# Patient Record
Sex: Female | Born: 1955
Health system: Southern US, Community
[De-identification: ages and names within clinical notes are randomized; demographics above are authoritative.]

## PROBLEM LIST (undated history)

## (undated) DIAGNOSIS — I10 Essential (primary) hypertension: Secondary | ICD-10-CM

## (undated) DIAGNOSIS — K449 Diaphragmatic hernia without obstruction or gangrene: Secondary | ICD-10-CM

## (undated) DIAGNOSIS — K219 Gastro-esophageal reflux disease without esophagitis: Secondary | ICD-10-CM

## (undated) DIAGNOSIS — K760 Fatty (change of) liver, not elsewhere classified: Secondary | ICD-10-CM

## (undated) DIAGNOSIS — E559 Vitamin D deficiency, unspecified: Secondary | ICD-10-CM

## (undated) DIAGNOSIS — M069 Rheumatoid arthritis, unspecified: Secondary | ICD-10-CM

## (undated) DIAGNOSIS — N200 Calculus of kidney: Secondary | ICD-10-CM

## (undated) DIAGNOSIS — N189 Chronic kidney disease, unspecified: Secondary | ICD-10-CM

## (undated) DIAGNOSIS — E785 Hyperlipidemia, unspecified: Secondary | ICD-10-CM

## (undated) DIAGNOSIS — K2970 Gastritis, unspecified, without bleeding: Secondary | ICD-10-CM

## (undated) DIAGNOSIS — R12 Heartburn: Secondary | ICD-10-CM

## (undated) DIAGNOSIS — M2550 Pain in unspecified joint: Secondary | ICD-10-CM

## (undated) DIAGNOSIS — E119 Type 2 diabetes mellitus without complications: Secondary | ICD-10-CM

## (undated) DIAGNOSIS — K3184 Gastroparesis: Secondary | ICD-10-CM

## (undated) HISTORY — DX: Pain in unspecified joint: M25.50

## (undated) HISTORY — PX: TUBAL LIGATION: SHX77

## (undated) HISTORY — DX: Gastritis, unspecified, without bleeding: K29.70

## (undated) HISTORY — DX: Gastro-esophageal reflux disease without esophagitis: K21.9

## (undated) HISTORY — PX: UPPER GASTROINTESTINAL ENDOSCOPY: SHX188

## (undated) HISTORY — DX: Heartburn: R12

## (undated) HISTORY — DX: Diaphragmatic hernia without obstruction or gangrene: K44.9

## (undated) HISTORY — PX: CHOLECYSTECTOMY: SHX55

## (undated) HISTORY — DX: Rheumatoid arthritis, unspecified: M06.9

## (undated) HISTORY — DX: Type 2 diabetes mellitus without complications: E11.9

## (undated) HISTORY — DX: Gastroparesis: K31.84

## (undated) HISTORY — DX: Fatty (change of) liver, not elsewhere classified: K76.0

## (undated) HISTORY — DX: Hyperlipidemia, unspecified: E78.5

## (undated) HISTORY — DX: Vitamin D deficiency, unspecified: E55.9

## (undated) HISTORY — DX: Essential (primary) hypertension: I10

## (undated) HISTORY — DX: Calculus of kidney: N20.0

## (undated) HISTORY — PX: COLONOSCOPY: SHX174

---

## 1998-04-26 ENCOUNTER — Emergency Department (HOSPITAL_COMMUNITY): Admission: EM | Admit: 1998-04-26 | Discharge: 1998-04-26 | Payer: Self-pay | Admitting: Emergency Medicine

## 1998-06-20 ENCOUNTER — Emergency Department (HOSPITAL_COMMUNITY): Admission: EM | Admit: 1998-06-20 | Discharge: 1998-06-20 | Payer: Self-pay | Admitting: Emergency Medicine

## 1998-06-20 ENCOUNTER — Encounter: Payer: Self-pay | Admitting: Emergency Medicine

## 1998-11-07 ENCOUNTER — Emergency Department (HOSPITAL_COMMUNITY): Admission: EM | Admit: 1998-11-07 | Discharge: 1998-11-07 | Payer: Self-pay | Admitting: Internal Medicine

## 1998-12-04 ENCOUNTER — Other Ambulatory Visit: Admission: RE | Admit: 1998-12-04 | Discharge: 1998-12-04 | Payer: Self-pay | Admitting: *Deleted

## 1999-01-27 ENCOUNTER — Ambulatory Visit (HOSPITAL_COMMUNITY): Admission: RE | Admit: 1999-01-27 | Discharge: 1999-01-27 | Payer: Self-pay | Admitting: Gastroenterology

## 1999-03-14 ENCOUNTER — Emergency Department (HOSPITAL_COMMUNITY): Admission: EM | Admit: 1999-03-14 | Discharge: 1999-03-14 | Payer: Self-pay | Admitting: Emergency Medicine

## 1999-03-24 ENCOUNTER — Other Ambulatory Visit: Admission: RE | Admit: 1999-03-24 | Discharge: 1999-03-24 | Payer: Self-pay | Admitting: *Deleted

## 1999-04-23 ENCOUNTER — Encounter: Admission: RE | Admit: 1999-04-23 | Discharge: 1999-04-23 | Payer: Self-pay | Admitting: *Deleted

## 2000-04-25 ENCOUNTER — Encounter: Admission: RE | Admit: 2000-04-25 | Discharge: 2000-04-25 | Payer: Self-pay | Admitting: *Deleted

## 2000-08-03 ENCOUNTER — Encounter: Admission: RE | Admit: 2000-08-03 | Discharge: 2000-11-01 | Payer: Self-pay | Admitting: Internal Medicine

## 2000-08-18 ENCOUNTER — Other Ambulatory Visit: Admission: RE | Admit: 2000-08-18 | Discharge: 2000-08-18 | Payer: Self-pay | Admitting: *Deleted

## 2000-10-28 ENCOUNTER — Emergency Department (HOSPITAL_COMMUNITY): Admission: EM | Admit: 2000-10-28 | Discharge: 2000-10-28 | Payer: Self-pay | Admitting: Emergency Medicine

## 2000-10-28 ENCOUNTER — Encounter: Payer: Self-pay | Admitting: Emergency Medicine

## 2001-04-27 ENCOUNTER — Encounter: Admission: RE | Admit: 2001-04-27 | Discharge: 2001-04-27 | Payer: Self-pay | Admitting: *Deleted

## 2001-09-26 ENCOUNTER — Other Ambulatory Visit: Admission: RE | Admit: 2001-09-26 | Discharge: 2001-09-26 | Payer: Self-pay | Admitting: *Deleted

## 2001-10-30 ENCOUNTER — Ambulatory Visit (HOSPITAL_BASED_OUTPATIENT_CLINIC_OR_DEPARTMENT_OTHER): Admission: RE | Admit: 2001-10-30 | Discharge: 2001-10-30 | Payer: Self-pay | Admitting: *Deleted

## 2001-11-06 ENCOUNTER — Encounter (INDEPENDENT_AMBULATORY_CARE_PROVIDER_SITE_OTHER): Payer: Self-pay | Admitting: *Deleted

## 2001-11-06 ENCOUNTER — Ambulatory Visit (HOSPITAL_COMMUNITY): Admission: RE | Admit: 2001-11-06 | Discharge: 2001-11-06 | Payer: Self-pay | Admitting: *Deleted

## 2001-11-29 ENCOUNTER — Encounter: Payer: Self-pay | Admitting: Emergency Medicine

## 2001-11-29 ENCOUNTER — Emergency Department (HOSPITAL_COMMUNITY): Admission: EM | Admit: 2001-11-29 | Discharge: 2001-11-29 | Payer: Self-pay | Admitting: Emergency Medicine

## 2002-03-30 ENCOUNTER — Encounter: Admission: RE | Admit: 2002-03-30 | Discharge: 2002-03-30 | Payer: Self-pay | Admitting: Family Medicine

## 2002-06-07 ENCOUNTER — Ambulatory Visit (HOSPITAL_COMMUNITY): Admission: RE | Admit: 2002-06-07 | Discharge: 2002-06-07 | Payer: Self-pay | Admitting: *Deleted

## 2002-11-05 ENCOUNTER — Encounter (INDEPENDENT_AMBULATORY_CARE_PROVIDER_SITE_OTHER): Payer: Self-pay | Admitting: Specialist

## 2002-11-05 ENCOUNTER — Encounter: Payer: Self-pay | Admitting: Surgery

## 2002-11-05 ENCOUNTER — Observation Stay (HOSPITAL_COMMUNITY): Admission: RE | Admit: 2002-11-05 | Discharge: 2002-11-06 | Payer: Self-pay | Admitting: Surgery

## 2003-01-31 ENCOUNTER — Encounter: Admission: RE | Admit: 2003-01-31 | Discharge: 2003-01-31 | Payer: Self-pay | Admitting: Sports Medicine

## 2003-05-15 ENCOUNTER — Encounter: Admission: RE | Admit: 2003-05-15 | Discharge: 2003-05-15 | Payer: Self-pay | Admitting: Family Medicine

## 2003-05-17 ENCOUNTER — Emergency Department (HOSPITAL_COMMUNITY): Admission: EM | Admit: 2003-05-17 | Discharge: 2003-05-18 | Payer: Self-pay | Admitting: Emergency Medicine

## 2003-05-31 ENCOUNTER — Emergency Department (HOSPITAL_COMMUNITY): Admission: EM | Admit: 2003-05-31 | Discharge: 2003-06-01 | Payer: Self-pay | Admitting: Emergency Medicine

## 2003-08-08 ENCOUNTER — Ambulatory Visit (HOSPITAL_COMMUNITY): Admission: RE | Admit: 2003-08-08 | Discharge: 2003-08-08 | Payer: Self-pay | Admitting: *Deleted

## 2003-11-25 ENCOUNTER — Emergency Department (HOSPITAL_COMMUNITY): Admission: EM | Admit: 2003-11-25 | Discharge: 2003-11-25 | Payer: Self-pay | Admitting: Emergency Medicine

## 2003-12-18 ENCOUNTER — Encounter: Payer: Self-pay | Admitting: Gastroenterology

## 2004-10-07 ENCOUNTER — Ambulatory Visit (HOSPITAL_COMMUNITY): Admission: RE | Admit: 2004-10-07 | Discharge: 2004-10-07 | Payer: Self-pay | Admitting: *Deleted

## 2004-10-15 ENCOUNTER — Encounter: Admission: RE | Admit: 2004-10-15 | Discharge: 2004-10-15 | Payer: Self-pay | Admitting: *Deleted

## 2004-11-12 ENCOUNTER — Ambulatory Visit: Payer: Self-pay | Admitting: Sports Medicine

## 2005-06-20 ENCOUNTER — Encounter (INDEPENDENT_AMBULATORY_CARE_PROVIDER_SITE_OTHER): Payer: Self-pay | Admitting: *Deleted

## 2005-06-20 LAB — CONVERTED CEMR LAB

## 2005-09-07 ENCOUNTER — Ambulatory Visit: Payer: Self-pay | Admitting: Sports Medicine

## 2005-09-23 ENCOUNTER — Ambulatory Visit: Payer: Self-pay | Admitting: Gastroenterology

## 2005-10-04 ENCOUNTER — Ambulatory Visit: Payer: Self-pay | Admitting: Gastroenterology

## 2005-10-04 ENCOUNTER — Encounter (INDEPENDENT_AMBULATORY_CARE_PROVIDER_SITE_OTHER): Payer: Self-pay | Admitting: Specialist

## 2005-11-01 ENCOUNTER — Ambulatory Visit (HOSPITAL_COMMUNITY): Admission: RE | Admit: 2005-11-01 | Discharge: 2005-11-01 | Payer: Self-pay | Admitting: *Deleted

## 2005-12-10 ENCOUNTER — Ambulatory Visit: Payer: Self-pay | Admitting: Gastroenterology

## 2005-12-28 ENCOUNTER — Encounter: Payer: Self-pay | Admitting: Gastroenterology

## 2005-12-28 ENCOUNTER — Ambulatory Visit (HOSPITAL_COMMUNITY): Admission: RE | Admit: 2005-12-28 | Discharge: 2005-12-28 | Payer: Self-pay | Admitting: Gastroenterology

## 2006-03-25 ENCOUNTER — Encounter (HOSPITAL_COMMUNITY): Admission: RE | Admit: 2006-03-25 | Discharge: 2006-05-31 | Payer: Self-pay | Admitting: Cardiology

## 2006-04-11 ENCOUNTER — Ambulatory Visit (HOSPITAL_COMMUNITY): Admission: RE | Admit: 2006-04-11 | Discharge: 2006-04-11 | Payer: Self-pay | Admitting: Cardiology

## 2006-04-11 ENCOUNTER — Encounter: Admission: RE | Admit: 2006-04-11 | Discharge: 2006-04-11 | Payer: Self-pay | Admitting: Cardiology

## 2006-05-19 DIAGNOSIS — K219 Gastro-esophageal reflux disease without esophagitis: Secondary | ICD-10-CM | POA: Insufficient documentation

## 2006-05-19 DIAGNOSIS — N912 Amenorrhea, unspecified: Secondary | ICD-10-CM | POA: Insufficient documentation

## 2006-05-19 DIAGNOSIS — I10 Essential (primary) hypertension: Secondary | ICD-10-CM | POA: Insufficient documentation

## 2006-05-19 DIAGNOSIS — N182 Chronic kidney disease, stage 2 (mild): Secondary | ICD-10-CM | POA: Insufficient documentation

## 2006-05-19 DIAGNOSIS — E1169 Type 2 diabetes mellitus with other specified complication: Secondary | ICD-10-CM | POA: Insufficient documentation

## 2006-05-19 DIAGNOSIS — E785 Hyperlipidemia, unspecified: Secondary | ICD-10-CM

## 2006-05-19 DIAGNOSIS — E1122 Type 2 diabetes mellitus with diabetic chronic kidney disease: Secondary | ICD-10-CM | POA: Insufficient documentation

## 2006-05-19 DIAGNOSIS — E119 Type 2 diabetes mellitus without complications: Secondary | ICD-10-CM | POA: Insufficient documentation

## 2006-05-20 ENCOUNTER — Encounter (INDEPENDENT_AMBULATORY_CARE_PROVIDER_SITE_OTHER): Payer: Self-pay | Admitting: *Deleted

## 2006-11-07 ENCOUNTER — Ambulatory Visit (HOSPITAL_COMMUNITY): Admission: RE | Admit: 2006-11-07 | Discharge: 2006-11-07 | Payer: Self-pay | Admitting: *Deleted

## 2007-08-30 ENCOUNTER — Emergency Department (HOSPITAL_COMMUNITY): Admission: EM | Admit: 2007-08-30 | Discharge: 2007-08-31 | Payer: Self-pay | Admitting: Emergency Medicine

## 2007-09-04 ENCOUNTER — Emergency Department (HOSPITAL_COMMUNITY): Admission: EM | Admit: 2007-09-04 | Discharge: 2007-09-04 | Payer: Self-pay | Admitting: Emergency Medicine

## 2007-09-12 ENCOUNTER — Inpatient Hospital Stay (HOSPITAL_COMMUNITY): Admission: EM | Admit: 2007-09-12 | Discharge: 2007-09-15 | Payer: Self-pay | Admitting: Emergency Medicine

## 2007-09-12 ENCOUNTER — Encounter (INDEPENDENT_AMBULATORY_CARE_PROVIDER_SITE_OTHER): Payer: Self-pay | Admitting: Internal Medicine

## 2007-11-21 ENCOUNTER — Ambulatory Visit (HOSPITAL_COMMUNITY): Admission: RE | Admit: 2007-11-21 | Discharge: 2007-11-21 | Payer: Self-pay | Admitting: Obstetrics & Gynecology

## 2008-03-11 ENCOUNTER — Emergency Department (HOSPITAL_COMMUNITY): Admission: EM | Admit: 2008-03-11 | Discharge: 2008-03-11 | Payer: Self-pay | Admitting: Emergency Medicine

## 2008-03-17 ENCOUNTER — Emergency Department (HOSPITAL_COMMUNITY): Admission: EM | Admit: 2008-03-17 | Discharge: 2008-03-17 | Payer: Self-pay | Admitting: Emergency Medicine

## 2008-04-22 DIAGNOSIS — N200 Calculus of kidney: Secondary | ICD-10-CM | POA: Insufficient documentation

## 2008-04-23 ENCOUNTER — Ambulatory Visit: Payer: Self-pay | Admitting: Gastroenterology

## 2008-04-23 DIAGNOSIS — R112 Nausea with vomiting, unspecified: Secondary | ICD-10-CM | POA: Insufficient documentation

## 2008-04-23 DIAGNOSIS — R1013 Epigastric pain: Secondary | ICD-10-CM | POA: Insufficient documentation

## 2008-04-23 LAB — CONVERTED CEMR LAB
ALT: 65 units/L — ABNORMAL HIGH (ref 0–35)
AST: 54 units/L — ABNORMAL HIGH (ref 0–37)
Albumin: 3.8 g/dL (ref 3.5–5.2)
Alkaline Phosphatase: 94 units/L (ref 39–117)
Amylase: 64 units/L (ref 27–131)
BUN: 10 mg/dL (ref 6–23)
Basophils Absolute: 0 10*3/uL (ref 0.0–0.1)
Basophils Relative: 0.6 % (ref 0.0–3.0)
Bilirubin, Direct: 0.1 mg/dL (ref 0.0–0.3)
CO2: 30 meq/L (ref 19–32)
Calcium: 9.7 mg/dL (ref 8.4–10.5)
Chloride: 103 meq/L (ref 96–112)
Creatinine, Ser: 0.6 mg/dL (ref 0.4–1.2)
Eosinophils Absolute: 0.1 10*3/uL (ref 0.0–0.7)
Eosinophils Relative: 2.5 % (ref 0.0–5.0)
Ferritin: 65.1 ng/mL (ref 10.0–291.0)
Folate: 11.3 ng/mL
GFR calc Af Amer: 134 mL/min
GFR calc non Af Amer: 111 mL/min
Glucose, Bld: 226 mg/dL — ABNORMAL HIGH (ref 70–99)
HCT: 35.9 % — ABNORMAL LOW (ref 36.0–46.0)
Hemoglobin: 12.1 g/dL (ref 12.0–15.0)
Hgb A1c MFr Bld: 9.1 % — ABNORMAL HIGH (ref 4.6–6.0)
Iron: 84 ug/dL (ref 42–145)
Lipase: 38 units/L (ref 11.0–59.0)
Lymphocytes Relative: 44.7 % (ref 12.0–46.0)
MCHC: 33.6 g/dL (ref 30.0–36.0)
MCV: 88.1 fL (ref 78.0–100.0)
Monocytes Absolute: 0.4 10*3/uL (ref 0.1–1.0)
Monocytes Relative: 10.2 % (ref 3.0–12.0)
Neutro Abs: 1.8 10*3/uL (ref 1.4–7.7)
Neutrophils Relative %: 42 % — ABNORMAL LOW (ref 43.0–77.0)
Platelets: 277 10*3/uL (ref 150–400)
Potassium: 4.2 meq/L (ref 3.5–5.1)
RBC: 4.07 M/uL (ref 3.87–5.11)
RDW: 13.8 % (ref 11.5–14.6)
Saturation Ratios: 18.7 % — ABNORMAL LOW (ref 20.0–50.0)
Sodium: 139 meq/L (ref 135–145)
TSH: 1.2 microintl units/mL (ref 0.35–5.50)
Total Bilirubin: 0.4 mg/dL (ref 0.3–1.2)
Total Protein: 7.4 g/dL (ref 6.0–8.3)
Transferrin: 321.5 mg/dL (ref >=212.0)
Vitamin B-12: 493 pg/mL (ref 211–911)
WBC: 4.2 10*3/uL — ABNORMAL LOW (ref 4.5–10.5)

## 2008-04-29 ENCOUNTER — Ambulatory Visit (HOSPITAL_COMMUNITY): Admission: RE | Admit: 2008-04-29 | Discharge: 2008-04-29 | Payer: Self-pay | Admitting: Gastroenterology

## 2008-05-07 ENCOUNTER — Ambulatory Visit: Payer: Self-pay | Admitting: Gastroenterology

## 2008-05-07 DIAGNOSIS — K7689 Other specified diseases of liver: Secondary | ICD-10-CM | POA: Insufficient documentation

## 2008-05-11 ENCOUNTER — Emergency Department (HOSPITAL_COMMUNITY): Admission: EM | Admit: 2008-05-11 | Discharge: 2008-05-11 | Payer: Self-pay | Admitting: Emergency Medicine

## 2008-08-20 ENCOUNTER — Ambulatory Visit: Payer: Self-pay | Admitting: Gastroenterology

## 2008-08-21 ENCOUNTER — Telehealth: Payer: Self-pay | Admitting: Gastroenterology

## 2008-08-27 ENCOUNTER — Ambulatory Visit: Payer: Self-pay | Admitting: Gastroenterology

## 2008-08-27 DIAGNOSIS — K5900 Constipation, unspecified: Secondary | ICD-10-CM | POA: Insufficient documentation

## 2008-12-10 ENCOUNTER — Ambulatory Visit (HOSPITAL_COMMUNITY): Admission: RE | Admit: 2008-12-10 | Discharge: 2008-12-10 | Payer: Self-pay | Admitting: Internal Medicine

## 2008-12-17 ENCOUNTER — Encounter: Admission: RE | Admit: 2008-12-17 | Discharge: 2008-12-17 | Payer: Self-pay | Admitting: Internal Medicine

## 2009-04-27 ENCOUNTER — Emergency Department (HOSPITAL_COMMUNITY): Admission: EM | Admit: 2009-04-27 | Discharge: 2009-04-28 | Payer: Self-pay | Admitting: Emergency Medicine

## 2010-03-26 ENCOUNTER — Ambulatory Visit (HOSPITAL_COMMUNITY)
Admission: RE | Admit: 2010-03-26 | Discharge: 2010-03-26 | Payer: Self-pay | Source: Home / Self Care | Attending: Internal Medicine | Admitting: Internal Medicine

## 2010-04-12 ENCOUNTER — Encounter: Payer: Self-pay | Admitting: Cardiology

## 2010-04-21 NOTE — Procedures (Signed)
Summary: Colon   Colonoscopy  Procedure date:  12/18/2003  Findings:      Location:  [facility].    Colonoscopy  Procedure date:  12/18/2003  Findings:      Location:  [facility].    Patient Name: Leslie Davis, Leslie Davis. MRN:  Procedure Procedures: Colonoscopy CPT: [phone].  Personnel: Endoscopist: Vania Rea. Jarold Motto, MD.  Exam Location: Exam performed in Outpatient Clinic. Outpatient  Patient Consent: Procedure, Alternatives, Risks and Benefits discussed, consent obtained, from patient. Consent was obtained by the RN.  Indications  Average Risk Screening Routine.  History  Current Medications: Patient is not currently taking Coumadin.  Pre-Exam Physical: Performed Dec 18, 2003. Cardio-pulmonary exam, Rectal exam, Abdominal exam, Extremity exam, Mental status exam WNL.  Exam Exam: Extent of exam reached: Cecum, extent intended: Cecum.  The cecum was identified by appendiceal orifice and IC valve. Patient position: on left side. Duration of exam: 20 minutes. Colon retroflexion performed. Images taken. ASA Classification: I. Tolerance: excellent.  Monitoring: Pulse and BP monitoring, Oximetry used. Supplemental O2 given. at 2 Liters.  Colon Prep Used Golytely for colon prep. Prep results: excellent.  Sedation Meds: Patient assessed and found to be appropriate for moderate (conscious) sedation. Fentanyl 50 mcg. given IV. Versed 5 mg. given IV.  Instrument(s): CF 140L. Serial [id number].  Findings - NORMAL EXAM: Cecum to Rectum. Not Seen: Polyps. AVM's. Colitis. Tumors. Melanosis. Crohn's. Diverticulosis. Hemorrhoids.   Assessment Normal examination.  Events  Unplanned Interventions: No intervention was required.  Plans Medication Plan: Continue current medications.  Patient Education: Patient given standard instructions for: a normal exam.  Disposition: After procedure patient sent to recovery. After recovery patient sent  home.  Scheduling/Referral: Follow-Up prn.    CC: Guerry Bruin, MD  This report was created from the original endoscopy report, which was reviewed and signed by the above listed endoscopist.

## 2010-04-21 NOTE — Assessment & Plan Note (Signed)
Summary: constipation,probs w stomach...em   History of Present Illness Visit Type: follow up Primary GI MD: Sheryn Bison MD FACP FAGA Primary Provider: Velna Hatchet, MD Chief Complaint: Constipation with lower abd cramping x 3 weeks. History of Present Illness:   This patient is a 55 year old African American female with non-insulin-dependent diabetes who complains of abdominal gas, bloating, and new onset constipation. She also has chronic epigastric postprandial discomfort and has had negative endoscopic exams and recently a normal gastric emptying scan. She is status post cholecystectomy. She denies any specific hepatobiliary complaints.  The last 3 weeks she's had gas, bloating, and hard nonbloody stools. She denies any new medications, worsening diabetes, use of narcotics, or change in her diet. She does have chronic GERD and is on chronic AcipHex. 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   GI Review of Systems    Reports belching and  nausea.      Denies abdominal pain, acid reflux, bloating, chest pain, dysphagia with liquids, dysphagia with solids, heartburn, loss of appetite, vomiting, vomiting blood, weight loss, and  weight gain.      Reports constipation.     Denies anal fissure, black tarry stools, change in bowel habit, diarrhea, diverticulosis, fecal incontinence, heme positive stool, hemorrhoids, irritable bowel syndrome, jaundice, light color stool, liver problems, rectal bleeding, and  rectal pain.    Current Medications (verified): 1)  Actos + Metformin 15/850 Mg .... Take 1 Tablet By Mouth Two Times A Day 2)  Aspirin 81 Mg Tbec (Aspirin) .Marland Kitchen.. 1 Tablet By Mouth Once Daily 3)  Januvia 100 Mg Tabs (Sitagliptin Phosphate) .... Take 1 Tablet By Mouth Once A Day 4)  Benicar Hct 40-25 Mg Tabs (Olmesartan  Medoxomil-Hctz) .... Take 1 Tablet By Mouth Once A Day 5)  Aciphex 20 Mg  Tbec (Rabeprazole Sodium) .... Take 1 Each Day 30 Minutes Before Meals  Allergies (verified): No Known Drug Allergies  Past History:  Past medical, surgical, family and social histories (including risk factors) reviewed for relevance to current acute and chronic problems.  Past Medical History: Current Problems:  CONSTIPATION (ICD-564.00) FATTY LIVER DISEASE (ICD-571.8) GASTROPARESIS (ICD-536.3) EPIGASTRIC PAIN (ICD-789.06) NAUSEA (ICD-787.02) RENAL CALCULUS (ICD-592.0) OTHER DYSPHAGIA (ICD-787.29) GASTRITIS (ICD-535.50) HYPERTENSION, BENIGN SYSTEMIC (ICD-401.1) HYPERLIPIDEMIA (ICD-272.4) GASTROESOPHAGEAL REFLUX, NO ESOPHAGITIS (ICD-530.81) DIABETES MELLITUS II, UNCOMPLICATED (ICD-250.00) AMENORRHEA (ICD-626.0)  Past Surgical History: Reviewed history from 05/07/2008 and no changes required. She is a previous cholecystectomy for cholelithiasis.  Family History: Reviewed history from 04/23/2008 and no changes required. No FH of Colon Cancer: Family History of Diabetes: Mother, Grandmother Family History of Stomach Cancer: Maternal Aunts x 2  Social History: Reviewed history from 05/07/2008 and no changes required. married with two sons Alcohol Use - no Illicit Drug Use - no Patient is a former smoker. -smoked for less than 1 year at age 64 Patient does not get regular exercise.  Occupation: Housekeeping  Review of Systems       The patient complains of fatigue.  The patient denies allergy/sinus, anemia, anxiety-new, arthritis/joint pain, back pain, blood in urine, breast changes/lumps, change in vision, confusion, cough, coughing up blood, depression-new, fainting, fever, headaches-new, hearing problems, heart murmur, heart rhythm changes, itching, menstrual pain, muscle pains/cramps, night sweats, nosebleeds, pregnancy symptoms, shortness of breath, skin rash, sleeping problems, sore throat,  swelling of feet/legs, swollen lymph glands, thirst - excessive ,  urination - excessive , urination changes/pain, urine leakage, vision changes, and voice change.    Vital Signs:  Patient profile:   55 year old female Height:      64 inches Weight:      197 pounds BMI:     33.94 BSA:     1.95 Pulse rate:   70 / minute Pulse rhythm:   regular BP sitting:   128 / 64  (right arm) Cuff size:   regular  Vitals Entered By: Ok Anis CMA (August 27, 2008 3:10 PM)  Physical Exam  General:  Well developed, well nourished, no acute distress.obese.   Head:  Normocephalic and atraumatic. Eyes:  PERRLA, no icterus.exam deferred to patient's ophthalmologist.   Neck:  Supple; no masses or thyromegaly. Lungs:  Clear throughout to auscultation. Heart:  Regular rate and rhythm; no murmurs, rubs,  or bruits. Abdomen:  Soft, nontender and nondistended. No masses, hepatosplenomegaly or hernias noted. Normal bowel sounds.obese.   Rectal:  Normal exam.hemocult negative.  There is no evidence of an impaction, stool is normal color and guaiac-negative. Extremities:  No clubbing, cyanosis, edema or deformities noted. Neurologic:  Alert and  oriented x4;  grossly normal neurologically. Cervical Nodes:  No significant cervical adenopathy. Inguinal Nodes:  No significant inguinal adenopathy. Psych:  Alert and cooperative. Normal mood and affect.   Impression & Recommendations:  Problem # 1:  CONSTIPATION (ICD-564.00) Assessment New This problem allegedly has occurred in the last 3-4 weeks, etiology unclear. She does complain of some numbness and tingling in her hands and feet but otherwise has no known diabetic complications. I suspect she has standard functional constipation related to lack of fiber and fluids in her diet. I have asked her to try Amitiza 24 micrograms twice a day with MiraLax 8 ounces at bedtime as needed. She is up-to-date on colonoscopy exams. I see no need to repeat her labs at this  point. She is to call for progress report in one week's time.  Problem # 2:  FATTY LIVER DISEASE (ICD-571.8) Assessment: Unchanged Attempts at weight loss reduction had been unsuccessful. We will check her liver function tests at 6 month intervals. She has no evidence of cirrhosis at this time. 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 She is status post cholecystectomy for cholelithiasis. Recent gastric emptying scan was normal.  Problem # 4:  NAUSEA (ICD-787.02) Assessment: Unchanged Probably related to her metabolic syndrome, obesity, and Nash syndrome.  Problem # 5:  DIABETES MELLITUS II, UNCOMPLICATED (ICD-250.00) Assessment: Unchanged continue meds per primary care physician Dr. Skip Mayer.  Patient Instructions: 1)  Copy sent to : Dr. Skip Mayer 2)  Trial of Amitiza 24 micrograms twice a day 3)  Diet should be high in fiber ( fruits, vegetables, whole grains) but low in residue. Drink at least eight (8) glasses of water a day.  4)  MiraLax 8 ounces at bedtime as needed 5)  Please continue current medications.  6)  Please call our GI Office back in 2 weeks with a follow-up of symptoms.  Appended Document: constipation,probs w stomach...em    Clinical Lists Changes  Medications: Added new medication of AMITIZA 24 MCG  CAPS (LUBIPROSTONE) 1 two times a day/take with food and water Added new medication of MIRALAX   POWD (POLYETHYLENE GLYCOL 3350) take one capful in 8 ounces of water at bedtime

## 2010-04-21 NOTE — Progress Notes (Signed)
Summary: constipation  Phone Note Call from Patient Call back at [phone]   Caller: Patient Call For: Dr. Jarold Motto Reason for Call: Talk to Nurse Details for Reason: constipation Summary of Call: pt reporting constipation for about 2 weeks and would like to know what else she can do other than otc meds Initial call taken by: Vallarie Mare,  August 21, 2008 2:27 PM  Follow-up for Phone Call        left message with female who thought she had an appt today, i asked for him to have her call me back. Follow-up by: Harlow Mares CMA,  August 21, 2008 2:31 PM  Additional Follow-up for Phone Call Additional follow up Details #1::        Left message on patients machine to call back.  advised patient husband if she is stil having problems to have her call our office back. Additional Follow-up by: Harlow Mares CMA,  August 23, 2008 8:32 AM

## 2010-04-21 NOTE — Assessment & Plan Note (Signed)
Summary: NAUSEA...AS.   History of Present Illness Visit Type: follow up Primary GI MD: Sheryn Bison MD Faith Rogue Primary Provider: Allyne Gee Chief Complaint: Patient here for further evaluation of several months nausea.  Patient states that she is nauseated on a daily basis.  Nausea occurs frequently throughout the day but on an intermittent basis.  Patient denies any vomiting or fever.  She also denies any dysphagia or odyophagia, no heartburn.  Patient does c/o some epigastric pain at times.  She also notes that she has had some occasional dark black stool History of Present Illness:   This patient is a 55 year old African American female with adult onset non-insulin-dependent diabetes referred by Dr. Allyne Gee for evaluation of chronic nausea.  I have seen this patient for many years and she has chronic nausea, which has responded in the past to PPI therapy for acid control. She is status post cholecystectomy for cholelithiasis. She's had gastric emptying scans which have been normal and not confirmed gastroparesis. However, she currently continues with early satiety, nausea, and right upper quadrant pain. She denies emesis, reflux symptoms, or dysphasia or any specific hepatobiliary or lower bowel complaints. She denies abuse of NSAIDs, alcohol, or cigarettes. Theer has been no anorexia, weight loss, fever or chills. She is not on PPI therapy at this time. Last endoscopic exam was in July 2007 and colonoscopy in 2005.     GI Review of Systems    Reports abdominal pain and  nausea.     Location of  Abdominal pain: RUQ.    Denies acid reflux, belching, bloating, chest pain, dysphagia with liquids, dysphagia with solids, heartburn, loss of appetite, vomiting, vomiting blood, weight loss, and  weight gain.        Denies anal fissure, black tarry stools, change in bowel habit, constipation, diarrhea, diverticulosis, fecal incontinence, heme positive stool, hemorrhoids, irritable bowel syndrome,  jaundice, light color stool, liver problems, rectal bleeding, and  rectal pain.     Prior Medications Reviewed Using: List Brought by Patient  Updated Prior Medication List: * ACTOS + METFORMIN 15/850 MG Take 1 tablet by mouth two times a day ALTACE 10 MG CAPS (RAMIPRIL) 1 capsule by mouth once a day BUFPIRIN 325 MG TABS (ASPIRIN BUFFERED) take one by mouth once daily JANUVIA 100 MG TABS (SITAGLIPTIN PHOSPHATE) Take 1 tablet by mouth once a day BENICAR HCT 40-25 MG TABS (OLMESARTAN MEDOXOMIL-HCTZ) Take 1 tablet by mouth once a day SIMVASTATIN 40 MG TABS (SIMVASTATIN) Take 1 tablet by mouth once a day  Current Allergies (reviewed today): No known allergies   Past Medical History:    Reviewed history and no changes required:       Current Problems:        RENAL CALCULUS (ICD-592.0)       OTHER DYSPHAGIA (ICD-787.29)       GASTRITIS (ICD-535.50)       HYPERTENSION, BENIGN SYSTEMIC (ICD-401.1)       HYPERLIPIDEMIA (ICD-272.4)       GASTROESOPHAGEAL REFLUX, NO ESOPHAGITIS (ICD-530.81)       DIABETES MELLITUS II, UNCOMPLICATED (ICD-250.00)       AMENORRHEA (ICD-626.0)         Past Surgical History:    Reviewed history and no changes required:       None   Family History:    Reviewed history from 04/22/2008 and no changes required:       No FH of Colon Cancer:       Family History of Diabetes:  Mother, Grandmother       Family History of Stomach Cancer: Maternal Aunts x 2  Social History:    Reviewed history from 04/22/2008 and no changes required:       married with two sons       Alcohol Use - no       Illicit Drug Use - no       Patient is a former smoker. -smoked for less than 1 year at age 40       Patient does not get regular exercise.    Risk Factors:  Tobacco use:  quit Exercise:  no    Vital Signs:  Patient Profile:   55 Years Old Female Height:     64 inches Weight:      194.38 pounds BMI:     33.49 BSA:     1.93 Pulse rate:   72 / minute Pulse  rhythm:   regular BP sitting:   160 / 70  (left arm)  Vitals Entered By: Hortense Ramal CMA (April 23, 2008 9:43 AM)                  Physical Exam  General:     Well developed, well nourished, no acute distress.healthy appearing.   Head:     Normocephalic and atraumatic. Eyes:     PERRLA, no icterus.exam deferred to patient's ophthalmologist.   Lungs:     Clear throughout to auscultation. Heart:     Regular rate and rhythm; no murmurs, rubs,  or bruits. Abdomen:     Soft, nontender and nondistended. No masses, hepatosplenomegaly or hernias noted. Normal bowel sounds.There is a secussion splash noted in the epigastric area with rather vigorous shaking of her upper body. Exam shows an enlarged liver with a tender edge in the subxiphoid area reproduce her abdominal pain. There is no splenomegaly, other abdominal masses or tenderness. Bowel sounds otherwise are normal. Extremities:     No clubbing, cyanosis, edema or deformities noted. Neurologic:     Alert and  oriented x4;  grossly normal neurologically. Psych:     Alert and cooperative. Normal mood and affect.    Impression & Recommendations:  Problem # 1:  NAUSEA (ICD-787.02) Assessment: Unchanged She has had chronic nausea for many years probably related to low grade gastroparesis associated with her diabetes. However, I am concerned about her tender hepatomegaly which is probably related to NASH syndrome associated with her diabetes and metabolic syndrome. We will repeat her labs and schedule upper abdominal ultrasound followup and place her on a step 3 gastroparesis diet. I will see her back in 2 weeks time for a followup. I have started her on  AcipHex 20 mg a day before breakfast since this is helped in the past. We may need to repeat her emptying scan or give her a trial of low-dose prokinetics depending on her work up and clinical course. Orders: Ultrasound Abdomen (UAS) TLB-CBC Platelet - w/Differential  (85025-CBCD) TLB-BMP (Basic Metabolic Panel-BMET) (80048-METABOL) TLB-Hepatic/Liver Function Pnl (80076-HEPATIC) TLB-TSH (Thyroid Stimulating Hormone) (84443-TSH) TLB-B12, Serum-Total ONLY (98119-J47) TLB-Ferritin (82728-FER) TLB-Folic Acid (Folate) (82746-FOL) TLB-IBC Pnl (Iron/FE;Transferrin) (83550-IBC) TLB-Amylase (82150-AMYL) TLB-Lipase (83690-LIPASE) TLB-A1C / Hgb A1C (Glycohemoglobin) (83036-A1C)   Problem # 2:  EPIGASTRIC PAIN (ICD-789.06) Assessment: Deteriorated As per above.  Orders: Ultrasound Abdomen (UAS) TLB-CBC Platelet - w/Differential (85025-CBCD) TLB-BMP (Basic Metabolic Panel-BMET) (80048-METABOL) TLB-Hepatic/Liver Function Pnl (80076-HEPATIC) TLB-TSH (Thyroid Stimulating Hormone) (84443-TSH) TLB-B12, Serum-Total ONLY (82956-O13) TLB-Ferritin (82728-FER) TLB-Folic Acid (Folate) (82746-FOL) TLB-IBC Pnl (Iron/FE;Transferrin) (83550-IBC)  TLB-Amylase (82150-AMYL) TLB-Lipase (83690-LIPASE) TLB-A1C / Hgb A1C (Glycohemoglobin) (83036-A1C)   Problem # 3:  DIABETES MELLITUS II, UNCOMPLICATED (ICD-250.00) Assessment: Unchanged Continue meds per Dr. Allyne Gee Orders: TLB-CBC Platelet - w/Differential (85025-CBCD) TLB-BMP (Basic Metabolic Panel-BMET) (80048-METABOL) TLB-Hepatic/Liver Function Pnl (80076-HEPATIC) TLB-TSH (Thyroid Stimulating Hormone) (84443-TSH) TLB-B12, Serum-Total ONLY (45409-W11) TLB-Ferritin (82728-FER) TLB-Folic Acid (Folate) (82746-FOL) TLB-IBC Pnl (Iron/FE;Transferrin) (83550-IBC) TLB-Amylase (82150-AMYL) TLB-Lipase (83690-LIPASE) TLB-A1C / Hgb A1C (Glycohemoglobin) (83036-A1C)    Patient Instructions: 1)  Copy Sent To:Dr. Allyne Gee 2)  Please Continue current medications. 3)  AcipHex 20 mg a day 4)  Gastroparesis diet step 3.  5)  Please schedule a follow-up appointment in 2 weeks. 6)  Glo Herring sent to lab today.     Prescriptions: ACIPHEX 20 MG  TBEC (RABEPRAZOLE SODIUM) Take 1 each day 30 minutes before meals  #30 x 3    Entered by:   Harlow Mares CMA   Authorized by:   Mardella Layman MD FACG,FAGA   Signed by:   Harlow Mares CMA on 04/23/2008   Method used:   Electronically to        [facility]  [phone]* (retail)       [address]       [place], [place]  [address]       Ph: [phone] or [phone]       Fax: [phone]   RxID:   [id number]

## 2010-04-21 NOTE — Procedures (Signed)
Summary: EGD and Pathology   EGD  Procedure date:  10/04/2005  Findings:      Location: [facility]    EGD  Procedure date:  10/04/2005  Findings:      Location: [facility]    Patient Name: Leslie, Davis. MRN:  Procedure Procedures: Panendoscopy (EGD) CPT: 43235.    with esophageal dilation. CPT: [id number].  Personnel: Endoscopist: Vania Rea. Jarold Motto, MD.  Exam Location: Exam performed in Outpatient Clinic. Outpatient  Patient Consent: Procedure, Alternatives, Risks and Benefits discussed, consent obtained, from patient. Consent was obtained by the RN.  Indications Symptoms: Dysphagia. Abdominal pain, location: epigastric. Reflux symptoms  History  Current Medications: Patient is not currently taking Coumadin.  Medical/Surgical History: Adult Onset Diabetes, On insulin Rx. Hypertension,  Comments: S/P cholecystectomy for gallstones.. Pre-Exam Physical: Performed Oct 04, 2005  Cardio-pulmonary exam, Abdominal exam, Extremity exam, Mental status exam WNL.  Comments: Pt. history reviewed/updated, physical exam performed prior to initiation of sedation? Exam Exam Info: Maximum depth of insertion Duodenum, intended Duodenum. Patient position: on left side. Duration of exam: 10 minutes. Vocal cords visualized. Gastric retroflexion performed. Images taken. ASA Classification: II. Tolerance: excellent.  Sedation Meds: Patient assessed and found to be appropriate for moderate (conscious) sedation. Fentanyl 25 mcg. given IV. Versed 4 mg. given IV. Cetacaine Spray 2 sprays given aerosolized.  Monitoring: BP and pulse monitoring done. Oximetry used. Supplemental O2 given at 2 Liters.  Instrument(s): GIF 160. Serial [id number].   Findings - Normal: Proximal Esophagus to Distal Esophagus. Not Seen: Tumor. Barrett's esophagus. Esophageal inflammation. Mucosal abnormality. Stricture. Varices.  - Dilation: Proximal Esophagus. for  dysphagia without stricture. Maloney dilator used, Diameter: 58 F, No Resistance, No Heme present on extraction. 1  total dilators used. Patient tolerance excellent. Outcome: successful.  - MUCOSAL ABNORMALITY: Cardia to Antrum. Nodularity present. Erythematous mucosa. Red spots present. Granular mucosa. Biopsy/Mucosal Abn taken. ICD9: Gastritis, Unspecified: 535.50.  - Normal: Antrum to Duodenal 2nd Portion. Not Seen: Ulcer. Mucosal abnormality. AVM's. Foreign body.   Assessment  Diagnoses: 535.50: Gastritis, Unspecified. R/O H.pylori.   Comments: ??? occult stricture dilated. Events  Unplanned Intervention: No unplanned interventions were required.  Plans Medication(s): Await pathology. Continue current medications.  Disposition: After procedure patient sent to recovery. After recovery patient sent home.  Scheduling: Await pathology to schedule patient. Follow-up prn.    CC: Guerry Bruin, MD  This report was created from the original endoscopy report, which was reviewed and signed by the above listed endoscopist.    [facility] Surgical Pathology - STATUS: Final             By: SMIR MD , Jessica Priest           Perform Date: 16Jul07 00:01  Ordered By: Jarold Motto MD , Areil Ottey R        Ordered Date: 17Jul07 14:11  Facility: [facility]                               Department: CPATH  Service Report Text  [facility]   [address] [address]   [place], [place] [address]   Telephone [phone] or [phone] Fax [phone]    REPORT OF SURGICAL PATHOLOGY    Case #: [id number]   Patient Name: Leslie Davis, Leslie Davis.   Office Chart Number: [id number]    MRN: [id number]   Pathologist: Havery Moros, MD   DOB[birth year]Age [birth year] (Age: 55) Gender: F  Date Taken: 10/04/2005   Date Received: 10/05/2005    FINAL DIAGNOSIS    ***MICROSCOPIC EXAMINATION AND DIAGNOSIS***    STOMACH, BIOPSY: BENIGN GASTRIC MUCOSA WITH CHRONIC GASTRITIS.   NO INTESTINAL METAPLASIA OR  MALIGNANCY IDENTIFIED.    COMMENT   A Warthin-Starry stain is performed to determine the possibility   of the presence of Helicobacter pylori. The Warthin-Starry stain   is negative for organisms of Helicobacter pylori. The control(s)   stained appropriately. (BNS:caf 10/06/05)    cf   Date Reported: 10/06/2005 Havery Moros, MD   *** Electronically Signed Out By BNS ***    Clinical information   Chronic gastritis R/O H. pylori (gt)    specimen(s) obtained   Stomach, biopsy    Gross Description   Received in formalin are tan, soft tissue fragments that are   submitted in toto. Number: 3   Size: 0.3 to 0.4 cm one block (TB:jes,10/05/05)    jes/

## 2010-04-21 NOTE — Assessment & Plan Note (Signed)
Summary: 2 WEEK RECHECK/LD   History of Present Illness Visit Type: follow up Primary GI MD: Sheryn Bison MD FACP FAGA Primary Provider: Velna Hatchet, MD Chief Complaint: 2 wk follow-up visit nausea/Abdominal US History of Present Illness:   This patient has much improved on daily AcipHex and a gastroparesis diet. 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 Her ultrasound showed an enlarged echodense liver, and she is status post cholecystectomy. She has rather poor control of her diabetes with a hemoglobin A1c of 9.1. She apparently has been on statin medication for 2 years. I do not have these records for review. Her weight is varies between 160 --190 pounds.   GI Review of Systems      Denies abdominal pain, acid reflux, belching, bloating, chest pain, dysphagia with liquids, dysphagia with solids, heartburn, loss of appetite, nausea, vomiting, vomiting blood, weight loss, and  weight gain.        Denies anal fissure, black tarry stools, change in bowel habit, constipation, diarrhea, diverticulosis, fecal incontinence, heme positive stool, hemorrhoids, irritable bowel syndrome, jaundice, light color stool, liver problems, rectal bleeding, and  rectal pain.   Updated Prior Medication List: * ACTOS + METFORMIN 15/850 MG Take 1 tablet by mouth two times a day ASPIRIN 81 MG TBEC (ASPIRIN) 1 tablet by mouth once daily JANUVIA 100 MG TABS (SITAGLIPTIN PHOSPHATE) Take 1 tablet by mouth once a day BENICAR HCT 40-25 MG TABS (OLMESARTAN MEDOXOMIL-HCTZ) Take 1 tablet by mouth once a day SIMVASTATIN 40 MG TABS (SIMVASTATIN) Take 1 tablet by mouth once a day ACIPHEX 20 MG  TBEC (RABEPRAZOLE SODIUM) Take 1 each day 30 minutes before meals  Current Allergies (reviewed today): No known allergies  Past Medical History:    Reviewed history from 04/23/2008 and no changes required:       Current Problems:        RENAL CALCULUS  (ICD-592.0)       OTHER DYSPHAGIA (ICD-787.29)       GASTRITIS (ICD-535.50)       HYPERTENSION, BENIGN SYSTEMIC (ICD-401.1)       HYPERLIPIDEMIA (ICD-272.4)       GASTROESOPHAGEAL REFLUX, NO ESOPHAGITIS (ICD-530.81)       DIABETES MELLITUS II, UNCOMPLICATED (ICD-250.00)       AMENORRHEA (ICD-626.0)         Past Surgical History:    Reviewed history from 04/23/2008 and no changes required:       She is a previous cholecystectomy for cholelithiasis.   Family History:    Reviewed history from 04/23/2008 and no changes required:       No FH of Colon Cancer:       Family History of Diabetes: Mother, Grandmother       Family History of Stomach Cancer: Maternal Aunts x 2  Social History:    Reviewed history from 04/23/2008 and no changes required:       married with two sons       Alcohol Use - no       Illicit Drug Use - no       Patient is a former smoker. -smoked for less than 1 year at age 88       Patient does not get regular exercise.        Occupation: Housekeeping  Vital Signs:  Patient Profile:   55 Years Old Female Height:     64 inches Weight:  194.50 pounds Pulse rate:   68 / minute Pulse rhythm:   regular BP sitting:   130 / 72  (left arm)  Vitals Entered By: Milford Cage CMA (May 07, 2008 8:55 AM)                  Physical Exam  General:     Well developed, well nourished, no acute distress.healthy appearing.  healthy appearing.   Psych:     Alert and cooperative. Normal mood and affect.   Impression & Recommendations:  Problem # 1:  GASTROPARESIS (ICD-536.3) Assessment: Improved She is greatly improved on a step 3 gastroparesis diet and daily AcipHex. We'll continue this regime, and at this time I have not added Reglan; She needs better control of her diabetes.  I will send these reports to Dr. Velna Hatchet.  Problem # 2:  FATTY LIVER DISEASE (ICD-571.8) Assessment: Deteriorated she appears to have possible NASH syndrome as part of her  metabolic syndrome. I have stopped her statin medications. I would recommend repeating her liver profile when she sees her primary care physician in a month. She may need liver biopsy depending on her clinical course and her laboratory parameters. Also ideally a 15-20 pound weight loss would help all of her problems tremendously.  Problem # 3:  EPIGASTRIC PAIN (ICD-789.06) Assessment: Improved   Patient Instructions: 1)  Copy Sent To:Dr. Evonnie Pat 2)  Please Continue current medications. 3)  Hold statin meds. 4)  Liver enzymes in one month her primary care with copy to GI.

## 2010-05-25 ENCOUNTER — Other Ambulatory Visit (HOSPITAL_COMMUNITY): Payer: Self-pay | Admitting: Obstetrics

## 2010-05-25 DIAGNOSIS — E2839 Other primary ovarian failure: Secondary | ICD-10-CM

## 2010-05-28 ENCOUNTER — Ambulatory Visit (HOSPITAL_COMMUNITY)
Admission: RE | Admit: 2010-05-28 | Discharge: 2010-05-28 | Disposition: A | Discharge status: Home / Self Care | Payer: 59 | Source: Ambulatory Visit | Attending: Obstetrics | Admitting: Obstetrics

## 2010-05-28 DIAGNOSIS — Z13820 Encounter for screening for osteoporosis: Secondary | ICD-10-CM | POA: Insufficient documentation

## 2010-05-28 DIAGNOSIS — E2839 Other primary ovarian failure: Secondary | ICD-10-CM

## 2010-06-12 LAB — CBC
HCT: 34 % — ABNORMAL LOW (ref 36.0–46.0)
Hemoglobin: 11.3 g/dL — ABNORMAL LOW (ref 12.0–15.0)
MCHC: 33.4 g/dL (ref 30.0–36.0)
MCV: 89.2 fL (ref 78.0–100.0)
Platelets: 296 10*3/uL (ref 150–400)
RBC: 3.81 MIL/uL — ABNORMAL LOW (ref 3.87–5.11)
RDW: 14.7 % (ref 11.5–15.5)
WBC: 8.7 10*3/uL (ref 4.0–10.5)

## 2010-06-12 LAB — TROPONIN I: Troponin I: 0.01 ng/mL (ref 0.00–0.06)

## 2010-06-12 LAB — BASIC METABOLIC PANEL
BUN: 11 mg/dL (ref 6–23)
CO2: 23 mEq/L (ref 19–32)
Calcium: 9.3 mg/dL (ref 8.4–10.5)
Chloride: 106 mEq/L (ref 96–112)
Creatinine, Ser: 0.67 mg/dL (ref 0.4–1.2)
GFR calc Af Amer: >60 mL/min (ref >60)
GFR calc non Af Amer: >60 mL/min (ref >60)
Glucose, Bld: 109 mg/dL — ABNORMAL HIGH (ref 70–99)
Potassium: 4 mEq/L (ref 3.5–5.1)
Sodium: 139 mEq/L (ref 135–145)

## 2010-06-12 LAB — GLUCOSE, CAPILLARY: Glucose-Capillary: 118 mg/dL — ABNORMAL HIGH (ref 70–99)

## 2010-07-07 LAB — GLUCOSE, CAPILLARY: Glucose-Capillary: 174 mg/dL — ABNORMAL HIGH (ref 70–99)

## 2010-08-04 NOTE — Cardiovascular Report (Signed)
NAME:  Leslie Davis, Leslie Davis           ACCOUNT NO.:  [id number]   MEDICAL RECORD NO.:  [id number]          PATIENT TYPE:  OUT   LOCATION:  CATH                         FACILITY:  [facility]   PHYSICIAN:  Thereasa Solo. Little, M.D. DATE OF BIRTH:  [birth year]   DATE OF PROCEDURE:  09/14/2007  DATE OF DISCHARGE:                            CARDIAC CATHETERIZATION   INDICATIONS:  Miedema is a 55 year old female has hypertension and  diabetes mellitus.  She was admitted with chest pain and has ruled out  for myocardial infarction and was brought for cardiac catheterization.  A distal aortogram was done at the level of the renal arteries because  of her blood pressure of 200/100 prior to starting the case.   PROCEDURE:  After obtaining informed consent, the patient was prepped  and draped in the usual sterile fashion exposing the right groin  following local anesthetic with 1% Xylocaine.  The Seldinger technique  employed a 5-French introducer sheath was placed in the right femoral  artery.  Left and right coronary arteriography, ventriculography in the  RAO projection, and a distal aortogram was performed.   A SmartNeedle was required to obtain vascular access.   COMPLICATIONS:  None.   EQUIPMENT:  5-French Judkins configuration catheters.   MEDICATIONS:  Because of her marked elevation of pressure, she was given  labetalol 20 mg IV at the beginning of the case and at the end of the  case, plus she was given 0.1 mg of oral clonidine.   RESULTS:  1. Hemodynamic monitoring of central aortic pressure was 205/82 and      that was after she had received the first dose of labetalol.  Her      left ventricular pressure was 206/11.  There was no aortic valve      gradient at the time of pullback.  2. Ventriculography.  Ventriculography in the RAO projection using 25      mL of contrasted 12 mL per second showed good opacification of left      ventricle, normal systolic function.  No mitral  regurgitation.      Ejection fraction greater than 60% and the end-diastolic pressure      was 19.  3. Distal aortogram.  Distal aortogram done at the level of renal      arteries showed that the renal arteries come off almost at the last      rib.  There was no evidence of renal artery stenosis and there was      no abdominal aortic aneurysm.  I tried with a right coronary      catheter and subselectively engaged this, but did not took my      catheter high enough to find the renal arteries.  4. Coronary arteriography.  5. Left main normal bifurcated.  6. LAD.  The LAD crossed the apex of the heart gave rise to small      diagonal branch.  This system was free of disease.  7. Circumflex.  The circumflex was free of disease.  It gave rise to      OM #1 and OM #2 that bifurcated.  This  entire circumflex system was      free of disease.  8. Right coronary artery.  The right coronary was a small vessel about      2.5 mm in diameter, it gave rise to a PDA and the system was free      of disease.   CONCLUSION:  1. No evidence of coronary disease.  2. Normal left ventricular systolic function.  3. No evidence of renal artery stenosis or abdominal aortic aneurysm.  4. Severe hypertension.   At this point, I can only assume that her elevation of her pressure may  be playing some role in her chest pain.  She clearly does not have  cardiac anatomy to explain the chest pain syndrome.           ______________________________  Thereasa Solo Little, M.D.     ABL/MEDQ  D:  09/14/2007  T:  09/15/2007  Job:  [id number]   cc:   [facility]  [name] N. Allyne Gee, M.D.  Ritta Slot, MD  Cath Lab

## 2010-08-04 NOTE — Discharge Summary (Signed)
NAME:  Leslie, Davis           ACCOUNT NO.:  [id number]   MEDICAL RECORD NO.:  [id number]          PATIENT TYPE:  INP   LOCATION:  1423                         [facility]AC[facility]   PHYSICIAN:  Mobolaji B. Bakare, M.D.DATE OF BIRTH:  1955/03/28   DATE OF ADMISSION:  09/12/2007  DATE OF DISCHARGE:  09/15/2007                               DISCHARGE SUMMARY   FINAL DIAGNOSES:  1. Noncardiac chest pain.  2. Diabetes mellitus, uncontrolled.  3. Hypertension.  4. History of lipidemia  5. Normocytic anemia.  6. Obesity.   PROCEDURE:  1. Chest x-ray done on September 12, 2007 showed no acute cardiopulmonary      disease.  2. Cardiac catheterization done by Dr. Julieanne Manson on September 14, 2007      showed no evidence of coronary disease, normal left ventricular      systolic function, no evidence of renal artery stenosis or      abdominal aortic aneurysm.  There was severe hypertension during      the procedure.  3. A 2D echocardiogram done on September 12, 2007 showed normal left      ventricular systolic function with an ejection fraction estimated      at 55-65%.  There was mild focal basilar septal hypertrophy.  Left      ventricular diastolic function parameters were normal.   CONSULTANT:  Cardiology consult provided by Dr. Donald Prose.   BRIEF HISTORY:  Please refer to the admission H&P for full details.  In  brief, [name] is a 55 year old African American female with a  history of diabetes mellitus, hypertension and hyperlipidemia.  She also  has a history of GERD.  She presented to the emergency room with  retrosternal chest pain which she reported as retrosternal chest pain  which radiated to left side but no radiation to the jaw.  It was  exacerbated by exertion and relieved with nitroglycerin.  She described  the pain quality as heartburn.  She has a history of reflux.  EKG was  normal.  Initial set of cardiac enzymes at the point of care showed a  troponin of 0.11.   The patient was admitted to the telemetry floor to  rule out myocardial infarction.  She was started on beta-blocker and  aspirin, and placed on sublingual nitroglycerin p.r.n.   HOSPITAL COURSE:  1. Noncardiac chest pain.  The patient had 4 more sets of cardiac      panels which were essentially normal and she did not have any more      chest pain during the course of hospitalization.  A follow-up EKG      showed a normal sinus rhythm without acute ST changes.  She was on      telemetry.  There was no malignant arrhythmia on telemetry.  A 2D      echocardiogram showed normal LV function and given the history of      exertional chest pain associated with shortness of breath and      mildly elevated troponin at that point of care with normal CK-MB      and myoglobin on  admission, it was felt prudent to further evaluate      coronaries.  She was seen in consultation by [facility]      Cardiovascular and the decision was made to pursue cardiac      catheterization.  Given body habitus, a stress test may not be      reliable.  The patient underwent cardiac catheterization on September 14, 2007.  Results as noted above.  Essentially normal coronaries.      During the course of this cardiac catheterization the patient had      severe hypertension with blood pressure of 206/low 100s.  She      received a total of 40 mg of labetalol IV and 1 tablet of 0.1      clonidine.  Blood pressure has improved subsequently.  2. Cardiac chest pain is probably related to gastroesophageal reflux      disease.  Patient has been on Nexium.  The pain has now gone away.      I have instructed her to follow up with Dr. Allyne Gee for chest pain      with cough associated with heartburn.  She may need to have an      upper endoscopy done.  She will be discharged on baby aspirin and      Nexium.  3. Diabetes mellitus.  The patient had an hemoglobin A1c of 9.4.      Apparently blood glucose has been uncontrolled  prior to      hospitalization.  On admission the patient was changed switched      from Actos/metformin 15/850 p.o. daily to glyburide 5 mg daily and      metformin 1000 mg b.i.d.  Unfortunately, she had hypoglycemic      episodes on this regimen.  Metformin was held in view of the      cardiac catheterization.  She would resume metformin 48 hours after      cardiac catheterization and glyburide has been discontinued.  She      was treated with sliding scale insulin.  At the time of discharge      CBG was 157.  This was pre breakfast.  She will be given Lantus 8      units prior to discharge.  The patient can resume previous dose of      metformin/Actos as she did before.  Further adjustments to her      regimen to be made by Dr. Allyne Gee on followup.  The patient has      been instructed to check her blood glucose pre breakfast and at      bedtime and show results to Dr. Allyne Gee at her next visit.  The      patient was also given dietary counseling by a nutritionist and she      will be referred to outpatient diabetic classes for further      education.  4. Hypertension.  Blood pressure was uncontrolled during the course of      hospitalization.  Medications have been adjusted.      Hydrochlorothiazide/Benicar has been increased to 25/40 daily.      Lopressor 25 mg b.i.d. is also added.  Blood pressure at the time      of discharge was 157/83.  I would anticipate further control with      this regimen.  The patient will follow up with Dr. Allyne Gee for any      further adjustment.  She has been counseled on a low-salt diet.  5. Hyperlipidemia.  The patient has a history of hyperlipidemia but      was not on a statin at the time of admission.  Upon evaluation by      cardiology, a statin was restarted.  6. Normocytic anemia.  Hemoglobin ranged between 10 and 11 during the      course of hospitalization.  The patient had a colonoscopy about 3      years ago which was normal per her report.   Anemia panel showed a      total iron binding capacity which was on the high side of normal at      352 with percentage saturation of 14.  However, folate, vitamin      B12, ferritin and iron were normal.  The patient has been started      on an iron supplement.  7. Obesity.  She was offered dietary counseling and weight loss      counseling.   DISCHARGE CONDITION:  Stable.  Vitals at the time of discharge:  Temperature 98.2, pulse of 63, blood pressure 157/83, 02 sat  98% on  room air, respiratory rate 18.   DISCHARGE LABORATORY DATA:  Sodium 137, potassium 3.7, chloride 104,  bicarb 26, BUN 5, creatinine 0.73, blood glucose 137, calcium 8.6.  Hemoglobin 9.9, hematocrit 29.5, platelets 286, white cells 36.3.   DISCHARGE MEDICATIONS:  Baby aspirin 81 mg daily,  Benicar/hydrochlorothiazide 40/25 one p.o. daily, Nu-Iron 150 mg daily,  Lopressor 75 mg p.o. b.i.d., Nexium 40 mg daily, Actos/metformin 15/850  one p.o. daily, metronidazole 500 mg b.i.d., ciprofloxacin 500 mg b.i.d.  The patient was on these antibiotics prior to hospitalization.   INSTRUCTIONS:  Follow up at outpatient diabetic classes to follow up  with blood glucose checks and blood pressure checks at home and show  results to Dr. Allyne Gee at the next visit.      Mobolaji B. Corky Downs, M.D.  Electronically Signed     MBB/MEDQ  D:  09/15/2007  T:  09/15/2007  Job:  [id number]   cc:   Candyce Churn. Allyne Gee, M.D.  Fax: [phone]

## 2010-08-04 NOTE — H&P (Signed)
NAME:  Leslie Davis, Leslie Davis NO.:  [id number]   MEDICAL RECORD NO.:  [id number]          PATIENT TYPE:  EMS   LOCATION:  ED                           FACILITY:  [facility]   PHYSICIAN:  Sabino Donovan, MD        DATE OF BIRTH:  [birth year]   DATE OF ADMISSION:  09/12/2007  DATE OF DISCHARGE:                              HISTORY & PHYSICAL   CHIEF COMPLAINT:  Chest pain.   PRIMARY CARE PHYSICIAN:  [name] N. Allyne Gee, M.D.   HISTORY OF PRESENT ILLNESS:  The patient is a 55 year old African  American female with a history of diabetes, hypertension, hyperlipidemia  and GERD, who presented with a complaint of chest pain.  She reports  that her pain started about 2 weeks ago.  Reports pain similar to  heartburn but it radiates to the left side.  No radiation to the jaw.  She also has been getting short of breath.  She tells me that it started  about 2 weeks ago.  At that time she went to the ER and she was ruled  out for acute MI and she continues to have this pain on and off the last  2 weeks and had pain yesterday when she went to her PCP and apparently  had an EKG done at that time.  When she went home she continued to feel  this chest pain/burning pain in her chest and presented to the ER for  further evaluation.  She reports that the pain occurs both with exertion  and with rest.  She tells me that she is only able to walk about 30-40  feet without any exertion.  Reports occasional pedal edema but denies  any PND.  She does have history of diabetes and heart disease in family  but denies any smoking.  Reports that her mom had a heart attack when  she was about 55 years old.  Reports some relief with nitroglycerin that  was placed in the ER and denies any diaphoresis, nausea, vomiting or  chest pain occurs.  No relief with Nexium.  No association with food.   PAST MEDICAL HISTORY:  1. Diabetes.  2. Hyperlipidemia.  3. Hypertension.  4. GERD.   FAMILY HISTORY:  Positive for  coronary disease, diabetes, hypertension  and stroke.   SOCIAL HISTORY:  Negative x3.   DRUG ALLERGIES:  None.   MEDICATIONS:  Actos, metformin, Nexium, Benicar.  Dosing of these  medications is not available as the patient does not know.   PHYSICAL EXAMINATION:  Temperature 96.9, pulse 63, respiratory rate 22,  blood pressure 186/77.  She is saturating 99% on room air.  GENERAL:  She is in no acute distress.  HEENT:  PERRLA, EOMI.  NECK:  No lymphadenopathy or thyromegaly, no JVD.  CHEST:  Clear to auscultation bilaterally.  CARDIOVASCULAR:  Regular rate and rhythm.  No murmurs, rubs or gallops.  ABDOMEN:  Soft, nontender, nondistended.  Normoactive bowel sounds.  EXTREMITIES:  No clubbing or cyanosis.  She had trace pedal edema.  NEUROLOGIC:  Grossly intact.   LABS:  Sodium 139, potassium 3.5, BUN 5,  creatinine 0.85.  White count  6.3, H&H 11.1 and 33.1, platelets 307.  Troponin I initially was 0.11  and repeat 30 minutes later was less than 0.01.  BNP 36.  Chest x-ray  showed no infiltrate or effusion.  EKG showed normal sinus rhythm,  normal axis, left axis enlargement, no ST or T-wave changes.   ASSESSMENT AND PLAN:  A 55 year old African American female with chest  pain in the setting of risk factors diabetes, hypertension.   1. Chest pain.  The patient has risk factors for coronary disease      including diabetes, hypertension, hyperlipidemia, obesity.  She      does not have any family history of premature coronary artery      disease but given her risk factors, she will benefit from stress      testing.  We will do the following:  We will rule out acute MI by      serial enzymes.  We will obtain stress echocardiogram.  Check labs      including TSH, fasting lipids, and modify risk factors.  We will      control the rate-pressure product with Benicar and metoprolol.  2. Diabetes.  Check hemoglobin A1c and continue metformin.  We will      switch her from Actos to  glyburide given the recent finding of      Actos and increased risk of MI.  3. Hyperlipidemia.  Check fasting lipids.  4. Hypertension.  The blood pressure is fairly elevated.  We will      continue Benicar and add metoprolol.  5. Prophylaxis.  PPI and Lovenox.      Sabino Donovan, MD  Electronically Signed     MJ/MEDQ  D:  09/12/2007  T:  09/12/2007  Job:  [id number]   cc:   Candyce Churn. Allyne Gee, M.D.  Fax: [phone]

## 2010-08-07 NOTE — Op Note (Signed)
NAME:  Leslie Davis, Leslie Davis                      ACCOUNT NO.:  [id number]   MEDICAL RECORD NO.:  [id number]                   PATIENT TYPE:  OBV   LOCATION:  0361                                 FACILITY:  [facility]   PHYSICIAN:  Currie Paris, M.D.           DATE OF BIRTH:  [birth year]   DATE OF PROCEDURE:  11/05/2002  DATE OF DISCHARGE:                                 OPERATIVE REPORT   CCS [phone].   PREOPERATIVE DIAGNOSIS:  Chronic calculous cholecystitis.   POSTOPERATIVE DIAGNOSIS:  Chronic calculous cholecystitis.   OPERATION:  Laparoscopic cholecystectomy with operative cholangiogram.   SURGEON:  Currie Paris, M.D.   ASSISTANT:  Sandria Bales. Ezzard Standing, M.D.   ANESTHESIA:  General endotracheal.   CLINICAL HISTORY:  This patient is a 55 year old diabetic with gallstones  and biliary symptoms.  After discussion with the patient, she elected to  proceed to cholecystectomy.   DESCRIPTION OF PROCEDURE:  The patient was seen in the holding area and had  no further questions.  She was taken to the operating room and after  satisfactory general endotracheal anesthesia had been obtained, the abdomen  was prepped and draped.  Marcaine plain 0.25% was injected in the umbilical  incision, the skin incision made, the fascia opened and the peritoneal  cavity entered under direct vision.  A pursestring was placed, the Hasson  introduced, and the abdomen insufflated to 15.   With the patient in reverse Trendelenburg and tilted to the left, three  additional trocars were placed under direct vision.  The gallbladder was a  little distended but not inflamed.  The peritoneum over the cystic duct was  opened and the cystic duct and cystic artery dissected out and a window made  by opening the peritoneum on both sides of the triangle of Calot.  A single  clip was placed on the cystic duct and a single one on the cystic artery.  The duct was opened and a Cook catheter introduced and  operative  cholangiography done, which showed normal filling of the common duct, normal  filling of the hepatic radicles, good filling of the duodenum, no filling  defects.   The cystic duct catheter was removed and three clips were placed on the stay  side of the cystic duct.  It was divided.  Two additional clips were placed  on the cystic artery and it was divided, leaving two behind.  The  gallbladder was removed from below to above with coagulation current of the  cautery.  It was placed in a bag and brought out the umbilical port.  We  reinsufflated and did a final irrigation check for hemostasis, and there was  no evidence of bleeding or bile leaks.   The lateral ports were removed under direct vision.  The umbilical port  pursestring was tied down.  The abdomen was desufflated through the  epigastric port.  The skin was closed with 4-0 Monocryl subcuticular plus  Dermabond.   The patient tolerated the procedure well.  There were no operative  complications.                                               Currie Paris, M.D.    CJS/MEDQ  D:  11/05/2002  T:  11/05/2002  Job:  [id number]   cc:   Gaspar Garbe, M.D.  [address]  [place]  [place] [address]  Fax: [phone]   Vania Rea. Jarold Motto, M.D. [facility]

## 2010-08-07 NOTE — Consult Note (Signed)
[facility]  Patient:    Leslie Davis, Leslie Davis                  MRN: [id number] Proc. Date: 08/03/00 Attending:  Zigmund Daniel, M.D. CC:         Fritzi Mandes, M.D.   Consultation Report  HISTORY OF PRESENT ILLNESS:  The patient is a 55 year old black female who has a painful left great toe.  She had had some drainage from that and put peroxide on it with improvement in the way it feels, but it still is bothering her somewhat.  The patient has no history of any neuropathy or particular previous foot problems.  She is active and works.  No history of vascular disease.  She does not smoke.  She has type 2 diabetes which is treated by Glucophage.  She is not requiring insulin at this point.  She also has hypertension and hyperlipidemia.  PHYSICAL EXAMINATION:  GENERAL:  The patient is not obese.  She is 55" tall, weighing 169 pounds. EXTREMITIES:  There is no foot or leg edema.  Pulses are good.  There are no ulcers.  There is no redness even of the left great toe.  There is tenderness at the medial tip of the toe about the end of the nail.  There is protective sensation present in all areas of both feet.  There is mild callous formation. There is no significant deformity.  There are slightly hypertrophic nails in a couple of locations.  Temperatures are symmetric.  IMPRESSION: 1. Ingrown toenail left great toe, partially resolved. 2. Diabetes mellitus, type 2.  PLAN:  The patient was given a digital block in the left great toe and then the painful area was probed.  An ingrown nail was found and excised.  The entire nail was not excised.  I gave her good instructions regarding proper method of trimming the nails and patient is offered the opportunity of coming to the foot center on a three monthly basis for trimming of the nails.  Local measures alone should suffice to heal the area at this time.  The patient also saw an instructional video  regarding good foot care for diabetics. DD:  08/03/00 TD:  08/03/00 Job: [id number]

## 2010-08-07 NOTE — Assessment & Plan Note (Signed)
[facility]                           GASTROENTEROLOGY OFFICE NOTE   NAME:WHITSETTEkaterina, Denise                   MRN:          [id number]  DATE:12/10/2005                            DOB:          1955-09-08    Mattisyn was much better on Aciphex 20 mg a day after endoscopy on October 04, 2005.  Gastric biopsy was negative for Helicobacter pylori but she did have  a peptic stricture of her esophagus and a hiatal hernia.  She continues to  complain of fullness postprandially.  She is status post cholecystectomy.   PHYSICAL EXAMINATION:  Her weight today is 197.4 which is up three pounds  from July.  Blood pressure is 144/62 and pulse was 76 and regular.  ABDOMEN:  Unremarkable.  I could not appreciate a succussion splash.   ASSESSMENT:  I think Ms. Muscatello most likely has mild gastroparesis  associated with associated with her diabetes secondary to acid reflux.   RECOMMENDATIONS:  1. Multiple samples of Aciphex given for use 20 mg 30 minutes before the      first meal of the day.  2. Outpatient gastric emptying scan.  3. GI follow-up in one month's time.  4. Other medications as per Dr. Wylene Simmer.                                   Vania Rea. Jarold Motto, MD, Clementeen Graham, [place]   DRP/MedQ  DD:  12/10/2005  DT:  12/13/2005  Job #:  [id number]   cc:   Gaspar Garbe, M.D.

## 2010-08-07 NOTE — Op Note (Signed)
Leslie Davis, Leslie Davis                     ACCOUNT NO.:  [id number]   MEDICAL RECORD NO.:  [id number]                   PATIENT TYPE:  AMB   LOCATION:  DAY                                  FACILITY:  [facility]   PHYSICIAN:  Pershing Cox, M.D.            DATE OF BIRTH:  [birth year]   DATE OF PROCEDURE:  11/06/2001  DATE OF DISCHARGE:                                 OPERATIVE REPORT   PREOPERATIVE DIAGNOSES:  Cystic vulvar lesion enlarging and dyspareunia.   POSTOPERATIVE DIAGNOSES:  Cystic vulvar lesion enlarging and dyspareunia.   PROCEDURE:  Examination under anesthesia, wide vulvar excision of the left  labia majora.   ANESTHESIA:  General endotracheal plus Marcaine local.   SURGEON:  Pershing Cox, M.D.   INDICATIONS FOR PROCEDURE:  The patient is 55 years old. She has been  followed in my office for routine gynecologic care. This was noted in March  of 2002. She at that time was having no problems with it, but when seen for  her annual examination in July of this year, she complained of enlargement  of the cyst, a second cyst beneath it and pain with intercourse. She was  counselled regarding her options and has elected to proceed with wide  excision of these vulvar lesions.   OPERATIVE FINDINGS:  Examination under anesthesia shows the uterus to be  anteflexed, normal in size, no adnexal masses palpated. There were two  vulvar lesions, the largest was more superior and was approximately 2 cm in  size. The more inferior lesion which was posterior was approximately 1 cm in  size.   DESCRIPTION OF PROCEDURE:  Leslie Davis was brought to the operating  room with an IV in place, she received a gram of Ancef in the holding area.  Supine on the operating room table, general endotracheal anesthesia was  administered without difficulty. She was then placed into Allen stirrups and  the vulva was prepped with a solution of Hibiclens. A red rubber catheter  was used  to empty the bladder. Exam under anesthesia was performed. The  patient was draped for a sterile vaginal procedure. The vulvar lesion was  outlined with a marking pen. A stay suture was placed in the upper vulva for  traction. Using the skin markings as an outline just peripheral to the  cystic lesions, the 10 blade was used to incise the skin. Once the depth  permitted it, Bovie cautery and coag was used to excise the base of the  lesion. A stitch was placed at the superior portion of the excision for  marking. Bovie cautery was then used to obtain hemostasis at the base of the  vulvar excision. 3-0 Vicryl was used in interrupted sutures to close the  deep portion of the wound. A 4-0 subcuticular stitch was used to close the  skin edges. Then 4-0 Vicryl was used in several interrupted stitches where  the defect was still gaping.  The patient tolerated the procedure well and  was taken to the recovery room in excellent condition.                                               Pershing Cox, M.D.    MAJ/MEDQ  D:  11/06/2001  T:  11/06/2001  Job:  [id number]

## 2010-11-14 ENCOUNTER — Inpatient Hospital Stay (INDEPENDENT_AMBULATORY_CARE_PROVIDER_SITE_OTHER)
Admission: RE | Admit: 2010-11-14 | Discharge: 2010-11-14 | Disposition: A | Discharge status: Home / Self Care | Payer: 59 | Source: Ambulatory Visit | Attending: Family Medicine | Admitting: Family Medicine

## 2010-11-14 DIAGNOSIS — J40 Bronchitis, not specified as acute or chronic: Secondary | ICD-10-CM

## 2010-12-17 LAB — POCT I-STAT, CHEM 8
BUN: 14
BUN: 11
Calcium, Ion: 1.17
Calcium, Ion: 1.23
Chloride: 105
Chloride: 102
Creatinine, Ser: 0.8
Creatinine, Ser: 0.9
Glucose, Bld: 103 — ABNORMAL HIGH
Glucose, Bld: 114 — ABNORMAL HIGH
HCT: 35 — ABNORMAL LOW
HCT: 37
Hemoglobin: 11.9 — ABNORMAL LOW
Hemoglobin: 12.6
Potassium: 5.2 — ABNORMAL HIGH
Potassium: 3.5
Sodium: 134 — ABNORMAL LOW
Sodium: 136
TCO2: 26
TCO2: 25

## 2010-12-17 LAB — CARDIAC PANEL(CRET KIN+CKTOT+MB+TROPI)
CK, MB: 0.9
CK, MB: 1.2
CK, MB: 1.4
Relative Index: 1.1
Relative Index: 1.2
Relative Index: INVALID
Total CK: 106
Total CK: 114
Total CK: 85
Troponin I: <0.01
Troponin I: <0.01
Troponin I: <0.01

## 2010-12-17 LAB — URINALYSIS, ROUTINE W REFLEX MICROSCOPIC
Bilirubin Urine: NEGATIVE
Bilirubin Urine: NEGATIVE
Glucose, UA: NEGATIVE
Glucose, UA: NEGATIVE
Hgb urine dipstick: NEGATIVE
Hgb urine dipstick: NEGATIVE
Ketones, ur: NEGATIVE
Ketones, ur: NEGATIVE
Nitrite: NEGATIVE
Nitrite: NEGATIVE
Protein, ur: NEGATIVE
Protein, ur: NEGATIVE
Specific Gravity, Urine: 1.008
Specific Gravity, Urine: 1.008
Urobilinogen, UA: 0.2
Urobilinogen, UA: 0.2
pH: 6
pH: 5.5

## 2010-12-17 LAB — BASIC METABOLIC PANEL
BUN: 12
BUN: 5 — ABNORMAL LOW
BUN: 5 — ABNORMAL LOW
BUN: 5 — ABNORMAL LOW
BUN: 5 — ABNORMAL LOW
BUN: 7
CO2: 26
CO2: 22
CO2: 23
CO2: 23
CO2: 24
CO2: 26
Calcium: 9.4
Calcium: 8.2 — ABNORMAL LOW
Calcium: 8.6
Calcium: 8.7
Calcium: 8.8
Calcium: 9.4
Chloride: 105
Chloride: 104
Chloride: 106
Chloride: 107
Chloride: 110
Chloride: 112
Creatinine, Ser: 0.74
Creatinine, Ser: 0.59
Creatinine, Ser: 0.69
Creatinine, Ser: 0.73
Creatinine, Ser: 0.75
Creatinine, Ser: 0.85
GFR calc Af Amer: >60
GFR calc Af Amer: >60
GFR calc Af Amer: >60
GFR calc Af Amer: >60
GFR calc Af Amer: >60
GFR calc Af Amer: >60
GFR calc non Af Amer: >60
GFR calc non Af Amer: >60
GFR calc non Af Amer: >60
GFR calc non Af Amer: >60
GFR calc non Af Amer: >60
GFR calc non Af Amer: >60
Glucose, Bld: 110 — ABNORMAL HIGH
Glucose, Bld: 106 — ABNORMAL HIGH
Glucose, Bld: 121 — ABNORMAL HIGH
Glucose, Bld: 137 — ABNORMAL HIGH
Glucose, Bld: 62 — ABNORMAL LOW
Glucose, Bld: 99
Potassium: 3.9
Potassium: 3.4 — ABNORMAL LOW
Potassium: 3.5
Potassium: 3.7
Potassium: 3.9
Potassium: 4
Sodium: 139
Sodium: 137
Sodium: 137
Sodium: 139
Sodium: 139
Sodium: 140

## 2010-12-17 LAB — CBC
HCT: 32.7 — ABNORMAL LOW
HCT: 29.5 — ABNORMAL LOW
HCT: 30.2 — ABNORMAL LOW
HCT: 30.2 — ABNORMAL LOW
HCT: 31 — ABNORMAL LOW
HCT: 33.1 — ABNORMAL LOW
HCT: 33.8 — ABNORMAL LOW
Hemoglobin: 10.9 — ABNORMAL LOW
Hemoglobin: 10 — ABNORMAL LOW
Hemoglobin: 10.3 — ABNORMAL LOW
Hemoglobin: 10.3 — ABNORMAL LOW
Hemoglobin: 11.1 — ABNORMAL LOW
Hemoglobin: 9.9 — ABNORMAL LOW
Hemoglobin: 11.2 — ABNORMAL LOW
MCHC: 33.3
MCHC: 33.1
MCHC: 33.1
MCHC: 33.5
MCHC: 33.6
MCHC: 34.1
MCHC: 33
MCV: 86.2
MCV: 86.7
MCV: 87.1
MCV: 87.1
MCV: 87.9
MCV: 88.1
MCV: 86.6
Platelets: 298
Platelets: 271
Platelets: 276
Platelets: 283
Platelets: 286
Platelets: 307
Platelets: 287
RBC: 3.79 — ABNORMAL LOW
RBC: 3.41 — ABNORMAL LOW
RBC: 3.42 — ABNORMAL LOW
RBC: 3.43 — ABNORMAL LOW
RBC: 3.56 — ABNORMAL LOW
RBC: 3.8 — ABNORMAL LOW
RBC: 3.91
RDW: 15.6 — ABNORMAL HIGH
RDW: 15.4
RDW: 16.6 — ABNORMAL HIGH
RDW: 16.9 — ABNORMAL HIGH
RDW: 17 — ABNORMAL HIGH
RDW: 17.5 — ABNORMAL HIGH
RDW: 16.1 — ABNORMAL HIGH
WBC: 7
WBC: 5.5
WBC: 5.7
WBC: 6.2
WBC: 6.3
WBC: 6.3
WBC: 6.7

## 2010-12-17 LAB — DIFFERENTIAL
Basophils Absolute: 0
Basophils Absolute: 0.1
Basophils Absolute: 0
Basophils Relative: 0
Basophils Relative: 2 — ABNORMAL HIGH
Basophils Relative: 0
Eosinophils Absolute: 0.1
Eosinophils Absolute: 0.2
Eosinophils Absolute: 0.1
Eosinophils Relative: 2
Eosinophils Relative: 3
Eosinophils Relative: 2
Lymphocytes Relative: 36
Lymphocytes Relative: 30
Lymphocytes Relative: 23
Lymphs Abs: 2.5
Lymphs Abs: 1.9
Lymphs Abs: 1.6
Monocytes Absolute: 0.6
Monocytes Absolute: 0.6
Monocytes Absolute: 0.4
Monocytes Relative: 8
Monocytes Relative: 10
Monocytes Relative: 6
Neutro Abs: 3.8
Neutro Abs: 3.5
Neutro Abs: 4.6
Neutrophils Relative %: 54
Neutrophils Relative %: 55
Neutrophils Relative %: 69

## 2010-12-17 LAB — POCT CARDIAC MARKERS
CKMB, poc: 1.6
CKMB, poc: 1.8
CKMB, poc: 1.5
Myoglobin, poc: 40.8
Myoglobin, poc: 52.9
Myoglobin, poc: 47.9
Operator id: 244461
Operator id: 290111
Operator id: 244461
Troponin i, poc: <0.05
Troponin i, poc: <0.05
Troponin i, poc: 0.11 — ABNORMAL HIGH

## 2010-12-17 LAB — CK TOTAL AND CKMB (NOT AT ARMC)
CK, MB: 2
Relative Index: 1.3
Total CK: 150

## 2010-12-17 LAB — IRON AND TIBC
Iron: 50
Saturation Ratios: 14 — ABNORMAL LOW
TIBC: 352
UIBC: 302

## 2010-12-17 LAB — HEMOGLOBIN A1C
Hgb A1c MFr Bld: 9.4 — ABNORMAL HIGH
Mean Plasma Glucose: 257

## 2010-12-17 LAB — LIPID PANEL
Cholesterol: 160
HDL: 57
LDL Cholesterol: 86
Total CHOL/HDL Ratio: 2.8
Triglycerides: 87
VLDL: 17

## 2010-12-17 LAB — PHOSPHORUS: Phosphorus: 3.5

## 2010-12-17 LAB — B-NATRIURETIC PEPTIDE (CONVERTED LAB): Pro B Natriuretic peptide (BNP): 35.9

## 2010-12-17 LAB — RETICULOCYTES
RBC.: 3.78 — ABNORMAL LOW
Retic Count, Absolute: 75.6
Retic Ct Pct: 2

## 2010-12-17 LAB — VITAMIN B12: Vitamin B-12: 301 (ref 211–911)

## 2010-12-17 LAB — FOLATE: Folate: >20

## 2010-12-17 LAB — TROPONIN I: Troponin I: <0.01

## 2010-12-17 LAB — PROTIME-INR
INR: 0.9
INR: 0.9
Prothrombin Time: 12.3
Prothrombin Time: 12.8

## 2010-12-17 LAB — TSH: TSH: 2.381

## 2010-12-17 LAB — MAGNESIUM: Magnesium: 1.8

## 2010-12-17 LAB — FERRITIN: Ferritin: 78 (ref 10–291)

## 2010-12-17 LAB — D-DIMER, QUANTITATIVE (NOT AT ARMC): D-Dimer, Quant: 0.33

## 2010-12-17 LAB — APTT: aPTT: 26

## 2010-12-25 LAB — GLUCOSE, CAPILLARY: Glucose-Capillary: 326 mg/dL — ABNORMAL HIGH (ref 70–99)

## 2011-02-05 ENCOUNTER — Ambulatory Visit (INDEPENDENT_AMBULATORY_CARE_PROVIDER_SITE_OTHER): Payer: 59 | Admitting: Gastroenterology

## 2011-02-05 ENCOUNTER — Encounter: Payer: Self-pay | Admitting: Gastroenterology

## 2011-02-05 ENCOUNTER — Other Ambulatory Visit (INDEPENDENT_AMBULATORY_CARE_PROVIDER_SITE_OTHER): Payer: 59

## 2011-02-05 DIAGNOSIS — R6889 Other general symptoms and signs: Secondary | ICD-10-CM

## 2011-02-05 DIAGNOSIS — E119 Type 2 diabetes mellitus without complications: Secondary | ICD-10-CM

## 2011-02-05 DIAGNOSIS — R110 Nausea: Secondary | ICD-10-CM

## 2011-02-05 DIAGNOSIS — R1013 Epigastric pain: Secondary | ICD-10-CM

## 2011-02-05 DIAGNOSIS — Z9089 Acquired absence of other organs: Secondary | ICD-10-CM

## 2011-02-05 DIAGNOSIS — Z9049 Acquired absence of other specified parts of digestive tract: Secondary | ICD-10-CM | POA: Insufficient documentation

## 2011-02-05 LAB — CBC WITH DIFFERENTIAL/PLATELET
Basophils Absolute: 0 10*3/uL (ref 0.0–0.1)
Basophils Relative: 0.8 % (ref 0.0–3.0)
Eosinophils Absolute: 0.1 10*3/uL (ref 0.0–0.7)
Eosinophils Relative: 1.4 % (ref 0.0–5.0)
HCT: 39.4 % (ref 36.0–46.0)
Hemoglobin: 13.2 g/dL (ref 12.0–15.0)
Lymphocytes Relative: 37.9 % (ref 12.0–46.0)
Lymphs Abs: 2.2 10*3/uL (ref 0.7–4.0)
MCHC: 33.6 g/dL (ref 30.0–36.0)
MCV: 87.5 fl (ref 78.0–100.0)
Monocytes Absolute: 0.5 10*3/uL (ref 0.1–1.0)
Monocytes Relative: 9.1 % (ref 3.0–12.0)
Neutro Abs: 3 10*3/uL (ref 1.4–7.7)
Neutrophils Relative %: 50.8 % (ref 43.0–77.0)
Platelets: 252 10*3/uL (ref 150.0–400.0)
RBC: 4.5 Mil/uL (ref 3.87–5.11)
RDW: 14.2 % (ref 11.5–14.6)
WBC: 5.8 10*3/uL (ref 4.5–10.5)

## 2011-02-05 LAB — BASIC METABOLIC PANEL
BUN: 16 mg/dL (ref 6–23)
CO2: 26 mEq/L (ref 19–32)
Calcium: 9.7 mg/dL (ref 8.4–10.5)
Chloride: 95 mEq/L — ABNORMAL LOW (ref 96–112)
Creatinine, Ser: 0.8 mg/dL (ref 0.4–1.2)
GFR: 91.04 mL/min (ref >60.00)
Glucose, Bld: 376 mg/dL — ABNORMAL HIGH (ref 70–99)
Potassium: 4.2 mEq/L (ref 3.5–5.1)
Sodium: 134 mEq/L — ABNORMAL LOW (ref 135–145)

## 2011-02-05 LAB — SEDIMENTATION RATE: Sed Rate: 32 mm/hr — ABNORMAL HIGH (ref 0–22)

## 2011-02-05 LAB — HEPATIC FUNCTION PANEL
ALT: 34 U/L (ref 0–35)
AST: 33 U/L (ref 0–37)
Albumin: 4.4 g/dL (ref 3.5–5.2)
Alkaline Phosphatase: 112 U/L (ref 39–117)
Bilirubin, Direct: 0.1 mg/dL (ref 0.0–0.3)
Total Bilirubin: 0.6 mg/dL (ref 0.3–1.2)
Total Protein: 8.3 g/dL (ref 6.0–8.3)

## 2011-02-05 LAB — IBC PANEL
Iron: 102 ug/dL (ref 42–145)
Saturation Ratios: 21 % (ref 20.0–50.0)
Transferrin: 346.9 mg/dL (ref 212.0–360.0)

## 2011-02-05 LAB — TSH: TSH: 1.08 u[IU]/mL (ref 0.35–5.50)

## 2011-02-05 LAB — VITAMIN B12: Vitamin B-12: 550 pg/mL (ref 211–911)

## 2011-02-05 LAB — AMYLASE: Amylase: 42 U/L (ref 27–131)

## 2011-02-05 LAB — FOLATE: Folate: 23.2 ng/mL (ref >5.9)

## 2011-02-05 LAB — LIPASE: Lipase: 32 U/L (ref 11.0–59.0)

## 2011-02-05 MED ORDER — TRAMADOL HCL 50 MG PO TABS: 50.0000 mg | ORAL_TABLET | Freq: Four times a day (QID) | ORAL | Status: DC | PRN

## 2011-02-05 MED ORDER — RABEPRAZOLE SODIUM 20 MG PO TBEC: 20.0000 mg | DELAYED_RELEASE_TABLET | Freq: Every day | ORAL | Status: DC

## 2011-02-05 MED ORDER — ONDANSETRON HCL 4 MG PO TABS: 4.0000 mg | ORAL_TABLET | Freq: Three times a day (TID) | ORAL | Status: DC | PRN

## 2011-02-05 NOTE — Progress Notes (Signed)
History of Present Illness:  This is a very nice 54 year old African American female who had previously seen and treated for acid reflux problems. She also is status post cholecystectomy for cholelithiasis 10 years ago. She currently describes a one-week history of rather constant epigastric abdominal pain with nausea but no emesis. She's had some reflux symptoms but denies dysphagia, or any specific hepatobiliary complaints such as icterus, clay colored stools, dark urine, fever chills. She does take a 1 mg of aspirin a day but otherwise denies NSAID him a cigarette, or alcohol use. Previously was on PPI therapy but is not at this time. Previously the patient had constipation but this also has resolved. History of adult onset diabetes managed with oral medications. No history of hepatitis, pancreatitis, or peptic ulcer disease.  I have reviewed this patient's present history, medical and surgical past history, allergies and medications.     ROS: The remainder of the 10 point ROS is negative.. denies cardiopulmonary problems she does have arthralgias, myalgias, and a generalized flulike syndrome.     Physical Exam: General well developed well nourished patient in no acute distress, appearing her stated age Eyes PERRLA, no icterus, fundoscopic exam per opthamologist Skin no lesions noted Neck supple, no adenopathy, no thyroid enlargement, no tenderness Chest clear to percussion and auscultation Heart no significant murmurs, gallops or rubs noted Abdomen no hepatosplenomegaly masses or tenderness, BS normal. There is mild epigastric tenderness without rebound.e. Extremities no acute joint lesions, edema, phlebitis or evidence of cellulitis. Neurologic patient oriented x 3, cranial nerves intact, no focal neurologic deficits noted. Psychological mental status normal and normal affect.  Assessment and plan: Acute epigastric pain with flulike syndrome, rule out hepatitis, pancreatitis, or peptic  ulcer disease. Labs have been ordered, also endoscopy ASAP. I placed her on AcipHex 20 mg twice a day with when necessary Zofran 4 mg every 8 hours as needed, and when necessary tramadol 50 mg every 6-8 hours.  Encounter Diagnoses  Name Primary?  . Epigastric pain   . Nausea

## 2011-02-05 NOTE — Patient Instructions (Signed)
Please go to the basement upon leaving today to have your labs done. Your prescription(s) has(have) been sent to your pharmacy for you to pick up. You have been scheduled for an Endoscopy with separate instructions given.

## 2011-02-08 ENCOUNTER — Telehealth: Payer: Self-pay | Admitting: Gastroenterology

## 2011-02-08 ENCOUNTER — Encounter: Payer: Self-pay | Admitting: Gastroenterology

## 2011-02-08 ENCOUNTER — Ambulatory Visit (AMBULATORY_SURGERY_CENTER): Payer: 59 | Admitting: Gastroenterology

## 2011-02-08 DIAGNOSIS — R1013 Epigastric pain: Secondary | ICD-10-CM

## 2011-02-08 DIAGNOSIS — E119 Type 2 diabetes mellitus without complications: Secondary | ICD-10-CM

## 2011-02-08 DIAGNOSIS — R110 Nausea: Secondary | ICD-10-CM

## 2011-02-08 LAB — FERRITIN: Ferritin: 177.9 ng/mL (ref 10.0–291.0)

## 2011-02-08 LAB — GLUCOSE, CAPILLARY
Glucose-Capillary: 374 mg/dL — ABNORMAL HIGH (ref 70–99)
Glucose-Capillary: 387 mg/dL — ABNORMAL HIGH (ref 70–99)
Glucose-Capillary: 392 mg/dL — ABNORMAL HIGH (ref 70–99)

## 2011-02-08 MED ORDER — SODIUM CHLORIDE 0.9 % IV SOLN: 500.0000 mL | INTRAVENOUS | Status: DC

## 2011-02-08 NOTE — Telephone Encounter (Signed)
I CARE F/U

## 2011-02-08 NOTE — Progress Notes (Signed)
Patient did not experience any of the following events: a burn prior to discharge; a fall within the facility; wrong site/side/patient/procedure/implant event; or a hospital transfer or hospital admission upon discharge from the facility. (G8907) Patient did not have preoperative order for IV antibiotic SSI prophylaxis. (G8918)  

## 2011-02-08 NOTE — Patient Instructions (Signed)
Discharge instructions given with verbal understanding. Handouts on hiatal hernia and a soft diet given. Resume previous medications. 

## 2011-02-09 ENCOUNTER — Telehealth: Payer: Self-pay

## 2011-02-09 ENCOUNTER — Telehealth: Payer: Self-pay | Admitting: *Deleted

## 2011-02-09 NOTE — Telephone Encounter (Signed)
No answer at given number

## 2011-02-09 NOTE — Telephone Encounter (Signed)
I have scheduled a follow up appointment with Leslie Davis at [facility] Dr Allyne Gee office  for 02/15/11 9:30.  I have left a message for the patient to call back to discuss.

## 2011-02-10 NOTE — Telephone Encounter (Signed)
lmom for pt to call back

## 2011-02-10 NOTE — Telephone Encounter (Signed)
Notified pt of her appt on 02/15/11 at 09:30am with Macario Carls; pt states understanding.

## 2011-04-15 ENCOUNTER — Other Ambulatory Visit (HOSPITAL_COMMUNITY): Payer: Self-pay | Admitting: Internal Medicine

## 2011-04-15 DIAGNOSIS — Z1231 Encounter for screening mammogram for malignant neoplasm of breast: Secondary | ICD-10-CM

## 2011-04-20 ENCOUNTER — Ambulatory Visit (HOSPITAL_COMMUNITY)
Admission: RE | Admit: 2011-04-20 | Discharge: 2011-04-20 | Disposition: A | Discharge status: Home / Self Care | Payer: 59 | Source: Ambulatory Visit | Attending: Internal Medicine | Admitting: Internal Medicine

## 2011-04-20 DIAGNOSIS — Z1231 Encounter for screening mammogram for malignant neoplasm of breast: Secondary | ICD-10-CM | POA: Insufficient documentation

## 2011-07-10 ENCOUNTER — Ambulatory Visit (INDEPENDENT_AMBULATORY_CARE_PROVIDER_SITE_OTHER): Payer: 59 | Admitting: Family Medicine

## 2011-07-10 VITALS — BP 145/74 | HR 60 | Temp 98.0°F | Resp 16 | Ht 64.5 in | Wt 201.2 lb

## 2011-07-10 DIAGNOSIS — R5381 Other malaise: Secondary | ICD-10-CM

## 2011-07-10 DIAGNOSIS — J029 Acute pharyngitis, unspecified: Secondary | ICD-10-CM

## 2011-07-10 DIAGNOSIS — R059 Cough, unspecified: Secondary | ICD-10-CM

## 2011-07-10 DIAGNOSIS — R05 Cough: Secondary | ICD-10-CM

## 2011-07-10 LAB — POCT CBC
Granulocyte percent: 46.1 %G (ref 37–80)
HCT, POC: 39.5 % (ref 37.7–47.9)
Hemoglobin: 12.4 g/dL (ref 12.2–16.2)
Lymph, poc: 2.8 (ref 0.6–3.4)
MCH, POC: 27.5 pg (ref 27–31.2)
MCHC: 31.4 g/dL — AB (ref 31.8–35.4)
MCV: 87.5 fL (ref 80–97)
MID (cbc): 0.6 (ref 0–0.9)
MPV: 8.4 fL (ref 0–99.8)
POC Granulocyte: 2.9 (ref 2–6.9)
POC LYMPH PERCENT: 44.4 %L (ref 10–50)
POC MID %: 9.5 %M (ref 0–12)
Platelet Count, POC: 311 10*3/uL (ref 142–424)
RBC: 4.51 M/uL (ref 4.04–5.48)
RDW, POC: 15.7 %
WBC: 6.3 10*3/uL (ref 4.6–10.2)

## 2011-07-10 LAB — POCT RAPID STREP A (OFFICE): Rapid Strep A Screen: NEGATIVE

## 2011-07-10 MED ORDER — ACETAMINOPHEN-CODEINE #3 300-30 MG PO TABS: 1.0000 | ORAL_TABLET | Freq: Four times a day (QID) | ORAL | Status: AC | PRN

## 2011-07-10 MED ORDER — BENZONATATE 100 MG PO CAPS: 100.0000 mg | ORAL_CAPSULE | Freq: Three times a day (TID) | ORAL | Status: AC | PRN

## 2011-07-10 NOTE — Progress Notes (Signed)
Patient Name: Leslie Davis Date of Birth: 11-28-1955 Medical Record Number: [id number] Gender: female Date of Encounter: 07/10/2011  History of Present Illness:  Leslie Davis is a 56 y.o. very pleasant female patient who presents with the following:  Here with a "cold" and "cough cough cough."  Ill for about 3- 4 days.She felt hot one day and thought that she had a fever but she did not check her temperature.  Also has a ST.  No GI symptoms, does have a runny nose.  No sneezing.  Coughing up "just a little yellow stuff." She has not tried any medications OTC due to her diabetes- she was afraid to try any medications.    PCP is Velna Hatchet- she handles her DM.  Her most recent A1c was above 9.   Patient Active Problem List  Diagnoses  . DIABETES MELLITUS II, UNCOMPLICATED  . HYPERLIPIDEMIA  . HYPERTENSION, BENIGN SYSTEMIC  . GASTROESOPHAGEAL REFLUX, NO ESOPHAGITIS  . CONSTIPATION  . FATTY LIVER DISEASE  . RENAL CALCULUS  . AMENORRHEA  . NAUSEA  . EPIGASTRIC PAIN  . Epigastric pain  . Nausea  . DM (diabetes mellitus)  . Hx of cholecystectomy  . Flu-like symptoms   Past Medical History  Diagnosis Date  . Fatty liver disease, nonalcoholic   . Gastroparesis   . Renal calculus   . Gastritis   . HTN (hypertension)   . HLD (hyperlipidemia)   . GERD (gastroesophageal reflux disease)   . DM (diabetes mellitus)    Past Surgical History  Procedure Date  . Cholecystectomy    History  Substance Use Topics  . Smoking status: Former Games developer  . Smokeless tobacco: Never Used  . Alcohol Use: No   Family History  Problem Relation Age of Onset  . Diabetes Mother   . Diabetes      grandmother  . Stomach cancer Maternal Aunt     X 2 aunts  . Colon cancer Neg Hx    No Known Allergies  Medication list has been reviewed and updated.  Review of Systems: As per HPI- otherwise negative.   Physical Examination: Filed Vitals:   07/10/11 0950  BP: 145/74  Pulse:  60  Temp: 98 F (36.7 C)  TempSrc: Oral  Resp: 16  Height: 5' 4.5" (1.638 m)  Weight: 201 lb 3.2 oz (91.264 kg)    Body mass index is 34.00 kg/(m^2).  GEN: WDWN, NAD, Non-toxic, A & O x 3, obese, hoarse voice HEENT: Atraumatic, Normocephalic. Neck supple. No masses, No LAD.  TM, oropharynx wnl Ears and Nose: No external deformity. CV: RRR, No M/G/R. No JVD. No thrill. No extra heart sounds. PULM: CTA B, no wheezes, crackles, rhonchi. No retractions. No resp. distress. No accessory muscle use. ABD: S, NT, ND, +BS. No rebound. No HSM. EXTR: No c/c/e NEURO Normal gait.  PSYCH: Normally interactive. Conversant. Not depressed or anxious appearing.  Calm demeanor.   Results for orders placed in visit on 07/10/11  POCT CBC      Component Value Range   WBC 6.3  4.6 - 10.2 (K/uL)   Lymph, poc 2.8  0.6 - 3.4    POC LYMPH PERCENT 44.4  10 - 50 (%L)   MID (cbc) 0.6  0 - 0.9    POC MID % 9.5  0 - 12 (%M)   POC Granulocyte 2.9  2 - 6.9    Granulocyte percent 46.1  37 - 80 (%G)   RBC 4.51  4.04 -  5.48 (M/uL)   Hemoglobin 12.4  12.2 - 16.2 (g/dL)   HCT, POC 21.3  08.6 - 47.9 (%)   MCV 87.5  80 - 97 (fL)   MCH, POC 27.5  27 - 31.2 (pg)   MCHC 31.4 (*) 31.8 - 35.4 (g/dL)   RDW, POC 57.8     Platelet Count, POC 311  142 - 424 (K/uL)   MPV 8.4  0 - 99.8 (fL)  POCT RAPID STREP A (OFFICE)      Component Value Range   Rapid Strep A Screen Negative  Negative     Assessment and Plan: 1. Cough  POCT CBC, benzonatate (TESSALON) 100 MG capsule, acetaminophen-codeine (TYLENOL #3) 300-30 MG per tablet  2. Malaise  POCT CBC, acetaminophen-codeine (TYLENOL #3) 300-30 MG per tablet  3. Sore throat  POCT rapid strep A  suspect a viral URI.  Prescribed medications as above which she may safely use with DM. Tylenol #3 for cough- avoid syrups due to sugar content. Patient (or parent if minor) instructed to return to clinic or call if not better in 3 day(s).  Sooner if worse.  Also may use mucinex plain  as needed

## 2011-07-20 ENCOUNTER — Ambulatory Visit: Payer: 59 | Admitting: Gastroenterology

## 2011-08-18 ENCOUNTER — Encounter: Payer: Self-pay | Admitting: *Deleted

## 2011-08-19 ENCOUNTER — Ambulatory Visit (INDEPENDENT_AMBULATORY_CARE_PROVIDER_SITE_OTHER): Payer: 59 | Admitting: Gastroenterology

## 2011-08-19 ENCOUNTER — Encounter: Payer: Self-pay | Admitting: Gastroenterology

## 2011-08-19 DIAGNOSIS — E66812 Obesity, class 2: Secondary | ICD-10-CM

## 2011-08-19 DIAGNOSIS — Z8719 Personal history of other diseases of the digestive system: Secondary | ICD-10-CM

## 2011-08-19 DIAGNOSIS — E119 Type 2 diabetes mellitus without complications: Secondary | ICD-10-CM

## 2011-08-19 DIAGNOSIS — K5900 Constipation, unspecified: Secondary | ICD-10-CM

## 2011-08-19 DIAGNOSIS — E669 Obesity, unspecified: Secondary | ICD-10-CM

## 2011-08-19 MED ORDER — MOVIPREP 100 G PO SOLR: 1.0000 | Freq: Once | ORAL | Status: DC

## 2011-08-19 MED ORDER — LINACLOTIDE 145 MCG PO CAPS: 1.0000 | ORAL_CAPSULE | Freq: Every day | ORAL | Status: DC

## 2011-08-19 NOTE — Progress Notes (Signed)
History of Present Illness:  This is a very nice 56 year old African American female with insulin-dependent diabetes. She complains of 3-4 months of worsening constipation despite previous treatment with MiraLax. She currently been to the bathroom once a week, and has to strain excessively, but has noticed no rectal bleeding or melena. She denies significant abdominal pain, but does have gas, bloating, and nausea. She has acid reflux which is managed with when necessary Zantac. There no current hepatobiliary complaints, dysphagia, anorexia or weight loss. The patient relates her diabetes is under good control, and she denies difficulties with renal failure, peripheral neuropathy, visual difficulties. Her gallbladder is removed several years ago. The patient denies any specific food intolerances, and allegedly uses large amount of fiber and by mouth fluids. Last colonoscopy was 7 years ago. Family history is noncontributory.  I have reviewed this patient's present history, medical and surgical past history, allergies and medications.     ROS: The remainder of the 10 point ROS is negative... no history of known hepatitis or pancreatitis. Previous ultrasound was reviewed and shows fatty infiltration. Liver function test and other labs have been unremarkable. The patient denies current cardiovascular, pulmonary, genitourinary or neurological problems. He does take aspirin 81 mg a day.     Physical Exam: Blood pressure 156/70, pulse 60 and regular, weight 205 pounds with a BMI of 35.19. General well developed well nourished patient in no acute distress, appearing her stated age Skin no lesions noted Neck supple, no adenopathy, no thyroid enlargement, no tenderness Chest clear to percussion and auscultation Heart no significant murmurs, gallops or rubs noted Abdomen no hepatosplenomegaly masses or tenderness, BS normal.  Rectal inspection normal no fissures, or fistulae noted.  No masses or tenderness on  digital exam. Stool guaiac negative. Extremities no acute joint lesions, edema, phlebitis or evidence of cellulitis. Neurologic patient oriented x 3, cranial nerves intact, no focal neurologic deficits noted. Psychological mental status normal and normal affect.  Assessment and plan: Slow transit constipation probably related to her diabetes. I've scheduled followup colonoscopy exam, and will try [name] 145 mg a day for her constipation, I've also advised nightly 8 ounces of MiraLax with adjustment as per her bowel pattern. Her acid reflux seems to be well controlled with when necessary H2 blocker use. We will make appropriate adjustments in her diabetic medications for her colonoscopy exam. The patient has no history of chronic thyroid dysfunction.  Encounter Diagnosis  Name Primary?  . Constipation Yes

## 2011-08-19 NOTE — Patient Instructions (Addendum)
Your procedure has been scheduled for 09/10/2011, please follow the seperate instructions.  Patient instructed to hold insulin the night before the procedure.  Take [name] once a day before breakfast, samples given if it works well call back for a rx.  Take Miralax once a day, if [name] works well you can stop Miralax.  Your prescription(s) have been sent to you pharmacy, [facility].

## 2011-08-30 ENCOUNTER — Ambulatory Visit (INDEPENDENT_AMBULATORY_CARE_PROVIDER_SITE_OTHER): Payer: 59 | Admitting: Emergency Medicine

## 2011-08-30 VITALS — BP 173/81 | HR 55 | Temp 97.4°F | Resp 18 | Ht 65.0 in | Wt 204.0 lb

## 2011-08-30 DIAGNOSIS — R1011 Right upper quadrant pain: Secondary | ICD-10-CM

## 2011-08-30 LAB — POCT CBC
Granulocyte percent: 53.6 %G (ref 37–80)
HCT, POC: 42.4 % (ref 37.7–47.9)
Hemoglobin: 13.2 g/dL (ref 12.2–16.2)
Lymph, poc: 2.7 (ref 0.6–3.4)
MCH, POC: 27.6 pg (ref 27–31.2)
MCHC: 31.1 g/dL — AB (ref 31.8–35.4)
MCV: 88.6 fL (ref 80–97)
MID (cbc): 0.5 (ref 0–0.9)
MPV: 9.3 fL (ref 0–99.8)
POC Granulocyte: 3.7 (ref 2–6.9)
POC LYMPH PERCENT: 39.7 %L (ref 10–50)
POC MID %: 6.7 %M (ref 0–12)
Platelet Count, POC: 331 10*3/uL (ref 142–424)
RBC: 4.78 M/uL (ref 4.04–5.48)
RDW, POC: 15.3 %
WBC: 6.9 10*3/uL (ref 4.6–10.2)

## 2011-08-30 NOTE — Progress Notes (Signed)
  Subjective:    Patient ID: Leslie Davis, female    DOB: February 02, 1956, 56 y.o.   MRN: [id number]  Abdominal Pain This is a recurrent problem. The current episode started more than 1 month ago. The onset quality is gradual. The problem occurs 2 to 4 times per day. The problem has been gradually worsening. The pain is located in the RUQ and epigastric region. The pain is at a severity of 3/10. The pain is moderate. The quality of the pain is colicky. Associated symptoms include belching, constipation and nausea. Pertinent negatives include no diarrhea, dysuria, fever, flatus, frequency, headaches, hematochezia, hematuria, melena, myalgias, vomiting or weight loss. The pain is aggravated by eating. The pain is relieved by nothing. Prior diagnostic workup includes lower endoscopy. There is no history of abdominal surgery, colon cancer, Crohn's disease, gallstones, GERD, irritable bowel syndrome, pancreatitis, PUD or ulcerative colitis.      Review of Systems  Constitutional: Negative.  Negative for fever and weight loss.  HENT: Negative.   Eyes: Negative.   Respiratory: Negative.   Cardiovascular: Negative.   Gastrointestinal: Positive for nausea, abdominal pain and constipation. Negative for vomiting, diarrhea, melena, hematochezia and flatus.  Genitourinary: Negative.  Negative for dysuria, frequency and hematuria.  Musculoskeletal: Negative.  Negative for myalgias.  Neurological: Negative.  Negative for headaches.       Objective:   Physical Exam  Constitutional: She is oriented to person, place, and time. She appears well-developed and well-nourished.  HENT:  Head: Normocephalic and atraumatic.  Right Ear: External ear normal.  Left Ear: External ear normal.  Eyes: Conjunctivae are normal. Pupils are equal, round, and reactive to light. No scleral icterus.  Neck: Normal range of motion. Neck supple.  Cardiovascular: Normal rate and regular rhythm.   Pulmonary/Chest: Effort normal.    Abdominal: Soft. There is tenderness (RUQ).  Musculoskeletal: Normal range of motion.  Neurological: She is alert and oriented to person, place, and time.  Skin: Skin is warm and dry.          Assessment & Plan:  Check on labs, sono abdomen and if negative a NM study with CCK  Results for orders placed in visit on 08/30/11  POCT CBC      Component Value Range   WBC 6.9  4.6 - 10.2 (K/uL)   Lymph, poc 2.7  0.6 - 3.4    POC LYMPH PERCENT 39.7  10 - 50 (%L)   MID (cbc) 0.5  0 - 0.9    POC MID % 6.7  0 - 12 (%M)   POC Granulocyte 3.7  2 - 6.9    Granulocyte percent 53.6  37 - 80 (%G)   RBC 4.78  4.04 - 5.48 (M/uL)   Hemoglobin 13.2  12.2 - 16.2 (g/dL)   HCT, POC 82.9  56.2 - 47.9 (%)   MCV 88.6  80 - 97 (fL)   MCH, POC 27.6  27 - 31.2 (pg)   MCHC 31.1 (*) 31.8 - 35.4 (g/dL)   RDW, POC 13.0     Platelet Count, POC 331  142 - 424 (K/uL)   MPV 9.3  0 - 99.8 (fL)    Continue taking Miralax

## 2011-08-30 NOTE — Progress Notes (Signed)
Addended by: Carmelina Dane on: 08/30/2011 01:47 PM   Modules accepted: Orders

## 2011-08-30 NOTE — Progress Notes (Signed)
  Subjective:    Patient ID: Leslie Davis, female    DOB: [birth year], 56 y.o.   MRN: [id number]  HPI    Review of Systems     Objective:   Physical Exam        Assessment & Plan:  Patient reminded me that she had her gallbladder removed so will await labs

## 2011-08-31 LAB — BASIC METABOLIC PANEL
BUN: 13 mg/dL (ref 6–23)
CO2: 26 mEq/L (ref 19–32)
Calcium: 10.2 mg/dL (ref 8.4–10.5)
Chloride: 99 mEq/L (ref 96–112)
Creat: 0.74 mg/dL (ref 0.50–1.10)
Glucose, Bld: 188 mg/dL — ABNORMAL HIGH (ref 70–99)
Potassium: 3.9 mEq/L (ref 3.5–5.3)
Sodium: 138 mEq/L (ref 135–145)

## 2011-08-31 LAB — H. PYLORI ANTIBODY, IGG: H Pylori IgG: 0.44 {ISR}

## 2011-09-10 ENCOUNTER — Ambulatory Visit (AMBULATORY_SURGERY_CENTER): Payer: 59 | Admitting: Gastroenterology

## 2011-09-10 ENCOUNTER — Other Ambulatory Visit: Payer: Self-pay | Admitting: Gastroenterology

## 2011-09-10 ENCOUNTER — Encounter: Payer: Self-pay | Admitting: Gastroenterology

## 2011-09-10 VITALS — BP 150/83 | HR 67 | Temp 97.9°F | Resp 18 | Ht 64.0 in | Wt 205.0 lb

## 2011-09-10 DIAGNOSIS — K449 Diaphragmatic hernia without obstruction or gangrene: Secondary | ICD-10-CM | POA: Insufficient documentation

## 2011-09-10 DIAGNOSIS — R1013 Epigastric pain: Secondary | ICD-10-CM

## 2011-09-10 DIAGNOSIS — K5900 Constipation, unspecified: Secondary | ICD-10-CM

## 2011-09-10 DIAGNOSIS — Z1211 Encounter for screening for malignant neoplasm of colon: Secondary | ICD-10-CM

## 2011-09-10 HISTORY — PX: COLONOSCOPY: SHX174

## 2011-09-10 LAB — GLUCOSE, CAPILLARY
Glucose-Capillary: 187 mg/dL — ABNORMAL HIGH (ref 70–99)
Glucose-Capillary: 167 mg/dL — ABNORMAL HIGH (ref 70–99)

## 2011-09-10 MED ORDER — LINACLOTIDE 145 MCG PO CAPS: 1.0000 | ORAL_CAPSULE | Freq: Every day | ORAL | Status: DC

## 2011-09-10 MED ORDER — SODIUM CHLORIDE 0.9 % IV SOLN: 500.0000 mL | INTRAVENOUS | Status: DC

## 2011-09-10 NOTE — Progress Notes (Signed)
NO COMPLAINTS NOTED IN THE RECOVERY ROOM. MAW  Patient did not experience any of the following events: a burn prior to discharge; a fall within the facility; wrong site/side/patient/procedure/implant event; or a hospital transfer or hospital admission upon discharge from the facility. (G8907) Patient did not have preoperative order for IV antibiotic SSI prophylaxis. (G8918)    

## 2011-09-10 NOTE — Op Note (Signed)
[facility] [address] [place], [place]  [address]  COLONOSCOPY PROCEDURE REPORT  PATIENT:  Leslie Davis, Leslie Davis  MR#:  #[id number] BIRTHDATE:  [birth year], 55 yrs. old  GENDER:  female ENDOSCOPIST:  Vania Rea. Jarold Motto, MD, [facility] REF. BY: PROCEDURE DATE:  09/10/2011 PROCEDURE:  Average-risk screening colonoscopy G0121 ASA CLASS:  Class II INDICATIONS:  Colorectal cancer screening, average risk MEDICATIONS:   propofol (Diprivan) 200 mg IV  DESCRIPTION OF PROCEDURE:   After the risks and benefits and of the procedure were explained, informed consent was obtained. Digital rectal exam was performed and revealed no abnormalities. The LB [id number] endoscope was introduced through the anus and advanced to the cecum, which was identified by both the appendix and ileocecal valve.  The quality of the prep was excellent, using [facility].  The instrument was then slowly withdrawn as the colon was fully examined. <<PROCEDUREIMAGES>>  FINDINGS:  No polyps or cancers were seen.  This was otherwise a normal examination of the colon.   Retroflexed views in the rectum revealed no abnormalities.    The scope was then withdrawn from the patient and the procedure completed.  COMPLICATIONS:  None ENDOSCOPIC IMPRESSION: 1) No polyps or cancers 2) Otherwise normal examination CHRONIC FUNCTIONAL CONSTIPATION. RECOMMENDATIONS: 1) Continue current colorectal screening recommendations for "routine risk" patients with a repeat colonoscopy in 10 years. CONTINUE [name] RX.  REPEAT EXAM:  No  ______________________________ Vania Rea. Jarold Motto, MD, Clementeen Graham  CC:  Dorothyann Peng, MD  n. Rosalie Doctor:   Vania Rea. Leondro Coryell at 09/10/2011 02:37 PM  Micki Riley, #[id number]

## 2011-09-10 NOTE — Patient Instructions (Addendum)
Handout was given to your care partner on constipation.  You may resume your prior medications today.  Please call if any questions or concerns.    YOU HAD AN ENDOSCOPIC PROCEDURE TODAY AT THE [facility]: Refer to the procedure report that was given to you for any specific questions about what was found during the examination.  If the procedure report does not answer your questions, please call your gastroenterologist to clarify.  If you requested that your care partner not be given the details of your procedure findings, then the procedure report has been included in a sealed envelope for you to review at your convenience later.  YOU SHOULD EXPECT: Some feelings of bloating in the abdomen. Passage of more gas than usual.  Walking can help get rid of the air that was put into your GI tract during the procedure and reduce the bloating. If you had a lower endoscopy (such as a colonoscopy or flexible sigmoidoscopy) you may notice spotting of blood in your stool or on the toilet paper. If you underwent a bowel prep for your procedure, then you may not have a normal bowel movement for a few days.  DIET: Your first meal following the procedure should be a light meal and then it is ok to progress to your normal diet.  A half-sandwich or bowl of soup is an example of a good first meal.  Heavy or fried foods are harder to digest and may make you feel nauseous or bloated.  Likewise meals heavy in dairy and vegetables can cause extra gas to form and this can also increase the bloating.  Drink plenty of fluids but you should avoid alcoholic beverages for 24 hours.  ACTIVITY: Your care partner should take you home directly after the procedure.  You should plan to take it easy, moving slowly for the rest of the day.  You can resume normal activity the day after the procedure however you should NOT DRIVE or use heavy machinery for 24 hours (because of the sedation medicines used during the test).     SYMPTOMS TO REPORT IMMEDIATELY: A gastroenterologist can be reached at any hour.  During normal business hours, 8:30 AM to 5:00 PM Monday through Friday, call [phone].  After hours and on weekends, please call the GI answering service at [phone] who will take a message and have the physician on call contact you.   Following lower endoscopy (colonoscopy or flexible sigmoidoscopy):  Excessive amounts of blood in the stool  Significant tenderness or worsening of abdominal pains  Swelling of the abdomen that is new, acute  Fever of 100F or higher    FOLLOW UP: If any biopsies were taken you will be contacted by phone or by letter within the next 1-3 weeks.  Call your gastroenterologist if you have not heard about the biopsies in 3 weeks.  Our staff will call the home number listed on your records the next business day following your procedure to check on you and address any questions or concerns that you may have at that time regarding the information given to you following your procedure. This is a courtesy call and so if there is no answer at the home number and we have not heard from you through the emergency physician on call, we will assume that you have returned to your regular daily activities without incident.  SIGNATURES/CONFIDENTIALITY: You and/or your care partner have signed paperwork which will be entered into your electronic medical record.  These signatures attest to the fact that that the information above on your After Visit Summary has been reviewed and is understood.  Full responsibility of the confidentiality of this discharge information lies with you and/or your care-partner.  

## 2011-09-13 ENCOUNTER — Telehealth: Payer: Self-pay | Admitting: *Deleted

## 2011-09-13 ENCOUNTER — Encounter: Payer: Self-pay | Admitting: Gastroenterology

## 2011-09-13 NOTE — Telephone Encounter (Signed)
Message left for patient at number provided by patient in admitting.

## 2011-09-13 NOTE — Progress Notes (Signed)
Case ID: [id number] Member Number: [id number]  Case Type: Initial Review Case Start Date: 09/13/2011  Case Status: Coverage has been APPROVED. You will receive a confirmation letter confirming approval of this medication. The patient will also be notified of this approval via an automated outbound phone call or a letter. Please allow approximately 2 hours to update our system with the approval. Once updated, the prescription can be re-submitted.   Coverage  Start Date: 08/14/2011 Coverage  End Date: 03/11/2012   Patient  First Name: Leslie Patient  Last Name: Davis DOB: 04-Jun-1955  Patient  Street Address: [address]  Patient City: [place] Patient State: [place] Patient Zip: [phone]   Drug Name  & Strength: [name] 145 Mcg

## 2011-12-09 ENCOUNTER — Emergency Department (HOSPITAL_COMMUNITY): Payer: 59

## 2011-12-09 ENCOUNTER — Emergency Department (HOSPITAL_COMMUNITY)
Admission: EM | Admit: 2011-12-09 | Discharge: 2011-12-09 | Disposition: A | Discharge status: Home / Self Care | Payer: 59 | Attending: Emergency Medicine | Admitting: Emergency Medicine

## 2011-12-09 ENCOUNTER — Encounter (HOSPITAL_COMMUNITY): Payer: Self-pay | Admitting: Emergency Medicine

## 2011-12-09 DIAGNOSIS — Z79899 Other long term (current) drug therapy: Secondary | ICD-10-CM | POA: Insufficient documentation

## 2011-12-09 DIAGNOSIS — R002 Palpitations: Secondary | ICD-10-CM | POA: Insufficient documentation

## 2011-12-09 DIAGNOSIS — I10 Essential (primary) hypertension: Secondary | ICD-10-CM | POA: Insufficient documentation

## 2011-12-09 DIAGNOSIS — R5381 Other malaise: Secondary | ICD-10-CM | POA: Insufficient documentation

## 2011-12-09 DIAGNOSIS — Z7982 Long term (current) use of aspirin: Secondary | ICD-10-CM | POA: Insufficient documentation

## 2011-12-09 DIAGNOSIS — R531 Weakness: Secondary | ICD-10-CM

## 2011-12-09 DIAGNOSIS — E119 Type 2 diabetes mellitus without complications: Secondary | ICD-10-CM | POA: Insufficient documentation

## 2011-12-09 DIAGNOSIS — R079 Chest pain, unspecified: Secondary | ICD-10-CM | POA: Insufficient documentation

## 2011-12-09 DIAGNOSIS — R51 Headache: Secondary | ICD-10-CM | POA: Insufficient documentation

## 2011-12-09 LAB — BASIC METABOLIC PANEL
BUN: 9 mg/dL (ref 6–23)
CO2: 24 mEq/L (ref 19–32)
Calcium: 9.7 mg/dL (ref 8.4–10.5)
Chloride: 94 mEq/L — ABNORMAL LOW (ref 96–112)
Creatinine, Ser: 0.68 mg/dL (ref 0.50–1.10)
GFR calc Af Amer: >90 mL/min (ref >90)
GFR calc non Af Amer: >90 mL/min (ref >90)
Glucose, Bld: 244 mg/dL — ABNORMAL HIGH (ref 70–99)
Potassium: 3.5 mEq/L (ref 3.5–5.1)
Sodium: 131 mEq/L — ABNORMAL LOW (ref 135–145)

## 2011-12-09 LAB — LIPASE, BLOOD: Lipase: 28 U/L (ref 11–59)

## 2011-12-09 LAB — CBC
HCT: 35.8 % — ABNORMAL LOW (ref 36.0–46.0)
Hemoglobin: 12.1 g/dL (ref 12.0–15.0)
MCH: 28.7 pg (ref 26.0–34.0)
MCHC: 33.8 g/dL (ref 30.0–36.0)
MCV: 85 fL (ref 78.0–100.0)
Platelets: 306 10*3/uL (ref 150–400)
RBC: 4.21 MIL/uL (ref 3.87–5.11)
RDW: 14.4 % (ref 11.5–15.5)
WBC: 6.2 10*3/uL (ref 4.0–10.5)

## 2011-12-09 LAB — HEPATIC FUNCTION PANEL
ALT: 35 U/L (ref 0–35)
AST: 25 U/L (ref 0–37)
Albumin: 3.8 g/dL (ref 3.5–5.2)
Alkaline Phosphatase: 103 U/L (ref 39–117)
Bilirubin, Direct: 0.1 mg/dL (ref 0.0–0.3)
Indirect Bilirubin: 0.2 mg/dL — ABNORMAL LOW (ref 0.3–0.9)
Total Bilirubin: 0.3 mg/dL (ref 0.3–1.2)
Total Protein: 7.3 g/dL (ref 6.0–8.3)

## 2011-12-09 LAB — D-DIMER, QUANTITATIVE: D-Dimer, Quant: 0.36 ug/mL-FEU (ref 0.00–0.48)

## 2011-12-09 LAB — PRO B NATRIURETIC PEPTIDE: Pro B Natriuretic peptide (BNP): 95.8 pg/mL (ref 0–125)

## 2011-12-09 LAB — POCT I-STAT TROPONIN I: Troponin i, poc: 0.01 ng/mL (ref 0.00–0.08)

## 2011-12-09 MED ORDER — DIPHENHYDRAMINE HCL 50 MG/ML IJ SOLN
25.0000 mg | Freq: Once | INTRAMUSCULAR | Status: AC
  Administered 2011-12-09: 25 mg via INTRAVENOUS
  Filled 2011-12-09 (×2): qty 1

## 2011-12-09 MED ORDER — METOCLOPRAMIDE HCL 5 MG/ML IJ SOLN
10.0000 mg | Freq: Once | INTRAMUSCULAR | Status: AC
  Administered 2011-12-09: 10 mg via INTRAVENOUS
  Filled 2011-12-09: qty 2

## 2011-12-09 MED ORDER — ASPIRIN 325 MG PO TABS
325.0000 mg | ORAL_TABLET | ORAL | Status: AC
  Administered 2011-12-09: 325 mg via ORAL
  Filled 2011-12-09: qty 1

## 2011-12-09 NOTE — ED Provider Notes (Signed)
History     CSN: [id number]  Arrival date & time 12/09/11  1636   First MD Initiated Contact with Patient 12/09/11 1750      Chief Complaint  Patient presents with  . Headache  . Chest Pain    (Consider location/radiation/quality/duration/timing/severity/associated sxs/prior treatment) Patient is a 56 y.o. female presenting with headaches and chest pain. The history is provided by the patient.  Headache   Chest Pain    patient here with heart palpitations x1 day described as a fluttering type sensation. Some associated nausea without vomiting. Denies any leg pain or swelling. Nothing makes her symptoms better worse and her symptoms last for minutes. No prior history of same. No recent fever however some cough. She also notes epigastric fullness without radiation.  Past Medical History  Diagnosis Date  . Fatty liver disease, nonalcoholic   . Gastroparesis   . Renal calculus   . Gastritis   . HTN (hypertension)   . HLD (hyperlipidemia)   . GERD (gastroesophageal reflux disease)   . DM (diabetes mellitus)   . Hiatal hernia   . Hiatal hernia     Past Surgical History  Procedure Date  . Cholecystectomy   . Tubal ligation     Family History  Problem Relation Age of Onset  . Diabetes Mother   . Diabetes Maternal Grandmother   . Stomach cancer Maternal Aunt     X 2 aunts  . Colon cancer Neg Hx     History  Substance Use Topics  . Smoking status: Former Smoker    Quit date: 09/10/1974  . Smokeless tobacco: Never Used  . Alcohol Use: No    OB History    Grav Para Term Preterm Abortions TAB SAB Ect Mult Living                  Review of Systems  Cardiovascular: Positive for chest pain.  Neurological: Positive for headaches.  All other systems reviewed and are negative.    Allergies  Review of patient's allergies indicates no known allergies.  Home Medications   Current Outpatient Rx  Name Route Sig Dispense Refill  . ASPIRIN 81 MG PO TABS Oral  Take 81 mg by mouth daily.      . INSULIN GLARGINE 100 UNIT/ML [place] SOLN Subcutaneous Inject 15 Units into the skin at bedtime.    Marland Kitchen OLMESARTAN MEDOXOMIL-HCTZ 40-25 MG PO TABS Oral Take 1 tablet by mouth daily.      Marland Kitchen PIOGLITAZONE HCL-METFORMIN HCL 15-850 MG PO TABS Oral Take 1 tablet by mouth 2 (two) times daily with a meal.      . ROSUVASTATIN CALCIUM 20 MG PO TABS Oral Take 20 mg by mouth daily.        BP 202/60  Pulse 78  Temp 98.3 F (36.8 C)  Resp 20  SpO2 100%  Physical Exam  Nursing note and vitals reviewed. Constitutional: She is oriented to person, place, and time. She appears well-developed and well-nourished.  Non-toxic appearance. No distress.  HENT:  Head: Normocephalic and atraumatic.  Eyes: Conjunctivae normal, EOM and lids are normal. Pupils are equal, round, and reactive to light.  Neck: Normal range of motion. Neck supple. No tracheal deviation present. No mass present.  Cardiovascular: Normal rate, regular rhythm and normal heart sounds.  Exam reveals no gallop.   No murmur heard. Pulmonary/Chest: Effort normal and breath sounds normal. No stridor. No respiratory distress. She has no decreased breath sounds. She has no wheezes. She has  no rhonchi. She has no rales.  Abdominal: Soft. Normal appearance and bowel sounds are normal. She exhibits no distension. There is tenderness in the epigastric area. There is no rigidity, no rebound, no guarding and no CVA tenderness.  Musculoskeletal: Normal range of motion. She exhibits no edema and no tenderness.  Neurological: She is alert and oriented to person, place, and time. She has normal strength. No cranial nerve deficit or sensory deficit. GCS eye subscore is 4. GCS verbal subscore is 5. GCS motor subscore is 6.  Skin: Skin is warm and dry. No abrasion and no rash noted.  Psychiatric: She has a normal mood and affect. Her speech is normal and behavior is normal.    ED Course  Procedures (including critical care  time)  Labs Reviewed  CBC - Abnormal; Notable for the following:    HCT 35.8 (*)     All other components within normal limits  POCT I-STAT TROPONIN I  BASIC METABOLIC PANEL  PRO B NATRIURETIC PEPTIDE  LIPASE, BLOOD  HEPATIC FUNCTION PANEL  D-DIMER, QUANTITATIVE   Dg Chest Port 1 View  12/09/2011  *RADIOLOGY REPORT*  Clinical Data: Headache, chest pain  PORTABLE CHEST - 1 VIEW  Comparison: 04/28/2009  Findings: Lungs are essentially clear.  Possible mild bronchitic changes.  No pleural effusion or pneumothorax.  Cardiomediastinal silhouette is within normal limits.  IMPRESSION: No evidence of acute cardiopulmonary disease.   Original Report Authenticated By: Charline Bills, M.D.      No diagnosis found.    MDM   Date: 12/09/2011  Rate: 75  Rhythm: normal sinus rhythm  QRS Axis: normal  Intervals: normal  ST/T Wave abnormalities: nonspecific T wave changes  Conduction Disutrbances:none  Narrative Interpretation:   Old EKG Reviewed: none available    Pt given reglan and feels better. No concern for acs or sah, repeat exam at d/c stable        Toy Baker, MD 12/09/11 1940

## 2011-12-09 NOTE — ED Notes (Signed)
[id number]<AV> Expected date:<BR> Expected time:<BR> Means of arrival:<BR> Comments:<BR> Htn; &quot;heart fluttering&quot;; nsr on monitor

## 2011-12-09 NOTE — ED Notes (Addendum)
Patient c/o of a headache and chest pain.  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  Patient is hypertensive right now 177/58.  She has a hx of hypertension and diabetes.  She does not have any history of any heart conditions or diseases.

## 2012-10-17 ENCOUNTER — Other Ambulatory Visit: Payer: Self-pay | Admitting: Gastroenterology

## 2012-10-17 DIAGNOSIS — R112 Nausea with vomiting, unspecified: Secondary | ICD-10-CM

## 2012-10-17 DIAGNOSIS — R1013 Epigastric pain: Secondary | ICD-10-CM

## 2012-12-08 ENCOUNTER — Telehealth: Payer: Self-pay | Admitting: Gastroenterology

## 2012-12-08 NOTE — Telephone Encounter (Signed)
Pt last seen 08/19/11; hx constipation, DM, GERD. At this OV pt was given [name] for her constipation with Miralax to use PRN. COLON 09/10/11 that was normal with functional constipation. lmom for pt to call back.

## 2012-12-08 NOTE — Telephone Encounter (Signed)
Pt reports she has a knot in her stomach under her breastbone; she states the knot/pain is there constantly. She reports her appetite is decreased and she has periods of nausea. She is not constipated and has regular BMs. She is not on a PPI and was previously on Aciphex. Pt will see Doug Sou, PA on 12/11/12 at 10am.

## 2012-12-11 ENCOUNTER — Encounter: Payer: Self-pay | Admitting: Gastroenterology

## 2012-12-11 ENCOUNTER — Ambulatory Visit (INDEPENDENT_AMBULATORY_CARE_PROVIDER_SITE_OTHER): Payer: 59 | Admitting: Gastroenterology

## 2012-12-11 VITALS — BP 132/70 | HR 64 | Ht 65.0 in | Wt 216.0 lb

## 2012-12-11 DIAGNOSIS — R110 Nausea: Secondary | ICD-10-CM

## 2012-12-11 DIAGNOSIS — R109 Unspecified abdominal pain: Secondary | ICD-10-CM | POA: Insufficient documentation

## 2012-12-11 DIAGNOSIS — R6881 Early satiety: Secondary | ICD-10-CM

## 2012-12-11 DIAGNOSIS — G8929 Other chronic pain: Secondary | ICD-10-CM

## 2012-12-11 DIAGNOSIS — R1013 Epigastric pain: Secondary | ICD-10-CM

## 2012-12-11 MED ORDER — RABEPRAZOLE SODIUM 20 MG PO TBEC: 20.0000 mg | DELAYED_RELEASE_TABLET | Freq: Every day | ORAL | Status: DC

## 2012-12-11 MED ORDER — ONDANSETRON 4 MG PO TBDP: 4.0000 mg | ORAL_TABLET | Freq: Three times a day (TID) | ORAL | Status: DC | PRN

## 2012-12-11 NOTE — Progress Notes (Signed)
12/11/2012 Leslie Davis [id number] [birth year]   History of Present Illness:  Patient is a pleasant 57 year old female who is a patient of Dr. Norval Gable.  She presents to our office today with complaints of epigastric pain, nausea, and feeling full.  She says that the epigastric discomfort has been an issue for at least the past two years.  Feels like there is a knot in her epigastrium at times.  Is nauseated frequently.  Feels very full after eating just small amounts.  Has been diabetic for 14 or 15 years and is on insulin for the past 5 years or so.  Had poor blood sugar control for some time in the past.  She had a GES performed in 2007, but was normal at that time.  EGD in 01/2011 showed a hiatal hernia but was otherwise normal.  She was on aciphex at that time and was supposed to continue that, but she is not taking it at this time and has not been taking it for a while.  Is s/p cholecystectomy.  No weight loss, in fact, she says that she has gained weight.  Says that BM' are normal aside from some mild constipation at times.     Current Medications, Allergies, Past Medical History, Past Surgical History, Family History and Social History were reviewed in [employer] record.   Physical Exam: BP 132/70  Pulse 64  Ht 5\' 5"  (1.651 m)  Wt 216 lb (97.977 kg)  BMI 35.94 kg/m2  SpO2 99% General: Well developed, black female in no acute distress Head: Normocephalic and atraumatic Eyes:  sclerae anicteric, conjunctiva pink  Ears: Normal auditory acuity Lungs: Clear throughout to auscultation Heart: Regular rate and rhythm Abdomen: Soft, non-distended.  BS present.  Mild epigastric TTP without R/R/G. Musculoskeletal: Symmetrical with no gross deformities  Extremities: No edema  Neurological: Alert oriented x 4, grossly nonfocal Psychological:  Alert and cooperative. Normal mood and affect  Assessment and Recommendations: -Nausea, epigastric discomfort, and early  satiety:  Likely has diabetic gastroparesis with possible component of GERD.  Will schedule GES.  Will place her back on Aciphex, which she had been on previously.  Zofran ODT prn nausea.  Will give gastroparesis dietary measures to follow in the interim, and we discussed those.  Follow up in 4-6 weeks.

## 2012-12-11 NOTE — Patient Instructions (Addendum)
You have been scheduled for a gastric emptying scan at [facility] on 12/26/2012  at 1pm . Please arrive at least 15 minutes prior to your appointment for registration. Please make certain not to have anything to eat or drink after midnight the night before your test. Hold all stomach medications (ex: Zofran, phenergan, Reglan) 48 hours prior to your test. If you need to reschedule your appointment, please contact radiology scheduling at [phone]. _____________________________________________________________________ A gastric-emptying study measures how long it takes for food to move through your stomach. There are several ways to measure stomach emptying. In the most common test, you eat food that contains a small amount of radioactive material. A scanner that detects the movement of the radioactive material is placed over your abdomen to monitor the rate at which food leaves your stomach. This test normally takes about 2 hours to complete. _____________________________________________________________________  Gastroparesis  Gastroparesis is also called slowed stomach emptying (delayed gastric emptying). It is a condition in which the stomach takes too long to empty its contents. It often happens in people with diabetes.  CAUSES  Gastroparesis happens when nerves to the stomach are damaged or stop working. When the nerves are damaged, the muscles of the stomach and intestines do not work normally. The movement of food is slowed or stopped. High blood glucose (sugar) causes changes in nerves and can damage the blood vessels that carry oxygen and nutrients to the nerves. RISK FACTORS  Diabetes.  Post-viral syndromes.  Eating disorders (anorexia, bulimia).  Surgery on the stomach or vagus nerve.  Gastroesophageal reflux disease (rarely).  Smooth muscle disorders (amyloidosis, scleroderma).  Metabolic disorders, including hypothyroidism.  Parkinson's disease. SYMPTOMS   Heartburn.  Feeling sick  to your stomach (nausea).  Vomiting of undigested food.  An early feeling of fullness when eating.  Weight loss.  Abdominal bloating.  Erratic blood glucose levels.  Lack of appetite.  Gastroesophageal reflux.  Spasms of the stomach wall. Complications can include:  Bacterial overgrowth in stomach. Food stays in the stomach and can ferment and cause bacteria to grow.  Weight loss due to difficulty digesting and absorbing nutrients.  Vomiting.  Obstruction in the stomach. Undigested food can harden and cause nausea and vomiting.  Blood glucose fluctuations caused by inconsistent food absorption. DIAGNOSIS  The diagnosis of gastroparesis is confirmed through one or more of the following tests:  Barium X-rays and scans. These tests look at how long it takes for food to move through the stomach.  Gastric manometry. This test measures electrical and muscular activity in the stomach. A thin tube is passed down the throat into the stomach. The tube contains a wire that takes measurements of the stomach's electrical and muscular activity as it digests liquids and solid food.  Endoscopy. This procedure is done with a long, thin tube called an endoscope. It is passed through the mouth and gently guides down the esophagus into the stomach. This tube helps the caregiver look at the lining of the stomach to check for any abnormalities.  Ultrasound. This can rule out gallbladder disease or pancreatitis. This test will outline and define the shape of the gallbladder and pancreas. TREATMENT   The primary treatment is to identify the problem and help control blood glucose levels. Treatments include:  Exercise.  Medicines to control nausea and vomiting.  Medicines to stimulate stomach muscles.  Changes in what and when you eat.  Having smaller meals more often.  Eating low-fiber forms of high-fiber foods, such aseating cooked vegetables instead  of raw vegetables.  Eating low-fat  foods.  Consuming liquids, which are easier to digest.  In severe cases, feeding tubes and intravenous (IV) feeding may be needed. It is important to note that in most cases, treatment does not cure gastroparesis. It is usually a lasting (chronic) condition. Treatment helps you manage the condition so that you can be as healthy and comfortable as possible. NEW TREATMENTS  A gastric neurostimulator has been developed to assist people with gastroparesis. The battery-operated device is surgically implanted. It emits mild electrical pulses to help improve stomach emptying and to control nausea and vomiting.  The use of botulinum toxin has been shown to improve stomach emptying by decreasing the prolonged contractions of the muscle between the stomach and the small intestine (pyloric sphincter). The benefits are temporary. SEEK MEDICAL CARE IF:   You are having problems keeping your blood glucose in goal range.  You are having nausea, vomiting, bloating, or early feelings of fullness with eating.  Your symptoms do not change with a change in diet. Document Released: 03/08/2005 Document Revised: 05/31/2011 Document Reviewed: 08/15/2008 [facility] Patient Information 2014 [place], [place].  Your prescriptions have been sent to your pharmacy Follow up with Dr Jarold Motto in 4-6 weeks

## 2012-12-26 ENCOUNTER — Ambulatory Visit (HOSPITAL_COMMUNITY)
Admission: RE | Admit: 2012-12-26 | Discharge: 2012-12-26 | Disposition: A | Discharge status: Home / Self Care | Payer: 59 | Source: Ambulatory Visit | Attending: Gastroenterology | Admitting: Gastroenterology

## 2012-12-26 DIAGNOSIS — R142 Eructation: Secondary | ICD-10-CM | POA: Insufficient documentation

## 2012-12-26 DIAGNOSIS — Z9089 Acquired absence of other organs: Secondary | ICD-10-CM | POA: Insufficient documentation

## 2012-12-26 DIAGNOSIS — R109 Unspecified abdominal pain: Secondary | ICD-10-CM | POA: Insufficient documentation

## 2012-12-26 DIAGNOSIS — R110 Nausea: Secondary | ICD-10-CM

## 2012-12-26 DIAGNOSIS — R141 Gas pain: Secondary | ICD-10-CM | POA: Insufficient documentation

## 2012-12-26 DIAGNOSIS — E119 Type 2 diabetes mellitus without complications: Secondary | ICD-10-CM | POA: Insufficient documentation

## 2012-12-26 MED ORDER — TECHNETIUM TC 99M SULFUR COLLOID
2.2000 | Freq: Once | INTRAVENOUS | Status: AC | PRN
  Administered 2012-12-26: 2.2 via INTRAVENOUS

## 2013-01-18 ENCOUNTER — Encounter: Payer: Self-pay | Admitting: Gastroenterology

## 2013-01-18 ENCOUNTER — Ambulatory Visit (INDEPENDENT_AMBULATORY_CARE_PROVIDER_SITE_OTHER): Payer: 59 | Admitting: Gastroenterology

## 2013-01-18 ENCOUNTER — Other Ambulatory Visit (INDEPENDENT_AMBULATORY_CARE_PROVIDER_SITE_OTHER): Payer: 59

## 2013-01-18 VITALS — BP 158/80 | HR 62 | Ht 65.0 in | Wt 210.6 lb

## 2013-01-18 DIAGNOSIS — R110 Nausea: Secondary | ICD-10-CM

## 2013-01-18 DIAGNOSIS — K7689 Other specified diseases of liver: Secondary | ICD-10-CM

## 2013-01-18 DIAGNOSIS — E669 Obesity, unspecified: Secondary | ICD-10-CM

## 2013-01-18 DIAGNOSIS — K760 Fatty (change of) liver, not elsewhere classified: Secondary | ICD-10-CM

## 2013-01-18 DIAGNOSIS — R1013 Epigastric pain: Secondary | ICD-10-CM

## 2013-01-18 DIAGNOSIS — Z9089 Acquired absence of other organs: Secondary | ICD-10-CM

## 2013-01-18 DIAGNOSIS — Z9049 Acquired absence of other specified parts of digestive tract: Secondary | ICD-10-CM

## 2013-01-18 DIAGNOSIS — E119 Type 2 diabetes mellitus without complications: Secondary | ICD-10-CM

## 2013-01-18 LAB — HEPATIC FUNCTION PANEL
ALT: 41 U/L — ABNORMAL HIGH (ref 0–35)
AST: 49 U/L — ABNORMAL HIGH (ref 0–37)
Albumin: 4 g/dL (ref 3.5–5.2)
Alkaline Phosphatase: 122 U/L — ABNORMAL HIGH (ref 39–117)
Bilirubin, Direct: 0.1 mg/dL (ref 0.0–0.3)
Total Bilirubin: 0.5 mg/dL (ref 0.3–1.2)
Total Protein: 7.9 g/dL (ref 6.0–8.3)

## 2013-01-18 LAB — CBC WITH DIFFERENTIAL/PLATELET
Basophils Absolute: 0 10*3/uL (ref 0.0–0.1)
Basophils Relative: 0.4 % (ref 0.0–3.0)
Eosinophils Absolute: 0.1 10*3/uL (ref 0.0–0.7)
Eosinophils Relative: 2 % (ref 0.0–5.0)
HCT: 37.8 % (ref 36.0–46.0)
Hemoglobin: 12.8 g/dL (ref 12.0–15.0)
Lymphocytes Relative: 35.7 % (ref 12.0–46.0)
Lymphs Abs: 2.2 10*3/uL (ref 0.7–4.0)
MCHC: 33.8 g/dL (ref 30.0–36.0)
MCV: 85.9 fl (ref 78.0–100.0)
Monocytes Absolute: 0.6 10*3/uL (ref 0.1–1.0)
Monocytes Relative: 10.6 % (ref 3.0–12.0)
Neutro Abs: 3.1 10*3/uL (ref 1.4–7.7)
Neutrophils Relative %: 51.3 % (ref 43.0–77.0)
Platelets: 303 10*3/uL (ref 150.0–400.0)
RBC: 4.4 Mil/uL (ref 3.87–5.11)
RDW: 14.7 % — ABNORMAL HIGH (ref 11.5–14.6)
WBC: 6.1 10*3/uL (ref 4.5–10.5)

## 2013-01-18 LAB — SEDIMENTATION RATE: Sed Rate: 36 mm/hr — ABNORMAL HIGH (ref 0–22)

## 2013-01-18 LAB — FERRITIN: Ferritin: 88.1 ng/mL (ref 10.0–291.0)

## 2013-01-18 LAB — IBC PANEL
Iron: 90 ug/dL (ref 42–145)
Saturation Ratios: 19.7 % — ABNORMAL LOW (ref 20.0–50.0)
Transferrin: 327 mg/dL (ref 212.0–360.0)

## 2013-01-18 LAB — VITAMIN B12: Vitamin B-12: 410 pg/mL (ref 211–911)

## 2013-01-18 LAB — LIPASE: Lipase: 28 U/L (ref 11.0–59.0)

## 2013-01-18 LAB — BASIC METABOLIC PANEL
BUN: 14 mg/dL (ref 6–23)
CO2: 28 mEq/L (ref 19–32)
Calcium: 9.8 mg/dL (ref 8.4–10.5)
Chloride: 97 mEq/L (ref 96–112)
Creatinine, Ser: 0.8 mg/dL (ref 0.4–1.2)
GFR: 98.6 mL/min (ref >60.00)
Glucose, Bld: 344 mg/dL — ABNORMAL HIGH (ref 70–99)
Potassium: 4.3 mEq/L (ref 3.5–5.1)
Sodium: 135 mEq/L (ref 135–145)

## 2013-01-18 LAB — FOLATE: Folate: 19.4 ng/mL (ref >5.9)

## 2013-01-18 LAB — AMYLASE: Amylase: 41 U/L (ref 27–131)

## 2013-01-18 LAB — TSH: TSH: 0.93 u[IU]/mL (ref 0.35–5.50)

## 2013-01-18 NOTE — Progress Notes (Addendum)
This is a 57 year old African American female with insulin-dependent diabetes that is under good control.  She continues to have a fullness and discomfort in her epigastric area which seemed to be made worse by eating.  She is status post cholecystectomy.  Last endoscopy was 2 years ago.  She denies dyspepsia or true reflux symptoms or dysphagia.  Because of symptoms that sound like gastroparesis, she recently underwent gastric emptying scan that was entirely normal.  Apparently she had ultrasound done at Dr. Allyne Gee office that showed fatty liver but otherwise was unremarkable.  We have report from ultrasound exam in 2010 that did show fatty liver but otherwise was unremarkable status post cholecystectomy.  Despite all these complaints the patient denies weight loss or any specific food intolerances, specific hepatobiliary or lower GI issues.  She is not on PPI medication but does use when necessary Zofran.  In addition to insulin she is on Actos and metformin.  She had a negative colonoscopy in June of 2013.  Patient does have chronic functional constipation probably associated with her diabetes.  She currently denies bowel problems, melena or hematochezia.  She denies abuse of alcohol, cigarettes, or NSAIDs.  Current Medications, Allergies, Past Medical History, Past Surgical History, Family History and Social History were reviewed in [employer] record.  ROS: All systems were reviewed and are negative unless otherwise stated in the HPI.          Physical Exam: Healthy-appearing patient in no distress.  Blood pressure 158/80 pulse 62 and regular weight 210 with a BMI of 59.05.  I cannot appreciate stigmata of chronic liver disease.  Her abdomen is obese without any definite organomegaly, masses or tenderness.  I cannot appreciate a succussion splash in epigastric area.  Chest is clear and she appears to be in a regular rhythm without murmurs gallops or rubs.  Mental status is  normal    Assessment and Plan: Persistent epigastric abdominal pain and a 57 year old patient with mild diabetes treated with oral medications and insulin.  She has mild obesity and does have associated fatty liver, also is status post cholecystectomy.  I will repeat her liver function tests, amylase, lipase, and CBC.  I placed her on Dexilant 60 mg a day and we'll repeat her endoscopy, obtain biopsies for celiac disease and also biopsies to exclude chronic H. pylori infection.  I cannot justify putting her on Reglan with a normal gastric emptying scan.  We will ask Dr. Allyne Gee to send this report of her recent ultrasound exam.  Copy Dr. Dorothyann Peng

## 2013-01-18 NOTE — Patient Instructions (Signed)
You have been scheduled for an endoscopy with propofol. Please follow written instructions given to you at your visit today. If you use inhalers (even only as needed), please bring them with you on the day of your procedure. Your physician has requested that you go to www.startemmi.com and enter the access code given to you at your visit today. This web site gives a general overview about your procedure. However, you should still follow specific instructions given to you by our office regarding your preparation for the procedure.  Your physician has requested that you go to the basement for the following lab work before leaving today: Liver Function Panel  Anemia  Amylase Sedimentation  Lipase

## 2013-01-19 ENCOUNTER — Encounter: Payer: Self-pay | Admitting: Gastroenterology

## 2013-01-31 ENCOUNTER — Ambulatory Visit (AMBULATORY_SURGERY_CENTER): Payer: 59 | Admitting: Gastroenterology

## 2013-01-31 ENCOUNTER — Encounter: Payer: Self-pay | Admitting: Gastroenterology

## 2013-01-31 VITALS — BP 130/70 | HR 66 | Temp 98.4°F | Resp 19 | Ht 65.0 in | Wt 210.0 lb

## 2013-01-31 DIAGNOSIS — D1330 Benign neoplasm of unspecified part of small intestine: Secondary | ICD-10-CM

## 2013-01-31 DIAGNOSIS — K2970 Gastritis, unspecified, without bleeding: Secondary | ICD-10-CM

## 2013-01-31 DIAGNOSIS — R1013 Epigastric pain: Secondary | ICD-10-CM

## 2013-01-31 DIAGNOSIS — R110 Nausea: Secondary | ICD-10-CM

## 2013-01-31 DIAGNOSIS — G8929 Other chronic pain: Secondary | ICD-10-CM

## 2013-01-31 MED ORDER — SODIUM CHLORIDE 0.9 % IV SOLN: 500.0000 mL | INTRAVENOUS | Status: DC

## 2013-01-31 NOTE — Progress Notes (Signed)
No egg or soy allergy. emw 

## 2013-01-31 NOTE — Progress Notes (Signed)
Called to room to assist during endoscopic procedure.  Patient ID and intended procedure confirmed with present staff. Received instructions for my participation in the procedure from the performing physician.  

## 2013-01-31 NOTE — Progress Notes (Signed)
Patient did not experience any of the following events: a burn prior to discharge; a fall within the facility; wrong site/side/patient/procedure/implant event; or a hospital transfer or hospital admission upon discharge from the facility. (G8907) Patient did not have preoperative order for IV antibiotic SSI prophylaxis. (G8918)  

## 2013-01-31 NOTE — Patient Instructions (Signed)
YOU HAD AN ENDOSCOPIC PROCEDURE TODAY AT THE [facility]: Refer to the procedure report that was given to you for any specific questions about what was found during the examination.  If the procedure report does not answer your questions, please call your gastroenterologist to clarify.  If you requested that your care partner not be given the details of your procedure findings, then the procedure report has been included in a sealed envelope for you to review at your convenience later.  YOU SHOULD EXPECT: Some feelings of bloating in the abdomen. Passage of more gas than usual.  Walking can help get rid of the air that was put into your GI tract during the procedure and reduce the bloating. If you had a lower endoscopy (such as a colonoscopy or flexible sigmoidoscopy) you may notice spotting of blood in your stool or on the toilet paper. If you underwent a bowel prep for your procedure, then you may not have a normal bowel movement for a few days.  DIET: Your first meal following the procedure should be a light meal and then it is ok to progress to your normal diet.  A half-sandwich or bowl of soup is an example of a good first meal.  Heavy or fried foods are harder to digest and may make you feel nauseous or bloated.  Likewise meals heavy in dairy and vegetables can cause extra gas to form and this can also increase the bloating.  Drink plenty of fluids but you should avoid alcoholic beverages for 24 hours.  ACTIVITY: Your care partner should take you home directly after the procedure.  You should plan to take it easy, moving slowly for the rest of the day.  You can resume normal activity the day after the procedure however you should NOT DRIVE or use heavy machinery for 24 hours (because of the sedation medicines used during the test).    SYMPTOMS TO REPORT IMMEDIATELY: A gastroenterologist can be reached at any hour.  During normal business hours, 8:30 AM to 5:00 PM Monday through Friday,  call [phone].  After hours and on weekends, please call the GI answering service at [phone] who will take a message and have the physician on call contact you.    Following upper endoscopy (EGD)  Vomiting of blood or coffee ground material  New chest pain or pain under the shoulder blades  Painful or persistently difficult swallowing  New shortness of breath  Fever of 100F or higher  Black, tarry-looking stools  FOLLOW UP: If any biopsies were taken you will be contacted by phone or by letter within the next 1-3 weeks.  Call your gastroenterologist if you have not heard about the biopsies in 3 weeks.  Our staff will call the home number listed on your records the next business day following your procedure to check on you and address any questions or concerns that you may have at that time regarding the information given to you following your procedure. This is a courtesy call and so if there is no answer at the home number and we have not heard from you through the emergency physician on call, we will assume that you have returned to your regular daily activities without incident.  SIGNATURES/CONFIDENTIALITY: You and/or your care partner have signed paperwork which will be entered into your electronic medical record.  These signatures attest to the fact that that the information above on your After Visit Summary has been reviewed and is understood.  Full   responsibility of the confidentiality of this discharge information lies with you and/or your care-partner.   Resume medications. Samples provided per doctor of [facility]ilant.

## 2013-01-31 NOTE — Op Note (Signed)
[facility] [address] [place], [address]   ENDOSCOPY PROCEDURE REPORT  PATIENT: [name], [name].  MR#: #[id number] BIRTHDATE: 08/22/1955 , 57  yrs. old GENDER: Female ENDOSCOPIST:David Hale Bogus, MD, Clementeen Graham REFERRED BY: Dorothyann Peng, M.D. PROCEDURE DATE:  01/31/2013 PROCEDURE:   EGD w/ biopsy and EGD w/ biopsy for H.pylori ASA CLASS:    Class II INDICATIONS: Dyspepsia, Heartburn, and Nausea. MEDICATION: propofol (Diprivan) 200mg  IV TOPICAL ANESTHETIC:   Cetacaine Spray  DESCRIPTION OF PROCEDURE:   After the risks and benefits of the procedure were explained, informed consent was obtained.  The LB [id number]  endoscope was introduced through the mouth  and advanced to the second portion of the duodenum .  The instrument was slowly withdrawn as the mucosa was fully examined.      DUODENUM: The duodenal mucosa showed no abnormalities in the bulb and second portion of the duodenum.  Cold forceps biopsies were taken in the bulb and second portion.  STOMACH: There was mild antral gastropathy noted. Erosions in antrum noted...CLO BX. done.  No retained food,heme,ulcers, etc. noted.  ESOPHAGUS: The mucosa of the esophagus appeared normal. Retroflexed views revealed no abnormalities.    The scope was then withdrawn from the patient and the procedure completed.  COMPLICATIONS: There were no complications.   ENDOSCOPIC IMPRESSION: 1.   The duodenal mucosa showed no abnormalities in the bulb and second portion of the duodenum .Marland KitchenBx. done for celiac disease screen. 2.   There was mild antral gastropathy noted [T2] .Marland KitchenMarland KitchenNSAID damage vs. H.pylori infection. 3.   The mucosa of the esophagus appeared normal 4. Nausea probably related to POOR diabetes control...BS here over 300 mg %. RECOMMENDATIONS: 1.  Await biopsy results 2.  Continue current meds 3.  Continue PPI 4.  Follow-up of helicobacter pylori status, treat if indicated 5.  Rx CLO if  positive 6. Primary care f/u per diabetes control   _______________________________ eSigned:  Mardella Layman, MD, [facility] 01/31/2013 9:11 AM   standard discharge   PATIENT NAME:  Leslie Davis. MR#: #[id number]

## 2013-01-31 NOTE — Progress Notes (Signed)
Lidocaine-40mg IV prior to Propofol InductionPropofol given over incremental dosages 

## 2013-02-01 ENCOUNTER — Telehealth: Payer: Self-pay

## 2013-02-01 NOTE — Telephone Encounter (Signed)
Called [phone] and left a message for the pt to call if any questions or concerns. Maw

## 2013-02-02 ENCOUNTER — Encounter: Payer: Self-pay | Admitting: Gastroenterology

## 2013-02-02 LAB — HELICOBACTER PYLORI SCREEN-BIOPSY: UREASE: NEGATIVE

## 2013-02-06 ENCOUNTER — Encounter: Payer: Self-pay | Admitting: Gastroenterology

## 2013-03-22 ENCOUNTER — Encounter (HOSPITAL_COMMUNITY): Payer: Self-pay | Admitting: Emergency Medicine

## 2013-03-22 ENCOUNTER — Emergency Department (HOSPITAL_COMMUNITY)
Admission: EM | Admit: 2013-03-22 | Discharge: 2013-03-22 | Disposition: A | Discharge status: Home / Self Care | Payer: 59 | Attending: Emergency Medicine | Admitting: Emergency Medicine

## 2013-03-22 DIAGNOSIS — I10 Essential (primary) hypertension: Secondary | ICD-10-CM | POA: Insufficient documentation

## 2013-03-22 DIAGNOSIS — R1012 Left upper quadrant pain: Secondary | ICD-10-CM | POA: Insufficient documentation

## 2013-03-22 DIAGNOSIS — Z87891 Personal history of nicotine dependence: Secondary | ICD-10-CM | POA: Insufficient documentation

## 2013-03-22 DIAGNOSIS — Z8719 Personal history of other diseases of the digestive system: Secondary | ICD-10-CM | POA: Insufficient documentation

## 2013-03-22 DIAGNOSIS — Z87442 Personal history of urinary calculi: Secondary | ICD-10-CM | POA: Insufficient documentation

## 2013-03-22 DIAGNOSIS — R110 Nausea: Secondary | ICD-10-CM | POA: Insufficient documentation

## 2013-03-22 DIAGNOSIS — R197 Diarrhea, unspecified: Secondary | ICD-10-CM

## 2013-03-22 DIAGNOSIS — R1011 Right upper quadrant pain: Secondary | ICD-10-CM | POA: Insufficient documentation

## 2013-03-22 DIAGNOSIS — Z79899 Other long term (current) drug therapy: Secondary | ICD-10-CM | POA: Insufficient documentation

## 2013-03-22 DIAGNOSIS — R1013 Epigastric pain: Secondary | ICD-10-CM | POA: Insufficient documentation

## 2013-03-22 DIAGNOSIS — E119 Type 2 diabetes mellitus without complications: Secondary | ICD-10-CM | POA: Insufficient documentation

## 2013-03-22 DIAGNOSIS — R739 Hyperglycemia, unspecified: Secondary | ICD-10-CM

## 2013-03-22 DIAGNOSIS — E785 Hyperlipidemia, unspecified: Secondary | ICD-10-CM | POA: Insufficient documentation

## 2013-03-22 DIAGNOSIS — R51 Headache: Secondary | ICD-10-CM | POA: Insufficient documentation

## 2013-03-22 DIAGNOSIS — Z794 Long term (current) use of insulin: Secondary | ICD-10-CM | POA: Insufficient documentation

## 2013-03-22 LAB — BASIC METABOLIC PANEL
BUN: 11 mg/dL (ref 6–23)
CO2: 25 mEq/L (ref 19–32)
Calcium: 9.9 mg/dL (ref 8.4–10.5)
Chloride: 97 mEq/L (ref 96–112)
Creatinine, Ser: 0.87 mg/dL (ref 0.50–1.10)
GFR calc Af Amer: 83 mL/min — ABNORMAL LOW (ref >90)
GFR calc non Af Amer: 72 mL/min — ABNORMAL LOW (ref >90)
Glucose, Bld: 377 mg/dL — ABNORMAL HIGH (ref 70–99)
Potassium: 4.5 mEq/L (ref 3.7–5.3)
Sodium: 138 mEq/L (ref 137–147)

## 2013-03-22 LAB — CBC WITH DIFFERENTIAL/PLATELET
Basophils Absolute: 0 10*3/uL (ref 0.0–0.1)
Basophils Relative: 0 % (ref 0–1)
Eosinophils Absolute: 0.1 10*3/uL (ref 0.0–0.7)
Eosinophils Relative: 2 % (ref 0–5)
HCT: 38.1 % (ref 36.0–46.0)
Hemoglobin: 13.1 g/dL (ref 12.0–15.0)
Lymphocytes Relative: 39 % (ref 12–46)
Lymphs Abs: 2.1 10*3/uL (ref 0.7–4.0)
MCH: 28.7 pg (ref 26.0–34.0)
MCHC: 34.4 g/dL (ref 30.0–36.0)
MCV: 83.4 fL (ref 78.0–100.0)
Monocytes Absolute: 0.8 10*3/uL (ref 0.1–1.0)
Monocytes Relative: 14 % — ABNORMAL HIGH (ref 3–12)
Neutro Abs: 2.4 10*3/uL (ref 1.7–7.7)
Neutrophils Relative %: 45 % (ref 43–77)
Platelets: 321 10*3/uL (ref 150–400)
RBC: 4.57 MIL/uL (ref 3.87–5.11)
RDW: 14.2 % (ref 11.5–15.5)
WBC: 5.4 10*3/uL (ref 4.0–10.5)

## 2013-03-22 LAB — URINALYSIS, ROUTINE W REFLEX MICROSCOPIC
Bilirubin Urine: NEGATIVE
Glucose, UA: >1000 mg/dL — AB
Hgb urine dipstick: NEGATIVE
Ketones, ur: NEGATIVE mg/dL
Nitrite: NEGATIVE
Protein, ur: NEGATIVE mg/dL
Specific Gravity, Urine: 1.022 (ref 1.005–1.030)
Urobilinogen, UA: 0.2 mg/dL (ref 0.0–1.0)
pH: 5.5 (ref 5.0–8.0)

## 2013-03-22 LAB — URINE MICROSCOPIC-ADD ON

## 2013-03-22 LAB — GLUCOSE, CAPILLARY
Glucose-Capillary: 326 mg/dL — ABNORMAL HIGH (ref 70–99)
Glucose-Capillary: 238 mg/dL — ABNORMAL HIGH (ref 70–99)

## 2013-03-22 LAB — LIPASE, BLOOD: Lipase: 33 U/L (ref 11–59)

## 2013-03-22 MED ORDER — SODIUM CHLORIDE 0.9 % IV BOLUS (SEPSIS)
1000.0000 mL | Freq: Once | INTRAVENOUS | Status: AC
  Administered 2013-03-22: 1000 mL via INTRAVENOUS

## 2013-03-22 NOTE — ED Provider Notes (Signed)
CSN: [id number]     Arrival date & time 03/22/13  1415 History   First MD Initiated Contact with Patient 03/22/13 1517     Chief Complaint  Patient presents with  . Hyperglycemia   (Consider location/radiation/quality/duration/timing/severity/associated sxs/prior Treatment) Patient is a 58 y.o. female presenting with hyperglycemia.  Hyperglycemia Associated symptoms: no dysuria    58 yo female presents with hyperglycemia, nausea, diarrhea x 4 days. PMH significant for Type 2 DM, GERD, Gastritis, Gastroparesis, HLD, and HTN. Patient states she ran out of her insulin last Friday. Refilled and restarted insulin on Tuesday. Patient reports persistently elevated Hyperglycemia despite initiation of insulin therapy. Patient also admits to recent change in diet, has been drinking NON-Diet sodas and reports having some ice cream recently. Patient admits to some epigastric pain that she describes as sharp intermittent pains that last a couple minutes, rated at a 4/10 and are associated with eating. Patient admits to watery diarrhea about 5 episodes since Monday that also seem to be associated with her meals. Patient states her metformin also causes similar symptoms. Patient's spouse is at bedside and seems most concerned about patient's consistently elevated Blood sugar. Denies CP, Dyspnea, Fever, Chills, Vomiting.  Patient reports hx of cholecystectomy. Denies alcohol or drug use.   Past Medical History  Diagnosis Date  . Fatty liver disease, nonalcoholic   . Gastroparesis   . Renal calculus   . Gastritis   . HTN (hypertension)   . HLD (hyperlipidemia)   . GERD (gastroesophageal reflux disease)   . DM (diabetes mellitus)   . Hiatal hernia   . Hiatal hernia    Past Surgical History  Procedure Laterality Date  . Cholecystectomy    . Tubal ligation    . Colonoscopy     Family History  Problem Relation Age of Onset  . Diabetes Mother   . Diabetes Maternal Grandmother   . Stomach cancer  Maternal Aunt     X 2 aunts  . Colon cancer Neg Hx    History  Substance Use Topics  . Smoking status: Former Smoker    Quit date: 09/10/1974  . Smokeless tobacco: Never Used  . Alcohol Use: No   OB History   Grav Para Term Preterm Abortions TAB SAB Ect Mult Living                 Review of Systems  HENT: Negative for congestion, ear pain, sinus pressure and sore throat.   Eyes: Negative for photophobia and visual disturbance.  Respiratory: Negative for cough.   Cardiovascular: Negative for palpitations.  Gastrointestinal: Negative for blood in stool.  Genitourinary: Negative for dysuria and hematuria.  Musculoskeletal: Negative for neck pain and neck stiffness.  Skin: Negative for rash.  Neurological: Positive for headaches (mild intermittent HAs).    Allergies  Review of patient's allergies indicates no known allergies.  Home Medications   Current Outpatient Rx  Name  Route  Sig  Dispense  Refill  . insulin detemir (LEVEMIR) 100 UNIT/ML injection   Subcutaneous   Inject 28 Units into the skin at bedtime.          Marland Kitchen olmesartan-hydrochlorothiazide (BENICAR HCT) 40-25 MG per tablet   Oral   Take 1 tablet by mouth daily.          . ondansetron (ZOFRAN ODT) 4 MG disintegrating tablet   Oral   Take 1 tablet (4 mg total) by mouth every 8 (eight) hours as needed for nausea.   30 tablet  1   . pioglitazone-metformin (ACTOPLUS MET) 15-850 MG per tablet   Oral   Take 1 tablet by mouth 2 (two) times daily with a meal.           . rosuvastatin (CRESTOR) 20 MG tablet   Oral   Take 20 mg by mouth daily.           BP 164/68  Pulse 63  Resp 18  SpO2 100% Physical Exam  Nursing note and vitals reviewed. Constitutional: She is oriented to person, place, and time. She appears well-developed and well-nourished. No distress.  HENT:  Head: Normocephalic and atraumatic.  Eyes: Conjunctivae and EOM are normal. Pupils are equal, round, and reactive to light. No  scleral icterus.  Neck: Normal range of motion. Neck supple. No JVD present.  Cardiovascular: Normal rate, regular rhythm and normal heart sounds.  Exam reveals no gallop and no friction rub.   No murmur heard. Pulmonary/Chest: Effort normal and breath sounds normal. No respiratory distress. She has no wheezes. She has no rales.  Abdominal: Soft. Bowel sounds are normal. She exhibits no distension and no mass. There is tenderness in the right upper quadrant, epigastric area and left upper quadrant. There is no rigidity, no rebound, no guarding, no tenderness at McBurney's point and negative Murphy's sign.  Musculoskeletal: Normal range of motion. She exhibits no edema.  Neurological: She is alert and oriented to person, place, and time.  Skin: Skin is warm and dry. She is not diaphoretic.  Psychiatric: She has a normal mood and affect. Her behavior is normal.    ED Course  Procedures (including critical care time) Labs Review Labs Reviewed  GLUCOSE, CAPILLARY - Abnormal; Notable for the following:    Glucose-Capillary 326 (*)    All other components within normal limits  CBC WITH DIFFERENTIAL - Abnormal; Notable for the following:    Monocytes Relative 14 (*)    All other components within normal limits  BASIC METABOLIC PANEL - Abnormal; Notable for the following:    Glucose, Bld 377 (*)    GFR calc non Af Amer 72 (*)    GFR calc Af Amer 83 (*)    All other components within normal limits  URINALYSIS, ROUTINE W REFLEX MICROSCOPIC - Abnormal; Notable for the following:    APPearance CLOUDY (*)    Glucose, UA >1000 (*)    Leukocytes, UA SMALL (*)    All other components within normal limits  GLUCOSE, CAPILLARY - Abnormal; Notable for the following:    Glucose-Capillary 238 (*)    All other components within normal limits  LIPASE, BLOOD  URINE MICROSCOPIC-ADD ON   Imaging Review No results found.  EKG Interpretation   None       MDM   1. Hyperglycemia   2. Nausea   3.  Diarrhea    CBG elevated at 326 and improved to 238 with 1 liter of IV fluids. UA shows no Ketonuria. Lipase normal. Suspect  Patient's sxs related to recent dietary changes. Sounds like patient has had poor dietary choices over the holidays with mention of multiple non diet sodas recently. Patient denies nausea during exam. States sxs have improved some since receiving IV fluids.   Discussed labs, and exam findings with patient. Advised follow up with your PCP in 2 days to recheck blood sugar and further maintenance of Type 2 Diabetes.  Recommend return to ED should symptoms worsen. Patient agrees with plan. Discharged in good condition.    Meds given  in ED:  Medications  sodium chloride 0.9 % bolus 1,000 mL (0 mLs Intravenous Stopped 03/22/13 1719)    Discharge Medication List as of 03/22/2013  5:59 PM            Sherrie George, PA-C 03/23/13 0134

## 2013-03-22 NOTE — Discharge Instructions (Signed)
Follow up with your PCP in 2 days to recheck blood sugar and further maintenance of Type 2 Diabetes. Return to ED should symptoms progressively worsen.

## 2013-03-22 NOTE — ED Notes (Signed)
Pt has critical high sugar at home and states that she does not feel well. Alert x4, cbg 326.

## 2013-03-24 NOTE — ED Provider Notes (Signed)
Medical screening examination/treatment/procedure(s) were performed by non-physician practitioner and as supervising physician I was immediately available for consultation/collaboration.  Richarda Blade, MD 03/24/13 [phone]

## 2013-07-11 ENCOUNTER — Other Ambulatory Visit (HOSPITAL_COMMUNITY): Payer: Self-pay | Admitting: Internal Medicine

## 2013-07-11 DIAGNOSIS — Z1231 Encounter for screening mammogram for malignant neoplasm of breast: Secondary | ICD-10-CM

## 2013-07-16 ENCOUNTER — Ambulatory Visit (HOSPITAL_COMMUNITY)
Admission: RE | Admit: 2013-07-16 | Discharge: 2013-07-16 | Disposition: A | Discharge status: Home / Self Care | Payer: 59 | Source: Ambulatory Visit | Attending: Internal Medicine | Admitting: Internal Medicine

## 2013-07-16 DIAGNOSIS — Z1231 Encounter for screening mammogram for malignant neoplasm of breast: Secondary | ICD-10-CM | POA: Insufficient documentation

## 2014-02-15 ENCOUNTER — Emergency Department (HOSPITAL_COMMUNITY): Payer: 59

## 2014-02-15 ENCOUNTER — Emergency Department (HOSPITAL_COMMUNITY)
Admission: EM | Admit: 2014-02-15 | Discharge: 2014-02-16 | Disposition: A | Discharge status: Home / Self Care | Payer: 59 | Attending: Emergency Medicine | Admitting: Emergency Medicine

## 2014-02-15 ENCOUNTER — Encounter (HOSPITAL_COMMUNITY): Payer: Self-pay

## 2014-02-15 DIAGNOSIS — E119 Type 2 diabetes mellitus without complications: Secondary | ICD-10-CM | POA: Insufficient documentation

## 2014-02-15 DIAGNOSIS — Z9851 Tubal ligation status: Secondary | ICD-10-CM | POA: Diagnosis not present

## 2014-02-15 DIAGNOSIS — E785 Hyperlipidemia, unspecified: Secondary | ICD-10-CM | POA: Insufficient documentation

## 2014-02-15 DIAGNOSIS — Z79899 Other long term (current) drug therapy: Secondary | ICD-10-CM | POA: Insufficient documentation

## 2014-02-15 DIAGNOSIS — Z9049 Acquired absence of other specified parts of digestive tract: Secondary | ICD-10-CM | POA: Diagnosis not present

## 2014-02-15 DIAGNOSIS — Z87442 Personal history of urinary calculi: Secondary | ICD-10-CM | POA: Insufficient documentation

## 2014-02-15 DIAGNOSIS — R1010 Upper abdominal pain, unspecified: Secondary | ICD-10-CM

## 2014-02-15 DIAGNOSIS — K2970 Gastritis, unspecified, without bleeding: Secondary | ICD-10-CM | POA: Diagnosis not present

## 2014-02-15 DIAGNOSIS — Z794 Long term (current) use of insulin: Secondary | ICD-10-CM | POA: Insufficient documentation

## 2014-02-15 DIAGNOSIS — Z87891 Personal history of nicotine dependence: Secondary | ICD-10-CM | POA: Insufficient documentation

## 2014-02-15 DIAGNOSIS — I10 Essential (primary) hypertension: Secondary | ICD-10-CM | POA: Diagnosis not present

## 2014-02-15 DIAGNOSIS — R112 Nausea with vomiting, unspecified: Secondary | ICD-10-CM | POA: Diagnosis present

## 2014-02-15 LAB — CBC WITH DIFFERENTIAL/PLATELET
Basophils Absolute: 0 10*3/uL (ref 0.0–0.1)
Basophils Relative: 0 % (ref 0–1)
Eosinophils Absolute: 0.1 10*3/uL (ref 0.0–0.7)
Eosinophils Relative: 1 % (ref 0–5)
HCT: 37.3 % (ref 36.0–46.0)
Hemoglobin: 12.4 g/dL (ref 12.0–15.0)
Lymphocytes Relative: 7 % — ABNORMAL LOW (ref 12–46)
Lymphs Abs: 0.6 10*3/uL — ABNORMAL LOW (ref 0.7–4.0)
MCH: 28.4 pg (ref 26.0–34.0)
MCHC: 33.2 g/dL (ref 30.0–36.0)
MCV: 85.6 fL (ref 78.0–100.0)
Monocytes Absolute: 0.5 10*3/uL (ref 0.1–1.0)
Monocytes Relative: 7 % (ref 3–12)
Neutro Abs: 6.5 10*3/uL (ref 1.7–7.7)
Neutrophils Relative %: 85 % — ABNORMAL HIGH (ref 43–77)
Platelets: 296 10*3/uL (ref 150–400)
RBC: 4.36 MIL/uL (ref 3.87–5.11)
RDW: 14.9 % (ref 11.5–15.5)
WBC: 7.7 10*3/uL (ref 4.0–10.5)

## 2014-02-15 LAB — COMPREHENSIVE METABOLIC PANEL
ALT: 20 U/L (ref 0–35)
AST: 25 U/L (ref 0–37)
Albumin: 3.7 g/dL (ref 3.5–5.2)
Alkaline Phosphatase: 87 U/L (ref 39–117)
Anion gap: 16 — ABNORMAL HIGH (ref 5–15)
BUN: 18 mg/dL (ref 6–23)
CO2: 21 mEq/L (ref 19–32)
Calcium: 9.5 mg/dL (ref 8.4–10.5)
Chloride: 101 mEq/L (ref 96–112)
Creatinine, Ser: 0.67 mg/dL (ref 0.50–1.10)
GFR calc Af Amer: >90 mL/min (ref >90)
GFR calc non Af Amer: >90 mL/min (ref >90)
Glucose, Bld: 194 mg/dL — ABNORMAL HIGH (ref 70–99)
Potassium: 4 mEq/L (ref 3.7–5.3)
Sodium: 138 mEq/L (ref 137–147)
Total Bilirubin: 0.4 mg/dL (ref 0.3–1.2)
Total Protein: 7.9 g/dL (ref 6.0–8.3)

## 2014-02-15 LAB — LIPASE, BLOOD: Lipase: 58 U/L (ref 11–59)

## 2014-02-15 LAB — CBG MONITORING, ED: Glucose-Capillary: 177 mg/dL — ABNORMAL HIGH (ref 70–99)

## 2014-02-15 LAB — I-STAT TROPONIN, ED: Troponin i, poc: 0 ng/mL (ref 0.00–0.08)

## 2014-02-15 MED ORDER — PANTOPRAZOLE SODIUM 40 MG IV SOLR
40.0000 mg | Freq: Once | INTRAVENOUS | Status: AC
  Administered 2014-02-15: 40 mg via INTRAVENOUS
  Filled 2014-02-15: qty 40

## 2014-02-15 MED ORDER — HYDROMORPHONE HCL 1 MG/ML IJ SOLN
1.0000 mg | Freq: Once | INTRAMUSCULAR | Status: AC
  Administered 2014-02-15: 1 mg via INTRAVENOUS
  Filled 2014-02-15: qty 1

## 2014-02-15 MED ORDER — ONDANSETRON HCL 4 MG/2ML IJ SOLN
4.0000 mg | Freq: Once | INTRAMUSCULAR | Status: AC
  Administered 2014-02-15: 4 mg via INTRAVENOUS
  Filled 2014-02-15: qty 2

## 2014-02-15 MED ORDER — SODIUM CHLORIDE 0.9 % IV BOLUS (SEPSIS)
1000.0000 mL | Freq: Once | INTRAVENOUS | Status: AC
  Administered 2014-02-15: 1000 mL via INTRAVENOUS

## 2014-02-15 NOTE — ED Notes (Signed)
Pt states abdominal pain and vomiting since this morning.  No fever.  No diarrhea.  No change in urination.

## 2014-02-15 NOTE — ED Provider Notes (Signed)
CSN: [id number]     Arrival date & time 02/15/14  1809 History   First MD Initiated Contact with Patient 02/15/14 2057     Chief Complaint  Patient presents with  . Abdominal Pain  . Emesis     (Consider location/radiation/quality/duration/timing/severity/associated sxs/prior Treatment) HPI Mrs. Revell is a 58 year old female with past medical history of fatty liver disease, gastroparesis, gastritis, GERD, diabetes, hiatal hernia who presents the ER complaining of upper abdominal pain, nausea, vomiting. Patient reports her symptoms began acutely this morning when she woke up. Patient states her symptoms have persisted throughout the day. Patient describes her pain as a sharp, constant pain which is a 9 out of 10. Patient states her pain is in her epigastrium, and her left upper quadrant. She states her pain does not radiate. She reports associated nausea, and 4-5 episodes of nonbilious, nonbloody vomiting. Patient denies having any associated chest pain, shortness of breath, weakness, dizziness, fever, diarrhea, hematochezia, melena, dysuria, vaginal discharge, vaginal bleeding, vaginal pain.  Past Medical History  Diagnosis Date  . Fatty liver disease, nonalcoholic   . Gastroparesis   . Renal calculus   . Gastritis   . HTN (hypertension)   . HLD (hyperlipidemia)   . GERD (gastroesophageal reflux disease)   . DM (diabetes mellitus)   . Hiatal hernia   . Hiatal hernia    Past Surgical History  Procedure Laterality Date  . Cholecystectomy    . Tubal ligation    . Colonoscopy     Family History  Problem Relation Age of Onset  . Diabetes Mother   . Diabetes Maternal Grandmother   . Stomach cancer Maternal Aunt     X 2 aunts  . Colon cancer Neg Hx    History  Substance Use Topics  . Smoking status: Former Smoker    Quit date: 09/10/1974  . Smokeless tobacco: Never Used  . Alcohol Use: No   OB History    No data available     Review of Systems  Constitutional:  Negative for fever.  HENT: Negative for trouble swallowing.   Eyes: Negative for visual disturbance.  Respiratory: Negative for shortness of breath.   Cardiovascular: Negative for chest pain.  Gastrointestinal: Positive for nausea, vomiting and abdominal pain.  Genitourinary: Negative for dysuria.  Musculoskeletal: Negative for neck pain.  Skin: Negative for rash.  Neurological: Negative for dizziness, weakness and numbness.  Psychiatric/Behavioral: Negative.       Allergies  Review of patient's allergies indicates no known allergies.  Home Medications   Prior to Admission medications   Medication Sig Start Date End Date Taking? Authorizing Provider  BENICAR HCT 20-12.5 MG per tablet Take 1 tablet by mouth daily.  02/12/14  Yes Historical Provider, MD  insulin detemir (LEVEMIR) 100 UNIT/ML injection Inject 32 Units into the skin at bedtime.    Yes Historical Provider, MD  pioglitazone-metformin (ACTOPLUS MET) 15-850 MG per tablet Take 1 tablet by mouth 2 (two) times daily with a meal.     Yes Historical Provider, MD  rosuvastatin (CRESTOR) 20 MG tablet Take 20 mg by mouth daily.    Yes Historical Provider, MD  olmesartan-hydrochlorothiazide (BENICAR HCT) 40-25 MG per tablet Take 1 tablet by mouth daily.     Historical Provider, MD  ondansetron (ZOFRAN ODT) 4 MG disintegrating tablet Take 1 tablet (4 mg total) by mouth every 8 (eight) hours as needed for nausea. Patient not taking: Reported on 02/15/2014 12/11/12   Janett Billow D. Zehr, PA-C  ondansetron (ZOFRAN)  4 MG tablet Take 1 tablet (4 mg total) by mouth 2 (two) times daily. 02/16/14   Carrie Mew, PA-C  oxyCODONE-acetaminophen (PERCOCET) 5-325 MG per tablet Take 1-2 tablets by mouth every 6 (six) hours as needed. 02/16/14   Carrie Mew, PA-C  pantoprazole (PROTONIX) 20 MG tablet Take 1 tablet (20 mg total) by mouth daily. 02/16/14   Carrie Mew, PA-C   BP 172/69 mmHg  Pulse 68  Temp(Src) 98.1 F (36.7 C) (Oral)  Resp 18   SpO2 99% Physical Exam  Constitutional: She is oriented to person, place, and time. She appears well-developed and well-nourished. No distress.  HENT:  Head: Normocephalic and atraumatic.  Mouth/Throat: Oropharynx is clear and moist. No oropharyngeal exudate.  Eyes: Right eye exhibits no discharge. Left eye exhibits no discharge. No scleral icterus.  Neck: Normal range of motion.  Cardiovascular: Normal rate, regular rhythm and normal heart sounds.   No murmur heard. Pulmonary/Chest: Effort normal and breath sounds normal. No respiratory distress.  Abdominal: Soft. Normal appearance and bowel sounds are normal. There is tenderness in the epigastric area and left upper quadrant. There is no rigidity, no guarding, no tenderness at McBurney's point and negative Murphy's sign.  Musculoskeletal: Normal range of motion. She exhibits no edema or tenderness.  Neurological: She is alert and oriented to person, place, and time. No cranial nerve deficit. Coordination normal. GCS eye subscore is 4. GCS verbal subscore is 5. GCS motor subscore is 6.  Patient fully alert answering questions appropriately in full, clear sentences. Cranial nerves II through XII grossly intact. Motor strength 5 out of 5 in all major muscle groups of upper and lower extremities. Distal sensation intact.  Skin: Skin is warm and dry. No rash noted. She is not diaphoretic.  Psychiatric: She has a normal mood and affect.  Nursing note and vitals reviewed.   ED Course  Procedures (including critical care time) Labs Review Labs Reviewed  CBC WITH DIFFERENTIAL - Abnormal; Notable for the following:    Neutrophils Relative % 85 (*)    Lymphocytes Relative 7 (*)    Lymphs Abs 0.6 (*)    All other components within normal limits  COMPREHENSIVE METABOLIC PANEL - Abnormal; Notable for the following:    Glucose, Bld 194 (*)    Anion gap 16 (*)    All other components within normal limits  URINALYSIS, ROUTINE W REFLEX MICROSCOPIC  - Abnormal; Notable for the following:    Leukocytes, UA MODERATE (*)    All other components within normal limits  CBG MONITORING, ED - Abnormal; Notable for the following:    Glucose-Capillary 177 (*)    All other components within normal limits  LIPASE, BLOOD  URINE MICROSCOPIC-ADD ON  I-STAT TROPOININ, ED    Imaging Review Dg Abd Acute W/chest  02/15/2014   CLINICAL DATA:  Mid upper abdominal pain, nausea and vomiting for 1 day. Acute onset of symptoms. Initial encounter.  EXAM: ACUTE ABDOMEN SERIES (ABDOMEN 2 VIEW & CHEST 1 VIEW)  COMPARISON:  Chest radiograph performed 12/09/2011  FINDINGS: The lungs are well-aerated and clear. There is no evidence of focal opacification, pleural effusion or pneumothorax. The cardiomediastinal silhouette is within normal limits.  The visualized bowel gas pattern is unremarkable. Scattered stool and air are seen within the colon; there is no evidence of small bowel dilatation to suggest obstruction. No free intra-abdominal air is identified on the provided upright view. Clips are noted within the right upper quadrant, reflecting prior cholecystectomy.  No acute osseous abnormalities are seen; the sacroiliac joints are unremarkable in appearance.  IMPRESSION: 1. Unremarkable bowel gas pattern; no free intra-abdominal air seen. 2. No acute cardiopulmonary process identified.   Electronically Signed   By: Garald Balding M.D.   On: 02/15/2014 22:38     EKG Interpretation   Date/Time:  Friday February 15 2014 23:43:08 EST Ventricular Rate:  61 PR Interval:  150 QRS Duration: 82 QT Interval:  410 QTC Calculation: 413 R Axis:   20 Text Interpretation:  Sinus rhythm Nonspecific T abnormalities, lateral  leads Confirmed by Hazle Coca [phone]) on 02/16/2014 12:02:49 AM      MDM   Final diagnoses:  Upper abdominal pain  Gastritis   Patient here complaining of upper abdominal pain. Exam with tenderness to epigastric region and left upper quadrant.  Patient reporting having history of gastritis, GERD the past. Patient had a normal endoscopy 2 months ago. Patient status post cholecystectomy. Patient denying any aggravating or alleviating factors to her pain. Patient states she did not try eating anything today because of the constant pain. Exam and etiology of her pain consistent with a possible gastritis. Symptomatic therapy in the ER. Will follow with abdominal upright x-ray to previous abdominal surgeries for rule out of obstruction.   ACS workup for possible cardiac etiology of epigastric pain negative. Troponin negative. EKG unremarkable for T-wave inversion in leads V4 through V6. This was a change from previous EKG, however may be physiologic due to the fact the patient is not expressing any chest pain or shortness of breath. Patient strongly encouraged follow-up with her primary care physician regarding this change, and given strict return precautions to return to the ER if any signs or symptoms of cardiac etiology. No leukocytosis, anemia, electrolyte abnormality. Urinalysis unremarkable for any acute pathology. Lipase within normal limits.   Patient is nontoxic, nonseptic appearing, in no apparent distress.  Patient's pain and other symptoms adequately managed in emergency department.  Fluid bolus given.  Labs, imaging and vitals reviewed.  Patient does not meet the SIRS or Sepsis criteria.  On repeat exam patient does not have a surgical abdomin and there are no peritoneal signs.  No indication of appendicitis, bowel obstruction, bowel perforation, cholecystitis, diverticulitis, PID or ectopic pregnancy.  Patient discharged home with symptomatic treatment and given strict instructions for follow-up with their primary care physician.  I have also discussed reasons to return immediately to the ER.  Patient expresses understanding and agrees with plan. I strongly encouraged patient to follow up with her gastroenterologist regarding this pain.  BP  172/69 mmHg  Pulse 68  Temp(Src) 98.1 F (36.7 C) (Oral)  Resp 18  SpO2 99%  Signed,  Dahlia Bailiff, PA-C 1:10 AM  Patient seen and discussed with Dr. Quintella Reichert, MD   Carrie Mew, PA-C 02/16/14 0111  Quintella Reichert, MD 02/16/14 [phone]

## 2014-02-16 LAB — URINALYSIS, ROUTINE W REFLEX MICROSCOPIC
Bilirubin Urine: NEGATIVE
Glucose, UA: NEGATIVE mg/dL
Hgb urine dipstick: NEGATIVE
Ketones, ur: NEGATIVE mg/dL
Nitrite: NEGATIVE
Protein, ur: NEGATIVE mg/dL
Specific Gravity, Urine: 1.019 (ref 1.005–1.030)
Urobilinogen, UA: 0.2 mg/dL (ref 0.0–1.0)
pH: 5.5 (ref 5.0–8.0)

## 2014-02-16 LAB — URINE MICROSCOPIC-ADD ON

## 2014-02-16 MED ORDER — OXYCODONE-ACETAMINOPHEN 5-325 MG PO TABS: 1.0000 | ORAL_TABLET | Freq: Four times a day (QID) | ORAL | Status: DC | PRN

## 2014-02-16 MED ORDER — GI COCKTAIL ~~LOC~~: 30.0000 mL | Freq: Once | ORAL | Status: DC

## 2014-02-16 MED ORDER — ONDANSETRON HCL 4 MG PO TABS: 4.0000 mg | ORAL_TABLET | Freq: Two times a day (BID) | ORAL | Status: DC

## 2014-02-16 MED ORDER — PANTOPRAZOLE SODIUM 20 MG PO TBEC: 20.0000 mg | DELAYED_RELEASE_TABLET | Freq: Every day | ORAL | Status: DC

## 2014-02-16 NOTE — ED Notes (Signed)
Pt tolerated PO fluids, MD notifited

## 2014-02-16 NOTE — Discharge Instructions (Signed)
Follow-up with your gastroenterologist regarding your abdominal pain and nausea. Follow-up with your primary care doctor regarding your EKG tonight. Return to the ER with any high fever, severe abdominal pain, nausea, vomiting, chest pain, shortness of breath.   Gastritis, Adult Gastritis is soreness and swelling (inflammation) of the lining of the stomach. Gastritis can develop as a sudden onset (acute) or long-term (chronic) condition. If gastritis is not treated, it can lead to stomach bleeding and ulcers. CAUSES  Gastritis occurs when the stomach lining is weak or damaged. Digestive juices from the stomach then inflame the weakened stomach lining. The stomach lining may be weak or damaged due to viral or bacterial infections. One common bacterial infection is the Helicobacter pylori infection. Gastritis can also result from excessive alcohol consumption, taking certain medicines, or having too much acid in the stomach.  SYMPTOMS  In some cases, there are no symptoms. When symptoms are present, they may include:  Pain or a burning sensation in the upper abdomen.  Nausea.  Vomiting.  An uncomfortable feeling of fullness after eating. DIAGNOSIS  Your caregiver may suspect you have gastritis based on your symptoms and a physical exam. To determine the cause of your gastritis, your caregiver may perform the following:  Blood or stool tests to check for the H pylori bacterium.  Gastroscopy. A thin, flexible tube (endoscope) is passed down the esophagus and into the stomach. The endoscope has a light and camera on the end. Your caregiver uses the endoscope to view the inside of the stomach.  Taking a tissue sample (biopsy) from the stomach to examine under a microscope. TREATMENT  Depending on the cause of your gastritis, medicines may be prescribed. If you have a bacterial infection, such as an H pylori infection, antibiotics may be given. If your gastritis is caused by too much acid in the  stomach, H2 blockers or antacids may be given. Your caregiver may recommend that you stop taking aspirin, ibuprofen, or other nonsteroidal anti-inflammatory drugs (NSAIDs). HOME CARE INSTRUCTIONS  Only take over-the-counter or prescription medicines as directed by your caregiver.  If you were given antibiotic medicines, take them as directed. Finish them even if you start to feel better.  Drink enough fluids to keep your urine clear or pale yellow.  Avoid foods and drinks that make your symptoms worse, such as:  Caffeine or alcoholic drinks.  Chocolate.  Peppermint or mint flavorings.  Garlic and onions.  Spicy foods.  Citrus fruits, such as oranges, lemons, or limes.  Tomato-based foods such as sauce, chili, salsa, and pizza.  Fried and fatty foods.  Eat small, frequent meals instead of large meals. SEEK IMMEDIATE MEDICAL CARE IF:   You have black or dark red stools.  You vomit blood or material that looks like coffee grounds.  You are unable to keep fluids down.  Your abdominal pain gets worse.  You have a fever.  You do not feel better after 1 week.  You have any other questions or concerns. MAKE SURE YOU:  Understand these instructions.  Will watch your condition.  Will get help right away if you are not doing well or get worse. Document Released: 03/02/2001 Document Revised: 09/07/2011 Document Reviewed: 04/21/2011 [facility] Patient Information 2015 [place], [place]. This information is not intended to replace advice given to you by your health care provider. Make sure you discuss any questions you have with your health care provider.

## 2014-08-13 ENCOUNTER — Other Ambulatory Visit: Payer: Self-pay

## 2014-08-13 DIAGNOSIS — Z1231 Encounter for screening mammogram for malignant neoplasm of breast: Secondary | ICD-10-CM

## 2014-08-21 ENCOUNTER — Ambulatory Visit
Admission: RE | Admit: 2014-08-21 | Discharge: 2014-08-21 | Disposition: A | Discharge status: Home / Self Care | Payer: 59 | Source: Ambulatory Visit

## 2014-08-21 DIAGNOSIS — Z1231 Encounter for screening mammogram for malignant neoplasm of breast: Secondary | ICD-10-CM

## 2014-10-17 ENCOUNTER — Ambulatory Visit (INDEPENDENT_AMBULATORY_CARE_PROVIDER_SITE_OTHER): Payer: 59 | Admitting: Physician Assistant

## 2014-10-17 ENCOUNTER — Other Ambulatory Visit: Payer: Self-pay

## 2014-10-17 ENCOUNTER — Encounter: Payer: Self-pay | Admitting: Physician Assistant

## 2014-10-17 ENCOUNTER — Other Ambulatory Visit (INDEPENDENT_AMBULATORY_CARE_PROVIDER_SITE_OTHER): Payer: 59

## 2014-10-17 VITALS — BP 142/72 | HR 72 | Ht 65.0 in | Wt 244.2 lb

## 2014-10-17 DIAGNOSIS — R6881 Early satiety: Secondary | ICD-10-CM

## 2014-10-17 DIAGNOSIS — R110 Nausea: Secondary | ICD-10-CM

## 2014-10-17 DIAGNOSIS — E1310 Other specified diabetes mellitus with ketoacidosis without coma: Secondary | ICD-10-CM

## 2014-10-17 DIAGNOSIS — E1110 Type 2 diabetes mellitus with ketoacidosis without coma: Secondary | ICD-10-CM

## 2014-10-17 LAB — COMPREHENSIVE METABOLIC PANEL
ALT: 19 U/L (ref 0–35)
AST: 19 U/L (ref 0–37)
Albumin: 4.3 g/dL (ref 3.5–5.2)
Alkaline Phosphatase: 85 U/L (ref 39–117)
BUN: 10 mg/dL (ref 6–23)
CO2: 30 mEq/L (ref 19–32)
Calcium: 9.7 mg/dL (ref 8.4–10.5)
Chloride: 102 mEq/L (ref 96–112)
Creatinine, Ser: 0.8 mg/dL (ref 0.40–1.20)
GFR: 93.79 mL/min (ref >60.00)
Glucose, Bld: 78 mg/dL (ref 70–99)
Potassium: 3.9 mEq/L (ref 3.5–5.1)
Sodium: 139 mEq/L (ref 135–145)
Total Bilirubin: 0.4 mg/dL (ref 0.2–1.2)
Total Protein: 8 g/dL (ref 6.0–8.3)

## 2014-10-17 LAB — CBC WITH DIFFERENTIAL/PLATELET
Basophils Absolute: 0 10*3/uL (ref 0.0–0.1)
Basophils Relative: 0.5 % (ref 0.0–3.0)
Eosinophils Absolute: 0.1 10*3/uL (ref 0.0–0.7)
Eosinophils Relative: 2.4 % (ref 0.0–5.0)
HCT: 38.8 % (ref 36.0–46.0)
Hemoglobin: 12.8 g/dL (ref 12.0–15.0)
Lymphocytes Relative: 35.7 % (ref 12.0–46.0)
Lymphs Abs: 2.1 10*3/uL (ref 0.7–4.0)
MCHC: 33 g/dL (ref 30.0–36.0)
MCV: 86.2 fl (ref 78.0–100.0)
Monocytes Absolute: 0.7 10*3/uL (ref 0.1–1.0)
Monocytes Relative: 12.6 % — ABNORMAL HIGH (ref 3.0–12.0)
Neutro Abs: 2.8 10*3/uL (ref 1.4–7.7)
Neutrophils Relative %: 48.8 % (ref 43.0–77.0)
Platelets: 313 10*3/uL (ref 150.0–400.0)
RBC: 4.49 Mil/uL (ref 3.87–5.11)
RDW: 15.9 % — ABNORMAL HIGH (ref 11.5–15.5)
WBC: 5.8 10*3/uL (ref 4.0–10.5)

## 2014-10-17 LAB — HEMOGLOBIN A1C: Hgb A1c MFr Bld: 8.5 % — ABNORMAL HIGH (ref 4.6–6.5)

## 2014-10-17 LAB — LIPASE: Lipase: 29 U/L (ref 11.0–59.0)

## 2014-10-17 MED ORDER — ONDANSETRON HCL 4 MG PO TABS: ORAL_TABLET | ORAL | Status: DC

## 2014-10-17 NOTE — Patient Instructions (Signed)
Please go to the basement level to have your labs drawn.  We sent a prescription to [address].  Zofran 4 mg for nausea.  You have been scheduled for a gastric emptying scan at [facility] on 10-29-2014  at 9:00 am Please arrive at 8:45 am to your appointment for registration. Please make certain not to have anything to eat or drink after midnight the night before your test. Hold all stomach medications (ex: Zofran, phenergan, Reglan) 48 hours prior to your test. If you need to reschedule your appointment, please contact radiology scheduling at [phone]. _____________________________________________________________________ A gastric-emptying study measures how long it takes for food to move through your stomach. There are several ways to measure stomach emptying. In the most common test, you eat food that contains a small amount of radioactive material. A scanner that detects the movement of the radioactive material is placed over your abdomen to monitor the rate at which food leaves your stomach. This test normally takes about 2 hours to complete. _____________________________________________________________________ We have given you a sample of a gastroparesis diet.

## 2014-10-17 NOTE — Progress Notes (Addendum)
Patient ID: Leslie Davis, female   DOB: May 20, 1955, 59 y.o.   MRN: [id number]   Subjective:    Patient ID: Leslie Davis, female    DOB: Nov 05, 1955, 59 y.o.   MRN: [id number]  HPI [name] is a pleasant 59 year old African-American female, former patient of Dr. Verl Blalock who was last seen in our office in 2014. She had undergone workup at that time for complaints of upper abdominal pain and nausea and was felt to have likely gastroparesis though gastric emptying scan was normal. She also had EGD done and showed mild gastritis. It was commented that her CBG was over 300 on the day of her endoscopy. CLOtest and was negative. She also had colonoscopy in 2013. She has history of insulin-dependent diabetes mellitus, on insulin over the past 12 years and She is status post cholecystectomy, has hypertension and obesity hyperlipidemia fatty liver disease. She said she had been doing fairly well recently until month or so ago when she developed nausea which she says is now present 24 hours per day. She has a full not fight-like feeling in her upper abdomen and says her appetite is been decreased. She's lost a few pounds over the past month or so. She says when she eats she feels more uncomfortable and nauseated and definitely feels that she fills up quickly. In. Bowel movements have been normal. No fever or chills. She's not been placed on any new meds or had any dosage adjustments. She's not on any regular aspirin or NSAIDs. He denies heartburn or indigestion. She has previously used Zofran and says that was helpful. As her blood sugars have been under pretty good control generally under 200 recently. She says sometimes with these long episodes of nausea she will feel weak and dizzy.  Review of Systems Pertinent positive and negative review of systems were noted in the above HPI section.  All other review of systems was otherwise negative.  Outpatient Encounter Prescriptions as of 10/17/2014    Medication Sig  . BENICAR HCT 20-12.5 MG per tablet Take 1 tablet by mouth daily.   . insulin detemir (LEVEMIR) 100 UNIT/ML injection Inject 32 Units into the skin at bedtime.   . pioglitazone-metformin (ACTOPLUS MET) 15-850 MG per tablet Take 1 tablet by mouth 2 (two) times daily with a meal.    . rosuvastatin (CRESTOR) 20 MG tablet Take 20 mg by mouth daily.   Marland Kitchen LEVEMIR FLEXTOUCH 100 UNIT/ML Pen   . [DISCONTINUED] olmesartan-hydrochlorothiazide (BENICAR HCT) 40-25 MG per tablet Take 1 tablet by mouth daily.   . [DISCONTINUED] ondansetron (ZOFRAN ODT) 4 MG disintegrating tablet Take 1 tablet (4 mg total) by mouth every 8 (eight) hours as needed for nausea. (Patient not taking: Reported on 02/15/2014)  . [DISCONTINUED] ondansetron (ZOFRAN) 4 MG tablet Take 1 tablet (4 mg total) by mouth 2 (two) times daily.  . [DISCONTINUED] oxyCODONE-acetaminophen (PERCOCET) 5-325 MG per tablet Take 1-2 tablets by mouth every 6 (six) hours as needed.  . [DISCONTINUED] pantoprazole (PROTONIX) 20 MG tablet Take 1 tablet (20 mg total) by mouth daily.   No facility-administered encounter medications on file as of 10/17/2014.   No Known Allergies Patient Active Problem List   Diagnosis Date Noted  . Abdominal pain, chronic, epigastric 12/11/2012  . Early satiety 12/11/2012  . Hiatal hernia   . Hx of cholecystectomy 02/05/2011  . Flu-like symptoms 02/05/2011  . CONSTIPATION 08/27/2008  . FATTY LIVER DISEASE 05/07/2008  . NAUSEA 04/23/2008  . EPIGASTRIC PAIN 04/23/2008  .  RENAL CALCULUS 04/22/2008  . DIABETES MELLITUS II, UNCOMPLICATED 10/93/2355  . HYPERLIPIDEMIA 05/19/2006  . HYPERTENSION, BENIGN SYSTEMIC 05/19/2006  . GASTROESOPHAGEAL REFLUX, NO ESOPHAGITIS 05/19/2006  . AMENORRHEA 05/19/2006   History   Social History  . Marital Status: Married    Spouse Name: N/A  . Number of Children: 2  . Years of Education: N/A   Occupational History  . housekeeping    Social History Main Topics  .  Smoking status: Former Smoker    Quit date: 09/10/1974  . Smokeless tobacco: Never Used  . Alcohol Use: No  . Drug Use: No  . Sexual Activity: Not on file   Other Topics Concern  . Not on file   Social History Narrative    Leslie Davis's family history includes Diabetes in her maternal grandmother and mother; Stomach cancer in her maternal aunt. There is no history of Colon cancer.      Objective:    Filed Vitals:   10/17/14 1008  BP: 142/72  Pulse: 72    Physical Exam  well-developed older African-American female in no acute distress, pleasant blood pressure 142/72 pulse 72 height 5 foot 5 weight 244, BMI 40.658 HEENT; nontraumatic normocephalic EOMI PERRLA sclera anicteric, Supple; no JVD, Cardiovascular regular rate and rhythm with S1-S2 no murmur or gallop, Pulmonary; clear bilaterally, Abdomen ;large soft mildly tender in the epigastrium no succussion splash no guarding or rebound no palpable mass or hepatosplenomegaly, bowel sounds are present, Rectal; exam not done, Extremities; no clubbing cyanosis or edema skin warm and dry, Neuropsych; mood and affect appropriate       Assessment & Plan:   #1 59 yo female with IDDM with one month hx of constant nausea, and early satiety .  Suspect gastroparesis, non ulcer dyspepsia #2 obesity  #3 HTN  #4 s/p GB #5 hx fatty liver   #6 colon screening- colonoscopy 2013 -normal  Plan: Schedule for 4 hours GE scan  Gastroparesis diet step 2/3  Add Zofran 4 mg q 6-8 hours prn nausea HGb A1C, cbc,lipase cmet  Pt will be established with  Dr.Pyrtle   Teriann Livingood Genia Harold PA-C 10/17/2014   Cc: Leslie Chard, MD  Addendum: Reviewed and agree with initial management. Jerene Bears, MD

## 2014-10-29 ENCOUNTER — Ambulatory Visit (HOSPITAL_COMMUNITY)
Admission: RE | Admit: 2014-10-29 | Discharge: 2014-10-29 | Disposition: A | Discharge status: Home / Self Care | Payer: 59 | Source: Ambulatory Visit | Attending: Physician Assistant | Admitting: Physician Assistant

## 2014-10-29 DIAGNOSIS — R6881 Early satiety: Secondary | ICD-10-CM | POA: Diagnosis not present

## 2014-10-29 DIAGNOSIS — R112 Nausea with vomiting, unspecified: Secondary | ICD-10-CM | POA: Diagnosis not present

## 2014-10-29 DIAGNOSIS — K219 Gastro-esophageal reflux disease without esophagitis: Secondary | ICD-10-CM | POA: Insufficient documentation

## 2014-10-29 DIAGNOSIS — R110 Nausea: Secondary | ICD-10-CM

## 2014-10-29 DIAGNOSIS — E119 Type 2 diabetes mellitus without complications: Secondary | ICD-10-CM | POA: Diagnosis not present

## 2014-10-29 MED ORDER — TECHNETIUM TC 99M SULFUR COLLOID
2.0000 | Freq: Once | INTRAVENOUS | Status: DC | PRN
  Administered 2014-10-29: 2 via INTRAVENOUS
  Filled 2014-10-29: qty 2

## 2015-03-13 ENCOUNTER — Emergency Department (HOSPITAL_COMMUNITY)
Admission: EM | Admit: 2015-03-13 | Discharge: 2015-03-13 | Disposition: A | Discharge status: Home / Self Care | Payer: Managed Care, Other (non HMO) | Attending: Emergency Medicine | Admitting: Emergency Medicine

## 2015-03-13 ENCOUNTER — Encounter (HOSPITAL_COMMUNITY): Payer: Self-pay

## 2015-03-13 ENCOUNTER — Emergency Department (HOSPITAL_COMMUNITY): Payer: Managed Care, Other (non HMO)

## 2015-03-13 DIAGNOSIS — R079 Chest pain, unspecified: Secondary | ICD-10-CM | POA: Insufficient documentation

## 2015-03-13 DIAGNOSIS — I10 Essential (primary) hypertension: Secondary | ICD-10-CM | POA: Insufficient documentation

## 2015-03-13 DIAGNOSIS — Z87891 Personal history of nicotine dependence: Secondary | ICD-10-CM | POA: Diagnosis not present

## 2015-03-13 DIAGNOSIS — Z79899 Other long term (current) drug therapy: Secondary | ICD-10-CM | POA: Diagnosis not present

## 2015-03-13 DIAGNOSIS — Z87442 Personal history of urinary calculi: Secondary | ICD-10-CM | POA: Insufficient documentation

## 2015-03-13 DIAGNOSIS — E1165 Type 2 diabetes mellitus with hyperglycemia: Secondary | ICD-10-CM | POA: Insufficient documentation

## 2015-03-13 DIAGNOSIS — R739 Hyperglycemia, unspecified: Secondary | ICD-10-CM

## 2015-03-13 DIAGNOSIS — R030 Elevated blood-pressure reading, without diagnosis of hypertension: Secondary | ICD-10-CM

## 2015-03-13 DIAGNOSIS — Z8719 Personal history of other diseases of the digestive system: Secondary | ICD-10-CM | POA: Diagnosis not present

## 2015-03-13 DIAGNOSIS — Z794 Long term (current) use of insulin: Secondary | ICD-10-CM | POA: Insufficient documentation

## 2015-03-13 DIAGNOSIS — E785 Hyperlipidemia, unspecified: Secondary | ICD-10-CM | POA: Insufficient documentation

## 2015-03-13 DIAGNOSIS — Z7984 Long term (current) use of oral hypoglycemic drugs: Secondary | ICD-10-CM | POA: Insufficient documentation

## 2015-03-13 DIAGNOSIS — IMO0001 Reserved for inherently not codable concepts without codable children: Secondary | ICD-10-CM

## 2015-03-13 LAB — BASIC METABOLIC PANEL
Anion gap: 10 (ref 5–15)
BUN: 14 mg/dL (ref 6–20)
CO2: 26 mmol/L (ref 22–32)
Calcium: 9.4 mg/dL (ref 8.9–10.3)
Chloride: 103 mmol/L (ref 101–111)
Creatinine, Ser: 1.05 mg/dL — ABNORMAL HIGH (ref 0.44–1.00)
GFR calc Af Amer: >60 mL/min (ref >60)
GFR calc non Af Amer: 56 mL/min — ABNORMAL LOW (ref >60)
Glucose, Bld: 221 mg/dL — ABNORMAL HIGH (ref 65–99)
Potassium: 4.1 mmol/L (ref 3.5–5.1)
Sodium: 139 mmol/L (ref 135–145)

## 2015-03-13 LAB — CBC
HCT: 35.8 % — ABNORMAL LOW (ref 36.0–46.0)
Hemoglobin: 11.6 g/dL — ABNORMAL LOW (ref 12.0–15.0)
MCH: 28.2 pg (ref 26.0–34.0)
MCHC: 32.4 g/dL (ref 30.0–36.0)
MCV: 87.1 fL (ref 78.0–100.0)
Platelets: 309 10*3/uL (ref 150–400)
RBC: 4.11 MIL/uL (ref 3.87–5.11)
RDW: 15.1 % (ref 11.5–15.5)
WBC: 6.2 10*3/uL (ref 4.0–10.5)

## 2015-03-13 LAB — I-STAT TROPONIN, ED
Troponin i, poc: 0.01 ng/mL (ref 0.00–0.08)
Troponin i, poc: 0.01 ng/mL (ref 0.00–0.08)

## 2015-03-13 MED ORDER — SODIUM CHLORIDE 0.9 % IV BOLUS (SEPSIS)
1000.0000 mL | Freq: Once | INTRAVENOUS | Status: AC
  Administered 2015-03-13: 1000 mL via INTRAVENOUS

## 2015-03-13 MED ORDER — LABETALOL HCL 5 MG/ML IV SOLN
20.0000 mg | Freq: Once | INTRAVENOUS | Status: AC
  Administered 2015-03-13: 20 mg via INTRAVENOUS
  Filled 2015-03-13: qty 4

## 2015-03-13 MED ORDER — LABETALOL HCL 5 MG/ML IV SOLN
10.0000 mg | Freq: Once | INTRAVENOUS | Status: DC
  Filled 2015-03-13: qty 4

## 2015-03-13 NOTE — ED Notes (Signed)
Pt arrived via [facility] c/o sharp left sided chest pain radiating to her neck and jaw.  [facility] gave 324mg  ASA, 1 nitro SL pain 0/10.  CBG 233.

## 2015-03-13 NOTE — ED Notes (Signed)
Pt now c/o headache.  MD aware.

## 2015-03-13 NOTE — ED Provider Notes (Signed)
CSN: [id number]     Arrival date & time 03/13/15  1515 History   None    Chief Complaint  Patient presents with  . Chest Pain     (Consider location/radiation/quality/duration/timing/severity/associated sxs/prior Treatment) Patient is a 59 y.o. female presenting with chest pain. The history is provided by the patient.  Chest Pain Pain location:  L chest Pain quality: sharp   Pain radiates to:  Neck Pain radiates to the back: no   Pain severity:  Moderate Onset quality:  Gradual Duration:  2 hours Timing:  Constant Progression:  Resolved Chronicity:  New Context: at rest   Relieved by:  Nitroglycerin and aspirin Worsened by:  Nothing tried Ineffective treatments:  None tried Associated symptoms: no abdominal pain, no AICD problem, no altered mental status, no anorexia, no anxiety, no back pain, no claudication, no cough, no diaphoresis, no dizziness, no dysphagia, no fatigue, no fever, no headache, no heartburn, no lower extremity edema, no nausea, no near-syncope, no numbness, no palpitations, no PND, no shortness of breath, no syncope, not vomiting and no weakness   Risk factors: diabetes mellitus, high cholesterol, hypertension and obesity   Risk factors: no coronary artery disease, not female, no prior DVT/PE and no smoking     Past Medical History  Diagnosis Date  . Fatty liver disease, nonalcoholic   . Gastroparesis   . Renal calculus   . Gastritis   . HTN (hypertension)   . HLD (hyperlipidemia)   . GERD (gastroesophageal reflux disease)   . DM (diabetes mellitus) ([place])   . Hiatal hernia   . Hiatal hernia    Past Surgical History  Procedure Laterality Date  . Cholecystectomy    . Tubal ligation    . Colonoscopy     Family History  Problem Relation Age of Onset  . Diabetes Mother   . Diabetes Maternal Grandmother   . Stomach cancer Maternal Aunt     X 2 aunts  . Colon cancer Neg Hx    Social History  Substance Use Topics  . Smoking status: Former Smoker     Quit date: 09/10/1974  . Smokeless tobacco: Never Used  . Alcohol Use: No   OB History    No data available     Review of Systems  Constitutional: Negative for fever, diaphoresis and fatigue.  HENT: Negative for trouble swallowing.   Eyes: Negative for pain.  Respiratory: Positive for chest tightness. Negative for cough and shortness of breath.   Cardiovascular: Positive for chest pain. Negative for palpitations, claudication, syncope, PND and near-syncope.  Gastrointestinal: Negative for heartburn, nausea, vomiting, abdominal pain and anorexia.  Musculoskeletal: Negative for back pain.  Skin: Negative for rash.  Neurological: Negative for dizziness, weakness, numbness and headaches.  Psychiatric/Behavioral: Negative for confusion.      Allergies  Review of patient's allergies indicates no known allergies.  Home Medications   Prior to Admission medications   Medication Sig Start Date End Date Taking? Authorizing Provider  Insulin Degludec (TRESIBA FLEXTOUCH) 100 UNIT/ML SOPN Inject 42 Units into the skin every evening.   Yes Historical Provider, MD  losartan (COZAAR) 100 MG tablet Take 100 mg by mouth daily.   Yes Historical Provider, MD  pioglitazone-metformin (ACTOPLUS MET) 15-850 MG per tablet Take 1 tablet by mouth 2 (two) times daily with a meal.     Yes Historical Provider, MD  rosuvastatin (CRESTOR) 20 MG tablet Take 20 mg by mouth daily.    Yes Historical Provider, MD   BP  182/68 mmHg  Pulse 71  Temp(Src) 97.9 F (36.6 C) (Oral)  Resp 15  SpO2 99% Physical Exam  Constitutional: She appears well-developed and well-nourished. No distress.  HENT:  Head: Head is without abrasion, without contusion and without laceration.  Eyes: Conjunctivae and EOM are normal. Pupils are equal, round, and reactive to light.  Cardiovascular: Normal rate, regular rhythm and normal heart sounds.  Exam reveals no decreased pulses.   No murmur heard. Pulmonary/Chest: Effort normal  and breath sounds normal. She has no decreased breath sounds. She has no wheezes. She has no rhonchi. She has no rales. She exhibits no tenderness, no laceration and no edema.  Abdominal: Normal appearance. She exhibits no distension and no mass. There is no tenderness. There is no rigidity, no rebound, no guarding, no CVA tenderness, no tenderness at McBurney's point and negative Murphy's sign.  Neurological: She is alert. She has normal strength. No cranial nerve deficit or sensory deficit. GCS eye subscore is 4. GCS verbal subscore is 5. GCS motor subscore is 6.  Skin: Skin is warm and dry. No rash noted.  Psychiatric: She has a normal mood and affect. Her speech is normal.    ED Course  Procedures (including critical care time) Labs Review Labs Reviewed  BASIC METABOLIC PANEL - Abnormal; Notable for the following:    Glucose, Bld 221 (*)    Creatinine, Ser 1.05 (*)    GFR calc non Af Amer 56 (*)    All other components within normal limits  CBC - Abnormal; Notable for the following:    Hemoglobin 11.6 (*)    HCT 35.8 (*)    All other components within normal limits  I-STAT TROPOININ, ED  I-STAT TROPOININ, ED    Imaging Review Dg Chest 2 View  03/13/2015  CLINICAL DATA:  Mid sternal sharp chest pain today radiating to LEFT side of neck, diabetes mellitus, hypertension, hiatal hernia, GERD, former smoker EXAM: CHEST  2 VIEW COMPARISON:  02/15/2014 FINDINGS: Enlargement of cardiac silhouette. Atherosclerotic calcification aortic arch. Mediastinal contours and pulmonary vascularity normal. Bronchitic changes with RIGHT basilar atelectasis. Remaining lungs clear. No pleural effusion or pneumothorax. Bones demineralized. IMPRESSION: Enlargement of cardiac silhouette. Bronchitic changes with RIGHT basilar atelectasis. Electronically Signed   By: Lavonia Dana M.D.   On: 03/13/2015 16:23   I have personally reviewed and evaluated these images and lab results as part of my medical  decision-making.   EKG Interpretation   Date/Time:  Thursday March 13 2015 15:22:46 EST Ventricular Rate:  67 PR Interval:  130 QRS Duration: 82 QT Interval:  411 QTC Calculation: 434 R Axis:   31 Text Interpretation:  Sinus rhythm Anteroseptal infarct, old Nonspecific T  abnormalities, lateral leads Left anterior fasicular block since last  tracing no significant change Confirmed by MILLER  MD, BRIAN ([id number]) on  03/13/2015 3:26:15 PM      MDM   Final diagnoses:  Left sided chest pain  Elevated blood pressure  Elevated blood sugar    59 year old African-American female with past medical history of hypertension, hyperlipidemia, diabetes presents in setting of chest pain. Patient reports she was sitting at home when she had onset of left-sided chest pain which was sharp in nature. Pain radiated to left neck and was reproduced with adduction of the left arm. EMS was called and patient received nitroglycerin and aspirin with improvement in pain. Patient denies symptoms previously and does report some mild worsening of pain with deep inspiration. Patient denies headaches, vision change,  tobacco abuse, shortness of breath, abdominal pain, nausea, vomiting, diaphoresis, fevers, rashes, recent sick contacts or recent travel, history of blood clots. Patient does report she had a brother who died at 26 from a heart attack.  On arrival patient was found to be hypertensive with systolics in the 194F. She reported complete resolution of symptoms at that time. Pain was not reproduced with palpation and patient was resting comfortably on examination. Oxygen saturation is within normal limits on room air. EKG was obtained which revealed inverted T waves in lateral leads which is unchanged from previous EKGs. No signs of acute ST segment elevation or depression. No signs of arrhythmia or other acute abnormality at this time. In setting of patient's presentation we'll obtain troponin 2, CBC, BMP,  chest x-ray and will evaluate patient after results. Pt has a score of 3 on HEAR score making her low risk.  Troponin 2 without significant elevation. Chest x-ray without acute cardiopulmonary abnormality. Patient had slight elevation in creatinine and glucose and was given fluids. Patient had a little bit of blood pressure which improved with labetalol. The patient's pain continued to be resolved. In setting this finding will discharge patient home at this time with plan to follow up PCP in one to 2 days for reevaluation. Patient will need further management of high blood pressure and chest pain. Patient given strict return precautions and stable at time of discharge. Patient and family in agreement with plan.  Attending has seen and evaluated patient and Dr. Venora Maples is in agreement with plan.      Esaw Grandchild, MD 03/13/15 [place], MD 03/14/15 [phone]

## 2015-03-13 NOTE — Discharge Instructions (Signed)
Chest Wall Pain Chest wall pain is pain in or around the bones and muscles of your chest. Sometimes, an injury causes this pain. Sometimes, the cause may not be known. This pain may take several weeks or longer to get better. HOME CARE Pay attention to any changes in your symptoms. Take these actions to help with your pain:  Rest as told by your doctor.  Avoid activities that cause pain. Try not to use your chest, belly (abdominal), or side muscles to lift heavy things.  If directed, apply ice to the painful area:  Put ice in a plastic bag.  Place a towel between your skin and the bag.  Leave the ice on for 20 minutes, 2-3 times per day.  Take over-the-counter and prescription medicines only as told by your doctor.  Do not use tobacco products, including cigarettes, chewing tobacco, and e-cigarettes. If you need help quitting, ask your doctor.  Keep all follow-up visits as told by your doctor. This is important. GET HELP IF:  You have a fever.  Your chest pain gets worse.  You have new symptoms. GET HELP RIGHT AWAY IF:  You feel sick to your stomach (nauseous) or you throw up (vomit).  You feel sweaty or light-headed.  You have a cough with phlegm (sputum) or you cough up blood.  You are short of breath.   This information is not intended to replace advice given to you by your health care provider. Make sure you discuss any questions you have with your health care provider.   Document Released: 08/25/2007 Document Revised: 11/27/2014 Document Reviewed: 06/03/2014 Elsevier Interactive Patient Education 2016 [employer]  Nonspecific Chest Pain  Chest pain can be caused by many different conditions. There is always a chance that your pain could be related to something serious, such as a heart attack or a blood clot in your lungs. Chest pain can also be caused by conditions that are not life-threatening. If you have chest pain, it is very important to follow up with your  health care provider. CAUSES  Chest pain can be caused by:  Heartburn.  Pneumonia or bronchitis.  Anxiety or stress.  Inflammation around your heart (pericarditis) or lung (pleuritis or pleurisy).  A blood clot in your lung.  A collapsed lung (pneumothorax). It can develop suddenly on its own (spontaneous pneumothorax) or from trauma to the chest.  Shingles infection (varicella-zoster virus).  Heart attack.  Damage to the bones, muscles, and cartilage that make up your chest wall. This can include:  Bruised bones due to injury.  Strained muscles or cartilage due to frequent or repeated coughing or overwork.  Fracture to one or more ribs.  Sore cartilage due to inflammation (costochondritis). RISK FACTORS  Risk factors for chest pain may include:  Activities that increase your risk for trauma or injury to your chest.  Respiratory infections or conditions that cause frequent coughing.  Medical conditions or overeating that can cause heartburn.  Heart disease or family history of heart disease.  Conditions or health behaviors that increase your risk of developing a blood clot.  Having had chicken pox (varicella zoster). SIGNS AND SYMPTOMS Chest pain can feel like:  Burning or tingling on the surface of your chest or deep in your chest.  Crushing, pressure, aching, or squeezing pain.  Dull or sharp pain that is worse when you move, cough, or take a deep breath.  Pain that is also felt in your back, neck, shoulder, or arm, or pain that  spreads to any of these areas. Your chest pain may come and go, or it may stay constant. DIAGNOSIS Lab tests or other studies may be needed to find the cause of your pain. Your health care provider may have you take a test called an ambulatory ECG (electrocardiogram). An ECG records your heartbeat patterns at the time the test is performed. You may also have other tests, such as:  Transthoracic echocardiogram (TTE). During  echocardiography, sound waves are used to create a picture of all of the heart structures and to look at how blood flows through your heart.  Transesophageal echocardiogram (TEE).This is a more advanced imaging test that obtains images from inside your body. It allows your health care provider to see your heart in finer detail.  Cardiac monitoring. This allows your health care provider to monitor your heart rate and rhythm in real time.  Holter monitor. This is a portable device that records your heartbeat and can help to diagnose abnormal heartbeats. It allows your health care provider to track your heart activity for several days, if needed.  Stress tests. These can be done through exercise or by taking medicine that makes your heart beat more quickly.  Blood tests.  Imaging tests. TREATMENT  Your treatment depends on what is causing your chest pain. Treatment may include:  Medicines. These may include:  Acid blockers for heartburn.  Anti-inflammatory medicine.  Pain medicine for inflammatory conditions.  Antibiotic medicine, if an infection is present.  Medicines to dissolve blood clots.  Medicines to treat coronary artery disease.  Supportive care for conditions that do not require medicines. This may include:  Resting.  Applying heat or cold packs to injured areas.  Limiting activities until pain decreases. HOME CARE INSTRUCTIONS  If you were prescribed an antibiotic medicine, finish it all even if you start to feel better.  Avoid any activities that bring on chest pain.  Do not use any tobacco products, including cigarettes, chewing tobacco, or electronic cigarettes. If you need help quitting, ask your health care provider.  Do not drink alcohol.  Take medicines only as directed by your health care provider.  Keep all follow-up visits as directed by your health care provider. This is important. This includes any further testing if your chest pain does not go  away.  If heartburn is the cause for your chest pain, you may be told to keep your head raised (elevated) while sleeping. This reduces the chance that acid will go from your stomach into your esophagus.  Make lifestyle changes as directed by your health care provider. These may include:  Getting regular exercise. Ask your health care provider to suggest some activities that are safe for you.  Eating a heart-healthy diet. A registered dietitian can help you to learn healthy eating options.  Maintaining a healthy weight.  Managing diabetes, if necessary.  Reducing stress. SEEK MEDICAL CARE IF:  Your chest pain does not go away after treatment.  You have a rash with blisters on your chest.  You have a fever. SEEK IMMEDIATE MEDICAL CARE IF:   Your chest pain is worse.  You have an increasing cough, or you cough up blood.  You have severe abdominal pain.  You have severe weakness.  You faint.  You have chills.  You have sudden, unexplained chest discomfort.  You have sudden, unexplained discomfort in your arms, back, neck, or jaw.  You have shortness of breath at any time.  You suddenly start to sweat, or your skin  gets clammy.  You feel nauseous or you vomit.  You suddenly feel light-headed or dizzy.  Your heart begins to beat quickly, or it feels like it is skipping beats. These symptoms may represent a serious problem that is an emergency. Do not wait to see if the symptoms will go away. Get medical help right away. Call your local emergency services (911 in the [place] Do not drive yourself to the hospital.   This information is not intended to replace advice given to you by your health care provider. Make sure you discuss any questions you have with your health care provider.   Document Released: 12/16/2004 Document Revised: 03/29/2014 Document Reviewed: 10/12/2013 Elsevier Interactive Patient Education [employer].

## 2015-08-07 ENCOUNTER — Emergency Department (HOSPITAL_COMMUNITY)
Admission: EM | Admit: 2015-08-07 | Discharge: 2015-08-07 | Disposition: A | Discharge status: Home / Self Care | Payer: Managed Care, Other (non HMO) | Attending: Emergency Medicine | Admitting: Emergency Medicine

## 2015-08-07 ENCOUNTER — Emergency Department (HOSPITAL_COMMUNITY): Payer: Managed Care, Other (non HMO)

## 2015-08-07 ENCOUNTER — Encounter (HOSPITAL_COMMUNITY): Payer: Self-pay | Admitting: Emergency Medicine

## 2015-08-07 DIAGNOSIS — Z7984 Long term (current) use of oral hypoglycemic drugs: Secondary | ICD-10-CM | POA: Diagnosis not present

## 2015-08-07 DIAGNOSIS — Z87891 Personal history of nicotine dependence: Secondary | ICD-10-CM | POA: Insufficient documentation

## 2015-08-07 DIAGNOSIS — I10 Essential (primary) hypertension: Secondary | ICD-10-CM | POA: Insufficient documentation

## 2015-08-07 DIAGNOSIS — Z79899 Other long term (current) drug therapy: Secondary | ICD-10-CM | POA: Diagnosis not present

## 2015-08-07 DIAGNOSIS — Z7982 Long term (current) use of aspirin: Secondary | ICD-10-CM | POA: Diagnosis not present

## 2015-08-07 DIAGNOSIS — E1143 Type 2 diabetes mellitus with diabetic autonomic (poly)neuropathy: Secondary | ICD-10-CM | POA: Insufficient documentation

## 2015-08-07 DIAGNOSIS — Z794 Long term (current) use of insulin: Secondary | ICD-10-CM | POA: Insufficient documentation

## 2015-08-07 DIAGNOSIS — D649 Anemia, unspecified: Secondary | ICD-10-CM | POA: Diagnosis not present

## 2015-08-07 DIAGNOSIS — R609 Edema, unspecified: Secondary | ICD-10-CM

## 2015-08-07 DIAGNOSIS — R2243 Localized swelling, mass and lump, lower limb, bilateral: Secondary | ICD-10-CM | POA: Diagnosis not present

## 2015-08-07 DIAGNOSIS — E785 Hyperlipidemia, unspecified: Secondary | ICD-10-CM | POA: Insufficient documentation

## 2015-08-07 LAB — URINALYSIS, ROUTINE W REFLEX MICROSCOPIC
Bilirubin Urine: NEGATIVE
Glucose, UA: NEGATIVE mg/dL
Hgb urine dipstick: NEGATIVE
Ketones, ur: NEGATIVE mg/dL
Nitrite: NEGATIVE
Protein, ur: 100 mg/dL — AB
Specific Gravity, Urine: 1.015 (ref 1.005–1.030)
pH: 6 (ref 5.0–8.0)

## 2015-08-07 LAB — CBC
HCT: 32.7 % — ABNORMAL LOW (ref 36.0–46.0)
Hemoglobin: 10.8 g/dL — ABNORMAL LOW (ref 12.0–15.0)
MCH: 28 pg (ref 26.0–34.0)
MCHC: 33 g/dL (ref 30.0–36.0)
MCV: 84.7 fL (ref 78.0–100.0)
Platelets: 300 10*3/uL (ref 150–400)
RBC: 3.86 MIL/uL — ABNORMAL LOW (ref 3.87–5.11)
RDW: 15.5 % (ref 11.5–15.5)
WBC: 6.2 10*3/uL (ref 4.0–10.5)

## 2015-08-07 LAB — BASIC METABOLIC PANEL
Anion gap: 8 (ref 5–15)
BUN: 14 mg/dL (ref 6–20)
CO2: 25 mmol/L (ref 22–32)
Calcium: 9.2 mg/dL (ref 8.9–10.3)
Chloride: 108 mmol/L (ref 101–111)
Creatinine, Ser: 0.82 mg/dL (ref 0.44–1.00)
GFR calc Af Amer: >60 mL/min (ref >60)
GFR calc non Af Amer: >60 mL/min (ref >60)
Glucose, Bld: 61 mg/dL — ABNORMAL LOW (ref 65–99)
Potassium: 3.9 mmol/L (ref 3.5–5.1)
Sodium: 141 mmol/L (ref 135–145)

## 2015-08-07 LAB — URINE MICROSCOPIC-ADD ON

## 2015-08-07 LAB — CBG MONITORING, ED: Glucose-Capillary: 65 mg/dL (ref 65–99)

## 2015-08-07 MED ORDER — HYDROCHLOROTHIAZIDE 12.5 MG PO TABS: 12.5000 mg | ORAL_TABLET | Freq: Every day | ORAL | Status: DC

## 2015-08-07 MED ORDER — HYDROCHLOROTHIAZIDE 12.5 MG PO TABS: 12.5000 mg | ORAL_TABLET | ORAL | Status: DC

## 2015-08-07 NOTE — ED Provider Notes (Signed)
CSN: [id number]     Arrival date & time 08/07/15  1447 History   First MD Initiated Contact with Patient 08/07/15 2049     Chief Complaint  Patient presents with  . Shortness of Breath  . Hypertension     (Consider location/radiation/quality/duration/timing/severity/associated sxs/prior Treatment) HPI Comments: Patient states that she is a known hypertensive.  She saw her primary care physician approximately 3 weeks because she was having some shortness of breath with exertion and swelling to her lower extremities.  Her primary care physician changed her antihypertensive from Edarbi to the same medication with chlorothiazide.  Patient states she took this for approximate 2 weeks but felt very drained and took herself off the medication proximally week ago, but did restart her previous medication at the same dosage.  She did not consult her physician with this change.  She did note that while she was taking the chlorothiazide.  The swelling in her legs did go down and she was not feeling as short of breath.  Tonight she is denying any chest pain, headache, visual disturbances.  Patient is a 60 y.o. female presenting with shortness of breath and hypertension. The history is provided by the patient.  Shortness of Breath Severity:  Mild Onset quality:  Gradual Timing:  Intermittent Progression:  Unchanged Chronicity:  New Context: activity   Relieved by:  Nothing Worsened by:  Activity Ineffective treatments:  None tried Associated symptoms: no chest pain, no cough, no fever and no headaches   Risk factors: obesity   Hypertension Pertinent negatives include no chest pain, chills, coughing, fever or headaches.    Past Medical History  Diagnosis Date  . Fatty liver disease, nonalcoholic   . Gastroparesis   . Renal calculus   . Gastritis   . HTN (hypertension)   . HLD (hyperlipidemia)   . GERD (gastroesophageal reflux disease)   . DM (diabetes mellitus) ([place])   . Hiatal hernia   .  Hiatal hernia    Past Surgical History  Procedure Laterality Date  . Cholecystectomy    . Tubal ligation    . Colonoscopy     Family History  Problem Relation Age of Onset  . Diabetes Mother   . Diabetes Maternal Grandmother   . Stomach cancer Maternal Aunt     X 2 aunts  . Colon cancer Neg Hx    Social History  Substance Use Topics  . Smoking status: Former Smoker    Quit date: 09/10/1974  . Smokeless tobacco: Never Used  . Alcohol Use: No   OB History    No data available     Review of Systems  Constitutional: Negative for fever and chills.  Eyes: Negative for visual disturbance.  Respiratory: Positive for shortness of breath. Negative for cough.   Cardiovascular: Positive for leg swelling. Negative for chest pain.  Gastrointestinal: Negative for abdominal distention.  Genitourinary: Negative for dysuria and frequency.  Neurological: Negative for dizziness and headaches.  All other systems reviewed and are negative.     Allergies  Review of patient's allergies indicates no known allergies.  Home Medications   Prior to Admission medications   Medication Sig Start Date End Date Taking? Authorizing Provider  aspirin EC 81 MG tablet Take 81 mg by mouth daily.   Yes Historical Provider, MD  Azilsartan Medoxomil (EDARBI) 40 MG TABS Take 40 mg by mouth daily.   Yes Historical Provider, MD  Insulin Degludec (TRESIBA FLEXTOUCH) 100 UNIT/ML SOPN Inject 42 Units into the skin every  evening.   Yes Historical Provider, MD  pioglitazone-metformin (ACTOPLUS MET) 15-850 MG per tablet Take 1 tablet by mouth 2 (two) times daily with a meal.     Yes Historical Provider, MD  rosuvastatin (CRESTOR) 20 MG tablet Take 20 mg by mouth daily.    Yes Historical Provider, MD   BP 220/57 mmHg  Pulse 66  Temp(Src) 97.7 F (36.5 C) (Oral)  Resp 19  SpO2 99% Physical Exam  Constitutional: She is oriented to person, place, and time. She appears well-developed and well-nourished.  HENT:   Head: Normocephalic.  Eyes: Pupils are equal, round, and reactive to light.  Neck: Normal range of motion.  Cardiovascular: Normal rate and regular rhythm.   EKG unchanged  Pulmonary/Chest: Effort normal. No respiratory distress. She has no wheezes.  Musculoskeletal: She exhibits edema. She exhibits no tenderness.  2-3 + edema   Neurological: She is alert and oriented to person, place, and time.  Skin: Skin is warm.  Nursing note and vitals reviewed.   ED Course  Procedures (including critical care time) Labs Review Labs Reviewed  BASIC METABOLIC PANEL - Abnormal; Notable for the following:    Glucose, Bld 61 (*)    All other components within normal limits  CBC - Abnormal; Notable for the following:    RBC 3.86 (*)    Hemoglobin 10.8 (*)    HCT 32.7 (*)    All other components within normal limits  CBG MONITORING, ED    Imaging Review Dg Chest 2 View  08/07/2015  CLINICAL DATA:  Short of breath for 2 weeks EXAM: CHEST  2 VIEW COMPARISON:  03/13/2015 FINDINGS: Mild cardiomegaly. Normal vascularity. Clear lungs. No pneumothorax or pleural effusion. IMPRESSION: Cardiomegaly without decompensation. Electronically Signed   By: Marybelle Killings M.D.   On: 08/07/2015 15:55   I have personally reviewed and evaluated these images and lab results as part of my medical decision-making.   EKG Interpretation   Date/Time:  Thursday Aug 07 2015 15:27:41 EDT Ventricular Rate:  59 PR Interval:  136 QRS Duration: 86 QT Interval:  425 QTC Calculation: 421 R Axis:   59 Text Interpretation:  Sinus rhythm Borderline T abnormalities, lateral  leads no significant change since Dec 2016 Confirmed by Regenia Skeeter MD, SCOTT  [phone]) on 08/07/2015 9:06:58 PM      MDM  Patient's labs have been reviewed.  She has slightly anemic with a hemoglobin of 10.8.  EKG is unchanged.  Chest x-ray reveals cardiomegaly without vascular congestion.  She has hypertensive with a blood pressure of 220/57, in lieu of  any other signs and symptoms of secondary organ disruption.  I will add hydrochlorothiazide 12.5 mg every other day, when the patient where support stockings and follow-up with her primary care physician. Final diagnoses:  None         Junius Creamer, NP 08/07/15 2120  Junius Creamer, NP 08/07/15 2245  Sherwood Gambler, MD 08/08/15 1710

## 2015-08-07 NOTE — ED Notes (Signed)
Pt reports SOB and extremity swellling. Was given an rx for medication to reduce swelling and HTN by PCP. Pt reports medication made her too tired so she stopped taking it 2 weeks ago. Pt called PCP who told her to go to an UC. UC told her to come to the ED due to HTN and low O2. Pt's o2 100% on RA.

## 2015-08-07 NOTE — ED Notes (Signed)
NP at bedside.

## 2015-08-07 NOTE — Discharge Instructions (Signed)
Edema °Edema is an abnormal buildup of fluids in your body tissues. Edema is somewhat dependent on gravity to pull the fluid to the lowest place in your body. That makes the condition more common in the legs and thighs (lower extremities). Painless swelling of the feet and ankles is common and becomes more likely as you get older. It is also common in looser tissues, like around your eyes.  °When the affected area is squeezed, the fluid may move out of that spot and leave a dent for a few moments. This dent is called pitting.  °CAUSES  °There are many possible causes of edema. Eating too much salt and being on your feet or sitting for a long time can cause edema in your legs and ankles. Hot weather may make edema worse. Common medical causes of edema include: °· Heart failure. °· Liver disease. °· Kidney disease. °· Weak blood vessels in your legs. °· Cancer. °· An injury. °· Pregnancy. °· Some medications. °· Obesity.  °SYMPTOMS  °Edema is usually painless. Your skin may look swollen or shiny.  °DIAGNOSIS  °Your health care provider may be able to diagnose edema by asking about your medical history and doing a physical exam. You may need to have tests such as X-rays, an electrocardiogram, or blood tests to check for medical conditions that may cause edema.  °TREATMENT  °Edema treatment depends on the cause. If you have heart, liver, or kidney disease, you need the treatment appropriate for these conditions. General treatment may include: °· Elevation of the affected body part above the level of your heart. °· Compression of the affected body part. Pressure from elastic bandages or support stockings squeezes the tissues and forces fluid back into the blood vessels. This keeps fluid from entering the tissues. °· Restriction of fluid and salt intake. °· Use of a water pill (diuretic). These medications are appropriate only for some types of edema. They pull fluid out of your body and make you urinate more often. This  gets rid of fluid and reduces swelling, but diuretics can have side effects. Only use diuretics as directed by your health care provider. °HOME CARE INSTRUCTIONS  °· Keep the affected body part above the level of your heart when you are lying down.   °· Do not sit still or stand for prolonged periods.   °· Do not put anything directly under your knees when lying down. °· Do not wear constricting clothing or garters on your upper legs.   °· Exercise your legs to work the fluid back into your blood vessels. This may help the swelling go down.   °· Wear elastic bandages or support stockings to reduce ankle swelling as directed by your health care provider.   °· Eat a low-salt diet to reduce fluid if your health care provider recommends it.   °· Only take medicines as directed by your health care provider.  °SEEK MEDICAL CARE IF:  °· Your edema is not responding to treatment. °· You have heart, liver, or kidney disease and notice symptoms of edema. °· You have edema in your legs that does not improve after elevating them.   °· You have sudden and unexplained weight gain. °SEEK IMMEDIATE MEDICAL CARE IF:  °· You develop shortness of breath or chest pain.   °· You cannot breathe when you lie down. °· You develop pain, redness, or warmth in the swollen areas.   °· You have heart, liver, or kidney disease and suddenly get edema. °· You have a fever and your symptoms suddenly get worse. °MAKE SURE YOU:  °·   Understand these instructions.  Will watch your condition.  Will get help right away if you are not doing well or get worse.   This information is not intended to replace advice given to you by your health care provider. Make sure you discuss any questions you have with your health care provider.   Document Released: 03/08/2005 Document Revised: 03/29/2014 Document Reviewed: 12/29/2012 Elsevier Interactive Patient Education 2016 [employer]  EKG chest x-ray, urine and lab work have all been evaluated not  giving any indication for specific cause for your hypertension without any indication of end organ damage such as kidney insufficiency.  You are slightly anemic.  Recommend that you eat foods that are high in iron I discussed different treatment modalities with my supervising physician, you have been prescribed hydrochlorothiazide, which was a slightly different formulation of a diuretic.  Please start taking this every other day in the morning.  I also recommend that you wear support stockings to decrease the edema is important that you put them on first thing in the morning, before you start moving around too much.  And make an appointment with your primary care physician for further monitoring of your foot pressure. Return at any time, you developed headache, blurry vision, shortness of breath or chest pain

## 2015-09-08 ENCOUNTER — Other Ambulatory Visit: Payer: Self-pay | Admitting: Internal Medicine

## 2015-09-08 DIAGNOSIS — Z1231 Encounter for screening mammogram for malignant neoplasm of breast: Secondary | ICD-10-CM

## 2015-09-16 ENCOUNTER — Ambulatory Visit
Admission: RE | Admit: 2015-09-16 | Discharge: 2015-09-16 | Disposition: A | Discharge status: Home / Self Care | Payer: Managed Care, Other (non HMO) | Source: Ambulatory Visit | Attending: Internal Medicine | Admitting: Internal Medicine

## 2015-09-16 DIAGNOSIS — Z1231 Encounter for screening mammogram for malignant neoplasm of breast: Secondary | ICD-10-CM

## 2016-08-10 ENCOUNTER — Other Ambulatory Visit: Payer: Self-pay | Admitting: Internal Medicine

## 2016-08-10 DIAGNOSIS — Z1231 Encounter for screening mammogram for malignant neoplasm of breast: Secondary | ICD-10-CM

## 2016-09-01 ENCOUNTER — Other Ambulatory Visit: Payer: Self-pay | Admitting: Internal Medicine

## 2016-09-01 DIAGNOSIS — M79669 Pain in unspecified lower leg: Secondary | ICD-10-CM

## 2016-09-09 ENCOUNTER — Ambulatory Visit
Admission: RE | Admit: 2016-09-09 | Discharge: 2016-09-09 | Disposition: A | Discharge status: Home / Self Care | Payer: BLUE CROSS/BLUE SHIELD | Source: Ambulatory Visit | Attending: Internal Medicine | Admitting: Internal Medicine

## 2016-09-09 DIAGNOSIS — M79669 Pain in unspecified lower leg: Secondary | ICD-10-CM

## 2016-09-16 ENCOUNTER — Ambulatory Visit
Admission: RE | Admit: 2016-09-16 | Discharge: 2016-09-16 | Disposition: A | Discharge status: Home / Self Care | Payer: BLUE CROSS/BLUE SHIELD | Source: Ambulatory Visit | Attending: Internal Medicine | Admitting: Internal Medicine

## 2016-09-16 DIAGNOSIS — Z1231 Encounter for screening mammogram for malignant neoplasm of breast: Secondary | ICD-10-CM

## 2016-09-17 ENCOUNTER — Other Ambulatory Visit: Payer: Self-pay | Admitting: Internal Medicine

## 2016-09-17 DIAGNOSIS — R928 Other abnormal and inconclusive findings on diagnostic imaging of breast: Secondary | ICD-10-CM

## 2016-09-21 ENCOUNTER — Ambulatory Visit
Admission: RE | Admit: 2016-09-21 | Discharge: 2016-09-21 | Disposition: A | Discharge status: Home / Self Care | Payer: BLUE CROSS/BLUE SHIELD | Source: Ambulatory Visit | Attending: Internal Medicine | Admitting: Internal Medicine

## 2016-09-21 ENCOUNTER — Other Ambulatory Visit: Payer: Self-pay | Admitting: Internal Medicine

## 2016-09-21 DIAGNOSIS — R928 Other abnormal and inconclusive findings on diagnostic imaging of breast: Secondary | ICD-10-CM

## 2016-09-21 DIAGNOSIS — R921 Mammographic calcification found on diagnostic imaging of breast: Secondary | ICD-10-CM

## 2016-09-23 ENCOUNTER — Ambulatory Visit
Admission: RE | Admit: 2016-09-23 | Discharge: 2016-09-23 | Disposition: A | Discharge status: Home / Self Care | Payer: BLUE CROSS/BLUE SHIELD | Source: Ambulatory Visit | Attending: Internal Medicine | Admitting: Internal Medicine

## 2016-09-23 DIAGNOSIS — R921 Mammographic calcification found on diagnostic imaging of breast: Secondary | ICD-10-CM

## 2016-09-23 DIAGNOSIS — R928 Other abnormal and inconclusive findings on diagnostic imaging of breast: Secondary | ICD-10-CM

## 2017-03-29 ENCOUNTER — Emergency Department (HOSPITAL_COMMUNITY): Payer: BLUE CROSS/BLUE SHIELD

## 2017-03-29 ENCOUNTER — Emergency Department (HOSPITAL_COMMUNITY)
Admission: EM | Admit: 2017-03-29 | Discharge: 2017-03-29 | Disposition: A | Discharge status: Home / Self Care | Payer: BLUE CROSS/BLUE SHIELD | Attending: Emergency Medicine | Admitting: Emergency Medicine

## 2017-03-29 ENCOUNTER — Encounter (HOSPITAL_COMMUNITY): Payer: Self-pay | Admitting: Emergency Medicine

## 2017-03-29 DIAGNOSIS — Y929 Unspecified place or not applicable: Secondary | ICD-10-CM | POA: Insufficient documentation

## 2017-03-29 DIAGNOSIS — I10 Essential (primary) hypertension: Secondary | ICD-10-CM | POA: Insufficient documentation

## 2017-03-29 DIAGNOSIS — Z794 Long term (current) use of insulin: Secondary | ICD-10-CM | POA: Diagnosis not present

## 2017-03-29 DIAGNOSIS — Y999 Unspecified external cause status: Secondary | ICD-10-CM | POA: Insufficient documentation

## 2017-03-29 DIAGNOSIS — E119 Type 2 diabetes mellitus without complications: Secondary | ICD-10-CM | POA: Insufficient documentation

## 2017-03-29 DIAGNOSIS — Z79899 Other long term (current) drug therapy: Secondary | ICD-10-CM | POA: Diagnosis not present

## 2017-03-29 DIAGNOSIS — S92504A Nondisplaced unspecified fracture of right lesser toe(s), initial encounter for closed fracture: Secondary | ICD-10-CM

## 2017-03-29 DIAGNOSIS — W228XXA Striking against or struck by other objects, initial encounter: Secondary | ICD-10-CM | POA: Diagnosis not present

## 2017-03-29 DIAGNOSIS — Z7982 Long term (current) use of aspirin: Secondary | ICD-10-CM | POA: Diagnosis not present

## 2017-03-29 DIAGNOSIS — Z87891 Personal history of nicotine dependence: Secondary | ICD-10-CM | POA: Insufficient documentation

## 2017-03-29 DIAGNOSIS — S99921A Unspecified injury of right foot, initial encounter: Secondary | ICD-10-CM | POA: Diagnosis present

## 2017-03-29 DIAGNOSIS — Y939 Activity, unspecified: Secondary | ICD-10-CM | POA: Diagnosis not present

## 2017-03-29 NOTE — ED Provider Notes (Signed)
[facility] DEPT Provider Note   CSN: [id number] Arrival date & time: 03/29/17  1008     History   Chief Complaint Chief Complaint  Patient presents with  . right little toe pain     HPI   Blood pressure (!) 192/76, pulse 65, temperature (!) 97.4 F (36.3 C), resp. rate 17, SpO2 100 %.  COURTNE LIGHTY is a 62 y.o. female complaining of right small digit pain after stubbing it approximately 3 weeks ago.  She states that she has been taking some over-the-counter pain medication with little relief but is concerned because the pain has not improved over the course of several weeks.  There is no associated laceration, cut, ulceration.  Past Medical History:  Diagnosis Date  . DM (diabetes mellitus) ([place])   . Fatty liver disease, nonalcoholic   . Gastritis   . Gastroparesis   . GERD (gastroesophageal reflux disease)   . Hiatal hernia   . Hiatal hernia   . HLD (hyperlipidemia)   . HTN (hypertension)   . Renal calculus     Patient Active Problem List   Diagnosis Date Noted  . Abdominal pain, chronic, epigastric 12/11/2012  . Early satiety 12/11/2012  . Hiatal hernia   . Hx of cholecystectomy 02/05/2011  . Flu-like symptoms 02/05/2011  . CONSTIPATION 08/27/2008  . FATTY LIVER DISEASE 05/07/2008  . NAUSEA 04/23/2008  . EPIGASTRIC PAIN 04/23/2008  . RENAL CALCULUS 04/22/2008  . DIABETES MELLITUS II, UNCOMPLICATED 89/38/1017  . HYPERLIPIDEMIA 05/19/2006  . HYPERTENSION, BENIGN SYSTEMIC 05/19/2006  . GASTROESOPHAGEAL REFLUX, NO ESOPHAGITIS 05/19/2006  . AMENORRHEA 05/19/2006    Past Surgical History:  Procedure Laterality Date  . CHOLECYSTECTOMY    . COLONOSCOPY    . TUBAL LIGATION      OB History    No data available       Home Medications    Prior to Admission medications   Medication Sig Start Date End Date Taking? Authorizing Provider  aspirin EC 81 MG tablet Take 81 mg by mouth daily.    [provider]    Azilsartan Medoxomil (EDARBI) 40 MG TABS Take 40 mg by mouth daily.    [provider]  hydrochlorothiazide (HYDRODIURIL) 12.5 MG tablet Take 1 tablet (12.5 mg total) by mouth 3 (three) times a week. 08/07/15   Junius Creamer, NP  Insulin Degludec (TRESIBA FLEXTOUCH) 100 UNIT/ML SOPN Inject 42 Units into the skin every evening.    [provider]  pioglitazone-metformin (ACTOPLUS MET) 15-850 MG per tablet Take 1 tablet by mouth 2 (two) times daily with a meal.      [provider]  rosuvastatin (CRESTOR) 20 MG tablet Take 20 mg by mouth daily.     [provider]    Family History Family History  Problem Relation Age of Onset  . Diabetes Mother   . Diabetes Maternal Grandmother   . Stomach cancer Maternal Aunt        X 2 aunts  . Colon cancer Neg Hx     Social History Social History   Tobacco Use  . Smoking status: Former Smoker    Last attempt to quit: 09/10/1974    Years since quitting: 42.5  . Smokeless tobacco: Never Used  Substance Use Topics  . Alcohol use: No  . Drug use: No     Allergies   Patient has no known allergies.   Review of Systems Review of Systems  A complete review of systems was obtained  and all systems are negative except as noted in the HPI and PMH.   Physical Exam Updated Vital Signs BP (!) 192/76   Pulse 65   Temp (!) 97.4 F (36.3 C)   Resp 17   SpO2 100%   Physical Exam  Constitutional: She is oriented to person, place, and time. She appears well-developed and well-nourished. No distress.  HENT:  Head: Normocephalic and atraumatic.  Mouth/Throat: Oropharynx is clear and moist.  Eyes: Conjunctivae and EOM are normal. Pupils are equal, round, and reactive to light.  Neck: Normal range of motion.  Cardiovascular: Normal rate, regular rhythm and intact distal pulses.  Pulmonary/Chest: Effort normal and breath sounds normal.  Abdominal: Soft. There is no tenderness.  Musculoskeletal: Normal range of  motion. She exhibits tenderness. She exhibits no edema or deformity.  Right small toe with no erythema, lacerations, ulcerations, edema.  Diffusely tender to palpation, patient is neurovascularly intact.  Neurological: She is alert and oriented to person, place, and time.  Skin: She is not diaphoretic. No erythema.  Psychiatric: She has a normal mood and affect.  Nursing note and vitals reviewed.    ED Treatments / Results  Labs (all labs ordered are listed, but only abnormal results are displayed) Labs Reviewed - No data to display  EKG  EKG Interpretation None       Radiology Dg Foot Complete Right  Result Date: 03/29/2017 CLINICAL DATA:  Pain after catching foot in door frame EXAM: RIGHT FOOT COMPLETE - 3+ VIEW COMPARISON:  None. FINDINGS: Frontal, oblique, and lateral views obtained. There is a fracture of the midportion of the fifth proximal phalanx with alignment near anatomic in this area. No other fracture. No dislocation. Joint spaces appear normal. No erosive change. There are small spurs arising from the posterior and inferior calcaneus. IMPRESSION: Fracture midportion fifth proximal phalanx with alignment near anatomic at the fracture site. No other fracture. No dislocation. No appreciable joint space narrowing or erosion. There are calcaneal spurs. Electronically Signed   By: Lowella Grip III M.D.   On: 03/29/2017 10:40    Procedures Procedures (including critical care time)  Medications Ordered in ED Medications - No data to display   Initial Impression / Assessment and Plan / ED Course  I have reviewed the triage vital signs and the nursing notes.  Pertinent labs & imaging results that were available during my care of the patient were reviewed by me and considered in my medical decision making (see chart for details).    Vitals:   03/29/17 1016  BP: (!) 192/76  Pulse: 65  Resp: 17  Temp: (!) 97.4 F (36.3 C)  SpO2: 100%    HELAINE YACKEL is 62  y.o. female presenting with assistant right small digit pain after stubbing it several weeks ago x-ray with fracture.  Patient is a diabetic, this wound is closed.  No signs of infection.  Patient given crutches, postop shoe, instructed on how to buddy tape and will follow closely with primary care.  Evaluation does not show pathology that would require ongoing emergent intervention or inpatient treatment. Pt is hemodynamically stable and mentating appropriately. Discussed findings and plan with patient/guardian, who agrees with care plan. All questions answered. Return precautions discussed and outpatient follow up given.       Waynetta Pean 03/29/17 1331    Dorie Rank, MD 03/29/17 1622

## 2017-03-29 NOTE — ED Notes (Signed)
Bed: WTR9 Expected date:  Expected time:  Means of arrival:  Comments: 

## 2017-03-29 NOTE — ED Triage Notes (Signed)
Patient c/o right little toe pain that has been going on for over 3 weeks when patient hit it on something. reports that did have swelling but she put ice on it.

## 2017-03-29 NOTE — Discharge Instructions (Signed)
For pain control please take ibuprofen (also known as Motrin or Advil) 800mg  (this is normally 4 over the counter pills) 3 times a day  for 5 days. Take with food to minimize stomach irritation.  Please follow with your primary care doctor in the next 2 days for a check-up. They must obtain records for further management.   Do not hesitate to return to the Emergency Department for any new, worsening or concerning symptoms.

## 2017-04-02 ENCOUNTER — Emergency Department (HOSPITAL_COMMUNITY)
Admission: EM | Admit: 2017-04-02 | Discharge: 2017-04-02 | Disposition: A | Discharge status: Home / Self Care | Payer: BLUE CROSS/BLUE SHIELD | Attending: Emergency Medicine | Admitting: Emergency Medicine

## 2017-04-02 ENCOUNTER — Encounter (HOSPITAL_COMMUNITY): Payer: Self-pay | Admitting: Emergency Medicine

## 2017-04-02 ENCOUNTER — Emergency Department (HOSPITAL_COMMUNITY): Payer: BLUE CROSS/BLUE SHIELD

## 2017-04-02 DIAGNOSIS — Z87891 Personal history of nicotine dependence: Secondary | ICD-10-CM | POA: Diagnosis not present

## 2017-04-02 DIAGNOSIS — M25511 Pain in right shoulder: Secondary | ICD-10-CM | POA: Insufficient documentation

## 2017-04-02 DIAGNOSIS — Z79899 Other long term (current) drug therapy: Secondary | ICD-10-CM | POA: Insufficient documentation

## 2017-04-02 DIAGNOSIS — E119 Type 2 diabetes mellitus without complications: Secondary | ICD-10-CM | POA: Insufficient documentation

## 2017-04-02 DIAGNOSIS — Z7982 Long term (current) use of aspirin: Secondary | ICD-10-CM | POA: Diagnosis not present

## 2017-04-02 DIAGNOSIS — I10 Essential (primary) hypertension: Secondary | ICD-10-CM | POA: Diagnosis not present

## 2017-04-02 MED ORDER — OXYCODONE-ACETAMINOPHEN 5-325 MG PO TABS
1.0000 | ORAL_TABLET | Freq: Once | ORAL | Status: AC
  Administered 2017-04-02: 1 via ORAL
  Filled 2017-04-02: qty 1

## 2017-04-02 MED ORDER — DIAZEPAM 5 MG PO TABS
2.5000 mg | ORAL_TABLET | Freq: Once | ORAL | Status: AC
  Administered 2017-04-02: 2.5 mg via ORAL
  Filled 2017-04-02: qty 1

## 2017-04-02 MED ORDER — OXYCODONE-ACETAMINOPHEN 5-325 MG PO TABS: 1.0000 | ORAL_TABLET | ORAL | 0 refills | Status: DC | PRN

## 2017-04-02 MED ORDER — NAPROXEN 500 MG PO TABS: 500.0000 mg | ORAL_TABLET | Freq: Two times a day (BID) | ORAL | 0 refills | Status: DC

## 2017-04-02 MED ORDER — DEXAMETHASONE SODIUM PHOSPHATE 10 MG/ML IJ SOLN
10.0000 mg | Freq: Once | INTRAMUSCULAR | Status: AC
  Administered 2017-04-02: 10 mg via INTRAMUSCULAR
  Filled 2017-04-02: qty 1

## 2017-04-02 MED ORDER — HYDROMORPHONE HCL 2 MG/ML IJ SOLN
2.0000 mg | Freq: Once | INTRAMUSCULAR | Status: AC
  Administered 2017-04-02: 2 mg via INTRAMUSCULAR
  Filled 2017-04-02: qty 1

## 2017-04-02 MED ORDER — METHOCARBAMOL 500 MG PO TABS: 500.0000 mg | ORAL_TABLET | Freq: Two times a day (BID) | ORAL | 0 refills | Status: DC

## 2017-04-02 NOTE — ED Triage Notes (Addendum)
Patient her in from home with complaints of right shoulder pain that started 10 pm yesterday. Pain radiates down arm into fingers. Denies injury. States that the pain woke her from sleep.

## 2017-04-02 NOTE — Discharge Instructions (Addendum)
Shoulder pain could be due to tendinitis versus a pinched nerve.  You may use a sling for comfort, but please from the sling at least 3 times a day and complete range of motion exercises to help prevent frozen shoulder.  You may use Naprosyn and Robaxin to help with pain, and Percocet as needed for breakthrough pain.  These call to schedule follow-up appointment with Dr. Griffin Basil with orthopedics.  If you have significantly worsening pain, weakness or numbness in the right arm, chest pain or shortness of breath or other new or concerning symptoms please return to the ED for reevaluation.

## 2017-04-02 NOTE — ED Provider Notes (Signed)
[facility] DEPT Provider Note   CSN: [id number] Arrival date & time: 04/02/17  8295     History   Chief Complaint Chief Complaint  Patient presents with  . Shoulder Pain    HPI Leslie Davis is a 62 y.o. female.  Leslie Davis is a 62 y.o. Female with a history of diabetes, hypertension and hyperlipidemia, presents with acute onset of pain in the right shoulder that started around 10 PM yesterday and woke her from sleep.  Patient reports pain starts at the shoulder and she has some pain up into the right side of her neck as well as pain down her right arm.  Patient denies any inciting injury, pain was acute in onset and patient has never had pain in the shoulder before.  Patient denies any associated chest pain or shortness of breath.  No fevers or chills no redness, warmth or swelling of the shoulder.  Patient describes the pain as a dull constant ache, pain is not shooting or sharp in nature.  Patient took a dose of ibuprofen prior to arrival, and reports minimal improvement with this.  She denies any numbness or weakness.  No symptoms on the left side.      Past Medical History:  Diagnosis Date  . DM (diabetes mellitus) ([place])   . Fatty liver disease, nonalcoholic   . Gastritis   . Gastroparesis   . GERD (gastroesophageal reflux disease)   . Hiatal hernia   . Hiatal hernia   . HLD (hyperlipidemia)   . HTN (hypertension)   . Renal calculus     Patient Active Problem List   Diagnosis Date Noted  . Abdominal pain, chronic, epigastric 12/11/2012  . Early satiety 12/11/2012  . Hiatal hernia   . Hx of cholecystectomy 02/05/2011  . Flu-like symptoms 02/05/2011  . CONSTIPATION 08/27/2008  . FATTY LIVER DISEASE 05/07/2008  . NAUSEA 04/23/2008  . EPIGASTRIC PAIN 04/23/2008  . RENAL CALCULUS 04/22/2008  . DIABETES MELLITUS II, UNCOMPLICATED 62/13/0865  . HYPERLIPIDEMIA 05/19/2006  . HYPERTENSION, BENIGN SYSTEMIC 05/19/2006  .  GASTROESOPHAGEAL REFLUX, NO ESOPHAGITIS 05/19/2006  . AMENORRHEA 05/19/2006    Past Surgical History:  Procedure Laterality Date  . CHOLECYSTECTOMY    . COLONOSCOPY    . TUBAL LIGATION      OB History    No data available       Home Medications    Prior to Admission medications   Medication Sig Start Date End Date Taking? Authorizing Provider  aspirin EC 81 MG tablet Take 81 mg by mouth daily.    [provider]  Azilsartan Medoxomil (EDARBI) 40 MG TABS Take 40 mg by mouth daily.    [provider]  hydrochlorothiazide (HYDRODIURIL) 12.5 MG tablet Take 1 tablet (12.5 mg total) by mouth 3 (three) times a week. 08/07/15   Junius Creamer, NP  Insulin Degludec (TRESIBA FLEXTOUCH) 100 UNIT/ML SOPN Inject 42 Units into the skin every evening.    [provider]  pioglitazone-metformin (ACTOPLUS MET) 15-850 MG per tablet Take 1 tablet by mouth 2 (two) times daily with a meal.      [provider]  rosuvastatin (CRESTOR) 20 MG tablet Take 20 mg by mouth daily.     [provider]    Family History Family History  Problem Relation Age of Onset  . Diabetes Mother   . Diabetes Maternal Grandmother   . Stomach cancer Maternal Aunt        X 2  aunts  . Colon cancer Neg Hx     Social History Social History   Tobacco Use  . Smoking status: Former Smoker    Last attempt to quit: 09/10/1974    Years since quitting: 42.5  . Smokeless tobacco: Never Used  Substance Use Topics  . Alcohol use: No  . Drug use: No     Allergies   Patient has no known allergies.   Review of Systems Review of Systems  Constitutional: Negative for chills and fever.  Respiratory: Negative for chest tightness and shortness of breath.   Cardiovascular: Positive for chest pain. Negative for palpitations.  Gastrointestinal: Negative for abdominal pain, nausea and vomiting.  Musculoskeletal: Positive for arthralgias and neck pain. Negative for back pain and joint  swelling.  Skin: Negative for color change.  Neurological: Negative for weakness, numbness and headaches.     Physical Exam Updated Vital Signs BP (!) 159/70 (BP Location: Left Arm)   Pulse (!) 59   Temp 98.8 F (37.1 C) (Oral)   Resp 18   SpO2 98%   Physical Exam  Constitutional: She appears well-developed and well-nourished. No distress.  HENT:  Head: Normocephalic and atraumatic.  Eyes: Right eye exhibits no discharge. Left eye exhibits no discharge.  Cardiovascular: Normal rate, regular rhythm and normal heart sounds.  Pulses:      Radial pulses are 2+ on the right side, and 2+ on the left side.  Pulmonary/Chest: Effort normal and breath sounds normal. No stridor. No respiratory distress. She has no wheezes. She has no rales.  Musculoskeletal:  Tenderness to palpation over the anterior joint space of the right shoulder, no palpable deformity, erythema, warmth or swelling.  No tenderness over the scapula..  Pain is worsened with range of motion, active range of motion severely limited by pain, full passive range of motion with some discomfort.  Mild tenderness throughout the arm, no paresthesias on exam.  Radial pulses 2+ and equal bilaterally, sensation intact, normal grip strength  Neurological: She is alert. Coordination normal.  Skin: Skin is warm and dry. Capillary refill takes less than 2 seconds. She is not diaphoretic.  Psychiatric: She has a normal mood and affect. Her behavior is normal.  Nursing note and vitals reviewed.    ED Treatments / Results  Labs (all labs ordered are listed, but only abnormal results are displayed) Labs Reviewed - No data to display  EKG  EKG Interpretation  Date/Time:  Saturday April 02 2017 07:54:12 EST Ventricular Rate:  64 PR Interval:    QRS Duration: 90 QT Interval:  418 QTC Calculation: 432 R Axis:   35 Text Interpretation:  Sinus rhythm Nonspecific T abnormalities, lateral leads No significant change was found Confirmed  by Jola Schmidt [phone]) on 04/02/2017 8:29:34 AM       Radiology Dg Shoulder Right  Result Date: 04/02/2017 CLINICAL DATA:  RIGHT shoulder pain for 10 hours.  No injury. EXAM: RIGHT SHOULDER - 2+ VIEW COMPARISON:  None. FINDINGS: The humeral head is well-formed and located. Coarse calcification projecting at greater tuberosity insertion. The subacromial, glenohumeral and acromioclavicular joint spaces are intact. No destructive bony lesions. Subcentimeter loose body. Soft tissue planes are non-suspicious. IMPRESSION: Calcification at supraspinatus insertion seen with calcific tendinitis. Loose body. Electronically Signed   By: Elon Alas M.D.   On: 04/02/2017 06:14    Procedures Procedures (including critical care time)  Medications Ordered in ED Medications  oxyCODONE-acetaminophen (PERCOCET/ROXICET) 5-325 MG per tablet 1 tablet (1 tablet Oral Given 04/02/17  1610)  diazepam (VALIUM) tablet 2.5 mg (2.5 mg Oral Given 04/02/17 0746)     Initial Impression / Assessment and Plan / ED Course  I have reviewed the triage vital signs and the nursing notes.  Pertinent labs & imaging results that were available during my care of the patient were reviewed by me and considered in my medical decision making (see chart for details).  Presents with acute onset right shoulder pain, with some pain extending down into the arm.  Pain is worse with movement.  No associated chest pain or shortness of breath.  No preceding injury or previous shoulder pain.  On exam patient is overall well-appearing, mildly hypertensive but vitals otherwise normal.  Tenderness to palpation over the anterior joint space, right upper extremity is neurovascularly intact.  X-ray does show calcification of the supraspinatus tendon, patient could be experiencing calcific tendinitis.  Given acute onset of symptoms while patient was not active will get EKG to rule out atypical ACS.  Percocet and Valium for pain.  EKG shows sinus  rhythm, unchanged when compared to previous, reassuring, doubt ACS without any associated chest pain or shortness of breath and pain is highly associated with movement.  Will reassess after pain medicine.  After Valium and Percocet patient reports no improvement in pain.  Discussed patient with Dr. Venora Maples who evaluated patient as well, will give 2 mg of Dilaudid and provide patient with sling for comfort.  Dr. Venora Maples agrees that at this time there does not appear to be any evidence of an acute emergency medical condition.  On reevaluation patient reports improvement in pain has improved mobility of the shoulder.  She is stable for discharge home with follow-up with Dr. Griffin Basil from orthopedics.  Will discharge with NSAIDs, Robaxin and Percocet for breakthrough pain.  Will look to patient in [employer], no recent prescriptions for controlled substances.  Strict return precautions discussed.  Patient expresses understanding and is in agreement with plan.  Patient discussed with Dr. Venora Maples, who saw patient as well and agrees with plan.   Final Clinical Impressions(s) / ED Diagnoses   Final diagnoses:  Acute pain of right shoulder    ED Discharge Orders        Ordered    oxyCODONE-acetaminophen (PERCOCET) 5-325 MG tablet  Every 4 hours PRN     04/02/17 1047    methocarbamol (ROBAXIN) 500 MG tablet  2 times daily     04/02/17 1047    naproxen (NAPROSYN) 500 MG tablet  2 times daily     04/02/17 1047       Janet Berlin 04/02/17 1053    Jola Schmidt, MD 04/03/17 [phone]

## 2017-06-08 ENCOUNTER — Other Ambulatory Visit: Payer: Self-pay | Admitting: Internal Medicine

## 2017-06-08 DIAGNOSIS — Z139 Encounter for screening, unspecified: Secondary | ICD-10-CM

## 2017-06-08 DIAGNOSIS — E2839 Other primary ovarian failure: Secondary | ICD-10-CM

## 2017-09-19 ENCOUNTER — Ambulatory Visit
Admission: RE | Admit: 2017-09-19 | Discharge: 2017-09-19 | Disposition: A | Discharge status: Home / Self Care | Payer: BLUE CROSS/BLUE SHIELD | Source: Ambulatory Visit | Attending: Internal Medicine | Admitting: Internal Medicine

## 2017-09-19 DIAGNOSIS — E2839 Other primary ovarian failure: Secondary | ICD-10-CM

## 2017-09-19 DIAGNOSIS — Z139 Encounter for screening, unspecified: Secondary | ICD-10-CM

## 2017-09-20 ENCOUNTER — Other Ambulatory Visit: Payer: Self-pay | Admitting: Internal Medicine

## 2017-09-20 DIAGNOSIS — R928 Other abnormal and inconclusive findings on diagnostic imaging of breast: Secondary | ICD-10-CM

## 2017-09-27 ENCOUNTER — Ambulatory Visit
Admission: RE | Admit: 2017-09-27 | Discharge: 2017-09-27 | Disposition: A | Discharge status: Home / Self Care | Payer: BLUE CROSS/BLUE SHIELD | Source: Ambulatory Visit | Attending: Internal Medicine | Admitting: Internal Medicine

## 2017-09-27 DIAGNOSIS — R928 Other abnormal and inconclusive findings on diagnostic imaging of breast: Secondary | ICD-10-CM

## 2017-10-03 ENCOUNTER — Encounter: Payer: Self-pay | Admitting: Gastroenterology

## 2017-11-15 ENCOUNTER — Encounter

## 2017-11-15 ENCOUNTER — Encounter: Payer: Self-pay | Admitting: Gastroenterology

## 2017-11-15 ENCOUNTER — Ambulatory Visit: Payer: BLUE CROSS/BLUE SHIELD | Admitting: Gastroenterology

## 2017-11-15 VITALS — BP 128/64 | HR 72 | Ht 64.0 in | Wt 238.2 lb

## 2017-11-15 DIAGNOSIS — R6881 Early satiety: Secondary | ICD-10-CM | POA: Diagnosis not present

## 2017-11-15 DIAGNOSIS — R1010 Upper abdominal pain, unspecified: Secondary | ICD-10-CM | POA: Diagnosis not present

## 2017-11-15 DIAGNOSIS — K449 Diaphragmatic hernia without obstruction or gangrene: Secondary | ICD-10-CM

## 2017-11-15 DIAGNOSIS — K2960 Other gastritis without bleeding: Secondary | ICD-10-CM | POA: Diagnosis not present

## 2017-11-15 DIAGNOSIS — R112 Nausea with vomiting, unspecified: Secondary | ICD-10-CM | POA: Diagnosis not present

## 2017-11-15 DIAGNOSIS — K760 Fatty (change of) liver, not elsewhere classified: Secondary | ICD-10-CM

## 2017-11-15 NOTE — Progress Notes (Signed)
GASTROENTEROLOGY OUTPATIENT CLINIC VISIT   Primary Care Provider Leslie Davis, Leslie Davis Leslie Davis [address] [place] [address] [phone]  Patient Profile: Leslie Davis is a 62 y.o. female with a pmh significant for DM, HLD, GERD, Erosive Gastritis (negative HP Bx & Serology), Fatty Liver (based on U/S) s/p cholecystectomy.  The patient presents to the [facility] for an evaluation and management of problem(s) noted below:  Problem List 1. Erosive gastritis   2. Early satiety   3. Pain of upper abdomen   4. Non-intractable vomiting with nausea, unspecified vomiting type   5. Fatty liver   6. Hiatal hernia     History of Present Illness Leslie Davis was last seen in July 2016 and she was previously followed by Dr. Sharlett Iles as well as our physician assistants and  GI.  Today she presents for a new patient clinic visit.  In brief, this patient has had ongoing abdominal discomfort in the upper portion of her abdomen for years.  She describes a sharp and dull pain that occur in the upper abdomen bilaterally.   She does not note changes as a result of fasting or eating.  This discomfort can last up to 30 minutes and at times has gone for 2 hours.  However, the patient can then go for many hours in between periods of pain.  It has been years, since she last had a day without pain.  There has been concern about the possibility of underlying gastroparesis but she has had previous gastric emptying scans that have been normal.  She had undergone upper endoscopy as well as colonoscopy by Dr. Sharlett Iles in the past.  Her last upper endoscopy in 2014 did show evidence of erosive gastritis with negative HP biopsies.  She had been on acid reducing medicines but has been off of those for years.  She is unclear why she came off of those.  Once again the patient has had recurrence of her abdominal discomfort.  She describes having a sensation of abdominal fullness that can  occur at anytime of the day though morning is when she thinks it is worse.  She also has a manifestation of nausea on a daily basis as soon as she wakes up that persists during the course of the day.  She does have episodes of emesis though she denies any coffee grounds or hematemesis.  She describes having episodes of vomiting only 1-2 times per month.  The patient's weight is grossly unchanged.  The patient does take NSAIDs infrequently for arthralgia pain or for headaches.  She does not take significant BC/Goody powders.  She is previously had an upper and lower endoscopy as noted below.  She describes her diabetes not being better controlled with last A1c (not in our system) to be in the 6 range.  GI Review of Systems Positive as above heartburn depending on what she eats she has been off PPI for years but was only taking once daily at the time; a couple of months ago she had dark stool for 2-3 days that was looser but not clear it was overt melena,  Negative for dysphagia, odynophagia, hematochezia  Review of Systems General: Denies fevers/chills HEENT: Denies oral lesions Cardiovascular: Denies chest pain/chest pressure/palpitations Pulmonary: Denies shortness of breath Gastroenterological: See HPI Genitourinary: Denies hematuria or darkened urine Hematological: Positive for easy bruising and bleeding which she has had for years Endocrine: Denies cold or heat intolerance Psychological: Mood is stable Musculoskeletal: Denies arthralgias/joint swelling  Medications Current Outpatient Medications  Medication Sig Dispense Refill  . amLODipine (NORVASC) 10 MG tablet Take 10 mg by mouth daily.    Marland Kitchen aspirin EC 81 MG tablet Take 81 mg by mouth daily.    . Azilsartan Medoxomil (EDARBI) 40 MG TABS Take 40 mg by mouth daily.    . hydrochlorothiazide (HYDRODIURIL) 12.5 MG tablet Take 1 tablet (12.5 mg total) by mouth 3 (three) times a week. 30 tablet 0  . Insulin Glargine (TOUJEO MAX SOLOSTAR)  300 UNIT/ML SOPN Inject 40 Units into the skin daily.    . metFORMIN (GLUCOPHAGE) 850 MG tablet Take 850 mg by mouth 2 (two) times daily with a meal.    . Semaglutide (OZEMPIC [place]) Inject 5 Units into the skin.     No current facility-administered medications for this visit.    Allergies No Known Allergies  Histories Past Medical History:  Diagnosis Date  . DM (diabetes mellitus) (Lupus)   . Fatty liver disease, nonalcoholic   . Gastritis   . Gastroparesis   . GERD (gastroesophageal reflux disease)   . Hiatal hernia   . Hiatal hernia   . HLD (hyperlipidemia)   . HTN (hypertension)   . Renal calculus    Past Surgical History:  Procedure Laterality Date  . CHOLECYSTECTOMY    . COLONOSCOPY    . TUBAL LIGATION     Social History   Socioeconomic History  . Marital status: Married    Spouse name: Not on file  . Number of children: 2  . Years of education: Not on file  . Highest education level: Not on file  Occupational History  . Occupation: housekeeping  Social Needs  . Financial resource strain: Not on file  . Food insecurity:    Worry: Not on file    Inability: Not on file  . Transportation needs:    Medical: Not on file    Non-medical: Not on file  Tobacco Use  . Smoking status: Former Smoker    Last attempt to quit: 09/10/1974    Years since quitting: 43.2  . Smokeless tobacco: Never Used  Substance and Sexual Activity  . Alcohol use: No  . Drug use: No  . Sexual activity: Not on file  Lifestyle  . Physical activity:    Days per week: Not on file    Minutes per session: Not on file  . Stress: Not on file  Relationships  . Social connections:    Talks on phone: Not on file    Gets together: Not on file    Attends religious service: Not on file    Active member of club or organization: Not on file    Attends meetings of clubs or organizations: Not on file    Relationship status: Not on file  . Intimate partner violence:    Fear of current or ex partner:  Not on file    Emotionally abused: Not on file    Physically abused: Not on file    Forced sexual activity: Not on file  Other Topics Concern  . Not on file  Social History Narrative  . Not on file   Family History  Problem Relation Age of Onset  . Diabetes Mother   . Diabetes Maternal Grandmother   . Stomach cancer Maternal Aunt        X 2 aunts  . Colon cancer Neg Hx    I have reviewed her medical, social, and family history in detail and updated the electronic  medical record as necessary.    PHYSICAL EXAMINATION  BP 128/64   Pulse 72   Ht 5\' 4"  (1.626 m)   Wt 238 lb 4 oz (108.1 kg)   BMI 40.90 kg/m  GEN: NAD, appears stated age PSYCH: Cooperative, without pressured speech EYE: Conjunctivae pink, sclerae anicteric ENT: MMM, without oral ulcers NECK: Supple CV: RR without R/Gs  RESP: Decreased BS at the bases bilaterally GI: NABS, soft, NT/ND, without rebound or guarding, no HSM appreciated GU: DRE shows MSK/EXT: _ edema, no palmar erythema, no Dupuytren's contractures SKIN: No concerning rashes/lesions, no spider angiomata HEME: No LAD appreciated NEURO:  Alert & Oriented x 3, no focal deficits, no evidence of asterixis   REVIEW OF DATA  I reviewed the following data at the time of this encounter:  GI Procedures and Studies  2014 EGD   2013 Colonoscopy   2012 EGD   Laboratory Studies  Reviewed in [facility]  Imaging Studies  2016 SF-GES IMPRESSION: Normal gastric emptying study.  2015 CXR/KUB IMPRESSION: 1. Unremarkable bowel gas pattern; no free intra-abdominal air seen. 2. No acute cardiopulmonary process identified.   ASSESSMENT  Ms. Oliveri is a 62 y.o. female with a pmh significant for DM, HLD, GERD, Erosive Gastritis (negative HP Bx & Serology), Fatty Liver (based on U/S), s/p cholecystectomy.  The patient is seen today for a return visit for evaluation and management of:  1. Erosive gastritis   2. Early satiety   3. Pain of upper abdomen     4. Non-intractable vomiting with nausea, unspecified vomiting type   5. Fatty liver   6. Hiatal hernia    This is a patient is hemodynamically and clinically stable.  She has a long history of greater than 15 years worth of abdominal pain and was previously followed by the [facility].  Her symptoms are manifested as abdominal discomfort in the upper abdomen with associated early satiety and at times pyrosis.  She has a history of a hiatal hernia that has not been repaired though based endoscopically this does not look to be a large hernia.  In the past she has had erosive gastritis with negative HP biopsies as well as serology.  Based on her current symptoms, I think it is reasonable to consider a repeat endoscopy to evaluate her upper GI tract.  We will obtain basic laboratories to also follow-up a previously noted fatty liver on ultrasound.  She is in an age group with the baby boomers that would be necessary to send off hepatitis C testing and that will be done today.  She has a history of anemia as well and we will send off basic lab tests to evaluate for iron deficiency.  She is off PPI therapy for the last few years and she had prior erosive gastritis I would like to send a gastrin level.  For her nausea to evaluate for adrenal insufficiency we will send off an am cortisol as well as thyroid function.  After her endoscopy is completed we will maintain her on PPI therapy for a total of 4 to 8 weeks.  We will hold on small intestine bacterial overgrowth testing at this point in time but consider in the future.  If she continues to have symptoms through her PPI she has not had cross-sectional imaging in many years and it would be reasonable to consider a cross-sectional CT to evaluate for any other pathology.  The risks and benefits of endoscopic evaluation were discussed with the patient; these  include but are not limited to the risk of perforation, infection, bleeding, missed lesions, lack of  diagnosis, severe illness requiring hospitalization, as well as anesthesia and sedation related illnesses.  The patient is agreeable to proceed.  All questions were answered to the best of my ability.  Patient was appreciative for the consult.  PLAN  1. Erosive gastritis - Proceed to EGD - CBC with Differential/Platelet; Future - Ferritin; Future - HCV Genotype Reflex NS3/4A - Gastrin - Iron Binding Cap (TIBC); Future  2. Early satiety  3. Pain of upper abdomen - PPI therapy to be initiated after Gastrin level and EGD are completed  4. Non-intractable vomiting with nausea, unspecified vomiting type - TSH; Future - Cortisol; Future - Lipase; Future - Amylase; Future  5. Fatty liver - Comprehensive metabolic panel  6. Hiatal hernia   Orders Placed This Encounter  Procedures  . CBC with Differential/Platelet  . Comprehensive metabolic panel  . Ferritin  . TSH  . Cortisol  . Lipase  . Amylase  . HCV Genotype Reflex NS3/4A  . Gastrin  . Iron Binding Cap (TIBC)  . Ambulatory referral to Gastroenterology    New Prescriptions   No medications on file   Modified Medications   No medications on file    Planned Follow Up: No follow-ups on file.   Justice Britain, MD [facility] Office # [phone]

## 2017-11-15 NOTE — Patient Instructions (Addendum)
CBC, CMP, Iron/TIBC, Ferritin, TSH with reflex, Cortisol AM, Lipase, Amylase, HCV with reflex. Gastrin level  You have been scheduled for an endoscopy . Please follow the written instructions given to you at your visit today. Please pick up your prep supplies at the pharmacy within the next 1-3 days. If you use inhalers (even only as needed), please bring them with you on the day of your procedure. Your physician has requested that you go to www.startemmi.com and enter the access code given to you at your visit today. This web site gives a general overview about your procedure. However, you should still follow specific instructions given to you by our office regarding your preparation for the procedure.  Your provider has requested that you go to the basement level for lab work on 11/16/17 at [facility] on the elevator. The lab is located at the first door on the left as you exit the elevator.  Normal BMI (Body Mass Index- based on height and weight) is between 19 and 25. Your BMI today is Body mass index is 40.9 kg/m. Marland Kitchen Please consider follow up  regarding your BMI with your Primary Care Provider.

## 2017-11-16 ENCOUNTER — Other Ambulatory Visit: Payer: Self-pay

## 2017-11-16 ENCOUNTER — Other Ambulatory Visit (INDEPENDENT_AMBULATORY_CARE_PROVIDER_SITE_OTHER): Payer: BLUE CROSS/BLUE SHIELD

## 2017-11-16 DIAGNOSIS — K760 Fatty (change of) liver, not elsewhere classified: Secondary | ICD-10-CM | POA: Insufficient documentation

## 2017-11-16 DIAGNOSIS — K2960 Other gastritis without bleeding: Secondary | ICD-10-CM

## 2017-11-16 LAB — CBC WITH DIFFERENTIAL/PLATELET
Basophils Absolute: 0 10*3/uL (ref 0.0–0.1)
Basophils Relative: 0.7 % (ref 0.0–3.0)
Eosinophils Absolute: 0.2 10*3/uL (ref 0.0–0.7)
Eosinophils Relative: 3.8 % (ref 0.0–5.0)
HCT: 35.4 % — ABNORMAL LOW (ref 36.0–46.0)
Hemoglobin: 11.7 g/dL — ABNORMAL LOW (ref 12.0–15.0)
Lymphocytes Relative: 38.2 % (ref 12.0–46.0)
Lymphs Abs: 1.9 10*3/uL (ref 0.7–4.0)
MCHC: 33.1 g/dL (ref 30.0–36.0)
MCV: 86.6 fl (ref 78.0–100.0)
Monocytes Absolute: 0.7 10*3/uL (ref 0.1–1.0)
Monocytes Relative: 13.5 % — ABNORMAL HIGH (ref 3.0–12.0)
Neutro Abs: 2.2 10*3/uL (ref 1.4–7.7)
Neutrophils Relative %: 43.8 % (ref 43.0–77.0)
Platelets: 273 10*3/uL (ref 150.0–400.0)
RBC: 4.09 Mil/uL (ref 3.87–5.11)
RDW: 16.1 % — ABNORMAL HIGH (ref 11.5–15.5)
WBC: 4.9 10*3/uL (ref 4.0–10.5)

## 2017-11-16 LAB — LIPASE: Lipase: 46 U/L (ref 11.0–59.0)

## 2017-11-16 LAB — AMYLASE: Amylase: 44 U/L (ref 27–131)

## 2017-11-16 LAB — CORTISOL: Cortisol, Plasma: 12.1 ug/dL

## 2017-11-16 LAB — TSH: TSH: 3.02 u[IU]/mL (ref 0.35–4.50)

## 2017-11-16 LAB — FERRITIN: Ferritin: 45.2 ng/mL (ref 10.0–291.0)

## 2017-11-17 ENCOUNTER — Encounter: Payer: Self-pay | Admitting: Gastroenterology

## 2017-11-17 ENCOUNTER — Other Ambulatory Visit (INDEPENDENT_AMBULATORY_CARE_PROVIDER_SITE_OTHER): Payer: BLUE CROSS/BLUE SHIELD

## 2017-11-17 ENCOUNTER — Ambulatory Visit (AMBULATORY_SURGERY_CENTER): Payer: BLUE CROSS/BLUE SHIELD | Admitting: Gastroenterology

## 2017-11-17 ENCOUNTER — Other Ambulatory Visit: Payer: Self-pay | Admitting: Gastroenterology

## 2017-11-17 VITALS — BP 174/79 | HR 63 | Temp 96.6°F | Resp 47 | Ht 64.0 in | Wt 238.0 lb

## 2017-11-17 DIAGNOSIS — K2960 Other gastritis without bleeding: Secondary | ICD-10-CM

## 2017-11-17 DIAGNOSIS — R112 Nausea with vomiting, unspecified: Secondary | ICD-10-CM

## 2017-11-17 LAB — IRON AND TIBC
Iron Saturation: 19 % (ref 15–55)
Iron: 68 ug/dL (ref 27–139)
Total Iron Binding Capacity: 355 ug/dL (ref 250–450)
UIBC: 287 ug/dL (ref 118–369)

## 2017-11-17 LAB — COMPREHENSIVE METABOLIC PANEL
ALT: 16 U/L (ref 0–35)
AST: 18 U/L (ref 0–37)
Albumin: 3.9 g/dL (ref 3.5–5.2)
Alkaline Phosphatase: 97 U/L (ref 39–117)
BUN: 18 mg/dL (ref 6–23)
CO2: 26 mEq/L (ref 19–32)
Calcium: 9.3 mg/dL (ref 8.4–10.5)
Chloride: 105 mEq/L (ref 96–112)
Creatinine, Ser: 0.94 mg/dL (ref 0.40–1.20)
GFR: 77.09 mL/min (ref >60.00)
Glucose, Bld: 91 mg/dL (ref 70–99)
Potassium: 3.8 mEq/L (ref 3.5–5.1)
Sodium: 139 mEq/L (ref 135–145)
Total Bilirubin: 0.4 mg/dL (ref 0.2–1.2)
Total Protein: 7.4 g/dL (ref 6.0–8.3)

## 2017-11-17 MED ORDER — SODIUM CHLORIDE 0.9 % IV SOLN: 500.0000 mL | Freq: Once | INTRAVENOUS | Status: DC

## 2017-11-17 MED ORDER — FERROUS GLUCONATE 324 (38 FE) MG PO TABS: 324.0000 mg | ORAL_TABLET | Freq: Two times a day (BID) | ORAL | 4 refills | Status: DC

## 2017-11-17 MED ORDER — PANTOPRAZOLE SODIUM 40 MG PO TBEC: 40.0000 mg | DELAYED_RELEASE_TABLET | Freq: Every day | ORAL | 3 refills | Status: DC

## 2017-11-17 MED ORDER — DEXTROSE 5 % IV SOLN: INTRAVENOUS | Status: DC

## 2017-11-17 NOTE — Patient Instructions (Signed)
YOU HAD AN ENDOSCOPIC PROCEDURE TODAY: Refer to the procedure report and other information in the discharge instructions given to you for any specific questions about what was found during the examination. If this information does not answer your questions, please call [facility] office at [phone] to clarify.   YOU SHOULD EXPECT: Some feelings of bloating in the abdomen. Passage of more gas than usual. Walking can help get rid of the air that was put into your GI tract during the procedure and reduce the bloating. If you had a lower endoscopy (such as a colonoscopy or flexible sigmoidoscopy) you may notice spotting of blood in your stool or on the toilet paper. Some abdominal soreness may be present for a day or two, also.  DIET: Your first meal following the procedure should be a light meal and then it is ok to progress to your normal diet. A half-sandwich or bowl of soup is an example of a good first meal. Heavy or fried foods are harder to digest and may make you feel nauseous or bloated. Drink plenty of fluids but you should avoid alcoholic beverages for 24 hours. If you had a esophageal dilation, please see attached instructions for diet.    ACTIVITY: Your care partner should take you home directly after the procedure. You should plan to take it easy, moving slowly for the rest of the day. You can resume normal activity the day after the procedure however YOU SHOULD NOT DRIVE, use power tools, machinery or perform tasks that involve climbing or major physical exertion for 24 hours (because of the sedation medicines used during the test).   SYMPTOMS TO REPORT IMMEDIATELY: A gastroenterologist can be reached at any hour. Please call [phone]  for any of the following symptoms:   Following upper endoscopy (EGD, EUS, ERCP, esophageal dilation) Vomiting of blood or coffee ground material  New, significant abdominal pain  New, significant chest pain or pain under the shoulder blades  Painful or  persistently difficult swallowing  New shortness of breath  Black, tarry-looking or red, bloody stools  FOLLOW UP:  If any biopsies were taken you will be contacted by phone or by letter within the next 1-3 weeks. Call [phone]  if you have not heard about the biopsies in 3 weeks.  Please also call with any specific questions about appointments or follow up tests.  

## 2017-11-17 NOTE — Progress Notes (Signed)
Report given to PACU, vss 

## 2017-11-17 NOTE — Op Note (Signed)
[facility] Patient Name: Leslie Davis Procedure Date: 11/17/2017 7:52 AM MRN: [id number] Endoscopist: Justice Britain , MD Age: 62 Referring MD:  Date of Birth: 1955-09-08 Gender: Female Account #: [id number] Procedure:                Upper GI endoscopy Indications:              Dysphagia, Heartburn, Esophageal reflux, Personal                            history of peptic ulcer disease, Prior hitory of                            erosive gastritis (negative HP biopsies years ago) Medicines:                Monitored Anesthesia Care Procedure:                Pre-Anesthesia Assessment:                           - Prior to the procedure, a History and Physical                            was performed, and patient medications and                            allergies were reviewed. The patient's tolerance of                            previous anesthesia was also reviewed. The risks                            and benefits of the procedure and the sedation                            options and risks were discussed with the patient.                            All questions were answered, and informed consent                            was obtained. Prior Anticoagulants: The patient has                            taken aspirin. ASA Grade Assessment: II - A patient                            with mild systemic disease. After reviewing the                            risks and benefits, the patient was deemed in                            satisfactory condition to undergo the procedure.  After obtaining informed consent, the endoscope was                            passed under direct vision. Throughout the                            procedure, the patient's blood pressure, pulse, and                            oxygen saturations were monitored continuously. The                            Model GIF-[id number]) scope was introduced            through the mouth, and advanced to the second part                            of duodenum. The upper GI endoscopy was                            accomplished without difficulty. The patient                            tolerated the procedure well. Scope In: Scope Out: Findings:                 The examined esophagus was mildly tortuous.                           Normal mucosa was found in the entire esophagus.                            Biopsies were taken with a cold forceps for                            histology from the proximal/middle/distal esophagus                            and placed within the same jar.                           The Z-line was regular and was found 37 cm from the                            incisors.                           A small hiatal hernia was present.                           Patchy mildly erythematous mucosa without bleeding                            was found on the greater curvature of the stomach.  Otherwise, no gross lesions were noted in the                            entire examined stomach. Biopsies were taken with a                            cold forceps for histology and Helicobacter pylori                            testing from the antrum/incisura/greater                            curve/lesser curve/fundus/cardia.                           No gross lesions were noted in the duodenal bulb,                            in the first portion of the duodenum and in the                            second portion of the duodenum. Complications:            No immediate complications. Estimated Blood Loss:     Estimated blood loss was minimal. Impression:               - Tortuous esophagus.                           - Normal mucosa was found in the entire esophagus.                            Biopsied for EoE.                           - Z-line regular, 37 cm from the incisors.                           - Small  hiatal hernia.                           - Mild, erythematous mucosa in the greater                            curvature. No other gross lesions in the stomach.                            Biopsied for HP.                           - No gross lesions in the duodenal bulb, in the                            first portion of the duodenum and in the second  portion of the duodenum. Recommendation:           - The patient will be observed post-procedure,                            until all discharge criteria are met.                           - Discharge patient to home.                           - Patient has a contact number available for                            emergencies. The signs and symptoms of potential                            delayed complications were discussed with the                            patient. Return to normal activities tomorrow.                            Written discharge instructions were provided to the                            patient.                           - Resume previous diet.                           - Continue present medications.                           - Await pathology results.                           - Complete laboratory workup as had been previously                            planned CMP/HCV with Reflex/Gastrin Level                           - Start PPI 40 mg once daily (Rx sent to patient's                            pharmacy).                           - Initiate Iron supplementation (slight Iron                            insufficiency based on transsaturation) with Rx                            sent to patient's pharmacy.                           -  The findings and recommendations were discussed                            with the patient.                           - The findings and recommendations were discussed                            with the designated responsible adult.                            - Return to GI clinic as previously scheduled. Justice Britain, MD 11/17/2017 8:20:35 AM

## 2017-11-17 NOTE — Progress Notes (Signed)
Called to room to assist during endoscopic procedure.  Patient ID and intended procedure confirmed with present staff. Received instructions for my participation in the procedure from the performing physician.  

## 2017-11-22 ENCOUNTER — Telehealth: Payer: Self-pay

## 2017-11-22 ENCOUNTER — Telehealth: Payer: Self-pay | Admitting: *Deleted

## 2017-11-22 LAB — GASTRIN: Gastrin: 226 pg/mL — ABNORMAL HIGH (ref <=100)

## 2017-11-22 NOTE — Telephone Encounter (Signed)
Called [phone] and unable to leave a message we tried to reach pt for a follow up call. maw

## 2017-11-22 NOTE — Telephone Encounter (Signed)
-----   Message from Irving Copas., MD sent at 11/22/2017  1:41 PM EDT ----- Regarding: Question for patient Leslie Davis, I hope you are well. Can you reach out to patient and just ensure that she got the gastrin level obtained prior to initiating or taking any PPI? After we know this, we can decided what we need to do next. Please let me know when you can. Thanks. Gabe ----- Message ----- From: Interface, Lab In Three Zero One Sent: 11/17/2017  10:28 AM EDT To: Irving Copas., MD

## 2017-11-22 NOTE — Telephone Encounter (Signed)
Called [phone] and left a messaged we tried to reach pt for a follow up call. Recording stated, "The person you are trying to reach is not excepting calls at this time". maw

## 2017-11-22 NOTE — Telephone Encounter (Signed)
Thanks for update

## 2017-11-22 NOTE — Telephone Encounter (Signed)
  Follow up Call-  Call back number 11/17/2017  Post procedure Call Back phone  # [phone]  Permission to leave phone message Yes  Some recent data might be hidden     Patient questions:  Do you have a fever, pain , or abdominal swelling? No. Pain Score  0 *  Have you tolerated food without any problems? Yes.    Have you been able to return to your normal activities? Yes.    Do you have any questions about your discharge instructions: Diet   Yes.   Medications  No. Follow up visit  No.  Do you have questions or concerns about your Care? No.  Actions: * If pain score is 4 or above: No action needed, pain <4.

## 2017-11-22 NOTE — Telephone Encounter (Signed)
Left message on machine to call back  

## 2017-11-22 NOTE — Telephone Encounter (Signed)
FYI Dr Rush Landmark the pt states she did have her lab work prior to starting her PPI.

## 2017-11-23 ENCOUNTER — Encounter: Payer: Self-pay | Admitting: Gastroenterology

## 2017-11-26 ENCOUNTER — Encounter: Payer: Self-pay | Admitting: Gastroenterology

## 2017-11-29 LAB — HCV GENOTYPE REFLEX NS3/4A

## 2017-11-29 LAB — REFLEX TEST INFORMATION

## 2017-11-30 ENCOUNTER — Other Ambulatory Visit: Payer: Self-pay | Admitting: Gastroenterology

## 2017-11-30 DIAGNOSIS — N08 Glomerular disorders in diseases classified elsewhere: Secondary | ICD-10-CM

## 2017-11-30 DIAGNOSIS — E1122 Type 2 diabetes mellitus with diabetic chronic kidney disease: Secondary | ICD-10-CM

## 2017-11-30 DIAGNOSIS — Z23 Encounter for immunization: Secondary | ICD-10-CM

## 2017-11-30 DIAGNOSIS — I129 Hypertensive chronic kidney disease with stage 1 through stage 4 chronic kidney disease, or unspecified chronic kidney disease: Secondary | ICD-10-CM

## 2017-11-30 DIAGNOSIS — K760 Fatty (change of) liver, not elsewhere classified: Secondary | ICD-10-CM

## 2017-11-30 DIAGNOSIS — N182 Chronic kidney disease, stage 2 (mild): Secondary | ICD-10-CM

## 2017-11-30 DIAGNOSIS — K2960 Other gastritis without bleeding: Secondary | ICD-10-CM

## 2017-11-30 LAB — HEPATIC FUNCTION PANEL
ALT: 18 (ref 7–35)
AST: 23 (ref 13–35)
Alkaline Phosphatase: 99 (ref 25–125)

## 2017-11-30 LAB — BASIC METABOLIC PANEL
BUN: 17 (ref 4–21)
Creatinine: 1.2 — AB (ref 0.5–1.1)
Glucose: 70
Potassium: 4.7 (ref 3.4–5.3)
Sodium: 142 (ref 137–147)

## 2017-11-30 LAB — LIPID PANEL
Cholesterol: 155 (ref 0–200)
HDL: 77 — AB (ref 35–70)
LDL Cholesterol: 58
LDl/HDL Ratio: 0.8
Triglycerides: 102 (ref 40–160)

## 2017-11-30 LAB — HEMOGLOBIN A1C: Hgb A1c MFr Bld: 6.8 — AB (ref 4.0–6.0)

## 2017-12-01 ENCOUNTER — Telehealth: Payer: Self-pay | Admitting: Gastroenterology

## 2017-12-01 NOTE — Telephone Encounter (Signed)
For now based on labs and her pathology findings of erosions and inflammation, I would maintain current PPI dosing. No additional laboratory testing other than Hepatitis C Antibody need to be completed. She can follow up in clinic later this month after 6-8 weeks of being on PPI daily. Thank you for update.

## 2017-12-01 NOTE — Telephone Encounter (Signed)
Dr Rush Landmark the pt is asking what the next step is, she had her labs on 8/29.  See note from 9/3 below:   at 11/22/2017  1:41 PM EDT ----- Regarding: Question for patient Bl[place]ard [place], I hope you are well. Can you reach out to patient and just ensure that she got the gastrin level obtained prior to initiating or taking any PPI? After we know this, we can decided what we need to do next. Please let me know when you can. Thanks. Gabe ----- Message ----- From: Interface, Lab In Three Zero One Sent: 11/17/2017  10:28 AM EDT To: Irving Copas., MD  Note    FYI Dr Rush Landmark the pt states she did have her lab work prior to starting her PPI.

## 2017-12-02 NOTE — Telephone Encounter (Signed)
Left message for patient to call back  

## 2017-12-05 NOTE — Telephone Encounter (Signed)
Left message on machine to call back  

## 2017-12-06 ENCOUNTER — Other Ambulatory Visit: Payer: BLUE CROSS/BLUE SHIELD

## 2017-12-06 DIAGNOSIS — K2960 Other gastritis without bleeding: Secondary | ICD-10-CM

## 2017-12-06 DIAGNOSIS — K760 Fatty (change of) liver, not elsewhere classified: Secondary | ICD-10-CM

## 2017-12-06 NOTE — Telephone Encounter (Signed)
The pt has been advised of the information and verbalized understanding.     She will come in for lab and keep f/u appt as scheduled.

## 2017-12-08 LAB — HEPATITIS C ANTIBODY
Hepatitis C Ab: NONREACTIVE
SIGNAL TO CUT-OFF: 0.04 (ref <1.00)

## 2017-12-18 ENCOUNTER — Encounter: Payer: Self-pay | Admitting: Internal Medicine

## 2017-12-27 ENCOUNTER — Other Ambulatory Visit: Payer: BLUE CROSS/BLUE SHIELD

## 2017-12-27 ENCOUNTER — Other Ambulatory Visit: Payer: Self-pay

## 2017-12-27 DIAGNOSIS — N19 Unspecified kidney failure: Secondary | ICD-10-CM

## 2017-12-28 LAB — BASIC METABOLIC PANEL
BUN/Creatinine Ratio: 11 — ABNORMAL LOW (ref 12–28)
BUN: 10 mg/dL (ref 8–27)
CO2: 23 mmol/L (ref 20–29)
Calcium: 9.8 mg/dL (ref 8.7–10.3)
Chloride: 104 mmol/L (ref 96–106)
Creatinine, Ser: 0.9 mg/dL (ref 0.57–1.00)
GFR calc Af Amer: 78 mL/min/{1.73_m2} (ref >59)
GFR calc non Af Amer: 68 mL/min/{1.73_m2} (ref >59)
Glucose: 132 mg/dL — ABNORMAL HIGH (ref 65–99)
Potassium: 4.7 mmol/L (ref 3.5–5.2)
Sodium: 141 mmol/L (ref 134–144)

## 2017-12-28 NOTE — Progress Notes (Signed)
Your kidney function is stable.

## 2017-12-30 ENCOUNTER — Other Ambulatory Visit: Payer: Self-pay | Admitting: Internal Medicine

## 2018-01-18 ENCOUNTER — Ambulatory Visit: Payer: BLUE CROSS/BLUE SHIELD | Admitting: Gastroenterology

## 2018-01-26 ENCOUNTER — Other Ambulatory Visit: Payer: Self-pay | Admitting: Internal Medicine

## 2018-02-09 ENCOUNTER — Ambulatory Visit: Payer: BLUE CROSS/BLUE SHIELD | Admitting: Gastroenterology

## 2018-02-11 ENCOUNTER — Other Ambulatory Visit: Payer: Self-pay | Admitting: Internal Medicine

## 2018-02-13 ENCOUNTER — Other Ambulatory Visit: Payer: Self-pay

## 2018-02-13 MED ORDER — EDARBI 80 MG PO TABS: 80.0000 mg | ORAL_TABLET | Freq: Every day | ORAL | 1 refills | Status: DC

## 2018-02-13 MED ORDER — ROSUVASTATIN CALCIUM 20 MG PO TABS: 20.0000 mg | ORAL_TABLET | Freq: Every day | ORAL | 1 refills | Status: DC

## 2018-02-23 ENCOUNTER — Other Ambulatory Visit: Payer: Self-pay

## 2018-02-23 MED ORDER — SEMAGLUTIDE(0.25 OR 0.5MG/DOS) 2 MG/1.5ML ~~LOC~~ SOPN: 0.5000 mg | PEN_INJECTOR | SUBCUTANEOUS | 2 refills | Status: DC

## 2018-03-07 ENCOUNTER — Encounter: Payer: Self-pay | Admitting: Internal Medicine

## 2018-03-07 ENCOUNTER — Ambulatory Visit: Payer: BLUE CROSS/BLUE SHIELD | Admitting: Internal Medicine

## 2018-03-07 VITALS — BP 136/74 | HR 63 | Temp 98.2°F | Ht 62.75 in | Wt 239.8 lb

## 2018-03-07 DIAGNOSIS — Z794 Long term (current) use of insulin: Secondary | ICD-10-CM

## 2018-03-07 DIAGNOSIS — E1122 Type 2 diabetes mellitus with diabetic chronic kidney disease: Secondary | ICD-10-CM | POA: Diagnosis not present

## 2018-03-07 DIAGNOSIS — I129 Hypertensive chronic kidney disease with stage 1 through stage 4 chronic kidney disease, or unspecified chronic kidney disease: Secondary | ICD-10-CM

## 2018-03-07 DIAGNOSIS — Z6841 Body Mass Index (BMI) 40.0 and over, adult: Secondary | ICD-10-CM

## 2018-03-07 DIAGNOSIS — N182 Chronic kidney disease, stage 2 (mild): Secondary | ICD-10-CM | POA: Diagnosis not present

## 2018-03-07 NOTE — Patient Instructions (Signed)

## 2018-03-08 LAB — CMP14+EGFR
ALT: 15 IU/L (ref 0–32)
AST: 20 IU/L (ref 0–40)
Albumin/Globulin Ratio: 1.4 (ref 1.2–2.2)
Albumin: 4.1 g/dL (ref 3.6–4.8)
Alkaline Phosphatase: 97 IU/L (ref 39–117)
BUN/Creatinine Ratio: 13 (ref 12–28)
BUN: 13 mg/dL (ref 8–27)
Bilirubin Total: 0.2 mg/dL (ref 0.0–1.2)
CO2: 24 mmol/L (ref 20–29)
Calcium: 9.7 mg/dL (ref 8.7–10.3)
Chloride: 102 mmol/L (ref 96–106)
Creatinine, Ser: 1 mg/dL (ref 0.57–1.00)
GFR calc Af Amer: 69 mL/min/{1.73_m2} (ref >59)
GFR calc non Af Amer: 60 mL/min/{1.73_m2} (ref >59)
Globulin, Total: 2.9 g/dL (ref 1.5–4.5)
Glucose: 82 mg/dL (ref 65–99)
Potassium: 4.5 mmol/L (ref 3.5–5.2)
Sodium: 141 mmol/L (ref 134–144)
Total Protein: 7 g/dL (ref 6.0–8.5)

## 2018-03-08 LAB — HEMOGLOBIN A1C
Est. average glucose Bld gHb Est-mCnc: 154 mg/dL
Hgb A1c MFr Bld: 7 % — ABNORMAL HIGH (ref 4.8–5.6)

## 2018-03-08 NOTE — Progress Notes (Signed)
Your liver and kidney function are normal. Your hba1c is 7.0.  This is creeping up a little bit, but still looks good. pls increase exercise as we discussed during your visit.

## 2018-03-20 ENCOUNTER — Encounter: Payer: Self-pay | Admitting: Internal Medicine

## 2018-03-20 NOTE — Progress Notes (Signed)
Subjective:     Patient ID: Leslie Davis , female    DOB: 1956/01/01 , 62 y.o.   MRN: [id number]   Chief Complaint  Patient presents with  . Diabetes  . Hypertension    HPI  Diabetes  She presents for her follow-up diabetic visit. She has type 2 diabetes mellitus. Her disease course has been stable. There are no hypoglycemic associated symptoms. Pertinent negatives for diabetes include no blurred vision and no chest pain. There are no hypoglycemic complications. Diabetic complications include nephropathy. Risk factors for coronary artery disease include diabetes mellitus, dyslipidemia, hypertension, obesity, post-menopausal and sedentary lifestyle.  Hypertension  This is a chronic problem. The current episode started more than 1 year ago. The problem has been gradually improving since onset. The problem is controlled. Pertinent negatives include no blurred vision, chest pain, palpitations or shortness of breath.   She reports compliance with meds.   Past Medical History:  Diagnosis Date  . DM (diabetes mellitus) ([place])   . Fatty liver disease, nonalcoholic   . Gastritis   . Gastroparesis   . GERD (gastroesophageal reflux disease)   . Hiatal hernia   . Hiatal hernia   . HLD (hyperlipidemia)   . HTN (hypertension)   . Renal calculus      Family History  Problem Relation Age of Onset  . Diabetes Mother   . Diabetes Maternal Grandmother   . Stomach cancer Maternal Aunt        X 2 aunts  . Hypertension Father   . Colon cancer Neg Hx   . Esophageal cancer Neg Hx   . Rectal cancer Neg Hx      Current Outpatient Medications:  .  amLODipine (NORVASC) 10 MG tablet, TAKE 1 TABLET ONCE DAILY., Disp: 90 tablet, Rfl: 0 .  aspirin EC 81 MG tablet, Take 81 mg by mouth daily., Disp: , Rfl:  .  EDARBI 80 MG TABS, Take 1 tablet (80 mg total) by mouth daily., Disp: 90 tablet, Rfl: 1 .  ferrous gluconate (FERGON) 324 MG tablet, Take 1 tablet (324 mg total) by mouth 2 (two) times  daily with a meal., Disp: 60 tablet, Rfl: 4 .  hydrochlorothiazide (HYDRODIURIL) 12.5 MG tablet, Take 1 tablet (12.5 mg total) by mouth 3 (three) times a week., Disp: 30 tablet, Rfl: 0 .  Insulin Glargine (TOUJEO MAX SOLOSTAR) 300 UNIT/ML SOPN, Inject 40 Units into the skin daily., Disp: , Rfl:  .  metFORMIN (GLUCOPHAGE) 850 MG tablet, Take 850 mg by mouth 2 (two) times daily with a meal., Disp: , Rfl:  .  pantoprazole (PROTONIX) 40 MG tablet, Take 1 tablet (40 mg total) by mouth daily., Disp: 90 tablet, Rfl: 3 .  rosuvastatin (CRESTOR) 20 MG tablet, Take 1 tablet (20 mg total) by mouth daily., Disp: 90 tablet, Rfl: 1 .  Semaglutide,0.25 or 0.'5MG'$ /DOS, (OZEMPIC, 0.25 OR 0.5 MG/DOSE,) 2 MG/1.5ML SOPN, Inject 0.5 mg into the skin once a week., Disp: 1 pen, Rfl: 2  Current Facility-Administered Medications:  .  dextrose 5 % solution, , Intravenous, Continuous, Mansouraty, Telford Nab., MD   No Known Allergies   Review of Systems  Constitutional: Negative.   Eyes: Negative for blurred vision.  Respiratory: Negative.  Negative for shortness of breath.   Cardiovascular: Negative.  Negative for chest pain and palpitations.  Gastrointestinal: Negative.   Neurological: Negative.   Psychiatric/Behavioral: Negative.      Today's Vitals   03/07/18 0902  BP: 136/74  Pulse: 63  Temp:  98.2 F (36.8 C)  TempSrc: Oral  Weight: 239 lb 12.8 oz (108.8 kg)  Height: 5' 2.75" (1.594 m)   Body mass index is 42.82 kg/m.   Objective:  Physical Exam Vitals signs and nursing note reviewed.  Constitutional:      Appearance: Normal appearance. She is obese.  HENT:     Head: Normocephalic and atraumatic.  Neck:     Musculoskeletal: Normal range of motion.  Cardiovascular:     Rate and Rhythm: Normal rate and regular rhythm.  Pulmonary:     Effort: Pulmonary effort is normal.     Breath sounds: Normal breath sounds.  Skin:    General: Skin is warm.  Neurological:     General: No focal deficit  present.     Mental Status: She is alert.  Psychiatric:        Mood and Affect: Mood normal.         Assessment And Plan:     1. Type 2 diabetes mellitus with stage 2 chronic kidney disease, with long-term current use of insulin ([place])  I will check labs as listed below. Importance of regular exercise was discussed with the patient. She is encouraged to engage in exercise 30 minutes five days weekly.   - CMP14+EGFR - Hemoglobin A1c  2. Chronic renal disease, stage II  Chronic. I will check a GFR, Cr today. She is encouraged to stay well hydrated.   3. Hypertensive nephropathy  Fair control. She was advised of bp goal less than 130/80. She is encouraged to avoid adding salt to her foods.   4. Class 3 severe obesity due to excess calories with serious comorbidity and body mass index (BMI) of 40.0 to 44.9 in adult [facility])  She is encouraged to strive for BMI less than 35 to decrease cardiac risk. She is advised to avoid sugary beverages and processed foods including deli meats.    [name], MD

## 2018-05-10 ENCOUNTER — Other Ambulatory Visit: Payer: Self-pay | Admitting: Internal Medicine

## 2018-05-11 ENCOUNTER — Other Ambulatory Visit: Payer: Self-pay | Admitting: Internal Medicine

## 2018-05-17 ENCOUNTER — Other Ambulatory Visit: Payer: Self-pay | Admitting: Internal Medicine

## 2018-05-17 DIAGNOSIS — I10 Essential (primary) hypertension: Secondary | ICD-10-CM

## 2018-05-17 DIAGNOSIS — G4700 Insomnia, unspecified: Secondary | ICD-10-CM

## 2018-05-17 MED ORDER — TEMAZEPAM 30 MG PO CAPS: ORAL_CAPSULE | ORAL | 2 refills | Status: DC

## 2018-05-17 MED ORDER — HYDROCHLOROTHIAZIDE 12.5 MG PO TABS: 12.5000 mg | ORAL_TABLET | Freq: Every day | ORAL | 1 refills | Status: DC

## 2018-05-19 ENCOUNTER — Other Ambulatory Visit: Payer: Self-pay | Admitting: Internal Medicine

## 2018-05-19 DIAGNOSIS — G4700 Insomnia, unspecified: Secondary | ICD-10-CM

## 2018-05-19 NOTE — Telephone Encounter (Signed)
Temazepam refill 

## 2018-05-26 ENCOUNTER — Other Ambulatory Visit: Payer: Self-pay | Admitting: Internal Medicine

## 2018-05-26 ENCOUNTER — Other Ambulatory Visit: Payer: Self-pay

## 2018-06-06 ENCOUNTER — Ambulatory Visit: Payer: BLUE CROSS/BLUE SHIELD | Admitting: Internal Medicine

## 2018-06-13 ENCOUNTER — Other Ambulatory Visit: Payer: Self-pay

## 2018-06-13 ENCOUNTER — Ambulatory Visit: Payer: BLUE CROSS/BLUE SHIELD | Admitting: Internal Medicine

## 2018-06-13 ENCOUNTER — Encounter: Payer: Self-pay | Admitting: Internal Medicine

## 2018-06-13 VITALS — BP 132/84 | HR 57 | Temp 97.4°F | Ht 62.75 in | Wt 238.4 lb

## 2018-06-13 DIAGNOSIS — E1122 Type 2 diabetes mellitus with diabetic chronic kidney disease: Secondary | ICD-10-CM

## 2018-06-13 DIAGNOSIS — I129 Hypertensive chronic kidney disease with stage 1 through stage 4 chronic kidney disease, or unspecified chronic kidney disease: Secondary | ICD-10-CM

## 2018-06-13 DIAGNOSIS — Z6841 Body Mass Index (BMI) 40.0 and over, adult: Secondary | ICD-10-CM

## 2018-06-13 DIAGNOSIS — N182 Chronic kidney disease, stage 2 (mild): Secondary | ICD-10-CM

## 2018-06-13 DIAGNOSIS — E66813 Obesity, class 3: Secondary | ICD-10-CM

## 2018-06-13 DIAGNOSIS — Z794 Long term (current) use of insulin: Secondary | ICD-10-CM

## 2018-06-14 LAB — LIPID PANEL
Chol/HDL Ratio: 2.2 ratio (ref 0.0–4.4)
Cholesterol, Total: 168 mg/dL (ref 100–199)
HDL: 78 mg/dL (ref >39)
LDL Calculated: 77 mg/dL (ref 0–99)
Triglycerides: 66 mg/dL (ref 0–149)
VLDL Cholesterol Cal: 13 mg/dL (ref 5–40)

## 2018-06-14 LAB — CMP14+EGFR
ALT: 19 IU/L (ref 0–32)
AST: 22 IU/L (ref 0–40)
Albumin/Globulin Ratio: 1.5 (ref 1.2–2.2)
Albumin: 4.3 g/dL (ref 3.8–4.8)
Alkaline Phosphatase: 103 IU/L (ref 39–117)
BUN/Creatinine Ratio: 16 (ref 12–28)
BUN: 15 mg/dL (ref 8–27)
Bilirubin Total: <0.2 mg/dL (ref 0.0–1.2)
CO2: 25 mmol/L (ref 20–29)
Calcium: 10 mg/dL (ref 8.7–10.3)
Chloride: 102 mmol/L (ref 96–106)
Creatinine, Ser: 0.96 mg/dL (ref 0.57–1.00)
GFR calc Af Amer: 72 mL/min/{1.73_m2} (ref >59)
GFR calc non Af Amer: 63 mL/min/{1.73_m2} (ref >59)
Globulin, Total: 2.8 g/dL (ref 1.5–4.5)
Glucose: 78 mg/dL (ref 65–99)
Potassium: 5.1 mmol/L (ref 3.5–5.2)
Sodium: 141 mmol/L (ref 134–144)
Total Protein: 7.1 g/dL (ref 6.0–8.5)

## 2018-06-14 LAB — HEMOGLOBIN A1C
Est. average glucose Bld gHb Est-mCnc: 143 mg/dL
Hgb A1c MFr Bld: 6.6 % — ABNORMAL HIGH (ref 4.8–5.6)

## 2018-06-15 NOTE — Progress Notes (Signed)
Subjective:     Patient ID: Leslie Davis , female    DOB: Dec 10, 1955 , 63 y.o.   MRN: [id number]   Chief Complaint  Patient presents with  . Diabetes  . Hypertension    HPI  Diabetes  She presents for her follow-up diabetic visit. She has type 2 diabetes mellitus. Her disease course has been stable. There are no hypoglycemic associated symptoms. Pertinent negatives for diabetes include no blurred vision and no chest pain. There are no hypoglycemic complications. Diabetic complications include nephropathy. Risk factors for coronary artery disease include diabetes mellitus, dyslipidemia, hypertension and post-menopausal.  Hypertension  This is a chronic problem. The current episode started more than 1 year ago. The problem has been gradually improving since onset. Pertinent negatives include no blurred vision, chest pain, palpitations or shortness of breath.     Past Medical History:  Diagnosis Date  . DM (diabetes mellitus) ([place])   . Fatty liver disease, nonalcoholic   . Gastritis   . Gastroparesis   . GERD (gastroesophageal reflux disease)   . Hiatal hernia   . Hiatal hernia   . HLD (hyperlipidemia)   . HTN (hypertension)   . Renal calculus      Family History  Problem Relation Age of Onset  . Diabetes Mother   . Diabetes Maternal Grandmother   . Stomach cancer Maternal Aunt        X 2 aunts  . Hypertension Father   . Colon cancer Neg Hx   . Esophageal cancer Neg Hx   . Rectal cancer Neg Hx      Current Outpatient Medications:  .  amLODipine (NORVASC) 10 MG tablet, TAKE 1 TABLET ONCE DAILY., Disp: 90 tablet, Rfl: 0 .  aspirin EC 81 MG tablet, Take 81 mg by mouth daily., Disp: , Rfl:  .  EDARBI 80 MG TABS, Take 1 tablet (80 mg total) by mouth daily., Disp: 90 tablet, Rfl: 1 .  ferrous gluconate (FERGON) 324 MG tablet, Take 1 tablet (324 mg total) by mouth 2 (two) times daily with a meal., Disp: 60 tablet, Rfl: 4 .  hydrochlorothiazide (HYDRODIURIL) 12.5 MG  tablet, Take 1 tablet (12.5 mg total) by mouth daily., Disp: 90 tablet, Rfl: 1 .  Insulin Glargine (TOUJEO MAX SOLOSTAR) 300 UNIT/ML SOPN, Inject 32 Units into the skin daily. , Disp: , Rfl:  .  meloxicam (MOBIC) 15 MG tablet, Take by mouth., Disp: , Rfl:  .  pantoprazole (PROTONIX) 40 MG tablet, Take 1 tablet (40 mg total) by mouth daily., Disp: 90 tablet, Rfl: 3 .  rosuvastatin (CRESTOR) 20 MG tablet, Take 1 tablet (20 mg total) by mouth daily., Disp: 90 tablet, Rfl: 1 .  Semaglutide,0.25 or 0.'5MG'$ /DOS, (OZEMPIC, 0.25 OR 0.5 MG/DOSE,) 2 MG/1.5ML SOPN, Inject 0.5 mg into the skin once a week., Disp: 1 pen, Rfl: 2 .  temazepam (RESTORIL) 30 MG capsule, TAKE 1 CAPSULE AT BEDTIME AS NEEDED., Disp: 30 capsule, Rfl: 1 .  pioglitazone-metformin (ACTOPLUS MET) 15-850 MG tablet, , Disp: , Rfl: 0   No Known Allergies   Review of Systems  Constitutional: Negative.   Eyes: Negative for blurred vision.  Respiratory: Negative.  Negative for shortness of breath.   Cardiovascular: Negative.  Negative for chest pain and palpitations.  Gastrointestinal: Negative.   Neurological: Negative.   Psychiatric/Behavioral: Negative.      Today's Vitals   06/13/18 1122  BP: 132/84  Pulse: (!) 57  Temp: (!) 97.4 F (36.3 C)  TempSrc: Oral  Weight: 238 lb 6.4 oz (108.1 kg)  Height: 5' 2.75" (1.594 m)   Body mass index is 42.57 kg/m.   Objective:  Physical Exam Vitals signs and nursing note reviewed.  Constitutional:      Appearance: Normal appearance.  HENT:     Head: Normocephalic and atraumatic.  Cardiovascular:     Rate and Rhythm: Normal rate and regular rhythm.     Heart sounds: Normal heart sounds.  Pulmonary:     Effort: Pulmonary effort is normal.     Breath sounds: Normal breath sounds.  Skin:    General: Skin is warm.  Neurological:     General: No focal deficit present.     Mental Status: She is alert.  Psychiatric:        Mood and Affect: Mood normal.        Behavior: Behavior  normal.         Assessment And Plan:     1. Type 2 diabetes mellitus with stage 2 chronic kidney disease, with long-term current use of insulin ([place])  I will check labs as listed below. She is encouraged to avoid sugary beverages and processed foods. She is encouraged to keep upcoming eye exam and to check her feet regularly.   - CMP14+EGFR - Hemoglobin A1c - Lipid panel  2. Hypertensive nephropathy  Controlled.  She will continue with current meds. She is aware optimal bp is less than 130/80. Importance of salt restriction was discussed with the patient.   3. Class 3 severe obesity due to excess calories with serious comorbidity and body mass index (BMI) of 40.0 to 44.9 in adult [facility])  Importance of achieving optimal weight to decrease risk of cardiovascular disease and cancers was discussed with the patient in full detail. She is encouraged to start slowly - start with 10 minutes twice daily at least three to four days per week and to gradually build to 30 minutes five days weekly. She was given tips to incorporate more activity into her daily routine - take stairs when possible, park farther away from her job, grocery stores, etc.      [name], MD

## 2018-08-01 ENCOUNTER — Other Ambulatory Visit: Payer: Self-pay | Admitting: Internal Medicine

## 2018-08-01 DIAGNOSIS — G4700 Insomnia, unspecified: Secondary | ICD-10-CM

## 2018-08-02 NOTE — Telephone Encounter (Signed)
Temazepam refill 

## 2018-08-19 ENCOUNTER — Other Ambulatory Visit: Payer: Self-pay | Admitting: Internal Medicine

## 2018-08-21 ENCOUNTER — Other Ambulatory Visit: Payer: Self-pay

## 2018-08-21 NOTE — Telephone Encounter (Signed)
Refill sent.

## 2018-08-22 ENCOUNTER — Other Ambulatory Visit: Payer: Self-pay

## 2018-08-25 ENCOUNTER — Other Ambulatory Visit: Payer: Self-pay | Admitting: Internal Medicine

## 2018-09-04 ENCOUNTER — Other Ambulatory Visit: Payer: Self-pay

## 2018-09-04 ENCOUNTER — Encounter: Payer: BLUE CROSS/BLUE SHIELD | Admitting: Internal Medicine

## 2018-09-18 ENCOUNTER — Other Ambulatory Visit: Payer: Self-pay | Admitting: Internal Medicine

## 2018-09-18 DIAGNOSIS — Z1231 Encounter for screening mammogram for malignant neoplasm of breast: Secondary | ICD-10-CM

## 2018-09-25 ENCOUNTER — Other Ambulatory Visit: Payer: Self-pay | Admitting: Internal Medicine

## 2018-09-27 ENCOUNTER — Telehealth: Payer: Self-pay

## 2018-09-27 NOTE — Telephone Encounter (Signed)
The pt said that she is having knee pain and that the office isn't in network with her insurance.  The pt was given the info to the orthopedic urgent care at [facility].

## 2018-09-27 NOTE — Telephone Encounter (Signed)
I left the pt a message that I was retuning her call to schedule her an appt to be evaluated for the knee pain.

## 2018-10-04 ENCOUNTER — Other Ambulatory Visit: Payer: Self-pay | Admitting: Internal Medicine

## 2018-10-04 ENCOUNTER — Other Ambulatory Visit: Payer: Self-pay

## 2018-10-04 DIAGNOSIS — N182 Chronic kidney disease, stage 2 (mild): Secondary | ICD-10-CM

## 2018-10-04 DIAGNOSIS — E1122 Type 2 diabetes mellitus with diabetic chronic kidney disease: Secondary | ICD-10-CM

## 2018-10-04 MED ORDER — OZEMPIC (0.25 OR 0.5 MG/DOSE) 2 MG/1.5ML ~~LOC~~ SOPN: 0.5000 mg | PEN_INJECTOR | SUBCUTANEOUS | 1 refills | Status: DC

## 2018-10-06 ENCOUNTER — Ambulatory Visit: Payer: Self-pay

## 2018-10-06 DIAGNOSIS — N182 Chronic kidney disease, stage 2 (mild): Secondary | ICD-10-CM

## 2018-10-06 DIAGNOSIS — I129 Hypertensive chronic kidney disease with stage 1 through stage 4 chronic kidney disease, or unspecified chronic kidney disease: Secondary | ICD-10-CM

## 2018-10-06 DIAGNOSIS — E1122 Type 2 diabetes mellitus with diabetic chronic kidney disease: Secondary | ICD-10-CM

## 2018-10-06 NOTE — Chronic Care Management (AMB) (Signed)
   Care Management   Outreach Note  10/06/2018 Name: CHERYAL SALAS MRN: [id number] DOB: 1955/07/22  Referred by: [name], MD Reason for referral : Care Coordination   An unsuccessful telephone outreach was attempted today. The patient was referred to the case management team by for assistance with chronic care management and care coordination.   Follow Up Plan: A HIPPA compliant phone message was left for the patient providing contact information and requesting a return call.  The care management team will reach out to the patient again over the next 10 days.   Daneen Schick, BSW, CDP Social Worker, Certified Dementia Practitioner [facility] [phone]

## 2018-10-11 ENCOUNTER — Emergency Department (HOSPITAL_COMMUNITY): Payer: BLUE CROSS/BLUE SHIELD

## 2018-10-11 ENCOUNTER — Other Ambulatory Visit: Payer: Self-pay

## 2018-10-11 ENCOUNTER — Encounter (HOSPITAL_COMMUNITY): Payer: Self-pay | Admitting: Obstetrics and Gynecology

## 2018-10-11 ENCOUNTER — Emergency Department (HOSPITAL_COMMUNITY)
Admission: EM | Admit: 2018-10-11 | Discharge: 2018-10-11 | Disposition: A | Discharge status: Home / Self Care | Payer: BLUE CROSS/BLUE SHIELD | Attending: Emergency Medicine | Admitting: Emergency Medicine

## 2018-10-11 DIAGNOSIS — R079 Chest pain, unspecified: Secondary | ICD-10-CM

## 2018-10-11 DIAGNOSIS — R0789 Other chest pain: Secondary | ICD-10-CM | POA: Diagnosis present

## 2018-10-11 DIAGNOSIS — I10 Essential (primary) hypertension: Secondary | ICD-10-CM | POA: Insufficient documentation

## 2018-10-11 DIAGNOSIS — R072 Precordial pain: Secondary | ICD-10-CM | POA: Insufficient documentation

## 2018-10-11 DIAGNOSIS — K219 Gastro-esophageal reflux disease without esophagitis: Secondary | ICD-10-CM | POA: Diagnosis not present

## 2018-10-11 DIAGNOSIS — Z794 Long term (current) use of insulin: Secondary | ICD-10-CM | POA: Insufficient documentation

## 2018-10-11 DIAGNOSIS — Z87891 Personal history of nicotine dependence: Secondary | ICD-10-CM | POA: Diagnosis not present

## 2018-10-11 DIAGNOSIS — Z79899 Other long term (current) drug therapy: Secondary | ICD-10-CM | POA: Diagnosis not present

## 2018-10-11 DIAGNOSIS — Z7982 Long term (current) use of aspirin: Secondary | ICD-10-CM | POA: Diagnosis not present

## 2018-10-11 DIAGNOSIS — E119 Type 2 diabetes mellitus without complications: Secondary | ICD-10-CM | POA: Diagnosis not present

## 2018-10-11 LAB — CBC
HCT: 40.4 % (ref 36.0–46.0)
Hemoglobin: 12.8 g/dL (ref 12.0–15.0)
MCH: 28.3 pg (ref 26.0–34.0)
MCHC: 31.7 g/dL (ref 30.0–36.0)
MCV: 89.4 fL (ref 80.0–100.0)
Platelets: 272 10*3/uL (ref 150–400)
RBC: 4.52 MIL/uL (ref 3.87–5.11)
RDW: 15.7 % — ABNORMAL HIGH (ref 11.5–15.5)
WBC: 6.1 10*3/uL (ref 4.0–10.5)
nRBC: 0 % (ref 0.0–0.2)

## 2018-10-11 LAB — BASIC METABOLIC PANEL
Anion gap: 11 (ref 5–15)
BUN: 22 mg/dL (ref 8–23)
CO2: 25 mmol/L (ref 22–32)
Calcium: 9.8 mg/dL (ref 8.9–10.3)
Chloride: 103 mmol/L (ref 98–111)
Creatinine, Ser: 1.02 mg/dL — ABNORMAL HIGH (ref 0.44–1.00)
GFR calc Af Amer: >60 mL/min (ref >60)
GFR calc non Af Amer: 58 mL/min — ABNORMAL LOW (ref >60)
Glucose, Bld: 101 mg/dL — ABNORMAL HIGH (ref 70–99)
Potassium: 4 mmol/L (ref 3.5–5.1)
Sodium: 139 mmol/L (ref 135–145)

## 2018-10-11 LAB — TROPONIN I (HIGH SENSITIVITY)
Troponin I (High Sensitivity): 27 ng/L — ABNORMAL HIGH (ref <18)
Troponin I (High Sensitivity): 22 ng/L — ABNORMAL HIGH (ref <18)

## 2018-10-11 MED ORDER — ASPIRIN 81 MG PO CHEW
324.0000 mg | CHEWABLE_TABLET | Freq: Once | ORAL | Status: AC
  Administered 2018-10-11: 324 mg via ORAL
  Filled 2018-10-11: qty 4

## 2018-10-11 MED ORDER — LIDOCAINE 5 % EX PTCH
1.0000 | MEDICATED_PATCH | CUTANEOUS | Status: DC
  Administered 2018-10-11: 1 via TRANSDERMAL
  Filled 2018-10-11: qty 1

## 2018-10-11 MED ORDER — HYDROGEN PEROXIDE 3 % EX SOLN
CUTANEOUS | Status: AC
  Administered 2018-10-11: 1
  Filled 2018-10-11: qty 473

## 2018-10-11 MED ORDER — NITROGLYCERIN 2 % TD OINT
1.0000 [in_us] | TOPICAL_OINTMENT | Freq: Once | TRANSDERMAL | Status: AC
  Administered 2018-10-11: 1 [in_us] via TOPICAL
  Filled 2018-10-11: qty 1

## 2018-10-11 MED ORDER — AMLODIPINE BESYLATE 5 MG PO TABS
10.0000 mg | ORAL_TABLET | Freq: Once | ORAL | Status: AC
  Administered 2018-10-11: 10 mg via ORAL
  Filled 2018-10-11: qty 2

## 2018-10-11 MED ORDER — NITROGLYCERIN 0.4 MG SL SUBL: 0.4000 mg | SUBLINGUAL_TABLET | SUBLINGUAL | Status: DC | PRN

## 2018-10-11 MED ORDER — ACETAMINOPHEN 500 MG PO TABS
1000.0000 mg | ORAL_TABLET | Freq: Once | ORAL | Status: AC
  Administered 2018-10-11: 1000 mg via ORAL
  Filled 2018-10-11: qty 2

## 2018-10-11 MED ORDER — HYDROCHLOROTHIAZIDE 12.5 MG PO CAPS
25.0000 mg | ORAL_CAPSULE | Freq: Once | ORAL | Status: AC
  Administered 2018-10-11: 25 mg via ORAL
  Filled 2018-10-11: qty 2

## 2018-10-11 NOTE — ED Triage Notes (Signed)
Pt reports chest pain that started at 0600 this morning as well as SOB. Pt denies N/V/D. Pt reports left sided chest pain

## 2018-10-11 NOTE — ED Notes (Signed)
Patient given discharge teaching and verbalized understanding. Patient ambulated out of ED with a steady gait. 

## 2018-10-11 NOTE — Discharge Instructions (Signed)
Get help right away if: Your chest pain is worse. You have a cough that gets worse, or you cough up blood. You have very bad (severe) pain in your belly (abdomen). You pass out (faint). You have either of these for no clear reason: Sudden chest discomfort. Sudden discomfort in your arms, back, neck, or jaw. You have shortness of breath at any time. You suddenly start to sweat, or your skin gets clammy. You feel sick to your stomach (nauseous). You throw up (vomit). You suddenly feel lightheaded or dizzy. You feel very weak or tired. Your heart starts to beat fast, or it feels like it is skipping beats. These symptoms may be an emergency. Do not wait to see if the symptoms will go away. Get medical help right away. Call your local emergency services (911 in the [place] Do not drive yourself to the hospital.

## 2018-10-11 NOTE — ED Provider Notes (Signed)
[facility] DEPT Provider Note   CSN: [id number] Arrival date & time: 10/11/18  1124    History   Chief Complaint Chief Complaint  Patient presents with  . Chest Pain    HPI Leslie Davis is a 63 y.o. female history DM on insulin, HTN, HLD, GERD here for evaluation of chest pain. Onset at 0900 this morning while she was sitting up/getting out of bed. Described as "achy" and "soreness", 5/10, localized to left upper chest. Constant since onset. Slightly worse with moving it "can feel it more".  No alleviating factors. Non exertional, non pleuritic, non radiating.  A couple of years ago she had similar CP and came to ER for evaluation and was told everything was normal and discharged.  States 3 hours before chest pain and around 0600 she woke up and had palpitations, felt sweaty, shortness of breath and "out of it" but she had no CP during this time.  These have since resolved. Eventually she fell back asleep and woke up at 9 with chest pain.  She has history of acid reflux but states this feels different and she has been compliant with her acid reducer medicine. No recent lifting or heavy activity to cause muscle soreness.  Her brother had history of heart disease at around age of 47.  She has no personal history of CAD.  She denies associated fever, cough, nausea, vomiting, neck pain or arm pain, leg swelling, calf pain, abdominal pain.  She denies LE edema, calf pain. No h/o DVT/PE and PERC negative.     HPI  Past Medical History:  Diagnosis Date  . DM (diabetes mellitus) ([place])   . Fatty liver disease, nonalcoholic   . Gastritis   . Gastroparesis   . GERD (gastroesophageal reflux disease)   . Hiatal hernia   . Hiatal hernia   . HLD (hyperlipidemia)   . HTN (hypertension)   . Renal calculus     Patient Active Problem List   Diagnosis Date Noted  . Erosive gastritis 11/16/2017  . Fatty liver 11/16/2017  . Abdominal pain 12/11/2012  . Early  satiety 12/11/2012  . Hiatal hernia   . Hx of cholecystectomy 02/05/2011  . Flu-like symptoms 02/05/2011  . CONSTIPATION 08/27/2008  . FATTY LIVER DISEASE 05/07/2008  . Nausea with vomiting 04/23/2008  . EPIGASTRIC PAIN 04/23/2008  . RENAL CALCULUS 04/22/2008  . DIABETES MELLITUS II, UNCOMPLICATED 95/28/4132  . HYPERLIPIDEMIA 05/19/2006  . HYPERTENSION, BENIGN SYSTEMIC 05/19/2006  . GASTROESOPHAGEAL REFLUX, NO ESOPHAGITIS 05/19/2006  . AMENORRHEA 05/19/2006    Past Surgical History:  Procedure Laterality Date  . CHOLECYSTECTOMY    . COLONOSCOPY    . TUBAL LIGATION    . UPPER GASTROINTESTINAL ENDOSCOPY       OB History   No obstetric history on file.      Home Medications    Prior to Admission medications   Medication Sig Start Date End Date Taking? Authorizing Provider  amLODipine (NORVASC) 10 MG tablet TAKE 1 TABLET ONCE DAILY. 08/28/18   [name], MD  aspirin EC 81 MG tablet Take 81 mg by mouth daily.    [provider]  EDARBI 80 MG TABS Take 1 tablet (80 mg total) by mouth daily. 02/13/18   [name], MD  ferrous gluconate (FERGON) 324 MG tablet Take 1 tablet (324 mg total) by mouth 2 (two) times daily with a meal. 11/17/17   Mansouraty, Telford Nab., MD  hydrochlorothiazide (HYDRODIURIL) 12.5 MG tablet Take 1 tablet (  12.5 mg total) by mouth daily. 05/17/18   [name], MD  HYDROcodone-homatropine [facility]) 5-1.5 MG/5ML syrup  04/17/18   [provider]  Insulin Glargine (TOUJEO MAX SOLOSTAR) 300 UNIT/ML SOPN Inject 32 Units into the skin daily.    [provider]  meloxicam (MOBIC) 15 MG tablet  09/21/18   [provider]  OZEMPIC, 1 MG/DOSE, 2 MG/1.5ML SOPN INJECT '1MG'$  ONCE A WEEK IN THE ABDOMEN, THIGHS OR UPPER ARM ROTATING INJECTION SITES. 10/04/18   [name], MD  pantoprazole (PROTONIX) 40 MG tablet Take 1 tablet (40 mg total) by mouth daily. 11/17/17   Mansouraty, Telford Nab., MD  pioglitazone-metformin (ACTOPLUS  MET) [id number] MG tablet  12/25/17   [provider]  rosuvastatin (CRESTOR) 20 MG tablet Take 1 tablet (20 mg total) by mouth daily. 02/13/18   [name], MD  Semaglutide,0.25 or 0.'5MG'$ /DOS, (OZEMPIC, 0.25 OR 0.5 MG/DOSE,) 2 MG/1.5ML SOPN Inject 0.5 mg into the skin once a week. 10/04/18   [name], MD  temazepam (RESTORIL) 30 MG capsule TAKE 1 CAPSULE AT BEDTIME AS NEEDED. 08/02/18   [name], MD  TOUJEO SOLOSTAR 300 UNIT/ML SOPN INJECT 32 UNITS SUBCUTANEOUSLY ONCE DAILY AS DIRECTED. 09/25/18   [name], MD    Family History Family History  Problem Relation Age of Onset  . Diabetes Mother   . Diabetes Maternal Grandmother   . Stomach cancer Maternal Aunt        X 2 aunts  . Hypertension Father   . Colon cancer Neg Hx   . Esophageal cancer Neg Hx   . Rectal cancer Neg Hx     Social History Social History   Tobacco Use  . Smoking status: Former Smoker    Packs/day: 0.25    Years: 1.00    Pack years: 0.25    Quit date: 09/10/1974    Years since quitting: 44.1  . Smokeless tobacco: Never Used  Substance Use Topics  . Alcohol use: No  . Drug use: No     Allergies   Patient has no known allergies.   Review of Systems Review of Systems  Constitutional: Positive for diaphoresis.  Respiratory: Positive for shortness of breath.   Cardiovascular: Positive for chest pain and palpitations.  All other systems reviewed and are negative.    Physical Exam Updated Vital Signs BP (!) 179/70   Pulse 67   Temp 97.8 F (36.6 C) (Oral)   Resp 14   SpO2 100%   Physical Exam Constitutional:      Appearance: She is well-developed.     Comments: NAD. Non toxic.   HENT:     Head: Normocephalic and atraumatic.     Nose: Nose normal.  Eyes:     General: Lids are normal.     Conjunctiva/sclera: Conjunctivae normal.  Neck:     Musculoskeletal: Normal range of motion.     Trachea: Trachea normal.     Comments: Trachea midline.  Cardiovascular:     Rate  and Rhythm: Normal rate and regular rhythm.     Pulses:          Radial pulses are 1+ on the right side and 1+ on the left side.       Dorsalis pedis pulses are 1+ on the right side and 1+ on the left side.     Heart sounds: Normal heart sounds, S1 normal and S2 normal.     Comments: No LE edema or calf tenderness.  Pulmonary:  Effort: Pulmonary effort is normal.     Breath sounds: Decreased breath sounds present.     Comments: Diminished lung sounds to lower lobes anteriorly, difficult exam due to body habitus.  Normal work of breathing.  Lungs clear otherwise. Chest:     Comments: No reproducible chest wall tenderness however patient reports more "soreness" with active range of motion of the upper extremities against resistance. Abdominal:     General: Bowel sounds are normal.     Palpations: Abdomen is soft.     Tenderness: There is no abdominal tenderness.     Comments: No epigastric tenderness. No distention.   Skin:    General: Skin is warm and dry.     Capillary Refill: Capillary refill takes less than 2 seconds.     Comments: No rash to chest wall  Neurological:     Mental Status: She is alert.     GCS: GCS eye subscore is 4. GCS verbal subscore is 5. GCS motor subscore is 6.  Psychiatric:        Speech: Speech normal.        Behavior: Behavior normal.        Thought Content: Thought content normal.      ED Treatments / Results  Labs (all labs ordered are listed, but only abnormal results are displayed) Labs Reviewed  BASIC METABOLIC PANEL - Abnormal; Notable for the following components:      Result Value   Glucose, Bld 101 (*)    Creatinine, Ser 1.02 (*)    GFR calc non Af Amer 58 (*)    All other components within normal limits  CBC - Abnormal; Notable for the following components:   RDW 15.7 (*)    All other components within normal limits  TROPONIN I (HIGH SENSITIVITY) - Abnormal; Notable for the following components:   Troponin I (High Sensitivity) 27  (*)    All other components within normal limits  TROPONIN I (HIGH SENSITIVITY)    EKG EKG Interpretation  Date/Time:  Wednesday October 11 2018 11:44:12 EDT Ventricular Rate:  70 PR Interval:  128 QRS Duration: 78 QT Interval:  420 QTC Calculation: 453 R Axis:   40 Text Interpretation:  Normal sinus rhythm Normal ECG No acute changes No significant change since last tracing Confirmed by Varney Biles [phone]) on 10/11/2018 2:50:00 PM   Radiology Dg Chest Port 1 View  Result Date: 10/11/2018 CLINICAL DATA:  Chest pain for several hours EXAM: PORTABLE CHEST 1 VIEW COMPARISON:  08/07/15 FINDINGS: The heart size and mediastinal contours are within normal limits. Both lungs are clear. The visualized skeletal structures are unremarkable. IMPRESSION: No active disease. Electronically Signed   By: Inez Catalina M.D.   On: 10/11/2018 15:13    Procedures Procedures (including critical care time)  Medications Ordered in ED Medications  lidocaine (LIDODERM) 5 % 1 patch (1 patch Transdermal Patch Applied 10/11/18 1427)  nitroGLYCERIN (NITROSTAT) SL tablet 0.4 mg (has no administration in time range)  aspirin chewable tablet 324 mg (324 mg Oral Given 10/11/18 1426)  acetaminophen (TYLENOL) tablet 1,000 mg (1,000 mg Oral Given 10/11/18 1427)  hydrogen peroxide 3 % external solution (1 application  Given 12/05/03 1436)  amLODipine (NORVASC) tablet 10 mg (10 mg Oral Given 10/11/18 1516)  hydrochlorothiazide (MICROZIDE) capsule 25 mg (25 mg Oral Given 10/11/18 1516)  nitroGLYCERIN (NITROGLYN) 2 % ointment 1 inch (1 inch Topical Given 10/11/18 1517)     Initial Impression / Assessment and Plan / ED Course  I have reviewed the triage vital signs and the nursing notes.  Pertinent labs & imaging results that were available during my care of the patient were reviewed by me and considered in my medical decision making (see chart for details).  63 year old presents with chest pain.  Chest pain has some  atypical components.  It has been constant since 9 AM, onset at rest, slightly worse with active range of motion of the upper extremities.  She has never had cardiac work-up.  No personal history of CAD.  Cardiac risk factors include elevated BMI, HTN, DM, HLD and positive family history.  She is hypertensive in setting of missing her meds today. CV and pulmonary exam otherwise benign. No LE edema or calf tenderness. No epigastric tenderness. No neuro or pulse deficits. No risk factors for PE/DVT, PE is likely. History and exam not c/w dissection, infectious process, PTX.   EKG non-ischemic.  Hs-trop 27, pending delta.   She was given nitro paste, and oral BP meds.   1535: Will hand pt off to oncoming EDPA at discharge pending delta troponin. Consider admission for NSTEMI/unstable angina based on delta troponin value. Monitor BP which could be contributing.   Final Clinical Impressions(s) / ED Diagnoses   Final diagnoses:  Chest pain in adult    ED Discharge Orders    None       Leslie Davis, Leslie Davis 10/11/18 [place], Ankit, MD 10/15/18 1450

## 2018-10-11 NOTE — ED Notes (Signed)
EKG handed to [facility]

## 2018-10-11 NOTE — ED Notes (Signed)
Patient was given soda and a [place] sandwich.

## 2018-10-11 NOTE — ED Provider Notes (Signed)
Care transferred from [place] PA-C at shift change.  See note for full HPI  In summation 63 year old female presents for evaluation of chest pain.  Onset at 9 AM this morning she was getting out of bed.  Has history of reflux however states this felt different.  No personal history of CAD.  No evidence of DVT/PE on exam.  She is PERC negative.  Heart score 3.  Initial troponin 27.  Plan on delta troponin and reassessment of blood pressure.  She has been hypertensive in department however did not take her home meds.  These were given to her.  Plan to DC home if repeat troponin less than 46 and improvement in blood pressure.  She can follow-up with cardiology outpatient.  Physical Exam  BP (!) 137/41   Pulse 73   Temp 97.8 F (36.6 C) (Oral)   Resp 15   SpO2 98%   Physical Exam Vitals signs and nursing note reviewed.  Constitutional:      General: She is not in acute distress.    Appearance: She is not ill-appearing, toxic-appearing or diaphoretic.  HENT:     Head: Normocephalic and atraumatic.     Nose:     Comments: Clear rhinorrhea and congestion to bilateral nares.  No sinus tenderness.    Mouth/Throat:     Comments:  No drooling, dysphasia or trismus.  Phonation normal. Neck:     Vascular: No carotid bruit or JVD.     Trachea: Trachea and phonation normal.  Cardiovascular:     Pulses: Normal pulses.     Heart sounds: Normal heart sounds.     Comments: No murmurs rubs or gallops. Pulmonary:     Effort: Pulmonary effort is normal.     Breath sounds: Normal breath sounds and air entry.     Comments: Clear to auscultation bilaterally without wheeze, rhonchi or rales.  No accessory muscle usage.  Able speak in full sentences. Abdominal:     Comments: Soft, nontender without rebound or guarding.  No CVA tenderness.  Musculoskeletal:     Comments: Moves all 4 extremities without difficulty.  Lower extremities without edema, erythema or warmth.  Skin:    Comments:  Brisk capillary refill.  Neurological:     Mental Status: She is alert.     Comments: Ambulatory in department without difficulty.  Cranial nerves II through XII grossly intact.  No facial droop.  No aphasia.    ED Course/Procedures   Clinical Course as of Oct 11 1807  Wed Oct 11, 2018  1519 EKG not crossing over, reviewed by me and EDP no ischemia, no acute changes   ED EKG [CG]    Clinical Course User Index [CG] Kinnie Feil, PA-C   Labs Reviewed  BASIC METABOLIC PANEL - Abnormal; Notable for the following components:      Result Value   Glucose, Bld 101 (*)    Creatinine, Ser 1.02 (*)    GFR calc non Af Amer 58 (*)    All other components within normal limits  CBC - Abnormal; Notable for the following components:   RDW 15.7 (*)    All other components within normal limits  TROPONIN I (HIGH SENSITIVITY) - Abnormal; Notable for the following components:   Troponin I (High Sensitivity) 27 (*)    All other components within normal limits  TROPONIN I (HIGH SENSITIVITY) - Abnormal; Notable for the following components:   Troponin I (High Sensitivity) 22 (*)    All  other components within normal limits  Dg Chest Port 1 View  Result Date: 10/11/2018 CLINICAL DATA:  Chest pain for several hours EXAM: PORTABLE CHEST 1 VIEW COMPARISON:  08/07/15 FINDINGS: The heart size and mediastinal contours are within normal limits. Both lungs are clear. The visualized skeletal structures are unremarkable. IMPRESSION: No active disease. Electronically Signed   By: Inez Catalina M.D.   On: 10/11/2018 15:13   Procedures  MDM  63 year old female presents for evaluation of chest pain.  Handed off from previous provider at shift change.  Patient with atypical chest pain.  No personal history of CAD and is not followed by cardiology.  She has some hypertension however has missed her home meds.  These were given to her in the emergency department with significant improvement in her blood pressure.  On  my evaluation she has no current chest pain.  She is tolerating p.o. without difficulty.  She is also ambulatory in the room without difficulty.  Heart and lung exam without any findings.  No evidence of DVT on exam.  EKG without ST/T changes.  No STEMI.  Plan to repeat troponin.  If no significant elevatation can DC home with cardiology follow-up outpatient. Initial troponin 27 however repeat at 22.  Heart score 3. PERC negative.  Low suspicion for ACS, dissection, PE, [place], pericarditis, myocarditis, pneumothorax or infectious process.  Patient is to be discharged with recommendation to follow up with PCP in regards to today's hospital visit. Chest pain is not likely of cardiac or pulmonary etiology d/t presentation, PERC negative, VSS, no tracheal deviation, no JVD or new murmur, RRR, breath sounds equal bilaterally, EKG without acute abnormalities, negative troponin, and negative CXR. Pt has been advised to return to the ED if CP becomes exertional, associated with diaphoresis or nausea, radiates to left jaw/arm, worsens or becomes concerning in any way. Pt appears reliable for follow up and is agreeable to discharge.           Thersa Mohiuddin A, PA-C 10/11/18 1809    Sherwood Gambler, MD 10/12/18 [phone]

## 2018-10-17 ENCOUNTER — Telehealth: Payer: Self-pay | Admitting: *Deleted

## 2018-10-17 NOTE — Telephone Encounter (Signed)
CALLED LEFT MESSAGE  TO CALL BACK   WOULD LIKE TO SWITCH APPT TO VIRTUAL IF POSSIBLE  - WILL NEED TO GIVEN INSTRUCTIONS -,   IF PATIENT WOULD LIKE TO COME INTO OFFICE - COVID QUESTION NEED  TO BE  ASKED

## 2018-10-18 ENCOUNTER — Ambulatory Visit: Payer: BLUE CROSS/BLUE SHIELD | Admitting: Internal Medicine

## 2018-10-18 ENCOUNTER — Ambulatory Visit: Payer: Self-pay

## 2018-10-18 DIAGNOSIS — I129 Hypertensive chronic kidney disease with stage 1 through stage 4 chronic kidney disease, or unspecified chronic kidney disease: Secondary | ICD-10-CM

## 2018-10-18 DIAGNOSIS — Z794 Long term (current) use of insulin: Secondary | ICD-10-CM

## 2018-10-18 DIAGNOSIS — E1122 Type 2 diabetes mellitus with diabetic chronic kidney disease: Secondary | ICD-10-CM

## 2018-10-18 DIAGNOSIS — N182 Chronic kidney disease, stage 2 (mild): Secondary | ICD-10-CM

## 2018-10-18 NOTE — Telephone Encounter (Signed)
Left voice mail for patient to contact the office to schedule a appointment.

## 2018-10-18 NOTE — Chronic Care Management (AMB) (Signed)
  Care Management Note   Leslie Davis is a 63 y.o. year old female who is a primary care patient of [name], MD . The CM team was consulted for assistance with medication assistance related to Diabetes management.   Review of patient status, including review of consultants reports, and collaboration with appropriate care team members and the patient's provider was performed as part of comprehensive patient evaluation and provision of care management services. Telephone outreach to patient today to introduce CM services.   I reached out to [employer] by phone today.   [name] was given information about Care Management services today including:  1. Care Management services includes personalized support from designated clinical staff supervised by her physician, including individualized plan of care and coordination with other care providers 2. 24/7 contact phone numbers for assistance for urgent and routine care needs. 3. The patient may stop case management services at any time by phone call to the office staff.   Patient agreed to services and verbal consent obtained. The patient does not identify any current SW needs at this time. CCM SW has collaborated with embedded PharmD as well as Chief Strategy Officer to communication patient enrollment status.   Follow Up Plan: The patient will be contacted by a CCM team member over the next 10-14 days.  Daneen Schick, BSW, CDP Social Worker, Certified Dementia Practitioner [facility] [phone]

## 2018-10-20 ENCOUNTER — Other Ambulatory Visit: Payer: Self-pay | Admitting: Internal Medicine

## 2018-10-23 ENCOUNTER — Other Ambulatory Visit: Payer: Self-pay | Admitting: Internal Medicine

## 2018-10-23 ENCOUNTER — Other Ambulatory Visit: Payer: Self-pay

## 2018-10-23 DIAGNOSIS — M1712 Unilateral primary osteoarthritis, left knee: Secondary | ICD-10-CM | POA: Diagnosis not present

## 2018-10-23 MED ORDER — METFORMIN HCL 850 MG PO TABS: 850.0000 mg | ORAL_TABLET | Freq: Two times a day (BID) | ORAL | 1 refills | Status: DC

## 2018-10-23 NOTE — Telephone Encounter (Signed)
The pt was notified that her insurance doesn't cover the pioglitazone-metformin anymore and Dr. Baird Cancer is sending metformin 850 mg for the pt to take 2 times per day.

## 2018-10-24 ENCOUNTER — Telehealth: Payer: Self-pay

## 2018-10-24 DIAGNOSIS — I498 Other specified cardiac arrhythmias: Secondary | ICD-10-CM | POA: Diagnosis not present

## 2018-10-24 DIAGNOSIS — Z87891 Personal history of nicotine dependence: Secondary | ICD-10-CM | POA: Diagnosis not present

## 2018-10-24 DIAGNOSIS — R079 Chest pain, unspecified: Secondary | ICD-10-CM | POA: Diagnosis not present

## 2018-10-24 DIAGNOSIS — R0600 Dyspnea, unspecified: Secondary | ICD-10-CM | POA: Diagnosis not present

## 2018-10-24 DIAGNOSIS — I10 Essential (primary) hypertension: Secondary | ICD-10-CM | POA: Diagnosis not present

## 2018-10-25 ENCOUNTER — Telehealth: Payer: Self-pay

## 2018-10-25 NOTE — Telephone Encounter (Signed)
I returned the pt's call and notified her that a copay savings card has been placed up front for the [employer] and that she will need to activate the savings card.

## 2018-10-31 ENCOUNTER — Other Ambulatory Visit: Payer: Self-pay | Admitting: Internal Medicine

## 2018-11-02 ENCOUNTER — Other Ambulatory Visit: Payer: Self-pay | Admitting: Internal Medicine

## 2018-11-02 ENCOUNTER — Other Ambulatory Visit: Payer: Self-pay

## 2018-11-02 ENCOUNTER — Ambulatory Visit
Admission: RE | Admit: 2018-11-02 | Discharge: 2018-11-02 | Disposition: A | Discharge status: Home / Self Care | Payer: Medicare HMO | Source: Ambulatory Visit | Attending: Internal Medicine | Admitting: Internal Medicine

## 2018-11-02 DIAGNOSIS — Z1231 Encounter for screening mammogram for malignant neoplasm of breast: Secondary | ICD-10-CM

## 2018-11-03 DIAGNOSIS — H40053 Ocular hypertension, bilateral: Secondary | ICD-10-CM | POA: Diagnosis not present

## 2018-11-03 DIAGNOSIS — E119 Type 2 diabetes mellitus without complications: Secondary | ICD-10-CM | POA: Diagnosis not present

## 2018-11-03 DIAGNOSIS — H2513 Age-related nuclear cataract, bilateral: Secondary | ICD-10-CM | POA: Diagnosis not present

## 2018-11-03 DIAGNOSIS — H524 Presbyopia: Secondary | ICD-10-CM | POA: Diagnosis not present

## 2018-11-03 DIAGNOSIS — I200 Unstable angina: Secondary | ICD-10-CM | POA: Diagnosis not present

## 2018-11-03 LAB — HM DIABETES EYE EXAM

## 2018-11-06 ENCOUNTER — Telehealth: Payer: Medicare HMO

## 2018-11-07 ENCOUNTER — Other Ambulatory Visit: Payer: Self-pay | Admitting: Gastroenterology

## 2018-11-07 ENCOUNTER — Other Ambulatory Visit: Payer: Self-pay | Admitting: Internal Medicine

## 2018-11-07 DIAGNOSIS — I10 Essential (primary) hypertension: Secondary | ICD-10-CM

## 2018-11-08 ENCOUNTER — Ambulatory Visit: Payer: Self-pay

## 2018-11-08 ENCOUNTER — Other Ambulatory Visit: Payer: Self-pay

## 2018-11-08 ENCOUNTER — Encounter: Payer: Self-pay | Admitting: Internal Medicine

## 2018-11-08 ENCOUNTER — Ambulatory Visit (INDEPENDENT_AMBULATORY_CARE_PROVIDER_SITE_OTHER): Payer: Medicare HMO | Admitting: Internal Medicine

## 2018-11-08 VITALS — BP 146/70 | HR 55 | Temp 97.6°F | Ht 62.75 in | Wt 232.6 lb

## 2018-11-08 DIAGNOSIS — Z23 Encounter for immunization: Secondary | ICD-10-CM | POA: Diagnosis not present

## 2018-11-08 DIAGNOSIS — Z0000 Encounter for general adult medical examination without abnormal findings: Secondary | ICD-10-CM | POA: Diagnosis not present

## 2018-11-08 DIAGNOSIS — Z6841 Body Mass Index (BMI) 40.0 and over, adult: Secondary | ICD-10-CM

## 2018-11-08 DIAGNOSIS — N182 Chronic kidney disease, stage 2 (mild): Secondary | ICD-10-CM

## 2018-11-08 DIAGNOSIS — E1122 Type 2 diabetes mellitus with diabetic chronic kidney disease: Secondary | ICD-10-CM

## 2018-11-08 DIAGNOSIS — Z794 Long term (current) use of insulin: Secondary | ICD-10-CM

## 2018-11-08 DIAGNOSIS — R252 Cramp and spasm: Secondary | ICD-10-CM | POA: Diagnosis not present

## 2018-11-08 DIAGNOSIS — I129 Hypertensive chronic kidney disease with stage 1 through stage 4 chronic kidney disease, or unspecified chronic kidney disease: Secondary | ICD-10-CM

## 2018-11-08 LAB — POCT URINALYSIS DIPSTICK
Bilirubin, UA: NEGATIVE
Blood, UA: NEGATIVE
Glucose, UA: NEGATIVE
Ketones, UA: NEGATIVE
Nitrite, UA: NEGATIVE
Protein, UA: NEGATIVE
Spec Grav, UA: 1.02 (ref 1.010–1.025)
Urobilinogen, UA: 0.2 E.U./dL
pH, UA: 5.5 (ref 5.0–8.0)

## 2018-11-08 MED ORDER — ROSUVASTATIN CALCIUM 20 MG PO TABS: 20.0000 mg | ORAL_TABLET | Freq: Every day | ORAL | 1 refills | Status: DC

## 2018-11-08 NOTE — Chronic Care Management (AMB) (Signed)
**Note De-Identified Leslie Obfuscation**   Chronic Care Management   Outreach Note  11/08/2018 Name: Leslie Davis MRN: [id number] DOB: [birth year]  Referred by: [name], MD Reason for referral : Chronic Care Management (INITIAL CCM RNCM Telephone Outreach )   An unsuccessful telephone outreach was attempted today. The patient was referred to the case management team by [name] MD for assistance with chronic care management and care coordination.   Follow Up Plan: Telephone follow up appointment with care management team member scheduled for: 11/30/18  Barb Merino, RN, BSN, CCM Care Management Coordinator [facility]  Direct Phone: [phone]

## 2018-11-09 DIAGNOSIS — E1122 Type 2 diabetes mellitus with diabetic chronic kidney disease: Secondary | ICD-10-CM | POA: Diagnosis not present

## 2018-11-09 DIAGNOSIS — N182 Chronic kidney disease, stage 2 (mild): Secondary | ICD-10-CM | POA: Diagnosis not present

## 2018-11-09 DIAGNOSIS — Z794 Long term (current) use of insulin: Secondary | ICD-10-CM | POA: Diagnosis not present

## 2018-11-09 LAB — HEMOGLOBIN A1C
Est. average glucose Bld gHb Est-mCnc: 148 mg/dL
Hgb A1c MFr Bld: 6.8 % — ABNORMAL HIGH (ref 4.8–5.6)

## 2018-11-09 LAB — HEPATIC FUNCTION PANEL
ALT: 27 IU/L (ref 0–32)
AST: 22 IU/L (ref 0–40)
Albumin: 4.4 g/dL (ref 3.8–4.8)
Alkaline Phosphatase: 101 IU/L (ref 39–117)
Bilirubin Total: 0.3 mg/dL (ref 0.0–1.2)
Bilirubin, Direct: 0.09 mg/dL (ref 0.00–0.40)
Total Protein: 7.2 g/dL (ref 6.0–8.5)

## 2018-11-09 LAB — TSH: TSH: 1.69 u[IU]/mL (ref 0.450–4.500)

## 2018-11-09 LAB — MAGNESIUM: Magnesium: 1.9 mg/dL (ref 1.6–2.3)

## 2018-11-10 LAB — MICROALBUMIN / CREATININE URINE RATIO
Creatinine, Urine: 140.5 mg/dL
Microalb/Creat Ratio: 43 mg/g creat — ABNORMAL HIGH (ref 0–29)
Microalbumin, Urine: 60.3 ug/mL

## 2018-11-15 ENCOUNTER — Encounter: Payer: Self-pay | Admitting: Internal Medicine

## 2018-11-16 ENCOUNTER — Other Ambulatory Visit: Payer: Self-pay | Admitting: Gastroenterology

## 2018-11-16 ENCOUNTER — Other Ambulatory Visit: Payer: Self-pay | Admitting: Internal Medicine

## 2018-11-16 DIAGNOSIS — G4700 Insomnia, unspecified: Secondary | ICD-10-CM

## 2018-11-16 NOTE — Telephone Encounter (Signed)
Temazepam refill 

## 2018-11-17 NOTE — Patient Instructions (Signed)
Health Maintenance, Female Adopting a healthy lifestyle and getting preventive care are important in promoting health and wellness. Ask your health care provider about:  The right schedule for you to have regular tests and exams.  Things you can do on your own to prevent diseases and keep yourself healthy. What should I know about diet, weight, and exercise? Eat a healthy diet   Eat a diet that includes plenty of vegetables, fruits, low-fat dairy products, and lean protein.  Do not eat a lot of foods that are high in solid fats, added sugars, or sodium. Maintain a healthy weight Body mass index (BMI) is used to identify weight problems. It estimates body fat based on height and weight. Your health care provider can help determine your BMI and help you achieve or maintain a healthy weight. Get regular exercise Get regular exercise. This is one of the most important things you can do for your health. Most adults should:  Exercise for at least 150 minutes each week. The exercise should increase your heart rate and make you sweat (moderate-intensity exercise).  Do strengthening exercises at least twice a week. This is in addition to the moderate-intensity exercise.  Spend less time sitting. Even light physical activity can be beneficial. Watch cholesterol and blood lipids Have your blood tested for lipids and cholesterol at 65 years of age, then have this test every 5 years. Have your cholesterol levels checked more often if:  Your lipid or cholesterol levels are high.  You are older than 65 years of age.  You are at high risk for heart disease. What should I know about cancer screening? Depending on your health history and family history, you may need to have cancer screening at various ages. This may include screening for:  Breast cancer.  Cervical cancer.  Colorectal cancer.  Skin cancer.  Lung cancer. What should I know about heart disease, diabetes, and high blood  pressure? Blood pressure and heart disease  High blood pressure causes heart disease and increases the risk of stroke. This is more likely to develop in people who have high blood pressure readings, are of African descent, or are overweight.  Have your blood pressure checked: ? Every 3-5 years if you are 18-39 years of age. ? Every year if you are 40 years old or older. Diabetes Have regular diabetes screenings. This checks your fasting blood sugar level. Have the screening done:  Once every three years after age 40 if you are at a normal weight and have a low risk for diabetes.  More often and at a younger age if you are overweight or have a high risk for diabetes. What should I know about preventing infection? Hepatitis B If you have a higher risk for hepatitis B, you should be screened for this virus. Talk with your health care provider to find out if you are at risk for hepatitis B infection. Hepatitis C Testing is recommended for:  Everyone born from 1945 through 1965.  Anyone with known risk factors for hepatitis C. Sexually transmitted infections (STIs)  Get screened for STIs, including gonorrhea and chlamydia, if: ? You are sexually active and are younger than 65 years of age. ? You are older than 65 years of age and your health care provider tells you that you are at risk for this type of infection. ? Your sexual activity has changed since you were last screened, and you are at increased risk for chlamydia or gonorrhea. Ask your health care provider if   you are at risk.  Ask your health care provider about whether you are at high risk for HIV. Your health care provider may recommend a prescription medicine to help prevent HIV infection. If you choose to take medicine to prevent HIV, you should first get tested for HIV. You should then be tested every 3 months for as long as you are taking the medicine. Pregnancy  If you are about to stop having your period (premenopausal) and  you may become pregnant, seek counseling before you get pregnant.  Take 400 to 800 micrograms (mcg) of folic acid every day if you become pregnant.  Ask for birth control (contraception) if you want to prevent pregnancy. Osteoporosis and menopause Osteoporosis is a disease in which the bones lose minerals and strength with aging. This can result in bone fractures. If you are 65 years old or older, or if you are at risk for osteoporosis and fractures, ask your health care provider if you should:  Be screened for bone loss.  Take a calcium or vitamin D supplement to lower your risk of fractures.  Be given hormone replacement therapy (HRT) to treat symptoms of menopause. Follow these instructions at home: Lifestyle  Do not use any products that contain nicotine or tobacco, such as cigarettes, e-cigarettes, and chewing tobacco. If you need help quitting, ask your health care provider.  Do not use street drugs.  Do not share needles.  Ask your health care provider for help if you need support or information about quitting drugs. Alcohol use  Do not drink alcohol if: ? Your health care provider tells you not to drink. ? You are pregnant, may be pregnant, or are planning to become pregnant.  If you drink alcohol: ? Limit how much you use to 0-1 drink a day. ? Limit intake if you are breastfeeding.  Be aware of how much alcohol is in your drink. In the [place] one drink equals one 12 oz bottle of beer (355 mL), one 5 oz glass of wine (148 mL), or one 1 oz glass of hard liquor (44 mL). General instructions  Schedule regular health, dental, and eye exams.  Stay current with your vaccines.  Tell your health care provider if: ? You often feel depressed. ? You have ever been abused or do not feel safe at home. Summary  Adopting a healthy lifestyle and getting preventive care are important in promoting health and wellness.  Follow your health care provider's instructions about healthy  diet, exercising, and getting tested or screened for diseases.  Follow your health care provider's instructions on monitoring your cholesterol and blood pressure. This information is not intended to replace advice given to you by your health care provider. Make sure you discuss any questions you have with your health care provider. Document Released: 09/21/2010 Document Revised: 03/01/2018 Document Reviewed: 03/01/2018 Elsevier Patient Education  2020 Elsevier Inc.  

## 2018-11-17 NOTE — Progress Notes (Signed)
Subjective:     Patient ID: Leslie Davis , female    DOB: 1955-10-03 , 63 y.o.   MRN: [id number]   Chief Complaint  Patient presents with  . Annual Exam  . Diabetes  . Hypertension    HPI  She is here today for a full physical examination. She is followed by GYN for her pelvic exams.   Diabetes She presents for her follow-up diabetic visit. She has type 2 diabetes mellitus. There are no hypoglycemic associated symptoms. Pertinent negatives for diabetes include no blurred vision and no chest pain. There are no hypoglycemic complications. Diabetic complications include nephropathy. Risk factors for coronary artery disease include diabetes mellitus, dyslipidemia, hypertension, obesity, sedentary lifestyle and post-menopausal. She is compliant with treatment most of the time. She is following a diabetic diet. She participates in exercise intermittently. An ACE inhibitor/angiotensin II receptor blocker is being taken. Eye exam is current.  Hypertension This is a chronic problem. The current episode started more than 1 year ago. The problem has been gradually improving since onset. The problem is uncontrolled. Pertinent negatives include no blurred vision, chest pain, palpitations or shortness of breath. Past treatments include calcium channel blockers, angiotensin blockers and diuretics. The current treatment provides moderate improvement. Hypertensive end-organ damage includes kidney disease.     Past Medical History:  Diagnosis Date  . DM (diabetes mellitus) ([place])   . Fatty liver disease, nonalcoholic   . Gastritis   . Gastroparesis   . GERD (gastroesophageal reflux disease)   . Hiatal hernia   . Hiatal hernia   . HLD (hyperlipidemia)   . HTN (hypertension)   . Renal calculus      Family History  Problem Relation Age of Onset  . Diabetes Mother   . Diabetes Maternal Grandmother   . Stomach cancer Maternal Aunt        X 2 aunts  . Hypertension Father   . Colon cancer Neg  Hx   . Esophageal cancer Neg Hx   . Rectal cancer Neg Hx      Current Outpatient Medications:  .  amLODipine (NORVASC) 10 MG tablet, TAKE 1 TABLET ONCE DAILY., Disp: 90 tablet, Rfl: 0 .  aspirin EC 81 MG tablet, Take 81 mg by mouth daily., Disp: , Rfl:  .  EDARBI 80 MG TABS, TAKE 1 TABLET EACH DAY., Disp: 30 tablet, Rfl: 0 .  hydrochlorothiazide (HYDRODIURIL) 12.5 MG tablet, TAKE 1 TABLET ONCE DAILY., Disp: 90 tablet, Rfl: 1 .  metFORMIN (GLUCOPHAGE) 850 MG tablet, Take 1 tablet (850 mg total) by mouth 2 (two) times daily with a meal., Disp: 60 tablet, Rfl: 1 .  OZEMPIC, 1 MG/DOSE, 2 MG/1.5ML SOPN, INJECT 1MG  ONCE A WEEK IN THE ABDOMEN, THIGHS OR UPPER ARM ROTATING INJECTION SITES. (Patient taking differently: Inject 1 mg into the skin once a week. ), Disp: 3 mL, Rfl: 0 .  pantoprazole (PROTONIX) 40 MG tablet, TAKE 1 TABLET ONCE DAILY., Disp: 30 tablet, Rfl: 0 .  TOUJEO SOLOSTAR 300 UNIT/ML SOPN, INJECT 32 UNITS SUBCUTANEOUSLY ONCE DAILY AS DIRECTED., Disp: 4.5 mL, Rfl: 2 .  ferrous gluconate (FERGON) 324 MG tablet, TAKE 1 TABLET BY MOUTH TWICE DAILY WITH A MEAL., Disp: 100 tablet, Rfl: 0 .  rosuvastatin (CRESTOR) 20 MG tablet, Take 1 tablet (20 mg total) by mouth daily., Disp: 90 tablet, Rfl: 1 .  temazepam (RESTORIL) 30 MG capsule, TAKE 1 CAPSULE AT BEDTIME AS NEEDED., Disp: 30 capsule, Rfl: 0   No Known Allergies  Review of Systems  Constitutional: Negative.   HENT: Negative.   Eyes: Negative.  Negative for blurred vision.  Respiratory: Negative.  Negative for shortness of breath.   Cardiovascular: Negative.  Negative for chest pain and palpitations.  Endocrine: Negative.   Genitourinary: Negative.   Musculoskeletal: Positive for myalgias.       She c/o muscle cramps. She thinks it could be due to use of chol medication. She is unable to state what triggers her sx.   Skin: Negative.   Allergic/Immunologic: Negative.   Neurological: Negative.   Hematological: Negative.    Psychiatric/Behavioral: Negative.      Today's Vitals   11/08/18 0907  BP: (!) 146/70  Pulse: (!) 55  Temp: 97.6 F (36.4 C)  TempSrc: Oral  Weight: 232 lb 9.6 oz (105.5 kg)  Height: 5' 2.75" (1.594 m)   Body mass index is 41.53 kg/m.   Objective:  Physical Exam Vitals signs and nursing note reviewed.  Constitutional:      Appearance: Normal appearance. She is obese.  HENT:     Head: Normocephalic and atraumatic.     Right Ear: Tympanic membrane, ear canal and external ear normal.     Left Ear: Tympanic membrane, ear canal and external ear normal.     Nose: Nose normal.     Mouth/Throat:     Mouth: Mucous membranes are moist.     Pharynx: Oropharynx is clear.  Eyes:     Extraocular Movements: Extraocular movements intact.     Conjunctiva/sclera: Conjunctivae normal.     Pupils: Pupils are equal, round, and reactive to light.  Neck:     Musculoskeletal: Normal range of motion and neck supple.  Cardiovascular:     Rate and Rhythm: Normal rate and regular rhythm.     Pulses: Normal pulses.          Dorsalis pedis pulses are 2+ on the right side and 2+ on the left side.     Heart sounds: Normal heart sounds.  Pulmonary:     Effort: Pulmonary effort is normal.     Breath sounds: Normal breath sounds.  Chest:     Breasts: Tanner Score is 5.        Right: Normal. No swelling, bleeding, inverted nipple, mass, nipple discharge or skin change.        Left: Normal. No swelling, bleeding, inverted nipple, mass, nipple discharge or skin change.  Abdominal:     General: Bowel sounds are normal.     Palpations: Abdomen is soft.     Comments: Obese, difficult to assess organomegaly  Genitourinary:    Comments: deferred Musculoskeletal: Normal range of motion.  Feet:     Right foot:     Protective Sensation: 5 sites tested. 5 sites sensed.     Skin integrity: Skin integrity normal.     Toenail Condition: Right toenails are normal.     Left foot:     Protective Sensation:  5 sites tested. 5 sites sensed.     Skin integrity: Skin integrity normal.     Toenail Condition: Left toenails are normal.  Skin:    General: Skin is warm and dry.  Neurological:     General: No focal deficit present.     Mental Status: She is alert and oriented to person, place, and time.  Psychiatric:        Mood and Affect: Mood normal.        Behavior: Behavior normal.  Assessment And Plan:     1. Routine general medical examination at health care facility  A full exam was performed.  Importance of monthly self breast exams was discussed with the patient.  PATIENT HAS BEEN ADVISED TO GET 30-45 MINUTES REGULAR EXERCISE NO LESS THAN FOUR TO FIVE DAYS PER WEEK - BOTH WEIGHTBEARING EXERCISES AND AEROBIC ARE RECOMMENDED.  SHE WAS ADVISED TO FOLLOW A HEALTHY DIET WITH AT LEAST SIX FRUITS/VEGGIES PER DAY, DECREASE INTAKE OF RED MEAT, AND TO INCREASE FISH INTAKE TO TWO DAYS PER WEEK.  MEATS/FISH SHOULD NOT BE FRIED, BAKED OR BROILED IS PREFERABLE.  I SUGGEST WEARING SPF 50 SUNSCREEN ON EXPOSED PARTS AND ESPECIALLY WHEN IN THE DIRECT SUNLIGHT FOR AN EXTENDED PERIOD OF TIME.  PLEASE AVOID FAST FOOD RESTAURANTS AND INCREASE YOUR WATER INTAKE.  - POCT Urinalysis Dipstick (81002)  2. Type 2 diabetes mellitus with stage 2 chronic kidney disease, with long-term current use of insulin ([place])  Diabetic foot exam was performed. I DISCUSSED WITH THE PATIENT AT LENGTH REGARDING THE GOALS OF GLYCEMIC CONTROL AND POSSIBLE LONG-TERM COMPLICATIONS.  I  ALSO STRESSED THE IMPORTANCE OF COMPLIANCE WITH HOME GLUCOSE MONITORING, DIETARY RESTRICTIONS INCLUDING AVOIDANCE OF SUGARY DRINKS/PROCESSED FOODS,  ALONG WITH REGULAR EXERCISE.  I  ALSO STRESSED THE IMPORTANCE OF ANNUAL EYE EXAMS, SELF FOOT CARE AND COMPLIANCE WITH OFFICE VISITS.  - Hemoglobin A1c - TSH - Liver Profile - Referral to Chronic Care Management Services - Microalbumin / creatinine urine ratio  3. Hypertensive nephropathy  Fair  control. She will continue with current meds for now. Importance of medication, dietary and exercise compliance was discussed with the patient. EKG performed, no acute changes noted. I will also refer her to CCM for medication assistance.   - EKG 12-Lead - Referral to Chronic Care Management Services  4. Muscle cramps  I will check mg level. She is encouraged to stay well hydrated. She may also benefit from magnesium supplementation. If no relief of her sx, I will consider changing statin use to MWF or M-F dosing. She is in agreement with her treatment plan.   - Magnesium  5. Class 3 severe obesity due to excess calories with serious comorbidity and body mass index (BMI) of 40.0 to 44.9 in adult [facility])  Importance of achieving optimal weight to decrease risk of cardiovascular disease and cancers was discussed with the patient in full detail. Importance of regular exercise was discussed with the patient.  She is encouraged to start slowly - start with 10 minutes twice daily at least three to four days per week and to gradually build to 30 minutes five days weekly. She was given tips to incorporate more activity into her daily routine - take stairs when possible, park farther away from her job, grocery stores, etc.    6. Need for vaccination  She was given high dose flu vaccine.   [name], MD    THE PATIENT IS ENCOURAGED TO PRACTICE SOCIAL DISTANCING DUE TO THE COVID-19 PANDEMIC.

## 2018-11-28 ENCOUNTER — Telehealth: Payer: Self-pay

## 2018-11-30 ENCOUNTER — Telehealth: Payer: Self-pay

## 2018-11-30 ENCOUNTER — Ambulatory Visit (INDEPENDENT_AMBULATORY_CARE_PROVIDER_SITE_OTHER): Payer: Medicare HMO | Admitting: Pharmacist

## 2018-11-30 DIAGNOSIS — E1122 Type 2 diabetes mellitus with diabetic chronic kidney disease: Secondary | ICD-10-CM

## 2018-11-30 DIAGNOSIS — I129 Hypertensive chronic kidney disease with stage 1 through stage 4 chronic kidney disease, or unspecified chronic kidney disease: Secondary | ICD-10-CM

## 2018-11-30 DIAGNOSIS — Z794 Long term (current) use of insulin: Secondary | ICD-10-CM | POA: Diagnosis not present

## 2018-11-30 DIAGNOSIS — N182 Chronic kidney disease, stage 2 (mild): Secondary | ICD-10-CM

## 2018-12-01 ENCOUNTER — Other Ambulatory Visit: Payer: Self-pay | Admitting: Internal Medicine

## 2018-12-01 ENCOUNTER — Other Ambulatory Visit: Payer: Self-pay | Admitting: Pharmacy Technician

## 2018-12-01 DIAGNOSIS — G8929 Other chronic pain: Secondary | ICD-10-CM | POA: Diagnosis not present

## 2018-12-01 DIAGNOSIS — M25562 Pain in left knee: Secondary | ICD-10-CM | POA: Diagnosis not present

## 2018-12-01 DIAGNOSIS — M1712 Unilateral primary osteoarthritis, left knee: Secondary | ICD-10-CM | POA: Diagnosis not present

## 2018-12-01 NOTE — Patient Outreach (Signed)
[facility]) Care Management  12/01/2018  Leslie Davis 1955/07/05 [id number]                                       Medication Assistance Referral  Referral From: [facility] RPh Jenne Pane (Clinic RPh)  Medication/Company: Nelva Nay / [employer] Patient application portion:  Mailed Provider application portion: Faxed  to Dr. Johnnye Lana Provider address/fax verified via: Office website  Medication/Company: Larna Daughters / [employer] Patient application portion:  Education officer, museum portion: Faxed  to Dr. Johnnye Lana Provider address/fax verified via: Office website   Follow up:  Will follow up with patient in 7-10 business days to confirm application(s) have been received.  Leslie Davis Certified Pharmacy Technician [place] [facility] Direct Dial:[phone]

## 2018-12-05 NOTE — Progress Notes (Signed)
Chronic Care Management   Initial Visit Note  11/30/2018 Name: Leslie Davis MRN: [id number] DOB: 12-Apr-1955  Referred by: [name], MD Reason for referral : Chronic Care Management   Leslie Davis is a 63 y.o. year old female who is a primary care patient of [name], MD. The CCM team was consulted for assistance with chronic disease management and care coordination needs.   Review of patient status, including review of consultants reports, relevant laboratory and other test results, and collaboration with appropriate care team members and the patient's provider was performed as part of comprehensive patient evaluation and provision of chronic care management services.    I spoke with Leslie Davis by telephone today.  Advanced Directives Status: N See Care Plan and Vynca application for related entries.   Medications: Outpatient Encounter Medications as of 11/30/2018  Medication Sig  . aspirin EC 81 MG tablet Take 81 mg by mouth daily.  Marland Kitchen EDARBI 80 MG TABS TAKE 1 TABLET EACH DAY.  . ferrous gluconate (FERGON) 324 MG tablet TAKE 1 TABLET BY MOUTH TWICE DAILY WITH A MEAL.  . hydrochlorothiazide (HYDRODIURIL) 12.5 MG tablet TAKE 1 TABLET ONCE DAILY.  . metFORMIN (GLUCOPHAGE) 850 MG tablet Take 1 tablet (850 mg total) by mouth 2 (two) times daily with a meal.  . OZEMPIC, 1 MG/DOSE, 2 MG/1.5ML SOPN INJECT 1MG  ONCE A WEEK IN THE ABDOMEN, THIGHS OR UPPER ARM ROTATING INJECTION SITES. (Patient taking differently: Inject 1 mg into the skin once a week. )  . pantoprazole (PROTONIX) 40 MG tablet TAKE 1 TABLET ONCE DAILY.  . rosuvastatin (CRESTOR) 20 MG tablet Take 1 tablet (20 mg total) by mouth daily.  . temazepam (RESTORIL) 30 MG capsule TAKE 1 CAPSULE AT BEDTIME AS NEEDED.  Marland Kitchen TOUJEO SOLOSTAR 300 UNIT/ML SOPN INJECT 32 UNITS SUBCUTANEOUSLY ONCE DAILY AS DIRECTED.  . [DISCONTINUED] amLODipine (NORVASC) 10 MG tablet TAKE 1 TABLET ONCE DAILY.   No facility-administered  encounter medications on file as of 11/30/2018.      Objective:   Goals Addressed            This Visit's Progress     Patient Stated   . I would like to apply for financial assistance for my medications. (pt-stated)       Current Barriers:  . Financial Barriers: patient has [employer] and reports copay for [employer] is cost prohibitive at this time   Pharmacist Clinical Goal(s):  Marland Kitchen Over the next 30 days, patient will work with PharmD and providers to relieve medication access concerns  Interventions: . Comprehensive medication review completed; medication list updated in electronic medical record.  Leslie Davis by [employer] and Toujeo by [employer]: Patient meets income/3%out of pocket spend criteria for this medication's patient assistance program. Reviewed application process. Patient will provide proof of income, out of pocket spend report, and will sign application. Will collaborate with primary care provider, [name] for their portion of application. Once completed, will submit to [employer] patient assistance program. . Patient agreeable to participate.  Patient Self Care Activities:  . Patient will provide necessary portions of application   Initial goal documentation     . I would like to continue to optimize my medication managment of my chronic conditions (pt-stated)       Current Barriers:  . Diabetes: T2DM; most recent A1c 6.8% on 11/08/18  . Current antihyperglycemic regimen: Ozempic 1mg  weekly, metformin, Toujeo 32 units qHS . denies hypoglycemic symptoms;denies hyperglycemic symptoms . Current exercise:  walking . Current blood glucose readings: FBG<130 . Cardiovascular risk reduction: o Current hypertensive regimen: amlodipine, edarbi, hctz o Current hyperlipidemia regimen: rosuvastatin 20mg  daily (last filled 11/02/18 for 90DS)  Pharmacist Clinical Goal(s):  Marland Kitchen Over the next 90 days, patient with work with PharmD and primary care provider  to address optimized medication management of chronic conditions  Interventions: . Comprehensive medication review performed, medication list updated in electronic medical record . Comprehensive medication review performed.  Reviewed medication fill history via insurance claims data confirming patient appears compliant with having his medications filled on time as prescribed by provider. . Reviewed & discussed the following diabetes-related information with patient: o Continue checking blood sugars as directed o Follow ADA recommended "diabetes-friendly" diet  (reviewed healthy snack/food options) o Reviewed medication purpose/side effects-->patient denies adverse events o Continue taking all medications as prescribed by provider .   Patient Self Care Activities:  . Patient will check blood glucose daily, document, and provide at future appointments . Patient will focus on medication adherence by continuing to take medications as prescribed. . Patient will take medications as prescribed . Patient will contact provider with any episodes of hypoglycemia . Patient will report any questions or concerns to provider   Initial goal documentation       Plan:   The care management team will reach out to the patient again over the next 4 weeks.  Regina Eck, PharmD, BCPS Clinical Pharmacist, [facility] [place]: [phone]

## 2018-12-05 NOTE — Patient Instructions (Signed)
Visit Information  Goals Addressed            This Visit's Progress     Patient Stated   . I would like to apply for financial assistance for my medications. (pt-stated)       Current Barriers:  . Financial Barriers: patient has [employer] and reports copay for [employer] is cost prohibitive at this time   Pharmacist Clinical Goal(s):  Marland Kitchen Over the next 30 days, patient will work with PharmD and providers to relieve medication access concerns  Interventions: . Comprehensive medication review completed; medication list updated in electronic medical record.  Larna Daughters by [employer] and Toujeo by [employer]: Patient meets income/3%out of pocket spend criteria for this medication's patient assistance program. Reviewed application process. Patient will provide proof of income, out of pocket spend report, and will sign application. Will collaborate with primary care provider, [name] for their portion of application. Once completed, will submit to [employer] patient assistance program. . Patient agreeable to participate.  Patient Self Care Activities:  . Patient will provide necessary portions of application   Initial goal documentation     . I would like to continue to optimize my medication managment of my chronic conditions (pt-stated)       Current Barriers:  . Diabetes: T2DM; most recent A1c 6.8% on 11/08/18  . Current antihyperglycemic regimen: Ozempic 1mg  weekly, metformin, Toujeo 32 units qHS . denies hypoglycemic symptoms;denies hyperglycemic symptoms . Current exercise: walking . Current blood glucose readings: FBG<130 . Cardiovascular risk reduction: o Current hypertensive regimen: amlodipine, edarbi, hctz o Current hyperlipidemia regimen: rosuvastatin 20mg  daily (last filled 11/02/18 for 90DS)  Pharmacist Clinical Goal(s):  Marland Kitchen Over the next 90 days, patient with work with PharmD and primary care provider to address optimized medication management of  chronic conditions  Interventions: . Comprehensive medication review performed, medication list updated in electronic medical record . Comprehensive medication review performed.  Reviewed medication fill history via insurance claims data confirming patient appears compliant with having his medications filled on time as prescribed by provider. . Reviewed & discussed the following diabetes-related information with patient: o Continue checking blood sugars as directed o Follow ADA recommended "diabetes-friendly" diet  (reviewed healthy snack/food options) o Reviewed medication purpose/side effects-->patient denies adverse events o Continue taking all medications as prescribed by provider .   Patient Self Care Activities:  . Patient will check blood glucose daily, document, and provide at future appointments . Patient will focus on medication adherence by continuing to take medications as prescribed. . Patient will take medications as prescribed . Patient will contact provider with any episodes of hypoglycemia . Patient will report any questions or concerns to provider   Initial goal documentation        The patient verbalized understanding of instructions provided today and declined a print copy of patient instruction materials.   The care management team will reach out to the patient again over the next 4 weeks.  Regina Eck, PharmD, BCPS Clinical Pharmacist, [facility] [place]: [phone]

## 2018-12-11 ENCOUNTER — Other Ambulatory Visit: Payer: Self-pay | Admitting: Gastroenterology

## 2018-12-12 ENCOUNTER — Other Ambulatory Visit: Payer: Self-pay | Admitting: Internal Medicine

## 2018-12-12 DIAGNOSIS — R262 Difficulty in walking, not elsewhere classified: Secondary | ICD-10-CM | POA: Diagnosis not present

## 2018-12-12 DIAGNOSIS — G8929 Other chronic pain: Secondary | ICD-10-CM | POA: Diagnosis not present

## 2018-12-12 DIAGNOSIS — G4700 Insomnia, unspecified: Secondary | ICD-10-CM

## 2018-12-12 DIAGNOSIS — M2560 Stiffness of unspecified joint, not elsewhere classified: Secondary | ICD-10-CM | POA: Diagnosis not present

## 2018-12-12 DIAGNOSIS — M25562 Pain in left knee: Secondary | ICD-10-CM | POA: Diagnosis not present

## 2018-12-12 DIAGNOSIS — M6281 Muscle weakness (generalized): Secondary | ICD-10-CM | POA: Diagnosis not present

## 2018-12-12 NOTE — Telephone Encounter (Signed)
REFILL FOR TEMAZEPAM

## 2018-12-15 ENCOUNTER — Telehealth: Payer: Self-pay

## 2018-12-18 ENCOUNTER — Telehealth: Payer: Self-pay

## 2018-12-18 DIAGNOSIS — M1712 Unilateral primary osteoarthritis, left knee: Secondary | ICD-10-CM | POA: Diagnosis not present

## 2018-12-21 DIAGNOSIS — G8929 Other chronic pain: Secondary | ICD-10-CM | POA: Diagnosis not present

## 2018-12-21 DIAGNOSIS — R262 Difficulty in walking, not elsewhere classified: Secondary | ICD-10-CM | POA: Diagnosis not present

## 2018-12-21 DIAGNOSIS — M6281 Muscle weakness (generalized): Secondary | ICD-10-CM | POA: Diagnosis not present

## 2018-12-21 DIAGNOSIS — M2560 Stiffness of unspecified joint, not elsewhere classified: Secondary | ICD-10-CM | POA: Diagnosis not present

## 2018-12-21 DIAGNOSIS — M25562 Pain in left knee: Secondary | ICD-10-CM | POA: Diagnosis not present

## 2018-12-26 ENCOUNTER — Ambulatory Visit (INDEPENDENT_AMBULATORY_CARE_PROVIDER_SITE_OTHER): Payer: Medicare Other | Admitting: Pharmacist

## 2018-12-26 ENCOUNTER — Other Ambulatory Visit: Payer: Self-pay

## 2018-12-26 DIAGNOSIS — M6281 Muscle weakness (generalized): Secondary | ICD-10-CM | POA: Diagnosis not present

## 2018-12-26 DIAGNOSIS — G8929 Other chronic pain: Secondary | ICD-10-CM | POA: Diagnosis not present

## 2018-12-26 DIAGNOSIS — R262 Difficulty in walking, not elsewhere classified: Secondary | ICD-10-CM | POA: Diagnosis not present

## 2018-12-26 DIAGNOSIS — M2560 Stiffness of unspecified joint, not elsewhere classified: Secondary | ICD-10-CM | POA: Diagnosis not present

## 2018-12-26 DIAGNOSIS — E1122 Type 2 diabetes mellitus with diabetic chronic kidney disease: Secondary | ICD-10-CM | POA: Diagnosis not present

## 2018-12-26 DIAGNOSIS — N182 Chronic kidney disease, stage 2 (mild): Secondary | ICD-10-CM | POA: Diagnosis not present

## 2018-12-26 DIAGNOSIS — Z794 Long term (current) use of insulin: Secondary | ICD-10-CM

## 2018-12-26 DIAGNOSIS — M25562 Pain in left knee: Secondary | ICD-10-CM | POA: Diagnosis not present

## 2018-12-26 MED ORDER — INSULIN PEN NEEDLE 32G X 6 MM MISC: 6 refills | Status: DC

## 2018-12-26 MED ORDER — METFORMIN HCL 850 MG PO TABS: 850.0000 mg | ORAL_TABLET | Freq: Two times a day (BID) | ORAL | 1 refills | Status: DC

## 2018-12-27 ENCOUNTER — Ambulatory Visit: Payer: Self-pay

## 2018-12-27 ENCOUNTER — Ambulatory Visit: Payer: Self-pay | Admitting: Pharmacist

## 2018-12-27 DIAGNOSIS — Z794 Long term (current) use of insulin: Secondary | ICD-10-CM

## 2018-12-27 DIAGNOSIS — N182 Chronic kidney disease, stage 2 (mild): Secondary | ICD-10-CM

## 2018-12-27 DIAGNOSIS — E1122 Type 2 diabetes mellitus with diabetic chronic kidney disease: Secondary | ICD-10-CM

## 2018-12-27 DIAGNOSIS — I10 Essential (primary) hypertension: Secondary | ICD-10-CM

## 2018-12-27 DIAGNOSIS — I129 Hypertensive chronic kidney disease with stage 1 through stage 4 chronic kidney disease, or unspecified chronic kidney disease: Secondary | ICD-10-CM

## 2018-12-27 NOTE — Chronic Care Management (AMB) (Signed)
  Chronic Care Management   Social Work Note  12/27/2018 Name: Leslie Davis MRN: [id number] DOB: 04-30-1955  DARLAH Davis is a 63 y.o. year old female who sees [name], MD for primary care. The CCM team was consulted for assistance with care coordination.  I reached out to [name] in response to communication received from embedded PharmD regarding patients Medicaid coverage. [name] reports being covered by 20% Medicaid. SW determined that although the patients individual income is below the Medicaid eligibility threshold, her combined income with her spouse is over the limit to receive full Medicaid benefits. SW communicated to embedded PharmD outcome of today's call.    Outpatient Encounter Medications as of 12/27/2018  Medication Sig  . amLODipine (NORVASC) 10 MG tablet TAKE 1 TABLET ONCE DAILY.  Marland Kitchen aspirin EC 81 MG tablet Take 81 mg by mouth daily.  Marland Kitchen EDARBI 80 MG TABS TAKE 1 TABLET EACH DAY.  . ferrous gluconate (FERGON) 324 MG tablet TAKE 1 TABLET BY MOUTH TWICE DAILY WITH A MEAL.  . hydrochlorothiazide (HYDRODIURIL) 12.5 MG tablet TAKE 1 TABLET ONCE DAILY.  Marland Kitchen Insulin Pen Needle 32G X 6 MM MISC Use with pen injectors daily dx code: e11.9  . metFORMIN (GLUCOPHAGE) 850 MG tablet Take 1 tablet (850 mg total) by mouth 2 (two) times daily with a meal.  . OZEMPIC, 1 MG/DOSE, 2 MG/1.5ML SOPN INJECT 1MG  ONCE A WEEK IN THE ABDOMEN, THIGHS OR UPPER ARM ROTATING INJECTION SITES. (Patient taking differently: Inject 1 mg into the skin once a week. )  . pantoprazole (PROTONIX) 40 MG tablet TAKE 1 TABLET ONCE DAILY.  . rosuvastatin (CRESTOR) 20 MG tablet Take 1 tablet (20 mg total) by mouth daily.  . temazepam (RESTORIL) 30 MG capsule TAKE 1 CAPSULE AT BEDTIME AS NEEDED.  Marland Kitchen TOUJEO SOLOSTAR 300 UNIT/ML SOPN INJECT 32 UNITS SUBCUTANEOUSLY ONCE DAILY AS DIRECTED.   No facility-administered encounter medications on file as of 12/27/2018.    Follow Up Plan: No further follow  up planned at this time with CCM SW. Patient will continue to work with embedded PharmD regarding extra help application.  Daneen Schick, BSW, CDP Social Worker, Certified Dementia Practitioner [place] [facility] [place] [facility] [phone]

## 2018-12-28 ENCOUNTER — Telehealth: Payer: Self-pay

## 2018-12-29 NOTE — Progress Notes (Signed)
Chronic Care Management    Visit Note  12/26/2018 Name: Leslie Davis MRN: [id number] DOB: June 30, 1955  Referred by: Leslie Chard, MD Reason for referral : Chronic Care Management   Leslie Davis is a 63 y.o. year old female who is a primary care patient of Leslie Chard, MD. The CCM team was consulted for assistance with chronic disease management and care coordination needs related to HTN DMII  Review of patient status, including review of consultants reports, relevant laboratory and other test results, and collaboration with appropriate care team members and the patient's provider was performed as part of comprehensive patient evaluation and provision of chronic care management services.    I met with Leslie Davis today in clinic  Advanced Directives Status: [place] and Vynca application for related entries.   Medications: Outpatient Encounter Medications as of 12/26/2018  Medication Sig   amLODipine (NORVASC) 10 MG tablet TAKE 1 TABLET ONCE DAILY.   aspirin EC 81 MG tablet Take 81 mg by mouth daily.   EDARBI 80 MG TABS TAKE 1 TABLET EACH DAY.   ferrous gluconate (FERGON) 324 MG tablet TAKE 1 TABLET BY MOUTH TWICE DAILY WITH A MEAL.   hydrochlorothiazide (HYDRODIURIL) 12.5 MG tablet TAKE 1 TABLET ONCE DAILY.   Insulin Pen Needle 32G X 6 MM MISC Use with pen injectors daily dx code: e11.9   metFORMIN (GLUCOPHAGE) 850 MG tablet Take 1 tablet (850 mg total) by mouth 2 (two) times daily with a meal.   OZEMPIC, 1 MG/DOSE, 2 MG/1.5ML SOPN INJECT 1MG ONCE A WEEK IN THE ABDOMEN, THIGHS OR UPPER ARM ROTATING INJECTION SITES. (Patient not taking: No sig reported)   pantoprazole (PROTONIX) 40 MG tablet TAKE 1 TABLET ONCE DAILY.   rosuvastatin (CRESTOR) 20 MG tablet Take 1 tablet (20 mg total) by mouth daily.   temazepam (RESTORIL) 30 MG capsule TAKE 1 CAPSULE AT BEDTIME AS NEEDED.   TOUJEO SOLOSTAR 300 UNIT/ML SOPN INJECT 32 UNITS SUBCUTANEOUSLY ONCE DAILY AS  DIRECTED.   No facility-administered encounter medications on file as of 12/26/2018.      Objective:   Goals Addressed            This Visit's Progress     Patient Stated    I would like to apply for financial assistance for my medications. (pt-stated)       Current Barriers:   Financial Barriers: patient has [employer] and reports copay for [employer] is cost prohibitive at this time  Pharmacist Clinical Goal(s):   Over the next 30 days, patient will work with PharmD and providers to relieve medication access concerns  Interventions:  Comprehensive medication review completed; medication list updated in electronic medical record.   Ozempic by [employer] and Toujeo by [employer]: Patient meets income/3%out of pocket spend criteria for this medication's patient assistance program. Reviewed application process. Patient will provide proof of income, out of pocket spend report, and will sign application. Will collaborate with primary care provider, Leslie Davis for their portion of application. Once completed, will submit to [employer] patient assistance program.  Patient agreeable to participate.   Application filled out for LIS/Extra Help with patient on 12/27/18.  Patient Self Care Activities:   Patient will provide necessary portions of application   Please see past updates related to this goal by clicking on the "Past Updates" button in the selected goal       I would like to continue to optimize my medication managment of my chronic conditions (  pt-stated)       Current Barriers:   Diabetes: T2DM; most recent A1c 6.8% on 11/08/18   Current antihyperglycemic regimen: Metformin, Toujeo 32 units qHS, Ozempic 50m weekly (NOT taking Ozempic due to side effects)  denies hypoglycemic symptoms;denies hyperglycemic symptoms  Current exercise: walking  Current blood glucose readings: FBG<130  Cardiovascular risk reduction: o Current hypertensive regimen:  amlodipine, edarbi (gave samples), hctz - BP today was 150/70 (BP was 146/70 at past visit)-counseled patient to practice low salt diet, avoid soft drinks (caffeine) o Current hyperlipidemia regimen: rosuvastatin 210mdaily (last filled 11/02/18 for 90DS)  Pharmacist Clinical Goal(s):   Over the next 90 days, patient with work with PharmD and primary care provider to address optimized medication management of chronic conditions  Interventions:  Comprehensive medication review performed, medication list updated in electronic medical record  Comprehensive medication review performed.  Reviewed medication fill history via insurance claims data confirming patient appears compliant with having her medications filled on time as prescribed by provider.  Reviewed & discussed the following diabetes-related information with patient: o Continue checking blood sugars as directed o Follow ADA recommended "diabetes-friendly" diet  (reviewed healthy snack/food options) o Reviewed medication purpose/side effects--> [place]  Patient Self Care Activities:   Patient will check blood glucose daily, document, and provide at future appointments  Patient will focus on medication adherence by continuing to take medications as prescribed.  Patient will take medications as prescribed  Patient will contact provider with any episodes of hypoglycemia  Patient will report any questions or concerns to provider   Please see past updates related to this goal by clicking on the "Past Updates" button in the selected goal          Plan:   The care management team will reach out to the patient again over the next 4 weeks.  JuRegina EckPharmD, BCPS Clinical Pharmacist, [facility] [phone]

## 2018-12-29 NOTE — Patient Instructions (Signed)
Visit Information  Goals Addressed            This Visit's Progress     Patient Stated   . I would like to apply for financial assistance for my medications. (pt-stated)       Current Barriers:  . Financial Barriers: patient has [employer] and reports copay for [employer] is cost prohibitive at this time  Pharmacist Clinical Goal(s):  Marland Kitchen Over the next 30 days, patient will work with PharmD and providers to relieve medication access concerns  Interventions: . Comprehensive medication review completed; medication list updated in electronic medical record.  Larna Daughters by [employer] and Toujeo by [employer]: Patient meets income/3%out of pocket spend criteria for this medication's patient assistance program. Reviewed application process. Patient will provide proof of income, out of pocket spend report, and will sign application. Will collaborate with primary care provider, [name] for their portion of application. Once completed, will submit to [employer] patient assistance program. . Patient agreeable to participate.  Marland Kitchen Application filled out for LIS/Extra Help with patient on 12/27/18.  Patient Self Care Activities:  . Patient will provide necessary portions of application   Please see past updates related to this goal by clicking on the "Past Updates" button in the selected goal      . I would like to continue to optimize my medication managment of my chronic conditions (pt-stated)       Current Barriers:  . Diabetes: T2DM; most recent A1c 6.8% on 11/08/18  . Current antihyperglycemic regimen: Metformin, Toujeo 32 units qHS (sample given), Ozempic 1mg  weekly (NOT taking Ozempic due to side effects) . denies hypoglycemic symptoms;denies hyperglycemic symptoms . Current exercise: walking . Current blood glucose readings: FBG<130 . Cardiovascular risk reduction: o Current hypertensive regimen: amlodipine, edarbi (gave samples), hctz - BP today was 150/70 (BP was  146/70 at past visit)-counseled patient to practice low salt diet, avoid soft drinks (caffeine) o Current hyperlipidemia regimen: rosuvastatin 20mg  daily (last filled 11/02/18 for 90DS)  Pharmacist Clinical Goal(s):  Marland Kitchen Over the next 90 days, patient with work with PharmD and primary care provider to address optimized medication management of chronic conditions  Interventions: . Comprehensive medication review performed, medication list updated in electronic medical record . Comprehensive medication review performed.  Reviewed medication fill history via insurance claims data confirming patient appears compliant with having her medications filled on time as prescribed by provider. . Reviewed & discussed the following diabetes-related information with patient: o Continue checking blood sugars as directed o Follow ADA recommended "diabetes-friendly" diet  (reviewed healthy snack/food options) o Reviewed medication purpose/side effects--> patient complains about GI symptoms from [place] and she has self-discontinued it  Patient Self Care Activities:  . Patient will check blood glucose daily, document, and provide at future appointments . Patient will focus on medication adherence by continuing to take medications as prescribed. . Patient will take medications as prescribed . Patient will contact provider with any episodes of hypoglycemia . Patient will report any questions or concerns to provider   Please see past updates related to this goal by clicking on the "Past Updates" button in the selected goal         The patient verbalized understanding of instructions provided today and declined a print copy of patient instruction materials.   The care management team will reach out to the patient again over the next 4 weeks.  Regina Eck, PharmD, BCPS Clinical Pharmacist, [facility] [place]  II  [place]: [phone]

## 2018-12-29 NOTE — Patient Instructions (Signed)
Visit Information  Goals Addressed            This Visit's Progress     Patient Stated   . I would like to apply for financial assistance for my medications. (pt-stated)       Current Barriers:  . Financial Barriers: patient has [employer] and reports copay for [employer] is cost prohibitive at this time  Pharmacist Clinical Goal(s):  Marland Kitchen Over the next 30 days, patient will work with PharmD and providers to relieve medication access concerns  Interventions: . Comprehensive medication review completed; medication list updated in electronic medical record.  Larna Daughters by [employer] and Toujeo by [employer]: Patient meets income/3%out of pocket spend criteria for this medication's patient assistance program. Reviewed application process. Patient will provide proof of income, out of pocket spend report, and will sign application. Will collaborate with primary care provider, [name] for their portion of application. Once completed, will submit to [employer] patient assistance program. . Patient agreeable to participate.  Marland Kitchen Application filled out for LIS/Extra Help with patient on 12/27/18.  Patient Self Care Activities:  . Patient will provide necessary portions of application   Please see past updates related to this goal by clicking on the "Past Updates" button in the selected goal      . I would like to continue to optimize my medication managment of my chronic conditions (pt-stated)       Current Barriers:  . Diabetes: T2DM; most recent A1c 6.8% on 11/08/18  . Current antihyperglycemic regimen: Metformin, Toujeo 32 units qHS, Ozempic 1mg  weekly (NOT taking Ozempic due to side effects) . denies hypoglycemic symptoms;denies hyperglycemic symptoms . Current exercise: walking . Current blood glucose readings: FBG<130 . Cardiovascular risk reduction: o Current hypertensive regimen: amlodipine, edarbi (gave samples), hctz - BP today was 150/70 (BP was 146/70 at past  visit)-counseled patient to practice low salt diet, avoid soft drinks (caffeine) o Current hyperlipidemia regimen: rosuvastatin 20mg  daily (last filled 11/02/18 for 90DS)  Pharmacist Clinical Goal(s):  Marland Kitchen Over the next 90 days, patient with work with PharmD and primary care provider to address optimized medication management of chronic conditions  Interventions: . Comprehensive medication review performed, medication list updated in electronic medical record . Comprehensive medication review performed.  Reviewed medication fill history via insurance claims data confirming patient appears compliant with having her medications filled on time as prescribed by provider. . Reviewed & discussed the following diabetes-related information with patient: o Continue checking blood sugars as directed o Follow ADA recommended "diabetes-friendly" diet  (reviewed healthy snack/food options) o Reviewed medication purpose/side effects--> patient complains about GI symptoms from [place] and she has self-discontinued it  Patient Self Care Activities:  . Patient will check blood glucose daily, document, and provide at future appointments . Patient will focus on medication adherence by continuing to take medications as prescribed. . Patient will take medications as prescribed . Patient will contact provider with any episodes of hypoglycemia . Patient will report any questions or concerns to provider   Please see past updates related to this goal by clicking on the "Past Updates" button in the selected goal         The patient verbalized understanding of instructions provided today and declined a print copy of patient instruction materials.   The care management team will reach out to the patient again over the next 4 weeks.  Regina Eck, PharmD, [place] Clinical Pharmacist, [facility] [place]  [facility]  Surveyor, [place]: [phone]

## 2018-12-29 NOTE — Progress Notes (Signed)
Chronic Care Management    Visit Note  12/27/2018 Name: Leslie Davis MRN: [id number] DOB: Mar 13, 1956  Referred by: Leslie Chard, MD Reason for referral : Chronic Care Management   Leslie Davis is a 63 y.o. year old female who is a primary care patient of Leslie Chard, MD. The CCM team was consulted for assistance with chronic disease management and care coordination needs related to HTN DMII  Review of patient status, including review of consultants reports, relevant laboratory and other test results, and collaboration with appropriate care team members and the patient's provider was performed as part of comprehensive patient evaluation and provision of chronic care management services.    I spoke with Leslie Davis by telephone today  Advanced Directives Status: [place] and [employer] application for related entries.   Medications: Outpatient Encounter Medications as of 12/27/2018  Medication Sig   amLODipine (NORVASC) 10 MG tablet TAKE 1 TABLET ONCE DAILY.   aspirin EC 81 MG tablet Take 81 mg by mouth daily.   EDARBI 80 MG TABS TAKE 1 TABLET EACH DAY.   ferrous gluconate (FERGON) 324 MG tablet TAKE 1 TABLET BY MOUTH TWICE DAILY WITH A MEAL.   hydrochlorothiazide (HYDRODIURIL) 12.5 MG tablet TAKE 1 TABLET ONCE DAILY.   Insulin Pen Needle 32G X 6 MM MISC Use with pen injectors daily dx code: e11.9   metFORMIN (GLUCOPHAGE) 850 MG tablet Take 1 tablet (850 mg total) by mouth 2 (two) times daily with a meal.   OZEMPIC, 1 MG/DOSE, 2 MG/1.5ML SOPN INJECT 1MG  ONCE A WEEK IN THE ABDOMEN, THIGHS OR UPPER ARM ROTATING INJECTION SITES. (Patient not taking: No sig reported)   pantoprazole (PROTONIX) 40 MG tablet TAKE 1 TABLET ONCE DAILY.   rosuvastatin (CRESTOR) 20 MG tablet Take 1 tablet (20 mg total) by mouth daily.   temazepam (RESTORIL) 30 MG capsule TAKE 1 CAPSULE AT BEDTIME AS NEEDED.   TOUJEO SOLOSTAR 300 UNIT/ML SOPN INJECT 32 UNITS SUBCUTANEOUSLY ONCE DAILY  AS DIRECTED.   No facility-administered encounter medications on file as of 12/27/2018.      Objective:   Goals Addressed            This Visit's Progress     Patient Stated    I would like to apply for financial assistance for my medications. (pt-stated)       Current Barriers:   Financial Barriers: patient has [employer] and reports copay for [employer] is cost prohibitive at this time  Pharmacist Clinical Goal(s):   Over the next 30 days, patient will work with PharmD and providers to relieve medication access concerns  Interventions:  Comprehensive medication review completed; medication list updated in electronic medical record.   Ozempic by [employer] and Toujeo by [employer]: Patient meets income/3%out of pocket spend criteria for this medication's patient assistance program. Reviewed application process. Patient will provide proof of income, out of pocket spend report, and will sign application. Will collaborate with primary care provider, Leslie Davis for their portion of application. Once completed, will submit to [employer] patient assistance program.  Patient agreeable to participate.   Application filled out for LIS/Extra Help with patient on 12/27/18.  Patient Self Care Activities:   Patient will provide necessary portions of application   Please see past updates related to this goal by clicking on the "Past Updates" button in the selected goal       I would like to continue to optimize my medication managment of my chronic conditions (  pt-stated)       Current Barriers:   Diabetes: T2DM; most recent A1c 6.8% on 11/08/18   Current antihyperglycemic regimen: Metformin, Toujeo 32 units qHS (sample given), Ozempic 1mg  weekly (NOT taking Ozempic due to side effects)  denies hypoglycemic symptoms;denies hyperglycemic symptoms  Current exercise: walking  Current blood glucose readings: FBG<130  Cardiovascular risk reduction: o Current  hypertensive regimen: amlodipine, edarbi (gave samples), hctz - BP today was 150/70 (BP was 146/70 at past visit)-counseled patient to practice low salt diet, avoid soft drinks (caffeine) o Current hyperlipidemia regimen: rosuvastatin 20mg  daily (last filled 11/02/18 for 90DS)  Pharmacist Clinical Goal(s):   Over the next 90 days, patient with work with PharmD and primary care provider to address optimized medication management of chronic conditions  Interventions:  Comprehensive medication review performed, medication list updated in electronic medical record  Comprehensive medication review performed.  Reviewed medication fill history via insurance claims data confirming patient appears compliant with having her medications filled on time as prescribed by provider.  Reviewed & discussed the following diabetes-related information with patient: o Continue checking blood sugars as directed o Follow ADA recommended "diabetes-friendly" diet  (reviewed healthy snack/food options) o Reviewed medication purpose/side effects--> patient complains about GI symptoms from [place] and she has self-discontinued it  Patient Self Care Activities:   Patient will check blood glucose daily, document, and provide at future appointments  Patient will focus on medication adherence by continuing to take medications as prescribed.  Patient will take medications as prescribed  Patient will contact provider with any episodes of hypoglycemia  Patient will report any questions or concerns to provider   Please see past updates related to this goal by clicking on the "Past Updates" button in the selected goal          Plan:   The care management team will reach out to the patient again over the next 4 weeks.  Regina Eck, PharmD, BCPS Clinical Pharmacist, [facility] [place]: [phone]

## 2019-01-01 ENCOUNTER — Telehealth: Payer: Self-pay

## 2019-01-01 NOTE — Telephone Encounter (Signed)
error 

## 2019-01-02 DIAGNOSIS — M25562 Pain in left knee: Secondary | ICD-10-CM | POA: Diagnosis not present

## 2019-01-02 DIAGNOSIS — G8929 Other chronic pain: Secondary | ICD-10-CM | POA: Diagnosis not present

## 2019-01-02 DIAGNOSIS — R262 Difficulty in walking, not elsewhere classified: Secondary | ICD-10-CM | POA: Diagnosis not present

## 2019-01-02 DIAGNOSIS — M6281 Muscle weakness (generalized): Secondary | ICD-10-CM | POA: Diagnosis not present

## 2019-01-02 DIAGNOSIS — M2560 Stiffness of unspecified joint, not elsewhere classified: Secondary | ICD-10-CM | POA: Diagnosis not present

## 2019-01-09 DIAGNOSIS — M2560 Stiffness of unspecified joint, not elsewhere classified: Secondary | ICD-10-CM | POA: Diagnosis not present

## 2019-01-09 DIAGNOSIS — M25562 Pain in left knee: Secondary | ICD-10-CM | POA: Diagnosis not present

## 2019-01-09 DIAGNOSIS — R262 Difficulty in walking, not elsewhere classified: Secondary | ICD-10-CM | POA: Diagnosis not present

## 2019-01-09 DIAGNOSIS — M6281 Muscle weakness (generalized): Secondary | ICD-10-CM | POA: Diagnosis not present

## 2019-01-09 DIAGNOSIS — G8929 Other chronic pain: Secondary | ICD-10-CM | POA: Diagnosis not present

## 2019-01-11 ENCOUNTER — Other Ambulatory Visit: Payer: Self-pay | Admitting: Gastroenterology

## 2019-01-17 DIAGNOSIS — G8929 Other chronic pain: Secondary | ICD-10-CM | POA: Diagnosis not present

## 2019-01-17 DIAGNOSIS — M6281 Muscle weakness (generalized): Secondary | ICD-10-CM | POA: Diagnosis not present

## 2019-01-17 DIAGNOSIS — R262 Difficulty in walking, not elsewhere classified: Secondary | ICD-10-CM | POA: Diagnosis not present

## 2019-01-17 DIAGNOSIS — M25562 Pain in left knee: Secondary | ICD-10-CM | POA: Diagnosis not present

## 2019-01-17 DIAGNOSIS — M2560 Stiffness of unspecified joint, not elsewhere classified: Secondary | ICD-10-CM | POA: Diagnosis not present

## 2019-01-18 ENCOUNTER — Ambulatory Visit: Payer: Self-pay | Admitting: Pharmacist

## 2019-01-18 DIAGNOSIS — E1122 Type 2 diabetes mellitus with diabetic chronic kidney disease: Secondary | ICD-10-CM

## 2019-01-18 DIAGNOSIS — I10 Essential (primary) hypertension: Secondary | ICD-10-CM

## 2019-01-18 DIAGNOSIS — Z794 Long term (current) use of insulin: Secondary | ICD-10-CM

## 2019-01-18 DIAGNOSIS — N182 Chronic kidney disease, stage 2 (mild): Secondary | ICD-10-CM

## 2019-01-19 NOTE — Progress Notes (Signed)
Chronic Care Management   Visit Note  01/18/2019 Name: Leslie Davis MRN: [id number] DOB: 1955/08/09  Referred by: Leslie Chard, MD Reason for referral : Chronic Care Management   Leslie Davis is a 63 y.o. year old female who is a primary care patient of Leslie Chard, MD. The CCM team was consulted for assistance with chronic disease management and care coordination needs related to HTN and DMII  Review of patient status, including review of consultants reports, relevant laboratory and other test results, and collaboration with appropriate care team members and the patient's provider was performed as part of comprehensive patient evaluation and provision of chronic care management services.    I spoke with Leslie Davis by telephone today.  Advanced Directives Status: N See Care Plan and [employer]yn[employer] application for related entries.   Medications: Outpatient Encounter Medications as of 01/18/2019  Medication Sig  . amLODipine (NORVASC) 10 MG tablet TAKE 1 TABLET ONCE DAILY.  Marland Kitchen aspirin EC 81 MG tablet Take 81 mg by mouth daily.  Marland Kitchen EDARBI 80 MG TABS TAKE 1 TABLET EACH DAY.  . ferrous gluconate (FERGON) 324 MG tablet TAKE 1 TABLET BY MOUTH TWICE DAILY WITH A MEAL.  . hydrochlorothiazide (HYDRODIURIL) 12.5 MG tablet TAKE 1 TABLET ONCE DAILY.  Marland Kitchen Insulin Pen Needle 32G X 6 MM MISC Use with pen injectors daily dx code: e11.9  . metFORMIN (GLUCOPHAGE) 850 MG tablet Take 1 tablet (850 mg total) by mouth 2 (two) times daily with a meal.  . OZEMPIC, 1 MG/DOSE, 2 MG/1.5ML SOPN INJECT 1MG ONCE A WEEK IN THE ABDOMEN, THIGHS OR UPPER ARM ROTATING INJECTION SITES. (Patient not taking: No sig reported)  . pantoprazole (PROTONIX) 40 MG tablet TAKE 1 TABLET ONCE DAILY.  . rosuvastatin (CRESTOR) 20 MG tablet Take 1 tablet (20 mg total) by mouth daily.  . temazepam (RESTORIL) 30 MG capsule TAKE 1 CAPSULE AT BEDTIME AS NEEDED.  Marland Kitchen TOUJEO SOLOSTAR 300 UNIT/ML SOPN INJECT 32 UNITS SUBCUTANEOUSLY ONCE  DAILY AS DIRECTED.   No facility-administered encounter medications on file as of 01/18/2019.      Objective:   Goals Addressed            This Visit's Progress     Patient Stated   . COMPLETED: I would like to apply for financial assistance for my medications. (pt-stated)       Current Barriers:  . Financial Barriers: patient has [employer] and reports copay for [employer] is cost prohibitive at this time  Pharmacist Clinical Goal(s):  Marland Kitchen Over the next 30 days, patient will work with PharmD and providers to relieve medication access concerns  Interventions: . Comprehensive medication review completed; medication list updated in electronic medical record.  Leslie Davis by [employer] and Toujeo by [employer]: Patient meets income/3%out of pocket spend criteria for this medication's patient assistance program. Reviewed application process. Patient will provide proof of income, out of pocket spend report, and will sign application. Will collaborate with primary care provider, Leslie Davis for their portion of application. Once completed, will submit to [employer] patient assistance program. . Patient agreeable to participate.  Marland Kitchen Application filled out for LIS/Extra Help with patient on 12/27/18-->patient approved for FULL LIS/extra help from Medicare.  Her RX will be $3.40 (21-monthsupply GENERIC copay) and $8.95 (368-monthupply BRAND name copay). . Goal met.  Patient verbalizes understanding.  [facility].  Patient Self Care Activities:  . Patient will provide  necessary portions of application   Please see past updates related to this goal by clicking on the "Past Updates" button in the selected goal      . I would like to continue to optimize my medication managment of my chronic conditions (pt-stated)       Current Barriers:  . Diabetes: T2DM; most recent A1c 6.8% on 11/08/18  . Current antihyperglycemic  regimen: Metformin, Toujeo 32 units qHS (sample given) o Ozempic 81m weekly (NOT taking Ozempic due to side effects) . denies hypoglycemic symptoms;denies hyperglycemic symptoms . Current exercise: walking . Current blood glucose readings: FBG<130 . Cardiovascular risk reduction: o Current hypertensive regimen: amlodipine, edarbi (gave samples), hctz - BP was 150/70 (BP was 146/70 at past visit)-counseled patient to practice low salt diet, avoid soft drinks (caffeine).  Patient has been unable to check.  Encouraged her to come to office to have checked. o Current hyperlipidemia regimen: rosuvastatin 238mdaily (last filled 11/02/18 for 90DS)  Pharmacist Clinical Goal(s):  . Marland Kitchenver the next 90 days, patient with work with PharmD and primary care provider to address optimized medication management of chronic conditions  Interventions: . Comprehensive medication review performed, medication list updated in electronic medical record . Comprehensive medication review performed.  Reviewed medication fill history via insurance claims data confirming patient appears compliant with having her medications filled on time as prescribed by provider. . Reviewed & discussed the following diabetes-related information with patient: o Continue checking blood sugars as directed o Follow ADA recommended "diabetes-friendly" diet  (reviewed healthy snack/food options) o Reviewed medication purpose/side effects--> patient complains about GI symptoms from [place] she has self-discontinued it  Patient Self Care Activities:  . Patient will check blood glucose daily, document, and provide at future appointments . Patient will focus on medication adherence by continuing to take medications as prescribed. . Patient will take medications as prescribed . Patient will contact provider with any episodes of hypoglycemia . Patient will report any questions or concerns to provider   Please see past updates related to this  goal by clicking on the "Past Updates" button in the selected goal         Plan:   The care management team will reach out to the patient again over the next 30 days.   Provider Signature JuRegina EckPharmD, BCPS Clinical Pharmacist, [facility] [phone]

## 2019-01-19 NOTE — Patient Instructions (Signed)
Visit Information  Goals Addressed            This Visit's Progress     Patient Stated   . COMPLETED: I would like to apply for financial assistance for my medications. (pt-stated)       Current Barriers:  . Financial Barriers: patient has [employer] and reports copay for [employer] is cost prohibitive at this time  Pharmacist Clinical Goal(s):  Marland Kitchen Over the next 30 days, patient will work with PharmD and providers to relieve medication access concerns  Interventions: . Comprehensive medication review completed; medication list updated in electronic medical record.  Larna Daughters by [employer] and Toujeo by [employer]: Patient meets income/3%out of pocket spend criteria for this medication's patient assistance program. Reviewed application process. Patient will provide proof of income, out of pocket spend report, and will sign application. Will collaborate with primary care provider, [name] for their portion of application. Once completed, will submit to [employer] patient assistance program. . Patient agreeable to participate.  Marland Kitchen Application filled out for LIS/Extra Help with patient on 12/27/18-->patient approved for FULL LIS/extra help from Medicare.  Her RX will be $3.40 (64-monthsupply GENERIC copay) and $8.95 (360-monthupply BRAND name copay). . Goal met.  Patient verbalizes understanding.  [facility] [place] [facility]pplies.  Patient Self Care Activities:  . Patient will provide necessary portions of application   Please see past updates related to this goal by clicking on the "Past Updates" button in the selected goal      . I would like to continue to optimize my medication managment of my chronic conditions (pt-stated)       Current Barriers:  . Diabetes: T2DM; most recent A1c 6.8% on 11/08/18  . Current antihyperglycemic regimen: Metformin, Toujeo 32 units qHS (sample given) o Ozempic '1mg'$  weekly (NOT taking Ozempic  due to side effects) . denies hypoglycemic symptoms;denies hyperglycemic symptoms . Current exercise: walking . Current blood glucose readings: FBG<130 . Cardiovascular risk reduction: o Current hypertensive regimen: amlodipine, edarbi (gave samples), hctz - BP was 150/70 (BP was 146/70 at past visit)-counseled patient to practice low salt diet, avoid soft drinks (caffeine).  Patient has been unable to check.  Encouraged her to come to office to have checked. o Current hyperlipidemia regimen: rosuvastatin '20mg'$  daily (last filled 11/02/18 for 90DS)  Pharmacist Clinical Goal(s):  . OMarland Kitchener the next 90 days, patient with work with PharmD and primary care provider to address optimized medication management of chronic conditions  Interventions: . Comprehensive medication review performed, medication list updated in electronic medical record . Comprehensive medication review performed.  Reviewed medication fill history via insurance claims data confirming patient appears compliant with having her medications filled on time as prescribed by provider. . Reviewed & discussed the following diabetes-related information with patient: o Continue checking blood sugars as directed o Follow ADA recommended "diabetes-friendly" diet  (reviewed healthy snack/food options) o Reviewed medication purpose/side effects--> patient complains about GI symptoms from [place] she has self-discontinued it  Patient Self Care Activities:  . Patient will check blood glucose daily, document, and provide at future appointments . Patient will focus on medication adherence by continuing to take medications as prescribed. . Patient will take medications as prescribed . Patient will contact provider with any episodes of hypoglycemia . Patient will report any questions or concerns to provider   Please see past updates related to this goal by clicking on the "Past Updates" button in  the selected goal         The patient  verbalized understanding of instructions provided today and declined a print copy of patient instruction materials.   The care management team will reach out to the patient again over the next 30 days.   SIGNATURE Regina Eck, PharmD, BCPS Clinical Pharmacist, [facility] [place]: [phone]

## 2019-01-22 ENCOUNTER — Telehealth: Payer: Self-pay

## 2019-01-22 ENCOUNTER — Other Ambulatory Visit: Payer: Self-pay

## 2019-01-22 MED ORDER — TOUJEO SOLOSTAR 300 UNIT/ML ~~LOC~~ SOPN: 32.0000 [IU] | PEN_INJECTOR | Freq: Every day | SUBCUTANEOUS | 2 refills | Status: DC

## 2019-01-23 DIAGNOSIS — M2560 Stiffness of unspecified joint, not elsewhere classified: Secondary | ICD-10-CM | POA: Diagnosis not present

## 2019-01-23 DIAGNOSIS — M25562 Pain in left knee: Secondary | ICD-10-CM | POA: Diagnosis not present

## 2019-01-23 DIAGNOSIS — M6281 Muscle weakness (generalized): Secondary | ICD-10-CM | POA: Diagnosis not present

## 2019-01-23 DIAGNOSIS — G8929 Other chronic pain: Secondary | ICD-10-CM | POA: Diagnosis not present

## 2019-01-23 DIAGNOSIS — R262 Difficulty in walking, not elsewhere classified: Secondary | ICD-10-CM | POA: Diagnosis not present

## 2019-01-25 ENCOUNTER — Emergency Department (HOSPITAL_COMMUNITY)
Admission: EM | Admit: 2019-01-25 | Discharge: 2019-01-25 | Disposition: A | Discharge status: Home / Self Care | Payer: Medicare Other | Attending: Emergency Medicine | Admitting: Emergency Medicine

## 2019-01-25 ENCOUNTER — Encounter (HOSPITAL_COMMUNITY): Payer: Self-pay | Admitting: *Deleted

## 2019-01-25 ENCOUNTER — Other Ambulatory Visit: Payer: Self-pay

## 2019-01-25 DIAGNOSIS — R8290 Unspecified abnormal findings in urine: Secondary | ICD-10-CM

## 2019-01-25 DIAGNOSIS — R109 Unspecified abdominal pain: Secondary | ICD-10-CM

## 2019-01-25 DIAGNOSIS — Z79899 Other long term (current) drug therapy: Secondary | ICD-10-CM | POA: Insufficient documentation

## 2019-01-25 DIAGNOSIS — E119 Type 2 diabetes mellitus without complications: Secondary | ICD-10-CM | POA: Insufficient documentation

## 2019-01-25 DIAGNOSIS — Z7982 Long term (current) use of aspirin: Secondary | ICD-10-CM | POA: Insufficient documentation

## 2019-01-25 DIAGNOSIS — I10 Essential (primary) hypertension: Secondary | ICD-10-CM | POA: Diagnosis not present

## 2019-01-25 DIAGNOSIS — Z87891 Personal history of nicotine dependence: Secondary | ICD-10-CM | POA: Diagnosis not present

## 2019-01-25 DIAGNOSIS — Z794 Long term (current) use of insulin: Secondary | ICD-10-CM | POA: Insufficient documentation

## 2019-01-25 LAB — COMPREHENSIVE METABOLIC PANEL
ALT: 22 U/L (ref 0–44)
AST: 18 U/L (ref 15–41)
Albumin: 3.7 g/dL (ref 3.5–5.0)
Alkaline Phosphatase: 133 U/L — ABNORMAL HIGH (ref 38–126)
Anion gap: 9 (ref 5–15)
BUN: 13 mg/dL (ref 8–23)
CO2: 24 mmol/L (ref 22–32)
Calcium: 9.2 mg/dL (ref 8.9–10.3)
Chloride: 105 mmol/L (ref 98–111)
Creatinine, Ser: 0.99 mg/dL (ref 0.44–1.00)
GFR calc Af Amer: >60 mL/min (ref >60)
GFR calc non Af Amer: 60 mL/min — ABNORMAL LOW (ref >60)
Glucose, Bld: 144 mg/dL — ABNORMAL HIGH (ref 70–99)
Potassium: 4.2 mmol/L (ref 3.5–5.1)
Sodium: 138 mmol/L (ref 135–145)
Total Bilirubin: 0.5 mg/dL (ref 0.3–1.2)
Total Protein: 7.3 g/dL (ref 6.5–8.1)

## 2019-01-25 LAB — CBC WITH DIFFERENTIAL/PLATELET
Abs Immature Granulocytes: 0.02 10*3/uL (ref 0.00–0.07)
Basophils Absolute: 0 10*3/uL (ref 0.0–0.1)
Basophils Relative: 1 %
Eosinophils Absolute: 0.1 10*3/uL (ref 0.0–0.5)
Eosinophils Relative: 2 %
HCT: 35.3 % — ABNORMAL LOW (ref 36.0–46.0)
Hemoglobin: 11.3 g/dL — ABNORMAL LOW (ref 12.0–15.0)
Immature Granulocytes: 0 %
Lymphocytes Relative: 33 %
Lymphs Abs: 2.1 10*3/uL (ref 0.7–4.0)
MCH: 28.5 pg (ref 26.0–34.0)
MCHC: 32 g/dL (ref 30.0–36.0)
MCV: 89.1 fL (ref 80.0–100.0)
Monocytes Absolute: 1 10*3/uL (ref 0.1–1.0)
Monocytes Relative: 15 %
Neutro Abs: 3.2 10*3/uL (ref 1.7–7.7)
Neutrophils Relative %: 49 %
Platelets: 257 10*3/uL (ref 150–400)
RBC: 3.96 MIL/uL (ref 3.87–5.11)
RDW: 14.3 % (ref 11.5–15.5)
WBC: 6.5 10*3/uL (ref 4.0–10.5)
nRBC: 0 % (ref 0.0–0.2)

## 2019-01-25 LAB — URINALYSIS, ROUTINE W REFLEX MICROSCOPIC
Bacteria, UA: NONE SEEN
Bilirubin Urine: NEGATIVE
Glucose, UA: NEGATIVE mg/dL
Hgb urine dipstick: NEGATIVE
Ketones, ur: NEGATIVE mg/dL
Nitrite: NEGATIVE
Protein, ur: NEGATIVE mg/dL
Specific Gravity, Urine: 1.008 (ref 1.005–1.030)
pH: 5 (ref 5.0–8.0)

## 2019-01-25 MED ORDER — CEPHALEXIN 500 MG PO CAPS: 500.0000 mg | ORAL_CAPSULE | Freq: Two times a day (BID) | ORAL | 0 refills | Status: DC

## 2019-01-25 MED ORDER — HYDROCODONE-ACETAMINOPHEN 5-325 MG PO TABS
2.0000 | ORAL_TABLET | Freq: Once | ORAL | Status: AC
  Administered 2019-01-25: 2 via ORAL
  Filled 2019-01-25: qty 2

## 2019-01-25 MED ORDER — IBUPROFEN 800 MG PO TABS: 800.0000 mg | ORAL_TABLET | Freq: Three times a day (TID) | ORAL | 0 refills | Status: DC

## 2019-01-25 MED ORDER — CYCLOBENZAPRINE HCL 10 MG PO TABS: 10.0000 mg | ORAL_TABLET | Freq: Two times a day (BID) | ORAL | 0 refills | Status: DC | PRN

## 2019-01-25 NOTE — ED Triage Notes (Signed)
Pt with right sided flank pain, radiates to the abdomen and down her right leg since Tuesday. Denies any urinary symptoms. She has had nausea. No fevers.

## 2019-01-25 NOTE — ED Provider Notes (Signed)
[facility] DEPT Provider Note   CSN: [id number] Arrival date & time: 01/25/19  0049     History   Chief Complaint Chief Complaint  Patient presents with  . Flank Pain    HPI Leslie Davis is a 63 y.o. female.     Patient presents to the emergency department with a chief complaint of right-sided flank pain.  She states symptoms started a day or so ago.  She denies any fever, chills, dysuria, or hematuria.  Denies any nausea, vomiting, or diarrhea.  She states that the pain is worsened with movement and palpation.  States pain radiates from her right flank to her right leg.  Denies any weakness, numbness, or tingling.  Denies any bowel or bladder incontinence.  Denies any injuries.  Denies any successful treatments prior to arrival.  The history is provided by the patient. No language interpreter was used.    Past Medical History:  Diagnosis Date  . DM (diabetes mellitus) ([place])   . Fatty liver disease, nonalcoholic   . Gastritis   . Gastroparesis   . GERD (gastroesophageal reflux disease)   . Hiatal hernia   . Hiatal hernia   . HLD (hyperlipidemia)   . HTN (hypertension)   . Renal calculus     Patient Active Problem List   Diagnosis Date Noted  . Erosive gastritis 11/16/2017  . Fatty liver 11/16/2017  . Abdominal pain 12/11/2012  . Early satiety 12/11/2012  . Hiatal hernia   . Hx of cholecystectomy 02/05/2011  . Flu-like symptoms 02/05/2011  . CONSTIPATION 08/27/2008  . FATTY LIVER DISEASE 05/07/2008  . Nausea with vomiting 04/23/2008  . EPIGASTRIC PAIN 04/23/2008  . RENAL CALCULUS 04/22/2008  . DIABETES MELLITUS II, UNCOMPLICATED [id number]  . HYPERLIPIDEMIA 05/19/2006  . HYPERTENSION, BENIGN SYSTEMIC 05/19/2006  . GASTROESOPHAGEAL REFLUX, NO ESOPHAGITIS 05/19/2006  . AMENORRHEA 05/19/2006    Past Surgical History:  Procedure Laterality Date  . CHOLECYSTECTOMY    . COLONOSCOPY    . TUBAL LIGATION    . UPPER  GASTROINTESTINAL ENDOSCOPY       OB History   No obstetric history on file.      Home Medications    Prior to Admission medications   Medication Sig Start Date End Date Taking? Authorizing Provider  amLODipine (NORVASC) 10 MG tablet TAKE 1 TABLET ONCE DAILY. 12/01/18   [name], MD  aspirin EC 81 MG tablet Take 81 mg by mouth daily.    [provider]  cephALEXin (KEFLEX) 500 MG capsule Take 1 capsule (500 mg total) by mouth 2 (two) times daily. 01/25/19   Montine Circle, PA-C  cyclobenzaprine (FLEXERIL) 10 MG tablet Take 1 tablet (10 mg total) by mouth 2 (two) times daily as needed for muscle spasms. 01/25/19   Montine Circle, PA-C  EDARBI 80 MG TABS TAKE 1 TABLET EACH DAY. 11/02/18   [name], MD  ferrous gluconate (FERGON) 324 MG tablet TAKE 1 TABLET BY MOUTH TWICE DAILY WITH A MEAL. 11/16/18   Mansouraty, Telford Nab., MD  hydrochlorothiazide (HYDRODIURIL) 12.5 MG tablet TAKE 1 TABLET ONCE DAILY. 11/07/18   [name], MD  ibuprofen (ADVIL) 800 MG tablet Take 1 tablet (800 mg total) by mouth 3 (three) times daily. 01/25/19   Montine Circle, PA-C  Insulin Glargine, 1 Unit Dial, (TOUJEO SOLOSTAR) 300 UNIT/ML SOPN Inject 32 Units into the skin at bedtime. 01/22/19   [name], MD  Insulin Pen Needle 32G X 6 MM MISC Use with pen injectors  daily dx code: e11.9 12/26/18   [name], MD  metFORMIN (GLUCOPHAGE) 850 MG tablet Take 1 tablet (850 mg total) by mouth 2 (two) times daily with a meal. 12/26/18   [name], MD  OZEMPIC, 1 MG/DOSE, 2 MG/1.5ML SOPN INJECT 1MG  ONCE A WEEK IN THE ABDOMEN, THIGHS OR UPPER ARM ROTATING INJECTION SITES. Patient not taking: No sig reported 10/04/18   [name], MD  pantoprazole (PROTONIX) 40 MG tablet TAKE 1 TABLET ONCE DAILY. 01/11/19   Mansouraty, Telford Nab., MD  rosuvastatin (CRESTOR) 20 MG tablet Take 1 tablet (20 mg total) by mouth daily. 11/08/18   [name], MD  temazepam (RESTORIL) 30 MG capsule TAKE 1  CAPSULE AT BEDTIME AS NEEDED. 12/13/18   [name], MD    Family History Family History  Problem Relation Age of Onset  . Diabetes Mother   . Diabetes Maternal Grandmother   . Stomach cancer Maternal Aunt        X 2 aunts  . Hypertension Father   . Colon cancer Neg Hx   . Esophageal cancer Neg Hx   . Rectal cancer Neg Hx     Social History Social History   Tobacco Use  . Smoking status: Former Smoker    Packs/day: 0.25    Years: 1.00    Pack years: 0.25    Quit date: 09/10/1974    Years since quitting: 44.4  . Smokeless tobacco: Never Used  Substance Use Topics  . Alcohol use: No  . Drug use: No     Allergies   Patient has no known allergies.   Review of Systems Review of Systems  All other systems reviewed and are negative.    Physical Exam Updated Vital Signs BP (!) 158/86   Pulse 63   Temp 98.4 F (36.9 C) (Oral)   Resp 15   Ht 5\' 4"  (1.626 m)   Wt 104.3 kg   SpO2 95%   BMI 39.48 kg/m   Physical Exam Vitals signs and nursing note reviewed.  Constitutional:      General: She is not in acute distress.    Appearance: She is well-developed.  HENT:     Head: Normocephalic and atraumatic.  Eyes:     Conjunctiva/sclera: Conjunctivae normal.  Neck:     Musculoskeletal: Neck supple.  Cardiovascular:     Rate and Rhythm: Normal rate and regular rhythm.     Heart sounds: No murmur.  Pulmonary:     Effort: Pulmonary effort is normal. No respiratory distress.     Breath sounds: Normal breath sounds.  Abdominal:     Palpations: Abdomen is soft.     Tenderness: There is no abdominal tenderness.  Musculoskeletal:     Comments: Range of motion and strength bilateral lower extremities 5/5 Tenderness to palpation lower right lumbar paraspinal muscles No spine tenderness  Skin:    General: Skin is warm and dry.  Neurological:     Mental Status: She is alert and oriented to person, place, and time.     Comments: Normal gait  Psychiatric:         Mood and Affect: Mood normal.        Behavior: Behavior normal.      ED Treatments / Results  Labs (all labs ordered are listed, but only abnormal results are displayed) Labs Reviewed  URINALYSIS, ROUTINE W REFLEX MICROSCOPIC - Abnormal; Notable for the following components:      Result Value   Color, Urine STRAW (*)  Leukocytes,Ua MODERATE (*)    All other components within normal limits  CBC WITH DIFFERENTIAL/PLATELET - Abnormal; Notable for the following components:   Hemoglobin 11.3 (*)    HCT 35.3 (*)    All other components within normal limits  COMPREHENSIVE METABOLIC PANEL - Abnormal; Notable for the following components:   Glucose, Bld 144 (*)    Alkaline Phosphatase 133 (*)    GFR calc non Af Amer 60 (*)    All other components within normal limits    EKG None  Radiology No results found.  Procedures Procedures (including critical care time)  Medications Ordered in ED Medications  HYDROcodone-acetaminophen (NORCO/VICODIN) 5-325 MG per tablet 2 tablet (2 tablets Oral Given 01/25/19 0134)     Initial Impression / Assessment and Plan / ED Course  I have reviewed the triage vital signs and the nursing notes.  Pertinent labs & imaging results that were available during my care of the patient were reviewed by me and considered in my medical decision making (see chart for details).        Patient with right flank pain.  Pain is reproducible with palpation.  I suspect it is likely muscular.  She has had good improvement with her symptoms with pain medicine in the ED.  Will prescribe ibuprofen and Flexeril.  Urinalysis shows some white cells and leukocytes.  Will cover with Keflex.  Remaining labs reassuring.  Final Clinical Impressions(s) / ED Diagnoses   Final diagnoses:  Flank pain  Abnormal urinalysis    ED Discharge Orders         Ordered    cephALEXin (KEFLEX) 500 MG capsule  2 times daily     01/25/19 0242    ibuprofen (ADVIL) 800 MG tablet  3  times daily     01/25/19 0242    cyclobenzaprine (FLEXERIL) 10 MG tablet  2 times daily PRN     01/25/19 0242           Montine Circle, PA-C 01/25/19 0247    Palumbo, April, MD 01/25/19 [id number]

## 2019-01-26 ENCOUNTER — Ambulatory Visit: Payer: Self-pay

## 2019-01-26 DIAGNOSIS — N182 Chronic kidney disease, stage 2 (mild): Secondary | ICD-10-CM

## 2019-01-26 DIAGNOSIS — I10 Essential (primary) hypertension: Secondary | ICD-10-CM

## 2019-01-26 DIAGNOSIS — I129 Hypertensive chronic kidney disease with stage 1 through stage 4 chronic kidney disease, or unspecified chronic kidney disease: Secondary | ICD-10-CM

## 2019-01-26 DIAGNOSIS — E1122 Type 2 diabetes mellitus with diabetic chronic kidney disease: Secondary | ICD-10-CM

## 2019-01-29 NOTE — Chronic Care Management (AMB) (Signed)
Chronic Care Management   Initial Visit Note  01/26/2019 Name: Leslie Davis MRN: [id number] DOB: 03-02-1956  Referred by: [name], MD Reason for referral : Chronic Care Management (#2 [place])   Leslie Davis is a 63 y.o. year old female who is a primary care patient of [name], MD. The CCM team was consulted for assistance with chronic disease management and care coordination needs related to DMII and Urinary system  Review of patient status, including review of consultants reports, relevant laboratory and other test results, and collaboration with appropriate care team members and the patient's provider was performed as part of comprehensive patient evaluation and provision of chronic care management services.    SDOH (Social Determinants of Health) screening performed today: None. See Care Plan for related entries.   Medications: Outpatient Encounter Medications as of 01/26/2019  Medication Sig   amLODipine (NORVASC) 10 MG tablet TAKE 1 TABLET ONCE DAILY.   aspirin EC 81 MG tablet Take 81 mg by mouth daily.   cephALEXin (KEFLEX) 500 MG capsule Take 1 capsule (500 mg total) by mouth 2 (two) times daily.   cyclobenzaprine (FLEXERIL) 10 MG tablet Take 1 tablet (10 mg total) by mouth 2 (two) times daily as needed for muscle spasms.   EDARBI 80 MG TABS TAKE 1 TABLET EACH DAY.   ferrous gluconate (FERGON) 324 MG tablet TAKE 1 TABLET BY MOUTH TWICE DAILY WITH A MEAL.   hydrochlorothiazide (HYDRODIURIL) 12.5 MG tablet TAKE 1 TABLET ONCE DAILY.   ibuprofen (ADVIL) 800 MG tablet Take 1 tablet (800 mg total) by mouth 3 (three) times daily.   Insulin Glargine, 1 Unit Dial, (TOUJEO SOLOSTAR) 300 UNIT/ML SOPN Inject 32 Units into the skin at bedtime.   Insulin Pen Needle 32G X 6 MM MISC Use with pen injectors daily dx code: e11.9   metFORMIN (GLUCOPHAGE) 850 MG tablet Take 1 tablet (850 mg total) by mouth 2 (two) times daily with a  meal.   OZEMPIC, 1 MG/DOSE, 2 MG/1.5ML SOPN INJECT 1MG  ONCE A WEEK IN THE ABDOMEN, THIGHS OR UPPER ARM ROTATING INJECTION SITES. (Patient not taking: No sig reported)   pantoprazole (PROTONIX) 40 MG tablet TAKE 1 TABLET ONCE DAILY.   rosuvastatin (CRESTOR) 20 MG tablet Take 1 tablet (20 mg total) by mouth daily.   temazepam (RESTORIL) 30 MG capsule TAKE 1 CAPSULE AT BEDTIME AS NEEDED.   No facility-administered encounter medications on file as of 01/26/2019.      Objective:  Lab Results  Component Value Date   HGBA1C 6.8 (H) 11/08/2018   HGBA1C 6.6 (H) 06/13/2018   HGBA1C 7.0 (H) 03/07/2018   Lab Results  Component Value Date   LDLCALC 77 06/13/2018   CREATININE 0.99 01/25/2019   BP Readings from Last 3 Encounters:  01/25/19 (!) 164/64  11/08/18 (!) 146/70  10/11/18 139/64    Goals Addressed      Patient Stated    "I went to the ED for a UTI" (pt-stated)       Current Barriers:   Knowledge Deficits related to treatment management of Impaired Urinary Elimination   ED visit for UTI completed on 01/25/19  Nurse Case Manager Clinical Goal(s):   Over the next 14 days, patient will have completed her full course of antibiotics as prescribed for UTI  Over the next 30 days, patient will demonstrate a decrease in UTI exacerbations as evidenced by patient will experience no s/s suggestive of UTI  Over the next 8  days, patient will be able to verbalize increased knowledge of s/s suggestive of UTI and know when to seek medical attention  CCM RN CM Interventions:  01/26/19 call completed with patient    Evaluation of current treatment plan related to UTI and patient's adherence to plan as established by provider.  Provided education to patient re: s/s of UTI, discussed importance of notifying the PCP and or CCM team as soon as the first symptoms are noticed in order to seek early treatment; educated patient on the importance of drinking plenty of water, emptying bladder  once the urge is noticed and to keep her PCP well informed of persistent Impaired Urinary Elimination   Reviewed medications with patient and discussed the importance of completing the full course of antibiotics as directed  Discussed plans with patient for ongoing care management follow up and provided patient with direct contact information for care management team  Patient Self Care Activities:   Self administers medications as prescribed  Attends all scheduled provider appointments  Calls pharmacy for medication refills  Performs ADL's independently  Performs IADL's independently  Calls provider office for new concerns or questions   Initial goal documentation        "I would like to better manage my Diabetes" (pt-stated)       Current Barriers:   Knowledge Deficits related to diabetes disease process and Self Health Management   Nurse Case Manager Clinical Goal(s):   Over the next 90 days, patient will work with the CCM team to address needs related to education and support to improve Self management of DM  CCM RN CM Interventions:  01/26/19 call completed with patient    Evaluation of current treatment plan related to Diabetes and patient's adherence to plan as established by provider.  Provided education to patient re: most recent A1C of 6.8 obtained on 11/08/18; discussed target ranges; educated patient on Self Health management interventions to help lower A1C; including adherence to following a Diabetic friendly diet, implementing daily exercise in her routine, discussed per ADA recommendations 150 min per week; adherence to taking her prescribed medications exactly as prescribed and managing her weight to stay within recommended BMI for best overall health      Reviewed medications with patient and discussed indication, dosage and frequency of Toujeo and Metformin for treatment of DM; discussed patient is having persistent GI symptoms, specifically loose stools  and abdominal pain; discussed she would like to speak with the embedded Pharm D for recommendations  Collaborated with embedded Pharm D Lottie Dawson  regarding patient's request to discuss noted SE to Metformin; advised patient needs an Rx for a new glucometer; One touch meter is broken and she is currently not able to Self monitor her CBG's  Discussed plans with patient for ongoing care management follow up and provided patient with direct contact information for care management team  Provided patient with printed educational materials related to Diabetes Management, s/s hypo/hyperglycemia, Meal Planning using the Plate Method, Diabetes Zone Management Tool  Advised patient, providing education and rationale, to check cbg daily before meals and record, calling the CCM team for findings outside established parameters.  <80 and or >200  Patient Self Care Activities:   Self administers medications as prescribed  Attends all scheduled provider appointments  Calls pharmacy for medication refills  Attends church or other social activities  Performs ADL's independently  Performs IADL's independently  Calls provider office for new concerns or questions   Initial goal documentation  Plan:   Telephone follow up appointment with care management team member scheduled for:02/12/19  Barb Merino, RN, BSN, CCM Care Management Coordinator [facility]  Direct Phone: [phone]

## 2019-01-29 NOTE — Patient Instructions (Signed)
Visit Information  Goals Addressed      Patient Stated   . "I went to the ED for a UTI" (pt-stated)       Current Barriers:  Marland Kitchen Knowledge Deficits related to treatment management of Impaired Urinary Elimination  . ED visit for UTI completed on 01/25/19  Nurse Case Manager Clinical Goal(s):  Marland Kitchen Over the next 14 days, patient will have completed her full course of antibiotics as prescribed for UTI . Over the next 30 days, patient will demonstrate a decrease in UTI exacerbations as evidenced by patient will experience no s/s suggestive of UTI . Over the next 60 days, patient will be able to verbalize increased knowledge of s/s suggestive of UTI and know when to seek medical attention  CCM RN CM Interventions:  01/26/19 call completed with patient   . Evaluation of current treatment plan related to UTI and patient's adherence to plan as established by provider. . Provided education to patient re: s/s of UTI, discussed importance of notifying the PCP and or CCM team as soon as the first symptoms are noticed in order to seek early treatment; educated patient on the importance of drinking plenty of water, emptying bladder once the urge is noticed and to keep her PCP well informed of persistent Impaired Urinary Elimination  . Reviewed medications with patient and discussed the importance of completing the full course of antibiotics as directed . Discussed plans with patient for ongoing care management follow up and provided patient with direct contact information for care management team  Patient Self Care Activities:  . Self administers medications as prescribed . Attends all scheduled provider appointments . Calls pharmacy for medication refills . Performs ADL's independently . Performs IADL's independently . Calls provider office for new concerns or questions .  Initial goal documentation     . "I would like to better manage my Diabetes" (pt-stated)       Current Barriers:  Marland Kitchen Knowledge  Deficits related to diabetes disease process and Self Health Management   Nurse Case Manager Clinical Goal(s):  Marland Kitchen Over the next 90 days, patient will work with the CCM team to address needs related to education and support to improve Self management of DM  CCM RN CM Interventions:  01/26/19 call completed with patient   . Evaluation of current treatment plan related to Diabetes and patient's adherence to plan as established by provider. . Provided education to patient re: most recent A1C of 6.8 obtained on 11/08/18; discussed target ranges; educated patient on Self Health management interventions to help lower A1C; including adherence to following a Diabetic friendly diet, implementing daily exercise in her routine, discussed per ADA recommendations 150 min per week; adherence to taking her prescribed medications exactly as prescribed and managing her weight to stay within recommended BMI for best overall health     . Reviewed medications with patient and discussed indication, dosage and frequency of Toujeo and Metformin for treatment of DM; discussed patient is having persistent GI symptoms, specifically loose stools and abdominal pain; discussed she would like to speak with the embedded Pharm D for recommendations . Collaborated with embedded Pharm D Lottie Dawson  regarding patient's request to discuss noted SE to Metformin; advised patient needs an Rx for a new glucometer; One touch meter is broken and she is currently not able to Self monitor her CBG's . Discussed plans with patient for ongoing care management follow up and provided patient with direct contact information for care management team . Provided  patient with printed educational materials related to Diabetes Management, s/s hypo/hyperglycemia, Meal Planning using the Plate Method, Diabetes Zone Management Tool . Advised patient, providing education and rationale, to check cbg daily before meals and record, calling the CCM team for  findings outside established parameters.  <80 and or >200  Patient Self Care Activities:  . Self administers medications as prescribed . Attends all scheduled provider appointments . Calls pharmacy for medication refills . Attends church or other social activities . Performs ADL's independently . Performs IADL's independently . Calls provider office for new concerns or questions  Initial goal documentation       The patient verbalized understanding of instructions provided today and declined a print copy of patient instruction materials.   Telephone follow up appointment with care management team member scheduled for: 02/12/19  Barb Merino, RN, BSN, CCM Care Management Coordinator [place] [facility]  Direct Phone: [phone]

## 2019-01-30 DIAGNOSIS — R262 Difficulty in walking, not elsewhere classified: Secondary | ICD-10-CM | POA: Diagnosis not present

## 2019-01-30 DIAGNOSIS — G8929 Other chronic pain: Secondary | ICD-10-CM | POA: Diagnosis not present

## 2019-01-30 DIAGNOSIS — M25562 Pain in left knee: Secondary | ICD-10-CM | POA: Diagnosis not present

## 2019-01-30 DIAGNOSIS — M6281 Muscle weakness (generalized): Secondary | ICD-10-CM | POA: Diagnosis not present

## 2019-01-30 DIAGNOSIS — M2560 Stiffness of unspecified joint, not elsewhere classified: Secondary | ICD-10-CM | POA: Diagnosis not present

## 2019-01-31 DIAGNOSIS — M47812 Spondylosis without myelopathy or radiculopathy, cervical region: Secondary | ICD-10-CM | POA: Diagnosis not present

## 2019-02-06 ENCOUNTER — Other Ambulatory Visit: Payer: Self-pay | Admitting: Gastroenterology

## 2019-02-06 DIAGNOSIS — M25562 Pain in left knee: Secondary | ICD-10-CM | POA: Diagnosis not present

## 2019-02-06 DIAGNOSIS — R262 Difficulty in walking, not elsewhere classified: Secondary | ICD-10-CM | POA: Diagnosis not present

## 2019-02-06 DIAGNOSIS — G8929 Other chronic pain: Secondary | ICD-10-CM | POA: Diagnosis not present

## 2019-02-06 DIAGNOSIS — M2560 Stiffness of unspecified joint, not elsewhere classified: Secondary | ICD-10-CM | POA: Diagnosis not present

## 2019-02-06 DIAGNOSIS — M6281 Muscle weakness (generalized): Secondary | ICD-10-CM | POA: Diagnosis not present

## 2019-02-08 ENCOUNTER — Ambulatory Visit: Payer: Medicare HMO | Admitting: Internal Medicine

## 2019-02-08 ENCOUNTER — Encounter: Payer: Self-pay | Admitting: Internal Medicine

## 2019-02-08 ENCOUNTER — Other Ambulatory Visit: Payer: Self-pay

## 2019-02-08 ENCOUNTER — Ambulatory Visit (INDEPENDENT_AMBULATORY_CARE_PROVIDER_SITE_OTHER): Payer: Medicare Other | Admitting: Internal Medicine

## 2019-02-08 VITALS — BP 138/76 | HR 59 | Temp 97.6°F | Ht 64.0 in | Wt 225.0 lb

## 2019-02-08 DIAGNOSIS — E1122 Type 2 diabetes mellitus with diabetic chronic kidney disease: Secondary | ICD-10-CM | POA: Diagnosis not present

## 2019-02-08 DIAGNOSIS — I129 Hypertensive chronic kidney disease with stage 1 through stage 4 chronic kidney disease, or unspecified chronic kidney disease: Secondary | ICD-10-CM

## 2019-02-08 DIAGNOSIS — N182 Chronic kidney disease, stage 2 (mild): Secondary | ICD-10-CM

## 2019-02-08 DIAGNOSIS — Z794 Long term (current) use of insulin: Secondary | ICD-10-CM | POA: Diagnosis not present

## 2019-02-08 DIAGNOSIS — Z0000 Encounter for general adult medical examination without abnormal findings: Secondary | ICD-10-CM

## 2019-02-08 NOTE — Patient Instructions (Addendum)
Leslie Davis , Thank you for taking time to come for your Medicare Wellness Visit. I appreciate your ongoing commitment to your health goals. Please review the following plan we discussed and let me know if I can assist you in the future.   These are the goals we discussed: Goals    . "I went to the ED for a UTI" (pt-stated)     Current Barriers:  Marland Kitchen Knowledge Deficits related to treatment management of Impaired Urinary Elimination  . ED visit for UTI completed on 01/25/19  Nurse Case Manager Clinical Goal(s):  Marland Kitchen Over the next 14 days, patient will have completed her full course of antibiotics as prescribed for UTI Goal Met  . Over the next 30 days, patient will demonstrate a decrease in UTI exacerbations as evidenced by patient will experience no s/s suggestive of UTI  Goal Met . Over the next 60 days, patient will be able to verbalize increased knowledge of s/s suggestive of UTI and know when to seek medical attention   CCM RN CM Interventions:  02/12/19 call completed with patient   . Evaluation of current treatment plan related to UTI and patient's adherence to plan as established by provider. . Provided education to patient re: s/s of UTI, discussed importance of notifying the PCP and or CCM team as soon as the first symptoms are noticed in order to seek early treatment; educated patient on the importance of drinking plenty of water, emptying bladder once the urge is noticed and to keep her PCP well informed of persistent Impaired Urinary Elimination  . Reviewed medications with patient and discussed the importance of completing the full course of antibiotics as directed . Discussed plans with patient for ongoing care management follow up and provided patient with direct contact information for care management team  Patient Self Care Activities:  . Self administers medications as prescribed . Attends all scheduled provider appointments . Calls pharmacy for medication  refills . Performs ADL's independently . Performs IADL's independently . Calls provider office for new concerns or questions  Please see past updates related to this goal by clicking on the "Past Updates" button in the selected goal        . "I would like to better manage my Diabetes" (pt-stated)     Current Barriers:  Marland Kitchen Knowledge Deficits related to diabetes disease process and Self Health Management   Nurse Case Manager Clinical Goal(s):  Marland Kitchen Over the next 90 days, patient will work with the CCM team to address needs related to education and support to improve Self management of DM . New - 02/12/19 - Over the next 90 days, patient will lower her A1C <8.2  CCM RN CM Interventions:  02/12/19 call completed with patient   . Evaluation of current treatment plan related to Diabetes and patient's adherence to plan as established by provider. . Provided education to patient re: most recent A1C has increased from 6.8 to 8.2 obtained on 02/09/19; discussed target ranges; educated patient on Self Health management interventions to help lower A1C; including adherence to following a Diabetic friendly diet, implementing daily exercise in her routine, discussed per ADA recommendations 150 min per week; adherence to taking her prescribed medications exactly as prescribed and managing her weight to stay within recommended BMI for best overall health     . Reviewed medications with patient and discussed indication, dosage and frequency of Toujeo and Metformin for treatment of DM; discussed patient is having persistent GI symptoms, specifically loose stools and  abdominal pain . Collaborated with embedded Pharm D Lottie Dawson  regarding patient's request to discuss noted SE to Metformin; advised patient needs an Rx for a new glucometer; One touch meter is broken and she is currently not able to Self monitor her CBG's . Discussed plans with patient for ongoing care management follow up and provided patient with  direct contact information for care management team . Provided patient with printed educational materials related to Diabetes Management, s/s hypo/hyperglycemia, Meal Planning using the Plate Method, Diabetes Zone Management Tool . Advised patient, providing education and rationale, to check cbg daily before meals and record, calling the CCM team for findings outside established parameters.  <80 and or >200  Patient Self Care Activities:  . Self administers medications as prescribed . Attends all scheduled provider appointments . Calls pharmacy for medication refills . Attends church or other social activities . Performs ADL's independently . Performs IADL's independently . Calls provider office for new concerns or questions   Please see past updates related to this goal by clicking on the "Past Updates" button in the selected goal      . "I would like to work on lowering my Triglycerides" (pt-stated)     Current Barriers:  Marland Kitchen Knowledge Deficits related to Self health management of Hypertriglyceridemia  Nurse Case Manager Clinical Goal(s):  Marland Kitchen Over the next 60 days, patient will work with the CCM team and PCP to address needs related to disease management of Hyperlipidemia/ Hypertriglyceridemia . Over the next 90 days, patient will lower her Triglycerides to target range; <149  CCM RN CM Interventions:  02/12/19 call completed with patient   . Evaluation of current treatment plan related to treatment management of Hypertriglyceridemia and patient's adherence to plan as established by provider. . Provided education to patient re: disease process and treatment management including pharmacological and Self Health management through diet, exercise, and weight management; educated patient on potential complications if untreated    . Reviewed medications with patient and discussed indication, dosage and frequency of Rosuvastatin for the noted condition  . Discussed plans with patient for  ongoing care management follow up and provided patient with direct contact information for care management team . Provided patient with printed educational materials related to Triglycerides; Why Do They Matter  Patient Self Care Activities:  . Self administers medications as prescribed . Attends all scheduled provider appointments . Calls pharmacy for medication refills . Performs ADL's independently . Performs IADL's independently . Calls provider office for new concerns or questions  Initial goal documentation     . Exercise 150 min/wk Moderate Activity    . I would like to continue to optimize my medication managment of my chronic conditions (pt-stated)     Current Barriers:  . Diabetes: T2DM; most recent A1c 6.8% on 11/08/18  . Current antihyperglycemic regimen: Metformin, Toujeo 32 units qHS (sample given) o Ozempic '1mg'$  weekly (NOT taking Ozempic due to side effects) . denies hypoglycemic symptoms;denies hyperglycemic symptoms . Current exercise: walking . Current blood glucose readings: FBG<130 . Cardiovascular risk reduction: o Current hypertensive regimen: amlodipine, edarbi (gave samples), hctz - BP was 150/70 (BP was 146/70 at past visit)-counseled patient to practice low salt diet, avoid soft drinks (caffeine).  Patient has been unable to check.  Encouraged her to come to office to have checked. o Current hyperlipidemia regimen: rosuvastatin '20mg'$  daily (last filled 11/02/18 for 90DS)  Pharmacist Clinical Goal(s):  Marland Kitchen Over the next 90 days, patient with work with PharmD and primary  care provider to address optimized medication management of chronic conditions  Interventions: . Comprehensive medication review performed, medication list updated in electronic medical record . Comprehensive medication review performed.  Reviewed medication fill history via insurance claims data confirming patient appears compliant with having her medications filled on time as prescribed by  provider. . Reviewed & discussed the following diabetes-related information with patient: o Continue checking blood sugars as directed o Follow ADA recommended "diabetes-friendly" diet  (reviewed healthy snack/food options) o Reviewed medication purpose/side effects--> patient complains about GI symptoms from [place] and she has self-discontinued it  Patient Self Care Activities:  . Patient will check blood glucose daily, document, and provide at future appointments . Patient will focus on medication adherence by continuing to take medications as prescribed. . Patient will take medications as prescribed . Patient will contact provider with any episodes of hypoglycemia . Patient will report any questions or concerns to provider   Please see past updates related to this goal by clicking on the "Past Updates" button in the selected goal      . Weight (lb) < 200 lb (90.7 kg)     She would like to lose 15 pounds within the next six months.        This is a list of the screening recommended for you and due dates:  Health Maintenance  Topic Date Due  . Pap Smear  06/20/2008  . HIV Screening  11/08/2019*  . Pneumonia vaccines (1 of 2 - PCV13) 02/08/2020*  . Hemoglobin A1C  08/08/2019  . Eye exam for diabetics  11/03/2019  . Complete foot exam   11/08/2019  . Mammogram  11/01/2020  . Colon Cancer Screening  09/09/2021  . Tetanus Vaccine  03/27/2023  . Flu Shot  Completed  . DEXA scan (bone density measurement)  Completed  .  Hepatitis C: One time screening is recommended by Center for Disease Control  (CDC) for  adults born from [birth year] through 1967.   Completed  *Topic was postponed. The date shown is not the original due date.

## 2019-02-09 LAB — HEMOGLOBIN A1C
Est. average glucose Bld gHb Est-mCnc: 189 mg/dL
Hgb A1c MFr Bld: 8.2 % — ABNORMAL HIGH (ref 4.8–5.6)

## 2019-02-09 LAB — LIPID PANEL
Chol/HDL Ratio: 2.5 ratio (ref 0.0–4.4)
Cholesterol, Total: 162 mg/dL (ref 100–199)
HDL: 66 mg/dL (ref >39)
LDL Chol Calc (NIH): 64 mg/dL (ref 0–99)
Triglycerides: 196 mg/dL — ABNORMAL HIGH (ref 0–149)
VLDL Cholesterol Cal: 32 mg/dL (ref 5–40)

## 2019-02-12 ENCOUNTER — Other Ambulatory Visit: Payer: Self-pay

## 2019-02-12 ENCOUNTER — Ambulatory Visit (INDEPENDENT_AMBULATORY_CARE_PROVIDER_SITE_OTHER): Payer: Medicare Other

## 2019-02-12 ENCOUNTER — Telehealth: Payer: Self-pay

## 2019-02-12 DIAGNOSIS — Z794 Long term (current) use of insulin: Secondary | ICD-10-CM

## 2019-02-12 DIAGNOSIS — I129 Hypertensive chronic kidney disease with stage 1 through stage 4 chronic kidney disease, or unspecified chronic kidney disease: Secondary | ICD-10-CM | POA: Diagnosis not present

## 2019-02-12 DIAGNOSIS — E1122 Type 2 diabetes mellitus with diabetic chronic kidney disease: Secondary | ICD-10-CM | POA: Diagnosis not present

## 2019-02-12 DIAGNOSIS — N182 Chronic kidney disease, stage 2 (mild): Secondary | ICD-10-CM

## 2019-02-13 ENCOUNTER — Other Ambulatory Visit: Payer: Self-pay

## 2019-02-13 DIAGNOSIS — M6281 Muscle weakness (generalized): Secondary | ICD-10-CM | POA: Diagnosis not present

## 2019-02-13 DIAGNOSIS — M25562 Pain in left knee: Secondary | ICD-10-CM | POA: Diagnosis not present

## 2019-02-13 DIAGNOSIS — M2560 Stiffness of unspecified joint, not elsewhere classified: Secondary | ICD-10-CM | POA: Diagnosis not present

## 2019-02-13 DIAGNOSIS — R262 Difficulty in walking, not elsewhere classified: Secondary | ICD-10-CM | POA: Diagnosis not present

## 2019-02-13 DIAGNOSIS — G8929 Other chronic pain: Secondary | ICD-10-CM | POA: Diagnosis not present

## 2019-02-13 MED ORDER — ONETOUCH VERIO W/DEVICE KIT: PACK | 1 refills | Status: AC

## 2019-02-13 MED ORDER — ONETOUCH VERIO VI STRP: ORAL_STRIP | 3 refills | Status: DC

## 2019-02-13 MED ORDER — ONETOUCH DELICA LANCETS 33G MISC: 3 refills | Status: AC

## 2019-02-13 NOTE — Chronic Care Management (AMB) (Signed)
Chronic Care Management   Follow Up Note   02/12/2019 Name: Leslie Davis MRN: [id number] DOB: Dec 17, 1955  Referred by: [name], MD Reason for referral : Chronic Care Management (CCM RNCM Telephone Follow up )   Leslie Davis is a 63 y.o. year old female who is a primary care patient of [name], MD. The CCM team was consulted for assistance with chronic disease management and care coordination needs.    Review of patient status, including review of consultants reports, relevant laboratory and other test results, and collaboration with appropriate care team members and the patient's provider was performed as part of comprehensive patient evaluation and provision of chronic care management services.    SDOH (Social Determinants of Health) screening performed today: None. See Care Plan for related entries.   Placed outbound call to patient for a CCM RN CM follow up.    Outpatient Encounter Medications as of 02/12/2019  Medication Sig  . Insulin Glargine, 1 Unit Dial, (TOUJEO SOLOSTAR) 300 UNIT/ML SOPN Inject 32 Units into the skin at bedtime.  . metFORMIN (GLUCOPHAGE) 850 MG tablet Take 1 tablet (850 mg total) by mouth 2 (two) times daily with a meal.  . amLODipine (NORVASC) 10 MG tablet TAKE 1 TABLET ONCE DAILY.  Marland Kitchen aspirin EC 81 MG tablet Take 81 mg by mouth daily.  . cyclobenzaprine (FLEXERIL) 10 MG tablet Take 1 tablet (10 mg total) by mouth 2 (two) times daily as needed for muscle spasms. (Patient not taking: Reported on 02/08/2019)  . EDARBI 80 MG TABS TAKE 1 TABLET EACH DAY.  . ferrous gluconate (FERGON) 324 MG tablet TAKE 1 TABLET BY MOUTH TWICE DAILY WITH A MEAL.  . hydrochlorothiazide (HYDRODIURIL) 12.5 MG tablet TAKE 1 TABLET ONCE DAILY.  Marland Kitchen ibuprofen (ADVIL) 800 MG tablet Take 1 tablet (800 mg total) by mouth 3 (three) times daily. (Patient not taking: Reported on 02/08/2019)  . Insulin Pen Needle 32G X 6 MM MISC Use with pen injectors daily dx code: e11.9   . OZEMPIC, 1 MG/DOSE, 2 MG/1.5ML SOPN INJECT '1MG'$  ONCE A WEEK IN THE ABDOMEN, THIGHS OR UPPER ARM ROTATING INJECTION SITES. (Patient not taking: No sig reported)  . pantoprazole (PROTONIX) 40 MG tablet TAKE 1 TABLET ONCE DAILY.  . rosuvastatin (CRESTOR) 20 MG tablet Take 1 tablet (20 mg total) by mouth daily.  . temazepam (RESTORIL) 30 MG capsule TAKE 1 CAPSULE AT BEDTIME AS NEEDED.   No facility-administered encounter medications on file as of 02/12/2019.      Goals Addressed      Patient Stated   . "I went to the ED for a UTI" (pt-stated)       Current Barriers:  Marland Kitchen Knowledge Deficits related to treatment management of Impaired Urinary Elimination  . ED visit for UTI completed on 01/25/19  Nurse Case Manager Clinical Goal(s):  Marland Kitchen Over the next 14 days, patient will have completed her full course of antibiotics as prescribed for UTI Goal Met  . Over the next 30 days, patient will demonstrate a decrease in UTI exacerbations as evidenced by patient will experience no s/s suggestive of UTI  Goal Met . Over the next 60 days, patient will be able to verbalize increased knowledge of s/s suggestive of UTI and know when to seek medical attention   CCM RN CM Interventions:  02/12/19 call completed with patient   . Evaluation of current treatment plan related to UTI and patient's adherence to plan as established by provider. . Provided education  to patient re: s/s of UTI, discussed importance of notifying the PCP and or CCM team as soon as the first symptoms are noticed in order to seek early treatment; educated patient on the importance of drinking plenty of water, emptying bladder once the urge is noticed and to keep her PCP well informed of persistent Impaired Urinary Elimination  . Reviewed medications with patient and discussed the importance of completing the full course of antibiotics as directed . Discussed plans with patient for ongoing care management follow up and provided patient with  direct contact information for care management team  Patient Self Care Activities:  . Self administers medications as prescribed . Attends all scheduled provider appointments . Calls pharmacy for medication refills . Performs ADL's independently . Performs IADL's independently . Calls provider office for new concerns or questions  Please see past updates related to this goal by clicking on the "Past Updates" button in the selected goal      . "I would like to better manage my Diabetes" (pt-stated)       Current Barriers:  Marland Kitchen Knowledge Deficits related to diabetes disease process and Self Health Management   Nurse Case Manager Clinical Goal(s):  Marland Kitchen Over the next 90 days, patient will work with the CCM team to address needs related to education and support to improve Self management of DM . New - 02/12/19 - Over the next 90 days, patient will lower her A1C <8.2  CCM RN CM Interventions:  02/12/19 call completed with patient   . Evaluation of current treatment plan related to Diabetes and patient's adherence to plan as established by provider. . Provided education to patient re: most recent A1C has increased from 6.8 to 8.2 obtained on 02/09/19; discussed target ranges; educated patient on Self Health management interventions to help lower A1C; including adherence to following a Diabetic friendly diet, implementing daily exercise in her routine, discussed per ADA recommendations 150 min per week; adherence to taking her prescribed medications exactly as prescribed and managing her weight to stay within recommended BMI for best overall health     . Reviewed medications with patient and discussed indication, dosage and frequency of Toujeo and Metformin for treatment of DM; discussed patient is having persistent GI symptoms, specifically loose stools and abdominal pain . Collaborated with embedded Pharm D Lottie Dawson  regarding patient's request to discuss noted SE to Metformin; advised patient  needs an Rx for a new glucometer; One touch meter is broken and she is currently not able to Self monitor her CBG's . Discussed plans with patient for ongoing care management follow up and provided patient with direct contact information for care management team . Provided patient with printed educational materials related to Diabetes Management, s/s hypo/hyperglycemia, Meal Planning using the Plate Method, Diabetes Zone Management Tool . Advised patient, providing education and rationale, to check cbg daily before meals and record, calling the CCM team for findings outside established parameters.  <80 and or >200  Patient Self Care Activities:  . Self administers medications as prescribed . Attends all scheduled provider appointments . Calls pharmacy for medication refills . Attends church or other social activities . Performs ADL's independently . Performs IADL's independently . Calls provider office for new concerns or questions   Please see past updates related to this goal by clicking on the "Past Updates" button in the selected goal         Telephone follow up appointment with care management team member scheduled for: 03/30/19  Barb Merino, RN, BSN, CCM Care Management Coordinator [facility]  Direct Phone: [phone]

## 2019-02-13 NOTE — Patient Instructions (Addendum)
Visit Information  Goals Addressed      Patient Stated   . "I went to the ED for a UTI" (pt-stated)       Current Barriers:  Marland Kitchen Knowledge Deficits related to treatment management of Impaired Urinary Elimination  . ED visit for UTI completed on 01/25/19  Nurse Case Manager Clinical Goal(s):  Marland Kitchen Over the next 14 days, patient will have completed her full course of antibiotics as prescribed for UTI Goal Met  . Over the next 30 days, patient will demonstrate a decrease in UTI exacerbations as evidenced by patient will experience no s/s suggestive of UTI  Goal Met . Over the next 60 days, patient will be able to verbalize increased knowledge of s/s suggestive of UTI and know when to seek medical attention   CCM RN CM Interventions:  02/12/19 call completed with patient   . Evaluation of current treatment plan related to UTI and patient's adherence to plan as established by provider. . Provided education to patient re: s/s of UTI, discussed importance of notifying the PCP and or CCM team as soon as the first symptoms are noticed in order to seek early treatment; educated patient on the importance of drinking plenty of water, emptying bladder once the urge is noticed and to keep her PCP well informed of persistent Impaired Urinary Elimination  . Reviewed medications with patient and discussed the importance of completing the full course of antibiotics as directed . Discussed plans with patient for ongoing care management follow up and provided patient with direct contact information for care management team  Patient Self Care Activities:  . Self administers medications as prescribed . Attends all scheduled provider appointments . Calls pharmacy for medication refills . Performs ADL's independently . Performs IADL's independently . Calls provider office for new concerns or questions  Please see past updates related to this goal by clicking on the "Past Updates" button in the selected goal       . "I would like to better manage my Diabetes" (pt-stated)       Current Barriers:  Marland Kitchen Knowledge Deficits related to diabetes disease process and Self Health Management   Nurse Case Manager Clinical Goal(s):  Marland Kitchen Over the next 90 days, patient will work with the CCM team to address needs related to education and support to improve Self management of DM . New - 02/12/19 - Over the next 90 days, patient will lower her A1C <8.2  CCM RN CM Interventions:  02/12/19 call completed with patient   . Evaluation of current treatment plan related to Diabetes and patient's adherence to plan as established by provider. . Provided education to patient re: most recent A1C has increased from 6.8 to 8.2 obtained on 02/09/19; discussed target ranges; educated patient on Self Health management interventions to help lower A1C; including adherence to following a Diabetic friendly diet, implementing daily exercise in her routine, discussed per ADA recommendations 150 min per week; adherence to taking her prescribed medications exactly as prescribed and managing her weight to stay within recommended BMI for best overall health     . Reviewed medications with patient and discussed indication, dosage and frequency of Toujeo and Metformin for treatment of DM; discussed patient is having persistent GI symptoms, specifically loose stools and abdominal pain . Collaborated with embedded Pharm D Lottie Dawson  regarding patient's request to discuss noted SE to Metformin; advised patient needs an Rx for a new glucometer; One touch meter is broken and she is currently not  able to Self monitor her CBG's . Discussed plans with patient for ongoing care management follow up and provided patient with direct contact information for care management team . Provided patient with printed educational materials related to Diabetes Management, s/s hypo/hyperglycemia, Meal Planning using the Plate Method, Diabetes Zone Management Tool . Advised  patient, providing education and rationale, to check cbg daily before meals and record, calling the CCM team for findings outside established parameters.  <80 and or >200  Patient Self Care Activities:  . Self administers medications as prescribed . Attends all scheduled provider appointments . Calls pharmacy for medication refills . Attends church or other social activities . Performs ADL's independently . Performs IADL's independently . Calls provider office for new concerns or questions   Please see past updates related to this goal by clicking on the "Past Updates" button in the selected goal      . "I would like to work on lowering my Triglycerides" (pt-stated)       Current Barriers:  Marland Kitchen Knowledge Deficits related to Self health management of Hypertriglyceridemia  Nurse Case Manager Clinical Goal(s):  Marland Kitchen Over the next 60 days, patient will work with the CCM team and PCP to address needs related to disease management of Hyperlipidemia/ Hypertriglyceridemia . Over the next 90 days, patient will lower her Triglycerides to target range; <149  CCM RN CM Interventions:  02/12/19 call completed with patient   . Evaluation of current treatment plan related to treatment management of Hypertriglyceridemia and patient's adherence to plan as established by provider. . Provided education to patient re: disease process and treatment management including pharmacological and Self Health management through diet, exercise, and weight management; educated patient on potential complications if untreated    . Reviewed medications with patient and discussed indication, dosage and frequency of Rosuvastatin for the noted condition  . Discussed plans with patient for ongoing care management follow up and provided patient with direct contact information for care management team . Provided patient with printed educational materials related to Triglycerides; Why Do They Matter  Patient Self Care Activities:   . Self administers medications as prescribed . Attends all scheduled provider appointments . Calls pharmacy for medication refills . Performs ADL's independently . Performs IADL's independently . Calls provider office for new concerns or questions  Initial goal documentation        The patient verbalized understanding of instructions provided today and declined a print copy of patient instruction materials.   Telephone follow up appointment with care management team member scheduled for: 03/30/19  Barb Merino, RN, BSN, CCM Care Management Coordinator [facility]  Direct Phone: [phone]

## 2019-02-18 ENCOUNTER — Encounter: Payer: Self-pay | Admitting: Internal Medicine

## 2019-02-18 NOTE — Progress Notes (Signed)
Subjective:    Leslie Davis is a 63 y.o. female who presents for a Welcome to Medicare exam.   She presents today for her Welcome to Medicare Exam. She has no specific concerns or complaints at this time.   Diabetes She presents for her follow-up diabetic visit. She has type 2 diabetes mellitus. Her disease course has been stable. There are no hypoglycemic associated symptoms. Pertinent negatives for diabetes include no blurred vision and no chest pain. There are no hypoglycemic complications. Diabetic complications include nephropathy. Risk factors for coronary artery disease include diabetes mellitus, dyslipidemia, hypertension and post-menopausal.  Hypertension This is a chronic problem. The current episode started more than 1 year ago. The problem has been gradually improving since onset. Pertinent negatives include no blurred vision, chest pain, palpitations or shortness of breath.     Review of Systems Review of Systems  Constitutional: Negative.   HENT: Negative.   Eyes: Negative.  Negative for blurred vision.  Respiratory: Negative.  Negative for shortness of breath.   Cardiovascular: Negative.  Negative for chest pain and palpitations.  Gastrointestinal: Negative.   Genitourinary: Negative.   Musculoskeletal: Negative.   Skin: Negative.   Neurological: Negative.   Endo/Heme/Allergies: Negative.   Psychiatric/Behavioral: Negative.    Cardiac Risk Factors include: diabetes mellitus, htn, high cholesterol, obesity, sedentary lifestyle      Objective:    Today's Vitals   02/08/19 1422 02/08/19 1456  BP: 138/76   Pulse: (!) 59   Temp: 97.6 F (36.4 C)   TempSrc: Oral   Weight: 225 lb (102.1 kg)   Height: '5\' 4"'$  (1.626 m)   PainSc: 0-No pain 0-No pain  Body mass index is 38.62 kg/m.  Medications Outpatient Encounter Medications as of 02/08/2019  Medication Sig  . amLODipine (NORVASC) 10 MG tablet TAKE 1 TABLET ONCE DAILY.  Marland Kitchen aspirin EC 81 MG tablet Take  81 mg by mouth daily.  Marland Kitchen EDARBI 80 MG TABS TAKE 1 TABLET EACH DAY.  . ferrous gluconate (FERGON) 324 MG tablet TAKE 1 TABLET BY MOUTH TWICE DAILY WITH A MEAL.  . hydrochlorothiazide (HYDRODIURIL) 12.5 MG tablet TAKE 1 TABLET ONCE DAILY.  Marland Kitchen Insulin Glargine, 1 Unit Dial, (TOUJEO SOLOSTAR) 300 UNIT/ML SOPN Inject 32 Units into the skin at bedtime.  . Insulin Pen Needle 32G X 6 MM MISC Use with pen injectors daily dx code: e11.9  . metFORMIN (GLUCOPHAGE) 850 MG tablet Take 1 tablet (850 mg total) by mouth 2 (two) times daily with a meal.  . pantoprazole (PROTONIX) 40 MG tablet TAKE 1 TABLET ONCE DAILY.  . rosuvastatin (CRESTOR) 20 MG tablet Take 1 tablet (20 mg total) by mouth daily.  . temazepam (RESTORIL) 30 MG capsule TAKE 1 CAPSULE AT BEDTIME AS NEEDED.  Marland Kitchen cyclobenzaprine (FLEXERIL) 10 MG tablet Take 1 tablet (10 mg total) by mouth 2 (two) times daily as needed for muscle spasms. (Patient not taking: Reported on 02/08/2019)  . ibuprofen (ADVIL) 800 MG tablet Take 1 tablet (800 mg total) by mouth 3 (three) times daily. (Patient not taking: Reported on 02/08/2019)  . OZEMPIC, 1 MG/DOSE, 2 MG/1.5ML SOPN INJECT '1MG'$  ONCE A WEEK IN THE ABDOMEN, THIGHS OR UPPER ARM ROTATING INJECTION SITES. (Patient not taking: No sig reported)  . [DISCONTINUED] cephALEXin (KEFLEX) 500 MG capsule Take 1 capsule (500 mg total) by mouth 2 (two) times daily. (Patient not taking: Reported on 02/08/2019)   No facility-administered encounter medications on file as of 02/08/2019.      History:  Past Medical History:  Diagnosis Date  . DM (diabetes mellitus) ([place])   . Fatty liver disease, nonalcoholic   . Gastritis   . Gastroparesis   . GERD (gastroesophageal reflux disease)   . Hiatal hernia   . Hiatal hernia   . HLD (hyperlipidemia)   . HTN (hypertension)   . Renal calculus    Past Surgical History:  Procedure Laterality Date  . CHOLECYSTECTOMY    . COLONOSCOPY    . TUBAL LIGATION    . UPPER GASTROINTESTINAL  ENDOSCOPY      Family History  Problem Relation Age of Onset  . Diabetes Mother   . Diabetes Maternal Grandmother   . Stomach cancer Maternal Aunt        X 2 aunts  . Hypertension Father   . Colon cancer Neg Hx   . Esophageal cancer Neg Hx   . Rectal cancer Neg Hx    Social History   Occupational History  . Occupation: housekeeping  Tobacco Use  . Smoking status: Former Smoker    Packs/day: 0.25    Years: 1.00    Pack years: 0.25    Quit date: 09/10/1974    Years since quitting: 44.4  . Smokeless tobacco: Never Used  . Tobacco comment: she no longer smokes.   Substance and Sexual Activity  . Alcohol use: No  . Drug use: No  . Sexual activity: Not Currently    Tobacco Counseling Counseling given: No Comment: she no longer smokes.    Immunizations and Health Maintenance Immunization History  Administered Date(s) Administered  . DTaP 03/26/2013  . Fluad Quad(high Dose 65+) 11/08/2018  . Influenza, High Dose Seasonal PF 11/08/2018  . Influenza-Unspecified 11/30/2017  . Td 08/20/2005   Health Maintenance Due  Topic Date Due  . PAP SMEAR-Modifier  06/20/2008    Activities of Daily Living In your present state of health, do you have any difficulty performing the following activities: 02/08/2019 11/08/2018  Hearing? N N  Vision? Y Y  Comment - sometimes with reading, had eye exam last week. Rx was changed  Difficulty concentrating or making decisions? N N  Walking or climbing stairs? Y Y  Comment - has occ. knee pain, only with climbing steps  Dressing or bathing? N N  Doing errands, shopping? N N  Preparing Food and eating ? N N  Using the Toilet? N N  In the past six months, have you accidently leaked urine? N N  Do you have problems with loss of bowel control? Y N  Managing your Medications? N N  Managing your Finances? N N  Housekeeping or managing your Housekeeping? N N  Some recent data might be hidden    Physical Exam  Physical Exam   Constitutional: She is oriented to person, place, and time and well-developed, well-nourished, and in no distress. No distress.  Obese. She elected to stay fully clothed for exam.   HENT:  Head: Normocephalic and atraumatic.  Right Ear: External ear normal.  Left Ear: External ear normal.  Masked  Eyes: Pupils are equal, round, and reactive to light. Conjunctivae and EOM are normal.  Neck: Normal range of motion. Neck supple.  Cardiovascular: Normal rate, regular rhythm, normal heart sounds and intact distal pulses.  Pulmonary/Chest: Effort normal and breath sounds normal.  Breast exam not performed.   Abdominal: Soft. Bowel sounds are normal.  Obese.   Genitourinary:    Genitourinary Comments: Deferred    Musculoskeletal: Normal range of motion.  Neurological: She  is alert and oriented to person, place, and time.  Skin: She is not diaphoretic.  Psychiatric: Affect normal.  Nursing note and vitals reviewed.   Advanced Directives: Does Patient Have a Medical Advance Directive?: No Would patient like information on creating a medical advance directive?: Yes (ED - Information included in AVS)    Assessment:    This is a routine wellness examination for this patient .   Vision/Hearing screen  Hearing Screening   Method: Audiometry   '125Hz'$  '250Hz'$  '500Hz'$  '1000Hz'$  '2000Hz'$  '3000Hz'$  '4000Hz'$  '6000Hz'$  '8000Hz'$   Right ear:  '25 25 25 25  '$ Fail    Left ear:  '25 25 25 25  25      '$ Visual Acuity Screening   Right eye Left eye Both eyes  Without correction:     With correction: '20/30 20/30 20/30 '$    Dietary issues and exercise activities discussed:  Current Exercise Habits: Structured exercise class(patient said she goes to the gym), Type of exercise: walking;strength training/weights, Time (Minutes): 60, Frequency (Times/Week): 3, Weekly Exercise (Minutes/Week): 180, Intensity: Mild, Exercise limited by: cardiac condition(s)  Goals    . "I went to the ED for a UTI" (pt-stated)     Current  Barriers:  Marland Kitchen Knowledge Deficits related to treatment management of Impaired Urinary Elimination  . ED visit for UTI completed on 01/25/19  Nurse Case Manager Clinical Goal(s):  Marland Kitchen Over the next 14 days, patient will have completed her full course of antibiotics as prescribed for UTI Goal Met  . Over the next 30 days, patient will demonstrate a decrease in UTI exacerbations as evidenced by patient will experience no s/s suggestive of UTI  Goal Met . Over the next 60 days, patient will be able to verbalize increased knowledge of s/s suggestive of UTI and know when to seek medical attention   CCM RN CM Interventions:  02/12/19 call completed with patient   . Evaluation of current treatment plan related to UTI and patient's adherence to plan as established by provider. . Provided education to patient re: s/s of UTI, discussed importance of notifying the PCP and or CCM team as soon as the first symptoms are noticed in order to seek early treatment; educated patient on the importance of drinking plenty of water, emptying bladder once the urge is noticed and to keep her PCP well informed of persistent Impaired Urinary Elimination  . Reviewed medications with patient and discussed the importance of completing the full course of antibiotics as directed . Discussed plans with patient for ongoing care management follow up and provided patient with direct contact information for care management team  Patient Self Care Activities:  . Self administers medications as prescribed . Attends all scheduled provider appointments . Calls pharmacy for medication refills . Performs ADL's independently . Performs IADL's independently . Calls provider office for new concerns or questions  Please see past updates related to this goal by clicking on the "Past Updates" button in the selected goal        . "I would like to better manage my Diabetes" (pt-stated)     Current Barriers:  Marland Kitchen Knowledge Deficits related to  diabetes disease process and Self Health Management   Nurse Case Manager Clinical Goal(s):  Marland Kitchen Over the next 90 days, patient will work with the CCM team to address needs related to education and support to improve Self management of DM . New - 02/12/19 - Over the next 90 days, patient will lower her A1C <8.2  CCM RN  CM Interventions:  02/12/19 call completed with patient   . Evaluation of current treatment plan related to Diabetes and patient's adherence to plan as established by provider. . Provided education to patient re: most recent A1C has increased from 6.8 to 8.2 obtained on 02/09/19; discussed target ranges; educated patient on Self Health management interventions to help lower A1C; including adherence to following a Diabetic friendly diet, implementing daily exercise in her routine, discussed per ADA recommendations 150 min per week; adherence to taking her prescribed medications exactly as prescribed and managing her weight to stay within recommended BMI for best overall health     . Reviewed medications with patient and discussed indication, dosage and frequency of Toujeo and Metformin for treatment of DM; discussed patient is having persistent GI symptoms, specifically loose stools and abdominal pain . Collaborated with embedded Pharm D Lottie Dawson  regarding patient's request to discuss noted SE to Metformin; advised patient needs an Rx for a new glucometer; One touch meter is broken and she is currently not able to Self monitor her CBG's . Discussed plans with patient for ongoing care management follow up and provided patient with direct contact information for care management team . Provided patient with printed educational materials related to Diabetes Management, s/s hypo/hyperglycemia, Meal Planning using the Plate Method, Diabetes Zone Management Tool . Advised patient, providing education and rationale, to check cbg daily before meals and record, calling the CCM team for findings  outside established parameters.  <80 and or >200  Patient Self Care Activities:  . Self administers medications as prescribed . Attends all scheduled provider appointments . Calls pharmacy for medication refills . Attends church or other social activities . Performs ADL's independently . Performs IADL's independently . Calls provider office for new concerns or questions   Please see past updates related to this goal by clicking on the "Past Updates" button in the selected goal      . "I would like to work on lowering my Triglycerides" (pt-stated)     Current Barriers:  Marland Kitchen Knowledge Deficits related to Self health management of Hypertriglyceridemia  Nurse Case Manager Clinical Goal(s):  Marland Kitchen Over the next 60 days, patient will work with the CCM team and PCP to address needs related to disease management of Hyperlipidemia/ Hypertriglyceridemia . Over the next 90 days, patient will lower her Triglycerides to target range; <149  CCM RN CM Interventions:  02/12/19 call completed with patient   . Evaluation of current treatment plan related to treatment management of Hypertriglyceridemia and patient's adherence to plan as established by provider. . Provided education to patient re: disease process and treatment management including pharmacological and Self Health management through diet, exercise, and weight management; educated patient on potential complications if untreated    . Reviewed medications with patient and discussed indication, dosage and frequency of Rosuvastatin for the noted condition  . Discussed plans with patient for ongoing care management follow up and provided patient with direct contact information for care management team . Provided patient with printed educational materials related to Triglycerides; Why Do They Matter  Patient Self Care Activities:  . Self administers medications as prescribed . Attends all scheduled provider appointments . Calls pharmacy for  medication refills . Performs ADL's independently . Performs IADL's independently . Calls provider office for new concerns or questions  Initial goal documentation     . Exercise 150 min/wk Moderate Activity    . I would like to continue to optimize my medication managment of my  chronic conditions (pt-stated)     Current Barriers:  . Diabetes: T2DM; most recent A1c 6.8% on 11/08/18  . Current antihyperglycemic regimen: Metformin, Toujeo 32 units qHS (sample given) o Ozempic '1mg'$  weekly (NOT taking Ozempic due to side effects) . denies hypoglycemic symptoms;denies hyperglycemic symptoms . Current exercise: walking . Current blood glucose readings: FBG<130 . Cardiovascular risk reduction: o Current hypertensive regimen: amlodipine, edarbi (gave samples), hctz - BP was 150/70 (BP was 146/70 at past visit)-counseled patient to practice low salt diet, avoid soft drinks (caffeine).  Patient has been unable to check.  Encouraged her to come to office to have checked. o Current hyperlipidemia regimen: rosuvastatin '20mg'$  daily (last filled 11/02/18 for 90DS)  Pharmacist Clinical Goal(s):  Marland Kitchen Over the next 90 days, patient with work with PharmD and primary care provider to address optimized medication management of chronic conditions  Interventions: . Comprehensive medication review performed, medication list updated in electronic medical record . Comprehensive medication review performed.  Reviewed medication fill history via insurance claims data confirming patient appears compliant with having her medications filled on time as prescribed by provider. . Reviewed & discussed the following diabetes-related information with patient: o Continue checking blood sugars as directed o Follow ADA recommended "diabetes-friendly" diet  (reviewed healthy snack/food options) o Reviewed medication purpose/side effects--> patient complains about GI symptoms from [place] and she has self-discontinued it  Patient  Self Care Activities:  . Patient will check blood glucose daily, document, and provide at future appointments . Patient will focus on medication adherence by continuing to take medications as prescribed. . Patient will take medications as prescribed . Patient will contact provider with any episodes of hypoglycemia . Patient will report any questions or concerns to provider   Please see past updates related to this goal by clicking on the "Past Updates" button in the selected goal      . Weight (lb) < 200 lb (90.7 kg)     She would like to lose 15 pounds within the next six months.       Depression Screen PHQ 2/9 Scores 02/08/2019 11/08/2018 06/13/2018 03/07/2018  PHQ - 2 Score 0 0 0 0     Fall Risk Fall Risk  02/08/2019  Falls in the past year? 0  Number falls in past yr: -  Injury with Fall? 0    Cognitive Function:     6CIT Screen 02/08/2019 11/08/2018  What Year? 0 points 0 points  What month? 0 points 0 points  What time? 0 points 0 points  Count back from 20 0 points 0 points  Months in reverse 0 points 0 points  Repeat phrase 0 points 2 points  Total Score 0 2    Patient Care Team: [name], MD as PCP - General (Internal Medicine) Rex Kras Claudette Stapler, RN as Case Manager Lavera Guise, [facility] (Pharmacist)     Plan:   1. Routine general medical examination at health care facility  The annual wellness visit was performed including discussion of advanced directives, assessment of functional status and cognitive function. A full exam was performed. EKG performed as well.    2. Type 2 diabetes mellitus with stage 2 chronic kidney disease, with long-term current use of insulin ([place])  Diabetic foot exam performed earlier this year.  I DISCUSSED WITH THE PATIENT AT LENGTH REGARDING THE GOALS OF GLYCEMIC CONTROL AND POSSIBLE LONG-TERM COMPLICATIONS.  I  ALSO STRESSED THE IMPORTANCE OF COMPLIANCE WITH HOME GLUCOSE MONITORING, DIETARY RESTRICTIONS INCLUDING AVOIDANCE OF  SUGARY DRINKS/PROCESSED FOODS,  ALONG WITH REGULAR EXERCISE.  I  ALSO STRESSED THE IMPORTANCE OF ANNUAL EYE EXAMS, SELF FOOT CARE AND COMPLIANCE WITH OFFICE VISITS.  - Lipid panel - Hemoglobin A1c  3. Hypertensive nephropathy  Chronic, fair control. Again, importance of regular exercise was discussed with the patient. She is encouraged to avoid adding salt to her foods. EKG performed, no new changes noted.   - EKG 12-Lead    I have personally reviewed and noted the following in the patient's chart:   . Medical and social history . Use of alcohol, tobacco or illicit drugs  . Current medications and supplements . Functional ability and status . Nutritional status . Physical activity . Advanced directives . List of other physicians . Hospitalizations, surgeries, and ER visits in previous 12 months . Vitals . Screenings to include cognitive, depression, and falls . Referrals and appointments  In addition, I have reviewed and discussed with patient certain preventive protocols, quality metrics, and best practice recommendations. A written personalized care plan for preventive services as well as general preventive health recommendations were provided to patient.     [name], MD 02/18/2019

## 2019-02-19 ENCOUNTER — Other Ambulatory Visit: Payer: Self-pay

## 2019-02-19 ENCOUNTER — Ambulatory Visit: Payer: Self-pay | Admitting: Pharmacist

## 2019-02-19 DIAGNOSIS — E1122 Type 2 diabetes mellitus with diabetic chronic kidney disease: Secondary | ICD-10-CM

## 2019-02-19 DIAGNOSIS — N182 Chronic kidney disease, stage 2 (mild): Secondary | ICD-10-CM

## 2019-02-19 DIAGNOSIS — Z794 Long term (current) use of insulin: Secondary | ICD-10-CM | POA: Diagnosis not present

## 2019-02-19 MED ORDER — ROSUVASTATIN CALCIUM 20 MG PO TABS: 20.0000 mg | ORAL_TABLET | Freq: Every day | ORAL | 1 refills | Status: DC

## 2019-02-19 NOTE — Progress Notes (Signed)
Chronic Care Management   Initial Visit Note  02/19/2019 Name: Leslie Davis MRN: [id number] DOB: 11/14/1955  Referred by: Sanders, [name], MD Reason for referral : Chronic Care Management   Leslie Davis is a 63 y.o. year old female who is a primary care patient of Sanders, [name], MD. The CCM team was consulted for assistance with chronic disease management and care coordination needs related to HLD and DMII  Review of patient status, including review of consultants reports, relevant laboratory and other test results, and collaboration with appropriate care team members and the patient's provider was performed as part of comprehensive patient evaluation and provision of chronic care management services.    I spoke with Ms. Leu by telephone today.  Medications: Outpatient Encounter Medications as of 02/19/2019  Medication Sig  . amLODipine (NORVASC) 10 MG tablet TAKE 1 TABLET ONCE DAILY.  . aspirin EC 81 MG tablet Take 81 mg by mouth daily.  . Blood Glucose Monitoring Suppl (ONETOUCH VERIO) w/Device KIT Use as directed to check blood sugars 2 times per day dx: e11.65  . cyclobenzaprine (FLEXERIL) 10 MG tablet Take 1 tablet (10 mg total) by mouth 2 (two) times daily as needed for muscle spasms. (Patient not taking: Reported on 02/08/2019)  . EDARBI 80 MG TABS TAKE 1 TABLET EACH DAY.  . ferrous gluconate (FERGON) 324 MG tablet TAKE 1 TABLET BY MOUTH TWICE DAILY WITH A MEAL.  . glucose blood (ONETOUCH VERIO) test strip Use as instructed to check blood sugars 2 times per day dx:e11.65  . hydrochlorothiazide (HYDRODIURIL) 12.5 MG tablet TAKE 1 TABLET ONCE DAILY.  . ibuprofen (ADVIL) 800 MG tablet Take 1 tablet (800 mg total) by mouth 3 (three) times daily. (Patient not taking: Reported on 02/08/2019)  . Insulin Glargine, 1 Unit Dial, (TOUJEO SOLOSTAR) 300 UNIT/ML SOPN Inject 32 Units into the skin at bedtime.  . Insulin Pen Needle 32G X 6 MM MISC Use with pen injectors daily dx  code: e11.9  . metFORMIN (GLUCOPHAGE) 850 MG tablet Take 1 tablet (850 mg total) by mouth 2 (two) times daily with a meal.  . OneTouch Delica Lancets 33G MISC Use as directed to check blood sugars 2 times per day dx:e11.65  . pantoprazole (PROTONIX) 40 MG tablet TAKE 1 TABLET ONCE DAILY.  . rosuvastatin (CRESTOR) 20 MG tablet Take 1 tablet (20 mg total) by mouth daily.  . temazepam (RESTORIL) 30 MG capsule TAKE 1 CAPSULE AT BEDTIME AS NEEDED.  . [DISCONTINUED] OZEMPIC, 1 MG/DOSE, 2 MG/1.5ML SOPN INJECT 1MG ONCE A WEEK IN THE ABDOMEN, THIGHS OR UPPER ARM ROTATING INJECTION SITES. (Patient not taking: No sig reported)   No facility-administered encounter medications on file as of 02/19/2019.      Objective:   Goals Addressed            This Visit's Progress     Patient Stated   . I would like to continue to optimize my medication managment of my chronic conditions (pt-stated)       Current Barriers:  . Diabetes: T2DM; most recent A1c 6.8% on 11/08/18 increased to 8.2% on 01/29/19 . Current antihyperglycemic regimen: Metformin, Toujeo 32 units qHS  o Ozempic 1mg weekly (NOT taking Ozempic due to side effects) . denies hypoglycemic symptoms;denies hyperglycemic symptoms . Current exercise: walking . Current blood glucose readings: FBG was 132; pt states she is trying to eat better, etc . Cardiovascular risk reduction: o Current hypertensive regimen: amlodipine, edarbi (gave samples), hctz - BP was 150/70 (  BP was 146/70 at past visit)-counseled patient to practice low salt diet, avoid soft drinks (caffeine).  Patient has been unable to check.  Encouraged her to come to office to have checked. o Current hyperlipidemia regimen: rosuvastatin 20mg daily (last filled 11/02/18 for 90DS) refill called in  Pharmacist Clinical Goal(s):  . Over the next 90 days, patient with work with PharmD and primary care provider to address optimized medication management of chronic conditions  Interventions:  . Comprehensive medication review performed, medication list updated in electronic medical record Reviewed & discussed the following diabetes-related information with patient: o Continue checking blood sugars as directed o Follow ADA recommended "diabetes-friendly" diet  (reviewed healthy snack/food options) o Reviewed medication purpose/side effects--> patient complains about GI symptoms from Ozempic and she has self-discontinued it  Patient Self Care Activities:  . Patient will check blood glucose daily, document, and provide at future appointments . Patient will focus on medication adherence by continuing to take medications as prescribed. . Patient will take medications as prescribed . Patient will contact provider with any episodes of hypoglycemia . Patient will report any questions or concerns to provider   Please see past updates related to this goal by clicking on the "Past Updates" button in the selected goal          Plan:   The care management team will reach out to the patient again over the next 6-8 weeks.  Provider Signature Julie Dattero Pruitt, PharmD, BCPS Clinical Pharmacist, [facility] [place]  [facility]  Direct Dial: [phone]    

## 2019-02-19 NOTE — Patient Instructions (Signed)
Visit Information  Goals Addressed            This Visit's Progress     Patient Stated   . I would like to continue to optimize my medication managment of my chronic conditions (pt-stated)       Current Barriers:  . Diabetes: T2DM; most recent A1c 6.8% on 11/08/18 increased to 8.2% on 01/29/19 . Current antihyperglycemic regimen: Metformin, Toujeo 32 units qHS  o Ozempic 1mg  weekly (NOT taking Ozempic due to side effects) . denies hypoglycemic symptoms;denies hyperglycemic symptoms . Current exercise: walking . Current blood glucose readings: FBG was 132; pt states she is trying to eat better, etc . Cardiovascular risk reduction: o Current hypertensive regimen: amlodipine, edarbi (gave samples), hctz - BP was 150/70 (BP was 146/70 at past visit)-counseled patient to practice low salt diet, avoid soft drinks (caffeine).  Patient has been unable to check.  Encouraged her to come to office to have checked. o Current hyperlipidemia regimen: rosuvastatin 20mg  daily (last filled 11/02/18 for 90DS) refill called in  Pharmacist Clinical Goal(s):  Marland Kitchen Over the next 90 days, patient with work with PharmD and primary care provider to address optimized medication management of chronic conditions  Interventions: . Comprehensive medication review performed, medication list updated in electronic medical record . Comprehensive medication review performed.  Reviewed medication fill history via insurance claims data confirming patient appears compliant with having her medications filled on time as prescribed by provider. . Reviewed & discussed the following diabetes-related information with patient: o Continue checking blood sugars as directed o Follow ADA recommended "diabetes-friendly" diet  (reviewed healthy snack/food options) o Reviewed medication purpose/side effects--> patient complains about GI symptoms from [place] and she has self-discontinued it  Patient Self Care Activities:  . Patient will  check blood glucose daily, document, and provide at future appointments . Patient will focus on medication adherence by continuing to take medications as prescribed. . Patient will take medications as prescribed . Patient will contact provider with any episodes of hypoglycemia . Patient will report any questions or concerns to provider   Please see past updates related to this goal by clicking on the "Past Updates" button in the selected goal         The patient verbalized understanding of instructions provided today and declined a print copy of patient instruction materials.   The care management team will reach out to the patient again over the next 45 days.   SIGNATURE Regina Eck, PharmD, BCPS Clinical Pharmacist, [facility] [place]: [phone]

## 2019-02-22 ENCOUNTER — Other Ambulatory Visit: Payer: Self-pay | Admitting: Gastroenterology

## 2019-02-28 ENCOUNTER — Other Ambulatory Visit: Payer: Self-pay | Admitting: Internal Medicine

## 2019-03-04 ENCOUNTER — Other Ambulatory Visit: Payer: Self-pay | Admitting: Internal Medicine

## 2019-03-06 ENCOUNTER — Other Ambulatory Visit: Payer: Self-pay | Admitting: Gastroenterology

## 2019-03-26 DIAGNOSIS — J309 Allergic rhinitis, unspecified: Secondary | ICD-10-CM | POA: Diagnosis not present

## 2019-03-26 DIAGNOSIS — J301 Allergic rhinitis due to pollen: Secondary | ICD-10-CM | POA: Diagnosis not present

## 2019-03-26 DIAGNOSIS — J3081 Allergic rhinitis due to animal (cat) (dog) hair and dander: Secondary | ICD-10-CM | POA: Diagnosis not present

## 2019-03-26 DIAGNOSIS — J3089 Other allergic rhinitis: Secondary | ICD-10-CM | POA: Diagnosis not present

## 2019-03-30 ENCOUNTER — Telehealth: Payer: Self-pay

## 2019-03-30 ENCOUNTER — Ambulatory Visit (INDEPENDENT_AMBULATORY_CARE_PROVIDER_SITE_OTHER): Payer: Medicare Other

## 2019-03-30 DIAGNOSIS — Z794 Long term (current) use of insulin: Secondary | ICD-10-CM | POA: Diagnosis not present

## 2019-03-30 DIAGNOSIS — E1122 Type 2 diabetes mellitus with diabetic chronic kidney disease: Secondary | ICD-10-CM

## 2019-03-30 DIAGNOSIS — I129 Hypertensive chronic kidney disease with stage 1 through stage 4 chronic kidney disease, or unspecified chronic kidney disease: Secondary | ICD-10-CM | POA: Diagnosis not present

## 2019-03-30 DIAGNOSIS — N182 Chronic kidney disease, stage 2 (mild): Secondary | ICD-10-CM

## 2019-04-02 NOTE — Chronic Care Management (AMB) (Signed)
Chronic Care Management   Follow Up Note   04/02/2019 Name: Leslie Davis MRN: [id number] DOB: 04-21-1955  Referred by: [name], MD Reason for referral : Chronic Care Management (CCM RN CM Telephone Follow up )   Leslie Davis is a 64 y.o. year old female who is a primary care patient of [name], MD. The CCM team was consulted for assistance with chronic disease management and care coordination needs.    Review of patient status, including review of consultants reports, relevant laboratory and other test results, and collaboration with appropriate care team members and the patient's provider was performed as part of comprehensive patient evaluation and provision of chronic care management services.    SDOH (Social Determinants of Health) screening performed today: None. See Care Plan for related entries.   Placed outbound call to patient for a CCM RN CM update.   Outpatient Encounter Medications as of 03/30/2019  Medication Sig  . amLODipine (NORVASC) 10 MG tablet TAKE 1 TABLET ONCE DAILY.  Marland Kitchen aspirin EC 81 MG tablet Take 81 mg by mouth daily.  . Blood Glucose Monitoring Suppl (ONETOUCH VERIO) w/Device KIT Use as directed to check blood sugars 2 times per day dx: e11.65  . cyclobenzaprine (FLEXERIL) 10 MG tablet Take 1 tablet (10 mg total) by mouth 2 (two) times daily as needed for muscle spasms. (Patient not taking: Reported on 02/08/2019)  . EDARBI 80 MG TABS TAKE 1 TABLET EACH DAY.  . ferrous gluconate (FERGON) 324 MG tablet TAKE 1 TABLET BY MOUTH TWICE DAILY WITH A MEAL.  Marland Kitchen glucose blood (ONETOUCH VERIO) test strip Use as instructed to check blood sugars 2 times per day dx:e11.65  . hydrochlorothiazide (HYDRODIURIL) 12.5 MG tablet TAKE 1 TABLET ONCE DAILY.  Marland Kitchen ibuprofen (ADVIL) 800 MG tablet Take 1 tablet (800 mg total) by mouth 3 (three) times daily. (Patient not taking: Reported on 02/08/2019)  . Insulin Glargine, 1 Unit Dial, (TOUJEO SOLOSTAR) 300 UNIT/ML SOPN  Inject 32 Units into the skin at bedtime.  . Insulin Pen Needle 32G X 6 MM MISC Use with pen injectors daily dx code: e11.9  . metFORMIN (GLUCOPHAGE) 850 MG tablet TAKE 1 TABLET BY MOUTH TWICE DAILY WITH A MEAL.  Glory Rosebush Delica Lancets 62M MISC Use as directed to check blood sugars 2 times per day dx:e11.65  . pantoprazole (PROTONIX) 40 MG tablet TAKE 1 TABLET ONCE DAILY.  . rosuvastatin (CRESTOR) 20 MG tablet Take 1 tablet (20 mg total) by mouth daily.  . temazepam (RESTORIL) 30 MG capsule TAKE 1 CAPSULE AT BEDTIME AS NEEDED.   No facility-administered encounter medications on file as of 03/30/2019.     Goals Addressed      Patient Stated   . COMPLETED: "I went to the ED for a UTI" (pt-stated)       Current Barriers:  Marland Kitchen Knowledge Deficits related to treatment management of Impaired Urinary Elimination  . ED visit for UTI completed on 01/25/19  Nurse Case Manager Clinical Goal(s):  Marland Kitchen Over the next 14 days, patient will have completed her full course of antibiotics as prescribed for UTI Goal Met  . Over the next 30 days, patient will demonstrate a decrease in UTI exacerbations as evidenced by patient will experience no s/s suggestive of UTI  Goal Met . Over the next 60 days, patient will be able to verbalize increased knowledge of s/s suggestive of UTI and know when to seek medical attention Goal Met  CCM RN CM Interventions:  04/02/19 call completed with patient   . Evaluation of current treatment plan related to UTI and patient's adherence to plan as established by provider. . Discussed plans with patient for ongoing care management follow up and provided patient with direct contact information for care management team . Determined patient has had no further symptoms suggestive of UTI, she reports this issue has resolved   Patient Self Care Activities:  . Self administers medications as prescribed . Attends all scheduled provider appointments . Calls pharmacy for medication  refills . Performs ADL's independently . Performs IADL's independently . Calls provider office for new concerns or questions  Please see past updates related to this goal by clicking on the "Past Updates" button in the selected goal     . "I would like to better manage my Diabetes" (pt-stated)       Current Barriers:  Marland Kitchen Knowledge Deficits related to diabetes disease process and Self Health Management   Nurse Case Manager Clinical Goal(s):  Marland Kitchen Over the next 90 days, patient will work with the CCM team to address needs related to education and support to improve Self management of DM . New - 02/12/19 - Over the next 90 days, patient will lower her A1C <8.2  CCM RN CM Interventions:  04/02/19 call completed with patient   . Evaluation of current treatment plan related to Diabetes and patient's adherence to plan as established by provider. . Provided education to patient re: most recent A1C has increased from 6.8 to 8.2 obtained on 02/08/19; discussed target ranges; educated patient on [place] management interventions to help lower A1C; including adherence to following a Diabetic friendly diet, implementing daily exercise in her routine, discussed per ADA recommendations 150 min per week; adherence to taking her prescribed medications exactly as prescribed and managing her weight to stay within recommended BMI for best overall health; determined patient is going to the gym 3 x's weekly, she verbalizes making dietary changes that are diabetic friendly    . Reviewed medications with patient and discussed indication, dosage and frequency of Toujeo and Metformin for treatment of DM; determined patient discontinued Ozempic due to having GI SE . Discussed plans with patient for ongoing care management follow up and provided patient with direct contact information for care management team . Provided patient with printed educational materials related to Health Carb Snacks . Advised patient, providing  education and rationale, to check cbg daily before meals and record, calling the CCM team for findings outside established parameters.  (80-130)  Patient Self Care Activities:  . Self administers medications as prescribed . Attends all scheduled provider appointments . Calls pharmacy for medication refills . Attends church or other social activities . Performs ADL's independently . Performs IADL's independently . Calls provider office for new concerns or questions  Please see past updates related to this goal by clicking on the "Past Updates" button in the selected goal    . "I would like to work on lowering my Triglycerides" (pt-stated)       Current Barriers:  Marland Kitchen Knowledge Deficits related to Self health management of Hypertriglyceridemia  Nurse Case Manager Clinical Goal(s):  Marland Kitchen Over the next 60 days, patient will work with the CCM team and PCP to address needs related to disease management of Hyperlipidemia/ Hypertriglyceridemia . Over the next 90 days, patient will lower her Triglycerides to target range; <149  CCM RN CM Interventions:  04/02/19 call completed with patient   . Evaluation of current treatment plan related to  treatment management of Hypertriglyceridemia and patient's adherence to plan as established by provider. . Provided education to patient re: disease process and treatment management including pharmacological and Self Health management through diet, exercise, and weight management; educated patient on potential complications if untreated    . Reviewed medications with patient and discussed indication, dosage and frequency of Rosuvastatin for the noted condition  . Discussed plans with patient for ongoing care management follow up and provided patient with direct contact information for care management team . Determined patient received the patient educational material related to how to lower her Triglycerides, she found the information helpful and denies having  questions at this time . Determined patient states, "I am trying to eat better" and "I am exercising three times a week at the gym"; Praised patient for making good healthy choices to help better manage her chronic conditions  Patient Self Care Activities:  . Self administers medications as prescribed . Attends all scheduled provider appointments . Calls pharmacy for medication refills . Performs ADL's independently . Performs IADL's independently . Calls provider office for new concerns or questions  Please see past updates related to this goal by clicking on the "Past Updates" button in the selected goal         Telephone follow up appointment with care management team member scheduled for: 04/30/19  Barb Merino, RN, BSN, CCM Care Management Coordinator [place] [facility]  Direct Phone: [phone]

## 2019-04-02 NOTE — Patient Instructions (Signed)
Visit Information  Goals Addressed      Patient Stated   . COMPLETED: "I went to the ED for a UTI" (pt-stated)       Current Barriers:  Marland Kitchen Knowledge Deficits related to treatment management of Impaired Urinary Elimination  . ED visit for UTI completed on 01/25/19  Nurse Case Manager Clinical Goal(s):  Marland Kitchen Over the next 14 days, patient will have completed her full course of antibiotics as prescribed for UTI Goal Met  . Over the next 30 days, patient will demonstrate a decrease in UTI exacerbations as evidenced by patient will experience no s/s suggestive of UTI  Goal Met . Over the next 60 days, patient will be able to verbalize increased knowledge of s/s suggestive of UTI and know when to seek medical attention Goal Met  CCM RN CM Interventions:  04/02/19 call completed with patient   . Evaluation of current treatment plan related to UTI and patient's adherence to plan as established by provider. . Discussed plans with patient for ongoing care management follow up and provided patient with direct contact information for care management team . Determined patient has had no further symptoms suggestive of UTI, she reports this issue has resolved   Patient Self Care Activities:  . Self administers medications as prescribed . Attends all scheduled provider appointments . Calls pharmacy for medication refills . Performs ADL's independently . Performs IADL's independently . Calls provider office for new concerns or questions  Please see past updates related to this goal by clicking on the "Past Updates" button in the selected goal        . "I would like to better manage my Diabetes" (pt-stated)       Current Barriers:  Marland Kitchen Knowledge Deficits related to diabetes disease process and Self Health Management   Nurse Case Manager Clinical Goal(s):  Marland Kitchen Over the next 90 days, patient will work with the CCM team to address needs related to education and support to improve Self management of  DM . New - 02/12/19 - Over the next 90 days, patient will lower her A1C <8.2  CCM RN CM Interventions:  04/02/19 call completed with patient   . Evaluation of current treatment plan related to Diabetes and patient's adherence to plan as established by provider. . Provided education to patient re: most recent A1C has increased from 6.8 to 8.2 obtained on 02/08/19; discussed target ranges; educated patient on [place] management interventions to help lower A1C; including adherence to following a Diabetic friendly diet, implementing daily exercise in her routine, discussed per ADA recommendations 150 min per week; adherence to taking her prescribed medications exactly as prescribed and managing her weight to stay within recommended BMI for best overall health; determined patient is going to the gym 3 x's weekly, she verbalizes making dietary changes that are diabetic friendly    . Reviewed medications with patient and discussed indication, dosage and frequency of Toujeo and Metformin for treatment of DM; determined patient discontinued Ozempic due to having GI SE . Discussed plans with patient for ongoing care management follow up and provided patient with direct contact information for care management team . Provided patient with printed educational materials related to Health Carb Snacks . Advised patient, providing education and rationale, to check cbg daily before meals and record, calling the CCM team for findings outside established parameters.  (80-130)  Patient Self Care Activities:  . Self administers medications as prescribed . Attends all scheduled provider appointments . Calls pharmacy for  medication refills . Attends church or other social activities . Performs ADL's independently . Performs IADL's independently . Calls provider office for new concerns or questions  Please see past updates related to this goal by clicking on the "Past Updates" button in the selected goal      .  "I would like to work on lowering my Triglycerides" (pt-stated)       Current Barriers:  Marland Kitchen Knowledge Deficits related to Self health management of Hypertriglyceridemia  Nurse Case Manager Clinical Goal(s):  Marland Kitchen Over the next 60 days, patient will work with the CCM team and PCP to address needs related to disease management of Hyperlipidemia/ Hypertriglyceridemia . Over the next 90 days, patient will lower her Triglycerides to target range; <149  CCM RN CM Interventions:  04/02/19 call completed with patient   . Evaluation of current treatment plan related to treatment management of Hypertriglyceridemia and patient's adherence to plan as established by provider. . Provided education to patient re: disease process and treatment management including pharmacological and Self Health management through diet, exercise, and weight management; educated patient on potential complications if untreated    . Reviewed medications with patient and discussed indication, dosage and frequency of Rosuvastatin for the noted condition  . Discussed plans with patient for ongoing care management follow up and provided patient with direct contact information for care management team . Determined patient received the patient educational material related to how to lower her Triglycerides, she found the information helpful and denies having questions at this time . Determined patient states, "I am trying to eat better" and "I am exercising three times a week at the gym"; Praised patient for making good healthy choices to help better manage her chronic conditions  Patient Self Care Activities:  . Self administers medications as prescribed . Attends all scheduled provider appointments . Calls pharmacy for medication refills . Performs ADL's independently . Performs IADL's independently . Calls provider office for new concerns or questions  Please see past updates related to this goal by clicking on the "Past Updates"  button in the selected goal        The patient verbalized understanding of instructions provided today and declined a print copy of patient instruction materials.   Telephone follow up appointment with care management team member scheduled for: 04/30/19  Barb Merino, RN, BSN, CCM Care Management Coordinator [place] [facility]  Direct Phone: [phone]

## 2019-04-09 ENCOUNTER — Other Ambulatory Visit: Payer: Self-pay | Admitting: Gastroenterology

## 2019-04-16 ENCOUNTER — Other Ambulatory Visit: Payer: Self-pay | Admitting: Cardiology

## 2019-04-16 ENCOUNTER — Telehealth: Payer: Self-pay | Admitting: Pharmacist

## 2019-04-16 DIAGNOSIS — Z20822 Contact with and (suspected) exposure to covid-19: Secondary | ICD-10-CM | POA: Diagnosis not present

## 2019-04-17 LAB — NOVEL CORONAVIRUS, NAA: SARS-CoV-2, NAA: NOT DETECTED

## 2019-04-30 ENCOUNTER — Telehealth: Payer: Self-pay

## 2019-04-30 ENCOUNTER — Ambulatory Visit: Payer: Self-pay

## 2019-04-30 DIAGNOSIS — E1122 Type 2 diabetes mellitus with diabetic chronic kidney disease: Secondary | ICD-10-CM

## 2019-04-30 DIAGNOSIS — I129 Hypertensive chronic kidney disease with stage 1 through stage 4 chronic kidney disease, or unspecified chronic kidney disease: Secondary | ICD-10-CM

## 2019-04-30 DIAGNOSIS — N182 Chronic kidney disease, stage 2 (mild): Secondary | ICD-10-CM

## 2019-05-01 ENCOUNTER — Telehealth: Payer: Self-pay

## 2019-05-01 ENCOUNTER — Other Ambulatory Visit: Payer: Self-pay

## 2019-05-01 NOTE — Chronic Care Management (AMB) (Signed)
  Chronic Care Management   Outreach Note  05/01/2019 Name: Leslie Davis MRN: [id number] DOB: 1955-08-15  Referred by: [name], MD Reason for referral : Chronic Care Management (CCM RNCM Telephone Follow up )   An unsuccessful telephone outreach was attempted today. The patient was referred to the case management team for assistance with care management and care coordination.   Follow Up Plan: Telephone follow up appointment with care management team member scheduled for: 06/01/19  Barb Merino, RN, BSN, CCM Care Management Coordinator [place] [facility]  Direct Phone: [phone]

## 2019-05-03 ENCOUNTER — Other Ambulatory Visit: Payer: Self-pay | Admitting: Internal Medicine

## 2019-05-03 DIAGNOSIS — G4700 Insomnia, unspecified: Secondary | ICD-10-CM

## 2019-05-03 NOTE — Telephone Encounter (Signed)
Temazepam 30MG  refill request

## 2019-05-07 ENCOUNTER — Emergency Department (HOSPITAL_COMMUNITY): Payer: Medicare Other

## 2019-05-07 ENCOUNTER — Encounter (HOSPITAL_COMMUNITY): Payer: Self-pay

## 2019-05-07 ENCOUNTER — Other Ambulatory Visit: Payer: Self-pay

## 2019-05-07 ENCOUNTER — Emergency Department (HOSPITAL_COMMUNITY)
Admission: EM | Admit: 2019-05-07 | Discharge: 2019-05-07 | Disposition: A | Discharge status: Home / Self Care | Payer: Medicare Other | Attending: Emergency Medicine | Admitting: Emergency Medicine

## 2019-05-07 DIAGNOSIS — X500XXA Overexertion from strenuous movement or load, initial encounter: Secondary | ICD-10-CM | POA: Insufficient documentation

## 2019-05-07 DIAGNOSIS — E119 Type 2 diabetes mellitus without complications: Secondary | ICD-10-CM | POA: Diagnosis not present

## 2019-05-07 DIAGNOSIS — Y93B2 Activity, push-ups, pull-ups, sit-ups: Secondary | ICD-10-CM | POA: Insufficient documentation

## 2019-05-07 DIAGNOSIS — Z794 Long term (current) use of insulin: Secondary | ICD-10-CM | POA: Diagnosis not present

## 2019-05-07 DIAGNOSIS — Y999 Unspecified external cause status: Secondary | ICD-10-CM | POA: Diagnosis not present

## 2019-05-07 DIAGNOSIS — I10 Essential (primary) hypertension: Secondary | ICD-10-CM | POA: Insufficient documentation

## 2019-05-07 DIAGNOSIS — Y929 Unspecified place or not applicable: Secondary | ICD-10-CM | POA: Diagnosis not present

## 2019-05-07 DIAGNOSIS — S4991XA Unspecified injury of right shoulder and upper arm, initial encounter: Secondary | ICD-10-CM | POA: Diagnosis not present

## 2019-05-07 DIAGNOSIS — Z87891 Personal history of nicotine dependence: Secondary | ICD-10-CM | POA: Insufficient documentation

## 2019-05-07 DIAGNOSIS — Z79899 Other long term (current) drug therapy: Secondary | ICD-10-CM | POA: Insufficient documentation

## 2019-05-07 DIAGNOSIS — Z7982 Long term (current) use of aspirin: Secondary | ICD-10-CM | POA: Diagnosis not present

## 2019-05-07 MED ORDER — KETOROLAC TROMETHAMINE 60 MG/2ML IM SOLN
30.0000 mg | Freq: Once | INTRAMUSCULAR | Status: AC
  Administered 2019-05-07: 30 mg via INTRAMUSCULAR
  Filled 2019-05-07: qty 2

## 2019-05-07 MED ORDER — HYDROCODONE-ACETAMINOPHEN 5-325 MG PO TABS: 1.0000 | ORAL_TABLET | Freq: Four times a day (QID) | ORAL | 0 refills | Status: DC | PRN

## 2019-05-07 MED ORDER — LIDOCAINE 5 % EX PTCH: 1.0000 | MEDICATED_PATCH | CUTANEOUS | 0 refills | Status: DC

## 2019-05-07 NOTE — Discharge Instructions (Addendum)
You have been seen today for a shoulder injury. There were no acute abnormalities on the x-rays, including no sign of fracture or dislocation, however, there could be injuries to the soft tissues, such as the ligaments or tendons that are not seen on xrays. There could also be what are called occult fractures that are small fractures not seen on xray. Antiinflammatory medications: Take 600 mg of ibuprofen every 6 hours or 440 mg (over the counter dose) to 500 mg (prescription dose) of naproxen every 12 hours for the next 3 days. After this time, these medications may be used as needed for pain. Take these medications with food to avoid upset stomach. Choose only one of these medications, do not take them together. Acetaminophen (generic for Tylenol): Should you continue to have additional pain while taking the ibuprofen or naproxen, you may add in acetaminophen as needed. Your daily total maximum amount of acetaminophen from all sources should be limited to 4000mg /day for persons without liver problems, or 2000mg /day for those with liver problems. Vicodin: May take Vicodin (hydrocodone-acetaminophen) as needed for severe pain.   Do not drive or perform other dangerous activities while taking this medication as it can cause drowsiness as well as changes in reaction time and judgement.   Please note that each pill of Vicodin contains 325 mg of acetaminophen (generic for Tylenol) and the above dosage limits apply. Ice: May apply ice to the area over the next 24 hours for 15 minutes at a time to reduce swelling. Elevation: Keep the extremity elevated as often as possible to reduce pain and inflammation. Support: Wear the sling for support and comfort. Wear this until pain resolves.  Exercises: Start by performing these exercises a few times a week, increasing the frequency until you are performing them twice daily.  Follow up: Follow-up with the orthopedic specialist within the next 2 weeks. Return: Return to  the ED for numbness, weakness, increasing pain, overall worsening symptoms, loss of function, or if symptoms are not improving, you have tried to follow up with the orthopedic specialist, and have been unable to do so.  For prescription assistance, may try using prescription discount sites or apps, such as goodrx.com

## 2019-05-07 NOTE — ED Triage Notes (Addendum)
Patient states she pulled her right shoulder utilizing a piece of equipment at the gym yesterday.

## 2019-05-07 NOTE — ED Notes (Signed)
An After Visit Summary was printed and given to the patient. Discharge instructions given and no further questions at this time.  

## 2019-05-07 NOTE — ED Provider Notes (Signed)
[facility] DEPT Provider Note   CSN: [id number] Arrival date & time: 05/07/19  1519     History Chief Complaint  Patient presents with  . Shoulder Injury    Leslie Davis is a 64 y.o. female.  HPI      Leslie Davis is a 64 y.o. female, with a history of DM, fatty liver disease, HTN, hyperlipidemia, presenting to the ED with injury to the right shoulder that occurred yesterday.  Patient was using a piece of exercise equipment that involved her performing weight assisted pull-ups.  She states she did not place enough counterweight and her body dropped faster than anticipated, stretching out her right arm above her head. She has throbbing, moderate to severe pain to the right anterior shoulder, worse with movement, nonradiating.  She notes she had a small amount of pain after the incident, but much worse pain this morning.  She took a dose of Tylenol yesterday without improvement.  Denies numbness, weakness, chest pain, shortness of breath, back pain, neck pain, swelling, color change, or any other complaints.    Past Medical History:  Diagnosis Date  . DM (diabetes mellitus) ([place])   . Fatty liver disease, nonalcoholic   . Gastritis   . Gastroparesis   . GERD (gastroesophageal reflux disease)   . Hiatal hernia   . Hiatal hernia   . HLD (hyperlipidemia)   . HTN (hypertension)   . Renal calculus     Patient Active Problem List   Diagnosis Date Noted  . Erosive gastritis 11/16/2017  . Fatty liver 11/16/2017  . Abdominal pain 12/11/2012  . Early satiety 12/11/2012  . Hiatal hernia   . Hx of cholecystectomy 02/05/2011  . Flu-like symptoms 02/05/2011  . CONSTIPATION 08/27/2008  . FATTY LIVER DISEASE 05/07/2008  . Nausea with vomiting 04/23/2008  . EPIGASTRIC PAIN 04/23/2008  . RENAL CALCULUS 04/22/2008  . DIABETES MELLITUS II, UNCOMPLICATED 19/37/9024  . HYPERLIPIDEMIA 05/19/2006  . HYPERTENSION, BENIGN SYSTEMIC 05/19/2006  .  GASTROESOPHAGEAL REFLUX, NO ESOPHAGITIS 05/19/2006  . AMENORRHEA 05/19/2006    Past Surgical History:  Procedure Laterality Date  . CHOLECYSTECTOMY    . COLONOSCOPY    . TUBAL LIGATION    . UPPER GASTROINTESTINAL ENDOSCOPY       OB History   No obstetric history on file.     Family History  Problem Relation Age of Onset  . Diabetes Mother   . Diabetes Maternal Grandmother   . Stomach cancer Maternal Aunt        X 2 aunts  . Hypertension Father   . Colon cancer Neg Hx   . Esophageal cancer Neg Hx   . Rectal cancer Neg Hx     Social History   Tobacco Use  . Smoking status: Former Smoker    Packs/day: 0.25    Years: 1.00    Pack years: 0.25    Quit date: 09/10/1974    Years since quitting: 44.6  . Smokeless tobacco: Never Used  . Tobacco comment: she no longer smokes.   Substance Use Topics  . Alcohol use: No  . Drug use: No    Home Medications Prior to Admission medications   Medication Sig Start Date End Date Taking? Authorizing Provider  amLODipine (NORVASC) 10 MG tablet TAKE 1 TABLET ONCE DAILY. 03/02/19   [name], MD  aspirin EC 81 MG tablet Take 81 mg by mouth daily.    [provider]  Blood Glucose Monitoring Suppl (ONETOUCH VERIO) w/Device  KIT Use as directed to check blood sugars 2 times per day dx: e11.65 02/13/19   [name], MD  cyclobenzaprine (FLEXERIL) 10 MG tablet Take 1 tablet (10 mg total) by mouth 2 (two) times daily as needed for muscle spasms. Patient not taking: Reported on 02/08/2019 01/25/19   Montine Circle, PA-C  EDARBI 80 MG TABS TAKE 1 TABLET EACH DAY. 03/05/19   [name], MD  ferrous gluconate (FERGON) 324 MG tablet TAKE 1 TABLET BY MOUTH TWICE DAILY WITH A MEAL. 02/22/19   Mansouraty, Telford Nab., MD  glucose blood (ONETOUCH VERIO) test strip Use as instructed to check blood sugars 2 times per day dx:e11.65 02/13/19   [name], MD  hydrochlorothiazide (HYDRODIURIL) 12.5 MG tablet TAKE 1 TABLET ONCE  DAILY. 11/07/18   [name], MD  ibuprofen (ADVIL) 800 MG tablet Take 1 tablet (800 mg total) by mouth 3 (three) times daily. Patient not taking: Reported on 02/08/2019 01/25/19   Montine Circle, PA-C  Insulin Glargine, 1 Unit Dial, (TOUJEO SOLOSTAR) 300 UNIT/ML SOPN Inject 32 Units into the skin at bedtime. 01/22/19   [name], MD  Insulin Pen Needle 32G X 6 MM MISC Use with pen injectors daily dx code: e11.9 12/26/18   [name], MD  metFORMIN (GLUCOPHAGE) 850 MG tablet TAKE 1 TABLET BY MOUTH TWICE DAILY WITH A MEAL. 03/02/19   [name], MD  OneTouch Delica Lancets 51V MISC Use as directed to check blood sugars 2 times per day dx:e11.65 02/13/19   [name], MD  pantoprazole (PROTONIX) 40 MG tablet TAKE 1 TABLET ONCE DAILY. 04/12/19   Mansouraty, Telford Nab., MD  rosuvastatin (CRESTOR) 20 MG tablet Take 1 tablet (20 mg total) by mouth daily. 02/19/19   [name], MD  temazepam (RESTORIL) 30 MG capsule TAKE 1 CAPSULE AT BEDTIME AS NEEDED. 05/03/19   [name], MD    Allergies    Patient has no known allergies.  Review of Systems   Review of Systems  Gastrointestinal: Negative for nausea and vomiting.  Musculoskeletal: Positive for arthralgias. Negative for back pain and neck pain.  Neurological: Negative for weakness and numbness.    Physical Exam Updated Vital Signs BP (!) 157/85 (BP Location: Left Arm)   Pulse 80   Temp 98.2 F (36.8 C) (Oral)   Resp 18   Ht 5' 3" (1.6 m)   Wt 100.2 kg   SpO2 97%   BMI 39.15 kg/m   Physical Exam Vitals and nursing note reviewed.  Constitutional:      General: She is not in acute distress.    Appearance: She is well-developed. She is not diaphoretic.  HENT:     Head: Normocephalic and atraumatic.  Eyes:     Conjunctiva/sclera: Conjunctivae normal.  Cardiovascular:     Rate and Rhythm: Normal rate and regular rhythm.     Pulses:          Radial pulses are 2+ on the right side and 2+ on the left side.    Pulmonary:     Effort: Pulmonary effort is normal.  Musculoskeletal:     Cervical back: Neck supple.     Comments: Patient has difficulty with movement of the right shoulder that she states is due to pain and stiffness in the shoulder.  No noted deformity, instability, swelling, color change. Full range of motion without pain or noted difficulty in the right elbow and wrist.  Normal motor function intact in all other extremities. No midline spinal tenderness.  Skin:    General: Skin is warm and dry.     Capillary Refill: Capillary refill takes less than 2 seconds.     Coloration: Skin is not pale.  Neurological:     Mental Status: She is alert.     Comments: Sensation grossly intact to light touch through each of the nerve distributions of the bilateral upper extremities. Abduction and adduction of the fingers intact against resistance. Grip strength equal bilaterally. Supination and pronation intact against resistance. Strength 5/5 through the cardinal directions of the bilateral wrists. Strength 5/5 with flexion and extension of the bilateral elbows. Patient can touch the thumb to each one of the fingertips without difficulty.  Patient can hold the "OK" sign against resistance.  Psychiatric:        Behavior: Behavior normal.     ED Results / Procedures / Treatments   Labs (all labs ordered are listed, but only abnormal results are displayed) Labs Reviewed - No data to display  EKG None  Radiology DG Shoulder Right  Result Date: 05/07/2019 CLINICAL DATA:  Limited range of motion after injury yesterday EXAM: RIGHT SHOULDER - 2+ VIEW COMPARISON:  04/02/2017 FINDINGS: Frontal and transscapular views of the right shoulder are obtained. There has been dissolution or resection of the calcified body within the rotator cuff seen previously. Rounded ossific density in the glenohumeral recess likely reflects osteochondromatosis and is stable. Joint spaces are well preserved. No  fractures. Alignment is anatomic. Right chest is clear. IMPRESSION: 1. Stable rounded ossific density consistent with osteochondromatosis. 2. Dissolution or removal of the calcific tendinopathy noted previously. 3. No acute bony abnormality. Electronically Signed   By: Randa Ngo M.D.   On: 05/07/2019 15:58    Procedures Procedures (including critical care time)  Medications Ordered in ED Medications  ketorolac (TORADOL) injection 30 mg (30 mg Intramuscular Given 05/07/19 1611)    ED Course  I have reviewed the triage vital signs and the nursing notes.  Pertinent labs & imaging results that were available during my care of the patient were reviewed by me and considered in my medical decision making (see chart for details).    MDM Rules/Calculators/A&P                      Patient presents with right shoulder injury that occurred yesterday. No noted evidence of neurovascular compromise. No acute abnormality on x-ray. Orthopedic follow-up.  Placed in a sling for comfort. The patient was given instructions for home care as well as return precautions. Patient voices understanding of these instructions, accepts the plan, and is comfortable with discharge.  Findings and plan of care discussed with Lennice Sites, DO.   Final Clinical Impression(s) / ED Diagnoses Final diagnoses:  Injury of right shoulder, initial encounter    Rx / DC Orders ED Discharge Orders    None       Layla Maw 05/07/19 [place], [place], DO 05/07/19 1718

## 2019-05-08 DIAGNOSIS — M1712 Unilateral primary osteoarthritis, left knee: Secondary | ICD-10-CM | POA: Diagnosis not present

## 2019-05-14 ENCOUNTER — Other Ambulatory Visit: Payer: Self-pay | Admitting: Internal Medicine

## 2019-05-14 ENCOUNTER — Other Ambulatory Visit: Payer: Self-pay | Admitting: Gastroenterology

## 2019-05-14 ENCOUNTER — Other Ambulatory Visit: Payer: Self-pay

## 2019-05-14 MED ORDER — TOUJEO SOLOSTAR 300 UNIT/ML ~~LOC~~ SOPN: 32.0000 [IU] | PEN_INJECTOR | Freq: Every day | SUBCUTANEOUS | 3 refills | Status: DC

## 2019-05-14 MED ORDER — ROSUVASTATIN CALCIUM 20 MG PO TABS: 20.0000 mg | ORAL_TABLET | Freq: Every day | ORAL | 1 refills | Status: DC

## 2019-05-14 NOTE — Patient Outreach (Signed)
[facility]) Care Management  05/14/2019  Leslie Davis 01/06/1956 [id number]   Medication Adherence call to Mrs. [place] Compliant Voice message left with a call back number. [name] is showing past due on Edarbi 80 mg under [place].  [facility] Management Direct Dial [phone]  Fax [phone] [email or web address]

## 2019-05-15 ENCOUNTER — Other Ambulatory Visit: Payer: Self-pay

## 2019-05-15 NOTE — Patient Outreach (Signed)
[facility]) Care Management  05/15/2019  Leslie Davis 1956/01/11 [id number]   Medication Adherence call to Leslie Davis [facility] Identifiers Verify spoke with patient she is past due on Edarbi 80 mg,patient explain she takes 1 tablet daily ,patiet has pick up this medication from the pharmacy yesterday and has enough for 30 days. Leslie Davis is showing past due under [place].   [facility] Management Direct Dial [phone]  Fax [phone] [email or web address]

## 2019-05-16 ENCOUNTER — Other Ambulatory Visit: Payer: Self-pay

## 2019-05-16 ENCOUNTER — Encounter: Payer: Self-pay | Admitting: Internal Medicine

## 2019-05-16 ENCOUNTER — Ambulatory Visit (INDEPENDENT_AMBULATORY_CARE_PROVIDER_SITE_OTHER): Payer: Medicare Other | Admitting: Internal Medicine

## 2019-05-16 VITALS — BP 144/76 | HR 63 | Temp 98.3°F | Ht 63.0 in | Wt 218.8 lb

## 2019-05-16 DIAGNOSIS — E1122 Type 2 diabetes mellitus with diabetic chronic kidney disease: Secondary | ICD-10-CM

## 2019-05-16 DIAGNOSIS — G4700 Insomnia, unspecified: Secondary | ICD-10-CM

## 2019-05-16 DIAGNOSIS — L659 Nonscarring hair loss, unspecified: Secondary | ICD-10-CM | POA: Diagnosis not present

## 2019-05-16 DIAGNOSIS — Z794 Long term (current) use of insulin: Secondary | ICD-10-CM | POA: Diagnosis not present

## 2019-05-16 DIAGNOSIS — I1310 Hypertensive heart and chronic kidney disease without heart failure, with stage 1 through stage 4 chronic kidney disease, or unspecified chronic kidney disease: Secondary | ICD-10-CM | POA: Insufficient documentation

## 2019-05-16 DIAGNOSIS — I129 Hypertensive chronic kidney disease with stage 1 through stage 4 chronic kidney disease, or unspecified chronic kidney disease: Secondary | ICD-10-CM | POA: Diagnosis not present

## 2019-05-16 DIAGNOSIS — Z23 Encounter for immunization: Secondary | ICD-10-CM

## 2019-05-16 DIAGNOSIS — N182 Chronic kidney disease, stage 2 (mild): Secondary | ICD-10-CM

## 2019-05-16 DIAGNOSIS — Z6838 Body mass index (BMI) 38.0-38.9, adult: Secondary | ICD-10-CM

## 2019-05-16 MED ORDER — HYDROCHLOROTHIAZIDE 12.5 MG PO TABS: 12.5000 mg | ORAL_TABLET | Freq: Every day | ORAL | 2 refills | Status: DC

## 2019-05-16 MED ORDER — TEMAZEPAM 30 MG PO CAPS: ORAL_CAPSULE | ORAL | 1 refills | Status: DC

## 2019-05-16 NOTE — Progress Notes (Signed)
This visit occurred during the SARS-CoV-2 public health emergency.  Safety protocols were in place, including screening questions prior to the visit, additional usage of staff PPE, and extensive cleaning of exam room while observing appropriate contact time as indicated for disinfecting solutions.  Subjective:     Patient ID: Leslie Davis , female    DOB: 12/15/1955 , 64 y.o.   MRN: [id number]   Chief Complaint  Patient presents with  . Diabetes  . Hypertension    HPI  She is here today for diabetes check. She wants to come off of metformin if possible. States it causes her to 'run to bathroom" too much. She reports it is difficult to leave the house b/c she never knows when she will have diarrhea.  Diabetes She presents for her follow-up diabetic visit. She has type 2 diabetes mellitus. Her disease course has been stable. There are no hypoglycemic associated symptoms. Pertinent negatives for diabetes include no blurred vision and no chest pain. There are no hypoglycemic complications. Diabetic complications include nephropathy. Risk factors for coronary artery disease include diabetes mellitus, dyslipidemia, hypertension and post-menopausal.  Hypertension This is a chronic problem. The current episode started more than 1 year ago. The problem has been gradually improving since onset. Pertinent negatives include no blurred vision, chest pain, palpitations or shortness of breath.     Past Medical History:  Diagnosis Date  . DM (diabetes mellitus) (HCC)   . Fatty liver disease, nonalcoholic   . Gastritis   . Gastroparesis   . GERD (gastroesophageal reflux disease)   . Hiatal hernia   . Hiatal hernia   . HLD (hyperlipidemia)   . HTN (hypertension)   . Renal calculus      Family History  Problem Relation Age of Onset  . Diabetes Mother   . Diabetes Maternal Grandmother   . Stomach cancer Maternal Aunt        X 2 aunts  . Hypertension Father   . Colon cancer Neg Hx   .  Esophageal cancer Neg Hx   . Rectal cancer Neg Hx      Current Outpatient Medications:  .  amLODipine (NORVASC) 10 MG tablet, TAKE 1 TABLET ONCE DAILY., Disp: 90 tablet, Rfl: 1 .  aspirin EC 81 MG tablet, Take 81 mg by mouth daily., Disp: , Rfl:  .  Blood Glucose Monitoring Suppl (ONETOUCH VERIO) w/Device KIT, Use as directed to check blood sugars 2 times per day dx: e11.65, Disp: 1 kit, Rfl: 1 .  EDARBI 80 MG TABS, TAKE 1 TABLET EACH DAY., Disp: 90 tablet, Rfl: 1 .  ferrous gluconate (FERGON) 324 MG tablet, TAKE 1 TABLET BY MOUTH TWICE DAILY WITH A MEAL., Disp: 100 tablet, Rfl: 0 .  glucose blood (ONETOUCH VERIO) test strip, Use as instructed to check blood sugars 2 times per day dx:e11.65, Disp: 150 each, Rfl: 3 .  hydrochlorothiazide (HYDRODIURIL) 12.5 MG tablet, TAKE 1 TABLET ONCE DAILY., Disp: 90 tablet, Rfl: 1 .  Insulin Glargine, 1 Unit Dial, (TOUJEO SOLOSTAR) 300 UNIT/ML SOPN, Inject 32 Units into the skin at bedtime., Disp: 4.5 mL, Rfl: 3 .  Insulin Pen Needle 32G X 6 MM MISC, Use with pen injectors daily dx code: e11.9, Disp: 10 each, Rfl: 6 .  metFORMIN (GLUCOPHAGE) 850 MG tablet, TAKE 1 TABLET BY MOUTH TWICE DAILY WITH A MEAL., Disp: 180 tablet, Rfl: 1 .  OneTouch Delica Lancets 33G MISC, Use as directed to check blood sugars 2 times per day dx:e11.65, Disp:   150 each, Rfl: 3 .  pantoprazole (PROTONIX) 40 MG tablet, TAKE 1 TABLET ONCE DAILY., Disp: 30 tablet, Rfl: 0 .  rosuvastatin (CRESTOR) 20 MG tablet, Take 1 tablet (20 mg total) by mouth daily., Disp: 90 tablet, Rfl: 1 .  temazepam (RESTORIL) 30 MG capsule, TAKE 1 CAPSULE AT BEDTIME AS NEEDED., Disp: 30 capsule, Rfl: 0   No Known Allergies   Review of Systems  Constitutional: Negative.   Eyes: Negative for blurred vision.  Respiratory: Negative.  Negative for shortness of breath.   Cardiovascular: Negative.  Negative for chest pain and palpitations.  Gastrointestinal: Negative.   Skin:       She c/o increased hair  shedding. She is not sure what is contributing to her sx. She denies having any rash in her scalp. Reports her sx started about a month ago.   Neurological: Negative.   Psychiatric/Behavioral: Negative.      Today's Vitals   05/16/19 1416  BP: (!) 144/76  Pulse: 63  Temp: 98.3 F (36.8 C)  TempSrc: Oral  Weight: 218 lb 12.8 oz (99.2 kg)  Height: 5' 3" (1.6 m)  PainSc: 0-No pain   Body mass index is 38.76 kg/m.   Wt Readings from Last 3 Encounters:  05/16/19 218 lb 12.8 oz (99.2 kg)  05/07/19 221 lb (100.2 kg)  02/08/19 225 lb (102.1 kg)     Objective:  Physical Exam Vitals and nursing note reviewed.  Constitutional:      Appearance: Normal appearance. She is obese.  HENT:     Head: Normocephalic and atraumatic.  Cardiovascular:     Rate and Rhythm: Normal rate and regular rhythm.     Heart sounds: Normal heart sounds.  Pulmonary:     Effort: Pulmonary effort is normal.     Breath sounds: Normal breath sounds.  Skin:    General: Skin is warm.     Comments: Hair thinning present. No rashes noted on scalp.   Neurological:     General: No focal deficit present.     Mental Status: She is alert.  Psychiatric:        Mood and Affect: Mood normal.        Behavior: Behavior normal.         Assessment And Plan:     1. Type 2 diabetes mellitus with stage 2 chronic kidney disease, with long-term current use of insulin (HCC)  Chronic, this has been stable. I will check labs as listed below. Importance of regular exercise was discussed with the patient. She was advised to change metformin dosing to once daily. She will let me know if her GI discomfort continues.   - Hemoglobin A1c - BMP8+EGFR  2. Hypertensive nephropathy  Chronic, fair control. She will continue with current meds for now. She is encouraged to avoid adding salt to her foods.   - hydrochlorothiazide (HYDRODIURIL) 12.5 MG tablet; Take 1 tablet (12.5 mg total) by mouth daily.  Dispense: 90 tablet;  Refill: 2  3. Insomnia, unspecified type  Chronic, yet stable.  She was given refill of temazepam nightly to use as needed.   - temazepam (RESTORIL) 30 MG capsule; One capsules po qhs prn  Dispense: 30 capsule; Refill: 1  4. Hair loss  I will check labs as listed below. I will also refer her to Derm for further evaluation.   - ANA, IFA (with reflex) - TSH - Testosterone, Total  5. Immunization due  She is encouraged to get COVID vaccine. Pt advised   we will update other vaccines once she has completed this series.   6. Class 2 severe obesity due to excess calories with serious comorbidity and body mass index (BMI) of 38.0 to 38.9 in adult (HCC)  She was congratulated on her 7 pound weight loss since Nov 2020. She is encouraged to strive for BMI less than 30 to decrease cardiac risk. She is advised to exercise no less than 30 minutes five days per week.    [name] N Sanders, MD    THE PATIENT IS ENCOURAGED TO PRACTICE SOCIAL DISTANCING DUE TO THE COVID-19 PANDEMIC.   

## 2019-05-16 NOTE — Patient Instructions (Signed)

## 2019-05-17 LAB — HEMOGLOBIN A1C
Est. average glucose Bld gHb Est-mCnc: 194 mg/dL
Hgb A1c MFr Bld: 8.4 % — ABNORMAL HIGH (ref 4.8–5.6)

## 2019-05-17 LAB — BMP8+EGFR
BUN/Creatinine Ratio: 14 (ref 12–28)
BUN: 19 mg/dL (ref 8–27)
CO2: 21 mmol/L (ref 20–29)
Calcium: 9.8 mg/dL (ref 8.7–10.3)
Chloride: 104 mmol/L (ref 96–106)
Creatinine, Ser: 1.4 mg/dL — ABNORMAL HIGH (ref 0.57–1.00)
GFR calc Af Amer: 45 mL/min/{1.73_m2} — ABNORMAL LOW (ref >59)
GFR calc non Af Amer: 39 mL/min/{1.73_m2} — ABNORMAL LOW (ref >59)
Glucose: 114 mg/dL — ABNORMAL HIGH (ref 65–99)
Potassium: 5.1 mmol/L (ref 3.5–5.2)
Sodium: 141 mmol/L (ref 134–144)

## 2019-05-17 LAB — TSH: TSH: 1.25 u[IU]/mL (ref 0.450–4.500)

## 2019-05-17 LAB — TESTOSTERONE: Testosterone: 20 ng/dL (ref 3–41)

## 2019-05-17 LAB — ANTINUCLEAR ANTIBODIES, IFA: ANA Titer 1: NEGATIVE

## 2019-05-23 ENCOUNTER — Telehealth: Payer: Self-pay

## 2019-05-23 DIAGNOSIS — S83242A Other tear of medial meniscus, current injury, left knee, initial encounter: Secondary | ICD-10-CM | POA: Diagnosis not present

## 2019-05-23 DIAGNOSIS — M1712 Unilateral primary osteoarthritis, left knee: Secondary | ICD-10-CM | POA: Diagnosis not present

## 2019-05-23 NOTE — Telephone Encounter (Signed)
-----   Message from [name], MD sent at 05/22/2019  8:47 PM EST ----- Your hba1c is 8.4, this is up from last visit at 8.2.  Please cut out bread, rice and pasta from evening meals. Please keep blood sugar log for the next several weeks. Call in blood sugars on Thursdays and Mondays. I will adjust your insulin accordingly. Your kidney function has decreased significantly. How much water do you drink? Your thyroid fxn is nl. Your testosterone level is wnl.

## 2019-05-28 ENCOUNTER — Other Ambulatory Visit: Payer: Self-pay | Admitting: Gastroenterology

## 2019-06-01 ENCOUNTER — Telehealth: Payer: Self-pay

## 2019-06-07 ENCOUNTER — Telehealth: Payer: Self-pay

## 2019-06-07 DIAGNOSIS — L649 Androgenic alopecia, unspecified: Secondary | ICD-10-CM | POA: Diagnosis not present

## 2019-06-07 DIAGNOSIS — L739 Follicular disorder, unspecified: Secondary | ICD-10-CM | POA: Diagnosis not present

## 2019-06-07 DIAGNOSIS — Z411 Encounter for cosmetic surgery: Secondary | ICD-10-CM | POA: Diagnosis not present

## 2019-06-07 NOTE — Telephone Encounter (Signed)
Pt called with blood sugar readings for the week Monday-181 Tuesday-201 Wednesday-182 Thursday-206

## 2019-06-11 ENCOUNTER — Ambulatory Visit (INDEPENDENT_AMBULATORY_CARE_PROVIDER_SITE_OTHER): Payer: Medicare Other

## 2019-06-11 DIAGNOSIS — Z794 Long term (current) use of insulin: Secondary | ICD-10-CM | POA: Diagnosis not present

## 2019-06-11 DIAGNOSIS — N182 Chronic kidney disease, stage 2 (mild): Secondary | ICD-10-CM

## 2019-06-11 DIAGNOSIS — E1122 Type 2 diabetes mellitus with diabetic chronic kidney disease: Secondary | ICD-10-CM

## 2019-06-11 NOTE — Progress Notes (Signed)
Chronic Care Management   Visit Note  06/11/2019 Name: Leslie Davis MRN: [id number] DOB: August 31, 1955  Referred by: [name], MD Reason for referral : Chronic Care Management and Diabetes   Leslie Davis is a 64 y.o. year old female who is a primary care patient of [name], MD. The CCM team was consulted for assistance with chronic disease management and care coordination needs related to DMII  Review of patient status, including review of consultants reports, relevant laboratory and other test results, and collaboration with appropriate care team members and the patient's provider was performed as part of comprehensive patient evaluation and provision of chronic care management services.    SDOH (Social Determinants of Health) assessments performed: Yes See Care Plan activities for detailed interventions related to SDOH     Medications: Outpatient Encounter Medications as of 06/11/2019  Medication Sig  . amLODipine (NORVASC) 10 MG tablet TAKE 1 TABLET ONCE DAILY.  Marland Kitchen aspirin EC 81 MG tablet Take 81 mg by mouth daily.  . Blood Glucose Monitoring Suppl (ONETOUCH VERIO) w/Device KIT Use as directed to check blood sugars 2 times per day dx: e11.65  . EDARBI 80 MG TABS TAKE 1 TABLET EACH DAY.  . ferrous gluconate (FERGON) 324 MG tablet TAKE 1 TABLET BY MOUTH TWICE DAILY WITH A MEAL.  Marland Kitchen glucose blood (ONETOUCH VERIO) test strip Use as instructed to check blood sugars 2 times per day dx:e11.65  . hydrochlorothiazide (HYDRODIURIL) 12.5 MG tablet Take 1 tablet (12.5 mg total) by mouth daily.  . Insulin Glargine, 1 Unit Dial, (TOUJEO SOLOSTAR) 300 UNIT/ML SOPN Inject 32 Units into the skin at bedtime. (Patient taking differently: Inject 34 Units into the skin at bedtime. )  . Insulin Pen Needle 32G X 6 MM MISC Use with pen injectors daily dx code: e11.9  . metFORMIN (GLUCOPHAGE) 850 MG tablet TAKE 1 TABLET BY MOUTH TWICE DAILY WITH A MEAL. (Patient taking differently: Take 850  mg by mouth. 1 tablet daily with breakfast, then 1/2 tablet with dinner)  . OneTouch Delica Lancets 96E MISC Use as directed to check blood sugars 2 times per day dx:e11.65  . pantoprazole (PROTONIX) 40 MG tablet TAKE 1 TABLET ONCE DAILY.  . rosuvastatin (CRESTOR) 20 MG tablet Take 1 tablet (20 mg total) by mouth daily.  . temazepam (RESTORIL) 30 MG capsule One capsules po qhs prn   No facility-administered encounter medications on file as of 06/11/2019.     Objective:   Goals Addressed            This Visit's Progress     Patient Stated   . I would like to continue to optimize my medication managment of my chronic conditions (pt-stated)       Current Barriers:  . Diabetes: T2DM; most recent A1c 8.4% on 04/2020 increased to 8.2% on 01/29/19 . Current antihyperglycemic regimen: Metformin, Toujeo 32 units qHS  o Previously on Ozempic '1mg'$  weekly (NOT taking Ozempic due to side effects) o Increase Toujeo to 34 units qHS . denies hypoglycemic symptoms;denies hyperglycemic symptoms . Current exercise: walking . Current blood glucose readings: FBG was 139 this AM, but can be as high as 180; pt states she is trying to eat better, etc . Cardiovascular risk reduction: o Current hypertensive regimen: amlodipine, edarbi (gave samples), hctz - BP was 150/70 (BP was 146/70 at past visit)-counseled patient to practice low salt diet, avoid soft drinks (caffeine).  Patient has been unable to check.  Encouraged her to come to  office to have checked. o Current hyperlipidemia regimen: rosuvastatin '20mg'$  daily  Pharmacist Clinical Goal(s):  Marland Kitchen Over the next 90 days, patient with work with PharmD and primary care provider to address optimized medication management of chronic conditions  Interventions: . Comprehensive medication review performed, medication list updated in electronic medical record . Comprehensive medication review performed.  Reviewed medication fill history via insurance claims data  confirming patient appears compliant with having her medications filled on time as prescribed by provider. . Reviewed & discussed the following diabetes-related information with patient: o Continue checking blood sugars as directed o Follow ADA recommended "diabetes-friendly" diet  (reviewed healthy snack/food options) o Reviewed medication purpose/side effects--> patient complains about GI symptoms from [place] and she has self-discontinued it  Patient Self Care Activities:  . Patient will check blood glucose daily, document, and provide at future appointments . Patient will focus on medication adherence by continuing to take medications as prescribed. . Patient will take medications as prescribed . Patient will contact provider with any episodes of hypoglycemia . Patient will report any questions or concerns to provider   Please see past updates related to this goal by clicking on the "Past Updates" button in the selected goal          Provider Signature Regina Eck, PharmD, [place] Pharmacist, [facility] [place]: [phone]

## 2019-06-14 NOTE — Patient Instructions (Signed)
Visit Information  Goals Addressed            This Visit's Progress     Patient Stated   . I would like to continue to optimize my medication managment of my chronic conditions (pt-stated)       Current Barriers:  . Diabetes: T2DM; most recent A1c 8.4% on 04/2020 increased to 8.2% on 01/29/19 . Current antihyperglycemic regimen: Metformin, Toujeo 32 units qHS  o Previously on Ozempic 1mg  weekly (NOT taking Ozempic due to side effects) o Increase Toujeo to 34 units qHS . denies hypoglycemic symptoms;denies hyperglycemic symptoms . Current exercise: walking . Current blood glucose readings: FBG was 139 this AM, but can be as high as 180; pt states she is trying to eat better, etc . Cardiovascular risk reduction: o Current hypertensive regimen: amlodipine, edarbi (gave samples), hctz - BP was 150/70 (BP was 146/70 at past visit)-counseled patient to practice low salt diet, avoid soft drinks (caffeine).  Patient has been unable to check.  Encouraged her to come to office to have checked. o Current hyperlipidemia regimen: rosuvastatin 20mg  daily  Pharmacist Clinical Goal(s):  Marland Kitchen Over the next 90 days, patient with work with PharmD and primary care provider to address optimized medication management of chronic conditions  Interventions: . Comprehensive medication review performed, medication list updated in electronic medical record . Comprehensive medication review performed.  Reviewed medication fill history via insurance claims data confirming patient appears compliant with having her medications filled on time as prescribed by provider. . Reviewed & discussed the following diabetes-related information with patient: o Continue checking blood sugars as directed o Follow ADA recommended "diabetes-friendly" diet  (reviewed healthy snack/food options) o Reviewed medication purpose/side effects--> patient complains about GI symptoms from [place] and she has self-discontinued it  Patient Self  Care Activities:  . Patient will check blood glucose daily, document, and provide at future appointments . Patient will focus on medication adherence by continuing to take medications as prescribed. . Patient will take medications as prescribed . Patient will contact provider with any episodes of hypoglycemia . Patient will report any questions or concerns to provider   Please see past updates related to this goal by clicking on the "Past Updates" button in the selected goal         The patient verbalized understanding of instructions provided today and declined a print copy of patient instruction materials.   SIGNATURE Regina Eck, PharmD, BCPS Clinical Pharmacist, [facility] [place]: [phone]

## 2019-06-16 ENCOUNTER — Other Ambulatory Visit: Payer: Self-pay | Admitting: Gastroenterology

## 2019-06-20 DIAGNOSIS — S83242A Other tear of medial meniscus, current injury, left knee, initial encounter: Secondary | ICD-10-CM | POA: Diagnosis not present

## 2019-06-20 DIAGNOSIS — G8918 Other acute postprocedural pain: Secondary | ICD-10-CM | POA: Diagnosis not present

## 2019-06-20 DIAGNOSIS — S83232A Complex tear of medial meniscus, current injury, left knee, initial encounter: Secondary | ICD-10-CM | POA: Diagnosis not present

## 2019-06-20 DIAGNOSIS — M6752 Plica syndrome, left knee: Secondary | ICD-10-CM | POA: Diagnosis not present

## 2019-06-20 DIAGNOSIS — M94262 Chondromalacia, left knee: Secondary | ICD-10-CM | POA: Diagnosis not present

## 2019-06-28 ENCOUNTER — Other Ambulatory Visit: Payer: Self-pay

## 2019-06-28 ENCOUNTER — Telehealth: Payer: Self-pay

## 2019-06-28 DIAGNOSIS — Z9889 Other specified postprocedural states: Secondary | ICD-10-CM | POA: Diagnosis not present

## 2019-06-28 DIAGNOSIS — M25562 Pain in left knee: Secondary | ICD-10-CM | POA: Diagnosis not present

## 2019-06-28 DIAGNOSIS — R262 Difficulty in walking, not elsewhere classified: Secondary | ICD-10-CM | POA: Diagnosis not present

## 2019-06-28 DIAGNOSIS — M25662 Stiffness of left knee, not elsewhere classified: Secondary | ICD-10-CM | POA: Diagnosis not present

## 2019-06-28 DIAGNOSIS — Z7409 Other reduced mobility: Secondary | ICD-10-CM | POA: Diagnosis not present

## 2019-06-28 MED ORDER — TOUJEO SOLOSTAR 300 UNIT/ML ~~LOC~~ SOPN: 35.0000 [IU] | PEN_INJECTOR | Freq: Every day | SUBCUTANEOUS | 3 refills | Status: DC

## 2019-06-28 NOTE — Telephone Encounter (Signed)
Left vm for pt to call back for instructions   Please advise patient to increase her insulin by 3 units. Continue to check fasting sugars. She should call Monday with new readings.

## 2019-06-28 NOTE — Telephone Encounter (Signed)
-----   Message from [name], MD sent at 06/28/2019  4:02 PM EDT ----- Please advise patient to increase her insulin by 3 units. Continue to check fasting sugars. She should call Monday with new readings.  ----- Message ----- From: Octavio Manns Sent: 06/28/2019   3:55 PM EDT To: [name], MD  Pt called with blood sugar readings. Monday 303 Tuesday 276 Wednesday 239 thurs 209

## 2019-06-28 NOTE — Telephone Encounter (Signed)
Pt called with blood sugar readings for the week Monday [phone] [id number] wed 239 thurs 209

## 2019-06-28 NOTE — Telephone Encounter (Signed)
Pt called with blood sugar reading provider adjusted medication. Increased insulin by 3 units

## 2019-07-04 ENCOUNTER — Telehealth: Payer: Self-pay

## 2019-07-05 DIAGNOSIS — M25562 Pain in left knee: Secondary | ICD-10-CM | POA: Diagnosis not present

## 2019-07-05 DIAGNOSIS — M25662 Stiffness of left knee, not elsewhere classified: Secondary | ICD-10-CM | POA: Diagnosis not present

## 2019-07-05 DIAGNOSIS — R262 Difficulty in walking, not elsewhere classified: Secondary | ICD-10-CM | POA: Diagnosis not present

## 2019-07-05 DIAGNOSIS — Z7409 Other reduced mobility: Secondary | ICD-10-CM | POA: Diagnosis not present

## 2019-07-05 DIAGNOSIS — Z9889 Other specified postprocedural states: Secondary | ICD-10-CM | POA: Diagnosis not present

## 2019-07-13 DIAGNOSIS — R262 Difficulty in walking, not elsewhere classified: Secondary | ICD-10-CM | POA: Diagnosis not present

## 2019-07-13 DIAGNOSIS — Z7409 Other reduced mobility: Secondary | ICD-10-CM | POA: Diagnosis not present

## 2019-07-13 DIAGNOSIS — Z9889 Other specified postprocedural states: Secondary | ICD-10-CM | POA: Diagnosis not present

## 2019-07-13 DIAGNOSIS — M25562 Pain in left knee: Secondary | ICD-10-CM | POA: Diagnosis not present

## 2019-07-13 DIAGNOSIS — M25662 Stiffness of left knee, not elsewhere classified: Secondary | ICD-10-CM | POA: Diagnosis not present

## 2019-07-18 ENCOUNTER — Ambulatory Visit (INDEPENDENT_AMBULATORY_CARE_PROVIDER_SITE_OTHER): Payer: Medicare Other

## 2019-07-18 ENCOUNTER — Other Ambulatory Visit: Payer: Self-pay

## 2019-07-18 ENCOUNTER — Telehealth: Payer: Self-pay

## 2019-07-18 DIAGNOSIS — E1122 Type 2 diabetes mellitus with diabetic chronic kidney disease: Secondary | ICD-10-CM

## 2019-07-18 DIAGNOSIS — N182 Chronic kidney disease, stage 2 (mild): Secondary | ICD-10-CM

## 2019-07-18 DIAGNOSIS — I129 Hypertensive chronic kidney disease with stage 1 through stage 4 chronic kidney disease, or unspecified chronic kidney disease: Secondary | ICD-10-CM

## 2019-07-18 DIAGNOSIS — Z794 Long term (current) use of insulin: Secondary | ICD-10-CM

## 2019-07-20 ENCOUNTER — Telehealth: Payer: Self-pay | Admitting: Internal Medicine

## 2019-07-20 DIAGNOSIS — M25562 Pain in left knee: Secondary | ICD-10-CM | POA: Diagnosis not present

## 2019-07-20 DIAGNOSIS — M25662 Stiffness of left knee, not elsewhere classified: Secondary | ICD-10-CM | POA: Diagnosis not present

## 2019-07-20 DIAGNOSIS — Z9889 Other specified postprocedural states: Secondary | ICD-10-CM | POA: Diagnosis not present

## 2019-07-20 DIAGNOSIS — R262 Difficulty in walking, not elsewhere classified: Secondary | ICD-10-CM | POA: Diagnosis not present

## 2019-07-20 DIAGNOSIS — Z7409 Other reduced mobility: Secondary | ICD-10-CM | POA: Diagnosis not present

## 2019-07-20 NOTE — Chronic Care Management (AMB) (Signed)
  Care Management   Note  07/20/2019 Name: Leslie Davis MRN: [id number] DOB: 29-Dec-1955  Leslie Davis is a 64 y.o. year old female who is a primary care patient of [name], MD and is actively engaged with the care management team. I reached out to [name] by phone today to assist with scheduling an initial visit with the Pharmacist in response to a referral sent by [name] primary care physician [name], MD  Follow up plan: Telephone appointment with care management team member scheduled for:08/01/2019.  [place], [place] [address] Direct Dial: [phone] [email or web address] Website: [email or web address]

## 2019-07-20 NOTE — Chronic Care Management (AMB) (Signed)
  Chronic Care Management   Outreach Note  07/20/2019 Name: Leslie Davis MRN: [id number] DOB: 01-10-1956  Leslie Davis is a 64 y.o. year old female who is a primary care patient of [name], MD. I reached out to [name] by phone today in response to a referral sent by [name]'s PCP, [name], MD.     An unsuccessful telephone outreach was attempted today. The patient was referred to the case management team for assistance with care management and care coordination.   Follow Up Plan: A HIPPA compliant phone message was left for the patient providing contact information and requesting a return call. The care management team will reach out to the patient again over the next 7 days. If patient returns call to provider office, please advise to call [place] at [phone].  [place], [place] [address] Direct Dial: [phone] [email or web address] [email or web address] Website: [email or web address]

## 2019-07-23 NOTE — Patient Instructions (Signed)
Visit Information  Goals Addressed      Patient Stated   . "I am still working with PT for strengthing in my L knee" (pt-stated)       CARE PLAN ENTRY (see longitudinal plan of care for additional care plan information)  Current Barriers:  Marland Kitchen Knowledge Deficits related to S/P left knee arthroscopy with partial (30%) medial menisectomy completed on 06/20/19  . Referring Service/Team: [id number] SPORTS MEDICINE & JOINT REPLACEMENT/Colby Leverne Humbles, PA-C . Chronic Disease Management support and education needs related to Type 2 diabetes mellitus, Hypertensive nephropathy, Chronic renal failure  Nurse Case Manager Clinical Goal(s):  Marland Kitchen Over the next 90 days, patient will work with her health care team  to address needs related to disease education and support for improved Self Health management of S/P left knee arthroscopy with partial medial menisectomy  CCM RN CM Interventions:  07/19/19 call completed with patient  . Inter-disciplinary care team collaboration (see longitudinal plan of care) . Evaluation of current treatment plan related to S/P left knee arthroscopy w/partial medial menisectomy  and patient's adherence to plan as established by provider . Determined patient is working with PT through [facility] . Reviewed and discussed the following treatment plan;  o 07/20/19: 3 weeks S/P left knee arthroscopy with partial (30%) medial menisectomy. 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 Decreased limp today. No swelling noted. No pain with therapeutic exercises. Skilled physical therapy is required to address impairments and maximize function Therapy Diagnosis:  1. Pain and swelling of left knee  2. Decreased ROM of left knee  3. Difficulty walking due to knee joint  4. Impaired functional mobility, balance, gait, and endurance  5. S/P left knee arthroscopy   - PLAN Treatment Frequency, Duration, and Interventions: Plan Treatment  Frequency and duration : 1x/week for 4 weeks - after IE (POC expires 09/01/19) Recommended Consults: None currently . Determined patient is also following a HEP as directed by PT and is attending the gym 2-3 times weekly . Discussed plans with patient for ongoing care management follow up and provided patient with direct contact information for care management team  Patient Self Care Activities:  . Self administers medications as prescribed . Attends all scheduled provider appointments . Calls pharmacy for medication refills . Performs ADL's independently . Performs IADL's independently . Calls provider office for new concerns or questions  Initial goal documentation     . "I would like to better manage my Diabetes" (pt-stated)       Current Barriers:  Marland Kitchen Knowledge Deficits related to diabetes disease process and Self Health Management   Nurse Case Manager Clinical Goal(s):  Marland Kitchen Over the next 90 days, patient will work with the CCM team to address needs related to education and support to improve Self management of DM . New - 02/12/19 - Over the next 90 days, patient will lower her A1C <8.2 Goal Not Met . New - 07/19/19 Over the next 90 days, patient will work with embedded CCM team on disease education and support to improve Self Health management of DM to help lower A1c <7.0 %  CCM RN CM Interventions:  07/19/19 call completed with patient  . Reviewed with patient her most recent A1c 8.4% on 04/2019 increased from 8.2% on 01/29/19; reviewed target A1c <7.0 % . Current antihyperglycemic regimen: Metformin, Toujeo 34 units qHS  o Previously on Ozempic 38m weekly (NOT taking Ozempic due to side effects) o Pt reports  adherence with taking medications as prescribed . denies hypoglycemic symptoms;denies hyperglycemic symptoms . Current exercise: going to gym 2-3 times weekly and working with PT includes following a HEP . Current blood glucose readings: FBG was 140 this AM, but can be as high as  180; pt states she is trying to eat better, etc . Mailed printed patient educational materials, patient will review and discuss at next RN CM follow up; Your Diabetes Care Schedule; Grocery Shopping; Diabetes Meal Planning; Diabetes and Kidneys  . Encouraged patient to continue to monitor CBG's twice daily before meals and record; Reviewed target daily glycemic control FBS 80-130 and <180 after meals . Sent new Pharmacy referral to review current medication regimen for patient and make recommendations for change in care if appropriate, pt is appreciative . Discussed plans with patient for ongoing care management follow up and provided patient with direct contact information for care management team  Patient Self Care Activities:  . Self administers medications as prescribed . Attends all scheduled provider appointments . Calls pharmacy for medication refills . Attends church or other social activities . Performs ADL's independently . Performs IADL's independently . Calls provider office for new concerns or questions  Please see past updates related to this goal by clicking on the "Past Updates" button in the selected goal      . "I would like to work on lowering my Triglycerides" (pt-stated)       Current Barriers:  Marland Kitchen Knowledge Deficits related to Self health management of Hypertriglyceridemia  Nurse Case Manager Clinical Goal(s):  Marland Kitchen Over the next 60 days, patient will work with the CCM team and PCP to address needs related to disease management of Hyperlipidemia/ Hypertriglyceridemia . Over the next 90 days, patient will lower her Triglycerides to target range; <149  CCM RN CM Interventions:  07/19/19 call completed with patient  . Evaluation of current treatment plan related to treatment management of Hypertriglyceridemia and patient's adherence to plan as established by provider . Reinforced education to patient re: disease process and treatment management including pharmacological  and Self Health management through diet, exercise, and weight management; educated patient on potential complications if untreated    . Determined lipids have not been rechecked since 11/20 . Reviewed medications with patient and discussed current hyperlipidemia regimen: rosuvastatin 38m daily . Discussed patient continues to eat better and is going to the gym 2-3 times weekly . Discussed plans with patient for ongoing care management follow up and provided patient with direct contact information for care management team  Patient Self Care Activities:  . Self administers medications as prescribed . Attends all scheduled provider appointments . Calls pharmacy for medication refills . Performs ADL's independently . Performs IADL's independently . Calls provider office for new concerns or questions  Please see past updates related to this goal by clicking on the "Past Updates" button in the selected goal      . I would like to continue to optimize my medication managment of my chronic conditions (pt-stated)       Current Barriers:  . Diabetes: T2DM; most recent A1c 8.4% on 04/2019 increased to 8.2% on 01/29/19 . Current antihyperglycemic regimen: Metformin, Toujeo 32 units qHS  o Previously on Ozempic 124mweekly (NOT taking Ozempic due to side effects) o Increase Toujeo to 34 units qHS . denies hypoglycemic symptoms;denies hyperglycemic symptoms . Current exercise: walking . Current blood glucose readings: FBG was 139 this AM, but can be as high as 180; pt states she is  trying to eat better, etc . Cardiovascular risk reduction: o Current hypertensive regimen: amlodipine, edarbi (gave samples), hctz - BP was 150/70 (BP was 146/70 at past visit)-counseled patient to practice low salt diet, avoid soft drinks (caffeine).  Patient has been unable to check.  Encouraged her to come to office to have checked. o Current hyperlipidemia regimen: rosuvastatin 31m daily  Pharmacist Clinical Goal(s):   .Marland KitchenOver the next 90 days, patient with work with PharmD and primary care provider to address optimized medication management of chronic conditions  Interventions: . Comprehensive medication review performed, medication list updated in electronic medical record . Comprehensive medication review performed.  Reviewed medication fill history via insurance claims data confirming patient appears compliant with having her medications filled on time as prescribed by provider. . Reviewed & discussed the following diabetes-related information with patient: o Continue checking blood sugars as directed o Follow ADA recommended "diabetes-friendly" diet  (reviewed healthy snack/food options) o Reviewed medication purpose/side effects--> patient complains about GI symptoms from [place] she has self-discontinued it  CCM RN CM Interventions 07/19/19 call completed with patient  . Reviewed with patient her most recent A1c 8.4% on 04/2019 increased to 8.2% on 01/29/19; Reviewed target A1c <7.0% . Current antihyperglycemic regimen: Metformin, Toujeo 34 units qHS  o Previously on Ozempic 193mweekly (NOT taking Ozempic due to side effects) o Pt reports adherence with taking medications as prescribed . denies hypoglycemic symptoms;denies hyperglycemic symptoms . Current exercise: walking and working with PT including following a HEP . Current blood glucose readings: FBG was 140 this AM, but can be as high as 180; pt states she is trying to eat better, etc . Mailed printed patient educational materials, patient will review and discuss at next RN CM follow up; Your Diabetes Care Schedule; Grocery Shopping; Diabetes Meal Planning; Diabetes and Kidneys  . Encouraged patient to continue to monitor CBG's twice daily before meals and record; Reviewed target daily glycemic control FBS 80-130 and <180 after meals . Sent new Pharmacy referral to review current medication regimen for patient and make recommendations for  change in care if appropriate, pt is appreciative  Patient Self Care Activities:  . Patient will check blood glucose daily, document, and provide at future appointments . Patient will focus on medication adherence by continuing to take medications as prescribed. . Patient will take medications as prescribed . Patient will contact provider with any episodes of hypoglycemia . Patient will report any questions or concerns to provider   Please see past updates related to this goal by clicking on the "Past Updates" button in the selected goal        Patient verbalizes understanding of instructions provided today.   Telephone follow up appointment with care management team member scheduled for: 08/14/19  AnBarb MerinoRN, BSN, CCM Care Management Coordinator [facility]  Direct Phone: [phone]

## 2019-07-23 NOTE — Chronic Care Management (AMB) (Signed)
Chronic Care Management   Follow Up Note   07/19/2019 Name: Leslie Davis MRN: [id number] DOB: 1955/09/11  Referred by: [name], MD Reason for referral : Chronic Care Management (#2 Attempt CCM RN CM Follow up on DM)   Leslie Davis is a 64 y.o. year old female who is a primary care patient of [name], MD. The CCM team was consulted for assistance with chronic disease management and care coordination needs.    Review of patient status, including review of consultants reports, relevant laboratory and other test results, and collaboration with appropriate care team members and the patient's provider was performed as part of comprehensive patient evaluation and provision of chronic care management services.    SDOH (Social Determinants of Health) assessments performed: Yes - No Acute Challenges noted at this time  See Care Plan activities for detailed interventions related to Leslie Davis)    Placed outbound call to patient for a CCM RNCM follow up.   Outpatient Encounter Medications as of 07/18/2019  Medication Sig  . insulin glargine, 1 Unit Dial, (TOUJEO SOLOSTAR) 300 UNIT/ML Solostar Pen Inject 35 Units into the skin at bedtime. (Patient taking differently: Inject 38 Units into the skin at bedtime. )  . metFORMIN (GLUCOPHAGE) 850 MG tablet TAKE 1 TABLET BY MOUTH TWICE DAILY WITH A MEAL. (Patient taking differently: Take 850 mg by mouth. 1 tablet daily with breakfast, then 1 tablet with dinner)  . amLODipine (NORVASC) 10 MG tablet TAKE 1 TABLET ONCE DAILY.  Marland Kitchen aspirin EC 81 MG tablet Take 81 mg by mouth daily.  . Blood Glucose Monitoring Suppl (ONETOUCH VERIO) w/Device KIT Use as directed to check blood sugars 2 times per day dx: e11.65  . EDARBI 80 MG TABS TAKE 1 TABLET EACH DAY.  . ferrous gluconate (FERGON) 324 MG tablet TAKE 1 TABLET BY MOUTH TWICE DAILY WITH A MEAL.  Marland Kitchen glucose blood (ONETOUCH VERIO) test strip Use as instructed to check blood sugars 2 times per day  dx:e11.65  . hydrochlorothiazide (HYDRODIURIL) 12.5 MG tablet Take 1 tablet (12.5 mg total) by mouth daily.  . Insulin Pen Needle 32G X 6 MM MISC Use with pen injectors daily dx code: e11.9  . OneTouch Delica Lancets 80D MISC Use as directed to check blood sugars 2 times per day dx:e11.65  . pantoprazole (PROTONIX) 40 MG tablet TAKE 1 TABLET ONCE DAILY.  . rosuvastatin (CRESTOR) 20 MG tablet Take 1 tablet (20 mg total) by mouth daily.  . temazepam (RESTORIL) 30 MG capsule One capsules po qhs prn   No facility-administered encounter medications on file as of 07/18/2019.     Objective:  Lab Results  Component Value Date   HGBA1C 8.4 (H) 05/16/2019   HGBA1C 8.2 (H) 02/08/2019   HGBA1C 6.8 (H) 11/08/2018   Lab Results  Component Value Date   LDLCALC 64 02/08/2019   CREATININE 1.40 (H) 05/16/2019   BP Readings from Last 3 Encounters:  05/16/19 (!) 144/76  05/07/19 (!) 157/85  02/08/19 138/76    Goals Addressed      Patient Stated   . "I am still working with PT for strengthing in my L knee" (pt-stated)       CARE PLAN ENTRY (see longitudinal plan of care for additional care plan information)  Current Barriers:  Marland Kitchen Knowledge Deficits related to S/P left knee arthroscopy with partial (30%) medial menisectomy completed on 06/20/19  . Referring Service/Team: [id number]/Colby Leverne Humbles, PA-C . Chronic Disease Management  support and education needs related to Type 2 diabetes mellitus, Hypertensive nephropathy, Chronic renal failure  Nurse Case Manager Clinical Goal(s):  Marland Kitchen Over the next 90 days, patient will work with her health care team  to address needs related to disease education and support for improved Self Health management of S/P left knee arthroscopy with partial medial menisectomy  CCM RN CM Interventions:  07/19/19 call completed with patient  . Inter-disciplinary care team collaboration (see longitudinal plan of care) . Evaluation of  current treatment plan related to S/P left knee arthroscopy w/partial medial menisectomy  and patient's adherence to plan as established by provider . Determined patient is working with PT through [facility] . Reviewed and discussed the following treatment plan;  o 07/20/19: 3 weeks S/P left knee arthroscopy with partial (30%) medial menisectomy. 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 Decreased limp today. No swelling noted. No pain with therapeutic exercises. Skilled physical therapy is required to address impairments and maximize function Therapy Diagnosis:  1. Pain and swelling of left knee  2. Decreased ROM of left knee  3. Difficulty walking due to knee joint  4. Impaired functional mobility, balance, gait, and endurance  5. S/P left knee arthroscopy   - PLAN Treatment Frequency, Duration, and Interventions: Plan Treatment Frequency and duration : 1x/week for 4 weeks - after IE (POC expires 09/01/19) Recommended Consults: None currently . Determined patient is also following a HEP as directed by PT and is attending the gym 2-3 times weekly . Discussed plans with patient for ongoing care management follow up and provided patient with direct contact information for care management team  Patient Self Care Activities:  . Self administers medications as prescribed . Attends all scheduled provider appointments . Calls pharmacy for medication refills . Performs ADL's independently . Performs IADL's independently . Calls provider office for new concerns or questions  Initial goal documentation     . "I would like to better manage my Diabetes" (pt-stated)       Current Barriers:  Marland Kitchen Knowledge Deficits related to diabetes disease process and Self Health Management   Nurse Case Manager Clinical Goal(s):  Marland Kitchen Over the next 90 days, patient will work with the CCM team to address needs related to education and support to improve Self  management of DM . New - 02/12/19 - Over the next 90 days, patient will lower her A1C <8.2 Goal Not Met . New - 07/19/19 Over the next 90 days, patient will work with embedded CCM team on disease education and support to improve Self Health management of DM to help lower A1c <7.0 %  CCM RN CM Interventions:  07/19/19 call completed with patient  . Reviewed with patient her most recent A1c 8.4% on 04/2019 increased from 8.2% on 01/29/19; reviewed target A1c <7.0 % . Current antihyperglycemic regimen: Metformin, Toujeo 34 units qHS  o Previously on Ozempic '1mg'$  weekly (NOT taking Ozempic due to side effects) o Pt reports adherence with taking medications as prescribed . denies hypoglycemic symptoms;denies hyperglycemic symptoms . Current exercise: going to gym 2-3 times weekly and working with PT includes following a HEP . Current blood glucose readings: FBG was 140 this AM, but can be as high as 180; pt states she is trying to eat better, etc . Mailed printed patient educational materials, patient will review and discuss at next RN CM follow up; Your Diabetes Care Schedule; Grocery Shopping; Diabetes Meal Planning; Diabetes and Kidneys  .  Encouraged patient to continue to monitor CBG's twice daily before meals and record; Reviewed target daily glycemic control FBS 80-130 and <180 after meals . Sent new Pharmacy referral to review current medication regimen for patient and make recommendations for change in care if appropriate, pt is appreciative . Discussed plans with patient for ongoing care management follow up and provided patient with direct contact information for care management team  Patient Self Care Activities:  . Self administers medications as prescribed . Attends all scheduled provider appointments . Calls pharmacy for medication refills . Attends church or other social activities . Performs ADL's independently . Performs IADL's independently . Calls provider office for new concerns  or questions  Please see past updates related to this goal by clicking on the "Past Updates" button in the selected goal      . "I would like to work on lowering my Triglycerides" (pt-stated)       Current Barriers:  Marland Kitchen Knowledge Deficits related to Self health management of Hypertriglyceridemia  Nurse Case Manager Clinical Goal(s):  Marland Kitchen Over the next 60 days, patient will work with the CCM team and PCP to address needs related to disease management of Hyperlipidemia/ Hypertriglyceridemia . Over the next 90 days, patient will lower her Triglycerides to target range; <149  CCM RN CM Interventions:  07/19/19 call completed with patient  . Evaluation of current treatment plan related to treatment management of Hypertriglyceridemia and patient's adherence to plan as established by provider . Reinforced education to patient re: disease process and treatment management including pharmacological and Self Health management through diet, exercise, and weight management; educated patient on potential complications if untreated    . Determined lipids have not been rechecked since 11/20 . Reviewed medications with patient and discussed current hyperlipidemia regimen: rosuvastatin '20mg'$  daily . Discussed patient continues to eat better and is going to the gym 2-3 times weekly . Discussed plans with patient for ongoing care management follow up and provided patient with direct contact information for care management team  Patient Self Care Activities:  . Self administers medications as prescribed . Attends all scheduled provider appointments . Calls pharmacy for medication refills . Performs ADL's independently . Performs IADL's independently . Calls provider office for new concerns or questions  Please see past updates related to this goal by clicking on the "Past Updates" button in the selected goal      . I would like to continue to optimize my medication managment of my chronic conditions  (pt-stated)       Current Barriers:  . Diabetes: T2DM; most recent A1c 8.4% on 04/2019 increased to 8.2% on 01/29/19 . Current antihyperglycemic regimen: Metformin, Toujeo 32 units qHS  o Previously on Ozempic '1mg'$  weekly (NOT taking Ozempic due to side effects) o Increase Toujeo to 34 units qHS . denies hypoglycemic symptoms;denies hyperglycemic symptoms . Current exercise: walking . Current blood glucose readings: FBG was 139 this AM, but can be as high as 180; pt states she is trying to eat better, etc . Cardiovascular risk reduction: o Current hypertensive regimen: amlodipine, edarbi (gave samples), hctz - BP was 150/70 (BP was 146/70 at past visit)-counseled patient to practice low salt diet, avoid soft drinks (caffeine).  Patient has been unable to check.  Encouraged her to come to office to have checked. o Current hyperlipidemia regimen: rosuvastatin '20mg'$  daily  Pharmacist Clinical Goal(s):  Marland Kitchen Over the next 90 days, patient with work with PharmD and primary care provider to address optimized medication  management of chronic conditions  Interventions: . Comprehensive medication review performed, medication list updated in electronic medical record . Comprehensive medication review performed.  Reviewed medication fill history via insurance claims data confirming patient appears compliant with having her medications filled on time as prescribed by provider. . Reviewed & discussed the following diabetes-related information with patient: o Continue checking blood sugars as directed o Follow ADA recommended "diabetes-friendly" diet  (reviewed healthy snack/food options) o Reviewed medication purpose/side effects--> patient complains about GI symptoms from [place] and she has self-discontinued it  CCM RN CM Interventions 07/19/19 call completed with patient  . Reviewed with patient her most recent A1c 8.4% on 04/2019 increased to 8.2% on 01/29/19; Reviewed target A1c <7.0% . Current  antihyperglycemic regimen: Metformin, Toujeo 34 units qHS  o Previously on Ozempic '1mg'$  weekly (NOT taking Ozempic due to side effects) o Pt reports adherence with taking medications as prescribed . denies hypoglycemic symptoms;denies hyperglycemic symptoms . Current exercise: walking and working with PT including following a HEP . Current blood glucose readings: FBG was 140 this AM, but can be as high as 180; pt states she is trying to eat better, etc . Mailed printed patient educational materials, patient will review and discuss at next RN CM follow up; Your Diabetes Care Schedule; Grocery Shopping; Diabetes Meal Planning; Diabetes and Kidneys  . Encouraged patient to continue to monitor CBG's twice daily before meals and record; Reviewed target daily glycemic control FBS 80-130 and <180 after meals . Sent new Pharmacy referral to review current medication regimen for patient and make recommendations for change in care if appropriate, pt is appreciative  Patient Self Care Activities:  . Patient will check blood glucose daily, document, and provide at future appointments . Patient will focus on medication adherence by continuing to take medications as prescribed. . Patient will take medications as prescribed . Patient will contact provider with any episodes of hypoglycemia . Patient will report any questions or concerns to provider   Please see past updates related to this goal by clicking on the "Past Updates" button in the selected goal        Plan:   Telephone follow up appointment with care management team member scheduled for: 08/14/19  Barb Merino, RN, BSN, CCM Care Management Coordinator [place] [facility] [facility]  Direct Phone: [phone]

## 2019-07-26 DIAGNOSIS — Z7409 Other reduced mobility: Secondary | ICD-10-CM | POA: Diagnosis not present

## 2019-07-26 DIAGNOSIS — R262 Difficulty in walking, not elsewhere classified: Secondary | ICD-10-CM | POA: Diagnosis not present

## 2019-07-26 DIAGNOSIS — M25562 Pain in left knee: Secondary | ICD-10-CM | POA: Diagnosis not present

## 2019-07-26 DIAGNOSIS — Z9889 Other specified postprocedural states: Secondary | ICD-10-CM | POA: Diagnosis not present

## 2019-07-26 DIAGNOSIS — M25662 Stiffness of left knee, not elsewhere classified: Secondary | ICD-10-CM | POA: Diagnosis not present

## 2019-07-31 ENCOUNTER — Ambulatory Visit: Payer: Self-pay

## 2019-07-31 ENCOUNTER — Telehealth: Payer: Self-pay

## 2019-07-31 DIAGNOSIS — E1122 Type 2 diabetes mellitus with diabetic chronic kidney disease: Secondary | ICD-10-CM

## 2019-07-31 DIAGNOSIS — N182 Chronic kidney disease, stage 2 (mild): Secondary | ICD-10-CM

## 2019-07-31 DIAGNOSIS — I129 Hypertensive chronic kidney disease with stage 1 through stage 4 chronic kidney disease, or unspecified chronic kidney disease: Secondary | ICD-10-CM

## 2019-07-31 NOTE — Chronic Care Management (AMB) (Signed)
  Chronic Care Management   Outreach Note  07/31/2019 Name: Leslie Davis MRN: [id number] DOB: 02-20-1956  Referred by: [name], MD Reason for referral : Care Coordination   SW placed an unsuccessful outbound call in an attempt to assist with care coordination needs. SW was previously contacted by Consulting civil engineer to request SW assistance in obtaining equipment under [employer]. SW left a HIPAA compliant voice message requesting a return call.  Follow Up Plan: The care management team will reach out to the patient again over the next 14 days.   Daneen Schick, BSW, CDP Social Worker, Certified Dementia Practitioner [place] [facility] [phone]

## 2019-08-01 ENCOUNTER — Other Ambulatory Visit: Payer: Self-pay

## 2019-08-01 ENCOUNTER — Ambulatory Visit: Payer: Medicare Other

## 2019-08-01 DIAGNOSIS — N182 Chronic kidney disease, stage 2 (mild): Secondary | ICD-10-CM

## 2019-08-01 DIAGNOSIS — E1122 Type 2 diabetes mellitus with diabetic chronic kidney disease: Secondary | ICD-10-CM

## 2019-08-01 DIAGNOSIS — I129 Hypertensive chronic kidney disease with stage 1 through stage 4 chronic kidney disease, or unspecified chronic kidney disease: Secondary | ICD-10-CM

## 2019-08-01 NOTE — Chronic Care Management (AMB) (Signed)
Chronic Care Management Pharmacy  Name: TAMARA MONTEITH  MRN: [id number] DOB: [birth year]  Chief Complaint/ HPI  [name],  64 y.o. , female presents for their Initial CCM visit with the clinical pharmacist In office.  PCP : [name], MD  Their chronic conditions include: Hypertension, Diabetes with CKD, Hyperlipidemia, GERD, Insomnia  Office Visits: 05/16/19 OV: Presented for DM and HTN follow up. Pt complained of GI issues with metformin. Advised to change metformin dose to once daily. Labs ordered (BMP8+EGFR, HgbA1c, ANA, IFA w/ reflex, TSH, Testosterone total). Referred to Dermatology for evaluation of hair thinning. Dietary and exercise recommendations provided.   02/08/19 OV: Presented for routine wellness examination. Labs ordered (Hgb A1c, lipid panel). EKG done with no new changes noted.   Consult Visits: 07/26/19 Orthopedic surgery OV w/ Dr. Clance Boll and PT: Presented for follow up 5 weeks post left knee arthroscopy w/ partial medial menisectomy. Pt reported pain she experienced before surgery is gone. Physical therapy goals completed. Pt to continue home exercise program. Follow up in 6 weeks.   06/26/19 Sports Medicine OV w/ C. Robbins: Presented for follow up 1 week post knee arthroscopy  and partial menisectomy and reported doing well. Pt to progress to outpatient physical therapy. Follow up in 4 weeks.    06/20/19: Left knee arthroscopy and partial medial menisectomy performed by C. Unk Lightning  05/23/19 Sports Medicine OV w/ C. Robbins: Presented for second opinion for chronic left knee pain. Past treatments have included cortisone injections and gel lubrication injection. Pt advised to wait 2 more weeks to determine if gel lubrication injection provides any symptom resolution. If no relief, pt will have knee scope for partial meniscectomy.   05/08/19 Orthopedic OV w. Dr. Case: Presented for left knee Monovisc injection for left knee osteoarthritis  05/07/19 ED  visit: Presented for injury to the right shoulder that occurred yesterday while performing weight assisted pull ups. Pt reported moderate to severe, throbbing pain. Follow up with orthopedic. Pt given sling for comfort.   05/02/19 Orthopedic OV w. Dr. Case: Presented for left knee Monovisc injection for left knee osteoarthritis  02/06/19 Physical Therapy for generalized muscle weakness, chronic left knee pain, joint stiffness, and difficulty walking (2x weekly)  CCM Encounters: 07/18/19 RN: Reviewed current treatment plans relating to acute and chronic conditions. Provided patient education.   06/11/19 PharmD: Comprehensive medication review performed. Toujeo increased to 34 units daily. Dietary and exercise recommendations provided.   03/30/19 RN: Evaluation of current treatment plans and patient education relating to disease states, diet, and exercise.   02/19/19 PharmD: Comprehensive medication review performed  02/12/19 RN: Evaluation of current treatment plans and patient education relating to disease states, diet, and exercise. Collaboration with PharmD regarding medications.  Medications: Outpatient Encounter Medications as of 08/01/2019  Medication Sig  . amLODipine (NORVASC) 10 MG tablet TAKE 1 TABLET ONCE DAILY.  Marland Kitchen aspirin EC 81 MG tablet Take 81 mg by mouth daily.  . Blood Glucose Monitoring Suppl (ONETOUCH VERIO) w/Device KIT Use as directed to check blood sugars 2 times per day dx: e11.65  . cetirizine (ZYRTEC) 10 MG tablet Take 10 mg by mouth daily. As needed for allergies  . EDARBI 80 MG TABS TAKE 1 TABLET EACH DAY.  . ferrous gluconate (FERGON) 324 MG tablet TAKE 1 TABLET BY MOUTH TWICE DAILY WITH A MEAL.  Marland Kitchen glucose blood (ONETOUCH VERIO) test strip Use as instructed to check blood sugars 2 times per day dx:e11.65  . hydrochlorothiazide (HYDRODIURIL) 12.5 MG  tablet Take 1 tablet (12.5 mg total) by mouth daily.  . insulin glargine, 1 Unit Dial, (TOUJEO SOLOSTAR) 300 UNIT/ML  Solostar Pen Inject 35 Units into the skin at bedtime. (Patient taking differently: Inject 38 Units into the skin at bedtime. )  . Insulin Pen Needle 32G X 6 MM MISC Use with pen injectors daily dx code: e11.9  . OneTouch Delica Lancets 58I MISC Use as directed to check blood sugars 2 times per day dx:e11.65  . pantoprazole (PROTONIX) 40 MG tablet TAKE 1 TABLET ONCE DAILY.  . rosuvastatin (CRESTOR) 20 MG tablet Take 1 tablet (20 mg total) by mouth daily.  . temazepam (RESTORIL) 30 MG capsule One capsules po qhs prn  . [DISCONTINUED] metFORMIN (GLUCOPHAGE) 850 MG tablet TAKE 1 TABLET BY MOUTH TWICE DAILY WITH A MEAL. (Patient taking differently: daily. )   No facility-administered encounter medications on file as of 08/01/2019.     Current Diagnosis/Assessment:  Goals Addressed            This Visit's Progress   . Pharmacy Care Plan       CARE PLAN ENTRY  Current Barriers:  . Chronic Disease Management support, education, and care coordination needs related to Hypertension, Hyperlipidemia, and Diabetes   Hypertension . Pharmacist Clinical Goal(s): o Over the next 45 days, patient will work with PharmD and providers to achieve BP goal <130/80 . Current regimen:  o Amlodipine '10mg'$  daily o Edarbi '80mg'$  daily o Hydrochlorothiazide 12.'5mg'$  daily . Interventions: o Recommend minimizing salt intake. Utilize alternative seasoning (pepper, garlic, etc) o Recommend minimizing fried food intake o Check with insurance on providing new blood pressure machine for patient . Patient self care activities - Over the next 45 days, patient will: o Check BP if symptomatic, document, and provide at future appointments o Ensure daily salt intake < 2300 mg/day o Continue exercise regimen  Hyperlipidemia . Pharmacist Clinical Goal(s): o Over the next 45 days, patient will work with PharmD and providers to maintain LDL goal < 70 . Current regimen:  o Rosuvastatin '20mg'$   daily . Interventions: o Recommend minimizing fried food intake. Consider using air fryer as alternative for traditionally frying foods o Discussed elevated triglyceride level and ways to reduce it . Patient self care activities - Over the next 45 days, patient will: o Continue exercise regimen (at least 30 minutes 5 times weekly) o Focus on a healthy diet  Diabetes . Pharmacist Clinical Goal(s): o Over the next 45 days, patient will work with PharmD and providers to achieve A1c goal <7% . Current regimen:  o Metformin '850mg'$  twice daily o Toujeo Solostar 38 units daily . Interventions: o Discussed current treatment regimen with PCP. Will consider stopping metformin and starting [place] if provider agrees . Patient self care activities - Over the next 45 days, patient will: o Check blood sugar twice daily, document, and provide at future appointments o Contact provider with any episodes of hypoglycemia  Medication management . Pharmacist Clinical Goal(s): o Over the next 45 days, patient will work with PharmD and providers to achieve optimal medication adherence . Current pharmacy: [facility] . Interventions o Comprehensive medication review performed. o Utilize [facility] for medication synchronization, packaging and delivery . Patient self care activities - Over the next 45 days, patient will: o Focus on medication adherence by medication synchronization and using adherence packaging o Take medications as prescribed o Report any questions or concerns to PharmD and/or provider(s)  Initial goal documentation  Diabetes   Recent Relevant Labs: Lab Results  Component Value Date/Time   HGBA1C 8.4 (H) 05/16/2019 03:08 PM   HGBA1C 8.2 (H) 02/08/2019 03:24 PM    Kidney Function Lab Results  Component Value Date/Time   CREATININE 1.40 (H) 05/16/2019 03:08 PM   CREATININE 0.99 01/25/2019 01:14 AM   CREATININE 0.74 08/30/2011 01:07 PM   GFR 77.09  11/17/2017 09:00 AM   GFRNONAA 39 (L) 05/16/2019 03:08 PM   GFRAA 45 (L) 05/16/2019 03:08 PM  Stage 3a CKD  Checking BG: Daily  Recent FBG Readings: Recent pre-meal BG readings:  Recent 2hr PP BG readings:   Recent HS BG readings:  Recent Random BG Readings: 193, 136, 130, 219, 201, 166  Patient has failed these meds in past: Ozempic (stopped d/t SE), Tresiba, Levemir, Novolog 70/30, Januvia Patient is currently uncontrolled on the following medications:  -Metformin '850mg'$  twice daily -Toujeo Solostar 38 units daily  Last diabetic Foot exam: 11/08/18 Last diabetic Eye exam: 11/03/18 Lab Results  Component Value Date/Time   HMDIABEYEEXA No Retinopathy 08/02/2019 12:00 AM     We discussed:  -Diet extensively  Breakfast: Egg, occasionally bacon, [place] sausage, grits, cereal  Lunch: Fish, cereal, salad  Dinner: Chicken, vegetables, occasionally rice or macaroni and  Cheese  Pt reports trying to limit starches and does not eat things like  bread everyday  -Exercise extensively  Pt reports going to the gym 3-4 times weekly (bicycle, treadmill,  weight training) for about an hour and a half each time -Pt going to check BS twice daily for the next 6 weeks and record -Denies symptoms of hypoglycemia -Metformin always causes diarrhea, consider  Plan -Continue current medications  -Discuss with PCP discontinuing metformin due side effect (diarrhea) and kidney function -Discuss with PCP adding Farxiga to patient's treatment regimen  Hypertension   Office blood pressures are  BP Readings from Last 3 Encounters:  08/13/19 (!) 150/82  05/16/19 (!) 144/76  05/07/19 (!) 157/85    Patient has failed these meds in the past: Losartan, Olmesartan/HCTZ Patient is currently uncontrolled on the following medications:  -Amlodipine '10mg'$  daily -Edarbi '80mg'$  daily -Hydrochlorothiazide 12.'5mg'$  daily  Patient checks BP at home infrequently  Patient home BP readings are ranging: N/A  We  discussed: -Diet and exercise extensively  Pt reports only using a little bit of salt on her foods  Recommended using less salt and trying other seasonings such  as garlic and pepper -Pt does not feel that her home wrist BP monitor is accurate. She is interested in obtaining new monitor -Pt does not reports any instances of headache or dizziness  Plan -Continue current medications   Hyperlipidemia   Lipid Panel     Component Value Date/Time   CHOL 162 02/08/2019 1524   TRIG 196 (H) 02/08/2019 1524   HDL 66 02/08/2019 1524   CHOLHDL 2.5 02/08/2019 1524   CHOLHDL 2.8 09/13/2007 0405   VLDL 17 09/13/2007 0405   LDLCALC 64 02/08/2019 1524   LABVLDL 32 02/08/2019 1524     The 10-year ASCVD risk score Mikey Bussing DC Jr., et al., 2013) is: 23.3%   Values used to calculate the score:     Age: 89 years     Sex: Female     Is Non-Hispanic African American: Yes     Diabetic: Yes     Tobacco smoker: No     Systolic Blood Pressure: 505 mmHg     Is BP treated: Yes     HDL Cholesterol:  66 mg/dL     Total Cholesterol: 162 mg/dL   Patient has failed these meds in past: N/A Patient is currently uncontrolled on the following medications:  -Rosuvastatin '20mg'$  daily -Aspirin '81mg'$  daily  We discussed:   -Diet and exercise extensively  Pt reports eating fried chicken and other fried foods  Recommended minimizing the fried foods she is eating and trying  to cook with an air fryer as an alternative -Triglycerides are elevated (minimize fried foods and other fatty foods)  Plan -Continue current medications  GERD   Patient has failed these meds in past: Aciphex, Zantac Patient is currently controlled on the following medications:  -Pantoprazole '40mg'$  daily  We discussed:  -Avoiding foods that trigger heartburn  Plan -Continue current medications   Anemia   Hgb: 11.3 (RR 12-15 g/dL) on 01/25/19  Patient has failed these meds in past: N/A Patient is currently controlled on the following  medications:  -Ferrous gluconate '324mg'$  twice daily with a meal  We discussed:  Denies any side effects at this time.  Plan -Continue current medications  Insomnia   Patient has failed these meds in past: N/A Patient is currently controlled on the following medications:  -Temazepam '30mg'$  at bedtime as needed  We discussed:   -Pt only takes temazepam as needed (not daily) -Denies any residual drowsiness when she wakes after taking temazepam the previous night  Plan -Continue current medications  Allergies   Patient has failed these meds in past: N/A Patient is currently controlled on the following medications:  -Cetirizine '10mg'$  daily -Fluticasone nasal spray  We discussed:   -Pt reports starting Zyrtec in April, she is allergic to dust -Pt made an appt with allergist for itching  Plan -Continue current medications  Vaccines   Reviewed and discussed patient's vaccination history.    Immunization History  Administered Date(s) Administered  . DTaP 03/26/2013  . Fluad Quad(high Dose 65+) 11/08/2018  . Influenza, High Dose Seasonal PF 11/08/2018  . Influenza-Unspecified 11/30/2017  . Moderna SARS-COVID-2 Vaccination 08/03/2019  . Td 08/20/2005    Plan -Discuss vaccination at follow up appointment  Medication Management   Pt uses [place] for all medications Uses pill box? Yes Pt endorses 90% compliance  We discussed:  -Pt reports occasionally forgetting to take her nighttime medications -The importance of taking medications daily as directed  Plan -Utilize [facility] for medication synchronization, packaging and delivery  Verbal consent obtained for [facility] enhanced pharmacy services (medication synchronization, adherence packaging, delivery coordination). A medication sync plan was created to allow patient to get all medications delivered once every 30 to 90 days per patient preference. Patient understands they have freedom to  choose pharmacy and clinical pharmacist will coordinate care between all prescribers and [facility].   Follow up: 6 week phone visit  Jannette Fogo, PharmD Clinical Pharmacist [facility] [phone]

## 2019-08-02 DIAGNOSIS — H524 Presbyopia: Secondary | ICD-10-CM | POA: Diagnosis not present

## 2019-08-02 DIAGNOSIS — H5203 Hypermetropia, bilateral: Secondary | ICD-10-CM | POA: Diagnosis not present

## 2019-08-02 DIAGNOSIS — H2513 Age-related nuclear cataract, bilateral: Secondary | ICD-10-CM | POA: Diagnosis not present

## 2019-08-02 DIAGNOSIS — E119 Type 2 diabetes mellitus without complications: Secondary | ICD-10-CM | POA: Diagnosis not present

## 2019-08-02 LAB — HM DIABETES EYE EXAM

## 2019-08-03 ENCOUNTER — Ambulatory Visit: Payer: Medicare Other | Attending: Internal Medicine

## 2019-08-03 DIAGNOSIS — Z23 Encounter for immunization: Secondary | ICD-10-CM

## 2019-08-03 NOTE — Progress Notes (Signed)
   Covid-19 Vaccination Clinic  Name:  Leslie Davis    MRN: [id number] DOB: 1955/04/24  08/03/2019  [name] was observed post Covid-19 immunization for 15 minutes without incident. She was provided with Vaccine Information Sheet and instruction to access the V-Safe system.   [name] was instructed to call 911 with any severe reactions post vaccine: Marland Kitchen Difficulty breathing  . Swelling of face and throat  . A fast heartbeat  . A bad rash all over body  . Dizziness and weakness   Immunizations Administered    Name Date Dose VIS Date Route   Moderna COVID-19 Vaccine 08/03/2019  9:51 AM 0.5 mL 02/2019 Intramuscular   Manufacturer: Moderna   Lot: [id number]   [id number]

## 2019-08-09 ENCOUNTER — Ambulatory Visit: Payer: Self-pay

## 2019-08-09 DIAGNOSIS — I129 Hypertensive chronic kidney disease with stage 1 through stage 4 chronic kidney disease, or unspecified chronic kidney disease: Secondary | ICD-10-CM

## 2019-08-09 NOTE — Patient Instructions (Signed)
Social Worker Visit Information  Goals we discussed today:  Goals Addressed            This Visit's Progress     Patient Stated   . "I would like to order a blood pressure cuff through my insurance benefit" (pt-stated)       CARE PLAN ENTRY (see longitudinal plan of care for additional care plan information)  Current Barriers:  . Limited knowledge of how to access health plan OTC (Over the counter) benefit . Chronic diagnosis of hypertensive nephropathy  Social Work Clinical Goal(s):  Marland Kitchen Over the next 10 days the patient will work with SW to contact her health plan and order a new blood pressure monitor . Over the next 30 days the patient will be more knowledgeable of how to access health plan OTC Catalog as evidenced by a greater understanding of how to order supplies in future quarters  CCM SW Interventions: Completed 08/09/19 . Inter-disciplinary care team collaboration (see longitudinal plan of care) . Successful outbound call placed to the patient to assist with ordering a blood pressure monitor . Determined the patient is not in her home at the time of today's call and requests SW contact her on a later date . Scheduled call with the patient for Monday 5/24 to assist with ordering supplies  Patient Self Care Activities:  . Patient verbalizes understanding of plan to work with SW to obtain a blood pressure monitor . Self administers medications as prescribed . Attends all scheduled provider appointments . Performs ADL's independently . Calls provider office for new concerns or questions  Initial goal documentation         Follow Up Plan: Appointment scheduled for SW follow up with client by phone on: 5/24.   Daneen Schick, BSW, CDP Social Worker, Certified Dementia Practitioner [place] [facility] [place] [facility] [phone]

## 2019-08-09 NOTE — Chronic Care Management (AMB) (Signed)
Chronic Care Management    Social Work Follow Up Note  08/09/2019 Name: COLLEENA KURTENBACH MRN: [id number] DOB: 1956-03-04  CHRISTY EHRSAM is a 64 y.o. year old female who is a primary care patient of [name], MD. The CCM team was consulted for assistance with care coordination.   Review of patient status, including review of consultants reports, other relevant assessments, and collaboration with appropriate care team members and the patient's provider was performed as part of comprehensive patient evaluation and provision of chronic care management services.    SDOH (Social Determinants of Health) assessments performed: No    Outpatient Encounter Medications as of 08/09/2019  Medication Sig  . amLODipine (NORVASC) 10 MG tablet TAKE 1 TABLET ONCE DAILY.  Marland Kitchen aspirin EC 81 MG tablet Take 81 mg by mouth daily.  . Blood Glucose Monitoring Suppl (ONETOUCH VERIO) w/Device KIT Use as directed to check blood sugars 2 times per day dx: e11.65  . EDARBI 80 MG TABS TAKE 1 TABLET EACH DAY.  . ferrous gluconate (FERGON) 324 MG tablet TAKE 1 TABLET BY MOUTH TWICE DAILY WITH A MEAL.  Marland Kitchen glucose blood (ONETOUCH VERIO) test strip Use as instructed to check blood sugars 2 times per day dx:e11.65  . hydrochlorothiazide (HYDRODIURIL) 12.5 MG tablet Take 1 tablet (12.5 mg total) by mouth daily.  . insulin glargine, 1 Unit Dial, (TOUJEO SOLOSTAR) 300 UNIT/ML Solostar Pen Inject 35 Units into the skin at bedtime. (Patient taking differently: Inject 38 Units into the skin at bedtime. )  . Insulin Pen Needle 32G X 6 MM MISC Use with pen injectors daily dx code: e11.9  . metFORMIN (GLUCOPHAGE) 850 MG tablet TAKE 1 TABLET BY MOUTH TWICE DAILY WITH A MEAL. (Patient taking differently: Take 850 mg by mouth. 1 tablet daily with breakfast, then 1 tablet with dinner)  . OneTouch Delica Lancets 41O MISC Use as directed to check blood sugars 2 times per day dx:e11.65  . pantoprazole (PROTONIX) 40 MG tablet TAKE 1  TABLET ONCE DAILY.  . rosuvastatin (CRESTOR) 20 MG tablet Take 1 tablet (20 mg total) by mouth daily.  . temazepam (RESTORIL) 30 MG capsule One capsules po qhs prn   No facility-administered encounter medications on file as of 08/09/2019.     Goals Addressed            This Visit's Progress     Patient Stated   . "I would like to order a blood pressure cuff through my insurance benefit" (pt-stated)       CARE PLAN ENTRY (see longitudinal plan of care for additional care plan information)  Current Barriers:  . Limited knowledge of how to access health plan OTC (Over the counter) benefit . Chronic diagnosis of hypertensive nephropathy  Social Work Clinical Goal(s):  Marland Kitchen Over the next 10 days the patient will work with SW to contact her health plan and order a new blood pressure monitor . Over the next 30 days the patient will be more knowledgeable of how to access health plan OTC Catalog as evidenced by a greater understanding of how to order supplies in future quarters  CCM SW Interventions: Completed 08/09/19 . Inter-disciplinary care team collaboration (see longitudinal plan of care) . Successful outbound call placed to the patient to assist with ordering a blood pressure monitor . Determined the patient is not in her home at the time of today's call and requests SW contact her on a later date . Scheduled call with the patient for Monday 5/24  to assist with ordering supplies  Patient Self Care Activities:  . Patient verbalizes understanding of plan to work with SW to obtain a blood pressure monitor . Self administers medications as prescribed . Attends all scheduled provider appointments . Performs ADL's independently . Calls provider office for new concerns or questions  Initial goal documentation         Follow Up Plan: Appointment scheduled for SW follow up with client by phone on: 5/24.  Daneen Schick, BSW, CDP Social Worker, Certified Dementia Practitioner [place] [facility]  [place] [facility] [phone]  Total time spent performing care coordination and/or care management activities with the patient by phone or face to face = 13 minutes.

## 2019-08-13 ENCOUNTER — Ambulatory Visit (INDEPENDENT_AMBULATORY_CARE_PROVIDER_SITE_OTHER): Payer: Medicare Other | Admitting: Internal Medicine

## 2019-08-13 ENCOUNTER — Encounter: Payer: Self-pay | Admitting: Internal Medicine

## 2019-08-13 ENCOUNTER — Other Ambulatory Visit: Payer: Self-pay

## 2019-08-13 ENCOUNTER — Telehealth: Payer: Self-pay | Admitting: Gastroenterology

## 2019-08-13 ENCOUNTER — Ambulatory Visit: Payer: Self-pay

## 2019-08-13 ENCOUNTER — Telehealth: Payer: Self-pay

## 2019-08-13 VITALS — BP 150/82 | HR 55 | Temp 98.2°F | Ht 63.0 in | Wt 214.4 lb

## 2019-08-13 DIAGNOSIS — Z794 Long term (current) use of insulin: Secondary | ICD-10-CM | POA: Diagnosis not present

## 2019-08-13 DIAGNOSIS — N182 Chronic kidney disease, stage 2 (mild): Secondary | ICD-10-CM

## 2019-08-13 DIAGNOSIS — I129 Hypertensive chronic kidney disease with stage 1 through stage 4 chronic kidney disease, or unspecified chronic kidney disease: Secondary | ICD-10-CM

## 2019-08-13 DIAGNOSIS — E1122 Type 2 diabetes mellitus with diabetic chronic kidney disease: Secondary | ICD-10-CM

## 2019-08-13 DIAGNOSIS — Z6837 Body mass index (BMI) 37.0-37.9, adult: Secondary | ICD-10-CM

## 2019-08-13 DIAGNOSIS — E785 Hyperlipidemia, unspecified: Secondary | ICD-10-CM | POA: Insufficient documentation

## 2019-08-13 MED ORDER — PANTOPRAZOLE SODIUM 40 MG PO TBEC: 40.0000 mg | DELAYED_RELEASE_TABLET | Freq: Every day | ORAL | 3 refills | Status: DC

## 2019-08-13 MED ORDER — DAPAGLIFLOZIN PROPANEDIOL 10 MG PO TABS: 10.0000 mg | ORAL_TABLET | Freq: Every day | ORAL | 1 refills | Status: DC

## 2019-08-13 NOTE — Patient Instructions (Addendum)
Visit Information  Goals Addressed            This Visit's Progress   . Pharmacy Care Plan       CARE PLAN ENTRY  Current Barriers:  . Chronic Disease Management support, education, and care coordination needs related to Hypertension, Hyperlipidemia, and Diabetes   Hypertension . Pharmacist Clinical Goal(s): o Over the next 45 days, patient will work with PharmD and providers to achieve BP goal <130/80 . Current regimen:  o Amlodipine 10mg  daily o Edarbi 80mg  daily o Hydrochlorothiazide 12.5mg  daily . Interventions: o Recommend minimizing salt intake. Utilize alternative seasoning (pepper, garlic, etc) o Recommend minimizing fried food intake o Check with insurance on providing new blood pressure machine for patient . Patient self care activities - Over the next 45 days, patient will: o Check BP if symptomatic, document, and provide at future appointments o Ensure daily salt intake < 2300 mg/day o Continue exercise regimen  Hyperlipidemia . Pharmacist Clinical Goal(s): o Over the next 45 days, patient will work with PharmD and providers to maintain LDL goal < 70 . Current regimen:  o Rosuvastatin 20mg  daily . Interventions: o Recommend minimizing fried food intake. Consider using air fryer as alternative for traditionally frying foods o Discussed elevated triglyceride level and ways to reduce it . Patient self care activities - Over the next 45 days, patient will: o Continue exercise regimen (at least 30 minutes 5 times weekly) o Focus on a healthy diet  Diabetes . Pharmacist Clinical Goal(s): o Over the next 45 days, patient will work with PharmD and providers to achieve A1c goal <7% . Current regimen:  o Metformin 850mg  twice daily o Toujeo Solostar 38 units daily . Interventions: o Discussed current treatment regimen with PCP. Will consider stopping metformin and starting [place] if provider agrees . Patient self care activities - Over the next 45 days, patient  will: o Check blood sugar twice daily, document, and provide at future appointments o Contact provider with any episodes of hypoglycemia  Medication management . Pharmacist Clinical Goal(s): o Over the next 45 days, patient will work with PharmD and providers to achieve optimal medication adherence . Current pharmacy: [facility] . Interventions o Comprehensive medication review performed. o Utilize [facility] for medication synchronization, packaging and delivery . Patient self care activities - Over the next 45 days, patient will: o Focus on medication adherence by medication synchronization and using adherence packaging o Take medications as prescribed o Report any questions or concerns to PharmD and/or provider(s)  Initial goal documentation        Ms. Latvala was given information about Chronic Care Management services today including:  1. CCM service includes personalized support from designated clinical staff supervised by her physician, including individualized plan of care and coordination with other care providers 2. 24/7 contact phone numbers for assistance for urgent and routine care needs. 3. Standard insurance, coinsurance, copays and deductibles apply for chronic care management only during months in which we provide at least 20 minutes of these services. Most insurances cover these services at 100%, however patients may be responsible for any copay, coinsurance and/or deductible if applicable. This service may help you avoid the need for more expensive face-to-face services. 4. Only one practitioner may furnish and bill the service in a calendar month. 5. The patient may stop CCM services at any time (effective at the end of the month) by phone call to the office staff.  Patient agreed to services and verbal consent  obtained.   The patient verbalized understanding of instructions provided today and agreed to receive a mailed copy of patient instruction  and/or educational materials. Telephone follow up appointment with pharmacy team member scheduled for: 09/12/19 @ 4:00 PM  Jannette Fogo, PharmD Clinical Pharmacist [facility] [phone]   Diabetes Mellitus and Nutrition, Adult When you have diabetes (diabetes mellitus), it is very important to have healthy eating habits because your blood sugar (glucose) levels are greatly affected by what you eat and drink. Eating healthy foods in the appropriate amounts, at about the same times every day, can help you:  Control your blood glucose.  Lower your risk of heart disease.  Improve your blood pressure.  Reach or maintain a healthy weight. Every person with diabetes is different, and each person has different needs for a meal plan. Your health care provider may recommend that you work with a diet and nutrition specialist (dietitian) to make a meal plan that is best for you. Your meal plan may vary depending on factors such as:  The calories you need.  The medicines you take.  Your weight.  Your blood glucose, blood pressure, and cholesterol levels.  Your activity level.  Other health conditions you have, such as heart or kidney disease. How do carbohydrates affect me? Carbohydrates, also called carbs, affect your blood glucose level more than any other type of food. Eating carbs naturally raises the amount of glucose in your blood. Carb counting is a method for keeping track of how many carbs you eat. Counting carbs is important to keep your blood glucose at a healthy level, especially if you use insulin or take certain oral diabetes medicines. It is important to know how many carbs you can safely have in each meal. This is different for every person. Your dietitian can help you calculate how many carbs you should have at each meal and for each snack. Foods that contain carbs include:  Bread, cereal, rice, pasta, and crackers.  Potatoes and corn.  Peas,  beans, and lentils.  Milk and yogurt.  Fruit and juice.  Desserts, such as cakes, cookies, ice cream, and candy. How does alcohol affect me? Alcohol can cause a sudden decrease in blood glucose (hypoglycemia), especially if you use insulin or take certain oral diabetes medicines. Hypoglycemia can be a life-threatening condition. Symptoms of hypoglycemia (sleepiness, dizziness, and confusion) are similar to symptoms of having too much alcohol. If your health care provider says that alcohol is safe for you, follow these guidelines:  Limit alcohol intake to no more than 1 drink per day for nonpregnant women and 2 drinks per day for men. One drink equals 12 oz of beer, 5 oz of wine, or 1 oz of hard liquor.  Do not drink on an empty stomach.  Keep yourself hydrated with water, diet soda, or unsweetened iced tea.  Keep in mind that regular soda, juice, and other mixers may contain a lot of sugar and must be counted as carbs. What are tips for following this plan?  Reading food labels  Start by checking the serving size on the "Nutrition Facts" label of packaged foods and drinks. The amount of calories, carbs, fats, and other nutrients listed on the label is based on one serving of the item. Many items contain more than one serving per package.  Check the total grams (g) of carbs in one serving. You can calculate the number of servings of carbs in one serving by dividing the total carbs by  15. For example, if a food has 30 g of total carbs, it would be equal to 2 servings of carbs.  Check the number of grams (g) of saturated and trans fats in one serving. Choose foods that have low or no amount of these fats.  Check the number of milligrams (mg) of salt (sodium) in one serving. Most people should limit total sodium intake to less than 2,300 mg per day.  Always check the nutrition information of foods labeled as "low-fat" or "nonfat". These foods may be higher in added sugar or refined carbs  and should be avoided.  Talk to your dietitian to identify your daily goals for nutrients listed on the label. Shopping  Avoid buying canned, premade, or processed foods. These foods tend to be high in fat, sodium, and added sugar.  Shop around the outside edge of the grocery store. This includes fresh fruits and vegetables, bulk grains, fresh meats, and fresh dairy. Cooking  Use low-heat cooking methods, such as baking, instead of high-heat cooking methods like deep frying.  Cook using healthy oils, such as olive, canola, or sunflower oil.  Avoid cooking with butter, cream, or high-fat meats. Meal planning  Eat meals and snacks regularly, preferably at the same times every day. Avoid going long periods of time without eating.  Eat foods high in fiber, such as fresh fruits, vegetables, beans, and whole grains. Talk to your dietitian about how many servings of carbs you can eat at each meal.  Eat 4-6 ounces (oz) of lean protein each day, such as lean meat, chicken, fish, eggs, or tofu. One oz of lean protein is equal to: ? 1 oz of meat, chicken, or fish. ? 1 egg. ?  cup of tofu.  Eat some foods each day that contain healthy fats, such as avocado, nuts, seeds, and fish. Lifestyle  Check your blood glucose regularly.  Exercise regularly as told by your health care provider. This may include: ? 150 minutes of moderate-intensity or vigorous-intensity exercise each week. This could be brisk walking, biking, or water aerobics. ? Stretching and doing strength exercises, such as yoga or weightlifting, at least 2 times a week.  Take medicines as told by your health care provider.  Do not use any products that contain nicotine or tobacco, such as cigarettes and e-cigarettes. If you need help quitting, ask your health care provider.  Work with a Social worker or diabetes educator to identify strategies to manage stress and any emotional and social challenges. Questions to ask a health care  provider  Do I need to meet with a diabetes educator?  Do I need to meet with a dietitian?  What number can I call if I have questions?  When are the best times to check my blood glucose? Where to find more information:  American Diabetes Association: diabetes.org  Academy of Nutrition and Dietetics: www.eatright.[employer] of Diabetes and Digestive and Kidney Diseases (NIH): [employer] Summary  A healthy meal plan will help you control your blood glucose and maintain a healthy lifestyle.  Working with a diet and nutrition specialist (dietitian) can help you make a meal plan that is best for you.  Keep in mind that carbohydrates (carbs) and alcohol have immediate effects on your blood glucose levels. It is important to count carbs and to use alcohol carefully. This information is not intended to replace advice given to you by your health care provider. Make sure you discuss any questions you have with your health care provider.  Document Revised: 02/18/2017 Document Reviewed: 04/12/2016 Elsevier Patient Education  [place].  High Triglycerides Eating Plan Triglycerides are a type of fat in the blood. High levels of triglycerides can increase your risk of heart disease and stroke. If your triglyceride levels are high, choosing the right foods can help lower your triglycerides and keep your heart healthy. Work with your health care provider or a diet and nutrition specialist (dietitian) to develop an eating plan that is right for you. What are tips for following this plan? General guidelines   Lose weight, if you are overweight. For most people, losing 5-10 lbs (2-5 kg) helps lower triglyceride levels. A weight-loss plan may include. ? 30 minutes of exercise at least 5 days a week. ? Reducing the amount of calories, sugar, and fat you eat.  Eat a wide variety of fresh fruits, vegetables, and whole grains. These foods are high in fiber.  Eat foods that  contain healthy fats, such as fatty fish, nuts, seeds, and olive oil.  Avoid foods that are high in added sugar, added salt (sodium), saturated fat, and trans fat.  Avoid low-fiber, refined carbohydrates such as white bread, crackers, noodles, and white rice.  Avoid foods with partially hydrogenated oils (trans fats), such as fried foods or stick margarine.  Limit alcohol intake to no more than 1 drink a day for nonpregnant women and 2 drinks a day for men. One drink equals 12 oz of beer, 5 oz of wine, or 1 oz of hard liquor. Your health care provider may recommend that you drink less depending on your overall health. Reading food labels  Check food labels for the amount of saturated fat. Choose foods with no or very little saturated fat.  Check food labels for the amount of trans fat. Choose foods with no trans fat.  Check food labels for the amount of cholesterol. Choose foods low in cholesterol. Ask your dietitian how much cholesterol you should have each day.  Check food labels for the amount of sodium. Choose foods with less than 140 milligrams (mg) per serving. Shopping  Buy dairy products labeled as nonfat (skim) or low-fat (1%).  Avoid buying processed or prepackaged foods. These are often high in added sugar, sodium, and fat. Cooking  Choose healthy fats when cooking, such as olive oil or canola oil.  Cook foods using lower fat methods, such as baking, broiling, boiling, or grilling.  Make your own sauces, dressings, and marinades when possible, instead of buying them. Store-bought sauces, dressings, and marinades are often high in sodium and sugar. Meal planning  Eat more home-cooked food and less restaurant, buffet, and fast food.  Eat fatty fish at least 2 times each week. Examples of fatty fish include salmon, trout, mackerel, tuna, and herring.  If you eat whole eggs, do not eat more than 3 egg yolks per week. What foods are recommended? The items listed may not  be a complete list. Talk with your dietitian about what dietary choices are best for you. Grains Whole wheat or whole grain breads, crackers, cereals, and pasta. Unsweetened oatmeal. Bulgur. Barley. Quinoa. Brown rice. Whole wheat flour tortillas. Vegetables Fresh or frozen vegetables. Low-sodium canned vegetables. Fruits All fresh, canned (in natural juice), or frozen fruits. Meats and other protein foods Skinless chicken or [place]. Ground chicken or [place]. Lean cuts of pork, trimmed of fat. Fish and seafood, especially salmon, trout, and herring. Egg whites. Dried beans, peas, or lentils. Unsalted nuts or seeds. Unsalted canned beans. Natural  peanut or almond butter. Dairy Low-fat dairy products. Skim or low-fat (1%) milk. Reduced fat (2%) and low-sodium cheese. Low-fat ricotta cheese. Low-fat cottage cheese. Plain, low-fat yogurt. Fats and oils Tub margarine without trans fats. Light or reduced-fat mayonnaise. Light or reduced-fat salad dressings. Avocado. Safflower, olive, sunflower, soybean, and canola oils. What foods are not recommended? The items listed may not be a complete list. Talk with your dietitian about what dietary choices are best for you. Grains White bread. White (regular) pasta. White rice. Cornbread. Bagels. Pastries. Crackers that contain trans fat. Vegetables Creamed or fried vegetables. Vegetables in a cheese sauce. Fruits Sweetened dried fruit. Canned fruit in syrup. Fruit juice. Meats and other protein foods Fatty cuts of meat. Ribs. Chicken wings. Berniece Salines. Sausage. Bologna. Salami. Chitterlings. Fatback. Hot dogs. Bratwurst. Packaged lunch meats. Dairy Whole or reduced-fat (2%) milk. Half-and-half. Cream cheese. Full-fat or sweetened yogurt. Full-fat cheese. Nondairy creamers. Whipped toppings. Processed cheese or cheese spreads. Cheese curds. Beverages Alcohol. Sweetened drinks, such as soda, lemonade, fruit drinks, or punches. Fats and oils Butter. Stick  margarine. Lard. Shortening. Ghee. Bacon fat. Tropical oils, such as coconut, palm kernel, or palm oils. Sweets and desserts Corn syrup. Sugars. Honey. Molasses. Candy. Jam and jelly. Syrup. Sweetened cereals. Cookies. Pies. Cakes. Donuts. Muffins. Ice cream. Condiments Store-bought sauces, dressings, and marinades that are high in sugar, such as ketchup and barbecue sauce. Summary  High levels of triglycerides can increase the risk of heart disease and stroke. Choosing the right foods can help lower your triglycerides.  Eat plenty of fresh fruits, vegetables, and whole grains. Choose low-fat dairy and lean meats. Eat fatty fish at least twice a week.  Avoid processed and prepackaged foods with added sugar, sodium, saturated fat, and trans fat.  If you need suggestions or have questions about what types of food are good for you, talk with your health care provider or a dietitian. This information is not intended to replace advice given to you by your health care provider. Make sure you discuss any questions you have with your health care provider. Document Revised: 02/18/2017 Document Reviewed: 05/11/2016 Elsevier Patient Education  2020 [employer].

## 2019-08-13 NOTE — Patient Instructions (Signed)
STOP METFORMIN  START FARXIGA 10MG  DAILY. THIS FOR DIABETES AND MAY HELP WITH YOUR BLOOD PRESSURE AS WELL.

## 2019-08-13 NOTE — Progress Notes (Signed)
This visit occurred during the SARS-CoV-2 public health emergency.  Safety protocols were in place, including screening questions prior to the visit, additional usage of staff PPE, and extensive cleaning of exam room while observing appropriate contact time as indicated for disinfecting solutions.  Subjective:     Patient ID: Leslie Davis , female    DOB: 1955/06/02 , 64 y.o.   MRN: [id number]   Chief Complaint  Patient presents with  . Diabetes    HPI  She is here today for diabetes check. She wants to come off of metformin if possible. States it causes her to 'run to bathroom" too much. She reports it is difficult to leave the house b/c she never knows when she will have diarrhea. Her sx did not improve when she cut back to once daily. She wants to stop metformin all together.   Diabetes She presents for her follow-up diabetic visit. She has type 2 diabetes mellitus. Her disease course has been stable. There are no hypoglycemic associated symptoms. Pertinent negatives for diabetes include no blurred vision. There are no hypoglycemic complications. Diabetic complications include nephropathy. Risk factors for coronary artery disease include diabetes mellitus, dyslipidemia, hypertension and post-menopausal.  Hypertension This is a chronic problem. The current episode started more than 1 year ago. The problem has been gradually improving since onset. Pertinent negatives include no blurred vision. Risk factors for coronary artery disease include dyslipidemia and diabetes mellitus. The current treatment provides moderate improvement.     Past Medical History:  Diagnosis Date  . DM (diabetes mellitus) ([place])   . Fatty liver disease, nonalcoholic   . Gastritis   . Gastroparesis   . GERD (gastroesophageal reflux disease)   . Hiatal hernia   . Hiatal hernia   . HLD (hyperlipidemia)   . HTN (hypertension)   . Renal calculus      Family History  Problem Relation Age of Onset  .  Diabetes Mother   . Diabetes Maternal Grandmother   . Stomach cancer Maternal Aunt        X 2 aunts  . Hypertension Father   . Colon cancer Neg Hx   . Esophageal cancer Neg Hx   . Rectal cancer Neg Hx      Current Outpatient Medications:  .  amLODipine (NORVASC) 10 MG tablet, TAKE 1 TABLET ONCE DAILY., Disp: 90 tablet, Rfl: 1 .  aspirin EC 81 MG tablet, Take 81 mg by mouth daily., Disp: , Rfl:  .  Blood Glucose Monitoring Suppl (ONETOUCH VERIO) w/Device KIT, Use as directed to check blood sugars 2 times per day dx: e11.65, Disp: 1 kit, Rfl: 1 .  cetirizine (ZYRTEC) 10 MG tablet, Take 10 mg by mouth daily. As needed for allergies, Disp: , Rfl:  .  EDARBI 80 MG TABS, TAKE 1 TABLET EACH DAY., Disp: 90 tablet, Rfl: 1 .  ferrous gluconate (FERGON) 324 MG tablet, TAKE 1 TABLET BY MOUTH TWICE DAILY WITH A MEAL., Disp: 100 tablet, Rfl: 0 .  glucose blood (ONETOUCH VERIO) test strip, Use as instructed to check blood sugars 2 times per day dx:e11.65, Disp: 150 each, Rfl: 3 .  hydrochlorothiazide (HYDRODIURIL) 12.5 MG tablet, Take 1 tablet (12.5 mg total) by mouth daily., Disp: 90 tablet, Rfl: 2 .  insulin glargine, 1 Unit Dial, (TOUJEO SOLOSTAR) 300 UNIT/ML Solostar Pen, Inject 35 Units into the skin at bedtime. (Patient taking differently: Inject 38 Units into the skin at bedtime. ), Disp: 4.5 mL, Rfl: 3 .  Insulin Pen  Needle 32G X 6 MM MISC, Use with pen injectors daily dx code: e11.9, Disp: 10 each, Rfl: 6 .  OneTouch Delica Lancets 54G MISC, Use as directed to check blood sugars 2 times per day dx:e11.65, Disp: 150 each, Rfl: 3 .  rosuvastatin (CRESTOR) 20 MG tablet, Take 1 tablet (20 mg total) by mouth daily., Disp: 90 tablet, Rfl: 1 .  temazepam (RESTORIL) 30 MG capsule, One capsules po qhs prn, Disp: 30 capsule, Rfl: 1 .  pantoprazole (PROTONIX) 40 MG tablet, Take 1 tablet (40 mg total) by mouth daily., Disp: 30 tablet, Rfl: 3   No Known Allergies   Review of Systems  Constitutional:  Negative.   Eyes: Negative for blurred vision.  Respiratory: Negative.   Cardiovascular: Negative.   Gastrointestinal: Negative.   Neurological: Negative.   Psychiatric/Behavioral: Negative.      Today's Vitals   08/13/19 1110  BP: (!) 150/82  Pulse: (!) 55  Temp: 98.2 F (36.8 C)  TempSrc: Oral  Weight: 214 lb 6.4 oz (97.3 kg)  Height: '5\' 3"'$  (1.6 m)  PainSc: 0-No pain   Body mass index is 37.98 kg/m.   Objective:  Physical Exam Vitals and nursing note reviewed.  Constitutional:      Appearance: Normal appearance. She is obese.  HENT:     Head: Normocephalic and atraumatic.  Cardiovascular:     Rate and Rhythm: Normal rate and regular rhythm.     Heart sounds: Normal heart sounds.  Pulmonary:     Effort: Pulmonary effort is normal.     Breath sounds: Normal breath sounds.  Skin:    General: Skin is warm.  Neurological:     General: No focal deficit present.     Mental Status: She is alert.  Psychiatric:        Mood and Affect: Mood normal.        Behavior: Behavior normal.         Assessment And Plan:     1. Type 2 diabetes mellitus with stage 2 chronic kidney disease, with long-term current use of insulin (HCC)  Chronic. I will d/c metformin. I will start [place] '10mg'$  once dally. Possible side effects were discussed with the patient. She is advised to stay well hydrated while on this medication. She agrees to rto in 4 weeks for re-evaluation. I will check meds as indicated below.   - Hemoglobin A1c - BMP8+EGFR - Lipid panel  2. Hypertensive nephropathy  Chronic, uncontrolled. She will continue with current meds for now. I will consider discontinuing hctz at her next visit. She is encouraged to avoid adding salt to her foods.   3. Class 2 severe obesity due to excess calories with serious comorbidity and body mass index (BMI) of 37.0 to 37.9 in adult [facility])  She is encouraged to strive for BMI less than 30 to decrease cardiac risk. Importance of regular  exercise was discussed with the patient. She is advised to aim for at least 150 minutes per week.   [name], MD    THE PATIENT IS ENCOURAGED TO PRACTICE SOCIAL DISTANCING DUE TO THE COVID-19 PANDEMIC.

## 2019-08-13 NOTE — Chronic Care Management (AMB) (Signed)
  Chronic Care Management   Outreach Note  08/13/2019 Name: Leslie Davis MRN: [id number] DOB: 1955-08-23  Referred by: [name], MD Reason for referral : Care Coordination   SW placed an unsuccessful outbound call to the patient to assist with ordering a blood pressure monitor. SW left a HIPAA compliant voice message requesting a return call.  Follow Up Plan: The care management team will reach out to the patient again over the next 10 days.   Daneen Schick, BSW, CDP Social Worker, Certified Dementia Practitioner [facility] [place] [phone]

## 2019-08-13 NOTE — Telephone Encounter (Signed)
Refill sent to pharmacy.   

## 2019-08-14 ENCOUNTER — Telehealth: Payer: Self-pay

## 2019-08-14 ENCOUNTER — Ambulatory Visit (INDEPENDENT_AMBULATORY_CARE_PROVIDER_SITE_OTHER): Payer: Medicare Other

## 2019-08-14 DIAGNOSIS — I129 Hypertensive chronic kidney disease with stage 1 through stage 4 chronic kidney disease, or unspecified chronic kidney disease: Secondary | ICD-10-CM

## 2019-08-14 DIAGNOSIS — N182 Chronic kidney disease, stage 2 (mild): Secondary | ICD-10-CM

## 2019-08-14 DIAGNOSIS — E1122 Type 2 diabetes mellitus with diabetic chronic kidney disease: Secondary | ICD-10-CM

## 2019-08-14 DIAGNOSIS — Z794 Long term (current) use of insulin: Secondary | ICD-10-CM

## 2019-08-14 LAB — BMP8+EGFR
BUN/Creatinine Ratio: 16 (ref 12–28)
BUN: 17 mg/dL (ref 8–27)
CO2: 22 mmol/L (ref 20–29)
Calcium: 9.8 mg/dL (ref 8.7–10.3)
Chloride: 104 mmol/L (ref 96–106)
Creatinine, Ser: 1.07 mg/dL — ABNORMAL HIGH (ref 0.57–1.00)
GFR calc Af Amer: 63 mL/min/{1.73_m2} (ref >59)
GFR calc non Af Amer: 55 mL/min/{1.73_m2} — ABNORMAL LOW (ref >59)
Glucose: 163 mg/dL — ABNORMAL HIGH (ref 65–99)
Potassium: 4.9 mmol/L (ref 3.5–5.2)
Sodium: 142 mmol/L (ref 134–144)

## 2019-08-14 LAB — HEMOGLOBIN A1C
Est. average glucose Bld gHb Est-mCnc: 229 mg/dL
Hgb A1c MFr Bld: 9.6 % — ABNORMAL HIGH (ref 4.8–5.6)

## 2019-08-14 LAB — LIPID PANEL
Chol/HDL Ratio: 2.2 ratio (ref 0.0–4.4)
Cholesterol, Total: 165 mg/dL (ref 100–199)
HDL: 76 mg/dL (ref >39)
LDL Chol Calc (NIH): 70 mg/dL (ref 0–99)
Triglycerides: 106 mg/dL (ref 0–149)
VLDL Cholesterol Cal: 19 mg/dL (ref 5–40)

## 2019-08-19 ENCOUNTER — Telehealth: Payer: Self-pay

## 2019-08-19 NOTE — Telephone Encounter (Signed)
-----   Message from [name], MD sent at 08/15/2019  9:06 PM EDT ----- Here are your lab results:  Your hba1c has gone up to 9.6, what happened? Pls review meds, is she taking them all? Confirm units for insulin please.   Your cholesterol looks great. Continue with current meds.   Please let me know if you have any questions or concerns. Stay safe!   Sincerely,    [name] N. [facility]aird [facility], MD

## 2019-08-19 NOTE — Telephone Encounter (Signed)
Left vm for pt to return call for lab results  

## 2019-08-21 ENCOUNTER — Telehealth: Payer: Self-pay

## 2019-08-21 NOTE — Telephone Encounter (Signed)
-----   Message from [name], MD sent at 08/15/2019  9:06 PM EDT ----- Here are your lab results:  Your hba1c has gone up to 9.6, what happened? Pls review meds, is she taking them all? Confirm units for insulin please.   Your cholesterol looks great. Continue with current meds.   Please let me know if you have any questions or concerns. Stay safe!   Sincerely,    [name] N. [facility]aird [facility], MD

## 2019-08-21 NOTE — Telephone Encounter (Signed)
Left vm for pt to return call for lab results  

## 2019-08-22 ENCOUNTER — Telehealth: Payer: Self-pay

## 2019-08-22 ENCOUNTER — Ambulatory Visit: Payer: Self-pay

## 2019-08-22 DIAGNOSIS — I129 Hypertensive chronic kidney disease with stage 1 through stage 4 chronic kidney disease, or unspecified chronic kidney disease: Secondary | ICD-10-CM

## 2019-08-22 NOTE — Chronic Care Management (AMB) (Signed)
  Chronic Care Management   Outreach Note  08/22/2019 Name: Leslie Davis MRN: [id number] DOB: 1955-11-09  Referred by: [name], MD Reason for referral : Care Coordination   SW placed second unsuccessful outbound call to the patient in an effort to assist with obtaining DME supplies through health plan benefit. SW left HIPAA compliant voice message requesting a return call.  Follow Up Plan: SW will attempt a third outreach over the next 10 days.  Daneen Schick, BSW, CDP Social Worker, Certified Dementia Practitioner [place] [facility] [place] [facility] [phone]

## 2019-08-24 NOTE — Chronic Care Management (AMB) (Signed)
Chronic Care Management   Follow Up Note   08/15/2019 Name: Leslie Davis MRN: [id number] DOB: [birth year]  Referred by: [name], MD Reason for referral : Chronic Care Management (FU RN CM Call )   Leslie Davis is a 64 y.o. year old female who is a primary care patient of [name], MD. The CCM team was consulted for assistance with chronic disease management and care coordination needs.    Review of patient status, including review of consultants reports, relevant laboratory and other test results, and collaboration with appropriate care team members and the patient's provider was performed as part of comprehensive patient evaluation and provision of chronic care management services.    SDOH (Social Determinants of Health) assessments performed: Yes - No Acute Challenges identified at this time  See Care Plan activities for detailed interventions related to [facility])   Placed outbound call to patient for a CCM RN CM care plan update.     Outpatient Encounter Medications as of 08/14/2019  Medication Sig  . dapagliflozin propanediol (FARXIGA) 10 MG TABS tablet Take 1 tablet (10 mg total) by mouth daily before breakfast.  . insulin glargine, 1 Unit Dial, (TOUJEO SOLOSTAR) 300 UNIT/ML Solostar Pen Inject 35 Units into the skin at bedtime. (Patient taking differently: Inject 38 Units into the skin at bedtime. )  . amLODipine (NORVASC) 10 MG tablet TAKE 1 TABLET ONCE DAILY.  Marland Kitchen aspirin EC 81 MG tablet Take 81 mg by mouth daily.  . Blood Glucose Monitoring Suppl (ONETOUCH VERIO) w/Device KIT Use as directed to check blood sugars 2 times per day dx: e11.65  . cetirizine (ZYRTEC) 10 MG tablet Take 10 mg by mouth daily. As needed for allergies  . EDARBI 80 MG TABS TAKE 1 TABLET EACH DAY.  . ferrous gluconate (FERGON) 324 MG tablet TAKE 1 TABLET BY MOUTH TWICE DAILY WITH A MEAL.  Marland Kitchen glucose blood (ONETOUCH VERIO) test strip Use as instructed to check blood sugars 2 times per day  dx:e11.65  . hydrochlorothiazide (HYDRODIURIL) 12.5 MG tablet Take 1 tablet (12.5 mg total) by mouth daily.  . Insulin Pen Needle 32G X 6 MM MISC Use with pen injectors daily dx code: e11.9  . OneTouch Delica Lancets 50N MISC Use as directed to check blood sugars 2 times per day dx:e11.65  . pantoprazole (PROTONIX) 40 MG tablet Take 1 tablet (40 mg total) by mouth daily.  . rosuvastatin (CRESTOR) 20 MG tablet Take 1 tablet (20 mg total) by mouth daily.  . temazepam (RESTORIL) 30 MG capsule One capsules po qhs prn   No facility-administered encounter medications on file as of 08/14/2019.     Objective:  Lab Results  Component Value Date   HGBA1C 9.6 (H) 08/13/2019   HGBA1C 8.4 (H) 05/16/2019   HGBA1C 8.2 (H) 02/08/2019   Lab Results  Component Value Date   LDLCALC 70 08/13/2019   CREATININE 1.07 (H) 08/13/2019   BP Readings from Last 3 Encounters:  08/13/19 (!) 150/82  05/16/19 (!) 144/76  05/07/19 (!) 157/85    Goals Addressed      Patient Stated   . "I would like to better manage my Diabetes" (pt-stated)       Current Barriers:  Marland Kitchen Knowledge Deficits related to diabetes disease process and Self Health Management   Nurse Case Manager Clinical Goal(s):  Marland Kitchen Over the next 90 days, patient will work with the CCM team to address needs related to education and support to improve Self management of  DM . New - 02/12/19 - Over the next 90 days, patient will lower her A1C <8.2 Goal Not Met . New - 07/19/19 Over the next 90 days, patient will work with embedded CCM team on disease education and support to improve Self Health management of DM to help lower A1c <7.0 %  CCM RN CM Interventions:  08/14/19 call completed with patient  . Reviewed with patient her most recent A1c has increased from 8.4% to 9.6 % . Reinforced importance of lowering A1c with target A1c < 7.0 % and daily glycemic target for FBS 80-130 and or <180 after meals . Reviewed and discussed current antihyperglycemic  regimen: Metformin, Toujeo 34 units qHS  o Previously on Ozempic '1mg'$  weekly (NOT taking Ozempic due to side effects) o Pt reports adherence with taking medications as prescribed . denies hypoglycemic symptoms;denies hyperglycemic symptoms . Current exercise: going to gym 2-3 times weekly and working with PT includes following a HEP . Current blood glucose readings: FBG 140-180; pt states she is trying to eat better, etc . Encouraged patient to continue to monitor CBG's twice daily before meals and record and to bring in recorded readings to next PCP f/u . Discussed next PCP OV is scheduled for 09/11/19 with Dr. Baird Cancer, next embedded Pharm D telephone visit is scheduled for 09/12/19 . Sent in basket message to embedded Pharm D to alert of increased A1c and next scheduled OV with PCP . Discussed plans with patient for ongoing care management follow up and provided patient with direct contact information for care management team  Patient Self Care Activities:  . Self administers medications as prescribed . Attends all scheduled provider appointments . Calls pharmacy for medication refills . Attends church or other social activities . Performs ADL's independently . Performs IADL's independently . Calls provider office for new concerns or questions  Please see past updates related to this goal by clicking on the "Past Updates" button in the selected goal      . COMPLETED: "I would like to work on lowering my Triglycerides" (pt-stated)       Current Barriers:  Marland Kitchen Knowledge Deficits related to Self health management of Hypertriglyceridemia  Nurse Case Manager Clinical Goal(s):  Marland Kitchen Over the next 60 days, patient will work with the CCM team and PCP to address needs related to disease management of Hyperlipidemia/ Hypertriglyceridemia Goal Met . Over the next 90 days, patient will lower her Triglycerides to target range; <149 Goal Met  CCM RN CM Interventions:  08/14/19 call completed with patient   . Evaluation of current treatment plan related to treatment management of Hypertriglyceridemia and patient's adherence to plan as established by provider . Discussed and reviewed most current lipid panel; Determined each lipid result is WNL and patient continues to report adherence with taking Rosuvastatin as prescribed, she is eating a low fat diet and going to the gym 2-3 times per week  . Discussed plans with patient for ongoing care management follow up and provided patient with direct contact information for care management team  Patient Self Care Activities:  . Self administers medications as prescribed . Attends all scheduled provider appointments . Calls pharmacy for medication refills . Performs ADL's independently . Performs IADL's independently . Calls provider office for new concerns or questions  Please see past updates related to this goal by clicking on the "Past Updates" button in the selected goal      . COMPLETED: I would like to continue to optimize my medication managment of  my chronic conditions (pt-stated)       Current Barriers:  . Diabetes: T2DM; most recent A1c 8.4% on 04/2019 increased to 8.2% on 01/29/19 . Current antihyperglycemic regimen: Metformin, Toujeo 32 units qHS  o Previously on Ozempic '1mg'$  weekly (NOT taking Ozempic due to side effects) o Increase Toujeo to 34 units qHS . denies hypoglycemic symptoms;denies hyperglycemic symptoms . Current exercise: walking . Current blood glucose readings: FBG was 139 this AM, but can be as high as 180; pt states she is trying to eat better, etc . Cardiovascular risk reduction: o Current hypertensive regimen: amlodipine, edarbi (gave samples), hctz - BP was 150/70 (BP was 146/70 at past visit)-counseled patient to practice low salt diet, avoid soft drinks (caffeine).  Patient has been unable to check.  Encouraged her to come to office to have checked. o Current hyperlipidemia regimen: rosuvastatin '20mg'$   daily  Pharmacist Clinical Goal(s):  Marland Kitchen Over the next 90 days, patient with work with PharmD and primary care provider to address optimized medication management of chronic conditions  Interventions: . Comprehensive medication review performed, medication list updated in electronic medical record . Comprehensive medication review performed.  Reviewed medication fill history via insurance claims data confirming patient appears compliant with having her medications filled on time as prescribed by provider. . Reviewed & discussed the following diabetes-related information with patient: o Continue checking blood sugars as directed o Follow ADA recommended "diabetes-friendly" diet  (reviewed healthy snack/food options) o Reviewed medication purpose/side effects--> patient complains about GI symptoms from [place] and she has self-discontinued it  CCM RN CM Interventions 07/19/19 call completed with patient  . Reviewed with patient her most recent A1c 8.4% on 04/2019 increased to 8.2% on 01/29/19; Reviewed target A1c <7.0% . Current antihyperglycemic regimen: Metformin, Toujeo 34 units qHS  o Previously on Ozempic '1mg'$  weekly (NOT taking Ozempic due to side effects) o Pt reports adherence with taking medications as prescribed . denies hypoglycemic symptoms;denies hyperglycemic symptoms . Current exercise: walking and working with PT including following a HEP . Current blood glucose readings: FBG was 140 this AM, but can be as high as 180; pt states she is trying to eat better, etc . Mailed printed patient educational materials, patient will review and discuss at next RN CM follow up; Your Diabetes Care Schedule; Grocery Shopping; Diabetes Meal Planning; Diabetes and Kidneys  . Encouraged patient to continue to monitor CBG's twice daily before meals and record; Reviewed target daily glycemic control FBS 80-130 and <180 after meals . Sent new Pharmacy referral to review current medication regimen for  patient and make recommendations for change in care if appropriate, pt is appreciative  Patient Self Care Activities:  . Patient will check blood glucose daily, document, and provide at future appointments . Patient will focus on medication adherence by continuing to take medications as prescribed. . Patient will take medications as prescribed . Patient will contact provider with any episodes of hypoglycemia . Patient will report any questions or concerns to provider   Please see past updates related to this goal by clicking on the "Past Updates" button in the selected goal         Plan:   Telephone follow up appointment with care management team member scheduled for: 10/10/19  Barb Merino, RN, BSN, CCM Care Management Coordinator [place] [facility]  Direct Phone: [phone]

## 2019-08-24 NOTE — Patient Instructions (Signed)
Visit Information  Goals Addressed      Patient Stated   . "I would like to better manage my Diabetes" (pt-stated)       Current Barriers:  Marland Kitchen Knowledge Deficits related to diabetes disease process and Self Health Management   Nurse Case Manager Clinical Goal(s):  Marland Kitchen Over the next 90 days, patient will work with the CCM team to address needs related to education and support to improve Self management of DM . New - 02/12/19 - Over the next 90 days, patient will lower her A1C <8.2 Goal Not Met . New - 07/19/19 Over the next 90 days, patient will work with embedded CCM team on disease education and support to improve Self Health management of DM to help lower A1c <7.0 %  CCM RN CM Interventions:  08/15/19 call completed with patient  . Reviewed with patient her most recent A1c has increased from 8.4% to 9.6 % . Reinforced importance of lowering A1c with target A1c < 7.0 % and daily glycemic target for FBS 80-130 and or <180 after meals . Reviewed and discussed current antihyperglycemic regimen: Metformin, Toujeo 34 units qHS  o Previously on Ozempic 35m weekly (NOT taking Ozempic due to side effects) o Pt reports adherence with taking medications as prescribed . denies hypoglycemic symptoms;denies hyperglycemic symptoms . Current exercise: going to gym 2-3 times weekly and working with PT includes following a HEP . Current blood glucose readings: FBG 140-180; pt states she is trying to eat better, etc . Encouraged patient to continue to monitor CBG's twice daily before meals and record and to bring in recorded readings to next PCP f/u . Discussed next PCP OV is scheduled for 09/11/19 with Dr. SBaird Cancer next embedded Pharm D telephone visit is scheduled for 09/12/19 . Sent in basket message to embedded Pharm D to alert of increased A1c and next scheduled OV with PCP . Discussed plans with patient for ongoing care management follow up and provided patient with direct contact information for care  management team  Patient Self Care Activities:  . Self administers medications as prescribed . Attends all scheduled provider appointments . Calls pharmacy for medication refills . Attends church or other social activities . Performs ADL's independently . Performs IADL's independently . Calls provider office for new concerns or questions  Please see past updates related to this goal by clicking on the "Past Updates" button in the selected goal      . COMPLETED: "I would like to work on lowering my Triglycerides" (pt-stated)       Current Barriers:  .Marland KitchenKnowledge Deficits related to Self health management of Hypertriglyceridemia  Nurse Case Manager Clinical Goal(s):  .Marland KitchenOver the next 60 days, patient will work with the CCM team and PCP to address needs related to disease management of Hyperlipidemia/ Hypertriglyceridemia Goal Met . Over the next 90 days, patient will lower her Triglycerides to target range; <149 Goal Met  CCM RN CM Interventions:  08/15/19 call completed with patient  . Evaluation of current treatment plan related to treatment management of Hypertriglyceridemia and patient's adherence to plan as established by provider . Discussed and reviewed most current lipid panel; Determined each lipid result is WNL and patient continues to report adherence with taking Rosuvastatin as prescribed, she is eating a low fat diet and going to the gym 2-3 times per week  . Discussed plans with patient for ongoing care management follow up and provided patient with direct contact information for care management team  Patient Self Care Activities:  . Self administers medications as prescribed . Attends all scheduled provider appointments . Calls pharmacy for medication refills . Performs ADL's independently . Performs IADL's independently . Calls provider office for new concerns or questions  Please see past updates related to this goal by clicking on the "Past Updates" button in the  selected goal      . COMPLETED: I would like to continue to optimize my medication managment of my chronic conditions (pt-stated)       Current Barriers:  . Diabetes: T2DM; most recent A1c 8.4% on 04/2019 increased to 8.2% on 01/29/19 . Current antihyperglycemic regimen: Metformin, Toujeo 32 units qHS  o Previously on Ozempic 19m weekly (NOT taking Ozempic due to side effects) o Increase Toujeo to 34 units qHS . denies hypoglycemic symptoms;denies hyperglycemic symptoms . Current exercise: walking . Current blood glucose readings: FBG was 139 this AM, but can be as high as 180; pt states she is trying to eat better, etc . Cardiovascular risk reduction: o Current hypertensive regimen: amlodipine, edarbi (gave samples), hctz - BP was 150/70 (BP was 146/70 at past visit)-counseled patient to practice low salt diet, avoid soft drinks (caffeine).  Patient has been unable to check.  Encouraged her to come to office to have checked. o Current hyperlipidemia regimen: rosuvastatin 242mdaily  Pharmacist Clinical Goal(s):  . Marland Kitchenver the next 90 days, patient with work with PharmD and primary care provider to address optimized medication management of chronic conditions  Interventions: . Comprehensive medication review performed, medication list updated in electronic medical record . Comprehensive medication review performed.  Reviewed medication fill history via insurance claims data confirming patient appears compliant with having her medications filled on time as prescribed by provider. . Reviewed & discussed the following diabetes-related information with patient: o Continue checking blood sugars as directed o Follow ADA recommended "diabetes-friendly" diet  (reviewed healthy snack/food options) o Reviewed medication purpose/side effects--> patient complains about GI symptoms from [place] she has self-discontinued it  CCM RN CM Interventions 07/19/19 call completed with patient  . Reviewed with  patient her most recent A1c 8.4% on 04/2019 increased to 8.2% on 01/29/19; Reviewed target A1c <7.0% . Current antihyperglycemic regimen: Metformin, Toujeo 34 units qHS  o Previously on Ozempic 61m67meekly (NOT taking Ozempic due to side effects) o Pt reports adherence with taking medications as prescribed . denies hypoglycemic symptoms;denies hyperglycemic symptoms . Current exercise: walking and working with PT including following a HEP . Current blood glucose readings: FBG was 140 this AM, but can be as high as 180; pt states she is trying to eat better, etc . Mailed printed patient educational materials, patient will review and discuss at next RN CM follow up; Your Diabetes Care Schedule; Grocery Shopping; Diabetes Meal Planning; Diabetes and Kidneys  . Encouraged patient to continue to monitor CBG's twice daily before meals and record; Reviewed target daily glycemic control FBS 80-130 and <180 after meals . Sent new Pharmacy referral to review current medication regimen for patient and make recommendations for change in care if appropriate, pt is appreciative  Patient Self Care Activities:  . Patient will check blood glucose daily, document, and provide at future appointments . Patient will focus on medication adherence by continuing to take medications as prescribed. . Patient will take medications as prescribed . Patient will contact provider with any episodes of hypoglycemia . Patient will report any questions or concerns to provider   Please see past updates related to  this goal by clicking on the "Past Updates" button in the selected goal         Patient verbalizes understanding of instructions provided today.   Telephone follow up appointment with care management team member scheduled for: 10/10/19  Barb Merino, RN, BSN, CCM Care Management Coordinator [place] [facility]  Direct Phone: [phone]

## 2019-08-28 ENCOUNTER — Ambulatory Visit: Payer: Self-pay

## 2019-08-28 DIAGNOSIS — I129 Hypertensive chronic kidney disease with stage 1 through stage 4 chronic kidney disease, or unspecified chronic kidney disease: Secondary | ICD-10-CM

## 2019-08-28 NOTE — Chronic Care Management (AMB) (Signed)
Chronic Care Management    Social Work Follow Up Note  08/28/2019 Name: Leslie Davis MRN: [id number] DOB: 06-11-1955  Leslie Davis is a 64 y.o. year old female who is a primary care patient of Leslie Chard, MD. The CCM team was consulted for assistance with care coordination.   Review of patient status, including review of consultants reports, other relevant assessments, and collaboration with appropriate care team members and the patient's provider was performed as part of comprehensive patient evaluation and provision of chronic care management services.    SDOH (Social Determinants of Health) assessments performed: No    Outpatient Encounter Medications as of 08/28/2019  Medication Sig  . amLODipine (NORVASC) 10 MG tablet TAKE 1 TABLET ONCE DAILY.  Marland Kitchen aspirin EC 81 MG tablet Take 81 mg by mouth daily.  . Blood Glucose Monitoring Suppl (ONETOUCH VERIO) w/Device KIT Use as directed to check blood sugars 2 times per day dx: e11.65  . cetirizine (ZYRTEC) 10 MG tablet Take 10 mg by mouth daily. As needed for allergies  . dapagliflozin propanediol (FARXIGA) 10 MG TABS tablet Take 1 tablet (10 mg total) by mouth daily before breakfast.  . EDARBI 80 MG TABS TAKE 1 TABLET EACH DAY.  . ferrous gluconate (FERGON) 324 MG tablet TAKE 1 TABLET BY MOUTH TWICE DAILY WITH A MEAL.  Marland Kitchen glucose blood (ONETOUCH VERIO) test strip Use as instructed to check blood sugars 2 times per day dx:e11.65  . hydrochlorothiazide (HYDRODIURIL) 12.5 MG tablet Take 1 tablet (12.5 mg total) by mouth daily.  . insulin glargine, 1 Unit Dial, (TOUJEO SOLOSTAR) 300 UNIT/ML Solostar Pen Inject 35 Units into the skin at bedtime. (Patient taking differently: Inject 38 Units into the skin at bedtime. )  . Insulin Pen Needle 32G X 6 MM MISC Use with pen injectors daily dx code: e11.9  . OneTouch Delica Lancets 21H MISC Use as directed to check blood sugars 2 times per day dx:e11.65  . pantoprazole (PROTONIX) 40 MG tablet  Take 1 tablet (40 mg total) by mouth daily.  . rosuvastatin (CRESTOR) 20 MG tablet Take 1 tablet (20 mg total) by mouth daily.  . temazepam (RESTORIL) 30 MG capsule One capsules po qhs prn   No facility-administered encounter medications on file as of 08/28/2019.     Goals Addressed            This Visit's Progress     Patient Stated   . COMPLETED: "I would like to order a blood pressure cuff through my insurance benefit" (pt-stated)       CARE PLAN ENTRY (see longitudinal plan of care for additional care plan information)  Current Barriers:  . Limited knowledge of how to access health plan OTC (Over the counter) benefit . Chronic diagnosis of hypertensive nephropathy  Social Work Clinical Goal(s):  Marland Kitchen Over the next 10 days the patient will work with SW to contact her health plan and order a new blood pressure monitor . Over the next 30 days the patient will be more knowledgeable of how to access health plan OTC Catalog as evidenced by a greater understanding of how to order supplies in future quarters  CCM SW Interventions: Completed 08/28/19 . Successful outbound call placed to the patient to assist with ordering equipment through OTC benefit . Informed by the patient she already has a BP monitor . Encouraged the patient to contact SW with future resource needs . Collaboration with RN Care Manager regarding goal completion . No SW follow up  planned  Patient Self Care Activities:  . Patient verbalizes understanding of plan to work with SW to obtain a blood pressure monitor . Self administers medications as prescribed . Attends all scheduled provider appointments . Performs ADL's independently . Calls provider office for new concerns or questions  Please see past updates related to this goal by clicking on the "Past Updates" button in the selected goal          Follow Up Plan: No SW follow up planned at this time. The patient will remain active with RN Care Manager and  PharmD.   Daneen Schick, BSW, CDP Social Worker, Certified Dementia Practitioner [place] [facility] [place] [facility] [phone]  Total time spent performing care coordination and/or care management activities with the patient by phone or face to face = 15 minutes.

## 2019-08-28 NOTE — Patient Instructions (Signed)
Social Worker Visit Information  Goals we discussed today:  Goals Addressed            This Visit's Progress     Patient Stated   . COMPLETED: "I would like to order a blood pressure cuff through my insurance benefit" (pt-stated)       CARE PLAN ENTRY (see longitudinal plan of care for additional care plan information)  Current Barriers:  . Limited knowledge of how to access health plan OTC (Over the counter) benefit . Chronic diagnosis of hypertensive nephropathy  Social Work Clinical Goal(s):  Marland Kitchen Over the next 10 days the patient will work with SW to contact her health plan and order a new blood pressure monitor . Over the next 30 days the patient will be more knowledgeable of how to access health plan OTC Catalog as evidenced by a greater understanding of how to order supplies in future quarters  CCM SW Interventions: Completed 08/28/19 . Successful outbound call placed to the patient to assist with ordering equipment through OTC benefit . Informed by the patient she already has a BP monitor . Encouraged the patient to contact SW with future resource needs . Collaboration with RN Care Manager regarding goal completion . No SW follow up planned  Patient Self Care Activities:  . Patient verbalizes understanding of plan to work with SW to obtain a blood pressure monitor . Self administers medications as prescribed . Attends all scheduled provider appointments . Performs ADL's independently . Calls provider office for new concerns or questions  Please see past updates related to this goal by clicking on the "Past Updates" button in the selected goal        Follow Up Plan: No SW follow up planned at this time. Please contact me as needed.  Daneen Schick, BSW, CDP Social Worker, Certified Dementia Practitioner [facility] [phone]

## 2019-08-31 ENCOUNTER — Ambulatory Visit (INDEPENDENT_AMBULATORY_CARE_PROVIDER_SITE_OTHER): Payer: Medicare Other

## 2019-08-31 DIAGNOSIS — Z23 Encounter for immunization: Secondary | ICD-10-CM

## 2019-08-31 NOTE — Progress Notes (Signed)
   Covid-19 Vaccination Clinic  Name:  Leslie Davis    MRN: [id number] DOB: 1955-09-21  08/31/2019  Ms. Tanabe was observed post Covid-19 immunization for 15 minutes without incident. She was provided with Vaccine Information Sheet and instruction to access the V-Safe system.   Ms. Jaskolski was instructed to call 911 with any severe reactions post vaccine: Marland Kitchen Difficulty breathing  . Swelling of face and throat  . A fast heartbeat  . A bad rash all over body  . Dizziness and weakness   Immunizations Administered    Name Date Dose VIS Date Route   Moderna COVID-19 Vaccine 08/31/2019  8:00 AM 0.5 mL 02/2019 Intramuscular   Manufacturer: Levan Hurst   Lot: [id number]   Corder: [id number]

## 2019-09-05 ENCOUNTER — Other Ambulatory Visit: Payer: Self-pay

## 2019-09-05 MED ORDER — ONETOUCH VERIO VI STRP: ORAL_STRIP | 3 refills | Status: DC

## 2019-09-06 ENCOUNTER — Emergency Department (HOSPITAL_COMMUNITY): Payer: Medicare Other

## 2019-09-06 ENCOUNTER — Encounter (HOSPITAL_COMMUNITY): Payer: Self-pay | Admitting: Emergency Medicine

## 2019-09-06 ENCOUNTER — Other Ambulatory Visit: Payer: Self-pay

## 2019-09-06 ENCOUNTER — Inpatient Hospital Stay (HOSPITAL_COMMUNITY)
Admission: EM | Admit: 2019-09-06 | Discharge: 2019-09-08 | DRG: 684 | Disposition: A | Discharge status: Home / Self Care | Payer: Medicare Other | Attending: Internal Medicine | Admitting: Internal Medicine

## 2019-09-06 DIAGNOSIS — E1165 Type 2 diabetes mellitus with hyperglycemia: Secondary | ICD-10-CM | POA: Diagnosis not present

## 2019-09-06 DIAGNOSIS — Z79899 Other long term (current) drug therapy: Secondary | ICD-10-CM | POA: Diagnosis not present

## 2019-09-06 DIAGNOSIS — Z8249 Family history of ischemic heart disease and other diseases of the circulatory system: Secondary | ICD-10-CM

## 2019-09-06 DIAGNOSIS — E860 Dehydration: Secondary | ICD-10-CM | POA: Diagnosis not present

## 2019-09-06 DIAGNOSIS — K219 Gastro-esophageal reflux disease without esophagitis: Secondary | ICD-10-CM | POA: Diagnosis not present

## 2019-09-06 DIAGNOSIS — E1122 Type 2 diabetes mellitus with diabetic chronic kidney disease: Secondary | ICD-10-CM | POA: Diagnosis not present

## 2019-09-06 DIAGNOSIS — D649 Anemia, unspecified: Secondary | ICD-10-CM

## 2019-09-06 DIAGNOSIS — E1169 Type 2 diabetes mellitus with other specified complication: Secondary | ICD-10-CM | POA: Diagnosis present

## 2019-09-06 DIAGNOSIS — Z800 Family history of malignant neoplasm of digestive organs: Secondary | ICD-10-CM

## 2019-09-06 DIAGNOSIS — N182 Chronic kidney disease, stage 2 (mild): Secondary | ICD-10-CM | POA: Diagnosis present

## 2019-09-06 DIAGNOSIS — E1143 Type 2 diabetes mellitus with diabetic autonomic (poly)neuropathy: Secondary | ICD-10-CM | POA: Diagnosis not present

## 2019-09-06 DIAGNOSIS — Z794 Long term (current) use of insulin: Secondary | ICD-10-CM

## 2019-09-06 DIAGNOSIS — I129 Hypertensive chronic kidney disease with stage 1 through stage 4 chronic kidney disease, or unspecified chronic kidney disease: Secondary | ICD-10-CM | POA: Diagnosis not present

## 2019-09-06 DIAGNOSIS — M62838 Other muscle spasm: Secondary | ICD-10-CM | POA: Diagnosis present

## 2019-09-06 DIAGNOSIS — R739 Hyperglycemia, unspecified: Secondary | ICD-10-CM | POA: Diagnosis not present

## 2019-09-06 DIAGNOSIS — I10 Essential (primary) hypertension: Secondary | ICD-10-CM | POA: Diagnosis not present

## 2019-09-06 DIAGNOSIS — N179 Acute kidney failure, unspecified: Principal | ICD-10-CM | POA: Diagnosis present

## 2019-09-06 DIAGNOSIS — R001 Bradycardia, unspecified: Secondary | ICD-10-CM | POA: Diagnosis not present

## 2019-09-06 DIAGNOSIS — Z87891 Personal history of nicotine dependence: Secondary | ICD-10-CM

## 2019-09-06 DIAGNOSIS — E785 Hyperlipidemia, unspecified: Secondary | ICD-10-CM | POA: Diagnosis present

## 2019-09-06 DIAGNOSIS — K760 Fatty (change of) liver, not elsewhere classified: Secondary | ICD-10-CM | POA: Diagnosis present

## 2019-09-06 DIAGNOSIS — D631 Anemia in chronic kidney disease: Secondary | ICD-10-CM | POA: Diagnosis present

## 2019-09-06 DIAGNOSIS — R0602 Shortness of breath: Secondary | ICD-10-CM | POA: Diagnosis not present

## 2019-09-06 DIAGNOSIS — Z7982 Long term (current) use of aspirin: Secondary | ICD-10-CM | POA: Diagnosis not present

## 2019-09-06 DIAGNOSIS — Z833 Family history of diabetes mellitus: Secondary | ICD-10-CM | POA: Diagnosis not present

## 2019-09-06 DIAGNOSIS — Z20822 Contact with and (suspected) exposure to covid-19: Secondary | ICD-10-CM | POA: Diagnosis not present

## 2019-09-06 DIAGNOSIS — Z87442 Personal history of urinary calculi: Secondary | ICD-10-CM | POA: Diagnosis not present

## 2019-09-06 LAB — TROPONIN I (HIGH SENSITIVITY)
Troponin I (High Sensitivity): 5 ng/L (ref <18)
Troponin I (High Sensitivity): 6 ng/L (ref <18)

## 2019-09-06 LAB — HEPATIC FUNCTION PANEL
ALT: 38 U/L (ref 0–44)
AST: 29 U/L (ref 15–41)
Albumin: 4.2 g/dL (ref 3.5–5.0)
Alkaline Phosphatase: 125 U/L (ref 38–126)
Bilirubin, Direct: 0.1 mg/dL (ref 0.0–0.2)
Indirect Bilirubin: 0.4 mg/dL (ref 0.3–0.9)
Total Bilirubin: 0.5 mg/dL (ref 0.3–1.2)
Total Protein: 7.8 g/dL (ref 6.5–8.1)

## 2019-09-06 LAB — BASIC METABOLIC PANEL
Anion gap: 10 (ref 5–15)
BUN: 30 mg/dL — ABNORMAL HIGH (ref 8–23)
CO2: 22 mmol/L (ref 22–32)
Calcium: 9.4 mg/dL (ref 8.9–10.3)
Chloride: 102 mmol/L (ref 98–111)
Creatinine, Ser: 1.78 mg/dL — ABNORMAL HIGH (ref 0.44–1.00)
GFR calc Af Amer: 34 mL/min — ABNORMAL LOW (ref >60)
GFR calc non Af Amer: 29 mL/min — ABNORMAL LOW (ref >60)
Glucose, Bld: 307 mg/dL — ABNORMAL HIGH (ref 70–99)
Potassium: 4 mmol/L (ref 3.5–5.1)
Sodium: 134 mmol/L — ABNORMAL LOW (ref 135–145)

## 2019-09-06 LAB — URINALYSIS, ROUTINE W REFLEX MICROSCOPIC
Bacteria, UA: NONE SEEN
Bilirubin Urine: NEGATIVE
Glucose, UA: >=500 mg/dL — AB
Hgb urine dipstick: NEGATIVE
Ketones, ur: NEGATIVE mg/dL
Leukocytes,Ua: NEGATIVE
Nitrite: NEGATIVE
Protein, ur: NEGATIVE mg/dL
Specific Gravity, Urine: 1.018 (ref 1.005–1.030)
pH: 5 (ref 5.0–8.0)

## 2019-09-06 LAB — CBG MONITORING, ED: Glucose-Capillary: 317 mg/dL — ABNORMAL HIGH (ref 70–99)

## 2019-09-06 LAB — CBC
HCT: 35.6 % — ABNORMAL LOW (ref 36.0–46.0)
Hemoglobin: 11.4 g/dL — ABNORMAL LOW (ref 12.0–15.0)
MCH: 28 pg (ref 26.0–34.0)
MCHC: 32 g/dL (ref 30.0–36.0)
MCV: 87.5 fL (ref 80.0–100.0)
Platelets: 278 10*3/uL (ref 150–400)
RBC: 4.07 MIL/uL (ref 3.87–5.11)
RDW: 13.7 % (ref 11.5–15.5)
WBC: 6.1 10*3/uL (ref 4.0–10.5)
nRBC: 0 % (ref 0.0–0.2)

## 2019-09-06 MED ORDER — INSULIN ASPART 100 UNIT/ML ~~LOC~~ SOLN
0.0000 [IU] | Freq: Three times a day (TID) | SUBCUTANEOUS | Status: DC
  Administered 2019-09-07 (×2): 3 [IU] via SUBCUTANEOUS
  Administered 2019-09-07: 2 [IU] via SUBCUTANEOUS
  Administered 2019-09-08 (×2): 3 [IU] via SUBCUTANEOUS
  Filled 2019-09-06: qty 0.15

## 2019-09-06 MED ORDER — SODIUM CHLORIDE 0.9 % IV BOLUS
1000.0000 mL | Freq: Once | INTRAVENOUS | Status: AC
  Administered 2019-09-06: 1000 mL via INTRAVENOUS

## 2019-09-06 MED ORDER — ENOXAPARIN SODIUM 40 MG/0.4ML ~~LOC~~ SOLN
40.0000 mg | Freq: Every day | SUBCUTANEOUS | Status: DC
  Administered 2019-09-07 (×2): 40 mg via SUBCUTANEOUS
  Filled 2019-09-06 (×2): qty 0.4

## 2019-09-06 MED ORDER — INSULIN ASPART 100 UNIT/ML ~~LOC~~ SOLN
0.0000 [IU] | Freq: Every day | SUBCUTANEOUS | Status: DC
  Filled 2019-09-06: qty 0.05

## 2019-09-06 MED ORDER — INSULIN DETEMIR 100 UNIT/ML ~~LOC~~ SOLN
30.0000 [IU] | Freq: Every day | SUBCUTANEOUS | Status: DC
  Administered 2019-09-07: 30 [IU] via SUBCUTANEOUS
  Filled 2019-09-06: qty 0.3

## 2019-09-06 NOTE — ED Triage Notes (Signed)
Patient is complaining of feeling tired and her blood sugar has been up for about 3 weeks.

## 2019-09-06 NOTE — ED Provider Notes (Signed)
[facility] DEPT Provider Note   CSN: [id number] Arrival date & time: 09/06/19  2015   History Chief Complaint  Patient presents with  . Hyperglycemia  . Fatigue    Leslie Davis is a 64 y.o. female.  The history is provided by the patient.  Hyperglycemia She has a history of hypertension, diabetes, hyperlipidemia and comes in complaining of generalized fatigue for the last 2 weeks.  Symptoms are getting worse.  She has also noted some generalized muscle spasms.  There has been some mild dyspnea.  She denies fever, chills, sweats.  She denies cough.  She denies chest pain, heaviness, tightness, pressure.  She denies nausea, vomiting, diarrhea.  She has noted her blood sugars have been running higher than normal with readings generally around 290 were as she normally runs between 100 and 150.  Of note, she did complete her COVID-19 vaccination series last week.  Past Medical History:  Diagnosis Date  . DM (diabetes mellitus) ([place])   . Fatty liver disease, nonalcoholic   . Gastritis   . Gastroparesis   . GERD (gastroesophageal reflux disease)   . Hiatal hernia   . Hiatal hernia   . HLD (hyperlipidemia)   . HTN (hypertension)   . Renal calculus     Patient Active Problem List   Diagnosis Date Noted  . HLD (hyperlipidemia)   . Hypertensive nephropathy 05/16/2019  . Erosive gastritis 11/16/2017  . Fatty liver 11/16/2017  . Abdominal pain 12/11/2012  . Early satiety 12/11/2012  . Hiatal hernia   . Hx of cholecystectomy 02/05/2011  . Flu-like symptoms 02/05/2011  . CONSTIPATION 08/27/2008  . FATTY LIVER DISEASE 05/07/2008  . Nausea with vomiting 04/23/2008  . EPIGASTRIC PAIN 04/23/2008  . RENAL CALCULUS 04/22/2008  . Type 2 diabetes mellitus with stage 2 chronic kidney disease, with long-term current use of insulin ([place]) 05/19/2006  . HYPERLIPIDEMIA 05/19/2006  . HYPERTENSION, BENIGN SYSTEMIC 05/19/2006  . GASTROESOPHAGEAL REFLUX, NO  ESOPHAGITIS 05/19/2006  . AMENORRHEA 05/19/2006    Past Surgical History:  Procedure Laterality Date  . CHOLECYSTECTOMY    . COLONOSCOPY    . TUBAL LIGATION    . UPPER GASTROINTESTINAL ENDOSCOPY       OB History   No obstetric history on file.     Family History  Problem Relation Age of Onset  . Diabetes Mother   . Diabetes Maternal Grandmother   . Stomach cancer Maternal Aunt        X 2 aunts  . Hypertension Father   . Colon cancer Neg Hx   . Esophageal cancer Neg Hx   . Rectal cancer Neg Hx     Social History   Tobacco Use  . Smoking status: Former Smoker    Packs/day: 0.25    Years: 1.00    Pack years: 0.25    Quit date: 09/10/1974    Years since quitting: 45.0  . Smokeless tobacco: Never Used  . Tobacco comment: she no longer smokes.   Vaping Use  . Vaping Use: Never used  Substance Use Topics  . Alcohol use: No  . Drug use: No    Home Medications Prior to Admission medications   Medication Sig Start Date End Date Taking? Authorizing Provider  amLODipine (NORVASC) 10 MG tablet TAKE 1 TABLET ONCE DAILY. 03/02/19   [name], MD  aspirin EC 81 MG tablet Take 81 mg by mouth daily.    [provider]  Blood Glucose Monitoring Suppl (ONETOUCH VERIO)  w/Device KIT Use as directed to check blood sugars 2 times per day dx: e11.65 02/13/19   [name], MD  cetirizine (ZYRTEC) 10 MG tablet Take 10 mg by mouth daily. As needed for allergies    [provider]  dapagliflozin propanediol (FARXIGA) 10 MG TABS tablet Take 1 tablet (10 mg total) by mouth daily before breakfast. 08/13/19   [name], MD  EDARBI 80 MG TABS TAKE 1 TABLET EACH DAY. 03/05/19   [name], MD  ferrous gluconate (FERGON) 324 MG tablet TAKE 1 TABLET BY MOUTH TWICE DAILY WITH A MEAL. 05/28/19   Mansouraty, Telford Nab., MD  glucose blood (ONETOUCH VERIO) test strip Use as instructed to check blood sugars 2 times per day dx:e11.65 09/05/19   [name], MD   hydrochlorothiazide (HYDRODIURIL) 12.5 MG tablet Take 1 tablet (12.5 mg total) by mouth daily. 05/16/19   [name], MD  insulin glargine, 1 Unit Dial, (TOUJEO SOLOSTAR) 300 UNIT/ML Solostar Pen Inject 35 Units into the skin at bedtime. Patient taking differently: Inject 38 Units into the skin at bedtime.  06/28/19   [name], MD  Insulin Pen Needle 32G X 6 MM MISC Use with pen injectors daily dx code: e11.9 12/26/18   [name], MD  OneTouch Delica Lancets 60V MISC Use as directed to check blood sugars 2 times per day dx:e11.65 02/13/19   [name], MD  pantoprazole (PROTONIX) 40 MG tablet Take 1 tablet (40 mg total) by mouth daily. 08/13/19   Mansouraty, Telford Nab., MD  rosuvastatin (CRESTOR) 20 MG tablet Take 1 tablet (20 mg total) by mouth daily. 05/14/19   [name], MD  temazepam (RESTORIL) 30 MG capsule One capsules po qhs prn 05/16/19   [name], MD    Allergies    Patient has no known allergies.  Review of Systems   Review of Systems  All other systems reviewed and are negative.   Physical Exam Updated Vital Signs BP (!) 183/79 (BP Location: Left Arm)   Pulse (!) 55   Temp 98 F (36.7 C) (Oral)   Resp 14   Ht _0  (1.626 m)   Wt 94.3 kg   SpO2 94%   BMI 35.70 kg/m   Physical Exam Vitals and nursing note reviewed.   64 year old female, resting comfortably and in no acute distress. Vital signs are significant for slow heart rate and elevated blood pressure. Oxygen saturation is 94%, which is normal. Head is normocephalic and atraumatic. PERRLA, EOMI. Oropharynx is clear. Neck is nontender and supple without adenopathy or JVD. Back is nontender and there is no CVA tenderness. Lungs are clear without rales, wheezes, or rhonchi. Chest is nontender. Heart has regular rate and rhythm without murmur. Abdomen is soft, flat, nontender without masses or hepatosplenomegaly and peristalsis is normoactive. Extremities have no cyanosis or edema, full  range of motion is present. Skin is warm and dry without rash. Neurologic: Mental status is normal, cranial nerves are intact, there are no motor or sensory deficits.  ED Results / Procedures / Treatments   Labs (all labs ordered are listed, but only abnormal results are displayed) Labs Reviewed  BASIC METABOLIC PANEL - Abnormal; Notable for the following components:      Result Value   Sodium 134 (*)    Glucose, Bld 307 (*)    BUN 30 (*)    Creatinine, Ser 1.78 (*)    GFR calc non Af Amer 29 (*)    GFR calc Af  Amer 34 (*)    All other components within normal limits  CBC - Abnormal; Notable for the following components:   Hemoglobin 11.4 (*)    HCT 35.6 (*)    All other components within normal limits  URINALYSIS, ROUTINE W REFLEX MICROSCOPIC - Abnormal; Notable for the following components:   Color, Urine STRAW (*)    Glucose, UA >=500 (*)    All other components within normal limits  BASIC METABOLIC PANEL - Abnormal; Notable for the following components:   Glucose, Bld 285 (*)    BUN 29 (*)    Creatinine, Ser 1.43 (*)    GFR calc non Af Amer 38 (*)    GFR calc Af Amer 44 (*)    All other components within normal limits  CBC - Abnormal; Notable for the following components:   Hemoglobin 11.0 (*)    HCT 34.1 (*)    All other components within normal limits  GLUCOSE, CAPILLARY - Abnormal; Notable for the following components:   Glucose-Capillary 215 (*)    All other components within normal limits  CBG MONITORING, ED - Abnormal; Notable for the following components:   Glucose-Capillary 317 (*)    All other components within normal limits  CBG MONITORING, ED - Abnormal; Notable for the following components:   Glucose-Capillary 200 (*)    All other components within normal limits  SARS CORONAVIRUS 2 BY RT PCR (HOSPITAL ORDER, PERFORMED IN [facility])  HEPATIC FUNCTION PANEL  OSMOLALITY, URINE  SODIUM, URINE, RANDOM  HIV ANTIBODY (ROUTINE TESTING W REFLEX)   CREATININE, URINE, 24 HOUR  TROPONIN I (HIGH SENSITIVITY)  TROPONIN I (HIGH SENSITIVITY)    EKG EKG Interpretation  Date/Time:  Thursday September 06 2019 20:46:07 EDT Ventricular Rate:  54 PR Interval:    QRS Duration: 94 QT Interval:  429 QTC Calculation: 407 R Axis:   15 Text Interpretation: Sinus rhythm Nonspecific T abnormalities, lateral leads When compared with ECG of 10/11/2018, Nonspecific T wave abnormality is now present Confirmed by Dione Booze ([id number]) on 09/06/2019 11:09:18 PM   Radiology DG Chest Port 1 View  Result Date: 09/06/2019 CLINICAL DATA:  Shortness of breath EXAM: PORTABLE CHEST 1 VIEW COMPARISON:  10/11/2018 FINDINGS: Borderline cardiac size. No edema. Aortic atherosclerosis. Both lungs are clear. The visualized skeletal structures are unremarkable. IMPRESSION: No active disease. Electronically Signed   By: Jasmine Pang M.D.   On: 09/06/2019 23:25    Procedures Procedures   Medications Ordered in ED Medications  enoxaparin (LOVENOX) injection 40 mg (40 mg Subcutaneous Given 09/07/19 0013)  insulin detemir (LEVEMIR) injection 30 Units (30 Units Subcutaneous Given 09/07/19 0013)  insulin aspart (novoLOG) injection 0-15 Units (2 Units Subcutaneous Given 09/07/19 0759)  insulin aspart (novoLOG) injection 0-5 Units (0 Units Subcutaneous Not Given 09/07/19 0133)  aspirin EC tablet 81 mg (has no administration in time range)  amLODipine (NORVASC) tablet 10 mg (10 mg Oral Given 09/07/19 0842)  rosuvastatin (CRESTOR) tablet 20 mg (has no administration in time range)  temazepam (RESTORIL) capsule 30 mg (has no administration in time range)  pantoprazole (PROTONIX) EC tablet 40 mg (has no administration in time range)  ferrous gluconate (FERGON) tablet 324 mg (324 mg Oral Given 09/07/19 0759)  loratadine (CLARITIN) tablet 10 mg (has no administration in time range)  acetaminophen (TYLENOL) tablet 650 mg (650 mg Oral Given 09/07/19 0555)  sodium chloride 0.9 % bolus 1,000  mL (0 mLs Intravenous Stopped 09/07/19 0120)    ED Course  I have reviewed the triage vital signs and the nursing notes.  Pertinent labs & imaging results that were available during my care of the patient were reviewed by me and considered in my medical decision making (see chart for details).  MDM Rules/Calculators/A&P Fatigue and malaise of uncertain cause.  Labs show elevated creatinine with significant change compared with 5/24.  Mild hyponatremia present no clinical significance.  Mild anemia is present and is unchanged from baseline.  Glucose is moderately elevated to 307 without evidence of ketoacidosis.  Urinalysis is normal.  ECG shows nonspecific T wave changes which are new compared with 2020.  Will need to check hepatic function panel and chest x-ray.  She will need to be admitted for her acute kidney injury.  She will be given IV hydration.  Case is discussed with Dr. Flossie Buffy of [facility], who agrees to admit the patient.  Hepatic function panel is normal.  Final Clinical Impression(s) / ED Diagnoses Final diagnoses:  Acute kidney injury (nontraumatic) (HCC)  Normochromic normocytic anemia  Hyperglycemia    Rx / DC Orders ED Discharge Orders    None       Delora Fuel, MD 46/19/01 [phone]

## 2019-09-06 NOTE — H&P (Signed)
History and Physical    Leslie Davis GYJ:[id number] DOB: 04-Feb-1956 DOA: 09/06/2019  PCP: [name], MD  Patient coming from: Home  I have personally briefly reviewed patient's old medical records in [place]  Chief Complaint: Weakness, fatigue  HPI: Leslie Davis is a 64 y.o. female with medical history significant for hypertension, insulin-dependent type 2 diabetes, CKD stage II, nonalcoholic liver disease, and hyperlipidemia who presents with concerns of weakness and fatigue.  For the past 2 weeks, she has noticed weakness and fatigue.  Has generalized body ache.  Also notes shortness of breath with exertion and a dry cough.  Has headache but no vision changes.  Denies any fever.  No chest pain.  No lower extremity edema.  She is also noticed that her blood glucose have been progressively been higher.  She checks about once a day fasting in the morning.  Last week her blood glucose was about 180s and this week was about to 90s.  She recently saw her PCP at the end of May and had her Metformin switched to [place] due to GI intolerance.  Her last hemoglobin A1c about 3 weeks ago was uncontrolled at 9.6.  States she drinks sodas with zero calories and eats fruit daily. She also notes some nausea but no diarrhea.  Feels somewhat constipated.  No abdominal pain.   ED Course: She was afebrile but noted to be bradycardic in the 50s with mildly elevated blood glucose with systolic of 263.  No leukocytosis.  Sodium 134, BG of 307 with no anion gap, creatinine elevated to 1.78 from a prior of 1.07.  Review of Systems:  Constitutional: No Weight Change, No Fever ENT/Mouth: No sore throat, No Rhinorrhea Eyes: No Eye Pain, No Vision Changes Cardiovascular: No Chest Pain, +SOB, No PND, + Dyspnea on Exertion, No Edema, No Palpitations Respiratory: + Cough, No Sputum, No Wheezing, + Dyspnea  Gastrointestinal: No Nausea, No Vomiting, No Diarrhea, + Constipation, No  Pain Genitourinary: no Urinary Incontinence, Musculoskeletal: No Arthralgias, No Myalgias Skin: No Skin Lesions, No Pruritus, Neuro: no Weakness, No Numbness Psych: No Anxiety/Panic, No Depression, no decrease appetite Heme/Lymph: No Bruising, No Bleeding Past Medical History:  Diagnosis Date  . DM (diabetes mellitus) ([place])   . Fatty liver disease, nonalcoholic   . Gastritis   . Gastroparesis   . GERD (gastroesophageal reflux disease)   . Hiatal hernia   . Hiatal hernia   . HLD (hyperlipidemia)   . HTN (hypertension)   . Renal calculus     Past Surgical History:  Procedure Laterality Date  . CHOLECYSTECTOMY    . COLONOSCOPY    . TUBAL LIGATION    . UPPER GASTROINTESTINAL ENDOSCOPY       reports that she quit smoking about 45 years ago. She has a 0.25 pack-year smoking history. She has never used smokeless tobacco. She reports that she does not drink alcohol and does not use drugs.  No Known Allergies  Family History  Problem Relation Age of Onset  . Diabetes Mother   . Diabetes Maternal Grandmother   . Stomach cancer Maternal Aunt        X 2 aunts  . Hypertension Father   . Colon cancer Neg Hx   . Esophageal cancer Neg Hx   . Rectal cancer Neg Hx      Prior to Admission medications   Medication Sig Start Date End Date Taking? Authorizing Provider  amLODipine (NORVASC) 10 MG tablet TAKE 1 TABLET ONCE DAILY.  03/02/19   [name], MD  aspirin EC 81 MG tablet Take 81 mg by mouth daily.    [provider]  Blood Glucose Monitoring Suppl (ONETOUCH VERIO) w/Device KIT Use as directed to check blood sugars 2 times per day dx: e11.65 02/13/19   [name], MD  cetirizine (ZYRTEC) 10 MG tablet Take 10 mg by mouth daily. As needed for allergies    [provider]  dapagliflozin propanediol (FARXIGA) 10 MG TABS tablet Take 1 tablet (10 mg total) by mouth daily before breakfast. 08/13/19   [name], MD  EDARBI 80 MG TABS TAKE 1 TABLET EACH DAY.  03/05/19   [name], MD  ferrous gluconate (FERGON) 324 MG tablet TAKE 1 TABLET BY MOUTH TWICE DAILY WITH A MEAL. 05/28/19   Mansouraty, Telford Nab., MD  glucose blood (ONETOUCH VERIO) test strip Use as instructed to check blood sugars 2 times per day dx:e11.65 09/05/19   [name], MD  hydrochlorothiazide (HYDRODIURIL) 12.5 MG tablet Take 1 tablet (12.5 mg total) by mouth daily. 05/16/19   [name], MD  insulin glargine, 1 Unit Dial, (TOUJEO SOLOSTAR) 300 UNIT/ML Solostar Pen Inject 35 Units into the skin at bedtime. Patient taking differently: Inject 38 Units into the skin at bedtime.  06/28/19   [name], MD  Insulin Pen Needle 32G X 6 MM MISC Use with pen injectors daily dx code: e11.9 12/26/18   [name], MD  OneTouch Delica Lancets 62H MISC Use as directed to check blood sugars 2 times per day dx:e11.65 02/13/19   [name], MD  pantoprazole (PROTONIX) 40 MG tablet Take 1 tablet (40 mg total) by mouth daily. 08/13/19   Mansouraty, Telford Nab., MD  rosuvastatin (CRESTOR) 20 MG tablet Take 1 tablet (20 mg total) by mouth daily. 05/14/19   [name], MD  temazepam (RESTORIL) 30 MG capsule One capsules po qhs prn 05/16/19   [name], MD    Physical Exam: Vitals:   09/06/19 2035 09/06/19 2258  BP: (!) 183/79 (!) 156/61  Pulse: (!) 55 (!) 56  Resp: 14 18  Temp: 98 F (36.7 C)   TempSrc: Oral   SpO2: 94% 98%  Weight: 94.3 kg   Height: '5\' 4"'$  (1.626 m)     Constitutional: NAD, calm, comfortable, obese female sitting at 40 degree incline in bed Vitals:   09/06/19 2035 09/06/19 2258  BP: (!) 183/79 (!) 156/61  Pulse: (!) 55 (!) 56  Resp: 14 18  Temp: 98 F (36.7 C)   TempSrc: Oral   SpO2: 94% 98%  Weight: 94.3 kg   Height: '5\' 4"'$  (1.626 m)    Eyes: PERRL, lids and conjunctivae normal.  Wears glasses. ENMT: Mucous membranes are moist.  Neck: normal, supple Respiratory: clear to auscultation bilaterally, no wheezing, no crackles. Normal  respiratory effort. No accessory muscle use.  Cardiovascular: Bradycardia, no murmurs / rubs / gallops. No extremity edema. .  Abdomen: no tenderness, no masses palpated. Bowel sounds positive.  Musculoskeletal: no clubbing / cyanosis. No joint deformity upper and lower extremities. Good ROM, no contractures. Normal muscle tone.  Skin: no rashes, lesions, ulcers. No induration Neurologic: CN 2-12 grossly intact. Sensation intact, Strength 5/5 in all 4.  Psychiatric: Normal judgment and insight. Alert and oriented x 3. Normal mood.    Labs on Admission: I have personally reviewed following labs and imaging studies  CBC: Recent Labs  Lab 09/06/19 2057  WBC 6.1  HGB 11.4*  HCT 35.6*  MCV 87.5  PLT 704   Basic Metabolic Panel: Recent Labs  Lab 09/06/19 2057  NA 134*  K 4.0  CL 102  CO2 22  GLUCOSE 307*  BUN 30*  CREATININE 1.78*  CALCIUM 9.4   GFR: Estimated Creatinine Clearance: 35.1 mL/min (A) (by C-G formula based on SCr of 1.78 mg/dL (H)). Liver Function Tests: No results for input(s): AST, ALT, ALKPHOS, BILITOT, PROT, ALBUMIN in the last 168 hours. No results for input(s): LIPASE, AMYLASE in the last 168 hours. No results for input(s): AMMONIA in the last 168 hours. Coagulation Profile: No results for input(s): INR, PROTIME in the last 168 hours. Cardiac Enzymes: No results for input(s): CKTOTAL, CKMB, CKMBINDEX, TROPONINI in the last 168 hours. BNP (last 3 results) No results for input(s): PROBNP in the last 8760 hours. HbA1C: No results for input(s): HGBA1C in the last 72 hours. CBG: Recent Labs  Lab 09/06/19 2051  GLUCAP 317*   Lipid Profile: No results for input(s): CHOL, HDL, LDLCALC, TRIG, CHOLHDL, LDLDIRECT in the last 72 hours. Thyroid Function Tests: No results for input(s): TSH, T4TOTAL, FREET4, T3FREE, THYROIDAB in the last 72 hours. Anemia Panel: No results for input(s): VITAMINB12, FOLATE, FERRITIN, TIBC, IRON, RETICCTPCT in the last 72  hours. Urine analysis:    Component Value Date/Time   COLORURINE STRAW (A) 09/06/2019 2057   APPEARANCEUR CLEAR 09/06/2019 2057   LABSPEC 1.018 09/06/2019 2057   PHURINE 5.0 09/06/2019 2057   GLUCOSEU >=500 (A) 09/06/2019 2057   HGBUR NEGATIVE 09/06/2019 2057   BILIRUBINUR NEGATIVE 09/06/2019 2057   BILIRUBINUR Negative 11/08/2018 1302   KETONESUR NEGATIVE 09/06/2019 2057   PROTEINUR NEGATIVE 09/06/2019 2057   UROBILINOGEN 0.2 11/08/2018 1302   UROBILINOGEN 0.2 02/15/2014 2349   NITRITE NEGATIVE 09/06/2019 2057   LEUKOCYTESUR NEGATIVE 09/06/2019 2057    Radiological Exams on Admission: DG Chest Port 1 View  Result Date: 09/06/2019 CLINICAL DATA:  Shortness of breath EXAM: PORTABLE CHEST 1 VIEW COMPARISON:  10/11/2018 FINDINGS: Borderline cardiac size. No edema. Aortic atherosclerosis. Both lungs are clear. The visualized skeletal structures are unremarkable. IMPRESSION: No active disease. Electronically Signed   By: Donavan Foil M.D.   On: 09/06/2019 23:25      Assessment/Plan Hyperglycemia in the setting of uncontrolled type 2 diabetes Last hemoglobin A1c several weeks ago of 9.6 Normally takes [place] and 43 units of Toujeo We will do 30 units of Levemir nightly, mealtime and nighttime sliding scale  Acute on chronic kidney disease stage II Creatinine elevated to 1.78. Could be multifactorial from prerenal to possibly medication effects of new Farxiga Obtain urine studies Follow after fluids.  Avoid nephrotoxic agent.  Asymptomatic bradycardia Continue to monitor.  However appears that patient has had low to borderline heart rate in the past.  Hypertension Continue amlodipine.  Hold HCTZ and ARB due to AKI  Hyperlipidemia Continue statin  DVT prophylaxis:.Lovenox Code Status: Full Family Communication: Plan discussed with patient at bedside  disposition Plan: Home with observation Consults called:  Admission status: Observation  Status is: Observation  The  patient remains OBS appropriate and will d/c before 2 midnights.  Dispo: The patient is from: Home              Anticipated d/c is to: Home              Anticipated d/c date is: 1 day              Patient currently is not medically stable to d/c.  Orene Desanctis DO [facility]   If 7PM-7AM, please contact night-coverage [email or web address]   09/06/2019, 11:36 PM

## 2019-09-07 ENCOUNTER — Ambulatory Visit: Payer: Self-pay

## 2019-09-07 DIAGNOSIS — Z7982 Long term (current) use of aspirin: Secondary | ICD-10-CM | POA: Diagnosis not present

## 2019-09-07 DIAGNOSIS — E1165 Type 2 diabetes mellitus with hyperglycemia: Secondary | ICD-10-CM | POA: Diagnosis present

## 2019-09-07 DIAGNOSIS — R739 Hyperglycemia, unspecified: Secondary | ICD-10-CM | POA: Diagnosis not present

## 2019-09-07 DIAGNOSIS — Z8249 Family history of ischemic heart disease and other diseases of the circulatory system: Secondary | ICD-10-CM | POA: Diagnosis not present

## 2019-09-07 DIAGNOSIS — R001 Bradycardia, unspecified: Secondary | ICD-10-CM | POA: Diagnosis present

## 2019-09-07 DIAGNOSIS — M62838 Other muscle spasm: Secondary | ICD-10-CM | POA: Diagnosis present

## 2019-09-07 DIAGNOSIS — E1169 Type 2 diabetes mellitus with other specified complication: Secondary | ICD-10-CM | POA: Diagnosis not present

## 2019-09-07 DIAGNOSIS — N182 Chronic kidney disease, stage 2 (mild): Secondary | ICD-10-CM | POA: Diagnosis present

## 2019-09-07 DIAGNOSIS — E860 Dehydration: Secondary | ICD-10-CM | POA: Diagnosis present

## 2019-09-07 DIAGNOSIS — Z800 Family history of malignant neoplasm of digestive organs: Secondary | ICD-10-CM | POA: Diagnosis not present

## 2019-09-07 DIAGNOSIS — E1122 Type 2 diabetes mellitus with diabetic chronic kidney disease: Secondary | ICD-10-CM

## 2019-09-07 DIAGNOSIS — D631 Anemia in chronic kidney disease: Secondary | ICD-10-CM | POA: Diagnosis present

## 2019-09-07 DIAGNOSIS — Z79899 Other long term (current) drug therapy: Secondary | ICD-10-CM | POA: Diagnosis not present

## 2019-09-07 DIAGNOSIS — E785 Hyperlipidemia, unspecified: Secondary | ICD-10-CM | POA: Diagnosis not present

## 2019-09-07 DIAGNOSIS — N179 Acute kidney failure, unspecified: Secondary | ICD-10-CM | POA: Diagnosis present

## 2019-09-07 DIAGNOSIS — I129 Hypertensive chronic kidney disease with stage 1 through stage 4 chronic kidney disease, or unspecified chronic kidney disease: Secondary | ICD-10-CM | POA: Diagnosis present

## 2019-09-07 DIAGNOSIS — E1143 Type 2 diabetes mellitus with diabetic autonomic (poly)neuropathy: Secondary | ICD-10-CM | POA: Diagnosis present

## 2019-09-07 DIAGNOSIS — K760 Fatty (change of) liver, not elsewhere classified: Secondary | ICD-10-CM | POA: Diagnosis present

## 2019-09-07 DIAGNOSIS — Z87891 Personal history of nicotine dependence: Secondary | ICD-10-CM | POA: Diagnosis not present

## 2019-09-07 DIAGNOSIS — Z794 Long term (current) use of insulin: Secondary | ICD-10-CM | POA: Diagnosis not present

## 2019-09-07 DIAGNOSIS — K219 Gastro-esophageal reflux disease without esophagitis: Secondary | ICD-10-CM | POA: Diagnosis present

## 2019-09-07 DIAGNOSIS — Z20822 Contact with and (suspected) exposure to covid-19: Secondary | ICD-10-CM | POA: Diagnosis present

## 2019-09-07 DIAGNOSIS — Z87442 Personal history of urinary calculi: Secondary | ICD-10-CM | POA: Diagnosis not present

## 2019-09-07 DIAGNOSIS — Z833 Family history of diabetes mellitus: Secondary | ICD-10-CM | POA: Diagnosis not present

## 2019-09-07 LAB — CBC
HCT: 34.1 % — ABNORMAL LOW (ref 36.0–46.0)
Hemoglobin: 11 g/dL — ABNORMAL LOW (ref 12.0–15.0)
MCH: 28.3 pg (ref 26.0–34.0)
MCHC: 32.3 g/dL (ref 30.0–36.0)
MCV: 87.7 fL (ref 80.0–100.0)
Platelets: 275 10*3/uL (ref 150–400)
RBC: 3.89 MIL/uL (ref 3.87–5.11)
RDW: 13.7 % (ref 11.5–15.5)
WBC: 5.4 10*3/uL (ref 4.0–10.5)
nRBC: 0 % (ref 0.0–0.2)

## 2019-09-07 LAB — HIV ANTIBODY (ROUTINE TESTING W REFLEX): HIV Screen 4th Generation wRfx: NONREACTIVE

## 2019-09-07 LAB — BASIC METABOLIC PANEL
Anion gap: 8 (ref 5–15)
BUN: 29 mg/dL — ABNORMAL HIGH (ref 8–23)
CO2: 22 mmol/L (ref 22–32)
Calcium: 9.2 mg/dL (ref 8.9–10.3)
Chloride: 105 mmol/L (ref 98–111)
Creatinine, Ser: 1.43 mg/dL — ABNORMAL HIGH (ref 0.44–1.00)
GFR calc Af Amer: 44 mL/min — ABNORMAL LOW (ref >60)
GFR calc non Af Amer: 38 mL/min — ABNORMAL LOW (ref >60)
Glucose, Bld: 285 mg/dL — ABNORMAL HIGH (ref 70–99)
Potassium: 3.9 mmol/L (ref 3.5–5.1)
Sodium: 135 mmol/L (ref 135–145)

## 2019-09-07 LAB — SODIUM, URINE, RANDOM: Sodium, Ur: 49 mmol/L

## 2019-09-07 LAB — GLUCOSE, CAPILLARY
Glucose-Capillary: 215 mg/dL — ABNORMAL HIGH (ref 70–99)
Glucose-Capillary: 180 mg/dL — ABNORMAL HIGH (ref 70–99)
Glucose-Capillary: 159 mg/dL — ABNORMAL HIGH (ref 70–99)
Glucose-Capillary: 178 mg/dL — ABNORMAL HIGH (ref 70–99)

## 2019-09-07 LAB — SARS CORONAVIRUS 2 BY RT PCR (HOSPITAL ORDER, PERFORMED IN ~~LOC~~ HOSPITAL LAB): SARS Coronavirus 2: NEGATIVE

## 2019-09-07 LAB — CBG MONITORING, ED: Glucose-Capillary: 200 mg/dL — ABNORMAL HIGH (ref 70–99)

## 2019-09-07 LAB — OSMOLALITY, URINE: Osmolality, Ur: 498 mOsm/kg (ref 300–900)

## 2019-09-07 MED ORDER — ACETAMINOPHEN 325 MG PO TABS
650.0000 mg | ORAL_TABLET | Freq: Four times a day (QID) | ORAL | Status: DC | PRN
  Administered 2019-09-07: 650 mg via ORAL
  Filled 2019-09-07: qty 2

## 2019-09-07 MED ORDER — HYDRALAZINE HCL 20 MG/ML IJ SOLN
10.0000 mg | INTRAMUSCULAR | Status: DC | PRN
  Administered 2019-09-07 (×2): 10 mg via INTRAVENOUS
  Filled 2019-09-07 (×2): qty 1

## 2019-09-07 MED ORDER — LORATADINE 10 MG PO TABS
10.0000 mg | ORAL_TABLET | Freq: Every day | ORAL | Status: DC
  Administered 2019-09-07 – 2019-09-08 (×2): 10 mg via ORAL
  Filled 2019-09-07 (×2): qty 1

## 2019-09-07 MED ORDER — ROSUVASTATIN CALCIUM 20 MG PO TABS
20.0000 mg | ORAL_TABLET | Freq: Every day | ORAL | Status: DC
  Administered 2019-09-07 – 2019-09-08 (×2): 20 mg via ORAL
  Filled 2019-09-07 (×2): qty 1

## 2019-09-07 MED ORDER — AMLODIPINE BESYLATE 10 MG PO TABS
10.0000 mg | ORAL_TABLET | Freq: Every day | ORAL | Status: DC
  Administered 2019-09-07 (×2): 10 mg via ORAL
  Filled 2019-09-07 (×2): qty 1

## 2019-09-07 MED ORDER — PANTOPRAZOLE SODIUM 40 MG PO TBEC
40.0000 mg | DELAYED_RELEASE_TABLET | Freq: Every day | ORAL | Status: DC
  Administered 2019-09-07 – 2019-09-08 (×2): 40 mg via ORAL
  Filled 2019-09-07 (×2): qty 1

## 2019-09-07 MED ORDER — DOCUSATE SODIUM 100 MG PO CAPS
200.0000 mg | ORAL_CAPSULE | Freq: Once | ORAL | Status: AC
  Administered 2019-09-07: 200 mg via ORAL
  Filled 2019-09-07: qty 2

## 2019-09-07 MED ORDER — INSULIN DETEMIR 100 UNIT/ML ~~LOC~~ SOLN
40.0000 [IU] | Freq: Every day | SUBCUTANEOUS | Status: DC
  Administered 2019-09-07: 40 [IU] via SUBCUTANEOUS
  Filled 2019-09-07 (×2): qty 0.4

## 2019-09-07 MED ORDER — FERROUS GLUCONATE 324 (38 FE) MG PO TABS
324.0000 mg | ORAL_TABLET | Freq: Every day | ORAL | Status: DC
  Administered 2019-09-07 – 2019-09-08 (×2): 324 mg via ORAL
  Filled 2019-09-07 (×2): qty 1

## 2019-09-07 MED ORDER — TEMAZEPAM 15 MG PO CAPS: 30.0000 mg | ORAL_CAPSULE | Freq: Every evening | ORAL | Status: DC | PRN

## 2019-09-07 MED ORDER — ASPIRIN EC 81 MG PO TBEC
81.0000 mg | DELAYED_RELEASE_TABLET | Freq: Every day | ORAL | Status: DC
  Administered 2019-09-07 – 2019-09-08 (×2): 81 mg via ORAL
  Filled 2019-09-07 (×2): qty 1

## 2019-09-07 NOTE — Progress Notes (Signed)
Spoke with patient on the phone about her diabetes. States that she has had diabetes for a very long time. States that she takes Toujeo 43 units at HS and Farxiga 10 mg at breakfast.  Had been on Metformin for a long time, but had been having side effects of diarrhea. The Metformin was stopped 3 weeks ago and started on Farxiga. She checks her blood sugars once a day either in the morning or at bedtime. They usually run about 268-293 mg/dl recently.   Reviewed HgbA1C of 9.6%. States that she had her A1C down to 6.% at one time. Encouraged her to check blood sugars at least twice a day and vary the times over a two week period. Follow up with PCP for blood glucose control.  Will continue to monitor blood sugars while in the hospital.  Harvel Ricks RN BSN CDE Diabetes Coordinator Pager: [phone]  8am-5pm

## 2019-09-07 NOTE — Progress Notes (Signed)
24 hour urine collection started at 0512. Pt instructed to save all urine and call us to transfer it to the jug. She verbalized understanding. Hortencia Conradi RN

## 2019-09-07 NOTE — Chronic Care Management (AMB) (Signed)
  Chronic Care Management   Inpatient Admit Review Note  09/07/2019 Name: GRACEMARIE SKEET MRN: [id number] DOB: 1955/11/11  ELBIA PARO is a 64 y.o. year old female who is a primary care patient of [name], MD. DELOYCE WALTHERS is actively engaged with the embedded care management team in the primary care practice and is being followed by RN Case Manager and Pharmacist for assistance with disease management and care coordination needs related to DMII and renal failure.   LAINA GUERRIERI is currently admitted to the hospital for evaluation and treatment of acute kidney injury. Current treatment plan is observation. See below from H&P.  Assessment/Plan Hyperglycemia in the setting of uncontrolled type 2 diabetes Last hemoglobin A1c several weeks ago of 9.6 Normally takes [place] and 43 units of Toujeo We will do 30 units of Levemir nightly, mealtime and nighttime sliding scale  Acute on chronic kidney disease stage II Creatinine elevated to 1.78. Could be multifactorial from prerenal to possibly medication effects of new Farxiga Obtain urine studies Follow after fluids.  Avoid nephrotoxic agent.  Asymptomatic bradycardia Continue to monitor.  However appears that patient has had low to borderline heart rate in the past.  Hypertension Continue amlodipine.  Hold HCTZ and ARB due to AKI  Hyperlipidemia Continue statin  Plan: CM team will collaborate with [facility] and embedded care management team. Care Management team will follow patient for post discharge care coordination needs. Next scheduled PharmD outreach planned for 6/23. RN Care Manager scheduled outreach for 7/21.    Daneen Schick, BSW, CDP Social Worker, Certified Dementia Practitioner [place] [facility] [place] [facility] [phone]

## 2019-09-07 NOTE — Progress Notes (Signed)
PROGRESS NOTE    Leslie Davis  SAY:[id number] DOB: 1955-08-16 DOA: 09/06/2019 PCP: [name], MD   Brief Narrative:   64 y.o. female with medical history significant for hypertension, insulin-dependent type 2 diabetes, CKD stage II, nonalcoholic liver disease, and hyperlipidemia who presents with concerns of weakness and fatigue.  Diagnosed with AKI and hyperglycemia admitted to the hospital.  Recently her Metformin was changed to [place] due to GI intolerance.  Since then her blood glucose have been running in 200s and has had polyuria.  Assessment & Plan:   Principal Problem:   Acute kidney injury ([place]) Active Problems:   Type 2 diabetes mellitus with hyperlipidemia ([facility])   Bradycardia   Hyperglycemia  Acute kidney injury, prerenal Mild to moderate dehydration -Admission creatinine 1.78, this morning is 1.4.  Baseline 1.0. -Continue IV fluids, diet as tolerated.  Supportive care.  Diabetes mellitus type 2, uncontrolled secondary to hyperglycemia -Typically takes [place] and 42 units of Toujeo at home.  Increase Levemir to 40 units and sliding scale.  Will adjust her diabetic medications over next 24 hours as she tolerates p.o.  Essential hypertension -HCTZ and ARB on hold due to AKI.  Continue Norvasc  Hyperlipidemia -Statin   DVT prophylaxis: enoxaparin (LOVENOX) injection 40 mg Start: 09/06/19 2345  Code Status: Full Family Communication: None   Dispo: The patient is from: Home              Anticipated d/c is to: Home              Anticipated d/c date is: 1 day              Patient currently is not medically stable to d/c.  Patient still feels a little dehydrated, has AKI requiring IV fluids.  Plans to slowly advance her diet today monitor renal function and get IV fluids.  Hopefully discharge tomorrow.  Subjective: Tells me her blood glucose had been running greater than 200s at home with increasing urination.  Review of Systems Otherwise negative  except as per HPI, including: General: Denies fever, chills, night sweats or unintended weight loss. Resp: Denies cough, wheezing, shortness of breath. Cardiac: Denies chest pain, palpitations, orthopnea, paroxysmal nocturnal dyspnea. GI: Denies abdominal pain, nausea, vomiting, diarrhea or constipation GU: Denies dysuria, frequency, hesitancy or incontinence MS: Denies muscle aches, joint pain or swelling Neuro: Denies headache, neurologic deficits (focal weakness, numbness, tingling), abnormal gait Psych: Denies anxiety, depression, SI/HI/AVH Skin: Denies new rashes or lesions ID: Denies sick contacts, exotic exposures, travel  Examination:  General exam: Appears calm and comfortable, dry mouth Respiratory system: Clear to auscultation. Respiratory effort normal. Cardiovascular system: S1 & S2 heard, RRR. No JVD, murmurs, rubs, gallops or clicks. No pedal edema. Gastrointestinal system: Abdomen is nondistended, soft and nontender. No organomegaly or masses felt. Normal bowel sounds heard. Central nervous system: Alert and oriented. No focal neurological deficits. Extremities: Symmetric 5 x 5 power. Skin: No rashes, lesions or ulcers Psychiatry: Judgement and insight appear normal. Mood & affect appropriate.     Objective: Vitals:   09/06/19 2258 09/07/19 0439 09/07/19 0516 09/07/19 0836  BP: (!) 156/61 (!) 154/70 (!) 178/52 (!) 167/79  Pulse: (!) 56 60 (!) 54 (!) 50  Resp: 18 16 16    Temp:   98.1 F (36.7 C) (!) 97.5 F (36.4 C)  TempSrc:   Oral Oral  SpO2: 98% 97% 97% 97%  Weight:   95.1 kg   Height:  Intake/Output Summary (Last 24 hours) at 09/07/2019 1051 Last data filed at 09/07/2019 0900 Gross per 24 hour  Intake 1136.37 ml  Output 600 ml  Net 536.37 ml   Filed Weights   09/06/19 2035 09/07/19 0516  Weight: 94.3 kg 95.1 kg     Data Reviewed:   CBC: Recent Labs  Lab 09/06/19 2057 09/07/19 0538  WBC 6.1 5.4  HGB 11.4* 11.0*  HCT 35.6* 34.1*  MCV  87.5 87.7  PLT 278 580   Basic Metabolic Panel: Recent Labs  Lab 09/06/19 2057 09/07/19 0538  NA 134* 135  K 4.0 3.9  CL 102 105  CO2 22 22  GLUCOSE 307* 285*  BUN 30* 29*  CREATININE 1.78* 1.43*  CALCIUM 9.4 9.2   GFR: Estimated Creatinine Clearance: 43.9 mL/min (A) (by C-G formula based on SCr of 1.43 mg/dL (H)). Liver Function Tests: Recent Labs  Lab 09/06/19 2304  AST 29  ALT 38  ALKPHOS 125  BILITOT 0.5  PROT 7.8  ALBUMIN 4.2   No results for input(s): LIPASE, AMYLASE in the last 168 hours. No results for input(s): AMMONIA in the last 168 hours. Coagulation Profile: No results for input(s): INR, PROTIME in the last 168 hours. Cardiac Enzymes: No results for input(s): CKTOTAL, CKMB, CKMBINDEX, TROPONINI in the last 168 hours. BNP (last 3 results) No results for input(s): PROBNP in the last 8760 hours. HbA1C: No results for input(s): HGBA1C in the last 72 hours. CBG: Recent Labs  Lab 09/06/19 2051 09/07/19 0126 09/07/19 0744  GLUCAP 317* 200* 215*   Lipid Profile: No results for input(s): CHOL, HDL, LDLCALC, TRIG, CHOLHDL, LDLDIRECT in the last 72 hours. Thyroid Function Tests: No results for input(s): TSH, T4TOTAL, FREET4, T3FREE, THYROIDAB in the last 72 hours. Anemia Panel: No results for input(s): VITAMINB12, FOLATE, FERRITIN, TIBC, IRON, RETICCTPCT in the last 72 hours. Sepsis Labs: No results for input(s): PROCALCITON, LATICACIDVEN in the last 168 hours.  Recent Results (from the past 240 hour(s))  SARS Coronavirus 2 by RT PCR (hospital order, performed in [facility] hospital lab) Nasopharyngeal Nasopharyngeal Swab     Status: None   Collection Time: 09/07/19 12:30 AM   Specimen: Nasopharyngeal Swab  Result Value Ref Range Status   SARS Coronavirus 2 NEGATIVE NEGATIVE Final    Comment: (NOTE) SARS-CoV-2 target nucleic acids are NOT DETECTED.  The SARS-CoV-2 RNA is generally detectable in upper and lower respiratory specimens during the  acute phase of infection. The lowest concentration of SARS-CoV-2 viral copies this assay can detect is 250 copies / mL. A negative result does not preclude SARS-CoV-2 infection and should not be used as the sole basis for treatment or other patient management decisions.  A negative result may occur with improper specimen collection / handling, submission of specimen other than nasopharyngeal swab, presence of viral mutation(s) within the areas targeted by this assay, and inadequate number of viral copies (<250 copies / mL). A negative result must be combined with clinical observations, patient history, and epidemiological information.  Fact Sheet for Patients:   StrictlyIdeas.no  Fact Sheet for Healthcare Providers: BankingDealers.co.za  This test is not yet approved or  cleared by the [place] FDA and has been authorized for detection and/or diagnosis of SARS-CoV-2 by FDA under an Emergency Use Authorization (EUA).  This EUA will remain in effect (meaning this test can be used) for the duration of the COVID-19 declaration under Section 564(b)(1) of the Act, 21 U.S.C. section 360bbb-3(b)(1), unless the authorization is terminated  or revoked sooner.  Performed at [facility], [place] [address] [place], [place] [address]          Radiology Studies: [facility] Chest Port 1 View  Result Date: 09/06/2019 CLINICAL DATA:  Shortness of breath EXAM: PORTABLE CHEST 1 VIEW COMPARISON:  10/11/2018 FINDINGS: Borderline cardiac size. No edema. Aortic atherosclerosis. Both lungs are clear. The visualized skeletal structures are unremarkable. IMPRESSION: No active disease. Electronically Signed   By: Donavan Foil M.D.   On: 09/06/2019 23:25        Scheduled Meds: . amLODipine  10 mg Oral QHS  . aspirin EC  81 mg Oral Daily  . enoxaparin (LOVENOX) injection  40 mg Subcutaneous QHS  . ferrous gluconate  324 mg Oral Q  breakfast  . insulin aspart  0-15 Units Subcutaneous TID WC  . insulin aspart  0-5 Units Subcutaneous QHS  . insulin detemir  30 Units Subcutaneous QHS  . loratadine  10 mg Oral Daily  . pantoprazole  40 mg Oral Daily  . rosuvastatin  20 mg Oral Daily   Continuous Infusions:   LOS: 0 days   Time spent= 35 mins    Sheranda Seabrooks Arsenio Loader, MD [facility]  If 7PM-7AM, please contact night-coverage  09/07/2019, 10:51 AM

## 2019-09-07 NOTE — Plan of Care (Signed)
Pt mildly hypotensive this am.  Amlodipine administered per MD.  Resting comfortably at this time with no complaints. 24 hour urine initiated.   Problem: Education: Goal: Knowledge of General Education information will improve Description: Including pain rating scale, medication(s)/side effects and non-pharmacologic comfort measures Outcome: Progressing   Problem: Health Behavior/Discharge Planning: Goal: Ability to manage health-related needs will improve Outcome: Progressing   Problem: Clinical Measurements: Goal: Ability to maintain clinical measurements within normal limits will improve Outcome: Progressing Goal: Will remain free from infection Outcome: Progressing Goal: Diagnostic test results will improve Outcome: Progressing Goal: Respiratory complications will improve Outcome: Progressing Goal: Cardiovascular complication will be avoided Outcome: Progressing   Problem: Activity: Goal: Risk for activity intolerance will decrease Outcome: Progressing   Problem: Nutrition: Goal: Adequate nutrition will be maintained Outcome: Progressing   Problem: Coping: Goal: Level of anxiety will decrease Outcome: Progressing   Problem: Elimination: Goal: Will not experience complications related to bowel motility Outcome: Progressing Goal: Will not experience complications related to urinary retention Outcome: Progressing   Problem: Pain Managment: Goal: General experience of comfort will improve Outcome: Progressing   Problem: Safety: Goal: Ability to remain free from injury will improve Outcome: Progressing   Problem: Skin Integrity: Goal: Risk for impaired skin integrity will decrease Outcome: Progressing

## 2019-09-08 LAB — CBC
HCT: 35.4 % — ABNORMAL LOW (ref 36.0–46.0)
Hemoglobin: 11.5 g/dL — ABNORMAL LOW (ref 12.0–15.0)
MCH: 28 pg (ref 26.0–34.0)
MCHC: 32.5 g/dL (ref 30.0–36.0)
MCV: 86.1 fL (ref 80.0–100.0)
Platelets: 292 10*3/uL (ref 150–400)
RBC: 4.11 MIL/uL (ref 3.87–5.11)
RDW: 13.8 % (ref 11.5–15.5)
WBC: 5.2 10*3/uL (ref 4.0–10.5)
nRBC: 0 % (ref 0.0–0.2)

## 2019-09-08 LAB — CREATININE, URINE, 24 HOUR
Collection Interval-UCRE24: 24 hours
Creatinine, 24H Ur: 1135 mg/d (ref 600–1800)
Creatinine, Urine: 40.53 mg/dL
Urine Total Volume-UCRE24: 2800 mL

## 2019-09-08 LAB — BASIC METABOLIC PANEL
Anion gap: 9 (ref 5–15)
BUN: 25 mg/dL — ABNORMAL HIGH (ref 8–23)
CO2: 21 mmol/L — ABNORMAL LOW (ref 22–32)
Calcium: 9.2 mg/dL (ref 8.9–10.3)
Chloride: 105 mmol/L (ref 98–111)
Creatinine, Ser: 1.15 mg/dL — ABNORMAL HIGH (ref 0.44–1.00)
GFR calc Af Amer: 58 mL/min — ABNORMAL LOW (ref >60)
GFR calc non Af Amer: 50 mL/min — ABNORMAL LOW (ref >60)
Glucose, Bld: 168 mg/dL — ABNORMAL HIGH (ref 70–99)
Potassium: 4.1 mmol/L (ref 3.5–5.1)
Sodium: 135 mmol/L (ref 135–145)

## 2019-09-08 LAB — GLUCOSE, CAPILLARY
Glucose-Capillary: 172 mg/dL — ABNORMAL HIGH (ref 70–99)
Glucose-Capillary: 181 mg/dL — ABNORMAL HIGH (ref 70–99)

## 2019-09-08 LAB — MAGNESIUM: Magnesium: 2.4 mg/dL (ref 1.7–2.4)

## 2019-09-08 MED ORDER — MAGNESIUM CITRATE PO SOLN
1.0000 | Freq: Once | ORAL | Status: AC
  Administered 2019-09-08: 1 via ORAL
  Filled 2019-09-08: qty 296

## 2019-09-08 MED ORDER — TOUJEO SOLOSTAR 300 UNIT/ML ~~LOC~~ SOPN: 45.0000 [IU] | PEN_INJECTOR | Freq: Every day | SUBCUTANEOUS | 1 refills | Status: DC

## 2019-09-08 NOTE — Progress Notes (Signed)
Patient discharged to home with family, discharge instructions reviewed with patient who verbalized understanding. No new medications. 

## 2019-09-08 NOTE — Discharge Summary (Signed)
Physician Discharge Summary  Leslie Davis TKZ:[id number] DOB: 1955-04-16 DOA: 09/06/2019  PCP: [name], MD  Admit date: 09/06/2019 Discharge date: 09/08/2019  Admitted From: home Disposition:  home  Recommendations for Outpatient Follow-up:  1. Follow up with PCP in 1-2 weeks 2. Please obtain BMP/CBC in one week 3. Please follow up on patients glycemic control and diabetes regimen.  Her Toujeo was increased to 45 units nightly.  Patient and I discussed adding mealtime insulin, but she preferred to keep just one insulin for now, until following up outpatient.    Home Health: No  Equipment/Devices: None   Discharge Condition: Stable  CODE STATUS: Full  Diet recommendation: Heart Healthy / Carb Modified    Discharge Diagnoses: Principal Problem:   Acute kidney injury ([place]) Active Problems:   Type 2 diabetes mellitus with hyperlipidemia (H[facility])   Bradycardia   Hyperglycemia   AKI (acute kidney injury) ([place])    Summary of HPI and Hospital Course:   64 y.o.femalewith medical history significant forhypertension, insulin-dependent type 2 diabetes, CKD stage II, nonalcoholic liver disease, and hyperlipidemia who presents with concerns of weakness and fatigue.  Diagnosed with AKI and hyperglycemia admitted to the hospital.  Recently her Metformin was changed to [place] due to GI intolerance.  Since then her blood glucose have been running in 200s and has had polyuria.  Acute kidney injury, prerenal Mild to moderate dehydration -Admission creatinine 1.78, improved with IV hydration.   Baseline Creatinine 1.0.  Repeat BMP in 1-2 week follow up.   Diabetes mellitus type 2, uncontrolled secondary to hyperglycemia Typically takes [place] and 42 units of Toujeo at home.   Glucose improved with slight increase in Toujeo to 45 units.   Continue on [place].  Close follow up with PCP.  Essential hypertension -Continue home regimen.  HCTZ was held during admission  due to AKI, resumed on d/c.  Hyperlipidemia -continue statin   Discharge Instructions   Discharge Instructions    Call MD for:   Complete by: As directed    Excessive thirst or excessive urination.   Blood sugars uncontrolled - if having frequent high or low sugars.   Call MD for:  extreme fatigue   Complete by: As directed    Call MD for:  persistant nausea and vomiting   Complete by: As directed    Call MD for:  temperature >100.4   Complete by: As directed    Diet - low sodium heart healthy   Complete by: As directed    Discharge instructions   Complete by: As directed    Please check your blood sugar at home 3-4 times daily (AM, before lunch and dinner, before bed).   Write down your sugars and bring to your follow up PCP appointment.  We increased your Toujeo based on the amount of mealtime insulin you needed in the hospital.   As you and I discussed, starting mealtime insulin would be preferable, but you can discuss this with your PCP if the increased Toujeo is not sufficient for controlling your blood sugar.   Increase activity slowly   Complete by: As directed      Allergies as of 09/08/2019   No Known Allergies     Medication List    TAKE these medications   amLODipine 10 MG tablet Commonly known as: NORVASC TAKE 1 TABLET ONCE DAILY. What changed: when to take this   aspirin EC 81 MG tablet Take 81 mg by mouth daily.   cetirizine 10  MG tablet Commonly known as: ZYRTEC Take 10 mg by mouth daily.   dapagliflozin propanediol 10 MG Tabs tablet Commonly known as: Farxiga Take 1 tablet (10 mg total) by mouth daily before breakfast.   Edarbi 80 MG Tabs Generic drug: Azilsartan Medoxomil TAKE 1 TABLET EACH DAY. What changed: See the new instructions.   ferrous gluconate 324 MG tablet Commonly known as: FERGON TAKE 1 TABLET BY MOUTH TWICE DAILY WITH A MEAL. What changed: See the new instructions.   hydrochlorothiazide 12.5 MG tablet Commonly known as:  HYDRODIURIL Take 1 tablet (12.5 mg total) by mouth daily.   Insulin Pen Needle 32G X 6 MM Misc Use with pen injectors daily dx code: O87.8   OneTouch Delica Lancets 67E Misc Use as directed to check blood sugars 2 times per day dx:e11.65   OneTouch Verio test strip Generic drug: glucose blood Use as instructed to check blood sugars 2 times per day dx:e11.65   OneTouch Verio w/Device Kit Use as directed to check blood sugars 2 times per day dx: e11.65   pantoprazole 40 MG tablet Commonly known as: PROTONIX Take 1 tablet (40 mg total) by mouth daily.   rosuvastatin 20 MG tablet Commonly known as: CRESTOR Take 1 tablet (20 mg total) by mouth daily.   temazepam 30 MG capsule Commonly known as: RESTORIL One capsules po qhs prn What changed:   how much to take  how to take this  when to take this  reasons to take this  additional instructions   Toujeo SoloStar 300 UNIT/ML Solostar Pen Generic drug: insulin glargine (1 Unit Dial) Inject 45 Units into the skin at bedtime. What changed: how much to take       No Known Allergies  Consultations:  none    Procedures/Studies: DG Chest Port 1 View  Result Date: 09/06/2019 CLINICAL DATA:  Shortness of breath EXAM: PORTABLE CHEST 1 VIEW COMPARISON:  10/11/2018 FINDINGS: Borderline cardiac size. No edema. Aortic atherosclerosis. Both lungs are clear. The visualized skeletal structures are unremarkable. IMPRESSION: No active disease. Electronically Signed   By: Donavan Foil M.D.   On: 09/06/2019 23:25       Subjective: Patient seen this AM.  Reports she feels well.  Has PCP appointment scheduled. Denies acute complaints including fever/chills, N/V/D, CP or SOB.   Discharge Exam: Vitals:   09/08/19 0628 09/08/19 1336  BP: (!) 146/62 (!) 175/70  Pulse: (!) 59 74  Resp: 16 16  Temp: 98.4 F (36.9 C) 97.9 F (36.6 C)  SpO2: 97% 97%   Vitals:   09/07/19 2302 09/08/19 0011 09/08/19 0628 09/08/19 1336  BP: (!)  182/71 132/61 (!) 146/62 (!) 175/70  Pulse: (!) 52 62 (!) 59 74  Resp:   16 16  Temp:   98.4 F (36.9 C) 97.9 F (36.6 C)  TempSrc:   Oral Oral  SpO2:   97% 97%  Weight:      Height:        General: Pt is alert, awake, not in acute distress Cardiovascular: RRR, S1/S2 +, no rubs, no gallops Respiratory: CTA bilaterally, no wheezing, no rhonchi Abdominal: Soft, NT, ND, bowel sounds + Extremities: no edema, no cyanosis    The results of significant diagnostics from this hospitalization (including imaging, microbiology, ancillary and laboratory) are listed below for reference.     Microbiology: Recent Results (from the past 240 hour(s))  SARS Coronavirus 2 by RT PCR (hospital order, performed in [facility] hospital lab) Nasopharyngeal Nasopharyngeal Swab  Status: None   Collection Time: 09/07/19 12:30 AM   Specimen: Nasopharyngeal Swab  Result Value Ref Range Status   SARS Coronavirus 2 NEGATIVE NEGATIVE Final    Comment: (NOTE) SARS-CoV-2 target nucleic acids are NOT DETECTED.  The SARS-CoV-2 RNA is generally detectable in upper and lower respiratory specimens during the acute phase of infection. The lowest concentration of SARS-CoV-2 viral copies this assay can detect is 250 copies / mL. A negative result does not preclude SARS-CoV-2 infection and should not be used as the sole basis for treatment or other patient management decisions.  A negative result may occur with improper specimen collection / handling, submission of specimen other than nasopharyngeal swab, presence of viral mutation(s) within the areas targeted by this assay, and inadequate number of viral copies (<250 copies / mL). A negative result must be combined with clinical observations, patient history, and epidemiological information.  Fact Sheet for Patients:   StrictlyIdeas.no  Fact Sheet for Healthcare Providers: BankingDealers.co.za  This test is  not yet approved or  cleared by the [place] FDA and has been authorized for detection and/or diagnosis of SARS-CoV-2 by FDA under an Emergency Use Authorization (EUA).  This EUA will remain in effect (meaning this test can be used) for the duration of the COVID-19 declaration under Section 564(b)(1) of the Act, 21 U.S.C. section 360bbb-3(b)(1), unless the authorization is terminated or revoked sooner.  Performed at [facility], [place] [address] [place], [place] [address]      Labs: BNP (last 3 results) No results for input(s): BNP in the last 8760 hours. Basic Metabolic Panel: Recent Labs  Lab 09/06/19 2057 09/07/19 0538 09/08/19 0633  NA 134* 135 135  K 4.0 3.9 4.1  CL 102 105 105  CO2 22 22 21*  GLUCOSE 307* 285* 168*  BUN 30* 29* 25*  CREATININE 1.78* 1.43* 1.15*  CALCIUM 9.4 9.2 9.2  MG  --   --  2.4   Liver Function Tests: Recent Labs  Lab 09/06/19 2304  AST 29  ALT 38  ALKPHOS 125  BILITOT 0.5  PROT 7.8  ALBUMIN 4.2   No results for input(s): LIPASE, AMYLASE in the last 168 hours. No results for input(s): AMMONIA in the last 168 hours. CBC: Recent Labs  Lab 09/06/19 2057 09/07/19 0538 09/08/19 0633  WBC 6.1 5.4 5.2  HGB 11.4* 11.0* 11.5*  HCT 35.6* 34.1* 35.4*  MCV 87.5 87.7 86.1  PLT 278 275 292   Cardiac Enzymes: No results for input(s): CKTOTAL, CKMB, CKMBINDEX, TROPONINI in the last 168 hours. BNP: Invalid input(s): POCBNP CBG: Recent Labs  Lab 09/07/19 1151 09/07/19 1648 09/07/19 2203 09/08/19 0737 09/08/19 1130  GLUCAP 180* 159* 178* 172* 181*   D-Dimer No results for input(s): DDIMER in the last 72 hours. Hgb A1c No results for input(s): HGBA1C in the last 72 hours. Lipid Profile No results for input(s): CHOL, HDL, LDLCALC, TRIG, CHOLHDL, LDLDIRECT in the last 72 hours. Thyroid function studies No results for input(s): TSH, T4TOTAL, T3FREE, THYROIDAB in the last 72 hours.  Invalid input(s):  FREET3 Anemia work up No results for input(s): VITAMINB12, FOLATE, FERRITIN, TIBC, IRON, RETICCTPCT in the last 72 hours. Urinalysis    Component Value Date/Time   COLORURINE STRAW (A) 09/06/2019 2057   APPEARANCEUR CLEAR 09/06/2019 2057   LABSPEC 1.018 09/06/2019 2057   PHURINE 5.0 09/06/2019 2057   GLUCOSEU >=500 (A) 09/06/2019 2057   HGBUR NEGATIVE 09/06/2019 2057   BILIRUBINUR NEGATIVE 09/06/2019 2057  BILIRUBINUR Negative 11/08/2018 1302   KETONESUR NEGATIVE 09/06/2019 2057   PROTEINUR NEGATIVE 09/06/2019 2057   UROBILINOGEN 0.2 11/08/2018 1302   UROBILINOGEN 0.2 02/15/2014 2349   NITRITE NEGATIVE 09/06/2019 2057   LEUKOCYTESUR NEGATIVE 09/06/2019 2057   Sepsis Labs Invalid input(s): PROCALCITONIN,  WBC,  LACTICIDVEN Microbiology Recent Results (from the past 240 hour(s))  SARS Coronavirus 2 by RT PCR (hospital order, performed in [place] hospital lab) Nasopharyngeal Nasopharyngeal Swab     Status: None   Collection Time: 09/07/19 12:30 AM   Specimen: Nasopharyngeal Swab  Result Value Ref Range Status   SARS Coronavirus 2 NEGATIVE NEGATIVE Final    Comment: (NOTE) SARS-CoV-2 target nucleic acids are NOT DETECTED.  The SARS-CoV-2 RNA is generally detectable in upper and lower respiratory specimens during the acute phase of infection. The lowest concentration of SARS-CoV-2 viral copies this assay can detect is 250 copies / mL. A negative result does not preclude SARS-CoV-2 infection and should not be used as the sole basis for treatment or other patient management decisions.  A negative result may occur with improper specimen collection / handling, submission of specimen other than nasopharyngeal swab, presence of viral mutation(s) within the areas targeted by this assay, and inadequate number of viral copies (<250 copies / mL). A negative result must be combined with clinical observations, patient history, and epidemiological information.  Fact Sheet for  Patients:   StrictlyIdeas.no  Fact Sheet for Healthcare Providers: BankingDealers.co.za  This test is not yet approved or  cleared by the [place] FDA and has been authorized for detection and/or diagnosis of SARS-CoV-2 by FDA under an Emergency Use Authorization (EUA).  This EUA will remain in effect (meaning this test can be used) for the duration of the COVID-19 declaration under Section 564(b)(1) of the Act, 21 U.S.C. section 360bbb-3(b)(1), unless the authorization is terminated or revoked sooner.  Performed at [facility], [place] [address], [place], [place] [address]      Time coordinating discharge: Over 30 minutes  SIGNED:   Ezekiel Slocumb, DO [facility] 09/08/2019, 3:45 PM   If 7PM-7AM, please contact night-coverage [email or web address]

## 2019-09-10 ENCOUNTER — Telehealth: Payer: Self-pay

## 2019-09-10 NOTE — Telephone Encounter (Signed)
Transition Care Management Follow-up Telephone Call  Date of discharge and from where: 09/07/2019 [facility]  How have you been since you were released from the hospital? Okay  Any questions or concerns? No  Items Reviewed:  Did the pt receive and understand the discharge instructions provided? Yes  Medications obtained and verified? Yes  Any new allergies since your discharge?No  Dietary orders reviewed? Low Card  Do you have support at home? Yes  Other (ie: DME, Home Health, etc) N/A  Functional Questionnaire: (I = Independent and D = Dependent) ADL's: I  Bathing/Dressing-I   Meal Prep- I  Eating- I  Maintaining continence- I  Transferring/Ambulation- I  Managing Meds- I   Follow up appointments reviewed:    PCP Hospital f/u appt confirmed?  Scheduled to see Dr. Baird Cancer on 06/22/2021at 3:15 pm  Specialist Hospital f/u appt confirmed?  Scheduled to seen/a  Are transportation arrangements needed? NO  If their condition worsens, is the pt aware to call  their PCP or go to the ED? Yes  Was the patient provided with contact information for the PCP's office or ED?Yes  Was the pt encouraged to call back with questions or concerns? Yes

## 2019-09-11 ENCOUNTER — Ambulatory Visit (INDEPENDENT_AMBULATORY_CARE_PROVIDER_SITE_OTHER): Payer: Medicare Other | Admitting: Internal Medicine

## 2019-09-11 ENCOUNTER — Encounter: Payer: Self-pay | Admitting: Internal Medicine

## 2019-09-11 ENCOUNTER — Other Ambulatory Visit: Payer: Self-pay

## 2019-09-11 VITALS — BP 128/76 | HR 55 | Temp 97.4°F | Ht 64.0 in | Wt 207.8 lb

## 2019-09-11 DIAGNOSIS — N182 Chronic kidney disease, stage 2 (mild): Secondary | ICD-10-CM | POA: Diagnosis not present

## 2019-09-11 DIAGNOSIS — M791 Myalgia, unspecified site: Secondary | ICD-10-CM | POA: Diagnosis not present

## 2019-09-11 DIAGNOSIS — R0683 Snoring: Secondary | ICD-10-CM

## 2019-09-11 DIAGNOSIS — Z794 Long term (current) use of insulin: Secondary | ICD-10-CM

## 2019-09-11 DIAGNOSIS — N179 Acute kidney failure, unspecified: Secondary | ICD-10-CM

## 2019-09-11 DIAGNOSIS — I129 Hypertensive chronic kidney disease with stage 1 through stage 4 chronic kidney disease, or unspecified chronic kidney disease: Secondary | ICD-10-CM

## 2019-09-11 DIAGNOSIS — E1122 Type 2 diabetes mellitus with diabetic chronic kidney disease: Secondary | ICD-10-CM

## 2019-09-11 NOTE — Patient Instructions (Signed)
Increase to 47 units Toujeo

## 2019-09-12 ENCOUNTER — Ambulatory Visit: Payer: Self-pay

## 2019-09-12 ENCOUNTER — Other Ambulatory Visit: Payer: Self-pay | Admitting: Internal Medicine

## 2019-09-12 DIAGNOSIS — I10 Essential (primary) hypertension: Secondary | ICD-10-CM

## 2019-09-12 DIAGNOSIS — N182 Chronic kidney disease, stage 2 (mild): Secondary | ICD-10-CM

## 2019-09-12 DIAGNOSIS — E1122 Type 2 diabetes mellitus with diabetic chronic kidney disease: Secondary | ICD-10-CM

## 2019-09-12 LAB — BMP8+EGFR
BUN/Creatinine Ratio: 23 (ref 12–28)
BUN: 32 mg/dL — ABNORMAL HIGH (ref 8–27)
CO2: 22 mmol/L (ref 20–29)
Calcium: 10.1 mg/dL (ref 8.7–10.3)
Chloride: 97 mmol/L (ref 96–106)
Creatinine, Ser: 1.42 mg/dL — ABNORMAL HIGH (ref 0.57–1.00)
GFR calc Af Amer: 45 mL/min/{1.73_m2} — ABNORMAL LOW (ref >59)
GFR calc non Af Amer: 39 mL/min/{1.73_m2} — ABNORMAL LOW (ref >59)
Glucose: 259 mg/dL — ABNORMAL HIGH (ref 65–99)
Potassium: 5.4 mmol/L — ABNORMAL HIGH (ref 3.5–5.2)
Sodium: 132 mmol/L — ABNORMAL LOW (ref 134–144)

## 2019-09-12 LAB — MAGNESIUM: Magnesium: 2.6 mg/dL — ABNORMAL HIGH (ref 1.6–2.3)

## 2019-09-12 NOTE — Chronic Care Management (AMB) (Signed)
Chronic Care Management Pharmacy  Name: Leslie Davis  MRN: [id number] DOB: 1955-05-07  Chief Complaint/ HPI  Leslie Davis,  64 y.o. , female presents for their Follow-Up CCM visit with the clinical pharmacist via telephone.  PCP : [name], MD  Their chronic conditions include: Hypertension, Diabetes with CKD, Hyperlipidemia, GERD, Insomnia  Office Visits: 09/11/19 OV: Toujeo increased to 47 units daily and Fiasp started per sliding scale. Stop Wilder Glade due to acute kidney injury.   08/13/19 OV: Presented for diabetes check. Pt reported that she would like to stop metformin because of side effects (diarrhea). Metformin discontinued and started Farxiga 62m daily. Pt advised to stay well hydrated and follow up in 4 weeks. Labs ordered (HgbA1c, BMP8+EGFR, Lipid panel). Will consider discontinuing HCTZ at next visit.   05/16/19 OV: Presented for DM and HTN follow up. Pt complained of GI issues with metformin. Advised to change metformin dose to once daily. Labs ordered (BMP8+EGFR, HgbA1c, ANA, IFA w/ reflex, TSH, Testosterone total). Referred to Dermatology for evaluation of hair thinning. Dietary and exercise recommendations provided. Exercise and weight loss encouraged.   02/08/19 OV: Presented for routine wellness examination. Labs ordered (Hgb A1c, lipid panel). EKG done with no new changes noted.   Consult Visits: 09/06/19-09/08/19 ED admission: Presented for evaluation of generalized fatigue and weakness occurring for the last 2 weeks. Pt reported symptoms are getting worse. Some generalized muscle spasms also noted. Mild dyspnea present. Headache present but no vision changes. Pt also noted BG has been running higher than normal. Creatinine significantly elevated compared to 5/24. Mild hyponatremia. Glucose elevated without evidence of ketoacidosis. ECG showed nonspecific T wave changes (new compared to 2020). Acute kidney injury possibly due to new medication, Farxiga. Hold  HCTZ and ARB due to AKI.   07/26/19 Orthopedic surgery OV w/ Dr. CClance Bolland PT: Presented for follow up 5 weeks post left knee arthroscopy w/ partial medial menisectomy. Pt reported pain she experienced before surgery is gone. Physical therapy goals completed. Pt to continue home exercise program. Follow up in 6 weeks.   06/26/19 Sports Medicine OV w/ C. Robbins: Presented for follow up 1 week post knee arthroscopy  and partial menisectomy and reported doing well. Pt to progress to outpatient physical therapy. Follow up in 4 weeks.    06/20/19: Left knee arthroscopy and partial medial menisectomy performed by C. RUnk Lightning 05/23/19 Sports Medicine OV w/ C. Robbins: Presented for second opinion for chronic left knee pain. Past treatments have included cortisone injections and gel lubrication injection. Pt advised to wait 2 more weeks to determine if gel lubrication injection provides any symptom resolution. If no relief, pt will have knee scope for partial meniscectomy.   05/08/19 Orthopedic OV w. Dr. Case: Presented for left knee Monovisc injection for left knee osteoarthritis  05/07/19 ED visit: Presented for injury to the right shoulder that occurred yesterday while performing weight assisted pull ups. Pt reported moderate to severe, throbbing pain. Follow up with orthopedic. Pt given sling for comfort.   05/02/19 Orthopedic OV w. Dr. Case: Presented for left knee Monovisc injection for left knee osteoarthritis  02/06/19 Physical Therapy for generalized muscle weakness, chronic left knee pain, joint stiffness, and difficulty walking (2x weekly)  CCM Encounters: 08/28/19 SW: Contacted pt to assist with obtaining BP meter, but pt stated she had already gotten one.   08/14/19 RN: Review of diabetes treatment regimen and discussed appropriate goals. Evaluated current treatment plan for hypertriglyceridemia. Pt education provided.   07/18/19  RN: Reviewed current treatment plans relating to acute and chronic  conditions. Provided patient education.   06/11/19 PharmD: Comprehensive medication review performed. Toujeo increased to 34 units daily. Dietary and exercise recommendations provided.   03/30/19 RN: Evaluation of current treatment plans and patient education relating to disease states, diet, and exercise.   02/19/19 PharmD: Comprehensive medication review performed  02/12/19 RN: Evaluation of current treatment plans and patient education relating to disease states, diet, and exercise. Collaboration with PharmD regarding medications.  Medications: Outpatient Encounter Medications as of 09/12/2019  Medication Sig  . amLODipine (NORVASC) 10 MG tablet TAKE 1 TABLET ONCE DAILY. (Patient taking differently: Take 10 mg by mouth at bedtime. )  . aspirin EC 81 MG tablet Take 81 mg by mouth daily.  . Blood Glucose Monitoring Suppl (ONETOUCH VERIO) w/Device KIT Use as directed to check blood sugars 2 times per day dx: e11.65  . cetirizine (ZYRTEC) 10 MG tablet Take 10 mg by mouth daily.   Marland Kitchen EDARBI 80 MG TABS TAKE 1 TABLET EACH DAY. (Patient taking differently: Take 80 mg by mouth daily after breakfast. )  . ferrous gluconate (FERGON) 324 MG tablet TAKE 1 TABLET BY MOUTH TWICE DAILY WITH A MEAL.  Marland Kitchen glucose blood (ONETOUCH VERIO) test strip Use as instructed to check blood sugars 2 times per day dx:e11.65  . hydrochlorothiazide (HYDRODIURIL) 12.5 MG tablet Take 1 tablet (12.5 mg total) by mouth daily.  . insulin glargine, 1 Unit Dial, (TOUJEO SOLOSTAR) 300 UNIT/ML Solostar Pen Inject 45 Units into the skin at bedtime. (Patient taking differently: Inject 47 Units into the skin at bedtime. )  . Insulin Pen Needle 32G X 6 MM MISC Use with pen injectors daily dx code: e11.9  . OneTouch Delica Lancets 44R MISC Use as directed to check blood sugars 2 times per day dx:e11.65  . pantoprazole (PROTONIX) 40 MG tablet Take 1 tablet (40 mg total) by mouth daily.  . rosuvastatin (CRESTOR) 20 MG tablet Take 1 tablet (20  mg total) by mouth daily.  . temazepam (RESTORIL) 30 MG capsule One capsules po qhs prn (Patient taking differently: Take 30 mg by mouth at bedtime as needed for sleep. )  . dapagliflozin propanediol (FARXIGA) 10 MG TABS tablet Take 1 tablet (10 mg total) by mouth daily before breakfast. (Patient not taking: Reported on 09/12/2019)   No facility-administered encounter medications on file as of 09/12/2019.     Current Diagnosis/Assessment:  Goals Addressed            This Visit's Progress   . Pharmacy Care Plan       CARE PLAN ENTRY  Current Barriers:  . Chronic Disease Management support, education, and care coordination needs related to Hypertension and Diabetes   Hypertension . Pharmacist Clinical Goal(s): o Over the next 45 days, patient will work with PharmD and providers to achieve BP goal <130/80 . Current regimen:  o Amlodipine 77m daily o Edarbi 888mdaily o Hydrochlorothiazide 12.42m83maily . Interventions: o Focus on healthy, balanced diet, limiting salt intake o Mailed diet recommendations o Edarbi and HCTZ held during hospitalization (6/17), but patient stated she resumed at discharged. Notified PCP that patient has been taking both medications since Sunday, 6/20.  . PMarland Kitchentient self care activities - Over the next 45 days, patient will: o Check BP daily and if symptomatic, document, and provide at future appointments o Ensure daily salt intake < 2300 mg/day o Continue exercise regimen  Diabetes . Pharmacist Clinical Goal(s): o Over the  next 45 days, patient will work with PharmD and providers to achieve A1c goal <7% . Current regimen:  o Toujeo Solostar 47 units daily o Fiasp sliding scale . Interventions: o Discussed diet recommendations and meal planning in depth o Mailed patient diet recommendations and meal planning resource . Patient self care activities - Over the next 45 days, patient will: o Check blood sugar before meals, document, and provide at future  appointments o Contact provider with any episodes of hypoglycemia  Medication management . Pharmacist Clinical Goal(s): o Over the next 45 days, patient will work with PharmD and providers to achieve optimal medication adherence . Current pharmacy: [facility] . Interventions o Comprehensive medication review performed. o Utilize [facility] for medication synchronization, packaging and delivery . Patient self care activities - Over the next 45 days, patient will: o Focus on medication adherence by medication synchronization and using adherence packaging o Take medications as prescribed o Report any questions or concerns to PharmD and/or provider(s)  Please see past updates related to this goal by clicking on the "Past Updates" button in the selected goal         Diabetes   Recent Relevant Labs: Lab Results  Component Value Date/Time   HGBA1C 9.6 (H) 08/13/2019 11:53 AM   HGBA1C 8.4 (H) 05/16/2019 03:08 PM    Kidney Function Lab Results  Component Value Date/Time   CREATININE 1.42 (H) 09/11/2019 03:58 PM   CREATININE 1.15 (H) 09/08/2019 06:33 AM   CREATININE 0.74 08/30/2011 01:07 PM   GFR 77.09 11/17/2017 09:00 AM   GFRNONAA 39 (L) 09/11/2019 03:58 PM   GFRAA 45 (L) 09/11/2019 03:58 PM  Stage 3a CKD  Checking BG: Before meals  Recent FBG Readings:246, 241 Recent pre-meal BG readings: 238, 230 Patient has failed these meds in past: Ozempic (stopped d/t SE), Tresiba, Levemir, Novolog 70/30, Januvia Patient is currently uncontrolled on the following medications:   Toujeo Solostar 47 units daily  Fiasp sliding scale  Last diabetic Foot exam: 11/08/18 Last diabetic Eye exam: 11/03/18 Lab Results  Component Value Date/Time   HMDIABEYEEXA No Retinopathy 08/02/2019 12:00 AM     We discussed:   Diet extensively  Pt stated she was on a low carb diet in hospital but is not sure what she can eat  Reviewed carb contents of common foods: cereal, oatmeal,  rice, fruit, bread, etc  Advised pt that berries are generally lower in sugar than other fruits such as bananas, cherries, and apples  Explained "Plate Method" for meal planning  Pt inquired about drinking Sprite zero. Explained that while it does have zero sugar, it contains aspartame. Encouraged pt to drink mostly water, but if she wanted an alternative to choose  something like unsweetened tea with lemon or other beverages that are low in sugar. Also recommended she avoid/limit artificial sweeteners  Pt asked what the best substitute would be. I recommended Stevia as a sugar substitute or a small amount of raw honey.   Pt checking BG prior to each meal and administering Fiasp as directed per sliding scale  Denies symptoms of hypoglycemia  Wilder Glade stopped in hospital on 6/17 due to acute kidney injury  Toujeo increased to 47 units at PCP appt on 6/22 and Fiasp SS added  Plan  Continue current medications   Will mail patient diet recommendations and provide meal planning book at follow up appt  Hypertension   Office blood pressures are  BP Readings from Last 3 Encounters:  09/11/19 128/76  09/08/19 Marland Kitchen)  175/70  08/13/19 (!) 150/82   Patient has failed these meds in the past: Losartan, Olmesartan/HCTZ Patient is currently controlled on the following medications:   Amlodipine 29m daily  Edarbi 860mdaily  Hydrochlorothiazide 12.28m24maily  Patient checks BP at home daily  Patient home BP readings are ranging: 126/95, 113/49  We discussed:  Pt recently obtained new BP monitor  Edarbi and HCTZ were stopped in hospital on 6/17 due to AKI, but pt resumed after discharge  Plan  Continue current medications   Hyperlipidemia   Lipid Panel     Component Value Date/Time   CHOL 165 08/13/2019 1153   TRIG 106 08/13/2019 1153   HDL 76 08/13/2019 1153   CHOLHDL 2.2 08/13/2019 1153   CHOLHDL 2.8 09/13/2007 0405   VLDL 17 09/13/2007 0405   LDLCALC 70 08/13/2019 1153     LABVLDL 19 08/13/2019 1153     The 10-year ASCVD risk score (GoMikey Bussing Jr., et al., 2013) is: 16.3%   Values used to calculate the score:     Age: [age]     Sex: Female     Is Non-Hispanic African American: Yes     Diabetic: Yes     Tobacco smoker: No     Systolic Blood Pressure: 128413Hg     Is BP treated: Yes     HDL Cholesterol: 76 mg/dL     Total Cholesterol: 165 mg/dL   Patient has failed these meds in past: N/A Patient is currently controlled on the following medications:   Rosuvastatin 40m27mily  Aspirin 81mg28mly  We discussed:    Cholesterol has improved since initial visit and is now within goal  Plan  Continue current medications  GERD   Patient has failed these meds in past: Aciphex, Zantac Patient is currently controlled on the following medications:   Pantoprazole 40mg 53my  We discussed:   Avoiding foods that trigger heartburn  Plan  Continue current medications   Anemia   Hgb: 11.3 (RR 12-15 g/dL) on 01/25/19  Patient has failed these meds in past: N/A Patient is currently controlled on the following medications:   Ferrous gluconate 324mg t128m daily with a meal  We discussed:   Denies any side effects at this time.  Plan  Continue current medications  Insomnia   Patient has failed these meds in past: N/A Patient is currently controlled on the following medications:   Temazepam 30mg at46mtime as needed  We discussed:    Pt only takes temazepam as needed (not daily)  Denies any residual drowsiness when she wakes after taking temazepam the previous night  Plan  Continue current medications  Allergies   Patient has failed these meds in past: N/A Patient is currently controlled on the following medications:   Cetirizine 10mg dai628mFluticasone nasal spray  We discussed:    Pt reports starting Zyrtec in April, she is allergic to dust  Pt made an appt with allergist for itching  Plan  Continue current  medications  Vaccines   Reviewed and discussed patient's vaccination history.    Immunization History  Administered Date(s) Administered  . DTaP 03/26/2013  . Fluad Quad(high Dose 65+) 11/08/2018  . Influenza, High Dose Seasonal PF 11/08/2018  . Influenza-Unspecified 11/30/2017  . Moderna SARS-COVID-2 Vaccination 08/03/2019, 08/31/2019  . Td 08/20/2005   Plan  Discuss vaccination at follow up appointment  Medication Management   Pt uses [facility] for all medications Uses pill box? Yes Pt endorses 100%  compliance  We discussed:   The importance of taking medications daily as directed  Packaging and delivery of medications is going well. Pt reported no issues at this time. Next delivery scheduled fr 6/24  Plan -Utilize [facility] for medication synchronization, packaging and delivery  Verbal consent obtained for [facility] enhanced pharmacy services (medication synchronization, adherence packaging, delivery coordination). A medication sync plan was created to allow patient to get all medications delivered once every 30 to 90 days per patient preference. Patient understands they have freedom to choose pharmacy and clinical pharmacist will coordinate care between all prescribers and [facility].   Follow up: 4 week office visit  Jannette Fogo, PharmD Clinical Pharmacist [facility] [phone]

## 2019-09-12 NOTE — Patient Instructions (Addendum)
Visit Information  Goals Addressed            This Visit's Progress   . Pharmacy Care Plan       CARE PLAN ENTRY  Current Barriers:  . Chronic Disease Management support, education, and care coordination needs related to Hypertension and Diabetes   Hypertension . Pharmacist Clinical Goal(s): o Over the next 45 days, patient will work with PharmD and providers to achieve BP goal <130/80 . Current regimen:  o Amlodipine 10mg  daily o Edarbi 80mg  daily o Hydrochlorothiazide 12.5mg  daily . Interventions: o Focus on healthy, balanced diet, limiting salt intake o Mailed diet recommendations o Edarbi and HCTZ held during hospitalization (6/17), but patient stated she resumed at discharged. Notified PCP that patient has been taking both medications since Sunday, 6/20.  Marland Kitchen Patient self care activities - Over the next 45 days, patient will: o Check BP daily and if symptomatic, document, and provide at future appointments o Ensure daily salt intake < 2300 mg/day o Continue exercise regimen  Diabetes . Pharmacist Clinical Goal(s): o Over the next 45 days, patient will work with PharmD and providers to achieve A1c goal <7% . Current regimen:  o Toujeo Solostar 47 units daily o Fiasp sliding scale . Interventions: o Discussed diet recommendations and meal planning in depth o Mailed patient diet recommendations and meal planning resource . Patient self care activities - Over the next 45 days, patient will: o Check blood sugar before meals, document, and provide at future appointments o Contact provider with any episodes of hypoglycemia  Medication management . Pharmacist Clinical Goal(s): o Over the next 45 days, patient will work with PharmD and providers to achieve optimal medication adherence . Current pharmacy: [facility] . Interventions o Comprehensive medication review performed. o Utilize [facility] for medication synchronization, packaging and delivery . Patient  self care activities - Over the next 45 days, patient will: o Focus on medication adherence by medication synchronization and using adherence packaging o Take medications as prescribed o Report any questions or concerns to PharmD and/or provider(s)  Please see past updates related to this goal by clicking on the "Past Updates" button in the selected goal         The patient verbalized understanding of instructions provided today and agreed to receive a mailed copy of patient instruction and/or educational materials.  Face to Face appointment with pharmacist scheduled for: 7/21 @ 3:30PM  Jannette Fogo, PharmD Clinical Pharmacist [facility] [phone]   Diabetes Mellitus and Nutrition, Adult When you have diabetes (diabetes mellitus), it is very important to have healthy eating habits because your blood sugar (glucose) levels are greatly affected by what you eat and drink. Eating healthy foods in the appropriate amounts, at about the same times every day, can help you:  Control your blood glucose.  Lower your risk of heart disease.  Improve your blood pressure.  Reach or maintain a healthy weight. Every person with diabetes is different, and each person has different needs for a meal plan. Your health care provider may recommend that you work with a diet and nutrition specialist (dietitian) to make a meal plan that is best for you. Your meal plan may vary depending on factors such as:  The calories you need.  The medicines you take.  Your weight.  Your blood glucose, blood pressure, and cholesterol levels.  Your activity level.  Other health conditions you have, such as heart or kidney disease. How do carbohydrates affect me? Carbohydrates,  also called carbs, affect your blood glucose level more than any other type of food. Eating carbs naturally raises the amount of glucose in your blood. Carb counting is a method for keeping track of how many  carbs you eat. Counting carbs is important to keep your blood glucose at a healthy level, especially if you use insulin or take certain oral diabetes medicines. It is important to know how many carbs you can safely have in each meal. This is different for every person. Your dietitian can help you calculate how many carbs you should have at each meal and for each snack. Foods that contain carbs include:  Bread, cereal, rice, pasta, and crackers.  Potatoes and corn.  Peas, beans, and lentils.  Milk and yogurt.  Fruit and juice.  Desserts, such as cakes, cookies, ice cream, and candy. How does alcohol affect me? Alcohol can cause a sudden decrease in blood glucose (hypoglycemia), especially if you use insulin or take certain oral diabetes medicines. Hypoglycemia can be a life-threatening condition. Symptoms of hypoglycemia (sleepiness, dizziness, and confusion) are similar to symptoms of having too much alcohol. If your health care provider says that alcohol is safe for you, follow these guidelines:  Limit alcohol intake to no more than 1 drink per day for nonpregnant women and 2 drinks per day for men. One drink equals 12 oz of beer, 5 oz of wine, or 1 oz of hard liquor.  Do not drink on an empty stomach.  Keep yourself hydrated with water, diet soda, or unsweetened iced tea.  Keep in mind that regular soda, juice, and other mixers may contain a lot of sugar and must be counted as carbs. What are tips for following this plan?  Reading food labels  Start by checking the serving size on the "Nutrition Facts" label of packaged foods and drinks. The amount of calories, carbs, fats, and other nutrients listed on the label is based on one serving of the item. Many items contain more than one serving per package.  Check the total grams (g) of carbs in one serving. You can calculate the number of servings of carbs in one serving by dividing the total carbs by 15. For example, if a food has 30  g of total carbs, it would be equal to 2 servings of carbs.  Check the number of grams (g) of saturated and trans fats in one serving. Choose foods that have low or no amount of these fats.  Check the number of milligrams (mg) of salt (sodium) in one serving. Most people should limit total sodium intake to less than 2,300 mg per day.  Always check the nutrition information of foods labeled as "low-fat" or "nonfat". These foods may be higher in added sugar or refined carbs and should be avoided.  Talk to your dietitian to identify your daily goals for nutrients listed on the label. Shopping  Avoid buying canned, premade, or processed foods. These foods tend to be high in fat, sodium, and added sugar.  Shop around the outside edge of the grocery store. This includes fresh fruits and vegetables, bulk grains, fresh meats, and fresh dairy. Cooking  Use low-heat cooking methods, such as baking, instead of high-heat cooking methods like deep frying.  Cook using healthy oils, such as olive, canola, or sunflower oil.  Avoid cooking with butter, cream, or high-fat meats. Meal planning  Eat meals and snacks regularly, preferably at the same times every day. Avoid going long periods of time without eating.  Eat foods high in fiber, such as fresh fruits, vegetables, beans, and whole grains. Talk to your dietitian about how many servings of carbs you can eat at each meal.  Eat 4-6 ounces (oz) of lean protein each day, such as lean meat, chicken, fish, eggs, or tofu. One oz of lean protein is equal to: ? 1 oz of meat, chicken, or fish. ? 1 egg. ?  cup of tofu.  Eat some foods each day that contain healthy fats, such as avocado, nuts, seeds, and fish. Lifestyle  Check your blood glucose regularly.  Exercise regularly as told by your health care provider. This may include: ? 150 minutes of moderate-intensity or vigorous-intensity exercise each week. This could be brisk walking, biking, or water  aerobics. ? Stretching and doing strength exercises, such as yoga or weightlifting, at least 2 times a week.  Take medicines as told by your health care provider.  Do not use any products that contain nicotine or tobacco, such as cigarettes and e-cigarettes. If you need help quitting, ask your health care provider.  Work with a Social worker or diabetes educator to identify strategies to manage stress and any emotional and social challenges. Questions to ask a health care provider  Do I need to meet with a diabetes educator?  Do I need to meet with a dietitian?  What number can I call if I have questions?  When are the best times to check my blood glucose? Where to find more information:  American Diabetes Association: diabetes.org  Academy of Nutrition and Dietetics: www.eatright.[employer] of Diabetes and Digestive and Kidney Diseases (NIH): [employer] Summary  A healthy meal plan will help you control your blood glucose and maintain a healthy lifestyle.  Working with a diet and nutrition specialist (dietitian) can help you make a meal plan that is best for you.  Keep in mind that carbohydrates (carbs) and alcohol have immediate effects on your blood glucose levels. It is important to count carbs and to use alcohol carefully. This information is not intended to replace advice given to you by your health care provider. Make sure you discuss any questions you have with your health care provider. Document Revised: 02/18/2017 Document Reviewed: 04/12/2016 Elsevier Patient Education  2020 [place] for Diabetes Mellitus, Adult  Carbohydrate counting is a method of keeping track of how many carbohydrates you eat. Eating carbohydrates naturally increases the amount of sugar (glucose) in the blood. Counting how many carbohydrates you eat helps keep your blood glucose within normal limits, which helps you manage your diabetes (diabetes  mellitus). It is important to know how many carbohydrates you can safely have in each meal. This is different for every person. A diet and nutrition specialist (registered dietitian) can help you make a meal plan and calculate how many carbohydrates you should have at each meal and snack. Carbohydrates are found in the following foods:  Grains, such as breads and cereals.  Dried beans and soy products.  Starchy vegetables, such as potatoes, peas, and corn.  Fruit and fruit juices.  Milk and yogurt.  Sweets and snack foods, such as cake, cookies, candy, chips, and soft drinks. How do I count carbohydrates? There are two ways to count carbohydrates in food. You can use either of the methods or a combination of both. Reading "Nutrition Facts" on packaged food The "Nutrition Facts" list is included on the labels of almost all packaged foods and beverages in the [place] It includes:  The  serving size.  Information about nutrients in each serving, including the grams (g) of carbohydrate per serving. To use the "Nutrition Facts":  Decide how many servings you will have.  Multiply the number of servings by the number of carbohydrates per serving.  The resulting number is the total amount of carbohydrates that you will be having. Learning standard serving sizes of other foods When you eat carbohydrate foods that are not packaged or do not include "Nutrition Facts" on the label, you need to measure the servings in order to count the amount of carbohydrates:  Measure the foods that you will eat with a food scale or measuring cup, if needed.  Decide how many standard-size servings you will eat.  Multiply the number of servings by 15. Most carbohydrate-rich foods have about 15 g of carbohydrates per serving. ? For example, if you eat 8 oz (170 g) of strawberries, you will have eaten 2 servings and 30 g of carbohydrates (2 servings x 15 g = 30 g).  For foods that have more than one food mixed,  such as soups and casseroles, you must count the carbohydrates in each food that is included. The following list contains standard serving sizes of common carbohydrate-rich foods. Each of these servings has about 15 g of carbohydrates:   hamburger bun or  English muffin.   oz (15 mL) syrup.   oz (14 g) jelly.  1 slice of bread.  1 six-inch tortilla.  3 oz (85 g) cooked rice or pasta.  4 oz (113 g) cooked dried beans.  4 oz (113 g) starchy vegetable, such as peas, corn, or potatoes.  4 oz (113 g) hot cereal.  4 oz (113 g) mashed potatoes or  of a large baked potato.  4 oz (113 g) canned or frozen fruit.  4 oz (120 mL) fruit juice.  4-6 crackers.  6 chicken nuggets.  6 oz (170 g) unsweetened dry cereal.  6 oz (170 g) plain fat-free yogurt or yogurt sweetened with artificial sweeteners.  8 oz (240 mL) milk.  8 oz (170 g) fresh fruit or one small piece of fruit.  24 oz (680 g) popped popcorn. Example of carbohydrate counting Sample meal  3 oz (85 g) chicken breast.  6 oz (170 g) brown rice.  4 oz (113 g) corn.  8 oz (240 mL) milk.  8 oz (170 g) strawberries with sugar-free whipped topping. Carbohydrate calculation 1. Identify the foods that contain carbohydrates: ? Rice. ? Corn. ? Milk. ? Strawberries. 2. Calculate how many servings you have of each food: ? 2 servings rice. ? 1 serving corn. ? 1 serving milk. ? 1 serving strawberries. 3. Multiply each number of servings by 15 g: ? 2 servings rice x 15 g = 30 g. ? 1 serving corn x 15 g = 15 g. ? 1 serving milk x 15 g = 15 g. ? 1 serving strawberries x 15 g = 15 g. 4. Add together all of the amounts to find the total grams of carbohydrates eaten: ? 30 g + 15 g + 15 g + 15 g = 75 g of carbohydrates total. Summary  Carbohydrate counting is a method of keeping track of how many carbohydrates you eat.  Eating carbohydrates naturally increases the amount of sugar (glucose) in the blood.  Counting  how many carbohydrates you eat helps keep your blood glucose within normal limits, which helps you manage your diabetes.  A diet and nutrition specialist (registered dietitian) can help  you make a meal plan and calculate how many carbohydrates you should have at each meal and snack. This information is not intended to replace advice given to you by your health care provider. Make sure you discuss any questions you have with your health care provider. Document Revised: 09/30/2016 Document Reviewed: 08/20/2015 Elsevier Patient Education  [place].

## 2019-09-13 ENCOUNTER — Other Ambulatory Visit: Payer: Self-pay

## 2019-09-13 MED ORDER — AMLODIPINE BESYLATE 10 MG PO TABS: 10.0000 mg | ORAL_TABLET | Freq: Every day | ORAL | 1 refills | Status: DC

## 2019-09-14 ENCOUNTER — Telehealth: Payer: Self-pay

## 2019-09-21 ENCOUNTER — Other Ambulatory Visit: Payer: Self-pay

## 2019-09-21 MED ORDER — PANTOPRAZOLE SODIUM 40 MG PO TBEC: DELAYED_RELEASE_TABLET | ORAL | 3 refills | Status: DC

## 2019-09-27 ENCOUNTER — Encounter: Payer: Self-pay | Admitting: Neurology

## 2019-09-27 ENCOUNTER — Telehealth: Payer: Self-pay

## 2019-09-27 ENCOUNTER — Institutional Professional Consult (permissible substitution): Payer: Medicare Other | Admitting: Neurology

## 2019-09-27 NOTE — Telephone Encounter (Signed)
Pt no showed sleep consult with Dr. Rexene Alberts today.

## 2019-10-03 ENCOUNTER — Other Ambulatory Visit: Payer: Self-pay

## 2019-10-03 DIAGNOSIS — M170 Bilateral primary osteoarthritis of knee: Secondary | ICD-10-CM | POA: Diagnosis not present

## 2019-10-03 MED ORDER — TOUJEO SOLOSTAR 300 UNIT/ML ~~LOC~~ SOPN: 47.0000 [IU] | PEN_INJECTOR | Freq: Every day | SUBCUTANEOUS | 2 refills | Status: DC

## 2019-10-06 ENCOUNTER — Emergency Department (HOSPITAL_COMMUNITY)
Admission: EM | Admit: 2019-10-06 | Discharge: 2019-10-07 | Disposition: A | Discharge status: Home / Self Care | Payer: Medicare Other | Attending: Emergency Medicine | Admitting: Emergency Medicine

## 2019-10-06 ENCOUNTER — Other Ambulatory Visit: Payer: Self-pay

## 2019-10-06 ENCOUNTER — Encounter (HOSPITAL_COMMUNITY): Payer: Self-pay | Admitting: Emergency Medicine

## 2019-10-06 DIAGNOSIS — I10 Essential (primary) hypertension: Secondary | ICD-10-CM | POA: Insufficient documentation

## 2019-10-06 DIAGNOSIS — Z79899 Other long term (current) drug therapy: Secondary | ICD-10-CM | POA: Diagnosis not present

## 2019-10-06 DIAGNOSIS — R739 Hyperglycemia, unspecified: Secondary | ICD-10-CM | POA: Diagnosis not present

## 2019-10-06 DIAGNOSIS — Z87891 Personal history of nicotine dependence: Secondary | ICD-10-CM | POA: Diagnosis not present

## 2019-10-06 DIAGNOSIS — Z800 Family history of malignant neoplasm of digestive organs: Secondary | ICD-10-CM | POA: Diagnosis not present

## 2019-10-06 DIAGNOSIS — E1165 Type 2 diabetes mellitus with hyperglycemia: Secondary | ICD-10-CM | POA: Diagnosis not present

## 2019-10-06 LAB — COMPREHENSIVE METABOLIC PANEL
ALT: 37 U/L (ref 0–44)
AST: 18 U/L (ref 15–41)
Albumin: 4.1 g/dL (ref 3.5–5.0)
Alkaline Phosphatase: 121 U/L (ref 38–126)
Anion gap: 9 (ref 5–15)
BUN: 42 mg/dL — ABNORMAL HIGH (ref 8–23)
CO2: 21 mmol/L — ABNORMAL LOW (ref 22–32)
Calcium: 9.1 mg/dL (ref 8.9–10.3)
Chloride: 96 mmol/L — ABNORMAL LOW (ref 98–111)
Creatinine, Ser: 1.52 mg/dL — ABNORMAL HIGH (ref 0.44–1.00)
GFR calc Af Amer: 41 mL/min — ABNORMAL LOW (ref >60)
GFR calc non Af Amer: 36 mL/min — ABNORMAL LOW (ref >60)
Glucose, Bld: 486 mg/dL — ABNORMAL HIGH (ref 70–99)
Potassium: 4.4 mmol/L (ref 3.5–5.1)
Sodium: 126 mmol/L — ABNORMAL LOW (ref 135–145)
Total Bilirubin: 0.6 mg/dL (ref 0.3–1.2)
Total Protein: 8.1 g/dL (ref 6.5–8.1)

## 2019-10-06 LAB — CBC
HCT: 36.5 % (ref 36.0–46.0)
Hemoglobin: 12.1 g/dL (ref 12.0–15.0)
MCH: 28.5 pg (ref 26.0–34.0)
MCHC: 33.2 g/dL (ref 30.0–36.0)
MCV: 85.9 fL (ref 80.0–100.0)
Platelets: 271 10*3/uL (ref 150–400)
RBC: 4.25 MIL/uL (ref 3.87–5.11)
RDW: 13.9 % (ref 11.5–15.5)
WBC: 9.4 10*3/uL (ref 4.0–10.5)
nRBC: 0 % (ref 0.0–0.2)

## 2019-10-06 LAB — CBG MONITORING, ED
Glucose-Capillary: 352 mg/dL — ABNORMAL HIGH (ref 70–99)
Glucose-Capillary: 441 mg/dL — ABNORMAL HIGH (ref 70–99)
Glucose-Capillary: 529 mg/dL (ref 70–99)

## 2019-10-06 LAB — BLOOD GAS, VENOUS
Acid-base deficit: 3.6 mmol/L — ABNORMAL HIGH (ref 0.0–2.0)
Bicarbonate: 21.9 mmol/L (ref 20.0–28.0)
O2 Saturation: 42.2 %
Patient temperature: 98.6
pCO2, Ven: 43.6 mmHg — ABNORMAL LOW (ref 44.0–60.0)
pH, Ven: 7.321 (ref 7.250–7.430)
pO2, Ven: <31 mmHg — CL (ref 32.0–45.0)

## 2019-10-06 LAB — BETA-HYDROXYBUTYRIC ACID: Beta-Hydroxybutyric Acid: 0.09 mmol/L (ref 0.05–0.27)

## 2019-10-06 MED ORDER — AMLODIPINE BESYLATE 5 MG PO TABS
10.0000 mg | ORAL_TABLET | Freq: Once | ORAL | Status: AC
  Administered 2019-10-07: 10 mg via ORAL
  Filled 2019-10-06: qty 2

## 2019-10-06 MED ORDER — SODIUM CHLORIDE 0.9 % IV BOLUS
1000.0000 mL | Freq: Once | INTRAVENOUS | Status: AC
  Administered 2019-10-06: 1000 mL via INTRAVENOUS

## 2019-10-06 MED ORDER — INSULIN ASPART 100 UNIT/ML ~~LOC~~ SOLN
7.0000 [IU] | Freq: Once | SUBCUTANEOUS | Status: AC
  Administered 2019-10-06: 7 [IU] via INTRAVENOUS
  Filled 2019-10-06: qty 0.07

## 2019-10-06 NOTE — ED Triage Notes (Signed)
Pt reports having cortisone shot on Wed and since then blood glucose levels have been elevated even with insulin. Pt reports feeling dizzy and gittery since then.

## 2019-10-06 NOTE — ED Provider Notes (Signed)
[facility] DEPT Provider Note   CSN: [id number] Arrival date & time: 10/06/19  1920     History Chief Complaint  Patient presents with  . Hyperglycemia    Leslie Davis is a 64 y.o. female.  HPI      Glucose has been high, received cortisone shot. This evening didn't have a reading-just said high-when did get a reading it was in 400s.  Has been high since receiving shot on Wednesday.  Feeling jittery, slight headache, feeling thirsty, jittery  Urinating more often No other changes in medication Check 3 times a day before eating, gives insulin No cough/fever/vomiting/diarrhea Mild nausea Having some mild congestion, other day had a sore throat but it didn't last long No sick contacts, is vaccinated against COVID   Past Medical History:  Diagnosis Date  . DM (diabetes mellitus) ([place])   . Fatty liver disease, nonalcoholic   . Gastritis   . Gastroparesis   . GERD (gastroesophageal reflux disease)   . Hiatal hernia   . Hiatal hernia   . HLD (hyperlipidemia)   . HTN (hypertension)   . Renal calculus     Patient Active Problem List   Diagnosis Date Noted  . AKI (acute kidney injury) ([place]) 09/07/2019  . Acute kidney injury ([place]) 09/06/2019  . Bradycardia 09/06/2019  . Hyperglycemia 09/06/2019  . HLD (hyperlipidemia)   . Hypertensive nephropathy 05/16/2019  . Erosive gastritis 11/16/2017  . Fatty liver 11/16/2017  . Abdominal pain 12/11/2012  . Early satiety 12/11/2012  . Hiatal hernia   . Hx of cholecystectomy 02/05/2011  . Flu-like symptoms 02/05/2011  . CONSTIPATION 08/27/2008  . FATTY LIVER DISEASE 05/07/2008  . Nausea with vomiting 04/23/2008  . EPIGASTRIC PAIN 04/23/2008  . RENAL CALCULUS 04/22/2008  . Type 2 diabetes mellitus with stage 2 chronic kidney disease, with long-term current use of insulin ([place]) 05/19/2006  . Type 2 diabetes mellitus with hyperlipidemia ([place]) 05/19/2006  . HYPERTENSION, BENIGN SYSTEMIC  05/19/2006  . GASTROESOPHAGEAL REFLUX, NO ESOPHAGITIS 05/19/2006  . AMENORRHEA 05/19/2006    Past Surgical History:  Procedure Laterality Date  . CHOLECYSTECTOMY    . COLONOSCOPY    . TUBAL LIGATION    . UPPER GASTROINTESTINAL ENDOSCOPY       OB History   No obstetric history on file.     Family History  Problem Relation Age of Onset  . Diabetes Mother   . Diabetes Maternal Grandmother   . Stomach cancer Maternal Aunt        X 2 aunts  . Hypertension Father   . Colon cancer Neg Hx   . Esophageal cancer Neg Hx   . Rectal cancer Neg Hx     Social History   Tobacco Use  . Smoking status: Former Smoker    Packs/day: 0.25    Years: 1.00    Pack years: 0.25    Quit date: 09/10/1974    Years since quitting: 45.1  . Smokeless tobacco: Never Used  . Tobacco comment: she no longer smokes.   Vaping Use  . Vaping Use: Never used  Substance Use Topics  . Alcohol use: No  . Drug use: No    Home Medications Prior to Admission medications   Medication Sig Start Date End Date Taking? Authorizing Provider  amLODipine (NORVASC) 10 MG tablet Take 1 tablet (10 mg total) by mouth daily. 09/13/19   [name], MD  aspirin EC 81 MG tablet Take 81 mg by mouth daily.  [provider]  Blood Glucose Monitoring Suppl (ONETOUCH VERIO) w/Device KIT Use as directed to check blood sugars 2 times per day dx: e11.65 02/13/19   [name], MD  cetirizine (ZYRTEC) 10 MG tablet Take 10 mg by mouth daily.     [provider]  EDARBI 80 MG TABS TAKE 1 TABLET EACH DAY. Patient taking differently: Take 80 mg by mouth daily after breakfast.  03/05/19   [name], MD  ferrous gluconate (FERGON) 324 MG tablet TAKE 1 TABLET BY MOUTH TWICE DAILY WITH A MEAL. 05/28/19   Mansouraty, Telford Nab., MD  glucose blood (ONETOUCH VERIO) test strip Use as instructed to check blood sugars 2 times per day dx:e11.65 09/05/19   [name], MD  insulin glargine, 1 Unit Dial, (TOUJEO  SOLOSTAR) 300 UNIT/ML Solostar Pen Inject 47 Units into the skin at bedtime. 10/03/19   [name], MD  Insulin Pen Needle 32G X 6 MM MISC Use with pen injectors daily dx code: e11.9 12/26/18   [name], MD  OneTouch Delica Lancets 37T MISC Use as directed to check blood sugars 2 times per day dx:e11.65 02/13/19   [name], MD  pantoprazole (PROTONIX) 40 MG tablet Take one tablet by mouth 3 days a week 09/21/19   [name], MD  rosuvastatin (CRESTOR) 20 MG tablet Take 1 tablet (20 mg total) by mouth daily. Patient taking differently: Take 20 mg by mouth daily. Every other day 05/14/19   [name], MD  temazepam (RESTORIL) 30 MG capsule One capsules po qhs prn Patient taking differently: Take 30 mg by mouth at bedtime as needed for sleep.  05/16/19   [name], MD    Allergies    Patient has no known allergies.  Review of Systems   Review of Systems  Constitutional: Negative for fever.  HENT: Positive for congestion. Negative for sore throat (did have but resolved).   Eyes: Negative for visual disturbance.  Respiratory: Negative for cough and shortness of breath.   Cardiovascular: Negative for chest pain.  Gastrointestinal: Positive for nausea. Negative for abdominal pain, diarrhea and vomiting.  Endocrine: Positive for polydipsia and polyuria.  Genitourinary: Negative for difficulty urinating.  Musculoskeletal: Negative for back pain and neck pain.  Skin: Negative for rash.  Neurological: Negative for syncope and headaches.    Physical Exam Updated Vital Signs BP (!) 181/71 (BP Location: Right Arm)   Pulse (!) 53   Temp 98.2 F (36.8 C) (Oral)   Resp 18   Ht '5\' 4"'$  (1.626 m)   Wt 95.3 kg   SpO2 99%   BMI 36.05 kg/m   Physical Exam Vitals and nursing note reviewed.  Constitutional:      General: She is not in acute distress.    Appearance: Normal appearance. She is not ill-appearing, toxic-appearing or diaphoretic.  HENT:     Head: Normocephalic.    Eyes:     Conjunctiva/sclera: Conjunctivae normal.  Cardiovascular:     Rate and Rhythm: Normal rate and regular rhythm.     Pulses: Normal pulses.  Pulmonary:     Effort: Pulmonary effort is normal. No respiratory distress.  Musculoskeletal:        General: No deformity or signs of injury.     Cervical back: No rigidity.  Skin:    General: Skin is warm and dry.     Coloration: Skin is not jaundiced or pale.  Neurological:     General: No focal deficit present.     Mental  Status: She is alert and oriented to person, place, and time.     ED Results / Procedures / Treatments   Labs (all labs ordered are listed, but only abnormal results are displayed) Labs Reviewed  COMPREHENSIVE METABOLIC PANEL - Abnormal; Notable for the following components:      Result Value   Sodium 126 (*)    Chloride 96 (*)    CO2 21 (*)    Glucose, Bld 486 (*)    BUN 42 (*)    Creatinine, Ser 1.52 (*)    GFR calc non Af Amer 36 (*)    GFR calc Af Amer 41 (*)    All other components within normal limits  BLOOD GAS, VENOUS - Abnormal; Notable for the following components:   pCO2, Ven 43.6 (*)    pO2, Ven <31.0 (*)    Acid-base deficit 3.6 (*)    All other components within normal limits  CBG MONITORING, ED - Abnormal; Notable for the following components:   Glucose-Capillary 529 (*)    All other components within normal limits  CBG MONITORING, ED - Abnormal; Notable for the following components:   Glucose-Capillary 441 (*)    All other components within normal limits  CBG MONITORING, ED - Abnormal; Notable for the following components:   Glucose-Capillary 352 (*)    All other components within normal limits  CBC  BETA-HYDROXYBUTYRIC ACID    EKG None  Radiology No results found.  Procedures Procedures (including critical care time)  Medications Ordered in ED Medications  sodium chloride 0.9 % bolus 1,000 mL (0 mLs Intravenous Stopped 10/06/19 2013)  insulin aspart (novoLOG)  injection 7 Units (7 Units Intravenous Given 10/06/19 2211)  sodium chloride 0.9 % bolus 1,000 mL (0 mLs Intravenous Stopped 10/07/19 0027)  amLODipine (NORVASC) tablet 10 mg (10 mg Oral Given 10/07/19 0026)    ED Course  I have reviewed the triage vital signs and the nursing notes.  Pertinent labs & imaging results that were available during my care of the patient were reviewed by me and considered in my medical decision making (see chart for details).    MDM Rules/Calculators/A&P                          65yo female with history above including htn and DM presents with concern for hyperglycemia, polyuria, polydipsia.  Symptoms began after receiving cortisone shot on Wednesday and also notes mild congestion and had some sore throat--suspect hyperglycemia secondary to viral URI and steroid shot. Labs returned without signs of DKA, presentation not consistent with HHS.  Htn but no sign of hypertensive emergency.   Given IV fluids, insulin with improvement in hyperglycemia. Recommend continued supportive care, dietary changes and discussion with PCP regarding medication changes. Patient discharged in stable condition with understanding of reasons to return.    Final Clinical Impression(s) / ED Diagnoses Final diagnoses:  Hyperglycemia    Rx / DC Orders ED Discharge Orders    None       Gareth Morgan, MD 10/07/19 1037

## 2019-10-07 NOTE — Progress Notes (Signed)
This visit occurred during the SARS-CoV-2 public health emergency.  Safety protocols were in place, including screening questions prior to the visit, additional usage of staff PPE, and extensive cleaning of exam room while observing appropriate contact time as indicated for disinfecting solutions.  Subjective:     Patient ID: Leslie Davis , female    DOB: 11/19/1955 , 64 y.o.   MRN: [id number]   Chief Complaint  Patient presents with  . Hospitalization Follow-up  . Diabetes    HPI  Admit date:  06/172021 Discharge date:09/08/2019  She presents today for hospital f/u. She is a 64 y.o.femalewith medical history significant forhypertension, insulin-dependent type 2 diabetes, CKD stage II, nonalcoholic liver disease, and hyperlipidemia who presented with concerns of weakness and fatigue.She states her sx started several days prior to admission. ER evaluation revealed AKI and hyperglycemia, therefore, she was admitted to the hospital. Of note, her medications were changed prior to her ER presentation.  She was previously on Metformin, but this was changed to [place] due to GI intolerance. Since then her blood glucose have been running in 200s and has had polyuria.  Her hospital course was uneventful. She was given iv fluids with improvement in her renal function. The dose of Toujeo was increased to 45 units nightly. She has not taken any more [place].    Diabetes She presents for her follow-up diabetic visit. She has type 2 diabetes mellitus. Her disease course has been stable. There are no hypoglycemic associated symptoms. Associated symptoms include fatigue. There are no hypoglycemic complications. Diabetic complications include nephropathy. Risk factors for coronary artery disease include diabetes mellitus, dyslipidemia, hypertension and post-menopausal. She participates in exercise intermittently. An ACE inhibitor/angiotensin II receptor blocker is being taken. Eye exam is current.      Past Medical History:  Diagnosis Date  . DM (diabetes mellitus) ([place])   . Fatty liver disease, nonalcoholic   . Gastritis   . Gastroparesis   . GERD (gastroesophageal reflux disease)   . Hiatal hernia   . Hiatal hernia   . HLD (hyperlipidemia)   . HTN (hypertension)   . Renal calculus      Family History  Problem Relation Age of Onset  . Diabetes Mother   . Diabetes Maternal Grandmother   . Stomach cancer Maternal Aunt        X 2 aunts  . Hypertension Father   . Colon cancer Neg Hx   . Esophageal cancer Neg Hx   . Rectal cancer Neg Hx      Current Outpatient Medications:  .  aspirin EC 81 MG tablet, Take 81 mg by mouth daily., Disp: , Rfl:  .  Blood Glucose Monitoring Suppl (ONETOUCH VERIO) w/Device KIT, Use as directed to check blood sugars 2 times per day dx: e11.65, Disp: 1 kit, Rfl: 1 .  cetirizine (ZYRTEC) 10 MG tablet, Take 10 mg by mouth daily. , Disp: , Rfl:  .  EDARBI 80 MG TABS, TAKE 1 TABLET EACH DAY. (Patient taking differently: Take 80 mg by mouth daily after breakfast. ), Disp: 90 tablet, Rfl: 1 .  ferrous gluconate (FERGON) 324 MG tablet, TAKE 1 TABLET BY MOUTH TWICE DAILY WITH A MEAL., Disp: 100 tablet, Rfl: 0 .  glucose blood (ONETOUCH VERIO) test strip, Use as instructed to check blood sugars 2 times per day dx:e11.65, Disp: 150 each, Rfl: 3 .  Insulin Pen Needle 32G X 6 MM MISC, Use with pen injectors daily dx code: e11.9, Disp: 10 each, Rfl: 6 .  OneTouch Delica Lancets 10F MISC, Use as directed to check blood sugars 2 times per day dx:e11.65, Disp: 150 each, Rfl: 3 .  rosuvastatin (CRESTOR) 20 MG tablet, Take 1 tablet (20 mg total) by mouth daily. (Patient taking differently: Take 20 mg by mouth daily. Every other day), Disp: 90 tablet, Rfl: 1 .  temazepam (RESTORIL) 30 MG capsule, One capsules po qhs prn (Patient taking differently: Take 30 mg by mouth at bedtime as needed for sleep. ), Disp: 30 capsule, Rfl: 1 .  amLODipine (NORVASC) 10 MG tablet,  Take 1 tablet (10 mg total) by mouth daily., Disp: 90 tablet, Rfl: 1 .  insulin glargine, 1 Unit Dial, (TOUJEO SOLOSTAR) 300 UNIT/ML Solostar Pen, Inject 47 Units into the skin at bedtime., Disp: 15 pen, Rfl: 2 .  pantoprazole (PROTONIX) 40 MG tablet, Take one tablet by mouth 3 days a week, Disp: 30 tablet, Rfl: 3   No Known Allergies   Review of Systems  Constitutional: Positive for fatigue.  Respiratory: Negative.   Cardiovascular: Negative.   Gastrointestinal: Negative.   Musculoskeletal: Positive for myalgias.  Neurological: Negative.   Psychiatric/Behavioral: Negative.      Today's Vitals   09/11/19 1511  BP: 128/76  Pulse: (!) 55  Temp: (!) 97.4 F (36.3 C)  TempSrc: Oral  Weight: 207 lb 12.8 oz (94.3 kg)  Height: '5\' 4"'$  (1.626 m)  PainSc: 4   PainLoc: Generalized   Body mass index is 35.67 kg/m.   Objective:  Physical Exam Vitals and nursing note reviewed.  Constitutional:      Appearance: Normal appearance. She is obese.  HENT:     Head: Normocephalic and atraumatic.  Cardiovascular:     Rate and Rhythm: Normal rate and regular rhythm.     Heart sounds: Normal heart sounds.  Pulmonary:     Effort: Pulmonary effort is normal.     Breath sounds: Normal breath sounds.  Skin:    General: Skin is warm.  Neurological:     General: No focal deficit present.     Mental Status: She is alert.  Psychiatric:        Mood and Affect: Mood normal.        Behavior: Behavior normal.         Assessment And Plan:     1. Type 2 diabetes mellitus with stage 2 chronic kidney disease, with long-term current use of insulin (HCC)  TCM PERFORMED. A MEMBER OF THE CLINICAL TEAM SPOKE WITH THE PATIENT UPON DISCHARGE. DISCHARGE SUMMARY WAS REVIEWED IN FULL DETAIL DURING THE VISIT. MEDS RECONCILED AND COMPARED TO DISCHARGE MEDS. MEDICATION LIST WAS UPDATED AND REVIEWED WITH THE PATIENT. GREATER THAN 50% FACE TO FACE TIME WAS SPENT IN COUNSELING AND COORDINATION OF CARE. ALL  QUESTIONS WERE ANSWERED TO THE SATISFACTION OF THE PATIENT. She was advised to increase her Toujeo to 47 units nightly. She was advised to avoid sugary beverages and to follow dietary recommendations. I will hold Wilder Glade for now. I will recheck renal function today. Unfortunately, she has not tolerated GLP-1 agonists as well.   - BMP8+EGFR   2. Acute kidney injury ([place])  I will check repeat renal function. She is encouraged to stay well hydrated.   3. Hypertensive nephropathy  Chronic, well controlled. She will continue with current meds. She is encouraged to avoid adding salt to her foods.  4. Snoring  Due to her persistent fatigue, I discussed sx associated with sleep apnea. She does have risk factors associated with OSA.  I will refer her to Neuro for sleep study evaluation. She is in agreement with her treatment plan.  - Ambulatory referral to Neurology  5. Myalgia  She is encouraged to increase her water intake. I will also check her magnesium level today. I did explain to her that patients with diabetes tend to have low intracellular magnesium levels. She was advised that she would likely benefit from supplementation.   - Magnesium     Patient was given opportunity to ask questions. Patient verbalized understanding of the plan and was able to repeat key elements of the plan. All questions were answered to their satisfactiion.   Talin Feister N. Baird Cancer, MD   THE PATIENT IS ENCOURAGED TO PRACTICE SOCIAL DISTANCING DUE TO THE COVID-19 PANDEMIC.

## 2019-10-08 ENCOUNTER — Telehealth: Payer: Self-pay

## 2019-10-08 NOTE — Telephone Encounter (Signed)
Please pull sliding scale regimen she is currently using so I can adjust appropriately. thx

## 2019-10-08 NOTE — Telephone Encounter (Signed)
The pt was told that Dr Baird Cancer wants her to increase her Toujeo to 50 units at bedtime and to call with her fasting blood sugars on Tuesdays and Thursdays the pt said that she has an appt this Wednesday and will bring her readings.  Her reading this morning was 383 and that she also takes fiasp insulin and that she is on the sliding scale.

## 2019-10-10 ENCOUNTER — Encounter: Payer: Self-pay | Admitting: Internal Medicine

## 2019-10-10 ENCOUNTER — Telehealth: Payer: Medicare Other

## 2019-10-10 ENCOUNTER — Ambulatory Visit: Payer: Self-pay

## 2019-10-10 ENCOUNTER — Ambulatory Visit (INDEPENDENT_AMBULATORY_CARE_PROVIDER_SITE_OTHER): Payer: Medicare Other

## 2019-10-10 ENCOUNTER — Other Ambulatory Visit: Payer: Self-pay

## 2019-10-10 ENCOUNTER — Ambulatory Visit (INDEPENDENT_AMBULATORY_CARE_PROVIDER_SITE_OTHER): Payer: Medicare Other | Admitting: Internal Medicine

## 2019-10-10 VITALS — BP 120/60 | HR 60 | Temp 98.3°F | Ht 63.4 in | Wt 209.6 lb

## 2019-10-10 DIAGNOSIS — N182 Chronic kidney disease, stage 2 (mild): Secondary | ICD-10-CM | POA: Diagnosis not present

## 2019-10-10 DIAGNOSIS — N179 Acute kidney failure, unspecified: Secondary | ICD-10-CM | POA: Diagnosis not present

## 2019-10-10 DIAGNOSIS — M791 Myalgia, unspecified site: Secondary | ICD-10-CM

## 2019-10-10 DIAGNOSIS — Z794 Long term (current) use of insulin: Secondary | ICD-10-CM

## 2019-10-10 DIAGNOSIS — I10 Essential (primary) hypertension: Secondary | ICD-10-CM | POA: Diagnosis not present

## 2019-10-10 DIAGNOSIS — E1122 Type 2 diabetes mellitus with diabetic chronic kidney disease: Secondary | ICD-10-CM

## 2019-10-10 NOTE — Patient Instructions (Signed)
DIABETIC SLIDING SCALE (II)  IF BLOOD SUGAR IS:  LESS THAN 100 (NO INSULIN)  100-140 (3 UNITS)  141-180 (6 UNITS)  181-220 (9 UNITS)  221-260 (12 UNITS)  261-300 (15 UNITS)  301-340 (18 UNITS)  MORE THAN 341 (21 UNITS) 

## 2019-10-10 NOTE — Chronic Care Management (AMB) (Signed)
Chronic Care Management Pharmacy  Name: HANADI STANLY  MRN: [id number] DOB: 30-Jun-1955  Chief Complaint/ HPI  [name],  64 y.o. , female presents for their Follow-Up CCM visit with the clinical pharmacist In office.  PCP : [name], MD  Their chronic conditions include: Hypertension, Diabetes with CKD, Hyperlipidemia, GERD, Insomnia  Office Visits: 10/08/19 Telephone call: Pt instructed to increase Toujeo to 50 units at bedtime and call with [facility] on Tuesdays and Thursdays. Appt scheduled for Wednesday.   09/11/19 OV: Toujeo increased to 47 units daily and Fiasp started per sliding scale. Stop Wilder Glade due to acute kidney injury.   08/13/19 OV: Presented for diabetes check. Pt reported that she would like to stop metformin because of side effects (diarrhea). Metformin discontinued and started [place] '10mg'$  daily. Pt advised to stay well hydrated and follow up in 4 weeks. Labs ordered (HgbA1c, BMP8+EGFR, Lipid panel). Will consider discontinuing HCTZ at next visit.   05/16/19 OV: Presented for DM and HTN follow up. Pt complained of GI issues with metformin. Advised to change metformin dose to once daily. Labs ordered (BMP8+EGFR, HgbA1c, ANA, IFA w/ reflex, TSH, Testosterone total). Referred to Dermatology for evaluation of hair thinning. Dietary and exercise recommendations provided. Exercise and weight loss encouraged.   02/08/19 OV: Presented for routine wellness examination. Labs ordered (Hgb A1c, lipid panel). EKG done with no new changes noted.   Consult Visits: 10/06/19 ED admission: Elevated BG since having cortisone shots. Most recent reading was in to 400s. Also complained of polyuria and polydipsia. Labs did not show signs of DKA. Presentation not consistent with HHS. No sign of hypertensive emergency. Given fluids and insulin and hyperglycemia improved. Recommend supportive care, dietary changes and discussion with PCP about medication changes.   10/03/19 Orthopedic  OV w/ C. Robbins: Kenalog injections in both knees for progressing mild to moderate tricompartmental degenerative changes. Discussed activity modification, ice, and anti-inflammatories.   09/27/19 No show for sleep study  09/27/19 Orthopedic surgery telephone call: Pt notified she could not be prescribed pain medications due to not being seen in 2 months and missing last post-op visit. Advised pt to schedule appt if she feels like she needs to be seen. Recommend OTC pain medications (Tylenol, ibuprofen, or naproxen).   09/06/19-09/08/19 ED admission: Presented for evaluation of generalized fatigue and weakness occurring for the last 2 weeks. Pt reported symptoms are getting worse. Some generalized muscle spasms also noted. Mild dyspnea present. Headache present but no vision changes. Pt also noted BG has been running higher than normal. Creatinine significantly elevated compared to 5/24. Mild hyponatremia. Glucose elevated without evidence of ketoacidosis. ECG showed nonspecific T wave changes (new compared to 2020). Acute kidney injury possibly due to new medication, Farxiga. Hold HCTZ and ARB due to AKI.   07/26/19 Orthopedic surgery OV w/ Dr. Clance Boll and PT: Presented for follow up 5 weeks post left knee arthroscopy w/ partial medial menisectomy. Pt reported pain she experienced before surgery is gone. Physical therapy goals completed. Pt to continue home exercise program. Follow up in 6 weeks.   06/26/19 Sports Medicine OV w/ C. Robbins: Presented for follow up 1 week post knee arthroscopy  and partial menisectomy and reported doing well. Pt to progress to outpatient physical therapy. Follow up in 4 weeks.    06/20/19: Left knee arthroscopy and partial medial menisectomy performed by C. Unk Lightning  05/23/19 Sports Medicine OV w/ C. Robbins: Presented for second opinion for chronic left knee pain. Past treatments have  included cortisone injections and gel lubrication injection. Pt advised to wait 2 more weeks to  determine if gel lubrication injection provides any symptom resolution. If no relief, pt will have knee scope for partial meniscectomy.   05/08/19 Orthopedic OV w. Dr. Case: Presented for left knee Monovisc injection for left knee osteoarthritis  05/07/19 ED visit: Presented for injury to the right shoulder that occurred yesterday while performing weight assisted pull ups. Pt reported moderate to severe, throbbing pain. Follow up with orthopedic. Pt given sling for comfort.   05/02/19 Orthopedic OV w. Dr. Case: Presented for left knee Monovisc injection for left knee osteoarthritis  02/06/19 Physical Therapy for generalized muscle weakness, chronic left knee pain, joint stiffness, and difficulty walking (2x weekly)  CCM Encounters: 08/28/19 SW: Contacted pt to assist with obtaining BP meter, but pt stated she had already gotten one.   08/14/19 RN: Review of diabetes treatment regimen and discussed appropriate goals. Evaluated current treatment plan for hypertriglyceridemia. Pt education provided.   07/18/19 RN: Reviewed current treatment plans relating to acute and chronic conditions. Provided patient education.   06/11/19 PharmD: Comprehensive medication review performed. Toujeo increased to 34 units daily. Dietary and exercise recommendations provided.   03/30/19 RN: Evaluation of current treatment plans and patient education relating to disease states, diet, and exercise.   02/19/19 PharmD: Comprehensive medication review performed  02/12/19 RN: Evaluation of current treatment plans and patient education relating to disease states, diet, and exercise. Collaboration with PharmD regarding medications.  Medications: Outpatient Encounter Medications as of 10/10/2019  Medication Sig  . amLODipine (NORVASC) 10 MG tablet Take 1 tablet (10 mg total) by mouth daily.  Marland Kitchen aspirin EC 81 MG tablet Take 81 mg by mouth daily.   . Blood Glucose Monitoring Suppl (ONETOUCH VERIO) w/Device KIT Use as directed to  check blood sugars 2 times per day dx: e11.65  . cetirizine (ZYRTEC) 10 MG tablet Take 10 mg by mouth daily.   Marland Kitchen EDARBI 80 MG TABS TAKE 1 TABLET EACH DAY.  . ferrous gluconate (FERGON) 324 MG tablet TAKE 1 TABLET BY MOUTH TWICE DAILY WITH A MEAL.  Marland Kitchen FIASP FLEXTOUCH 100 UNIT/ML FlexTouch Pen Inject into the skin. Sliding scale insulin   . glucose blood (ONETOUCH VERIO) test strip Use as instructed to check blood sugars 2 times per day dx:e11.65  . insulin glargine, 1 Unit Dial, (TOUJEO SOLOSTAR) 300 UNIT/ML Solostar Pen Inject 47 Units into the skin at bedtime.  . Insulin Pen Needle 32G X 6 MM MISC Use with pen injectors daily dx code: e11.9  . OneTouch Delica Lancets 29B MISC Use as directed to check blood sugars 2 times per day dx:e11.65  . pantoprazole (PROTONIX) 40 MG tablet Take one tablet by mouth 3 days a week  . rosuvastatin (CRESTOR) 20 MG tablet Take 1 tablet (20 mg total) by mouth daily.  . temazepam (RESTORIL) 30 MG capsule One capsules po qhs prn   No facility-administered encounter medications on file as of 10/10/2019.     Current Diagnosis/Assessment:  Goals Addressed   None     Diabetes   Recent Relevant Labs: Lab Results  Component Value Date/Time   HGBA1C 9.6 (H) 08/13/2019 11:53 AM   HGBA1C 8.4 (H) 05/16/2019 03:08 PM    Kidney Function Lab Results  Component Value Date/Time   CREATININE 1.52 (H) 10/06/2019 08:12 PM   CREATININE 1.42 (H) 09/11/2019 03:58 PM   CREATININE 0.74 08/30/2011 01:07 PM   GFR 77.09 11/17/2017 09:00 AM  GFRNONAA 36 (L) 10/06/2019 08:12 PM   GFRAA 41 (L) 10/06/2019 08:12 PM  Stage 3a CKD  Checking BG: Before meals  Recent FBG Readings: 209,194,174,450,534,481,397,383,399,362 Recent pre-meal BG readings:214,443,428,381,170,240,520,351,556 Patient has failed these meds in past: Ozempic (stopped d/t SE), Tresiba, Levemir, Novolog 70/30, Januvia, Farxiga (AKI) Patient is currently uncontrolled on the following medications:    Toujeo Solostar 50 units daily  Fiasp sliding scale  Last diabetic Foot exam: 11/08/18 Last diabetic Eye exam: 11/03/18 Lab Results  Component Value Date/Time   HMDIABEYEEXA No Retinopathy 08/02/2019 12:00 AM     We discussed:   Pt recently hospitalized due to elevated blood glucose readings after receiving cortisone shots in her knees  Wilder Glade was tried recently but stopped due to acute kidney injury  Diet extensively  Pt states that she mostly eats a meat and a vegetable for meals (string beans, spinach, etc)  Discussed that patient needs to eat well balanced meals and try to limit the amount of carbohydrates she is eating  Provided pt with carbohydrate counting and meal planning books to assist with diet  Encouraged pt to try to eat around 50 grams of carbohydrates with each meal (breakfast, lunch, and dinner)  Pt has been using 10 units of Fiasp before every meal due to BG over 350  Toujeo dose was increased yesterday to 50 units daily  Discussed starting Ozempic with patient (after discussion with PCP)  Pt has taken in the past and reported side effects. She thinks the side effects started when the dose was increased  Pt stated that she was willing to try Ozempic again  Advised pt to start using Ozempic 0.'25mg'$  once weekly  Pt feels comfortable with how to use Ozempic pen  Provided pt with a sample of Ozempic  Fiasp sliding scale  Gave pt directions to increase sliding scale (was previously using 2,4,6, etc units)  LESS THAN 100 (NO INSULIN)  100-140 (3 UNITS)  141-180 (6 UNITS)  181-220 (9 UNITS)  221-260 (12 UNITS)  261-300 (15 UNITS)  301-340 (18 UNITS)   MORE THAN 341 (21 UNITS)  Pt is to use sliding scale with breakfast and dinner  Pt voiced understanding of new instructions  Pt states she has been staying hydrated and drinking 64 ounces of water daily  Plan Continue current medications  Start Ozempic 0.'25mg'$  once weekly Increase Fiasp  sliding scale as directed Follow up on blood sugar readings and adjust insulin as needed  Hypertension   Office blood pressures are  BP Readings from Last 3 Encounters:  10/10/19 120/60  10/07/19 (!) 181/71  09/11/19 128/76   Patient has failed these meds in the past: Losartan, Olmesartan/HCTZ Patient is currently controlled on the following medications:   Amlodipine '10mg'$  daily  Edarbi '80mg'$  daily  Hydrochlorothiazide 12.'5mg'$  daily  Patient checks BP at home daily  Patient home BP readings are ranging: None provided today  Plan Continue current medications   Hyperlipidemia   Lipid Panel     Component Value Date/Time   CHOL 165 08/13/2019 1153   TRIG 106 08/13/2019 1153   HDL 76 08/13/2019 1153   CHOLHDL 2.2 08/13/2019 1153   CHOLHDL 2.8 09/13/2007 0405   VLDL 17 09/13/2007 0405   LDLCALC 70 08/13/2019 1153   LABVLDL 19 08/13/2019 1153    The 10-year ASCVD risk score Mikey Bussing DC Jr., et al., 2013) is: 14.1%   Values used to calculate the score:     Age: 10 years     Sex: Female  Is Non-Hispanic African American: Yes     Diabetic: Yes     Tobacco smoker: No     Systolic Blood Pressure: 826 mmHg     Is BP treated: Yes     HDL Cholesterol: 76 mg/dL     Total Cholesterol: 165 mg/dL   Patient has failed these meds in past: N/A Patient is currently controlled on the following medications:   Rosuvastatin '20mg'$  daily  Aspirin '81mg'$  daily  Plan  Continue current medications  GERD   Patient has failed these meds in past: Aciphex, Zantac Patient is currently controlled on the following medications:   Pantoprazole '40mg'$  daily  Plan  Continue current medications   Anemia   Hgb: 11.3 (RR 12-15 g/dL) on 01/25/19  Patient has failed these meds in past: N/A Patient is currently controlled on the following medications:   Ferrous gluconate '324mg'$  twice daily with a meal  Plan  Continue current medications  Insomnia   Patient has failed these meds in past:  N/A Patient is currently controlled on the following medications:   Temazepam '30mg'$  at bedtime as needed  Plan  Continue current medications  Allergies   Patient has failed these meds in past: N/A Patient is currently controlled on the following medications:   Cetirizine '10mg'$  daily  Fluticasone nasal spray  Plan  Continue current medications  Vaccines   Reviewed and discussed patient's vaccination history.    Immunization History  Administered Date(s) Administered  . DTaP 03/26/2013  . Fluad Quad(high Dose 65+) 11/08/2018  . Influenza, High Dose Seasonal PF 11/08/2018  . Influenza-Unspecified 11/30/2017  . Moderna SARS-COVID-2 Vaccination 08/03/2019, 08/31/2019  . Td 08/20/2005   Plan  Discuss vaccination at follow up appointment  Medication Management   Pt uses [facility] for all medications Uses pill box? Yes Pt endorses 100% compliance  We discussed:   The importance of taking medications daily as directed  Packaging and delivery of medications is going well. Pt reported no issues at this time. Next delivery scheduled for August  Plan -Utilize [facility] pharmacy for medication synchronization, packaging and delivery  Follow up: 4 week phone visit  Jannette Fogo, PharmD Clinical Pharmacist [facility] [phone]

## 2019-10-10 NOTE — Progress Notes (Signed)
This visit occurred during the SARS-CoV-2 public health emergency.  Safety protocols were in place, including screening questions prior to the visit, additional usage of staff PPE, and extensive cleaning of exam room while observing appropriate contact time as indicated for disinfecting solutions.  Subjective:     Patient ID: Leslie Davis , female    DOB: Dec 03, 1955 , 64 y.o.   MRN: [id number]   Chief Complaint  Patient presents with  . Follow-up    HPI  She presents today for ER f/u. She presented to ER on 7/17 with concern for hyperglycemia, polyuria, and polydipsia.  Symptoms began after receiving cortisone shot on Wednesday prior to presentation. She also c/o mild congestion and had some sore throat--suspect hyperglycemia secondary to viral URI and steroid shot. Workup included labwork, without signs of DKA.  She was also found to be hypertensive,  but no sign of hypertensive emergency.   Given IV fluids, insulin with improvement in hyperglycemia. Recommend continued supportive care, dietary changes and advised to f/u with PCP for medication mgmt. Patient discharged in stable condition with understanding of reasons to return.  She has felt better since her discharge. She reports her sugars are trending downward.     Past Medical History:  Diagnosis Date  . DM (diabetes mellitus) ([place])   . Fatty liver disease, nonalcoholic   . Gastritis   . Gastroparesis   . GERD (gastroesophageal reflux disease)   . Hiatal hernia   . Hiatal hernia   . HLD (hyperlipidemia)   . HTN (hypertension)   . Renal calculus      Family History  Problem Relation Age of Onset  . Diabetes Mother   . Diabetes Maternal Grandmother   . Stomach cancer Maternal Aunt        X 2 aunts  . Hypertension Father   . Colon cancer Neg Hx   . Esophageal cancer Neg Hx   . Rectal cancer Neg Hx      Current Outpatient Medications:  .  amLODipine (NORVASC) 10 MG tablet, Take 1 tablet (10 mg total) by mouth  daily., Disp: 90 tablet, Rfl: 1 .  aspirin EC 81 MG tablet, Take 81 mg by mouth daily. , Disp: , Rfl:  .  Blood Glucose Monitoring Suppl (ONETOUCH VERIO) w/Device KIT, Use as directed to check blood sugars 2 times per day dx: e11.65, Disp: 1 kit, Rfl: 1 .  cetirizine (ZYRTEC) 10 MG tablet, Take 10 mg by mouth daily. , Disp: , Rfl:  .  EDARBI 80 MG TABS, TAKE 1 TABLET EACH DAY., Disp: 90 tablet, Rfl: 1 .  ferrous gluconate (FERGON) 324 MG tablet, TAKE 1 TABLET BY MOUTH TWICE DAILY WITH A MEAL., Disp: 100 tablet, Rfl: 0 .  glucose blood (ONETOUCH VERIO) test strip, Use as instructed to check blood sugars 2 times per day dx:e11.65, Disp: 150 each, Rfl: 3 .  OneTouch Delica Lancets 40J MISC, Use as directed to check blood sugars 2 times per day dx:e11.65, Disp: 150 each, Rfl: 3 .  pantoprazole (PROTONIX) 40 MG tablet, Take one tablet by mouth 3 days a week, Disp: 30 tablet, Rfl: 3 .  rosuvastatin (CRESTOR) 20 MG tablet, Take 1 tablet (20 mg total) by mouth daily., Disp: 90 tablet, Rfl: 1 .  Cholecalciferol (VITAMIN D3) 1.25 MG (50000 UT) CAPS, Take 1 capsule by mouth on Tuesday and Friday, Disp: 24 capsule, Rfl: 1 .  Continuous Blood Gluc Receiver (FREESTYLE LIBRE 14 DAY READER) DEVI, by Does not apply route.,  Disp: , Rfl:  .  Continuous Blood Gluc Sensor (FREESTYLE LIBRE 14 DAY SENSOR) MISC, Use as directed to check blood sugars 4 times per day dx: e11.65, Disp: 2 each, Rfl: 4 .  insulin glargine, 1 Unit Dial, (TOUJEO SOLOSTAR) 300 UNIT/ML Solostar Pen, Inject 50 units subcutaneous at bedtime, Disp: 22.5 mL, Rfl: 1 .  insulin lispro (HUMALOG KWIKPEN) 100 UNIT/ML KwikPen, Sliding scale, Disp: 15 mL, Rfl: 2 .  Insulin Pen Needle 32G X 6 MM MISC, Use with pen injectors daily dx code: e11.9, Disp: 150 each, Rfl: 3 .  temazepam (RESTORIL) 30 MG capsule, One capsules po qhs prn, Disp: 60 capsule, Rfl: 0   Allergies  Allergen Reactions  . Fiasp [Insulin Aspart (W-Niacinamide)] Hives     Review of  Systems  Constitutional: Negative.   Respiratory: Negative.   Cardiovascular: Negative.   Gastrointestinal: Negative.   Musculoskeletal: Positive for myalgias.  Neurological: Negative.   Psychiatric/Behavioral: Negative.      Today's Vitals   10/10/19 1518  BP: 120/60  Pulse: 60  Temp: 98.3 F (36.8 C)  TempSrc: Oral  Weight: 209 lb 9.6 oz (95.1 kg)  Height: 5' 3.4" (1.61 m)   Body mass index is 36.66 kg/m.   Objective:  Physical Exam Vitals and nursing note reviewed.  Constitutional:      Appearance: Normal appearance. She is obese.  HENT:     Head: Normocephalic and atraumatic.  Cardiovascular:     Rate and Rhythm: Normal rate and regular rhythm.     Pulses:          Dorsalis pedis pulses are 2+ on the right side and 2+ on the left side.     Heart sounds: Normal heart sounds.  Pulmonary:     Effort: Pulmonary effort is normal.     Breath sounds: Normal breath sounds.  Feet:     Right foot:     Protective Sensation: 5 sites tested. 5 sites sensed.     Skin integrity: Callus and dry skin present.     Toenail Condition: Right toenails are normal.     Left foot:     Protective Sensation: 5 sites tested. 5 sites sensed.     Skin integrity: Callus and dry skin present.     Toenail Condition: Left toenails are normal.  Skin:    General: Skin is warm.  Neurological:     General: No focal deficit present.     Mental Status: She is alert.  Psychiatric:        Mood and Affect: Mood normal.        Behavior: Behavior normal.         Assessment And Plan:     1. Type 2 diabetes mellitus with stage 2 chronic kidney disease, with long-term current use of insulin ([name]) Comments: ER records reviewed in full detail.  Diabetic foot exam was performed. I will increase Toujoe to 47 units nightly. Advised to call in sugars weekly so her insulin dose can be adjusted accordingly.   2. Myalgia Comments: Possibly due to statin use.  Advised to stay well hydrated. She may also  benefit from magnesium supplementation.      Patient was given opportunity to ask questions. Patient verbalized understanding of the plan and was able to repeat key elements of the plan. All questions were answered to their satisfaction.  [name], MD   I, [name], MD, have reviewed all documentation for this visit. The documentation on 12/02/19  for the exam, diagnosis, procedures, and orders are all accurate and complete.  THE PATIENT IS ENCOURAGED TO PRACTICE SOCIAL DISTANCING DUE TO THE COVID-19 PANDEMIC.

## 2019-10-12 ENCOUNTER — Telehealth: Payer: Self-pay

## 2019-10-12 NOTE — Chronic Care Management (AMB) (Signed)
Chronic Care Management   Follow Up Note   10/11/2019 Name: Leslie Davis MRN: [id number] DOB: Jan 11, 1956  Referred by: [name], MD Reason for referral : Chronic Care Management (FU RN CM Call - compression stockings )   Leslie Davis is a 64 y.o. year old female who is a primary care patient of [name], MD. The CCM team was consulted for assistance with chronic disease management and care coordination needs.    Review of patient status, including review of consultants reports, relevant laboratory and other test results, and collaboration with appropriate care team members and the patient's provider was performed as part of comprehensive patient evaluation and provision of chronic care management services.    SDOH (Social Determinants of Health) assessments performed: Yes - no acute challenges noted  See Care Plan activities for detailed interventions related to [place])   Placed outbound call to patient for a CCM RN CM diabetes update.     Outpatient Encounter Medications as of 10/10/2019  Medication Sig  . amLODipine (NORVASC) 10 MG tablet Take 1 tablet (10 mg total) by mouth daily.  Marland Kitchen aspirin EC 81 MG tablet Take 81 mg by mouth daily.   . Blood Glucose Monitoring Suppl (ONETOUCH VERIO) w/Device KIT Use as directed to check blood sugars 2 times per day dx: e11.65  . cetirizine (ZYRTEC) 10 MG tablet Take 10 mg by mouth daily.   Marland Kitchen EDARBI 80 MG TABS TAKE 1 TABLET EACH DAY.  . ferrous gluconate (FERGON) 324 MG tablet TAKE 1 TABLET BY MOUTH TWICE DAILY WITH A MEAL.  Marland Kitchen FIASP FLEXTOUCH 100 UNIT/ML FlexTouch Pen Inject into the skin. Sliding scale insulin   . glucose blood (ONETOUCH VERIO) test strip Use as instructed to check blood sugars 2 times per day dx:e11.65  . insulin glargine, 1 Unit Dial, (TOUJEO SOLOSTAR) 300 UNIT/ML Solostar Pen Inject 47 Units into the skin at bedtime.  . Insulin Pen Needle 32G X 6 MM MISC Use with pen injectors daily dx code: e11.9  .  OneTouch Delica Lancets 79T MISC Use as directed to check blood sugars 2 times per day dx:e11.65  . pantoprazole (PROTONIX) 40 MG tablet Take one tablet by mouth 3 days a week  . rosuvastatin (CRESTOR) 20 MG tablet Take 1 tablet (20 mg total) by mouth daily.  . temazepam (RESTORIL) 30 MG capsule One capsules po qhs prn   No facility-administered encounter medications on file as of 10/10/2019.     Objective:  Lab Results  Component Value Date   HGBA1C 9.6 (H) 08/13/2019   HGBA1C 8.4 (H) 05/16/2019   HGBA1C 8.2 (H) 02/08/2019   Lab Results  Component Value Date   LDLCALC 70 08/13/2019   CREATININE 1.52 (H) 10/06/2019   BP Readings from Last 3 Encounters:  10/10/19 120/60  10/07/19 (!) 181/71  09/11/19 128/76    Goals Addressed      Patient Stated   .  "I would like to better manage my Diabetes" (pt-stated)   Not on track     Current Barriers:  Marland Kitchen Knowledge Deficits related to diabetes disease process and Self Health Management  . Chronic Disease Management support and education needs related to Type 2 diabetes mellitus, Hypertensive nephropathy, Chronic renal failure  Nurse Case Manager Clinical Goal(s):  Marland Kitchen Over the next 90 days, patient will work with the CCM team to address needs related to education and support to improve Self management of DM . New - 02/12/19 - Over the next 90  days, patient will lower her A1C <8.2 Goal Not Met . New - 07/19/19 Over the next 90 days, patient will work with embedded CCM team on disease education and support to improve Self Health management of DM to help lower A1c <7.0 %  CCM RN CM Interventions:  10/11/19 call completed with patient  . Determined patient received a Cortisol injection to her L knee increasing her blood glucose levels . Determined patient was seen in the ED at [facility] on 10/06/19 with dx: Hyperglycemia, blood sugar noted to be 486 . Reviewed and discussed patient's f/u with Dr. Baird Cancer on 10/10/19; Determined patient has a  good understanding of DM recommendations provided including adherence to the Sliding Scale provided  . Stressed importance of patient monitoring her FBS as directed and keeping Dr. Baird Cancer and the CCM team well informed of all hyper and or  hypoglycemic episodes  . Discussed plans with patient for ongoing care management follow up and provided patient with direct contact information for care management team  Patient Self Care Activities:  . Self administers medications as prescribed . Attends all scheduled provider appointments . Calls pharmacy for medication refills . Attends church or other social activities . Performs ADL's independently . Performs IADL's independently . Calls provider office for new concerns or questions  Please see past updates related to this goal by clicking on the "Past Updates" button in the selected goal        Plan:   Telephone follow up appointment with care management team member scheduled for: 11/30/19  Barb Merino, RN, BSN, CCM Care Management Coordinator [place] [facility]  Direct Phone: [phone]

## 2019-10-12 NOTE — Patient Instructions (Signed)
Visit Information  Goals Addressed      Patient Stated   .  "I would like to better manage my Diabetes" (pt-stated)   Not on track     Current Barriers:  Marland Kitchen Knowledge Deficits related to diabetes disease process and Self Health Management  . Chronic Disease Management support and education needs related to Type 2 diabetes mellitus, Hypertensive nephropathy, Chronic renal failure  Nurse Case Manager Clinical Goal(s):  Marland Kitchen Over the next 90 days, patient will work with the CCM team to address needs related to education and support to improve Self management of DM . New - 02/12/19 - Over the next 90 days, patient will lower her A1C <8.2 Goal Not Met . New - 07/19/19 Over the next 90 days, patient will work with embedded CCM team on disease education and support to improve Self Health management of DM to help lower A1c <7.0 %  CCM RN CM Interventions:  10/11/19 call completed with patient  . Determined patient received a Cortisol injection to her L knee increasing her blood glucose levels . Determined patient was seen in the ED at [facility] on 10/06/19 with dx: Hyperglycemia, blood sugar noted to be 486 . Reviewed and discussed patient's f/u with Dr. Baird Cancer on 10/10/19; Determined patient has a good understanding of DM recommendations provided including adherence to the Sliding Scale provided  . Stressed importance of patient monitoring her FBS as directed and keeping Dr. Baird Cancer and the CCM team well informed of all hyper and or  hypoglycemic episodes  . Discussed plans with patient for ongoing care management follow up and provided patient with direct contact information for care management team  Patient Self Care Activities:  . Self administers medications as prescribed . Attends all scheduled provider appointments . Calls pharmacy for medication refills . Attends church or other social activities . Performs ADL's independently . Performs IADL's independently . Calls provider office for new  concerns or questions  Please see past updates related to this goal by clicking on the "Past Updates" button in the selected goal        Patient verbalizes understanding of instructions provided today.   Telephone follow up appointment with care management team member scheduled for: 11/30/19  Barb Merino, RN, BSN, CCM Care Management Coordinator [place] [facility]  Direct Phone: [phone]

## 2019-10-15 ENCOUNTER — Other Ambulatory Visit: Payer: Self-pay

## 2019-10-15 NOTE — Telephone Encounter (Signed)
The pt called and said that the fiasp insulin has her broken out with hives.  The pt was told to stop the medication and to come pickup a sample of humalog that Dr sanders is going to switch it with.

## 2019-10-16 NOTE — Progress Notes (Addendum)
3 day follow up call. Spoke with Leslie Davis, She took Ozempic Wednesday night, checked BS Thursday morning before breakfast it was at 235, she did not record BS yesterday evening. She had a hamburger with lettuce and tomato with some macaroni and cheese for dinner. This morning BS before breakfast was 214, she had lunch and after lunch BS was at 520, patient takes Fiasp at dinner and per sliding scale she should take 21u at dinner. Patient advised to check her BS 2 hours after dinner and record reading. Patient also advised to continue to check BS 2-3 times a day and to record. Patient aware that I will communicate these numbers to pharmacist, Jannette Fogo, and we will check on her next week.

## 2019-10-17 ENCOUNTER — Other Ambulatory Visit: Payer: Self-pay

## 2019-10-18 ENCOUNTER — Telehealth: Payer: Self-pay

## 2019-10-18 MED ORDER — FREESTYLE LIBRE 14 DAY SENSOR MISC: 4 refills | Status: DC

## 2019-10-18 NOTE — Telephone Encounter (Signed)
Prescription for [facility] faxed to the pharmacy.

## 2019-10-23 ENCOUNTER — Telehealth: Payer: Self-pay

## 2019-10-23 NOTE — Telephone Encounter (Signed)
  Chronic Care Management   Outreach Note  10/23/2019 Name: Leslie Davis MRN: [id number] DOB: 25-Sep-1955  Referred by: [name], MD Reason for referral : No chief complaint on file.   Received voice message from patient requesting a return call. She stated she has a medication question. The patient was referred to the case management team for assistance with care management and care coordination and is also engaged with embedded Dola Factor RPH. Sent in basket message to [place] requesting she follow up with [name] per her request regarding a medication question.   Follow Up Plan: Telephone follow up appointment with care management team member scheduled for: 11/30/19  Barb Merino, RN, BSN, CCM Care Management Coordinator [place] [facility]  Direct Phone: [phone]

## 2019-10-26 ENCOUNTER — Telehealth: Payer: Self-pay

## 2019-10-26 NOTE — Chronic Care Management (AMB) (Addendum)
Chronic Care Management Pharmacy Assistant   Name: Leslie Davis  MRN: [id number] DOB: 1955/05/05  Reason for Encounter: Medication Review- Medication Dispensing Call/ Diabetes Adherence Call. 10/26/2019- Left message to return call. Tm 10/29/2019- Left message to return call. Tm 10/30/2019- Left message to return call. Left message on son's number also, son returned call and will have mother call today regarding medication delivery.   PCP : [name], MD   Patient Questions:  1.  Have you seen any other providers since your last visit? Yes, 7/21/2021Barb Merino, RN- Nurse Case manager and Jannette Fogo, CPP-Pharmacist  2.  Any changes in your medicines or health? Yes, Ozempic and Fiasp were added on 10/10/2019. Claiborne [place] was changed to Humalog on 10/15/19 due to potential allergic reaction to [place].   Reviewed chart for medication changes ahead of medication coordination call.  ED admission on 10/06/19 due to hyperglycemia. No medications since coordination call/Pharmacist visit on 10/10/2019.   BP Readings from Last 3 Encounters:  10/10/19 120/60  10/07/19 (!) 181/71  09/11/19 128/76    Lab Results  Component Value Date   HGBA1C 9.6 (H) 08/13/2019     Patient obtains medications through Adherence Packaging  90 Days and Vials 30 Days  Last adherence delivery included:  Clindamycin Lotion 1%, apply 1 application to affected area Toujeo Solo inj 300 u/ml, Inject 47 units sq at bedtime Freestyle Kit Sensor- Use to check BS, Change every 14 days  Patient declined Amlodipine 10 mg, Crestor 20 mg, Ferrous Gluconate 324 mg, Aspirin 81 mg, Cetirizine 10 mg, Fluticasone Propionate, Edarbi 80 mg, Rosuvastatin 20 mg, Hydrochlorothiazide 12.5 mg, Pantoprazole 40 mg and Temazepam 30 mg last month due to last fill date of 09/13/2019 for a 90 day supply from [facility].   Patient is due for next adherence delivery on: 08/12/021.  Called patient and reviewed medications and coordinated  delivery. This delivery to include: Freestyle Kit Sensor- Use to check BS, change every 14 days Toujeo Solo Inj 300 u/ml- Inject 50 units sq daily Temazepam 30 mg- Take one capsule at bedtime as needed for sleep Insulin Pen Needle 32 G X 6 MM, Use with Toujeo insulin pen daily Humalog Kwikpen 100 units/ml Inject as directed per sliding scale  Patient declined the following medications Clindamycin Lotion 1 %- no longer using, Fluticasone Propionate NS- not needed at this time, and  OneTouch Verio test strips- patient is using [employer]. Ozempic- patient has enough samples to last for the rest of the month.  Patient needs refills for: Freestyle Kit Sensor- Use to check BS, change every 14 days Toujeo Solo Inj 300 u/ml- Inject 50 units sq daily Temazepam 30 mg- Take one capsule at bedtime as needed for sleep Insulin Pen Needle 32 G X 6 MM, Use with Toujeo insulin pen daily. Humalog Kwikpen 100 units/ml Inject as directed per sliding scale  Confirmed delivery date of 11/01/2019, advised patient that pharmacy will contact them the morning of delivery.  Allergies:   Allergies  Allergen Reactions  . Fiasp [Insulin Aspart (W-Niacinamide)] Hives    Medications: Outpatient Encounter Medications as of 10/26/2019  Medication Sig  . amLODipine (NORVASC) 10 MG tablet Take 1 tablet (10 mg total) by mouth daily.  Marland Kitchen aspirin EC 81 MG tablet Take 81 mg by mouth daily.   . Blood Glucose Monitoring Suppl (ONETOUCH VERIO) w/Device KIT Use as directed to check blood sugars 2 times per day dx: e11.65  . cetirizine (ZYRTEC) 10 MG tablet Take  10 mg by mouth daily.   . Continuous Blood Gluc Receiver (FREESTYLE LIBRE 14 DAY READER) DEVI by Does not apply route.  . Continuous Blood Gluc Sensor (FREESTYLE LIBRE 14 DAY SENSOR) MISC Use as directed to check blood sugars 4 times per day dx: e11.65  . EDARBI 80 MG TABS TAKE 1 TABLET EACH DAY.  . ferrous gluconate (FERGON) 324 MG tablet TAKE 1 TABLET BY  MOUTH TWICE DAILY WITH A MEAL.  Marland Kitchen glucose blood (ONETOUCH VERIO) test strip Use as instructed to check blood sugars 2 times per day dx:e11.65  . insulin glargine, 1 Unit Dial, (TOUJEO SOLOSTAR) 300 UNIT/ML Solostar Pen Inject 47 Units into the skin at bedtime.  . insulin lispro (HUMALOG KWIKPEN) 100 UNIT/ML KwikPen Inject into the skin. Sliding scale  . Insulin Pen Needle 32G X 6 MM MISC Use with pen injectors daily dx code: e11.9  . OneTouch Delica Lancets 09U MISC Use as directed to check blood sugars 2 times per day dx:e11.65  . pantoprazole (PROTONIX) 40 MG tablet Take one tablet by mouth 3 days a week  . rosuvastatin (CRESTOR) 20 MG tablet Take 1 tablet (20 mg total) by mouth daily.  . temazepam (RESTORIL) 30 MG capsule One capsules po qhs prn   No facility-administered encounter medications on file as of 10/26/2019.    Current Diagnosis: Patient Active Problem List   Diagnosis Date Noted  . AKI (acute kidney injury) ([place]) 09/07/2019  . Acute kidney injury ([place]) 09/06/2019  . Bradycardia 09/06/2019  . Hyperglycemia 09/06/2019  . HLD (hyperlipidemia)   . Hypertensive nephropathy 05/16/2019  . Erosive gastritis 11/16/2017  . Fatty liver 11/16/2017  . Abdominal pain 12/11/2012  . Early satiety 12/11/2012  . Hiatal hernia   . Hx of cholecystectomy 02/05/2011  . Flu-like symptoms 02/05/2011  . CONSTIPATION 08/27/2008  . FATTY LIVER DISEASE 05/07/2008  . Nausea with vomiting 04/23/2008  . EPIGASTRIC PAIN 04/23/2008  . RENAL CALCULUS 04/22/2008  . Type 2 diabetes mellitus with stage 2 chronic kidney disease, with long-term current use of insulin ([place]) 05/19/2006  . Type 2 diabetes mellitus with hyperlipidemia ([place]) 05/19/2006  . HYPERTENSION, BENIGN SYSTEMIC 05/19/2006  . GASTROESOPHAGEAL REFLUX, NO ESOPHAGITIS 05/19/2006  . AMENORRHEA 05/19/2006   Recent Relevant Labs: Lab Results  Component Value Date/Time   HGBA1C 9.6 (H) 08/13/2019 11:53 AM   HGBA1C 8.4 (H) 05/16/2019  03:08 PM    Kidney Function Lab Results  Component Value Date/Time   CREATININE 1.52 (H) 10/06/2019 08:12 PM   CREATININE 1.42 (H) 09/11/2019 03:58 PM   CREATININE 0.74 08/30/2011 01:07 PM   GFR 77.09 11/17/2017 09:00 AM   GFRNONAA 36 (L) 10/06/2019 08:12 PM   GFRAA 41 (L) 10/06/2019 08:12 PM    . Current antihyperglycemic regimen:  o Toujeo Solo Inj 300 u/ml, Inject 50 units sq at bedtime.  Humalog Kwikpen 100 units/ml Inject as directed per sliding scale  Ozempic 0.9m weekly  . What recent interventions/DTPs have been made to improve glycemic control:  o Trying to watch what she eats/ . Have there been any recent hospitalizations or ED visits since last visit with CPP? No . Patient denies hypoglycemic symptoms, including Pale, Sweaty, Shaky, Hungry, Nervous/irritable and Vision changes . Patient denies hyperglycemic symptoms, including blurry vision, excessive thirst, fatigue, polyuria and weakness . How often are you checking your blood sugar? twice daily . What are your blood sugars ranging?  o Fasting: 129, 122 o Before meals: none o After meals: 300's- She know this  was high due to something she ate.  o Bedtime: none . During the week, how often does your blood glucose drop below 70? Never  Adherence Review: Is the patient currently on a STATIN medication? Yes- Rosuvastatin 20 mg Is the patient currently on ACE/ARB medication? No Does the patient have >5 day gap between last estimated fill dates? No   Juliane Poot Follow-Up:  Comptroller- Patient will need a new rx for insulin pen needles, Humalog, and Temazepam sent to [facility]. Jannette Fogo, Pharmacist notified.  Pattricia Boss, [place] Pharmacist Assistant [phone]

## 2019-10-31 ENCOUNTER — Telehealth: Payer: Self-pay

## 2019-10-31 ENCOUNTER — Other Ambulatory Visit: Payer: Self-pay

## 2019-10-31 ENCOUNTER — Other Ambulatory Visit: Payer: Self-pay | Admitting: Nurse Practitioner

## 2019-10-31 DIAGNOSIS — E1122 Type 2 diabetes mellitus with diabetic chronic kidney disease: Secondary | ICD-10-CM

## 2019-10-31 DIAGNOSIS — I10 Essential (primary) hypertension: Secondary | ICD-10-CM

## 2019-10-31 DIAGNOSIS — N182 Chronic kidney disease, stage 2 (mild): Secondary | ICD-10-CM

## 2019-10-31 DIAGNOSIS — G4700 Insomnia, unspecified: Secondary | ICD-10-CM

## 2019-10-31 MED ORDER — TEMAZEPAM 30 MG PO CAPS: ORAL_CAPSULE | ORAL | 0 refills | Status: DC

## 2019-10-31 MED ORDER — INSULIN LISPRO (1 UNIT DIAL) 100 UNIT/ML (KWIKPEN): PEN_INJECTOR | SUBCUTANEOUS | 2 refills | Status: DC

## 2019-10-31 MED ORDER — TOUJEO SOLOSTAR 300 UNIT/ML ~~LOC~~ SOPN: PEN_INJECTOR | SUBCUTANEOUS | 1 refills | Status: DC

## 2019-10-31 MED ORDER — INSULIN PEN NEEDLE 32G X 6 MM MISC: 3 refills | Status: DC

## 2019-10-31 NOTE — Telephone Encounter (Signed)
The prior auth has been done for the pt's Freestyle libre, waiting on a  Response from the pt's insurance.

## 2019-11-08 NOTE — Patient Instructions (Addendum)
Visit Information  Goals Addressed            This Visit's Progress   . Pharmacy Care Plan       CARE PLAN ENTRY  Current Barriers:  . Chronic Disease Management support, education, and care coordination needs related to Hypertension and Diabetes   Hypertension . Pharmacist Clinical Goal(s): o Over the next 45 days, patient will work with PharmD and providers to achieve BP goal <130/80 . Current regimen:  o Amlodipine 10mg  daily o Edarbi 80mg  daily o Hydrochlorothiazide 12.5mg  daily . Interventions: o Focus on healthy, balanced diet, limiting salt intake o Mailed diet recommendations o Edarbi and HCTZ held during hospitalization (6/17), but patient stated she resumed at discharged. Notified PCP that patient has been taking both medications since Sunday, 6/20.  Marland Kitchen Patient self care activities - Over the next 45 days, patient will: o Check BP daily and if symptomatic, document, and provide at future appointments o Ensure daily salt intake < 2300 mg/day o Continue exercise regimen  Diabetes . Pharmacist Clinical Goal(s): o Over the next 45 days, patient will work with PharmD and providers to achieve A1c goal <7% . Current regimen:  o Toujeo Solostar 50 units daily o Fiasp sliding scale . Interventions: o Discussed diet recommendations and meal planning in depth o Provided patient with carbohydrate counting and meal planning books in office o Discussed adding Ozempic with PCP and patient o Counseled patient on proper use of Ozempic and to eat small meals o Provided patient with new sliding scale directions for Fiasp - DIABETIC SLIDING SCALE (II) - IF BLOOD SUGAR IS:  LESS THAN 100 (NO INSULIN)  100-140 (3 UNITS)  141-180 (6 UNITS)  181-220 (9 UNITS)  221-260 (12 UNITS)  261-300 (15 UNITS)  301-340 (18 UNITS)  MORE THAN 341 (21 UNITS) . Patient self care activities - Over the next 45 days, patient will: o Check blood sugar before meals, document, and provide at  future appointments o Contact provider with any episodes of hypoglycemia  Medication management . Pharmacist Clinical Goal(s): o Over the next 45 days, patient will work with PharmD and providers to maintain optimal medication adherence . Current pharmacy: [facility] . Interventions o Comprehensive medication review performed. o Utilize [facility] for medication synchronization, packaging and delivery . Patient self care activities - Over the next 45 days, patient will: o Focus on medication adherence by medication synchronization and using adherence packaging o Take medications as prescribed o Report any questions or concerns to PharmD and/or provider(s)  Please see past updates related to this goal by clicking on the "Past Updates" button in the selected goal        The patient verbalized understanding of instructions provided today and agreed to receive a mailed copy of patient instruction and/or educational materials.  Telephone follow up appointment with pharmacy team member scheduled for: 11/09/19 @ 2 PM  Jannette Fogo, PharmD Clinical Pharmacist [facility] [phone]   Carbohydrate Counting for Diabetes Mellitus, Adult  Carbohydrate counting is a method of keeping track of how many carbohydrates you eat. Eating carbohydrates naturally increases the amount of sugar (glucose) in the blood. Counting how many carbohydrates you eat helps keep your blood glucose within normal limits, which helps you manage your diabetes (diabetes mellitus). It is important to know how many carbohydrates you can safely have in each meal. This is different for every person. A diet and nutrition specialist (registered dietitian) can help you make a meal plan  and calculate how many carbohydrates you should have at each meal and snack. Carbohydrates are found in the following foods:  Grains, such as breads and cereals.  Dried beans and soy  products.  Starchy vegetables, such as potatoes, peas, and corn.  Fruit and fruit juices.  Milk and yogurt.  Sweets and snack foods, such as cake, cookies, candy, chips, and soft drinks. How do I count carbohydrates? There are two ways to count carbohydrates in food. You can use either of the methods or a combination of both. Reading "Nutrition Facts" on packaged food The "Nutrition Facts" list is included on the labels of almost all packaged foods and beverages in the [place] It includes:  The serving size.  Information about nutrients in each serving, including the grams (g) of carbohydrate per serving. To use the "Nutrition Facts":  Decide how many servings you will have.  Multiply the number of servings by the number of carbohydrates per serving.  The resulting number is the total amount of carbohydrates that you will be having. Learning standard serving sizes of other foods When you eat carbohydrate foods that are not packaged or do not include "Nutrition Facts" on the label, you need to measure the servings in order to count the amount of carbohydrates:  Measure the foods that you will eat with a food scale or measuring cup, if needed.  Decide how many standard-size servings you will eat.  Multiply the number of servings by 15. Most carbohydrate-rich foods have about 15 g of carbohydrates per serving. ? For example, if you eat 8 oz (170 g) of strawberries, you will have eaten 2 servings and 30 g of carbohydrates (2 servings x 15 g = 30 g).  For foods that have more than one food mixed, such as soups and casseroles, you must count the carbohydrates in each food that is included. The following list contains standard serving sizes of common carbohydrate-rich foods. Each of these servings has about 15 g of carbohydrates:   hamburger bun or  English muffin.   oz (15 mL) syrup.   oz (14 g) jelly.  1 slice of bread.  1 six-inch tortilla.  3 oz (85 g) cooked rice or  pasta.  4 oz (113 g) cooked dried beans.  4 oz (113 g) starchy vegetable, such as peas, corn, or potatoes.  4 oz (113 g) hot cereal.  4 oz (113 g) mashed potatoes or  of a large baked potato.  4 oz (113 g) canned or frozen fruit.  4 oz (120 mL) fruit juice.  4-6 crackers.  6 chicken nuggets.  6 oz (170 g) unsweetened dry cereal.  6 oz (170 g) plain fat-free yogurt or yogurt sweetened with artificial sweeteners.  8 oz (240 mL) milk.  8 oz (170 g) fresh fruit or one small piece of fruit.  24 oz (680 g) popped popcorn. Example of carbohydrate counting Sample meal  3 oz (85 g) chicken breast.  6 oz (170 g) brown rice.  4 oz (113 g) corn.  8 oz (240 mL) milk.  8 oz (170 g) strawberries with sugar-free whipped topping. Carbohydrate calculation 1. Identify the foods that contain carbohydrates: ? Rice. ? Corn. ? Milk. ? Strawberries. 2. Calculate how many servings you have of each food: ? 2 servings rice. ? 1 serving corn. ? 1 serving milk. ? 1 serving strawberries. 3. Multiply each number of servings by 15 g: ? 2 servings rice x 15 g = 30 g. ? 1 serving  corn x 15 g = 15 g. ? 1 serving milk x 15 g = 15 g. ? 1 serving strawberries x 15 g = 15 g. 4. Add together all of the amounts to find the total grams of carbohydrates eaten: ? 30 g + 15 g + 15 g + 15 g = 75 g of carbohydrates total. Summary  Carbohydrate counting is a method of keeping track of how many carbohydrates you eat.  Eating carbohydrates naturally increases the amount of sugar (glucose) in the blood.  Counting how many carbohydrates you eat helps keep your blood glucose within normal limits, which helps you manage your diabetes.  A diet and nutrition specialist (registered dietitian) can help you make a meal plan and calculate how many carbohydrates you should have at each meal and snack. This information is not intended to replace advice given to you by your health care provider. Make sure you  discuss any questions you have with your health care provider. Document Revised: 09/30/2016 Document Reviewed: 08/20/2015 Elsevier Patient Education  [place].

## 2019-11-09 ENCOUNTER — Ambulatory Visit (INDEPENDENT_AMBULATORY_CARE_PROVIDER_SITE_OTHER): Payer: Medicare Other

## 2019-11-09 ENCOUNTER — Other Ambulatory Visit: Payer: Self-pay

## 2019-11-09 DIAGNOSIS — N182 Chronic kidney disease, stage 2 (mild): Secondary | ICD-10-CM | POA: Diagnosis not present

## 2019-11-09 DIAGNOSIS — I10 Essential (primary) hypertension: Secondary | ICD-10-CM | POA: Diagnosis not present

## 2019-11-09 DIAGNOSIS — E1122 Type 2 diabetes mellitus with diabetic chronic kidney disease: Secondary | ICD-10-CM

## 2019-11-09 DIAGNOSIS — Z794 Long term (current) use of insulin: Secondary | ICD-10-CM | POA: Diagnosis not present

## 2019-11-09 NOTE — Chronic Care Management (AMB) (Signed)
Chronic Care Management Pharmacy  Name: Leslie Davis  MRN: [id number] DOB: 09-10-1955  Chief Complaint/ HPI  [name],  64 y.o. , female presents for their Follow-Up CCM visit with the clinical pharmacist via telephone due to COVID-19 Pandemic. Patient was not able to talk long, but said that she would call back at a later time.  PCP : [name], MD  Their chronic conditions include: Hypertension, Diabetes with CKD, Hyperlipidemia, GERD, Insomnia  Office Visits: 10/31/19 Temazepam $RemoveBeforeDEI'30mg'LhMhXlcuWexVRYVW$  ordered for insomnia as needed  10/10/19 OV: Hospital follow up  10/08/19 Telephone call: Pt instructed to increase Toujeo to 50 units at bedtime and call with [facility]BG on Tuesdays and Thursdays. Appt scheduled for Wednesday.   09/11/19 OV: Toujeo increased to 47 units daily and Fiasp started per sliding scale. Stop Wilder Glade due to acute kidney injury.   08/13/19 OV: Presented for diabetes check. Pt reported that she would like to stop metformin because of side effects (diarrhea). Metformin discontinued and started [place] $RemoveBefo'10mg'uBxlWBQNuyV$  daily. Pt advised to stay well hydrated and follow up in 4 weeks. Labs ordered (HgbA1c, BMP8+EGFR, Lipid panel). Will consider discontinuing HCTZ at next visit.   05/16/19 OV: Presented for DM and HTN follow up. Pt complained of GI issues with metformin. Advised to change metformin dose to once daily. Labs ordered (BMP8+EGFR, HgbA1c, ANA, IFA w/ reflex, TSH, Testosterone total). Referred to Dermatology for evaluation of hair thinning. Dietary and exercise recommendations provided. Exercise and weight loss encouraged.   02/08/19 OV: Presented for routine wellness examination. Labs ordered (Hgb A1c, lipid panel). EKG done with no new changes noted.   Consult Visits: 10/06/19 ED admission: Elevated BG since having cortisone shots. Most recent reading was in to 400s. Also complained of polyuria and polydipsia. Labs did not show signs of DKA. Presentation not consistent with  HHS. No sign of hypertensive emergency. Given fluids and insulin and hyperglycemia improved. Recommend supportive care, dietary changes and discussion with PCP about medication changes.   10/03/19 Orthopedic OV w/ C. Robbins: Kenalog injections in both knees for progressing mild to moderate tricompartmental degenerative changes. Discussed activity modification, ice, and anti-inflammatories.   09/27/19 No show for sleep study  09/27/19 Orthopedic surgery telephone call: Pt notified she could not be prescribed pain medications due to not being seen in 2 months and missing last post-op visit. Advised pt to schedule appt if she feels like she needs to be seen. Recommend OTC pain medications (Tylenol, ibuprofen, or naproxen).   09/06/19-09/08/19 ED admission: Presented for evaluation of generalized fatigue and weakness occurring for the last 2 weeks. Pt reported symptoms are getting worse. Some generalized muscle spasms also noted. Mild dyspnea present. Headache present but no vision changes. Pt also noted BG has been running higher than normal. Creatinine significantly elevated compared to 5/24. Mild hyponatremia. Glucose elevated without evidence of ketoacidosis. ECG showed nonspecific T wave changes (new compared to 2020). Acute kidney injury possibly due to new medication, Farxiga. Hold HCTZ and ARB due to AKI.   07/26/19 Orthopedic surgery OV w/ Dr. Clance Boll and PT: Presented for follow up 5 weeks post left knee arthroscopy w/ partial medial menisectomy. Pt reported pain she experienced before surgery is gone. Physical therapy goals completed. Pt to continue home exercise program. Follow up in 6 weeks.   06/26/19 Sports Medicine OV w/ C. Robbins: Presented for follow up 1 week post knee arthroscopy  and partial menisectomy and reported doing well. Pt to progress to outpatient physical therapy. Follow up in  4 weeks.    06/20/19: Left knee arthroscopy and partial medial menisectomy performed by C. Unk Lightning  05/23/19  Sports Medicine OV w/ C. Robbins: Presented for second opinion for chronic left knee pain. Past treatments have included cortisone injections and gel lubrication injection. Pt advised to wait 2 more weeks to determine if gel lubrication injection provides any symptom resolution. If no relief, pt will have knee scope for partial meniscectomy.   05/08/19 Orthopedic OV w. Dr. Case: Presented for left knee Monovisc injection for left knee osteoarthritis  05/07/19 ED visit: Presented for injury to the right shoulder that occurred yesterday while performing weight assisted pull ups. Pt reported moderate to severe, throbbing pain. Follow up with orthopedic. Pt given sling for comfort.   05/02/19 Orthopedic OV w. Dr. Case: Presented for left knee Monovisc injection for left knee osteoarthritis  02/06/19 Physical Therapy for generalized muscle weakness, chronic left knee pain, joint stiffness, and difficulty walking (2x weekly)  CCM Encounters: 08/28/19 SW: Contacted pt to assist with obtaining BP meter, but pt stated she had already gotten one.   08/14/19 RN: Review of diabetes treatment regimen and discussed appropriate goals. Evaluated current treatment plan for hypertriglyceridemia. Pt education provided.   07/18/19 RN: Reviewed current treatment plans relating to acute and chronic conditions. Provided patient education.   06/11/19 PharmD: Comprehensive medication review performed. Toujeo increased to 34 units daily. Dietary and exercise recommendations provided.   03/30/19 RN: Evaluation of current treatment plans and patient education relating to disease states, diet, and exercise.   02/19/19 PharmD: Comprehensive medication review performed  02/12/19 RN: Evaluation of current treatment plans and patient education relating to disease states, diet, and exercise. Collaboration with PharmD regarding medications.  Medications: Outpatient Encounter Medications as of 11/09/2019  Medication Sig  . amLODipine  (NORVASC) 10 MG tablet Take 1 tablet (10 mg total) by mouth daily.  Marland Kitchen aspirin EC 81 MG tablet Take 81 mg by mouth daily.   . Blood Glucose Monitoring Suppl (ONETOUCH VERIO) w/Device KIT Use as directed to check blood sugars 2 times per day dx: e11.65  . cetirizine (ZYRTEC) 10 MG tablet Take 10 mg by mouth daily.   . Continuous Blood Gluc Receiver (FREESTYLE LIBRE 14 DAY READER) DEVI by Does not apply route.  . Continuous Blood Gluc Sensor (FREESTYLE LIBRE 14 DAY SENSOR) MISC Use as directed to check blood sugars 4 times per day dx: e11.65  . EDARBI 80 MG TABS TAKE 1 TABLET EACH DAY.  . ferrous gluconate (FERGON) 324 MG tablet TAKE 1 TABLET BY MOUTH TWICE DAILY WITH A MEAL.  Marland Kitchen glucose blood (ONETOUCH VERIO) test strip Use as instructed to check blood sugars 2 times per day dx:e11.65  . insulin glargine, 1 Unit Dial, (TOUJEO SOLOSTAR) 300 UNIT/ML Solostar Pen Inject 50 units subcutaneous at bedtime  . insulin lispro (HUMALOG KWIKPEN) 100 UNIT/ML KwikPen Sliding scale  . Insulin Pen Needle 32G X 6 MM MISC Use with pen injectors daily dx code: e11.9  . OneTouch Delica Lancets 30Q MISC Use as directed to check blood sugars 2 times per day dx:e11.65  . pantoprazole (PROTONIX) 40 MG tablet Take one tablet by mouth 3 days a week  . rosuvastatin (CRESTOR) 20 MG tablet Take 1 tablet (20 mg total) by mouth daily.  . temazepam (RESTORIL) 30 MG capsule One capsules po qhs prn   No facility-administered encounter medications on file as of 11/09/2019.     Current Diagnosis/Assessment:  Goals Addressed  This Visit's Progress   . Pharmacy Care Plan       CARE PLAN ENTRY  Current Barriers:  . Chronic Disease Management support, education, and care coordination needs related to Hypertension and Diabetes   Hypertension . Pharmacist Clinical Goal(s): o Over the next 45 days, patient will work with PharmD and providers to achieve BP goal <130/80 . Current regimen:  o Amlodipine 10mg   daily o Edarbi 80mg  daily o Hydrochlorothiazide 12.5mg  daily . Interventions: o Focus on healthy, balanced diet, limiting salt intake o Mailed diet recommendations o Edarbi and HCTZ held during hospitalization (6/17), but patient stated she resumed at discharged. Notified PCP that patient has been taking both medications since Sunday, 6/20.  Marland Kitchen Patient self care activities - Over the next 45 days, patient will: o Check BP daily and if symptomatic, document, and provide at future appointments o Ensure daily salt intake < 2300 mg/day o Continue exercise regimen  Diabetes . Pharmacist Clinical Goal(s): o Over the next 45 days, patient will work with PharmD and providers to achieve A1c goal <7% . Current regimen:  o Toujeo Solostar 50 units daily o Ozempic 0.25mg  weekly o Humalog per sliding scale . Interventions: o Discussed diet recommendations and meal planning in depth o Provided patient with carbohydrate counting and meal planning books in office o Counseled patient on proper use of Ozempic and to eat small meals o Provided patient with new sliding scale directions for Fiasp (changed to Humalog) - DIABETIC SLIDING SCALE (II) - IF BLOOD SUGAR IS:  LESS THAN 100 (NO INSULIN)  100-140 (3 UNITS)  141-180 (6 UNITS)  181-220 (9 UNITS)  221-260 (12 UNITS)  261-300 (15 UNITS)  301-340 (18 UNITS)  MORE THAN 341 (21 UNITS) . Patient self care activities - Over the next 45 days, patient will: o Check blood sugar before meals, document, and provide at future appointments o Contact provider with any episodes of hypoglycemia  Medication management . Pharmacist Clinical Goal(s): o Over the next 45 days, patient will work with PharmD and providers to maintain optimal medication adherence . Current pharmacy: [facility] . Interventions o Comprehensive medication review performed. o Utilize [facility] for medication synchronization, packaging and delivery . Patient  self care activities - Over the next 45 days, patient will: o Focus on medication adherence by medication synchronization and using adherence packaging o Take medications as prescribed o Report any questions or concerns to PharmD and/or provider(s)  Please see past updates related to this goal by clicking on the "Past Updates" button in the selected goal         Diabetes   Recent Relevant Labs: Lab Results  Component Value Date/Time   HGBA1C 10.5 (H) 11/15/2019 10:29 AM   HGBA1C 9.6 (H) 08/13/2019 11:53 AM   MICROALBUR 150 11/15/2019 10:20 AM    Kidney Function Lab Results  Component Value Date/Time   CREATININE 1.11 (H) 11/15/2019 10:29 AM   CREATININE 1.52 (H) 10/06/2019 08:12 PM   CREATININE 0.74 08/30/2011 01:07 PM   GFR 77.09 11/17/2017 09:00 AM   GFRNONAA 52 (L) 11/15/2019 10:29 AM   GFRAA 60 11/15/2019 10:29 AM  Stage 3a CKD  Checking BG: Before meals  Recent FBG Readings: 109, 89  Recent pre-meal BG readings: 127, 182 Patient has failed these meds in past: Ozempic (stopped d/t SE), Tresiba, Levemir, Novolog 70/30, Januvia, Farxiga (AKI) Patient is currently uncontrolled on the following medications:   Toujeo Solostar 50 units daily  Humalog sliding scale  Ozempic 0.25mg   once weekly  Last diabetic Foot exam: 11/08/18 Last diabetic Eye exam: 11/03/18 Lab Results  Component Value Date/Time   HMDIABEYEEXA No Retinopathy 08/02/2019 12:00 AM     We discussed:   Fiasp changed to Humalog due to patient having a reaction to Fiasp (itching)  Denies side effects with Ozempic  States understanding of sliding scale directions  Plan Continue current medications   Hypertension   Office blood pressures are  BP Readings from Last 3 Encounters:  11/15/19 128/78  11/15/19 128/78  10/10/19 120/60   Patient has failed these meds in the past: Losartan, Olmesartan/HCTZ Patient is currently controlled on the following medications:   Amlodipine 10mg   daily  Edarbi 80mg  daily  Hydrochlorothiazide 12.5mg  daily  Patient checks BP at home daily  Patient home BP readings are ranging: None provided today  Plan Continue current medications   Hyperlipidemia   Lipid Panel     Component Value Date/Time   CHOL 209 (H) 11/15/2019 1029   TRIG 73 11/15/2019 1029   HDL 87 11/15/2019 1029   CHOLHDL 2.4 11/15/2019 1029   CHOLHDL 2.8 09/13/2007 0405   VLDL 17 09/13/2007 0405   LDLCALC 109 (H) 11/15/2019 1029   LABVLDL 13 11/15/2019 1029    The 10-year ASCVD risk score (Goff DC Jr., et al., 2013) is: 20.9%   Values used to calculate the score:     Age: 89 years     Sex: Female     Is Non-Hispanic African American: Yes     Diabetic: Yes     Tobacco smoker: No     Systolic Blood Pressure: 782 mmHg     Is BP treated: Yes     HDL Cholesterol: 87 mg/dL     Total Cholesterol: 209 mg/dL   Patient has failed these meds in past: N/A Patient is currently controlled on the following medications:   Rosuvastatin 20mg  daily  Aspirin 81mg  daily  Plan  Continue current medications  GERD   Patient has failed these meds in past: Aciphex, Zantac Patient is currently controlled on the following medications:   Pantoprazole 40mg  daily  Plan  Continue current medications   Anemia   Hgb: 11.3 (RR 12-15 g/dL) on 01/25/19  Patient has failed these meds in past: N/A Patient is currently controlled on the following medications:   Ferrous gluconate 324mg  twice daily with a meal  Plan  Continue current medications  Insomnia   Patient has failed these meds in past: N/A Patient is currently controlled on the following medications:   Temazepam 30mg  at bedtime as needed  Plan  Continue current medications  Allergies   Patient has failed these meds in past: N/A Patient is currently controlled on the following medications:   Cetirizine 10mg  daily  Fluticasone nasal spray  Plan  Continue current medications  Vaccines    Reviewed and discussed patient's vaccination history. No [facility] records available  Immunization History  Administered Date(s) Administered  . DTaP 03/26/2013  . Fluad Quad(high Dose 65+) 11/08/2018  . Influenza, High Dose Seasonal PF 11/08/2018  . Influenza-Unspecified 11/30/2017  . Moderna SARS-COVID-2 Vaccination 08/03/2019, 08/31/2019  . Td 08/20/2005   Pneumonia, Shingrix  Plan  Discuss vaccination at follow up appointment  Medication Management   Pt uses [facility] for all medications Uses pill box? Yes Pt endorses 100% compliance  We discussed:   The importance of taking medications daily as directed  Packaging and delivery of medications is going well. Pt reported no issues at this time.  Next delivery scheduled for September  Plan Utilize [facility] for medication synchronization, packaging and delivery  Follow up: 2 week phone visit  Jannette Fogo, PharmD Clinical Pharmacist [facility] [phone]

## 2019-11-14 ENCOUNTER — Encounter: Payer: Medicare HMO | Admitting: Internal Medicine

## 2019-11-15 ENCOUNTER — Other Ambulatory Visit: Payer: Self-pay

## 2019-11-15 ENCOUNTER — Ambulatory Visit (INDEPENDENT_AMBULATORY_CARE_PROVIDER_SITE_OTHER): Payer: Medicare Other

## 2019-11-15 ENCOUNTER — Ambulatory Visit: Payer: Medicare HMO

## 2019-11-15 ENCOUNTER — Encounter: Payer: Self-pay | Admitting: Internal Medicine

## 2019-11-15 ENCOUNTER — Ambulatory Visit (INDEPENDENT_AMBULATORY_CARE_PROVIDER_SITE_OTHER): Payer: Medicare Other | Admitting: Internal Medicine

## 2019-11-15 VITALS — BP 128/78 | HR 61 | Temp 97.9°F | Ht 64.0 in | Wt 209.4 lb

## 2019-11-15 VITALS — BP 128/78 | HR 61 | Temp 97.9°F | Ht 64.0 in | Wt 209.0 lb

## 2019-11-15 DIAGNOSIS — N182 Chronic kidney disease, stage 2 (mild): Secondary | ICD-10-CM | POA: Diagnosis not present

## 2019-11-15 DIAGNOSIS — Z6835 Body mass index (BMI) 35.0-35.9, adult: Secondary | ICD-10-CM

## 2019-11-15 DIAGNOSIS — I129 Hypertensive chronic kidney disease with stage 1 through stage 4 chronic kidney disease, or unspecified chronic kidney disease: Secondary | ICD-10-CM

## 2019-11-15 DIAGNOSIS — Z794 Long term (current) use of insulin: Secondary | ICD-10-CM | POA: Diagnosis not present

## 2019-11-15 DIAGNOSIS — Z0000 Encounter for general adult medical examination without abnormal findings: Secondary | ICD-10-CM

## 2019-11-15 DIAGNOSIS — E559 Vitamin D deficiency, unspecified: Secondary | ICD-10-CM

## 2019-11-15 DIAGNOSIS — E1122 Type 2 diabetes mellitus with diabetic chronic kidney disease: Secondary | ICD-10-CM

## 2019-11-15 LAB — POCT URINALYSIS DIPSTICK
Bilirubin, UA: NEGATIVE
Blood, UA: NEGATIVE
Glucose, UA: NEGATIVE
Ketones, UA: NEGATIVE
Leukocytes, UA: NEGATIVE
Nitrite, UA: NEGATIVE
Protein, UA: POSITIVE — AB
Spec Grav, UA: 1.02 (ref 1.010–1.025)
Urobilinogen, UA: 0.2 E.U./dL
pH, UA: 5.5 (ref 5.0–8.0)

## 2019-11-15 LAB — POCT UA - MICROALBUMIN
Creatinine, POC: 200 mg/dL
Microalbumin Ur, POC: 150 mg/L

## 2019-11-15 NOTE — Progress Notes (Signed)
This visit occurred during the SARS-CoV-2 public health emergency.  Safety protocols were in place, including screening questions prior to the visit, additional usage of staff PPE, and extensive cleaning of exam room while observing appropriate contact time as indicated for disinfecting solutions.  Subjective:   KATHYE CIPRIANI is a 64 y.o. female who presents for Medicare Annual (Subsequent) preventive examination.  Review of Systems     Cardiac Risk Factors include: advanced age (>26men, >43 women);diabetes mellitus;dyslipidemia;hypertension;obesity (BMI >30kg/m2)     Objective:    Today's Vitals   11/15/19 0922  BP: 128/78  Pulse: 61  Temp: 97.9 F (36.6 C)  TempSrc: Oral  SpO2: 98%  Weight: 209 lb 6.4 oz (95 kg)  Height: $Remove'5\' 4"'ywAnVZN$  (1.626 m)   Body mass index is 35.94 kg/m.  Advanced Directives 11/15/2019 10/06/2019 09/07/2019 09/06/2019 05/07/2019 02/08/2019 01/25/2019  Does Patient Have a Medical Advance Directive? No No No No No No No  Would patient like information on creating a medical advance directive? - - No - Patient declined - Yes (ED - Information included in AVS) Yes (ED - Information included in AVS) No - Guardian declined    Current Medications (verified) Outpatient Encounter Medications as of 11/15/2019  Medication Sig  . amLODipine (NORVASC) 10 MG tablet Take 1 tablet (10 mg total) by mouth daily.  Marland Kitchen aspirin EC 81 MG tablet Take 81 mg by mouth daily.   . Blood Glucose Monitoring Suppl (ONETOUCH VERIO) w/Device KIT Use as directed to check blood sugars 2 times per day dx: e11.65  . cetirizine (ZYRTEC) 10 MG tablet Take 10 mg by mouth daily.   . Continuous Blood Gluc Receiver (FREESTYLE LIBRE 14 DAY READER) DEVI by Does not apply route.  . Continuous Blood Gluc Sensor (FREESTYLE LIBRE 14 DAY SENSOR) MISC Use as directed to check blood sugars 4 times per day dx: e11.65  . EDARBI 80 MG TABS TAKE 1 TABLET EACH DAY.  . ferrous gluconate (FERGON) 324 MG tablet TAKE 1  TABLET BY MOUTH TWICE DAILY WITH A MEAL.  Marland Kitchen glucose blood (ONETOUCH VERIO) test strip Use as instructed to check blood sugars 2 times per day dx:e11.65  . insulin glargine, 1 Unit Dial, (TOUJEO SOLOSTAR) 300 UNIT/ML Solostar Pen Inject 50 units subcutaneous at bedtime  . insulin lispro (HUMALOG KWIKPEN) 100 UNIT/ML KwikPen Sliding scale  . Insulin Pen Needle 32G X 6 MM MISC Use with pen injectors daily dx code: e11.9  . OneTouch Delica Lancets 62H MISC Use as directed to check blood sugars 2 times per day dx:e11.65  . pantoprazole (PROTONIX) 40 MG tablet Take one tablet by mouth 3 days a week  . rosuvastatin (CRESTOR) 20 MG tablet Take 1 tablet (20 mg total) by mouth daily.  . temazepam (RESTORIL) 30 MG capsule One capsules po qhs prn   No facility-administered encounter medications on file as of 11/15/2019.    Allergies (verified) Fiasp [insulin aspart (w-niacinamide)]   History: Past Medical History:  Diagnosis Date  . DM (diabetes mellitus) ([place])   . Fatty liver disease, nonalcoholic   . Gastritis   . Gastroparesis   . GERD (gastroesophageal reflux disease)   . Hiatal hernia   . Hiatal hernia   . HLD (hyperlipidemia)   . HTN (hypertension)   . Renal calculus    Past Surgical History:  Procedure Laterality Date  . CHOLECYSTECTOMY    . COLONOSCOPY    . TUBAL LIGATION    . UPPER GASTROINTESTINAL ENDOSCOPY  Family History  Problem Relation Age of Onset  . Diabetes Mother   . Diabetes Maternal Grandmother   . Stomach cancer Maternal Aunt        X 2 aunts  . Hypertension Father   . Colon cancer Neg Hx   . Esophageal cancer Neg Hx   . Rectal cancer Neg Hx    Social History   Socioeconomic History  . Marital status: Married    Spouse name: Not on file  . Number of children: 2  . Years of education: Not on file  . Highest education level: Not on file  Occupational History  . Occupation: housekeeping  Tobacco Use  . Smoking status: Former Smoker    Packs/day:  0.25    Years: 1.00    Pack years: 0.25    Quit date: 09/10/1974    Years since quitting: 45.2  . Smokeless tobacco: Never Used  . Tobacco comment: she no longer smokes.   Vaping Use  . Vaping Use: Never used  Substance and Sexual Activity  . Alcohol use: No  . Drug use: No  . Sexual activity: Not Currently  Other Topics Concern  . Not on file  Social History Narrative  . Not on file   Social Determinants of Health   Financial Resource Strain: Low Risk   . Difficulty of Paying Living Expenses: Not hard at all  Food Insecurity: No Food Insecurity  . Worried About Programme researcher, broadcasting/film/video in the Last Year: Never true  . Ran Out of Food in the Last Year: Never true  Transportation Needs: No Transportation Needs  . Lack of Transportation (Medical): No  . Lack of Transportation (Non-Medical): No  Physical Activity: Sufficiently Active  . Days of Exercise per Week: 2 days  . Minutes of Exercise per Session: 120 min  Stress: No Stress Concern Present  . Feeling of Stress : Not at all  Social Connections:   . Frequency of Communication with Friends and Family: Not on file  . Frequency of Social Gatherings with Friends and Family: Not on file  . Attends Religious Services: Not on file  . Active Member of Clubs or Organizations: Not on file  . Attends Banker Meetings: Not on file  . Marital Status: Not on file    Tobacco Counseling Counseling given: Not Answered Comment: she no longer smokes.    Clinical Intake:  Pre-visit preparation completed: Yes  Pain : No/denies pain     Nutritional Status: BMI > 30  Obese Nutritional Risks: None Diabetes: Yes  How often do you need to have someone help you when you read instructions, pamphlets, or other written materials from your doctor or pharmacy?: 1 - Never What is the last grade level you completed in school?: 12th grade  Diabetic? Yes Nutrition Risk Assessment:  Has the patient had any N/V/D within the last 2  months?  No  Does the patient have any non-healing wounds?  No  Has the patient had any unintentional weight loss or weight gain?  No   Diabetes:  Is the patient diabetic?  Yes  If diabetic, was a CBG obtained today?  No  Did the patient bring in their glucometer from home?  No  How often do you monitor your CBG's? 2-3 daily.   Financial Strains and Diabetes Management:  Are you having any financial strains with the device, your supplies or your medication? No .  Does the patient want to be seen by Chronic Care  Management for management of their diabetes?  No  Would the patient like to be referred to a Nutritionist or for Diabetic Management?  No   Diabetic Exams:  Diabetic Eye Exam: Completed 08/02/2019 Diabetic Foot Exam: Completed today  Interpreter Needed?: No  Information entered by :: NAllen LPN   Activities of Daily Living In your present state of health, do you have any difficulty performing the following activities: 11/15/2019 09/07/2019  Hearing? N N  Vision? N N  Difficulty concentrating or making decisions? N N  Walking or climbing stairs? Y N  Comment due to knee -  Dressing or bathing? N N  Doing errands, shopping? N N  Preparing Food and eating ? N -  Using the Toilet? N -  In the past six months, have you accidently leaked urine? N -  Do you have problems with loss of bowel control? N -  Managing your Medications? N -  Managing your Finances? N -  Housekeeping or managing your Housekeeping? N -  Some recent data might be hidden    Patient Care Team: [name], MD as PCP - General (Internal Medicine) Rex Kras, Claudette Stapler, RN as Case Manager [place], Kennieth Francois, [facility] (Pharmacist)  Indicate any recent Medical Services you may have received from other than [facility] providers in the past year (date may be approximate).     Assessment:   This is a routine wellness examination for [place].  Hearing/Vision screen  Hearing Screening   '125Hz'$  $Remo'250Hz'vKwic$'500Hz'$'1000Hz'$   '2000Hz'$  $Remov'3000Hz'jZZQfR$'4000Hz'$'6000Hz'$'8000Hz'$   Right ear:           Left ear:           Vision Screening Comments: Regular eye exams,   Dietary issues and exercise activities discussed: Current Exercise Habits: Home exercise routine, Type of exercise: walking (stationary bike), Time (Minutes): > 60, Frequency (Times/Week): 2, Weekly Exercise (Minutes/Week): 0  Goals    .  "I am still working with PT for strengthing in my L knee" (pt-stated)      CARE PLAN ENTRY (see longitudinal plan of care for additional care plan information)  Current Barriers:  Marland Kitchen Knowledge Deficits related to S/P left knee arthroscopy with partial (30%) medial menisectomy completed on 06/20/19  . Referring Service/Team: [id number] JOINT [facility]PLACEMENT/Colby Leverne Humbles, PA-C . Chronic Disease Management support and education needs related to Type 2 diabetes mellitus, Hypertensive nephropathy, Chronic renal failure  Nurse Case Manager Clinical Goal(s):  Marland Kitchen Over the next 90 days, patient will work with her health care team  to address needs related to disease education and support for improved Self Health management of S/P left knee arthroscopy with partial medial menisectomy  CCM RN CM Interventions:  07/19/19 call completed with patient  . Inter-disciplinary care team collaboration (see longitudinal plan of care) . Evaluation of current treatment plan related to S/P left knee arthroscopy w/partial medial menisectomy  and patient's adherence to plan as established by provider . Determined patient is working with PT through [facility] . Reviewed and discussed the following treatment plan;  o 07/20/19: 3 weeks S/P left knee arthroscopy with partial (30%) medial menisectomy. 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 Decreased limp today. No swelling noted. No pain with therapeutic exercises. Skilled physical therapy is required to address impairments and maximize  function Therapy Diagnosis:  1. Pain and swelling of left knee  2. Decreased ROM of left knee  3. Difficulty walking  due to knee joint  4. Impaired functional mobility, balance, gait, and endurance  5. S/P left knee arthroscopy   - PLAN Treatment Frequency, Duration, and Interventions: Plan Treatment Frequency and duration : 1x/week for 4 weeks - after IE (POC expires 09/01/19) Recommended Consults: None currently . Determined patient is also following a HEP as directed by PT and is attending the gym 2-3 times weekly . Discussed plans with patient for ongoing care management follow up and provided patient with direct contact information for care management team  Patient Self Care Activities:  . Self administers medications as prescribed . Attends all scheduled provider appointments . Calls pharmacy for medication refills . Performs ADL's independently . Performs IADL's independently . Calls provider office for new concerns or questions  Initial goal documentation     .  "I would like to better manage my Diabetes" (pt-stated)      Current Barriers:  Marland Kitchen Knowledge Deficits related to diabetes disease process and Self Health Management  . Chronic Disease Management support and education needs related to Type 2 diabetes mellitus, Hypertensive nephropathy, Chronic renal failure  Nurse Case Manager Clinical Goal(s):  Marland Kitchen Over the next 90 days, patient will work with the CCM team to address needs related to education and support to improve Self management of DM . New - 02/12/19 - Over the next 90 days, patient will lower her A1C <8.2 Goal Not Met . New - 07/19/19 Over the next 90 days, patient will work with embedded CCM team on disease education and support to improve Self Health management of DM to help lower A1c <7.0 %  CCM RN CM Interventions:  10/11/19 call completed with patient  . Determined patient received a Cortisol injection to her L knee increasing her blood glucose  levels . Determined patient was seen in the ED at [facility] on 10/06/19 with dx: Hyperglycemia, blood sugar noted to be 486 . Reviewed and discussed patient's f/u with Dr. Baird Cancer on 10/10/19; Determined patient has a good understanding of DM recommendations provided including adherence to the Sliding Scale provided  . Stressed importance of patient monitoring her FBS as directed and keeping Dr. Baird Cancer and the CCM team well informed of all hyper and or  hypoglycemic episodes  . Discussed plans with patient for ongoing care management follow up and provided patient with direct contact information for care management team  Patient Self Care Activities:  . Self administers medications as prescribed . Attends all scheduled provider appointments . Calls pharmacy for medication refills . Attends church or other social activities . Performs ADL's independently . Performs IADL's independently . Calls provider office for new concerns or questions  Please see past updates related to this goal by clicking on the "Past Updates" button in the selected goal      .  Exercise 150 min/wk Moderate Activity    .  Patient Stated      11/15/2019, wants to get off insulin    .  Pharmacy Care Plan      CARE PLAN ENTRY  Current Barriers:  . Chronic Disease Management support, education, and care coordination needs related to Hypertension and Diabetes   Hypertension . Pharmacist Clinical Goal(s): o Over the next 45 days, patient will work with PharmD and providers to achieve BP goal <130/80 . Current regimen:  o Amlodipine 10mg  daily o Edarbi 80mg  daily o Hydrochlorothiazide 12.5mg  daily . Interventions: o Focus on healthy, balanced diet, limiting salt intake o Mailed diet recommendations o Edarbi and  HCTZ held during hospitalization (6/17), but patient stated she resumed at discharged. Notified PCP that patient has been taking both medications since Sunday, 6/20.  Marland Kitchen Patient self care activities - Over  the next 45 days, patient will: o Check BP daily and if symptomatic, document, and provide at future appointments o Ensure daily salt intake < 2300 mg/day o Continue exercise regimen  Diabetes . Pharmacist Clinical Goal(s): o Over the next 45 days, patient will work with PharmD and providers to achieve A1c goal <7% . Current regimen:  o Toujeo Solostar 50 units daily o Fiasp sliding scale . Interventions: o Discussed diet recommendations and meal planning in depth o Provided patient with carbohydrate counting and meal planning books in office o Discussed adding Ozempic with PCP and patient o Counseled patient on proper use of Ozempic and to eat small meals o Provided patient with new sliding scale directions for Fiasp - DIABETIC SLIDING SCALE (II) - IF BLOOD SUGAR IS:  LESS THAN 100 (NO INSULIN)  100-140 (3 UNITS)  141-180 (6 UNITS)  181-220 (9 UNITS)  221-260 (12 UNITS)  261-300 (15 UNITS)  301-340 (18 UNITS)  MORE THAN 341 (21 UNITS) . Patient self care activities - Over the next 45 days, patient will: o Check blood sugar before meals, document, and provide at future appointments o Contact provider with any episodes of hypoglycemia  Medication management . Pharmacist Clinical Goal(s): o Over the next 45 days, patient will work with PharmD and providers to maintain optimal medication adherence . Current pharmacy: [facility] . Interventions o Comprehensive medication review performed. o Utilize [facility] for medication synchronization, packaging and delivery . Patient self care activities - Over the next 45 days, patient will: o Focus on medication adherence by medication synchronization and using adherence packaging o Take medications as prescribed o Report any questions or concerns to PharmD and/or provider(s)  Please see past updates related to this goal by clicking on the "Past Updates" button in the selected goal      .  Weight (lb) < 200 lb  (90.7 kg)      She would like to lose 15 pounds within the next six months.       Depression Screen PHQ 2/9 Scores 11/15/2019 02/08/2019 11/08/2018 06/13/2018 03/07/2018  PHQ - 2 Score 0 0 0 0 0  PHQ- 9 Score 0 - - - -    Fall Risk Fall Risk  11/15/2019 02/08/2019 11/08/2018 06/13/2018  Falls in the past year? 0 0 0 0  Number falls in past yr: - - 0 -  Injury with Fall? - 0 - -  Risk for fall due to : Medication side effect - - -  Follow up Falls evaluation completed;Education provided;Falls prevention discussed - - -    Any stairs in or around the home? Yes  If so, are there any without handrails? No  Home free of loose throw rugs in walkways, pet beds, electrical cords, etc? Yes  Adequate lighting in your home to reduce risk of falls? Yes   ASSISTIVE DEVICES UTILIZED TO PREVENT FALLS:  Life alert? No  Use of a cane, walker or w/c? No  Grab bars in the bathroom? No  Shower chair or bench in shower? No  Elevated toilet seat or a handicapped toilet? No   TIMED UP AND GO:  Was the test performed? No .   Gait steady and fast without use of assistive device  Cognitive Function:     6CIT Screen 11/15/2019  02/08/2019 11/08/2018  What Year? 0 points 0 points 0 points  What month? 0 points 0 points 0 points  What time? 0 points 0 points 0 points  Count back from 20 0 points 0 points 0 points  Months in reverse 0 points 0 points 0 points  Repeat phrase 2 points 0 points 2 points  Total Score 2 0 2    Immunizations Immunization History  Administered Date(s) Administered  . DTaP 03/26/2013  . Fluad Quad(high Dose 65+) 11/08/2018  . Influenza, High Dose Seasonal PF 11/08/2018  . Influenza-Unspecified 11/30/2017  . Moderna SARS-COVID-2 Vaccination 08/03/2019, 08/31/2019  . Td 08/20/2005    TDAP status: Up to date Flu Vaccine status: Up to date Pneumococcal vaccine status: Decline Covid-19 vaccine status: Completed vaccines  Qualifies for Shingles Vaccine? Yes   Zostavax  completed No   Shingrix Completed?: No.    Education has been provided regarding the importance of this vaccine. Patient has been advised to call insurance company to determine out of pocket expense if they have not yet received this vaccine. Advised may also receive vaccine at local pharmacy or Health Dept. Verbalized acceptance and understanding.  Screening Tests Health Maintenance  Topic Date Due  . INFLUENZA VACCINE  10/21/2019  . FOOT EXAM  11/08/2019  . PNA vac Low Risk Adult (1 of 2 - PCV13) 02/08/2020 (Originally 10/26/2018)  . HEMOGLOBIN A1C  02/13/2020  . OPHTHALMOLOGY EXAM  08/01/2020  . MAMMOGRAM  11/01/2020  . COLONOSCOPY  09/09/2021  . TETANUS/TDAP  03/27/2023  . DEXA SCAN  Completed  . COVID-19 Vaccine  Completed  . Hepatitis C Screening  Completed    Health Maintenance  Health Maintenance Due  Topic Date Due  . INFLUENZA VACCINE  10/21/2019  . FOOT EXAM  11/08/2019    Colorectal cancer screening: Completed 09/10/2011. Repeat every 10 years Mammogram status: needs to schedule Bone Density status: Completed 09/19/2017.   Lung Cancer Screening: (Low Dose CT Chest recommended if Age 19-80 years, 30 pack-year currently smoking OR have quit w/in 15years.) does not qualify.   Lung Cancer Screening Referral: no  Additional Screening:  Hepatitis C Screening: does qualify; Completed 12/06/2017  Vision Screening: Recommended annual ophthalmology exams for early detection of glaucoma and other disorders of the eye. Is the patient up to date with their annual eye exam?  Yes  Who is the provider or what is the name of the office in which the patient attends annual eye exams? Does not remember If pt is not established with a provider, would they like to be referred to a provider to establish care? No .   Dental Screening: Recommended annual dental exams for proper oral hygiene  Community Resource Referral / Chronic Care Management: CRR required this visit?  No   CCM  required this visit?  No      Plan:     I have personally reviewed and noted the following in the patient's chart:   . Medical and social history . Use of alcohol, tobacco or illicit drugs  . Current medications and supplements . Functional ability and status . Nutritional status . Physical activity . Advanced directives . List of other physicians . Hospitalizations, surgeries, and ER visits in previous 12 months . Vitals . Screenings to include cognitive, depression, and falls . Referrals and appointments  In addition, I have reviewed and discussed with patient certain preventive protocols, quality metrics, and best practice recommendations. A written personalized care plan for preventive services as well as  general preventive health recommendations were provided to patient.     Kellie Simmering, LPN   [id number]   Nurse Notes:

## 2019-11-15 NOTE — Patient Instructions (Signed)
Health Maintenance, Female Adopting a healthy lifestyle and getting preventive care are important in promoting health and wellness. Ask your health care provider about:  The right schedule for you to have regular tests and exams.  Things you can do on your own to prevent diseases and keep yourself healthy. What should I know about diet, weight, and exercise? Eat a healthy diet   Eat a diet that includes plenty of vegetables, fruits, low-fat dairy products, and lean protein.  Do not eat a lot of foods that are high in solid fats, added sugars, or sodium. Maintain a healthy weight Body mass index (BMI) is used to identify weight problems. It estimates body fat based on height and weight. Your health care provider can help determine your BMI and help you achieve or maintain a healthy weight. Get regular exercise Get regular exercise. This is one of the most important things you can do for your health. Most adults should:  Exercise for at least 150 minutes each week. The exercise should increase your heart rate and make you sweat (moderate-intensity exercise).  Do strengthening exercises at least twice a week. This is in addition to the moderate-intensity exercise.  Spend less time sitting. Even light physical activity can be beneficial. Watch cholesterol and blood lipids Have your blood tested for lipids and cholesterol at 66 years of age, then have this test every 5 years. Have your cholesterol levels checked more often if:  Your lipid or cholesterol levels are high.  You are older than 66 years of age.  You are at high risk for heart disease. What should I know about cancer screening? Depending on your health history and family history, you may need to have cancer screening at various ages. This may include screening for:  Breast cancer.  Cervical cancer.  Colorectal cancer.  Skin cancer.  Lung cancer. What should I know about heart disease, diabetes, and high blood  pressure? Blood pressure and heart disease  High blood pressure causes heart disease and increases the risk of stroke. This is more likely to develop in people who have high blood pressure readings, are of African descent, or are overweight.  Have your blood pressure checked: ? Every 3-5 years if you are 18-39 years of age. ? Every year if you are 40 years old or older. Diabetes Have regular diabetes screenings. This checks your fasting blood sugar level. Have the screening done:  Once every three years after age 40 if you are at a normal weight and have a low risk for diabetes.  More often and at a younger age if you are overweight or have a high risk for diabetes. What should I know about preventing infection? Hepatitis B If you have a higher risk for hepatitis B, you should be screened for this virus. Talk with your health care provider to find out if you are at risk for hepatitis B infection. Hepatitis C Testing is recommended for:  Everyone born from 1945 through 1965.  Anyone with known risk factors for hepatitis C. Sexually transmitted infections (STIs)  Get screened for STIs, including gonorrhea and chlamydia, if: ? You are sexually active and are younger than 66 years of age. ? You are older than 66 years of age and your health care provider tells you that you are at risk for this type of infection. ? Your sexual activity has changed since you were last screened, and you are at increased risk for chlamydia or gonorrhea. Ask your health care provider if   you are at risk.  Ask your health care provider about whether you are at high risk for HIV. Your health care provider may recommend a prescription medicine to help prevent HIV infection. If you choose to take medicine to prevent HIV, you should first get tested for HIV. You should then be tested every 3 months for as long as you are taking the medicine. Pregnancy  If you are about to stop having your period (premenopausal) and  you may become pregnant, seek counseling before you get pregnant.  Take 400 to 800 micrograms (mcg) of folic acid every day if you become pregnant.  Ask for birth control (contraception) if you want to prevent pregnancy. Osteoporosis and menopause Osteoporosis is a disease in which the bones lose minerals and strength with aging. This can result in bone fractures. If you are 65 years old or older, or if you are at risk for osteoporosis and fractures, ask your health care provider if you should:  Be screened for bone loss.  Take a calcium or vitamin D supplement to lower your risk of fractures.  Be given hormone replacement therapy (HRT) to treat symptoms of menopause. Follow these instructions at home: Lifestyle  Do not use any products that contain nicotine or tobacco, such as cigarettes, e-cigarettes, and chewing tobacco. If you need help quitting, ask your health care provider.  Do not use street drugs.  Do not share needles.  Ask your health care provider for help if you need support or information about quitting drugs. Alcohol use  Do not drink alcohol if: ? Your health care provider tells you not to drink. ? You are pregnant, may be pregnant, or are planning to become pregnant.  If you drink alcohol: ? Limit how much you use to 0-1 drink a day. ? Limit intake if you are breastfeeding.  Be aware of how much alcohol is in your drink. In the [place] one drink equals one 12 oz bottle of beer (355 mL), one 5 oz glass of wine (148 mL), or one 1 oz glass of hard liquor (44 mL). General instructions  Schedule regular health, dental, and eye exams.  Stay current with your vaccines.  Tell your health care provider if: ? You often feel depressed. ? You have ever been abused or do not feel safe at home. Summary  Adopting a healthy lifestyle and getting preventive care are important in promoting health and wellness.  Follow your health care provider's instructions about healthy  diet, exercising, and getting tested or screened for diseases.  Follow your health care provider's instructions on monitoring your cholesterol and blood pressure. This information is not intended to replace advice given to you by your health care provider. Make sure you discuss any questions you have with your health care provider. Document Revised: 03/01/2018 Document Reviewed: 03/01/2018 Elsevier Patient Education  2020 [employer]  

## 2019-11-15 NOTE — Patient Instructions (Signed)
Leslie Davis , Thank you for taking time to come for your Medicare Wellness Visit. I appreciate your ongoing commitment to your health goals. Please review the following plan we discussed and let me know if I can assist you in the future.   Screening recommendations/referrals: Colonoscopy: completed 09/10/2011 Mammogram: to be scheduled Bone Density: completed 09/19/2017 Recommended yearly ophthalmology/optometry visit for glaucoma screening and checkup Recommended yearly dental visit for hygiene and checkup  Vaccinations: Influenza vaccine: due Pneumococcal vaccine: decline Tdap vaccine: completed 03/26/2013 Shingles vaccine: discussed   Covid-19:08/31/2019, 08/03/2019  Advanced directives: Advance directive discussed with you today. Even though you declined this today please call our office should you change your mind and we can give you the proper paperwork for you to fill out.   Conditions/risks identified: none  Next appointment: Follow up in one year for your annual wellness visit 11/20/2020 at 10:00   Preventive Care 65 Years and Older, Female Preventive care refers to lifestyle choices and visits with your health care provider that can promote health and wellness. What does preventive care include?  A yearly physical exam. This is also called an annual well check.  Dental exams once or twice a year.  Routine eye exams. Ask your health care provider how often you should have your eyes checked.  Personal lifestyle choices, including:  Daily care of your teeth and gums.  Regular physical activity.  Eating a healthy diet.  Avoiding tobacco and drug use.  Limiting alcohol use.  Practicing safe sex.  Taking low-dose aspirin every day.  Taking vitamin and mineral supplements as recommended by your health care provider. What happens during an annual well check? The services and screenings done by your health care provider during your annual well check will depend on your  age, overall health, lifestyle risk factors, and family history of disease. Counseling  Your health care provider may ask you questions about your:  Alcohol use.  Tobacco use.  Drug use.  Emotional well-being.  Home and relationship well-being.  Sexual activity.  Eating habits.  History of falls.  Memory and ability to understand (cognition).  Work and work Statistician.  Reproductive health. Screening  You may have the following tests or measurements:  Height, weight, and BMI.  Blood pressure.  Lipid and cholesterol levels. These may be checked every 5 years, or more frequently if you are over 75 years old.  Skin check.  Lung cancer screening. You may have this screening every year starting at age 36 if you have a 30-pack-year history of smoking and currently smoke or have quit within the past 15 years.  Fecal occult blood test (FOBT) of the stool. You may have this test every year starting at age 40.  Flexible sigmoidoscopy or colonoscopy. You may have a sigmoidoscopy every 5 years or a colonoscopy every 10 years starting at age 25.  Hepatitis C blood test.  Hepatitis B blood test.  Sexually transmitted disease (STD) testing.  Diabetes screening. This is done by checking your blood sugar (glucose) after you have not eaten for a while (fasting). You may have this done every 1-3 years.  Bone density scan. This is done to screen for osteoporosis. You may have this done starting at age 27.  Mammogram. This may be done every 1-2 years. Talk to your health care provider about how often you should have regular mammograms. Talk with your health care provider about your test results, treatment options, and if necessary, the need for more tests. Vaccines  Your health care provider may recommend certain vaccines, such as:  Influenza vaccine. This is recommended every year.  Tetanus, diphtheria, and acellular pertussis (Tdap, Td) vaccine. You may need a Td booster  every 10 years.  Zoster vaccine. You may need this after age 31.  Pneumococcal 13-valent conjugate (PCV13) vaccine. One dose is recommended after age 47.  Pneumococcal polysaccharide (PPSV23) vaccine. One dose is recommended after age 60. Talk to your health care provider about which screenings and vaccines you need and how often you need them. This information is not intended to replace advice given to you by your health care provider. Make sure you discuss any questions you have with your health care provider. Document Released: 04/04/2015 Document Revised: 11/26/2015 Document Reviewed: 01/07/2015 Elsevier Interactive Patient Education  2017 [place] Prevention in the Home Falls can cause injuries. They can happen to people of all ages. There are many things you can do to make your home safe and to help prevent falls. What can I do on the outside of my home?  Regularly fix the edges of walkways and driveways and fix any cracks.  Remove anything that might make you trip as you walk through a door, such as a raised step or threshold.  Trim any bushes or trees on the path to your home.  Use bright outdoor lighting.  Clear any walking paths of anything that might make someone trip, such as rocks or tools.  Regularly check to see if handrails are loose or broken. Make sure that both sides of any steps have handrails.  Any raised decks and porches should have guardrails on the edges.  Have any leaves, snow, or ice cleared regularly.  Use sand or salt on walking paths during winter.  Clean up any spills in your garage right away. This includes oil or grease spills. What can I do in the bathroom?  Use night lights.  Install grab bars by the toilet and in the tub and shower. Do not use towel bars as grab bars.  Use non-skid mats or decals in the tub or shower.  If you need to sit down in the shower, use a plastic, non-slip stool.  Keep the floor dry. Clean up any  water that spills on the floor as soon as it happens.  Remove soap buildup in the tub or shower regularly.  Attach bath mats securely with double-sided non-slip rug tape.  Do not have throw rugs and other things on the floor that can make you trip. What can I do in the bedroom?  Use night lights.  Make sure that you have a light by your bed that is easy to reach.  Do not use any sheets or blankets that are too big for your bed. They should not hang down onto the floor.  Have a firm chair that has side arms. You can use this for support while you get dressed.  Do not have throw rugs and other things on the floor that can make you trip. What can I do in the kitchen?  Clean up any spills right away.  Avoid walking on wet floors.  Keep items that you use a lot in easy-to-reach places.  If you need to reach something above you, use a strong step stool that has a grab bar.  Keep electrical cords out of the way.  Do not use floor polish or wax that makes floors slippery. If you must use wax, use non-skid floor wax.  Do  not have throw rugs and other things on the floor that can make you trip. What can I do with my stairs?  Do not leave any items on the stairs.  Make sure that there are handrails on both sides of the stairs and use them. Fix handrails that are broken or loose. Make sure that handrails are as long as the stairways.  Check any carpeting to make sure that it is firmly attached to the stairs. Fix any carpet that is loose or worn.  Avoid having throw rugs at the top or bottom of the stairs. If you do have throw rugs, attach them to the floor with carpet tape.  Make sure that you have a light switch at the top of the stairs and the bottom of the stairs. If you do not have them, ask someone to add them for you. What else can I do to help prevent falls?  Wear shoes that:  Do not have high heels.  Have rubber bottoms.  Are comfortable and fit you well.  Are closed  at the toe. Do not wear sandals.  If you use a stepladder:  Make sure that it is fully opened. Do not climb a closed stepladder.  Make sure that both sides of the stepladder are locked into place.  Ask someone to hold it for you, if possible.  Clearly mark and make sure that you can see:  Any grab bars or handrails.  First and last steps.  Where the edge of each step is.  Use tools that help you move around (mobility aids) if they are needed. These include:  Canes.  Walkers.  Scooters.  Crutches.  Turn on the lights when you go into a dark area. Replace any light bulbs as soon as they burn out.  Set up your furniture so you have a clear path. Avoid moving your furniture around.  If any of your floors are uneven, fix them.  If there are any pets around you, be aware of where they are.  Review your medicines with your doctor. Some medicines can make you feel dizzy. This can increase your chance of falling. Ask your doctor what other things that you can do to help prevent falls. This information is not intended to replace advice given to you by your health care provider. Make sure you discuss any questions you have with your health care provider. Document Released: 01/02/2009 Document Revised: 08/14/2015 Document Reviewed: 04/12/2014 Elsevier Interactive Patient Education  2017 [employer].

## 2019-11-15 NOTE — Progress Notes (Signed)
I,Tianna Badgett,acting as a Education administrator for [name], MD.,have documented all relevant documentation on the behalf of [name], MD,as directed by  [name], MD while in the presence of [name], MD.  This visit occurred during the SARS-CoV-2 public health emergency.  Safety protocols were in place, including screening questions prior to the visit, additional usage of staff PPE, and extensive cleaning of exam room while observing appropriate contact time as indicated for disinfecting solutions.  Subjective:     Patient ID: Leslie Davis , female    DOB: 1955/05/16 , 64 y.o.   MRN: [id number]   Chief Complaint  Patient presents with  . Annual Exam  . Diabetes  . Hypertension    HPI  She is here today for a full physical examination. She is followed by GYN for her pelvic exams. She reports compliance with meds. States her blood sugars have improved.   Diabetes She presents for her follow-up diabetic visit. She has type 2 diabetes mellitus. Her disease course has been stable. There are no hypoglycemic associated symptoms. Pertinent negatives for diabetes include no blurred vision. There are no hypoglycemic complications. Diabetic complications include nephropathy. Risk factors for coronary artery disease include diabetes mellitus, dyslipidemia, hypertension and post-menopausal.  Hypertension This is a chronic problem. The current episode started more than 1 year ago. The problem has been gradually improving since onset. Pertinent negatives include no blurred vision. Risk factors for coronary artery disease include dyslipidemia and diabetes mellitus. The current treatment provides moderate improvement.     Past Medical History:  Diagnosis Date  . DM (diabetes mellitus) ([place])   . Fatty liver disease, nonalcoholic   . Gastritis   . Gastroparesis   . GERD (gastroesophageal reflux disease)   . Hiatal hernia   . Hiatal hernia   . HLD (hyperlipidemia)   . HTN  (hypertension)   . Renal calculus      Family History  Problem Relation Age of Onset  . Diabetes Mother   . Diabetes Maternal Grandmother   . Stomach cancer Maternal Aunt        X 2 aunts  . Hypertension Father   . Colon cancer Neg Hx   . Esophageal cancer Neg Hx   . Rectal cancer Neg Hx      Current Outpatient Medications:  .  amLODipine (NORVASC) 10 MG tablet, Take 1 tablet (10 mg total) by mouth daily., Disp: 90 tablet, Rfl: 1 .  aspirin EC 81 MG tablet, Take 81 mg by mouth daily. , Disp: , Rfl:  .  Blood Glucose Monitoring Suppl (ONETOUCH VERIO) w/Device KIT, Use as directed to check blood sugars 2 times per day dx: e11.65, Disp: 1 kit, Rfl: 1 .  cetirizine (ZYRTEC) 10 MG tablet, Take 10 mg by mouth daily. , Disp: , Rfl:  .  Continuous Blood Gluc Receiver (FREESTYLE LIBRE 14 DAY READER) DEVI, by Does not apply route., Disp: , Rfl:  .  Continuous Blood Gluc Sensor (FREESTYLE LIBRE 14 DAY SENSOR) MISC, Use as directed to check blood sugars 4 times per day dx: e11.65, Disp: 2 each, Rfl: 4 .  EDARBI 80 MG TABS, TAKE 1 TABLET EACH DAY., Disp: 90 tablet, Rfl: 1 .  ferrous gluconate (FERGON) 324 MG tablet, TAKE 1 TABLET BY MOUTH TWICE DAILY WITH A MEAL., Disp: 100 tablet, Rfl: 0 .  glucose blood (ONETOUCH VERIO) test strip, Use as instructed to check blood sugars 2 times per day dx:e11.65, Disp: 150 each,  Rfl: 3 .  insulin glargine, 1 Unit Dial, (TOUJEO SOLOSTAR) 300 UNIT/ML Solostar Pen, Inject 50 units subcutaneous at bedtime, Disp: 22.5 mL, Rfl: 1 .  insulin lispro (HUMALOG KWIKPEN) 100 UNIT/ML KwikPen, Sliding scale, Disp: 15 mL, Rfl: 2 .  Insulin Pen Needle 32G X 6 MM MISC, Use with pen injectors daily dx code: e11.9, Disp: 150 each, Rfl: 3 .  OneTouch Delica Lancets 27O MISC, Use as directed to check blood sugars 2 times per day dx:e11.65, Disp: 150 each, Rfl: 3 .  pantoprazole (PROTONIX) 40 MG tablet, Take one tablet by mouth 3 days a week, Disp: 30 tablet, Rfl: 3 .  rosuvastatin  (CRESTOR) 20 MG tablet, Take 1 tablet (20 mg total) by mouth daily., Disp: 90 tablet, Rfl: 1 .  temazepam (RESTORIL) 30 MG capsule, One capsules po qhs prn, Disp: 60 capsule, Rfl: 0 .  Cholecalciferol (VITAMIN D3) 1.25 MG (50000 UT) CAPS, Take 1 capsule by mouth on Tuesday and Friday, Disp: 24 capsule, Rfl: 1   Allergies  Allergen Reactions  . Fiasp [Insulin Aspart (W-Niacinamide)] Hives      The patient states she uses post menopausal status for birth control. Last LMP was No LMP recorded. Patient is postmenopausal.. Negative for Dysmenorrhea. Negative for: breast discharge, breast lump(s), breast pain and breast self exam. Associated symptoms include abnormal vaginal bleeding. Pertinent negatives include abnormal bleeding (hematology), anxiety, decreased libido, depression, difficulty falling sleep, dyspareunia, history of infertility, nocturia, sexual dysfunction, sleep disturbances, urinary incontinence, urinary urgency, vaginal discharge and vaginal itching. Diet regular.The patient states her exercise level is  intermittent.   . The patient's tobacco use is:  Social History   Tobacco Use  Smoking Status Former Smoker  . Packs/day: 0.25  . Years: 1.00  . Pack years: 0.25  . Quit date: 09/10/1974  . Years since quitting: 45.2  Smokeless Tobacco Never Used  Tobacco Comment   she no longer smokes.   . She has been exposed to passive smoke. The patient's alcohol use is:  Social History   Substance and Sexual Activity  Alcohol Use No    Review of Systems  Constitutional: Negative.   HENT: Negative.   Eyes: Negative.  Negative for blurred vision.  Respiratory: Negative.   Cardiovascular: Negative.   Gastrointestinal: Negative.   Endocrine: Negative.   Genitourinary: Negative.   Musculoskeletal: Negative.   Skin: Negative.   Allergic/Immunologic: Negative.   Neurological: Negative.   Hematological: Negative.   Psychiatric/Behavioral: Negative.      Today's Vitals    11/15/19 0931  BP: 128/78  Pulse: 61  Temp: 97.9 F (36.6 C)  TempSrc: Oral  Weight: 209 lb (94.8 kg)  Height: 5' 4" (1.626 m)   Body mass index is 35.87 kg/m.   Objective:  Physical Exam Vitals and nursing note reviewed.  Constitutional:      Appearance: Normal appearance. She is obese.  HENT:     Head: Normocephalic and atraumatic.     Right Ear: Tympanic membrane, ear canal and external ear normal.     Left Ear: Tympanic membrane, ear canal and external ear normal.     Nose: Nose normal.     Mouth/Throat:     Mouth: Mucous membranes are moist.     Pharynx: Oropharynx is clear.  Eyes:     Extraocular Movements: Extraocular movements intact.     Conjunctiva/sclera: Conjunctivae normal.     Pupils: Pupils are equal, round, and reactive to light.  Cardiovascular:     Rate  and Rhythm: Normal rate and regular rhythm.     Pulses: Normal pulses.          Dorsalis pedis pulses are 2+ on the right side and 2+ on the left side.     Heart sounds: Normal heart sounds.  Pulmonary:     Effort: Pulmonary effort is normal.     Breath sounds: Normal breath sounds.  Chest:     Breasts: Tanner Score is 5.        Right: Normal.        Left: Normal.  Abdominal:     General: Bowel sounds are normal.     Palpations: Abdomen is soft.     Comments: Rounded, soft.   Genitourinary:    Comments: deferred Musculoskeletal:        General: Normal range of motion.     Cervical back: Normal range of motion and neck supple.  Feet:     Right foot:     Protective Sensation: 5 sites tested. 5 sites sensed.     Skin integrity: Callus and dry skin present.     Toenail Condition: Right toenails are normal.     Left foot:     Protective Sensation: 5 sites tested. 5 sites sensed.     Skin integrity: Callus and dry skin present.     Toenail Condition: Left toenails are normal.  Skin:    General: Skin is warm and dry.  Neurological:     General: No focal deficit present.     Mental Status: She  is alert and oriented to person, place, and time.  Psychiatric:        Mood and Affect: Mood normal.        Behavior: Behavior normal.         Assessment And Plan:     1. Routine general medical examination at health care facility Comments: A full exam was performed. Importance of monthly self breast exams was discussed.  PATIENT IS ADVISED TO GET 30-45 MINUTES REGULAR EXERCISE NO LESS THAN FOUR TO FIVE DAYS PER WEEK - BOTH WEIGHTBEARING EXERCISES AND AEROBIC ARE RECOMMENDED.  PATIENT IS ADVISED TO FOLLOW A HEALTHY DIET WITH AT LEAST SIX FRUITS/VEGGIES PER DAY, DECREASE INTAKE OF RED MEAT, AND TO INCREASE FISH INTAKE TO TWO DAYS PER WEEK.  MEATS/FISH SHOULD NOT BE FRIED, BAKED OR BROILED IS PREFERABLE.  I SUGGEST WEARING SPF 50 SUNSCREEN ON EXPOSED PARTS AND ESPECIALLY WHEN IN THE DIRECT SUNLIGHT FOR AN EXTENDED PERIOD OF TIME.  PLEASE AVOID FAST FOOD RESTAURANTS AND INCREASE YOUR WATER INTAKE.    2. Type 2 diabetes mellitus with stage 2 chronic kidney disease, with long-term current use of insulin ([facility]) Comments: Diabetic foot exam performed.  Will continue with current medication and adjust medication as needed after reviewing lab results.  I DISCUSSED WITH THE PATIENT AT LENGTH REGARDING THE GOALS OF GLYCEMIC CONTROL AND POSSIBLE LONG-TERM COMPLICATIONS.  I  ALSO STRESSED THE IMPORTANCE OF COMPLIANCE WITH HOME GLUCOSE MONITORING, DIETARY RESTRICTIONS INCLUDING AVOIDANCE OF SUGARY DRINKS/PROCESSED FOODS,  ALONG WITH REGULAR EXERCISE.  I  ALSO STRESSED THE IMPORTANCE OF ANNUAL EYE EXAMS, SELF FOOT CARE AND COMPLIANCE WITH OFFICE VISITS.  - POCT Urinalysis Dipstick (81002) - POCT UA - Microalbumin - Hemoglobin A1c - Lipid panel - BMP8+EGFR  3. Parenchymal renal hypertension, stage 1 through stage 4 or unspecified chronic kidney disease Comments: Chronic, controlled. Will continue with current medication.  EKG performed, NSR w/ T wave abnormality. She is encouraged to avoid adding   salt to her  foods. She will rto in four months for re-evaluation.  - EKG 12-Lead - BMP8+EGFR  4. Vitamin D deficiency Comments: I will check a vitamin D level and supplement as needed.  - VITAMIN D 25 Hydroxy (Vit-D Deficiency, Fractures)  5. Class 2 severe obesity due to excess calories with serious comorbidity and body mass index (BMI) of 35.0 to 35.9 in adult [facility]) She is encouraged to strive for BMI less than 30 to decrease cardiac risk. Advised to aim for at least 150 minutes of exercise per week. Advised her first goal should be to reach weight under 200 pounds.  Wt Readings from Last 3 Encounters:  11/15/19 209 lb (94.8 kg)  11/15/19 209 lb 6.4 oz (95 kg)  10/10/19 209 lb 9.6 oz (95.1 kg)       Patient was given opportunity to ask questions. Patient verbalized understanding of the plan and was able to repeat key elements of the plan. All questions were answered to their satisfaction.   [name], MD   I, [name], MD, have reviewed all documentation for this visit. The documentation on 12/01/19 for the exam, diagnosis, procedures, and orders are all accurate and complete.  THE PATIENT IS ENCOURAGED TO PRACTICE SOCIAL DISTANCING DUE TO THE COVID-19 PANDEMIC.

## 2019-11-16 LAB — CMP14+EGFR
ALT: 47 IU/L — ABNORMAL HIGH (ref 0–32)
AST: 31 IU/L (ref 0–40)
Albumin/Globulin Ratio: 1.3 (ref 1.2–2.2)
Albumin: 3.9 g/dL (ref 3.8–4.8)
Alkaline Phosphatase: 130 IU/L — ABNORMAL HIGH (ref 48–121)
BUN/Creatinine Ratio: 20 (ref 12–28)
BUN: 22 mg/dL (ref 8–27)
Bilirubin Total: 0.2 mg/dL (ref 0.0–1.2)
CO2: 22 mmol/L (ref 20–29)
Calcium: 10 mg/dL (ref 8.7–10.3)
Chloride: 103 mmol/L (ref 96–106)
Creatinine, Ser: 1.11 mg/dL — ABNORMAL HIGH (ref 0.57–1.00)
GFR calc Af Amer: 60 mL/min/{1.73_m2} (ref >59)
GFR calc non Af Amer: 52 mL/min/{1.73_m2} — ABNORMAL LOW (ref >59)
Globulin, Total: 3 g/dL (ref 1.5–4.5)
Glucose: 79 mg/dL (ref 65–99)
Potassium: 4.8 mmol/L (ref 3.5–5.2)
Sodium: 141 mmol/L (ref 134–144)
Total Protein: 6.9 g/dL (ref 6.0–8.5)

## 2019-11-16 LAB — HEMOGLOBIN A1C
Est. average glucose Bld gHb Est-mCnc: 255 mg/dL
Hgb A1c MFr Bld: 10.5 % — ABNORMAL HIGH (ref 4.8–5.6)

## 2019-11-16 LAB — CBC
Hematocrit: 38.9 % (ref 34.0–46.6)
Hemoglobin: 12.9 g/dL (ref 11.1–15.9)
MCH: 28.5 pg (ref 26.6–33.0)
MCHC: 33.2 g/dL (ref 31.5–35.7)
MCV: 86 fL (ref 79–97)
Platelets: 287 10*3/uL (ref 150–450)
RBC: 4.53 x10E6/uL (ref 3.77–5.28)
RDW: 14.1 % (ref 11.7–15.4)
WBC: 5.3 10*3/uL (ref 3.4–10.8)

## 2019-11-16 LAB — LIPID PANEL
Chol/HDL Ratio: 2.4 ratio (ref 0.0–4.4)
Cholesterol, Total: 209 mg/dL — ABNORMAL HIGH (ref 100–199)
HDL: 87 mg/dL (ref >39)
LDL Chol Calc (NIH): 109 mg/dL — ABNORMAL HIGH (ref 0–99)
Triglycerides: 73 mg/dL (ref 0–149)
VLDL Cholesterol Cal: 13 mg/dL (ref 5–40)

## 2019-11-16 LAB — VITAMIN D 25 HYDROXY (VIT D DEFICIENCY, FRACTURES): Vit D, 25-Hydroxy: 24.3 ng/mL — ABNORMAL LOW (ref 30.0–100.0)

## 2019-11-19 ENCOUNTER — Other Ambulatory Visit: Payer: Self-pay

## 2019-11-19 ENCOUNTER — Telehealth: Payer: Self-pay

## 2019-11-19 MED ORDER — VITAMIN D3 1.25 MG (50000 UT) PO CAPS: ORAL_CAPSULE | ORAL | 1 refills | Status: DC

## 2019-11-19 NOTE — Telephone Encounter (Signed)
-----   Message from [name], MD sent at 11/16/2019 10:44 PM EDT ----- Here are your lab results:  Your hba1c has gone up to 10.5.  Please check your sugars before breakfast, lunch and dinner. Please keep a logbook, you may come pick up one from office if we have some available (do we?) I would like to refer you to an endocrinologist for further evaluation of your diabetes. Do you agree to this?   Your vitamin D level is low, please send in rx vitamin D to take twice weekly on Tues/Fri. #24/1 refill. Your LDL, bad cholesterol is 109. Goal for someone with diabetes is 70 or less. Have you been taking rosuvastatin daily?   Your kidney function is stable. Your blood count is normal.  Please let me know if you have any questions or concerns. Stay safe!   Sincerely,    [name] N. Baird Cancer, MD

## 2019-11-19 NOTE — Telephone Encounter (Signed)
Left the patient a message to call back for lab results. 

## 2019-11-19 NOTE — Progress Notes (Signed)
The pt said yes that she agrees to see an endocrinologist about her diabetes and yes she takes her cholesterol med every day. I sent a message asking Leslie Davis if we have  any blood sugar log books.

## 2019-11-22 ENCOUNTER — Telehealth: Payer: Self-pay

## 2019-11-22 NOTE — Chronic Care Management (AMB) (Signed)
Chronic Care Management Pharmacy Assistant   Name: Leslie Davis  MRN: [id number] DOB: 1955-06-14  Reason for Encounter: Medication Review/ Medication Dispensing Call  Patient Questions:  1.  Have you seen any other providers since your last visit? Yes, 11/15/2019- Dr Baird Cancer (PCP)  2.  Any changes in your medicines or health? No   PCP : [name], MD  Allergies:   Allergies  Allergen Reactions  . Fiasp [Insulin Aspart (W-Niacinamide)] Hives    Medications: Outpatient Encounter Medications as of 11/22/2019  Medication Sig  . amLODipine (NORVASC) 10 MG tablet Take 1 tablet (10 mg total) by mouth daily.  Marland Kitchen aspirin EC 81 MG tablet Take 81 mg by mouth daily.   . Blood Glucose Monitoring Suppl (ONETOUCH VERIO) w/Device KIT Use as directed to check blood sugars 2 times per day dx: e11.65  . cetirizine (ZYRTEC) 10 MG tablet Take 10 mg by mouth daily.   . Cholecalciferol (VITAMIN D3) 1.25 MG (50000 UT) CAPS Take 1 capsule by mouth on Tuesday and Friday  . Continuous Blood Gluc Receiver (FREESTYLE LIBRE 14 DAY READER) DEVI by Does not apply route.  . Continuous Blood Gluc Sensor (FREESTYLE LIBRE 14 DAY SENSOR) MISC Use as directed to check blood sugars 4 times per day dx: e11.65  . EDARBI 80 MG TABS TAKE 1 TABLET EACH DAY.  . ferrous gluconate (FERGON) 324 MG tablet TAKE 1 TABLET BY MOUTH TWICE DAILY WITH A MEAL.  Marland Kitchen glucose blood (ONETOUCH VERIO) test strip Use as instructed to check blood sugars 2 times per day dx:e11.65  . insulin glargine, 1 Unit Dial, (TOUJEO SOLOSTAR) 300 UNIT/ML Solostar Pen Inject 50 units subcutaneous at bedtime  . insulin lispro (HUMALOG KWIKPEN) 100 UNIT/ML KwikPen Sliding scale  . Insulin Pen Needle 32G X 6 MM MISC Use with pen injectors daily dx code: e11.9  . OneTouch Delica Lancets 28B MISC Use as directed to check blood sugars 2 times per day dx:e11.65  . pantoprazole (PROTONIX) 40 MG tablet Take one tablet by mouth 3 days a week  . rosuvastatin  (CRESTOR) 20 MG tablet Take 1 tablet (20 mg total) by mouth daily.  . temazepam (RESTORIL) 30 MG capsule One capsules po qhs prn   No facility-administered encounter medications on file as of 11/22/2019.    Current Diagnosis: Patient Active Problem List   Diagnosis Date Noted  . AKI (acute kidney injury) ([place]) 09/07/2019  . Acute kidney injury ([place]) 09/06/2019  . Bradycardia 09/06/2019  . Hyperglycemia 09/06/2019  . HLD (hyperlipidemia)   . Hypertensive nephropathy 05/16/2019  . Erosive gastritis 11/16/2017  . Fatty liver 11/16/2017  . Abdominal pain 12/11/2012  . Early satiety 12/11/2012  . Hiatal hernia   . Hx of cholecystectomy 02/05/2011  . Flu-like symptoms 02/05/2011  . CONSTIPATION 08/27/2008  . FATTY LIVER DISEASE 05/07/2008  . Nausea with vomiting 04/23/2008  . EPIGASTRIC PAIN 04/23/2008  . RENAL CALCULUS 04/22/2008  . Type 2 diabetes mellitus with stage 2 chronic kidney disease, with long-term current use of insulin ([place]) 05/19/2006  . Type 2 diabetes mellitus with hyperlipidemia ([place]) 05/19/2006  . HYPERTENSION, BENIGN SYSTEMIC 05/19/2006  . GASTROESOPHAGEAL REFLUX, NO ESOPHAGITIS 05/19/2006  . AMENORRHEA 05/19/2006   Reviewed chart for medication changes ahead of medication coordination call.  No OVs, Consults, or hospital visits since last Pharmacist visit on 11/09/19 with [facility].   No medication changes indicated OR if recent visit, treatment plan here.  BP Readings from Last 3 Encounters:  11/15/19  128/78  11/15/19 128/78  10/10/19 120/60    Lab Results  Component Value Date   HGBA1C 10.5 (H) 11/15/2019    Patient obtains medications through Vials  90 Days   Last adherence delivery included: Freestyle Kit Sensor- Use to check BS, change every 14 days,Toujeo Solo Inj 300 u/ml- Inject 50 units sq daily,Temazepam 30 mg- Take one capsule at bedtime as needed for sleep, Insulin Pen Needle 32 G X 6 MM, Use with Toujeo insulin pen daily, Humalog  Kwikpen 100 units/ml Inject as directed per sliding scale.  Patient declined: Amlodipine 10 mg, Crestor 20 mg, Ferrous Gluconate 324 mg, Aspirin 81 mg, Cetirizine 10 mg, Fluticasone Propionate, Edarbi 80 mg, Rosuvastatin 20 mg, Hydrochlorothiazide 12.5 mg, Pantoprazole 40 mg and Temazepam 30 mg last month due to last fill date of 09/13/2019 for a 90 day supply from [facility].   Patient is due for next adherence delivery on: 12/01/2019. Called patient and reviewed medications and coordinated delivery.  This delivery to include: Onetouch Verio Test Strips- use to test blood sugars twice a day.  Patient declined the following medications Freestyle Kit Sensor- Use to check BS, change every 14 days,Toujeo Solo Inj 300 u/ml- Inject 50 units sq daily,Temazepam 30 mg- Take one capsule at bedtime as needed for sleep, Insulin Pen Needle 32 G X 6 MM, Use with Toujeo insulin pen daily, Humalog Kwikpen 100 units/ml Inject as directed per sliding scale, Amlodipine 10 mg, Crestor 20 mg, Ferrous Gluconate 324 mg, Aspirin 81 mg, Cetirizine 10 mg, Fluticasone Propionate, Edarbi 80 mg, Rosuvastatin 20 mg, Hydrochlorothiazide 12.5 mg, Pantoprazole 40 mg and Temazepam 30 mg  due to having enough until medication sync date of 12/12/2019.   Patient needs refills for Onetouch Verio Test Strips- use to test blood sugars twice a day.  Confirmed delivery date of 12/01/2019, advised patient that pharmacy will contact them the morning of delivery.  Follow-Up:  Coordination of Enhanced Pharmacy Services and Pharmacist Review- Patient has a follow up call on 11/27/2019 at 830 am. Jannette Fogo, CPP aware.   Pattricia Boss, [place] Pharmacist Assistant [phone]

## 2019-11-27 ENCOUNTER — Other Ambulatory Visit: Payer: Self-pay

## 2019-11-27 ENCOUNTER — Ambulatory Visit: Payer: Self-pay

## 2019-11-27 DIAGNOSIS — E1169 Type 2 diabetes mellitus with other specified complication: Secondary | ICD-10-CM

## 2019-11-27 DIAGNOSIS — E785 Hyperlipidemia, unspecified: Secondary | ICD-10-CM

## 2019-11-27 DIAGNOSIS — E1122 Type 2 diabetes mellitus with diabetic chronic kidney disease: Secondary | ICD-10-CM

## 2019-11-27 DIAGNOSIS — N182 Chronic kidney disease, stage 2 (mild): Secondary | ICD-10-CM

## 2019-11-27 NOTE — Chronic Care Management (AMB) (Signed)
Chronic Care Management Pharmacy  Name: Leslie Davis  MRN: [id number] DOB: 1955-12-15  Chief Complaint/ HPI  [name],  65 y.o. , female presents for their Follow-Up CCM visit with the clinical pharmacist via telephone due to COVID-19 Pandemic.  PCP : [name], MD  Their chronic conditions include: Hypertension, Diabetes with CKD, Hyperlipidemia, GERD, Insomnia  Office Visits: 11/15/19 AWV and OV: Physical exam.  HgbA1c up to 10.5%. Advised pt to check BG before breakfast, lunch, and dinner and keep a logbook. Referred to Endocrinologist. Vitamin D level is low. Start Vitamin D 50000 units twice weekly (Tuesday/Friday). LDL is 109, above goal of less than 70. Take rosuvastatin daily. Kidney functio stable. Blood count normal. Diabetic foot exam performed. HTN controlled. EKG performed, NSR w/ T wave abnormality.   10/31/19 Temazepam 71m ordered for insomnia as needed  10/10/19 OV: Hospital follow up. Diabetic foot exam performed.   10/08/19 Telephone call: Pt instructed to increase Toujeo to 50 units at bedtime and call with FBG on Tuesdays and Thursdays. Appt scheduled for Wednesday.   09/11/19 OV: Toujeo increased to 47 units daily and Fiasp started per sliding scale. Stop FWilder Gladedue to acute kidney injury.   08/13/19 OV: Presented for diabetes check. Pt reported that she would like to stop metformin because of side effects (diarrhea). Metformin discontinued and started Farxiga 146mdaily. Pt advised to stay well hydrated and follow up in 4 weeks. Labs ordered (HgbA1c, BMP8+EGFR, Lipid panel). Will consider discontinuing HCTZ at next visit.   05/16/19 OV: Presented for DM and HTN follow up. Pt complained of GI issues with metformin. Advised to change metformin dose to once daily. Labs ordered (BMP8+EGFR, HgbA1c, ANA, IFA w/ reflex, TSH, Testosterone total). Referred to Dermatology for evaluation of hair thinning. Dietary and exercise recommendations provided. Exercise  and weight loss encouraged.   02/08/19 OV: Presented for routine wellness examination. Labs ordered (Hgb A1c, lipid panel). EKG done with no new changes noted.   Consult Visits: 10/06/19 ED admission: Elevated BG since having cortisone shots. Most recent reading was in to 400s. Also complained of polyuria and polydipsia. Labs did not show signs of DKA. Presentation not consistent with HHS. No sign of hypertensive emergency. Given fluids and insulin and hyperglycemia improved. Recommend supportive care, dietary changes and discussion with PCP about medication changes.   10/03/19 Orthopedic OV w/ C. Robbins: Kenalog injections in both knees for progressing mild to moderate tricompartmental degenerative changes. Discussed activity modification, ice, and anti-inflammatories.   09/27/19 No show for sleep study  09/27/19 Orthopedic surgery telephone call: Pt notified she could not be prescribed pain medications due to not being seen in 2 months and missing last post-op visit. Advised pt to schedule appt if she feels like she needs to be seen. Recommend OTC pain medications (Tylenol, ibuprofen, or naproxen).   09/06/19-09/08/19 ED admission: Presented for evaluation of generalized fatigue and weakness occurring for the last 2 weeks. Pt reported symptoms are getting worse. Some generalized muscle spasms also noted. Mild dyspnea present. Headache present but no vision changes. Pt also noted BG has been running higher than normal. Creatinine significantly elevated compared to 5/24. Mild hyponatremia. Glucose elevated without evidence of ketoacidosis. ECG showed nonspecific T wave changes (new compared to 2020). Acute kidney injury possibly due to new medication, Farxiga. Hold HCTZ and ARB due to AKI.   07/26/19 Orthopedic surgery OV w/ Dr. CuClance Bollnd PT: Presented for follow up 5 weeks post left knee arthroscopy w/ partial medial  menisectomy. Pt reported pain she experienced before surgery is gone. Physical therapy  goals completed. Pt to continue home exercise program. Follow up in 6 weeks.   06/26/19 Sports Medicine OV w/ C. Robbins: Presented for follow up 1 week post knee arthroscopy  and partial menisectomy and reported doing well. Pt to progress to outpatient physical therapy. Follow up in 4 weeks.    06/20/19: Left knee arthroscopy and partial medial menisectomy performed by C. Unk Lightning  05/23/19 Sports Medicine OV w/ C. Robbins: Presented for second opinion for chronic left knee pain. Past treatments have included cortisone injections and gel lubrication injection. Pt advised to wait 2 more weeks to determine if gel lubrication injection provides any symptom resolution. If no relief, pt will have knee scope for partial meniscectomy.   05/08/19 Orthopedic OV w. Dr. Case: Presented for left knee Monovisc injection for left knee osteoarthritis  05/07/19 ED visit: Presented for injury to the right shoulder that occurred yesterday while performing weight assisted pull ups. Pt reported moderate to severe, throbbing pain. Follow up with orthopedic. Pt given sling for comfort.   05/02/19 Orthopedic OV w. Dr. Case: Presented for left knee Monovisc injection for left knee osteoarthritis  02/06/19 Physical Therapy for generalized muscle weakness, chronic left knee pain, joint stiffness, and difficulty walking (2x weekly)  CCM Encounters: 08/28/19 SW: Contacted pt to assist with obtaining BP meter, but pt stated she had already gotten one.   08/14/19 RN: Review of diabetes treatment regimen and discussed appropriate goals. Evaluated current treatment plan for hypertriglyceridemia. Pt education provided.   07/18/19 RN: Reviewed current treatment plans relating to acute and chronic conditions. Provided patient education.   06/11/19 PharmD: Comprehensive medication review performed. Toujeo increased to 34 units daily. Dietary and exercise recommendations provided.   03/30/19 RN: Evaluation of current treatment plans and  patient education relating to disease states, diet, and exercise.   02/19/19 PharmD: Comprehensive medication review performed  02/12/19 RN: Evaluation of current treatment plans and patient education relating to disease states, diet, and exercise. Collaboration with PharmD regarding medications.  Medications: Outpatient Encounter Medications as of 11/27/2019  Medication Sig  . Blood Glucose Monitoring Suppl (ONETOUCH VERIO) w/Device KIT Use as directed to check blood sugars 2 times per day dx: e11.65  . Continuous Blood Gluc Receiver (FREESTYLE LIBRE 14 DAY READER) DEVI by Does not apply route.  . Continuous Blood Gluc Sensor (FREESTYLE LIBRE 14 DAY SENSOR) MISC Use as directed to check blood sugars 4 times per day dx: e11.65  . ferrous gluconate (FERGON) 324 MG tablet TAKE 1 TABLET BY MOUTH TWICE DAILY WITH A MEAL.  Marland Kitchen glucose blood (ONETOUCH VERIO) test strip Use as instructed to check blood sugars 2 times per day dx:e11.65  . insulin glargine, 1 Unit Dial, (TOUJEO SOLOSTAR) 300 UNIT/ML Solostar Pen Inject 50 units subcutaneous at bedtime  . Insulin Pen Needle 32G X 6 MM MISC Use with pen injectors daily dx code: e11.9  . OneTouch Delica Lancets 31V MISC Use as directed to check blood sugars 2 times per day dx:e11.65  . rosuvastatin (CRESTOR) 20 MG tablet Take 1 tablet (20 mg total) by mouth daily.  . temazepam (RESTORIL) 30 MG capsule One capsules po qhs prn  . [DISCONTINUED] amLODipine (NORVASC) 10 MG tablet Take 1 tablet (10 mg total) by mouth daily.  . [DISCONTINUED] aspirin EC 81 MG tablet Take 81 mg by mouth daily.   . [DISCONTINUED] cetirizine (ZYRTEC) 10 MG tablet Take 10 mg by mouth daily.   . [  DISCONTINUED] Cholecalciferol (VITAMIN D3) 1.25 MG (50000 UT) CAPS Take 1 capsule by mouth on Tuesday and Friday  . [DISCONTINUED] EDARBI 80 MG TABS TAKE 1 TABLET EACH DAY.  . [DISCONTINUED] insulin lispro (HUMALOG KWIKPEN) 100 UNIT/ML KwikPen Sliding scale  . [DISCONTINUED] pantoprazole  (PROTONIX) 40 MG tablet Take one tablet by mouth 3 days a week   No facility-administered encounter medications on file as of 11/27/2019.   Current Diagnosis/Assessment:  SDOH Interventions     Most Recent Value  SDOH Interventions  Financial Strain Interventions Intervention Not Indicated     Goals Addressed            This Visit's Progress   . Pharmacy Care Plan       CARE PLAN ENTRY  Current Barriers:  . Chronic Disease Management support, education, and care coordination needs related to Hypertension and Diabetes   Hypertension . Pharmacist Clinical Goal(s): o Over the next 180 days, patient will work with PharmD and providers to maintain BP goal <130/80 . Current regimen:  o Amlodipine 71m daily o Edarbi 8563mdaily o Hydrochlorothiazide 12.63m363maily . Interventions: o Provided dietary and exercise recommendations o Mailed diet recommendations . Patient self care activities - Over the next 45 days, patient will: o Check BP daily and if symptomatic, document, and provide at future appointments o Ensure daily salt intake < 2300 mg/day o Continue exercise regimen (at least 30 minutes of exercise 5 times weekly or 150 minutes per week) o Focus on healthy, balanced diet, limiting salt intake  Diabetes . Pharmacist Clinical Goal(s): o Over the next 45 days, patient will work with PharmD and providers to achieve A1c goal <7% . Current regimen:  o Toujeo Solostar 50 units daily o Ozempic 0.263m39mekly o Humalog per sliding scale . Interventions: o Discussed diet recommendations and meal planning in depth o Provided patient with carbohydrate counting and meal planning books at recent office visit o Discussed recent Hemoglobin A1c increase . Patient self care activities - Over the next 45 days, patient will: o Check blood sugar before meals, document, and provide at future appointments o Contact provider with any episodes of hypoglycemia o Continue exercise regimen (at  least 30 minutes of exercise 5 times weekly or 150 minutes per week) o Focus on a healthy, well-balanced diet limiting fried foods and sugary foods  Medication management . Pharmacist Clinical Goal(s): o Over the next 90 days, patient will work with PharmD and providers to maintain optimal medication adherence . Current pharmacy: [facility] . Interventions o Comprehensive medication review performed. o Utilize [facility] for medication synchronization, packaging and delivery . Patient self care activities - Over the next 45 days, patient will: o Focus on medication adherence by medication synchronization and using adherence packaging o Take medications as prescribed o Report any questions or concerns to PharmD and/or provider(s)  Please see past updates related to this goal by clicking on the "Past Updates" button in the selected goal         Diabetes   Recent Relevant Labs: Lab Results  Component Value Date/Time   HGBA1C 10.5 (H) 11/15/2019 10:29 AM   HGBA1C 9.6 (H) 08/13/2019 11:53 AM   MICROALBUR 150 11/15/2019 10:20 AM    Kidney Function Lab Results  Component Value Date/Time   CREATININE 1.11 (H) 11/15/2019 10:29 AM   CREATININE 1.52 (H) 10/06/2019 08:12 PM   CREATININE 0.74 08/30/2011 01:07 PM   GFR 77.09 11/17/2017 09:00 AM   GFRNONAA 52 (L) 11/15/2019 10:29  AM   GFRAA 60 11/15/2019 10:29 AM  Stage 3a CKD  Checking BG: Before meals  Recent FBG Readings: 95-97 Recent pre-meal BG readings:  Recent 2 hr PP readings: 150, 182, 215 Patient has failed these meds in past: Tresiba, Levemir, Novolog 70/30, Januvia, Farxiga (AKI) Patient is currently uncontrolled on the following medications:   Toujeo Solostar 50 units daily  Humalog sliding scale  Ozempic 0.95m once weekly  Last diabetic Foot exam: 11/15/19 Last diabetic Eye exam:  Lab Results  Component Value Date/Time   HMDIABEYEEXA No Retinopathy 08/02/2019 12:00 AM     We discussed:   . Diet extensively o Breakfast: Sausage or bacon, grits, egg o Lunch: Fried fish, burger, hot dog, tKuwaitsandwich o Dinner: Turnip greens, string beans, cabbage, spinach o Recommend limit fried foods, limit portions of carbohydrates o Recommend well-balanced meals . Exercise extensively o Going to the gym 3 times per week for an hour each time . Humalog: Pt has been using with lunch and dinner per sliding scale (3-6 units usually) o Has not had to use with breakfast . Pt is administering Ozempic on Tuesdays o Denies side effects . Why HgbA1c has increased since last visit  Plan Continue current medications   Hypertension   Office blood pressures are  BP Readings from Last 3 Encounters:  11/15/19 128/78  11/15/19 128/78  10/10/19 120/60   Patient has failed these meds in the past: Losartan, Olmesartan/HCTZ Patient is currently controlled on the following medications:   Amlodipine 141mdaily  Edarbi 8034maily  Hydrochlorothiazide 12.5mg79mily  Patient checks BP at home daily  Patient home BP readings are ranging: None provided today  Plan Continue current medications   Hyperlipidemia   Lipid Panel     Component Value Date/Time   CHOL 209 (H) 11/15/2019 1029   TRIG 73 11/15/2019 1029   HDL 87 11/15/2019 1029   CHOLHDL 2.4 11/15/2019 1029   CHOLHDL 2.8 09/13/2007 0405   VLDL 17 09/13/2007 0405   LDLCALC 109 (H) 11/15/2019 1029   LABVLDL 13 11/15/2019 1029    The 10-year ASCVD risk score (GofMikey BussingJr., et al., 2013) is: 20.9%   Values used to calculate the score:     Age: 56 y66rs     Sex: Female     Is Non-Hispanic African American: Yes     Diabetic: Yes     Tobacco smoker: No     Systolic Blood Pressure: 128 379g     Is BP treated: Yes     HDL Cholesterol: 87 mg/dL     Total Cholesterol: 209 mg/dL   Patient has failed these meds in past: N/A Patient is currently controlled on the following medications:   Rosuvastatin 20mg34mly  Aspirin 81mg 79mly  We discussed:  LDL has increased to 109 from 70  Pt has missed a few days of rosuvastatin Diet and exercise extensively Recommend limit fried foods and fatty foods  Plan Continue current medications  GERD   Patient has failed these meds in past: Aciphex, Zantac Patient is currently controlled on the following medications:   Pantoprazole 40mg d52m  Plan Continue current medications   Anemia   Hgb: 11.3 (RR 12-15 g/dL) on 01/25/19  Patient has failed these meds in past: N/A Patient is currently controlled on the following medications:   Ferrous gluconate 324mg tw55mdaily with a meal  Plan Continue current medications  Insomnia   Patient has failed these meds in past: N/A Patient is  currently controlled on the following medications:   Temazepam 41m at bedtime as needed  Plan Continue current medications  Allergies   Patient has failed these meds in past: N/A Patient is currently controlled on the following medications:   Cetirizine 162mdaily  Fluticasone nasal spray  Plan Continue current medications  Vaccines   Reviewed and discussed patient's vaccination history. No [facility] records available  Immunization History  Administered Date(s) Administered  . DTaP 03/26/2013  . Fluad Quad(high Dose 65+) 11/08/2018  . Influenza, High Dose Seasonal PF 11/08/2018  . Influenza-Unspecified 11/30/2017  . Moderna SARS-COVID-2 Vaccination 08/03/2019, 08/31/2019  . Td 08/20/2005   We discussed:  Pneumonia vaccines and Shingrix vaccine  Importance/indication, how often to get, benefit for patient  Plan Recommend both pneumonia vaccines (Pneumovax and Prevnar) and Shingrix at appropriate intervals  Medication Management   Pt uses [facility] for all medications Uses pill box? Yes Pt endorses 100% compliance  We discussed:   The importance of taking medications daily as directed  Packaging and delivery of medications is going well. Pt  reported no issues at this time. Next delivery scheduled this month  Plan Utilize [facility] for medication synchronization, packaging and delivery  Follow up: 6 week phone visit  CoJannette FogoPharmD Clinical Pharmacist [facility] [phone]

## 2019-11-30 ENCOUNTER — Telehealth: Payer: Medicare Other

## 2019-11-30 NOTE — Patient Instructions (Addendum)
Visit Information  Goals Addressed            This Visit's Progress   . Pharmacy Care Plan       CARE PLAN ENTRY  Current Barriers:  . Chronic Disease Management support, education, and care coordination needs related to Hypertension and Diabetes   Hypertension . Pharmacist Clinical Goal(s): o Over the next 45 days, patient will work with PharmD and providers to achieve BP goal <130/80 . Current regimen:  o Amlodipine 10mg  daily o Edarbi 80mg  daily o Hydrochlorothiazide 12.5mg  daily . Interventions: o Focus on healthy, balanced diet, limiting salt intake o Mailed diet recommendations o Edarbi and HCTZ held during hospitalization (6/17), but patient stated she resumed at discharged. Notified PCP that patient has been taking both medications since Sunday, 6/20.  Marland Kitchen Patient self care activities - Over the next 45 days, patient will: o Check BP daily and if symptomatic, document, and provide at future appointments o Ensure daily salt intake < 2300 mg/day o Continue exercise regimen  Diabetes . Pharmacist Clinical Goal(s): o Over the next 45 days, patient will work with PharmD and providers to achieve A1c goal <7% . Current regimen:  o Toujeo Solostar 50 units daily o Ozempic 0.25mg  weekly o Humalog per sliding scale . Interventions: o Discussed diet recommendations and meal planning in depth o Provided patient with carbohydrate counting and meal planning books in office o Counseled patient on proper use of Ozempic and to eat small meals o Provided patient with new sliding scale directions for Fiasp (changed to Humalog) - DIABETIC SLIDING SCALE (II) - IF BLOOD SUGAR IS:  LESS THAN 100 (NO INSULIN)  100-140 (3 UNITS)  141-180 (6 UNITS)  181-220 (9 UNITS)  221-260 (12 UNITS)  261-300 (15 UNITS)  301-340 (18 UNITS)  MORE THAN 341 (21 UNITS) . Patient self care activities - Over the next 45 days, patient will: o Check blood sugar before meals, document, and provide  at future appointments o Contact provider with any episodes of hypoglycemia  Medication management . Pharmacist Clinical Goal(s): o Over the next 45 days, patient will work with PharmD and providers to maintain optimal medication adherence . Current pharmacy: [facility] . Interventions o Comprehensive medication review performed. o Utilize [facility] for medication synchronization, packaging and delivery . Patient self care activities - Over the next 45 days, patient will: o Focus on medication adherence by medication synchronization and using adherence packaging o Take medications as prescribed o Report any questions or concerns to PharmD and/or provider(s)  Please see past updates related to this goal by clicking on the "Past Updates" button in the selected goal         The patient verbalized understanding of instructions provided today and agreed to receive a mailed copy of patient instruction and/or educational materials.  Telephone follow up appointment with pharmacy team member scheduled for: 11/27/19 @ 8:30 AM  Jannette Fogo, PharmD Clinical Pharmacist [facility] [phone]   Carbohydrate Counting for Diabetes Mellitus, Adult  Carbohydrate counting is a method of keeping track of how many carbohydrates you eat. Eating carbohydrates naturally increases the amount of sugar (glucose) in the blood. Counting how many carbohydrates you eat helps keep your blood glucose within normal limits, which helps you manage your diabetes (diabetes mellitus). It is important to know how many carbohydrates you can safely have in each meal. This is different for every person. A diet and nutrition specialist (registered dietitian) can help you make a meal  plan and calculate how many carbohydrates you should have at each meal and snack. Carbohydrates are found in the following foods:  Grains, such as breads and cereals.  Dried beans and soy  products.  Starchy vegetables, such as potatoes, peas, and corn.  Fruit and fruit juices.  Milk and yogurt.  Sweets and snack foods, such as cake, cookies, candy, chips, and soft drinks. How do I count carbohydrates? There are two ways to count carbohydrates in food. You can use either of the methods or a combination of both. Reading "Nutrition Facts" on packaged food The "Nutrition Facts" list is included on the labels of almost all packaged foods and beverages in the [place] It includes:  The serving size.  Information about nutrients in each serving, including the grams (g) of carbohydrate per serving. To use the "Nutrition Facts":  Decide how many servings you will have.  Multiply the number of servings by the number of carbohydrates per serving.  The resulting number is the total amount of carbohydrates that you will be having. Learning standard serving sizes of other foods When you eat carbohydrate foods that are not packaged or do not include "Nutrition Facts" on the label, you need to measure the servings in order to count the amount of carbohydrates:  Measure the foods that you will eat with a food scale or measuring cup, if needed.  Decide how many standard-size servings you will eat.  Multiply the number of servings by 15. Most carbohydrate-rich foods have about 15 g of carbohydrates per serving. ? For example, if you eat 8 oz (170 g) of strawberries, you will have eaten 2 servings and 30 g of carbohydrates (2 servings x 15 g = 30 g).  For foods that have more than one food mixed, such as soups and casseroles, you must count the carbohydrates in each food that is included. The following list contains standard serving sizes of common carbohydrate-rich foods. Each of these servings has about 15 g of carbohydrates:   hamburger bun or  English muffin.   oz (15 mL) syrup.   oz (14 g) jelly.  1 slice of bread.  1 six-inch tortilla.  3 oz (85 g) cooked rice or  pasta.  4 oz (113 g) cooked dried beans.  4 oz (113 g) starchy vegetable, such as peas, corn, or potatoes.  4 oz (113 g) hot cereal.  4 oz (113 g) mashed potatoes or  of a large baked potato.  4 oz (113 g) canned or frozen fruit.  4 oz (120 mL) fruit juice.  4-6 crackers.  6 chicken nuggets.  6 oz (170 g) unsweetened dry cereal.  6 oz (170 g) plain fat-free yogurt or yogurt sweetened with artificial sweeteners.  8 oz (240 mL) milk.  8 oz (170 g) fresh fruit or one small piece of fruit.  24 oz (680 g) popped popcorn. Example of carbohydrate counting Sample meal  3 oz (85 g) chicken breast.  6 oz (170 g) brown rice.  4 oz (113 g) corn.  8 oz (240 mL) milk.  8 oz (170 g) strawberries with sugar-free whipped topping. Carbohydrate calculation 1. Identify the foods that contain carbohydrates: ? Rice. ? Corn. ? Milk. ? Strawberries. 2. Calculate how many servings you have of each food: ? 2 servings rice. ? 1 serving corn. ? 1 serving milk. ? 1 serving strawberries. 3. Multiply each number of servings by 15 g: ? 2 servings rice x 15 g = 30 g. ? 1  serving corn x 15 g = 15 g. ? 1 serving milk x 15 g = 15 g. ? 1 serving strawberries x 15 g = 15 g. 4. Add together all of the amounts to find the total grams of carbohydrates eaten: ? 30 g + 15 g + 15 g + 15 g = 75 g of carbohydrates total. Summary  Carbohydrate counting is a method of keeping track of how many carbohydrates you eat.  Eating carbohydrates naturally increases the amount of sugar (glucose) in the blood.  Counting how many carbohydrates you eat helps keep your blood glucose within normal limits, which helps you manage your diabetes.  A diet and nutrition specialist (registered dietitian) can help you make a meal plan and calculate how many carbohydrates you should have at each meal and snack. This information is not intended to replace advice given to you by your health care provider. Make sure you  discuss any questions you have with your health care provider. Document Revised: 09/30/2016 Document Reviewed: 08/20/2015 Elsevier Patient Education  [place].

## 2019-12-12 ENCOUNTER — Other Ambulatory Visit: Payer: Self-pay

## 2019-12-12 ENCOUNTER — Telehealth: Payer: Self-pay

## 2019-12-12 ENCOUNTER — Encounter: Payer: Self-pay | Admitting: Nurse Practitioner

## 2019-12-12 ENCOUNTER — Telehealth (INDEPENDENT_AMBULATORY_CARE_PROVIDER_SITE_OTHER): Payer: Medicare Other | Admitting: Nurse Practitioner

## 2019-12-12 DIAGNOSIS — R05 Cough: Secondary | ICD-10-CM | POA: Diagnosis not present

## 2019-12-12 DIAGNOSIS — J069 Acute upper respiratory infection, unspecified: Secondary | ICD-10-CM | POA: Diagnosis not present

## 2019-12-12 DIAGNOSIS — R059 Cough, unspecified: Secondary | ICD-10-CM

## 2019-12-12 MED ORDER — PANTOPRAZOLE SODIUM 40 MG PO TBEC
DELAYED_RELEASE_TABLET | ORAL | 3 refills | Status: DC
Start: 2019-12-12 — End: 2019-12-12

## 2019-12-12 MED ORDER — BENZONATATE 100 MG PO CAPS: 100.0000 mg | ORAL_CAPSULE | Freq: Four times a day (QID) | ORAL | 1 refills | Status: DC | PRN

## 2019-12-12 MED ORDER — PANTOPRAZOLE SODIUM 40 MG PO TBEC: DELAYED_RELEASE_TABLET | ORAL | 2 refills | Status: DC

## 2019-12-12 MED ORDER — EDARBI 80 MG PO TABS: ORAL_TABLET | ORAL | 2 refills | Status: DC

## 2019-12-12 MED ORDER — INSULIN LISPRO (1 UNIT DIAL) 100 UNIT/ML (KWIKPEN): PEN_INJECTOR | SUBCUTANEOUS | 2 refills | Status: DC

## 2019-12-12 MED ORDER — VITAMIN D3 1.25 MG (50000 UT) PO CAPS: ORAL_CAPSULE | ORAL | 1 refills | Status: DC

## 2019-12-12 MED ORDER — ASPIRIN EC 81 MG PO TBEC
81.0000 mg | DELAYED_RELEASE_TABLET | Freq: Every day | ORAL | 2 refills | Status: DC
Start: 2019-12-12 — End: 2020-08-28

## 2019-12-12 MED ORDER — AMLODIPINE BESYLATE 10 MG PO TABS
10.0000 mg | ORAL_TABLET | Freq: Every day | ORAL | 2 refills | Status: DC
Start: 2019-12-12 — End: 2019-12-13

## 2019-12-12 MED ORDER — CETIRIZINE HCL 10 MG PO TABS
10.0000 mg | ORAL_TABLET | Freq: Every day | ORAL | 2 refills | Status: DC
Start: 2019-12-12 — End: 2021-09-16

## 2019-12-12 NOTE — Progress Notes (Deleted)
I,Leslie Davis [employer] as a Education administrator for [employer], FNP.,have documented all relevant documentation on the behalf of [name], FNP,as directed by  [name], FNP while in the presence of [name], [place]. This visit occurred during the SARS-CoV-2 public health emergency.  Safety protocols were in place, including screening questions prior to the visit, additional usage of staff PPE, and extensive cleaning of exam room while observing appropriate contact time as indicated for disinfecting solutions.  Subjective:     Patient ID: Leslie Davis , female    DOB: 10/03/1955 , 64 y.o.   MRN: [id number]   Chief Complaint  Patient presents with  . URI    patient stated her symptoms started yesterday     HPI  She has not been tested   URI  This is a new problem. The current episode started yesterday. Associated symptoms include congestion, coughing (dry cough), ear pain (used sweet oil) and headaches. Pertinent negatives include no chest pain or wheezing. She has tried nothing for the symptoms.     Past Medical History:  Diagnosis Date  . DM (diabetes mellitus) ([place])   . Fatty liver disease, nonalcoholic   . Gastritis   . Gastroparesis   . GERD (gastroesophageal reflux disease)   . Hiatal hernia   . Hiatal hernia   . HLD (hyperlipidemia)   . HTN (hypertension)   . Renal calculus      Family History  Problem Relation Age of Onset  . Diabetes Mother   . Diabetes Maternal Grandmother   . Stomach cancer Maternal Aunt        X 2 aunts  . Hypertension Father   . Colon cancer Neg Hx   . Esophageal cancer Neg Hx   . Rectal cancer Neg Hx      Current Outpatient Medications:  .  amLODipine (NORVASC) 10 MG tablet, Take 1 tablet (10 mg total) by mouth daily., Disp: 90 tablet, Rfl: 1 .  aspirin EC 81 MG tablet, Take 81 mg by mouth daily. , Disp: , Rfl:  .  Blood Glucose Monitoring Suppl (ONETOUCH VERIO) w/Device KIT, Use as directed to check blood sugars 2 times per  day dx: e11.65, Disp: 1 kit, Rfl: 1 .  cetirizine (ZYRTEC) 10 MG tablet, Take 10 mg by mouth daily. , Disp: , Rfl:  .  Cholecalciferol (VITAMIN D3) 1.25 MG (50000 UT) CAPS, Take 1 capsule by mouth on Tuesday and Friday, Disp: 24 capsule, Rfl: 1 .  Continuous Blood Gluc Receiver (FREESTYLE LIBRE 14 DAY READER) DEVI, by Does not apply route., Disp: , Rfl:  .  Continuous Blood Gluc Sensor (FREESTYLE LIBRE 14 DAY SENSOR) MISC, Use as directed to check blood sugars 4 times per day dx: e11.65, Disp: 2 each, Rfl: 4 .  EDARBI 80 MG TABS, TAKE 1 TABLET EACH DAY., Disp: 90 tablet, Rfl: 1 .  ferrous gluconate (FERGON) 324 MG tablet, TAKE 1 TABLET BY MOUTH TWICE DAILY WITH A MEAL., Disp: 100 tablet, Rfl: 0 .  glucose blood (ONETOUCH VERIO) test strip, Use as instructed to check blood sugars 2 times per day dx:e11.65, Disp: 150 each, Rfl: 3 .  insulin glargine, 1 Unit Dial, (TOUJEO SOLOSTAR) 300 UNIT/ML Solostar Pen, Inject 50 units subcutaneous at bedtime, Disp: 22.5 mL, Rfl: 1 .  insulin lispro (HUMALOG KWIKPEN) 100 UNIT/ML KwikPen, Sliding scale, Disp: 15 mL, Rfl: 2 .  Insulin Pen Needle 32G X 6 MM MISC, Use with pen injectors daily dx code: e11.9, Disp: 150 each,  Rfl: 3 .  OneTouch Delica Lancets 93J MISC, Use as directed to check blood sugars 2 times per day dx:e11.65, Disp: 150 each, Rfl: 3 .  pantoprazole (PROTONIX) 40 MG tablet, Take one tablet by mouth 3 days a week, Disp: 30 tablet, Rfl: 3 .  rosuvastatin (CRESTOR) 20 MG tablet, Take 1 tablet (20 mg total) by mouth daily., Disp: 90 tablet, Rfl: 1 .  temazepam (RESTORIL) 30 MG capsule, One capsules po qhs prn, Disp: 60 capsule, Rfl: 0   Allergies  Allergen Reactions  . Fiasp [Insulin Aspart (W-Niacinamide)] Hives     Review of Systems  Constitutional: Negative for fatigue and fever.  HENT: Positive for congestion and ear pain (used sweet oil).   Respiratory: Positive for cough (dry cough). Negative for shortness of breath and wheezing.    Cardiovascular: Negative.  Negative for chest pain and leg swelling.  Neurological: Positive for headaches. Negative for dizziness.  Psychiatric/Behavioral: Negative.      There were no vitals filed for this visit. There is no height or weight on file to calculate BMI.   Objective:  Physical Exam Constitutional:      General: She is not in acute distress.    Appearance: Normal appearance.  Neurological:     Mental Status: She is alert.         Assessment And Plan:     There are no diagnoses linked to this encounter.    Patient was given opportunity to ask questions. Patient verbalized understanding of the plan and was able to repeat key elements of the plan. All questions were answered to their satisfaction.  [address] [place], CMA   I, [place], [place], have reviewed all documentation for this visit. The documentation on 12/12/19 for the exam, diagnosis, procedures, and orders are all accurate and complete.  THE PATIENT IS ENCOURAGED TO PRACTICE SOCIAL DISTANCING DUE TO THE COVID-19 PANDEMIC.

## 2019-12-12 NOTE — Chronic Care Management (AMB) (Addendum)
Chronic Care Management Pharmacy Assistant   Name: Leslie Davis  MRN: [id number] DOB: 09/04/1955  Reason for Encounter: Medication Review/ Medication Dispensing Call.  PCP : [name], MD  Allergies:   Allergies  Allergen Reactions   Fiasp [Insulin Aspart (W-Niacinamide)] Hives    Medications: Outpatient Encounter Medications as of 12/12/2019  Medication Sig   amLODipine (NORVASC) 10 MG tablet Take 1 tablet (10 mg total) by mouth daily.   aspirin EC 81 MG tablet Take 81 mg by mouth daily.    Blood Glucose Monitoring Suppl (ONETOUCH VERIO) w/Device KIT Use as directed to check blood sugars 2 times per day dx: e11.65   cetirizine (ZYRTEC) 10 MG tablet Take 10 mg by mouth daily.    Cholecalciferol (VITAMIN D3) 1.25 MG (50000 UT) CAPS Take 1 capsule by mouth on Tuesday and Friday   Continuous Blood Gluc Receiver (FREESTYLE LIBRE 14 DAY READER) DEVI by Does not apply route.   Continuous Blood Gluc Sensor (FREESTYLE LIBRE 14 DAY SENSOR) MISC Use as directed to check blood sugars 4 times per day dx: e11.65   EDARBI 80 MG TABS TAKE 1 TABLET EACH DAY.   ferrous gluconate (FERGON) 324 MG tablet TAKE 1 TABLET BY MOUTH TWICE DAILY WITH A MEAL.   glucose blood (ONETOUCH VERIO) test strip Use as instructed to check blood sugars 2 times per day dx:e11.65   insulin glargine, 1 Unit Dial, (TOUJEO SOLOSTAR) 300 UNIT/ML Solostar Pen Inject 50 units subcutaneous at bedtime   insulin lispro (HUMALOG KWIKPEN) 100 UNIT/ML KwikPen Sliding scale   Insulin Pen Needle 32G X 6 MM MISC Use with pen injectors daily dx code: Z66.0   OneTouch Delica Lancets 63K MISC Use as directed to check blood sugars 2 times per day dx:e11.65   pantoprazole (PROTONIX) 40 MG tablet Take one tablet by mouth 3 days a week   rosuvastatin (CRESTOR) 20 MG tablet Take 1 tablet (20 mg total) by mouth daily.   temazepam (RESTORIL) 30 MG capsule One capsules po qhs prn   No facility-administered encounter medications on  file as of 12/12/2019.    Current Diagnosis: Patient Active Problem List   Diagnosis Date Noted   AKI (acute kidney injury) ([place]) 09/07/2019   Acute kidney injury ([place]) 09/06/2019   Bradycardia 09/06/2019   Hyperglycemia 09/06/2019   HLD (hyperlipidemia)    Hypertensive nephropathy 05/16/2019   Erosive gastritis 11/16/2017   Fatty liver 11/16/2017   Abdominal pain 12/11/2012   Early satiety 12/11/2012   Hiatal hernia    Hx of cholecystectomy 02/05/2011   Flu-like symptoms 02/05/2011   CONSTIPATION 08/27/2008   FATTY LIVER DISEASE 05/07/2008   Nausea with vomiting 04/23/2008   EPIGASTRIC PAIN 04/23/2008   RENAL CALCULUS 04/22/2008   Type 2 diabetes mellitus with stage 2 chronic kidney disease, with long-term current use of insulin ([place]) 05/19/2006   Type 2 diabetes mellitus with hyperlipidemia ([place]) 05/19/2006   HYPERTENSION, BENIGN SYSTEMIC 05/19/2006   GASTROESOPHAGEAL REFLUX, NO ESOPHAGITIS 05/19/2006   AMENORRHEA 05/19/2006    Goals Addressed   None    Reviewed chart for medication changes ahead of medication coordination call.  OV- 11/27/19- Jannette Fogo, CPP (CCM Pharmacist) since last care coordination call.   No medication changes indicated.  BP Readings from Last 3 Encounters:  11/15/19 128/78  11/15/19 128/78  10/10/19 120/60    Lab Results  Component Value Date   HGBA1C 10.5 (H) 11/15/2019     Patient obtains medications through Vials  90 Days  Last adherence delivery included: Onetouch Verio Test Strips- use to test blood sugars twice a day sent on 12/01/19, as of 11/22/19 adherence call patient was not in need of any other medications.  Patient is due for next adherence delivery on: 12/13/19.  Called patient and reviewed medications and coordinated delivery.  This delivery to include: Humalog Kwikpen 100 units/ml Inject as directed per sliding scale, Amlodipine 10 mg 1 tablet daily, Rosuvastatin 20 mg 1 tablet daily, Cetirizine 10 mg 1 tablet  daily, Pantoprazole 40 mg 1 tablet three times weekly, Vitamin D 50000 units 1 capsule Tuesday and Friday, ASA 81 mg 1 tablet daily, Edarbi 80 mg 1 tablet daily.   Patient declined the following medications Onetouch Verio Test Strips- use to test blood sugars twice a day due to receiving a 90 day supply on 11/28/19, Toujeo 50 un sq nightly due to receiving a 456 day supply on 10/31/19, Clindamycin lotion 11% due to using as needed, Ferrous Gluconate 324 mg 1 tablet twice daily due to receiving a 90 day supply on 09/13/19, and Flonase NS 1-2 sprays in each nostril daily due to using as needed.   Patient needs refills for: Humalog Kwikpen 100 units/ml Inject as directed per sliding scale, Amlodipine 10 mg 1 tablet daily, Rosuvastatin 20 mg 1 tablet daily, Cetirizine 10 mg 1 tablet daily, Pantoprazole 40 mg 1 tablet three times weekly, Vitamin D 50000 units 1 capsule Tuesday and Friday, ASA 81 mg 1 tablet daily, Edarbi 80 mg 1 tablet daily.  Confirmed delivery date of 12/13/2019, advised patient that pharmacy will contact them the morning of delivery.  Follow-Up:  Coordination of Enhanced Pharmacy Services and Pharmacist Review- Patient complained of a head cold and cough, started yesterday, patient denies Covid exposure, she states she get this yearly and is requesting a cough syrup from Dr Baird Cancer (PCP). Patient aware I will relay message to Jannette Fogo, CPP to discuss with PCP.  Jannette Fogo, CPP notified of patient complaints.  Pattricia Boss, [place] Pharmacist Assistant [phone]

## 2019-12-12 NOTE — Telephone Encounter (Signed)
..   Pt understands that although there may be some limitations with this type of visit, we will take all precautions to reduce any security or privacy concerns.  Pt understands that this will be treated like an in office visit and we will file with pt's insurance, and there may be a patient responsible charge related to this service. ? ?

## 2019-12-12 NOTE — Progress Notes (Signed)
Virtual Visit via Telephone   This visit type was conducted due to national recommendations for restrictions regarding the COVID-19 Pandemic (e.g. social distancing) in an effort to limit this patient's exposure and mitigate transmission in our community.  Due to her co-morbid illnesses, this patient is at least at moderate risk for complications without adequate follow up.  This format is felt to be most appropriate for this patient at this time.  All issues noted in this document were discussed and addressed.  A limited physical exam was performed with this format.    This visit type was conducted due to national recommendations for restrictions regarding the COVID-19 Pandemic (e.g. social distancing) in an effort to limit this patient's exposure and mitigate transmission in our community.  Patients identity confirmed using two different identifiers.  This format is felt to be most appropriate for this patient at this time.  All issues noted in this document were discussed and addressed.  No physical exam was performed (except for noted visual exam findings with Video Visits).    Date:  12/12/2019   ID:  Meela, [place] 1955/08/25, MRN [id number]  Patient Location:  Home - spoke to [facility] [place]  Provider location:   Office    Chief Complaint:  New cough and upper respiratory symptoms.  History of Present Illness:    ANEDRA PENAFIEL is a 64 y.o. female who presents via video conferencing for a telehealth visit today.    The patient does have symptoms concerning for COVID-19 infection (fever, chills, cough, or new shortness of breath).   URI  Associated symptoms include coughing, ear pain and headaches. Pertinent negatives include no wheezing.     Past Medical History:  Diagnosis Date  . DM (diabetes mellitus) ([place])   . Fatty liver disease, nonalcoholic   . Gastritis   . Gastroparesis   . GERD (gastroesophageal reflux disease)   . Hiatal hernia   . Hiatal hernia    . HLD (hyperlipidemia)   . HTN (hypertension)   . Renal calculus    Past Surgical History:  Procedure Laterality Date  . CHOLECYSTECTOMY    . COLONOSCOPY    . TUBAL LIGATION    . UPPER GASTROINTESTINAL ENDOSCOPY       Current Meds  Medication Sig  . amLODipine (NORVASC) 10 MG tablet Take 1 tablet (10 mg total) by mouth daily.  Marland Kitchen aspirin EC 81 MG tablet Take 81 mg by mouth daily.   . Blood Glucose Monitoring Suppl (ONETOUCH VERIO) w/Device KIT Use as directed to check blood sugars 2 times per day dx: e11.65  . cetirizine (ZYRTEC) 10 MG tablet Take 10 mg by mouth daily.   . Cholecalciferol (VITAMIN D3) 1.25 MG (50000 UT) CAPS Take 1 capsule by mouth on Tuesday and Friday  . Continuous Blood Gluc Receiver (FREESTYLE LIBRE 14 DAY READER) DEVI by Does not apply route.  . Continuous Blood Gluc Sensor (FREESTYLE LIBRE 14 DAY SENSOR) MISC Use as directed to check blood sugars 4 times per day dx: e11.65  . EDARBI 80 MG TABS TAKE 1 TABLET EACH DAY.  . ferrous gluconate (FERGON) 324 MG tablet TAKE 1 TABLET BY MOUTH TWICE DAILY WITH A MEAL.  Marland Kitchen glucose blood (ONETOUCH VERIO) test strip Use as instructed to check blood sugars 2 times per day dx:e11.65  . insulin glargine, 1 Unit Dial, (TOUJEO SOLOSTAR) 300 UNIT/ML Solostar Pen Inject 50 units subcutaneous at bedtime  . insulin lispro (HUMALOG KWIKPEN) 100 UNIT/ML KwikPen Sliding scale  .  Insulin Pen Needle 32G X 6 MM MISC Use with pen injectors daily dx code: e11.9  . OneTouch Delica Lancets 18H MISC Use as directed to check blood sugars 2 times per day dx:e11.65  . pantoprazole (PROTONIX) 40 MG tablet Take one tablet by mouth 3 days a week  . rosuvastatin (CRESTOR) 20 MG tablet Take 1 tablet (20 mg total) by mouth daily.  . temazepam (RESTORIL) 30 MG capsule One capsules po qhs prn     Allergies:   Fiasp [insulin aspart (w-niacinamide)]   Social History   Tobacco Use  . Smoking status: Former Smoker    Packs/day: 0.25    Years: 1.00     Pack years: 0.25    Quit date: 09/10/1974    Years since quitting: 45.2  . Smokeless tobacco: Never Used  . Tobacco comment: she no longer smokes.   Vaping Use  . Vaping Use: Never used  Substance Use Topics  . Alcohol use: No  . Drug use: No     Family Hx: The patient's family history includes Diabetes in her maternal grandmother and mother; Hypertension in her father; Stomach cancer in her maternal aunt. There is no history of Colon cancer, Esophageal cancer, or Rectal cancer.  ROS:   Please see the history of present illness.    Review of Systems  Constitutional: Negative.   HENT: Positive for ear pain.   Respiratory: Positive for cough. Negative for shortness of breath and wheezing.   Cardiovascular: Negative.   Neurological: Positive for headaches. Negative for dizziness.  Psychiatric/Behavioral: Negative.     All other systems reviewed and are negative.   Labs/Other Tests and Data Reviewed:    Recent Labs: 05/16/2019: TSH 1.250 09/11/2019: Magnesium 2.6 11/15/2019: ALT 47; BUN 22; Creatinine, Ser 1.11; Hemoglobin 12.9; Platelets 287; Potassium 4.8; Sodium 141   Recent Lipid Panel Lab Results  Component Value Date/Time   CHOL 209 (H) 11/15/2019 10:29 AM   TRIG 73 11/15/2019 10:29 AM   HDL 87 11/15/2019 10:29 AM   CHOLHDL 2.4 11/15/2019 10:29 AM   CHOLHDL 2.8 09/13/2007 04:05 AM   LDLCALC 109 (H) 11/15/2019 10:29 AM    Wt Readings from Last 3 Encounters:  11/15/19 209 lb (94.8 kg)  11/15/19 209 lb 6.4 oz (95 kg)  10/10/19 209 lb 9.6 oz (95.1 kg)     Exam:    Vital Signs:  There were no vitals taken for this visit.    Physical Exam Constitutional:      General: She is not in acute distress.    Appearance: Normal appearance.  Pulmonary:     Effort: Pulmonary effort is normal. No respiratory distress.  Skin:    General: Skin is warm and dry.     Capillary Refill: Capillary refill takes less than 2 seconds.  Neurological:     Mental Status: She is alert  and oriented to person, place, and time.     Cranial Nerves: No cranial nerve deficit.  Psychiatric:        Mood and Affect: Mood normal.        Behavior: Behavior normal.        Thought Content: Thought content normal.        Judgment: Judgment normal.     ASSESSMENT & PLAN:    1. Cough  One day history of dry cough   Will send tessalon perles as needed  She is advised if she worsens with shortness of breath or chest pain to go to ER  for further evaluation - benzonatate (TESSALON PERLES) 100 MG capsule; Take 1 capsule (100 mg total) by mouth every 6 (six) hours as needed for cough.  Dispense: 30 capsule; Refill: 1  2. Viral upper respiratory tract infection  I have advised her to go to [facility] or [facility] to have a covid  - benzonatate (TESSALON PERLES) 100 MG capsule; Take 1 capsule (100 mg total) by mouth every 6 (six) hours as needed for cough.  Dispense: 30 capsule; Refill: 1   COVID-19 Education: The signs and symptoms of COVID-19 were discussed with the patient and how to seek care for testing (follow up with PCP or arrange E-visit).  The importance of social distancing was discussed today.  Patient Risk:   After full review of this patients clinical status, I feel that they are at least moderate risk at this time.  Time:   Today, I have spent 11 minutes/ seconds with the patient with telehealth technology discussing above diagnoses.     Medication Adjustments/Labs and Tests Ordered: Current medicines are reviewed at length with the patient today.  Concerns regarding medicines are outlined above.   Tests Ordered: No orders of the defined types were placed in this encounter.   Medication Changes: Meds ordered this encounter  Medications  . benzonatate (TESSALON PERLES) 100 MG capsule    Sig: Take 1 capsule (100 mg total) by mouth every 6 (six) hours as needed for cough.    Dispense:  30 capsule    Refill:  1    Disposition:  Follow up prn  Signed, [name], FNP

## 2019-12-13 ENCOUNTER — Other Ambulatory Visit: Payer: Self-pay

## 2019-12-13 MED ORDER — AMLODIPINE BESYLATE 10 MG PO TABS
10.0000 mg | ORAL_TABLET | Freq: Every day | ORAL | 2 refills | Status: DC
Start: 2019-12-13 — End: 2020-08-29

## 2019-12-13 MED ORDER — EDARBI 80 MG PO TABS: ORAL_TABLET | ORAL | 2 refills | Status: DC

## 2019-12-13 MED ORDER — INSULIN LISPRO (1 UNIT DIAL) 100 UNIT/ML (KWIKPEN): PEN_INJECTOR | SUBCUTANEOUS | 2 refills | Status: DC

## 2019-12-13 NOTE — Chronic Care Management (AMB) (Signed)
   Chronic Care Management Pharmacy  Name: Leslie Davis  MRN: [id number] DOB: Jun 18, 1955  PCP : [name], MD  Review of medications prior to upcoming adherence delivery.  Follow up: 8/20 phone visit  Jannette Fogo, PharmD Clinical Pharmacist [facility] [phone]

## 2019-12-13 NOTE — Patient Instructions (Signed)
Carbohydrate Counting for Diabetes Mellitus, Adult  Carbohydrate counting is a method of keeping track of how many carbohydrates you eat. Eating carbohydrates naturally increases the amount of sugar (glucose) in the blood. Counting how many carbohydrates you eat helps keep your blood glucose within normal limits, which helps you manage your diabetes (diabetes mellitus). It is important to know how many carbohydrates you can safely have in each meal. This is different for every person. A diet and nutrition specialist (registered dietitian) can help you make a meal plan and calculate how many carbohydrates you should have at each meal and snack. Carbohydrates are found in the following foods:  Grains, such as breads and cereals.  Dried beans and soy products.  Starchy vegetables, such as potatoes, peas, and corn.  Fruit and fruit juices.  Milk and yogurt.  Sweets and snack foods, such as cake, cookies, candy, chips, and soft drinks. How do I count carbohydrates? There are two ways to count carbohydrates in food. You can use either of the methods or a combination of both. Reading "Nutrition Facts" on packaged food The "Nutrition Facts" list is included on the labels of almost all packaged foods and beverages in the [place] It includes:  The serving size.  Information about nutrients in each serving, including the grams (g) of carbohydrate per serving. To use the "Nutrition Facts":  Decide how many servings you will have.  Multiply the number of servings by the number of carbohydrates per serving.  The resulting number is the total amount of carbohydrates that you will be having. Learning standard serving sizes of other foods When you eat carbohydrate foods that are not packaged or do not include "Nutrition Facts" on the label, you need to measure the servings in order to count the amount of carbohydrates:  Measure the foods that you will eat with a food scale or measuring cup, if  needed.  Decide how many standard-size servings you will eat.  Multiply the number of servings by 15. Most carbohydrate-rich foods have about 15 g of carbohydrates per serving. ? For example, if you eat 8 oz (170 g) of strawberries, you will have eaten 2 servings and 30 g of carbohydrates (2 servings x 15 g = 30 g).  For foods that have more than one food mixed, such as soups and casseroles, you must count the carbohydrates in each food that is included. The following list contains standard serving sizes of common carbohydrate-rich foods. Each of these servings has about 15 g of carbohydrates:   hamburger bun or  English muffin.   oz (15 mL) syrup.   oz (14 g) jelly.  1 slice of bread.  1 six-inch tortilla.  3 oz (85 g) cooked rice or pasta.  4 oz (113 g) cooked dried beans.  4 oz (113 g) starchy vegetable, such as peas, corn, or potatoes.  4 oz (113 g) hot cereal.  4 oz (113 g) mashed potatoes or  of a large baked potato.  4 oz (113 g) canned or frozen fruit.  4 oz (120 mL) fruit juice.  4-6 crackers.  6 chicken nuggets.  6 oz (170 g) unsweetened dry cereal.  6 oz (170 g) plain fat-free yogurt or yogurt sweetened with artificial sweeteners.  8 oz (240 mL) milk.  8 oz (170 g) fresh fruit or one small piece of fruit.  24 oz (680 g) popped popcorn. Example of carbohydrate counting Sample meal  3 oz (85 g) chicken breast.  6 oz (170 g)   brown rice.  4 oz (113 g) corn.  8 oz (240 mL) milk.  8 oz (170 g) strawberries with sugar-free whipped topping. Carbohydrate calculation 1. Identify the foods that contain carbohydrates: ? Rice. ? Corn. ? Milk. ? Strawberries. 2. Calculate how many servings you have of each food: ? 2 servings rice. ? 1 serving corn. ? 1 serving milk. ? 1 serving strawberries. 3. Multiply each number of servings by 15 g: ? 2 servings rice x 15 g = 30 g. ? 1 serving corn x 15 g = 15 g. ? 1 serving milk x 15 g = 15 g. ? 1  serving strawberries x 15 g = 15 g. 4. Add together all of the amounts to find the total grams of carbohydrates eaten: ? 30 g + 15 g + 15 g + 15 g = 75 g of carbohydrates total. Summary  Carbohydrate counting is a method of keeping track of how many carbohydrates you eat.  Eating carbohydrates naturally increases the amount of sugar (glucose) in the blood.  Counting how many carbohydrates you eat helps keep your blood glucose within normal limits, which helps you manage your diabetes.  A diet and nutrition specialist (registered dietitian) can help you make a meal plan and calculate how many carbohydrates you should have at each meal and snack. This information is not intended to replace advice given to you by your health care provider. Make sure you discuss any questions you have with your health care provider. Document Revised: 09/30/2016 Document Reviewed: 08/20/2015 Elsevier Patient Education  2020 [employer].  

## 2019-12-17 ENCOUNTER — Telehealth: Payer: Self-pay

## 2019-12-17 NOTE — Telephone Encounter (Signed)
The pt called to let Laurance Flatten, FNP-BC that her covid test came back positive.

## 2019-12-18 NOTE — Patient Instructions (Addendum)
Visit Information  Goals Addressed            This Visit's Progress   . Pharmacy Care Plan       CARE PLAN ENTRY  Current Barriers:  . Chronic Disease Management support, education, and care coordination needs related to Hypertension and Diabetes   Hypertension . Pharmacist Clinical Goal(s): o Over the next 180 days, patient will work with PharmD and providers to maintain BP goal <130/80 . Current regimen:  o Amlodipine 10mg  daily o Edarbi 80mg  daily o Hydrochlorothiazide 12.5mg  daily . Interventions: o Provided dietary and exercise recommendations o Mailed diet recommendations . Patient self care activities - Over the next 45 days, patient will: o Check BP daily and if symptomatic, document, and provide at future appointments o Ensure daily salt intake < 2300 mg/day o Continue exercise regimen (at least 30 minutes of exercise 5 times weekly or 150 minutes per week) o Focus on healthy, balanced diet, limiting salt intake  Diabetes . Pharmacist Clinical Goal(s): o Over the next 45 days, patient will work with PharmD and providers to achieve A1c goal <7% . Current regimen:  o Toujeo Solostar 50 units daily o Ozempic 0.25mg  weekly o Humalog per sliding scale . Interventions: o Discussed diet recommendations and meal planning in depth o Provided patient with carbohydrate counting and meal planning books at recent office visit o Discussed recent Hemoglobin A1c increase . Patient self care activities - Over the next 45 days, patient will: o Check blood sugar before meals, document, and provide at future appointments o Contact provider with any episodes of hypoglycemia o Continue exercise regimen (at least 30 minutes of exercise 5 times weekly or 150 minutes per week) o Focus on a healthy, well-balanced diet limiting fried foods and sugary foods  Medication management . Pharmacist Clinical Goal(s): o Over the next 90 days, patient will work with PharmD and providers to  maintain optimal medication adherence . Current pharmacy: [facility] . Interventions o Comprehensive medication review performed. o Utilize [facility] for medication synchronization, packaging and delivery . Patient self care activities - Over the next 45 days, patient will: o Focus on medication adherence by medication synchronization and using adherence packaging o Take medications as prescribed o Report any questions or concerns to PharmD and/or provider(s)  Please see past updates related to this goal by clicking on the "Past Updates" button in the selected goal         The patient verbalized understanding of instructions provided today and agreed to receive a mailed copy of patient instruction and/or educational materials.  Telephone follow up appointment with pharmacy team member scheduled for: 01/08/20 @ 11:30 AM  Jannette Fogo, PharmD Clinical Pharmacist [facility] [phone]  Diabetes Mellitus and Nutrition, Adult When you have diabetes (diabetes mellitus), it is very important to have healthy eating habits because your blood sugar (glucose) levels are greatly affected by what you eat and drink. Eating healthy foods in the appropriate amounts, at about the same times every day, can help you:  Control your blood glucose.  Lower your risk of heart disease.  Improve your blood pressure.  Reach or maintain a healthy weight. Every person with diabetes is different, and each person has different needs for a meal plan. Your health care provider may recommend that you work with a diet and nutrition specialist (dietitian) to make a meal plan that is best for you. Your meal plan may vary depending on factors such as:  The calories  you need.  The medicines you take.  Your weight.  Your blood glucose, blood pressure, and cholesterol levels.  Your activity level.  Other health conditions you have, such as heart or kidney  disease. How do carbohydrates affect me? Carbohydrates, also called carbs, affect your blood glucose level more than any other type of food. Eating carbs naturally raises the amount of glucose in your blood. Carb counting is a method for keeping track of how many carbs you eat. Counting carbs is important to keep your blood glucose at a healthy level, especially if you use insulin or take certain oral diabetes medicines. It is important to know how many carbs you can safely have in each meal. This is different for every person. Your dietitian can help you calculate how many carbs you should have at each meal and for each snack. Foods that contain carbs include:  Bread, cereal, rice, pasta, and crackers.  Potatoes and corn.  Peas, beans, and lentils.  Milk and yogurt.  Fruit and juice.  Desserts, such as cakes, cookies, ice cream, and candy. How does alcohol affect me? Alcohol can cause a sudden decrease in blood glucose (hypoglycemia), especially if you use insulin or take certain oral diabetes medicines. Hypoglycemia can be a life-threatening condition. Symptoms of hypoglycemia (sleepiness, dizziness, and confusion) are similar to symptoms of having too much alcohol. If your health care provider says that alcohol is safe for you, follow these guidelines:  Limit alcohol intake to no more than 1 drink per day for nonpregnant women and 2 drinks per day for men. One drink equals 12 oz of beer, 5 oz of wine, or 1 oz of hard liquor.  Do not drink on an empty stomach.  Keep yourself hydrated with water, diet soda, or unsweetened iced tea.  Keep in mind that regular soda, juice, and other mixers may contain a lot of sugar and must be counted as carbs. What are tips for following this plan?  Reading food labels  Start by checking the serving size on the "Nutrition Facts" label of packaged foods and drinks. The amount of calories, carbs, fats, and other nutrients listed on the label is based  on one serving of the item. Many items contain more than one serving per package.  Check the total grams (g) of carbs in one serving. You can calculate the number of servings of carbs in one serving by dividing the total carbs by 15. For example, if a food has 30 g of total carbs, it would be equal to 2 servings of carbs.  Check the number of grams (g) of saturated and trans fats in one serving. Choose foods that have low or no amount of these fats.  Check the number of milligrams (mg) of salt (sodium) in one serving. Most people should limit total sodium intake to less than 2,300 mg per day.  Always check the nutrition information of foods labeled as "low-fat" or "nonfat". These foods may be higher in added sugar or refined carbs and should be avoided.  Talk to your dietitian to identify your daily goals for nutrients listed on the label. Shopping  Avoid buying canned, premade, or processed foods. These foods tend to be high in fat, sodium, and added sugar.  Shop around the outside edge of the grocery store. This includes fresh fruits and vegetables, bulk grains, fresh meats, and fresh dairy. Cooking  Use low-heat cooking methods, such as baking, instead of high-heat cooking methods like deep frying.  Cook using  healthy oils, such as olive, canola, or sunflower oil.  Avoid cooking with butter, cream, or high-fat meats. Meal planning  Eat meals and snacks regularly, preferably at the same times every day. Avoid going long periods of time without eating.  Eat foods high in fiber, such as fresh fruits, vegetables, beans, and whole grains. Talk to your dietitian about how many servings of carbs you can eat at each meal.  Eat 4-6 ounces (oz) of lean protein each day, such as lean meat, chicken, fish, eggs, or tofu. One oz of lean protein is equal to: ? 1 oz of meat, chicken, or fish. ? 1 egg. ?  cup of tofu.  Eat some foods each day that contain healthy fats, such as avocado, nuts,  seeds, and fish. Lifestyle  Check your blood glucose regularly.  Exercise regularly as told by your health care provider. This may include: ? 150 minutes of moderate-intensity or vigorous-intensity exercise each week. This could be brisk walking, biking, or water aerobics. ? Stretching and doing strength exercises, such as yoga or weightlifting, at least 2 times a week.  Take medicines as told by your health care provider.  Do not use any products that contain nicotine or tobacco, such as cigarettes and e-cigarettes. If you need help quitting, ask your health care provider.  Work with a Social worker or diabetes educator to identify strategies to manage stress and any emotional and social challenges. Questions to ask a health care provider  Do I need to meet with a diabetes educator?  Do I need to meet with a dietitian?  What number can I call if I have questions?  When are the best times to check my blood glucose? Where to find more information:  American Diabetes Association: diabetes.org  Academy of Nutrition and Dietetics: www.eatright.[employer] of Diabetes and Digestive and Kidney Diseases (NIH): [employer] Summary  A healthy meal plan will help you control your blood glucose and maintain a healthy lifestyle.  Working with a diet and nutrition specialist (dietitian) can help you make a meal plan that is best for you.  Keep in mind that carbohydrates (carbs) and alcohol have immediate effects on your blood glucose levels. It is important to count carbs and to use alcohol carefully. This information is not intended to replace advice given to you by your health care provider. Make sure you discuss any questions you have with your health care provider. Document Revised: 02/18/2017 Document Reviewed: 04/12/2016 Elsevier Patient Education  2020 [employer].

## 2019-12-19 ENCOUNTER — Other Ambulatory Visit: Payer: Self-pay | Admitting: Nurse Practitioner

## 2019-12-19 ENCOUNTER — Telehealth: Payer: Self-pay

## 2019-12-19 DIAGNOSIS — U071 COVID-19: Secondary | ICD-10-CM | POA: Diagnosis not present

## 2019-12-19 DIAGNOSIS — J069 Acute upper respiratory infection, unspecified: Secondary | ICD-10-CM

## 2019-12-19 DIAGNOSIS — Z1159 Encounter for screening for other viral diseases: Secondary | ICD-10-CM | POA: Diagnosis not present

## 2019-12-19 MED ORDER — AZITHROMYCIN 250 MG PO TABS: ORAL_TABLET | ORAL | 0 refills | Status: AC

## 2019-12-19 NOTE — Telephone Encounter (Signed)
I sent an antibiotic to see if this helps and I will have a practitioner to come to your house and check on you

## 2019-12-19 NOTE — Telephone Encounter (Signed)
I called the pt to let her know that Laurance Flatten, FNP-BC sent an antibiotic to the pharmacy to help the pt's cough and that she is sending out a provider to the pt's house to evaluate her.

## 2019-12-19 NOTE — Telephone Encounter (Signed)
The pt was told that Laurance Flatten, FNP-BC said that she is feeling ok, that she needed something to help with the drainage because she's trying to cough it up and that's what is messing with her breathing.

## 2019-12-22 ENCOUNTER — Telehealth: Payer: Self-pay | Admitting: Adult Health

## 2019-12-22 NOTE — Telephone Encounter (Signed)
Called to Discuss with patient about Covid symptoms and the use of the monoclonal antibody infusion for those with mild to moderate Covid symptoms and at a high risk of hospitalization.   Unable to reach, left message to call infusion hotline back for more information   Leslie Cisar NP-C

## 2019-12-23 ENCOUNTER — Telehealth: Payer: Self-pay | Admitting: Adult Health

## 2019-12-23 NOTE — Telephone Encounter (Signed)
Called to Discuss with patient about Covid symptoms and the use of the monoclonal antibody infusion for those with mild to moderate Covid symptoms and at a high risk of hospitalization.     Pt is qualified for this infusion due to co-morbid conditions and/or a member of an at-risk group.     Patient Active Problem List   Diagnosis Date Noted  . AKI (acute kidney injury) ([place]) 09/07/2019  . Acute kidney injury ([place]) 09/06/2019  . Bradycardia 09/06/2019  . Hyperglycemia 09/06/2019  . HLD (hyperlipidemia)   . Hypertensive nephropathy 05/16/2019  . Erosive gastritis 11/16/2017  . Fatty liver 11/16/2017  . Abdominal pain 12/11/2012  . Early satiety 12/11/2012  . Hiatal hernia   . Hx of cholecystectomy 02/05/2011  . Flu-like symptoms 02/05/2011  . CONSTIPATION 08/27/2008  . FATTY LIVER DISEASE 05/07/2008  . Nausea with vomiting 04/23/2008  . EPIGASTRIC PAIN 04/23/2008  . RENAL CALCULUS 04/22/2008  . Type 2 diabetes mellitus with stage 2 chronic kidney disease, with long-term current use of insulin ([place]) 05/19/2006  . Type 2 diabetes mellitus with hyperlipidemia ([place]) 05/19/2006  . HYPERTENSION, BENIGN SYSTEMIC 05/19/2006  . GASTROESOPHAGEAL REFLUX, NO ESOPHAGITIS 05/19/2006  . AMENORRHEA 05/19/2006    Patient declines infusion at this time. Symptoms tier reviewed as well as criteria for ending isolation. Preventative practices reviewed. Patient verbalized understanding.    Patient advised to call back if he/she decides that he/she does want to get infusion. Callback number to the infusion center given. Patient advised to go to Urgent care or ED with severe symptoms.   Rexene Edison NP -C

## 2019-12-25 ENCOUNTER — Other Ambulatory Visit: Payer: Self-pay

## 2019-12-25 MED ORDER — OZEMPIC (0.25 OR 0.5 MG/DOSE) 2 MG/1.5ML ~~LOC~~ SOPN: 0.2500 mg | PEN_INJECTOR | SUBCUTANEOUS | 2 refills | Status: DC

## 2020-01-01 ENCOUNTER — Telehealth: Payer: Medicare Other

## 2020-01-06 NOTE — Chronic Care Management (AMB) (Signed)
Chronic Care Management Pharmacy Assistant   Name: Leslie Davis  MRN: [id number] DOB: [birth year]  Reason for Encounter: Medication Review/ Monthly Dispensing Call  Patient Questions:  1.  Have you seen any other providers since your last visit? Yes, 9/22/21[name], FNP (PCP).  2.  Any changes in your medicines or health? Yes, Benzonatate 100 mg added on 12/12/19.  PCP : [name], MD  Allergies:   Allergies  Allergen Reactions  . Fiasp [Insulin Aspart (W-Niacinamide)] Hives    Medications: Outpatient Encounter Medications as of 01/07/2020  Medication Sig  . amLODipine (NORVASC) 10 MG tablet Take 1 tablet (10 mg total) by mouth daily.  Marland Kitchen aspirin EC 81 MG tablet Take 1 tablet (81 mg total) by mouth daily.  . Azilsartan Medoxomil (EDARBI) 80 MG TABS TAKE 1 TABLET EACH DAY.  . benzonatate (TESSALON PERLES) 100 MG capsule Take 1 capsule (100 mg total) by mouth every 6 (six) hours as needed for cough.  . Blood Glucose Monitoring Suppl (ONETOUCH VERIO) w/Device KIT Use as directed to check blood sugars 2 times per day dx: e11.65  . cetirizine (ZYRTEC) 10 MG tablet Take 1 tablet (10 mg total) by mouth daily.  . Cholecalciferol (VITAMIN D3) 1.25 MG (50000 UT) CAPS Take 1 capsule by mouth on Tuesday and Friday  . Continuous Blood Gluc Receiver (FREESTYLE LIBRE 14 DAY READER) DEVI by Does not apply route.  . Continuous Blood Gluc Sensor (FREESTYLE LIBRE 14 DAY SENSOR) MISC Use as directed to check blood sugars 4 times per day dx: e11.65  . ferrous gluconate (FERGON) 324 MG tablet TAKE 1 TABLET BY MOUTH TWICE DAILY WITH A MEAL.  Marland Kitchen glucose blood (ONETOUCH VERIO) test strip Use as instructed to check blood sugars 2 times per day dx:e11.65  . insulin glargine, 1 Unit Dial, (TOUJEO SOLOSTAR) 300 UNIT/ML Solostar Pen Inject 50 units subcutaneous at bedtime  . insulin lispro (HUMALOG KWIKPEN) 100 UNIT/ML KwikPen Sliding scale  . Insulin Pen Needle 32G X 6 MM MISC Use with pen  injectors daily dx code: e11.9  . OneTouch Delica Lancets 23J MISC Use as directed to check blood sugars 2 times per day dx:e11.65  . pantoprazole (PROTONIX) 40 MG tablet Take one tablet by mouth 3 days a week  . rosuvastatin (CRESTOR) 20 MG tablet Take 1 tablet (20 mg total) by mouth daily.  . Semaglutide,0.25 or 0.5MG /DOS, (OZEMPIC, 0.25 OR 0.5 MG/DOSE,) 2 MG/1.5ML SOPN Inject 0.25 mg into the skin once a week.  . temazepam (RESTORIL) 30 MG capsule One capsules po qhs prn   No facility-administered encounter medications on file as of 01/07/2020.    Current Diagnosis: Patient Active Problem List   Diagnosis Date Noted  . AKI (acute kidney injury) ([place]) 09/07/2019  . Acute kidney injury ([place]) 09/06/2019  . Bradycardia 09/06/2019  . Hyperglycemia 09/06/2019  . HLD (hyperlipidemia)   . Hypertensive nephropathy 05/16/2019  . Erosive gastritis 11/16/2017  . Fatty liver 11/16/2017  . Abdominal pain 12/11/2012  . Early satiety 12/11/2012  . Hiatal hernia   . Hx of cholecystectomy 02/05/2011  . Flu-like symptoms 02/05/2011  . CONSTIPATION 08/27/2008  . FATTY LIVER DISEASE 05/07/2008  . Nausea with vomiting 04/23/2008  . EPIGASTRIC PAIN 04/23/2008  . RENAL CALCULUS 04/22/2008  . Type 2 diabetes mellitus with stage 2 chronic kidney disease, with long-term current use of insulin ([place]) 05/19/2006  . Type 2 diabetes mellitus with hyperlipidemia ([place]) 05/19/2006  . HYPERTENSION, BENIGN SYSTEMIC 05/19/2006  . GASTROESOPHAGEAL  REFLUX, NO ESOPHAGITIS 05/19/2006  . AMENORRHEA 05/19/2006   Reviewed chart for medication changes ahead of medication coordination call.  OVs-12/12/19- [name], FNP (PCP) since last care coordination call/Pharmacist visit.  Medication changes indicated:  Benzonatate 100 mg added on 12/12/19. Azithromycin 250 mg added on 12/19/19.   BP Readings from Last 3 Encounters:  11/15/19 128/78  11/15/19 128/78  10/10/19 120/60    Lab Results  Component Value Date    HGBA1C 10.5 (H) 11/15/2019     Patient obtains medications through Vials  90 Days   Last adherence delivery included:  Humalog Kwikpen 100 units/ml Inject as directed per sliding scale, Amlodipine 10 mg 1 tablet daily, Rosuvastatin 20 mg 1 tablet daily, Cetirizine 10 mg 1 tablet daily, Pantoprazole 40 mg 1 tablet three times weekly, Vitamin D 50000 units 1 capsule Tuesday and Friday, ASA 81 mg 1 tablet daily, Edarbi 80 mg 1 tablet daily.  Patient declined Onetouch Verio Test Strips- use to test blood sugars twice a day due to receiving a 90 day supply on 11/28/19, Toujeo 50 un sq nightly due to receiving a 456 day supply on 10/31/19, Clindamycin lotion 11% due to using as needed, Ferrous Gluconate 324 mg 1 tablet twice daily last month due to receiving a 90 day supply on 09/13/19, and Flonase NS 1-2 sprays in each nostril daily due to using as needed.    Patient is due for next adherence delivery on: 01/09/20.  01/07/2020 Left message to return call to review medications and coordinate delivery. 01/08/2020  Left message to return call to review medications and coordinate delivery. 01/09/2020- Called patient again, was able to speak with patient review medications and coordinated delivery.  This delivery to include: Clindamycin lotion 1%- apply to skin in the morning daily, Ferrous Gluconate 324 mg 1 tablet twice daily, Toujeo 50 units sq nightly, Flonase NS 1-2 sprays each nostril once daily.  Short fill or acute fill not needed.   Patient declined the following medications: Humalog Kwikpen 100 units/ml Inject as directed per sliding scale, Amlodipine 10 mg 1 tablet daily, Rosuvastatin 20 mg 1 tablet daily, Cetirizine 10 mg 1 tablet daily, Pantoprazole 40 mg 1 tablet three times weekly, Vitamin D 50000 units 1 capsule Tuesday and Friday, ASA 81 mg 1 tablet daily, Edarbi 80 mg 1 tablet daily due to receiving a 90 day supply on 12/13/2019.   Confirmed delivery date of 01/10/2020, advised patient that  pharmacy will contact them the morning of delivery.  Follow-Up:  Coordination of Enhanced Pharmacy Services and Pharmacist Review  Patient has refills of these medication at the pharmacy. No other problems or complaints. Leata Mouse, CPP notified.  Pattricia Boss, [place] Pharmacist Assistant [phone]

## 2020-01-07 ENCOUNTER — Telehealth: Payer: Self-pay | Admitting: Pharmacist

## 2020-01-08 ENCOUNTER — Telehealth: Payer: Self-pay

## 2020-01-18 ENCOUNTER — Ambulatory Visit (INDEPENDENT_AMBULATORY_CARE_PROVIDER_SITE_OTHER): Payer: Medicare Other | Admitting: Orthopaedic Surgery

## 2020-01-18 ENCOUNTER — Encounter: Payer: Self-pay | Admitting: Orthopaedic Surgery

## 2020-01-18 ENCOUNTER — Ambulatory Visit: Payer: Self-pay

## 2020-01-18 ENCOUNTER — Other Ambulatory Visit: Payer: Self-pay

## 2020-01-18 VITALS — Ht 64.0 in | Wt 213.0 lb

## 2020-01-18 DIAGNOSIS — M1712 Unilateral primary osteoarthritis, left knee: Secondary | ICD-10-CM | POA: Diagnosis not present

## 2020-01-18 MED ORDER — TRAMADOL HCL 50 MG PO TABS: 50.0000 mg | ORAL_TABLET | Freq: Three times a day (TID) | ORAL | 0 refills | Status: DC | PRN

## 2020-01-18 NOTE — Progress Notes (Signed)
Office Visit Note   Patient: Leslie Davis           Date of Birth: [birth year]           MRN: [id number] Visit Date: 01/18/2020              Requested by: [name], [address] [place],   [id number] PCP: [name], MD   Assessment & Plan: Visit Diagnoses:  1. Primary osteoarthritis of left knee     Plan: Impression is left knee degenerative joint disease.  The patient has not had great relief from cortisone injection so I feel it is appropriate to obtain approval for viscosupplementation injection.  She will follow up with [place] once that has been approved.  This patient is diagnosed with osteoarthritis of the knee(s).    Radiographs show evidence of joint space narrowing, osteophytes, subchondral sclerosis and/or subchondral cysts.  This patient has knee pain which interferes with functional and activities of daily living.    This patient has experienced inadequate response, adverse effects and/or intolerance with conservative treatments such as acetaminophen, NSAIDS, topical creams, physical therapy or regular exercise, knee bracing and/or weight loss.   This patient has experienced inadequate response or has a contraindication to intra articular steroid injections for at least 3 months.   This patient is not scheduled to have a total knee replacement within 6 months of starting treatment with viscosupplementation.   Follow-Up Instructions: Return for once approved for visco inj.   Orders:  Orders Placed This Encounter  Procedures  . XR KNEE 3 VIEW LEFT   Meds ordered this encounter  Medications  . traMADol (ULTRAM) 50 MG tablet    Sig: Take 1 tablet (50 mg total) by mouth 3 (three) times daily as needed.    Dispense:  30 tablet    Refill:  0      Procedures: No procedures performed   Clinical Data: No additional findings.   Subjective: Chief Complaint  Patient presents with  . Left Knee - Pain    HPI Leslie Davis is a  pleasant 64 year old female who comes in today with left knee pain.  She notes that she had a left knee arthroscopic debridement what sounds like a meniscus tear back in August of this year by Dr. Ronnie Derby.  She has had continued pain since.  She has had 2 cortisone injections without relief of symptoms.  She has not previously had a viscosupplementation injection.  The pain she is having is to the entire aspect of the left knee which is worse going up and down stairs as well as with any activity.  She has no pain at rest.  She has been taking ibuprofen with mild relief of symptoms.  No numbness, tingling or burning.  Review of Systems as detailed in HPI.  All others reviewed and are negative.   Objective: Vital Signs: Ht 5\' 4"  (1.626 m)   Wt 213 lb (96.6 kg)   BMI 36.56 kg/m   Physical Exam well-developed well-nourished female no acute distress.  Alert oriented x3.  Ortho Exam left knee exam shows range of motion from 0 to 120.  She has medial and lateral joint line tenderness to palpation.  Moderate patellofemoral crepitus.  Ligaments are stable.  She is neurovascular intact distally.  Specialty Comments:  No specialty comments available.  Imaging: XR KNEE 3 VIEW LEFT  Result Date: 01/18/2020 Moderate medial patellofemoral degenerative changes    PMFS History: Patient Active Problem List  Diagnosis Date Noted  . AKI (acute kidney injury) ([place]) 09/07/2019  . Acute kidney injury ([place]) 09/06/2019  . Bradycardia 09/06/2019  . Hyperglycemia 09/06/2019  . HLD (hyperlipidemia)   . Hypertensive nephropathy 05/16/2019  . Erosive gastritis 11/16/2017  . Fatty liver 11/16/2017  . Abdominal pain 12/11/2012  . Early satiety 12/11/2012  . Hiatal hernia   . Hx of cholecystectomy 02/05/2011  . Flu-like symptoms 02/05/2011  . CONSTIPATION 08/27/2008  . FATTY LIVER DISEASE 05/07/2008  . Nausea with vomiting 04/23/2008  . EPIGASTRIC PAIN 04/23/2008  . RENAL CALCULUS 04/22/2008  . Type 2  diabetes mellitus with stage 2 chronic kidney disease, with long-term current use of insulin ([place]) 05/19/2006  . Type 2 diabetes mellitus with hyperlipidemia ([place]) 05/19/2006  . HYPERTENSION, BENIGN SYSTEMIC 05/19/2006  . GASTROESOPHAGEAL REFLUX, NO ESOPHAGITIS 05/19/2006  . AMENORRHEA 05/19/2006   Past Medical History:  Diagnosis Date  . DM (diabetes mellitus) ([place])   . Fatty liver disease, nonalcoholic   . Gastritis   . Gastroparesis   . GERD (gastroesophageal reflux disease)   . Hiatal hernia   . Hiatal hernia   . HLD (hyperlipidemia)   . HTN (hypertension)   . Renal calculus     Family History  Problem Relation Age of Onset  . Diabetes Mother   . Diabetes Maternal Grandmother   . Stomach cancer Maternal Aunt        X 2 aunts  . Hypertension Father   . Colon cancer Neg Hx   . Esophageal cancer Neg Hx   . Rectal cancer Neg Hx     Past Surgical History:  Procedure Laterality Date  . CHOLECYSTECTOMY    . COLONOSCOPY    . TUBAL LIGATION    . UPPER GASTROINTESTINAL ENDOSCOPY     Social History   Occupational History  . Occupation: housekeeping  Tobacco Use  . Smoking status: Former Smoker    Packs/day: 0.25    Years: 1.00    Pack years: 0.25    Quit date: 09/10/1974    Years since quitting: 45.3  . Smokeless tobacco: Never Used  . Tobacco comment: she no longer smokes.   Vaping Use  . Vaping Use: Never used  Substance and Sexual Activity  . Alcohol use: No  . Drug use: No  . Sexual activity: Not Currently

## 2020-01-21 ENCOUNTER — Telehealth: Payer: Self-pay

## 2020-01-21 NOTE — Telephone Encounter (Signed)
Please send for left knee gel approval.

## 2020-01-24 ENCOUNTER — Telehealth: Payer: Self-pay

## 2020-01-24 NOTE — Telephone Encounter (Signed)
Noted  

## 2020-01-24 NOTE — Telephone Encounter (Signed)
Submitted VOB, SynviscOne, left knee.

## 2020-01-29 ENCOUNTER — Telehealth: Payer: Self-pay

## 2020-01-29 ENCOUNTER — Telehealth: Payer: Medicare Other

## 2020-01-29 NOTE — Telephone Encounter (Signed)
Called and left a VM advising patient to call back to schedule an appointment with Dr. Erlinda Hong for gel injection.  Approved, [employer], left knee Buy & Bill Patient will be responsible for 20% OOP. No Co-pay No PA required

## 2020-01-30 ENCOUNTER — Telehealth: Payer: Medicare Other

## 2020-01-30 ENCOUNTER — Ambulatory Visit: Payer: Self-pay

## 2020-01-30 ENCOUNTER — Telehealth: Payer: Self-pay

## 2020-01-30 DIAGNOSIS — I10 Essential (primary) hypertension: Secondary | ICD-10-CM

## 2020-01-30 DIAGNOSIS — E1169 Type 2 diabetes mellitus with other specified complication: Secondary | ICD-10-CM

## 2020-01-30 DIAGNOSIS — E785 Hyperlipidemia, unspecified: Secondary | ICD-10-CM

## 2020-01-30 DIAGNOSIS — N182 Chronic kidney disease, stage 2 (mild): Secondary | ICD-10-CM

## 2020-01-30 DIAGNOSIS — E559 Vitamin D deficiency, unspecified: Secondary | ICD-10-CM

## 2020-01-30 NOTE — Telephone Encounter (Cosign Needed)
  Chronic Care Management   Outreach Note  01/30/2020 Name: Leslie Davis MRN: [id number] DOB: 02/23/1956  Referred by: [name], MD Reason for referral : Chronic Care Management (CCM RNCM FU Call )   An unsuccessful telephone outreach was attempted today. The patient was referred to the case management team for assistance with care management and care coordination.   Follow Up Plan: A HIPAA compliant phone message was left for the patient providing contact information and requesting a return call.  Telephone follow up appointment with care management team member scheduled for: 02/06/20  Barb Merino, RN, BSN, CCM Care Management Coordinator [place] [facility]  Direct Phone: [phone]

## 2020-01-31 NOTE — Patient Instructions (Signed)
Visit Information  Goals Addressed      Patient Stated   .  "I would like to better manage my Diabetes" (pt-stated)   Not on track     [place] (see longitudinal plan of care for additional care plan information)  Current Barriers:  Marland Kitchen Knowledge Deficits related to diabetes disease process and Self Health Management  . Chronic Disease Management support and education needs related to Type 2 diabetes mellitus, Hypertensive nephropathy, Chronic renal failure  Nurse Case Manager Clinical Goal(s):  . 01/30/20 New Over the next 90 days, patient will work with the CCM team to address needs related to education and support to improve Self management of DM  CCM RN CM Interventions:  01/30/20 call completed with patient  . Inter-disciplinary care team collaboration (see longitudinal plan of care) . Evaluation of current treatment plan related to Diabetes and patient's adherence to plan as established by provider. . Provided education to patient re: target A1c <7.0; Determined patient's A1c is elevated to 10.5 from 9.6; Determined patient has ongoing knee pain and has received multiple steroid injections over the past several months that may have contributed to elevating her A1c; Re-educated on daily glycemic target ranges FBS 80-130, after meals <180; Re-educated on 15'15' rule; Re-educated on dietary and exercise recommendations   . Reviewed medications with patient and discussed patient reports she is complaint with her medication regimen for DM; Current regimen:   o insulin glargine, 1 Unit Dial, (TOUJEO SOLOSTAR) 300 UNIT/ML Solostar Pen o Inject 50 units subcutaneous at bedtime   o insulin lispro (HUMALOG KWIKPEN) 100 UNIT/ML KwikPen o Sliding scale   o Semaglutide,0.25 or 0.5MG /DOS, (OZEMPIC, 0.25 OR 0.5 MG/DOSE,) 2 MG/1.5ML SOPN o Inject 0.25 mg into the skin once a week.   . Advised patient, providing education and rationale, to check cbg 2-3 daily before meals and record, calling the  CCM team and or PCP for findings outside established parameters  . Discussed plans with patient for ongoing care management follow up and provided patient with direct contact information for care management team  Patient Self Care Activities:  . Self administers medications as prescribed . Attends all scheduled provider appointments . Calls pharmacy for medication refills . Calls provider office for new concerns or questions  Please see past updates related to this goal by clicking on the "Past Updates" button in the selected goal      .  "to improve my Vitamin d" (pt-stated)        [place] (see longitudinal plan of care for additional care plan information)  Current Barriers:  Marland Kitchen Knowledge Deficits related to disease process and Self Health management of Vitamin D deficiency  . Chronic Disease Management support and education needs related to Type 2 diabetes mellitus, Hypertensive nephropathy, Chronic renal failure  Nurse Case Manager Clinical Goal(s):  Marland Kitchen Over the next 90 days, patient will work with the CCM team and PCP to address needs related to disease education and support improved Self Health management of Vitamin D deficiency   CCM RN CM Interventions:  01/30/20 call completed with patient  . Inter-disciplinary care team collaboration (see longitudinal plan of care) . Evaluation of current treatment plan related to Vitamin D deficiency  and patient's adherence to plan as established by provider . Determined patients Vitamin d was 24.3 at last check with target range of 60-80 . Provided education to patient re: Vitamin D deficiency cause and effect; Educated on dietary recommendations, Educated on supplement recommendations  and how frequently to monitor this condition  . Reviewed medications with patient and discussed patient is adhering to taking her prescribed medication . Cholecalciferol (VITAMIN D3) 1.25 MG (50000 UT) CAPS . Take 1 capsule by mouth on Tuesday and  Friday .  Mailed patient printed educational materials related to FAQ about Vitamin D; What is Vitamin D deficiency? . Discussed plans with patient for ongoing care management follow up and provided patient with direct contact information for care management team  Patient Self Care Activities:  . Self administers medications as prescribed . Attends all scheduled provider appointments . Calls pharmacy for medication refills . Calls provider office for new concerns or questions  Initial goal documentation        Patient verbalizes understanding of instructions provided today.   Telephone follow up appointment with care management team member scheduled for: 03/12/20  Barb Merino, RN, BSN, CCM Care Management Coordinator [place] [facility]  Direct Phone: [phone]

## 2020-01-31 NOTE — Chronic Care Management (AMB) (Signed)
Chronic Care Management   Follow Up Note   01/30/2020 Name: Leslie Davis MRN: [id number] DOB: Mar 28, 1955  Referred by: [name], MD Reason for referral : Chronic Care Management (CCM RN CM FU Call )   Leslie Davis is a 64 y.o. year old female who is a primary care patient of [name], MD. The CCM team was consulted for assistance with chronic disease management and care coordination needs.    Review of patient status, including review of consultants reports, relevant laboratory and other test results, and collaboration with appropriate care team members and the patient's provider was performed as part of comprehensive patient evaluation and provision of chronic care management services.    SDOH (Social Determinants of Health) assessments performed: Yes - no acute challenges  See Care Plan activities for detailed interventions related to Veteran)   Placed outbound CCM RN CM follow up call to patient for a care plan update.     Outpatient Encounter Medications as of 01/30/2020  Medication Sig  . amLODipine (NORVASC) 10 MG tablet Take 1 tablet (10 mg total) by mouth daily.  Marland Kitchen aspirin EC 81 MG tablet Take 1 tablet (81 mg total) by mouth daily.  . Azilsartan Medoxomil (EDARBI) 80 MG TABS TAKE 1 TABLET EACH DAY.  . benzonatate (TESSALON PERLES) 100 MG capsule Take 1 capsule (100 mg total) by mouth every 6 (six) hours as needed for cough.  . Blood Glucose Monitoring Suppl (ONETOUCH VERIO) w/Device KIT Use as directed to check blood sugars 2 times per day dx: e11.65  . cetirizine (ZYRTEC) 10 MG tablet Take 1 tablet (10 mg total) by mouth daily.  . Cholecalciferol (VITAMIN D3) 1.25 MG (50000 UT) CAPS Take 1 capsule by mouth on Tuesday and Friday  . Continuous Blood Gluc Receiver (FREESTYLE LIBRE 14 DAY READER) DEVI by Does not apply route.  . Continuous Blood Gluc Sensor (FREESTYLE LIBRE 14 DAY SENSOR) MISC Use as directed to check blood sugars 4 times per day dx: e11.65    . ferrous gluconate (FERGON) 324 MG tablet TAKE 1 TABLET BY MOUTH TWICE DAILY WITH A MEAL.  Marland Kitchen glucose blood (ONETOUCH VERIO) test strip Use as instructed to check blood sugars 2 times per day dx:e11.65  . insulin glargine, 1 Unit Dial, (TOUJEO SOLOSTAR) 300 UNIT/ML Solostar Pen Inject 50 units subcutaneous at bedtime  . insulin lispro (HUMALOG KWIKPEN) 100 UNIT/ML KwikPen Sliding scale  . Insulin Pen Needle 32G X 6 MM MISC Use with pen injectors daily dx code: e11.9  . OneTouch Delica Lancets 78G MISC Use as directed to check blood sugars 2 times per day dx:e11.65  . pantoprazole (PROTONIX) 40 MG tablet Take one tablet by mouth 3 days a week  . rosuvastatin (CRESTOR) 20 MG tablet Take 1 tablet (20 mg total) by mouth daily.  . Semaglutide,0.25 or 0.5MG/DOS, (OZEMPIC, 0.25 OR 0.5 MG/DOSE,) 2 MG/1.5ML SOPN Inject 0.25 mg into the skin once a week.  . temazepam (RESTORIL) 30 MG capsule One capsules po qhs prn  . traMADol (ULTRAM) 50 MG tablet Take 1 tablet (50 mg total) by mouth 3 (three) times daily as needed.   No facility-administered encounter medications on file as of 01/30/2020.     Objective:  Lab Results  Component Value Date   HGBA1C 10.5 (H) 11/15/2019   HGBA1C 9.6 (H) 08/13/2019   HGBA1C 8.4 (H) 05/16/2019   Lab Results  Component Value Date   MICROALBUR 150 11/15/2019   LDLCALC 109 (H) 11/15/2019  CREATININE 1.11 (H) 11/15/2019   BP Readings from Last 3 Encounters:  11/15/19 128/78  11/15/19 128/78  10/10/19 120/60    Goals Addressed      Patient Stated   .  "I would like to better manage my Diabetes" (pt-stated)   Not on track     [place] (see longitudinal plan of care for additional care plan information)  Current Barriers:  Marland Kitchen Knowledge Deficits related to diabetes disease process and Self Health Management  . Chronic Disease Management support and education needs related to Type 2 diabetes mellitus, Hypertensive nephropathy, Chronic renal  failure  Nurse Case Manager Clinical Goal(s):  . 01/30/20 New Over the next 90 days, patient will work with the CCM team to address needs related to education and support to improve Self management of DM  CCM RN CM Interventions:  01/30/20 call completed with patient  . Inter-disciplinary care team collaboration (see longitudinal plan of care) . Evaluation of current treatment plan related to Diabetes and patient's adherence to plan as established by provider. . Provided education to patient re: target A1c <7.0; Determined patient's A1c is elevated to 10.5 from 9.6; Determined patient has ongoing knee pain and has received multiple steroid injections over the past several months that may have contributed to elevating her A1c; Re-educated on daily glycemic target ranges FBS 80-130, after meals <180; Re-educated on 15'15' rule; Re-educated on dietary and exercise recommendations   . Reviewed medications with patient and discussed patient reports she is complaint with her medication regimen for DM; Current regimen:   o insulin glargine, 1 Unit Dial, (TOUJEO SOLOSTAR) 300 UNIT/ML Solostar Pen o Inject 50 units subcutaneous at bedtime   o insulin lispro (HUMALOG KWIKPEN) 100 UNIT/ML KwikPen o Sliding scale   o Semaglutide,0.25 or 0.5MG/DOS, (OZEMPIC, 0.25 OR 0.5 MG/DOSE,) 2 MG/1.5ML SOPN o Inject 0.25 mg into the skin once a week.   . Advised patient, providing education and rationale, to check cbg 2-3 daily before meals and record, calling the CCM team and or PCP for findings outside established parameters  . Discussed plans with patient for ongoing care management follow up and provided patient with direct contact information for care management team  Patient Self Care Activities:  . Self administers medications as prescribed . Attends all scheduled provider appointments . Calls pharmacy for medication refills . Calls provider office for new concerns or questions  Please see past updates  related to this goal by clicking on the "Past Updates" button in the selected goal      .  "to improve my Vitamin d" (pt-stated)        [place] (see longitudinal plan of care for additional care plan information)  Current Barriers:  Marland Kitchen Knowledge Deficits related to disease process and Self Health management of Vitamin D deficiency  . Chronic Disease Management support and education needs related to Type 2 diabetes mellitus, Hypertensive nephropathy, Chronic renal failure  Nurse Case Manager Clinical Goal(s):  Marland Kitchen Over the next 90 days, patient will work with the CCM team and PCP to address needs related to disease education and support improved Self Health management of Vitamin D deficiency   CCM RN CM Interventions:  01/30/20 call completed with patient  . Inter-disciplinary care team collaboration (see longitudinal plan of care) . Evaluation of current treatment plan related to Vitamin D deficiency  and patient's adherence to plan as established by provider . Determined patients Vitamin d was 24.3 at last check with target range  of 60-80 . Provided education to patient re: Vitamin D deficiency cause and effect; Educated on dietary recommendations, Educated on supplement recommendations and how frequently to monitor this condition  . Reviewed medications with patient and discussed patient is adhering to taking her prescribed medication . Cholecalciferol (VITAMIN D3) 1.25 MG (50000 UT) CAPS . Take 1 capsule by mouth on Tuesday and Friday .  Mailed patient printed educational materials related to FAQ about Vitamin D; What is Vitamin D deficiency? . Discussed plans with patient for ongoing care management follow up and provided patient with direct contact information for care management team  Patient Self Care Activities:  . Self administers medications as prescribed . Attends all scheduled provider appointments . Calls pharmacy for medication refills . Calls provider office for new  concerns or questions  Initial goal documentation       Plan:   Telephone follow up appointment with care management team member scheduled for: 03/12/20  Barb Merino, RN, BSN, CCM Care Management Coordinator [place] [facility]  Direct Phone: [phone]

## 2020-02-01 ENCOUNTER — Telehealth: Payer: Self-pay

## 2020-02-01 NOTE — Chronic Care Management (AMB) (Signed)
Chronic Care Management Pharmacy Assistant   Name: Leslie Davis  MRN: [id number] DOB: [birth year]  Reason for Encounter: Medication Review  Patient Questions:  1.  Have you seen any other providers since your last visit? Yes, 01/18/2020- Dr. Erlinda Hong (Orthopedics). 01/30/2020- Barb Merino, RN (CCM RN)  2.  Any changes in your medicines or health? Yes, 01/18/2020- Tramadol 50 mg added.   PCP : [name], MD  Allergies:   Allergies  Allergen Reactions  . Fiasp [Insulin Aspart (W-Niacinamide)] Hives    Medications: Outpatient Encounter Medications as of 02/01/2020  Medication Sig  . amLODipine (NORVASC) 10 MG tablet Take 1 tablet (10 mg total) by mouth daily.  Marland Kitchen aspirin EC 81 MG tablet Take 1 tablet (81 mg total) by mouth daily.  . Azilsartan Medoxomil (EDARBI) 80 MG TABS TAKE 1 TABLET EACH DAY.  . benzonatate (TESSALON PERLES) 100 MG capsule Take 1 capsule (100 mg total) by mouth every 6 (six) hours as needed for cough.  . Blood Glucose Monitoring Suppl (ONETOUCH VERIO) w/Device KIT Use as directed to check blood sugars 2 times per day dx: e11.65  . cetirizine (ZYRTEC) 10 MG tablet Take 1 tablet (10 mg total) by mouth daily.  . Cholecalciferol (VITAMIN D3) 1.25 MG (50000 UT) CAPS Take 1 capsule by mouth on Tuesday and Friday  . Continuous Blood Gluc Receiver (FREESTYLE LIBRE 14 DAY READER) DEVI by Does not apply route.  . Continuous Blood Gluc Sensor (FREESTYLE LIBRE 14 DAY SENSOR) MISC Use as directed to check blood sugars 4 times per day dx: e11.65  . ferrous gluconate (FERGON) 324 MG tablet TAKE 1 TABLET BY MOUTH TWICE DAILY WITH A MEAL.  Marland Kitchen glucose blood (ONETOUCH VERIO) test strip Use as instructed to check blood sugars 2 times per day dx:e11.65  . insulin glargine, 1 Unit Dial, (TOUJEO SOLOSTAR) 300 UNIT/ML Solostar Pen Inject 50 units subcutaneous at bedtime  . insulin lispro (HUMALOG KWIKPEN) 100 UNIT/ML KwikPen Sliding scale  . Insulin Pen Needle 32G X 6 MM MISC Use  with pen injectors daily dx code: e11.9  . OneTouch Delica Lancets 32Z MISC Use as directed to check blood sugars 2 times per day dx:e11.65  . pantoprazole (PROTONIX) 40 MG tablet Take one tablet by mouth 3 days a week  . rosuvastatin (CRESTOR) 20 MG tablet Take 1 tablet (20 mg total) by mouth daily.  . Semaglutide,0.25 or 0.5MG/DOS, (OZEMPIC, 0.25 OR 0.5 MG/DOSE,) 2 MG/1.5ML SOPN Inject 0.25 mg into the skin once a week.  . temazepam (RESTORIL) 30 MG capsule One capsules po qhs prn  . traMADol (ULTRAM) 50 MG tablet Take 1 tablet (50 mg total) by mouth 3 (three) times daily as needed.   No facility-administered encounter medications on file as of 02/01/2020.    Current Diagnosis: Patient Active Problem List   Diagnosis Date Noted  . AKI (acute kidney injury) ([name]) 09/07/2019  . Acute kidney injury ([place]) 09/06/2019  . Bradycardia 09/06/2019  . Hyperglycemia 09/06/2019  . HLD (hyperlipidemia)   . Hypertensive nephropathy 05/16/2019  . Erosive gastritis 11/16/2017  . Fatty liver 11/16/2017  . Abdominal pain 12/11/2012  . Early satiety 12/11/2012  . Hiatal hernia   . Hx of cholecystectomy 02/05/2011  . Flu-like symptoms 02/05/2011  . CONSTIPATION 08/27/2008  . FATTY LIVER DISEASE 05/07/2008  . Nausea with vomiting 04/23/2008  . EPIGASTRIC PAIN 04/23/2008  . RENAL CALCULUS 04/22/2008  . Type 2 diabetes mellitus with stage 2 chronic kidney disease, with long-term current use  of insulin ([facility]) 05/19/2006  . Type 2 diabetes mellitus with hyperlipidemia ([facility]) 05/19/2006  . HYPERTENSION, BENIGN SYSTEMIC 05/19/2006  . GASTROESOPHAGEAL REFLUX, NO ESOPHAGITIS 05/19/2006  . AMENORRHEA 05/19/2006    Reviewed chart for medication changes ahead of medication coordination call.  OVs, Consults - 01/18/2020- Dr. Xu (Orthopedics). 01/30/2020- Angel Little, RN (CCM RN) since last care coordination call/Pharmacist visit.   Medication changes indicated: 01/18/2020- Tramadol 50 mg added.  BP  Readings from Last 3 Encounters:  11/15/19 128/78  11/15/19 128/78  10/10/19 120/60    Lab Results  Component Value Date   HGBA1C 10.5 (H) 11/15/2019     Patient obtains medications through Vials  90 Days   Last adherence delivery included:   Clindamycin lotion 1%- apply to skin in the morning daily Ferrous Gluconate 324 mg 1 tablet twice daily Toujeo 50 units sq nightly Flonase NS 1-2 sprays each nostril once daily.  Patient declined the following medications last month:   Humalog Kwikpen 100 units/ml Inject as directed per sliding scale Amlodipine 10 mg1 tablet daily Rosuvastatin20 mg 1 tablet daily Cetirizine 10 mg1 tablet daily Pantoprazole 40 mg 1 tabletthree times weekly Vitamin D 50000 units 1 capsule Tuesday and Friday ASA 81 mg 1 tablet daily Edarbi 80 mg1 tablet daily  due to receiving a 90 day supply on 12/13/2019.   Patient is due for next adherence delivery on: 02/08/2020. 02/01/2020- Called patient, left message to return call. 02/07/2020-Called patient and reviewed medications and coordinated delivery.  This delivery to include: Pantoprazole 40 mg -1 tablet every other day Vitamin D 50000 units -1 capsule Tuesday and Friday Tramadol 50 mg- 1 tablet by mouth three times daily as needed Ozempic 2/1.5 ml- Inject 0.25 mg sq once a week  Short fill or acute fill not needed.  Patient declined the following medications: Clindamycin lotion 1%- apply to skin in the morning daily due to PRN use received a supply on 01/09/2020. Ferrous Gluconate 324 mg- 1 tablet twice daily due to receiving a 90 day supply on 01/09/2020. Toujeo -50 units sq nightly due to receiving an 81 day supply on 01/09/2020. Flonase NS 1-2 sprays each nostril once daily. Due to PRN use received a 90 day supply on 10/20/201.  Humalog Kwikpen 100 units/ml- Inject as directed per sliding scale Amlodipine 10 mg-1 tablet daily Rosuvastatin20 mg- 1 tablet daily Cetirizine 10 mg-1 tablet  daily ASA 81 mg- 1 tablet daily Edarbi 80 mg-1 tablet daily  Hydrochlorothiazide 12.5 mg- 1 tablet daily   due to receiving a 90 day supply on 12/13/2019.  Crestor 20 mg- 1 tablet daily due to receiving a 90 day supply on 12/12/2019.    Patient needs refills for Temazepam 30 mg and Tramadol 50 mg.  Confirmed delivery date of 02/08/2020, advised patient that pharmacy will contact them the morning of delivery.  Follow-Up:  Coordination of Enhanced Pharmacy Services and Pharmacist Review  Patient mentioned that she takes her Pantoprazole 40 mg every other day Monday, Wednesday, Friday and Sunday. Request send for refill of Temazepam and Tramadol, Vallie Pearson, CPP notified.  Tamala Melvin, CMA Clinical Pharmacist Assistant [phone]  

## 2020-02-05 ENCOUNTER — Ambulatory Visit (INDEPENDENT_AMBULATORY_CARE_PROVIDER_SITE_OTHER): Payer: Medicare Other | Admitting: Orthopaedic Surgery

## 2020-02-05 ENCOUNTER — Ambulatory Visit: Payer: Self-pay

## 2020-02-05 ENCOUNTER — Encounter: Payer: Self-pay | Admitting: Orthopaedic Surgery

## 2020-02-05 ENCOUNTER — Telehealth: Payer: Self-pay

## 2020-02-05 DIAGNOSIS — M1712 Unilateral primary osteoarthritis, left knee: Secondary | ICD-10-CM

## 2020-02-05 DIAGNOSIS — M1711 Unilateral primary osteoarthritis, right knee: Secondary | ICD-10-CM | POA: Diagnosis not present

## 2020-02-05 NOTE — Telephone Encounter (Signed)
Please get auth for right knee gel injection-Dr. Erlinda Hong

## 2020-02-05 NOTE — Progress Notes (Signed)
Office Visit Note   Patient: Leslie Davis           Date of Birth: [birth year]           MRN: [id number] Visit Date: 02/05/2020              Requested by: [name], [address] [place],  [place] [address] PCP: [name], MD   Assessment & Plan: Visit Diagnoses:  1. Unilateral primary osteoarthritis, right knee   2. Primary osteoarthritis of left knee     Plan: Impression is right knee osteoarthritis.  Due to the patient's underlying diabetes and previous response to cortisone injection with significantly increasing her blood sugar, we elected to not proceed with this on the right knee.  She would like to proceed with viscosupplementation injection.  We will get approval for this.  This patient is diagnosed with osteoarthritis of the knee(s).    Radiographs show evidence of joint space narrowing, osteophytes, subchondral sclerosis and/or subchondral cysts.  This patient has knee pain which interferes with functional and activities of daily living.    This patient has experienced inadequate response, adverse effects and/or intolerance with conservative treatments such as acetaminophen, NSAIDS, topical creams, physical therapy or regular exercise, knee bracing and/or weight loss.   This patient has experienced inadequate response or has a contraindication to intra articular steroid injections for at least 3 months.   This patient is not scheduled to have a total knee replacement within 6 months of starting treatment with viscosupplementation.   Follow-Up Instructions: Return for after approval for right knee visco approval.   Orders:  Orders Placed This Encounter  Procedures  . Large Joint Inj: R knee  . XR Knee Complete 4 Views Right   No orders of the defined types were placed in this encounter.     Procedures: No procedures performed   Clinical Data: No additional findings.   Subjective: Chief Complaint  Patient presents with  .  Left Knee - Pain  . Right Knee - Pain    HPI patient is a pleasant 64 year old female who comes in today with right knee pain which recently worsened.  The pain she has is to the entire knee but worse to the lateral aspect.  She has increased pain bearing weight.  She denies any mechanical symptoms.  Review of Systems as detailed in HPI.  All others reviewed and are negative.   Objective: Vital Signs: There were no vitals taken for this visit.  Physical Exam well-developed well-nourished female no acute distress.  Alert and oriented x3.  Ortho Exam right knee exam shows a trace effusion.  Range of motion 0 to 95 degrees.  Medial and lateral joint line tenderness.  Ligaments are stable.  She is neurovascular intact distally.  Specialty Comments:  No specialty comments available.  Imaging: XR Knee Complete 4 Views Right  Result Date: 02/05/2020 Right knee shows mild to moderate degenerative changes to the  Medial and patellofemoral compartments    PMFS History: Patient Active Problem List   Diagnosis Date Noted  . AKI (acute kidney injury) ([place]) 09/07/2019  . Acute kidney injury ([place]) 09/06/2019  . Bradycardia 09/06/2019  . Hyperglycemia 09/06/2019  . HLD (hyperlipidemia)   . Hypertensive nephropathy 05/16/2019  . Erosive gastritis 11/16/2017  . Fatty liver 11/16/2017  . Abdominal pain 12/11/2012  . Early satiety 12/11/2012  . Hiatal hernia   . Hx of cholecystectomy 02/05/2011  . Flu-like symptoms 02/05/2011  . CONSTIPATION  08/27/2008  . FATTY LIVER DISEASE 05/07/2008  . Nausea with vomiting 04/23/2008  . EPIGASTRIC PAIN 04/23/2008  . RENAL CALCULUS 04/22/2008  . Type 2 diabetes mellitus with stage 2 chronic kidney disease, with long-term current use of insulin ([place]) 05/19/2006  . Type 2 diabetes mellitus with hyperlipidemia ([place]) 05/19/2006  . HYPERTENSION, BENIGN SYSTEMIC 05/19/2006  . GASTROESOPHAGEAL REFLUX, NO ESOPHAGITIS 05/19/2006  . AMENORRHEA 05/19/2006    Past Medical History:  Diagnosis Date  . DM (diabetes mellitus) ([place])   . Fatty liver disease, nonalcoholic   . Gastritis   . Gastroparesis   . GERD (gastroesophageal reflux disease)   . Hiatal hernia   . Hiatal hernia   . HLD (hyperlipidemia)   . HTN (hypertension)   . Renal calculus     Family History  Problem Relation Age of Onset  . Diabetes Mother   . Diabetes Maternal Grandmother   . Stomach cancer Maternal Aunt        X 2 aunts  . Hypertension Father   . Colon cancer Neg Hx   . Esophageal cancer Neg Hx   . Rectal cancer Neg Hx     Past Surgical History:  Procedure Laterality Date  . CHOLECYSTECTOMY    . COLONOSCOPY    . TUBAL LIGATION    . UPPER GASTROINTESTINAL ENDOSCOPY     Social History   Occupational History  . Occupation: housekeeping  Tobacco Use  . Smoking status: Former Smoker    Packs/day: 0.25    Years: 1.00    Pack years: 0.25    Quit date: 09/10/1974    Years since quitting: 45.4  . Smokeless tobacco: Never Used  . Tobacco comment: she no longer smokes.   Vaping Use  . Vaping Use: Never used  Substance and Sexual Activity  . Alcohol use: No  . Drug use: No  . Sexual activity: Not Currently

## 2020-02-05 NOTE — Progress Notes (Signed)
   Procedure Note  Patient: Leslie Davis             Date of Birth: 1956-03-13           MRN: [id number]             Visit Date: 02/05/2020  Procedures: Visit Diagnoses:  1. Primary osteoarthritis of left knee     Large Joint Inj: R knee on 02/05/2020 2:26 PM Indications: pain Details: 22 G needle, anterolateral approach Medications: 2 mL lidocaine 1 %; 2 mL bupivacaine 0.25 %; 48 mg Hylan 48 MG/6ML

## 2020-02-06 ENCOUNTER — Telehealth: Payer: Medicare Other

## 2020-02-06 MED ORDER — HYLAN G-F 20 48 MG/6ML IX SOSY
48.0000 mg | PREFILLED_SYRINGE | INTRA_ARTICULAR | Status: AC | PRN
  Administered 2020-02-05: 48 mg via INTRA_ARTICULAR

## 2020-02-06 MED ORDER — BUPIVACAINE HCL 0.25 % IJ SOLN
2.0000 mL | INTRAMUSCULAR | Status: AC | PRN
  Administered 2020-02-05: 2 mL via INTRA_ARTICULAR

## 2020-02-06 MED ORDER — LIDOCAINE HCL 1 % IJ SOLN
2.0000 mL | INTRAMUSCULAR | Status: AC | PRN
  Administered 2020-02-05: 2 mL

## 2020-02-07 ENCOUNTER — Other Ambulatory Visit: Payer: Self-pay | Admitting: Internal Medicine

## 2020-02-07 DIAGNOSIS — G4700 Insomnia, unspecified: Secondary | ICD-10-CM

## 2020-02-07 MED ORDER — TEMAZEPAM 30 MG PO CAPS: ORAL_CAPSULE | ORAL | 0 refills | Status: DC

## 2020-02-07 MED ORDER — TRAMADOL HCL 50 MG PO TABS: 50.0000 mg | ORAL_TABLET | Freq: Two times a day (BID) | ORAL | 0 refills | Status: DC | PRN

## 2020-02-08 ENCOUNTER — Telehealth: Payer: Self-pay | Admitting: Orthopaedic Surgery

## 2020-02-08 NOTE — Telephone Encounter (Signed)
Noted  

## 2020-02-08 NOTE — Telephone Encounter (Signed)
Patient called advised she is still having a lot of pain in her left knee. Patient said the injection did not work. The number to contact patient is [phone]

## 2020-02-08 NOTE — Telephone Encounter (Signed)
See message.

## 2020-02-11 NOTE — Telephone Encounter (Signed)
Can take up to 6 weeks.  Ok to call in tramadol 50mg  1 tab every 6  hours #30 no refills

## 2020-02-12 ENCOUNTER — Telehealth: Payer: Self-pay | Admitting: Orthopaedic Surgery

## 2020-02-12 MED ORDER — TRAMADOL HCL 50 MG PO TABS
50.0000 mg | ORAL_TABLET | Freq: Four times a day (QID) | ORAL | 0 refills | Status: DC | PRN
Start: 2020-02-12 — End: 2020-02-21

## 2020-02-12 NOTE — Telephone Encounter (Signed)
IC spoke with patient. She would like rx called to pharmacy. I called to her pharmacy.

## 2020-02-12 NOTE — Telephone Encounter (Signed)
DEA and clarification given

## 2020-02-12 NOTE — Telephone Encounter (Signed)
Received call from [place] with [facility] needing Dr. Phoebe Sharps DEA number and also need clarification on Rx instructions. The number to contact [place] is [phone]

## 2020-02-18 ENCOUNTER — Other Ambulatory Visit: Payer: Self-pay

## 2020-02-18 MED ORDER — ONETOUCH VERIO VI STRP
ORAL_STRIP | 3 refills | Status: DC
Start: 2020-02-18 — End: 2021-12-28

## 2020-02-19 ENCOUNTER — Telehealth: Payer: Self-pay

## 2020-02-19 NOTE — Chronic Care Management (AMB) (Signed)
Chronic Care Management Pharmacy Assistant   Name: Leslie Davis  MRN: [id number] DOB: May 30, 1955  Reason for Encounter: Medication Review    PCP : [name], MD  Allergies:   Allergies  Allergen Reactions  . Fiasp [Insulin Aspart (W-Niacinamide)] Hives    Medications: Outpatient Encounter Medications as of 02/19/2020  Medication Sig  . amLODipine (NORVASC) 10 MG tablet Take 1 tablet (10 mg total) by mouth daily.  Marland Kitchen aspirin EC 81 MG tablet Take 1 tablet (81 mg total) by mouth daily.  . Azilsartan Medoxomil (EDARBI) 80 MG TABS TAKE 1 TABLET EACH DAY.  . benzonatate (TESSALON PERLES) 100 MG capsule Take 1 capsule (100 mg total) by mouth every 6 (six) hours as needed for cough.  . Blood Glucose Monitoring Suppl (ONETOUCH VERIO) w/Device KIT Use as directed to check blood sugars 2 times per day dx: e11.65  . cetirizine (ZYRTEC) 10 MG tablet Take 1 tablet (10 mg total) by mouth daily.  . Cholecalciferol (VITAMIN D3) 1.25 MG (50000 UT) CAPS Take 1 capsule by mouth on Tuesday and Friday  . Continuous Blood Gluc Receiver (FREESTYLE LIBRE 14 DAY READER) DEVI by Does not apply route.  . Continuous Blood Gluc Sensor (FREESTYLE LIBRE 14 DAY SENSOR) MISC Use as directed to check blood sugars 4 times per day dx: e11.65  . ferrous gluconate (FERGON) 324 MG tablet TAKE 1 TABLET BY MOUTH TWICE DAILY WITH A MEAL.  Marland Kitchen glucose blood (ONETOUCH VERIO) test strip Use as instructed to check blood sugars 2 times per day dx:e11.65  . insulin glargine, 1 Unit Dial, (TOUJEO SOLOSTAR) 300 UNIT/ML Solostar Pen Inject 50 units subcutaneous at bedtime  . insulin lispro (HUMALOG KWIKPEN) 100 UNIT/ML KwikPen Sliding scale  . Insulin Pen Needle 32G X 6 MM MISC Use with pen injectors daily dx code: e11.9  . OneTouch Delica Lancets 28B MISC Use as directed to check blood sugars 2 times per day dx:e11.65  . pantoprazole (PROTONIX) 40 MG tablet Take one tablet by mouth 3 days a week  . rosuvastatin  (CRESTOR) 20 MG tablet Take 1 tablet (20 mg total) by mouth daily.  . Semaglutide,0.25 or 0.5MG /DOS, (OZEMPIC, 0.25 OR 0.5 MG/DOSE,) 2 MG/1.5ML SOPN Inject 0.25 mg into the skin once a week.  . temazepam (RESTORIL) 30 MG capsule One capsules po qhs prn  . traMADol (ULTRAM) 50 MG tablet Take 1 tablet (50 mg total) by mouth every 12 (twelve) hours as needed.  . traMADol (ULTRAM) 50 MG tablet Take 1 tablet (50 mg total) by mouth every 6 (six) hours as needed.   No facility-administered encounter medications on file as of 02/19/2020.    Current Diagnosis: Patient Active Problem List   Diagnosis Date Noted  . AKI (acute kidney injury) ([place]) 09/07/2019  . Acute kidney injury ([place]) 09/06/2019  . Bradycardia 09/06/2019  . Hyperglycemia 09/06/2019  . HLD (hyperlipidemia)   . Hypertensive nephropathy 05/16/2019  . Erosive gastritis 11/16/2017  . Fatty liver 11/16/2017  . Abdominal pain 12/11/2012  . Early satiety 12/11/2012  . Hiatal hernia   . Hx of cholecystectomy 02/05/2011  . Flu-like symptoms 02/05/2011  . CONSTIPATION 08/27/2008  . FATTY LIVER DISEASE 05/07/2008  . Nausea with vomiting 04/23/2008  . EPIGASTRIC PAIN 04/23/2008  . RENAL CALCULUS 04/22/2008  . Type 2 diabetes mellitus with stage 2 chronic kidney disease, with long-term current use of insulin ([place]) 05/19/2006  . Type 2 diabetes mellitus with hyperlipidemia ([place]) 05/19/2006  . HYPERTENSION, BENIGN SYSTEMIC 05/19/2006  .  GASTROESOPHAGEAL REFLUX, NO ESOPHAGITIS 05/19/2006  . AMENORRHEA 05/19/2006      Follow-Up:  Pharmacist Review- Reviewed chart and adherence measures. Per Insurance data medication adherence for cholesterol 90-99 % med compliance. Medication adherence for hypertension 80-89% med compliance.  [place], [place] Pharmacist Assistant [phone]

## 2020-02-21 ENCOUNTER — Ambulatory Visit (INDEPENDENT_AMBULATORY_CARE_PROVIDER_SITE_OTHER): Payer: Medicare Other | Admitting: Internal Medicine

## 2020-02-21 ENCOUNTER — Encounter: Payer: Self-pay | Admitting: Internal Medicine

## 2020-02-21 ENCOUNTER — Other Ambulatory Visit: Payer: Self-pay

## 2020-02-21 VITALS — BP 152/68 | HR 74 | Temp 98.4°F | Ht 64.0 in | Wt 216.0 lb

## 2020-02-21 DIAGNOSIS — R5383 Other fatigue: Secondary | ICD-10-CM

## 2020-02-21 DIAGNOSIS — Z794 Long term (current) use of insulin: Secondary | ICD-10-CM

## 2020-02-21 DIAGNOSIS — E1122 Type 2 diabetes mellitus with diabetic chronic kidney disease: Secondary | ICD-10-CM | POA: Diagnosis not present

## 2020-02-21 DIAGNOSIS — N182 Chronic kidney disease, stage 2 (mild): Secondary | ICD-10-CM | POA: Diagnosis not present

## 2020-02-21 DIAGNOSIS — Z23 Encounter for immunization: Secondary | ICD-10-CM

## 2020-02-21 DIAGNOSIS — I129 Hypertensive chronic kidney disease with stage 1 through stage 4 chronic kidney disease, or unspecified chronic kidney disease: Secondary | ICD-10-CM

## 2020-02-21 DIAGNOSIS — Z6837 Body mass index (BMI) 37.0-37.9, adult: Secondary | ICD-10-CM

## 2020-02-21 MED ORDER — MAGNESIUM 400 MG PO TABS: ORAL_TABLET | ORAL | 1 refills | Status: DC

## 2020-02-21 NOTE — Patient Instructions (Signed)

## 2020-02-21 NOTE — Progress Notes (Signed)
I,Yamilka [employer] as a Education administrator for [name], MD.,have documented all relevant documentation on the behalf of [name], MD,as directed by  [name], MD while in the presence of [name], MD. This visit occurred during the SARS-CoV-2 public health emergency.  Safety protocols were in place, including screening questions prior to the visit, additional usage of staff PPE, and extensive cleaning of exam room while observing appropriate contact time as indicated for disinfecting solutions.  Subjective:     Patient ID: Leslie Davis , female    DOB: [birth year] , 64 y.o.   MRN: [id number]   Chief Complaint  Patient presents with  . Diabetes  . Hypertension    HPI  Patient here for a f/u on her diabetes/htn. She reports she has made some changes to her diet. She thinks her sugars have improved.   Diabetes She presents for her follow-up diabetic visit. She has type 2 diabetes mellitus. Her disease course has been stable. There are no hypoglycemic associated symptoms. Associated symptoms include fatigue. Pertinent negatives for diabetes include no blurred vision, no polydipsia, no polyphagia and no polyuria. There are no hypoglycemic complications. Diabetic complications include nephropathy. Risk factors for coronary artery disease include diabetes mellitus, dyslipidemia, hypertension and post-menopausal. She participates in exercise intermittently. An ACE inhibitor/angiotensin II receptor blocker is being taken. Eye exam is current.  Hypertension This is a chronic problem. The current episode started more than 1 year ago. The problem has been gradually improving since onset. Pertinent negatives include no blurred vision. Risk factors for coronary artery disease include dyslipidemia and diabetes mellitus. The current treatment provides moderate improvement.     Past Medical History:  Diagnosis Date  . DM (diabetes mellitus) ([place])   . Fatty liver disease,  nonalcoholic   . Gastritis   . Gastroparesis   . GERD (gastroesophageal reflux disease)   . Hiatal hernia   . Hiatal hernia   . HLD (hyperlipidemia)   . HTN (hypertension)   . Renal calculus      Family History  Problem Relation Age of Onset  . Diabetes Mother   . Diabetes Maternal Grandmother   . Stomach cancer Maternal Aunt        X 2 aunts  . Hypertension Father   . Colon cancer Neg Hx   . Esophageal cancer Neg Hx   . Rectal cancer Neg Hx      Current Outpatient Medications:  .  amLODipine (NORVASC) 10 MG tablet, Take 1 tablet (10 mg total) by mouth daily., Disp: 90 tablet, Rfl: 2 .  aspirin EC 81 MG tablet, Take 1 tablet (81 mg total) by mouth daily., Disp: 90 tablet, Rfl: 2 .  Azilsartan Medoxomil (EDARBI) 80 MG TABS, TAKE 1 TABLET EACH DAY., Disp: 90 tablet, Rfl: 2 .  benzonatate (TESSALON PERLES) 100 MG capsule, Take 1 capsule (100 mg total) by mouth every 6 (six) hours as needed for cough., Disp: 30 capsule, Rfl: 1 .  Blood Glucose Monitoring Suppl (ONETOUCH VERIO) w/Device KIT, Use as directed to check blood sugars 2 times per day dx: e11.65, Disp: 1 kit, Rfl: 1 .  cetirizine (ZYRTEC) 10 MG tablet, Take 1 tablet (10 mg total) by mouth daily., Disp: 90 tablet, Rfl: 2 .  Cholecalciferol (VITAMIN D3) 1.25 MG (50000 UT) CAPS, Take 1 capsule by mouth on Tuesday and Friday, Disp: 24 capsule, Rfl: 1 .  ferrous gluconate (FERGON) 324 MG tablet, TAKE 1 TABLET BY MOUTH TWICE DAILY WITH  A MEAL., Disp: 100 tablet, Rfl: 0 .  insulin glargine, 1 Unit Dial, (TOUJEO SOLOSTAR) 300 UNIT/ML Solostar Pen, Inject 50 units subcutaneous at bedtime, Disp: 22.5 mL, Rfl: 1 .  insulin lispro (HUMALOG KWIKPEN) 100 UNIT/ML KwikPen, Sliding scale, Disp: 30 mL, Rfl: 2 .  Insulin Pen Needle 32G X 6 MM MISC, Use with pen injectors daily dx code: e11.9, Disp: 150 each, Rfl: 3 .  OneTouch Delica Lancets 29F MISC, Use as directed to check blood sugars 2 times per day dx:e11.65, Disp: 150 each, Rfl: 3 .   pantoprazole (PROTONIX) 40 MG tablet, Take one tablet by mouth 3 days a week, Disp: 90 tablet, Rfl: 2 .  rosuvastatin (CRESTOR) 20 MG tablet, Take 1 tablet (20 mg total) by mouth daily., Disp: 90 tablet, Rfl: 1 .  Semaglutide,0.25 or 0.5MG/DOS, (OZEMPIC, 0.25 OR 0.5 MG/DOSE,) 2 MG/1.5ML SOPN, Inject 0.25 mg into the skin once a week., Disp: 1.5 mL, Rfl: 2 .  temazepam (RESTORIL) 30 MG capsule, One capsules po qhs prn, Disp: 60 capsule, Rfl: 0 .  traMADol (ULTRAM) 50 MG tablet, Take 1 tablet (50 mg total) by mouth every 12 (twelve) hours as needed., Disp: 30 tablet, Rfl: 0 .  Continuous Blood Gluc Receiver (FREESTYLE LIBRE 14 DAY READER) DEVI, by Does not apply route. (Patient not taking: Reported on 02/21/2020), Disp: , Rfl:  .  Continuous Blood Gluc Sensor (FREESTYLE LIBRE 14 DAY SENSOR) MISC, Use as directed to check blood sugars 4 times per day dx: e11.65 (Patient not taking: Reported on 02/21/2020), Disp: 2 each, Rfl: 4 .  glucose blood (ONETOUCH VERIO) test strip, Use as instructed to check blood sugars 2 times per day dx:e11.65, Disp: 150 each, Rfl: 3 .  Magnesium 400 MG TABS, One tab po qpm, Disp: 90 tablet, Rfl: 1   Allergies  Allergen Reactions  . Fiasp [Insulin Aspart (W-Niacinamide)] Hives     Review of Systems  Constitutional: Positive for fatigue.  Eyes: Negative for blurred vision.  Respiratory: Negative.   Cardiovascular: Negative.   Gastrointestinal: Negative.   Endocrine: Negative for polydipsia, polyphagia and polyuria.  Neurological: Negative.   Psychiatric/Behavioral: Negative.      Today's Vitals   02/21/20 1128  BP: (!) 152/68  Pulse: 74  Temp: 98.4 F (36.9 C)  TempSrc: Oral  Weight: 216 lb (98 kg)  Height: 5' 4" (1.626 m)  PainSc: 0-No pain   Body mass index is 37.08 kg/m.  Wt Readings from Last 3 Encounters:  02/21/20 216 lb (98 kg)  01/18/20 213 lb (96.6 kg)  11/15/19 209 lb (94.8 kg)   BP Readings from Last 3 Encounters:  02/21/20 (!) 152/68   11/15/19 128/78  11/15/19 128/78   Objective:  Physical Exam Constitutional:      General: She is not in acute distress.    Appearance: Normal appearance. She is obese.  HENT:     Head: Normocephalic and atraumatic.  Cardiovascular:     Rate and Rhythm: Normal rate and regular rhythm.     Pulses: Normal pulses.     Heart sounds: Normal heart sounds.  Pulmonary:     Effort: Pulmonary effort is normal.     Breath sounds: Normal breath sounds.  Abdominal:     General: Abdomen is flat.     Palpations: Abdomen is soft.  Skin:    General: Skin is warm and dry.     Capillary Refill: Capillary refill takes less than 2 seconds.  Neurological:     General:  No focal deficit present.     Mental Status: She is alert and oriented to person, place, and time.  Psychiatric:        Mood and Affect: Mood normal.        Behavior: Behavior normal.        Thought Content: Thought content normal.        Judgment: Judgment normal.         Assessment And Plan:     1. Type 2 diabetes mellitus with stage 2 chronic kidney disease, with long-term current use of insulin ([facility]) Comments: Chronic, I will check labs as listed below.   I will adjust meds as needed. Importance of medication/dietary compliance was stressed to the patient.  - BMP8+EGFR - Hemoglobin A1c  2. Hypertensive nephropathy Comments: Chronic, uncontrolled . She will c/w current meds for now. I will send rx magnesium for her to take in the evening. I will adjust meds at next visit if needed.  3. Other fatigue Comments: Chronic. Likely related to elevated blood sugar readings. Encouraged to stay well hydrated.   4. Need for influenza vaccination Comments: She was given high dose flu vaccine.  - Flu Vaccine QUAD High Dose(Fluad)  5. Class 2 severe obesity due to excess calories with serious comorbidity and body mass index (BMI) of 37.0 to 37.9 in adult [facility]) Comments: She was made aware of 7 pound weight gain since August 2021.  Encouraged to increase her daily activity, aiming for 150 minutes of exercise per week.      Patient was given opportunity to ask questions. Patient verbalized understanding of the plan and was able to repeat key elements of the plan. All questions were answered to their satisfaction.  [name], MD   I, [name], MD, have reviewed all documentation for this visit. The documentation on 03/02/20 for the exam, diagnosis, procedures, and orders are all accurate and complete.  THE PATIENT IS ENCOURAGED TO PRACTICE SOCIAL DISTANCING DUE TO THE COVID-19 PANDEMIC.

## 2020-02-22 LAB — BMP8+EGFR
BUN/Creatinine Ratio: 21 (ref 12–28)
BUN: 24 mg/dL (ref 8–27)
CO2: 21 mmol/L (ref 20–29)
Calcium: 9.5 mg/dL (ref 8.7–10.3)
Chloride: 100 mmol/L (ref 96–106)
Creatinine, Ser: 1.15 mg/dL — ABNORMAL HIGH (ref 0.57–1.00)
GFR calc Af Amer: 57 mL/min/{1.73_m2} — ABNORMAL LOW (ref >59)
GFR calc non Af Amer: 50 mL/min/{1.73_m2} — ABNORMAL LOW (ref >59)
Glucose: 148 mg/dL — ABNORMAL HIGH (ref 65–99)
Potassium: 4.7 mmol/L (ref 3.5–5.2)
Sodium: 141 mmol/L (ref 134–144)

## 2020-02-22 LAB — HEMOGLOBIN A1C
Est. average glucose Bld gHb Est-mCnc: 206 mg/dL
Hgb A1c MFr Bld: 8.8 % — ABNORMAL HIGH (ref 4.8–5.6)

## 2020-02-25 ENCOUNTER — Telehealth: Payer: Self-pay

## 2020-02-28 ENCOUNTER — Telehealth: Payer: Self-pay

## 2020-02-28 NOTE — Telephone Encounter (Signed)
-----   Message from [name], MD sent at 02/24/2020  6:21 PM EST ----- Your kidney function is slightly decreased from last visit. Be sure to stay well hydrated. Your hba1c has improved, it is down to 8.8 Congrats! We need to get it down below 7.5. Please cut out all sodas and juices. Please call in morning sugars every Monday and Thursday so your insulin can be adjusted.

## 2020-02-28 NOTE — Telephone Encounter (Signed)
Left the patient a message to call back for lab results. 

## 2020-02-28 NOTE — Telephone Encounter (Signed)
Returned pt call LVM asking pt to call in A1c readings every Mon-Thur morning she can LVM

## 2020-03-03 ENCOUNTER — Telehealth: Payer: Self-pay

## 2020-03-03 NOTE — Telephone Encounter (Signed)
Please advise patient she has to give me all sugars from the weekend Th, F, Sat Sun and Mon so I can adjust her insulin. Thx

## 2020-03-03 NOTE — Telephone Encounter (Signed)
Pt LVM with blood sugar reading this morning 192

## 2020-03-03 NOTE — Chronic Care Management (AMB) (Signed)
Chronic Care Management Pharmacy Assistant   Name: Leslie Davis  MRN: [id number] DOB: 08-Dec-1955  Reason for Encounter: Medication Review  Patient Questions:  1.  Have you seen any other providers since your last visit? Yes, 02/05/2020- Dr. Erlinda Hong (Orthopedics), 02/21/2020- Dr Baird Cancer (PCP)  2.  Any changes in your medicines or health? Yes, 02/08/2020- Tramadol 50 mg changed from every 12 hours prn to every 6 hours prn.  02/21/2020- Magnesium 400 mg added, Tramadol 50 mg changed from every 6 hours prn to every 12 hours prn.     PCP : [name], MD  Allergies:   Allergies  Allergen Reactions   Fiasp [Insulin Aspart (W-Niacinamide)] Hives    Medications: Outpatient Encounter Medications as of 03/03/2020  Medication Sig   amLODipine (NORVASC) 10 MG tablet Take 1 tablet (10 mg total) by mouth daily.   aspirin EC 81 MG tablet Take 1 tablet (81 mg total) by mouth daily.   Azilsartan Medoxomil (EDARBI) 80 MG TABS TAKE 1 TABLET EACH DAY.   benzonatate (TESSALON PERLES) 100 MG capsule Take 1 capsule (100 mg total) by mouth every 6 (six) hours as needed for cough.   Blood Glucose Monitoring Suppl (ONETOUCH VERIO) w/Device KIT Use as directed to check blood sugars 2 times per day dx: e11.65   cetirizine (ZYRTEC) 10 MG tablet Take 1 tablet (10 mg total) by mouth daily.   Cholecalciferol (VITAMIN D3) 1.25 MG (50000 UT) CAPS Take 1 capsule by mouth on Tuesday and Friday   Continuous Blood Gluc Receiver (FREESTYLE LIBRE 14 DAY READER) DEVI by Does not apply route. (Patient not taking: Reported on 02/21/2020)   Continuous Blood Gluc Sensor (FREESTYLE LIBRE 14 DAY SENSOR) MISC Use as directed to check blood sugars 4 times per day dx: e11.65 (Patient not taking: Reported on 02/21/2020)   ferrous gluconate (FERGON) 324 MG tablet TAKE 1 TABLET BY MOUTH TWICE DAILY WITH A MEAL.   glucose blood (ONETOUCH VERIO) test strip Use as instructed to check blood sugars 2 times per day  dx:e11.65   insulin glargine, 1 Unit Dial, (TOUJEO SOLOSTAR) 300 UNIT/ML Solostar Pen Inject 50 units subcutaneous at bedtime   insulin lispro (HUMALOG KWIKPEN) 100 UNIT/ML KwikPen Sliding scale   Insulin Pen Needle 32G X 6 MM MISC Use with pen injectors daily dx code: e11.9   Magnesium 400 MG TABS One tab po qpm   OneTouch Delica Lancets 76E MISC Use as directed to check blood sugars 2 times per day dx:e11.65   pantoprazole (PROTONIX) 40 MG tablet Take one tablet by mouth 3 days a week   rosuvastatin (CRESTOR) 20 MG tablet Take 1 tablet (20 mg total) by mouth daily.   Semaglutide,0.25 or 0.5MG /DOS, (OZEMPIC, 0.25 OR 0.5 MG/DOSE,) 2 MG/1.5ML SOPN Inject 0.25 mg into the skin once a week.   temazepam (RESTORIL) 30 MG capsule One capsules po qhs prn   traMADol (ULTRAM) 50 MG tablet Take 1 tablet (50 mg total) by mouth every 12 (twelve) hours as needed.   No facility-administered encounter medications on file as of 03/03/2020.    Current Diagnosis: Patient Active Problem List   Diagnosis Date Noted   AKI (acute kidney injury) ([place]) 09/07/2019   Acute kidney injury ([place]) 09/06/2019   Bradycardia 09/06/2019   Hyperglycemia 09/06/2019   HLD (hyperlipidemia)    Hypertensive nephropathy 05/16/2019   Erosive gastritis 11/16/2017   Fatty liver 11/16/2017   Abdominal pain 12/11/2012   Early satiety 12/11/2012   Hiatal hernia  Hx of cholecystectomy 02/05/2011   Flu-like symptoms 02/05/2011   CONSTIPATION 08/27/2008   FATTY LIVER DISEASE 05/07/2008   Nausea with vomiting 04/23/2008   EPIGASTRIC PAIN 04/23/2008   RENAL CALCULUS 04/22/2008   Type 2 diabetes mellitus with stage 2 chronic kidney disease, with long-term current use of insulin ([place]) 05/19/2006   Type 2 diabetes mellitus with hyperlipidemia ([place]) 05/19/2006   HYPERTENSION, BENIGN SYSTEMIC 05/19/2006   GASTROESOPHAGEAL REFLUX, NO ESOPHAGITIS 05/19/2006   AMENORRHEA 05/19/2006   Reviewed chart  for medication changes ahead of medication coordination call.  OVs, Consults-02/05/2020- Dr. Erlinda Hong (Orthopedics), 02/21/2020- Dr Baird Cancer (PCP) since last care coordination call/Pharmacist visit.  Medication changes indicated:  02/08/2020- Tramadol 50 mg changed from every 12 hours prn to every 6 hours prn.  02/21/2020- Magnesium 400 mg added, Tramadol 50 mg changed from every 6 hours prn to every 12 hours prn.   BP Readings from Last 3 Encounters:  02/21/20 (!) 152/68  11/15/19 128/78  11/15/19 128/78    Lab Results  Component Value Date   HGBA1C 8.8 (H) 02/21/2020     Patient obtains medications through Vials  90 Days   Last adherence delivery included:  Pantoprazole 40 mg -1 tablet every other day Vitamin D 50000 units -1 capsule Tuesday and Friday Tramadol 50 mg- 1 tablet by mouth three times daily as needed Ozempic 2/1.5 ml- Inject 0.25 mg sq once a week  Patient declined the following medications last month:  Clindamycin lotion 1%- apply to skin in the morning daily due to PRN use received a supply on 01/09/2020. Ferrous Gluconate 324 mg- 1 tablet twice daily due to receiving a 90 day supply on 01/09/2020. Toujeo -50 unitssq nightly due to receiving an 81 day supply on 01/09/2020. Flonase NS 1-2 sprayseach nostril once daily. Due to PRN use received a 90 day supply on 10/20/201.  Humalog Kwikpen 100 units/ml- Inject as directed per sliding scale Amlodipine 10 mg-1 tablet daily Rosuvastatin20 mg- 1 tablet daily Cetirizine 10 mg-1 tablet daily ASA 81 mg- 1 tablet daily Edarbi 80 mg-1 tablet daily Hydrochlorothiazide 12.5 mg- 1 tablet daily              due to receiving a 90 day supply on 12/13/2019. Crestor 20 mg- 1 tablet daily due to receiving a 90 day supply on 12/12/2019.    Patient is due for next adherence delivery on: 03/10/2020. Called patient and reviewed medications and coordinated delivery.  This delivery to include: Amlodipine 10 mg-1 tablet  daily Rosuvastatin20 mg- 1 tablet daily Cetirizine 10 mg-1 tablet daily ASA 81 mg- 1 tablet daily Edarbi 80 mg-1 tablet daily Hydrochlorothiazide 12.5 mg- 1 tablet daily  Crestor 20 mg- 1 tablet daily Pantoprazole 40 mg -1 tablet every other day Tramadol 50 mg- 1 tablet by mouth three times daily as needed Pantoprazole 40 mg- 1 tablet three times a week. Onetouch Verio Test Strips- Use to check blood sugars twice daily Trueplus Pen 31g 19mm- Use with pen to inject insulin daily Temazepam 30 mg- 1 capsule at bedtime as needed.   No short fill or acute fill needed.  Patient declined the following medications: Clindamycin lotion 1%- apply to skin in the morning daily due to PRN use received a supply on 01/09/2020. Ferrous Gluconate 324 mg- 1 tablet twice daily due to receiving a 90 day supply on 01/09/2020. Toujeo -50 unitssq nightly due to receiving an 81 day supply on 01/09/2020. Flonase NS 1-2 sprayseach nostril once daily. Due to PRN use received a  90 day supply on 10/20/201. Humalog Kwikpen 100 units/ml- Inject as directed per sliding scale due to having an adequate supply. Magnesium Oxide 400 mg- 1 tablet every evening due to having a #30 on hand. Patient states she used all of the 200 mg tablets that were sent a month ago, she took 2 a day to get the 400 mg dosage.   Vitamin D 50000 units -1 capsule Tuesday and Friday  Tramadol 50 mg- 1 tablet by mouth three times daily as needed Ozempic 2/1.5 ml- Inject 0.25 mg sq once a week  Due to receiving a 90 day supply on 02/08/2020.  Patient needs refills for- None.  Confirmed delivery date of 03/10/2020 , advised patient that pharmacy will contact them the morning of delivery.  Follow-Up:  Coordination of Enhanced Pharmacy Services and Pharmacist Review  [name], CPP notified.  Pattricia Boss, [facility] Pharmacist Assistant [phone]

## 2020-03-03 NOTE — Telephone Encounter (Signed)
Pt stated she will call back with the readings

## 2020-03-06 ENCOUNTER — Telehealth: Payer: Self-pay

## 2020-03-06 NOTE — Telephone Encounter (Signed)
The pt called in her fasting morning sugars for the week. Monday 193, Tuesday 146, Wednesday 129, Thursday 102.  I left the pt a message that DR Baird Cancer wants to know how the pt is feeling?

## 2020-03-10 ENCOUNTER — Other Ambulatory Visit: Payer: Self-pay

## 2020-03-12 ENCOUNTER — Telehealth: Payer: Medicare Other

## 2020-03-13 ENCOUNTER — Telehealth: Payer: Self-pay

## 2020-03-13 NOTE — Telephone Encounter (Signed)
Blood sugars for the week Monday 167, Tues 156, Wendes 157, today 193 I

## 2020-03-22 HISTORY — PX: JOINT REPLACEMENT: SHX530

## 2020-03-25 ENCOUNTER — Telehealth: Payer: Self-pay

## 2020-03-25 NOTE — Telephone Encounter (Signed)
-----   Message from Dorothyann Peng, MD sent at 03/25/2020  4:32 PM EST ----- These are very good! She should continue with current regimen.   How is she feeling?   RS ----- Message ----- From: Mariam Dollar, CMA Sent: 03/25/2020  11:37 AM EST To: Dorothyann Peng, MD  The pt said these are her blood sugars readings for last week. Monday 126, Tues 142, Wednes 120, Thurs 103.

## 2020-03-26 ENCOUNTER — Other Ambulatory Visit: Payer: Self-pay | Admitting: Internal Medicine

## 2020-03-26 ENCOUNTER — Telehealth: Payer: Self-pay

## 2020-03-26 DIAGNOSIS — Z1231 Encounter for screening mammogram for malignant neoplasm of breast: Secondary | ICD-10-CM

## 2020-03-26 NOTE — Chronic Care Management (AMB) (Signed)
03/26/2020- Called and spoke with the patient to remind her of upcoming CCM telephone appointment 03/27/2020 at 9:30 a.m. with Cherylin Mylar, CPP. Informed the patient to have all medications and supplements nearby during phone call so that CPP, Floydene Flock can review and confirm. The patient verbalized understanding.   Notified CPP, Cherylin Mylar.  Suezanne Cheshire, [facility] Clinical Pharmacist Assistant [phone]

## 2020-03-27 ENCOUNTER — Ambulatory Visit: Payer: Self-pay

## 2020-03-27 DIAGNOSIS — E1122 Type 2 diabetes mellitus with diabetic chronic kidney disease: Secondary | ICD-10-CM

## 2020-03-27 DIAGNOSIS — N182 Chronic kidney disease, stage 2 (mild): Secondary | ICD-10-CM

## 2020-03-27 DIAGNOSIS — I10 Essential (primary) hypertension: Secondary | ICD-10-CM

## 2020-03-27 NOTE — Chronic Care Management (AMB) (Signed)
Chronic Care Management Pharmacy  Name: Leslie Davis  MRN: [id number] DOB: [birth year]  Chief Complaint/ HPI  Leslie Davis,  65 y.o. , female presents for their Follow-Up CCM visit with the clinical pharmacist via telephone due to COVID-19 Pandemic. She used to work at [employer]. She has two boys and she works a part time job. She works at the [employer]. Patient has a goal to decrease the amount of medication that she is taking.   PCP : Leslie Chard, MD  Their chronic conditions include: Hypertension, Diabetes with CKD, Hyperlipidemia, GERD, Insomnia  Office Visits:  02/21/20 MD OV: HTN Nephropathy: Magnesium added to patients regimen, Fluad High Dose given   02/07/2020- pt started on Temazepam 30 mg capsule at bedtime as needed, Tramadol 50 mg take 1 tablet every 12 hours as needed.   11/15/19 AWV and OV: Physical exam.  HgbA1c up to 10.5%. Advised pt to check BG before breakfast, lunch, and dinner and keep a logbook. Referred to Endocrinologist. Vitamin D level is low. Start Vitamin D 50000 units twice weekly (Tuesday/Friday). LDL is 109, above goal of less than 70. Take rosuvastatin daily. Kidney functio stable. Blood count normal. Diabetic foot exam performed. HTN controlled. EKG performed, NSR w/ T wave abnormality.   10/31/19 Temazepam 27m ordered for insomnia as needed  10/10/19 OV: Hospital follow up. Diabetic foot exam performed.   10/08/19 Telephone call: Pt instructed to increase Toujeo to 50 units at bedtime and call with [facility] on Tuesdays and Thursdays. Appt scheduled for Wednesday.   09/11/19 OV: Toujeo increased to 47 units daily and Fiasp started per sliding scale. Stop FWilder Gladedue to acute kidney injury.   08/13/19 OV: Presented for diabetes check. Pt reported that she would like to stop metformin because of side effects (diarrhea). Metformin discontinued and started Farxiga 165mdaily. Pt advised to stay well hydrated and follow up in 4  weeks. Labs ordered (HgbA1c, BMP8+EGFR, Lipid panel). Will consider discontinuing HCTZ at next visit.   05/16/19 OV: Presented for DM and HTN follow up. Pt complained of GI issues with metformin. Advised to change metformin dose to once daily. Labs ordered (BMP8+EGFR, HgbA1c, ANA, IFA w/ reflex, TSH, Testosterone total). Referred to Dermatology for evaluation of hair thinning. Dietary and exercise recommendations provided. Exercise and weight loss encouraged.   02/08/19 OV: Presented for routine wellness examination. Labs ordered (Hgb A1c, lipid panel). EKG done with no new changes noted.   Consult Visits: 02/05/2020 [facility]: seen due to primary osteoarthritis of right knee, patient given right knee injection 2 mL lidocaine 1 %; 2 mL bupivacaine 0.25 %; 48 mg Hylan 48 MG/6ML   10/06/19 ED admission: Elevated BG since having cortisone shots. Most recent reading was in to 400s. Also complained of polyuria and polydipsia. Labs did not show signs of DKA. Presentation not consistent with HHS. No sign of hypertensive emergency. Given fluids and insulin and hyperglycemia improved. Recommend supportive care, dietary changes and discussion with PCP about medication changes.   10/03/19 Orthopedic OV w/ C. Robbins: Kenalog injections in both knees for progressing mild to moderate tricompartmental degenerative changes. Discussed activity modification, ice, and anti-inflammatories.   09/27/19 No show for sleep study  09/27/19 Orthopedic surgery telephone call: Pt notified she could not be prescribed pain medications due to not being seen in 2 months and missing last post-op visit. Advised pt to schedule appt if she feels like she needs to be seen. Recommend OTC pain medications (Tylenol, ibuprofen, or naproxen).  09/06/19-09/08/19 ED admission: Presented for evaluation of generalized fatigue and weakness occurring for the last 2 weeks. Pt reported symptoms are getting worse. Some generalized muscle spasms also noted.  Mild dyspnea present. Headache present but no vision changes. Pt also noted BG has been running higher than normal. Creatinine significantly elevated compared to 5/24. Mild hyponatremia. Glucose elevated without evidence of ketoacidosis. ECG showed nonspecific T wave changes (new compared to 2020). Acute kidney injury possibly due to new medication, Farxiga. Hold HCTZ and ARB due to AKI.   07/26/19 Orthopedic surgery OV w/ Dr. Clance Boll and PT: Presented for follow up 5 weeks post left knee arthroscopy w/ partial medial menisectomy. Pt reported pain she experienced before surgery is gone. Physical therapy goals completed. Pt to continue home exercise program. Follow up in 6 weeks.   06/26/19 Sports Medicine OV w/ C. Robbins: Presented for follow up 1 week post knee arthroscopy  and partial menisectomy and reported doing well. Pt to progress to outpatient physical therapy. Follow up in 4 weeks.    06/20/19: Left knee arthroscopy and partial medial menisectomy performed by C. Unk Lightning  05/23/19 Sports Medicine OV w/ C. Robbins: Presented for second opinion for chronic left knee pain. Past treatments have included cortisone injections and gel lubrication injection. Pt advised to wait 2 more weeks to determine if gel lubrication injection provides any symptom resolution. If no relief, pt will have knee scope for partial meniscectomy.   05/08/19 Orthopedic OV w. Dr. Case: Presented for left knee Monovisc injection for left knee osteoarthritis  05/07/19 ED visit: Presented for injury to the right shoulder that occurred yesterday while performing weight assisted pull ups. Pt reported moderate to severe, throbbing pain. Follow up with orthopedic. Pt given sling for comfort.   05/02/19 Orthopedic OV w. Dr. Case: Presented for left knee Monovisc injection for left knee osteoarthritis  02/06/19 Physical Therapy for generalized muscle weakness, chronic left knee pain, joint stiffness, and difficulty walking (2x  weekly)  CCM Encounters: 08/28/19 SW: Contacted pt to assist with obtaining BP meter, but pt stated she had already gotten one.   08/14/19 RN: Review of diabetes treatment regimen and discussed appropriate goals. Evaluated current treatment plan for hypertriglyceridemia. Pt education provided.   07/18/19 RN: Reviewed current treatment plans relating to acute and chronic conditions. Provided patient education.   06/11/19 PharmD: Comprehensive medication review performed. Toujeo increased to 34 units daily. Dietary and exercise recommendations provided.   03/30/19 RN: Evaluation of current treatment plans and patient education relating to disease states, diet, and exercise.   02/19/19 PharmD: Comprehensive medication review performed  02/12/19 RN: Evaluation of current treatment plans and patient education relating to disease states, diet, and exercise. Collaboration with PharmD regarding medications.  Medications: Outpatient Encounter Medications as of 03/27/2020  Medication Sig  . amLODipine (NORVASC) 10 MG tablet Take 1 tablet (10 mg total) by mouth daily.  Marland Kitchen aspirin EC 81 MG tablet Take 1 tablet (81 mg total) by mouth daily.  . Azilsartan Medoxomil (EDARBI) 80 MG TABS TAKE 1 TABLET EACH DAY.  . benzonatate (TESSALON PERLES) 100 MG capsule Take 1 capsule (100 mg total) by mouth every 6 (six) hours as needed for cough.  . Blood Glucose Monitoring Suppl (ONETOUCH VERIO) w/Device KIT Use as directed to check blood sugars 2 times per day dx: e11.65  . cetirizine (ZYRTEC) 10 MG tablet Take 1 tablet (10 mg total) by mouth daily.  . Cholecalciferol (VITAMIN D3) 1.25 MG (50000 UT) CAPS Take 1 capsule by  mouth on Tuesday and Friday  . Continuous Blood Gluc Receiver (FREESTYLE LIBRE 14 DAY READER) DEVI by Does not apply route. (Patient not taking: Reported on 02/21/2020)  . Continuous Blood Gluc Sensor (FREESTYLE LIBRE 14 DAY SENSOR) MISC Use as directed to check blood sugars 4 times per day dx: e11.65  (Patient not taking: Reported on 02/21/2020)  . ferrous gluconate (FERGON) 324 MG tablet TAKE 1 TABLET BY MOUTH TWICE DAILY WITH A MEAL.  Marland Kitchen glucose blood (ONETOUCH VERIO) test strip Use as instructed to check blood sugars 2 times per day dx:e11.65  . insulin glargine, 1 Unit Dial, (TOUJEO SOLOSTAR) 300 UNIT/ML Solostar Pen Inject 50 units subcutaneous at bedtime  . insulin lispro (HUMALOG KWIKPEN) 100 UNIT/ML KwikPen Sliding scale  . Insulin Pen Needle 32G X 6 MM MISC Use with pen injectors daily dx code: e11.9  . Magnesium 400 MG TABS One tab po qpm  . OneTouch Delica Lancets 62I MISC Use as directed to check blood sugars 2 times per day dx:e11.65  . pantoprazole (PROTONIX) 40 MG tablet Take one tablet by mouth 3 days a week  . rosuvastatin (CRESTOR) 20 MG tablet Take 1 tablet (20 mg total) by mouth daily.  . Semaglutide,0.25 or 0.5MG/DOS, (OZEMPIC, 0.25 OR 0.5 MG/DOSE,) 2 MG/1.5ML SOPN Inject 0.25 mg into the skin once a week.  . temazepam (RESTORIL) 30 MG capsule One capsules po qhs prn  . traMADol (ULTRAM) 50 MG tablet Take 1 tablet (50 mg total) by mouth every 12 (twelve) hours as needed.   No facility-administered encounter medications on file as of 03/27/2020.   Current Diagnosis/Assessment:   Goals Addressed            This Visit's Progress   . Pharmacy Care Plan       CARE PLAN ENTRY  Current Barriers:  . Chronic Disease Management support, education, and care coordination needs related to Hypertension and Diabetes   Hypertension . Pharmacist Clinical Goal(s): o Over the next 180 days, patient will work with PharmD and providers to maintain BP goal <130/80 . Current regimen:  o Amlodipine 478m daily o Edarbi 8101mdaily o Hydrochlorothiazide 12.78m37maily . Interventions: o Provided dietary and exercise recommendations o Mailed diet recommendations . Patient self care activities - Over the next 45 days, patient will: o Check BP daily and if symptomatic, document, and  provide at future appointments o Ensure daily salt intake < 2300 mg/day o Continue exercise regimen (at least 30 minutes of exercise 5 times weekly or 150 minutes per week) o Focus on healthy, balanced diet, limiting salt intake  Hyperlipidemia . Pharmacist Clinical Goal(s): o Over the next 90 days, patient will work with PharmD and providers to maintain BP goal <130/80   Diabetes . Pharmacist Clinical Goal(s): o Over the next 45 days, patient will work with PharmD and providers to achieve A1c goal <7% . Current regimen:  o Toujeo Solostar 50 units daily o Ozempic 0.278m83mekly o Humalog per sliding scale . Interventions: o Discussed diet recommendations and meal planning in depth o Provided patient with carbohydrate counting and meal planning books at recent office visit o Discussed recent Hemoglobin A1c increase . Patient self care activities - Over the next 45 days, patient will: o Check blood sugar before meals, document, and provide at future appointments o Contact provider with any episodes of hypoglycemia o Continue exercise regimen (at least 30 minutes of exercise 5 times weekly or 150 minutes per week) o Focus on a healthy, well-balanced  diet limiting fried foods and sugary foods  Medication management . Pharmacist Clinical Goal(s): o Over the next 90 days, patient will work with PharmD and providers to maintain optimal medication adherence . Current pharmacy: [facility] . Interventions o Comprehensive medication review performed. o Utilize [facility] for medication synchronization, packaging and delivery . Patient self care activities - Over the next 45 days, patient will: o Focus on medication adherence by medication synchronization and using adherence packaging o Take medications as prescribed o Report any questions or concerns to PharmD and/or provider(s)  Please see past updates related to this goal by clicking on the "Past Updates" button in the  selected goal         Diabetes   Goal <7% Recent Relevant Labs: Lab Results  Component Value Date/Time   HGBA1C 8.8 (H) 02/21/2020 03:00 PM   HGBA1C 10.5 (H) 11/15/2019 10:29 AM   MICROALBUR 150 11/15/2019 10:20 AM    Kidney Function Lab Results  Component Value Date/Time   CREATININE 1.15 (H) 02/21/2020 03:00 PM   CREATININE 1.11 (H) 11/15/2019 10:29 AM   CREATININE 0.74 08/30/2011 01:07 PM   GFR 77.09 11/17/2017 09:00 AM   GFRNONAA 50 (L) 02/21/2020 03:00 PM   GFRAA 57 (L) 02/21/2020 03:00 PM    Checking BG: Before meals - she calls in her BS with Dr. Baird Cancer every Thursday   Recent FBG Readings: 126,142,120,103 Recent pre-meal BG readings:  Recent 2 hr PP readings: 150, 182, 215 Patient has failed these meds in past: Tresiba, Levemir, Novolog 70/30, Januvia, Farxiga (AKI) Patient is currently uncontrolled on the following medications:   Toujeo Solostar 50 units daily  Humalog sliding scale  Ozempic 0.60m once weekly  Last diabetic Foot exam: 11/15/19 Last diabetic Eye exam:  Lab Results  Component Value Date/Time   HMDIABEYEEXA No Retinopathy 08/02/2019 12:00 AM     We discussed:  . Diet extensively o Patient has limited the amount of sugars that she is eating  . Exercise extensively o Going to the gym 2-3 times per week  . Humalog: Pt has been using with lunch and dinner per sliding scale  o Has not had to use as often sometimes with dinner she has to use 6-8 units  . Pt is administering Ozempic on Tuesdays o Denies side effects . Signs and symptoms of hypoglycemia  Plan Continue current medications   Hypertension  Goal BP < 130/80  Office blood pressures are  BP Readings from Last 3 Encounters:  02/21/20 (!) 152/68  11/15/19 128/78  11/15/19 128/78   Patient has failed these meds in the past: Losartan, Olmesartan/HCTZ Patient is currently uncontrolled on the following medications:   Amlodipine 193mdaily  Edarbi 8046maily   Patient  checks BP at home daily   The last time she checked her BP was 140/67 on 03/18/2020  She reports that her BP cuff does not always work right   We discussed how to properly check her BP   Patient will start checking her BP everyday and write in a log book    We discussed: -Patient no longer taking Hydrochlorothiazide  -Diet and Exercise in detail:  -Breakfast: Eggs, grits and bacon or [place] or a spinach omelet  -Lunch: Salad, or chicken  -Dinner: Eating what is left over for lunch   -Does not use salt, limits her seasoning to garlic   Plan Continue current medications, begin check BP once a day and recording it in a log book  CPA  to do BP monitoring assessment   Hyperlipidemia   Goal: LDL<70   Lipid Panel     Component Value Date/Time   CHOL 209 (H) 11/15/2019 1029   TRIG 73 11/15/2019 1029   HDL 87 11/15/2019 1029   CHOLHDL 2.4 11/15/2019 1029   CHOLHDL 2.8 09/13/2007 0405   VLDL 17 09/13/2007 0405   LDLCALC 109 (H) 11/15/2019 1029   LABVLDL 13 11/15/2019 1029    The 10-year ASCVD risk score Mikey Bussing DC Jr., et al., 2013) is: 29.6%   Values used to calculate the score:     Age: 35 years     Sex: Female     Is Non-Hispanic African American: Yes     Diabetic: Yes     Tobacco smoker: No     Systolic Blood Pressure: 242 mmHg     Is BP treated: Yes     HDL Cholesterol: 87 mg/dL     Total Cholesterol: 209 mg/dL   Patient has failed these meds in past: N/A Patient is currently controlled on the following medications:   Rosuvastatin 58m daily   Aspirin 821mdaily  We discussed:  Pt has been taking her medication every day since being with [facility]  Diet and exercise extensively  [place]    She spends an hour and a half working out- rides the bicycles, and does arm and leg work  Recommend limit fried foods and fatty foods  Plan Continue current medications  Lipid panel at next appointment  with PCP in March   GERD   Patient has failed these meds in past: Aciphex, Zantac Patient is currently controlled on the following medications:   Pantoprazole 4024maily  Plan Continue current medications   Anemia   Hgb: 11.3 (RR 12-15 g/dL) on 01/25/19  Patient has failed these meds in past: N/A Patient is currently controlled on the following medications:   Ferrous gluconate 324m63mice daily with a meal  Plan Continue current medications  Insomnia   Patient has failed these meds in past: N/A Patient is currently controlled on the following medications:   Temazepam 30mg42mbedtime as needed  Plan Continue current medications  Allergies   Patient has failed these meds in past: N/A Patient is currently controlled on the following medications:   Cetirizine 10mg 3my  Fluticasone nasal spray  Plan Continue current medications   Chronic Kidney Disease Stage 2   Lab Results  Component Value Date   CREATININE 1.15 (H) 02/21/2020   BUN 24 02/21/2020   GFR 77.09 11/17/2017   GFRNONAA 50 (L) 02/21/2020   GFRAA 57 (L) 02/21/2020   NA 141 02/21/2020   K 4.7 02/21/2020   CALCIUM 9.5 02/21/2020   CO2 21 02/21/2020    Patient has failed these meds in past: none noted at this time Patient is currently controlled on the following medications:  . Edarbi 80 mg- take daily . Amlodipine 10 mg take daily . Rosuvustatin 20 mg take daily  We discussed:   -Will discuss at next office visit   Plan  Continue current medications. All medications were reviewed for renal dosing.  Will collaborate with PCP team to determine if patients renal function needs to be evaluated again soon.    Osteoarthritis    Patient has failed these meds in past: none noted Patient is currently uncontrolled on the following medications:  . 02/05/2020- Right Knee Injection: right knee injection 2 mL  lidocaine 1 %; 2 mL bupivacaine 0.25 %; 48 mg Hylan 48 MG/6ML . Tramadol 50 mg take 1  tablet by mouth every 6 hours as needed   We discussed:   -Managed by [facility] provider.  -Will discuss at next office visit.   Plan  Continue current medications -Reviewed patients [place] PMP Aware System information   Vaccines   Reviewed and discussed patient's vaccination history.   Immunization History  Administered Date(s) Administered  . DTaP 03/26/2013  . Fluad Quad(high Dose 65+) 11/08/2018, 02/21/2020  . Influenza, High Dose Seasonal PF 11/08/2018  . Influenza-Unspecified 11/30/2017  . Moderna Sars-Covid-2 Vaccination 08/03/2019, 08/31/2019  . Td 08/20/2005   We discussed:  Pneumonia vaccines and Shingrix vaccine  Patient reports getting Pneumonia Vaccine in December.   Plan Recommend both pneumonia vaccines (Pneumovax and Prevnar) and Shingrix at appropriate intervals  Medication Management   Pt uses [facility] for all medications Uses pill box? Yes Pt endorses 100% compliance  We discussed:   The importance of taking medications daily as directed  Packaging and delivery of medications is going well. Pt reported no issues at this time. Next delivery scheduled this month  Plan Utilize [facility] for medication synchronization, packaging and delivery  Follow up: 6 week phone visit   [name], PharmD Clinical Pharmacist [facility] [phone]

## 2020-03-28 ENCOUNTER — Other Ambulatory Visit: Payer: Self-pay

## 2020-03-28 ENCOUNTER — Ambulatory Visit
Admission: RE | Admit: 2020-03-28 | Discharge: 2020-03-28 | Disposition: A | Discharge status: Home / Self Care | Payer: Medicare Other | Source: Ambulatory Visit | Attending: Internal Medicine | Admitting: Internal Medicine

## 2020-03-28 DIAGNOSIS — Z1231 Encounter for screening mammogram for malignant neoplasm of breast: Secondary | ICD-10-CM | POA: Diagnosis not present

## 2020-03-31 ENCOUNTER — Telehealth: Payer: Self-pay | Admitting: Orthopaedic Surgery

## 2020-03-31 NOTE — Telephone Encounter (Signed)
Can you make her an appt for me please

## 2020-03-31 NOTE — Telephone Encounter (Signed)
appt made

## 2020-03-31 NOTE — Telephone Encounter (Signed)
Pt called stating she got an injection in her knee back in November and recently her knee has been giving out and she would like a CB to discuss what could be the cause of this and how she can get it to stop   [phone]

## 2020-04-01 ENCOUNTER — Other Ambulatory Visit: Payer: Self-pay

## 2020-04-01 ENCOUNTER — Ambulatory Visit (INDEPENDENT_AMBULATORY_CARE_PROVIDER_SITE_OTHER): Payer: Medicare Other | Admitting: Orthopaedic Surgery

## 2020-04-01 ENCOUNTER — Encounter: Payer: Self-pay | Admitting: Orthopaedic Surgery

## 2020-04-01 VITALS — Ht 64.0 in | Wt 210.0 lb

## 2020-04-01 DIAGNOSIS — M1712 Unilateral primary osteoarthritis, left knee: Secondary | ICD-10-CM | POA: Diagnosis not present

## 2020-04-01 DIAGNOSIS — Z7689 Persons encountering health services in other specified circumstances: Secondary | ICD-10-CM

## 2020-04-01 NOTE — Addendum Note (Signed)
Addended by: Precious Bard on: 04/01/2020 09:53 AM   Modules accepted: Orders

## 2020-04-01 NOTE — Progress Notes (Signed)
Office Visit Note   Patient: Leslie Davis           Date of Birth: Aug 09, 1955           MRN: [id number] Visit Date: 04/01/2020              Requested by: [name], [address] [place],  [place] [address] PCP: [name], MD   Assessment & Plan: Visit Diagnoses:  1. Primary osteoarthritis of left knee     Plan: Impression is left knee DJD.  I reviewed the operative note which showed grade III chondromalacia of the medial compartment.  Based on her x-rays her medial patellofemoral compartment is likely causing most of her symptoms.  We had a lengthy discussion on nonsurgical versus surgical treatment options and she would like to avoid knee replacement if possible.  For now she will make all efforts at weight loss and strengthening.  Referral for physical therapy and health and wellness clinic made today.  She will follow-up with [place] as needed.  Follow-Up Instructions: Return if symptoms worsen or fail to improve.   Orders:  Orders Placed This Encounter  Procedures  . Ambulatory referral to Physical Therapy   No orders of the defined types were placed in this encounter.     Procedures: No procedures performed   Clinical Data: No additional findings.   Subjective: Chief Complaint  Patient presents with  . Left Knee - Pain    Leslie Davis is a 65 year old female who comes in for continued left knee pain.  She states that mainly she has anterior medial knee pain.  She no longer has the sharp stabbing pain that she had prior to the medial meniscectomy that was performed in March 2021 by Dr. Ronnie Derby.  She has a chronic aching pain that gives way while she is walking almost daily.  Denies any swelling or grinding.  She has had 3 cortisone injections that did not help significantly she has had a Visco injection that helped some.   Review of Systems  Constitutional: Negative.   HENT: Negative.   Eyes: Negative.   Respiratory: Negative.    Cardiovascular: Negative.   Endocrine: Negative.   Musculoskeletal: Negative.   Neurological: Negative.   Hematological: Negative.   Psychiatric/Behavioral: Negative.   All other systems reviewed and are negative.    Objective: Vital Signs: Ht 5\' 4"  (1.626 m)   Wt 210 lb (95.3 kg)   BMI 36.05 kg/m   Physical Exam Vitals and nursing note reviewed.  Constitutional:      Appearance: She is well-developed and well-nourished.  Pulmonary:     Effort: Pulmonary effort is normal.  Skin:    General: Skin is warm.     Capillary Refill: Capillary refill takes less than 2 seconds.  Neurological:     Mental Status: She is alert and oriented to person, place, and time.  Psychiatric:        Mood and Affect: Mood and affect normal.        Behavior: Behavior normal.        Thought Content: Thought content normal.        Judgment: Judgment normal.     Ortho Exam Left knee exam shows no joint effusion.  1+ crepitus with range of motion.  Collaterals are cruciates are stable.  Pain with range of motion. Specialty Comments:  No specialty comments available.  Imaging: No results found.   PMFS History: Patient Active Problem List  Diagnosis Date Noted  . AKI (acute kidney injury) ([place]) 09/07/2019  . Acute kidney injury ([place]) 09/06/2019  . Bradycardia 09/06/2019  . Hyperglycemia 09/06/2019  . HLD (hyperlipidemia)   . Hypertensive nephropathy 05/16/2019  . Erosive gastritis 11/16/2017  . Fatty liver 11/16/2017  . Abdominal pain 12/11/2012  . Early satiety 12/11/2012  . Hiatal hernia   . Hx of cholecystectomy 02/05/2011  . Flu-like symptoms 02/05/2011  . CONSTIPATION 08/27/2008  . FATTY LIVER DISEASE 05/07/2008  . Nausea with vomiting 04/23/2008  . EPIGASTRIC PAIN 04/23/2008  . RENAL CALCULUS 04/22/2008  . Type 2 diabetes mellitus with stage 2 chronic kidney disease, with long-term current use of insulin ([place]) 05/19/2006  . Type 2 diabetes mellitus with hyperlipidemia ([place])  05/19/2006  . HYPERTENSION, BENIGN SYSTEMIC 05/19/2006  . GASTROESOPHAGEAL REFLUX, NO ESOPHAGITIS 05/19/2006  . AMENORRHEA 05/19/2006   Past Medical History:  Diagnosis Date  . DM (diabetes mellitus) ([place])   . Fatty liver disease, nonalcoholic   . Gastritis   . Gastroparesis   . GERD (gastroesophageal reflux disease)   . Hiatal hernia   . Hiatal hernia   . HLD (hyperlipidemia)   . HTN (hypertension)   . Renal calculus     Family History  Problem Relation Age of Onset  . Diabetes Mother   . Diabetes Maternal Grandmother   . Stomach cancer Maternal Aunt        X 2 aunts  . Hypertension Father   . Colon cancer Neg Hx   . Esophageal cancer Neg Hx   . Rectal cancer Neg Hx     Past Surgical History:  Procedure Laterality Date  . CHOLECYSTECTOMY    . COLONOSCOPY    . TUBAL LIGATION    . UPPER GASTROINTESTINAL ENDOSCOPY     Social History   Occupational History  . Occupation: housekeeping  Tobacco Use  . Smoking status: Former Smoker    Packs/day: 0.25    Years: 1.00    Pack years: 0.25    Quit date: 09/10/1974    Years since quitting: 45.5  . Smokeless tobacco: Never Used  . Tobacco comment: she no longer smokes.   Vaping Use  . Vaping Use: Never used  Substance and Sexual Activity  . Alcohol use: No  . Drug use: No  . Sexual activity: Not Currently

## 2020-04-09 NOTE — Patient Instructions (Signed)
Visit Information  Goals Addressed            This Visit's Progress   . Pharmacy Care Plan       CARE PLAN ENTRY  Current Barriers:  . Chronic Disease Management support, education, and care coordination needs related to Hypertension and Diabetes   Hypertension . Pharmacist Clinical Goal(s): o Over the next 180 days, patient will work with PharmD and providers to maintain BP goal <130/80 . Current regimen:  o Amlodipine 10mg  daily o Edarbi 80mg  daily o Hydrochlorothiazide 12.5mg  daily . Interventions: o Provided dietary and exercise recommendations o Mailed diet recommendations . Patient self care activities - Over the next 45 days, patient will: o Check BP daily and if symptomatic, document, and provide at future appointments o Ensure daily salt intake < 2300 mg/day o Continue exercise regimen (at least 30 minutes of exercise 5 times weekly or 150 minutes per week) o Focus on healthy, balanced diet, limiting salt intake  Hyperlipidemia . Pharmacist Clinical Goal(s): o Over the next 90 days, patient will work with PharmD and providers to maintain BP goal <130/80   Diabetes . Pharmacist Clinical Goal(s): o Over the next 45 days, patient will work with PharmD and providers to achieve A1c goal <7% . Current regimen:  o Toujeo Solostar 50 units daily o Ozempic 0.25mg  weekly o Humalog per sliding scale . Interventions: o Discussed diet recommendations and meal planning in depth o Provided patient with carbohydrate counting and meal planning books at recent office visit o Discussed recent Hemoglobin A1c increase . Patient self care activities - Over the next 45 days, patient will: o Check blood sugar before meals, document, and provide at future appointments o Contact provider with any episodes of hypoglycemia o Continue exercise regimen (at least 30 minutes of exercise 5 times weekly or 150 minutes per week) o Focus on a healthy, well-balanced diet limiting fried foods  and sugary foods  Medication management . Pharmacist Clinical Goal(s): o Over the next 90 days, patient will work with PharmD and providers to maintain optimal medication adherence . Current pharmacy: [facility] . Interventions o Comprehensive medication review performed. o Utilize [facility] for medication synchronization, packaging and delivery . Patient self care activities - Over the next 45 days, patient will: o Focus on medication adherence by medication synchronization and using adherence packaging o Take medications as prescribed o Report any questions or concerns to PharmD and/or provider(s)  Please see past updates related to this goal by clicking on the "Past Updates" button in the selected goal         The patient verbalized understanding of instructions, educational materials, and care plan provided today and declined offer to receive copy of patient instructions, educational materials, and care plan.   The pharmacy team will reach out to the patient again over the next 30 days.   Mayford Knife, [facility]

## 2020-04-14 ENCOUNTER — Encounter: Payer: Self-pay | Admitting: Physical Therapy

## 2020-04-14 ENCOUNTER — Ambulatory Visit (INDEPENDENT_AMBULATORY_CARE_PROVIDER_SITE_OTHER): Payer: Medicare Other | Admitting: Physical Therapy

## 2020-04-14 ENCOUNTER — Other Ambulatory Visit: Payer: Self-pay

## 2020-04-14 ENCOUNTER — Telehealth: Payer: Self-pay

## 2020-04-14 DIAGNOSIS — G8929 Other chronic pain: Secondary | ICD-10-CM

## 2020-04-14 DIAGNOSIS — M25562 Pain in left knee: Secondary | ICD-10-CM | POA: Diagnosis not present

## 2020-04-14 DIAGNOSIS — R2689 Other abnormalities of gait and mobility: Secondary | ICD-10-CM

## 2020-04-14 DIAGNOSIS — M25662 Stiffness of left knee, not elsewhere classified: Secondary | ICD-10-CM | POA: Diagnosis not present

## 2020-04-14 DIAGNOSIS — R600 Localized edema: Secondary | ICD-10-CM

## 2020-04-14 DIAGNOSIS — M6281 Muscle weakness (generalized): Secondary | ICD-10-CM | POA: Diagnosis not present

## 2020-04-14 NOTE — Patient Instructions (Signed)
Access Code: [facility] URL: [email or web address] Date: 04/14/2020 Prepared by: Elsie Ra  Exercises Supine Quadriceps Stretch with Strap on Table - 2 x daily - 7 x weekly - 1 sets - 3 reps - 30 hold Supine Hamstring Stretch with Strap - 2 x daily - 7 x weekly - 1 sets - 3 reps - 30 hold Seated Knee Flexion Stretch - 2 x daily - 7 x weekly - 10 reps - 5-10 sec hold Seated Long Arc Quad - 2 x daily - 7 x weekly - 2 sets - 10 reps Supine Bridge - 2 x daily - 7 x weekly - 2 sets - 10 reps - 5 hold Mini Squat with Counter Support - 2 x daily - 7 x weekly - 2 sets - 10 reps Long Sitting 4 Way Patellar Glide - 2 x daily - 7 x weekly - 3 sets - 10 reps

## 2020-04-14 NOTE — Chronic Care Management (AMB) (Signed)
Chronic Care Management Pharmacy Assistant   Name: Leslie Davis  MRN: [id number] DOB: Mar 30, 1955  Reason for Encounter: Medication Review    PCP : [name], MD  Allergies:   Allergies  Allergen Reactions   Fiasp [Insulin Aspart (W-Niacinamide)] Hives    Medications: Outpatient Encounter Medications as of 04/14/2020  Medication Sig   amLODipine (NORVASC) 10 MG tablet Take 1 tablet (10 mg total) by mouth daily.   aspirin EC 81 MG tablet Take 1 tablet (81 mg total) by mouth daily.   Azilsartan Medoxomil (EDARBI) 80 MG TABS TAKE 1 TABLET EACH DAY.   benzonatate (TESSALON PERLES) 100 MG capsule Take 1 capsule (100 mg total) by mouth every 6 (six) hours as needed for cough.   Blood Glucose Monitoring Suppl (ONETOUCH VERIO) w/Device KIT Use as directed to check blood sugars 2 times per day dx: e11.65   cetirizine (ZYRTEC) 10 MG tablet Take 1 tablet (10 mg total) by mouth daily.   Cholecalciferol (VITAMIN D3) 1.25 MG (50000 UT) CAPS Take 1 capsule by mouth on Tuesday and Friday   Continuous Blood Gluc Receiver (FREESTYLE LIBRE 14 DAY READER) DEVI by Does not apply route.   Continuous Blood Gluc Sensor (FREESTYLE LIBRE 14 DAY SENSOR) MISC Use as directed to check blood sugars 4 times per day dx: e11.65   ferrous gluconate (FERGON) 324 MG tablet TAKE 1 TABLET BY MOUTH TWICE DAILY WITH A MEAL.   glucose blood (ONETOUCH VERIO) test strip Use as instructed to check blood sugars 2 times per day dx:e11.65   insulin glargine, 1 Unit Dial, (TOUJEO SOLOSTAR) 300 UNIT/ML Solostar Pen Inject 50 units subcutaneous at bedtime   insulin lispro (HUMALOG KWIKPEN) 100 UNIT/ML KwikPen Sliding scale   Insulin Pen Needle 32G X 6 MM MISC Use with pen injectors daily dx code: e11.9   Magnesium 400 MG TABS One tab po qpm   OneTouch Delica Lancets 62H MISC Use as directed to check blood sugars 2 times per day dx:e11.65   pantoprazole (PROTONIX) 40 MG tablet Take one tablet by mouth  3 days a week   rosuvastatin (CRESTOR) 20 MG tablet Take 1 tablet (20 mg total) by mouth daily.   Semaglutide,0.25 or 0.5MG /DOS, (OZEMPIC, 0.25 OR 0.5 MG/DOSE,) 2 MG/1.5ML SOPN Inject 0.25 mg into the skin once a week.   temazepam (RESTORIL) 30 MG capsule One capsules po qhs prn   traMADol (ULTRAM) 50 MG tablet Take 1 tablet (50 mg total) by mouth every 12 (twelve) hours as needed.   No facility-administered encounter medications on file as of 04/14/2020.    Current Diagnosis: Patient Active Problem List   Diagnosis Date Noted   AKI (acute kidney injury) ([place]) 09/07/2019   Acute kidney injury ([place]) 09/06/2019   Bradycardia 09/06/2019   Hyperglycemia 09/06/2019   HLD (hyperlipidemia)    Hypertensive nephropathy 05/16/2019   Erosive gastritis 11/16/2017   Fatty liver 11/16/2017   Abdominal pain 12/11/2012   Early satiety 12/11/2012   Hiatal hernia    Hx of cholecystectomy 02/05/2011   Flu-like symptoms 02/05/2011   CONSTIPATION 08/27/2008   FATTY LIVER DISEASE 05/07/2008   Nausea with vomiting 04/23/2008   EPIGASTRIC PAIN 04/23/2008   RENAL CALCULUS 04/22/2008   Type 2 diabetes mellitus with stage 2 chronic kidney disease, with long-term current use of insulin (Leslie Davis) 05/19/2006   Type 2 diabetes mellitus with hyperlipidemia (Leslie Davis) 05/19/2006   HYPERTENSION, BENIGN SYSTEMIC 05/19/2006   GASTROESOPHAGEAL REFLUX, NO ESOPHAGITIS 05/19/2006   AMENORRHEA 05/19/2006  Follow-Up:  Pharmacist Review -Reviewed chart and adherence measures. Per Insurance data medication adherence for cholesterol is 90-99% med compliance. Medication adherence for hypertension is 80-89 % med compliance.    Leslie Davis,CPP Notified  Leslie Davis, [facility] Clinical Pharmacist Assistant [phone]

## 2020-04-14 NOTE — Therapy (Signed)
[place], [place], [address] Phone: [phone]   Fax:  [phone]  Physical Therapy Evaluation  Patient Details  Name: Leslie Davis MRN: [id number] Date of Birth: [birth year] Referring Provider (PT): Erlinda Hong, MD   Encounter Date: 04/14/2020   PT End of Session - 04/14/20 1029    Visit Number 1    Number of Visits 16    Date for PT Re-Evaluation 06/09/20    Authorization Type U[employer] MCR    Progress Note Due on Visit 10    PT Start Time 0930    PT Stop Time 1012    PT Time Calculation (min) 42 min    Behavior During Therapy [facility] for tasks assessed/performed           Past Medical History:  Diagnosis Date  . DM (diabetes mellitus) ([place])   . Fatty liver disease, nonalcoholic   . Gastritis   . Gastroparesis   . GERD (gastroesophageal reflux disease)   . Hiatal hernia   . Hiatal hernia   . HLD (hyperlipidemia)   . HTN (hypertension)   . Renal calculus     Past Surgical History:  Procedure Laterality Date  . CHOLECYSTECTOMY    . COLONOSCOPY    . TUBAL LIGATION    . UPPER GASTROINTESTINAL ENDOSCOPY      There were no vitals filed for this visit.    Subjective Assessment - 04/14/20 0936    Subjective She relays she had Rt knee surgery about a year ago for what she thinks was a meniscal repair. She says her knee pain never really improved after surgery and is still bothers her. She saw Dr. Erlinda Hong and had imaging done and was referred to PT.    Pertinent History PMH" Rt knee menisectomy repair >1 year ago per pt    Limitations Standing;Walking;House hold activities;Other (comment)    How long can you stand comfortably? 20 min    How long can you walk comfortably? 20 min    Diagnostic tests Knee XR 02/06/20: Right knee shows mild to moderate degenerative changes to the  Medial and patellofemoral compartments, Lt knee shows moderated medial Patellafemoral OA"    Patient Stated Goals make my knee better, try to avoid  another surgery,    Currently in Pain? Yes    Pain Score 7     Pain Location Knee    Pain Orientation Left    Pain Descriptors / Indicators Aching    Pain Type Chronic pain    Pain Radiating Towards denies N/T    Pain Onset More than a month ago    Pain Frequency Constant    Aggravating Factors  standing, stairs, walking, bending or extending all the way, bike    Pain Relieving Factors rest, ice    Multiple Pain Sites No              [facility] PT Assessment - 04/14/20 0001      Assessment   Medical Diagnosis Chronic Lt knee pain/OA    Referring Provider (PT) Erlinda Hong, MD    Onset Date/Surgical Date --   Chronic pain > 1 year, had meniscal repair one year ago   Next MD Visit PRN    Prior Therapy none      Precautions   Precautions None      Restrictions   Weight Bearing Restrictions No      Balance Screen   Has the patient fallen in the past 6 months Yes  How many times? 1    Has the patient had a decrease in activity level because of a fear of falling?  No    Is the patient reluctant to leave their home because of a fear of falling?  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    [employer] gym      Cognition   Overall Cognitive Status Within Functional Limits for tasks assessed      Observation/Other Assessments   Focus on Therapeutic Outcomes (FOTO)  64% funcitonal intake, goal 70%      Functional Tests   Functional tests Other      Other:   Other/ Comments tandem stance 1 sec with left foot behind, 10 sec with Rt foot behind      ROM / Strength   AROM / PROM / Strength AROM;PROM;Strength      AROM   AROM Assessment Site Knee    Right/Left Knee Right;Left    Right Knee Extension 0     Right Knee Flexion 125    Left Knee Extension 0    Left Knee Flexion 119      Strength   Overall Strength Comments tested in sitting    Strength Assessment Site Knee;Hip    Right/Left Hip Right;Left    Right Hip Flexion 5/5    Right Hip ABduction 4/5    Left Hip Flexion 4/5    Left Hip ABduction 4-/5    Right/Left Knee Right;Left    Right Knee Flexion 5/5    Right Knee Extension 4+/5    Left Knee Flexion 4/5    Left Knee Extension 4-/5      Flexibility   Soft Tissue Assessment /Muscle Length --   very tight H.S and quads Lt is more tight than Rt     Palpation   Patella mobility Decreased patella mobility in Lt, WFL on Rt                      Objective measurements completed on examination: See above findings.       [place] Adult PT Treatment/Exercise - 04/14/20 0001      Modalities   Modalities Vasopneumatic      Vasopneumatic   Number Minutes Vasopneumatic  10 minutes    Vasopnuematic Location  Knee    Vasopneumatic Pressure Medium    Vasopneumatic Temperature  34                  PT Education - 04/14/20 1029    Education Details HEP, POC, exam findings    Person(s) Educated Patient    Methods Explanation;Demonstration;Verbal cues;Handout    Comprehension Verbalized understanding;Need further instruction            PT Short Term Goals - 04/14/20 1051      PT SHORT TERM GOAL #1   Title Pt will be I and compliant with HEP.    Time 4    Period Weeks    Status New      PT SHORT TERM GOAL #2   Title Pt will improve FOTO to 66%    Time 4  Period Weeks    Status New             PT Long Term Goals - 04/14/20 1053      PT LONG TERM GOAL #1   Title Pt will improve FOTO to 70%    Time 8    Period Weeks    Status New    Target Date 06/09/20      PT LONG TERM GOAL #2   Title Pt will improve Rt knee flexion AROM to 125 deg and improve patella hypomobility from moderate to mild.    Time 8    Period Weeks    Status New       PT LONG TERM GOAL #3   Title Pt will improve Lt hip/knee strength to overall 4+ to improve function.    Time 8    Period Weeks    Status New      PT LONG TERM GOAL #4   Title Pt will report less than 3-4/10 pain with standing/walking at least 30 minutes and negotiating her 3 steps to enter her house.                  Plan - 04/14/20 1032    Clinical Impression Statement Pt presents with chronic Lt knee pain/OA. She does report previous meniscal repair in this knee > one year ago. She will benefit from skilled PT to address her functional deficits in Lt knee ROM, hip/knee strength, tighteness in H.S, quads, patella hypomobility, and to improve standing activity tolerance and decrease overall pain.    Personal Factors and Comorbidities Time since onset of injury/illness/exacerbation    Examination-Activity Limitations Bend;Stairs;Stand;Lift;Squat;Locomotion Level    Examination-Participation Restrictions Community Activity;Driving;Occupation;Cleaning    Stability/Clinical Decision Making Stable/Uncomplicated    Clinical Decision Making Low    Rehab Potential Good    PT Frequency 2x / week   1-2   PT Duration 8 weeks    PT Treatment/Interventions ADLs/Self Care Home Management;Aquatic Therapy;Cryotherapy;Electrical Stimulation;Iontophoresis 4mg /ml Dexamethasone;Moist Heat;Ultrasound;Stair training;Therapeutic activities;Therapeutic exercise;Balance training;Neuromuscular re-education;Manual techniques;Passive range of motion;Dry needling;Joint Manipulations;Vasopneumatic Device;Taping    PT Next Visit Plan review and update HEP PRN, needs Lt knee ROM stretching and knee/hip strengthening. Work on balance and stairs/standing tolerance as able    PT Home Exercise Plan Access Code [facility]    Consulted and Agree with Plan of Care Patient           Patient will benefit from skilled therapeutic intervention in order to improve the following deficits and impairments:  Decreased activity  tolerance,Decreased balance,Decreased strength,Decreased endurance,Decreased range of motion,Increased edema,Hypomobility,Difficulty walking,Increased fascial restricitons,Increased muscle spasms,Impaired flexibility,Pain  Visit Diagnosis: Chronic pain of left knee  Muscle weakness (generalized)  Stiffness of left knee, not elsewhere classified  Localized edema  Other abnormalities of gait and mobility     Problem List Patient Active Problem List   Diagnosis Date Noted  . AKI (acute kidney injury) ([place]) 09/07/2019  . Acute kidney injury ([place]) 09/06/2019  . Bradycardia 09/06/2019  . Hyperglycemia 09/06/2019  . HLD (hyperlipidemia)   . Hypertensive nephropathy 05/16/2019  . Erosive gastritis 11/16/2017  . Fatty liver 11/16/2017  . Abdominal pain 12/11/2012  . Early satiety 12/11/2012  . Hiatal hernia   . Hx of cholecystectomy 02/05/2011  . Flu-like symptoms 02/05/2011  . CONSTIPATION 08/27/2008  . FATTY LIVER DISEASE 05/07/2008  . Nausea with vomiting 04/23/2008  . EPIGASTRIC PAIN 04/23/2008  . RENAL CALCULUS 04/22/2008  . Type 2 diabetes mellitus with stage 2 chronic  kidney disease, with long-term current use of insulin ([place]) 05/19/2006  . Type 2 diabetes mellitus with hyperlipidemia ([place]) 05/19/2006  . HYPERTENSION, BENIGN SYSTEMIC 05/19/2006  . GASTROESOPHAGEAL REFLUX, NO ESOPHAGITIS 05/19/2006  . AMENORRHEA 05/19/2006    Silvestre Mesi 04/14/2020, 11:00 AM  [facility] [address] [place], [place], [address] Phone: [phone]   Fax:  [phone]  Name: Leslie Davis MRN: [id number] Date of Birth: 09-25-1955

## 2020-04-18 ENCOUNTER — Ambulatory Visit (INDEPENDENT_AMBULATORY_CARE_PROVIDER_SITE_OTHER): Payer: Medicare Other | Admitting: Physical Therapy

## 2020-04-18 ENCOUNTER — Other Ambulatory Visit: Payer: Self-pay

## 2020-04-18 DIAGNOSIS — R600 Localized edema: Secondary | ICD-10-CM

## 2020-04-18 DIAGNOSIS — M25662 Stiffness of left knee, not elsewhere classified: Secondary | ICD-10-CM | POA: Diagnosis not present

## 2020-04-18 DIAGNOSIS — R2689 Other abnormalities of gait and mobility: Secondary | ICD-10-CM | POA: Diagnosis not present

## 2020-04-18 DIAGNOSIS — M25562 Pain in left knee: Secondary | ICD-10-CM | POA: Diagnosis not present

## 2020-04-18 DIAGNOSIS — G8929 Other chronic pain: Secondary | ICD-10-CM | POA: Diagnosis not present

## 2020-04-18 DIAGNOSIS — M6281 Muscle weakness (generalized): Secondary | ICD-10-CM

## 2020-04-18 NOTE — Therapy (Signed)
[place], [place], [address] Phone: [phone]   Fax:  [phone]  Physical Therapy Treatment  Patient Details  Name: Leslie Davis MRN: [id number] Date of Birth: [birth year] Referring Provider (PT): Erlinda Hong, MD   Encounter Date: 04/18/2020   PT End of Session - 04/18/20 1530    Visit Number 2    Number of Visits 16    Date for PT Re-Evaluation 06/09/20    Authorization Type UHC MCR    Progress Note Due on Visit 10    PT Start Time 1352    PT Stop Time 1436    PT Time Calculation (min) 44 min    Activity Tolerance Patient tolerated treatment well    Behavior During Therapy [facility] for tasks assessed/performed           Past Medical History:  Diagnosis Date  . DM (diabetes mellitus) ([place])   . Fatty liver disease, nonalcoholic   . Gastritis   . Gastroparesis   . GERD (gastroesophageal reflux disease)   . Hiatal hernia   . Hiatal hernia   . HLD (hyperlipidemia)   . HTN (hypertension)   . Renal calculus     Past Surgical History:  Procedure Laterality Date  . CHOLECYSTECTOMY    . COLONOSCOPY    . TUBAL LIGATION    . UPPER GASTROINTESTINAL ENDOSCOPY      There were no vitals filed for this visit.   Subjective Assessment - 04/18/20 1527    Subjective she relays 7/10 knee pain overall    Pertinent History PMH" Rt knee menisectomy repair >1 year ago per pt    Limitations Standing;Walking;House hold activities;Other (comment)    How long can you stand comfortably? 20 min    How long can you walk comfortably? 20 min    Diagnostic tests Knee XR 02/06/20: Right knee shows mild to moderate degenerative changes to the  Medial and patellofemoral compartments, Lt knee shows moderated medial Patellafemoral OA"    Patient Stated Goals make my knee better, try to avoid another surgery,    Pain Onset More than a month ago             [facility] Adult PT Treatment/Exercise - 04/18/20 0001      Exercises   Exercises  Knee/Hip      Knee/Hip Exercises: Stretches   Active Hamstring Stretch Left;3 reps;30 seconds    Active Hamstring Stretch Limitations supine with strap    Quad Stretch Left;3 reps;30 seconds    Quad Stretch Limitations supine with strap    Gastroc Stretch Both;3 reps;30 seconds    Gastroc Stretch Limitations slantboard      Knee/Hip Exercises: Aerobic   Nustep L4 X 8 min      Knee/Hip Exercises: Seated   Long Arc Quad Both;15 reps      Knee/Hip Exercises: Supine   Short Arc Quad Sets Both;15 reps    Short Arc Quad Sets Limitations holding 5 sec    Bridges 10 reps    Straight Leg Raises Left;15 reps    Other Supine Knee/Hip Exercises unilat clams green X 20 on ea side      Vasopneumatic   Number Minutes Vasopneumatic  10 minutes    Vasopnuematic Location  Knee    Vasopneumatic Pressure Medium    Vasopneumatic Temperature  34      Manual Therapy   Manual therapy comments patella mobs, Lt knee PROM and flexion/distraction mobs grade 1-2  PT Short Term Goals - 04/14/20 1051      PT SHORT TERM GOAL #1   Title Pt will be I and compliant with HEP.    Time 4    Period Weeks    Status New      PT SHORT TERM GOAL #2   Title Pt will improve FOTO to 66%    Time 4    Period Weeks    Status New             PT Long Term Goals - 04/14/20 1053      PT LONG TERM GOAL #1   Title Pt will improve FOTO to 70%    Time 8    Period Weeks    Status New    Target Date 06/09/20      PT LONG TERM GOAL #2   Title Pt will improve Rt knee flexion AROM to 125 deg and improve patella hypomobility from moderate to mild.    Time 8    Period Weeks    Status New      PT LONG TERM GOAL #3   Title Pt will improve Lt hip/knee strength to overall 4+ to improve function.    Time 8    Period Weeks    Status New      PT LONG TERM GOAL #4   Title Pt will report less than 3-4/10 pain with standing/walking at least 30 minutes and negotiating her 3 steps to enter  her house.                 Plan - 04/18/20 1556    Clinical Impression Statement Session focused on knee strengthening and stretching to her tolerance. Exercises performed in gentle ROM/efforts as she did have high pain level today. Used vaso for edema and inflammation. Continue POC    Personal Factors and Comorbidities Time since onset of injury/illness/exacerbation    Examination-Activity Limitations Bend;Stairs;Stand;Lift;Squat;Locomotion Level    Examination-Participation Restrictions Community Activity;Driving;Occupation;Cleaning    Stability/Clinical Decision Making Stable/Uncomplicated    Rehab Potential Good    PT Frequency 2x / week   1-2   PT Duration 8 weeks    PT Treatment/Interventions ADLs/Self Care Home Management;Aquatic Therapy;Cryotherapy;Electrical Stimulation;Iontophoresis 4mg /ml Dexamethasone;Moist Heat;Ultrasound;Stair training;Therapeutic activities;Therapeutic exercise;Balance training;Neuromuscular re-education;Manual techniques;Passive range of motion;Dry needling;Joint Manipulations;Vasopneumatic Device;Taping    PT Next Visit Plan review and update HEP PRN, needs Lt knee ROM stretching and knee/hip strengthening. Work on balance and stairs/standing tolerance as able    PT Home Exercise Plan Access Code [facility]    Consulted and Agree with Plan of Care Patient           Patient will benefit from skilled therapeutic intervention in order to improve the following deficits and impairments:  Decreased activity tolerance,Decreased balance,Decreased strength,Decreased endurance,Decreased range of motion,Increased edema,Hypomobility,Difficulty walking,Increased fascial restricitons,Increased muscle spasms,Impaired flexibility,Pain  Visit Diagnosis: Chronic pain of left knee  Muscle weakness (generalized)  Stiffness of left knee, not elsewhere classified  Localized edema  Other abnormalities of gait and mobility     Problem List Patient Active Problem  List   Diagnosis Date Noted  . AKI (acute kidney injury) ([place]) 09/07/2019  . Acute kidney injury ([place]) 09/06/2019  . Bradycardia 09/06/2019  . Hyperglycemia 09/06/2019  . HLD (hyperlipidemia)   . Hypertensive nephropathy 05/16/2019  . Erosive gastritis 11/16/2017  . Fatty liver 11/16/2017  . Abdominal pain 12/11/2012  . Early satiety 12/11/2012  . Hiatal hernia   . Hx of cholecystectomy 02/05/2011  .  Flu-like symptoms 02/05/2011  . CONSTIPATION 08/27/2008  . FATTY LIVER DISEASE 05/07/2008  . Nausea with vomiting 04/23/2008  . EPIGASTRIC PAIN 04/23/2008  . RENAL CALCULUS 04/22/2008  . Type 2 diabetes mellitus with stage 2 chronic kidney disease, with long-term current use of insulin ([place]) 05/19/2006  . Type 2 diabetes mellitus with hyperlipidemia ([place]) 05/19/2006  . HYPERTENSION, BENIGN SYSTEMIC 05/19/2006  . GASTROESOPHAGEAL REFLUX, NO ESOPHAGITIS 05/19/2006  . AMENORRHEA 05/19/2006    Debbe Odea, PT,DPT 04/18/2020, 3:57 PM  [facility] Physical Therapy [address] [place], [place], [address] Phone: [phone]   Fax:  [phone]  Name: Leslie Davis MRN: [id number] Date of Birth: [birth year]

## 2020-04-21 ENCOUNTER — Telehealth: Payer: Medicare Other

## 2020-04-21 ENCOUNTER — Ambulatory Visit: Payer: Self-pay

## 2020-04-21 DIAGNOSIS — I129 Hypertensive chronic kidney disease with stage 1 through stage 4 chronic kidney disease, or unspecified chronic kidney disease: Secondary | ICD-10-CM

## 2020-04-21 DIAGNOSIS — N182 Chronic kidney disease, stage 2 (mild): Secondary | ICD-10-CM

## 2020-04-21 DIAGNOSIS — E1122 Type 2 diabetes mellitus with diabetic chronic kidney disease: Secondary | ICD-10-CM

## 2020-04-21 NOTE — Telephone Encounter (Signed)
This encounter was created in error - please disregard.

## 2020-04-22 ENCOUNTER — Encounter: Payer: Self-pay | Admitting: Orthopaedic Surgery

## 2020-04-22 ENCOUNTER — Ambulatory Visit (INDEPENDENT_AMBULATORY_CARE_PROVIDER_SITE_OTHER): Payer: Medicare Other | Admitting: Orthopaedic Surgery

## 2020-04-22 ENCOUNTER — Other Ambulatory Visit: Payer: Self-pay

## 2020-04-22 ENCOUNTER — Ambulatory Visit (INDEPENDENT_AMBULATORY_CARE_PROVIDER_SITE_OTHER): Payer: Medicare Other | Admitting: Physical Therapy

## 2020-04-22 DIAGNOSIS — M1712 Unilateral primary osteoarthritis, left knee: Secondary | ICD-10-CM

## 2020-04-22 DIAGNOSIS — M25562 Pain in left knee: Secondary | ICD-10-CM

## 2020-04-22 DIAGNOSIS — R634 Abnormal weight loss: Secondary | ICD-10-CM

## 2020-04-22 DIAGNOSIS — M6281 Muscle weakness (generalized): Secondary | ICD-10-CM | POA: Diagnosis not present

## 2020-04-22 DIAGNOSIS — G8929 Other chronic pain: Secondary | ICD-10-CM

## 2020-04-22 DIAGNOSIS — R2689 Other abnormalities of gait and mobility: Secondary | ICD-10-CM

## 2020-04-22 DIAGNOSIS — R600 Localized edema: Secondary | ICD-10-CM | POA: Diagnosis not present

## 2020-04-22 DIAGNOSIS — M25662 Stiffness of left knee, not elsewhere classified: Secondary | ICD-10-CM

## 2020-04-22 NOTE — Therapy (Signed)
[place], [place], [address] Phone: [phone]   Fax:  [phone]  Physical Therapy Treatment  Patient Details  Name: Leslie Davis MRN: [id number] Date of Birth: 1955/10/16 Referring Provider (PT): Erlinda Hong, MD   Encounter Date: 04/22/2020   PT End of Session - 04/22/20 1053    Visit Number 3    Number of Visits 16    Date for PT Re-Evaluation 06/09/20    Authorization Type UHC MCR    Progress Note Due on Visit 10    PT Start Time 1013    PT Stop Time 1102    PT Time Calculation (min) 49 min    Activity Tolerance Patient tolerated treatment well    Behavior During Therapy [facility] for tasks assessed/performed           Past Medical History:  Diagnosis Date  . DM (diabetes mellitus) ([place])   . Fatty liver disease, nonalcoholic   . Gastritis   . Gastroparesis   . GERD (gastroesophageal reflux disease)   . Hiatal hernia   . Hiatal hernia   . HLD (hyperlipidemia)   . HTN (hypertension)   . Renal calculus     Past Surgical History:  Procedure Laterality Date  . CHOLECYSTECTOMY    . COLONOSCOPY    . TUBAL LIGATION    . UPPER GASTROINTESTINAL ENDOSCOPY      There were no vitals filed for this visit.   Subjective Assessment - 04/22/20 1018    Subjective patinet continues to have 7/0 knee pain but states exercises are going well at home.    Pertinent History PMH" Rt knee menisectomy repair >1 year ago per pt    Limitations Standing;Walking;House hold activities;Other (comment)    How long can you stand comfortably? 20 min    How long can you walk comfortably? 20 min    Diagnostic tests Knee XR 02/06/20: Right knee shows mild to moderate degenerative changes to the  Medial and patellofemoral compartments, Lt knee shows moderated medial Patellafemoral OA"    Patient Stated Goals make my knee better, try to avoid another surgery,    Currently in Pain? Yes    Pain Score 7     Pain Location Knee    Pain  Orientation Left    Pain Onset More than a month ago              [facility] PT Assessment - 04/22/20 0001      PROM   PROM Assessment Site Knee    Right/Left Knee Left    Left Knee Flexion 127   pulled with strap supine            [facility] Adult PT Treatment/Exercise - 04/22/20 0001      Knee/Hip Exercises: Stretches   Active Hamstring Stretch Left;3 reps;30 seconds    Gastroc Stretch Both;3 reps;30 seconds    Gastroc Stretch Limitations slantboard      Knee/Hip Exercises: Aerobic   Nustep L4 X 8 min   seat 8     Knee/Hip Exercises: Standing   Other Standing Knee Exercises TKE with greeen Tband 2x10  Step ups 4 inch but some pain with this, may consider 2 inch in future if needed.     Knee/Hip Exercises: Seated   Long Arc Quad Left;2 sets;10 reps   3 sec hold at top     Knee/Hip Exercises: Supine   Short Arc [employer] Left;15 reps    Short Arc [employer] Limitations  3 sec hold    Bridges with [employer] Both;1 set;10 reps    Straight Leg Raises 15 reps;Left    Other Supine Knee/Hip Exercises heel slides  10x      Modalities   Modalities Vasopneumatic      Vasopneumatic   Number Minutes Vasopneumatic  10 minutes    Vasopnuematic Location  Knee    Vasopneumatic Pressure Medium    Vasopneumatic Temperature  34      Manual Therapy   Manual therapy comments 5 min patellar mobs g2-3 for flexion                    PT Short Term Goals - 04/14/20 1051      PT SHORT TERM GOAL #1   Title Pt will be I and compliant with HEP.    Time 4    Period Weeks    Status New      PT SHORT TERM GOAL #2   Title Pt will improve FOTO to 66%    Time 4    Period Weeks    Status New             PT Long Term Goals - 04/14/20 1053      PT LONG TERM GOAL #1   Title Pt will improve FOTO to 70%    Time 8    Period Weeks    Status New    Target Date 06/09/20      PT LONG TERM GOAL #2   Title Pt will improve Rt knee flexion AROM to 125 deg and improve patella  hypomobility from moderate to mild.    Time 8    Period Weeks    Status New      PT LONG TERM GOAL #3   Title Pt will improve Lt hip/knee strength to overall 4+ to improve function.    Time 8    Period Weeks    Status New      PT LONG TERM GOAL #4   Title Pt will report less than 3-4/10 pain with standing/walking at least 30 minutes and negotiating her 3 steps to enter her house.                 Plan - 04/22/20 1055    Clinical Impression Statement Patient had higher pain levels again today but was able to tolerate BW strengthening. Attempted to try 4'' step up with UE but was limited to her pain and strength to maintin proper form. Will continue strenghtening to work toward steps ups. ROM increased passively to be [facility]. Patient will benefit from increased mobility and functional strength.    Personal Factors and Comorbidities Time since onset of injury/illness/exacerbation    Examination-Activity Limitations Bend;Stairs;Stand;Lift;Squat;Locomotion Level    Examination-Participation Restrictions Community Activity;Driving;Occupation;Cleaning    Stability/Clinical Decision Making Stable/Uncomplicated    Rehab Potential Good    PT Frequency 2x / week   1-2   PT Duration 8 weeks    PT Treatment/Interventions ADLs/Self Care Home Management;Aquatic Therapy;Cryotherapy;Electrical Stimulation;Iontophoresis 4mg /ml Dexamethasone;Moist Heat;Ultrasound;Stair training;Therapeutic activities;Therapeutic exercise;Balance training;Neuromuscular re-education;Manual techniques;Passive range of motion;Dry needling;Joint Manipulations;Vasopneumatic Device;Taping    PT Next Visit Plan review and update HEP PRN, needs Lt knee ROM stretching and knee/hip strengthening. Work on balance and stairs/standing tolerance as able    PT Home Exercise Plan Access Code [facility]    Consulted and Agree with Plan of Care Patient           Patient will benefit from  skilled therapeutic intervention in order to  improve the following deficits and impairments:  Decreased activity tolerance,Decreased balance,Decreased strength,Decreased endurance,Decreased range of motion,Increased edema,Hypomobility,Difficulty walking,Increased fascial restricitons,Increased muscle spasms,Impaired flexibility,Pain  Visit Diagnosis: Chronic pain of left knee  Muscle weakness (generalized)  Stiffness of left knee, not elsewhere classified  Localized edema  Other abnormalities of gait and mobility     Problem List Patient Active Problem List   Diagnosis Date Noted  . AKI (acute kidney injury) ([place]) 09/07/2019  . Acute kidney injury ([place]) 09/06/2019  . Bradycardia 09/06/2019  . Hyperglycemia 09/06/2019  . HLD (hyperlipidemia)   . Hypertensive nephropathy 05/16/2019  . Erosive gastritis 11/16/2017  . Fatty liver 11/16/2017  . Abdominal pain 12/11/2012  . Early satiety 12/11/2012  . Hiatal hernia   . Hx of cholecystectomy 02/05/2011  . Flu-like symptoms 02/05/2011  . CONSTIPATION 08/27/2008  . FATTY LIVER DISEASE 05/07/2008  . Nausea with vomiting 04/23/2008  . EPIGASTRIC PAIN 04/23/2008  . RENAL CALCULUS 04/22/2008  . Type 2 diabetes mellitus with stage 2 chronic kidney disease, with long-term current use of insulin ([place]) 05/19/2006  . Type 2 diabetes mellitus with hyperlipidemia ([place]) 05/19/2006  . HYPERTENSION, BENIGN SYSTEMIC 05/19/2006  . GASTROESOPHAGEAL REFLUX, NO ESOPHAGITIS 05/19/2006  . AMENORRHEA 05/19/2006    Glenetta Hew, SPT 04/22/2020, 10:59 AM   During this treatment session, this physical therapist was present, participating in and directing the treatment.   This note has been reviewed and this clinician agrees with the information provided.  Elsie Ra, PT, DPT 04/22/20 1:07 PM   [facility] Physical Therapy [address] [place], [place], [address] Phone: [phone]   Fax:  [phone]  Name: Leslie Davis MRN: [id number] Date of Birth:  06-Aug-1955

## 2020-04-22 NOTE — Patient Instructions (Signed)
Goals Addressed    Other   .  Manage left knee pain related to Osteoarthritis   On track     Timeframe:  Long-Range Goal Priority:  High Start Date: 04/21/20                           Expected End Date: 07/19/20        Follow up date:  06/05/20  Over the next 90 days, patient will:      Continue to follow up with outpatient PT as directed Keep all MD follow up appointments as scheduled Take pain medication exactly as prescribed for left knee pain Continue to adhere to HEP as directed by PT              .  Track and Manage My Blood Pressure-Hypertension   On track     Timeframe:  Long-Range Goal Priority:  High Start Date: 04/21/20                            Expected End Date: 10/17/20                      Follow Up Date: 06/05/20   Over the next 180 days, patient will:   Self administers medications as prescribed Attends all scheduled provider appointments Calls provider office for new concerns, questions, or BP outside discussed parameters Checks BP and records as discussed Follows a low sodium diet/DASH diet   Why is this important?    You won't feel high blood pressure, but it can still hurt your blood vessels.   High blood pressure can cause heart or kidney problems. It can also cause a stroke.   Making lifestyle changes like losing a Mirranda Monrroy weight or eating less salt will help.   Checking your blood pressure at home and at different times of the day can help to control blood pressure.   If the doctor prescribes medicine remember to take it the way the doctor ordered.   Call the office if you cannot afford the medicine or if there are questions about it.     Notes:

## 2020-04-22 NOTE — Chronic Care Management (AMB) (Signed)
Chronic Care Management   CCM RN Visit Note  1/31//2022 Name: Leslie Davis MRN: [id number] DOB: 30-May-1955  Subjective: Leslie Davis is a 65 y.o. year old female who is a primary care patient of [name], MD. The care management team was consulted for assistance with disease management and care coordination needs.    Engaged with patient by telephone for follow up visit in response to provider referral for case management and/or care coordination services.   Consent to Services:  The patient was given information about Chronic Care Management services, agreed to services, and gave verbal consent prior to initiation of services.  Please see initial visit note for detailed documentation.   Patient agreed to services and verbal consent obtained.   Assessment: Review of patient past medical history, allergies, medications, health status, including review of consultants reports, laboratory and other test data, was performed as part of comprehensive evaluation and provision of chronic care management services.   SDOH (Social Determinants of Health) assessments and interventions performed:    CCM Care Plan  Allergies  Allergen Reactions  . Fiasp [Insulin Aspart (W-Niacinamide)] Hives    Outpatient Encounter Medications as of 04/21/2020  Medication Sig  . amLODipine (NORVASC) 10 MG tablet Take 1 tablet (10 mg total) by mouth daily.  Marland Kitchen aspirin EC 81 MG tablet Take 1 tablet (81 mg total) by mouth daily.  . Azilsartan Medoxomil (EDARBI) 80 MG TABS TAKE 1 TABLET EACH DAY.  . benzonatate (TESSALON PERLES) 100 MG capsule Take 1 capsule (100 mg total) by mouth every 6 (six) hours as needed for cough.  . Blood Glucose Monitoring Suppl (ONETOUCH VERIO) w/Device KIT Use as directed to check blood sugars 2 times per day dx: e11.65  . cetirizine (ZYRTEC) 10 MG tablet Take 1 tablet (10 mg total) by mouth daily.  . Cholecalciferol (VITAMIN D3) 1.25 MG (50000 UT) CAPS Take 1 capsule by  mouth on Tuesday and Friday  . Continuous Blood Gluc Receiver (FREESTYLE LIBRE 14 DAY READER) DEVI by Does not apply route.  . Continuous Blood Gluc Sensor (FREESTYLE LIBRE 14 DAY SENSOR) MISC Use as directed to check blood sugars 4 times per day dx: e11.65  . ferrous gluconate (FERGON) 324 MG tablet TAKE 1 TABLET BY MOUTH TWICE DAILY WITH A MEAL.  Marland Kitchen glucose blood (ONETOUCH VERIO) test strip Use as instructed to check blood sugars 2 times per day dx:e11.65  . insulin glargine, 1 Unit Dial, (TOUJEO SOLOSTAR) 300 UNIT/ML Solostar Pen Inject 50 units subcutaneous at bedtime  . insulin lispro (HUMALOG KWIKPEN) 100 UNIT/ML KwikPen Sliding scale  . Insulin Pen Needle 32G X 6 MM MISC Use with pen injectors daily dx code: e11.9  . Magnesium 400 MG TABS One tab po qpm  . OneTouch Delica Lancets 06C MISC Use as directed to check blood sugars 2 times per day dx:e11.65  . pantoprazole (PROTONIX) 40 MG tablet Take one tablet by mouth 3 days a week  . rosuvastatin (CRESTOR) 20 MG tablet Take 1 tablet (20 mg total) by mouth daily.  . Semaglutide,0.25 or 0.5MG /DOS, (OZEMPIC, 0.25 OR 0.5 MG/DOSE,) 2 MG/1.5ML SOPN Inject 0.25 mg into the skin once a week.  . temazepam (RESTORIL) 30 MG capsule One capsules po qhs prn  . traMADol (ULTRAM) 50 MG tablet Take 1 tablet (50 mg total) by mouth every 12 (twelve) hours as needed.   No facility-administered encounter medications on file as of 04/21/2020.    Patient Active Problem List   Diagnosis Date Noted  .  AKI (acute kidney injury) ([place]) 09/07/2019  . Acute kidney injury ([place]) 09/06/2019  . Bradycardia 09/06/2019  . Hyperglycemia 09/06/2019  . HLD (hyperlipidemia)   . Hypertensive nephropathy 05/16/2019  . Erosive gastritis 11/16/2017  . Fatty liver 11/16/2017  . Abdominal pain 12/11/2012  . Early satiety 12/11/2012  . Hiatal hernia   . Hx of cholecystectomy 02/05/2011  . Flu-like symptoms 02/05/2011  . CONSTIPATION 08/27/2008  . FATTY LIVER DISEASE  05/07/2008  . Nausea with vomiting 04/23/2008  . EPIGASTRIC PAIN 04/23/2008  . RENAL CALCULUS 04/22/2008  . Type 2 diabetes mellitus with stage 2 chronic kidney disease, with long-term current use of insulin ([place]) 05/19/2006  . Type 2 diabetes mellitus with hyperlipidemia ([place]) 05/19/2006  . HYPERTENSION, BENIGN SYSTEMIC 05/19/2006  . GASTROESOPHAGEAL REFLUX, NO ESOPHAGITIS 05/19/2006  . AMENORRHEA 05/19/2006    Conditions to be addressed/monitored:Continue to follow up with outpatient PT as directed  Care Plan : Hypertension (Adult)  Updates made by Lynne Logan, RN since 04/22/2020 12:00 AM    Problem: Hypertension (Hypertension)   Priority: High    Long-Range Goal: Hypertension Monitored   Start Date: 04/21/2020  Expected End Date: 10/17/2020  This Visit's Progress: On track  Priority: High  Note:   Objective:  . Last practice recorded BP readings:  BP Readings from Last 3 Encounters:  02/21/20 (!) 152/68  11/15/19 128/78  11/15/19 128/78 .   Marland Kitchen Most recent eGFR/CrCl: No results found for: EGFR  No components found for: CRCL Current Barriers:  Marland Kitchen Knowledge Deficits related to basic understanding of hypertension pathophysiology and self care management . Knowledge Deficits related to understanding of medications prescribed for management of hypertension Case Manager Clinical Goal(s):  Marland Kitchen Over the next 180 days, patient will demonstrate improved adherence to prescribed treatment plan for hypertension as evidenced by taking all medications as prescribed, monitoring and recording blood pressure as directed, adhering to low sodium/DASH diet Interventions:  . Collaboration with [name], MD regarding development and update of comprehensive plan of care as evidenced by provider attestation and co-signature . Inter-disciplinary care team collaboration (see longitudinal plan of care) . Evaluation of current treatment plan related to hypertension self management and patient's  adherence to plan as established by provider. . Provided education to patient re: stroke prevention, s/s of heart attack and stroke, DASH diet, complications of uncontrolled blood pressure . Reviewed medications with patient and discussed importance of compliance . Advised patient, providing education and rationale, to monitor blood pressure daily and record, calling PCP for findings outside established parameters.  . Reviewed scheduled/upcoming provider appointments including: PCP follow up with Dr. Baird Cancer, 06/03/20@ 8:30 am  . Discussed plans with patient for ongoing care management follow up and provided patient with direct contact information for care management team Patient Goals/Self-Care Activities . Over the next 180 days, patient will:  - Self administers medications as prescribed Attends all scheduled provider appointments Calls provider office for new concerns, questions, or BP outside discussed parameters Checks BP and records as discussed Follows a low sodium diet/DASH diet Follow Up Plan: Telephone follow up appointment with care management team member scheduled for: 06/05/20   Care Plan : Osteoarthritis (Adult)  Updates made by Lynne Logan, RN since 04/22/2020 12:00 AM    Problem: Mobility and Function (Osteoarthritis)   Priority: High    Goal: Maintain Mobility and Function   Start Date: 04/21/2020  Expected End Date: 07/18/2020  This Visit's Progress: On track  Priority: High  Note:   Current  Barriers:   Ineffective Self Health Maintenance  Currently UNABLE TO independently self manage needs related to chronic health conditions.   Knowledge Deficits related to short term plan for care coordination needs and long term plans for chronic disease management needs Case Manager Clinical Goal(s):  Marland Kitchen Collaboration with [name], MD regarding development and update of comprehensive plan of care as evidenced by provider attestation and co-signature . Inter-disciplinary  care team collaboration (see longitudinal plan of care)  Over the next 90 days, patient will work with care management team to address care coordination and chronic disease management needs related to Disease Management  Educational Needs  Care Coordination  Medication Management and Education  Psychosocial Support   Interventions:   Successful call completed with patient   Assessed for adherence to prescribed treatment plan by provider for Osteoarthritis  Acknowledge and validate impact of pain, loss of strength and potential disfigurement (hand osteoarthritis) on mental health and daily life, such as social isolation, anxiety, depression, impaired sexual relationship and  or injury from falls  Determined patient continues to attend outpatient PT for improved strength of left knee  Determined patient continues to follow up with Dr. Erlinda Hong for ongoing evaluation and treatment of Osteoarthritis   Determined patient will continue to use conservative therapy to help avoid surgical intervention  Assessed for barriers to participation in therapy or exercise, such as pain with activity, anticipated or imagined pain.   Discussed plans with patient for ongoing care management follow up and provided patient with direct contact information for care management team Patient Goals/Self Care Activities:  Over the next 90 days, patient will:  Continue to follow up with outpatient PT as directed Keep all MD follow up appointments as scheduled Take pain medication exactly as prescribed for left knee pain Continue to adhere to HEP as directed by PT   Follow Up Plan: Telephone follow up appointment with care management team member scheduled for: 06/05/20     Plan:Telephone follow up appointment with care management team member scheduled for:  06/05/20   Barb Merino, RN, BSN, CCM Care Management Coordinator [facility]  Direct Phone: [phone]

## 2020-04-22 NOTE — Progress Notes (Addendum)
Office Visit Note   Patient: Leslie Davis           Date of Birth: 10-Aug-1955           MRN: [id number] Visit Date: 04/22/2020              Requested by: [name], [address] [place],  [place] [address] PCP: [name], MD   Assessment & Plan: Visit Diagnoses:  1. Unilateral primary osteoarthritis, left knee     Plan: Impression is advanced degenerative joint disease left knee primarily to the medial compartment.  We did discuss treatment options to include prescription medial unloader DJD brace for which she would like to try.  We will also make another referral to the wellness clinic as she has not heard from them yet.  Follow-up with [place] as needed.  Patient will need a custom brace due to thigh to calf ratio  Follow-Up Instructions: Return if symptoms worsen or fail to improve.   Orders:  No orders of the defined types were placed in this encounter.  No orders of the defined types were placed in this encounter.     Procedures: No procedures performed   Clinical Data: No additional findings.   Subjective: Chief Complaint  Patient presents with  . Right Knee - Pain    HPI patient is a pleasant 65 year old female who comes in today with recurrent left knee pain.  History of degenerative joint disease primarily to the medial compartment.  She has tried cortisone and viscosupplementation injections in the past without significant relief of symptoms.  She has tried over-the-counter bracing without relief.  She notes that her pain is primarily with ambulation and going up and down stairs.  She denies any pain at rest.  She is inquiring about a prescription brace today.  Review of Systems as detailed in HPI.  All others reviewed and are negative.   Objective: Vital Signs: There were no vitals taken for this visit.  Physical Exam well-developed well-nourished female no acute distress.  Alert oriented x3.  Ortho Exam left knee exam shows  a trace effusion.  Range of motion 0 to 110 degrees.  Medial and lateral joint line tenderness.  Mild patellofemoral crepitus.  Mild varus instability.  She is neurovascular intact distally.  Specialty Comments:  No specialty comments available.  Imaging: No new imaging   PMFS History: Patient Active Problem List   Diagnosis Date Noted  . AKI (acute kidney injury) ([place]) 09/07/2019  . Acute kidney injury ([place]) 09/06/2019  . Bradycardia 09/06/2019  . Hyperglycemia 09/06/2019  . HLD (hyperlipidemia)   . Hypertensive nephropathy 05/16/2019  . Erosive gastritis 11/16/2017  . Fatty liver 11/16/2017  . Abdominal pain 12/11/2012  . Early satiety 12/11/2012  . Hiatal hernia   . Hx of cholecystectomy 02/05/2011  . Flu-like symptoms 02/05/2011  . CONSTIPATION 08/27/2008  . FATTY LIVER DISEASE 05/07/2008  . Nausea with vomiting 04/23/2008  . EPIGASTRIC PAIN 04/23/2008  . RENAL CALCULUS 04/22/2008  . Type 2 diabetes mellitus with stage 2 chronic kidney disease, with long-term current use of insulin ([place]) 05/19/2006  . Type 2 diabetes mellitus with hyperlipidemia ([place]) 05/19/2006  . HYPERTENSION, BENIGN SYSTEMIC 05/19/2006  . GASTROESOPHAGEAL REFLUX, NO ESOPHAGITIS 05/19/2006  . AMENORRHEA 05/19/2006   Past Medical History:  Diagnosis Date  . DM (diabetes mellitus) ([place])   . Fatty liver disease, nonalcoholic   . Gastritis   . Gastroparesis   . GERD (gastroesophageal reflux disease)   .  Hiatal hernia   . Hiatal hernia   . HLD (hyperlipidemia)   . HTN (hypertension)   . Renal calculus     Family History  Problem Relation Age of Onset  . Diabetes Mother   . Diabetes Maternal Grandmother   . Stomach cancer Maternal Aunt        X 2 aunts  . Hypertension Father   . Colon cancer Neg Hx   . Esophageal cancer Neg Hx   . Rectal cancer Neg Hx     Past Surgical History:  Procedure Laterality Date  . CHOLECYSTECTOMY    . COLONOSCOPY    . TUBAL LIGATION    . UPPER  GASTROINTESTINAL ENDOSCOPY     Social History   Occupational History  . Occupation: housekeeping  Tobacco Use  . Smoking status: Former Smoker    Packs/day: 0.25    Years: 1.00    Pack years: 0.25    Quit date: 09/10/1974    Years since quitting: 45.6  . Smokeless tobacco: Never Used  . Tobacco comment: she no longer smokes.   Vaping Use  . Vaping Use: Never used  Substance and Sexual Activity  . Alcohol use: No  . Drug use: No  . Sexual activity: Not Currently

## 2020-04-24 ENCOUNTER — Ambulatory Visit (INDEPENDENT_AMBULATORY_CARE_PROVIDER_SITE_OTHER): Payer: Medicare Other | Admitting: Physical Therapy

## 2020-04-24 ENCOUNTER — Other Ambulatory Visit: Payer: Self-pay

## 2020-04-24 DIAGNOSIS — R2689 Other abnormalities of gait and mobility: Secondary | ICD-10-CM

## 2020-04-24 DIAGNOSIS — M25662 Stiffness of left knee, not elsewhere classified: Secondary | ICD-10-CM

## 2020-04-24 DIAGNOSIS — R600 Localized edema: Secondary | ICD-10-CM

## 2020-04-24 DIAGNOSIS — M6281 Muscle weakness (generalized): Secondary | ICD-10-CM

## 2020-04-24 DIAGNOSIS — M25562 Pain in left knee: Secondary | ICD-10-CM | POA: Diagnosis not present

## 2020-04-24 DIAGNOSIS — G8929 Other chronic pain: Secondary | ICD-10-CM | POA: Diagnosis not present

## 2020-04-24 NOTE — Therapy (Signed)
[place], [place], [address] Phone: [phone]   Fax:  [phone]  Physical Therapy Treatment  Patient Details  Name: Leslie Davis MRN: [id number] Date of Birth: [birth year] Referring Provider (PT): Erlinda Hong, MD   Encounter Date: 04/24/2020   PT End of Session - 04/24/20 0935    Visit Number 4    Number of Visits 16    Date for PT Re-Evaluation 06/09/20    Authorization Type U[employer] MCR    Progress Note Due on Visit 10    PT Start Time 0930    PT Stop Time 1016    PT Time Calculation (min) 46 min    Activity Tolerance Patient tolerated treatment well    Behavior During Therapy [facility] for tasks assessed/performed           Past Medical History:  Diagnosis Date  . DM (diabetes mellitus) ([place])   . Fatty liver disease, nonalcoholic   . Gastritis   . Gastroparesis   . GERD (gastroesophageal reflux disease)   . Hiatal hernia   . Hiatal hernia   . HLD (hyperlipidemia)   . HTN (hypertension)   . Renal calculus     Past Surgical History:  Procedure Laterality Date  . CHOLECYSTECTOMY    . COLONOSCOPY    . TUBAL LIGATION    . UPPER GASTROINTESTINAL ENDOSCOPY      There were no vitals filed for this visit.   Subjective Assessment - 04/24/20 0934    Subjective Patient relays pain is still 7/10 today    Pertinent History PMH" Rt knee menisectomy repair >1 year ago per pt    Limitations Standing;Walking;House hold activities;Other (comment)    How long can you stand comfortably? 20 min    How long can you walk comfortably? 20 min    Diagnostic tests Knee XR 02/06/20: Right knee shows mild to moderate degenerative changes to the  Medial and patellofemoral compartments, Lt knee shows moderated medial Patellafemoral OA"    Patient Stated Goals make my knee better, try to avoid another surgery,    Currently in Pain? Yes    Pain Score 7     Pain Location Knee    Pain Orientation Left    Pain Descriptors / Indicators  Aching    Pain Onset More than a month ago              [facility] Adult PT Treatment/Exercise - 04/24/20 0001      Knee/Hip Exercises: Stretches   Gastroc Stretch Both;3 reps;30 seconds    Gastroc Stretch Limitations slantboard    Soleus Stretch Both;2 reps;30 seconds    Soleus Stretch Limitations slantboard      Knee/Hip Exercises: Aerobic   Nustep L4 X 8 min UE/LE   seat 8     Knee/Hip Exercises: Standing   Heel Raises Both;2 sets;10 reps   with UE and toe raises   Terminal Knee Extension --    Theraband Level (Terminal Knee Extension) --    Hip ADduction --    Hip ADduction Limitations --    Hip Abduction 2 sets;10 reps   green tband   Hip Extension Stengthening;2 sets;10 reps   green tband   Other Standing Knee Exercises tandem stance with UE PRN 20 sec bilat x4      Knee/Hip Exercises: Seated   Long Arc Quad Left;2 sets;10 reps      Knee/Hip Exercises: Supine   Short Arc Quad Sets 2 sets;5 reps;Left  Short Arc Ambulance person with [employer] Both;2 sets;10 reps   5 sec hold   Straight Leg Raises 2 sets;10 reps    Other Supine Knee/Hip Exercises clams 10x red band      Modalities   Modalities Vasopneumatic      Vasopneumatic   Number Minutes Vasopneumatic  10 minutes    Vasopnuematic Location  Knee    Vasopneumatic Pressure Medium    Vasopneumatic Temperature  34                    PT Short Term Goals - 04/14/20 1051      PT SHORT TERM GOAL #1   Title Pt will be I and compliant with HEP.    Time 4    Period Weeks    Status New      PT SHORT TERM GOAL #2   Title Pt will improve FOTO to 66%    Time 4    Period Weeks    Status New             PT Long Term Goals - 04/14/20 1053      PT LONG TERM GOAL #1   Title Pt will improve FOTO to 70%    Time 8    Period Weeks    Status New    Target Date 06/09/20      PT LONG TERM GOAL #2   Title Pt will improve Rt knee flexion AROM to 125 deg and improve patella  hypomobility from moderate to mild.    Time 8    Period Weeks    Status New      PT LONG TERM GOAL #3   Title Pt will improve Lt hip/knee strength to overall 4+ to improve function.    Time 8    Period Weeks    Status New      PT LONG TERM GOAL #4   Title Pt will report less than 3-4/10 pain with standing/walking at least 30 minutes and negotiating her 3 steps to enter her house.                 Plan - 04/24/20 1019    Clinical Impression Statement Leslie Davis was able to tolerate additional LE strength exercises well today without increased pain. Patient can benefit from adding dynamic and static balance activities to ensure proper stabilization of her knee.    Personal Factors and Comorbidities Time since onset of injury/illness/exacerbation    Examination-Activity Limitations Bend;Stairs;Stand;Lift;Squat;Locomotion Level    Examination-Participation Restrictions Community Activity;Driving;Occupation;Cleaning    Stability/Clinical Decision Making Stable/Uncomplicated    Rehab Potential Good    PT Frequency 2x / week   1-2   PT Duration 8 weeks    PT Treatment/Interventions ADLs/Self Care Home Management;Aquatic Therapy;Cryotherapy;Electrical Stimulation;Iontophoresis 4mg /ml Dexamethasone;Moist Heat;Ultrasound;Stair training;Therapeutic activities;Therapeutic exercise;Balance training;Neuromuscular re-education;Manual techniques;Passive range of motion;Dry needling;Joint Manipulations;Vasopneumatic Device;Taping    PT Next Visit Plan review and update HEP PRN, add weight against gravity, ankle weights nd Tband to provide additional quad strength and progress to stairs prn, add in balance activities    PT Home Exercise Plan Access Code [facility]    Consulted and Agree with Plan of Care Patient           Patient will benefit from skilled therapeutic intervention in order to improve the following deficits and impairments:  Decreased activity tolerance,Decreased balance,Decreased  strength,Decreased endurance,Decreased range of motion,Increased edema,Hypomobility,Difficulty walking,Increased fascial restricitons,Increased muscle spasms,Impaired flexibility,Pain  Visit Diagnosis: Chronic pain of left knee  Muscle weakness (generalized)  Stiffness of left knee, not elsewhere classified  Localized edema  Other abnormalities of gait and mobility     Problem List Patient Active Problem List   Diagnosis Date Noted  . AKI (acute kidney injury) ([place]) 09/07/2019  . Acute kidney injury ([place]) 09/06/2019  . Bradycardia 09/06/2019  . Hyperglycemia 09/06/2019  . HLD (hyperlipidemia)   . Hypertensive nephropathy 05/16/2019  . Erosive gastritis 11/16/2017  . Fatty liver 11/16/2017  . Abdominal pain 12/11/2012  . Early satiety 12/11/2012  . Hiatal hernia   . Hx of cholecystectomy 02/05/2011  . Flu-like symptoms 02/05/2011  . CONSTIPATION 08/27/2008  . FATTY LIVER DISEASE 05/07/2008  . Nausea with vomiting 04/23/2008  . EPIGASTRIC PAIN 04/23/2008  . RENAL CALCULUS 04/22/2008  . Type 2 diabetes mellitus with stage 2 chronic kidney disease, with long-term current use of insulin ([place]) 05/19/2006  . Type 2 diabetes mellitus with hyperlipidemia ([place]) 05/19/2006  . HYPERTENSION, BENIGN SYSTEMIC 05/19/2006  . GASTROESOPHAGEAL REFLUX, NO ESOPHAGITIS 05/19/2006  . AMENORRHEA 05/19/2006    Glenetta Hew, SPT 04/24/2020, 10:36 AM   During this treatment session, this physical therapist was present, participating in and directing the treatment.   This note has been reviewed and this clinician agrees with the information provided.  Elsie Ra, PT, DPT 04/24/20 10:51 AM   [facility] [address] [place], [place], [address] Phone: [phone]   Fax:  [phone]  Name: Leslie Davis MRN: [id number] Date of Birth: April 10, 1955

## 2020-04-28 ENCOUNTER — Telehealth: Payer: Self-pay

## 2020-04-28 NOTE — Telephone Encounter (Signed)
Calling in blood sugars   Mon- 105 Tue-118 Wed-117 Thur-113

## 2020-04-29 ENCOUNTER — Other Ambulatory Visit: Payer: Self-pay

## 2020-04-29 ENCOUNTER — Ambulatory Visit (INDEPENDENT_AMBULATORY_CARE_PROVIDER_SITE_OTHER): Payer: Medicare Other | Admitting: Physical Therapy

## 2020-04-29 DIAGNOSIS — M25662 Stiffness of left knee, not elsewhere classified: Secondary | ICD-10-CM | POA: Diagnosis not present

## 2020-04-29 DIAGNOSIS — R2689 Other abnormalities of gait and mobility: Secondary | ICD-10-CM | POA: Diagnosis not present

## 2020-04-29 DIAGNOSIS — R600 Localized edema: Secondary | ICD-10-CM

## 2020-04-29 DIAGNOSIS — G8929 Other chronic pain: Secondary | ICD-10-CM | POA: Diagnosis not present

## 2020-04-29 DIAGNOSIS — M25562 Pain in left knee: Secondary | ICD-10-CM | POA: Diagnosis not present

## 2020-04-29 DIAGNOSIS — M6281 Muscle weakness (generalized): Secondary | ICD-10-CM

## 2020-04-29 NOTE — Therapy (Signed)
[place], [place], [address] Phone: [phone]   Fax:  [phone]  Physical Therapy Treatment  Patient Details  Name: Leslie Davis MRN: [id number] Date of Birth: 1955-08-29 Referring Provider (PT): Erlinda Hong, MD   Encounter Date: 04/29/2020   PT End of Session - 04/29/20 1154    Visit Number 5    Number of Visits 16    Date for PT Re-Evaluation 06/09/20    Authorization Type UHC MCR    Progress Note Due on Visit 10    PT Start Time 1010    PT Stop Time 1055    PT Time Calculation (min) 45 min    Activity Tolerance Patient tolerated treatment well    Behavior During Therapy [facility] for tasks assessed/performed           Past Medical History:  Diagnosis Date  . DM (diabetes mellitus) ([place])   . Fatty liver disease, nonalcoholic   . Gastritis   . Gastroparesis   . GERD (gastroesophageal reflux disease)   . Hiatal hernia   . Hiatal hernia   . HLD (hyperlipidemia)   . HTN (hypertension)   . Renal calculus     Past Surgical History:  Procedure Laterality Date  . CHOLECYSTECTOMY    . COLONOSCOPY    . TUBAL LIGATION    . UPPER GASTROINTESTINAL ENDOSCOPY      There were no vitals filed for this visit.   Subjective Assessment - 04/29/20 1012    Subjective Patient relays that her pain is 6/10. She still notes trouble with walking especially on the treadmill she is not able to walk for long.    Pertinent History PMH" Rt knee menisectomy repair >1 year ago per pt    Limitations Standing;Walking;House hold activities;Other (comment)    How long can you stand comfortably? 20 min    How long can you walk comfortably? 20 min    Diagnostic tests Knee XR 02/06/20: Right knee shows mild to moderate degenerative changes to the  Medial and patellofemoral compartments, Lt knee shows moderated medial Patellafemoral OA"    Patient Stated Goals make my knee better, try to avoid another surgery,    Pain Onset More than a month  ago              [facility] PT Assessment - 04/29/20 0001      PROM   Left Knee Flexion 131           [facility] Adult PT Treatment/Exercise - 04/29/20 0001      Knee/Hip Exercises: Stretches   Passive Hamstring Stretch Left;3 reps;30 seconds    Gastroc Stretch Both;2 reps;20 seconds    Gastroc Stretch Limitations slantboard    Soleus Stretch 2 reps;Both;20 seconds    Soleus Stretch Limitations slantboard      Knee/Hip Exercises: Aerobic   Nustep L5x 8 min UE/LE      Knee/Hip Exercises: Standing   Heel Raises Both;2 sets;10 reps   with toe raises   Hip Abduction 2 sets;10 reps    Abduction Limitations green tband    Hip Extension Stengthening;2 sets;10 reps    Extension Limitations green tband    Step Down Left;1 set;10 reps;Hand Hold: 2;Step Height: 2"    Other Standing Knee Exercises TKE with greeen Tband 2x10    Other Standing Knee Exercises tandem stance with UE PRN 20 sec bilat x4, SLS 30 sec, airex lateral step up and over 10x      Knee/Hip Exercises:  Seated   Long Arc Quad Left;2 sets;10 reps;Weights   1lbs   Long Arc Quad Weight 1 lbs.    Heel Slides AAROM;10 reps   ending measurement 131     Knee/Hip Exercises: Supine   Short Arc Quad Sets 2 sets;10 reps;Left    Short Arc Quad Sets Limitations 2 sec hold    Bridges 10 reps    Bridges with [employer] Both;1 set;10 reps    Straight Leg Raises 1 set;10 reps;Left    Straight Leg Raises Limitations 1lb weight    Other Supine Knee/Hip Exercises clams 2x10 red band      Knee/Hip Exercises: Prone   Hamstring Curl 15 reps   L only                   PT Short Term Goals - 04/14/20 1051      PT SHORT TERM GOAL #1   Title Pt will be I and compliant with HEP.    Time 4    Period Weeks    Status New      PT SHORT TERM GOAL #2   Title Pt will improve FOTO to 66%    Time 4    Period Weeks    Status New             PT Long Term Goals - 04/29/20 1219      PT LONG TERM GOAL #1   Title Pt will  improve FOTO to 70%    Time 8    Period Weeks    Status New      PT LONG TERM GOAL #2   Title Pt will improve Rt knee flexion AROM to 125 deg and improve patella hypomobility from moderate to mild.    Time 8    Period Weeks    Status Achieved      PT LONG TERM GOAL #3   Title Pt will improve Lt hip/knee strength to overall 4+ to improve function.    Time 8    Period Weeks    Status New      PT LONG TERM GOAL #4   Title Pt will report less than 3-4/10 pain with standing/walking at least 30 minutes and negotiating her 3 steps to enter her house.                 Plan - 04/29/20 1152    Clinical Impression Statement Leslie Davis is slowly increasing her LE strength. Pain limits what she is able to do at home on the steps. Reccomended smaller step ups to progress toward a full step, will continue to work on this and her dynamic balance for increased function. Her knee ROM is now WNL.    Personal Factors and Comorbidities Time since onset of injury/illness/exacerbation    Examination-Activity Limitations Bend;Stairs;Stand;Lift;Squat;Locomotion Level    Examination-Participation Restrictions Community Activity;Driving;Occupation;Cleaning    Stability/Clinical Decision Making Stable/Uncomplicated    Rehab Potential Good    PT Frequency 2x / week   1-2   PT Duration 8 weeks    PT Treatment/Interventions ADLs/Self Care Home Management;Aquatic Therapy;Cryotherapy;Electrical Stimulation;Iontophoresis 4mg /ml Dexamethasone;Moist Heat;Ultrasound;Stair training;Therapeutic activities;Therapeutic exercise;Balance training;Neuromuscular re-education;Manual techniques;Passive range of motion;Dry needling;Joint Manipulations;Vasopneumatic Device;Taping    PT Next Visit Plan add weight against gravity, ankle weights nd Tband to provide additional quad strength and progress to stairs prn, add in balance activities    PT Home Exercise Plan Access Code [facility]    Consulted and Agree with Plan of Care  Patient  Patient will benefit from skilled therapeutic intervention in order to improve the following deficits and impairments:  Decreased activity tolerance,Decreased balance,Decreased strength,Decreased endurance,Decreased range of motion,Increased edema,Hypomobility,Difficulty walking,Increased fascial restricitons,Increased muscle spasms,Impaired flexibility,Pain  Visit Diagnosis: Chronic pain of left knee  Muscle weakness (generalized)  Stiffness of left knee, not elsewhere classified  Localized edema  Other abnormalities of gait and mobility     Problem List Patient Active Problem List   Diagnosis Date Noted  . AKI (acute kidney injury) ([place]) 09/07/2019  . Acute kidney injury ([place]) 09/06/2019  . Bradycardia 09/06/2019  . Hyperglycemia 09/06/2019  . HLD (hyperlipidemia)   . Hypertensive nephropathy 05/16/2019  . Erosive gastritis 11/16/2017  . Fatty liver 11/16/2017  . Abdominal pain 12/11/2012  . Early satiety 12/11/2012  . Hiatal hernia   . Hx of cholecystectomy 02/05/2011  . Flu-like symptoms 02/05/2011  . CONSTIPATION 08/27/2008  . FATTY LIVER DISEASE 05/07/2008  . Nausea with vomiting 04/23/2008  . EPIGASTRIC PAIN 04/23/2008  . RENAL CALCULUS 04/22/2008  . Type 2 diabetes mellitus with stage 2 chronic kidney disease, with long-term current use of insulin ([place]) 05/19/2006  . Type 2 diabetes mellitus with hyperlipidemia ([place]) 05/19/2006  . HYPERTENSION, BENIGN SYSTEMIC 05/19/2006  . GASTROESOPHAGEAL REFLUX, NO ESOPHAGITIS 05/19/2006  . AMENORRHEA 05/19/2006    Glenetta Hew, SPT 04/29/2020, 12:20 PM   During this treatment session, this physical therapist was present, participating in and directing the treatment.   This note has been reviewed and this clinician agrees with the information provided.  Elsie Ra, PT, DPT 04/29/20 1:14 PM   [facility] [address] [place], [place], [address] Phone:  [phone]   Fax:  [phone]  Name: Leslie Davis MRN: [id number] Date of Birth: May 24, 1955

## 2020-05-01 ENCOUNTER — Other Ambulatory Visit: Payer: Self-pay

## 2020-05-01 ENCOUNTER — Ambulatory Visit (INDEPENDENT_AMBULATORY_CARE_PROVIDER_SITE_OTHER): Payer: Medicare Other | Admitting: Physical Therapy

## 2020-05-01 ENCOUNTER — Telehealth: Payer: Self-pay | Admitting: Orthopaedic Surgery

## 2020-05-01 DIAGNOSIS — R2689 Other abnormalities of gait and mobility: Secondary | ICD-10-CM

## 2020-05-01 DIAGNOSIS — R600 Localized edema: Secondary | ICD-10-CM | POA: Diagnosis not present

## 2020-05-01 DIAGNOSIS — G8929 Other chronic pain: Secondary | ICD-10-CM | POA: Diagnosis not present

## 2020-05-01 DIAGNOSIS — M6281 Muscle weakness (generalized): Secondary | ICD-10-CM | POA: Diagnosis not present

## 2020-05-01 DIAGNOSIS — M25662 Stiffness of left knee, not elsewhere classified: Secondary | ICD-10-CM | POA: Diagnosis not present

## 2020-05-01 DIAGNOSIS — M25562 Pain in left knee: Secondary | ICD-10-CM

## 2020-05-01 NOTE — Telephone Encounter (Signed)
Pt called stating a dietition was supposed to reach out to her as well as someone to do a fitting of her knee for brace, and neither has reached out. Pt would like our office to check on both of these and give her a CB with an update please  [phone]

## 2020-05-01 NOTE — Therapy (Signed)
[place], [place], [address] Phone: [phone]   Fax:  [phone]  Physical Therapy Treatment  Patient Details  Name: Leslie Davis MRN: [id number] Date of Birth: Jan 15, 1956 Referring Provider (PT): Erlinda Hong, MD   Encounter Date: 05/01/2020   PT End of Session - 05/01/20 1142    Visit Number 6    Number of Visits 16    Date for PT Re-Evaluation 06/09/20    Authorization Type UHC MCR    Progress Note Due on Visit 10    PT Start Time 1101    PT Stop Time 1151    PT Time Calculation (min) 50 min    Activity Tolerance Patient tolerated treatment well    Behavior During Therapy [facility] for tasks assessed/performed           Past Medical History:  Diagnosis Date  . DM (diabetes mellitus) ([place])   . Fatty liver disease, nonalcoholic   . Gastritis   . Gastroparesis   . GERD (gastroesophageal reflux disease)   . Hiatal hernia   . Hiatal hernia   . HLD (hyperlipidemia)   . HTN (hypertension)   . Renal calculus     Past Surgical History:  Procedure Laterality Date  . CHOLECYSTECTOMY    . COLONOSCOPY    . TUBAL LIGATION    . UPPER GASTROINTESTINAL ENDOSCOPY      There were no vitals filed for this visit.   Subjective Assessment - 05/01/20 1111    Subjective Patient is having 6/10 pain. She is fine when she wakes up but after she gets moving she has increased pain.    Pertinent History PMH" Rt knee menisectomy repair >1 year ago per pt    Limitations Standing;Walking;House hold activities;Other (comment)    How long can you stand comfortably? 20 min    How long can you walk comfortably? 20 min    Diagnostic tests Knee XR 02/06/20: Right knee shows mild to moderate degenerative changes to the  Medial and patellofemoral compartments, Lt knee shows moderated medial Patellafemoral OA"    Patient Stated Goals make my knee better, try to avoid another surgery,    Pain Score 6     Pain Location Knee    Pain Onset More  than a month ago                             [facility] Adult PT Treatment/Exercise - 05/01/20 0001      Knee/Hip Exercises: Stretches   Gastroc Stretch Both;2 reps;30 seconds    Gastroc Stretch Limitations slantboard    Soleus Stretch 2 reps;Both;30 seconds    Soleus Stretch Limitations slantboard      Knee/Hip Exercises: Aerobic   Nustep L5x 8 min UE/LE      Knee/Hip Exercises: Standing   Hip Abduction 2 sets;10 reps    Abduction Limitations green tband    Hip Extension Stengthening;2 sets;10 reps    Extension Limitations green tband    Step Down Left;1 set;10 reps;Hand Hold: 2;Step Height: 2"    Rocker Board 2 minutes   balance with UE   Other Standing Knee Exercises TKE with greeen Tband 2x10    Other Standing Knee Exercises tandem stance with UE PRN 20 sec bilat x2, SLS 30 sec,      Knee/Hip Exercises: Seated   Long Arc Quad Left;2 sets;10 reps;Weights    Long Arc Quad Weight 1 lbs.  Knee/Hip Exercises: Supine   Short Arc Quad Sets 2 sets;10 reps;Left;Other (comment)   1 lbs ankle weight   Short Arc Quad Sets Limitations 2 sec hold    Bridges 10 reps    Bridges with [employer] Both;1 set;10 reps   3 sec hold   Straight Leg Raises 2 sets;Left;10 reps    Other Supine Knee/Hip Exercises clams 2x10 green band    Other Supine Knee/Hip Exercises hamstring curl on mini ball 15x      Vasopneumatic   Number Minutes Vasopneumatic  10 minutes    Vasopnuematic Location  Knee    Vasopneumatic Pressure Medium    Vasopneumatic Temperature  34                    PT Short Term Goals - 04/14/20 1051      PT SHORT TERM GOAL #1   Title Pt will be I and compliant with HEP.    Time 4    Period Weeks    Status New      PT SHORT TERM GOAL #2   Title Pt will improve FOTO to 66%    Time 4    Period Weeks    Status New             PT Long Term Goals - 04/29/20 1219      PT LONG TERM GOAL #1   Title Pt will improve FOTO to 70%    Time 8     Period Weeks    Status New      PT LONG TERM GOAL #2   Title Pt will improve Rt knee flexion AROM to 125 deg and improve patella hypomobility from moderate to mild.    Time 8    Period Weeks    Status Achieved      PT LONG TERM GOAL #3   Title Pt will improve Lt hip/knee strength to overall 4+ to improve function.    Time 8    Period Weeks    Status New      PT LONG TERM GOAL #4   Title Pt will report less than 3-4/10 pain with standing/walking at least 30 minutes and negotiating her 3 steps to enter her house.                 Plan - 05/01/20 1143    Clinical Impression Statement Patient LE strength is progressing as demonstrated by her ability to tolerate multiple therepeutic exercises without breaks. She does find a pain limitation with increase L WB with SLS and stairs. Slowy progress the time she is able to spend on LLE to increase function and meet LTGs.    Personal Factors and Comorbidities Time since onset of injury/illness/exacerbation    Examination-Activity Limitations Bend;Stairs;Stand;Lift;Squat;Locomotion Level    Examination-Participation Restrictions Community Activity;Driving;Occupation;Cleaning    Stability/Clinical Decision Making Stable/Uncomplicated    Rehab Potential Good    PT Frequency 2x / week   1-2   PT Duration 8 weeks    PT Treatment/Interventions ADLs/Self Care Home Management;Aquatic Therapy;Cryotherapy;Electrical Stimulation;Iontophoresis 4mg /ml Dexamethasone;Moist Heat;Ultrasound;Stair training;Therapeutic activities;Therapeutic exercise;Balance training;Neuromuscular re-education;Manual techniques;Passive range of motion;Dry needling;Joint Manipulations;Vasopneumatic Device;Taping    PT Next Visit Plan increase ankle weights to patient tolerance, dynamic balance- balance with cone taps, 2'' step work    PT Home Exercise Plan Access Code [facility]    Consulted and Agree with Plan of Care Patient           Patient will benefit from  skilled  therapeutic intervention in order to improve the following deficits and impairments:  Decreased activity tolerance,Decreased balance,Decreased strength,Decreased endurance,Decreased range of motion,Increased edema,Hypomobility,Difficulty walking,Increased fascial restricitons,Increased muscle spasms,Impaired flexibility,Pain  Visit Diagnosis: Chronic pain of left knee  Muscle weakness (generalized)  Stiffness of left knee, not elsewhere classified  Localized edema  Other abnormalities of gait and mobility     Problem List Patient Active Problem List   Diagnosis Date Noted  . AKI (acute kidney injury) ([place]) 09/07/2019  . Acute kidney injury ([place]) 09/06/2019  . Bradycardia 09/06/2019  . Hyperglycemia 09/06/2019  . HLD (hyperlipidemia)   . Hypertensive nephropathy 05/16/2019  . Erosive gastritis 11/16/2017  . Fatty liver 11/16/2017  . Abdominal pain 12/11/2012  . Early satiety 12/11/2012  . Hiatal hernia   . Hx of cholecystectomy 02/05/2011  . Flu-like symptoms 02/05/2011  . CONSTIPATION 08/27/2008  . FATTY LIVER DISEASE 05/07/2008  . Nausea with vomiting 04/23/2008  . EPIGASTRIC PAIN 04/23/2008  . RENAL CALCULUS 04/22/2008  . Type 2 diabetes mellitus with stage 2 chronic kidney disease, with long-term current use of insulin ([place]) 05/19/2006  . Type 2 diabetes mellitus with hyperlipidemia ([place]) 05/19/2006  . HYPERTENSION, BENIGN SYSTEMIC 05/19/2006  . GASTROESOPHAGEAL REFLUX, NO ESOPHAGITIS 05/19/2006  . AMENORRHEA 05/19/2006    Glenetta Hew, SPT 05/01/2020, 12:34 PM   During this treatment session, this physical therapist was present, participating in and directing the treatment.   This note has been reviewed and this clinician agrees with the information provided.  Elsie Ra, PT, DPT 05/01/20 1:37 PM   [facility] [address] [place], [place], [address] Phone: [phone]   Fax:  [phone]  Name: Leslie Davis MRN: [id number] Date of Birth: 29-Aug-1955

## 2020-05-02 NOTE — Telephone Encounter (Signed)
Referral for weightloss was sent to Dr. Leafy Ro, they will review call her to set up for seminar

## 2020-05-02 NOTE — Telephone Encounter (Signed)
Do we have any updates on referral for weight loss?

## 2020-05-02 NOTE — Telephone Encounter (Signed)
Leslie Davis will fit her for brace.

## 2020-05-02 NOTE — Telephone Encounter (Signed)
Patient aware.

## 2020-05-06 ENCOUNTER — Telehealth: Payer: Self-pay

## 2020-05-06 ENCOUNTER — Other Ambulatory Visit: Payer: Self-pay

## 2020-05-06 ENCOUNTER — Other Ambulatory Visit: Payer: Self-pay | Admitting: Internal Medicine

## 2020-05-06 ENCOUNTER — Ambulatory Visit (INDEPENDENT_AMBULATORY_CARE_PROVIDER_SITE_OTHER): Payer: Medicare Other | Admitting: Physical Therapy

## 2020-05-06 DIAGNOSIS — G8929 Other chronic pain: Secondary | ICD-10-CM | POA: Diagnosis not present

## 2020-05-06 DIAGNOSIS — M6281 Muscle weakness (generalized): Secondary | ICD-10-CM | POA: Diagnosis not present

## 2020-05-06 DIAGNOSIS — R2689 Other abnormalities of gait and mobility: Secondary | ICD-10-CM

## 2020-05-06 DIAGNOSIS — M25662 Stiffness of left knee, not elsewhere classified: Secondary | ICD-10-CM | POA: Diagnosis not present

## 2020-05-06 DIAGNOSIS — R600 Localized edema: Secondary | ICD-10-CM | POA: Diagnosis not present

## 2020-05-06 DIAGNOSIS — M25562 Pain in left knee: Secondary | ICD-10-CM

## 2020-05-06 DIAGNOSIS — G4700 Insomnia, unspecified: Secondary | ICD-10-CM

## 2020-05-06 MED ORDER — TEMAZEPAM 30 MG PO CAPS: ORAL_CAPSULE | ORAL | 0 refills | Status: DC

## 2020-05-06 NOTE — Chronic Care Management (AMB) (Signed)
Chronic Care Management Pharmacy Assistant   Name: Leslie Davis  MRN: [id number] DOB: 01-21-1956  Reason for Encounter: Medication Review  Patient Questions:  1.  Have you seen any other providers since your last visit? Yes, 04/01/2020- Dr Erlinda Hong (Ortho); 03/23/2020- Elsie Ra, PT (Rehab); 04/18/2020- Elsie Ra, PT (Rehab); 04/21/2020- Angel Little (CCM); 04/22/2020- Dr Erlinda Hong (Ortho);  04/22/2020- Elsie Ra, PT (Rehab); 04/24/2020- Elsie Ra, PT (Rehab); 04/29/2020- Elsie Ra, PT (Rehab); 05/01/2020- Elsie Ra, PT (Rehab).   2.  Any changes in your medicines or health? No  PCP : [name], MD  Allergies:   Allergies  Allergen Reactions   Fiasp [Insulin Aspart (W-Niacinamide)] Hives    Medications: Outpatient Encounter Medications as of 05/06/2020  Medication Sig   amLODipine (NORVASC) 10 MG tablet Take 1 tablet (10 mg total) by mouth daily.   aspirin EC 81 MG tablet Take 1 tablet (81 mg total) by mouth daily.   Azilsartan Medoxomil (EDARBI) 80 MG TABS TAKE 1 TABLET EACH DAY.   benzonatate (TESSALON PERLES) 100 MG capsule Take 1 capsule (100 mg total) by mouth every 6 (six) hours as needed for cough.   Blood Glucose Monitoring Suppl (ONETOUCH VERIO) w/Device KIT Use as directed to check blood sugars 2 times per day dx: e11.65   cetirizine (ZYRTEC) 10 MG tablet Take 1 tablet (10 mg total) by mouth daily.   Cholecalciferol (VITAMIN D3) 1.25 MG (50000 UT) CAPS Take 1 capsule by mouth on Tuesday and Friday   Continuous Blood Gluc Receiver (FREESTYLE LIBRE 14 DAY READER) DEVI by Does not apply route.   Continuous Blood Gluc Sensor (FREESTYLE LIBRE 14 DAY SENSOR) MISC Use as directed to check blood sugars 4 times per day dx: e11.65   ferrous gluconate (FERGON) 324 MG tablet TAKE 1 TABLET BY MOUTH TWICE DAILY WITH A MEAL.   glucose blood (ONETOUCH VERIO) test strip Use as instructed to check blood sugars 2 times per day dx:e11.65   insulin glargine, 1 Unit Dial,  (TOUJEO SOLOSTAR) 300 UNIT/ML Solostar Pen Inject 50 units subcutaneous at bedtime   insulin lispro (HUMALOG KWIKPEN) 100 UNIT/ML KwikPen Sliding scale   Insulin Pen Needle 32G X 6 MM MISC Use with pen injectors daily dx code: e11.9   Magnesium 400 MG TABS One tab po qpm   OneTouch Delica Lancets 40J MISC Use as directed to check blood sugars 2 times per day dx:e11.65   pantoprazole (PROTONIX) 40 MG tablet Take one tablet by mouth 3 days a week   rosuvastatin (CRESTOR) 20 MG tablet Take 1 tablet (20 mg total) by mouth daily.   Semaglutide,0.25 or 0.5MG /DOS, (OZEMPIC, 0.25 OR 0.5 MG/DOSE,) 2 MG/1.5ML SOPN Inject 0.25 mg into the skin once a week.   temazepam (RESTORIL) 30 MG capsule One capsules po qhs prn   traMADol (ULTRAM) 50 MG tablet Take 1 tablet (50 mg total) by mouth every 12 (twelve) hours as needed.   No facility-administered encounter medications on file as of 05/06/2020.    Current Diagnosis: Patient Active Problem List   Diagnosis Date Noted   AKI (acute kidney injury) ([place]) 09/07/2019   Acute kidney injury ([place]) 09/06/2019   Bradycardia 09/06/2019   Hyperglycemia 09/06/2019   HLD (hyperlipidemia)    Hypertensive nephropathy 05/16/2019   Erosive gastritis 11/16/2017   Fatty liver 11/16/2017   Abdominal pain 12/11/2012   Early satiety 12/11/2012   Hiatal hernia    Hx of cholecystectomy 02/05/2011   Flu-like symptoms 02/05/2011   CONSTIPATION 08/27/2008  FATTY LIVER DISEASE 05/07/2008   Nausea with vomiting 04/23/2008   EPIGASTRIC PAIN 04/23/2008   RENAL CALCULUS 04/22/2008   Type 2 diabetes mellitus with stage 2 chronic kidney disease, with long-term current use of insulin ([place]) 05/19/2006   Type 2 diabetes mellitus with hyperlipidemia ([place]) 05/19/2006   HYPERTENSION, BENIGN SYSTEMIC 05/19/2006   GASTROESOPHAGEAL REFLUX, NO ESOPHAGITIS 05/19/2006   AMENORRHEA 05/19/2006   Reviewed chart for medication changes ahead of medication  coordination call.  OV and Consults-04/01/2020- Dr Erlinda Hong (Ortho); 03/23/2020- Elsie Ra, PT (Rehab); 04/18/2020- Elsie Ra, PT (Rehab); 04/21/2020- Angel Little (CCM); 04/22/2020- Dr Erlinda Hong (Ortho);  04/22/2020- Elsie Ra, PT (Rehab); 04/24/2020- Elsie Ra, PT (Rehab); 04/29/2020- Elsie Ra, PT (Rehab); 05/01/2020- Elsie Ra, PT (Rehab) since last care coordination call/Pharmacist visit.   No medication changes indicated.  BP Readings from Last 3 Encounters:  02/21/20 (!) 152/68  11/15/19 128/78  11/15/19 128/78    Lab Results  Component Value Date   HGBA1C 8.8 (H) 02/21/2020     Patient obtains medications through Vials  90 Days   Last adherence delivery included:  Amlodipine 10 mg-1 tablet daily Rosuvastatin20 mg-1 tablet daily Cetirizine 10 mg-1 tablet daily ASA 81 mg-1 tablet daily Edarbi 80 mg-1 tablet daily Hydrochlorothiazide 12.5 mg- 1 tablet daily Crestor 20 mg- 1 tablet daily Pantoprazole 40 mg-1 tablet every other day Tramadol 50 mg- 1 tablet by mouth three times daily as needed Pantoprazole 40 mg- 1 tablet three times a week. Onetouch Verio Test Strips- Use to check blood sugars twice daily Trueplus Pen 31g 51mm- Use with pen to inject insulin daily Temazepam 30 mg- 1 capsule at bedtime as needed.   Patient declined the following medications last month: Clindamycin lotion 1%- apply to skin in the morning dailydue to PRN use received a supply on 01/09/2020. Ferrous Gluconate 324 mg-1 tablet twice daily due to receiving a 90 day supply on 01/09/2020. Toujeo-50 unitssq nightlydue to receiving an 81 day supply on 01/09/2020. Flonase NS 1-2 sprayseach nostril once daily.Due to PRN use received a 90 day supply on 10/20/201. Humalog Kwikpen 100 units/ml-Inject as directed per sliding scale due to having an adequate supply. Magnesium Oxide 400 mg- 1 tablet every evening due to having a #30 on hand. Patient states she used all of the 200 mg tablets that were  sent a month ago, she took 2 a day to get the 400 mg dosage.   Vitamin D 50000 units-1 capsule Tuesday and Friday  Tramadol 50 mg- 1 tablet by mouth three times daily as needed Ozempic 2/1.5 ml- Inject 0.25 mg sq once a week             Due to receiving a 90 day supply on 02/08/2020.  Patient is due for next adherence delivery on: 05/07/2020. Called patient and reviewed medications and coordinated delivery.  This delivery to include: Toujeo-50 unitssq nightly Temazepam 30 mg- 1 capsule at bedtime as needed.  Ozempic 2/1.5 ml- Inject 0.25 mg sq once a week Vitamin D 50000 units-1 capsule Tuesday and Friday   Patient received a short fill of Toujeo on 05/05/2020, will receive remainder of supply with 05/07/2020 delivery.   Patient declined the following medications:  Amlodipine 10 mg-1 tablet daily Rosuvastatin20 mg-1 tablet daily Cetirizine 10 mg-1 tablet daily ASA 81 mg-1 tablet daily Edarbi 80 mg-1 tablet daily Hydrochlorothiazide 12.5 mg- 1 tablet daily Crestor 20 mg- 1 tablet daily Pantoprazole 40 mg-1 tablet every other day Tramadol 50 mg- 1 tablet by mouth three times  daily as needed Pantoprazole 40 mg- 1 tablet three times a week. Onetouch Verio Test Strips- Use to check blood sugars twice daily Trueplus Pen 31g 75mm- Use with pen to inject insulin daily     Due to receiving a 90 day supply on 03/07/2020  Clindamycin lotion 1%- apply to skin in the morning dailydue to PRN use received a supply on 01/09/2020. Ferrous Gluconate 324 mg-1 tablet twice daily due to receiving a 90 day supply on 01/09/2020. Flonase NS 1-2 sprayseach nostril once daily.Due to PRN use received a 90 day supply on 10/20/201. Humalog Kwikpen 100 units/ml-Inject as directed per sliding scale due to having an adequate supply. Magnesium Oxide 400 mg- 1 tablet every evening due to having a #30 on hand. Patient states she used all of the 200 mg tablets that were sent a month ago, she took 2  a day to get the 400 mg dosage.   Patient needs refills for Temazepam- request sent.  Confirmed delivery date of 05/07/2020, advised patient that pharmacy will contact them the morning of delivery.   Follow-Up:  Coordination of Enhanced Pharmacy Services and Pharmacist Review  Patient took Humalog 50 units last night instead of Toujeo 50units by mistake, she started experiencing sweating, nervousness and weakness, checked blood sugar and it had dropped to 50 . Patient called 911, EMTs came out and stayed with her until her blood sugars came up to 90, she drank soda and ate Peanut butter crackers. This morning her blood sugar was at 114. Patient aware that she is to only use Humalog 50 units if blood sugars non fasting are over 250.  Patient is checking blood pressure at home but wondering if her monitor is not working, recent readings were 179/82 and she does not think that is right, she has an appt with PCP and will ask to compare their readings with her monitor and/or request a new bp monitor. Patient will also ask [facility] to check bp before physical therapy.  [name], CPP notified  Pattricia Boss, [place] Pharmacist Assistant [phone]

## 2020-05-06 NOTE — Therapy (Signed)
[place], [place], [address] Phone: [phone]   Fax:  [phone]  Physical Therapy Treatment  Patient Details  Name: Leslie Davis MRN: [id number] Date of Birth: 02-19-1956 Referring Provider (PT): Erlinda Hong, MD   Encounter Date: 05/06/2020   PT End of Session - 05/06/20 0947    Visit Number 7    Number of Visits 16    Date for PT Re-Evaluation 06/09/20    Authorization Type UHC MCR    Progress Note Due on Visit 10    PT Start Time 0935    PT Stop Time 1015    PT Time Calculation (min) 40 min    Activity Tolerance Patient tolerated treatment well    Behavior During Therapy [facility] for tasks assessed/performed           Past Medical History:  Diagnosis Date  . DM (diabetes mellitus) ([place])   . Fatty liver disease, nonalcoholic   . Gastritis   . Gastroparesis   . GERD (gastroesophageal reflux disease)   . Hiatal hernia   . Hiatal hernia   . HLD (hyperlipidemia)   . HTN (hypertension)   . Renal calculus     Past Surgical History:  Procedure Laterality Date  . CHOLECYSTECTOMY    . COLONOSCOPY    . TUBAL LIGATION    . UPPER GASTROINTESTINAL ENDOSCOPY      There were no vitals filed for this visit.   Subjective Assessment - 05/06/20 0945    Subjective Patient relays she took too much insulin last night so she is feeling a little woozy today. She says her pain is 5/10 today.    Pertinent History PMH" Rt knee menisectomy repair >1 year ago per pt    Limitations Standing;Walking;House hold activities;Other (comment)    How long can you stand comfortably? 20 min    How long can you walk comfortably? 20 min    Diagnostic tests Knee XR 02/06/20: Right knee shows mild to moderate degenerative changes to the  Medial and patellofemoral compartments, Lt knee shows moderated medial Patellafemoral OA"    Patient Stated Goals make my knee better, try to avoid another surgery,    Currently in Pain? Yes    Pain Score 5      Pain Location Knee    Pain Orientation Left    Pain Descriptors / Indicators Aching    Pain Type Chronic pain    Pain Onset More than a month ago            [facility] Adult PT Treatment/Exercise - 05/06/20 0001      Knee/Hip Exercises: Stretches   Passive Hamstring Stretch Left;3 reps;30 seconds   with strap supine   Gastroc Stretch Both;2 reps;30 seconds    Gastroc Stretch Limitations slantboard    Soleus Stretch 2 reps;Both;30 seconds    Soleus Stretch Limitations slantboard      Knee/Hip Exercises: Aerobic   Nustep L5x 8 min UE/LE      Knee/Hip Exercises: Standing   Heel Raises Both;2 sets;10 reps   eccentric focus   Terminal Knee Extension Left;2 sets;10 reps;Theraband    Theraband Level (Terminal Knee Extension) Level 3 (Green)    Terminal Knee Extension Limitations 5 sec hold    Hip Abduction 2 sets;10 reps;Both    Abduction Limitations green tband    Hip Extension Stengthening;2 sets;10 reps;Left    Extension Limitations green tband    Rocker Board 3 minutes   balance M/L, A/P, UE prn  Other Standing Knee Exercises cone tapping alt. standing on airex with UE support 10x each leg, hip hike bilat 5x      Knee/Hip Exercises: Seated   Long Arc Quad Left;2 sets;10 reps;Weights    Long Arc Quad Weight 2 lbs.      Knee/Hip Exercises: Supine   Short Arc Quad Sets 2 sets;10 reps;Left;Other (comment)   1.5 lbs   Short Arc [employer] Limitations 3 sec hold    Bridges with [employer] Both;2 sets;10 reps    Straight Leg Raises 2 sets;Left;10 reps    Other Supine Knee/Hip Exercises clams 2x10 green band                    PT Short Term Goals - 04/14/20 1051      PT SHORT TERM GOAL #1   Title Pt will be I and compliant with HEP.    Time 4    Period Weeks    Status New      PT SHORT TERM GOAL #2   Title Pt will improve FOTO to 66%    Time 4    Period Weeks    Status New             PT Long Term Goals - 04/29/20 1219      PT LONG TERM GOAL #1    Title Pt will improve FOTO to 70%    Time 8    Period Weeks    Status New      PT LONG TERM GOAL #2   Title Pt will improve Rt knee flexion AROM to 125 deg and improve patella hypomobility from moderate to mild.    Time 8    Period Weeks    Status Achieved      PT LONG TERM GOAL #3   Title Pt will improve Lt hip/knee strength to overall 4+ to improve function.    Time 8    Period Weeks    Status New      PT LONG TERM GOAL #4   Title Pt will report less than 3-4/10 pain with standing/walking at least 30 minutes and negotiating her 3 steps to enter her house.                 Plan - 05/06/20 1018    Clinical Impression Statement Today's session focused on LE strengthening with some increased balance and coordination work. She tolerated therepeutic exercises well without increase pain. She does have slight R side trendelenberg with alt SL cone tapping exercise, address glute med/hip weakness bilaterally moving forward.    Personal Factors and Comorbidities Time since onset of injury/illness/exacerbation    Examination-Activity Limitations Bend;Stairs;Stand;Lift;Squat;Locomotion Level    Examination-Participation Restrictions Community Activity;Driving;Occupation;Cleaning    Stability/Clinical Decision Making Stable/Uncomplicated    Rehab Potential Good    PT Frequency 2x / week   1-2   PT Duration 8 weeks    PT Treatment/Interventions ADLs/Self Care Home Management;Aquatic Therapy;Cryotherapy;Electrical Stimulation;Iontophoresis 4mg /ml Dexamethasone;Moist Heat;Ultrasound;Stair training;Therapeutic activities;Therapeutic exercise;Balance training;Neuromuscular re-education;Manual techniques;Passive range of motion;Dry needling;Joint Manipulations;Vasopneumatic Device;Taping    PT Next Visit Plan maintain ankle weights and hip/glute med focus bilaterally,    PT Home Exercise Plan Access Code [facility]    Consulted and Agree with Plan of Care Patient           Patient will  benefit from skilled therapeutic intervention in order to improve the following deficits and impairments:  Decreased activity tolerance,Decreased balance,Decreased strength,Decreased endurance,Decreased range of motion,Increased  edema,Hypomobility,Difficulty walking,Increased fascial restricitons,Increased muscle spasms,Impaired flexibility,Pain  Visit Diagnosis: Chronic pain of left knee  Muscle weakness (generalized)  Stiffness of left knee, not elsewhere classified  Localized edema  Other abnormalities of gait and mobility     Problem List Patient Active Problem List   Diagnosis Date Noted  . AKI (acute kidney injury) ([place]) 09/07/2019  . Acute kidney injury ([place]) 09/06/2019  . Bradycardia 09/06/2019  . Hyperglycemia 09/06/2019  . HLD (hyperlipidemia)   . Hypertensive nephropathy 05/16/2019  . Erosive gastritis 11/16/2017  . Fatty liver 11/16/2017  . Abdominal pain 12/11/2012  . Early satiety 12/11/2012  . Hiatal hernia   . Hx of cholecystectomy 02/05/2011  . Flu-like symptoms 02/05/2011  . CONSTIPATION 08/27/2008  . FATTY LIVER DISEASE 05/07/2008  . Nausea with vomiting 04/23/2008  . EPIGASTRIC PAIN 04/23/2008  . RENAL CALCULUS 04/22/2008  . Type 2 diabetes mellitus with stage 2 chronic kidney disease, with long-term current use of insulin ([place]) 05/19/2006  . Type 2 diabetes mellitus with hyperlipidemia ([place]) 05/19/2006  . HYPERTENSION, BENIGN SYSTEMIC 05/19/2006  . GASTROESOPHAGEAL REFLUX, NO ESOPHAGITIS 05/19/2006  . AMENORRHEA 05/19/2006    Glenetta Hew, SPT 05/06/2020, 10:22 AM   During this treatment session, this physical therapist was present, participating in and directing the treatment.   This note has been reviewed and this clinician agrees with the information provided.  Elsie Ra, PT, DPT 05/06/20 10:27 AM   [facility] Physical Therapy [address] [place], [place], [address] Phone: [phone]   Fax:   [phone]  Name: Leslie Davis MRN: [id number] Date of Birth: 1955/11/30

## 2020-05-08 ENCOUNTER — Ambulatory Visit (INDEPENDENT_AMBULATORY_CARE_PROVIDER_SITE_OTHER): Payer: Medicare Other | Admitting: Physical Therapy

## 2020-05-08 ENCOUNTER — Other Ambulatory Visit: Payer: Self-pay

## 2020-05-08 DIAGNOSIS — M25562 Pain in left knee: Secondary | ICD-10-CM

## 2020-05-08 DIAGNOSIS — M6281 Muscle weakness (generalized): Secondary | ICD-10-CM

## 2020-05-08 DIAGNOSIS — R2689 Other abnormalities of gait and mobility: Secondary | ICD-10-CM | POA: Diagnosis not present

## 2020-05-08 DIAGNOSIS — R600 Localized edema: Secondary | ICD-10-CM | POA: Diagnosis not present

## 2020-05-08 DIAGNOSIS — M25662 Stiffness of left knee, not elsewhere classified: Secondary | ICD-10-CM

## 2020-05-08 DIAGNOSIS — G8929 Other chronic pain: Secondary | ICD-10-CM | POA: Diagnosis not present

## 2020-05-08 NOTE — Therapy (Signed)
[place], [place], [address] Phone: [phone]   Fax:  [phone]  Physical Therapy Treatment  Patient Details  Name: Leslie Davis MRN: [id number] Date of Birth: 1956-02-10 Referring Provider (PT): Erlinda Hong, MD   Encounter Date: 05/08/2020   PT End of Session - 05/08/20 1350    Visit Number 8    Number of Visits 16    Date for PT Re-Evaluation 06/09/20    Authorization Type UHC MCR    Progress Note Due on Visit 10    PT Start Time 1345    PT Stop Time 1426    PT Time Calculation (min) 41 min    Activity Tolerance Patient tolerated treatment well    Behavior During Therapy [facility] for tasks assessed/performed           Past Medical History:  Diagnosis Date  . DM (diabetes mellitus) ([place])   . Fatty liver disease, nonalcoholic   . Gastritis   . Gastroparesis   . GERD (gastroesophageal reflux disease)   . Hiatal hernia   . Hiatal hernia   . HLD (hyperlipidemia)   . HTN (hypertension)   . Renal calculus     Past Surgical History:  Procedure Laterality Date  . CHOLECYSTECTOMY    . COLONOSCOPY    . TUBAL LIGATION    . UPPER GASTROINTESTINAL ENDOSCOPY      There were no vitals filed for this visit.   Subjective Assessment - 05/08/20 1347    Subjective Patient states she is still having pain with any standing or walking.    Pertinent History PMH" Rt knee menisectomy repair >1 year ago per pt    Limitations Standing;Walking;House hold activities;Other (comment)    How long can you stand comfortably? 20 min    How long can you walk comfortably? 20 min    Diagnostic tests Knee XR 02/06/20: Right knee shows mild to moderate degenerative changes to the  Medial and patellofemoral compartments, Lt knee shows moderated medial Patellafemoral OA"    Patient Stated Goals make my knee better, try to avoid another surgery,    Pain Onset More than a month ago    Aggravating Factors  standing, walking, stairs    Pain  Relieving Factors rest/ice            [facility] Adult PT Treatment/Exercise - 05/08/20 0001      Knee/Hip Exercises: Aerobic   Nustep L5x 8 min UE/LE      Knee/Hip Exercises: Standing   Terminal Knee Extension Left;2 sets;10 reps;Theraband    Theraband Level (Terminal Knee Extension) Level 3 (Green)    Terminal Knee Extension Limitations 5 sec hold    Hip Abduction 1 set;10 reps;Both    Abduction Limitations green tband    Forward Step Up Left;1 set;10 reps;Hand Hold: 1;Step Height: 2"    Other Standing Knee Exercises sit to stand with TKE 15x    Other Standing Knee Exercises tandem stance with UE 10 sec x 4, SLS 5 sec x 10, lateral side stepping with green band around knees 3 laps x8 feet      Knee/Hip Exercises: Seated   Long Arc Quad Left;2 sets;10 reps;Weights    Long Arc Quad Weight 2 lbs.      Knee/Hip Exercises: Supine   Short Arc Quad Sets 2 sets;10 reps;Left;Other (comment)   2lbs   Bridges 10 reps    Bridges with [employer] Both;10 reps;1 set    Straight Leg Raises 2 sets;Left;10  reps    Other Supine Knee/Hip Exercises clams 2x10 green band    Other Supine Knee/Hip Exercises hamstring curl green band 12x                    PT Short Term Goals - 04/14/20 1051      PT SHORT TERM GOAL #1   Title Pt will be I and compliant with HEP.    Time 4    Period Weeks    Status New      PT SHORT TERM GOAL #2   Title Pt will improve FOTO to 66%    Time 4    Period Weeks    Status New             PT Long Term Goals - 04/29/20 1219      PT LONG TERM GOAL #1   Title Pt will improve FOTO to 70%    Time 8    Period Weeks    Status New      PT LONG TERM GOAL #2   Title Pt will improve Rt knee flexion AROM to 125 deg and improve patella hypomobility from moderate to mild.    Time 8    Period Weeks    Status Achieved      PT LONG TERM GOAL #3   Title Pt will improve Lt hip/knee strength to overall 4+ to improve function.    Time 8    Period Weeks     Status New      PT LONG TERM GOAL #4   Title Pt will report less than 3-4/10 pain with standing/walking at least 30 minutes and negotiating her 3 steps to enter her house.                 Plan - 05/08/20 1428    Clinical Impression Statement Patient seemed to be in a bit more pain than usual with many of her exercises. 2'' step still very difficult for the patient even though many of her LE strength exercises has progressed since over the course of her treatment. Continue to focus on LE weakness and work towards [place]. Reassess next visit/progress note.    Personal Factors and Comorbidities Time since onset of injury/illness/exacerbation    Examination-Activity Limitations Bend;Stairs;Stand;Lift;Squat;Locomotion Level    Examination-Participation Restrictions Community Activity;Driving;Occupation;Cleaning    Stability/Clinical Decision Making Stable/Uncomplicated    Rehab Potential Good    PT Frequency 2x / week   1-2   PT Duration 8 weeks    PT Treatment/Interventions ADLs/Self Care Home Management;Aquatic Therapy;Cryotherapy;Electrical Stimulation;Iontophoresis 4mg /ml Dexamethasone;Moist Heat;Ultrasound;Stair training;Therapeutic activities;Therapeutic exercise;Balance training;Neuromuscular re-education;Manual techniques;Passive range of motion;Dry needling;Joint Manipulations;Vasopneumatic Device;Taping    PT Next Visit Plan maintain ankle weights and hip/glute med focus bilaterally, FOTO/progress note    PT Home Exercise Plan Access Code [facility]    Consulted and Agree with Plan of Care Patient           Patient will benefit from skilled therapeutic intervention in order to improve the following deficits and impairments:  Decreased activity tolerance,Decreased balance,Decreased strength,Decreased endurance,Decreased range of motion,Increased edema,Hypomobility,Difficulty walking,Increased fascial restricitons,Increased muscle spasms,Impaired flexibility,Pain  Visit  Diagnosis: Muscle weakness (generalized)  Chronic pain of left knee  Other abnormalities of gait and mobility  Stiffness of left knee, not elsewhere classified  Localized edema     Problem List Patient Active Problem List   Diagnosis Date Noted  . AKI (acute kidney injury) ([place]) 09/07/2019  . Acute kidney injury ([place]) 09/06/2019  .  Bradycardia 09/06/2019  . Hyperglycemia 09/06/2019  . HLD (hyperlipidemia)   . Hypertensive nephropathy 05/16/2019  . Erosive gastritis 11/16/2017  . Fatty liver 11/16/2017  . Abdominal pain 12/11/2012  . Early satiety 12/11/2012  . Hiatal hernia   . Hx of cholecystectomy 02/05/2011  . Flu-like symptoms 02/05/2011  . CONSTIPATION 08/27/2008  . FATTY LIVER DISEASE 05/07/2008  . Nausea with vomiting 04/23/2008  . EPIGASTRIC PAIN 04/23/2008  . RENAL CALCULUS 04/22/2008  . Type 2 diabetes mellitus with stage 2 chronic kidney disease, with long-term current use of insulin ([place]) 05/19/2006  . Type 2 diabetes mellitus with hyperlipidemia ([place]) 05/19/2006  . HYPERTENSION, BENIGN SYSTEMIC 05/19/2006  . GASTROESOPHAGEAL REFLUX, NO ESOPHAGITIS 05/19/2006  . AMENORRHEA 05/19/2006    Glenetta Hew, SPT 05/08/2020, 2:30 PM   During this treatment session, this physical therapist was present, participating in and directing the treatment.   This note has been reviewed and this clinician agrees with the information provided.  Elsie Ra, PT, DPT 05/08/20 2:44 PM   [facility] Physical Therapy [address] [place], [place], [address] Phone: [phone]   Fax:  [phone]  Name: Leslie Davis MRN: [id number] Date of Birth: 12-23-1955

## 2020-05-09 DIAGNOSIS — M1712 Unilateral primary osteoarthritis, left knee: Secondary | ICD-10-CM | POA: Diagnosis not present

## 2020-05-13 ENCOUNTER — Ambulatory Visit (INDEPENDENT_AMBULATORY_CARE_PROVIDER_SITE_OTHER): Payer: Medicare Other | Admitting: Physical Therapy

## 2020-05-13 ENCOUNTER — Other Ambulatory Visit: Payer: Self-pay

## 2020-05-13 DIAGNOSIS — M25662 Stiffness of left knee, not elsewhere classified: Secondary | ICD-10-CM

## 2020-05-13 DIAGNOSIS — R600 Localized edema: Secondary | ICD-10-CM | POA: Diagnosis not present

## 2020-05-13 DIAGNOSIS — M25562 Pain in left knee: Secondary | ICD-10-CM

## 2020-05-13 DIAGNOSIS — M6281 Muscle weakness (generalized): Secondary | ICD-10-CM | POA: Diagnosis not present

## 2020-05-13 DIAGNOSIS — G8929 Other chronic pain: Secondary | ICD-10-CM

## 2020-05-13 DIAGNOSIS — R2689 Other abnormalities of gait and mobility: Secondary | ICD-10-CM

## 2020-05-13 NOTE — Therapy (Signed)
[place], [place], [address] Phone: [phone]   Fax:  [phone]  Physical Therapy Treatment  Patient Details  Name: Leslie Davis MRN: [id number] Date of Birth: Jul 28, 1955 Referring Provider (PT): Erlinda Hong, MD   Encounter Date: 05/13/2020   PT End of Session - 05/13/20 1038    Visit Number 9    Number of Visits 16    Date for PT Re-Evaluation 06/09/20    Authorization Type UHC MCR    Progress Note Due on Visit 10    PT Start Time 1015    PT Stop Time 1055    PT Time Calculation (min) 40 min    Activity Tolerance Patient tolerated treatment well    Behavior During Therapy [facility] for tasks assessed/performed           Past Medical History:  Diagnosis Date  . DM (diabetes mellitus) ([place])   . Fatty liver disease, nonalcoholic   . Gastritis   . Gastroparesis   . GERD (gastroesophageal reflux disease)   . Hiatal hernia   . Hiatal hernia   . HLD (hyperlipidemia)   . HTN (hypertension)   . Renal calculus     Past Surgical History:  Procedure Laterality Date  . CHOLECYSTECTOMY    . COLONOSCOPY    . TUBAL LIGATION    . UPPER GASTROINTESTINAL ENDOSCOPY      There were no vitals filed for this visit.   Subjective Assessment - 05/13/20 1029    Subjective [name] and does not know why but she has had 6-7/10 knee pain all weekend. Knee pain is anterior mostly    Pertinent History PMH" Rt knee menisectomy repair >1 year ago per pt    Limitations Standing;Walking;House hold activities;Other (comment)    How long can you stand comfortably? 20 min    How long can you walk comfortably? 20 min    Diagnostic tests Knee XR 02/06/20: Right knee shows mild to moderate degenerative changes to the  Medial and patellofemoral compartments, Lt knee shows moderate medial Patellafemoral OA"    Patient Stated Goals make my knee better, try to avoid another surgery,    Pain Onset More than  a month ago            [facility] Adult PT Treatment/Exercise - 05/13/20 0001      Neuro Re-ed    Neuro Re-ed Details  marches on foam, tandem balance on foam      Knee/Hip Exercises: Stretches   Knee: Self-Stretch Limitations seated tailgate stretch for knee flexion 10 sec X 10 reps      Knee/Hip Exercises: Aerobic   Nustep L5x 10 min UE/LE      Knee/Hip Exercises: Machines for Strengthening   Total Gym Leg Press 62# bilat push 2 sets fo 10      Knee/Hip Exercises: Standing   Heel Raises Limitations 2 sets of 15 reps heel and toe raises bilat with UE support    Hip Abduction Both;2 sets;10 reps    Abduction Limitations 5 lbs    Other Standing Knee Exercises sit to stand 10 reps      Knee/Hip Exercises: Seated   Long Arc Quad Both;2 sets;10 reps    Long Arc Quad Weight 5 lbs.    Abd/Adduction Limitations ball sq add isometrics 5 sec X 15 reps  PT Short Term Goals - 04/14/20 1051      PT SHORT TERM GOAL #1   Title Pt will be I and compliant with HEP.    Time 4    Period Weeks    Status New      PT SHORT TERM GOAL #2   Title Pt will improve FOTO to 66%    Time 4    Period Weeks    Status New             PT Long Term Goals - 04/29/20 1219      PT LONG TERM GOAL #1   Title Pt will improve FOTO to 70%    Time 8    Period Weeks    Status New      PT LONG TERM GOAL #2   Title Pt will improve Rt knee flexion AROM to 125 deg and improve patella hypomobility from moderate to mild.    Time 8    Period Weeks    Status Achieved      PT LONG TERM GOAL #3   Title Pt will improve Lt hip/knee strength to overall 4+ to improve function.    Time 8    Period Weeks    Status New      PT LONG TERM GOAL #4   Title Pt will report less than 3-4/10 pain with standing/walking at least 30 minutes and negotiating her 3 steps to enter her house.                 Plan - 05/13/20 1054    Clinical Impression Statement She has been in more knee  pain over the last week, she is suppossed be getting knee brace so hopefully this will help. She will need 10th visit progress note next visit so we will reassess her progress, goals, and measurments then.    Personal Factors and Comorbidities Time since onset of injury/illness/exacerbation    Examination-Activity Limitations Bend;Stairs;Stand;Lift;Squat;Locomotion Level    Examination-Participation Restrictions Community Activity;Driving;Occupation;Cleaning    Stability/Clinical Decision Making Stable/Uncomplicated    Rehab Potential Good    PT Frequency 2x / week   1-2   PT Duration 8 weeks    PT Treatment/Interventions ADLs/Self Care Home Management;Aquatic Therapy;Cryotherapy;Electrical Stimulation;Iontophoresis 4mg /ml Dexamethasone;Moist Heat;Ultrasound;Stair training;Therapeutic activities;Therapeutic exercise;Balance training;Neuromuscular re-education;Manual techniques;Passive range of motion;Dry needling;Joint Manipulations;Vasopneumatic Device;Taping    PT Next Visit Plan FOTO/progress note    PT Home Exercise Plan Access Code [facility]    Consulted and Agree with Plan of Care Patient           Patient will benefit from skilled therapeutic intervention in order to improve the following deficits and impairments:  Decreased activity tolerance,Decreased balance,Decreased strength,Decreased endurance,Decreased range of motion,Increased edema,Hypomobility,Difficulty walking,Increased fascial restricitons,Increased muscle spasms,Impaired flexibility,Pain  Visit Diagnosis: Muscle weakness (generalized)  Chronic pain of left knee  Other abnormalities of gait and mobility  Stiffness of left knee, not elsewhere classified  Localized edema     Problem List Patient Active Problem List   Diagnosis Date Noted  . AKI (acute kidney injury) ([place]) 09/07/2019  . Acute kidney injury ([place]) 09/06/2019  . Bradycardia 09/06/2019  . Hyperglycemia 09/06/2019  . HLD (hyperlipidemia)   .  Hypertensive nephropathy 05/16/2019  . Erosive gastritis 11/16/2017  . Fatty liver 11/16/2017  . Abdominal pain 12/11/2012  . Early satiety 12/11/2012  . Hiatal hernia   . Hx of cholecystectomy 02/05/2011  . Flu-like symptoms 02/05/2011  . CONSTIPATION 08/27/2008  . FATTY LIVER DISEASE 05/07/2008  .  Nausea with vomiting 04/23/2008  . EPIGASTRIC PAIN 04/23/2008  . RENAL CALCULUS 04/22/2008  . Type 2 diabetes mellitus with stage 2 chronic kidney disease, with long-term current use of insulin ([place]) 05/19/2006  . Type 2 diabetes mellitus with hyperlipidemia ([place]) 05/19/2006  . HYPERTENSION, BENIGN SYSTEMIC 05/19/2006  . GASTROESOPHAGEAL REFLUX, NO ESOPHAGITIS 05/19/2006  . AMENORRHEA 05/19/2006    Debbe Odea, PT,DPT 05/13/2020, 10:55 AM  [facility] Physical Therapy [address] [place], [place], [address] Phone: [phone]   Fax:  [phone]  Name: Leslie Davis MRN: [id number] Date of Birth: 1956/01/29

## 2020-05-15 ENCOUNTER — Other Ambulatory Visit: Payer: Self-pay

## 2020-05-15 ENCOUNTER — Ambulatory Visit (INDEPENDENT_AMBULATORY_CARE_PROVIDER_SITE_OTHER): Payer: Medicare Other | Admitting: Physical Therapy

## 2020-05-15 DIAGNOSIS — M25562 Pain in left knee: Secondary | ICD-10-CM | POA: Diagnosis not present

## 2020-05-15 DIAGNOSIS — R600 Localized edema: Secondary | ICD-10-CM

## 2020-05-15 DIAGNOSIS — G8929 Other chronic pain: Secondary | ICD-10-CM

## 2020-05-15 DIAGNOSIS — R2689 Other abnormalities of gait and mobility: Secondary | ICD-10-CM

## 2020-05-15 DIAGNOSIS — M25662 Stiffness of left knee, not elsewhere classified: Secondary | ICD-10-CM

## 2020-05-15 DIAGNOSIS — M6281 Muscle weakness (generalized): Secondary | ICD-10-CM

## 2020-05-15 NOTE — Therapy (Signed)
[facility] Physical Therapy [address] [place], [place], [address] Phone: [phone]   Fax:  [phone]  Physical Therapy Treatment/Progress note Progress Note reporting period 04/14/20 to 05/15/20  See below for objective and subjective measurements relating to patients progress with PT.   Patient Details  Name: Leslie Davis MRN: [id number] Date of Birth: 09-09-1955 Referring Provider (PT): Erlinda Hong, MD   Encounter Date: 05/15/2020   PT End of Session - 05/15/20 1156    Visit Number 10    Number of Visits 16    Date for PT Re-Evaluation 06/09/20    Authorization Type UHC MCR    Progress Note Due on Visit 16    PT Start Time 0930    PT Stop Time 1015    PT Time Calculation (min) 45 min    Activity Tolerance Patient tolerated treatment well    Behavior During Therapy [facility] for tasks assessed/performed           Past Medical History:  Diagnosis Date  . DM (diabetes mellitus) ([place])   . Fatty liver disease, nonalcoholic   . Gastritis   . Gastroparesis   . GERD (gastroesophageal reflux disease)   . Hiatal hernia   . Hiatal hernia   . HLD (hyperlipidemia)   . HTN (hypertension)   . Renal calculus     Past Surgical History:  Procedure Laterality Date  . CHOLECYSTECTOMY    . COLONOSCOPY    . TUBAL LIGATION    . UPPER GASTROINTESTINAL ENDOSCOPY      There were no vitals filed for this visit.   Subjective Assessment - 05/15/20 0934    Subjective Patient is feeling much better from her extreme pain over the weekend. She recieved her knee brace this morning.    Pertinent History PMH" Rt knee menisectomy repair >1 year ago per pt    Limitations Standing;Walking;House hold activities;Other (comment)    How long can you stand comfortably? 20 min    How long can you walk comfortably? 20 min    Diagnostic tests Knee XR 02/06/20: Right knee shows mild to moderate degenerative changes to the  Medial and patellofemoral compartments, Lt knee shows  moderate medial Patellafemoral OA"    Patient Stated Goals make my knee better, try to avoid another surgery,    Currently in Pain? Yes    Pain Score 6     Pain Location Knee    Pain Orientation Left    Pain Descriptors / Indicators Aching    Pain Type Chronic pain    Pain Onset More than a month ago              [facility] PT Assessment - 05/15/20 0001      Observation/Other Assessments   Focus on Therapeutic Outcomes (FOTO)  55%      Strength   Right Hip Flexion 5/5    Right Hip ABduction 5/5    Right Hip ADduction 4/5    Left Hip Flexion 4/5    Right Knee Flexion 5/5    Right Knee Extension 5/5    Left Knee Flexion 4/5    Left Knee Extension 4-/5                         [facility] Adult PT Treatment/Exercise - 05/15/20 0001      Knee/Hip Exercises: Aerobic   Nustep L5x 10 min UE/LE      Knee/Hip Exercises: Standing   Forward Step Up Left;1 set;10  reps;Hand Hold: 1;Step Height: 4"    Step Down 5 reps;Left;Hand Hold: 1;Step Height: 2";Step Height: 4"    Other Standing Knee Exercises sit to stand 15 reps without UE and Red band TKE      Knee/Hip Exercises: Seated   Long Arc Quad Both;2 sets;10 reps    Long Arc Quad Weight 5 lbs.    Marching Both;1 set;10 reps;Weights    Abd/Adduction Limitations ball sq add isometrics 5 sec X 15 reps                    PT Short Term Goals - 04/14/20 1051      PT SHORT TERM GOAL #1   Title Pt will be I and compliant with HEP.    Time 4    Period Weeks    Status New      PT SHORT TERM GOAL #2   Title Pt will improve FOTO to 66%    Time 4    Period Weeks    Status New             PT Long Term Goals - 05/15/20 1213      PT LONG TERM GOAL #1   Title Pt will improve FOTO to 70%    Baseline 63 starting, decreased to 55 on 2/24    Time 8    Period Weeks    Status On-going      PT LONG TERM GOAL #2   Title Pt will improve Rt knee flexion AROM to 125 deg and improve patella hypomobility from moderate  to mild.    Time 8    Period Weeks    Status Achieved      PT LONG TERM GOAL #3   Title Pt will improve Lt hip/knee strength to overall 4+ to improve function.    Baseline Many measurements improved, still lacking full knee strength and hip flexion strength    Time 8    Period Weeks    Status Partially Met      PT LONG TERM GOAL #4   Title Pt will report less than 3-4/10 pain with standing/walking at least 30 minutes and negotiating her 3 steps to enter her house.    Baseline still having 8/10 pain with walking 20 minutes and stairs are getting easier but till not 100%    Status On-going                 Plan - 05/15/20 1157    Clinical Impression Statement Updated patient progress today, she has achieved ROM goals for knee motion, and has improved in some of her LE strength measurements. She still demonstrates deficits specifically with knee extension and hip flexion. She still has difficulty with her stairs and walking is painful, but she feels as though she has gotten some improvement from coming to PT and would like to continue. Now that she has recieved her brace, continue LE strengthening for improved function to make progress toward meeting the rest of her LTGs.    Personal Factors and Comorbidities Time since onset of injury/illness/exacerbation    Examination-Activity Limitations Bend;Stairs;Stand;Lift;Squat;Locomotion Level    Examination-Participation Restrictions Community Activity;Driving;Occupation;Cleaning    Stability/Clinical Decision Making Stable/Uncomplicated    Rehab Potential Good    PT Frequency 2x / week   1-2   PT Duration 8 weeks    PT Treatment/Interventions ADLs/Self Care Home Management;Aquatic Therapy;Cryotherapy;Electrical Stimulation;Iontophoresis 4mg /ml Dexamethasone;Moist Heat;Ultrasound;Stair training;Therapeutic activities;Therapeutic exercise;Balance training;Neuromuscular re-education;Manual techniques;Passive range of motion;Dry needling;Joint  Manipulations;Vasopneumatic Device;Taping    PT Next Visit Plan SL leg press/leg ext    PT Home Exercise Plan Access Code [facility]    Consulted and Agree with Plan of Care Patient           Patient will benefit from skilled therapeutic intervention in order to improve the following deficits and impairments:  Decreased activity tolerance,Decreased balance,Decreased strength,Decreased endurance,Decreased range of motion,Increased edema,Hypomobility,Difficulty walking,Increased fascial restricitons,Increased muscle spasms,Impaired flexibility,Pain  Visit Diagnosis: Muscle weakness (generalized)  Chronic pain of left knee  Other abnormalities of gait and mobility  Stiffness of left knee, not elsewhere classified  Localized edema     Problem List Patient Active Problem List   Diagnosis Date Noted  . AKI (acute kidney injury) ([place]) 09/07/2019  . Acute kidney injury ([place]) 09/06/2019  . Bradycardia 09/06/2019  . Hyperglycemia 09/06/2019  . HLD (hyperlipidemia)   . Hypertensive nephropathy 05/16/2019  . Erosive gastritis 11/16/2017  . Fatty liver 11/16/2017  . Abdominal pain 12/11/2012  . Early satiety 12/11/2012  . Hiatal hernia   . Hx of cholecystectomy 02/05/2011  . Flu-like symptoms 02/05/2011  . CONSTIPATION 08/27/2008  . FATTY LIVER DISEASE 05/07/2008  . Nausea with vomiting 04/23/2008  . EPIGASTRIC PAIN 04/23/2008  . RENAL CALCULUS 04/22/2008  . Type 2 diabetes mellitus with stage 2 chronic kidney disease, with long-term current use of insulin ([place]) 05/19/2006  . Type 2 diabetes mellitus with hyperlipidemia ([place]) 05/19/2006  . HYPERTENSION, BENIGN SYSTEMIC 05/19/2006  . GASTROESOPHAGEAL REFLUX, NO ESOPHAGITIS 05/19/2006  . AMENORRHEA 05/19/2006    Glenetta Hew, SPT 05/15/2020, 12:15 PM   During this treatment session, this physical therapist was present, participating in and directing the treatment.   This note has been reviewed and this clinician agrees with  the information provided.  Elsie Ra, PT, DPT 05/15/20 2:03 PM   [facility] Physical Therapy [address] [place], [place], [address] Phone: [phone]   Fax:  [phone]  Name: ABILENE MCPHEE MRN: [id number] Date of Birth: 06-06-1955

## 2020-05-27 ENCOUNTER — Other Ambulatory Visit: Payer: Self-pay | Admitting: Orthopaedic Surgery

## 2020-05-27 NOTE — Telephone Encounter (Signed)
If yes, please send into pharm. Thanks.  

## 2020-05-28 ENCOUNTER — Ambulatory Visit (INDEPENDENT_AMBULATORY_CARE_PROVIDER_SITE_OTHER): Payer: Medicare Other | Admitting: Physical Therapy

## 2020-05-28 ENCOUNTER — Ambulatory Visit: Payer: Self-pay

## 2020-05-28 ENCOUNTER — Other Ambulatory Visit: Payer: Self-pay

## 2020-05-28 ENCOUNTER — Ambulatory Visit (INDEPENDENT_AMBULATORY_CARE_PROVIDER_SITE_OTHER): Payer: Medicare Other | Admitting: Orthopaedic Surgery

## 2020-05-28 VITALS — Ht 64.5 in | Wt 216.0 lb

## 2020-05-28 DIAGNOSIS — R600 Localized edema: Secondary | ICD-10-CM | POA: Diagnosis not present

## 2020-05-28 DIAGNOSIS — M25662 Stiffness of left knee, not elsewhere classified: Secondary | ICD-10-CM

## 2020-05-28 DIAGNOSIS — G8929 Other chronic pain: Secondary | ICD-10-CM

## 2020-05-28 DIAGNOSIS — M1712 Unilateral primary osteoarthritis, left knee: Secondary | ICD-10-CM

## 2020-05-28 DIAGNOSIS — R2689 Other abnormalities of gait and mobility: Secondary | ICD-10-CM | POA: Diagnosis not present

## 2020-05-28 DIAGNOSIS — M6281 Muscle weakness (generalized): Secondary | ICD-10-CM | POA: Diagnosis not present

## 2020-05-28 DIAGNOSIS — M25562 Pain in left knee: Secondary | ICD-10-CM | POA: Diagnosis not present

## 2020-05-28 DIAGNOSIS — E1122 Type 2 diabetes mellitus with diabetic chronic kidney disease: Secondary | ICD-10-CM | POA: Diagnosis not present

## 2020-05-28 NOTE — Therapy (Signed)
[facility] Physical Therapy [address] [place], [place], [address] Phone: [phone]   Fax:  [phone]  Physical Therapy Treatment/Discharge PHYSICAL THERAPY DISCHARGE SUMMARY  Visits from Start of Care: 11  Current functional level related to goals / functional outcomes: See below   Remaining deficits: See below   Education / Equipment: HEP, recommendation to return to MD Plan: Patient agrees to discharge.  Patient goals were not met. Patient is being discharged due to lack of progress.  ?????       Patient Details  Name: Leslie Davis MRN: [id number] Date of Birth: 18-Feb-1956 Referring Provider (PT): Erlinda Hong, MD   Encounter Date: 05/28/2020   PT End of Session - 05/28/20 0928    Visit Number 11    Number of Visits 16    Date for PT Re-Evaluation 06/09/20    Authorization Type UHC MCR    Progress Note Due on Visit 16    PT Start Time 0850    PT Stop Time 0915    PT Time Calculation (min) 25 min    Activity Tolerance Patient tolerated treatment well    Behavior During Therapy [facility] for tasks assessed/performed           Past Medical History:  Diagnosis Date  . DM (diabetes mellitus) ([place])   . Fatty liver disease, nonalcoholic   . Gastritis   . Gastroparesis   . GERD (gastroesophageal reflux disease)   . Hiatal hernia   . Hiatal hernia   . HLD (hyperlipidemia)   . HTN (hypertension)   . Renal calculus     Past Surgical History:  Procedure Laterality Date  . CHOLECYSTECTOMY    . COLONOSCOPY    . TUBAL LIGATION    . UPPER GASTROINTESTINAL ENDOSCOPY      There were no vitals filed for this visit.   Subjective Assessment - 05/28/20 0926    Subjective she relays her knee is doing awful and is not really improving. The exercises make it feel better temporarily then the pain comes back. She says the knee brace is not helping any and wont stay up anyway.    Pertinent History PMH" Rt knee menisectomy repair >1 year ago per pt     Limitations Standing;Walking;House hold activities;Other (comment)    How long can you stand comfortably? 20 min    How long can you walk comfortably? 20 min    Diagnostic tests Knee XR 02/06/20: Right knee shows mild to moderate degenerative changes to the  Medial and patellofemoral compartments, Lt knee shows moderate medial Patellafemoral OA"    Patient Stated Goals make my knee better, try to avoid another surgery,    Pain Onset More than a month ago              [facility] PT Assessment - 05/28/20 0001      Assessment   Medical Diagnosis Chronic Lt knee pain/OA    Referring Provider (PT) Erlinda Hong, MD      Observation/Other Assessments   Focus on Therapeutic Outcomes (FOTO)  55%      PROM   Left Knee Extension 0    Left Knee Flexion 131      Strength   Left Knee Flexion 4/5    Left Knee Extension 4/5                         [facility] Adult PT Treatment/Exercise - 05/28/20 0001      Self-Care   Self-Care  Other Self-Care Comments    Other Self-Care Comments  reviewed how to don her knee brace however it does not stay up very well, recommended she call the company back to see if adjustments need to be made, discussed plan of care to discontinue PT and refer her back to MD      Knee/Hip Exercises: Aerobic   Recumbent Bike L1 X 8 min                    PT Short Term Goals - 05/28/20 1194      PT SHORT TERM GOAL #1   Title Pt will be I and compliant with HEP.    Time 4    Period Weeks    Status Achieved      PT SHORT TERM GOAL #2   Title Pt will improve FOTO to 66%    Baseline decreased to 55%    Time 4    Period Weeks    Status Not Met             PT Long Term Goals - 05/28/20 1740      PT LONG TERM GOAL #1   Title Pt will improve FOTO to 70%    Baseline 63 starting, decreased to 55 on 2/24    Time 8    Period Weeks    Status Not Met      PT LONG TERM GOAL #2   Title Pt will improve Rt knee flexion AROM to 125 deg and improve patella  hypomobility from moderate to mild.    Time 8    Period Weeks    Status Achieved      PT LONG TERM GOAL #3   Title Pt will improve Lt hip/knee strength to overall 4+ to improve function.    Baseline Many measurements improved, still lacking full knee strength and hip flexion strength    Time 8    Period Weeks    Status Partially Met      PT LONG TERM GOAL #4   Title Pt will report less than 3-4/10 pain with standing/walking at least 30 minutes and negotiating her 3 steps to enter her house.    Baseline still having 8/10 pain with walking 20 minutes and stairs are getting easier but till not 100%    Status Not Met                 Plan - 05/28/20 0930    Clinical Impression Statement Even though she made some progress with knee ROM and strenght she has not improved her knee pain significantly and her knee brace is not helping either. After long discussion today PT feels that she should return to see MD as we are not able to help her any more with her pain. We will discharge today and she is in agreement with this plan and will set up follow up appointment to see MD.    Personal Factors and Comorbidities Time since onset of injury/illness/exacerbation    Examination-Activity Limitations Bend;Stairs;Stand;Lift;Squat;Locomotion Level    Examination-Participation Restrictions Community Activity;Driving;Occupation;Cleaning    Stability/Clinical Decision Making Stable/Uncomplicated    Rehab Potential Good    PT Frequency 2x / week   1-2   PT Duration 8 weeks    PT Treatment/Interventions ADLs/Self Care Home Management;Aquatic Therapy;Cryotherapy;Electrical Stimulation;Iontophoresis 4mg /ml Dexamethasone;Moist Heat;Ultrasound;Stair training;Therapeutic activities;Therapeutic exercise;Balance training;Neuromuscular re-education;Manual techniques;Passive range of motion;Dry needling;Joint Manipulations;Vasopneumatic Device;Taping    PT Next Visit Plan DC today    PT Home  Exercise Plan Access  Code [facility]    Consulted and Agree with Plan of Care Patient           Patient will benefit from skilled therapeutic intervention in order to improve the following deficits and impairments:  Decreased activity tolerance,Decreased balance,Decreased strength,Decreased endurance,Decreased range of motion,Increased edema,Hypomobility,Difficulty walking,Increased fascial restricitons,Increased muscle spasms,Impaired flexibility,Pain  Visit Diagnosis: Muscle weakness (generalized)  Chronic pain of left knee  Other abnormalities of gait and mobility  Stiffness of left knee, not elsewhere classified  Localized edema     Problem List Patient Active Problem List   Diagnosis Date Noted  . AKI (acute kidney injury) ([place]) 09/07/2019  . Acute kidney injury ([place]) 09/06/2019  . Bradycardia 09/06/2019  . Hyperglycemia 09/06/2019  . HLD (hyperlipidemia)   . Hypertensive nephropathy 05/16/2019  . Erosive gastritis 11/16/2017  . Fatty liver 11/16/2017  . Abdominal pain 12/11/2012  . Early satiety 12/11/2012  . Hiatal hernia   . Hx of cholecystectomy 02/05/2011  . Flu-like symptoms 02/05/2011  . CONSTIPATION 08/27/2008  . FATTY LIVER DISEASE 05/07/2008  . Nausea with vomiting 04/23/2008  . EPIGASTRIC PAIN 04/23/2008  . RENAL CALCULUS 04/22/2008  . Type 2 diabetes mellitus with stage 2 chronic kidney disease, with long-term current use of insulin ([place]) 05/19/2006  . Type 2 diabetes mellitus with hyperlipidemia ([place]) 05/19/2006  . HYPERTENSION, BENIGN SYSTEMIC 05/19/2006  . GASTROESOPHAGEAL REFLUX, NO ESOPHAGITIS 05/19/2006  . AMENORRHEA 05/19/2006    Leslie Davis 05/28/2020, 9:41 AM  [facility] Physical Therapy [address] [place], [place], [address] Phone: [phone]   Fax:  [phone]  Name: Leslie Davis MRN: [id number] Date of Birth: 1955/05/11

## 2020-05-28 NOTE — Progress Notes (Signed)
Office Visit Note   Patient: Leslie Davis           Date of Birth: 28-Jun-1955           MRN: [id number] Visit Date: 05/28/2020              Requested by: [name], [address] [place],  [place] [address] PCP: [name], MD   Assessment & Plan: Visit Diagnoses:  1. Primary osteoarthritis of left knee     Plan: Impression is end-stage left knee DJD.  Treatment options again reviewed and discussed with the patient in detail and their associated risks and benefits.  Based on our discussion she has elected to proceed with a left total knee replacement in the near future pending preoperative clearance and A1c.  We will follow up with the patient once we have the necessary information.  Follow-Up Instructions: Return if symptoms worsen or fail to improve.   Orders:  Orders Placed This Encounter  Procedures  . Prealbumin  . HgB A1c   No orders of the defined types were placed in this encounter.     Procedures: No procedures performed   Clinical Data: No additional findings.   Subjective: Chief Complaint  Patient presents with  . Left Knee - Pain    Ms. Stavros comes in today for chronic left knee pain.  She has severe pain all the time and it is getting worse.  She is unable to perform ADLs.  Medications and physical therapy and weight loss and cortisone injections have not given her any significant relief.   Review of Systems  Constitutional: Negative.   HENT: Negative.   Eyes: Negative.   Respiratory: Negative.   Cardiovascular: Negative.   Endocrine: Negative.   Musculoskeletal: Negative.   Neurological: Negative.   Hematological: Negative.   Psychiatric/Behavioral: Negative.   All other systems reviewed and are negative.    Objective: Vital Signs: Ht 5' 4.5" (1.638 m)   Wt 216 lb (98 kg)   BMI 36.50 kg/m   Physical Exam Vitals and nursing note reviewed.  Constitutional:      Appearance: She is well-developed and  well-nourished.  Pulmonary:     Effort: Pulmonary effort is normal.  Skin:    General: Skin is warm.     Capillary Refill: Capillary refill takes less than 2 seconds.  Neurological:     Mental Status: She is alert and oriented to person, place, and time.  Psychiatric:        Mood and Affect: Mood and affect normal.        Behavior: Behavior normal.        Thought Content: Thought content normal.        Judgment: Judgment normal.     Ortho Exam Left knee shows trace effusion.  2+ patellofemoral crepitus.  Severe pain with range of motion.  Mild restriction range of motion.  Collaterals and cruciates are stable. Specialty Comments:  No specialty comments available.  Imaging: No results found.   PMFS History: Patient Active Problem List   Diagnosis Date Noted  . AKI (acute kidney injury) ([facility]) 09/07/2019  . Acute kidney injury ([facility]) 09/06/2019  . Bradycardia 09/06/2019  . Hyperglycemia 09/06/2019  . HLD (hyperlipidemia)   . Hypertensive nephropathy 05/16/2019  . Erosive gastritis 11/16/2017  . Fatty liver 11/16/2017  . Abdominal pain 12/11/2012  . Early satiety 12/11/2012  . Hiatal hernia   . Hx of cholecystectomy 02/05/2011  . Flu-like symptoms 02/05/2011  .  CONSTIPATION 08/27/2008  . FATTY LIVER DISEASE 05/07/2008  . Nausea with vomiting 04/23/2008  . EPIGASTRIC PAIN 04/23/2008  . RENAL CALCULUS 04/22/2008  . Type 2 diabetes mellitus with stage 2 chronic kidney disease, with long-term current use of insulin ([facility]) 05/19/2006  . Type 2 diabetes mellitus with hyperlipidemia ([facility]) 05/19/2006  . HYPERTENSION, BENIGN SYSTEMIC 05/19/2006  . GASTROESOPHAGEAL REFLUX, NO ESOPHAGITIS 05/19/2006  . AMENORRHEA 05/19/2006   Past Medical History:  Diagnosis Date  . DM (diabetes mellitus) ([facility])   . Fatty liver disease, nonalcoholic   . Gastritis   . Gastroparesis   . GERD (gastroesophageal reflux disease)   . Hiatal hernia   . Hiatal hernia   . HLD (hyperlipidemia)   .  HTN (hypertension)   . Renal calculus     Family History  Problem Relation Age of Onset  . Diabetes Mother   . Diabetes Maternal Grandmother   . Stomach cancer Maternal Aunt        X 2 aunts  . Hypertension Father   . Colon cancer Neg Hx   . Esophageal cancer Neg Hx   . Rectal cancer Neg Hx     Past Surgical History:  Procedure Laterality Date  . CHOLECYSTECTOMY    . COLONOSCOPY    . TUBAL LIGATION    . UPPER GASTROINTESTINAL ENDOSCOPY     Social History   Occupational History  . Occupation: housekeeping  Tobacco Use  . Smoking status: Former Smoker    Packs/day: 0.25    Years: 1.00    Pack years: 0.25    Quit date: 09/10/1974    Years since quitting: 45.7  . Smokeless tobacco: Never Used  . Tobacco comment: she no longer smokes.   Vaping Use  . Vaping Use: Never used  Substance and Sexual Activity  . Alcohol use: No  . Drug use: No  . Sexual activity: Not Currently

## 2020-05-29 ENCOUNTER — Telehealth: Payer: Self-pay

## 2020-05-29 LAB — HEMOGLOBIN A1C
Hgb A1c MFr Bld: 8.7 % of total Hgb — ABNORMAL HIGH (ref <5.7)
Mean Plasma Glucose: 203 mg/dL
eAG (mmol/L): 11.2 mmol/L

## 2020-05-29 LAB — PREALBUMIN: Prealbumin: 25 mg/dL (ref 17–34)

## 2020-05-29 NOTE — Progress Notes (Signed)
Hi [name], Our mutual patient, Leslie Davis, came back to see me yesterday to schedule a knee replacement but unfortunately, her A1C continues to be too high for surgery.  I told her that it needs to be less than 7.8 for [place] to proceed with the surgery.  I've asked her to reach out to you about getting it under better control.  Please let me know if you have any questions.  Take care. Thank you.

## 2020-05-29 NOTE — Telephone Encounter (Signed)
Patient called she is requesting rx to be sent to the pharmacy for pain meds call back:[phone]

## 2020-05-30 ENCOUNTER — Encounter: Payer: Medicare Other | Admitting: Physical Therapy

## 2020-05-30 ENCOUNTER — Other Ambulatory Visit: Payer: Self-pay | Admitting: Physician Assistant

## 2020-05-30 ENCOUNTER — Telehealth: Payer: Self-pay

## 2020-05-30 NOTE — Telephone Encounter (Signed)
Called patient no answer LMOM to return call. Need to know which Rx she would like. See message below.

## 2020-05-30 NOTE — Telephone Encounter (Signed)
She needs to come in to discuss diabetes. Needs to get under control in order to have knee surgery.

## 2020-05-30 NOTE — Chronic Care Management (AMB) (Signed)
Chronic Care Management Pharmacy Assistant   Name: Leslie Davis  MRN: [id number] DOB: 04-12-1955   Reason for Encounter: Medication Review    Recent office visits:  None  Recent consult visits:  05/06/2020, 05/08/2020, 05/13/2020,05/15/2020, 05/28/2020 Elsie Ra( Outpatient Rehab) 05/28/2020 Frankey Shown MD(Orthopedics)   Hospital visits:  None in previous 6 months   Reviewed chart for medication changes ahead of medication coordination call.  Medication changes indicated: traMADol (ULTRAM) 50 MG tablet directions changed from every 12 hours prn to every 6 hours prn.   BP Readings from Last 3 Encounters:  02/21/20 (!) 152/68  11/15/19 128/78  11/15/19 128/78    Lab Results  Component Value Date   HGBA1C 8.7 (H) 05/28/2020     Patient obtains medications through Vials  90 Days   Last adherence delivery included:  Toujeo-50 unitssq nightly Temazepam 30 mg- 1 capsule at bedtime as needed.  Ozempic 2/1.5 ml- Inject 0.25 mg sq once a week Vitamin D 50000 units-1 capsule Tuesday and Friday    Patient declined the following medications last month:  Amlodipine 10 mg-1 tablet daily Rosuvastatin20 mg-1 tablet daily Cetirizine 10 mg-1 tablet daily ASA 81 mg-1 tablet daily Edarbi 80 mg-1 tablet daily Hydrochlorothiazide 12.5 mg- 1 tablet daily Tramadol 50 mg- 1 tablet by mouth three times daily as needed Pantoprazole 40 mg- 1 tablet three times a week. Onetouch Verio Test Strips- Use to check blood sugars twice daily Trueplus Pen 31g 16mm- Use with pen to inject insulin daily.  Due to receiving a 90 day supply on 03/07/2020  Clindamycin lotion 1%- apply to skin in the morning dailydue to PRN use received a supply on 01/09/2020. Ferrous Gluconate 324 mg-1 tablet twice daily due to receiving a 90 day supply on 01/09/2020. Flonase NS 1-2 sprayseach nostril once daily.Due to PRN use received a 90 day supply on 10/20/201. Humalog Kwikpen 100  units/ml-Inject as directed per sliding scaledue to having an adequate supply. Magnesium Oxide 400 mg- 1 tablet every evening due to having a #30 on hand. Patient states she used all of the 200 mg tablets that were sent a month ago, she took 2 a day to get the 400 mg dosage.  Patient is due for next adherence delivery on: 06/06/2020. Called patient and reviewed medications and coordinated delivery.  This delivery to include: . Amlodipine 10 mg -1 tablet daily . Rosuvastatin 20 mg- 1 tablet daily . ASA 81 mg- 1 tablet daily . Edarbi 80 mg- 1 tablet daily  . Ozempic 2/1.5 ml- Inject 0.25 mg sq once a week . Onetouch Verio Test Strips- Use to check blood sugars twice daily . Trueplus Pen 31g 86mm- Use with pen to inject insulin daily.  No short fill or acute fill needed.  Patient declined the following medications: . Toujeo -50 units sq nightly due to receiving an 81-day supply on 05/05/2020  . Temazepam 30 mg- 1 capsule at bedtime as needed (64-day supply) . Vitamin D 50000 units -1 capsule Tuesday and Friday (84-day supply)   due to receiving a supply on 05/07/2020  . Cetirizine 10 mg- 1 tablet daily . Pantoprazole 40 mg- 1 tablet three times a week.  Due to receiving a 90 day supply on 05/27/2020  . Hydrochlorothiazide 12.5 mg- 1 tablet daily due to being discontinued  . Tramadol 50 mg- 1 tablet by mouth three times daily as needed DUE PRN use . Clindamycin lotion 1%- apply to skin in the morning daily due to PRN use received  a supply on 01/09/2020. Marland Kitchen Ferrous Gluconate 324 mg- 1 tablet twice daily due to receiving a 90-day supply on 01/09/2020- Patient has OTC left.  Patient needs refills for Ozempic- Request sent to PCP .  Medication delivery date changed to 06/10/2020, advised patient that pharmacy will contact them the morning of delivery.   Medications: Outpatient Encounter Medications as of 05/30/2020  Medication Sig  . amLODipine (NORVASC) 10 MG tablet Take 1 tablet (10 mg  total) by mouth daily.  Marland Kitchen aspirin EC 81 MG tablet Take 1 tablet (81 mg total) by mouth daily.  . Azilsartan Medoxomil (EDARBI) 80 MG TABS TAKE 1 TABLET EACH DAY.  . benzonatate (TESSALON PERLES) 100 MG capsule Take 1 capsule (100 mg total) by mouth every 6 (six) hours as needed for cough.  . Blood Glucose Monitoring Suppl (ONETOUCH VERIO) w/Device KIT Use as directed to check blood sugars 2 times per day dx: e11.65  . cetirizine (ZYRTEC) 10 MG tablet Take 1 tablet (10 mg total) by mouth daily.  . Cholecalciferol (VITAMIN D3) 1.25 MG (50000 UT) CAPS Take 1 capsule by mouth on Tuesday and Friday  . Continuous Blood Gluc Receiver (FREESTYLE LIBRE 14 DAY READER) DEVI by Does not apply route.  . Continuous Blood Gluc Sensor (FREESTYLE LIBRE 14 DAY SENSOR) MISC Use as directed to check blood sugars 4 times per day dx: e11.65  . ferrous gluconate (FERGON) 324 MG tablet TAKE 1 TABLET BY MOUTH TWICE DAILY WITH A MEAL.  Marland Kitchen glucose blood (ONETOUCH VERIO) test strip Use as instructed to check blood sugars 2 times per day dx:e11.65  . insulin glargine, 1 Unit Dial, (TOUJEO SOLOSTAR) 300 UNIT/ML Solostar Pen Inject 50 units subcutaneous at bedtime  . insulin lispro (HUMALOG KWIKPEN) 100 UNIT/ML KwikPen Sliding scale  . Insulin Pen Needle 32G X 6 MM MISC Use with pen injectors daily dx code: e11.9  . Magnesium 400 MG TABS One tab po qpm  . OneTouch Delica Lancets 01U MISC Use as directed to check blood sugars 2 times per day dx:e11.65  . pantoprazole (PROTONIX) 40 MG tablet Take one tablet by mouth 3 days a week  . rosuvastatin (CRESTOR) 20 MG tablet Take 1 tablet (20 mg total) by mouth daily.  . Semaglutide,0.25 or 0.5MG /DOS, (OZEMPIC, 0.25 OR 0.5 MG/DOSE,) 2 MG/1.5ML SOPN Inject 0.25 mg into the skin once a week.  . temazepam (RESTORIL) 30 MG capsule One capsules po qhs prn  . traMADol (ULTRAM) 50 MG tablet TAKE ONE TABLET BY MOUTH EVERY 6 HOURS AS NEEDED   No facility-administered encounter medications on  file as of 05/30/2020.    Star Rating Drugs:  Edarbi 80 mg- Last filled 03/07/2020 for 90 day supply at [facility]. Toujeo Solostar- Last filled 05/05/2020 for 81 day supply at [facility]. Humalog Kwikpen- Last filled 12/13/2019 for 60 day supply at [facility] 15/850 mg- Last filled 12/25/2017 for 90 day supply at [facility]. Rosuvastatin 20 mg- Last filled 03/07/2020 for 90 day supply at [facility]. Ozempic 0.25mg - Last filled 05/08/2020 for 42 day supply at [facility]. Metformin 850 mg- Last filled 06/16/2019, for 90 day supply at [facility].    Note: Patient checks her blood sugars daily, 107- fasting, ranges from 113 - 121 - 97. Patient also checks her blood pressures daily, recent readings 133/51, 133/51, 123/60, 130/56. Dispensing form uploaded to [facility] on 06/10/2020 for review. [name], CPP notified.   SIG: Pattricia Boss, [facility] Pharmacist Assistant [phone] (New CPA shadowed  first part of medication coordination process)

## 2020-05-30 NOTE — Telephone Encounter (Signed)
I am happy to call in tramadol if no hx of seizures or tylenol 3.  Just let me know

## 2020-06-03 ENCOUNTER — Encounter: Payer: Medicare Other | Admitting: Physical Therapy

## 2020-06-03 ENCOUNTER — Encounter: Payer: Self-pay | Admitting: Internal Medicine

## 2020-06-03 ENCOUNTER — Other Ambulatory Visit: Payer: Self-pay

## 2020-06-03 ENCOUNTER — Ambulatory Visit (INDEPENDENT_AMBULATORY_CARE_PROVIDER_SITE_OTHER): Payer: Medicare Other | Admitting: Internal Medicine

## 2020-06-03 VITALS — BP 154/70 | HR 64 | Temp 98.2°F | Ht 64.5 in | Wt 214.2 lb

## 2020-06-03 DIAGNOSIS — Z794 Long term (current) use of insulin: Secondary | ICD-10-CM | POA: Diagnosis not present

## 2020-06-03 DIAGNOSIS — E1122 Type 2 diabetes mellitus with diabetic chronic kidney disease: Secondary | ICD-10-CM | POA: Diagnosis not present

## 2020-06-03 DIAGNOSIS — I10 Essential (primary) hypertension: Secondary | ICD-10-CM

## 2020-06-03 DIAGNOSIS — Z6836 Body mass index (BMI) 36.0-36.9, adult: Secondary | ICD-10-CM

## 2020-06-03 DIAGNOSIS — N182 Chronic kidney disease, stage 2 (mild): Secondary | ICD-10-CM | POA: Diagnosis not present

## 2020-06-03 DIAGNOSIS — M1712 Unilateral primary osteoarthritis, left knee: Secondary | ICD-10-CM

## 2020-06-03 DIAGNOSIS — I129 Hypertensive chronic kidney disease with stage 1 through stage 4 chronic kidney disease, or unspecified chronic kidney disease: Secondary | ICD-10-CM

## 2020-06-03 NOTE — Patient Instructions (Addendum)
Increase Ozempic, to 0.5mg  once weekly  Diabetes Mellitus and Nutrition, Adult When you have diabetes, or diabetes mellitus, it is very important to have healthy eating habits because your blood sugar (glucose) levels are greatly affected by what you eat and drink. Eating healthy foods in the right amounts, at about the same times every day, can help you:  Control your blood glucose.  Lower your risk of heart disease.  Improve your blood pressure.  Reach or maintain a healthy weight. What can affect my meal plan? Every person with diabetes is different, and each person has different needs for a meal plan. Your health care provider may recommend that you work with a dietitian to make a meal plan that is best for you. Your meal plan may vary depending on factors such as:  The calories you need.  The medicines you take.  Your weight.  Your blood glucose, blood pressure, and cholesterol levels.  Your activity level.  Other health conditions you have, such as heart or kidney disease. How do carbohydrates affect me? Carbohydrates, also called carbs, affect your blood glucose level more than any other type of food. Eating carbs naturally raises the amount of glucose in your blood. Carb counting is a method for keeping track of how many carbs you eat. Counting carbs is important to keep your blood glucose at a healthy level, especially if you use insulin or take certain oral diabetes medicines. It is important to know how many carbs you can safely have in each meal. This is different for every person. Your dietitian can help you calculate how many carbs you should have at each meal and for each snack. How does alcohol affect me? Alcohol can cause a sudden decrease in blood glucose (hypoglycemia), especially if you use insulin or take certain oral diabetes medicines. Hypoglycemia can be a life-threatening condition. Symptoms of hypoglycemia, such as sleepiness, dizziness, and confusion, are  similar to symptoms of having too much alcohol.  Do not drink alcohol if: ? Your health care provider tells you not to drink. ? You are pregnant, may be pregnant, or are planning to become pregnant.  If you drink alcohol: ? Do not drink on an empty stomach. ? Limit how much you use to:  0-1 drink a day for women.  0-2 drinks a day for men. ? Be aware of how much alcohol is in your drink. In the [place] one drink equals one 12 oz bottle of beer (355 mL), one 5 oz glass of wine (148 mL), or one 1 oz glass of hard liquor (44 mL). ? Keep yourself hydrated with water, diet soda, or unsweetened iced tea.  Keep in mind that regular soda, juice, and other mixers may contain a lot of sugar and must be counted as carbs. What are tips for following this plan? Reading food labels  Start by checking the serving size on the "Nutrition Facts" label of packaged foods and drinks. The amount of calories, carbs, fats, and other nutrients listed on the label is based on one serving of the item. Many items contain more than one serving per package.  Check the total grams (g) of carbs in one serving. You can calculate the number of servings of carbs in one serving by dividing the total carbs by 15. For example, if a food has 30 g of total carbs per serving, it would be equal to 2 servings of carbs.  Check the number of grams (g) of saturated fats and trans fats  in one serving. Choose foods that have a low amount or none of these fats.  Check the number of milligrams (mg) of salt (sodium) in one serving. Most people should limit total sodium intake to less than 2,300 mg per day.  Always check the nutrition information of foods labeled as "low-fat" or "nonfat." These foods may be higher in added sugar or refined carbs and should be avoided.  Talk to your dietitian to identify your daily goals for nutrients listed on the label. Shopping  Avoid buying canned, pre-made, or processed foods. These foods tend to  be high in fat, sodium, and added sugar.  Shop around the outside edge of the grocery store. This is where you will most often find fresh fruits and vegetables, bulk grains, fresh meats, and fresh dairy. Cooking  Use low-heat cooking methods, such as baking, instead of high-heat cooking methods like deep frying.  Cook using healthy oils, such as olive, canola, or sunflower oil.  Avoid cooking with butter, cream, or high-fat meats. Meal planning  Eat meals and snacks regularly, preferably at the same times every day. Avoid going long periods of time without eating.  Eat foods that are high in fiber, such as fresh fruits, vegetables, beans, and whole grains. Talk with your dietitian about how many servings of carbs you can eat at each meal.  Eat 4-6 oz (112-168 g) of lean protein each day, such as lean meat, chicken, fish, eggs, or tofu. One ounce (oz) of lean protein is equal to: ? 1 oz (28 g) of meat, chicken, or fish. ? 1 egg. ?  cup (62 g) of tofu.  Eat some foods each day that contain healthy fats, such as avocado, nuts, seeds, and fish.   What foods should I eat? Fruits Berries. Apples. Oranges. Peaches. Apricots. Plums. Grapes. Mango. Papaya. Pomegranate. Kiwi. Cherries. Vegetables Lettuce. Spinach. Leafy greens, including kale, chard, collard greens, and mustard greens. Beets. Cauliflower. Cabbage. Broccoli. Carrots. Green beans. Tomatoes. Peppers. Onions. Cucumbers. Brussels sprouts. Grains Whole grains, such as whole-wheat or whole-grain bread, crackers, tortillas, cereal, and pasta. Unsweetened oatmeal. Quinoa. Brown or wild rice. Meats and other proteins Seafood. Poultry without skin. Lean cuts of poultry and beef. Tofu. Nuts. Seeds. Dairy Low-fat or fat-free dairy products such as milk, yogurt, and cheese. The items listed above may not be a complete list of foods and beverages you can eat. Contact a dietitian for more information. What foods should I  avoid? Fruits Fruits canned with syrup. Vegetables Canned vegetables. Frozen vegetables with butter or cream sauce. Grains Refined white flour and flour products such as bread, pasta, snack foods, and cereals. Avoid all processed foods. Meats and other proteins Fatty cuts of meat. Poultry with skin. Breaded or fried meats. Processed meat. Avoid saturated fats. Dairy Full-fat yogurt, cheese, or milk. Beverages Sweetened drinks, such as soda or iced tea. The items listed above may not be a complete list of foods and beverages you should avoid. Contact a dietitian for more information. Questions to ask a health care provider  Do I need to meet with a diabetes educator?  Do I need to meet with a dietitian?  What number can I call if I have questions?  When are the best times to check my blood glucose? Where to find more information:  American Diabetes Association: diabetes.org  Academy of Nutrition and Dietetics: www.eatright.[employer] of Diabetes and Digestive and Kidney Diseases: [employer]  Association of Diabetes Care and Education Specialists: www.diabeteseducator.org Summary  It is important to have healthy eating habits because your blood sugar (glucose) levels are greatly affected by what you eat and drink.  A healthy meal plan will help you control your blood glucose and maintain a healthy lifestyle.  Your health care provider may recommend that you work with a dietitian to make a meal plan that is best for you.  Keep in mind that carbohydrates (carbs) and alcohol have immediate effects on your blood glucose levels. It is important to count carbs and to use alcohol carefully. This information is not intended to replace advice given to you by your health care provider. Make sure you discuss any questions you have with your health care provider. Document Revised: 02/13/2019 Document Reviewed: 02/13/2019 Elsevier Patient Education  2021 [employer].

## 2020-06-03 NOTE — Progress Notes (Signed)
I,[name],acting as a Education administrator for [name], MD.,have documented all relevant documentation on the behalf of [name], MD,as directed by  [name], MD while in the presence of [name], MD.  This visit occurred during the SARS-CoV-2 public health emergency.  Safety protocols were in place, including screening questions prior to the visit, additional usage of staff PPE, and extensive cleaning of exam room while observing appropriate contact time as indicated for disinfecting solutions.  Subjective:     Patient ID: Leslie Davis , female    DOB: 12/17/1955 , 65 y.o.   MRN: [id number]   Chief Complaint  Patient presents with  . Diabetes  . Hypertension    HPI  Patient here for a f/u on her diabetes/htn. She reports compliance with meds. She reports she has cut back on her intake of starches. She reports she has been exercising more regularly. She feels her sugars have improved greatly since her last visit. This morning, her BS was 79. She states they are typically 80-120. She wants to get her sugars controlled so she can move forward with a knee replacement.   Diabetes She presents for her follow-up diabetic visit. She has type 2 diabetes mellitus. Her disease course has been stable. There are no hypoglycemic associated symptoms. Pertinent negatives for diabetes include no blurred vision, no polydipsia, no polyphagia and no polyuria. There are no hypoglycemic complications. Diabetic complications include nephropathy. Risk factors for coronary artery disease include diabetes mellitus, dyslipidemia, hypertension and post-menopausal. She participates in exercise intermittently. An ACE inhibitor/angiotensin II receptor blocker is being taken. Eye exam is current.  Hypertension This is a chronic problem. The current episode started more than 1 year ago. The problem has been gradually improving since onset. Pertinent negatives include no blurred vision. Risk  factors for coronary artery disease include dyslipidemia and diabetes mellitus. The current treatment provides moderate improvement.     Past Medical History:  Diagnosis Date  . DM (diabetes mellitus) ([place])   . Fatty liver disease, nonalcoholic   . Gastritis   . Gastroparesis   . GERD (gastroesophageal reflux disease)   . Hiatal hernia   . Hiatal hernia   . HLD (hyperlipidemia)   . HTN (hypertension)   . Renal calculus      Family History  Problem Relation Age of Onset  . Diabetes Mother   . Diabetes Maternal Grandmother   . Stomach cancer Maternal Aunt        X 2 aunts  . Hypertension Father   . Colon cancer Neg Hx   . Esophageal cancer Neg Hx   . Rectal cancer Neg Hx      Current Outpatient Medications:  .  amLODipine (NORVASC) 10 MG tablet, Take 1 tablet (10 mg total) by mouth daily., Disp: 90 tablet, Rfl: 2 .  aspirin EC 81 MG tablet, Take 1 tablet (81 mg total) by mouth daily., Disp: 90 tablet, Rfl: 2 .  Azilsartan Medoxomil (EDARBI) 80 MG TABS, TAKE 1 TABLET EACH DAY., Disp: 90 tablet, Rfl: 2 .  benzonatate (TESSALON PERLES) 100 MG capsule, Take 1 capsule (100 mg total) by mouth every 6 (six) hours as needed for cough., Disp: 30 capsule, Rfl: 1 .  Blood Glucose Monitoring Suppl (ONETOUCH VERIO) w/Device KIT, Use as directed to check blood sugars 2 times per day dx: e11.65, Disp: 1 kit, Rfl: 1 .  cetirizine (ZYRTEC) 10 MG tablet, Take 1 tablet (10 mg total) by mouth daily., Disp: 90  tablet, Rfl: 2 .  Cholecalciferol (VITAMIN D3) 1.25 MG (50000 UT) CAPS, Take 1 capsule by mouth on Tuesday and Friday, Disp: 24 capsule, Rfl: 1 .  ferrous gluconate (FERGON) 324 MG tablet, TAKE 1 TABLET BY MOUTH TWICE DAILY WITH A MEAL., Disp: 100 tablet, Rfl: 0 .  glucose blood (ONETOUCH VERIO) test strip, Use as instructed to check blood sugars 2 times per day dx:e11.65, Disp: 150 each, Rfl: 3 .  insulin glargine, 1 Unit Dial, (TOUJEO SOLOSTAR) 300 UNIT/ML Solostar Pen, Inject 50 units  subcutaneous at bedtime, Disp: 22.5 mL, Rfl: 1 .  insulin lispro (HUMALOG KWIKPEN) 100 UNIT/ML KwikPen, Sliding scale, Disp: 30 mL, Rfl: 2 .  Insulin Pen Needle 32G X 6 MM MISC, Use with pen injectors daily dx code: e11.9, Disp: 150 each, Rfl: 3 .  Magnesium 400 MG TABS, One tab po qpm, Disp: 90 tablet, Rfl: 1 .  OneTouch Delica Lancets 38S MISC, Use as directed to check blood sugars 2 times per day dx:e11.65, Disp: 150 each, Rfl: 3 .  pantoprazole (PROTONIX) 40 MG tablet, Take one tablet by mouth 3 days a week, Disp: 90 tablet, Rfl: 2 .  rosuvastatin (CRESTOR) 20 MG tablet, Take 1 tablet (20 mg total) by mouth daily., Disp: 90 tablet, Rfl: 1 .  Semaglutide,0.25 or 0.5MG /DOS, (OZEMPIC, 0.25 OR 0.5 MG/DOSE,) 2 MG/1.5ML SOPN, Inject 0.25 mg into the skin once a week., Disp: 1.5 mL, Rfl: 2 .  temazepam (RESTORIL) 30 MG capsule, One capsules po qhs prn, Disp: 60 capsule, Rfl: 0 .  traMADol (ULTRAM) 50 MG tablet, TAKE ONE TABLET BY MOUTH EVERY 6 HOURS AS NEEDED, Disp: 30 tablet, Rfl: 0   Allergies  Allergen Reactions  . Fiasp [Insulin Aspart (W-Niacinamide)] Hives     Review of Systems  Constitutional: Negative.   Eyes: Negative for blurred vision.  Respiratory: Negative.   Cardiovascular: Negative.   Gastrointestinal: Negative.   Endocrine: Negative for polydipsia, polyphagia and polyuria.  Musculoskeletal: Positive for arthralgias.       She has chronic left knee pain. This sometimes interferes with her exercise.   Psychiatric/Behavioral: Negative.   All other systems reviewed and are negative.    Today's Vitals   06/03/20 0852  BP: (!) 154/70  Pulse: 64  Temp: 98.2 F (36.8 C)  TempSrc: Oral  Weight: 214 lb 3.2 oz (97.2 kg)  Height: 5' 4.5" (1.638 m)  PainSc: 8   PainLoc: Knee   Body mass index is 36.2 kg/m.  Wt Readings from Last 3 Encounters:  06/03/20 214 lb 3.2 oz (97.2 kg)  05/28/20 216 lb (98 kg)  04/01/20 210 lb (95.3 kg)   BP Readings from Last 3 Encounters:   06/03/20 (!) 154/70  02/21/20 (!) 152/68  11/15/19 128/78    Objective:  Physical Exam Vitals and nursing note reviewed.  Constitutional:      Appearance: Normal appearance. She is obese.  HENT:     Head: Normocephalic and atraumatic.  Cardiovascular:     Rate and Rhythm: Normal rate and regular rhythm.     Heart sounds: Normal heart sounds.  Pulmonary:     Breath sounds: Normal breath sounds.  Skin:    General: Skin is warm.  Neurological:     General: No focal deficit present.     Mental Status: She is alert and oriented to person, place, and time.         Assessment And Plan:     1. Type 2 diabetes mellitus with stage  2 chronic kidney disease, with long-term current use of insulin (HCC) Comments: Previous a1c reviewed, 8.7. Goal is less than 7.5. She is encouraged to cut out most things white - breads, rice, pasta. I will increase Ozempic to 0.$RemoveBefo'5mg'zRCJbDytone$  weekly  2. Hypertensive nephropathy Comments: Chronic, uncontrolled. Importance of medication/dietary compliance was d/w patient. Advised to take Edarbi in am and amlodipine in PM.  - Ambulatory referral to Cardiology  3. Primary osteoarthritis of left knee Comments: Chronic, needs improved a1c to move forward with TKR. I will refer her to Cardiology for pre-operative evaluation.   4. Class 2 severe obesity due to excess calories with serious comorbidity and body mass index (BMI) of 36.0 to 36.9 in adult [facility]) She is encouraged to strive for BMI less than 30 to decrease cardiac risk. Advised to aim for at least 150 minutes of exercise per week.   Patient was given opportunity to ask questions. Patient verbalized understanding of the plan and was able to repeat key elements of the plan. All questions were answered to their satisfaction.   I, [name], MD, have reviewed all documentation for this visit. The documentation on 06/08/20 for the exam, diagnosis, procedures, and orders are all accurate and complete.   IF YOU  HAVE BEEN REFERRED TO A SPECIALIST, IT MAY TAKE 1-2 WEEKS TO SCHEDULE/PROCESS THE REFERRAL. IF YOU HAVE NOT HEARD FROM US/SPECIALIST IN TWO WEEKS, PLEASE GIVE [place] A CALL AT [phone].   THE PATIENT IS ENCOURAGED TO PRACTICE SOCIAL DISTANCING DUE TO THE COVID-19 PANDEMIC.

## 2020-06-05 ENCOUNTER — Telehealth: Payer: Medicare Other

## 2020-06-05 ENCOUNTER — Telehealth: Payer: Self-pay

## 2020-06-05 NOTE — Telephone Encounter (Signed)
  Chronic Care Management   Outreach Note  06/05/2020 Name: Leslie Davis MRN: [id number] DOB: April 13, 1955  Referred by: [name], MD Reason for referral : Chronic Care Management (RN CM FU Call Attempt)   An unsuccessful telephone outreach was attempted today. The patient was referred to the case management team for assistance with care management and care coordination.   Follow Up Plan: A HIPAA compliant phone message was left for the patient providing contact information and requesting a return call. Telephone follow up appointment with care management team member scheduled for: 07/10/20  Barb Merino, RN, BSN, CCM Care Management Coordinator [facility]  Direct Phone: [phone]

## 2020-06-06 ENCOUNTER — Encounter: Payer: Medicare Other | Admitting: Physical Therapy

## 2020-06-10 ENCOUNTER — Other Ambulatory Visit: Payer: Self-pay

## 2020-06-10 ENCOUNTER — Ambulatory Visit (INDEPENDENT_AMBULATORY_CARE_PROVIDER_SITE_OTHER): Payer: Medicare Other

## 2020-06-10 DIAGNOSIS — N182 Chronic kidney disease, stage 2 (mild): Secondary | ICD-10-CM

## 2020-06-10 DIAGNOSIS — E1122 Type 2 diabetes mellitus with diabetic chronic kidney disease: Secondary | ICD-10-CM

## 2020-06-10 DIAGNOSIS — E785 Hyperlipidemia, unspecified: Secondary | ICD-10-CM | POA: Diagnosis not present

## 2020-06-10 DIAGNOSIS — I10 Essential (primary) hypertension: Secondary | ICD-10-CM

## 2020-06-10 DIAGNOSIS — Z794 Long term (current) use of insulin: Secondary | ICD-10-CM

## 2020-06-10 DIAGNOSIS — E1169 Type 2 diabetes mellitus with other specified complication: Secondary | ICD-10-CM

## 2020-06-10 MED ORDER — OZEMPIC (0.25 OR 0.5 MG/DOSE) 2 MG/1.5ML ~~LOC~~ SOPN: 0.2500 mg | PEN_INJECTOR | SUBCUTANEOUS | 2 refills | Status: DC

## 2020-06-10 NOTE — Progress Notes (Signed)
Chronic Care Management Pharmacy Note  06/18/2020 Name:  Leslie Davis MRN:  [id number] DOB:  February 20, 1956  Subjective: Leslie Davis is an 65 y.o. year old female who is a primary patient of [name], MD.  The CCM team was consulted for assistance with disease management and care coordination needs.    Engaged with patient by telephone for follow up visit in response to provider referral for pharmacy case management and/or care coordination services.   Consent to Services:  The patient was given information about Chronic Care Management services, agreed to services, and gave verbal consent prior to initiation of services.  Please see initial visit note for detailed documentation.   Patient Care Team: [name], MD as PCP - General (Internal Medicine) Lynne Logan, RN as Case Manager Mayford Knife, [facility] (Pharmacist)  Recent office visits: 06/03/2020 - PCP OV   Recent consult visits: 05/28/2020 MD [facility] visits: None in previous 6 months  Objective:  Lab Results  Component Value Date   CREATININE 1.15 (H) 02/21/2020   BUN 24 02/21/2020   GFR 77.09 11/17/2017   GFRNONAA 50 (L) 02/21/2020   GFRAA 57 (L) 02/21/2020   NA 141 02/21/2020   K 4.7 02/21/2020   CALCIUM 9.5 02/21/2020   CO2 21 02/21/2020   GLUCOSE 148 (H) 02/21/2020    Lab Results  Component Value Date/Time   HGBA1C 8.7 (H) 05/28/2020 09:57 AM   HGBA1C 8.8 (H) 02/21/2020 03:00 PM   GFR 77.09 11/17/2017 09:00 AM   GFR 93.79 10/17/2014 11:22 AM   MICROALBUR 150 11/15/2019 10:20 AM    Last diabetic Eye exam:  Lab Results  Component Value Date/Time   HMDIABEYEEXA No Retinopathy 08/02/2019 12:00 AM    Last diabetic Foot exam: No results found for: HMDIABFOOTEX   Lab Results  Component Value Date   CHOL 209 (H) 11/15/2019   HDL 87 11/15/2019   LDLCALC 109 (H) 11/15/2019   TRIG 73 11/15/2019   CHOLHDL 2.4 11/15/2019    Hepatic Function Latest Ref Rng & Units  11/15/2019 10/06/2019 09/06/2019  Total Protein 6.0 - 8.5 g/dL 6.9 8.1 7.8  Albumin 3.8 - 4.8 g/dL 3.9 4.1 4.2  AST 0 - 40 IU/L _0 ALT 0 - 32 IU/L 47(H) 37 38  Alk Phosphatase 48 - 121 IU/L 130(H) 121 125  Total Bilirubin 0.0 - 1.2 mg/dL 0.2 0.6 0.5  Bilirubin, Direct 0.0 - 0.2 mg/dL - - 0.1    Lab Results  Component Value Date/Time   TSH 1.250 05/16/2019 03:08 PM   TSH 1.690 11/08/2018 10:17 AM    CBC Latest Ref Rng & Units 11/15/2019 10/06/2019 09/08/2019  WBC 3.4 - 10.8 x10E3/uL 5.3 9.4 5.2  Hemoglobin 11.1 - 15.9 g/dL 12.9 12.1 11.5(L)  Hematocrit 34.0 - 46.6 % 38.9 36.5 35.4(L)  Platelets 150 - 450 x10E3/uL 287 271 292    Lab Results  Component Value Date/Time   VD25OH 24.3 (L) 11/15/2019 10:29 AM    Clinical ASCVD: No  The 10-year ASCVD risk score Mikey Bussing DC Jr., et al., 2013) is: 30.4%   Values used to calculate the score:     Age: 29 years     Sex: Female     Is Non-Hispanic African American: Yes     Diabetic: Yes     Tobacco smoker: No     Systolic Blood Pressure: 882 mmHg     Is BP treated: Yes     HDL Cholesterol:  87 mg/dL     Total Cholesterol: 209 mg/dL    Depression screen [facility] 2/9 11/15/2019 02/08/2019 11/08/2018  Decreased Interest 0 0 0  Down, Depressed, Hopeless 0 0 0  PHQ - 2 Score 0 0 0  Altered sleeping 0 - -  Tired, decreased energy 0 - -  Change in appetite 0 - -  Feeling bad or failure about yourself  0 - -  Trouble concentrating 0 - -  Moving slowly or fidgety/restless 0 - -  Suicidal thoughts 0 - -  PHQ-9 Score 0 - -  Difficult doing work/chores Not difficult at all - -  Some recent data might be hidden     Social History   Tobacco Use  Smoking Status Former Smoker  . Packs/day: 0.25  . Years: 1.00  . Pack years: 0.25  . Quit date: 09/10/1974  . Years since quitting: 45.8  Smokeless Tobacco Never Used  Tobacco Comment   she no longer smokes.    BP Readings from Last 3 Encounters:  06/03/20 (!) 154/70  02/21/20 (!) 152/68   11/15/19 128/78   Pulse Readings from Last 3 Encounters:  06/03/20 64  02/21/20 74  11/15/19 61   Wt Readings from Last 3 Encounters:  06/03/20 214 lb 3.2 oz (97.2 kg)  05/28/20 216 lb (98 kg)  04/01/20 210 lb (95.3 kg)   BMI Readings from Last 3 Encounters:  06/03/20 36.20 kg/m  05/28/20 36.50 kg/m  04/01/20 36.05 kg/m    Assessment/Interventions: Review of patient past medical history, allergies, medications, health status, including review of consultants reports, laboratory and other test data, was performed as part of comprehensive evaluation and provision of chronic care management services.   SDOH:  (Social Determinants of Health) assessments and interventions performed: No   CCM Care Plan  Allergies  Allergen Reactions  . Fiasp [Insulin Aspart (W-Niacinamide)] Hives    Medications Reviewed Today    Reviewed by Mayford Knife, RPH (Pharmacist) on 06/10/20 at 1135  Med List Status: <None>  Medication Order Taking? Sig Documenting Provider Last Dose Status Informant  amLODipine (NORVASC) 10 MG tablet [id number] Yes Take 1 tablet (10 mg total) by mouth daily. [name], MD Taking Active   aspirin EC 81 MG tablet [id number] Yes Take 1 tablet (81 mg total) by mouth daily. [name], MD Taking Active   Azilsartan Medoxomil (EDARBI) 80 MG TABS [id number] Yes TAKE 1 TABLET EACH DAY. [name], MD Taking Active   benzonatate (TESSALON PERLES) 100 MG capsule [id number] No Take 1 capsule (100 mg total) by mouth every 6 (six) hours as needed for cough.  Patient not taking: Reported on 06/10/2020   [name], FNP Not Taking Active   Blood Glucose Monitoring Suppl [facility] VERIO) w/Device Drucie Opitz [id number] Yes Use as directed to check blood sugars 2 times per day dx: e11.65 [name], MD Taking Active Self  cetirizine (ZYRTEC) 10 MG tablet [id number] Yes Take 1 tablet (10 mg total) by mouth daily. [name], MD Taking Active   Cholecalciferol (VITAMIN D3)  1.25 MG (50000 UT) CAPS [id number] Yes Take 1 capsule by mouth on Tuesday and Friday [name], MD Taking Active   ferrous gluconate (FERGON) 324 MG tablet [id number] Yes TAKE 1 TABLET BY MOUTH TWICE DAILY WITH A MEAL. Mansouraty, Telford Nab., MD Taking Active Self  glucose blood [facility] VERIO) test strip [id number] Yes Use as instructed to check blood sugars 2 times per day dx:e11.65 [name], MD Taking Active   insulin  glargine, 1 Unit Dial, (TOUJEO SOLOSTAR) 300 UNIT/ML Solostar Pen [id number] Yes Inject 50 units subcutaneous at bedtime [name], MD Taking Active   insulin lispro (HUMALOG KWIKPEN) 100 UNIT/ML KwikPen [id number] Yes Sliding scale [name], MD Taking Active   Insulin Pen Needle 32G X 6 MM MISC [id number] Yes Use with pen injectors daily dx code: e11.9 [name], MD Taking Active   Magnesium 400 MG TABS [id number] Yes One tab po qpm [name], MD Taking Active   [facility] Lancets 49Q [place] [id number] Yes Use as directed to check blood sugars 2 times per day dx:e11.65 [name], MD Taking Active Self  pantoprazole (PROTONIX) 40 MG tablet [id number] Yes Take one tablet by mouth 3 days a week [name], MD Taking Active   rosuvastatin (CRESTOR) 20 MG tablet [id number] Yes Take 1 tablet (20 mg total) by mouth daily. [name], MD Taking Active Self  Semaglutide,0.25 or 0.5MG/DOS, (OZEMPIC, 0.25 OR 0.5 MG/DOSE,) 2 MG/1.5ML SOPN [id number] Yes Inject 0.25 mg into the skin once a week. [name], MD Taking Active   temazepam (RESTORIL) 30 MG capsule [id number] Yes One capsules po qhs prn [name], MD Taking Active   traMADol (ULTRAM) 50 MG tablet [id number] Yes TAKE ONE TABLET BY MOUTH EVERY 6 HOURS AS NEEDED Aundra Dubin, PA-C Taking Active           Patient Active Problem List   Diagnosis Date Noted  . AKI (acute kidney injury) ([place]) 09/07/2019  . Acute kidney injury ([place]) 09/06/2019  . Bradycardia 09/06/2019  . Hyperglycemia  09/06/2019  . HLD (hyperlipidemia)   . Hypertensive nephropathy 05/16/2019  . Erosive gastritis 11/16/2017  . Fatty liver 11/16/2017  . Abdominal pain 12/11/2012  . Early satiety 12/11/2012  . Hiatal hernia   . Hx of cholecystectomy 02/05/2011  . Flu-like symptoms 02/05/2011  . CONSTIPATION 08/27/2008  . FATTY LIVER DISEASE 05/07/2008  . Nausea with vomiting 04/23/2008  . EPIGASTRIC PAIN 04/23/2008  . RENAL CALCULUS 04/22/2008  . Type 2 diabetes mellitus with stage 2 chronic kidney disease, with long-term current use of insulin ([place]) 05/19/2006  . Type 2 diabetes mellitus with hyperlipidemia ([place]) 05/19/2006  . HYPERTENSION, BENIGN SYSTEMIC 05/19/2006  . GASTROESOPHAGEAL REFLUX, NO ESOPHAGITIS 05/19/2006  . AMENORRHEA 05/19/2006    Immunization History  Administered Date(s) Administered  . DTaP 03/26/2013  . Fluad Quad(high Dose 65+) 11/08/2018, 02/21/2020  . Influenza, High Dose Seasonal PF 11/08/2018  . Influenza-Unspecified 11/30/2017  . Moderna Sars-Covid-2 Vaccination 08/03/2019, 08/31/2019  . Td 08/20/2005    Conditions to be addressed/monitored:  Hypertension, Hyperlipidemia and Diabetes  Care Plan : [place]  Updates made by Mayford Knife, RPH since 06/18/2020 12:00 AM    Problem: HTN, HLD, DM II   Priority: High    Long-Range Goal: DISEASE MANAGEMENT   This Visit's Progress: On track  Priority: High  Note:    Current Barriers:  . Unable to independently monitor therapeutic efficacy . Unable to achieve control of HTN, DM, HLD.    Pharmacist Clinical Goal(s):  Marland Kitchen Patient will achieve adherence to monitoring guidelines and medication adherence to achieve therapeutic efficacy through collaboration with PharmD and provider.    Interventions: . 1:1 collaboration with [name], MD regarding development and update of comprehensive plan of care as evidenced by provider attestation and co-signature . Inter-disciplinary care team  collaboration (see longitudinal plan of care) . Comprehensive medication review performed; medication list updated in electronic medical record  Hypertension (BP  goal <130/80) -Uncontrolled -Current treatment: . Edarbi 80 mg tablet once per day  . Amlodipine 10 mg tablet at bedtime  -Current home readings: 147/77 -Current dietary habits: patient reports that she is doing pretty, she is trying to eat more vegetables, and chicken and fish, she is using low salt seasoning -Current exercise habits: Patient is exercising for at least an hour and a half at least two times per week.  -Denies hypotensive/hypertensive symptoms -Educated on Daily salt intake goal < 2300 mg; -Counseled to monitor BP at home at least once per day, document, and provide log at future appointments -Counseled on diet and exercise extensively Recommended to continue current medication Collaborated with patient to discuss how to properly check her BP at home.   Hyperlipidemia: (LDL goal < 109) -Uncontrolled -Current treatment: . Crestor 20 mg tablet once per day  -Current dietary patterns: patient reports that she is eating fried fatty foods about twice per week  -Educated on Cholesterol goals;  Benefits of statin for ASCVD risk reduction; Importance of limiting foods high in cholesterol; Exercise goal of 150 minutes per week; -Counseled on diet and exercise extensively Recommended to continue current medication Collaborated with patient and she is going to stop eating fried food.   Diabetes (A1c goal <7%) -Uncontrolled -Current medications: . Ozempic 0.25 mg once per week - Wednesday  . Toujeo - 50 units at bedtime  . Humalog Sliding Scale o Used only twice in the past month  -Current home glucose readings . fasting glucose: 88 - 100  . Evening glucose: 70-140 o BS of 70 happened one time, and she was able to eat hard candy to bring her BS up.  -Denies hypoglycemic/hyperglycemic symptoms -Current meal  patterns: will discuss during next CCM visit.  -Current exercise: going to the gym four times per week, she is at the gym for an 1 hour and half. She tries to walk as much as she can.  -Educated on A1c and blood sugar goals; Complications of diabetes including kidney damage, retinal damage, and cardiovascular disease; Exercise goal of 150 minutes per week; Benefits of weight loss; Encouraged patient to drink plenty of water.  -Counseled to check feet daily and get yearly eye exams.  -Counseled on diet and exercise extensively Recommended to continue current medication   Patient Goals/Self-Care Activities . Patient will:  - take medications as prescribed focus on medication adherence by having medication delivered at the same time check glucose at least twice per day, document, and provide at future appointments check blood pressure once per day, document, and provide at future appointments  Follow Up Plan: Telephone follow up appointment with care management team member scheduled for: 07/15/2020 The patient has been provided with contact information for the care management team and has been advised to call with any health related questions or concerns.       Medication Assistance: None required.  Patient affirms current coverage meets needs.  Patient's preferred pharmacy is:  [facility] - [place], [place] - [address] [place] [address] Phone: [phone] Fax: [phone]  Uses pill box? Yes Pt endorses 90% compliance  We discussed: Benefits of medication synchronization, packaging and delivery as well as enhanced pharmacist oversight with [facility]. Patient decided to: Utilize [facility] pharmacy for medication synchronization, packaging and delivery  Care Plan and Follow Up Patient Decision:  Patient agrees to Care Plan and Follow-up.  Plan: Telephone follow up appointment with care management team member  scheduled  for:  07/15/2020 and The patient has been provided with contact information for the care management team and has been advised to call with any health related questions or concerns.   [name], PharmD Clinical Pharmacist [facility] [phone]

## 2020-06-18 NOTE — Patient Instructions (Signed)
Visit Information It was great speaking with you today!  Please let me know if you have any questions about our visit.  Goals Addressed            This Visit's Progress   . Manage My Medicine       Timeframe:  Long-Range Goal Priority:  High Start Date:                             Expected End Date:                       Follow Up Date 07/15/2020   - call if I am sick and can't take my medicine - keep a list of all the medicines I take; vitamins and herbals too - use a pillbox to sort medicine - use an alarm clock or phone to remind me to take my medicine    Why is this important?   . These steps will help you keep on track with your medicines.          Patient Care Plan: Hypertension (Adult)    Problem Identified: Hypertension (Hypertension)   Priority: High    Long-Range Goal: Hypertension Monitored   Start Date: 04/21/2020  Expected End Date: 10/17/2020  This Visit's Progress: On track  Priority: High  Note:   Objective:  . Last practice recorded BP readings:  BP Readings from Last 3 Encounters:  02/21/20 (!) 152/68  11/15/19 128/78  11/15/19 128/78 .   Marland Kitchen Most recent eGFR/CrCl: No results found for: EGFR  No components found for: CRCL Current Barriers:  Marland Kitchen Knowledge Deficits related to basic understanding of hypertension pathophysiology and self care management . Knowledge Deficits related to understanding of medications prescribed for management of hypertension Case Manager Clinical Goal(s):  Marland Kitchen Over the next 180 days, patient will demonstrate improved adherence to prescribed treatment plan for hypertension as evidenced by taking all medications as prescribed, monitoring and recording blood pressure as directed, adhering to low sodium/DASH diet Interventions:  . Collaboration with [name], MD regarding development and update of comprehensive plan of care as evidenced by provider attestation and co-signature . Inter-disciplinary care team collaboration  (see longitudinal plan of care) . Evaluation of current treatment plan related to hypertension self management and patient's adherence to plan as established by provider. . Provided education to patient re: stroke prevention, s/s of heart attack and stroke, DASH diet, complications of uncontrolled blood pressure . Reviewed medications with patient and discussed importance of compliance . Advised patient, providing education and rationale, to monitor blood pressure daily and record, calling PCP for findings outside established parameters.  . Reviewed scheduled/upcoming provider appointments including: PCP follow up with Dr. Baird Cancer, 06/03/20@ 8:30 am  . Discussed plans with patient for ongoing care management follow up and provided patient with direct contact information for care management team Patient Goals/Self-Care Activities . Over the next 180 days, patient will:  - Self administers medications as prescribed Attends all scheduled provider appointments Calls provider office for new concerns, questions, or BP outside discussed parameters Checks BP and records as discussed Follows a low sodium diet/DASH diet Follow Up Plan: Telephone follow up appointment with care management team member scheduled for: 06/05/20      Patient Care Plan: Osteoarthritis (Adult)    Problem Identified: Mobility and Function (Osteoarthritis)   Priority: High    Goal: Maintain Mobility and Function   Start Date: 04/21/2020  Expected End Date: 07/18/2020  This Visit's Progress: On track  Priority: High  Note:   Current Barriers:   Ineffective Self Health Maintenance  Currently UNABLE TO independently self manage needs related to chronic health conditions.   Knowledge Deficits related to short term plan for care coordination needs and long term plans for chronic disease management needs Case Manager Clinical Goal(s):  Marland Kitchen Collaboration with Dorothyann Peng, MD regarding development and update of comprehensive  plan of care as evidenced by provider attestation and co-signature . Inter-disciplinary care team collaboration (see longitudinal plan of care)  Over the next 90 days, patient will work with care management team to address care coordination and chronic disease management needs related to Disease Management  Educational Needs  Care Coordination  Medication Management and Education  Psychosocial Support   Interventions:   Successful call completed with patient   Assessed for adherence to prescribed treatment plan by provider for Osteoarthritis  Acknowledge and validate impact of pain, loss of strength and potential disfigurement (hand osteoarthritis) on mental health and daily life, such as social isolation, anxiety, depression, impaired sexual relationship and  or injury from falls  Determined patient continues to attend outpatient PT for improved strength of left knee  Determined patient continues to follow up with Dr. Roda Shutters for ongoing evaluation and treatment of Osteoarthritis   Determined patient will continue to use conservative therapy to help avoid surgical intervention  Assessed for barriers to participation in therapy or exercise, such as pain with activity, anticipated or imagined pain.   Discussed plans with patient for ongoing care management follow up and provided patient with direct contact information for care management team Patient Goals/Self Care Activities:  Over the next 90 days, patient will:  Continue to follow up with outpatient PT as directed Keep all MD follow up appointments as scheduled Take pain medication exactly as prescribed for left knee pain Continue to adhere to HEP as directed by PT   Follow Up Plan: Telephone follow up appointment with care management team member scheduled for: 06/05/20       Patient Care Plan: CCM Pharmacy Care Plan    Problem Identified: HTN, HLD, DM II   Priority: High    Long-Range Goal: DISEASE MANAGEMENT   This  Visit's Progress: On track  Priority: High  Note:    Current Barriers:  . Unable to independently monitor therapeutic efficacy . Unable to achieve control of HTN, DM, HLD.    Pharmacist Clinical Goal(s):  Marland Kitchen Patient will achieve adherence to monitoring guidelines and medication adherence to achieve therapeutic efficacy through collaboration with PharmD and provider.    Interventions: . 1:1 collaboration with Dorothyann Peng, MD regarding development and update of comprehensive plan of care as evidenced by provider attestation and co-signature . Inter-disciplinary care team collaboration (see longitudinal plan of care) . Comprehensive medication review performed; medication list updated in electronic medical record  Hypertension (BP goal <130/80) -Uncontrolled -Current treatment: . Edarbi 80 mg tablet once per day  . Amlodipine 10 mg tablet at bedtime  -Current home readings: 147/77 -Current dietary habits: patient reports that she is doing pretty, she is trying to eat more vegetables, and chicken and fish, she is using low salt seasoning -Current exercise habits: Patient is exercising for at least an hour and a half at least two times per week.  -Denies hypotensive/hypertensive symptoms -Educated on Daily salt intake goal < 2300 mg; -Counseled to monitor BP at home at least once per day, document, and provide log  at future appointments -Counseled on diet and exercise extensively Recommended to continue current medication Collaborated with patient to discuss how to properly check her BP at home.   Hyperlipidemia: (LDL goal < 109) -Uncontrolled -Current treatment: . Crestor 20 mg tablet once per day  -Current dietary patterns: patient reports that she is eating fried fatty foods about twice per week  -Educated on Cholesterol goals;  Benefits of statin for ASCVD risk reduction; Importance of limiting foods high in cholesterol; Exercise goal of 150 minutes per week; -Counseled on  diet and exercise extensively Recommended to continue current medication Collaborated with patient and she is going to stop eating fried food.   Diabetes (A1c goal <7%) -Uncontrolled -Current medications: . Ozempic 0.25 mg once per week - Wednesday  . Toujeo - 50 units at bedtime  . Humalog Sliding Scale o Used only twice in the past month  -Current home glucose readings . fasting glucose: 88 - 100  . Evening glucose: 70-140 o BS of 70 happened one time, and she was able to eat hard candy to bring her BS up.  -Denies hypoglycemic/hyperglycemic symptoms -Current meal patterns: will discuss during next CCM visit.  -Current exercise: going to the gym four times per week, she is at the gym for an 1 hour and half. She tries to walk as much as she can.  -Educated on A1c and blood sugar goals; Complications of diabetes including kidney damage, retinal damage, and cardiovascular disease; Exercise goal of 150 minutes per week; Benefits of weight loss; Encouraged patient to drink plenty of water.  -Counseled to check feet daily and get yearly eye exams.  -Counseled on diet and exercise extensively Recommended to continue current medication   Patient Goals/Self-Care Activities . Patient will:  - take medications as prescribed focus on medication adherence by having medication delivered at the same time check glucose at least twice per day, document, and provide at future appointments check blood pressure once per day, document, and provide at future appointments  Follow Up Plan: Telephone follow up appointment with care management team member scheduled for: 07/15/2020 The patient has been provided with contact information for the care management team and has been advised to call with any health related questions or concerns.       Patient agreed to services and verbal consent obtained.   The patient verbalized understanding of instructions, educational materials, and care plan provided  today and agreed to receive a mailed copy of patient instructions, educational materials, and care plan.   [name], PharmD Clinical Pharmacist [facility] [phone]

## 2020-07-03 ENCOUNTER — Telehealth: Payer: Self-pay

## 2020-07-03 NOTE — Chronic Care Management (AMB) (Signed)
Chronic Care Management Pharmacy Assistant   Name: Leslie Davis  MRN: [id number] DOB: 04-Feb-1956   Reason for Encounter: Medication Review/Medication Coordination Call   Recent office visits:  06/03/2020- Dr. Baird Davis (PCP) 06/10/2020- Leslie Davis, CPP (CCM)  Recent consult visits:  None  Hospital visits:  None in previous 6 months  Reviewed chart for medication changes ahead of medication coordination call.  No medication changes indicated.  BP Readings from Last 3 Encounters:  06/03/20 (!) 154/70  02/21/20 (!) 152/68  11/15/19 128/78    Lab Results  Component Value Date   HGBA1C 8.7 (H) 05/28/2020     Patient obtains medications through Vials  90 Days   Last adherence delivery included:   Amlodipine 10 mg-1 tablet daily  Rosuvastatin20 mg-1 tablet daily  ASA 81 mg-1 tablet daily  Edarbi 80 mg-1 tablet daily  Ozempic 2/1.5 ml- Inject 0.25 mg sq once a week  Onetouch Verio Test Strips- Use to check blood sugars twice daily  Trueplus Pen 31g 14m- Use with pen to inject insulin daily.   Patient declined the following medications last month:   Toujeo-50 unitssq nightlydue to receiving an 81-day supply on 05/05/2020   Temazepam 30 mg- 1 capsule at bedtime as needed (64-day supply)  Vitamin D 50000 units-1 capsule Tuesday and Friday(84-day supply)                         due to receiving a supply on 05/07/2020   Cetirizine 10 mg-1 tablet daily  Pantoprazole 40 mg- 1 tablet three times a week.             Due to receiving a 90 day supply on 05/27/2020   Hydrochlorothiazide 12.5 mg- 1 tablet dailydue to being discontinued   Tramadol 50 mg- 1 tablet by mouth three times daily as needed DUE PRN use  Clindamycin lotion 1%- apply to skin in the morning dailydue to PRN use received a supply on 01/09/2020.  Ferrous Gluconate 324 mg-1 tablet twice daily due to receiving a 90-day supply on 01/09/2020- Patient has OTC left.  Patient  is due for next adherence delivery on: 07/07/2020 Called patient and reviewed medications and coordinated delivery.  This delivery to include: None  Short fill not needed.  Patient declined the following medications:  Amlodipine 10 mg-1 tablet daily  Rosuvastatin20 mg-1 tablet daily  ASA 81 mg-1 tablet daily  Edarbi 80 mg-1 tablet daily  Ozempic 2/1.5 ml- Inject 0.25 mg sq once a week  Onetouch Verio Test Strips- Use to check blood sugars twice daily  Trueplus Pen 31g 658m Use with pen to inject insulin daily.     Due to receiving a 90 day supply on 06/10/2020   Toujeo-50 unitssq nightlydue to receiving an 81-day supply on 05/05/2020   Temazepam 30 mg- 1 capsule at bedtime as needed (64-day supply)  Vitamin D 50000 units-1 capsule Tuesday and FrFRTMYT(11-NBVupply)                         due to receiving a supply on 05/07/2020   Cetirizine 10 mg-1 tablet daily  Pantoprazole 40 mg- 1 tablet three times a week.             Due to receiving a 90 day supply on 05/27/2020   Hydrochlorothiazide 12.5 mg- 1 tablet dailydue to being discontinued   Tramadol 50 mg- 1 tablet by mouth three times daily as needed DUE  PRN use  Clindamycin lotion 1%- apply to skin in the morning dailydue to PRN use received a supply on 01/09/2020.  Ferrous Gluconate 324 mg-1 tablet twice daily due to receiving a 90-day supply on 01/09/2020- Patient has OTC left.   Patient needs refills for: None.  Coordinated future fill for Toujeo-50 unitssq nightlyto be delivered on 07/24/2020.  No delivery needed, Patient aware we will follow up with her next month discuss any medications needs.  Medications: Outpatient Encounter Medications as of 07/03/2020  Medication Sig  . amLODipine (NORVASC) 10 MG tablet Take 1 tablet (10 mg total) by mouth daily.  Marland Kitchen aspirin EC 81 MG tablet Take 1 tablet (81 mg total) by mouth daily.  . Azilsartan Medoxomil (EDARBI) 80 MG TABS TAKE 1 TABLET EACH DAY.   . benzonatate (TESSALON PERLES) 100 MG capsule Take 1 capsule (100 mg total) by mouth every 6 (six) hours as needed for cough. (Patient not taking: Reported on 06/10/2020)  . Blood Glucose Monitoring Suppl (ONETOUCH VERIO) w/Device KIT Use as directed to check blood sugars 2 times per day dx: e11.65  . cetirizine (ZYRTEC) 10 MG tablet Take 1 tablet (10 mg total) by mouth daily.  . Cholecalciferol (VITAMIN D3) 1.25 MG (50000 UT) CAPS Take 1 capsule by mouth on Tuesday and Friday  . ferrous gluconate (FERGON) 324 MG tablet TAKE 1 TABLET BY MOUTH TWICE DAILY WITH A MEAL.  Marland Kitchen glucose blood (ONETOUCH VERIO) test strip Use as instructed to check blood sugars 2 times per day dx:e11.65  . insulin glargine, 1 Unit Dial, (TOUJEO SOLOSTAR) 300 UNIT/ML Solostar Pen Inject 50 units subcutaneous at bedtime  . insulin lispro (HUMALOG KWIKPEN) 100 UNIT/ML KwikPen Sliding scale  . Insulin Pen Needle 32G X 6 MM MISC Use with pen injectors daily dx code: e11.9  . Magnesium 400 MG TABS One tab po qpm  . OneTouch Delica Lancets 90Z MISC Use as directed to check blood sugars 2 times per day dx:e11.65  . pantoprazole (PROTONIX) 40 MG tablet Take one tablet by mouth 3 days a week  . rosuvastatin (CRESTOR) 20 MG tablet Take 1 tablet (20 mg total) by mouth daily.  . Semaglutide,0.25 or 0.5MG/DOS, (OZEMPIC, 0.25 OR 0.5 MG/DOSE,) 2 MG/1.5ML SOPN Inject 0.25 mg into the skin once a week.  . temazepam (RESTORIL) 30 MG capsule One capsules po qhs prn  . traMADol (ULTRAM) 50 MG tablet TAKE ONE TABLET BY MOUTH EVERY 6 HOURS AS NEEDED   No facility-administered encounter medications on file as of 07/03/2020.    Star Rating Drugs: Pioglitazone/Metformin- Last filled 10/6/20219 for 90 day supply at [facility]. Rosuvastatin 20 mg- Last filled 06/10/2020 for 90 day supply at [facility]. Ozempic 0.36m- Last filled 06/10/2020 for 56 day supply at [facility] Metformin 8548m [facility] Note:No medications needed, will follow up with patient next month to review medications. VaOrlando PennerCPP notified.   SIG: TaPattricia BossCMEwa Gentryharmacist Assistant [phone]

## 2020-07-04 LAB — HM HEPATITIS C SCREENING LAB: HM Hepatitis Screen: NEGATIVE

## 2020-07-10 ENCOUNTER — Telehealth: Payer: Self-pay

## 2020-07-10 ENCOUNTER — Telehealth (INDEPENDENT_AMBULATORY_CARE_PROVIDER_SITE_OTHER): Payer: Medicare Other | Admitting: Nurse Practitioner

## 2020-07-10 ENCOUNTER — Telehealth: Payer: Medicare Other

## 2020-07-10 VITALS — BP 140/70 | Temp 97.3°F

## 2020-07-10 DIAGNOSIS — R0981 Nasal congestion: Secondary | ICD-10-CM

## 2020-07-10 DIAGNOSIS — J069 Acute upper respiratory infection, unspecified: Secondary | ICD-10-CM

## 2020-07-10 DIAGNOSIS — R059 Cough, unspecified: Secondary | ICD-10-CM | POA: Diagnosis not present

## 2020-07-10 LAB — POC INFLUENZA A&B (BINAX/QUICKVUE)
Influenza A, POC: NEGATIVE
Influenza B, POC: NEGATIVE

## 2020-07-10 LAB — POC COVID19 BINAXNOW: SARS Coronavirus 2 Ag: NEGATIVE

## 2020-07-10 MED ORDER — AMOXICILLIN-POT CLAVULANATE 875-125 MG PO TABS: 1.0000 | ORAL_TABLET | Freq: Two times a day (BID) | ORAL | 0 refills | Status: DC

## 2020-07-10 MED ORDER — BENZONATATE 100 MG PO CAPS: 100.0000 mg | ORAL_CAPSULE | Freq: Four times a day (QID) | ORAL | 1 refills | Status: DC | PRN

## 2020-07-10 MED ORDER — AZITHROMYCIN 250 MG PO TABS: ORAL_TABLET | ORAL | 0 refills | Status: AC

## 2020-07-10 NOTE — Progress Notes (Signed)
Virtual Visit via phone call due to failed video    This visit type was conducted due to national recommendations for restrictions regarding the COVID-19 Pandemic (e.g. social distancing) in an effort to limit this patient's exposure and mitigate transmission in our community.  Due to her co-morbid illnesses, this patient is at least at moderate risk for complications without adequate follow up.  This format is felt to be most appropriate for this patient at this time.  All issues noted in this document were discussed and addressed.  A limited physical exam was performed with this format.    This visit type was conducted due to national recommendations for restrictions regarding the COVID-19 Pandemic (e.g. social distancing) in an effort to limit this patient's exposure and mitigate transmission in our community.  Patients identity confirmed using two different identifiers.  This format is felt to be most appropriate for this patient at this time.  All issues noted in this document were discussed and addressed.  No physical exam was performed (except for noted visual exam findings with Video Visits).    Date:  07/10/2020   ID:  Leslie Davis, DOB 1956/03/05, MRN [id number]  Patient Location:  Home   Provider location:   Office   Chief Complaint:  Cough and Congestion   History of Present Illness:    Leslie Davis is a 65 y.o. female who presents via video conferencing for a telehealth visit today.    The patient has cough and congestion.   Sinus cold. She has had it for a couple days. She has been taking Mucinex. It started last Friday. No fever. Allergies to pollen. No sneezing. Coughing up yellow. No sore throat. No bodyaches. She hasn't been around sick. Vaccinated but no booster shot. She lives with husband. Husband is fine. No ear ache.  She is not allergic to any medication. She will come in today to get tested for COVID     Past Medical History:  Diagnosis Date  . DM  (diabetes mellitus) ([place])   . Fatty liver disease, nonalcoholic   . Gastritis   . Gastroparesis   . GERD (gastroesophageal reflux disease)   . Hiatal hernia   . Hiatal hernia   . HLD (hyperlipidemia)   . HTN (hypertension)   . Renal calculus    Past Surgical History:  Procedure Laterality Date  . CHOLECYSTECTOMY    . COLONOSCOPY    . TUBAL LIGATION    . UPPER GASTROINTESTINAL ENDOSCOPY       Current Meds  Medication Sig  . azithromycin (ZITHROMAX Z-PAK) 250 MG tablet Take 2 tablets (500 mg) on  Day 1,  followed by 1 tablet (250 mg) once daily on Days 2 through 5.  . [DISCONTINUED] amoxicillin-clavulanate (AUGMENTIN) 875-125 MG tablet Take 1 tablet by mouth 2 (two) times daily for 7 days.     Allergies:   Fiasp [insulin aspart (w-niacinamide)]   Social History   Tobacco Use  . Smoking status: Former Smoker    Packs/day: 0.25    Years: 1.00    Pack years: 0.25    Quit date: 09/10/1974    Years since quitting: 45.8  . Smokeless tobacco: Never Used  . Tobacco comment: she no longer smokes.   Vaping Use  . Vaping Use: Never used  Substance Use Topics  . Alcohol use: No  . Drug use: No     Family Hx: The patient's family history includes Diabetes in her maternal grandmother and mother; Hypertension  in her father; Stomach cancer in her maternal aunt. There is no history of Colon cancer, Esophageal cancer, or Rectal cancer.  ROS:   Please see the history of present illness.    Review of Systems  Constitutional: Negative for chills, fever and malaise/fatigue.  HENT: Positive for congestion. Negative for sinus pain and sore throat.   Respiratory: Positive for cough and sputum production. Negative for shortness of breath and wheezing.   Cardiovascular: Negative for chest pain and palpitations.  Gastrointestinal: Negative for constipation and diarrhea.  Neurological: Negative for dizziness and headaches.    All other systems reviewed and are negative.   Labs/Other  Tests and Data Reviewed:    Recent Labs: 09/11/2019: Magnesium 2.6 11/15/2019: ALT 47; Hemoglobin 12.9; Platelets 287 02/21/2020: BUN 24; Creatinine, Ser 1.15; Potassium 4.7; Sodium 141   Recent Lipid Panel Lab Results  Component Value Date/Time   CHOL 209 (H) 11/15/2019 10:29 AM   TRIG 73 11/15/2019 10:29 AM   HDL 87 11/15/2019 10:29 AM   CHOLHDL 2.4 11/15/2019 10:29 AM   CHOLHDL 2.8 09/13/2007 04:05 AM   LDLCALC 109 (H) 11/15/2019 10:29 AM    Wt Readings from Last 3 Encounters:  06/03/20 214 lb 3.2 oz (97.2 kg)  05/28/20 216 lb (98 kg)  04/01/20 210 lb (95.3 kg)     Exam:    Vital Signs:  BP 140/70   Temp (!) 97.3 F (36.3 C) (Oral)     Physical Exam Vitals and nursing note reviewed.  Pulmonary:     Effort: Pulmonary effort is normal.  Neurological:     Mental Status: She is oriented to person, place, and time.  Psychiatric:        Mood and Affect: Affect normal.     ASSESSMENT & PLAN:     There are no diagnoses linked to this encounter.   1) Sinus Congestion  -will go ahead do POC rapid Covid and flu test on patient- neg  -PCR sent waiting results  -Go ahead and prescribe her with antx due to her age and comorbidities - z-pak sent  -Educated patient to drink plenty of water and stay hydrated.  -Take OTC Corcidin as needed for congestion  -Take Flonase as needed   2) Cough  -Will send tessalon pearls to pharmacy  -Educated patient to take OTC delsym as needed.    3) Viral upper respiratory infection  -Will test patient for COVID and FLU  -Educated patient to take OTC pain relievers, hydrate with plenty of water  -Will send antx due to age and comorbidities  -Educated patient to take vitamin C,D and zinc   Follow up: If symptoms get worse, or if you experience any symptoms of chest pain, shortness of breath or pain the calf.   Advised patient to take Vitamin C, D, Zinc. Take baby aspirin daily. Keep yourself hydrated with a lot of water and rest. Take  Delsym for cough and Mucinex. Take Tylenol or pain reliever every 4-6 hours as needed for pain/fever/body ache. Educated patient if symptoms get worse or if she experiences any SOB or pain in her legs to seek immediate emergency care. Continue to monitor your pulse oxygen. Call us if you have any questions.    COVID-19 Education: The signs and symptoms of COVID-19 were discussed with the patient and how to seek care for testing (follow up with PCP or arrange E-visit).  The importance of social distancing was discussed today.  Patient Risk:   After full review of  this patients clinical status, I feel that they are at least moderate risk at this time.  Time:   Today, I have spent 15 minutes/ seconds with the patient with telehealth technology discussing above diagnoses.     Medication Adjustments/Labs and Tests Ordered: Current medicines are reviewed at length with the patient today.  Concerns regarding medicines are outlined above.   Tests Ordered: Orders Placed This Encounter  Procedures  . Novel Coronavirus, NAA ([facility])  . POC COVID-19  . POC Influenza A&B ([facility] test)    Medication Changes: Meds ordered this encounter  Medications  . DISCONTD: amoxicillin-clavulanate (AUGMENTIN) 875-125 MG tablet    Sig: Take 1 tablet by mouth 2 (two) times daily for 7 days.    Dispense:  14 tablet    Refill:  0  . benzonatate (TESSALON PERLES) 100 MG capsule    Sig: Take 1 capsule (100 mg total) by mouth every 6 (six) hours as needed for cough.    Dispense:  30 capsule    Refill:  1  . azithromycin (ZITHROMAX Z-PAK) 250 MG tablet    Sig: Take 2 tablets (500 mg) on  Day 1,  followed by 1 tablet (250 mg) once daily on Days 2 through 5.    Dispense:  6 each    Refill:  0    Disposition:  Follow up if symptoms get worse.  Signed, Bary Castilla, NP

## 2020-07-10 NOTE — Patient Instructions (Signed)
Sinusitis, Adult Sinusitis is inflammation of your sinuses. Sinuses are hollow spaces in the bones around your face. Your sinuses are located:  Around your eyes.  In the middle of your forehead.  Behind your nose.  In your cheekbones. Mucus normally drains out of your sinuses. When your nasal tissues become inflamed or swollen, mucus can become trapped or blocked. This allows bacteria, viruses, and fungi to grow, which leads to infection. Most infections of the sinuses are caused by a virus. Sinusitis can develop quickly. It can last for up to 4 weeks (acute) or for more than 12 weeks (chronic). Sinusitis often develops after a cold. What are the causes? This condition is caused by anything that creates swelling in the sinuses or stops mucus from draining. This includes:  Allergies.  Asthma.  Infection from bacteria or viruses.  Deformities or blockages in your nose or sinuses.  Abnormal growths in the nose (nasal polyps).  Pollutants, such as chemicals or irritants in the air.  Infection from fungi (rare). What increases the risk? You are more likely to develop this condition if you:  Have a weak body defense system (immune system).  Do a lot of swimming or diving.  Overuse nasal sprays.  Smoke. What are the signs or symptoms? The main symptoms of this condition are pain and a feeling of pressure around the affected sinuses. Other symptoms include:  Stuffy nose or congestion.  Thick drainage from your nose.  Swelling and warmth over the affected sinuses.  Headache.  Upper toothache.  A cough that may get worse at night.  Extra mucus that collects in the throat or the back of the nose (postnasal drip).  Decreased sense of smell and taste.  Fatigue.  A fever.  Sore throat.  Bad breath. How is this diagnosed? This condition is diagnosed based on:  Your symptoms.  Your medical history.  A physical exam.  Tests to find out if your condition is  acute or chronic. This may include: ? Checking your nose for nasal polyps. ? Viewing your sinuses using a device that has a light (endoscope). ? Testing for allergies or bacteria. ? Imaging tests, such as an MRI or CT scan. In rare cases, a bone biopsy may be done to rule out more serious types of fungal sinus disease. How is this treated? Treatment for sinusitis depends on the cause and whether your condition is chronic or acute.  If caused by a virus, your symptoms should go away on their own within 10 days. You may be given medicines to relieve symptoms. They include: ? Medicines that shrink swollen nasal passages (topical intranasal decongestants). ? Medicines that treat allergies (antihistamines). ? A spray that eases inflammation of the nostrils (topical intranasal corticosteroids). ? Rinses that help get rid of thick mucus in your nose (nasal saline washes).  If caused by bacteria, your health care provider may recommend waiting to see if your symptoms improve. Most bacterial infections will get better without antibiotic medicine. You may be given antibiotics if you have: ? A severe infection. ? A weak immune system.  If caused by narrow nasal passages or nasal polyps, you may need to have surgery. Follow these instructions at home: Medicines  Take, use, or apply over-the-counter and prescription medicines only as told by your health care provider. These may include nasal sprays.  If you were prescribed an antibiotic medicine, take it as told by your health care provider. Do not stop taking the antibiotic even if you start   to feel better. Hydrate and humidify  Drink enough fluid to keep your urine pale yellow. Staying hydrated will help to thin your mucus.  Use a cool mist humidifier to keep the humidity level in your home above 50%.  Inhale steam for 10-15 minutes, 3-4 times a day, or as told by your health care provider. You can do this in the bathroom while a hot shower is  running.  Limit your exposure to cool or dry air.   Rest  Rest as much as possible.  Sleep with your head raised (elevated).  Make sure you get enough sleep each night. General instructions  Apply a warm, moist washcloth to your face 3-4 times a day or as told by your health care provider. This will help with discomfort.  Wash your hands often with soap and water to reduce your exposure to germs. If soap and water are not available, use hand sanitizer.  Do not smoke. Avoid being around people who are smoking (secondhand smoke).  Keep all follow-up visits as told by your health care provider. This is important.   Contact a health care provider if:  You have a fever.  Your symptoms get worse.  Your symptoms do not improve within 10 days. Get help right away if:  You have a severe headache.  You have persistent vomiting.  You have severe pain or swelling around your face or eyes.  You have vision problems.  You develop confusion.  Your neck is stiff.  You have trouble breathing. Summary  Sinusitis is soreness and inflammation of your sinuses. Sinuses are hollow spaces in the bones around your face.  This condition is caused by nasal tissues that become inflamed or swollen. The swelling traps or blocks the flow of mucus. This allows bacteria, viruses, and fungi to grow, which leads to infection.  If you were prescribed an antibiotic medicine, take it as told by your health care provider. Do not stop taking the antibiotic even if you start to feel better.  Keep all follow-up visits as told by your health care provider. This is important. This information is not intended to replace advice given to you by your health care provider. Make sure you discuss any questions you have with your health care provider. Document Revised: 08/08/2017 Document Reviewed: 08/08/2017 Elsevier Patient Education  2021 [employer] Inc.  

## 2020-07-10 NOTE — Telephone Encounter (Signed)
The pt gave consent for a virtual appointment.

## 2020-07-11 ENCOUNTER — Telehealth: Payer: Self-pay | Admitting: Orthopaedic Surgery

## 2020-07-11 ENCOUNTER — Encounter: Payer: Self-pay | Admitting: Internal Medicine

## 2020-07-11 ENCOUNTER — Telehealth: Payer: Self-pay

## 2020-07-11 ENCOUNTER — Other Ambulatory Visit: Payer: Self-pay | Admitting: Physician Assistant

## 2020-07-11 LAB — NOVEL CORONAVIRUS, NAA: SARS-CoV-2, NAA: NOT DETECTED

## 2020-07-11 LAB — SARS-COV-2, NAA 2 DAY TAT

## 2020-07-11 MED ORDER — ACETAMINOPHEN-CODEINE #3 300-30 MG PO TABS: 1.0000 | ORAL_TABLET | Freq: Four times a day (QID) | ORAL | 0 refills | Status: DC | PRN

## 2020-07-11 NOTE — Telephone Encounter (Signed)
Left a detailed message.

## 2020-07-11 NOTE — Telephone Encounter (Signed)
Patient aware of the below message  

## 2020-07-11 NOTE — Telephone Encounter (Signed)
-----   Message from Bary Castilla, NP sent at 07/10/2020  5:00 PM EDT ----- Your rapid flu and covid test was neg.

## 2020-07-11 NOTE — Telephone Encounter (Signed)
Sent in tylenol 3.  If she needs something stronger, she will need to check with PCP

## 2020-07-11 NOTE — Telephone Encounter (Signed)
Pt states that the medication you prescribed her isn't really working for the pain. Wondering if you can prescribe her something different. [phone]

## 2020-07-14 ENCOUNTER — Telehealth: Payer: Self-pay

## 2020-07-14 NOTE — Chronic Care Management (AMB) (Signed)
    Chronic Care Management Pharmacy Assistant   Name: Leslie Davis  MRN: [id number] DOB: 07/13/1955  Reason for Encounter: Appointment Reminder Call    Recent office visits:  07/10/20- Ramandeep Ghumman(PCP  Recent consult visits:  None noted  Hospital visits:  None in previous 6 months  Medications: Outpatient Encounter Medications as of 07/14/2020  Medication Sig  . acetaminophen-codeine (TYLENOL #3) 300-30 MG tablet Take 1 tablet by mouth every 6 (six) hours as needed for moderate pain.  Marland Kitchen amLODipine (NORVASC) 10 MG tablet Take 1 tablet (10 mg total) by mouth daily.  Marland Kitchen aspirin EC 81 MG tablet Take 1 tablet (81 mg total) by mouth daily.  . Azilsartan Medoxomil (EDARBI) 80 MG TABS TAKE 1 TABLET EACH DAY.  Marland Kitchen azithromycin (ZITHROMAX Z-PAK) 250 MG tablet Take 2 tablets (500 mg) on  Day 1,  followed by 1 tablet (250 mg) once daily on Days 2 through 5.  . benzonatate (TESSALON PERLES) 100 MG capsule Take 1 capsule (100 mg total) by mouth every 6 (six) hours as needed for cough.  . Blood Glucose Monitoring Suppl (ONETOUCH VERIO) w/Device KIT Use as directed to check blood sugars 2 times per day dx: e11.65  . cetirizine (ZYRTEC) 10 MG tablet Take 1 tablet (10 mg total) by mouth daily.  . Cholecalciferol (VITAMIN D3) 1.25 MG (50000 UT) CAPS Take 1 capsule by mouth on Tuesday and Friday  . ferrous gluconate (FERGON) 324 MG tablet TAKE 1 TABLET BY MOUTH TWICE DAILY WITH A MEAL.  Marland Kitchen glucose blood (ONETOUCH VERIO) test strip Use as instructed to check blood sugars 2 times per day dx:e11.65  . insulin glargine, 1 Unit Dial, (TOUJEO SOLOSTAR) 300 UNIT/ML Solostar Pen Inject 50 units subcutaneous at bedtime  . insulin lispro (HUMALOG KWIKPEN) 100 UNIT/ML KwikPen Sliding scale  . Insulin Pen Needle 32G X 6 MM MISC Use with pen injectors daily dx code: e11.9  . Magnesium 400 MG TABS One tab po qpm  . OneTouch Delica Lancets 74U MISC Use as directed to check blood sugars 2 times per day  dx:e11.65  . pantoprazole (PROTONIX) 40 MG tablet Take one tablet by mouth 3 days a week  . rosuvastatin (CRESTOR) 20 MG tablet Take 1 tablet (20 mg total) by mouth daily.  . Semaglutide,0.25 or 0.5MG /DOS, (OZEMPIC, 0.25 OR 0.5 MG/DOSE,) 2 MG/1.5ML SOPN Inject 0.25 mg into the skin once a week.  . temazepam (RESTORIL) 30 MG capsule One capsules po qhs prn  . traMADol (ULTRAM) 50 MG tablet TAKE ONE TABLET BY MOUTH EVERY 6 HOURS AS NEEDED   No facility-administered encounter medications on file as of 07/14/2020.   Called patient to remind her of appointment on 07/15/20 at 11:00PM. Patient states she will be out of town. I have rescheduled patient for 07/22/20 at 3:00PM.   Lizbeth Bark Clinical Pharmacist Assistant [phone]

## 2020-07-15 ENCOUNTER — Telehealth: Payer: Self-pay

## 2020-07-15 NOTE — Chronic Care Management (AMB) (Signed)
07/15/2020- spoke with patient, appointment still scheduled for today at 10am but PharmD unavailable, patient was rescheduled yesterday to 07/22/2020 @ 3pm, Laverda Sorenson, scheduler notified to move appointment.  Pattricia Boss, [place]

## 2020-07-17 ENCOUNTER — Encounter: Payer: Self-pay | Admitting: Cardiology

## 2020-07-17 NOTE — Progress Notes (Signed)
Cardiology Office Note   Date:  07/18/2020   ID:  Na, Waldrip 1955-03-29, MRN [id number]  PCP:  Dorothyann Peng, MD  Cardiologist:   Rollene Rotunda, MD Referring:  Dorothyann Peng, MD  Chief Complaint  Patient presents with  . Pre-op Exam      History of Present Illness: Leslie Davis is a 65 y.o. female who is referred by .repro for preop clearance prior to knee surgery.  She has no past cardiac history other than some episodes of heart beating fast couple of years ago.  She was seen at [facility].  I was able to find a stress perfusion study in 2020 that was negative for ischemia.    She has never had any other cardiovascular symptoms that she can recall.  She denies any chest pressure, neck or arm discomfort.  She had no further palpitations, presyncope or syncope.  She does not have any shortness of breath, PND or orthopnea.  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  She does have some T wave inversions on inferior lateral leads but I see that these were present on previous EKGs as well.   Past Medical History:  Diagnosis Date  . DM (diabetes mellitus) (HCC)   . Fatty liver disease, nonalcoholic   . Gastritis   . Gastroparesis   . GERD (gastroesophageal reflux disease)   . Hiatal hernia   . HLD (hyperlipidemia)   . HTN (hypertension)   . Renal calculus     Past Surgical History:  Procedure Laterality Date  . CHOLECYSTECTOMY    . COLONOSCOPY    . TUBAL LIGATION    . UPPER GASTROINTESTINAL ENDOSCOPY       Current Outpatient Medications  Medication Sig Dispense Refill  . acetaminophen-codeine (TYLENOL #3) 300-30 MG tablet Take 1 tablet by mouth every 6 (six) hours as needed for moderate pain. 30 tablet 0  . amLODipine (NORVASC) 10 MG tablet Take 1 tablet (10 mg total) by mouth daily. 90 tablet 2  . aspirin EC 81 MG tablet Take 1 tablet (81 mg total) by mouth daily. 90 tablet 2  .  Azilsartan Medoxomil (EDARBI) 80 MG TABS TAKE 1 TABLET EACH DAY. 90 tablet 2  . benzonatate (TESSALON PERLES) 100 MG capsule Take 1 capsule (100 mg total) by mouth every 6 (six) hours as needed for cough. 30 capsule 1  . Blood Glucose Monitoring Suppl (ONETOUCH VERIO) w/Device KIT Use as directed to check blood sugars 2 times per day dx: e11.65 1 kit 1  . cetirizine (ZYRTEC) 10 MG tablet Take 1 tablet (10 mg total) by mouth daily. 90 tablet 2  . Cholecalciferol (VITAMIN D3) 1.25 MG (50000 UT) CAPS Take 1 capsule by mouth on Tuesday and Friday 24 capsule 1  . ferrous gluconate (FERGON) 324 MG tablet TAKE 1 TABLET BY MOUTH TWICE DAILY WITH A MEAL. 100 tablet 0  . glucose blood (ONETOUCH VERIO) test strip Use as instructed to check blood sugars 2 times per day dx:e11.65 150 each 3  . insulin glargine, 1 Unit Dial, (TOUJEO SOLOSTAR) 300 UNIT/ML Solostar Pen Inject 50 units subcutaneous at bedtime 22.5 mL 1  . insulin lispro (HUMALOG KWIKPEN) 100 UNIT/ML KwikPen Sliding scale 30 mL 2  . Insulin Pen Needle 32G X 6 MM MISC Use with pen injectors daily dx code: e11.9 150 each 3  . Magnesium 400 MG TABS One tab po  qpm 90 tablet 1  . OneTouch Delica Lancets 33G MISC Use as directed to check blood sugars 2 times per day dx:e11.65 150 each 3  . pantoprazole (PROTONIX) 40 MG tablet Take one tablet by mouth 3 days a week 90 tablet 2  . rosuvastatin (CRESTOR) 20 MG tablet Take 1 tablet (20 mg total) by mouth daily. 90 tablet 1  . Semaglutide,0.25 or 0.5MG /DOS, (OZEMPIC, 0.25 OR 0.5 MG/DOSE,) 2 MG/1.5ML SOPN Inject 0.25 mg into the skin once a week. 4.5 mL 2  . temazepam (RESTORIL) 30 MG capsule One capsules po qhs prn 60 capsule 0   No current facility-administered medications for this visit.    Allergies:   Fiasp [insulin aspart (w-niacinamide)]    Social History:  The patient  reports that she quit smoking about 45 years ago. She has a 0.25 pack-year smoking history. She has never used smokeless tobacco.  She reports that she does not drink alcohol and does not use drugs.   Family History:  The patient's family history includes Diabetes in her maternal grandmother and mother; Hypertension in her father; Stomach cancer in her maternal aunt.    ROS:  Please see the history of present illness.   Otherwise, review of systems are positive for none.   All other systems are reviewed and negative.    PHYSICAL EXAM: VS:  BP (!) 144/56 (BP Location: Left Arm, Patient Position: Sitting, Cuff Size: Large)   Pulse (!) 54   Ht 5\' 4"  (1.626 m)   Wt 212 lb (96.2 kg)   BMI 36.39 kg/m  , BMI Body mass index is 36.39 kg/m. GENERAL:  Well appearing HEENT:  Pupils equal round and reactive, fundi not visualized, oral mucosa unremarkable NECK:  No jugular venous distention, waveform within normal limits, carotid upstroke brisk and symmetric, no bruits, no thyromegaly LYMPHATICS:  No cervical, inguinal adenopathy LUNGS:  Clear to auscultation bilaterally BACK:  No CVA tenderness CHEST:  Unremarkable HEART:  PMI not displaced or sustained,S1 and S2 within normal limits, no S3, no S4, no clicks, no rubs, no murmurs ABD:  Flat, positive bowel sounds normal in frequency in pitch, no bruits, no rebound, no guarding, no midline pulsatile mass, no hepatomegaly, no splenomegaly EXT:  2 plus pulses throughout, no edema, no cyanosis no clubbing SKIN:  No rashes no nodules NEURO:  Cranial nerves II through XII grossly intact, motor grossly intact throughout PSYCH:  Cognitively intact, oriented to person place and time    EKG:  EKG is ordered today. The ekg ordered today demonstrates sinus bradycardia, rate 54, axis within normal limits, intervals within normal limits, no acute ST-T wave changes.   Recent Labs: 09/11/2019: Magnesium 2.6 11/15/2019: ALT 47; Hemoglobin 12.9; Platelets 287 02/21/2020: BUN 24; Creatinine, Ser 1.15; Potassium 4.7; Sodium 141    Lipid Panel    Component Value Date/Time   CHOL 209 (H)  11/15/2019 1029   TRIG 73 11/15/2019 1029   HDL 87 11/15/2019 1029   CHOLHDL 2.4 11/15/2019 1029   CHOLHDL 2.8 09/13/2007 0405   VLDL 17 09/13/2007 0405   LDLCALC 109 (H) 11/15/2019 1029      Wt Readings from Last 3 Encounters:  07/18/20 212 lb (96.2 kg)  06/03/20 214 lb 3.2 oz (97.2 kg)  05/28/20 216 lb (98 kg)      Other studies Reviewed: Additional studies/ records that were reviewed today include: Lexiscan Myoview 2020. Review of the above records demonstrates:  Please see elsewhere in the note.  ASSESSMENT AND PLAN:  PREOP CLEARANCE:    Patient has no high risk features.  She has an abnormal EKG but this is unchanged from previous.  She had a negative perfusion study about 2 years ago I was able to see these results.  She has a high functional level.  She has no chest pressure or other high risk symptoms.  She is not going for high risk surgery.  Therefore, according to ACC/AHA guidelines she is at acceptable risk for the planned procedure without further testing.  HTN: Her blood pressure is controlled.  No change in therapy  ABNORMAL EKG: I suspect these are some repolarization changes related to hypertension which is requiring multiple medications for management.  It is unchanged.  No change in therapy.  Current medicines are reviewed at length with the patient today.  The patient does not have concerns regarding medicines.  The following changes have been made:  no change  Labs/ tests ordered today include: None  Orders Placed This Encounter  Procedures  . EKG 12-Lead     Disposition:   FU with me as needed.     Signed, Minus Breeding, MD  07/18/2020 10:41 AM    [facility]

## 2020-07-18 ENCOUNTER — Other Ambulatory Visit: Payer: Self-pay

## 2020-07-18 ENCOUNTER — Telehealth: Payer: Self-pay

## 2020-07-18 ENCOUNTER — Encounter: Payer: Self-pay | Admitting: Cardiology

## 2020-07-18 ENCOUNTER — Ambulatory Visit (INDEPENDENT_AMBULATORY_CARE_PROVIDER_SITE_OTHER): Payer: Medicare Other | Admitting: Cardiology

## 2020-07-18 VITALS — BP 144/56 | HR 54 | Ht 64.0 in | Wt 212.0 lb

## 2020-07-18 DIAGNOSIS — I10 Essential (primary) hypertension: Secondary | ICD-10-CM

## 2020-07-18 DIAGNOSIS — Z01810 Encounter for preprocedural cardiovascular examination: Secondary | ICD-10-CM | POA: Diagnosis not present

## 2020-07-18 NOTE — Telephone Encounter (Signed)
I left a detailed message at the pt's request. 

## 2020-07-18 NOTE — Patient Instructions (Signed)
Medication Instructions:  Your physician recommends that you continue on your current medications as directed. Please refer to the Current Medication list given to you today.  *If you need a refill on your cardiac medications before your next appointment, please call your pharmacy*  Lab Work: NONE ordered at this time of appointment   If you have labs (blood work) drawn today and your tests are completely normal, you will receive your results only by: Marland Kitchen MyChart Message (if you have MyChart) OR . A paper copy in the mail If you have any lab test that is abnormal or we need to change your treatment, we will call you to review the results.  Testing/Procedures: NONE ordered at this time of appointment   Follow-Up: At [facility], you and your health needs are our priority.  As part of our continuing mission to provide you with exceptional heart care, we have created designated Provider Care Teams.  These Care Teams include your primary Cardiologist (physician) and Advanced Practice Providers (APPs -  Physician Assistants and Nurse Practitioners) who all work together to provide you with the care you need, when you need it.  We recommend signing up for the patient portal called "MyChart".  Sign up information is provided on this After Visit Summary.  MyChart is used to connect with patients for Virtual Visits (Telemedicine).  Patients are able to view lab/test results, encounter notes, upcoming appointments, etc.  Non-urgent messages can be sent to your provider as well.   To learn more about what you can do with MyChart, go to [email or web address].    Your next appointment:   As Needed    The format for your next appointment:   In Person  Provider:   You may see Minus Breeding, MD or one of the following Advanced Practice Providers on your designated Care Team:    Rosaria Ferries, PA-C  Jory Sims, DNP, ANP   Other Instructions

## 2020-07-18 NOTE — Telephone Encounter (Signed)
Your Covid test was negative. How are you feeling now? Let us know if you have any questions.  Patient returned my call regarding labs. She stated she is feeling better than she was. YL,RMA

## 2020-07-18 NOTE — Telephone Encounter (Signed)
-----   Message from Bary Castilla, NP sent at 07/15/2020  3:03 PM EDT ----- Your Covid test was negative. How are you feeling now? Let us know if you have any questions.

## 2020-07-21 ENCOUNTER — Telehealth: Payer: Self-pay

## 2020-07-21 NOTE — Progress Notes (Signed)
Left patient voicemail with appointment reminder with [name] CPP on 07-22-20 at 3pm and to have all meds/supplements and documented logs near for review.  [place]  [phone]

## 2020-07-22 ENCOUNTER — Ambulatory Visit: Payer: Medicare Other | Admitting: Internal Medicine

## 2020-07-22 ENCOUNTER — Telehealth: Payer: Self-pay

## 2020-07-23 ENCOUNTER — Encounter (INDEPENDENT_AMBULATORY_CARE_PROVIDER_SITE_OTHER): Payer: Self-pay | Admitting: Family Medicine

## 2020-07-23 ENCOUNTER — Other Ambulatory Visit: Payer: Self-pay

## 2020-07-23 ENCOUNTER — Ambulatory Visit (INDEPENDENT_AMBULATORY_CARE_PROVIDER_SITE_OTHER): Payer: Medicare Other | Admitting: Family Medicine

## 2020-07-23 VITALS — BP 165/62 | HR 56 | Temp 97.8°F | Ht 64.0 in | Wt 208.0 lb

## 2020-07-23 DIAGNOSIS — I152 Hypertension secondary to endocrine disorders: Secondary | ICD-10-CM

## 2020-07-23 DIAGNOSIS — Z6835 Body mass index (BMI) 35.0-35.9, adult: Secondary | ICD-10-CM

## 2020-07-23 DIAGNOSIS — D508 Other iron deficiency anemias: Secondary | ICD-10-CM

## 2020-07-23 DIAGNOSIS — Z1331 Encounter for screening for depression: Secondary | ICD-10-CM

## 2020-07-23 DIAGNOSIS — R5383 Other fatigue: Secondary | ICD-10-CM | POA: Insufficient documentation

## 2020-07-23 DIAGNOSIS — E785 Hyperlipidemia, unspecified: Secondary | ICD-10-CM

## 2020-07-23 DIAGNOSIS — Z794 Long term (current) use of insulin: Secondary | ICD-10-CM

## 2020-07-23 DIAGNOSIS — Z0289 Encounter for other administrative examinations: Secondary | ICD-10-CM

## 2020-07-23 DIAGNOSIS — Z9189 Other specified personal risk factors, not elsewhere classified: Secondary | ICD-10-CM | POA: Insufficient documentation

## 2020-07-23 DIAGNOSIS — E1169 Type 2 diabetes mellitus with other specified complication: Secondary | ICD-10-CM

## 2020-07-23 DIAGNOSIS — E119 Type 2 diabetes mellitus without complications: Secondary | ICD-10-CM | POA: Insufficient documentation

## 2020-07-23 DIAGNOSIS — E559 Vitamin D deficiency, unspecified: Secondary | ICD-10-CM

## 2020-07-23 DIAGNOSIS — R6889 Other general symptoms and signs: Secondary | ICD-10-CM | POA: Diagnosis not present

## 2020-07-23 DIAGNOSIS — E1159 Type 2 diabetes mellitus with other circulatory complications: Secondary | ICD-10-CM

## 2020-07-23 DIAGNOSIS — R0602 Shortness of breath: Secondary | ICD-10-CM

## 2020-07-23 DIAGNOSIS — D649 Anemia, unspecified: Secondary | ICD-10-CM | POA: Insufficient documentation

## 2020-07-24 LAB — CBC WITH DIFFERENTIAL/PLATELET
Basophils Absolute: 0 10*3/uL (ref 0.0–0.2)
Basos: 1 %
EOS (ABSOLUTE): 0.2 10*3/uL (ref 0.0–0.4)
Eos: 4 %
Hematocrit: 38.6 % (ref 34.0–46.6)
Hemoglobin: 12.7 g/dL (ref 11.1–15.9)
Immature Grans (Abs): 0 10*3/uL (ref 0.0–0.1)
Immature Granulocytes: 0 %
Lymphocytes Absolute: 1.8 10*3/uL (ref 0.7–3.1)
Lymphs: 40 %
MCH: 28.1 pg (ref 26.6–33.0)
MCHC: 32.9 g/dL (ref 31.5–35.7)
MCV: 85 fL (ref 79–97)
Monocytes Absolute: 0.6 10*3/uL (ref 0.1–0.9)
Monocytes: 12 %
Neutrophils Absolute: 1.9 10*3/uL (ref 1.4–7.0)
Neutrophils: 43 %
Platelets: 290 10*3/uL (ref 150–450)
RBC: 4.52 x10E6/uL (ref 3.77–5.28)
RDW: 13.7 % (ref 11.7–15.4)
WBC: 4.6 10*3/uL (ref 3.4–10.8)

## 2020-07-24 LAB — COMPREHENSIVE METABOLIC PANEL
ALT: 45 IU/L — ABNORMAL HIGH (ref 0–32)
AST: 37 IU/L (ref 0–40)
Albumin/Globulin Ratio: 1.5 (ref 1.2–2.2)
Albumin: 4.1 g/dL (ref 3.8–4.8)
Alkaline Phosphatase: 149 IU/L — ABNORMAL HIGH (ref 44–121)
BUN/Creatinine Ratio: 18 (ref 12–28)
BUN: 18 mg/dL (ref 8–27)
Bilirubin Total: 0.2 mg/dL (ref 0.0–1.2)
CO2: 24 mmol/L (ref 20–29)
Calcium: 9.4 mg/dL (ref 8.7–10.3)
Chloride: 102 mmol/L (ref 96–106)
Creatinine, Ser: 1 mg/dL (ref 0.57–1.00)
Globulin, Total: 2.7 g/dL (ref 1.5–4.5)
Glucose: 88 mg/dL (ref 65–99)
Potassium: 4.4 mmol/L (ref 3.5–5.2)
Sodium: 140 mmol/L (ref 134–144)
Total Protein: 6.8 g/dL (ref 6.0–8.5)
eGFR: 62 mL/min/{1.73_m2} (ref >59)

## 2020-07-24 LAB — VITAMIN B12: Vitamin B-12: 711 pg/mL (ref 232–1245)

## 2020-07-24 LAB — T3: T3, Total: 133 ng/dL (ref 71–180)

## 2020-07-24 LAB — LIPID PANEL
Chol/HDL Ratio: 2 ratio (ref 0.0–4.4)
Cholesterol, Total: 179 mg/dL (ref 100–199)
HDL: 88 mg/dL (ref >39)
LDL Chol Calc (NIH): 74 mg/dL (ref 0–99)
Triglycerides: 99 mg/dL (ref 0–149)
VLDL Cholesterol Cal: 17 mg/dL (ref 5–40)

## 2020-07-24 LAB — FOLATE: Folate: 11.5 ng/mL (ref >3.0)

## 2020-07-24 LAB — T4, FREE: Free T4: 1 ng/dL (ref 0.82–1.77)

## 2020-07-24 LAB — VITAMIN D 25 HYDROXY (VIT D DEFICIENCY, FRACTURES): Vit D, 25-Hydroxy: 112 ng/mL — ABNORMAL HIGH (ref 30.0–100.0)

## 2020-07-24 LAB — TSH: TSH: 1.79 u[IU]/mL (ref 0.450–4.500)

## 2020-07-30 NOTE — Progress Notes (Signed)
Dear Dr. Erlinda Davis,   Thank you for referring Leslie Davis to our clinic. The following note includes my evaluation and treatment recommendations.  Chief Complaint:   Leslie Davis (MR# [id number]) is a 65 y.o. female who presents for evaluation and treatment of Leslie and related comorbidities. Current BMI is Body mass index is 35.7 kg/m. Leslie Davis has been struggling with her weight for many years and has been unsuccessful in either losing weight, maintaining weight loss, or reaching her healthy weight goal.  Leslie Davis is currently in the action stage of change and ready to dedicate time achieving and maintaining a healthier weight. Leslie Davis is interested in becoming our patient and working on intensive lifestyle modifications including (but not limited to) diet and exercise for weight loss.    Leslie Davis was sent here by Dr. Erlinda Davis of Orthopedics for prep prior to left knee surgery.  She lives with her 2 sons, her 44 grandchildren, and her 2 great grandchildren.  Also lives with her husband, Leslie Davis.  Works 2 hours per week in [employer].  Snacks on ice cream, soda, and chocolate.  Leslie Davis's habits were reviewed today and are as follows: Her family eats meals together, she thinks her family will eat healthier with her, her desired weight loss is 37 pounds, she started gaining weight after having her last child, her heaviest weight ever was her current weight, she is a picky eater and doesn't like to eat healthier foods, she craves chocolate and soda, she snacks frequently in the evenings, she skips lunch frequently, she is frequently drinking liquids with calories and she struggles with emotional eating.  Depression Screen [facility]'s Food and Mood (modified PHQ-9) score was 11.  Depression screen PHQ 2/9 07/23/2020  Decreased Interest 2  Down, Depressed, Hopeless 2  PHQ - 2 Score 4  Altered sleeping 0  Tired, decreased energy 1  Change in appetite 2  Feeling bad or failure  about yourself  2  Trouble concentrating 2  Moving slowly or fidgety/restless 0  Suicidal thoughts 0  PHQ-9 Score 11  Difficult doing work/chores Not difficult at all  Some recent data might be hidden   Assessment/Plan:   Orders Placed This Encounter  Procedures  . Vitamin B12  . CBC with Differential/Platelet  . Comprehensive metabolic panel  . Folate  . Lipid panel  . T3  . T4, free  . TSH  . VITAMIN D 25 Hydroxy (Vit-D Deficiency, Fractures)    There are no discontinued medications.   No orders of the defined types were placed in this encounter.   1. Other fatigue Leslie Davis denies daytime somnolence and denies waking up still tired. Leslie Davis generally gets 8 hours of sleep per night, and states that she has generally restful sleep. Snoring is not present. Apneic episodes are not present. Epworth Sleepiness Score is 5.  Leslie Davis does feel that her weight is causing her energy to be lower than it should be. Fatigue may be related to Leslie, depression or many other causes. Labs will be ordered, and in the meanwhile, Leslie Davis will focus on self care including making healthy food choices, increasing physical activity and focusing on stress reduction.  Will check labs today, as per below.  - CBC with Differential/Platelet - Comprehensive metabolic panel - T3 - T4, free - TSH  2. SOBOE (shortness of breath on exertion) Leslie Davis notes increasing shortness of breath with exercising and seems to be worsening over time with weight gain. She notes getting out of  breath sooner with activity than she used to. This has gotten worse recently. Leslie Davis denies shortness of breath at rest or orthopnea.  Leslie Davis does feel that she gets out of breath more easily that she used to when she exercises. Leslie Davis's shortness of breath appears to be Leslie related and exercise induced. She has agreed to work on weight loss and gradually increase exercise to treat her exercise induced shortness of breath.  Will continue to monitor closely.  Will check IC and labs today.  - CBC with Differential/Platelet - Comprehensive metabolic panel - T3 - T4, free - TSH  3. Type 2 diabetes mellitus with other specified complication, with long-term current use of insulin (HCC) Diabetes Mellitus: Not at goal. Medication:  Humalog/lispro, Toujeo 50 units at bedtime, Ozempic 0.25 mg subcutaneously weekly. Issues reviewed: blood sugar goals, complications of diabetes mellitus, hypoglycemia prevention and treatment, exercise, and nutrition.  Treatment by PCP.  Diagnosed 20-30 years ago.  Recent FBS 81-155.  Last A1c was 8.7 on 05/28/2020.  Plan:  Discussed labs with patient today. A1c not at goal of <8.0.  Continue medications per PCP.  Check labs.  Follow post-prandial and weight loss.  Close monitoring. The importance of regular follow up with PCP and all other specialists as scheduled was stressed to patient today.  Lab Results  Component Value Date   HGBA1C 8.7 (H) 05/28/2020   HGBA1C 8.8 (H) 02/21/2020   HGBA1C 10.5 (H) 11/15/2019   Lab Results  Component Value Date   MICROALBUR 150 11/15/2019   LDLCALC 74 07/23/2020   CREATININE 1.00 07/23/2020   - Comprehensive metabolic panel  4. Hypertension associated with type 2 diabetes mellitus ([place]) Not at goal. Medications: Norvasc 10 mg daily, Edarbi 80 mg daily.  Blood pressure at home is 148/66, and per patient, that is where it typically stays.  Asymptomatic.  No concerns today.  Plan: Avoid buying foods that are: processed, frozen, or prepackaged to avoid excess salt.  Bring me a log of blood pressures from home to your next office visit.  Take all medications as prescribed.  Follow prudent nutritional plan. Ambulatory blood pressure monitoring was encouraged with a goal of at least 2-3 times weekly or when feeling poorly.  She was instructed to keep a log for [place] to review at each office visit.   We will continue to monitor closely alongside her PCP and/or  Specialist.  Regular follow up with PCP and specialists was also encouraged.   BP Readings from Last 3 Encounters:  07/23/20 (!) 165/62  07/18/20 (!) 144/56  07/10/20 140/70   Lab Results  Component Value Date   CREATININE 1.00 07/23/2020   - CBC with Differential/Platelet - Comprehensive metabolic panel  5. Hyperlipidemia associated with type 2 diabetes mellitus ([place]) Course: Controlled. Lipid-lowering medications: Crestor 20 mg daily.   Plan: Continue Crestor.  Dietary changes: Increase soluble fiber, decrease simple carbohydrates, decrease saturated fat. Exercise changes: Moderate to vigorous-intensity aerobic activity 150 minutes per week or as tolerated. We will continue to monitor along with PCP/specialists as it pertains to her weight loss journey.  Check FLP today.  Lab Results  Component Value Date   CHOL 179 07/23/2020   HDL 88 07/23/2020   LDLCALC 74 07/23/2020   TRIG 99 07/23/2020   CHOLHDL 2.0 07/23/2020   Lab Results  Component Value Date   ALT 45 (H) 07/23/2020   AST 37 07/23/2020   ALKPHOS 149 (H) 07/23/2020   BILITOT 0.2 07/23/2020   The 10-year ASCVD  risk score Mikey Bussing DC Brooke Bonito., et al., 2013) is: 30.7%   Values used to calculate the score:     Age: 50 years     Sex: Female     Is Non-Hispanic African American: Yes     Diabetic: Yes     Tobacco smoker: No     Systolic Blood Pressure: 536 mmHg     Is BP treated: Yes     HDL Cholesterol: 88 mg/dL     Total Cholesterol: 179 mg/dL  - Lipid panel  6. Other iron deficiency anemia Leslie Davis is taking ferrous gluconate 324 mg daily.  Asymptomatic.  No concerns.  Tolerating supplement well.  Plan:  Continue taking your daily iron supplement and we will check vitamin B12 and folate levels today.  Nutrition: Iron-rich foods include dark leafy greens, red and white meats, eggs, seafood, and beans.  Certain foods and drinks prevent your body from absorbing iron properly. Avoid eating these foods in the same meal as  iron-rich foods or with iron supplements. These foods include: coffee, black tea, and red wine; milk, dairy products, and foods that are high in calcium; beans and soybeans; whole grains. Constipation can be a side effect of iron supplementation. Increased water and fiber intake are helpful. Water goal: > 2 liters/day. Fiber goal: > 25 grams/day.  CBC Latest Ref Rng & Units 07/23/2020 11/15/2019 10/06/2019  WBC 3.4 - 10.8 x10E3/uL 4.6 5.3 9.4  Hemoglobin 11.1 - 15.9 g/dL 12.7 12.9 12.1  Hematocrit 34.0 - 46.6 % 38.6 38.9 36.5  Platelets 150 - 450 x10E3/uL 290 287 271   Lab Results  Component Value Date   IRON 68 11/16/2017   TIBC 355 11/16/2017   FERRITIN 45.2 11/16/2017   Lab Results  Component Value Date   VITAMINB12 711 07/23/2020   - Vitamin B12 - Folate  7. Vitamin D deficiency Not at goal. Current vitamin D is 24.3, tested on 11/15/2019. Optimal goal > 50 ng/dL. She is taking vitamin D 50,000 IU biweekly.  Plan: Continue to take prescription Vitamin D @50 ,000 IU biweekly.  Will check vitamin D level today.  - VITAMIN D 25 Hydroxy (Vit-D Deficiency, Fractures)  8. Depression screening Leslie Davis was screened for depression as part of her new patient workup today.  PHQ-9 is 11.  She denies depressed mood or that it is a concern for her today.  Leslie Davis had a positive depression screening. Depression is commonly associated with Leslie and often results in emotional eating behaviors. We will monitor this closely and work on CBT to help improve the non-hunger eating patterns. Referral to Psychology may be required if no improvement is seen as she continues in our clinic.  9. Class 2 severe Leslie with serious comorbidity and body mass index (BMI) of 35.0 to 35.9 in adult, unspecified Leslie type (HCC)  Leslie Davis is currently in the action stage of change and her goal is to continue with weight loss efforts. I recommend Leslie Davis begin the structured treatment plan as follows:  She has  agreed to the Category 1 Plan.  Exercise goals: As is.   Behavioral modification strategies: increasing lean protein intake, decreasing simple carbohydrates, decreasing liquid calories, decreasing sodium intake, meal planning and cooking strategies and planning for success.  She was informed of the importance of frequent follow-up visits to maximize her success with intensive lifestyle modifications for her multiple health conditions. She was informed we would discuss her lab results at her next visit unless there is a critical issue that needs  to be addressed sooner. Leslie Davis agreed to keep her next visit at the agreed upon time to discuss these results.  Objective:   Blood pressure (!) 165/62, pulse (!) 56, temperature 97.8 F (36.6 C), height 5\' 4"  (1.626 m), weight 208 lb (94.3 kg), SpO2 97 %. Body mass index is 35.7 kg/m.  Indirect Calorimeter completed today shows a VO2 of 224 and a REE of 1562.  Her calculated basal metabolic rate is 123456 thus her basal metabolic rate is better than expected.  General: Cooperative, alert, well developed, in no acute distress. HEENT: Conjunctivae and lids unremarkable. Cardiovascular: Regular rhythm.  Lungs: Normal work of breathing. Neurologic: No focal deficits.   Lab Results  Component Value Date   CREATININE 1.00 07/23/2020   BUN 18 07/23/2020   NA 140 07/23/2020   K 4.4 07/23/2020   CL 102 07/23/2020   CO2 24 07/23/2020   Lab Results  Component Value Date   ALT 45 (H) 07/23/2020   AST 37 07/23/2020   ALKPHOS 149 (H) 07/23/2020   BILITOT 0.2 07/23/2020   Lab Results  Component Value Date   HGBA1C 8.7 (H) 05/28/2020   HGBA1C 8.8 (H) 02/21/2020   HGBA1C 10.5 (H) 11/15/2019   HGBA1C 9.6 (H) 08/13/2019   HGBA1C 8.4 (H) 05/16/2019   Lab Results  Component Value Date   TSH 1.790 07/23/2020   Lab Results  Component Value Date   CHOL 179 07/23/2020   HDL 88 07/23/2020   LDLCALC 74 07/23/2020   TRIG 99 07/23/2020   CHOLHDL 2.0  07/23/2020   Lab Results  Component Value Date   WBC 4.6 07/23/2020   HGB 12.7 07/23/2020   HCT 38.6 07/23/2020   MCV 85 07/23/2020   PLT 290 07/23/2020   Lab Results  Component Value Date   IRON 68 11/16/2017   TIBC 355 11/16/2017   FERRITIN 45.2 11/16/2017   Leslie Behavioral Intervention:   Approximately 15 minutes were spent on the discussion below.  ASK: We discussed the diagnosis of Leslie with Leslie Davis today and Leslie Davis agreed to give [place] permission to discuss Leslie behavioral modification therapy today.  ASSESS: Cosandra has the diagnosis of Leslie and her BMI today is 35.7. Sharai is in the action stage of change.   ADVISE: Varonica was educated on the multiple health risks of Leslie as well as the benefit of weight loss to improve her health. She was advised of the need for long term treatment and the importance of lifestyle modifications to improve her current health and to decrease her risk of future health problems.  AGREE: Multiple dietary modification options and treatment options were discussed and Leslie Davis agreed to follow the recommendations documented in the above note.  ARRANGE: Leslie Davis was educated on the importance of frequent visits to treat Leslie as outlined per CMS and USPSTF guidelines and agreed to schedule her next follow up appointment today.  Attestation Statements:   This is the patient's first visit at [facility]. The patient's NEW PATIENT PACKET was reviewed at length. Included in the packet: current and past health history, medications, allergies, ROS, gynecologic history (women only), surgical history, family history, social history, weight history, weight loss surgery history (for those that have had weight loss surgery), nutritional evaluation, mood and food questionnaire, PHQ9, Epworth questionnaire, sleep habits questionnaire, patient life and health improvement goals questionnaire. These will all be scanned into the  patient's chart under media.   During the visit, I independently reviewed the patient's EKG, bioimpedance  scale results, and indirect calorimeter results. I used this information to tailor a meal plan for the patient that will help her to lose weight and will improve her Leslie-related conditions going forward. I performed a medically necessary appropriate examination and/or evaluation. I discussed the assessment and treatment plan with the patient. The patient was provided an opportunity to ask questions and all were answered. The patient agreed with the plan and demonstrated an understanding of the instructions. Labs were ordered at this visit and will be reviewed at the next visit unless more critical results need to be addressed immediately. Clinical information was updated and documented in the EMR.   I, Water quality scientist, CMA, am acting as Location manager for [employer], DO.  I have reviewed the above documentation for accuracy and completeness, and I agree with the above. Marjory Sneddon, D.O.  The [place] was signed into law in 2016 which includes the topic of electronic health records.  This provides immediate access to information in MyChart.  This includes consultation notes, operative notes, office notes, lab results and pathology reports.  If you have any questions about what you read please let us know at your next visit so we can discuss your concerns and take corrective action if need be.  We are right here with you.

## 2020-08-06 ENCOUNTER — Ambulatory Visit (INDEPENDENT_AMBULATORY_CARE_PROVIDER_SITE_OTHER): Payer: Medicare Other | Admitting: Family Medicine

## 2020-08-06 ENCOUNTER — Encounter (INDEPENDENT_AMBULATORY_CARE_PROVIDER_SITE_OTHER): Payer: Self-pay | Admitting: Family Medicine

## 2020-08-06 ENCOUNTER — Other Ambulatory Visit: Payer: Self-pay

## 2020-08-06 VITALS — BP 140/80 | HR 50 | Temp 97.5°F | Ht 64.0 in | Wt 203.0 lb

## 2020-08-06 DIAGNOSIS — K760 Fatty (change of) liver, not elsewhere classified: Secondary | ICD-10-CM | POA: Insufficient documentation

## 2020-08-06 DIAGNOSIS — E1169 Type 2 diabetes mellitus with other specified complication: Secondary | ICD-10-CM | POA: Diagnosis not present

## 2020-08-06 DIAGNOSIS — E559 Vitamin D deficiency, unspecified: Secondary | ICD-10-CM | POA: Diagnosis not present

## 2020-08-06 DIAGNOSIS — Z794 Long term (current) use of insulin: Secondary | ICD-10-CM

## 2020-08-06 DIAGNOSIS — Z6835 Body mass index (BMI) 35.0-35.9, adult: Secondary | ICD-10-CM

## 2020-08-06 DIAGNOSIS — E1159 Type 2 diabetes mellitus with other circulatory complications: Secondary | ICD-10-CM

## 2020-08-06 DIAGNOSIS — E785 Hyperlipidemia, unspecified: Secondary | ICD-10-CM | POA: Diagnosis not present

## 2020-08-06 DIAGNOSIS — I152 Hypertension secondary to endocrine disorders: Secondary | ICD-10-CM | POA: Diagnosis not present

## 2020-08-06 MED ORDER — TOUJEO SOLOSTAR 300 UNIT/ML ~~LOC~~ SOPN: PEN_INJECTOR | SUBCUTANEOUS | 1 refills | Status: DC

## 2020-08-06 MED ORDER — VITAMIN D3 1.25 MG (50000 UT) PO CAPS: ORAL_CAPSULE | ORAL | 0 refills | Status: DC

## 2020-08-06 MED ORDER — OZEMPIC (1 MG/DOSE) 4 MG/3ML ~~LOC~~ SOPN
1.0000 mg | PEN_INJECTOR | SUBCUTANEOUS | 0 refills | Status: DC
Start: 2020-08-06 — End: 2020-09-01

## 2020-08-12 NOTE — Progress Notes (Signed)
Chief Complaint:   OBESITY Leslie Davis is here to discuss her progress with her obesity treatment plan along with follow-up of her obesity related diagnoses.   Today's visit was #: 2 Starting weight: 208 lbs Starting date: 07/23/2020 Today's weight: 203 lbs Today's date: 08/06/2020 Weight change since last visit: 5 lbs Total lbs lost to date: 5 lbs Body mass index is 34.84 kg/m.  Total weight loss percentage to date: -2.40%  Interim History:  Leslie Davis [place] is here today for her first follow-up office visit since starting the program with [place].  All blood work/ lab tests that were recently ordered by myself or an outside provider were reviewed with patient today per their request.   Extended time was spent counseling her on all new disease processes that were discovered or preexisting ones that are worsening.  she understands that many of these abnormalities will need to monitored regularly along with the current treatment plan of prudent dietary changes, in which we are making each and every office visit, to improve these health parameters.  We reviewed her new meal plan in detail and questions were answered.  Patient's food recall appears to be accurate and consistent with what is on plan when she is following it.   When eating on plan, her hunger and cravings are well controlled.    Zaydah says she has no hunger, but has some emotional cravings and it is difficult to break her snacking habits.  Craving [employer] ice cream and chocolate, which is emotionally mediated.  Plan:  Emotional eating strategies discussed with her today.  Handouts given for 10 minute emotional eating exercises.  Declines referral to Dr. Mallie Mussel for now.  Current Meal Plan: the Category 1 Plan for 95% of the time.  Current Exercise Plan: None. Current Anti-Obesity Medications: Ozempic 0.5 mg subcutaneously weekly. Side effects: None.  Assessment/Plan:   Medications Discontinued During This Encounter   Medication Reason  . insulin glargine, 1 Unit Dial, (TOUJEO SOLOSTAR) 300 UNIT/ML Solostar Pen   . Cholecalciferol (VITAMIN D3) 1.25 MG (50000 UT) CAPS   . Semaglutide,0.25 or 0.5MG /DOS, (OZEMPIC, 0.25 OR 0.5 MG/DOSE,) 2 MG/1.5ML SOPN Dose change   Meds ordered this encounter  Medications  . insulin glargine, 1 Unit Dial, (TOUJEO SOLOSTAR) 300 UNIT/ML Solostar Pen    Sig: Inject 45 units subcutaneous at bedtime    Dispense:  22.5 mL    Refill:  1    Med RF per Dr Baird Cancer  . Cholecalciferol (VITAMIN D3) 1.25 MG (50000 UT) CAPS    Sig: Take 1 capsule by mouth every 2 wks    Dispense:  2 capsule    Refill:  0  . Semaglutide, 1 MG/DOSE, (OZEMPIC, 1 MG/DOSE,) 4 MG/3ML SOPN    Sig: Inject 1 mg into the skin once a week.    Dispense:  3 mL    Refill:  0    1. NAFLD (nonalcoholic fatty liver disease) NAFLD is an umbrella term that encompasses a disease spectrum that includes steatosis (fat) without inflammation, steatohepatitis (NASH; fat + inflammation in a characteristic pattern), and cirrhosis. Bland steatosis is felt to be a benign condition, with extremely low to no risk of progression to cirrhosis, whereas NASH can progress to cirrhosis. The mainstay of treatment of NAFLD includes lifestyle modification to achieve weight loss, at least 7% of current body weight. Low carbohydrate diets can be beneficial in improving NAFLD liver histology. Additionally, exercise, even the absence of weight loss can have beneficial effects  on the patient's metabolic profile and liver health.   Leslie Davis is taking Ozempic.  Plan:  Discussed labs with patient today.  ALT is stable.  Increase Ozempic dose today.  2. Type 2 diabetes mellitus with other specified complication, with long-term current use of insulin (HCC) Diabetes Mellitus: Not at goal. Medication: Ozempic 0.5 mg subcutaneously weekly, Toujeo 50 units at bedtime, and Humalog sliding scale (never uses). Issues reviewed: blood sugar goals,  complications of diabetes mellitus, hypoglycemia prevention and treatment, exercise, and nutrition.  FBS 98, 74, 70.  Highest was 138.  Last A1c 8.7 at last check.  Plan:  Discussed labs with patient today.  Decrease Toujeo to 45 units at night (down from 50).  Recheck A1c after 3 months on prudent nutritional plan with weight loss.  Increase Ozempic from 0.5 mg to 1 mg subcutaneously weekly.  The importance of regular follow up with PCP and all other specialists as scheduled was stressed to patient today. The patient will continue to focus on protein-rich, low simple carbohydrate foods. We reviewed the importance of hydration, regular exercise for stress reduction, and restorative sleep.   Lab Results  Component Value Date   HGBA1C 8.7 (H) 05/28/2020   HGBA1C 8.8 (H) 02/21/2020   HGBA1C 10.5 (H) 11/15/2019   Lab Results  Component Value Date   MICROALBUR 150 11/15/2019   LDLCALC 74 07/23/2020   CREATININE 1.00 07/23/2020   - Decrease insulin glargine, 1 Unit Dial, (TOUJEO SOLOSTAR) 300 UNIT/ML Solostar Pen; Inject 45 units subcutaneous at bedtime  Dispense: 22.5 mL; Refill: 1 - Increase and refill Semaglutide, 1 MG/DOSE, (OZEMPIC, 1 MG/DOSE,) 4 MG/3ML SOPN; Inject 1 mg into the skin once a week.  Dispense: 3 mL; Refill: 0  3. Hypertension associated with type 2 diabetes mellitus ([place]) At goal. Medications: Norvasc 10 mg daily, Edarbi 80 mg daily.  Much improved from prior office visit.  At home, running 140s/70s.  Plan:  Discussed labs with patient today.  Avoid buying foods that are: processed, frozen, or prepackaged to avoid excess salt. We will watch for signs of hypotension as she continues lifestyle modifications. We will continue to monitor closely alongside her PCP and/or Specialist.  Regular follow up with PCP and specialists was also encouraged.   BP Readings from Last 3 Encounters:  08/25/20 124/76  08/06/20 140/80  07/23/20 (!) 165/62   Lab Results  Component Value Date    CREATININE 1.00 07/23/2020   4. Vitamin D deficiency Above goal.  Current vitamin D is 112.0, tested on 07/23/2020. Optimal goal > 50 ng/dL.  Per PCP, she was on 50,000 IU twice weekly.  Plan:  Worsening.  Discussed labs with patient today.  Vitamin D level too high.  Change to 50,000 IU every 14 days.  - Change to Cholecalciferol (VITAMIN D3) 1.25 MG (50000 UT) CAPS; Take 1 capsule by mouth every 2 wks  Dispense: 2 capsule; Refill: 0  5. Hyperlipidemia associated with type 2 diabetes mellitus (HCC) Course: Lipid-lowering medications: Crestor 20 mg daily.  No issues with medication.  Plan:  At goal.  Discussed labs with patient today.  Dietary changes: Increase soluble fiber, decrease simple carbohydrates, decrease saturated fat. Exercise changes: Moderate to vigorous-intensity aerobic activity 150 minutes per week or as tolerated. We will continue to monitor along with PCP/specialists as it pertains to her weight loss journey.  Lab Results  Component Value Date   CHOL 179 07/23/2020   HDL 88 07/23/2020   LDLCALC 74 07/23/2020   TRIG 99  07/23/2020   CHOLHDL 2.0 07/23/2020   Lab Results  Component Value Date   ALT 45 (H) 07/23/2020   AST 37 07/23/2020   ALKPHOS 149 (H) 07/23/2020   BILITOT 0.2 07/23/2020   The 10-year ASCVD risk score Mikey Bussing DC Jr., et al., 2013) is: 17.1%   Values used to calculate the score:     Age: 22 years     Sex: Female     Is Non-Hispanic African American: Yes     Diabetic: Yes     Tobacco smoker: No     Systolic Blood Pressure: 166 mmHg     Is BP treated: Yes     HDL Cholesterol: 88 mg/dL     Total Cholesterol: 179 mg/dL  6. Obesity, current BMI 34.9  Course: Leslie Davis is currently in the action stage of change. As such, her goal is to continue with weight loss efforts.   Nutrition goals: She has agreed to the Category 1 Plan with breakfast options.   Exercise goals: As is.  Behavioral modification strategies: increasing lean protein intake,  decreasing simple carbohydrates, meal planning and cooking strategies, keeping healthy foods in the home and planning for success.  Ameyah has agreed to follow-up with our clinic in 2 weeks. She was informed of the importance of frequent follow-up visits to maximize her success with intensive lifestyle modifications for her multiple health conditions.   Objective:   Blood pressure 140/80, pulse (!) 50, temperature (!) 97.5 F (36.4 C), height 5\' 4"  (1.626 m), weight 203 lb (92.1 kg), SpO2 99 %. Body mass index is 34.84 kg/m.  General: Cooperative, alert, well developed, in no acute distress. HEENT: Conjunctivae and lids unremarkable. Cardiovascular: Regular rhythm.  Lungs: Normal work of breathing. Neurologic: No focal deficits.   Lab Results  Component Value Date   CREATININE 1.00 07/23/2020   BUN 18 07/23/2020   NA 140 07/23/2020   K 4.4 07/23/2020   CL 102 07/23/2020   CO2 24 07/23/2020   Lab Results  Component Value Date   ALT 45 (H) 07/23/2020   AST 37 07/23/2020   ALKPHOS 149 (H) 07/23/2020   BILITOT 0.2 07/23/2020   Lab Results  Component Value Date   HGBA1C 8.7 (H) 05/28/2020   HGBA1C 8.8 (H) 02/21/2020   HGBA1C 10.5 (H) 11/15/2019   HGBA1C 9.6 (H) 08/13/2019   HGBA1C 8.4 (H) 05/16/2019   Lab Results  Component Value Date   TSH 1.790 07/23/2020   Lab Results  Component Value Date   CHOL 179 07/23/2020   HDL 88 07/23/2020   LDLCALC 74 07/23/2020   TRIG 99 07/23/2020   CHOLHDL 2.0 07/23/2020   Lab Results  Component Value Date   WBC 4.6 07/23/2020   HGB 12.7 07/23/2020   HCT 38.6 07/23/2020   MCV 85 07/23/2020   PLT 290 07/23/2020   Lab Results  Component Value Date   IRON 68 11/16/2017   TIBC 355 11/16/2017   FERRITIN 45.2 11/16/2017   Attestation Statements:   Reviewed by clinician on day of visit: allergies, medications, problem list, medical history, surgical history, family history, social history, and previous encounter notes.  Time  spent on visit including pre-visit chart review and post-visit care and charting was 45 minutes.   I, Water quality scientist, CMA, am acting as Location manager for [employer], DO.  I have reviewed the above documentation for accuracy and completeness, and I agree with the above. Marjory Sneddon, D.O.  [place]  was signed into law in 2016 which includes the topic of electronic health records.  This provides immediate access to information in MyChart.  This includes consultation notes, operative notes, office notes, lab results and pathology reports.  If you have any questions about what you read please let us know at your next visit so we can discuss your concerns and take corrective action if need be.  We are right here with you.

## 2020-08-14 ENCOUNTER — Telehealth: Payer: Self-pay

## 2020-08-14 ENCOUNTER — Telehealth: Payer: Medicare Other

## 2020-08-14 NOTE — Telephone Encounter (Signed)
  Care Management   Follow Up Note   08/14/2020 Name: Leslie Davis MRN: [id number] DOB: 1956-02-18   Referred by: [name], MD Reason for referral : Chronic Care Management (RNCM Follow up call - 2nd attempt )   A second unsuccessful telephone outreach was attempted today. The patient was referred to the case management team for assistance with care management and care coordination.   Follow Up Plan: The care management team will reach out to the patient again over the next 30 days.   Barb Merino, RN, BSN, CCM Care Management Coordinator [place] [facility]  Direct Phone: [phone]

## 2020-08-20 ENCOUNTER — Telehealth: Payer: Self-pay

## 2020-08-20 NOTE — Chronic Care Management (AMB) (Deleted)
Chronic Care Management Pharmacy Assistant   Name: Leslie Davis  MRN: [id number] DOB: 09-26-1955   Reason for Encounter: Adherence call/ Diabetes     Recent office visits:  07-10-2020 Bary Castilla, NP. START Azithromycin 250 MG Take 2 tablets (500 mg) on Day 1, followed by 1 tablet (250 mg) once daily on Days 2 through 5.   Recent consult visits:  07-18-2020 Minus Breeding, MD (Cardiology). STOP Tramadol 50 MG 1 tablet every 6 hours as needed.  07-23-2020 [name], DO (Weight management)  08-06-2020 [name], DO (Weight management). CHANGE Semaglutide 0.25 MG weekly to 1 MG weekly. CHANGE vitamin D3 50,000 units 1 capsule on Tuesday and Friday to 1 capsule every 2 weeks. CHANGE Insulin Glargine 300 unit/ML inject 50 units at bedtime to 45 units at bedtime.  Hospital visits:  None in previous 6 months  Medications: Outpatient Encounter Medications as of 08/20/2020  Medication Sig Note  . acetaminophen-codeine (TYLENOL #3) 300-30 MG tablet Take 1 tablet by mouth every 6 (six) hours as needed for moderate pain.   Marland Kitchen amLODipine (NORVASC) 10 MG tablet Take 1 tablet (10 mg total) by mouth daily.   Marland Kitchen aspirin EC 81 MG tablet Take 1 tablet (81 mg total) by mouth daily.   . Azilsartan Medoxomil (EDARBI) 80 MG TABS TAKE 1 TABLET EACH DAY.   . benzonatate (TESSALON PERLES) 100 MG capsule Take 1 capsule (100 mg total) by mouth every 6 (six) hours as needed for cough. 07/23/2020: prn  . Blood Glucose Monitoring Suppl (ONETOUCH VERIO) w/Device KIT Use as directed to check blood sugars 2 times per day dx: e11.65   . cetirizine (ZYRTEC) 10 MG tablet Take 1 tablet (10 mg total) by mouth daily.   . Cholecalciferol (VITAMIN D3) 1.25 MG (50000 UT) CAPS Take 1 capsule by mouth every 2 wks   . ferrous gluconate (FERGON) 324 MG tablet TAKE 1 TABLET BY MOUTH TWICE DAILY WITH A MEAL.   Marland Kitchen glucose blood (ONETOUCH VERIO) test strip Use as instructed to check blood sugars 2 times per  day dx:e11.65   . insulin glargine, 1 Unit Dial, (TOUJEO SOLOSTAR) 300 UNIT/ML Solostar Pen Inject 45 units subcutaneous at bedtime   . insulin lispro (HUMALOG KWIKPEN) 100 UNIT/ML KwikPen Sliding scale   . Insulin Pen Needle 32G X 6 MM MISC Use with pen injectors daily dx code: e11.9   . Magnesium 400 MG TABS One tab po qpm   . OneTouch Delica Lancets 40J MISC Use as directed to check blood sugars 2 times per day dx:e11.65   . pantoprazole (PROTONIX) 40 MG tablet Take one tablet by mouth 3 days a week   . rosuvastatin (CRESTOR) 20 MG tablet Take 1 tablet (20 mg total) by mouth daily.   . Semaglutide, 1 MG/DOSE, (OZEMPIC, 1 MG/DOSE,) 4 MG/3ML SOPN Inject 1 mg into the skin once a week.   . temazepam (RESTORIL) 30 MG capsule One capsules po qhs prn   . traMADol (ULTRAM) 50 MG tablet Take 50 mg by mouth every 6 (six) hours as needed.    No facility-administered encounter medications on file as of 08/20/2020.   Recent Relevant Labs: Lab Results  Component Value Date/Time   HGBA1C 8.7 (H) 05/28/2020 09:57 AM   HGBA1C 8.8 (H) 02/21/2020 03:00 PM   MICROALBUR 150 11/15/2019 10:20 AM    Kidney Function Lab Results  Component Value Date/Time   CREATININE 1.00 07/23/2020 10:42 AM   CREATININE 1.15 (H) 02/21/2020 03:00 PM  CREATININE 0.74 08/30/2011 01:07 PM   GFR 77.09 11/17/2017 09:00 AM   GFRNONAA 50 (L) 02/21/2020 03:00 PM   GFRAA 57 (L) 02/21/2020 03:00 PM    . Current antihyperglycemic regimen:   Ozempic 1 mg once per week - Wednesday   Toujeo - 45 units at bedtime   Humalog Sliding Scale  . What recent interventions/DTPs have been made to improve glycemic control:  o *** . Have there been any recent hospitalizations or ED visits since last visit with CPP? No   . Patient {reports/denies:24182} hypoglycemic symptoms, including {Hypoglycemic Symptoms:3049003} . Patient {reports/denies:24182} hyperglycemic symptoms, including {symptoms; hyperglycemia:17903} . How often are you  checking your blood sugar? {BG Testing frequency:23922} . What are your blood sugars ranging?  o Fasting: *** o Before meals: *** o After meals: *** o Bedtime: *** . During the week, how often does your blood glucose drop below 70? {LowBGfrequency:24142} . Are you checking your feet daily/regularly?   Adherence Review: Is the patient currently on a STATIN medication? Yes Is the patient currently on ACE/ARB medication? Yes Does the patient have >5 day gap between last estimated fill dates? No  08-20-2020: 1st attempt Left VM  Star Rating Drugs: Rosuvastatin 20 MG- Last filled 06-10-2020 [facility] Ozempic 1 MG- Last filled 08-06-2020 [facility] Azilsartan 80 MG- Last filled 06-10-2020 [facility]  [place] Clinical Pharmacist Assistant [phone]

## 2020-08-25 ENCOUNTER — Encounter: Payer: Self-pay | Admitting: Internal Medicine

## 2020-08-25 ENCOUNTER — Other Ambulatory Visit: Payer: Self-pay

## 2020-08-25 ENCOUNTER — Ambulatory Visit (INDEPENDENT_AMBULATORY_CARE_PROVIDER_SITE_OTHER): Payer: Medicare Other | Admitting: Internal Medicine

## 2020-08-25 VITALS — BP 124/76 | HR 62 | Temp 97.8°F | Ht 64.0 in | Wt 204.4 lb

## 2020-08-25 DIAGNOSIS — N182 Chronic kidney disease, stage 2 (mild): Secondary | ICD-10-CM | POA: Diagnosis not present

## 2020-08-25 DIAGNOSIS — E1122 Type 2 diabetes mellitus with diabetic chronic kidney disease: Secondary | ICD-10-CM

## 2020-08-25 DIAGNOSIS — Z23 Encounter for immunization: Secondary | ICD-10-CM

## 2020-08-25 DIAGNOSIS — Z794 Long term (current) use of insulin: Secondary | ICD-10-CM | POA: Diagnosis not present

## 2020-08-25 DIAGNOSIS — Z6835 Body mass index (BMI) 35.0-35.9, adult: Secondary | ICD-10-CM

## 2020-08-25 DIAGNOSIS — I129 Hypertensive chronic kidney disease with stage 1 through stage 4 chronic kidney disease, or unspecified chronic kidney disease: Secondary | ICD-10-CM | POA: Diagnosis not present

## 2020-08-25 NOTE — Progress Notes (Signed)
I,[name],acting as a Education administrator for [name], MD.,have documented all relevant documentation on the behalf of [name], MD,as directed by  [name], MD while in the presence of [name], MD.  This visit occurred during the SARS-CoV-2 public health emergency.  Safety protocols were in place, including screening questions prior to the visit, additional usage of staff PPE, and extensive cleaning of exam room while observing appropriate contact time as indicated for disinfecting solutions.  Subjective:     Patient ID: Leslie Davis , female    DOB: 11/06/1955 , 65 y.o.   MRN: [id number]   Chief Complaint  Patient presents with   Diabetes   Hypertension    HPI  Patient here for a f/u on her diabetes/htn. She is hoping her sugars have improved. She is hoping that her a1c has improved such that she can move forward with left knee replacement. States her a1c needs to be below 7.0. She states she has stopped drinking sodas, sweet tea and juice, but does drink diet sodas. She drinks Coke Zero. She has also switched to 45 calorie bread.   Diabetes She presents for her follow-up diabetic visit. She has type 2 diabetes mellitus. Her disease course has been stable. There are no hypoglycemic associated symptoms. Pertinent negatives for diabetes include no blurred vision, no polydipsia, no polyphagia and no polyuria. There are no hypoglycemic complications. Diabetic complications include nephropathy. Risk factors for coronary artery disease include diabetes mellitus, dyslipidemia, hypertension and post-menopausal. She participates in exercise intermittently. An ACE inhibitor/angiotensin II receptor blocker is being taken. Eye exam is current.  Hypertension This is a chronic problem. The current episode started more than 1 year ago. The problem has been gradually improving since onset. Pertinent negatives include no blurred vision. Risk factors for coronary artery disease  include dyslipidemia and diabetes mellitus. The current treatment provides moderate improvement.    Past Medical History:  Diagnosis Date   DM (diabetes mellitus) ([place])    Fatty liver disease, nonalcoholic    Gastritis    Gastroparesis    GERD (gastroesophageal reflux disease)    Heartburn    Hiatal hernia    HLD (hyperlipidemia)    HTN (hypertension)    Joint pain    Renal calculus    Rheumatoid arthritis ([place])    Vitamin D deficiency      Family History  Problem Relation Age of Onset   Diabetes Mother    Thyroid disease Mother    Kidney disease Mother    Diabetes Maternal Grandmother    Stomach cancer Maternal Aunt        X 2 aunts   Hypertension Father    Hyperlipidemia Father    Sudden death Father    Stroke Father    Colon cancer Neg Hx    Esophageal cancer Neg Hx    Rectal cancer Neg Hx      Current Outpatient Medications:    acetaminophen-codeine (TYLENOL #3) 300-30 MG tablet, Take 1 tablet by mouth every 6 (six) hours as needed for moderate pain., Disp: 30 tablet, Rfl: 0   Blood Glucose Monitoring Suppl (ONETOUCH VERIO) w/Device KIT, Use as directed to check blood sugars 2 times per day dx: e11.65, Disp: 1 kit, Rfl: 1   cetirizine (ZYRTEC) 10 MG tablet, Take 1 tablet (10 mg total) by mouth daily., Disp: 90 tablet, Rfl: 2   Cholecalciferol (VITAMIN D3) 1.25 MG (50000 UT) CAPS, Take 1 capsule by mouth every 2 wks,  Disp: 2 capsule, Rfl: 0   ferrous gluconate (FERGON) 324 MG tablet, TAKE 1 TABLET BY MOUTH TWICE DAILY WITH A MEAL., Disp: 100 tablet, Rfl: 0   glucose blood (ONETOUCH VERIO) test strip, Use as instructed to check blood sugars 2 times per day dx:e11.65, Disp: 150 each, Rfl: 3   insulin lispro (HUMALOG KWIKPEN) 100 UNIT/ML KwikPen, Sliding scale, Disp: 30 mL, Rfl: 2   Insulin Pen Needle 32G X 6 MM MISC, Use with pen injectors daily dx code: e11.9, Disp: 150 each, Rfl: 3   Magnesium 400 MG TABS, One tab po qpm, Disp: 90 tablet, Rfl: 1   OneTouch Delica  Lancets 33G MISC, Use as directed to check blood sugars 2 times per day dx:e11.65, Disp: 150 each, Rfl: 3   pneumococcal 20-Val Conj Vacc (PREVNAR 20) 0.5 ML SUSY, Inject 0.5 mLs into the muscle tomorrow at 10 am for 1 dose., Disp: 0.5 mL, Rfl: 0   rosuvastatin (CRESTOR) 20 MG tablet, Take 1 tablet (20 mg total) by mouth daily., Disp: 90 tablet, Rfl: 1   traMADol (ULTRAM) 50 MG tablet, Take 50 mg by mouth every 6 (six) hours as needed., Disp: , Rfl:    amLODipine (NORVASC) 10 MG tablet, Take 1 tablet (10 mg total) by mouth daily., Disp: 90 tablet, Rfl: 0   aspirin EC 81 MG tablet, Take 1 tablet (81 mg total) by mouth daily., Disp: 90 tablet, Rfl: 2   Azilsartan Medoxomil (EDARBI) 80 MG TABS, TAKE 1 TABLET EACH DAY., Disp: 90 tablet, Rfl: 2   benzonatate (TESSALON PERLES) 100 MG capsule, Take 1 capsule (100 mg total) by mouth every 6 (six) hours as needed for cough. (Patient not taking: Reported on 08/25/2020), Disp: 30 capsule, Rfl: 1   insulin glargine, 1 Unit Dial, (TOUJEO SOLOSTAR) 300 UNIT/ML Solostar Pen, Inject 40 units subcutaneous at bedtime, Disp: 22.5 mL, Rfl: 1   pantoprazole (PROTONIX) 40 MG tablet, Take one tablet by mouth 3 days a week, Disp: 90 tablet, Rfl: 2   Semaglutide, 1 MG/DOSE, (OZEMPIC, 1 MG/DOSE,) 4 MG/3ML SOPN, Inject 1 mg into the skin once a week., Disp: 9 mL, Rfl: 3   temazepam (RESTORIL) 30 MG capsule, One capsules po qhs prn, Disp: 60 capsule, Rfl: 0   Allergies  Allergen Reactions   Fiasp [Insulin Aspart (W-Niacinamide)] Hives     Review of Systems  Constitutional: Negative.   Eyes:  Negative for blurred vision.  Respiratory: Negative.    Cardiovascular: Negative.   Gastrointestinal: Negative.   Endocrine: Negative for polydipsia, polyphagia and polyuria.  Neurological: Negative.   Psychiatric/Behavioral: Negative.      Today's Vitals   08/25/20 1139  BP: 124/76  Pulse: 62  Temp: 97.8 F (36.6 C)  TempSrc: Oral  Weight: 204 lb 6.4 oz (92.7 kg)  Height:  5\' 4"  (1.626 m)  PainSc: 7   PainLoc: Knee   Body mass index is 35.09 kg/m.  Wt Readings from Last 3 Encounters:  09/03/20 206 lb 6.4 oz (93.6 kg)  08/27/20 201 lb (91.2 kg)  08/25/20 204 lb 6.4 oz (92.7 kg)   BP Readings from Last 3 Encounters:  08/27/20 (!) 149/77  08/25/20 124/76  08/06/20 140/80   Objective:  Physical Exam Vitals and nursing note reviewed.  Constitutional:      Appearance: Normal appearance. She is obese.  HENT:     Head: Normocephalic and atraumatic.     Nose:     Comments: Masked     Mouth/Throat:  Comments: Masked  Cardiovascular:     Rate and Rhythm: Normal rate and regular rhythm.     Heart sounds: Normal heart sounds.  Pulmonary:     Effort: Pulmonary effort is normal.     Breath sounds: Normal breath sounds.  Skin:    General: Skin is warm.  Neurological:     General: No focal deficit present.     Mental Status: She is alert.  Psychiatric:        Mood and Affect: Mood normal.        Behavior: Behavior normal.        Assessment And Plan:     1. Type 2 diabetes mellitus with stage 2 chronic kidney disease, with long-term current use of insulin (HCC) Comments: Chronic, I will check labs as listed below. She is encouraged to cut back on intake of diet sodas. Pt advised her a1c needs to be <7.8%.  - Hemoglobin A1c  2. Hypertensive nephropathy Comments: Chronic, well controlled. CMP results from May 4th reviewed in detail.   3. Class 2 severe obesity due to excess calories with serious comorbidity and body mass index (BMI) of 35.0 to 35.9 in adult [facility]) Comments:  She is encouraged to strive for BMI less than 30 to decrease cardiac risk. Advised to aim for at least 150 minutes of exercise per week.  4. Immunization due Comments: I wil send rx [id number] to her local pharmacy.    Patient was given opportunity to ask questions. Patient verbalized understanding of the plan and was able to repeat key elements of the plan. All questions  were answered to their satisfaction.   I, [name], MD, have reviewed all documentation for this visit. The documentation on 08/25/20 for the exam, diagnosis, procedures, and orders are all accurate and complete.   IF YOU HAVE BEEN REFERRED TO A SPECIALIST, IT MAY TAKE 1-2 WEEKS TO SCHEDULE/PROCESS THE REFERRAL. IF YOU HAVE NOT HEARD FROM US/SPECIALIST IN TWO WEEKS, PLEASE GIVE [place] A CALL AT [phone].   THE PATIENT IS ENCOURAGED TO PRACTICE SOCIAL DISTANCING DUE TO THE COVID-19 PANDEMIC.

## 2020-08-25 NOTE — Patient Instructions (Signed)
Diabetes Mellitus and Foot Care Foot care is an important part of your health, especially when you have diabetes. Diabetes may cause you to have problems because of poor blood flow (circulation) to your feet and legs, which can cause your skin to:  Become thinner and drier.  Break more easily.  Heal more slowly.  Peel and crack. You may also have nerve damage (neuropathy) in your legs and feet, causing decreased feeling in them. This means that you may not notice minor injuries to your feet that could lead to more serious problems. Noticing and addressing any potential problems early is the best way to prevent future foot problems. How to care for your feet Foot hygiene  Wash your feet daily with warm water and mild soap. Do not use hot water. Then, pat your feet and the areas between your toes until they are completely dry. Do not soak your feet as this can dry your skin.  Trim your toenails straight across. Do not dig under them or around the cuticle. File the edges of your nails with an emery board or nail file.  Apply a moisturizing lotion or petroleum jelly to the skin on your feet and to dry, brittle toenails. Use lotion that does not contain alcohol and is unscented. Do not apply lotion between your toes.   Shoes and socks  Wear clean socks or stockings every day. Make sure they are not too tight. Do not wear knee-high stockings since they may decrease blood flow to your legs.  Wear shoes that fit properly and have enough cushioning. Always look in your shoes before you put them on to be sure there are no objects inside.  To break in new shoes, wear them for just a few hours a day. This prevents injuries on your feet. Wounds, scrapes, corns, and calluses  Check your feet daily for blisters, cuts, bruises, sores, and redness. If you cannot see the bottom of your feet, use a mirror or ask someone for help.  Do not cut corns or calluses or try to remove them with medicine.  If you  find a minor scrape, cut, or break in the skin on your feet, keep it and the skin around it clean and dry. You may clean these areas with mild soap and water. Do not clean the area with peroxide, alcohol, or iodine.  If you have a wound, scrape, corn, or callus on your foot, look at it several times a day to make sure it is healing and not infected. Check for: ? Redness, swelling, or pain. ? Fluid or blood. ? Warmth. ? Pus or a bad smell.   General tips  Do not cross your legs. This may decrease blood flow to your feet.  Do not use heating pads or hot water bottles on your feet. They may burn your skin. If you have lost feeling in your feet or legs, you may not know this is happening until it is too late.  Protect your feet from hot and cold by wearing shoes, such as at the beach or on hot pavement.  Schedule a complete foot exam at least once a year (annually) or more often if you have foot problems. Report any cuts, sores, or bruises to your health care provider immediately. Where to find more information  American Diabetes Association: www.diabetes.org  Association of Diabetes Care & Education Specialists: www.diabeteseducator.org Contact a health care provider if:  You have a medical condition that increases your risk of infection and   you have any cuts, sores, or bruises on your feet.  You have an injury that is not healing.  You have redness on your legs or feet.  You feel burning or tingling in your legs or feet.  You have pain or cramps in your legs and feet.  Your legs or feet are numb.  Your feet always feel cold.  You have pain around any toenails. Get help right away if:  You have a wound, scrape, corn, or callus on your foot and: ? You have pain, swelling, or redness that gets worse. ? You have fluid or blood coming from the wound, scrape, corn, or callus. ? Your wound, scrape, corn, or callus feels warm to the touch. ? You have pus or a bad smell coming from  the wound, scrape, corn, or callus. ? You have a fever. ? You have a red line going up your leg. Summary  Check your feet every day for blisters, cuts, bruises, sores, and redness.  Apply a moisturizing lotion or petroleum jelly to the skin on your feet and to dry, brittle toenails.  Wear shoes that fit properly and have enough cushioning.  If you have foot problems, report any cuts, sores, or bruises to your health care provider immediately.  Schedule a complete foot exam at least once a year (annually) or more often if you have foot problems. This information is not intended to replace advice given to you by your health care provider. Make sure you discuss any questions you have with your health care provider. Document Revised: 09/27/2019 Document Reviewed: 09/27/2019 Elsevier Patient Education  2021 Elsevier Inc.  

## 2020-08-26 LAB — HEMOGLOBIN A1C
Est. average glucose Bld gHb Est-mCnc: 148 mg/dL
Hgb A1c MFr Bld: 6.8 % — ABNORMAL HIGH (ref 4.8–5.6)

## 2020-08-27 ENCOUNTER — Ambulatory Visit (INDEPENDENT_AMBULATORY_CARE_PROVIDER_SITE_OTHER): Payer: Medicare Other | Admitting: Adult Health

## 2020-08-27 ENCOUNTER — Other Ambulatory Visit: Payer: Self-pay

## 2020-08-27 VITALS — BP 149/77 | HR 56 | Temp 98.2°F | Ht 64.0 in | Wt 201.0 lb

## 2020-08-27 DIAGNOSIS — I152 Hypertension secondary to endocrine disorders: Secondary | ICD-10-CM

## 2020-08-27 DIAGNOSIS — E1159 Type 2 diabetes mellitus with other circulatory complications: Secondary | ICD-10-CM | POA: Diagnosis not present

## 2020-08-27 DIAGNOSIS — Z6835 Body mass index (BMI) 35.0-35.9, adult: Secondary | ICD-10-CM | POA: Diagnosis not present

## 2020-08-27 DIAGNOSIS — Z794 Long term (current) use of insulin: Secondary | ICD-10-CM

## 2020-08-27 DIAGNOSIS — E1169 Type 2 diabetes mellitus with other specified complication: Secondary | ICD-10-CM

## 2020-08-27 MED ORDER — TOUJEO SOLOSTAR 300 UNIT/ML ~~LOC~~ SOPN: PEN_INJECTOR | SUBCUTANEOUS | 1 refills | Status: DC

## 2020-08-28 ENCOUNTER — Other Ambulatory Visit: Payer: Self-pay

## 2020-08-28 ENCOUNTER — Telehealth: Payer: Self-pay

## 2020-08-28 MED ORDER — PANTOPRAZOLE SODIUM 40 MG PO TBEC: DELAYED_RELEASE_TABLET | ORAL | 2 refills | Status: DC

## 2020-08-28 MED ORDER — ASPIRIN EC 81 MG PO TBEC: 81.0000 mg | DELAYED_RELEASE_TABLET | Freq: Every day | ORAL | 2 refills | Status: DC

## 2020-08-28 MED ORDER — EDARBI 80 MG PO TABS: ORAL_TABLET | ORAL | 2 refills | Status: DC

## 2020-08-28 NOTE — Chronic Care Management (AMB) (Signed)
Chronic Care Management Pharmacy Assistant   Name: Leslie Davis  MRN: [id number] DOB: 29-Aug-1955   Reason for Encounter: Medication Review/ Medication Coordination, Diabetes assessment.    Recent office visits:  07-10-2020 Bary Castilla, NP. START Azithromycin 250 MG Take 2 tablets (500 mg) on Day 1, followed by 1 tablet (250 mg) once daily on Days 2 through 5  08-25-2020 [name], MD  Recent consult visits:  07-18-2020 Minus Breeding, MD (Cardiology). STOP Tramadol 50 MG 1 tablet every 6 hours as needed.   07-23-2020 [name], DO (Weight management)   08-06-2020 [name], DO (Weight management). CHANGE Semaglutide 0.25 MG weekly to 1 MG weekly. CHANGE vitamin D3 50,000 units 1 capsule on Tuesday and Friday to 1 capsule every 2 weeks. CHANGE Insulin Glargine 300 unit/ML inject 50 units at bedtime to 45 units at bedtime.  08-27-2020 Danford, Valetta Fuller D, NP. CHANGE Toujeo 45 units at bedtime TO 40 units at bedtime.   Hospital visits:  None in previous 6 months  Medications: Outpatient Encounter Medications as of 08/28/2020  Medication Sig Note   acetaminophen-codeine (TYLENOL #3) 300-30 MG tablet Take 1 tablet by mouth every 6 (six) hours as needed for moderate pain.    amLODipine (NORVASC) 10 MG tablet Take 1 tablet (10 mg total) by mouth daily.    aspirin EC 81 MG tablet Take 1 tablet (81 mg total) by mouth daily.    Azilsartan Medoxomil (EDARBI) 80 MG TABS TAKE 1 TABLET EACH DAY.    benzonatate (TESSALON PERLES) 100 MG capsule Take 1 capsule (100 mg total) by mouth every 6 (six) hours as needed for cough. (Patient not taking: Reported on 08/25/2020) 07/23/2020: prn   Blood Glucose Monitoring Suppl (ONETOUCH VERIO) w/Device KIT Use as directed to check blood sugars 2 times per day dx: e11.65    cetirizine (ZYRTEC) 10 MG tablet Take 1 tablet (10 mg total) by mouth daily.    Cholecalciferol (VITAMIN D3) 1.25 MG (50000 UT) CAPS Take 1 capsule by mouth every  2 wks    ferrous gluconate (FERGON) 324 MG tablet TAKE 1 TABLET BY MOUTH TWICE DAILY WITH A MEAL.    glucose blood (ONETOUCH VERIO) test strip Use as instructed to check blood sugars 2 times per day dx:e11.65    insulin glargine, 1 Unit Dial, (TOUJEO SOLOSTAR) 300 UNIT/ML Solostar Pen Inject 40 units subcutaneous at bedtime    insulin lispro (HUMALOG KWIKPEN) 100 UNIT/ML KwikPen Sliding scale    Insulin Pen Needle 32G X 6 MM MISC Use with pen injectors daily dx code: e11.9    Magnesium 400 MG TABS One tab po qpm    OneTouch Delica Lancets 74J MISC Use as directed to check blood sugars 2 times per day dx:e11.65    pantoprazole (PROTONIX) 40 MG tablet Take one tablet by mouth 3 days a week    rosuvastatin (CRESTOR) 20 MG tablet Take 1 tablet (20 mg total) by mouth daily.    Semaglutide, 1 MG/DOSE, (OZEMPIC, 1 MG/DOSE,) 4 MG/3ML SOPN Inject 1 mg into the skin once a week.    temazepam (RESTORIL) 30 MG capsule One capsules po qhs prn    traMADol (ULTRAM) 50 MG tablet Take 50 mg by mouth every 6 (six) hours as needed.    No facility-administered encounter medications on file as of 08/28/2020.   Reviewed chart for medication changes ahead of medication coordination call.  No medication changes indicated OR if recent visit, treatment plan here.  BP Readings from Last  3 Encounters:  08/27/20 (!) 149/77  08/25/20 124/76  08/06/20 140/80    Lab Results  Component Value Date   HGBA1C 6.8 (H) 08/25/2020     Patient obtains medications through Vials  90 Days   Last adherence delivery included:  NONE  Patient declined medications last month: Amlodipine 10 mg -1 tablet daily Rosuvastatin 20 mg- 1 tablet daily ASA 81 mg- 1 tablet daily Edarbi 80 mg- 1 tablet daily  Ozempic 2/1.5 ml- Inject 0.25 mg sq once a week Onetouch Verio Test Strips- Use to check blood sugars twice daily Trueplus Pen 31g 46mm- Use with pen to inject insulin daily.                                                 Due to  receiving a 90 day supply on 06/10/2020   Toujeo -50 units sq nightly due to receiving an 81-day supply on 05/05/2020   Temazepam 30 mg- 1 capsule at bedtime as needed (64-day supply) Vitamin D 50000 units -1 capsule Tuesday and Friday (84-day supply)                         due to receiving a supply on 05/07/2020   Cetirizine 10 mg- 1 tablet daily Pantoprazole 40 mg- 1 tablet three times a week.             Due to receiving a 90 day supply on 05/27/2020   Hydrochlorothiazide 12.5 mg- 1 tablet daily due to being discontinued   Tramadol 50 mg- 1 tablet by mouth three times daily as needed DUE PRN use Clindamycin lotion 1%- apply to skin in the morning daily due to PRN use received a supply on 01/09/2020. Ferrous Gluconate 324 mg- 1 tablet twice daily due to receiving a 90-day supply on 01/09/2020- Patient has OTC left.    Patient is due for next adherence delivery on: 09-04-2020  Called patient and reviewed medications and coordinated delivery.  This delivery to include: Ozempic 2/1.5 ml- Inject 0.25 mg sq once a week Toujeo -50 units sq nightly Trueplus Pen 31g 30mm- Use with pen to inject insulin daily. Rosuvastatin 20 mg- 1 tablet daily ASA 81 mg- 1 tablet daily Edarbi 80 mg- 1 tablet daily  Amlodipine 10 mg -1 tablet daily Protonix 40 mg- 1 tablet 3 times weekly  No acute or short fill needed  Patient declined the following medications: Onetouch Veriotest strips- Patient has 2 bottles left  Patient needs refills for: Aspirin 81 MG, Edarbi 80 MG, Pantoprazole 40 MG, Ozempic 0.25 mg. Refill request sent to PCP.  Confirmed delivery date of 09-04-2020, advised patient that pharmacy will contact them the morning of delivery.  NOTE: Patient reports recent blood sugar reading of 84 and blood pressure reading of 124/76     Current antihyperglycemic regimen:  Ozempic 1 mg once per week - Wednesday Toujeo - 45 units at bedtime Humalog Sliding Scale   What recent  interventions/DTPs have been made to improve glycemic control: Patient states she is taking medications as directed  Have there been any recent hospitalizations or ED visits since last visit with CPP? No    Patient denies hypoglycemic symptoms  Patient denies hyperglycemic symptoms  How often are you checking your blood sugar? once daily  What are your blood sugars ranging? Fasting: None Before  meals: 84 After meals: None Bedtime: None  During the week, how often does your blood glucose drop below 70? Never  Are you checking your feet daily/regularly? Patient states daily   Adherence Review: Is the patient currently on a STATIN medication? Yes Is the patient currently on ACE/ARB medication? Yes Does the patient have >5 day gap between last estimated fill dates? No  Star Rating Drugs: Rosuvastatin 20 MG- Last filled 06-10-2020 [facility] Ozempic 1 MG- Last filled 08-06-2020 [facility] Azilsartan 80 MG- Last filled 06-10-2020 [facility]    [facility] Clinical Pharmacist Assistant [phone]

## 2020-08-29 ENCOUNTER — Other Ambulatory Visit: Payer: Self-pay

## 2020-08-29 MED ORDER — AMLODIPINE BESYLATE 10 MG PO TABS: 10.0000 mg | ORAL_TABLET | Freq: Every day | ORAL | 0 refills | Status: DC

## 2020-09-01 ENCOUNTER — Other Ambulatory Visit: Payer: Self-pay

## 2020-09-01 DIAGNOSIS — Z794 Long term (current) use of insulin: Secondary | ICD-10-CM

## 2020-09-01 DIAGNOSIS — E1169 Type 2 diabetes mellitus with other specified complication: Secondary | ICD-10-CM

## 2020-09-01 MED ORDER — OZEMPIC (1 MG/DOSE) 4 MG/3ML ~~LOC~~ SOPN: 1.0000 mg | PEN_INJECTOR | SUBCUTANEOUS | 3 refills | Status: DC

## 2020-09-01 NOTE — Progress Notes (Signed)
error 

## 2020-09-02 NOTE — Progress Notes (Signed)
Chief Complaint:   OBESITY Leslie Davis is here to discuss her progress with her obesity treatment plan along with follow-up of her obesity related diagnoses. Leslie Davis is on the Category 1 Plan and states she is following her eating plan approximately 90% of the time. Leslie Davis states she is walking and biking 30 minutes 1 times per week.  Today's visit was #: 3 Starting weight: 208 lbs Starting date: 07/23/2020 Today's weight: 201 lbs Today's date: 08/27/2020 Total lbs lost to date: 7 Total lbs lost since last in-office visit: 2  Interim History: Toujeo was decreased from 50 units QHS to 45 units QHS at last OV on 08/06/2020 and Ozempic was increased from 0.5 mg to 1 mg once a week. Fasting BG runs 60-110's. Leslie Davis was seen by her PCP 08/25/2020. A1c is 6.8- at goal.  Subjective:   1. Type 2 diabetes mellitus with other specified complication, with long-term current use of insulin ([place]) 08/25/2020 A1c 6.8- at goal. Leslie Davis's fasting BG 72, 116, 118, 99, 105, 96, 94, 81, 109, 83, 129, 68- reports feeling sx's of hypoglycemia with the low reading.  Lab Results  Component Value Date   HGBA1C 6.8 (H) 08/25/2020   HGBA1C 8.7 (H) 05/28/2020   HGBA1C 8.8 (H) 02/21/2020   Lab Results  Component Value Date   MICROALBUR 150 11/15/2019   LDLCALC 74 07/23/2020   CREATININE 1.00 07/23/2020   No results found for: INSULIN  2. Hypertension associated with type 2 diabetes mellitus (HCC) BP/HR stable at OV. Leslie Davis denies CP/dyspnea/palpitations. She is on amlodipine 10 mg QD.  BP Readings from Last 3 Encounters:  08/27/20 (!) 149/77  08/25/20 124/76  08/06/20 140/80   Assessment/Plan:   1. Type 2 diabetes mellitus with other specified complication, with long-term current use of insulin (HCC) Good blood sugar control is important to decrease the likelihood of diabetic complications such as nephropathy, neuropathy, limb loss, blindness, coronary artery disease, and death. Intensive lifestyle  modification including diet, exercise and weight loss are the first line of treatment for diabetes.  -Continue to closely monitor ambulatory BG and bring log to follow up OV. -Decrease Toujeo from 45 units to 40 units QHS.  - insulin glargine, 1 Unit Dial, (TOUJEO SOLOSTAR) 300 UNIT/ML Solostar Pen; Inject 40 units subcutaneous at bedtime  Dispense: 22.5 mL; Refill: 1  2. Hypertension associated with type 2 diabetes mellitus ([place]) Leslie Davis is working on healthy weight loss and exercise to improve blood pressure control. We will watch for signs of hypotension as she continues her lifestyle modifications. -Continue Category 1 meal plan and CCB.  3. Obesity, current BMI 34.6  Leslie Davis is currently in the action stage of change. As such, her goal is to continue with weight loss efforts. She has agreed to the Category 1 Plan.   Handout: Additional Breakfast Options  Exercise goals:  As is  Behavioral modification strategies: increasing lean protein intake, decreasing simple carbohydrates, meal planning and cooking strategies, keeping healthy foods in the home, and planning for success.  Leslie Davis has agreed to follow-up with our clinic in 2 weeks. She was informed of the importance of frequent follow-up visits to maximize her success with intensive lifestyle modifications for her multiple health conditions.   Objective:   Blood pressure (!) 149/77, pulse (!) 56, temperature 98.2 F (36.8 C), height 5\' 4"  (1.626 m), weight 201 lb (91.2 kg), SpO2 98 %. Body mass index is 34.5 kg/m.  General: Cooperative, alert, well developed, in no acute distress. HEENT: Conjunctivae  and lids unremarkable. Cardiovascular: Regular rhythm.  Lungs: Normal work of breathing. Neurologic: No focal deficits.   Lab Results  Component Value Date   CREATININE 1.00 07/23/2020   BUN 18 07/23/2020   NA 140 07/23/2020   K 4.4 07/23/2020   CL 102 07/23/2020   CO2 24 07/23/2020   Lab Results  Component Value Date    ALT 45 (H) 07/23/2020   AST 37 07/23/2020   ALKPHOS 149 (H) 07/23/2020   BILITOT 0.2 07/23/2020   Lab Results  Component Value Date   HGBA1C 6.8 (H) 08/25/2020   HGBA1C 8.7 (H) 05/28/2020   HGBA1C 8.8 (H) 02/21/2020   HGBA1C 10.5 (H) 11/15/2019   HGBA1C 9.6 (H) 08/13/2019   No results found for: INSULIN Lab Results  Component Value Date   TSH 1.790 07/23/2020   Lab Results  Component Value Date   CHOL 179 07/23/2020   HDL 88 07/23/2020   LDLCALC 74 07/23/2020   TRIG 99 07/23/2020   CHOLHDL 2.0 07/23/2020   Lab Results  Component Value Date   WBC 4.6 07/23/2020   HGB 12.7 07/23/2020   HCT 38.6 07/23/2020   MCV 85 07/23/2020   PLT 290 07/23/2020   Lab Results  Component Value Date   IRON 68 11/16/2017   TIBC 355 11/16/2017   FERRITIN 45.2 11/16/2017    Obesity Behavioral Intervention:   Approximately 15 minutes were spent on the discussion below.  ASK: We discussed the diagnosis of obesity with Leslie Davis today and Leslie Davis agreed to give [place] permission to discuss obesity behavioral modification therapy today.  ASSESS: Leslie Davis has the diagnosis of obesity and her BMI today is 34.6. Leslie Davis is in the action stage of change.   ADVISE: Leslie Davis was educated on the multiple health risks of obesity as well as the benefit of weight loss to improve her health. She was advised of the need for long term treatment and the importance of lifestyle modifications to improve her current health and to decrease her risk of future health problems.  AGREE: Multiple dietary modification options and treatment options were discussed and Leslie Davis agreed to follow the recommendations documented in the above note.  ARRANGE: Leslie Davis was educated on the importance of frequent visits to treat obesity as outlined per CMS and USPSTF guidelines and agreed to schedule her next follow up appointment today.  Attestation Statements:   Reviewed by clinician on day of visit: allergies,  medications, problem list, medical history, surgical history, family history, social history, and previous encounter notes.  Coral Ceo, CMA, am acting as transcriptionist for Mina Marble, NP.  I have reviewed the above documentation for accuracy and completeness, and I agree with the above. -  Evens Meno d. Kaisen Ackers, NP-C

## 2020-09-03 ENCOUNTER — Encounter: Payer: Self-pay | Admitting: Internal Medicine

## 2020-09-03 ENCOUNTER — Ambulatory Visit (INDEPENDENT_AMBULATORY_CARE_PROVIDER_SITE_OTHER): Payer: Medicare Other | Admitting: Orthopaedic Surgery

## 2020-09-03 ENCOUNTER — Encounter: Payer: Self-pay | Admitting: Orthopaedic Surgery

## 2020-09-03 ENCOUNTER — Ambulatory Visit: Payer: Self-pay

## 2020-09-03 VITALS — Ht 65.0 in | Wt 206.4 lb

## 2020-09-03 DIAGNOSIS — M1712 Unilateral primary osteoarthritis, left knee: Secondary | ICD-10-CM

## 2020-09-03 DIAGNOSIS — L738 Other specified follicular disorders: Secondary | ICD-10-CM | POA: Diagnosis not present

## 2020-09-03 NOTE — Progress Notes (Signed)
Office Visit Note   Patient: Leslie Davis           Date of Birth: March 24, 1955           MRN: [id number] Visit Date: 09/03/2020              Requested by: [name], [address] [place],  [place] [address] PCP: [name], MD   Assessment & Plan: Visit Diagnoses:  1. Primary osteoarthritis of left knee     Plan: Leslie Davis has end-stage left knee DJD.  At this point her A1c has come down to an acceptable level of 6.8.  She has not gotten any relief from conservative treatments therefore she has elected to move forward with a left total knee replacement.  In we can go ahead and start the process of scheduling surgery.  We will get the necessary clearance from Dr. Baird Cancer her PCP.  Details of the surgery including risk benefits rehab recovery time reviewed with the patient in detail.  Denies history of nickel allergy.  Currently takes a daily baby aspirin which we will increase to twice a day for 6 weeks postoperatively.  Follow-Up Instructions: Return for Postop.   Orders:  Orders Placed This Encounter  Procedures   XR KNEE 3 VIEW LEFT   No orders of the defined types were placed in this encounter.     Procedures: No procedures performed   Clinical Data: No additional findings.   Subjective: Chief Complaint  Patient presents with   Left Knee - Pain    Leslie Davis is a 65 year old female following up today for chronic left knee pain due to end-stage DJD.  Her knee replacement surgery was postponed due to an elevated A1c.  She made significant changes to her diet.   Review of Systems  Constitutional: Negative.   HENT: Negative.    Eyes: Negative.   Respiratory: Negative.    Cardiovascular: Negative.   Endocrine: Negative.   Musculoskeletal: Negative.   Neurological: Negative.   Hematological: Negative.   Psychiatric/Behavioral: Negative.    All other systems reviewed and are negative.   Objective: Vital Signs: Ht 5\' 5"  (1.651 m)    Wt 206 lb 6.4 oz (93.6 kg)   BMI 34.35 kg/m   Physical Exam Vitals and nursing note reviewed.  Constitutional:      Appearance: She is well-developed.  Pulmonary:     Effort: Pulmonary effort is normal.  Skin:    General: Skin is warm.     Capillary Refill: Capillary refill takes less than 2 seconds.  Neurological:     Mental Status: She is alert and oriented to person, place, and time.  Psychiatric:        Behavior: Behavior normal.        Thought Content: Thought content normal.        Judgment: Judgment normal.    Ortho Exam Left knee exam shows pain and crepitus with range of motion.  Collaterals and cruciates are stable.  Trace effusion. Specialty Comments:  No specialty comments available.  Imaging: XR KNEE 3 VIEW LEFT  Result Date: 09/03/2020 Advanced tricompartmental degenerative joint disease    PMFS History: Patient Active Problem List   Diagnosis Date Noted   NAFLD (nonalcoholic fatty liver disease) 08/06/2020   Other fatigue 07/23/2020   SOBOE (shortness of breath on exertion) 07/23/2020   Diabetes mellitus ([place]) 07/23/2020   Hypertension associated with type 2 diabetes mellitus ([place]) 07/23/2020   Hyperlipidemia associated with type  2 diabetes mellitus ([place]) 07/23/2020   Absolute anemia 07/23/2020   Vitamin D deficiency 07/23/2020   At risk for heart disease 07/23/2020   AKI (acute kidney injury) ([place]) 09/07/2019   Acute kidney injury ([place]) 09/06/2019   Bradycardia 09/06/2019   Hyperglycemia 09/06/2019   HLD (hyperlipidemia)    Hypertensive nephropathy 05/16/2019   Erosive gastritis 11/16/2017   Fatty liver 11/16/2017   Abdominal pain 12/11/2012   Early satiety 12/11/2012   Hiatal hernia    Hx of cholecystectomy 02/05/2011   Flu-like symptoms 02/05/2011   CONSTIPATION 08/27/2008   FATTY LIVER DISEASE 05/07/2008   Nausea with vomiting 04/23/2008   EPIGASTRIC PAIN 04/23/2008   RENAL CALCULUS 04/22/2008   Type 2 diabetes mellitus with stage 2  chronic kidney disease, with long-term current use of insulin ([place]) 05/19/2006   Type 2 diabetes mellitus with hyperlipidemia ([place]) 05/19/2006   HYPERTENSION, BENIGN SYSTEMIC 05/19/2006   GASTROESOPHAGEAL REFLUX, NO ESOPHAGITIS 05/19/2006   AMENORRHEA 05/19/2006   Past Medical History:  Diagnosis Date   DM (diabetes mellitus) ([place])    Fatty liver disease, nonalcoholic    Gastritis    Gastroparesis    GERD (gastroesophageal reflux disease)    Heartburn    Hiatal hernia    HLD (hyperlipidemia)    HTN (hypertension)    Joint pain    Renal calculus    Rheumatoid arthritis ([place])    Vitamin D deficiency     Family History  Problem Relation Age of Onset   Diabetes Mother    Thyroid disease Mother    Kidney disease Mother    Diabetes Maternal Grandmother    Stomach cancer Maternal Aunt        X 2 aunts   Hypertension Father    Hyperlipidemia Father    Sudden death Father    Stroke Father    Colon cancer Neg Hx    Esophageal cancer Neg Hx    Rectal cancer Neg Hx     Past Surgical History:  Procedure Laterality Date   CHOLECYSTECTOMY     COLONOSCOPY     TUBAL LIGATION     UPPER GASTROINTESTINAL ENDOSCOPY     Social History   Occupational History   Occupation: Housekeeping    Comment: Estate agent Service  Tobacco Use   Smoking status: Former    Packs/day: 0.25    Years: 1.00    Pack years: 0.25    Types: Cigarettes    Quit date: 09/10/1974    Years since quitting: 46.0   Smokeless tobacco: Never   Tobacco comments:    she no longer smokes.   Vaping Use   Vaping Use: Never used  Substance and Sexual Activity   Alcohol use: No   Drug use: No   Sexual activity: Not Currently

## 2020-09-06 MED ORDER — PREVNAR 20 0.5 ML IM SUSY: 0.5000 mL | PREFILLED_SYRINGE | INTRAMUSCULAR | 0 refills | Status: AC

## 2020-09-08 ENCOUNTER — Telehealth: Payer: Self-pay

## 2020-09-08 NOTE — Telephone Encounter (Signed)
The pt was notified that Dr Baird Cancer sent prevnar 20 mg to the pharmacy, the pt said that [facility] said that they don't give injections.

## 2020-09-10 ENCOUNTER — Other Ambulatory Visit: Payer: Self-pay

## 2020-09-10 ENCOUNTER — Ambulatory Visit (INDEPENDENT_AMBULATORY_CARE_PROVIDER_SITE_OTHER): Payer: Medicare Other | Admitting: Family Medicine

## 2020-09-10 ENCOUNTER — Telehealth: Payer: Self-pay | Admitting: Orthopaedic Surgery

## 2020-09-10 VITALS — BP 147/79 | HR 53 | Temp 98.4°F | Ht 64.0 in | Wt 198.0 lb

## 2020-09-10 DIAGNOSIS — E1169 Type 2 diabetes mellitus with other specified complication: Secondary | ICD-10-CM | POA: Diagnosis not present

## 2020-09-10 DIAGNOSIS — E559 Vitamin D deficiency, unspecified: Secondary | ICD-10-CM | POA: Diagnosis not present

## 2020-09-10 DIAGNOSIS — Z794 Long term (current) use of insulin: Secondary | ICD-10-CM | POA: Diagnosis not present

## 2020-09-10 DIAGNOSIS — Z6835 Body mass index (BMI) 35.0-35.9, adult: Secondary | ICD-10-CM | POA: Diagnosis not present

## 2020-09-10 MED ORDER — VITAMIN D3 1.25 MG (50000 UT) PO CAPS
ORAL_CAPSULE | ORAL | 0 refills | Status: DC
Start: 2020-09-10 — End: 2020-09-24

## 2020-09-10 MED ORDER — TOUJEO SOLOSTAR 300 UNIT/ML ~~LOC~~ SOPN: PEN_INJECTOR | SUBCUTANEOUS | 1 refills | Status: DC

## 2020-09-10 NOTE — Telephone Encounter (Signed)
Patient called and left voice mail message inquiring about a surgery date for left total knee arthroplasty with Dr. Erlinda Hong.  Call was returned however voicemail is full and I was unable to leave a message.  A request for clearance was sent to patient's PCP Bryon Lions on 09-03-20. Once patient has been cleared by PCP we can discuss a surgery date.

## 2020-09-17 NOTE — Progress Notes (Signed)
Chief Complaint:   OBESITY Leslie Davis is here to discuss her progress with her obesity treatment plan along with follow-up of her obesity related diagnoses.   Today's visit was #: 4 Starting weight: 208 lbs Starting date: 07/23/2020 Today's weight: 198 lbs Today's date: 09/10/2020 Weight change since last visit: 3 lbs Total lbs lost to date: 10 lbs Body mass index is 33.99 kg/m.  Total weight loss percentage to date: -4.81%  Interim History: Leslie Davis is here for a follow up office visit.  We reviewed her meal plan and questions were answered.  Patient's food recall appears to be accurate and consistent with what is on plan when she is following it.   When eating on plan, her hunger and cravings are well controlled.    Leslie Davis snacks on cheese sticks, fruit, and dried cereal.  Plan:  We discussed wraps, tortillas, and low calorie hamburger buns as options.  Current Meal Plan: the Category 1 Plan for 100% of the time.  Current Exercise Plan: Upper body for 30-60 minutes 1-2 times per week. Current Anti-Obesity Medications: Ozempic 1 mg subcutaneously weekly. Side effects: None.  Assessment/Plan:   Medications Discontinued During This Encounter  Medication Reason   Cholecalciferol (VITAMIN D3) 1.25 MG (50000 UT) CAPS Reorder   insulin glargine, 1 Unit Dial, (TOUJEO SOLOSTAR) 300 UNIT/ML Solostar Pen    Meds ordered this encounter  Medications   Cholecalciferol (VITAMIN D3) 1.25 MG (50000 UT) CAPS    Sig: Take 1 capsule by mouth every 2 wks    Dispense:  2 capsule    Refill:  0   insulin glargine, 1 Unit Dial, (TOUJEO SOLOSTAR) 300 UNIT/ML Solostar Pen    Sig: Inject 35 units subcutaneous at bedtime    Dispense:  22.5 mL    Refill:  1    Med RF per Dr Baird Cancer    1. Type 2 diabetes mellitus with other specified complication, with long-term current use of insulin (HCC) Diabetes Mellitus: Not at goal. Medication: Toujeo 35 units, Humalog, Ozemipc 1 mg subcutaneously  weekly. Issues reviewed: blood sugar goals, complications of diabetes mellitus, hypoglycemia prevention and treatment, exercise, and nutrition. FBS 98 highest, 74 lowest.  Occasionally gets sick on her stomach if she overeats with using her Ozempic.  Plan:  Decrease Toujeo from 40 to 35 units.  No longer using sliding scale insulin. The importance of regular follow up with PCP and all other specialists as scheduled was stressed to patient today. The patient will continue to focus on protein-rich, low simple carbohydrate foods. We reviewed the importance of hydration, regular exercise for stress reduction, and restorative sleep.   Lab Results  Component Value Date   HGBA1C 6.8 (H) 08/25/2020   HGBA1C 8.7 (H) 05/28/2020   HGBA1C 8.8 (H) 02/21/2020   Lab Results  Component Value Date   MICROALBUR 150 11/15/2019   LDLCALC 74 07/23/2020   CREATININE 1.00 07/23/2020   - Refill insulin glargine, 1 Unit Dial, (TOUJEO SOLOSTAR) 300 UNIT/ML Solostar Pen; Inject 35 units subcutaneous at bedtime  Dispense: 22.5 mL; Refill: 1  2. Vitamin D deficiency At goal.  She is taking vitamin D 50,000 IU every 2 weeks.  Plan: Continue to take prescription Vitamin D @50 ,000 IU every 2 weeks as prescribed.  Follow-up for routine testing of Vitamin D, at least 2-3 times per year to avoid over-replacement.  Lab Results  Component Value Date   VD25OH 112.0 (H) 07/23/2020   VD25OH 24.3 (L) 11/15/2019   -  Refill Cholecalciferol (VITAMIN D3) 1.25 MG (50000 UT) CAPS; Take 1 capsule by mouth every 2 wks  Dispense: 2 capsule; Refill: 0  3. Class 2 severe obesity with serious comorbidity and body mass index (BMI) of 35.0 to 35.9 in adult, unspecified obesity type [facility])  Course: Leslie Davis is currently in the action stage of change. As such, her goal is to continue with weight loss efforts.   Nutrition goals: She has agreed to the Category 1 Plan.   Exercise goals:  As is.  Behavioral modification strategies: meal  planning and cooking strategies, keeping healthy foods in the home, better snacking choices, celebration eating strategies, and avoiding temptations.  Leslie Davis has agreed to follow-up with our clinic in 2-3 weeks. She was informed of the importance of frequent follow-up visits to maximize her success with intensive lifestyle modifications for her multiple health conditions.   Objective:   Blood pressure (!) 147/79, pulse (!) 53, temperature 98.4 F (36.9 C), height 5\' 4"  (1.626 m), weight 198 lb (89.8 kg), SpO2 99 %. Body mass index is 33.99 kg/m.  General: Cooperative, alert, well developed, in no acute distress. HEENT: Conjunctivae and lids unremarkable. Cardiovascular: Regular rhythm.  Lungs: Normal work of breathing. Neurologic: No focal deficits.   Lab Results  Component Value Date   CREATININE 1.00 07/23/2020   BUN 18 07/23/2020   NA 140 07/23/2020   K 4.4 07/23/2020   CL 102 07/23/2020   CO2 24 07/23/2020   Lab Results  Component Value Date   ALT 45 (H) 07/23/2020   AST 37 07/23/2020   ALKPHOS 149 (H) 07/23/2020   BILITOT 0.2 07/23/2020   Lab Results  Component Value Date   HGBA1C 6.8 (H) 08/25/2020   HGBA1C 8.7 (H) 05/28/2020   HGBA1C 8.8 (H) 02/21/2020   HGBA1C 10.5 (H) 11/15/2019   HGBA1C 9.6 (H) 08/13/2019   Lab Results  Component Value Date   TSH 1.790 07/23/2020   Lab Results  Component Value Date   CHOL 179 07/23/2020   HDL 88 07/23/2020   LDLCALC 74 07/23/2020   TRIG 99 07/23/2020   CHOLHDL 2.0 07/23/2020   Lab Results  Component Value Date   VD25OH 112.0 (H) 07/23/2020   VD25OH 24.3 (L) 11/15/2019   Lab Results  Component Value Date   WBC 4.6 07/23/2020   HGB 12.7 07/23/2020   HCT 38.6 07/23/2020   MCV 85 07/23/2020   PLT 290 07/23/2020   Lab Results  Component Value Date   IRON 68 11/16/2017   TIBC 355 11/16/2017   FERRITIN 45.2 11/16/2017   Obesity Behavioral Intervention:   Approximately 15 minutes were spent on the  discussion below.  ASK: We discussed the diagnosis of obesity with Leslie Davis today and [name] agreed to give [place] permission to discuss obesity behavioral modification therapy today.  ASSESS: Joyce has the diagnosis of obesity and her BMI today is 34.1. Cherisa is in the action stage of change.   ADVISE: Meggie was educated on the multiple health risks of obesity as well as the benefit of weight loss to improve her health. She was advised of the need for long term treatment and the importance of lifestyle modifications to improve her current health and to decrease her risk of future health problems.  AGREE: Multiple dietary modification options and treatment options were discussed and Icela agreed to follow the recommendations documented in the above note.  ARRANGE: Stephonie was educated on the importance of frequent visits to treat obesity as outlined per CMS  and USPSTF guidelines and agreed to schedule her next follow up appointment today.  Attestation Statements:   Reviewed by clinician on day of visit: allergies, medications, problem list, medical history, surgical history, family history, social history, and previous encounter notes.  I, Water quality scientist, CMA, am acting as Location manager for [employer], DO.  I have reviewed the above documentation for accuracy and completeness, and I agree with the above. Marjory Sneddon, D.O.  The [place] was signed into law in 2016 which includes the topic of electronic health records.  This provides immediate access to information in MyChart.  This includes consultation notes, operative notes, office notes, lab results and pathology reports.  If you have any questions about what you read please let us know at your next visit so we can discuss your concerns and take corrective action if need be.  We are right here with you.

## 2020-09-19 ENCOUNTER — Ambulatory Visit (INDEPENDENT_AMBULATORY_CARE_PROVIDER_SITE_OTHER): Payer: Medicare Other

## 2020-09-19 ENCOUNTER — Telehealth: Payer: Medicare Other

## 2020-09-19 DIAGNOSIS — I129 Hypertensive chronic kidney disease with stage 1 through stage 4 chronic kidney disease, or unspecified chronic kidney disease: Secondary | ICD-10-CM

## 2020-09-19 DIAGNOSIS — N182 Chronic kidney disease, stage 2 (mild): Secondary | ICD-10-CM | POA: Diagnosis not present

## 2020-09-19 DIAGNOSIS — E1122 Type 2 diabetes mellitus with diabetic chronic kidney disease: Secondary | ICD-10-CM | POA: Diagnosis not present

## 2020-09-19 DIAGNOSIS — Z794 Long term (current) use of insulin: Secondary | ICD-10-CM

## 2020-09-24 ENCOUNTER — Other Ambulatory Visit: Payer: Self-pay

## 2020-09-24 ENCOUNTER — Ambulatory Visit (INDEPENDENT_AMBULATORY_CARE_PROVIDER_SITE_OTHER): Payer: Medicare Other | Admitting: Family Medicine

## 2020-09-24 ENCOUNTER — Encounter (INDEPENDENT_AMBULATORY_CARE_PROVIDER_SITE_OTHER): Payer: Self-pay | Admitting: Family Medicine

## 2020-09-24 VITALS — BP 123/69 | HR 53 | Temp 98.2°F | Ht 64.0 in | Wt 197.0 lb

## 2020-09-24 DIAGNOSIS — E1169 Type 2 diabetes mellitus with other specified complication: Secondary | ICD-10-CM | POA: Diagnosis not present

## 2020-09-24 DIAGNOSIS — E559 Vitamin D deficiency, unspecified: Secondary | ICD-10-CM

## 2020-09-24 DIAGNOSIS — Z794 Long term (current) use of insulin: Secondary | ICD-10-CM | POA: Diagnosis not present

## 2020-09-24 DIAGNOSIS — Z6835 Body mass index (BMI) 35.0-35.9, adult: Secondary | ICD-10-CM | POA: Diagnosis not present

## 2020-09-24 MED ORDER — VITAMIN D3 1.25 MG (50000 UT) PO CAPS: ORAL_CAPSULE | ORAL | 0 refills | Status: DC

## 2020-09-24 NOTE — Chronic Care Management (AMB) (Signed)
Chronic Care Management   CCM RN Visit Note  09/19/2020 Name: Leslie Davis MRN: [id number] DOB: [birth year]  Subjective: Leslie Davis is a 65 y.o. year old female who is a primary care patient of [name], MD. The care management team was consulted for assistance with disease management and care coordination needs.    Engaged with patient by telephone for follow up visit in response to provider referral for case management and/or care coordination services.   Consent to Services:  The patient was given information about Chronic Care Management services, agreed to services, and gave verbal consent prior to initiation of services.  Please see initial visit note for detailed documentation.   Patient agreed to services and verbal consent obtained.   Assessment: Review of patient past medical history, allergies, medications, health status, including review of consultants reports, laboratory and other test data, was performed as part of comprehensive evaluation and provision of chronic care management services.   SDOH (Social Determinants of Health) assessments and interventions performed:  Yes, no acute challenges   CCM Care Plan  Allergies  Allergen Reactions   Fiasp [Insulin Aspart (W-Niacinamide)] Hives    Outpatient Encounter Medications as of 09/19/2020  Medication Sig Note   acetaminophen-codeine (TYLENOL #3) 300-30 MG tablet Take 1 tablet by mouth every 6 (six) hours as needed for moderate pain.    amLODipine (NORVASC) 10 MG tablet Take 1 tablet (10 mg total) by mouth daily.    aspirin EC 81 MG tablet Take 1 tablet (81 mg total) by mouth daily.    Azilsartan Medoxomil (EDARBI) 80 MG TABS TAKE 1 TABLET EACH DAY.    benzonatate (TESSALON PERLES) 100 MG capsule Take 1 capsule (100 mg total) by mouth every 6 (six) hours as needed for cough. 07/23/2020: prn   Blood Glucose Monitoring Suppl (ONETOUCH VERIO) w/Device KIT Use as directed to check blood sugars 2 times per day  dx: e11.65    cetirizine (ZYRTEC) 10 MG tablet Take 1 tablet (10 mg total) by mouth daily.    ferrous gluconate (FERGON) 324 MG tablet TAKE 1 TABLET BY MOUTH TWICE DAILY WITH A MEAL.    glucose blood (ONETOUCH VERIO) test strip Use as instructed to check blood sugars 2 times per day dx:e11.65    insulin glargine, 1 Unit Dial, (TOUJEO SOLOSTAR) 300 UNIT/ML Solostar Pen Inject 35 units subcutaneous at bedtime    insulin lispro (HUMALOG KWIKPEN) 100 UNIT/ML KwikPen Sliding scale    Insulin Pen Needle 32G X 6 MM MISC Use with pen injectors daily dx code: e11.9    Magnesium 400 MG TABS One tab po qpm    OneTouch Delica Lancets 40J MISC Use as directed to check blood sugars 2 times per day dx:e11.65    pantoprazole (PROTONIX) 40 MG tablet Take one tablet by mouth 3 days a week    rosuvastatin (CRESTOR) 20 MG tablet Take 1 tablet (20 mg total) by mouth daily.    Semaglutide, 1 MG/DOSE, (OZEMPIC, 1 MG/DOSE,) 4 MG/3ML SOPN Inject 1 mg into the skin once a week.    temazepam (RESTORIL) 30 MG capsule One capsules po qhs prn    traMADol (ULTRAM) 50 MG tablet Take 50 mg by mouth every 6 (six) hours as needed.    [DISCONTINUED] Cholecalciferol (VITAMIN D3) 1.25 MG (50000 UT) CAPS Take 1 capsule by mouth every 2 wks    No facility-administered encounter medications on file as of 09/19/2020.    Patient Active Problem List   Diagnosis Date  Noted   NAFLD (nonalcoholic fatty liver disease) 08/06/2020   Other fatigue 07/23/2020   SOBOE (shortness of breath on exertion) 07/23/2020   Diabetes mellitus ([place]) 07/23/2020   Hypertension associated with type 2 diabetes mellitus ([place]) 07/23/2020   Hyperlipidemia associated with type 2 diabetes mellitus ([place]) 07/23/2020   Absolute anemia 07/23/2020   Vitamin D deficiency 07/23/2020   At risk for heart disease 07/23/2020   AKI (acute kidney injury) ([place]) 09/07/2019   Acute kidney injury ([place]) 09/06/2019   Bradycardia 09/06/2019   Hyperglycemia 09/06/2019   HLD  (hyperlipidemia)    Hypertensive nephropathy 05/16/2019   Erosive gastritis 11/16/2017   Fatty liver 11/16/2017   Abdominal pain 12/11/2012   Early satiety 12/11/2012   Hiatal hernia    Hx of cholecystectomy 02/05/2011   Flu-like symptoms 02/05/2011   CONSTIPATION 08/27/2008   FATTY LIVER DISEASE 05/07/2008   Nausea with vomiting 04/23/2008   EPIGASTRIC PAIN 04/23/2008   RENAL CALCULUS 04/22/2008   Type 2 diabetes mellitus with stage 2 chronic kidney disease, with long-term current use of insulin ([place]) 05/19/2006   Type 2 diabetes mellitus with hyperlipidemia ([place]) 05/19/2006   HYPERTENSION, BENIGN SYSTEMIC 05/19/2006   GASTROESOPHAGEAL REFLUX, NO ESOPHAGITIS 05/19/2006   AMENORRHEA 05/19/2006    Conditions to be addressed/monitored: Type 2 diabetes mellitus, Hypertensive nephropathy, Chronic renal failure  Care Plan : Hypertension (Adult)  Updates made by Lynne Logan, RN since 09/24/2020 12:00 AM     Problem: Hypertension (Hypertension)   Priority: High     Long-Range Goal: Hypertension Monitored   Start Date: 04/21/2020  Expected End Date: 04/21/2021  Recent Progress: On track  Priority: High  Note:   Objective:  Last practice recorded BP readings:  BP Readings from Last 3 Encounters:  09/24/20 123/69  09/10/20 (!) 147/79  08/27/20 (!) 149/77   Most recent eGFR/CrCl:  Lab Results  Component Value Date   EGFR 62 07/23/2020    No components found for: CRCL Current Barriers:  Knowledge Deficits related to basic understanding of hypertension pathophysiology and self care management Knowledge Deficits related to understanding of medications prescribed for management of hypertension Case Manager Clinical Goal(s):  Patient will demonstrate improved adherence to prescribed treatment plan for hypertension as evidenced by taking all medications as prescribed, monitoring and recording blood pressure as directed, adhering to low sodium/DASH diet Interventions:  09/19/20  completed successful outbound call with patient  Collaboration with [name], MD regarding development and update of comprehensive plan of care as evidenced by provider attestation and co-signature Inter-disciplinary care team collaboration (see longitudinal plan of care) Evaluation of current treatment plan related to hypertension self management and patient's adherence to plan as established by provider. Provided education to patient re: stroke prevention, s/s of heart attack and stroke, DASH diet, complications of uncontrolled blood pressure Reviewed medications with patient and discussed importance of compliance Advised patient, providing education and rationale, to monitor blood pressure daily and record, calling PCP for findings outside established parameters.  Discussed plans with patient for ongoing care management follow up and provided patient with direct contact information for care management team Patient Goals/Self-Care Activities Self administers medications as prescribed Attends all scheduled provider appointments Calls provider office for new concerns, questions, or BP outside discussed parameters Checks BP and records as discussed Follows a low sodium diet/DASH diet  Follow Up Plan: Telephone follow up appointment with care management team member scheduled for: 11/25/20    Care Plan : Osteoarthritis (Adult)  Updates made by Barb Merino  L, RN since 09/24/2020 12:00 AM     Problem: Mobility and Function (Osteoarthritis)   Priority: High     Long-Range Goal: Maintain Mobility and Function   Start Date: 09/19/2020  Expected End Date: 09/21/2021  Recent Progress: On track  Priority: High  Note:   Current Barriers:  Ineffective Self Health Maintenance Currently UNABLE TO independently self manage needs related to chronic health conditions.  Knowledge Deficits related to short term plan for care coordination needs and long term plans for chronic disease management  needs Case Manager Clinical Goal(s):  Collaboration with [name], MD regarding development and update of comprehensive plan of care as evidenced by provider attestation and co-signature Inter-disciplinary care team collaboration (see longitudinal plan of care) Patient will work with care management team to address care coordination and chronic disease management needs related to Disease Management Educational Needs Care Coordination Medication Management and Education Psychosocial Support   Interventions:  09/19/20 Successful outbound call completed with patient  Assessed for adherence to prescribed treatment plan by provider for Osteoarthritis Assessed impact of pain, loss of strength and potential disfigurement (hand osteoarthritis) on mental health and daily life, such as social isolation, anxiety, depression, impaired sexual relationship and  or injury from falls Determined patient completed outpatient PT for improved strength of left knee and continues to follow her HEP Determined patient continues to follow up with Dr. Erlinda Hong for ongoing evaluation and treatment of Osteoarthritis  Discussed plans to continue conservative therapy to help avoid surgical intervention Assessed for barriers to participation in therapy or exercise, such as pain with activity, anticipated or imagined pain, no barriers identified Discussed plans with patient for ongoing care management follow up and provided patient with direct contact information for care management team Patient Goals/Self Care Activities:   Continue to follow HEP as directed Keep all MD follow up appointments as scheduled Take pain medication exactly as prescribed for left knee pain  Follow Up Plan: Telephone follow up appointment with care management team member scheduled for: 11/25/20    Care Plan : Diabetes Type 2 (Adult)  Updates made by Lynne Logan, RN since 09/24/2020 12:00 AM     Problem: Glycemic Management (Diabetes, Type 2)    Priority: Medium     Long-Range Goal: Glycemic Management Optimized   Start Date: 09/19/2020  Expected End Date: 09/21/2021  This Visit's Progress: On track  Priority: Medium  Note:   Objective:  Lab Results  Component Value Date   HGBA1C 6.8 (H) 08/25/2020   Lab Results  Component Value Date   CREATININE 1.00 07/23/2020   CREATININE 1.15 (H) 02/21/2020   CREATININE 1.11 (H) 11/15/2019   Lab Results  Component Value Date   EGFR 62 07/23/2020   Current Barriers:  Knowledge Deficits related to basic Diabetes pathophysiology and self care/management Knowledge Deficits related to medications used for management of diabetes Case Manager Clinical Goal(s):  patient will demonstrate improved adherence to prescribed treatment plan for diabetes self care/management as evidenced by: daily monitoring and recording of CBG  adherence to ADA/ carb modified diet exercise 5 days/week adherence to prescribed medication regimen contacting provider for new or worsened symptoms or questions Interventions:  09/19/20 completed successful outbound call with patient  Collaboration with [name], MD regarding development and update of comprehensive plan of care as evidenced by provider attestation and co-signature Inter-disciplinary care team collaboration (see longitudinal plan of care) Provided education to patient about basic DM disease process Review of patient status, including review of consultant's  reports, relevant laboratory and other test results, and medications completed. Reviewed medications with patient and discussed importance of medication adherence Educated patient on dietary and exercise recommendations; daily glycemic control FBS 80-130, <180 after meals;15'15' rule Advised patient, providing education and rationale, to check cbg daily before meals and at bedtime and record, calling the CCM team and or PCP for findings outside established parameters Discussed plans with patient for  ongoing care management follow up and provided patient with direct contact information for care management team Self-Care Activities Self administers oral medications as prescribed Self administers insulin as prescribed Attends all scheduled provider appointments Checks blood sugars as prescribed and utilize hyper and hypoglycemia protocol as needed Adheres to prescribed ADA/carb modified Patient Goals: - check blood sugar at prescribed times - check blood sugar before and after exercise - check blood sugar if I feel it is too high or too low - enter blood sugar readings and medication or insulin into daily log - take the blood sugar log to all doctor visits - take the blood sugar meter to all doctor visits - manage portion size - keep appointment with eye doctor - schedule appointment with eye doctor - check feet daily for cuts, sores or redness - do heel pump exercise 2 to 3 times each day - keep feet up while sitting - trim toenails straight across - wash and dry feet carefully every day - wear comfortable, cotton socks - wear comfortable, well-fitting shoes  Follow Up Plan: Telephone follow up appointment with care management team member scheduled for: 11/25/20     Plan:Telephone follow up appointment with care management team member scheduled for:  11/25/20  Barb Merino, RN, BSN, CCM Care Management Coordinator [place] [facility]  Direct Phone: [phone]

## 2020-09-24 NOTE — Patient Instructions (Addendum)
Goals Addressed     Osteoarthritis      Maintain Mobility and Function   On track     Timeframe:  Long-Range Goal Priority:  Medium Start Date: 09/19/20                            Expected End Date: 09/21/21  Next Scheduled follow up: 11/25/20        Patient Goals/Self Care Activities:   Continue to follow HEP as directed Keep all MD follow up appointments as scheduled Take pain medication exactly as prescribed for left knee pain                      Monitor and Manage My Blood Sugar-Diabetes Type 2   On track     Timeframe:  Long-Range Goal Priority:  Medium Start Date: 09/19/20                            Expected End Date: 09/19/21                      Follow Up Date 11/25/20    - check blood sugar at prescribed times - check blood sugar before and after exercise - check blood sugar if I feel it is too high or too low - enter blood sugar readings and medication or insulin into daily log - take the blood sugar log to all doctor visits - take the blood sugar meter to all doctor visits    Why is this important?   Checking your blood sugar at home helps to keep it from getting very high or very low.  Writing the results in a diary or log helps the doctor know how to care for you.  Your blood sugar log should have the time, date and the results.  Also, write down the amount of insulin or other medicine that you take.  Other information, like what you ate, exercise done and how you were feeling, will also be helpful.     Notes:        Track and Manage My Blood Pressure-Hypertension   On track     Timeframe:  Long-Range Goal Priority:  High Start Date: 04/21/20                            Expected End Date: 04/21/21                      Follow Up Date:   Self administers medications as prescribed Attends all scheduled provider appointments Calls provider office for new concerns, questions, or BP outside discussed parameters Checks BP and records as discussed Follows a low sodium  diet/DASH diet   Why is this important?   You won't feel high blood pressure, but it can still hurt your blood vessels.  High blood pressure can cause heart or kidney problems. It can also cause a stroke.  Making lifestyle changes like losing a Davan Hark weight or eating less salt will help.  Checking your blood pressure at home and at different times of the day can help to control blood pressure.  If the doctor prescribes medicine remember to take it the way the doctor ordered.  Call the office if you cannot afford the medicine or if there are questions about it.  Notes:

## 2020-09-29 ENCOUNTER — Telehealth: Payer: Self-pay

## 2020-09-29 ENCOUNTER — Other Ambulatory Visit: Payer: Self-pay

## 2020-09-29 DIAGNOSIS — Z794 Long term (current) use of insulin: Secondary | ICD-10-CM

## 2020-09-29 DIAGNOSIS — E1169 Type 2 diabetes mellitus with other specified complication: Secondary | ICD-10-CM

## 2020-09-29 MED ORDER — TOUJEO SOLOSTAR 300 UNIT/ML ~~LOC~~ SOPN: PEN_INJECTOR | SUBCUTANEOUS | 1 refills | Status: DC

## 2020-09-29 NOTE — Chronic Care Management (AMB) (Signed)
Chronic Care Management Pharmacy Assistant   Name: Leslie Davis  MRN: [id number] DOB: 1955/04/22   Reason for Encounter: Medication Review/ Medication Coordination   Recent office visits:  09-19-2020 Riley Churches, RN (CCM)  Recent consult visits:  09-03-2020 Tarry Kos, MD (Orthopedic). XR knee ordered.  09-10-2020 Thomasene Lot, DO ([facility]). CHANGED Toujeo 40 units and bedtime TO 35 units.  09-24-2020 Thomasene Lot, DO ([facility] weight [facility]). Vitamin D 25 Hydroxy orders placed.  Hospital visits:  None in previous 6 months  Medications: Outpatient Encounter Medications as of 09/29/2020  Medication Sig Note   acetaminophen-codeine (TYLENOL #3) 300-30 MG tablet Take 1 tablet by mouth every 6 (six) hours as needed for moderate pain.    amLODipine (NORVASC) 10 MG tablet Take 1 tablet (10 mg total) by mouth daily.    aspirin EC 81 MG tablet Take 1 tablet (81 mg total) by mouth daily.    Azilsartan Medoxomil (EDARBI) 80 MG TABS TAKE 1 TABLET EACH DAY.    benzonatate (TESSALON PERLES) 100 MG capsule Take 1 capsule (100 mg total) by mouth every 6 (six) hours as needed for cough. 07/23/2020: prn   Blood Glucose Monitoring Suppl (ONETOUCH VERIO) w/Device KIT Use as directed to check blood sugars 2 times per day dx: e11.65    cetirizine (ZYRTEC) 10 MG tablet Take 1 tablet (10 mg total) by mouth daily.    Cholecalciferol (VITAMIN D3) 1.25 MG (50000 UT) CAPS Take 1 capsule by mouth every 2 wks    ferrous gluconate (FERGON) 324 MG tablet TAKE 1 TABLET BY MOUTH TWICE DAILY WITH A MEAL.    glucose blood (ONETOUCH VERIO) test strip Use as instructed to check blood sugars 2 times per day dx:e11.65    insulin glargine, 1 Unit Dial, (TOUJEO SOLOSTAR) 300 UNIT/ML Solostar Pen Inject 35 units subcutaneous at bedtime    insulin lispro (HUMALOG KWIKPEN) 100 UNIT/ML KwikPen Sliding scale    Insulin Pen Needle 32G X 6 MM MISC Use with pen injectors daily dx code: e11.9     Magnesium 400 MG TABS One tab po qpm    OneTouch Delica Lancets 33G MISC Use as directed to check blood sugars 2 times per day dx:e11.65    pantoprazole (PROTONIX) 40 MG tablet Take one tablet by mouth 3 days a week    rosuvastatin (CRESTOR) 20 MG tablet Take 1 tablet (20 mg total) by mouth daily.    Semaglutide, 1 MG/DOSE, (OZEMPIC, 1 MG/DOSE,) 4 MG/3ML SOPN Inject 1 mg into the skin once a week.    temazepam (RESTORIL) 30 MG capsule One capsules po qhs prn    traMADol (ULTRAM) 50 MG tablet Take 50 mg by mouth every 6 (six) hours as needed.    No facility-administered encounter medications on file as of 09/29/2020.  Reviewed chart for medication changes ahead of medication coordination call.  No OVs, Consults, or hospital visits since last care coordination call/Pharmacist visit. (If appropriate, list visit date, provider name)  No medication changes indicated OR if recent visit, treatment plan here.  BP Readings from Last 3 Encounters:  09/24/20 123/69  09/10/20 (!) 147/79  08/27/20 (!) 149/77    Lab Results  Component Value Date   HGBA1C 6.8 (H) 08/25/2020     Patient obtains medications through Vials  90 Days   Last adherence delivery included:  Ozempic 2/1.5 ml- Inject 0.25 mg sq once a week Toujeo -50 units sq nightly Trueplus Pen 31g 65mm- Use with pen  to inject insulin daily. Rosuvastatin 20 mg- 1 tablet daily ASA 81 mg- 1 tablet daily Edarbi 80 mg- 1 tablet daily  Amlodipine 10 mg -1 tablet daily Protonix 40 mg- 1 tablet 3 times weekly  Patient declined the following medications last month: Onetouch Veriotest strips- Patient has 2 bottles left  Patient is due for next adherence delivery on: 10-08-2020  Called patient and reviewed medications and coordinated delivery.  This delivery to include: Toujeo 35 units at bedtime Temazepam 30 mg daily Acetaminophen-Codeine 300-30 mg every 6 hours as needed for moderate pain. Derma smooth oil Test strips  No short or  acute fill needed   Patient declined the following medications (meds) due to (reason): Ozempic 2/1.5 ml- Inject 0.25 mg sq once a week Toujeo -50 units sq nightly Trueplus Pen 31g 2mm- Use with pen to inject insulin daily. Rosuvastatin 20 mg- 1 tablet daily ASA 81 mg- 1 tablet daily Edarbi 80 mg- 1 tablet daily  Amlodipine 10 mg -1 tablet daily Protonix 40 mg- 1 tablet 3 times weekly Do to receiving a 90 day supply on 09-07-2020  Patient needs refills for: Toujeo 35 units Tylenol #3  Confirmed delivery date of 10-08-2020, advised patient that pharmacy will contact them the morning of delivery.   NOTES: Sent PCP a refill request for Toujeo 35 units. Sent Patrice Paradise a message to request refills for Tylenol #3 from [place], [place].  Care Gaps: PCV13 overdue Moderna booster overdue Ophthalmology exam overdue RAF= 0.755  Star Rating Drugs: Rosuvastatin 20 MG- Last filled 09-04-2020 [facility] Ozempic 1 MG- Last filled 09-02-2020 [facility] Azilsartan 80 MG- Last filled 09-04-2020 [facility]  [place] Clinical Pharmacist Assistant [phone]

## 2020-09-30 ENCOUNTER — Other Ambulatory Visit: Payer: Self-pay | Admitting: Physician Assistant

## 2020-10-02 ENCOUNTER — Telehealth: Payer: Self-pay | Admitting: Orthopaedic Surgery

## 2020-10-02 NOTE — Telephone Encounter (Signed)
Please advise 

## 2020-10-02 NOTE — Telephone Encounter (Signed)
Pt called asking for a refill of her tylenol #3 rx; and would like a CB when this has been sent in.   [phone]

## 2020-10-02 NOTE — Progress Notes (Signed)
Chief Complaint:   OBESITY Leslie Davis is here to discuss her progress with her obesity treatment plan along with follow-up of her obesity related diagnoses.   Today's visit was #: 5 Starting weight: 208 lbs Starting date: 07/23/2020 Today's weight: 197 lbs Today's date: 09/24/2020 Weight change since last visit: 1 lb Total lbs lost to date: 11 lbs Body mass index is 33.81 kg/m.  Total weight loss percentage to date: -5.29%  Interim History:  Leslie Davis is here for a follow up office visit.  We reviewed her meal plan and questions were answered.  Patient's food recall appears to be accurate and consistent with what is on plan when she is following it.   When eating on plan, her hunger and cravings are well controlled.    [name] says she only eats out once a month and has baked fish usually.  Current Meal Plan: the Category 1 Plan for 90% of the time.   Current Exercise Plan: Upper body exercises at the gym for 90 minutes 1 time per week.  Current Anti-Obesity Medications: Ozempic 1 mg subcutaneously weekly. Side effects: None.    Assessment/Plan:   Orders Placed This Encounter  Procedures   VITAMIN D 25 Hydroxy (Vit-D Deficiency, Fractures)   Medications Discontinued During This Encounter  Medication Reason   Cholecalciferol (VITAMIN D3) 1.25 MG (50000 UT) CAPS Reorder   Meds ordered this encounter  Medications   Cholecalciferol (VITAMIN D3) 1.25 MG (50000 UT) CAPS    Sig: Take 1 capsule by mouth every 2 wks    Dispense:  2 capsule    Refill:  0   1. Type 2 diabetes mellitus with other specified complication, with long-term current use of insulin (HCC) Diabetes Mellitus: Not at goal. Medication: Humalog, Toujeo, Ozempic 1 mg subcutaneously weekly. Issues reviewed: blood sugar goals, complications of diabetes mellitus, hypoglycemia prevention and treatment, exercise, and nutrition.  FBS 82-103.  Stable.  No lows or highs.  We decreased Toujeo to 35 units last  office visit and she is doing well.  Still not using SSI at all and Ozempic causes nausea occasionally if she eats too much or eats the wrong things.  Plan:  Continue Ozempic at same dose as well as insulin at same dose.  Continue prudent nutritional plan.  Continue weight loss and goal is to decrease insulin and use Ozempic/others for blood sugar control.  Lab Results  Component Value Date   HGBA1C 6.8 (H) 08/25/2020   HGBA1C 8.7 (H) 05/28/2020   HGBA1C 8.8 (H) 02/21/2020   Lab Results  Component Value Date   MICROALBUR 150 11/15/2019   LDLCALC 74 07/23/2020   CREATININE 1.00 07/23/2020   2. Vitamin D deficiency At goal.  She is taking vitamin D 50,000 IU every 2 weeks.  Her compliance with the regimen is excellent.  Plan: Continue to take prescription Vitamin D @50 ,000 IU every 2 week as prescribed.  Will check vitamin D level at next office visit since dose was decreased 2-3 months ago.    Lab Results  Component Value Date   VD25OH 112.0 (H) 07/23/2020   VD25OH 24.3 (L) 11/15/2019   - Refill Cholecalciferol (VITAMIN D3) 1.25 MG (50000 UT) CAPS; Take 1 capsule by mouth every 2 wks  Dispense: 2 capsule; Refill: 0  - VITAMIN D 25 Hydroxy (Vit-D Deficiency, Fractures)  3. Class 2 severe obesity with serious comorbidity and body mass index (BMI) of 35.0 to 35.9 in adult, unspecified obesity type ([place])  Course: Leslie Davis is currently in the action stage of change. As such, her goal is to continue with weight loss efforts.   Nutrition goals: She has agreed to the Category 1 Plan.   Exercise goals: Older adults should follow the adult guidelines. When older adults cannot meet the adult guidelines, they should be as physically active as their abilities and conditions will allow.  Older adults should do exercises that maintain or improve balance if they are at risk of falling.   Behavioral modification strategies: decreasing simple carbohydrates, better snacking choices, and planning for  success.  Kyree has agreed to follow-up with our clinic in 2-3 weeks. She was informed of the importance of frequent follow-up visits to maximize her success with intensive lifestyle modifications for her multiple health conditions.   Objective:   Blood pressure 123/69, pulse (!) 53, temperature 98.2 F (36.8 C), height 5\' 4"  (1.626 m), weight 197 lb (89.4 kg), SpO2 98 %. Body mass index is 33.81 kg/m.  General: Cooperative, alert, well developed, in no acute distress. HEENT: Conjunctivae and lids unremarkable. Cardiovascular: Regular rhythm.  Lungs: Normal work of breathing. Neurologic: No focal deficits.   Lab Results  Component Value Date   CREATININE 1.00 07/23/2020   BUN 18 07/23/2020   NA 140 07/23/2020   K 4.4 07/23/2020   CL 102 07/23/2020   CO2 24 07/23/2020   Lab Results  Component Value Date   ALT 45 (H) 07/23/2020   AST 37 07/23/2020   ALKPHOS 149 (H) 07/23/2020   BILITOT 0.2 07/23/2020   Lab Results  Component Value Date   HGBA1C 6.8 (H) 08/25/2020   HGBA1C 8.7 (H) 05/28/2020   HGBA1C 8.8 (H) 02/21/2020   HGBA1C 10.5 (H) 11/15/2019   HGBA1C 9.6 (H) 08/13/2019   Lab Results  Component Value Date   TSH 1.790 07/23/2020   Lab Results  Component Value Date   CHOL 179 07/23/2020   HDL 88 07/23/2020   LDLCALC 74 07/23/2020   TRIG 99 07/23/2020   CHOLHDL 2.0 07/23/2020   Lab Results  Component Value Date   VD25OH 112.0 (H) 07/23/2020   VD25OH 24.3 (L) 11/15/2019   Lab Results  Component Value Date   WBC 4.6 07/23/2020   HGB 12.7 07/23/2020   HCT 38.6 07/23/2020   MCV 85 07/23/2020   PLT 290 07/23/2020   Lab Results  Component Value Date   IRON 68 11/16/2017   TIBC 355 11/16/2017   FERRITIN 45.2 11/16/2017   Obesity Behavioral Intervention:   Approximately 15 minutes were spent on the discussion below.  ASK: We discussed the diagnosis of obesity with [name] today and [name] agreed to give Leslie Davis permission to discuss obesity behavioral  modification therapy today.  ASSESS: Leslie Davis has the diagnosis of obesity and her BMI today is 33.8. Leslie Davis is in the action stage of change.   ADVISE: Leslie Davis was educated on the multiple health risks of obesity as well as the benefit of weight loss to improve her health. She was advised of the need for long term treatment and the importance of lifestyle modifications to improve her current health and to decrease her risk of future health problems.  AGREE: Multiple dietary modification options and treatment options were discussed and [place] agreed to follow the recommendations documented in the above note.  ARRANGE: L[place] was educated on the importance of frequent visits to treat obesity as outlined per CMS and USPSTF guidelines and agreed to schedule her next follow up appointment today.  Attestation  Statements:   Reviewed by clinician on day of visit: allergies, medications, problem list, medical history, surgical history, family history, social history, and previous encounter notes.  I, Water quality scientist, CMA, am acting as Location manager for [employer], DO.  I have reviewed the above documentation for accuracy and completeness, and I agree with the above. Marjory Sneddon, D.O.  The [place] was signed into law in 2016 which includes the topic of electronic health records.  This provides immediate access to information in MyChart.  This includes consultation notes, operative notes, office notes, lab results and pathology reports.  If you have any questions about what you read please let Leslie Davis know at your next visit so we can discuss your concerns and take corrective action if need be.  We are right here with you.

## 2020-10-03 ENCOUNTER — Other Ambulatory Visit: Payer: Self-pay | Admitting: Physician Assistant

## 2020-10-03 MED ORDER — ACETAMINOPHEN-CODEINE #3 300-30 MG PO TABS: 1.0000 | ORAL_TABLET | Freq: Four times a day (QID) | ORAL | 0 refills | Status: DC | PRN

## 2020-10-03 NOTE — Telephone Encounter (Signed)
Lvm informing 

## 2020-10-03 NOTE — Telephone Encounter (Signed)
Sent in

## 2020-10-06 ENCOUNTER — Telehealth: Payer: Self-pay

## 2020-10-06 NOTE — Chronic Care Management (AMB) (Signed)
Completed patient's no fill report for test strips. Patient will be receiving them on next delivery on 10-08-2020.  [place] Pharmacist Assistant [phone]

## 2020-10-08 ENCOUNTER — Encounter (INDEPENDENT_AMBULATORY_CARE_PROVIDER_SITE_OTHER): Payer: Self-pay | Admitting: Family Medicine

## 2020-10-08 ENCOUNTER — Ambulatory Visit (INDEPENDENT_AMBULATORY_CARE_PROVIDER_SITE_OTHER): Payer: Medicare Other | Admitting: Family Medicine

## 2020-10-08 ENCOUNTER — Other Ambulatory Visit: Payer: Self-pay

## 2020-10-08 VITALS — BP 138/70 | HR 69 | Temp 98.0°F | Ht 64.0 in | Wt 192.0 lb

## 2020-10-08 DIAGNOSIS — Z794 Long term (current) use of insulin: Secondary | ICD-10-CM

## 2020-10-08 DIAGNOSIS — E669 Obesity, unspecified: Secondary | ICD-10-CM | POA: Diagnosis not present

## 2020-10-08 DIAGNOSIS — E1169 Type 2 diabetes mellitus with other specified complication: Secondary | ICD-10-CM

## 2020-10-08 DIAGNOSIS — E559 Vitamin D deficiency, unspecified: Secondary | ICD-10-CM

## 2020-10-08 DIAGNOSIS — Z6833 Body mass index (BMI) 33.0-33.9, adult: Secondary | ICD-10-CM

## 2020-10-08 MED ORDER — VITAMIN D3 1.25 MG (50000 UT) PO CAPS: ORAL_CAPSULE | ORAL | 0 refills | Status: DC

## 2020-10-09 ENCOUNTER — Encounter: Payer: Self-pay | Admitting: Nurse Practitioner

## 2020-10-09 ENCOUNTER — Telehealth (INDEPENDENT_AMBULATORY_CARE_PROVIDER_SITE_OTHER): Payer: Medicare Other | Admitting: Nurse Practitioner

## 2020-10-09 VITALS — BP 154/80 | HR 67 | Temp 97.0°F

## 2020-10-09 DIAGNOSIS — R059 Cough, unspecified: Secondary | ICD-10-CM | POA: Diagnosis not present

## 2020-10-09 DIAGNOSIS — R0989 Other specified symptoms and signs involving the circulatory and respiratory systems: Secondary | ICD-10-CM | POA: Diagnosis not present

## 2020-10-09 MED ORDER — HYDROCODONE BIT-HOMATROP MBR 5-1.5 MG/5ML PO SOLN: 5.0000 mL | Freq: Four times a day (QID) | ORAL | 0 refills | Status: DC | PRN

## 2020-10-09 MED ORDER — AZITHROMYCIN 250 MG PO TABS: ORAL_TABLET | ORAL | 0 refills | Status: AC

## 2020-10-09 NOTE — Progress Notes (Addendum)
Telephone Visit    This visit type was conducted due to national recommendations for restrictions regarding the COVID-19 Pandemic (e.g. social distancing) in an effort to limit this patient's exposure and mitigate transmission in our community.  Due to her co-morbid illnesses, this patient is at least at moderate risk for complications without adequate follow up.  This format is felt to be most appropriate for this patient at this time.  All issues noted in this document were discussed and addressed.  A limited physical exam was performed with this format.    This visit type was conducted due to national recommendations for restrictions regarding the COVID-19 Pandemic (e.g. social distancing) in an effort to limit this patient's exposure and mitigate transmission in our community.  Patients identity confirmed using two different identifiers.  This format is felt to be most appropriate for this patient at this time.  All issues noted in this document were discussed and addressed.  No physical exam was performed (except for noted visual exam findings with Video Visits).    Connected with Leslie Davis via telephone on 10/09/2020 with 2 patient identifiers  Date:  11/14/2020   ID:  Leslie Davis, DOB 16-Jul-1955, MRN [id number]  Patient Location:  Home - spoke with Leslie Davis  Provider location:   Office    Chief Complaint:  cough and shortness of breath  History of Present Illness:    Leslie Davis is a 65 y.o. female who presents via telephone conferencing for a telehealth visit today.    The patient does have symptoms concerning for COVID-19 infection (fever, chills, cough, or new shortness of breath).   She is having shortness of breath when walking,  she is also fatigued, cough. Denies fever, headache when coughing, denies diarrhea.  She has not taken a covid test and does not have any rapid test at home. She has taken mucinex for congestion and cough. Cough is keeping  her up at night. Blood sugar was 88 and not over 100.     Past Medical History:  Diagnosis Date   DM (diabetes mellitus) ([place])    Fatty liver disease, nonalcoholic    Gastritis    Gastroparesis    GERD (gastroesophageal reflux disease)    Heartburn    Hiatal hernia    HLD (hyperlipidemia)    HTN (hypertension)    Joint pain    Renal calculus    Rheumatoid arthritis ([place])    Vitamin D deficiency    Past Surgical History:  Procedure Laterality Date   CHOLECYSTECTOMY     COLONOSCOPY     TOTAL KNEE ARTHROPLASTY Left 10/27/2020   Procedure: LEFT TOTAL KNEE ARTHROPLASTY;  Surgeon: [name], MD;  Location: [facility];  Service: Orthopedics;  Laterality: Left;   TUBAL LIGATION     UPPER GASTROINTESTINAL ENDOSCOPY       Current Meds  Medication Sig   amLODipine (NORVASC) 10 MG tablet Take 1 tablet (10 mg total) by mouth daily.   Azilsartan Medoxomil (EDARBI) 80 MG TABS TAKE 1 TABLET EACH DAY.   [EXPIRED] azithromycin (ZITHROMAX) 250 MG tablet Take 2 tablets (500 mg) on  Day 1,  followed by 1 tablet (250 mg) once daily on Days 2 through 5.   Blood Glucose Monitoring Suppl (ONETOUCH VERIO) w/Device KIT Use as directed to check blood sugars 2 times per day dx: e11.65   Cholecalciferol (VITAMIN D3) 1.25 MG (50000 UT) CAPS Take 1 capsule by mouth every 2 wks (Patient taking differently:  Take 50,000 Units by mouth every 14 (fourteen) days.)   ferrous gluconate (FERGON) 324 MG tablet TAKE 1 TABLET BY MOUTH TWICE DAILY WITH A MEAL. (Patient taking differently: Take 324 mg by mouth 2 (two) times daily with a meal.)   glucose blood (ONETOUCH VERIO) test strip Use as instructed to check blood sugars 2 times per day dx:e11.65   HYDROcodone bit-homatropine (HYDROMET) 5-1.5 MG/5ML syrup Take 5 mLs by mouth every 6 (six) hours as needed for cough.   insulin glargine, 1 Unit Dial, (TOUJEO SOLOSTAR) 300 UNIT/ML Solostar Pen Inject 35 units subcutaneous at bedtime   insulin lispro (HUMALOG KWIKPEN) 100  UNIT/ML KwikPen Sliding scale (Patient taking differently: Inject 4-8 Units into the skin daily as needed (CBG over 150). Sliding scale)   Insulin Pen Needle 32G X 6 MM MISC Use with pen injectors daily dx code: T02.4   OneTouch Delica Lancets 09B MISC Use as directed to check blood sugars 2 times per day dx:e11.65   pantoprazole (PROTONIX) 40 MG tablet Take one tablet by mouth 3 days a week (Patient taking differently: Take 40 mg by mouth 3 (three) times a week.)   rosuvastatin (CRESTOR) 20 MG tablet Take 1 tablet (20 mg total) by mouth daily.   Semaglutide, 1 MG/DOSE, (OZEMPIC, 1 MG/DOSE,) 4 MG/3ML SOPN Inject 1 mg into the skin once a week. (Patient taking differently: Inject 1 mg into the skin once a week. Tuesday)   temazepam (RESTORIL) 30 MG capsule One capsules po qhs prn (Patient taking differently: Take 30 mg by mouth at bedtime as needed for sleep.)   [DISCONTINUED] acetaminophen-codeine (TYLENOL #3) 300-30 MG tablet Take 1 tablet by mouth every 6 (six) hours as needed for moderate pain.   [DISCONTINUED] aspirin EC 81 MG tablet Take 1 tablet (81 mg total) by mouth daily.   [DISCONTINUED] traMADol (ULTRAM) 50 MG tablet Take 50 mg by mouth every 6 (six) hours as needed. (Patient not taking: Reported on 10/22/2020)     Allergies:   Fiasp [insulin aspart (w-niacinamide)]   Social History   Tobacco Use   Smoking status: Former    Packs/day: 0.25    Years: 1.00    Pack years: 0.25    Types: Cigarettes    Quit date: 09/10/1974    Years since quitting: 46.2   Smokeless tobacco: Never   Tobacco comments:    she no longer smokes.   Vaping Use   Vaping Use: Never used  Substance Use Topics   Alcohol use: No   Drug use: No     Family Hx: The patient's family history includes Diabetes in her maternal grandmother and mother; Hyperlipidemia in her father; Hypertension in her father; Kidney disease in her mother; Stomach cancer in her maternal aunt; Stroke in her father; Sudden death in her  father; Thyroid disease in her mother. There is no history of Colon cancer, Esophageal cancer, or Rectal cancer.  ROS:   Please see the history of present illness.    Review of Systems  Constitutional:  Negative for chills, fever and malaise/fatigue.  Respiratory:  Positive for cough, sputum production (a little bit - beige color) and shortness of breath.   Cardiovascular:  Negative for chest pain and palpitations.  Gastrointestinal:  Negative for constipation and diarrhea.  Neurological:  Negative for dizziness and headaches.  Psychiatric/Behavioral:  Negative for depression.    All other systems reviewed and are negative.   Labs/Other Tests and Data Reviewed:    Recent Labs: 07/23/2020: TSH 1.790  10/22/2020: ALT 45 10/28/2020: BUN 9; Creatinine, Ser 1.00; Hemoglobin 11.6; Platelets 232; Potassium 3.6; Sodium 134   Recent Lipid Panel Lab Results  Component Value Date/Time   CHOL 179 07/23/2020 10:42 AM   TRIG 99 07/23/2020 10:42 AM   HDL 88 07/23/2020 10:42 AM   CHOLHDL 2.0 07/23/2020 10:42 AM   CHOLHDL 2.8 09/13/2007 04:05 AM   LDLCALC 74 07/23/2020 10:42 AM    Wt Readings from Last 3 Encounters:  10/27/20 193 lb (87.5 kg)  10/22/20 196 lb 14.4 oz (89.3 kg)  10/22/20 193 lb (87.5 kg)     Exam:    Vital Signs:  BP (!) 154/80   Pulse 67   Temp (!) 97 F (36.1 C)     Physical Exam Constitutional:      General: She is not in acute distress. Pulmonary:     Effort: Pulmonary effort is normal. No respiratory distress.  Neurological:     Mental Status: She is alert and oriented to person, place, and time. Mental status is at baseline.     Cranial Nerves: No cranial nerve deficit.  Psychiatric:        Mood and Affect: Mood and affect normal.        Behavior: Behavior normal.        Thought Content: Thought content normal.        Cognition and Memory: Memory normal.        Judgment: Judgment normal.    ASSESSMENT & PLAN:    1. Chest congestion She will come to the  office for a covid test Will also treat for possible URI - azithromycin (ZITHROMAX) 250 MG tablet; Take 2 tablets (500 mg) on  Day 1,  followed by 1 tablet (250 mg) once daily on Days 2 through 5.  Dispense: 6 each; Refill: 0 - Novel Coronavirus, NAA ([facility])  2. Cough If symptoms worsen return call to office or go to ER  - azithromycin (ZITHROMAX) 250 MG tablet; Take 2 tablets (500 mg) on  Day 1,  followed by 1 tablet (250 mg) once daily on Days 2 through 5.  Dispense: 6 each; Refill: 0 - HYDROcodone bit-homatropine (HYDROMET) 5-1.5 MG/5ML syrup; Take 5 mLs by mouth every 6 (six) hours as needed for cough.  Dispense: 120 mL; Refill: 0 - Novel Coronavirus, NAA ([facility])   COVID-19 Education: The signs and symptoms of COVID-19 were discussed with the patient and how to seek care for testing (follow up with PCP or arrange E-visit).  The importance of social distancing was discussed today.  Patient Risk:   After full review of this patients clinical status, I feel that they are at least moderate risk at this time.  Time:   Today, I have spent 11 minutes/ seconds with the patient with telephone  discussing above diagnoses.     Medication Adjustments/Labs and Tests Ordered: Current medicines are reviewed at length with the patient today.  Concerns regarding medicines are outlined above.   Tests Ordered: Orders Placed This Encounter  Procedures   Novel Coronavirus, NAA ([facility])   SARS-COV-2, NAA 2 DAY TAT     Medication Changes: Meds ordered this encounter  Medications   azithromycin (ZITHROMAX) 250 MG tablet    Sig: Take 2 tablets (500 mg) on  Day 1,  followed by 1 tablet (250 mg) once daily on Days 2 through 5.    Dispense:  6 each    Refill:  0   HYDROcodone bit-homatropine (HYDROMET) 5-1.5 MG/5ML syrup  Sig: Take 5 mLs by mouth every 6 (six) hours as needed for cough.    Dispense:  120 mL    Refill:  0     Disposition:  Follow up prn  Signed, [name], FNP

## 2020-10-10 LAB — NOVEL CORONAVIRUS, NAA: SARS-CoV-2, NAA: NOT DETECTED

## 2020-10-10 LAB — SARS-COV-2, NAA 2 DAY TAT

## 2020-10-15 ENCOUNTER — Telehealth: Payer: Self-pay

## 2020-10-15 NOTE — Telephone Encounter (Signed)
Patient returned call regarding  covid results and said she is feeling fine. YL,RMA

## 2020-10-15 NOTE — Telephone Encounter (Signed)
You tried to call her 3 times about her covid results which were negative and that is great she is feeling better.

## 2020-10-16 NOTE — Progress Notes (Signed)
Chief Complaint:   OBESITY Leslie Davis is here to discuss her progress with her obesity treatment plan along with follow-up of her obesity related diagnoses.   Today's visit was #: 6 Starting weight: 208 lbs Starting date: 07/23/2020 Today's weight: 192 lbs Today's date: 10/08/2020 Weight change since last visit: 5 lbs Total lbs lost to date: 16 lbs Body mass index is 32.96 kg/m.  Total weight loss percentage to date: -7.69%  Interim History:  Leslie Davis is here for a follow up office visit.  We reviewed her meal plan and questions were answered.  Patient's food recall appears to be accurate and consistent with what is on plan when she is following it.   When eating on plan, her hunger and cravings are well controlled.    Difficult to eat all breakfast, so she has peanut butter on toast.  She is not measuring how much.  Current Meal Plan: the Category 1 Plan for 100% of the time.  Current Exercise Plan: None. Current Anti-Obesity Medications: Ozempic 1 mg subcutaneously weekly. Side effects: None.  Assessment/Plan:   Medications Discontinued During This Encounter  Medication Reason   Cholecalciferol (VITAMIN D3) 1.25 MG (50000 UT) CAPS Reorder   Meds ordered this encounter  Medications   Cholecalciferol (VITAMIN D3) 1.25 MG (50000 UT) CAPS    Sig: Take 1 capsule by mouth every 2 wks    Dispense:  2 capsule    Refill:  0    1. Type 2 diabetes mellitus with other specified complication, with long-term current use of insulin (HCC) Diabetes Mellitus: Not at goal. Medication: Ozempic 1 mg subcutaneously weekly, Toujeo 35 units subcutaneously at bedtime, Humalog sliding scale. Issues reviewed: blood sugar goals, complications of diabetes mellitus, hypoglycemia prevention and treatment, exercise, and nutrition.  FBS 80-90s.  No lows.  This morning 101.  No concerns.  Plan:  Discussed with patient weaning Toujeo by a couple units every week or so to keep blood sugar stable  while losing weight. The importance of regular follow up with PCP and all other specialists as scheduled was stressed to patient today. The patient will continue to focus on protein-rich, low simple carbohydrate foods. We reviewed the importance of hydration, regular exercise for stress reduction, and restorative sleep.   Lab Results  Component Value Date   HGBA1C 6.8 (H) 08/25/2020   HGBA1C 8.7 (H) 05/28/2020   HGBA1C 8.8 (H) 02/21/2020   Lab Results  Component Value Date   MICROALBUR 150 11/15/2019   LDLCALC 74 07/23/2020   CREATININE 1.00 07/23/2020   2. Vitamin D deficiency At goal.  She is taking vitamin D 50,000 IU every 2 weeks.  Tolerating well, without issues.   Plan: Continue to take prescription Vitamin D '@50'$ ,000 IU every 2 weeks as prescribed.  Follow-up for routine testing of Vitamin D, at least 2-3 times per year to avoid over-replacement.  Lab Results  Component Value Date   VD25OH 112.0 (H) 07/23/2020   VD25OH 24.3 (L) 11/15/2019   - Refill Cholecalciferol (VITAMIN D3) 1.25 MG (50000 UT) CAPS; Take 1 capsule by mouth every 2 wks  Dispense: 2 capsule; Refill: 0  3. Obesity with current BMI 33.1  Course: Leslie Davis is currently in the action stage of change. As such, her goal is to continue with weight loss efforts.   Nutrition goals: She has agreed to the Category 1 Plan.   Exercise goals: All adults should avoid inactivity. Some physical activity is better than none, and adults  who participate in any amount of physical activity gain some health benefits. May start walking.  Behavioral modification strategies: decreasing simple carbohydrates, meal planning and cooking strategies, keeping healthy foods in the home, and better snacking choices.  Leslie Davis has agreed to follow-up with our clinic in 2-3 weeks. She was informed of the importance of frequent follow-up visits to maximize her success with intensive lifestyle modifications for her multiple health conditions.    Objective:   Blood pressure 138/70, pulse 69, temperature 98 F (36.7 C), height '5\' 4"'$  (1.626 m), weight 192 lb (87.1 kg), SpO2 98 %. Body mass index is 32.96 kg/m.  General: Cooperative, alert, well developed, in no acute distress. HEENT: Conjunctivae and lids unremarkable. Cardiovascular: Regular rhythm.  Lungs: Normal work of breathing. Neurologic: No focal deficits.   Lab Results  Component Value Date   CREATININE 1.00 07/23/2020   BUN 18 07/23/2020   NA 140 07/23/2020   K 4.4 07/23/2020   CL 102 07/23/2020   CO2 24 07/23/2020   Lab Results  Component Value Date   ALT 45 (H) 07/23/2020   AST 37 07/23/2020   ALKPHOS 149 (H) 07/23/2020   BILITOT 0.2 07/23/2020   Lab Results  Component Value Date   HGBA1C 6.8 (H) 08/25/2020   HGBA1C 8.7 (H) 05/28/2020   HGBA1C 8.8 (H) 02/21/2020   HGBA1C 10.5 (H) 11/15/2019   HGBA1C 9.6 (H) 08/13/2019   Lab Results  Component Value Date   TSH 1.790 07/23/2020   Lab Results  Component Value Date   CHOL 179 07/23/2020   HDL 88 07/23/2020   LDLCALC 74 07/23/2020   TRIG 99 07/23/2020   CHOLHDL 2.0 07/23/2020   Lab Results  Component Value Date   VD25OH 112.0 (H) 07/23/2020   VD25OH 24.3 (L) 11/15/2019   Lab Results  Component Value Date   WBC 4.6 07/23/2020   HGB 12.7 07/23/2020   HCT 38.6 07/23/2020   MCV 85 07/23/2020   PLT 290 07/23/2020   Lab Results  Component Value Date   IRON 68 11/16/2017   TIBC 355 11/16/2017   FERRITIN 45.2 11/16/2017   Obesity Behavioral Intervention:   Approximately 15 minutes were spent on the discussion below.  ASK: We discussed the diagnosis of obesity with Leslie Davis today and Leslie Davis agreed to give [place] permission to discuss obesity behavioral modification therapy today.  ASSESS: Maleaha has the diagnosis of obesity and her BMI today is 33.1. Leslie Davis is in the action stage of change.   ADVISE: Leslie Davis was educated on the multiple health risks of obesity as well as the benefit  of weight loss to improve her health. She was advised of the need for long term treatment and the importance of lifestyle modifications to improve her current health and to decrease her risk of future health problems.  AGREE: Multiple dietary modification options and treatment options were discussed and Laxmi agreed to follow the recommendations documented in the above note.  ARRANGE: Bellatrix was educated on the importance of frequent visits to treat obesity as outlined per CMS and USPSTF guidelines and agreed to schedule her next follow up appointment today.  Attestation Statements:   Reviewed by clinician on day of visit: allergies, medications, problem list, medical history, surgical history, family history, social history, and previous encounter notes.  I, Water quality scientist, CMA, am acting as Location manager for [employer], DO.  I have reviewed the above documentation for accuracy and completeness, and I agree with the above. Marjory Sneddon, D.O.  The [place] was signed into law in 2016 which includes the topic of electronic health records.  This provides immediate access to information in MyChart.  This includes consultation notes, operative notes, office notes, lab results and pathology reports.  If you have any questions about what you read please let us know at your next visit so we can discuss your concerns and take corrective action if need be.  We are right here with you.

## 2020-10-21 ENCOUNTER — Other Ambulatory Visit: Payer: Self-pay | Admitting: Physician Assistant

## 2020-10-21 MED ORDER — OXYCODONE-ACETAMINOPHEN 5-325 MG PO TABS: 1.0000 | ORAL_TABLET | Freq: Four times a day (QID) | ORAL | 0 refills | Status: DC | PRN

## 2020-10-21 MED ORDER — SULFAMETHOXAZOLE-TRIMETHOPRIM 800-160 MG PO TABS: 1.0000 | ORAL_TABLET | Freq: Two times a day (BID) | ORAL | 0 refills | Status: DC

## 2020-10-21 MED ORDER — ASPIRIN EC 81 MG PO TBEC: 81.0000 mg | DELAYED_RELEASE_TABLET | Freq: Two times a day (BID) | ORAL | 0 refills | Status: DC

## 2020-10-21 MED ORDER — DOCUSATE SODIUM 100 MG PO CAPS: 100.0000 mg | ORAL_CAPSULE | Freq: Every day | ORAL | 2 refills | Status: DC | PRN

## 2020-10-21 MED ORDER — ONDANSETRON HCL 4 MG PO TABS: 4.0000 mg | ORAL_TABLET | Freq: Three times a day (TID) | ORAL | 0 refills | Status: DC | PRN

## 2020-10-21 MED ORDER — METHOCARBAMOL 500 MG PO TABS: 500.0000 mg | ORAL_TABLET | Freq: Two times a day (BID) | ORAL | 0 refills | Status: DC | PRN

## 2020-10-21 NOTE — Progress Notes (Signed)
Surgical Instructions    Your procedure is scheduled on 10/27/20.  Report to [facility] Main Entrance "A" at 10:30 A.M., then check in with the Admitting office.  Call this number if you have problems the morning of surgery:  [phone]   If you have any questions prior to your surgery date call [phone]: Open Monday-Friday 8am-4pm    Remember:  Do not eat after midnight the night before your surgery  You may drink clear liquids until 09:30am the morning of your surgery.   Clear liquids allowed are: Water, Non-Citrus Juices (without pulp), Carbonated Beverages, Clear Tea, Black Coffee Only, and Gatorade  Patient Instructions  The night before surgery:  No food after midnight. ONLY clear liquids after midnight   The day of surgery (if you have diabetes): Drink ONE (1) 12 oz G2 given to you in your pre admission testing appointment by 09:30am the morning of surgery. Drink in one sitting. Do not sip.  This drink was given to you during your hospital  pre-op appointment visit.  Nothing else to drink after completing the  12 oz bottle of G2.         If you have questions, please contact your surgeon's office.     Take these medicines the morning of surgery with A SIP OF WATER  acetaminophen-codeine (TYLENOL #3) if needed amLODipine (NORVASC) HYDROcodone bit-homatropine (HYDROMET) if needed pantoprazole (PROTONIX) if taken on the surgery day traMADol (ULTRAM) if needed   As of today, STOP taking any Aspirin (unless otherwise instructed by your surgeon) Aleve, Naproxen, Ibuprofen, Motrin, Advil, Goody's, BC's, all herbal medications, fish oil, and all vitamins.  WHAT DO I DO ABOUT MY DIABETES MEDICATION?   Do not take oral diabetes medicines (pills) the morning of surgery.      THE MORNING OF SURGERY, do not take Semaglutide, 1 MG/DOSE, (OZEMPIC, 1 MG/DOSE,).  The day of surgery, do not take other diabetes injectables, including Byetta (exenatide), Bydureon (exenatide  ER), Victoza (liraglutide), or Trulicity (dulaglutide).  If your CBG is greater than 220 mg/dL, you may take  of your insulin lispro (HUMALOG KWIKPEN) .  HOW TO MANAGE YOUR DIABETES BEFORE AND AFTER SURGERY  Why is it important to control my blood sugar before and after surgery? Improving blood sugar levels before and after surgery helps healing and can limit problems. A way of improving blood sugar control is eating a healthy diet by:  Eating less sugar and carbohydrates  Increasing activity/exercise  Talking with your doctor about reaching your blood sugar goals High blood sugars (greater than 180 mg/dL) can raise your risk of infections and slow your recovery, so you will need to focus on controlling your diabetes during the weeks before surgery. Make sure that the doctor who takes care of your diabetes knows about your planned surgery including the date and location.  How do I manage my blood sugar before surgery? Check your blood sugar at least 4 times a day, starting 2 days before surgery, to make sure that the level is not too high or low.  Check your blood sugar the morning of your surgery when you wake up and every 2 hours until you get to the Short Stay unit.  If your blood sugar is less than 70 mg/dL, you will need to treat for low blood sugar: Do not take insulin. Treat a low blood sugar (less than 70 mg/dL) with  cup of clear juice (cranberry or apple), 4 glucose tablets, OR glucose gel. Recheck blood sugar  in 15 minutes after treatment (to make sure it is greater than 70 mg/dL). If your blood sugar is not greater than 70 mg/dL on recheck, call [phone] for further instructions. Report your blood sugar to the short stay nurse when you get to Short Stay.  If you are admitted to the hospital after surgery: Your blood sugar will be checked by the staff and you will probably be given insulin after surgery (instead of oral diabetes medicines) to make sure you have good blood  sugar levels. The goal for blood sugar control after surgery is 80-180 mg/dL.           Do not wear jewelry or makeup Do not wear lotions, powders, perfumes/colognes, or deodorant. Do not shave 48 hours prior to surgery.   Do not bring valuables to the hospital.  DO Not wear nail polish, gel polish, artificial nails, or any other type of covering on natural nails  including finger and toenails. If patients have artificial nails, gel coating, etc. that need to be removed by a nail salon please have this removed prior to surgery or surgery may need to be canceled/delayed if the surgeon/ anesthesia feels like the patient is unable to be adequately monitored.             [facility] is not responsible for any belongings or valuables.  Do NOT Smoke (Tobacco/Vaping) or drink Alcohol 24 hours prior to your procedure If you use a CPAP at night, you may bring all equipment for your overnight stay.   Contacts, glasses, dentures or bridgework may not be worn into surgery, please bring cases for these belongings   For patients admitted to the hospital, discharge time will be determined by your treatment team.   Patients discharged the day of surgery will not be allowed to drive home, and someone needs to stay with them for 24 hours.  ONLY 1 SUPPORT PERSON MAY BE PRESENT WHILE YOU ARE IN SURGERY. IF YOU ARE TO BE ADMITTED ONCE YOU ARE IN YOUR ROOM YOU WILL BE ALLOWED TWO (2) VISITORS.  Minor children may have two parents present. Special consideration for safety and communication needs will be reviewed on a case by case basis.  Special instructions:    Oral Hygiene is also important to reduce your risk of infection.  Remember - BRUSH YOUR TEETH THE MORNING OF SURGERY WITH YOUR REGULAR TOOTHPASTE   [facility]- Preparing For Surgery  Before surgery, you can play an important role. Because skin is not sterile, your skin needs to be as free of germs as possible. You can reduce the number of germs on  your skin by washing with CHG (chlorahexidine gluconate) Soap before surgery.  CHG is an antiseptic cleaner which kills germs and bonds with the skin to continue killing germs even after washing.     Please do not use if you have an allergy to CHG or antibacterial soaps. If your skin becomes reddened/irritated stop using the CHG.  Do not shave (including legs and underarms) for at least 48 hours prior to first CHG shower. It is OK to shave your face.  Please follow these instructions carefully.     Shower the NIGHT BEFORE SURGERY and the MORNING OF SURGERY with CHG Soap.   If you chose to wash your hair, wash your hair first as usual with your normal shampoo. After you shampoo, rinse your hair and body thoroughly to remove the shampoo.  Then [employer] and genitals (private parts) with your  normal soap and rinse thoroughly to remove soap.  After that Use CHG Soap as you would any other liquid soap. You can apply CHG directly to the skin and wash gently with a scrungie or a clean washcloth.   Apply the CHG Soap to your body ONLY FROM THE NECK DOWN.  Do not use on open wounds or open sores. Avoid contact with your eyes, ears, mouth and genitals (private parts). Wash Face and genitals (private parts)  with your normal soap.   Wash thoroughly, paying special attention to the area where your surgery will be performed.  Thoroughly rinse your body with warm water from the neck down.  DO NOT shower/wash with your normal soap after using and rinsing off the CHG Soap.  Pat yourself dry with a CLEAN TOWEL.  Wear CLEAN PAJAMAS to bed the night before surgery  Place CLEAN SHEETS on your bed the night before your surgery  DO NOT SLEEP WITH PETS.   Day of Surgery: Take a shower with CHG soap. Wear Clean/Comfortable clothing the morning of surgery Do not apply any deodorants/lotions.   Remember to brush your teeth WITH YOUR REGULAR TOOTHPASTE.   Please read over the following fact sheets that you  were given.

## 2020-10-22 ENCOUNTER — Other Ambulatory Visit: Payer: Self-pay

## 2020-10-22 ENCOUNTER — Ambulatory Visit (INDEPENDENT_AMBULATORY_CARE_PROVIDER_SITE_OTHER): Payer: Medicare Other | Admitting: Family Medicine

## 2020-10-22 ENCOUNTER — Encounter (HOSPITAL_COMMUNITY)
Admission: RE | Admit: 2020-10-22 | Discharge: 2020-10-22 | Disposition: A | Discharge status: Home / Self Care | Payer: Medicare Other | Source: Ambulatory Visit | Attending: Physician Assistant | Admitting: Physician Assistant

## 2020-10-22 ENCOUNTER — Encounter (HOSPITAL_COMMUNITY)
Admission: RE | Admit: 2020-10-22 | Discharge: 2020-10-22 | Disposition: A | Discharge status: Home / Self Care | Payer: Medicare Other | Source: Ambulatory Visit | Attending: Orthopaedic Surgery | Admitting: Orthopaedic Surgery

## 2020-10-22 ENCOUNTER — Encounter (INDEPENDENT_AMBULATORY_CARE_PROVIDER_SITE_OTHER): Payer: Self-pay | Admitting: Family Medicine

## 2020-10-22 ENCOUNTER — Telehealth: Payer: Self-pay | Admitting: Orthopaedic Surgery

## 2020-10-22 ENCOUNTER — Other Ambulatory Visit (HOSPITAL_COMMUNITY): Payer: Medicare Other

## 2020-10-22 ENCOUNTER — Encounter (HOSPITAL_COMMUNITY): Payer: Self-pay

## 2020-10-22 VITALS — BP 156/84 | HR 53 | Temp 97.9°F | Ht 64.0 in | Wt 193.0 lb

## 2020-10-22 DIAGNOSIS — Z794 Long term (current) use of insulin: Secondary | ICD-10-CM | POA: Insufficient documentation

## 2020-10-22 DIAGNOSIS — Z7982 Long term (current) use of aspirin: Secondary | ICD-10-CM | POA: Insufficient documentation

## 2020-10-22 DIAGNOSIS — Z791 Long term (current) use of non-steroidal anti-inflammatories (NSAID): Secondary | ICD-10-CM | POA: Diagnosis not present

## 2020-10-22 DIAGNOSIS — I152 Hypertension secondary to endocrine disorders: Secondary | ICD-10-CM | POA: Diagnosis not present

## 2020-10-22 DIAGNOSIS — E1143 Type 2 diabetes mellitus with diabetic autonomic (poly)neuropathy: Secondary | ICD-10-CM | POA: Insufficient documentation

## 2020-10-22 DIAGNOSIS — M1712 Unilateral primary osteoarthritis, left knee: Secondary | ICD-10-CM | POA: Diagnosis not present

## 2020-10-22 DIAGNOSIS — M069 Rheumatoid arthritis, unspecified: Secondary | ICD-10-CM | POA: Insufficient documentation

## 2020-10-22 DIAGNOSIS — K5900 Constipation, unspecified: Secondary | ICD-10-CM | POA: Diagnosis not present

## 2020-10-22 DIAGNOSIS — Z7984 Long term (current) use of oral hypoglycemic drugs: Secondary | ICD-10-CM | POA: Insufficient documentation

## 2020-10-22 DIAGNOSIS — E559 Vitamin D deficiency, unspecified: Secondary | ICD-10-CM

## 2020-10-22 DIAGNOSIS — E1159 Type 2 diabetes mellitus with other circulatory complications: Secondary | ICD-10-CM | POA: Diagnosis not present

## 2020-10-22 DIAGNOSIS — Z6833 Body mass index (BMI) 33.0-33.9, adult: Secondary | ICD-10-CM | POA: Insufficient documentation

## 2020-10-22 DIAGNOSIS — K7581 Nonalcoholic steatohepatitis (NASH): Secondary | ICD-10-CM | POA: Insufficient documentation

## 2020-10-22 DIAGNOSIS — Z01818 Encounter for other preprocedural examination: Secondary | ICD-10-CM | POA: Diagnosis not present

## 2020-10-22 DIAGNOSIS — Z792 Long term (current) use of antibiotics: Secondary | ICD-10-CM | POA: Diagnosis not present

## 2020-10-22 DIAGNOSIS — Z6835 Body mass index (BMI) 35.0-35.9, adult: Secondary | ICD-10-CM

## 2020-10-22 DIAGNOSIS — K219 Gastro-esophageal reflux disease without esophagitis: Secondary | ICD-10-CM | POA: Insufficient documentation

## 2020-10-22 DIAGNOSIS — I10 Essential (primary) hypertension: Secondary | ICD-10-CM | POA: Diagnosis not present

## 2020-10-22 DIAGNOSIS — Z79891 Long term (current) use of opiate analgesic: Secondary | ICD-10-CM | POA: Insufficient documentation

## 2020-10-22 DIAGNOSIS — K3184 Gastroparesis: Secondary | ICD-10-CM | POA: Insufficient documentation

## 2020-10-22 DIAGNOSIS — E669 Obesity, unspecified: Secondary | ICD-10-CM | POA: Insufficient documentation

## 2020-10-22 DIAGNOSIS — E785 Hyperlipidemia, unspecified: Secondary | ICD-10-CM | POA: Insufficient documentation

## 2020-10-22 LAB — APTT: aPTT: 27 seconds (ref 24–36)

## 2020-10-22 LAB — CBC WITH DIFFERENTIAL/PLATELET
Abs Immature Granulocytes: 0.01 10*3/uL (ref 0.00–0.07)
Basophils Absolute: 0 10*3/uL (ref 0.0–0.1)
Basophils Relative: 1 %
Eosinophils Absolute: 0.2 10*3/uL (ref 0.0–0.5)
Eosinophils Relative: 3 %
HCT: 38.3 % (ref 36.0–46.0)
Hemoglobin: 12.3 g/dL (ref 12.0–15.0)
Immature Granulocytes: 0 %
Lymphocytes Relative: 42 %
Lymphs Abs: 2.3 10*3/uL (ref 0.7–4.0)
MCH: 28.3 pg (ref 26.0–34.0)
MCHC: 32.1 g/dL (ref 30.0–36.0)
MCV: 88 fL (ref 80.0–100.0)
Monocytes Absolute: 0.8 10*3/uL (ref 0.1–1.0)
Monocytes Relative: 13 %
Neutro Abs: 2.3 10*3/uL (ref 1.7–7.7)
Neutrophils Relative %: 41 %
Platelets: 294 10*3/uL (ref 150–400)
RBC: 4.35 MIL/uL (ref 3.87–5.11)
RDW: 14.2 % (ref 11.5–15.5)
WBC: 5.6 10*3/uL (ref 4.0–10.5)
nRBC: 0 % (ref 0.0–0.2)

## 2020-10-22 LAB — GLUCOSE, CAPILLARY: Glucose-Capillary: 84 mg/dL (ref 70–99)

## 2020-10-22 LAB — COMPREHENSIVE METABOLIC PANEL
ALT: 45 U/L — ABNORMAL HIGH (ref 0–44)
AST: 51 U/L — ABNORMAL HIGH (ref 15–41)
Albumin: 3.5 g/dL (ref 3.5–5.0)
Alkaline Phosphatase: 131 U/L — ABNORMAL HIGH (ref 38–126)
Anion gap: 9 (ref 5–15)
BUN: 20 mg/dL (ref 8–23)
CO2: 23 mmol/L (ref 22–32)
Calcium: 9.6 mg/dL (ref 8.9–10.3)
Chloride: 106 mmol/L (ref 98–111)
Creatinine, Ser: 1.3 mg/dL — ABNORMAL HIGH (ref 0.44–1.00)
GFR, Estimated: 45 mL/min — ABNORMAL LOW (ref >60)
Glucose, Bld: 80 mg/dL (ref 70–99)
Potassium: 3.9 mmol/L (ref 3.5–5.1)
Sodium: 138 mmol/L (ref 135–145)
Total Bilirubin: 0.4 mg/dL (ref 0.3–1.2)
Total Protein: 7 g/dL (ref 6.5–8.1)

## 2020-10-22 LAB — URINALYSIS, ROUTINE W REFLEX MICROSCOPIC
Bilirubin Urine: NEGATIVE
Glucose, UA: NEGATIVE mg/dL
Hgb urine dipstick: NEGATIVE
Ketones, ur: NEGATIVE mg/dL
Nitrite: NEGATIVE
Protein, ur: >=300 mg/dL — AB
Specific Gravity, Urine: 1.025 (ref 1.005–1.030)
pH: 5 (ref 5.0–8.0)

## 2020-10-22 LAB — PROTIME-INR
INR: 1 (ref 0.8–1.2)
Prothrombin Time: 12.9 seconds (ref 11.4–15.2)

## 2020-10-22 LAB — HEMOGLOBIN A1C
Hgb A1c MFr Bld: 6.3 % — ABNORMAL HIGH (ref 4.8–5.6)
Mean Plasma Glucose: 134.11 mg/dL

## 2020-10-22 LAB — SURGICAL PCR SCREEN
MRSA, PCR: NEGATIVE
Staphylococcus aureus: NEGATIVE

## 2020-10-22 LAB — TYPE AND SCREEN
ABO/RH(D): A POS
Antibody Screen: NEGATIVE

## 2020-10-22 MED ORDER — LINACLOTIDE 145 MCG PO CAPS
145.0000 ug | ORAL_CAPSULE | Freq: Every day | ORAL | 0 refills | Status: DC
Start: 2020-10-22 — End: 2021-03-31

## 2020-10-22 NOTE — Telephone Encounter (Signed)
Can you send in cipro please.  I don't think she has a UTI but with her diabetes, rather be safe.  Thanks.

## 2020-10-22 NOTE — Telephone Encounter (Signed)
Patient is scheduled for left total knee Monday, August 8th at 12:30pm  Pre-admission visit today with UA results available.   See Below:     Trace leuckocytes  Rare bacteria  Protein greater than 300 Hazy in appearance

## 2020-10-22 NOTE — Progress Notes (Signed)
Surgeon's scheduler, Debbie, contacted about pt's UA results (trace leukocytes, rare bacteria, and >300 protein in urine, hazy in appearance). Jackelyn Poling will report results to Dr. Erlinda Hong.

## 2020-10-22 NOTE — Progress Notes (Signed)
Surgical Instructions    Your procedure is scheduled on 10/27/20.  Report to [facility] Main Entrance "A" at 10:30 A.M., then check in with the Admitting office.  Call this number if you have problems the morning of surgery:  [phone]   If you have any questions prior to your surgery date call [phone]: Open Monday-Friday 8am-4pm    Remember:  Do not eat after midnight the night before your surgery  You may drink clear liquids until 09:30am the morning of your surgery.   Clear liquids allowed are: Water, Non-Citrus Juices (without pulp), Carbonated Beverages, Clear Tea, Black Coffee Only, and Gatorade  Patient Instructions  The night before surgery:  No food after midnight. ONLY clear liquids after midnight   The day of surgery (if you have diabetes): Drink ONE (1) 12 oz G2 given to you in your pre admission testing appointment by 09:30am the morning of surgery. Drink in one sitting. Do not sip.  This drink was given to you during your hospital  pre-op appointment visit.  Nothing else to drink after completing the  12 oz bottle of G2.         If you have questions, please contact your surgeon's office.     Take these medicines the morning of surgery with A SIP OF WATER  acetaminophen-codeine (TYLENOL #3) if needed amLODipine (NORVASC) HYDROcodone bit-homatropine (HYDROMET) if needed pantoprazole (PROTONIX) if taken on the surgery day traMADol (ULTRAM) if needed   As of today, STOP taking any Aspirin (unless otherwise instructed by your surgeon) Aleve, Naproxen, Ibuprofen, Motrin, Advil, Goody's, BC's, all herbal medications, fish oil, and all vitamins.  WHAT DO I DO ABOUT MY DIABETES MEDICATION?     Semaglutide, 1 MG/DOSE, (OZEMPIC, 1 MG/DOSE,) Do not take the morning of surgery insulin glargine, 1 Unit Dial, (TOUJEO SOLOSTAR) 300 UNIT/ML Solostar Pen The night before surgery, take 50% of dose insulin lispro (HUMALOG KWIKPEN) 100 UNIT/ML KwikPen Do not take  bedtime dose/night before surgery The morning of surgery, if your CBG is greater than 220 mg/dL, you may take  of your insulin lispro (HUMALOG KWIKPEN) .  HOW TO MANAGE YOUR DIABETES BEFORE AND AFTER SURGERY  Why is it important to control my blood sugar before and after surgery? Improving blood sugar levels before and after surgery helps healing and can limit problems. A way of improving blood sugar control is eating a healthy diet by:  Eating less sugar and carbohydrates  Increasing activity/exercise  Talking with your doctor about reaching your blood sugar goals High blood sugars (greater than 180 mg/dL) can raise your risk of infections and slow your recovery, so you will need to focus on controlling your diabetes during the weeks before surgery. Make sure that the doctor who takes care of your diabetes knows about your planned surgery including the date and location.  How do I manage my blood sugar before surgery? Check your blood sugar at least 4 times a day, starting 2 days before surgery, to make sure that the level is not too high or low.  Check your blood sugar the morning of your surgery when you wake up and every 2 hours until you get to the Short Stay unit.  If your blood sugar is less than 70 mg/dL, you will need to treat for low blood sugar: Do not take insulin. Treat a low blood sugar (less than 70 mg/dL) with  cup of clear juice (cranberry or apple), 4 glucose tablets, OR glucose gel. Recheck blood sugar  in 15 minutes after treatment (to make sure it is greater than 70 mg/dL). If your blood sugar is not greater than 70 mg/dL on recheck, call [phone] for further instructions. Report your blood sugar to the short stay nurse when you get to Short Stay.  If you are admitted to the hospital after surgery: Your blood sugar will be checked by the staff and you will probably be given insulin after surgery (instead of oral diabetes medicines) to make sure you have good blood  sugar levels. The goal for blood sugar control after surgery is 80-180 mg/dL.           Do not wear jewelry or makeup Do not wear lotions, powders, perfumes/colognes, or deodorant. Do not shave 48 hours prior to surgery.   Do not bring valuables to the hospital.  DO Not wear nail polish, gel polish, artificial nails, or any other type of covering on natural nails  including finger and toenails. If patients have artificial nails, gel coating, etc. that need to be removed by a nail salon please have this removed prior to surgery or surgery may need to be canceled/delayed if the surgeon/ anesthesia feels like the patient is unable to be adequately monitored.             [facility] is not responsible for any belongings or valuables.  Do NOT Smoke (Tobacco/Vaping) or drink Alcohol 24 hours prior to your procedure If you use a CPAP at night, you may bring all equipment for your overnight stay.   Contacts, glasses, dentures or bridgework may not be worn into surgery, please bring cases for these belongings   For patients admitted to the hospital, discharge time will be determined by your treatment team.   Patients discharged the day of surgery will not be allowed to drive home, and someone needs to stay with them for 24 hours.  ONLY 1 SUPPORT PERSON MAY BE PRESENT WHILE YOU ARE IN SURGERY. IF YOU ARE TO BE ADMITTED ONCE YOU ARE IN YOUR ROOM YOU WILL BE ALLOWED TWO (2) VISITORS.  Minor children may have two parents present. Special consideration for safety and communication needs will be reviewed on a case by case basis.  Special instructions:    Oral Hygiene is also important to reduce your risk of infection.  Remember - BRUSH YOUR TEETH THE MORNING OF SURGERY WITH YOUR REGULAR TOOTHPASTE   [facility]- Preparing For Surgery  Before surgery, you can play an important role. Because skin is not sterile, your skin needs to be as free of germs as possible. You can reduce the number of germs on  your skin by washing with CHG (chlorahexidine gluconate) Soap before surgery.  CHG is an antiseptic cleaner which kills germs and bonds with the skin to continue killing germs even after washing.     Please do not use if you have an allergy to CHG or antibacterial soaps. If your skin becomes reddened/irritated stop using the CHG.  Do not shave (including legs and underarms) for at least 48 hours prior to first CHG shower. It is OK to shave your face.  Please follow these instructions carefully.     Shower the NIGHT BEFORE SURGERY and the MORNING OF SURGERY with CHG Soap.   If you chose to wash your hair, wash your hair first as usual with your normal shampoo. After you shampoo, rinse your hair and body thoroughly to remove the shampoo.  Then [employer] and genitals (private parts) with your  normal soap and rinse thoroughly to remove soap.  After that Use CHG Soap as you would any other liquid soap. You can apply CHG directly to the skin and wash gently with a scrungie or a clean washcloth.   Apply the CHG Soap to your body ONLY FROM THE NECK DOWN.  Do not use on open wounds or open sores. Avoid contact with your eyes, ears, mouth and genitals (private parts). Wash Face and genitals (private parts)  with your normal soap.   Wash thoroughly, paying special attention to the area where your surgery will be performed.  Thoroughly rinse your body with warm water from the neck down.  DO NOT shower/wash with your normal soap after using and rinsing off the CHG Soap.  Pat yourself dry with a CLEAN TOWEL.  Wear CLEAN PAJAMAS to bed the night before surgery  Place CLEAN SHEETS on your bed the night before your surgery  DO NOT SLEEP WITH PETS.   Day of Surgery: Take a shower with CHG soap. Wear Clean/Comfortable clothing the morning of surgery Do not apply any deodorants/lotions.   Remember to brush your teeth WITH YOUR REGULAR TOOTHPASTE.   Please read over the following fact sheets that you  were given.

## 2020-10-22 NOTE — Progress Notes (Signed)
PCP - [name] Cardiologist - pt denies  PPM/ICD - n/a  Chest x-ray - 10/22/20 in PAT EKG - 10/22/20 in PAT Stress Test - 11/16/18 ECHO - 09/12/2007 Cardiac Cath - pt denies  Sleep Study - pt denies  Fasting Blood Sugar - 80-90 Checks Blood Sugar 3 times a day  Blood Thinner Instructions: n/a Aspirin Instructions: pt currently not taking, med list says patient will start after surgery  ERAS Protcol - yes PRE-SURGERY Ensure or G2- G2  COVID TEST- pt will get covid test on 10/24/20 at drive thru site; directions given to pt, along with requisition form  Anesthesia review: yes, cardiac hx   Patient denies shortness of breath, fever, cough and chest pain at PAT appointment   All instructions explained to the patient, with a verbal understanding of the material. Patient agrees to go over the instructions while at home for a better understanding. Patient also instructed to self quarantine after being tested for COVID-19. The opportunity to ask questions was provided.

## 2020-10-23 ENCOUNTER — Telehealth: Payer: Self-pay | Admitting: *Deleted

## 2020-10-23 ENCOUNTER — Other Ambulatory Visit: Payer: Self-pay | Admitting: Physician Assistant

## 2020-10-23 LAB — VITAMIN D 25 HYDROXY (VIT D DEFICIENCY, FRACTURES): Vit D, 25-Hydroxy: 95.2 ng/mL (ref 30.0–100.0)

## 2020-10-23 MED ORDER — CIPROFLOXACIN HCL 500 MG PO TABS: 500.0000 mg | ORAL_TABLET | Freq: Two times a day (BID) | ORAL | 0 refills | Status: DC

## 2020-10-23 NOTE — Care Plan (Signed)
RNCM call to patient to discuss her upcoming Left total knee replacement on 10/27/20. She is an Ortho bundle through [facility] and is agreeable to case management. She lives with her husband, who will be assisting her at home after short hospital stay. She will need a CPM, FWW, 3in1/BSC for after surgery. Referral made to [facility], who should be delivering to her home prior to surgery. She will need HHPT and a referral was made to [facility] after choice provided. Reviewed all post op care instructions. Post op appointment already scheduled with Dr. Erlinda Hong on 11/11/20. Will assist with setting up her OPPT when appropriate. Will continue to follow for needs.

## 2020-10-23 NOTE — Progress Notes (Addendum)
Anesthesia Chart Review:  Case: [id number] Date/Time: 10/27/20 1219   Procedure: LEFT TOTAL KNEE ARTHROPLASTY (Left: Knee)   Anesthesia type: Spinal   Pre-op diagnosis: left knee degenerative joint disease   Location: [facility] OR ROOM 04 / [facility]   Surgeons: [name], MD       DISCUSSION: Patient is a 65 year old female scheduled for the above procedure.  History includes former smoker (quit 09/10/74), HTN, HLD, DM2, gastroparesis, RA, GERD, non-alcoholic fatty liver disease. BMI is consistent with obesity. Normal coronaries in 2009, non-ischemic stress test in 2020.   PCP Dr. Baird Cancer signed a note medical clearance today, noting A1c 6.8% on 08/25/20 and to consult Hospitalist for any perioperative concerns.   She is scheduled for presurgical COVID-19 test on 10/24/2020.  Anesthesia team to evaluate on the day of surgery.   VS: BP (!) 165/62   Pulse (!) 56   Temp 36.7 C (Oral)   Resp 18   Ht _0  (1.626 m)   Wt 89.3 kg   SpO2 98%   BMI 33.80 kg/m   PROVIDERS: [name], MD is PCP - She is not routinely followed by cardiology, but was evaluated by Barbaraann Boys, MD on 10/24/18 ([place]) for chest pain and dyspnea, ruled out MI in ED. Chest pain felt atypical, possibly related to decompensated body habitus and poorly controlled HTN. Stress test ordered which was low rist/non-ischemic. As needed cardiology follow-up recommended.    LABS: Labs reviewed: Acceptable for surgery.  Cr 1.30, up from 1.00 on 07/23/20 with range of 1.00-1.52 since July 2021. AST/ALT mildly elevated at 51/45. PLT count normal. Known fatty liver disease. Ortho prescribed Cipro given UA results and DM history.  (all labs ordered are listed, but only abnormal results are displayed)  Labs Reviewed  HEMOGLOBIN A1C - Abnormal; Notable for the following components:      Result Value   Hgb A1c MFr Bld 6.3 (*)    All other components within normal limits  COMPREHENSIVE METABOLIC PANEL - Abnormal; Notable for the  following components:   Creatinine, Ser 1.30 (*)    AST 51 (*)    ALT 45 (*)    Alkaline Phosphatase 131 (*)    GFR, Estimated 45 (*)    All other components within normal limits  URINALYSIS, ROUTINE W REFLEX MICROSCOPIC - Abnormal; Notable for the following components:   Color, Urine AMBER (*)    APPearance HAZY (*)    Protein, ur >=300 (*)    Leukocytes,Ua TRACE (*)    Bacteria, UA RARE (*)    All other components within normal limits  SURGICAL PCR SCREEN  GLUCOSE, CAPILLARY  CBC WITH DIFFERENTIAL/PLATELET  PROTIME-INR  APTT  TYPE AND SCREEN     IMAGES: CXR 10/22/20: FINDINGS: The heart size and mediastinal contours are within normal limits. Both lungs are clear. No pleural effusion or pneumothorax. Stable radiopacity noted over the right shoulder most likely loose body, similar finding noted on prior right shoulder series of 05/07/2019. No acute bony abnormality. IMPRESSION: No active cardiopulmonary disease.   EKG: 10/22/20: Sinus bradycardia at 50 bpm Nonspecific T wave abnormality Abnormal ECG No significant change since last tracing Confirmed by Camnitz, Will ([id number]) on 10/23/2020 9:18:23 AM   CV: Nuclear stress test 11/03/18 ([facility]): IMPRESSION:  1. No reversible ischemia or infarction.  2. Normal left ventricular wall motion.  3. Left ventricular ejection fraction 84%  4. Non invasive risk stratification: Low     Cardiac cath 09/14/07 Rex Kras, Delrae Alfred,  MD): CONCLUSION: 1. No evidence of coronary disease. 2. Normal left ventricular systolic function. 3. No evidence of renal artery stenosis or abdominal aortic aneurysm. 4. Severe hypertension. - At this point, I can only assume that her elevation of her pressure may be playing some role in her chest pain.  She clearly does not have cardiac anatomy to explain the chest pain syndrome.   Echo 09/12/07: SUMMARY  -  Overall left ventricular systolic function was normal. Left ventricular ejection fraction  was estimated , range being 55 % to 65 %. There was no diagnostic evidence of left ventricular regional wall motion abnormalities. Left ventricular wall thickness was moderately increased. There was mild focal basal septal hypertrophy. There was no evidence for left ventricular dynamic outflow obstruction.  Left ventricular diastolic function parameters were normal.  -  The left atrium was mildly dilated.    Past Medical History:  Diagnosis Date   DM (diabetes mellitus) ([place])    Fatty liver disease, nonalcoholic    Gastritis    Gastroparesis    GERD (gastroesophageal reflux disease)    Heartburn    Hiatal hernia    HLD (hyperlipidemia)    HTN (hypertension)    Joint pain    Renal calculus    Rheumatoid arthritis ([place])    Vitamin D deficiency     Past Surgical History:  Procedure Laterality Date   CHOLECYSTECTOMY     COLONOSCOPY     TUBAL LIGATION     UPPER GASTROINTESTINAL ENDOSCOPY      MEDICATIONS:  acetaminophen-codeine (TYLENOL #3) 300-30 MG tablet   amLODipine (NORVASC) 10 MG tablet   aspirin EC 81 MG tablet   Azilsartan Medoxomil (EDARBI) 80 MG TABS   benzonatate (TESSALON PERLES) 100 MG capsule   Blood Glucose Monitoring Suppl (ONETOUCH VERIO) w/Device KIT   cetirizine (ZYRTEC) 10 MG tablet   Cholecalciferol (VITAMIN D3) 1.25 MG (50000 UT) CAPS   ciprofloxacin (CIPRO) 500 MG tablet   docusate sodium (COLACE) 100 MG capsule   ferrous gluconate (FERGON) 324 MG tablet   glucose blood (ONETOUCH VERIO) test strip   HYDROcodone bit-homatropine (HYDROMET) 5-1.5 MG/5ML syrup   ibuprofen (ADVIL) 200 MG tablet   insulin glargine, 1 Unit Dial, (TOUJEO SOLOSTAR) 300 UNIT/ML Solostar Pen   insulin lispro (HUMALOG KWIKPEN) 100 UNIT/ML KwikPen   Insulin Pen Needle 32G X 6 MM MISC   linaclotide ([name]) 145 MCG CAPS capsule   Magnesium 400 MG TABS   methocarbamol (ROBAXIN) 500 MG tablet   ondansetron (ZOFRAN) 4 MG tablet   OneTouch Delica Lancets 01S MISC    oxyCODONE-acetaminophen (PERCOCET) 5-325 MG tablet   pantoprazole (PROTONIX) 40 MG tablet   rosuvastatin (CRESTOR) 20 MG tablet   Semaglutide, 1 MG/DOSE, (OZEMPIC, 1 MG/DOSE,) 4 MG/3ML SOPN   sulfamethoxazole-trimethoprim (BACTRIM DS) 800-160 MG tablet   temazepam (RESTORIL) 30 MG capsule   No current facility-administered medications for this encounter.  She is not currently taking ASA.   Myra Gianotti, PA-C Surgical Short Stay/Anesthesiology [facility] Phone [phone] [facility] Phone [phone] 10/23/2020 10:49 AM

## 2020-10-23 NOTE — Telephone Encounter (Signed)
Ortho bundle pre-op call completed. 

## 2020-10-23 NOTE — Anesthesia Preprocedure Evaluation (Addendum)
Anesthesia Evaluation  Patient identified by MRN, date of birth, ID band Patient awake    Reviewed: Allergy & Precautions, NPO status , Patient's Chart, lab work & pertinent test results  Airway Mallampati: II  TM Distance: >3 FB Neck ROM: Full    Dental no notable dental hx.    Pulmonary former smoker,    Pulmonary exam normal breath sounds clear to auscultation       Cardiovascular Exercise Tolerance: Poor hypertension (did not take amlodipine this morn), Normal cardiovascular exam Rhythm:Regular Rate:Normal  10/22/20 EKG Sinus bradycardia Nonspecific T wave abnormality Abnormal ECG   Neuro/Psych negative psych ROS   GI/Hepatic PUD, GERD  ,(+) Hepatitis - (fatty liver)  Endo/Other  diabetes, Type 2, Insulin DependentObesity BMI 33  Renal/GU Renal InsufficiencyRenal disease  negative genitourinary   Musculoskeletal  (+) Arthritis , Rheumatoid disorders,    Abdominal   Peds negative pediatric ROS (+)  Hematology negative hematology ROS (+)   Anesthesia Other Findings   Reproductive/Obstetrics                           Anesthesia Physical Anesthesia Plan  ASA: 3  Anesthesia Plan: Spinal and Regional   Post-op Pain Management:  Regional for Post-op pain   Induction: Intravenous  PONV Risk Score and Plan: 2 and Treatment may vary due to age or medical condition, TIVA and Propofol infusion  Airway Management Planned: Natural Airway and Simple Face Mask  Additional Equipment:   Intra-op Plan:   Post-operative Plan:   Informed Consent:   Plan Discussed with:   Anesthesia Plan Comments: (Adductor canal block for postop pain control. Spinal. GA/LMA as backup. Norton Blizzard, MD     PAT note written 10/23/2020 by Myra Gianotti, PA-C. )       Anesthesia Quick Evaluation

## 2020-10-23 NOTE — Progress Notes (Signed)
Chief Complaint:   OBESITY Leslie Davis is here to discuss her progress with her obesity treatment plan along with follow-up of her obesity related diagnoses. Leslie Davis is on the Category 1 Plan and states she is following her eating plan approximately 95% of the time. Leslie Davis states she was doing lots of walking on the beach.  Today's visit was #: 7 Starting weight: 208 lbs Starting date: 07/23/2020 Today's weight: 193 lbs Today's date: 10/22/2020 Total lbs lost to date: 15 Total lbs lost since last in-office visit: 0  Interim History: Leslie Davis just came back from a beach vacation. She is glad that it was only 1 pound weight gain as she was using portion control and smart choices. Leslie Davis is having knee surgery this Monday and will be out for 6 weeks.  Subjective:   1. Acute on chronic constipation, unspecified constipation type Leslie Davis notes that she usually has 1 bowel movement each day, but has had no stooling in 3 days. She took Ex Lax OTC with no help. Miralax was no help and she is unable to take Mag Citrate due to increased sugars.  2. Hypertension associated with type 2 diabetes mellitus (Leslie Davis) Leslie Davis's blood pressure at home is running in the 150's-140's/70's-80's. Her blood pressure is usually running lower than her blood pressure is today.  BP Readings from Last 3 Encounters:  10/22/20 (!) 165/62  10/22/20 (!) 156/84  10/09/20 (!) 154/80   Lab Results  Component Value Date   CREATININE 1.30 (H) 10/22/2020   CREATININE 1.00 07/23/2020   CREATININE 1.15 (H) 02/21/2020   3. Vitamin D deficiency She is currently taking prescription vitamin D 50,000 IU each week. She denies nausea, vomiting or muscle weakness.  Lab Results  Component Value Date   VD25OH 95.2 10/22/2020   VD25OH 112.0 (H) 07/23/2020   VD25OH 24.3 (L) 11/15/2019    Assessment/Plan:   1. Acute on chronic constipation, unspecified constipation type Leslie Davis agrees to increase her water intake as she  hasn't been taking much in and to start [place]. She was informed that a decrease in bowel movement frequency is normal while losing weight, but stools should not be hard or painful. Orders and follow up as documented in patient record.   Counseling Getting to Good Bowel Health: Your goal is to have one soft bowel movement each day. Drink at least 8 glasses of water each day. Eat plenty of fiber (goal is over 25 grams each day). It is best to get most of your fiber from dietary sources which includes leafy green vegetables, fresh fruit, and whole grains. You may need to add fiber with the help of OTC fiber supplements. These include Metamucil, Citrucel, and Flaxseed. If you are still having trouble, try adding Miralax or Magnesium Citrate. If all of these changes do not work, Cabin crew.   - linaclotide ([name]) 145 MCG CAPS capsule; Take 1 capsule (145 mcg total) by mouth daily.  Dispense: 30 capsule; Refill: 0  2. Hypertension associated with type 2 diabetes mellitus (Leslie Davis) Leslie Davis agrees to decrease her salt intake, increase her water intake, continue her PNP, and to lose weight. She is working on healthy weight loss and exercise to improve blood pressure control. We will watch for signs of hypotension as she continues her lifestyle modifications.  3. Vitamin D deficiency Low Vitamin D level contributes to fatigue and are associated with obesity, breast, and colon cancer. She agrees to continue to take prescription Vitamin D '@50'$ ,000 IU every week and  will follow-up for routine testing of Vitamin D, at least 2-3 times per year to avoid over-replacement. We will order labs today and Cambreigh agrees to follow up as directed.  - VITAMIN D 25 Hydroxy (Vit-D Deficiency, Fractures)  4. Obesity with current BMI of 33.2 Leslie Davis is currently in the action stage of change. As such, her goal is to continue with weight loss efforts. She has agreed to the Category 1 Plan.   Exercise goals: For  substantial health benefits, adults should do at least 150 minutes (2 hours and 30 minutes) a week of moderate-intensity, or 75 minutes (1 hour and 15 minutes) a week of vigorous-intensity aerobic physical activity, or an equivalent combination of moderate- and vigorous-intensity aerobic activity. Aerobic activity should be performed in episodes of at least 10 minutes, and preferably, it should be spread throughout the week.  Behavioral modification strategies: increasing lean protein intake and decreasing simple carbohydrates.  Leslie Davis has agreed to follow-up with our clinic in 3 weeks. She was informed of the importance of frequent follow-up visits to maximize her success with intensive lifestyle modifications for her multiple health conditions.   Leslie Davis was informed we would discuss her lab results at her next visit unless there is a critical issue that needs to be addressed sooner. Leslie Davis agreed to keep her next visit at the agreed upon time to discuss these results.  Objective:   Blood pressure (!) 156/84, pulse (!) 53, temperature 97.9 F (36.6 C), height '5\' 4"'$  (1.626 m), weight 193 lb (87.5 kg), SpO2 100 %. Body mass index is 33.13 kg/m.  General: Cooperative, alert, well developed, in no acute distress. HEENT: Conjunctivae and lids unremarkable. Cardiovascular: Regular rhythm.  Lungs: Normal work of breathing. Neurologic: No focal deficits.   Lab Results  Component Value Date   CREATININE 1.30 (H) 10/22/2020   BUN 20 10/22/2020   NA 138 10/22/2020   K 3.9 10/22/2020   CL 106 10/22/2020   CO2 23 10/22/2020   Lab Results  Component Value Date   ALT 45 (H) 10/22/2020   AST 51 (H) 10/22/2020   ALKPHOS 131 (H) 10/22/2020   BILITOT 0.4 10/22/2020   Lab Results  Component Value Date   HGBA1C 6.3 (H) 10/22/2020   HGBA1C 6.8 (H) 08/25/2020   HGBA1C 8.7 (H) 05/28/2020   HGBA1C 8.8 (H) 02/21/2020   HGBA1C 10.5 (H) 11/15/2019   No results found for: INSULIN Lab Results   Component Value Date   TSH 1.790 07/23/2020   Lab Results  Component Value Date   CHOL 179 07/23/2020   HDL 88 07/23/2020   LDLCALC 74 07/23/2020   TRIG 99 07/23/2020   CHOLHDL 2.0 07/23/2020   Lab Results  Component Value Date   VD25OH 95.2 10/22/2020   VD25OH 112.0 (H) 07/23/2020   VD25OH 24.3 (L) 11/15/2019   Lab Results  Component Value Date   WBC 5.6 10/22/2020   HGB 12.3 10/22/2020   HCT 38.3 10/22/2020   MCV 88.0 10/22/2020   PLT 294 10/22/2020   Lab Results  Component Value Date   IRON 68 11/16/2017   TIBC 355 11/16/2017   FERRITIN 45.2 11/16/2017    Obesity Behavioral Intervention:   Approximately 15 minutes were spent on the discussion below.  ASK: We discussed the diagnosis of obesity with [name] today and Elynore agreed to give [place] permission to discuss obesity behavioral modification therapy today.  ASSESS: Elner has the diagnosis of obesity and her BMI today is 33.2. Brittony is in the  action stage of change.   ADVISE: Mora was educated on the multiple health risks of obesity as well as the benefit of weight loss to improve her health. She was advised of the need for long term treatment and the importance of lifestyle modifications to improve her current health and to decrease her risk of future health problems.  AGREE: Multiple dietary modification options and treatment options were discussed and Alessa agreed to follow the recommendations documented in the above note.  ARRANGE: Analleli was educated on the importance of frequent visits to treat obesity as outlined per CMS and USPSTF guidelines and agreed to schedule her next follow up appointment today.  Attestation Statements:   Reviewed by clinician on day of visit: allergies, medications, problem list, medical history, surgical history, family history, social history, and previous encounter notes.  IMarcille Blanco, CMA, am acting as transcriptionist for [employer], DO  I have  reviewed the above documentation for accuracy and completeness, and I agree with the above. Marjory Sneddon, D.O.  The [place] was signed into law in 2016 which includes the topic of electronic health records.  This provides immediate access to information in MyChart.  This includes consultation notes, operative notes, office notes, lab results and pathology reports.  If you have any questions about what you read please let us know at your next visit so we can discuss your concerns and take corrective action if need be.  We are right here with you.

## 2020-10-23 NOTE — Telephone Encounter (Signed)
Sherri T called patient.

## 2020-10-23 NOTE — Telephone Encounter (Signed)
Sent in.  Kathlee Nations, can you let patient know that I have called in antibiotics to start taking asap.  thanks

## 2020-10-24 ENCOUNTER — Other Ambulatory Visit: Payer: Self-pay

## 2020-10-24 MED ORDER — TRANEXAMIC ACID 1000 MG/10ML IV SOLN
2000.0000 mg | INTRAVENOUS | Status: DC
  Filled 2020-10-24: qty 20

## 2020-10-27 ENCOUNTER — Observation Stay (HOSPITAL_COMMUNITY): Payer: Medicare Other

## 2020-10-27 ENCOUNTER — Encounter (HOSPITAL_COMMUNITY)
Admission: RE | Disposition: A | Discharge status: Home / Self Care | Payer: Self-pay | Source: Home / Self Care | Attending: Orthopaedic Surgery

## 2020-10-27 ENCOUNTER — Ambulatory Visit (HOSPITAL_COMMUNITY): Payer: Medicare Other | Admitting: Vascular Surgery

## 2020-10-27 ENCOUNTER — Observation Stay (HOSPITAL_COMMUNITY)
Admission: RE | Admit: 2020-10-27 | Discharge: 2020-10-28 | Disposition: A | Discharge status: Home / Self Care | Payer: Medicare Other | Attending: Orthopaedic Surgery | Admitting: Orthopaedic Surgery

## 2020-10-27 ENCOUNTER — Ambulatory Visit (HOSPITAL_COMMUNITY): Payer: Medicare Other | Admitting: Anesthesiology

## 2020-10-27 ENCOUNTER — Other Ambulatory Visit: Payer: Self-pay

## 2020-10-27 ENCOUNTER — Encounter (HOSPITAL_COMMUNITY): Payer: Self-pay | Admitting: Orthopaedic Surgery

## 2020-10-27 DIAGNOSIS — Z79899 Other long term (current) drug therapy: Secondary | ICD-10-CM | POA: Diagnosis not present

## 2020-10-27 DIAGNOSIS — Z87891 Personal history of nicotine dependence: Secondary | ICD-10-CM | POA: Diagnosis not present

## 2020-10-27 DIAGNOSIS — E119 Type 2 diabetes mellitus without complications: Secondary | ICD-10-CM | POA: Diagnosis not present

## 2020-10-27 DIAGNOSIS — Z794 Long term (current) use of insulin: Secondary | ICD-10-CM | POA: Insufficient documentation

## 2020-10-27 DIAGNOSIS — M1712 Unilateral primary osteoarthritis, left knee: Principal | ICD-10-CM

## 2020-10-27 DIAGNOSIS — K760 Fatty (change of) liver, not elsewhere classified: Secondary | ICD-10-CM | POA: Diagnosis not present

## 2020-10-27 DIAGNOSIS — Z96652 Presence of left artificial knee joint: Secondary | ICD-10-CM

## 2020-10-27 DIAGNOSIS — E785 Hyperlipidemia, unspecified: Secondary | ICD-10-CM | POA: Diagnosis not present

## 2020-10-27 DIAGNOSIS — R52 Pain, unspecified: Secondary | ICD-10-CM

## 2020-10-27 DIAGNOSIS — Z7982 Long term (current) use of aspirin: Secondary | ICD-10-CM | POA: Insufficient documentation

## 2020-10-27 DIAGNOSIS — I10 Essential (primary) hypertension: Secondary | ICD-10-CM | POA: Insufficient documentation

## 2020-10-27 DIAGNOSIS — G8918 Other acute postprocedural pain: Secondary | ICD-10-CM | POA: Diagnosis not present

## 2020-10-27 HISTORY — PX: TOTAL KNEE ARTHROPLASTY: SHX125

## 2020-10-27 LAB — GLUCOSE, CAPILLARY
Glucose-Capillary: 104 mg/dL — ABNORMAL HIGH (ref 70–99)
Glucose-Capillary: 105 mg/dL — ABNORMAL HIGH (ref 70–99)
Glucose-Capillary: 120 mg/dL — ABNORMAL HIGH (ref 70–99)
Glucose-Capillary: 209 mg/dL — ABNORMAL HIGH (ref 70–99)

## 2020-10-27 LAB — ABO/RH: ABO/RH(D): A POS

## 2020-10-27 LAB — SARS CORONAVIRUS 2 (TAT 6-24 HRS): SARS Coronavirus 2: NEGATIVE

## 2020-10-27 SURGERY — ARTHROPLASTY, KNEE, TOTAL
Anesthesia: Regional | Site: Knee | Laterality: Left

## 2020-10-27 MED ORDER — ONDANSETRON HCL 4 MG/2ML IJ SOLN
4.0000 mg | Freq: Four times a day (QID) | INTRAMUSCULAR | Status: DC | PRN
  Administered 2020-10-27: 4 mg via INTRAVENOUS
  Filled 2020-10-27: qty 2

## 2020-10-27 MED ORDER — ROPIVACAINE HCL 5 MG/ML IJ SOLN
INTRAMUSCULAR | Status: DC | PRN
  Administered 2020-10-27: 20 mL

## 2020-10-27 MED ORDER — INSULIN ASPART 100 UNIT/ML IJ SOLN
0.0000 [IU] | Freq: Every day | INTRAMUSCULAR | Status: DC
  Administered 2020-10-27: 2 [IU] via SUBCUTANEOUS

## 2020-10-27 MED ORDER — 0.9 % SODIUM CHLORIDE (POUR BTL) OPTIME
TOPICAL | Status: DC | PRN
  Administered 2020-10-27: 1000 mL

## 2020-10-27 MED ORDER — OXYCODONE HCL 5 MG PO TABS
10.0000 mg | ORAL_TABLET | ORAL | Status: DC | PRN
  Administered 2020-10-27: 10 mg via ORAL
  Administered 2020-10-28: 15 mg via ORAL
  Filled 2020-10-27: qty 2
  Filled 2020-10-27: qty 3

## 2020-10-27 MED ORDER — ONDANSETRON HCL 4 MG PO TABS
4.0000 mg | ORAL_TABLET | Freq: Four times a day (QID) | ORAL | Status: DC | PRN
  Administered 2020-10-28: 4 mg via ORAL
  Filled 2020-10-27: qty 1

## 2020-10-27 MED ORDER — BUPIVACAINE-MELOXICAM ER 400-12 MG/14ML IJ SOLN
INTRAMUSCULAR | Status: AC
  Filled 2020-10-27: qty 1

## 2020-10-27 MED ORDER — AMISULPRIDE (ANTIEMETIC) 5 MG/2ML IV SOLN: 10.0000 mg | Freq: Once | INTRAVENOUS | Status: DC | PRN

## 2020-10-27 MED ORDER — OXYCODONE HCL 5 MG PO TABS
5.0000 mg | ORAL_TABLET | ORAL | Status: DC | PRN
  Administered 2020-10-27 – 2020-10-28 (×3): 10 mg via ORAL
  Filled 2020-10-27 (×3): qty 2

## 2020-10-27 MED ORDER — MIDAZOLAM HCL 2 MG/2ML IJ SOLN
INTRAMUSCULAR | Status: AC
  Filled 2020-10-27: qty 2

## 2020-10-27 MED ORDER — METOCLOPRAMIDE HCL 5 MG/ML IJ SOLN: 5.0000 mg | Freq: Three times a day (TID) | INTRAMUSCULAR | Status: DC | PRN

## 2020-10-27 MED ORDER — CEFAZOLIN SODIUM-DEXTROSE 2-4 GM/100ML-% IV SOLN
2.0000 g | Freq: Four times a day (QID) | INTRAVENOUS | Status: AC
Start: 2020-10-27 — End: 2020-10-27
  Administered 2020-10-27 (×2): 2 g via INTRAVENOUS
  Filled 2020-10-27 (×2): qty 100

## 2020-10-27 MED ORDER — SODIUM CHLORIDE 0.9 % IV SOLN: INTRAVENOUS | Status: DC

## 2020-10-27 MED ORDER — VANCOMYCIN HCL 1000 MG IV SOLR
INTRAVENOUS | Status: AC
  Filled 2020-10-27: qty 1000

## 2020-10-27 MED ORDER — IRBESARTAN 150 MG PO TABS
300.0000 mg | ORAL_TABLET | Freq: Every day | ORAL | Status: DC
  Administered 2020-10-27 – 2020-10-28 (×2): 300 mg via ORAL
  Filled 2020-10-27 (×2): qty 2

## 2020-10-27 MED ORDER — LACTATED RINGERS IV SOLN: INTRAVENOUS | Status: DC

## 2020-10-27 MED ORDER — FENTANYL CITRATE (PF) 100 MCG/2ML IJ SOLN
50.0000 ug | Freq: Once | INTRAMUSCULAR | Status: AC
  Filled 2020-10-27: qty 1

## 2020-10-27 MED ORDER — FENTANYL CITRATE (PF) 100 MCG/2ML IJ SOLN: 25.0000 ug | INTRAMUSCULAR | Status: DC | PRN

## 2020-10-27 MED ORDER — ORAL CARE MOUTH RINSE: 15.0000 mL | Freq: Once | OROMUCOSAL | Status: AC

## 2020-10-27 MED ORDER — DOCUSATE SODIUM 100 MG PO CAPS
100.0000 mg | ORAL_CAPSULE | Freq: Two times a day (BID) | ORAL | Status: DC
  Administered 2020-10-27 – 2020-10-28 (×3): 100 mg via ORAL
  Filled 2020-10-27 (×3): qty 1

## 2020-10-27 MED ORDER — POVIDONE-IODINE 10 % EX SWAB
2.0000 "application " | Freq: Once | CUTANEOUS | Status: AC
  Administered 2020-10-27: 2 via TOPICAL

## 2020-10-27 MED ORDER — PROPOFOL 10 MG/ML IV BOLUS
INTRAVENOUS | Status: AC
  Filled 2020-10-27: qty 20

## 2020-10-27 MED ORDER — ACETAMINOPHEN 325 MG PO TABS: 325.0000 mg | ORAL_TABLET | Freq: Four times a day (QID) | ORAL | Status: DC | PRN

## 2020-10-27 MED ORDER — PROPOFOL 10 MG/ML IV BOLUS
INTRAVENOUS | Status: DC | PRN
  Administered 2020-10-27: 20 mg via INTRAVENOUS

## 2020-10-27 MED ORDER — OXYCODONE HCL 5 MG PO TABS: 5.0000 mg | ORAL_TABLET | Freq: Once | ORAL | Status: AC | PRN

## 2020-10-27 MED ORDER — DEXAMETHASONE SODIUM PHOSPHATE 10 MG/ML IJ SOLN
10.0000 mg | Freq: Once | INTRAMUSCULAR | Status: AC
  Administered 2020-10-28: 10 mg via INTRAVENOUS
  Filled 2020-10-27: qty 1

## 2020-10-27 MED ORDER — HYDROMORPHONE HCL 1 MG/ML IJ SOLN
0.5000 mg | INTRAMUSCULAR | Status: DC | PRN
  Administered 2020-10-27: 1 mg via INTRAVENOUS
  Filled 2020-10-27: qty 1

## 2020-10-27 MED ORDER — FENTANYL CITRATE (PF) 250 MCG/5ML IJ SOLN
INTRAMUSCULAR | Status: AC
  Filled 2020-10-27: qty 5

## 2020-10-27 MED ORDER — VANCOMYCIN HCL 1000 MG IV SOLR
INTRAVENOUS | Status: DC | PRN
  Administered 2020-10-27: 1000 mg via TOPICAL

## 2020-10-27 MED ORDER — ASPIRIN 81 MG PO CHEW
81.0000 mg | CHEWABLE_TABLET | Freq: Two times a day (BID) | ORAL | Status: DC
  Administered 2020-10-27 – 2020-10-28 (×2): 81 mg via ORAL
  Filled 2020-10-27 (×2): qty 1

## 2020-10-27 MED ORDER — OXYCODONE HCL 5 MG/5ML PO SOLN
5.0000 mg | Freq: Once | ORAL | Status: AC | PRN
Start: 2020-10-27 — End: 2020-10-27

## 2020-10-27 MED ORDER — TRANEXAMIC ACID 1000 MG/10ML IV SOLN
INTRAVENOUS | Status: DC | PRN
  Administered 2020-10-27: 2000 mg via TOPICAL

## 2020-10-27 MED ORDER — BUPIVACAINE IN DEXTROSE 0.75-8.25 % IT SOLN
INTRATHECAL | Status: DC | PRN
  Administered 2020-10-27: 1.8 mL via INTRATHECAL

## 2020-10-27 MED ORDER — PHENYLEPHRINE HCL-NACL 20-0.9 MG/250ML-% IV SOLN
INTRAVENOUS | Status: DC | PRN
  Administered 2020-10-27: 30 ug/min via INTRAVENOUS

## 2020-10-27 MED ORDER — MENTHOL 3 MG MT LOZG: 1.0000 | LOZENGE | OROMUCOSAL | Status: DC | PRN

## 2020-10-27 MED ORDER — METHOCARBAMOL 1000 MG/10ML IJ SOLN
500.0000 mg | Freq: Four times a day (QID) | INTRAVENOUS | Status: DC | PRN
  Filled 2020-10-27: qty 5

## 2020-10-27 MED ORDER — INSULIN ASPART 100 UNIT/ML IJ SOLN
0.0000 [IU] | Freq: Three times a day (TID) | INTRAMUSCULAR | Status: DC
  Administered 2020-10-28: 7 [IU] via SUBCUTANEOUS

## 2020-10-27 MED ORDER — METHOCARBAMOL 500 MG PO TABS
ORAL_TABLET | ORAL | Status: AC
  Administered 2020-10-27: 500 mg via ORAL
  Filled 2020-10-27: qty 1

## 2020-10-27 MED ORDER — TRANEXAMIC ACID-NACL 1000-0.7 MG/100ML-% IV SOLN
1000.0000 mg | Freq: Once | INTRAVENOUS | Status: AC
  Administered 2020-10-27: 1000 mg via INTRAVENOUS
  Filled 2020-10-27: qty 100

## 2020-10-27 MED ORDER — PROPOFOL 500 MG/50ML IV EMUL
INTRAVENOUS | Status: DC | PRN
  Administered 2020-10-27: 75 ug/kg/min via INTRAVENOUS

## 2020-10-27 MED ORDER — EPHEDRINE SULFATE-NACL 50-0.9 MG/10ML-% IV SOSY
PREFILLED_SYRINGE | INTRAVENOUS | Status: DC | PRN
  Administered 2020-10-27: 5 mg via INTRAVENOUS

## 2020-10-27 MED ORDER — FENTANYL CITRATE (PF) 100 MCG/2ML IJ SOLN
INTRAMUSCULAR | Status: AC
  Administered 2020-10-27: 50 ug via INTRAVENOUS
  Filled 2020-10-27: qty 2

## 2020-10-27 MED ORDER — CHLORHEXIDINE GLUCONATE 0.12 % MT SOLN
15.0000 mL | Freq: Once | OROMUCOSAL | Status: AC
  Administered 2020-10-27: 15 mL via OROMUCOSAL
  Filled 2020-10-27: qty 15

## 2020-10-27 MED ORDER — METHOCARBAMOL 500 MG PO TABS
500.0000 mg | ORAL_TABLET | Freq: Four times a day (QID) | ORAL | Status: DC | PRN
  Administered 2020-10-27 – 2020-10-28 (×3): 500 mg via ORAL
  Filled 2020-10-27 (×3): qty 1

## 2020-10-27 MED ORDER — IRRISEPT - 450ML BOTTLE WITH 0.05% CHG IN STERILE WATER, USP 99.95% OPTIME
TOPICAL | Status: DC | PRN
  Administered 2020-10-27: 450 mL

## 2020-10-27 MED ORDER — ACETAMINOPHEN 500 MG PO TABS
1000.0000 mg | ORAL_TABLET | Freq: Four times a day (QID) | ORAL | Status: AC
  Administered 2020-10-27 – 2020-10-28 (×3): 1000 mg via ORAL
  Filled 2020-10-27 (×3): qty 2

## 2020-10-27 MED ORDER — OXYCODONE HCL 5 MG PO TABS
ORAL_TABLET | ORAL | Status: AC
  Administered 2020-10-27: 5 mg via ORAL
  Filled 2020-10-27: qty 1

## 2020-10-27 MED ORDER — PROMETHAZINE HCL 25 MG/ML IJ SOLN: 6.2500 mg | INTRAMUSCULAR | Status: DC | PRN

## 2020-10-27 MED ORDER — LIDOCAINE 2% (20 MG/ML) 5 ML SYRINGE
INTRAMUSCULAR | Status: DC | PRN
  Administered 2020-10-27: 40 mg via INTRAVENOUS

## 2020-10-27 MED ORDER — PHENYLEPHRINE HCL (PRESSORS) 10 MG/ML IV SOLN
INTRAVENOUS | Status: AC
  Filled 2020-10-27: qty 1

## 2020-10-27 MED ORDER — METOCLOPRAMIDE HCL 5 MG PO TABS: 5.0000 mg | ORAL_TABLET | Freq: Three times a day (TID) | ORAL | Status: DC | PRN

## 2020-10-27 MED ORDER — BUPIVACAINE-MELOXICAM ER 400-12 MG/14ML IJ SOLN
INTRAMUSCULAR | Status: DC | PRN
  Administered 2020-10-27: 400 mg

## 2020-10-27 MED ORDER — CEFAZOLIN SODIUM-DEXTROSE 2-4 GM/100ML-% IV SOLN
2.0000 g | INTRAVENOUS | Status: AC
  Administered 2020-10-27: 2 g via INTRAVENOUS
  Filled 2020-10-27: qty 100

## 2020-10-27 MED ORDER — SODIUM CHLORIDE 0.9 % IR SOLN
Status: DC | PRN
  Administered 2020-10-27: 1000 mL

## 2020-10-27 MED ORDER — TRANEXAMIC ACID-NACL 1000-0.7 MG/100ML-% IV SOLN
1000.0000 mg | INTRAVENOUS | Status: AC
  Administered 2020-10-27: 1000 mg via INTRAVENOUS
  Filled 2020-10-27: qty 100

## 2020-10-27 MED ORDER — ONDANSETRON HCL 4 MG/2ML IJ SOLN
INTRAMUSCULAR | Status: DC | PRN
  Administered 2020-10-27: 4 mg via INTRAVENOUS

## 2020-10-27 MED ORDER — AMLODIPINE BESYLATE 5 MG PO TABS
10.0000 mg | ORAL_TABLET | Freq: Every day | ORAL | Status: DC
  Administered 2020-10-27 – 2020-10-28 (×2): 10 mg via ORAL
  Filled 2020-10-27 (×2): qty 2

## 2020-10-27 MED ORDER — PHENOL 1.4 % MT LIQD: 1.0000 | OROMUCOSAL | Status: DC | PRN

## 2020-10-27 MED ORDER — INSULIN LISPRO (1 UNIT DIAL) 100 UNIT/ML (KWIKPEN): 4.0000 [IU] | PEN_INJECTOR | Freq: Every day | SUBCUTANEOUS | Status: DC | PRN

## 2020-10-27 SURGICAL SUPPLY — 84 items
2.5 mm Female Hex Screw (Screw) ×2 IMPLANT
ADH SKN CLS APL DERMABOND .7 (GAUZE/BANDAGES/DRESSINGS) ×1
ALCOHOL 70% 16 OZ (MISCELLANEOUS) ×2 IMPLANT
BAG COUNTER SPONGE SURGICOUNT (BAG) IMPLANT
BAG DECANTER FOR FLEXI CONT (MISCELLANEOUS) ×2 IMPLANT
BAG SPNG CNTER NS LX DISP (BAG)
BANDAGE ESMARK 6X9 LF (GAUZE/BANDAGES/DRESSINGS) IMPLANT
BLADE SAG 18X100X1.27 (BLADE) ×2 IMPLANT
BNDG CMPR 9X6 STRL LF SNTH (GAUZE/BANDAGES/DRESSINGS)
BNDG ESMARK 6X9 LF (GAUZE/BANDAGES/DRESSINGS)
BOWL SMART MIX CTS (DISPOSABLE) ×2 IMPLANT
BSPLAT TIB 5D D CMNT STM LT (Knees) ×1 IMPLANT
CEMENT BONE REFOBACIN R1X40 US (Cement) ×4 IMPLANT
CLSR STERI-STRIP ANTIMIC 1/2X4 (GAUZE/BANDAGES/DRESSINGS) ×4 IMPLANT
COMP FEM CMT PERS SZ4 LT (Joint) ×2 IMPLANT
COMPONENT FEM CMT PERS SZ4 LT (Joint) ×1 IMPLANT
COOLER ICEMAN CLASSIC (MISCELLANEOUS) ×2 IMPLANT
COVER SURGICAL LIGHT HANDLE (MISCELLANEOUS) ×2 IMPLANT
CUFF TOURN SGL QUICK 34 (TOURNIQUET CUFF) ×2
CUFF TOURN SGL QUICK 42 (TOURNIQUET CUFF) IMPLANT
CUFF TRNQT CYL 34X4.125X (TOURNIQUET CUFF) ×1 IMPLANT
DERMABOND ADVANCED (GAUZE/BANDAGES/DRESSINGS) ×1
DERMABOND ADVANCED .7 DNX12 (GAUZE/BANDAGES/DRESSINGS) ×1 IMPLANT
DRAPE EXTREMITY T 121X128X90 (DISPOSABLE) ×2 IMPLANT
DRAPE HALF SHEET 40X57 (DRAPES) ×2 IMPLANT
DRAPE INCISE IOBAN 66X45 STRL (DRAPES) IMPLANT
DRAPE ORTHO SPLIT 77X108 STRL (DRAPES) ×4
DRAPE POUCH INSTRU U-SHP 10X18 (DRAPES) ×2 IMPLANT
DRAPE SURG ORHT 6 SPLT 77X108 (DRAPES) ×2 IMPLANT
DRAPE U-SHAPE 47X51 STRL (DRAPES) ×4 IMPLANT
DRSG AQUACEL AG ADV 3.5X10 (GAUZE/BANDAGES/DRESSINGS) ×2 IMPLANT
DURAPREP 26ML APPLICATOR (WOUND CARE) ×6 IMPLANT
ELECT CAUTERY BLADE 6.4 (BLADE) ×2 IMPLANT
ELECT REM PT RETURN 9FT ADLT (ELECTROSURGICAL) ×2
ELECTRODE REM PT RTRN 9FT ADLT (ELECTROSURGICAL) ×1 IMPLANT
GLOVE SURG LTX SZ7 (GLOVE) ×6 IMPLANT
GLOVE SURG NEOP MICRO LF SZ7.5 (GLOVE) ×6 IMPLANT
GLOVE SURG SYN 7.5  E (GLOVE) ×8
GLOVE SURG SYN 7.5 E (GLOVE) ×4 IMPLANT
GLOVE SURG UNDER POLY LF SZ7 (GLOVE) ×10 IMPLANT
GOWN STRL REIN XL XLG (GOWN DISPOSABLE) ×2 IMPLANT
GOWN STRL REUS W/ TWL LRG LVL3 (GOWN DISPOSABLE) ×1 IMPLANT
GOWN STRL REUS W/TWL LRG LVL3 (GOWN DISPOSABLE) ×2
HANDPIECE INTERPULSE COAX TIP (DISPOSABLE) ×2
HDLS TROCR DRIL PIN KNEE 75 (PIN) ×8
HOOD PEEL AWAY FLYTE STAYCOOL (MISCELLANEOUS) ×4 IMPLANT
INSERT ASF PERS 11 CD/4-5 LT (Insert) ×2 IMPLANT
IV NS 1000ML (IV SOLUTION) ×2
IV NS 1000ML BAXH (IV SOLUTION) ×1 IMPLANT
JET LAVAGE IRRISEPT WOUND (IRRIGATION / IRRIGATOR) ×2
KIT BASIN OR (CUSTOM PROCEDURE TRAY) ×2 IMPLANT
KIT TURNOVER KIT B (KITS) ×2 IMPLANT
LAVAGE JET IRRISEPT WOUND (IRRIGATION / IRRIGATOR) ×1 IMPLANT
MANIFOLD NEPTUNE II (INSTRUMENTS) ×2 IMPLANT
MARKER SKIN DUAL TIP RULER LAB (MISCELLANEOUS) ×2 IMPLANT
NEEDLE SPNL 18GX3.5 QUINCKE PK (NEEDLE) ×4 IMPLANT
NS IRRIG 1000ML POUR BTL (IV SOLUTION) ×2 IMPLANT
PACK TOTAL JOINT (CUSTOM PROCEDURE TRAY) ×2 IMPLANT
PAD ARMBOARD 7.5X6 YLW CONV (MISCELLANEOUS) ×4 IMPLANT
PIN DRILL HDLS TROCAR 75 4PK (PIN) ×4 IMPLANT
SAW OSC TIP CART 19.5X105X1.3 (SAW) ×2 IMPLANT
SCREW FEMALE HEX FIX 25X2.5 (ORTHOPEDIC DISPOSABLE SUPPLIES) ×2 IMPLANT
SET HNDPC FAN SPRY TIP SCT (DISPOSABLE) ×1 IMPLANT
STAPLER VISISTAT 35W (STAPLE) IMPLANT
STEM POLY PAT PLY 29M KNEE (Knees) ×2 IMPLANT
STEM TIBIA 5 DEG SZ D L KNEE (Knees) ×1 IMPLANT
SUCTION FRAZIER HANDLE 10FR (MISCELLANEOUS) ×2
SUCTION FRAZIER TIP 8 FR DISP (SUCTIONS) ×2
SUCTION TUBE FRAZIER 10FR DISP (MISCELLANEOUS) ×1 IMPLANT
SUCTION TUBE FRAZIER 8FR DISP (SUCTIONS) ×1 IMPLANT
SUT ETHILON 2 0 FS 18 (SUTURE) IMPLANT
SUT MNCRL AB 4-0 PS2 18 (SUTURE) IMPLANT
SUT VIC AB 0 CT1 27 (SUTURE) ×4
SUT VIC AB 0 CT1 27XBRD ANBCTR (SUTURE) ×2 IMPLANT
SUT VIC AB 1 CTX 27 (SUTURE) ×6 IMPLANT
SUT VIC AB 2-0 CT1 27 (SUTURE) ×8
SUT VIC AB 2-0 CT1 TAPERPNT 27 (SUTURE) ×4 IMPLANT
SYR 50ML LL SCALE MARK (SYRINGE) ×4 IMPLANT
TIBIA STEM 5 DEG SZ D L KNEE (Knees) ×2 IMPLANT
TOWEL GREEN STERILE (TOWEL DISPOSABLE) ×2 IMPLANT
TOWEL GREEN STERILE FF (TOWEL DISPOSABLE) ×2 IMPLANT
TRAY CATH 16FR W/PLASTIC CATH (SET/KITS/TRAYS/PACK) IMPLANT
UNDERPAD 30X36 HEAVY ABSORB (UNDERPADS AND DIAPERS) ×2 IMPLANT
YANKAUER SUCT BULB TIP NO VENT (SUCTIONS) ×2 IMPLANT

## 2020-10-27 NOTE — Transfer of Care (Signed)
Immediate Anesthesia Transfer of Care Note  Patient: Leslie Davis  Procedure(s) Performed: LEFT TOTAL KNEE ARTHROPLASTY (Left: Knee)  Patient Location: PACU  Anesthesia Type:MAC and Spinal  Level of Consciousness: awake and drowsy  Airway & Oxygen Therapy: Patient Spontanous Breathing and Patient connected to face mask oxygen  Post-op Assessment: Report given to RN and Post -op Vital signs reviewed and stable  Post vital signs: Reviewed and stable  Last Vitals:  Vitals Value Taken Time  BP 116/61 10/27/20 1156  Temp    Pulse 52 10/27/20 1157  Resp 13 10/27/20 1157  SpO2 100 % 10/27/20 1157  Vitals shown include unvalidated device data.  Last Pain:  Vitals:   10/27/20 0759  TempSrc:   PainSc: 0-No pain         Complications: No notable events documented.

## 2020-10-27 NOTE — Discharge Instructions (Signed)

## 2020-10-27 NOTE — Anesthesia Postprocedure Evaluation (Signed)
Anesthesia Post Note  Patient: Leslie Davis  Procedure(s) Performed: LEFT TOTAL KNEE ARTHROPLASTY (Left: Knee)     Patient location during evaluation: Nursing Unit Anesthesia Type: Regional and Spinal Level of consciousness: oriented and awake and alert Pain management: pain level controlled Vital Signs Assessment: post-procedure vital signs reviewed and stable Respiratory status: spontaneous breathing and respiratory function stable Cardiovascular status: blood pressure returned to baseline and stable Postop Assessment: no headache, no backache, no apparent nausea or vomiting, patient able to bend at knees and spinal receding Anesthetic complications: no   No notable events documented.  Last Vitals:  Vitals:   10/27/20 1157 10/27/20 1210  BP: 116/61 (!) 134/59  Pulse: (!) 52 (!) 50  Resp: 13 13  Temp: (!) 36.1 C   SpO2: 100% 96%    Last Pain:  Vitals:   10/27/20 1210  TempSrc:   PainSc: 0-No pain                 Merlinda Frederick

## 2020-10-27 NOTE — Evaluation (Signed)
Physical Therapy Evaluation Patient Details Name: Leslie Davis MRN: [id number] DOB: 07/17/1955 Today's Date: 10/27/2020   History of Present Illness  Pt adm 8/8 for lt TKR. PMH - DM, HTN, rheumatoid arthritis  Clinical Impression  Pt presents to PT with expected decr in mobility after Lt TKR. Pt nauseous today but tolerated short amb in hallway. Should progress steadily and she plans to return home with her husband.     Follow Up Recommendations Follow surgeon's recommendation for DC plan and follow-up therapies    Equipment Recommendations  Rolling walker with 5" wheels;3in1 (PT)    Recommendations for Other Services       Precautions / Restrictions Precautions Precautions: Knee Restrictions Weight Bearing Restrictions: Yes LLE Weight Bearing: Weight bearing as tolerated      Mobility  Bed Mobility Overal bed mobility: Needs Assistance Bed Mobility: Supine to Sit     Supine to sit: Min assist     General bed mobility comments: Assist to move LLE    Transfers Overall transfer level: Needs assistance Equipment used: Rolling walker (2 wheeled) Transfers: Sit to/from Stand Sit to Stand: Min assist         General transfer comment: Assist to bring hips up. Verbal cues for hand placement  Ambulation/Gait Ambulation/Gait assistance: Min guard Gait Distance (Feet): 70 Feet Assistive device: Rolling walker (2 wheeled) Gait Pattern/deviations: Step-through pattern;Decreased stance time - left Gait velocity: decr Gait velocity interpretation: <1.31 ft/sec, indicative of household ambulator General Gait Details: Assist for safety. Verbal cues for gait sequence and not to step past walker initially. Pt with smoother gait pattern as distance increased  Stairs            Wheelchair Mobility    Modified Rankin (Stroke Patients Only)       Balance Overall balance assessment: No apparent balance deficits (not formally assessed)                                            Pertinent Vitals/Pain Pain Assessment: 0-10 Pain Score: 10-Worst pain ever Pain Location: lt knee Pain Descriptors / Indicators: Grimacing;Guarding;Aching Pain Intervention(s): Limited activity within patient's tolerance;Monitored during session;Repositioned    Home Living Family/patient expects to be discharged to:: Private residence Living Arrangements: Spouse/significant other Available Help at Discharge: Family;Available 24 hours/day Type of Home: House Home Access: Stairs to enter Entrance Stairs-Rails: Right Entrance Stairs-Number of Steps: 3 Home Layout: One level Home Equipment: None      Prior Function Level of Independence: Independent               Hand Dominance        Extremity/Trunk Assessment   Upper Extremity Assessment Upper Extremity Assessment: Overall WFL for tasks assessed    Lower Extremity Assessment Lower Extremity Assessment: LLE deficits/detail LLE Deficits / Details: good quad set, AAROM 5-85 degrees in sitting    Cervical / Trunk Assessment Cervical / Trunk Assessment: Normal  Communication   Communication: No difficulties  Cognition Arousal/Alertness: Awake/alert Behavior During Therapy: WFL for tasks assessed/performed Overall Cognitive Status: Within Functional Limits for tasks assessed                                        General Comments      Exercises Total Joint Exercises Ankle  Circles/Pumps: AROM;5 reps;Seated;Both Quad Sets: AROM;5 reps;Left;Seated Long Arc Quad: AAROM;Left;5 reps;Seated Knee Flexion: AAROM;Left;5 reps;Seated Goniometric ROM: 5-85   Assessment/Plan    PT Assessment Patient needs continued PT services  PT Problem List Decreased strength;Decreased range of motion;Decreased mobility;Decreased knowledge of use of DME;Pain       PT Treatment Interventions DME instruction;Gait training;Therapeutic activities;Stair training;Therapeutic  exercise;Functional mobility training;Patient/family education    PT Goals (Current goals can be found in the Care Plan section)  Acute Rehab PT Goals Patient Stated Goal: return home PT Goal Formulation: With patient Time For Goal Achievement: 10/31/20 Potential to Achieve Goals: Good    Frequency 7X/week   Barriers to discharge Inaccessible home environment stairs to enter    Co-evaluation               AM-PAC PT "6 Clicks" Mobility  Outcome Measure Help needed turning from your back to your side while in a flat bed without using bedrails?: A Little Help needed moving from lying on your back to sitting on the side of a flat bed without using bedrails?: A Little Help needed moving to and from a bed to a chair (including a wheelchair)?: A Little Help needed standing up from a chair using your arms (e.g., wheelchair or bedside chair)?: A Little Help needed to walk in hospital room?: A Little Help needed climbing 3-5 steps with a railing? : A Little 6 Click Score: 18    End of Session Equipment Utilized During Treatment: Gait belt Activity Tolerance: Patient tolerated treatment well Patient left: in chair;with call bell/phone within reach Nurse Communication: Mobility status PT Visit Diagnosis: Other abnormalities of gait and mobility (R26.89);Pain Pain - Right/Left: Left Pain - part of body: Knee    Time: [id number] PT Time Calculation (min) (ACUTE ONLY): 25 min   Charges:   PT Evaluation $PT Eval Low Complexity: 1 Low PT Treatments $Gait Training: 8-22 mins        [place] Pager [phone] Office [place] 10/27/2020, 6:37 PM

## 2020-10-27 NOTE — H&P (Signed)
PREOPERATIVE H&P  Chief Complaint: left knee degenerative joint disease  HPI: Leslie Davis is a 65 y.o. female who presents for surgical treatment of left knee degenerative joint disease.  She denies any changes in medical history.  Past Medical History:  Diagnosis Date   DM (diabetes mellitus) ([place])    Fatty liver disease, nonalcoholic    Gastritis    Gastroparesis    GERD (gastroesophageal reflux disease)    Heartburn    Hiatal hernia    HLD (hyperlipidemia)    HTN (hypertension)    Joint pain    Renal calculus    Rheumatoid arthritis ([place])    Vitamin D deficiency    Past Surgical History:  Procedure Laterality Date   CHOLECYSTECTOMY     COLONOSCOPY     TUBAL LIGATION     UPPER GASTROINTESTINAL ENDOSCOPY     Social History   Socioeconomic History   Marital status: Married    Spouse name: Morning Halberg   Number of children: 2   Years of education: Not on file   Highest education level: Not on file  Occupational History   Occupation: Housekeeping    Comment: Estate agent Service  Tobacco Use   Smoking status: Former    Packs/day: 0.25    Years: 1.00    Pack years: 0.25    Types: Cigarettes    Quit date: 09/10/1974    Years since quitting: 46.1   Smokeless tobacco: Never   Tobacco comments:    she no longer smokes.   Vaping Use   Vaping Use: Never used  Substance and Sexual Activity   Alcohol use: No   Drug use: No   Sexual activity: Not Currently  Other Topics Concern   Not on file  Social History Narrative   Lives with husband.  Part time job.    Social Determinants of Health   Financial Resource Strain: Low Risk    Difficulty of Paying Living Expenses: Not hard at all  Food Insecurity: No Food Insecurity   Worried About Charity fundraiser in the Last Year: Never true   [place] in the Last Year: Never true  Transportation Needs: No Transportation Needs   Lack of Transportation (Medical): No   Lack of Transportation  (Non-Medical): No  Physical Activity: Sufficiently Active   Days of Exercise per Week: 2 days   Minutes of Exercise per Session: 120 min  Stress: No Stress Concern Present   Feeling of Stress : Not at all  Social Connections: Not on file   Family History  Problem Relation Age of Onset   Diabetes Mother    Thyroid disease Mother    Kidney disease Mother    Diabetes Maternal Grandmother    Stomach cancer Maternal Aunt        X 2 aunts   Hypertension Father    Hyperlipidemia Father    Sudden death Father    Stroke Father    Colon cancer Neg Hx    Esophageal cancer Neg Hx    Rectal cancer Neg Hx    Allergies  Allergen Reactions   Fiasp [Insulin Aspart (W-Niacinamide)] Hives   Prior to Admission medications   Medication Sig Start Date End Date Taking? Authorizing Provider  acetaminophen-codeine (TYLENOL #3) 300-30 MG tablet Take 1 tablet by mouth every 6 (six) hours as needed for moderate pain. 10/03/20  Yes Aundra Dubin, PA-C  amLODipine (NORVASC) 10 MG tablet Take 1 tablet (10 mg  total) by mouth daily. 08/29/20  Yes [name], MD  Azilsartan Medoxomil (EDARBI) 80 MG TABS TAKE 1 TABLET EACH DAY. 08/28/20  Yes [name], MD  cetirizine (ZYRTEC) 10 MG tablet Take 1 tablet (10 mg total) by mouth daily. 12/12/19  Yes [name], MD  Cholecalciferol (VITAMIN D3) 1.25 MG (50000 UT) CAPS Take 1 capsule by mouth every 2 wks Patient taking differently: Take 50,000 Units by mouth every 14 (fourteen) days. 10/08/20  Yes Opalski, Neoma Laming, DO  ciprofloxacin (CIPRO) 500 MG tablet Take 1 tablet (500 mg total) by mouth 2 (two) times daily for 6 days. 10/23/20 10/29/20 Yes Aundra Dubin, PA-C  ferrous gluconate (FERGON) 324 MG tablet TAKE 1 TABLET BY MOUTH TWICE DAILY WITH A MEAL. Patient taking differently: Take 324 mg by mouth 2 (two) times daily with a meal. 05/28/19  Yes Mansouraty, Telford Nab., MD  ibuprofen (ADVIL) 200 MG tablet Take 400 mg by mouth every 6 (six) hours as needed for  mild pain.   Yes [provider]  insulin glargine, 1 Unit Dial, (TOUJEO SOLOSTAR) 300 UNIT/ML Solostar Pen Inject 35 units subcutaneous at bedtime 09/29/20  Yes [name], MD  insulin lispro (HUMALOG KWIKPEN) 100 UNIT/ML KwikPen Sliding scale Patient taking differently: Inject 4-8 Units into the skin daily as needed (CBG over 150). Sliding scale 12/13/19  Yes [name], MD  pantoprazole (PROTONIX) 40 MG tablet Take one tablet by mouth 3 days a week Patient taking differently: Take 40 mg by mouth 3 (three) times a week. 08/28/20  Yes [name], MD  rosuvastatin (CRESTOR) 20 MG tablet Take 1 tablet (20 mg total) by mouth daily. 05/14/19  Yes [name], MD  Semaglutide, 1 MG/DOSE, (OZEMPIC, 1 MG/DOSE,) 4 MG/3ML SOPN Inject 1 mg into the skin once a week. Patient taking differently: Inject 1 mg into the skin once a week. Tuesday 09/01/20  Yes [name], MD  temazepam (RESTORIL) 30 MG capsule One capsules po qhs prn Patient taking differently: Take 30 mg by mouth at bedtime as needed for sleep. 05/06/20  Yes [name], MD  aspirin EC 81 MG tablet Take 1 tablet (81 mg total) by mouth 2 (two) times daily. To be taken after surgery 10/21/20   Aundra Dubin, PA-C  benzonatate (TESSALON PERLES) 100 MG capsule Take 1 capsule (100 mg total) by mouth every 6 (six) hours as needed for cough. 07/10/20 07/10/21  Bary Castilla, NP  Blood Glucose Monitoring Suppl (ONETOUCH VERIO) w/Device KIT Use as directed to check blood sugars 2 times per day dx: e11.65 02/13/19   [name], MD  docusate sodium (COLACE) 100 MG capsule Take 1 capsule (100 mg total) by mouth daily as needed. Patient taking differently: Take 100 mg by mouth daily as needed for mild constipation. 10/21/20 10/21/21  Aundra Dubin, PA-C  glucose blood (ONETOUCH VERIO) test strip Use as instructed to check blood sugars 2 times per day dx:e11.65 02/18/20   [name], MD  HYDROcodone bit-homatropine (HYDROMET)  5-1.5 MG/5ML syrup Take 5 mLs by mouth every 6 (six) hours as needed for cough. Patient not taking: No sig reported 10/09/20   [name], FNP  Insulin Pen Needle 32G X 6 MM MISC Use with pen injectors daily dx code: e11.9 10/31/19   [name], MD  linaclotide [facility]) 145 MCG CAPS capsule Take 1 capsule (145 mcg total) by mouth daily. 10/22/20   [name], DO  Magnesium 400 MG TABS One tab po qpm Patient not taking:  No sig reported 02/21/20   [name], MD  methocarbamol (ROBAXIN) 500 MG tablet Take 1 tablet (500 mg total) by mouth 2 (two) times daily as needed. To be taken after surgery 10/21/20   Aundra Dubin, PA-C  ondansetron (ZOFRAN) 4 MG tablet Take 1 tablet (4 mg total) by mouth every 8 (eight) hours as needed for nausea or vomiting. To be taken after surgery 10/21/20   Aundra Dubin, PA-C  OneTouch Delica Lancets 20Q MISC Use as directed to check blood sugars 2 times per day dx:e11.65 02/13/19   [name], MD  oxyCODONE-acetaminophen (PERCOCET) 5-325 MG tablet Take 1-2 tablets by mouth every 6 (six) hours as needed. To be taken after surgery Patient taking differently: Take 1-2 tablets by mouth every 6 (six) hours as needed for moderate pain. To be taken after surgery 10/21/20   Aundra Dubin, PA-C  sulfamethoxazole-trimethoprim (BACTRIM DS) 800-160 MG tablet Take 1 tablet by mouth 2 (two) times daily. To be taken after surgery 10/21/20   Aundra Dubin, PA-C     Positive ROS: All other systems have been reviewed and were otherwise negative with the exception of those mentioned in the HPI and as above.  Physical Exam: General: Alert, no acute distress Cardiovascular: No pedal edema Respiratory: No cyanosis, no use of accessory musculature GI: abdomen soft Skin: No lesions in the area of chief complaint Neurologic: Sensation intact distally Psychiatric: Patient is competent for consent with normal mood and affect Lymphatic: no lymphedema  MUSCULOSKELETAL:  exam stable  Assessment: left knee degenerative joint disease  Plan: Plan for Procedure(s): LEFT TOTAL KNEE ARTHROPLASTY  The risks benefits and alternatives were discussed with the patient including but not limited to the risks of nonoperative treatment, versus surgical intervention including infection, bleeding, nerve injury,  blood clots, cardiopulmonary complications, morbidity, mortality, among others, and they were willing to proceed.   Preoperative templating of the joint replacement has been completed, documented, and submitted to the Operating Room personnel in order to optimize intra-operative equipment management.   Eduard Roux, MD 10/27/2020 8:33 AM

## 2020-10-27 NOTE — Op Note (Signed)
Total Knee Arthroplasty Procedure Note  Preoperative diagnosis: Left knee osteoarthritis  Postoperative diagnosis:same  Operative procedure: Left total knee arthroplasty. CPT [phone]  Surgeon: N. Eduard Roux, MD  Assist: Madalyn Rob, PA-C; necessary for the timely completion of procedure and due to complexity of procedure.  Anesthesia: Spinal, regional, local  Tourniquet time: see anesthesia record  Implants used: Zimmer persona Femur: CR 4 Tibia: D Patella: 29 mm Polyethylene: 11 mm, MC  Indication: Leslie Davis is a 65 y.o. year old female with a history of knee pain. Having failed conservative management, the patient elected to proceed with a total knee arthroplasty.  We have reviewed the risk and benefits of the surgery and they elected to proceed after voicing understanding.  Procedure:  After informed consent was obtained and understanding of the risk were voiced including but not limited to bleeding, infection, damage to surrounding structures including nerves and vessels, blood clots, leg length inequality and the failure to achieve desired results, the operative extremity was marked with verbal confirmation of the patient in the holding area.   The patient was then brought to the operating room and transported to the operating room table in the supine position.  A tourniquet was applied to the operative extremity around the upper thigh. The operative limb was then prepped and draped in the usual sterile fashion and preoperative antibiotics were administered.  A time out was performed prior to the start of surgery confirming the correct extremity, preoperative antibiotic administration, as well as team members, implants and instruments available for the case. Correct surgical site was also confirmed with preoperative radiographs. The limb was then elevated for exsanguination and the tourniquet was inflated. A midline incision was made and a standard medial  parapatellar approach was performed.  The infrapatellar fat pad was removed.  Suprapatellar synovium was removed to reveal the anterior distal femoral cortex.  A medial peel was performed to release the capsule of the medial tibial plateau.  The patella was then everted and was prepared and sized to a 29 mm.  A cover was placed on the patella for protection from retractors.  The knee was then brought into full flexion and we then turned our attention to the femur.  The cruciates were sacrificed.  Start site was drilled in the femur and the intramedullary distal femoral cutting guide was placed, set at 5 degrees valgus, taking 10 mm of distal resection. The distal cut was made. Osteophytes were then removed.  Next, the proximal tibial cutting guide was placed with appropriate slope, varus/valgus alignment and depth of resection. The proximal tibial cut was made. Gap blocks were then used to assess the extension gap and alignment, and appropriate soft tissue releases were performed. Attention was turned back to the femur, which was sized using the sizing guide to a size 4. Appropriate rotation of the femoral component was determined using epicondylar axis, Whiteside's line, and assessing the flexion gap under ligament tension. The appropriate size 4-in-1 cutting block was placed and checked with an angel wing and cuts were made. Posterior femoral osteophytes and uncapped bone were then removed with the curved osteotome.  Trial components were placed, and stability was checked in full extension, mid-flexion, and deep flexion. Proper tibial rotation was determined and marked.  The patella tracked well without a lateral release.  The femoral lugs were then drilled. Trial components were then removed and tibial preparation performed.  The tibia was sized for a size D component.   The bony  surfaces were irrigated with a pulse lavage and then dried. Bone cement was vacuum mixed on the back table, and the final components  sized above were cemented into place.  Antibiotic irrigation was placed in the knee joint and soft tissues while the cement cured.  After cement had finished curing, excess cement was removed. The stability of the construct was re-evaluated throughout a range of motion and found to be acceptable. The trial liner was removed, the knee was copiously irrigated, and the knee was re-evaluated for any excess bone debris. The real polyethylene liner, 11 mm thick, was inserted and checked to ensure the locking mechanism had engaged appropriately. The tourniquet was deflated and hemostasis was achieved. The wound was irrigated with normal saline.  One gram of vancomycin powder was placed in the surgical bed.  Capsular closure was performed with a #1 vicryl, subcutaneous fat closed with a 0 vicryl suture, then subcutaneous tissue closed with interrupted 2.0 vicryl suture. The skin was then closed with a 2.0 nylon and dermabond. A sterile dressing was applied.  The patient was awakened in the operating room and taken to recovery in stable condition. All sponge, needle, and instrument counts were correct at the end of the case.  Leslie Davis was necessary for opening, closing, retracting, limb positioning and overall facilitation and completion of the surgery.  Position: supine  Complications: none.  Time Out: performed   Drains/Packing: none  Estimated blood loss: minimal  Returned to Recovery Room: in good condition.   Antibiotics: yes   Mechanical VTE (DVT) Prophylaxis: sequential compression devices, TED thigh-high  Chemical VTE (DVT) Prophylaxis: aspirin  Fluid Replacement  Crystalloid: see anesthesia record Blood: none  FFP: none   Specimens Removed: 1 to pathology   Sponge and Instrument Count Correct? yes   PACU: portable radiograph - knee AP and Lateral   Plan/RTC: Return in 2 weeks for wound check.   Weight Bearing/Load Lower Extremity: full   Implant Name Type Inv. Item Serial  No. Manufacturer Lot No. LRB No. Used Action  CEMENT BONE REFOBACIN [id number] [place] - [id number] Cement CEMENT BONE REFOBACIN [id number] [place]  ZIMMER RECON(ORTH,TRAU,BIO,SG) [id number] Left 1 Implanted  CEMENT BONE REFOBACIN [id number] [place] - [id number] Cement CEMENT BONE REFOBACIN [id number] [place]  ZIMMER RECON(ORTH,TRAU,BIO,SG) [id number] Left 1 Implanted  2.5 mm Female Hex Screw Screw   Zimmer [id number] Left 1 Implanted  TIBIA STEM 5 DEG SZ D L KNEE - [id number] Knees TIBIA STEM 5 DEG SZ D L KNEE  ZIMMER RECON(ORTH,TRAU,BIO,SG) [id number] Left 1 Implanted  STEM POLY PAT PLY 40M KNEE - [id number] Knees STEM POLY PAT PLY 40M KNEE  ZIMMER RECON(ORTH,TRAU,BIO,SG) [id number] Left 1 Implanted  COMP FEM CMT PERS SZ4 LT - [id number] Joint COMP FEM CMT PERS SZ4 LT  ZIMMER RECON(ORTH,TRAU,BIO,SG) [id number] Left 1 Implanted  INSERT ASF PERS 11 CD/4-5 LT - [id number] Insert INSERT ASF PERS 11 CD/4-5 LT  ZIMMER RECON(ORTH,TRAU,BIO,SG) [id number] Left 1 Implanted    N. Eduard Roux, MD [facility] 11:10 AM

## 2020-10-27 NOTE — Progress Notes (Signed)
   10/27/20 2249  Assess: MEWS Score  Temp 98.5 F (36.9 C)  BP (!) 203/62 (Dr Lorin Mercy aware)  Pulse Rate (!) 59  Resp 19  SpO2 97 %  O2 Device Room Air  Assess: MEWS Score  MEWS Temp 0  MEWS Systolic 2  MEWS Pulse 0  MEWS RR 0  MEWS LOC 0  MEWS Score 2  MEWS Score Color Yellow  Assess: if the MEWS score is Yellow or Red  Were vital signs taken at a resting state? Yes  Focused Assessment No change from prior assessment  Early Detection of Sepsis Score *See Row Information* Low  MEWS guidelines implemented *See Row Information* Yes  Treat  MEWS Interventions Administered scheduled meds/treatments  Pain Scale 0-10  Pain Score 6  Pain Type Surgical pain  Pain Location Knee  Pain Orientation Left  Pain Descriptors / Indicators Aching;Dull  Pain Frequency Constant  Pain Onset On-going  Patients Stated Pain Goal 3  Pain Intervention(s) Cold applied;Repositioned  Multiple Pain Sites No  Take Vital Signs  Increase Vital Sign Frequency  Yellow: Q 2hr X 2 then Q 4hr X 2, if remains yellow, continue Q 4hrs  Escalate  MEWS: Escalate Yellow: discuss with charge nurse/RN and consider discussing with provider and RRT  Notify: Charge Nurse/RN  Name of Charge Nurse/RN Notified Barnett Applebaum  Date Charge Nurse/RN Notified 10/27/20  Time Charge Nurse/RN Notified 2300  Notify: Provider  Provider Name/Title Dr Lorin Mercy  Date Provider Notified 10/27/20  Time Provider Notified 2302  Notification Type Call  Notification Reason Other (Comment) (High Bld Pressure)  Provider response See new orders  Date of Provider Response 10/27/20  Time of Provider Response 2303  Document  Progress note created (see row info) Yes

## 2020-10-27 NOTE — Anesthesia Procedure Notes (Signed)
Procedure Name: MAC Date/Time: 10/27/2020 9:40 AM Performed by: Trinna Post., CRNA Pre-anesthesia Checklist: Patient identified, Emergency Drugs available, Suction available, Patient being monitored and Timeout performed Patient Re-evaluated:Patient Re-evaluated prior to induction Oxygen Delivery Method: Simple face mask Preoxygenation: Pre-oxygenation with 100% oxygen Induction Type: IV induction Placement Confirmation: positive ETCO2

## 2020-10-27 NOTE — Anesthesia Procedure Notes (Signed)
Spinal  Patient location during procedure: OR Start time: 10/27/2020 9:35 AM End time: 10/27/2020 9:40 AM Reason for block: surgical anesthesia Staffing Performed: anesthesiologist  Anesthesiologist: Merlinda Frederick, MD Preanesthetic Checklist Completed: patient identified, IV checked, risks and benefits discussed, surgical consent, monitors and equipment checked, pre-op evaluation and timeout performed Spinal Block Patient position: sitting Prep: DuraPrep Patient monitoring: cardiac monitor, continuous pulse ox and blood pressure Approach: midline Location: L3-4 Injection technique: single-shot Needle Needle type: Pencan  Needle gauge: 24 G Needle length: 9 cm Assessment Events: CSF return Additional Notes Functioning IV was confirmed and monitors were applied. Sterile prep and drape, including hand hygiene and sterile gloves were used. The patient was positioned and the spine was prepped. The skin was anesthetized with lidocaine.  Free flow of clear CSF was obtained prior to injecting local anesthetic into the CSF.  The spinal needle aspirated freely following injection.  The needle was carefully withdrawn.  The patient tolerated the procedure well.

## 2020-10-27 NOTE — Anesthesia Procedure Notes (Signed)
Anesthesia Regional Block: Adductor canal block   Pre-Anesthetic Checklist: , timeout performed,  Correct Patient, Correct Site, Correct Laterality,  Correct Procedure, Correct Position, site marked,  Risks and benefits discussed,  Surgical consent,  Pre-op evaluation,  At surgeon's request and post-op pain management  Laterality: Left  Prep: chloraprep       Needles:  Injection technique: Single-shot  Needle Type: Echogenic Stimulator Needle     Needle Length: 10cm  Needle Gauge: 21     Additional Needles:   Procedures:,,,, ultrasound used (permanent image in chart),,     Nerve Stimulator or Paresthesia:  Response: 0.45 mA  Additional Responses:   Narrative:  Start time: 10/27/2020 8:25 AM End time: 10/27/2020 8:30 AM Injection made incrementally with aspirations every 5 mL.  Performed by: Personally  Anesthesiologist: Merlinda Frederick, MD  Additional Notes: Functioning IV was confirmed and monitors were applied.  Sterile prep and drape,hand hygiene and sterile gloves were used. Ultrasound guidance: relevant anatomy identified, needle position confirmed, local anesthetic spread visualized around nerve(s)., vascular puncture avoided.  Image printed for medical record. Negative aspiration and negative test dose prior to incremental administration of local anesthetic. The patient tolerated the procedure well.

## 2020-10-28 ENCOUNTER — Telehealth: Payer: Self-pay | Admitting: Orthopaedic Surgery

## 2020-10-28 DIAGNOSIS — Z794 Long term (current) use of insulin: Secondary | ICD-10-CM | POA: Diagnosis not present

## 2020-10-28 DIAGNOSIS — I10 Essential (primary) hypertension: Secondary | ICD-10-CM | POA: Diagnosis not present

## 2020-10-28 DIAGNOSIS — E119 Type 2 diabetes mellitus without complications: Secondary | ICD-10-CM | POA: Diagnosis not present

## 2020-10-28 DIAGNOSIS — Z79899 Other long term (current) drug therapy: Secondary | ICD-10-CM | POA: Diagnosis not present

## 2020-10-28 DIAGNOSIS — Z7982 Long term (current) use of aspirin: Secondary | ICD-10-CM | POA: Diagnosis not present

## 2020-10-28 DIAGNOSIS — Z87891 Personal history of nicotine dependence: Secondary | ICD-10-CM | POA: Diagnosis not present

## 2020-10-28 DIAGNOSIS — M1712 Unilateral primary osteoarthritis, left knee: Secondary | ICD-10-CM | POA: Diagnosis not present

## 2020-10-28 LAB — GLUCOSE, CAPILLARY
Glucose-Capillary: 118 mg/dL — ABNORMAL HIGH (ref 70–99)
Glucose-Capillary: 211 mg/dL — ABNORMAL HIGH (ref 70–99)

## 2020-10-28 LAB — BASIC METABOLIC PANEL
Anion gap: 8 (ref 5–15)
BUN: 9 mg/dL (ref 8–23)
CO2: 24 mmol/L (ref 22–32)
Calcium: 9 mg/dL (ref 8.9–10.3)
Chloride: 102 mmol/L (ref 98–111)
Creatinine, Ser: 1 mg/dL (ref 0.44–1.00)
GFR, Estimated: >60 mL/min (ref >60)
Glucose, Bld: 91 mg/dL (ref 70–99)
Potassium: 3.6 mmol/L (ref 3.5–5.1)
Sodium: 134 mmol/L — ABNORMAL LOW (ref 135–145)

## 2020-10-28 LAB — CBC
HCT: 35 % — ABNORMAL LOW (ref 36.0–46.0)
Hemoglobin: 11.6 g/dL — ABNORMAL LOW (ref 12.0–15.0)
MCH: 28.4 pg (ref 26.0–34.0)
MCHC: 33.1 g/dL (ref 30.0–36.0)
MCV: 85.6 fL (ref 80.0–100.0)
Platelets: 232 K/uL (ref 150–400)
RBC: 4.09 MIL/uL (ref 3.87–5.11)
RDW: 14 % (ref 11.5–15.5)
WBC: 6.3 K/uL (ref 4.0–10.5)
nRBC: 0 % (ref 0.0–0.2)

## 2020-10-28 NOTE — Progress Notes (Signed)
Physical Therapy Treatment Patient Details Name: Leslie Davis MRN: [id number] DOB: 04/11/1955 Today's Date: 10/28/2020    History of Present Illness Pt adm 8/8 for lt TKR. PMH - DM, HTN, rheumatoid arthritis    PT Comments    Continuing work on functional mobility and activity tolerance;  Considerable pain, having just walked to and from the bathroom, and had recently had pain pill, so opted to help her with in-room mobility to the recliner, and will return after giving time for the pain pill to become effective   Follow Up Recommendations  Follow surgeon's recommendation for DC plan and follow-up therapies     Equipment Recommendations       Recommendations for Other Services       Precautions / Restrictions Precautions Precautions: Knee Restrictions Weight Bearing Restrictions: Yes LLE Weight Bearing: Weight bearing as tolerated    Mobility  Bed Mobility               General bed mobility comments: EOB upon arrival    Transfers Overall transfer level: Needs assistance Equipment used: Rolling walker (2 wheeled) Transfers: Sit to/from Stand Sit to Stand: Min assist         General transfer comment: Cues for hand placement; Heavy antalgic R lean during rise  Ambulation/Gait Ambulation/Gait assistance: Min guard Gait Distance (Feet): 6 Feet Assistive device: Rolling walker (2 wheeled) Gait Pattern/deviations: Decreased stance time - left Gait velocity: decr   General Gait Details: A few steps bed to recliner; Good suport of self on RW; no overt buckling   Stairs             Wheelchair Mobility    Modified Rankin (Stroke Patients Only)       Balance                                            Cognition Arousal/Alertness: Awake/alert Behavior During Therapy: WFL for tasks assessed/performed Overall Cognitive Status: Within Functional Limits for tasks assessed                                         Exercises      General Comments General comments (skin integrity, edema, etc.): Discussed recovery course, home equipment with Ortho Case Mgr; Discussed knee precautions and importance of keeping nothing under her knee for optimal healing      Pertinent Vitals/Pain Pain Assessment: 0-10 Pain Score: 9  Pain Location: lt knee Pain Descriptors / Indicators: Grimacing;Guarding;Aching Pain Intervention(s): Monitored during session;Other (comment) (opted to wait until pain med in full effect)    Home Living                      Prior Function            PT Goals (current goals can now be found in the care plan section) Acute Rehab PT Goals Patient Stated Goal: decr pain PT Goal Formulation: With patient Time For Goal Achievement: 10/31/20 Potential to Achieve Goals: Good Progress towards PT goals: Progressing toward goals (but limited by pain and nausea at this time)    Frequency    7X/week      PT Plan Current plan remains appropriate    Co-evaluation  AM-PAC PT "6 Clicks" Mobility   Outcome Measure  Help needed turning from your back to your side while in a flat bed without using bedrails?: A Little Help needed moving from lying on your back to sitting on the side of a flat bed without using bedrails?: A Little Help needed moving to and from a bed to a chair (including a wheelchair)?: A Little Help needed standing up from a chair using your arms (e.g., wheelchair or bedside chair)?: A Little Help needed to walk in hospital room?: A Little Help needed climbing 3-5 steps with a railing? : A Little 6 Click Score: 18    End of Session Equipment Utilized During Treatment: Gait belt Activity Tolerance: Patient limited by pain Patient left: in chair;with call bell/phone within reach Nurse Communication: Mobility status PT Visit Diagnosis: Other abnormalities of gait and mobility (R26.89);Pain Pain - Right/Left: Left Pain - part of body:  Knee     Time: [id number] PT Time Calculation (min) (ACUTE ONLY): 14 min  Charges:  $Therapeutic Activity: 8-22 mins                     Roney Marion, PT  Acute Rehabilitation Services Pager [phone] Office [place] 10/28/2020, 9:48 AM

## 2020-10-28 NOTE — Discharge Summary (Signed)
Patient ID: Leslie Davis MRN: [id number] DOB/AGE: [birth year] 67 y.o.  Admit date: 10/27/2020 Discharge date: 10/28/2020  Admission Diagnoses:  Active Problems:   Primary osteoarthritis of left knee   Status post total left knee replacement   Discharge Diagnoses:  Same  Past Medical History:  Diagnosis Date   DM (diabetes mellitus) ([place])    Fatty liver disease, nonalcoholic    Gastritis    Gastroparesis    GERD (gastroesophageal reflux disease)    Heartburn    Hiatal hernia    HLD (hyperlipidemia)    HTN (hypertension)    Joint pain    Renal calculus    Rheumatoid arthritis ([place])    Vitamin D deficiency     Surgeries: Procedure(s): LEFT TOTAL KNEE ARTHROPLASTY on 10/27/2020   Consultants:   Discharged Condition: Improved  Hospital Course: Leslie Davis is an 65 y.o. female who was admitted 10/27/2020 for operative treatment of<principal problem not specified>. Patient has severe unremitting pain that affects sleep, daily activities, and work/hobbies. After pre-op clearance the patient was taken to the operating room on 10/27/2020 and underwent  Procedure(s): LEFT TOTAL KNEE ARTHROPLASTY.    Patient was given perioperative antibiotics:  Anti-infectives (From admission, onward)    Start     Dose/Rate Route Frequency Ordered Stop   10/27/20 1545  ceFAZolin (ANCEF) IVPB 2g/100 mL premix        2 g 200 mL/hr over 30 Minutes Intravenous Every 6 hours 10/27/20 1146 10/27/20 2146   10/27/20 1016  vancomycin (VANCOCIN) powder  Status:  Discontinued          As needed 10/27/20 1016 10/27/20 1151   10/27/20 0745  ceFAZolin (ANCEF) IVPB 2g/100 mL premix        2 g 200 mL/hr over 30 Minutes Intravenous On call to O.R. 10/27/20 0736 10/27/20 1015        Patient was given sequential compression devices, early ambulation, and chemoprophylaxis to prevent DVT.  Patient benefited maximally from hospital stay and there were no complications.    Recent vital signs: Patient  Vitals for the past 24 hrs:  BP Temp Temp src Pulse Resp SpO2  10/28/20 0737 (!) 156/65 98.4 F (36.9 C) Oral 69 19 98 %  10/28/20 0608 (!) 173/58 98.6 F (37 C) Oral (!) 59 17 99 %  10/28/20 0400 (!) 180/62 98.7 F (37.1 C) Oral (!) 58 18 96 %  10/28/20 0135 (!) 164/56 (!) 97.5 F (36.4 C) Oral (!) 56 18 99 %  10/27/20 2321 (!) 193/62 97.7 F (36.5 C) Oral 68 18 98 %  10/27/20 2249 (!) 203/62 98.5 F (36.9 C) Oral (!) 59 19 97 %  10/27/20 1947 (!) 193/51 97.7 F (36.5 C) Oral 62 18 96 %  10/27/20 1624 (!) 175/60 98.2 F (36.8 C) Oral 62 18 99 %  10/27/20 1406 (!) 185/65 97.6 F (36.4 C) Oral (!) 48 20 100 %  10/27/20 1345 (!) 190/61 -- -- (!) 52 20 99 %  10/27/20 1330 (!) 143/60 98 F (36.7 C) -- (!) 54 (!) 25 95 %  10/27/20 1325 -- -- -- (!) 53 16 100 %  10/27/20 1315 (!) 154/58 -- -- (!) 50 19 96 %  10/27/20 1245 (!) 150/59 -- -- (!) 48 15 99 %  10/27/20 1210 (!) 134/59 -- -- (!) 50 13 96 %  10/27/20 1157 116/61 (!) 96.9 F (36.1 C) -- (!) 52 13 100 %  10/27/20 0830 (!) 195/49 -- -- 64  19 100 %     Recent laboratory studies:  Recent Labs    10/28/20 0419  WBC 6.3  HGB 11.6*  HCT 35.0*  PLT 232  NA 134*  K 3.6  CL 102  CO2 24  BUN 9  CREATININE 1.00  GLUCOSE 91  CALCIUM 9.0     Discharge Medications:   Allergies as of 10/28/2020       Reactions   Fiasp [insulin Aspart (w-niacinamide)] Hives        Medication List     STOP taking these medications    acetaminophen-codeine 300-30 MG tablet Commonly known as: TYLENOL #3   ciprofloxacin 500 MG tablet Commonly known as: Cipro   ibuprofen 200 MG tablet Commonly known as: ADVIL       TAKE these medications    amLODipine 10 MG tablet Commonly known as: NORVASC Take 1 tablet (10 mg total) by mouth daily.   aspirin EC 81 MG tablet Take 1 tablet (81 mg total) by mouth 2 (two) times daily. To be taken after surgery   benzonatate 100 MG capsule Commonly known as: Tessalon Perles Take 1  capsule (100 mg total) by mouth every 6 (six) hours as needed for cough.   cetirizine 10 MG tablet Commonly known as: ZYRTEC Take 1 tablet (10 mg total) by mouth daily.   Edarbi 80 MG Tabs Generic drug: Azilsartan Medoxomil TAKE 1 TABLET EACH DAY.   Insulin Pen Needle 32G X 6 MM Misc Use with pen injectors daily dx code: e11.9   linaclotide 145 MCG Caps capsule Commonly known as: [name] Take 1 capsule (145 mcg total) by mouth daily.   methocarbamol 500 MG tablet Commonly known as: Robaxin Take 1 tablet (500 mg total) by mouth 2 (two) times daily as needed. To be taken after surgery   ondansetron 4 MG tablet Commonly known as: Zofran Take 1 tablet (4 mg total) by mouth every 8 (eight) hours as needed for nausea or vomiting. To be taken after surgery   OneTouch Delica Lancets 68L Misc Use as directed to check blood sugars 2 times per day dx:e11.65   OneTouch Verio test strip Generic drug: glucose blood Use as instructed to check blood sugars 2 times per day dx:e11.65   OneTouch Verio w/Device Kit Use as directed to check blood sugars 2 times per day dx: e11.65   rosuvastatin 20 MG tablet Commonly known as: CRESTOR Take 1 tablet (20 mg total) by mouth daily.   sulfamethoxazole-trimethoprim 800-160 MG tablet Commonly known as: BACTRIM DS Take 1 tablet by mouth 2 (two) times daily. To be taken after surgery   Toujeo SoloStar 300 UNIT/ML Solostar Pen Generic drug: insulin glargine (1 Unit Dial) Inject 35 units subcutaneous at bedtime       ASK your doctor about these medications    docusate sodium 100 MG capsule Commonly known as: Colace Take 1 capsule (100 mg total) by mouth daily as needed.   ferrous gluconate 324 MG tablet Commonly known as: FERGON TAKE 1 TABLET BY MOUTH TWICE DAILY WITH A MEAL.   HYDROcodone bit-homatropine 5-1.5 MG/5ML syrup Commonly known as: Hydromet Take 5 mLs by mouth every 6 (six) hours as needed for cough.   insulin lispro 100  UNIT/ML KwikPen Commonly known as: HumaLOG KwikPen Sliding scale   Magnesium 400 MG Tabs One tab po qpm   oxyCODONE-acetaminophen 5-325 MG tablet Commonly known as: Percocet Take 1-2 tablets by mouth every 6 (six) hours as needed. To be taken after surgery  Ozempic (1 MG/DOSE) 4 MG/3ML Sopn Generic drug: Semaglutide (1 MG/DOSE) Inject 1 mg into the skin once a week.   pantoprazole 40 MG tablet Commonly known as: PROTONIX Take one tablet by mouth 3 days a week   temazepam 30 MG capsule Commonly known as: RESTORIL One capsules po qhs prn   Vitamin D3 1.25 MG (50000 UT) Caps Take 1 capsule by mouth every 2 wks               Durable Medical Equipment  (From admission, onward)           Start     Ordered   10/27/20 1410  DME Walker rolling  Once       Question Answer Comment  Walker: With [place] Wheels   Patient needs a walker to treat with the following condition Status post left partial knee replacement      10/27/20 1409   10/27/20 1410  DME 3 n 1  Once        10/27/20 1409   10/27/20 1410  DME Bedside commode  Once       Question:  Patient needs a bedside commode to treat with the following condition  Answer:  Status post left partial knee replacement   10/27/20 1409            Diagnostic Studies: DG Chest 2 View  Result Date: 10/23/2020 CLINICAL DATA:  Preoperative exam for left knee surgery. EXAM: CHEST - 2 VIEW COMPARISON:  Chest x-ray 09/06/2019. Right shoulder series 05/07/2019. FINDINGS: The heart size and mediastinal contours are within normal limits. Both lungs are clear. No pleural effusion or pneumothorax. Stable radiopacity noted over the right shoulder most likely loose body, similar finding noted on prior right shoulder series of 05/07/2019. No acute bony abnormality. IMPRESSION: No active cardiopulmonary disease. Electronically Signed   By: Marcello Moores  Register   On: 10/23/2020 05:39   DG Knee Left Port  Result Date: 10/27/2020 CLINICAL DATA:   Left total knee arthroplasty. EXAM: PORTABLE LEFT KNEE - 1-2 VIEW COMPARISON:  09/03/2020. FINDINGS: Interval left total knee arthroplasty. Subcutaneous/joint air and fluid are present. IMPRESSION: Left total knee arthroplasty with expected postoperative findings. Electronically Signed   By: Lorin Picket M.D.   On: 10/27/2020 12:46    Disposition: Discharge disposition: 01-Home or Self Care          Follow-up Information     [name], MD. Schedule an appointment as soon as possible for a visit in 2 week(s).   Specialty: Orthopedic Surgery Contact information: [address] [place] [address] [phone]                  Signed: Aundra Dubin 10/28/2020, 8:04 AM

## 2020-10-28 NOTE — Telephone Encounter (Signed)
Awesome thanks!

## 2020-10-28 NOTE — Progress Notes (Signed)
OT Cancellation Note  Patient Details Name: Leslie Davis MRN: [id number] DOB: Dec 11, 1955   Cancelled Treatment:    Reason Eval/Treat Not Completed: OT screened, no needs identified, will sign off Pt declines any questions/concerns about completion of ADLs/ADL transfers at home. Pt reports her husband can assist as needed at home. No formal OT eval needed at this time  Layla Maw 10/28/2020, 7:03 AM

## 2020-10-28 NOTE — Progress Notes (Signed)
Physical Therapy Treatment Patient Details Name: Leslie Davis MRN: [id number] DOB: December 30, 1955 Today's Date: 10/28/2020    History of Present Illness Pt adm 8/8 for lt TKR. PMH - DM, HTN, rheumatoid arthritis    PT Comments    Continuing work on functional mobility and activity tolerance;  Pt's pain was better managed, but weight bearing and amb were still quite painful; Able to walk a modest distance in the hallway, and initiate stair training; overall pt performed well with one rail and assist, but I bet going up backwards with husband assist will be better for pt and husband; Plan to return for another session before dc today  Follow Up Recommendations  Follow surgeon's recommendation for DC plan and follow-up therapies     Equipment Recommendations  Rolling walker with 5" wheels;3in1 (PT)    Recommendations for Other Services       Precautions / Restrictions Precautions Precautions: Knee Precaution Booklet Issued: Yes (comment) Precaution Comments: Pt educated to not allow any pillow or bolster under knee for healing with optimal range of motion.  Restrictions LLE Weight Bearing: Weight bearing as tolerated    Mobility  Bed Mobility               General bed mobility comments: in recliner upon arrival    Transfers Overall transfer level: Needs assistance Equipment used: Rolling walker (2 wheeled) Transfers: Sit to/from Stand Sit to Stand: Min guard (without phsyical contact)         General transfer comment: Good rise, good carryover for ahnd placement  Ambulation/Gait Ambulation/Gait assistance: Min guard Gait Distance (Feet): 80 Feet Assistive device: Rolling walker (2 wheeled) Gait Pattern/deviations: Decreased step length - right;Decreased stance time - left;Antalgic Gait velocity: decr   General Gait Details: Cues for sequence; no L knee buckling noted   Stairs Stairs: Yes Stairs assistance: Min assist Stair Management: One rail  Right;Alternating pattern;Forwards Number of Stairs: 4 General stair comments: Used R rail for support, and LUE support from PT; also performed a few steps with both hands on rail; Slow, and cuatious, but able to get up and down teh stairs; worth considering teaching pt and husband backwards technique next session   Wheelchair Mobility    Modified Rankin (Stroke Patients Only)       Balance Overall balance assessment: No apparent balance deficits (not formally assessed)                                          Cognition Arousal/Alertness: Awake/alert Behavior During Therapy: WFL for tasks assessed/performed Overall Cognitive Status: Within Functional Limits for tasks assessed                                        Exercises      General Comments General comments (skin integrity, edema, etc.): Reinforced knee precautions      Pertinent Vitals/Pain Pain Assessment: 0-10 Pain Score: 7  Pain Location: lt knee with amb/weight bearing Pain Descriptors / Indicators: Grimacing;Guarding;Aching Pain Intervention(s): Premedicated before session;Monitored during session    Home Living                      Prior Function            PT Goals (current goals can now be  found in the care plan section) Acute Rehab PT Goals Patient Stated Goal: decr pain PT Goal Formulation: With patient Time For Goal Achievement: 10/31/20 Potential to Achieve Goals: Good Progress towards PT goals: Progressing toward goals    Frequency    7X/week      PT Plan Current plan remains appropriate    Co-evaluation              AM-PAC PT "6 Clicks" Mobility   Outcome Measure  Help needed turning from your back to your side while in a flat bed without using bedrails?: A Little Help needed moving from lying on your back to sitting on the side of a flat bed without using bedrails?: A Little Help needed moving to and from a bed to a chair  (including a wheelchair)?: A Little Help needed standing up from a chair using your arms (e.g., wheelchair or bedside chair)?: A Little Help needed to walk in hospital room?: A Little Help needed climbing 3-5 steps with a railing? : A Little 6 Click Score: 18    End of Session Equipment Utilized During Treatment: Gait belt Activity Tolerance: Patient tolerated treatment well (though still painful with amb) Patient left: in chair;with call bell/phone within reach Nurse Communication: Mobility status PT Visit Diagnosis: Other abnormalities of gait and mobility (R26.89);Pain Pain - Right/Left: Left Pain - part of body: Knee     Time: [id number] PT Time Calculation (min) (ACUTE ONLY): 30 min  Charges:  $Gait Training: 23-37 mins $Therapeutic Activity: 8-22 mins                     Roney Marion, PT  Acute Rehabilitation Services Pager [phone] Office [phone]    Colletta [place] 10/28/2020, 12:42 PM

## 2020-10-28 NOTE — Progress Notes (Signed)
Patient was transported via wheelchair by volunteer for discharge home; in no acute distress nor complaints of pain nor discomfort; room was checked and accounted for all her belongings; discharge instructions given to patient by RN and she verbalized understanding on the instructions given.

## 2020-10-28 NOTE — Care Plan (Signed)
Ortho Bundle Case Management Note  Patient Details  Name: Leslie Davis MRN: [id number] Date of Birth: 1955-06-16  [facility] call to patient to discuss her upcoming Left total knee replacement on 10/27/20. She is an Ortho bundle through [facility] and is agreeable to case management. She lives with her husband, who will be assisting her at home after short hospital stay. She will need a CPM, FWW, 3in1/BSC for after surgery. Referral made to [facility], who will deliver to hospital prior to discharge.She will need HHPT and a referral was made to [facility] after choice provided. Reviewed all post op care instructions. Post op appointment already scheduled with Dr. Erlinda Hong on 11/11/20. Will assist with setting up her OPPT when appropriate. Will continue to follow for needs.                DME Arranged:  CPM, 3-N-1, Walker rolling DME Agency:  [employer]  HH Arranged:  PT HH Agency:  [facility]  Additional Comments: Please contact me with any questions of if this plan should need to change.  Jamse Arn, RN, BSN, [employer]  [phone] 10/28/2020, 9:42 AM

## 2020-10-28 NOTE — Progress Notes (Signed)
Subjective: 1 Day Post-Op Procedure(s) (LRB): LEFT TOTAL KNEE ARTHROPLASTY (Left) Patient reports pain as mild.  Issues with hypertension overnight. Doing better this am.  No ha, chest pain/pressure/palpitations, sob, vision changes, nausea or vomiting.  Objective: Vital signs in last 24 hours: Temp:  [96.9 F (36.1 C)-98.7 F (37.1 C)] 98.4 F (36.9 C) (08/09 0737) Pulse Rate:  [48-69] 69 (08/09 0737) Resp:  [13-25] 19 (08/09 0737) BP: (116-203)/(49-65) 156/65 (08/09 0737) SpO2:  [95 %-100 %] 98 % (08/09 0737)  Intake/Output from previous day: 08/08 0701 - 08/09 0700 In: 1260 [P.O.:460; I.V.:600; IV Piggyback:200] Out: 1175 [Urine:1125; Blood:50] Intake/Output this shift: No intake/output data recorded.  Recent Labs    10/28/20 0419  HGB 11.6*   Recent Labs    10/28/20 0419  WBC 6.3  RBC 4.09  HCT 35.0*  PLT 232   Recent Labs    10/28/20 0419  NA 134*  K 3.6  CL 102  CO2 24  BUN 9  CREATININE 1.00  GLUCOSE 91  CALCIUM 9.0   No results for input(s): LABPT, INR in the last 72 hours.  Neurologically intact Neurovascular intact Sensation intact distally Intact pulses distally Dorsiflexion/Plantar flexion intact Incision: dressing C/D/I No cellulitis present Compartment soft   Assessment/Plan: 1 Day Post-Op Procedure(s) (LRB): LEFT TOTAL KNEE ARTHROPLASTY (Left) Advance diet Up with therapy D/C IV fluids Discharge home with home health after first or second PT session (up to patient and progression with PT) WBAT LLE ABLA- mild and stable   Anticipated LOS equal to or greater than 2 midnights due to - Age 56 and older with one or more of the following:  - Obesity  - Expected need for hospital services (PT, OT, Nursing) required for safe  discharge  - Anticipated need for postoperative skilled nursing care or inpatient rehab  - Active co-morbidities: Diabetes OR   - Unanticipated findings during/Post Surgery: None  - Patient is a high risk of  re-admission due to: None   Leslie Davis 10/28/2020, 8:01 AM

## 2020-10-28 NOTE — Progress Notes (Signed)
Physical Therapy Treatment Patient Details Name: Leslie Davis MRN: [id number] DOB: Apr 01, 1955 Today's Date: 10/28/2020    History of Present Illness Pt adm 8/8 for lt TKR. PMH - DM, HTN, rheumatoid arthritis    PT Comments    Continuing work on functional mobility and activity tolerance;  Husband present for session, and provided correct guard and assist with amb and stairs; Performed ascend 4 stairs backwards with RW and husband's assist well, provided handout with step by step directions as well; Able to incr amb distance, still with slow steps, but no L knee instability noted; Unfortunately, pt became nauseated during session, and had difficulty with therex due to that nausea; educated in therex for knee flexion and extension; pt tired and nauseated, but participated; Anticipate HHPT will help reinforce exercises; confirmed with RN that pt has anti-nausea meds available to her at dc; Questions solicited and answered; discussed car transfers; OK for dc home from PT standpoint   Follow Up Recommendations  Follow surgeon's recommendation for DC plan and follow-up therapies     Equipment Recommendations  Rolling walker with 5" wheels;3in1 (PT)    Recommendations for Other Services       Precautions / Restrictions Precautions Precautions: Knee Precaution Booklet Issued: Yes (comment) Precaution Comments: Pt educated to not allow any pillow or bolster under knee for healing with optimal range of motion.  Restrictions LLE Weight Bearing: Weight bearing as tolerated    Mobility  Bed Mobility               General bed mobility comments: in recliner upon arrival; We discussed methods for getting in and out of high bed, including using a step stool, and stepping up on stool backwards    Transfers Overall transfer level: Needs assistance Equipment used: Rolling walker (2 wheeled) Transfers: Sit to/from Stand Sit to Stand: Supervision         General transfer comment:  Good rise, but slow; good carryover for ahnd placement  Ambulation/Gait Ambulation/Gait assistance: Min guard Gait Distance (Feet): 100 Feet Assistive device: Rolling walker (2 wheeled) Gait Pattern/deviations: Decreased step length - right;Decreased stance time - left;Antalgic Gait velocity: decr   General Gait Details: Nice, stable L knee in stance; Husband present and gave good guard assist   Stairs Stairs: Yes Stairs assistance: Min assist Stair Management: No rails;Backwards;With walker Number of Stairs: 4 General stair comments: Demo cues for technique; husband demostrated correct technqiue with PT as pt first, then pt and husband demonstrated correct ascend, descend 4 stairs with RW   Wheelchair Mobility    Modified Rankin (Stroke Patients Only)       Balance Overall balance assessment: No apparent balance deficits (not formally assessed)                                          Cognition Arousal/Alertness: Awake/alert Behavior During Therapy: WFL for tasks assessed/performed Overall Cognitive Status: Within Functional Limits for tasks assessed                                        Exercises Total Joint Exercises Quad Sets: AROM;Left;5 reps Short Arc QuadSinclair Ship;Left;5 reps Heel Slides: AAROM;Left;10 reps Goniometric ROM: 0-75    General Comments General comments (skin integrity, edema, etc.): Reinforced knee precautions  Pertinent Vitals/Pain Pain Assessment: 0-10 Pain Score: 7  Pain Location: lt knee with amb/weight bearing Pain Descriptors / Indicators: Grimacing;Guarding;Aching Pain Intervention(s): Premedicated before session;Monitored during session    Home Living                      Prior Function            PT Goals (current goals can now be found in the care plan section) Acute Rehab PT Goals Patient Stated Goal: decr pain PT Goal Formulation: With patient Time For Goal Achievement:  10/31/20 Potential to Achieve Goals: Good Progress towards PT goals: Progressing toward goals    Frequency    7X/week      PT Plan Current plan remains appropriate    Co-evaluation              AM-PAC PT "6 Clicks" Mobility   Outcome Measure  Help needed turning from your back to your side while in a flat bed without using bedrails?: A Little Help needed moving from lying on your back to sitting on the side of a flat bed without using bedrails?: A Little Help needed moving to and from a bed to a chair (including a wheelchair)?: A Little Help needed standing up from a chair using your arms (e.g., wheelchair or bedside chair)?: A Little Help needed to walk in hospital room?: A Little Help needed climbing 3-5 steps with a railing? : A Little 6 Click Score: 18    End of Session Equipment Utilized During Treatment: Gait belt Activity Tolerance: Patient tolerated treatment well (though still painful with amb) Patient left: in chair;with call bell/phone within reach;with family/visitor present Nurse Communication: Mobility status (and pt is nauseated) PT Visit Diagnosis: Other abnormalities of gait and mobility (R26.89);Pain Pain - Right/Left: Left Pain - part of body: Knee     Time: [id number] PT Time Calculation (min) (ACUTE ONLY): 36 min  Charges:  $Gait Training: 8-22 mins $Therapeutic Activity: 8-22 mins                     Roney Marion, PT  Acute Rehabilitation Services Pager [phone] Office [phone]    Colletta [place] 10/28/2020, 3:11 PM

## 2020-10-28 NOTE — Telephone Encounter (Signed)
Pt husband called and states that his wife is suppose to get something to help her bend her knee? She is getting out of surgery today and would like you to contact him.   CB [phone]

## 2020-10-28 NOTE — Telephone Encounter (Signed)
Yes can we make sure that she gets the CPM.  Thank you.

## 2020-10-29 ENCOUNTER — Telehealth: Payer: Self-pay

## 2020-10-29 ENCOUNTER — Telehealth: Payer: Self-pay | Admitting: Orthopaedic Surgery

## 2020-10-29 ENCOUNTER — Telehealth: Payer: Self-pay | Admitting: *Deleted

## 2020-10-29 ENCOUNTER — Other Ambulatory Visit: Payer: Self-pay

## 2020-10-29 ENCOUNTER — Encounter (HOSPITAL_COMMUNITY): Payer: Self-pay | Admitting: Orthopaedic Surgery

## 2020-10-29 ENCOUNTER — Ambulatory Visit (INDEPENDENT_AMBULATORY_CARE_PROVIDER_SITE_OTHER): Payer: Medicare Other | Admitting: Family Medicine

## 2020-10-29 DIAGNOSIS — Z96652 Presence of left artificial knee joint: Secondary | ICD-10-CM | POA: Diagnosis not present

## 2020-10-29 DIAGNOSIS — M1712 Unilateral primary osteoarthritis, left knee: Secondary | ICD-10-CM | POA: Diagnosis not present

## 2020-10-29 DIAGNOSIS — K3184 Gastroparesis: Secondary | ICD-10-CM | POA: Diagnosis not present

## 2020-10-29 DIAGNOSIS — Z794 Long term (current) use of insulin: Secondary | ICD-10-CM | POA: Diagnosis not present

## 2020-10-29 DIAGNOSIS — I10 Essential (primary) hypertension: Secondary | ICD-10-CM | POA: Diagnosis not present

## 2020-10-29 DIAGNOSIS — Z87891 Personal history of nicotine dependence: Secondary | ICD-10-CM | POA: Diagnosis not present

## 2020-10-29 DIAGNOSIS — K449 Diaphragmatic hernia without obstruction or gangrene: Secondary | ICD-10-CM | POA: Diagnosis not present

## 2020-10-29 DIAGNOSIS — M069 Rheumatoid arthritis, unspecified: Secondary | ICD-10-CM | POA: Diagnosis not present

## 2020-10-29 DIAGNOSIS — Z7982 Long term (current) use of aspirin: Secondary | ICD-10-CM | POA: Diagnosis not present

## 2020-10-29 DIAGNOSIS — E1143 Type 2 diabetes mellitus with diabetic autonomic (poly)neuropathy: Secondary | ICD-10-CM | POA: Diagnosis not present

## 2020-10-29 DIAGNOSIS — E559 Vitamin D deficiency, unspecified: Secondary | ICD-10-CM | POA: Diagnosis not present

## 2020-10-29 DIAGNOSIS — K760 Fatty (change of) liver, not elsewhere classified: Secondary | ICD-10-CM | POA: Diagnosis not present

## 2020-10-29 DIAGNOSIS — E785 Hyperlipidemia, unspecified: Secondary | ICD-10-CM | POA: Diagnosis not present

## 2020-10-29 DIAGNOSIS — K219 Gastro-esophageal reflux disease without esophagitis: Secondary | ICD-10-CM | POA: Diagnosis not present

## 2020-10-29 DIAGNOSIS — K2970 Gastritis, unspecified, without bleeding: Secondary | ICD-10-CM | POA: Diagnosis not present

## 2020-10-29 DIAGNOSIS — Z9181 History of falling: Secondary | ICD-10-CM | POA: Diagnosis not present

## 2020-10-29 DIAGNOSIS — Z471 Aftercare following joint replacement surgery: Secondary | ICD-10-CM | POA: Diagnosis not present

## 2020-10-29 MED ORDER — CEPHALEXIN 500 MG PO CAPS: ORAL_CAPSULE | ORAL | 0 refills | Status: DC

## 2020-10-29 NOTE — Chronic Care Management (AMB) (Signed)
Chronic Care Management Pharmacy Assistant   Name: Leslie Davis  MRN: [id number] DOB: 1955-08-17   Reason for Encounter: Medication Review/ Medication coordination  Recent office visits:  10-09-2020 [name], [place]. START Zithromax 250 mg Take 2 tablets (500 mg) on Day 1, followed by 1 tablet (250 mg) once daily on Days 2 through 5. START Hydromet 5-1.5 mg syrup take 5 mls every 6 hours as needed for cough.   Recent consult visits:  10-08-2020 [name], DO (Weight management). Discussed weaning off of Toujeo.  10-22-2020 [name], DO (Weight management). START [name] 145 mcg daily  10-27-2020 [name], MD [facility]). Total left knee replacement. STOP Tylenol #3, Cipro 500 mg and Advil 200 mg.  Hospital visits:  None in previous 6 months  Medications: Outpatient Encounter Medications as of 10/29/2020  Medication Sig   amLODipine (NORVASC) 10 MG tablet Take 1 tablet (10 mg total) by mouth daily.   aspirin EC 81 MG tablet Take 1 tablet (81 mg total) by mouth 2 (two) times daily. To be taken after surgery   Azilsartan Medoxomil (EDARBI) 80 MG TABS TAKE 1 TABLET EACH DAY.   benzonatate (TESSALON PERLES) 100 MG capsule Take 1 capsule (100 mg total) by mouth every 6 (six) hours as needed for cough.   Blood Glucose Monitoring Suppl (ONETOUCH VERIO) w/Device KIT Use as directed to check blood sugars 2 times per day dx: e11.65   cetirizine (ZYRTEC) 10 MG tablet Take 1 tablet (10 mg total) by mouth daily.   Cholecalciferol (VITAMIN D3) 1.25 MG (50000 UT) CAPS Take 1 capsule by mouth every 2 wks (Patient taking differently: Take 50,000 Units by mouth every 14 (fourteen) days.)   docusate sodium (COLACE) 100 MG capsule Take 1 capsule (100 mg total) by mouth daily as needed. (Patient taking differently: Take 100 mg by mouth daily as needed for mild constipation.)   ferrous gluconate (FERGON) 324 MG tablet TAKE 1 TABLET BY MOUTH TWICE DAILY WITH A MEAL.  (Patient taking differently: Take 324 mg by mouth 2 (two) times daily with a meal.)   glucose blood (ONETOUCH VERIO) test strip Use as instructed to check blood sugars 2 times per day dx:e11.65   HYDROcodone bit-homatropine (HYDROMET) 5-1.5 MG/5ML syrup Take 5 mLs by mouth every 6 (six) hours as needed for cough. (Patient not taking: No sig reported)   insulin glargine, 1 Unit Dial, (TOUJEO SOLOSTAR) 300 UNIT/ML Solostar Pen Inject 35 units subcutaneous at bedtime   insulin lispro (HUMALOG KWIKPEN) 100 UNIT/ML KwikPen Sliding scale (Patient taking differently: Inject 4-8 Units into the skin daily as needed (CBG over 150). Sliding scale)   Insulin Pen Needle 32G X 6 MM MISC Use with pen injectors daily dx code: e11.9   linaclotide ([name]) 145 MCG CAPS capsule Take 1 capsule (145 mcg total) by mouth daily.   Magnesium 400 MG TABS One tab po qpm (Patient not taking: No sig reported)   methocarbamol (ROBAXIN) 500 MG tablet Take 1 tablet (500 mg total) by mouth 2 (two) times daily as needed. To be taken after surgery   ondansetron (ZOFRAN) 4 MG tablet Take 1 tablet (4 mg total) by mouth every 8 (eight) hours as needed for nausea or vomiting. To be taken after surgery   OneTouch Delica Lancets 96P MISC Use as directed to check blood sugars 2 times per day dx:e11.65   oxyCODONE-acetaminophen (PERCOCET) 5-325 MG tablet Take 1-2 tablets by mouth every 6 (six) hours as needed. To  be taken after surgery (Patient taking differently: Take 1-2 tablets by mouth every 6 (six) hours as needed for moderate pain. To be taken after surgery)   pantoprazole (PROTONIX) 40 MG tablet Take one tablet by mouth 3 days a week (Patient taking differently: Take 40 mg by mouth 3 (three) times a week.)   rosuvastatin (CRESTOR) 20 MG tablet Take 1 tablet (20 mg total) by mouth daily.   Semaglutide, 1 MG/DOSE, (OZEMPIC, 1 MG/DOSE,) 4 MG/3ML SOPN Inject 1 mg into the skin once a week. (Patient taking differently: Inject 1 mg into the  skin once a week. Tuesday)   sulfamethoxazole-trimethoprim (BACTRIM DS) 800-160 MG tablet Take 1 tablet by mouth 2 (two) times daily. To be taken after surgery   temazepam (RESTORIL) 30 MG capsule One capsules po qhs prn (Patient taking differently: Take 30 mg by mouth at bedtime as needed for sleep.)   No facility-administered encounter medications on file as of 10/29/2020.   Reviewed chart for medication changes ahead of medication coordination call.  No OVs, Consults, or hospital visits since last care coordination call/Pharmacist visit. (If appropriate, list visit date, provider name)  No medication changes indicated OR if recent visit, treatment plan here.  BP Readings from Last 3 Encounters:  10/28/20 132/89  10/22/20 (!) 165/62  10/22/20 (!) 156/84    Lab Results  Component Value Date   HGBA1C 6.3 (H) 10/22/2020     Patient obtains medications through Vials  90 Days   Last adherence delivery included:  Toujeo 35 units at bedtime Temazepam 30 mg daily Acetaminophen-Codeine 300-30 mg every 6 hours as needed for moderate pain. Derma smooth oil Test strips    Patient declined (meds) last month: Ozempic 2/1.5 ml- Inject 0.25 mg sq once a week Toujeo -50 units sq nightly Trueplus Pen 31g 64m- Use with pen to inject insulin daily. Rosuvastatin 20 mg- 1 tablet daily ASA 81 mg- 1 tablet daily Edarbi 80 mg- 1 tablet daily  Amlodipine 10 mg -1 tablet daily Protonix 40 mg- 1 tablet 3 times weekly Do to receiving a 90 day supply on 09-07-2020  Patient is due for next adherence delivery on: 11-06-2020  Called patient and reviewed medications and coordinated delivery.  This delivery to include: Cetirizine 10 mg Protonix 40 mg Ferrous Gluconate  No acute/short fill needed  Patient declined the following medications: [name] 145 mcg- Due to having a 90 DS still  Patient needs refills for: None  Confirmed delivery date of 11-06-2020 advised patient that pharmacy will  contact them the morning of delivery.   Care Gaps: PCV13 overdue Moderna booster overdue Ophthalmology exam overdue RAF= 0.755% Medicare wellness 11-27-2020  Star Rating Drugs: Rosuvastatin 20 MG- Last filled 09-04-2020 90 DS [facility] Ozempic 1 MG- Last filled 09-02-2020 84 DS [facility] Azilsartan 80 MG- Last filled 09-04-2020 90 DS [facility]   MMayhillClinical Pharmacist Assistant [phone]

## 2020-10-29 NOTE — Progress Notes (Signed)
Rx sent into pharm Cephalexin 500 mg QID x 10 days

## 2020-10-29 NOTE — Telephone Encounter (Signed)
Transition Care Management Unsuccessful Follow-up Telephone Call  Date of discharge and from where:  10/28/2020 [facility]   Attempts:  2nd Attempt  Reason for unsuccessful TCM follow-up call:  Left voice message

## 2020-10-29 NOTE — Telephone Encounter (Signed)
Ortho bundle D/C call to patient. No answer. Left VM. Did update on VM that Dr. Erlinda Hong was going to stop her post-surgery Antibiotic and switch to another one. New one just needs to be sent to pharmacy.

## 2020-10-29 NOTE — Telephone Encounter (Signed)
Ok.  Please send in cephalexin 500 mg QID x 10 days instead.  Please let patient know to start on this asap.  Thanks.

## 2020-10-29 NOTE — Telephone Encounter (Signed)
Kate white from [facility] called about a medication interaction with backtrum and takes edarbi.  CB [phone]

## 2020-10-29 NOTE — Telephone Encounter (Signed)
Called patient no answer.

## 2020-10-29 NOTE — Telephone Encounter (Signed)
Rx sent into Pharm

## 2020-10-29 NOTE — Telephone Encounter (Signed)
Transition Care Management Unsuccessful Follow-up Telephone Call  Date of discharge and from where:  10/28/2020 [facility]   Attempts:  1st Attempt  Reason for unsuccessful TCM follow-up call:  Left voice message

## 2020-10-30 ENCOUNTER — Telehealth: Payer: Self-pay | Admitting: *Deleted

## 2020-10-30 ENCOUNTER — Other Ambulatory Visit: Payer: Self-pay | Admitting: *Deleted

## 2020-10-30 ENCOUNTER — Telehealth: Payer: Self-pay

## 2020-10-30 DIAGNOSIS — Z794 Long term (current) use of insulin: Secondary | ICD-10-CM | POA: Diagnosis not present

## 2020-10-30 DIAGNOSIS — E559 Vitamin D deficiency, unspecified: Secondary | ICD-10-CM | POA: Diagnosis not present

## 2020-10-30 DIAGNOSIS — K449 Diaphragmatic hernia without obstruction or gangrene: Secondary | ICD-10-CM | POA: Diagnosis not present

## 2020-10-30 DIAGNOSIS — Z96652 Presence of left artificial knee joint: Secondary | ICD-10-CM | POA: Diagnosis not present

## 2020-10-30 DIAGNOSIS — M069 Rheumatoid arthritis, unspecified: Secondary | ICD-10-CM | POA: Diagnosis not present

## 2020-10-30 DIAGNOSIS — E785 Hyperlipidemia, unspecified: Secondary | ICD-10-CM | POA: Diagnosis not present

## 2020-10-30 DIAGNOSIS — K219 Gastro-esophageal reflux disease without esophagitis: Secondary | ICD-10-CM | POA: Diagnosis not present

## 2020-10-30 DIAGNOSIS — Z9181 History of falling: Secondary | ICD-10-CM | POA: Diagnosis not present

## 2020-10-30 DIAGNOSIS — K2970 Gastritis, unspecified, without bleeding: Secondary | ICD-10-CM | POA: Diagnosis not present

## 2020-10-30 DIAGNOSIS — K760 Fatty (change of) liver, not elsewhere classified: Secondary | ICD-10-CM | POA: Diagnosis not present

## 2020-10-30 DIAGNOSIS — E1143 Type 2 diabetes mellitus with diabetic autonomic (poly)neuropathy: Secondary | ICD-10-CM | POA: Diagnosis not present

## 2020-10-30 DIAGNOSIS — K3184 Gastroparesis: Secondary | ICD-10-CM | POA: Diagnosis not present

## 2020-10-30 DIAGNOSIS — I10 Essential (primary) hypertension: Secondary | ICD-10-CM | POA: Diagnosis not present

## 2020-10-30 DIAGNOSIS — Z87891 Personal history of nicotine dependence: Secondary | ICD-10-CM | POA: Diagnosis not present

## 2020-10-30 DIAGNOSIS — M1712 Unilateral primary osteoarthritis, left knee: Secondary | ICD-10-CM

## 2020-10-30 DIAGNOSIS — Z471 Aftercare following joint replacement surgery: Secondary | ICD-10-CM | POA: Diagnosis not present

## 2020-10-30 DIAGNOSIS — Z7982 Long term (current) use of aspirin: Secondary | ICD-10-CM | POA: Diagnosis not present

## 2020-10-30 NOTE — Telephone Encounter (Signed)
Ortho bundle D/C call completed. 

## 2020-10-30 NOTE — Care Plan (Signed)
Ortho bundle D/C call attempted again today. Patient had Left TKR on 10/27/20 and D/C home on 10/28/20. Patient was called today and reports she is doing "OK". HHPT has been out to her home yesterday and she feels she is doing well. Discussed change of Antibiotic per Dr. Erlinda Hong for after surgery. She verbalized understanding to stop Bactrim and start Keflex.

## 2020-10-31 DIAGNOSIS — K219 Gastro-esophageal reflux disease without esophagitis: Secondary | ICD-10-CM | POA: Diagnosis not present

## 2020-10-31 DIAGNOSIS — Z471 Aftercare following joint replacement surgery: Secondary | ICD-10-CM | POA: Diagnosis not present

## 2020-10-31 DIAGNOSIS — E1143 Type 2 diabetes mellitus with diabetic autonomic (poly)neuropathy: Secondary | ICD-10-CM | POA: Diagnosis not present

## 2020-10-31 DIAGNOSIS — Z96652 Presence of left artificial knee joint: Secondary | ICD-10-CM | POA: Diagnosis not present

## 2020-10-31 DIAGNOSIS — K760 Fatty (change of) liver, not elsewhere classified: Secondary | ICD-10-CM | POA: Diagnosis not present

## 2020-10-31 DIAGNOSIS — Z87891 Personal history of nicotine dependence: Secondary | ICD-10-CM | POA: Diagnosis not present

## 2020-10-31 DIAGNOSIS — Z9181 History of falling: Secondary | ICD-10-CM | POA: Diagnosis not present

## 2020-10-31 DIAGNOSIS — K3184 Gastroparesis: Secondary | ICD-10-CM | POA: Diagnosis not present

## 2020-10-31 DIAGNOSIS — I10 Essential (primary) hypertension: Secondary | ICD-10-CM | POA: Diagnosis not present

## 2020-10-31 DIAGNOSIS — E559 Vitamin D deficiency, unspecified: Secondary | ICD-10-CM | POA: Diagnosis not present

## 2020-10-31 DIAGNOSIS — E785 Hyperlipidemia, unspecified: Secondary | ICD-10-CM | POA: Diagnosis not present

## 2020-10-31 DIAGNOSIS — Z794 Long term (current) use of insulin: Secondary | ICD-10-CM | POA: Diagnosis not present

## 2020-10-31 DIAGNOSIS — K449 Diaphragmatic hernia without obstruction or gangrene: Secondary | ICD-10-CM | POA: Diagnosis not present

## 2020-10-31 DIAGNOSIS — M069 Rheumatoid arthritis, unspecified: Secondary | ICD-10-CM | POA: Diagnosis not present

## 2020-10-31 DIAGNOSIS — Z7982 Long term (current) use of aspirin: Secondary | ICD-10-CM | POA: Diagnosis not present

## 2020-10-31 DIAGNOSIS — K2970 Gastritis, unspecified, without bleeding: Secondary | ICD-10-CM | POA: Diagnosis not present

## 2020-11-03 ENCOUNTER — Telehealth: Payer: Self-pay | Admitting: Orthopaedic Surgery

## 2020-11-03 ENCOUNTER — Telehealth: Payer: Self-pay | Admitting: *Deleted

## 2020-11-03 DIAGNOSIS — Z96652 Presence of left artificial knee joint: Secondary | ICD-10-CM | POA: Diagnosis not present

## 2020-11-03 DIAGNOSIS — M069 Rheumatoid arthritis, unspecified: Secondary | ICD-10-CM | POA: Diagnosis not present

## 2020-11-03 DIAGNOSIS — K2970 Gastritis, unspecified, without bleeding: Secondary | ICD-10-CM | POA: Diagnosis not present

## 2020-11-03 DIAGNOSIS — K449 Diaphragmatic hernia without obstruction or gangrene: Secondary | ICD-10-CM | POA: Diagnosis not present

## 2020-11-03 DIAGNOSIS — E785 Hyperlipidemia, unspecified: Secondary | ICD-10-CM | POA: Diagnosis not present

## 2020-11-03 DIAGNOSIS — Z7982 Long term (current) use of aspirin: Secondary | ICD-10-CM | POA: Diagnosis not present

## 2020-11-03 DIAGNOSIS — E559 Vitamin D deficiency, unspecified: Secondary | ICD-10-CM | POA: Diagnosis not present

## 2020-11-03 DIAGNOSIS — K760 Fatty (change of) liver, not elsewhere classified: Secondary | ICD-10-CM | POA: Diagnosis not present

## 2020-11-03 DIAGNOSIS — Z9181 History of falling: Secondary | ICD-10-CM | POA: Diagnosis not present

## 2020-11-03 DIAGNOSIS — K3184 Gastroparesis: Secondary | ICD-10-CM | POA: Diagnosis not present

## 2020-11-03 DIAGNOSIS — Z471 Aftercare following joint replacement surgery: Secondary | ICD-10-CM | POA: Diagnosis not present

## 2020-11-03 DIAGNOSIS — K219 Gastro-esophageal reflux disease without esophagitis: Secondary | ICD-10-CM | POA: Diagnosis not present

## 2020-11-03 DIAGNOSIS — Z794 Long term (current) use of insulin: Secondary | ICD-10-CM | POA: Diagnosis not present

## 2020-11-03 DIAGNOSIS — E1143 Type 2 diabetes mellitus with diabetic autonomic (poly)neuropathy: Secondary | ICD-10-CM | POA: Diagnosis not present

## 2020-11-03 DIAGNOSIS — Z87891 Personal history of nicotine dependence: Secondary | ICD-10-CM | POA: Diagnosis not present

## 2020-11-03 DIAGNOSIS — I10 Essential (primary) hypertension: Secondary | ICD-10-CM | POA: Diagnosis not present

## 2020-11-03 MED ORDER — OXYCODONE-ACETAMINOPHEN 5-325 MG PO TABS: 1.0000 | ORAL_TABLET | Freq: Two times a day (BID) | ORAL | 0 refills | Status: DC | PRN

## 2020-11-03 NOTE — Telephone Encounter (Signed)
Patient called requesting refill of pain medication. She is 1 week post-op and doing well. Thanks.

## 2020-11-03 NOTE — Telephone Encounter (Signed)
I refilled oxy

## 2020-11-03 NOTE — Telephone Encounter (Signed)
[facility] called and states pt BP is 162/60.  Ebony Hail CB [phone]

## 2020-11-04 ENCOUNTER — Telehealth (INDEPENDENT_AMBULATORY_CARE_PROVIDER_SITE_OTHER): Payer: Self-pay | Admitting: Family Medicine

## 2020-11-04 ENCOUNTER — Telehealth: Payer: Self-pay

## 2020-11-04 MED ORDER — METHOCARBAMOL 500 MG PO TABS: 500.0000 mg | ORAL_TABLET | Freq: Two times a day (BID) | ORAL | 0 refills | Status: DC | PRN

## 2020-11-04 NOTE — Telephone Encounter (Signed)
[facility] called back and stated that they are working on reconciling all of Leslie Davis's medications prior to prepackaging everything for the next month, because they package everything by timing for Leslie Davis into one package for easier medication administrtion and easier compliance to medication regimen. The only prescription they are still waiting on is one for [name].  The next fill for Leslie Davis's monthly medications is on 11/20/20. [facility]ia was advised that Leslie Davis has an existing appt on 11/12/20, and the medication could be refilled at this time. She did state that this should give them plenty of time to process the Rx and package the medication with the rest of Leslie Davis's medications to be sent out to her for the month.

## 2020-11-04 NOTE — Telephone Encounter (Signed)
Called patient. Went to VM. LMOM. Rx sent to pharm.

## 2020-11-04 NOTE — Telephone Encounter (Signed)
Patient would like a RF on Methocarbamol sent to [facility]?

## 2020-11-04 NOTE — Telephone Encounter (Signed)
[facility] called regarding patient's [name] prescription. Pharmacy number is [phone].

## 2020-11-04 NOTE — Addendum Note (Signed)
Addended by: Hortencia Pilar on: 11/04/2020 01:46 PM   Modules accepted: Orders

## 2020-11-04 NOTE — Telephone Encounter (Signed)
Contacted pt's pharmacy and was placed on hold for several minutes and then transferred to a voicemail. Voicemail was left and requested a call back.

## 2020-11-05 DIAGNOSIS — Z96652 Presence of left artificial knee joint: Secondary | ICD-10-CM | POA: Diagnosis not present

## 2020-11-05 DIAGNOSIS — E1143 Type 2 diabetes mellitus with diabetic autonomic (poly)neuropathy: Secondary | ICD-10-CM | POA: Diagnosis not present

## 2020-11-05 DIAGNOSIS — K219 Gastro-esophageal reflux disease without esophagitis: Secondary | ICD-10-CM | POA: Diagnosis not present

## 2020-11-05 DIAGNOSIS — M069 Rheumatoid arthritis, unspecified: Secondary | ICD-10-CM | POA: Diagnosis not present

## 2020-11-05 DIAGNOSIS — Z9181 History of falling: Secondary | ICD-10-CM | POA: Diagnosis not present

## 2020-11-05 DIAGNOSIS — Z794 Long term (current) use of insulin: Secondary | ICD-10-CM | POA: Diagnosis not present

## 2020-11-05 DIAGNOSIS — K760 Fatty (change of) liver, not elsewhere classified: Secondary | ICD-10-CM | POA: Diagnosis not present

## 2020-11-05 DIAGNOSIS — Z87891 Personal history of nicotine dependence: Secondary | ICD-10-CM | POA: Diagnosis not present

## 2020-11-05 DIAGNOSIS — E785 Hyperlipidemia, unspecified: Secondary | ICD-10-CM | POA: Diagnosis not present

## 2020-11-05 DIAGNOSIS — Z471 Aftercare following joint replacement surgery: Secondary | ICD-10-CM | POA: Diagnosis not present

## 2020-11-05 DIAGNOSIS — E559 Vitamin D deficiency, unspecified: Secondary | ICD-10-CM | POA: Diagnosis not present

## 2020-11-05 DIAGNOSIS — I10 Essential (primary) hypertension: Secondary | ICD-10-CM | POA: Diagnosis not present

## 2020-11-05 DIAGNOSIS — K2970 Gastritis, unspecified, without bleeding: Secondary | ICD-10-CM | POA: Diagnosis not present

## 2020-11-05 DIAGNOSIS — Z7982 Long term (current) use of aspirin: Secondary | ICD-10-CM | POA: Diagnosis not present

## 2020-11-05 DIAGNOSIS — K3184 Gastroparesis: Secondary | ICD-10-CM | POA: Diagnosis not present

## 2020-11-05 DIAGNOSIS — K449 Diaphragmatic hernia without obstruction or gangrene: Secondary | ICD-10-CM | POA: Diagnosis not present

## 2020-11-07 DIAGNOSIS — K449 Diaphragmatic hernia without obstruction or gangrene: Secondary | ICD-10-CM | POA: Diagnosis not present

## 2020-11-07 DIAGNOSIS — K219 Gastro-esophageal reflux disease without esophagitis: Secondary | ICD-10-CM | POA: Diagnosis not present

## 2020-11-07 DIAGNOSIS — Z471 Aftercare following joint replacement surgery: Secondary | ICD-10-CM | POA: Diagnosis not present

## 2020-11-07 DIAGNOSIS — Z7982 Long term (current) use of aspirin: Secondary | ICD-10-CM | POA: Diagnosis not present

## 2020-11-07 DIAGNOSIS — E1143 Type 2 diabetes mellitus with diabetic autonomic (poly)neuropathy: Secondary | ICD-10-CM | POA: Diagnosis not present

## 2020-11-07 DIAGNOSIS — Z9181 History of falling: Secondary | ICD-10-CM | POA: Diagnosis not present

## 2020-11-07 DIAGNOSIS — K3184 Gastroparesis: Secondary | ICD-10-CM | POA: Diagnosis not present

## 2020-11-07 DIAGNOSIS — M069 Rheumatoid arthritis, unspecified: Secondary | ICD-10-CM | POA: Diagnosis not present

## 2020-11-07 DIAGNOSIS — K2970 Gastritis, unspecified, without bleeding: Secondary | ICD-10-CM | POA: Diagnosis not present

## 2020-11-07 DIAGNOSIS — K760 Fatty (change of) liver, not elsewhere classified: Secondary | ICD-10-CM | POA: Diagnosis not present

## 2020-11-07 DIAGNOSIS — Z87891 Personal history of nicotine dependence: Secondary | ICD-10-CM | POA: Diagnosis not present

## 2020-11-07 DIAGNOSIS — Z794 Long term (current) use of insulin: Secondary | ICD-10-CM | POA: Diagnosis not present

## 2020-11-07 DIAGNOSIS — E785 Hyperlipidemia, unspecified: Secondary | ICD-10-CM | POA: Diagnosis not present

## 2020-11-07 DIAGNOSIS — I10 Essential (primary) hypertension: Secondary | ICD-10-CM | POA: Diagnosis not present

## 2020-11-07 DIAGNOSIS — E559 Vitamin D deficiency, unspecified: Secondary | ICD-10-CM | POA: Diagnosis not present

## 2020-11-07 DIAGNOSIS — Z96652 Presence of left artificial knee joint: Secondary | ICD-10-CM | POA: Diagnosis not present

## 2020-11-10 DIAGNOSIS — Z96652 Presence of left artificial knee joint: Secondary | ICD-10-CM | POA: Diagnosis not present

## 2020-11-10 DIAGNOSIS — E1143 Type 2 diabetes mellitus with diabetic autonomic (poly)neuropathy: Secondary | ICD-10-CM | POA: Diagnosis not present

## 2020-11-10 DIAGNOSIS — M069 Rheumatoid arthritis, unspecified: Secondary | ICD-10-CM | POA: Diagnosis not present

## 2020-11-10 DIAGNOSIS — Z87891 Personal history of nicotine dependence: Secondary | ICD-10-CM | POA: Diagnosis not present

## 2020-11-10 DIAGNOSIS — K3184 Gastroparesis: Secondary | ICD-10-CM | POA: Diagnosis not present

## 2020-11-10 DIAGNOSIS — K219 Gastro-esophageal reflux disease without esophagitis: Secondary | ICD-10-CM | POA: Diagnosis not present

## 2020-11-10 DIAGNOSIS — Z7982 Long term (current) use of aspirin: Secondary | ICD-10-CM | POA: Diagnosis not present

## 2020-11-10 DIAGNOSIS — Z471 Aftercare following joint replacement surgery: Secondary | ICD-10-CM | POA: Diagnosis not present

## 2020-11-10 DIAGNOSIS — I10 Essential (primary) hypertension: Secondary | ICD-10-CM | POA: Diagnosis not present

## 2020-11-10 DIAGNOSIS — E785 Hyperlipidemia, unspecified: Secondary | ICD-10-CM | POA: Diagnosis not present

## 2020-11-10 DIAGNOSIS — Z794 Long term (current) use of insulin: Secondary | ICD-10-CM | POA: Diagnosis not present

## 2020-11-10 DIAGNOSIS — K2970 Gastritis, unspecified, without bleeding: Secondary | ICD-10-CM | POA: Diagnosis not present

## 2020-11-10 DIAGNOSIS — Z9181 History of falling: Secondary | ICD-10-CM | POA: Diagnosis not present

## 2020-11-10 DIAGNOSIS — E559 Vitamin D deficiency, unspecified: Secondary | ICD-10-CM | POA: Diagnosis not present

## 2020-11-10 DIAGNOSIS — K760 Fatty (change of) liver, not elsewhere classified: Secondary | ICD-10-CM | POA: Diagnosis not present

## 2020-11-10 DIAGNOSIS — K449 Diaphragmatic hernia without obstruction or gangrene: Secondary | ICD-10-CM | POA: Diagnosis not present

## 2020-11-11 ENCOUNTER — Other Ambulatory Visit: Payer: Self-pay

## 2020-11-11 ENCOUNTER — Other Ambulatory Visit: Payer: Self-pay | Admitting: Orthopaedic Surgery

## 2020-11-11 ENCOUNTER — Other Ambulatory Visit: Payer: Self-pay | Admitting: Physician Assistant

## 2020-11-11 ENCOUNTER — Telehealth: Payer: Self-pay | Admitting: *Deleted

## 2020-11-11 ENCOUNTER — Other Ambulatory Visit (INDEPENDENT_AMBULATORY_CARE_PROVIDER_SITE_OTHER): Payer: Self-pay | Admitting: Family Medicine

## 2020-11-11 ENCOUNTER — Ambulatory Visit (INDEPENDENT_AMBULATORY_CARE_PROVIDER_SITE_OTHER): Payer: Medicare Other | Admitting: Physician Assistant

## 2020-11-11 DIAGNOSIS — K5900 Constipation, unspecified: Secondary | ICD-10-CM

## 2020-11-11 DIAGNOSIS — Z96652 Presence of left artificial knee joint: Secondary | ICD-10-CM

## 2020-11-11 MED ORDER — OXYCODONE-ACETAMINOPHEN 5-325 MG PO TABS: 1.0000 | ORAL_TABLET | Freq: Two times a day (BID) | ORAL | 0 refills | Status: DC | PRN

## 2020-11-11 NOTE — Telephone Encounter (Signed)
I'm not sure how to do that?

## 2020-11-11 NOTE — Telephone Encounter (Signed)
Can you sign off on your end. I cant sign off.

## 2020-11-11 NOTE — Telephone Encounter (Signed)
Rx sent in to pharm 

## 2020-11-11 NOTE — Addendum Note (Signed)
Addended by: Aundra Dubin on: 11/11/2020 02:08 PM   Modules accepted: Orders

## 2020-11-11 NOTE — Telephone Encounter (Signed)
14 day Ortho bundle in office meeting completed.

## 2020-11-11 NOTE — Telephone Encounter (Signed)
Sent in

## 2020-11-11 NOTE — Progress Notes (Signed)
Post-Op Visit Note   Patient: Leslie Davis           Date of Birth: Jul 25, 1955           MRN: [id number] Visit Date: 11/11/2020 PCP: [name], MD   Assessment & Plan:  Chief Complaint: No chief complaint on file.  Visit Diagnoses:  1. Hx of total knee replacement, left     Plan: Patient is a pleasant 65 year old female who comes in today 2 weeks status post left total knee replacement, date of surgery 10/27/2020.  She has been doing okay.  She has been working with home health physical therapy.  She is ambulating with a walker.  She is taking oxycodone for pain for which I recently refilled.  She is taking aspirin for DVT prophylaxis.  Examination of the left knee reveals a well-healing surgical incision with nylon sutures in place.  No evidence of infection or cellulitis.  Calf is soft and nontender.  She is neurovascular intact distally.  Today, sutures were removed and Steri-Strips applied.  Outpatient referral to physical therapy has been made.  She will follow-up with [place] in 4 weeks time for repeat evaluation and 2 view x-rays of the left knee.  Call with concerns or questions.  Follow-Up Instructions: Return in about 4 weeks (around 12/09/2020).   Orders:  No orders of the defined types were placed in this encounter.  No orders of the defined types were placed in this encounter.   Imaging: No new imaging  PMFS History: Patient Active Problem List   Diagnosis Date Noted   Status post total left knee replacement 10/27/2020   Primary osteoarthritis of left knee    NAFLD (nonalcoholic fatty liver disease) 08/06/2020   Other fatigue 07/23/2020   SOBOE (shortness of breath on exertion) 07/23/2020   Diabetes mellitus ([place]) 07/23/2020   Hypertension associated with type 2 diabetes mellitus ([place]) 07/23/2020   Hyperlipidemia associated with type 2 diabetes mellitus ([place]) 07/23/2020   Absolute anemia 07/23/2020   Vitamin D deficiency 07/23/2020   At risk for heart  disease 07/23/2020   AKI (acute kidney injury) ([place]) 09/07/2019   Acute kidney injury ([place]) 09/06/2019   Bradycardia 09/06/2019   Hyperglycemia 09/06/2019   HLD (hyperlipidemia)    Hypertensive nephropathy 05/16/2019   Erosive gastritis 11/16/2017   Fatty liver 11/16/2017   Abdominal pain 12/11/2012   Early satiety 12/11/2012   Hiatal hernia    Hx of cholecystectomy 02/05/2011   Flu-like symptoms 02/05/2011   CONSTIPATION 08/27/2008   FATTY LIVER DISEASE 05/07/2008   Nausea with vomiting 04/23/2008   EPIGASTRIC PAIN 04/23/2008   RENAL CALCULUS 04/22/2008   Type 2 diabetes mellitus with stage 2 chronic kidney disease, with long-term current use of insulin ([place]) 05/19/2006   Type 2 diabetes mellitus with hyperlipidemia ([place]) 05/19/2006   HYPERTENSION, BENIGN SYSTEMIC 05/19/2006   GASTROESOPHAGEAL REFLUX, NO ESOPHAGITIS 05/19/2006   AMENORRHEA 05/19/2006   Past Medical History:  Diagnosis Date   DM (diabetes mellitus) ([place])    Fatty liver disease, nonalcoholic    Gastritis    Gastroparesis    GERD (gastroesophageal reflux disease)    Heartburn    Hiatal hernia    HLD (hyperlipidemia)    HTN (hypertension)    Joint pain    Renal calculus    Rheumatoid arthritis ([place])    Vitamin D deficiency     Family History  Problem Relation Age of Onset   Diabetes Mother    Thyroid disease Mother  Kidney disease Mother    Diabetes Maternal Grandmother    Stomach cancer Maternal Aunt        X 2 aunts   Hypertension Father    Hyperlipidemia Father    Sudden death Father    Stroke Father    Colon cancer Neg Hx    Esophageal cancer Neg Hx    Rectal cancer Neg Hx     Past Surgical History:  Procedure Laterality Date   CHOLECYSTECTOMY     COLONOSCOPY     TOTAL KNEE ARTHROPLASTY Left 10/27/2020   Procedure: LEFT TOTAL KNEE ARTHROPLASTY;  Surgeon: [name], MD;  Location: [facility];  Service: Orthopedics;  Laterality: Left;   TUBAL LIGATION     UPPER GASTROINTESTINAL ENDOSCOPY      Social History   Occupational History   Occupation: Housekeeping    Comment: Estate manager/land agent  Tobacco Use   Smoking status: Former    Packs/day: 0.25    Years: 1.00    Pack years: 0.25    Types: Cigarettes    Quit date: 09/10/1974    Years since quitting: 46.2   Smokeless tobacco: Never   Tobacco comments:    she no longer smokes.   Vaping Use   Vaping Use: Never used  Substance and Sexual Activity   Alcohol use: No   Drug use: No   Sexual activity: Not Currently

## 2020-11-12 ENCOUNTER — Ambulatory Visit (INDEPENDENT_AMBULATORY_CARE_PROVIDER_SITE_OTHER): Payer: Medicare Other | Admitting: Physical Therapy

## 2020-11-12 ENCOUNTER — Encounter: Payer: Self-pay | Admitting: Physical Therapy

## 2020-11-12 ENCOUNTER — Ambulatory Visit (INDEPENDENT_AMBULATORY_CARE_PROVIDER_SITE_OTHER): Payer: Medicare Other | Admitting: Family Medicine

## 2020-11-12 DIAGNOSIS — G8929 Other chronic pain: Secondary | ICD-10-CM | POA: Diagnosis not present

## 2020-11-12 DIAGNOSIS — R2689 Other abnormalities of gait and mobility: Secondary | ICD-10-CM | POA: Diagnosis not present

## 2020-11-12 DIAGNOSIS — M25662 Stiffness of left knee, not elsewhere classified: Secondary | ICD-10-CM | POA: Diagnosis not present

## 2020-11-12 DIAGNOSIS — M6281 Muscle weakness (generalized): Secondary | ICD-10-CM

## 2020-11-12 DIAGNOSIS — M25562 Pain in left knee: Secondary | ICD-10-CM | POA: Diagnosis not present

## 2020-11-12 DIAGNOSIS — R600 Localized edema: Secondary | ICD-10-CM

## 2020-11-12 NOTE — Patient Instructions (Signed)
Access Code: [email or web address] URL: [email or web address] Date: 11/12/2020 Prepared by: Almyra Free  Exercises Supine Quad Set - 3 x daily - 7 x weekly - 1 sets - 10 reps - 10 hold Supine Heel Slide with Strap - 3 x daily - 7 x weekly - 1 sets - 10 reps - 10 hold Supine Short Arc Quad - 3 x daily - 7 x weekly - 1 sets - 10 reps Seated Knee Flexion Extension AROM - 3 x daily - 7 x weekly - 1 sets - 10 reps Seated Long Arc Quad - 3 x daily - 7 x weekly - 1 sets - 10 reps Standing Hip Abduction with Counter Support - 1 x daily - 7 x weekly - 3 sets - 10 reps Standing Hip Extension with Counter Support - 1 x daily - 7 x weekly - 3 sets - 10 reps Standing Knee Flexion with Counter Support - 1 x daily - 7 x weekly - 3 sets - 10 reps

## 2020-11-12 NOTE — Therapy (Signed)
[facility] [address] [place], [place], [address] Phone: [phone]   Fax:  [phone]  Physical Therapy Evaluation  Patient Details  Name: Leslie Davis MRN: [id number] Date of Birth: [birth year] Referring Provider (PT): Frankey Shown MD   Encounter Date: 11/12/2020   PT End of Session - 11/12/20 1100     Visit Number 1    Number of Visits 16    Date for PT Re-Evaluation 01/07/21    Authorization Type UHC MCR    Progress Note Due on Visit 10    PT Start Time 1100    PT Stop Time [id number]    PT Time Calculation (min) 48 min    Activity Tolerance Patient tolerated treatment well;Patient limited by pain    Behavior During Therapy [facility] for tasks assessed/performed             Past Medical History:  Diagnosis Date   DM (diabetes mellitus) ([place])    Fatty liver disease, nonalcoholic    Gastritis    Gastroparesis    GERD (gastroesophageal reflux disease)    Heartburn    Hiatal hernia    HLD (hyperlipidemia)    HTN (hypertension)    Joint pain    Renal calculus    Rheumatoid arthritis ([place])    Vitamin D deficiency     Past Surgical History:  Procedure Laterality Date   CHOLECYSTECTOMY     COLONOSCOPY     TOTAL KNEE ARTHROPLASTY Left 10/27/2020   Procedure: LEFT TOTAL KNEE ARTHROPLASTY;  Surgeon: [name], MD;  Location: [place];  Service: Orthopedics;  Laterality: Left;   TUBAL LIGATION     UPPER GASTROINTESTINAL ENDOSCOPY      There were no vitals filed for this visit.    Subjective Assessment - 11/12/20 1105     Subjective Patient with left TKR on 10/28/20. I'm doing better. Able to get in and out of bed by myself. Pain is 7/10 today. Still using CPM 2x/day    Pertinent History DM, HTN    How long can you stand comfortably? 20-25 min    How long can you walk comfortably? 30 min    Patient Stated Goals Get back to my old self; no walker    Currently in Pain? Yes    Pain Score 7     Pain Location Knee    Pain  Orientation Left    Pain Descriptors / Indicators Aching    Pain Type Surgical pain    Pain Onset 1 to 4 weeks ago    Pain Frequency Constant    Aggravating Factors  sitting for a long time    Pain Relieving Factors ice and elevation    Effect of Pain on Daily Activities unable to do normal chores                [facility] PT Assessment - 11/12/20 0001       Assessment   Medical Diagnosis s/p Lt TKR    Referring Provider (PT) Frankey Shown MD    Onset Date/Surgical Date 10/27/20    Hand Dominance Right    Next MD Visit 4 weeks    Prior Therapy yes      Precautions   Precautions None      Restrictions   Weight Bearing Restrictions No      Balance Screen   Has the patient fallen in the past 6 months No    Has the patient had a decrease in activity level  because of a fear of falling?  No    Is the patient reluctant to leave their home because of a fear of falling?  No      Home Ecologist residence    Additional Comments 3 steps to enterhas handrail on Rt side      Prior Function   Level of Independence Independent    Vocation Retired    Leisure works out at Agricultural [employer] Comments weight shifts right      ROM / Strength   AROM / PROM / Strength AROM;PROM;Strength      AROM   AROM Assessment Site Knee    Right/Left Knee Left    Left Knee Extension -5    Left Knee Flexion 75      PROM   PROM Assessment Site Knee    Right/Left Knee Left    Left Knee Extension 0    Left Knee Flexion 90      Strength   Overall Strength Comments Rt knee 4+/5; bil hip flex 4-/5, left hip ext 4/5, ABD 4-/5    Strength Assessment Site Knee    Right/Left Knee Left    Left Knee Flexion 4/5    Left Knee Extension 4/5      Palpation   Palpation comment med/lat knee      Special Tests   Other special tests unable to do SLR in supine      Transfers   Comments requires bil UE support for sit to stand; decreased left knee  flexion and decreased WB on left      Ambulation/Gait   Ambulation/Gait Yes    Ambulation/Gait Assistance 6: Modified independent (Device/Increase time)    Ambulation Distance (Feet) 20 Feet    Assistive device Rolling walker    Gait Pattern Step-through pattern;Decreased stance time - right;Decreased stance time - left;Decreased hip/knee flexion - left;Decreased weight shift to left    Ambulation Surface Level                        Objective measurements completed on examination: See above findings.               PT Education - 11/12/20 1245     Education Details HEP    Person(s) Educated Patient    Methods Explanation;Demonstration;Handout    Comprehension Verbalized understanding;Returned demonstration              PT Short Term Goals - 11/12/20 1505       PT SHORT TERM GOAL #1   Title Pt will be I and compliant with HEP.    Time 4    Period Weeks    Status New      PT SHORT TERM GOAL #2   Title Pt able to safely ambulate community distances without AD    Baseline -    Time 4    Period Weeks    Status New               PT Long Term Goals - 11/12/20 1501       PT LONG TERM GOAL #1   Title Pt will improve FOTO to >= 70    Baseline FS = 52 at intake    Time 8    Period Weeks    Status New    Target Date 01/07/21      PT LONG TERM GOAL #  2   Title Pt will improve Rt knee  AROM to 3-120 deg to normalize gait and transfers.    Time 8    Period Weeks    Status New      PT LONG TERM GOAL #3   Title Pt will improve Lt hip/knee strength to overall 4+ to improve function.    Baseline -    Time 8    Period Weeks    Status New      PT LONG TERM GOAL #4   Title Pt will report less than 3-4/10 pain with standing/walking at least 30 minutes and negotiating her 3 steps to enter her house.    Baseline -    Time 8    Period Weeks    Status New                    Plan - 11/12/20 1149     Clinical Impression  Statement Pt presents s/p left TKR on 10/27/20. She ambulates using a RW with decreased knee flex in swing and decreased stance time on Lt LE. She has limited active left knee ROM of 5-75 deg of flexion and has pain with motion. Additionally she has weakness in bil hips and knees.. She will benefit from PT to address these deficits and return her to her PLOF.    Personal Factors and Comorbidities Comorbidity 2;Past/Current Experience    Comorbidities DM, HTN    Examination-Activity Limitations Locomotion Level;Transfers    Examination-Participation Restrictions Cleaning;Other   working out at gym   Stability/Clinical Decision Making Stable/Uncomplicated    Clinical Decision Making Low    Rehab Potential Excellent    PT Frequency 2x / week    PT Duration 8 weeks    PT Treatment/Interventions ADLs/Self Care Home Management;Aquatic Therapy;Cryotherapy;Electrical Stimulation;Moist Heat;Balance training;Therapeutic exercise;Therapeutic activities;Functional mobility training;Stair training;Gait training;Patient/family education;Manual techniques;Passive range of motion;Scar mobilization;Taping;Vasopneumatic Device    PT Next Visit Plan gait, ROM, strength    PT Home Exercise Plan [id number]    Consulted and Agree with Plan of Care Patient             Patient will benefit from skilled therapeutic intervention in order to improve the following deficits and impairments:  Abnormal gait, Decreased range of motion, Pain, Impaired flexibility, Decreased balance, Decreased strength, Increased edema, Postural dysfunction  Visit Diagnosis: Stiffness of left knee, not elsewhere classified - Plan: PT plan of care cert/re-cert  Chronic pain of left knee - Plan: PT plan of care cert/re-cert  Other abnormalities of gait and mobility - Plan: PT plan of care cert/re-cert  Localized edema - Plan: PT plan of care cert/re-cert  Muscle weakness (generalized) - Plan: PT plan of care cert/re-cert     Problem  List Patient Active Problem List   Diagnosis Date Noted   Status post total left knee replacement 10/27/2020   Primary osteoarthritis of left knee    NAFLD (nonalcoholic fatty liver disease) 08/06/2020   Other fatigue 07/23/2020   SOBOE (shortness of breath on exertion) 07/23/2020   Diabetes mellitus ([place]) 07/23/2020   Hypertension associated with type 2 diabetes mellitus ([place]) 07/23/2020   Hyperlipidemia associated with type 2 diabetes mellitus ([place]) 07/23/2020   Absolute anemia 07/23/2020   Vitamin D deficiency 07/23/2020   At risk for heart disease 07/23/2020   AKI (acute kidney injury) ([place]) 09/07/2019   Acute kidney injury ([place]) 09/06/2019   Bradycardia 09/06/2019   Hyperglycemia 09/06/2019   HLD (hyperlipidemia)    Hypertensive nephropathy  05/16/2019   Erosive gastritis 11/16/2017   Fatty liver 11/16/2017   Abdominal pain 12/11/2012   Early satiety 12/11/2012   Hiatal hernia    Hx of cholecystectomy 02/05/2011   Flu-like symptoms 02/05/2011   CONSTIPATION 08/27/2008   FATTY LIVER DISEASE 05/07/2008   Nausea with vomiting 04/23/2008   EPIGASTRIC PAIN 04/23/2008   RENAL CALCULUS 04/22/2008   Type 2 diabetes mellitus with stage 2 chronic kidney disease, with long-term current use of insulin ([place]) 05/19/2006   Type 2 diabetes mellitus with hyperlipidemia ([place]) 05/19/2006   HYPERTENSION, BENIGN SYSTEMIC 05/19/2006   GASTROESOPHAGEAL REFLUX, NO ESOPHAGITIS 05/19/2006   AMENORRHEA 05/19/2006    Madelyn Flavors PT 11/12/2020, 3:13 PM  [facility] [address] [place], [place], [address] Phone: [phone]   Fax:  [phone]  Name: MELIKA MONTORO MRN: [id number] Date of Birth: [birth year]

## 2020-11-14 ENCOUNTER — Ambulatory Visit (INDEPENDENT_AMBULATORY_CARE_PROVIDER_SITE_OTHER): Payer: Medicare Other | Admitting: Rehabilitative and Restorative Service Providers"

## 2020-11-14 ENCOUNTER — Encounter: Payer: Self-pay | Admitting: Rehabilitative and Restorative Service Providers"

## 2020-11-14 ENCOUNTER — Other Ambulatory Visit: Payer: Self-pay

## 2020-11-14 DIAGNOSIS — G8929 Other chronic pain: Secondary | ICD-10-CM

## 2020-11-14 DIAGNOSIS — M25562 Pain in left knee: Secondary | ICD-10-CM

## 2020-11-14 DIAGNOSIS — R600 Localized edema: Secondary | ICD-10-CM | POA: Diagnosis not present

## 2020-11-14 DIAGNOSIS — M6281 Muscle weakness (generalized): Secondary | ICD-10-CM | POA: Diagnosis not present

## 2020-11-14 DIAGNOSIS — R2689 Other abnormalities of gait and mobility: Secondary | ICD-10-CM | POA: Diagnosis not present

## 2020-11-14 DIAGNOSIS — M25662 Stiffness of left knee, not elsewhere classified: Secondary | ICD-10-CM

## 2020-11-14 NOTE — Therapy (Signed)
[facility] [address] [place], [place], [address] Phone: [phone]   Fax:  [phone]  Physical Therapy Treatment  Patient Details  Name: Leslie Davis MRN: [id number] Date of Birth: [birth year] Referring Provider (PT): Frankey Shown MD   Encounter Date: 11/14/2020   PT End of Session - 11/14/20 1101     Visit Number 2    Number of Visits 16    Date for PT Re-Evaluation 01/07/21    Authorization Type UHC MCR    Progress Note Due on Visit 10    PT Start Time 1100    PT Stop Time 1150    PT Time Calculation (min) 50 min    Activity Tolerance Patient tolerated treatment well    Behavior During Therapy WFL for tasks assessed/performed             Past Medical History:  Diagnosis Date   DM (diabetes mellitus) ([place])    Fatty liver disease, nonalcoholic    Gastritis    Gastroparesis    GERD (gastroesophageal reflux disease)    Heartburn    Hiatal hernia    HLD (hyperlipidemia)    HTN (hypertension)    Joint pain    Renal calculus    Rheumatoid arthritis ([place])    Vitamin D deficiency     Past Surgical History:  Procedure Laterality Date   CHOLECYSTECTOMY     COLONOSCOPY     TOTAL KNEE ARTHROPLASTY Left 10/27/2020   Procedure: LEFT TOTAL KNEE ARTHROPLASTY;  Surgeon: [name], MD;  Location: [place];  Service: Orthopedics;  Laterality: Left;   TUBAL LIGATION     UPPER GASTROINTESTINAL ENDOSCOPY      There were no vitals filed for this visit.   Subjective Assessment - 11/14/20 1105     Subjective Pt. stated about a 4/10 pain today upon arrival.  Pt. stated she felt ok after last visit.    Pertinent History DM, HTN    How long can you stand comfortably? 20-25 min    How long can you walk comfortably? 30 min    Patient Stated Goals Get back to my old self; no walker    Currently in Pain? Yes    Pain Score 4     Pain Location Knee    Pain Orientation Left    Pain Descriptors / Indicators Aching;Sore;Tightness     Pain Type Surgical pain    Pain Onset 1 to 4 weeks ago    Pain Frequency Constant    Aggravating Factors  bending pain    Pain Relieving Factors avoding end range pain                [facility] PT Assessment - 11/14/20 0001       Assessment   Medical Diagnosis s/p Lt TKR    Referring Provider (PT) Frankey Shown MD    Onset Date/Surgical Date 10/27/20    Hand Dominance Right      AROM   Left Knee Extension -21   in sitting LAQ.  in supine quad set: -5   Left Knee Flexion 84   in supine heel slide                          [facility] Adult PT Treatment/Exercise - 11/14/20 0001       Ambulation/Gait   Gait Comments FWW      Exercises   Exercises Knee/Hip      Knee/Hip Exercises:  Stretches   Press photographer 30 seconds;5 reps;Both   incline board   Other Knee/Hip Stretches cues for heel prop extension stretch long duration to tolerance (performed 2 mins in clinic)      Knee/Hip Exercises: Aerobic   Nustep Lvl 5 UE/LE 8 mins      Knee/Hip Exercises: Seated   Long Arc Quad Left;2 sets;10 reps   pause in extension and flexion 1 sec c contralteral leg movement opposite     Knee/Hip Exercises: Supine   Heel Slides Left;2 sets;10 reps   2-3 sec hold (active heel slide)     Modalities   Modalities Vasopneumatic      Vasopneumatic   Number Minutes Vasopneumatic  10 minutes    Vasopnuematic Location  Knee   Lt   Vasopneumatic Pressure Medium    Vasopneumatic Temperature  34      Manual Therapy   Manual therapy comments seated Lt knee flexion c distraction/IR mobilization c movement c contralateral leg movement opposite                      PT Short Term Goals - 11/12/20 1505       PT SHORT TERM GOAL #1   Title Pt will be I and compliant with HEP.    Time 4    Period Weeks    Status New      PT SHORT TERM GOAL #2   Title Pt able to safely ambulate community distances without AD    Baseline -    Time 4    Period Weeks    Status New                PT Long Term Goals - 11/12/20 1501       PT LONG TERM GOAL #1   Title Pt will improve FOTO to >= 70    Baseline FS = 52 at intake    Time 8    Period Weeks    Status New    Target Date 01/07/21      PT LONG TERM GOAL #2   Title Pt will improve Rt knee  AROM to 3-120 deg to normalize gait and transfers.    Time 8    Period Weeks    Status New      PT LONG TERM GOAL #3   Title Pt will improve Lt hip/knee strength to overall 4+ to improve function.    Baseline -    Time 8    Period Weeks    Status New      PT LONG TERM GOAL #4   Title Pt will report less than 3-4/10 pain with standing/walking at least 30 minutes and negotiating her 3 steps to enter her house.    Baseline -    Time 8    Period Weeks    Status New                   Plan - 11/14/20 1131     Clinical Impression Statement Active extension in LAQ notedly lacking compared to supine quad set extension measurement.  Pt. to continue to benefit from skilled PT services to facilitate progression in mobility, strength and movement coordination to improve activity.    Personal Factors and Comorbidities Comorbidity 2;Past/Current Experience    Comorbidities DM, HTN    Examination-Activity Limitations Locomotion Level;Transfers    Examination-Participation Restrictions Cleaning;Other   working out at gym   Stability/Clinical Decision Making Stable/Uncomplicated  Rehab Potential Excellent    PT Frequency 2x / week    PT Duration 8 weeks    PT Treatment/Interventions ADLs/Self Care Home Management;Aquatic Therapy;Cryotherapy;Electrical Stimulation;Moist Heat;Balance training;Therapeutic exercise;Therapeutic activities;Functional mobility training;Stair training;Gait training;Patient/family education;Manual techniques;Passive range of motion;Scar mobilization;Taping;Vasopneumatic Device    PT Next Visit Plan Manual for mobility gains, active strengthening progression.  static balance and cane use     PT Home Exercise Plan [id number]    Consulted and Agree with Plan of Care Patient             Patient will benefit from skilled therapeutic intervention in order to improve the following deficits and impairments:  Abnormal gait, Decreased range of motion, Pain, Impaired flexibility, Decreased balance, Decreased strength, Increased edema, Postural dysfunction  Visit Diagnosis: Stiffness of left knee, not elsewhere classified  Chronic pain of left knee  Other abnormalities of gait and mobility  Localized edema  Muscle weakness (generalized)     Problem List Patient Active Problem List   Diagnosis Date Noted   Status post total left knee replacement 10/27/2020   Primary osteoarthritis of left knee    NAFLD (nonalcoholic fatty liver disease) 08/06/2020   Other fatigue 07/23/2020   SOBOE (shortness of breath on exertion) 07/23/2020   Diabetes mellitus ([place]) 07/23/2020   Hypertension associated with type 2 diabetes mellitus ([place]) 07/23/2020   Hyperlipidemia associated with type 2 diabetes mellitus ([place]) 07/23/2020   Absolute anemia 07/23/2020   Vitamin D deficiency 07/23/2020   At risk for heart disease 07/23/2020   AKI (acute kidney injury) ([place]) 09/07/2019   Acute kidney injury ([place]) 09/06/2019   Bradycardia 09/06/2019   Hyperglycemia 09/06/2019   HLD (hyperlipidemia)    Hypertensive nephropathy 05/16/2019   Erosive gastritis 11/16/2017   Fatty liver 11/16/2017   Abdominal pain 12/11/2012   Early satiety 12/11/2012   Hiatal hernia    Hx of cholecystectomy 02/05/2011   Flu-like symptoms 02/05/2011   CONSTIPATION 08/27/2008   FATTY LIVER DISEASE 05/07/2008   Nausea with vomiting 04/23/2008   EPIGASTRIC PAIN 04/23/2008   RENAL CALCULUS 04/22/2008   Type 2 diabetes mellitus with stage 2 chronic kidney disease, with long-term current use of insulin ([place]) 05/19/2006   Type 2 diabetes mellitus with hyperlipidemia ([place]) 05/19/2006   HYPERTENSION, BENIGN SYSTEMIC  05/19/2006   GASTROESOPHAGEAL REFLUX, NO ESOPHAGITIS 05/19/2006   AMENORRHEA 05/19/2006    Scot Jun, PT, DPT, OCS, ATC 11/14/20  11:37 AM    [facility] [address] [place], [place], [address] Phone: [phone]   Fax:  [phone]  Name: PANAYIOTA BRANDNER MRN: [id number] Date of Birth: 01-12-1956

## 2020-11-14 NOTE — Telephone Encounter (Signed)
Error

## 2020-11-17 ENCOUNTER — Telehealth: Payer: Self-pay | Admitting: *Deleted

## 2020-11-17 ENCOUNTER — Ambulatory Visit (INDEPENDENT_AMBULATORY_CARE_PROVIDER_SITE_OTHER): Payer: Medicare Other | Admitting: Physical Therapy

## 2020-11-17 ENCOUNTER — Other Ambulatory Visit: Payer: Self-pay

## 2020-11-17 ENCOUNTER — Encounter: Payer: Self-pay | Admitting: Physical Therapy

## 2020-11-17 DIAGNOSIS — M25562 Pain in left knee: Secondary | ICD-10-CM

## 2020-11-17 DIAGNOSIS — M25662 Stiffness of left knee, not elsewhere classified: Secondary | ICD-10-CM | POA: Diagnosis not present

## 2020-11-17 DIAGNOSIS — R2689 Other abnormalities of gait and mobility: Secondary | ICD-10-CM

## 2020-11-17 DIAGNOSIS — G8929 Other chronic pain: Secondary | ICD-10-CM | POA: Diagnosis not present

## 2020-11-17 DIAGNOSIS — M6281 Muscle weakness (generalized): Secondary | ICD-10-CM | POA: Diagnosis not present

## 2020-11-17 DIAGNOSIS — R600 Localized edema: Secondary | ICD-10-CM

## 2020-11-17 NOTE — Telephone Encounter (Signed)
Ortho bundle call complete.

## 2020-11-17 NOTE — Therapy (Signed)
[facility] [address] [name], [place], [address] Phone: [phone]   Fax:  [phone]  Physical Therapy Treatment  Patient Details  Name: Leslie Davis MRN: [id number] Date of Birth: [birth year] Referring Provider (PT): Frankey Shown MD   Encounter Date: 11/17/2020   PT End of Session - 11/17/20 0932     Visit Number 3    Number of Visits 16    Date for PT Re-Evaluation 01/07/21    Authorization Type UHC MCR    Progress Note Due on Visit 10    PT Start Time 0851    PT Stop Time 0933    PT Time Calculation (min) 42 min    Activity Tolerance Patient tolerated treatment well    Behavior During Therapy [facility] for tasks assessed/performed             Past Medical History:  Diagnosis Date   DM (diabetes mellitus) ([place])    Fatty liver disease, nonalcoholic    Gastritis    Gastroparesis    GERD (gastroesophageal reflux disease)    Heartburn    Hiatal hernia    HLD (hyperlipidemia)    HTN (hypertension)    Joint pain    Renal calculus    Rheumatoid arthritis ([place])    Vitamin D deficiency     Past Surgical History:  Procedure Laterality Date   CHOLECYSTECTOMY     COLONOSCOPY     TOTAL KNEE ARTHROPLASTY Left 10/27/2020   Procedure: LEFT TOTAL KNEE ARTHROPLASTY;  Surgeon: [name], MD;  Location: [place];  Service: Orthopedics;  Laterality: Left;   TUBAL LIGATION     UPPER GASTROINTESTINAL ENDOSCOPY      There were no vitals filed for this visit.   Subjective Assessment - 11/17/20 0852     Subjective pain is a little more elevated today    Pertinent History DM, HTN    How long can you stand comfortably? 20-25 min    How long can you walk comfortably? 30 min    Patient Stated Goals Get back to my old self; no walker    Currently in Pain? Yes    Pain Score 7     Pain Location Knee    Pain Orientation Left    Pain Descriptors / Indicators Aching;Sore;Tightness    Pain Type Surgical pain    Pain Onset 1 to 4 weeks ago     Pain Frequency Constant    Aggravating Factors  bending pain    Pain Relieving Factors avoiding end range pain                [facility] PT Assessment - 11/17/20 0853       Assessment   Medical Diagnosis s/p Lt TKR    Referring Provider (PT) Frankey Shown MD    Onset Date/Surgical Date 10/27/20    Next MD Visit 12/09/20      AROM   Left Knee Flexion 95   supine heel slide     PROM   Left Knee Extension 0                           [facility] Adult PT Treatment/Exercise - 11/17/20 0853       Ambulation/Gait   Ambulation/Gait Yes    Ambulation/Gait Assistance 5: Supervision    Ambulation Distance (Feet) 100 Feet    Assistive device Straight cane    Gait Pattern Step-to pattern;Decreased stance time - left;Decreased step  length - right;Decreased hip/knee flexion - left      Knee/Hip Exercises: Stretches   Knee: Self-Stretch Limitations LLE 10 x 10 sec with RLE overpressure      Knee/Hip Exercises: Aerobic   Nustep Lvl 5 UE/LE 8 mins      Knee/Hip Exercises: Seated   Long Arc Quad Left;3 sets;10 reps    Long Arc Quad Weight 3 lbs.      Knee/Hip Exercises: Supine   Bridges 2 sets;10 reps      Vasopneumatic   Number Minutes Vasopneumatic  10 minutes    Vasopnuematic Location  Knee    Vasopneumatic Pressure Medium    Vasopneumatic Temperature  34      Manual Therapy   Manual therapy comments seated Lt knee flexion c distraction/IR mobilization c movement c contralateral leg movement opposite                      PT Short Term Goals - 11/17/20 0932       PT SHORT TERM GOAL #1   Title Pt will be I and compliant with HEP.    Time 4    Period Weeks    Status On-going      PT SHORT TERM GOAL #2   Title Pt able to safely ambulate community distances without AD    Baseline -    Time 4    Period Weeks    Status On-going               PT Long Term Goals - 11/12/20 1501       PT LONG TERM GOAL #1   Title Pt will improve FOTO to  >= 70    Baseline FS = 52 at intake    Time 8    Period Weeks    Status New    Target Date 01/07/21      PT LONG TERM GOAL #2   Title Pt will improve Rt knee  AROM to 3-120 deg to normalize gait and transfers.    Time 8    Period Weeks    Status New      PT LONG TERM GOAL #3   Title Pt will improve Lt hip/knee strength to overall 4+ to improve function.    Baseline -    Time 8    Period Weeks    Status New      PT LONG TERM GOAL #4   Title Pt will report less than 3-4/10 pain with standing/walking at least 30 minutes and negotiating her 3 steps to enter her house.    Baseline -    Time 8    Period Weeks    Status New                   Plan - 11/17/20 0936     Clinical Impression Statement Pt tolerated session well today, and has demonstrated great improvement in Lt knee active flexion at this time.  She is safe to amb with SPC in the house and short community distances at this time.  Will continue to benefit from PT to maximize function.    Personal Factors and Comorbidities Comorbidity 2;Past/Current Experience    Comorbidities DM, HTN    Examination-Activity Limitations Locomotion Level;Transfers    Examination-Participation Restrictions Cleaning;Other   working out at gym   Stability/Clinical Decision Making Stable/Uncomplicated    Rehab Potential Excellent    PT Frequency 2x / week    PT  Duration 8 weeks    PT Treatment/Interventions ADLs/Self Care Home Management;Aquatic Therapy;Cryotherapy;Electrical Stimulation;Moist Heat;Balance training;Therapeutic exercise;Therapeutic activities;Functional mobility training;Stair training;Gait training;Patient/family education;Manual techniques;Passive range of motion;Scar mobilization;Taping;Vasopneumatic Device    PT Next Visit Plan Manual for mobility gains, active strengthening progression.  static balance, try machines if able    PT Home Exercise Plan [id number]    Consulted and Agree with Plan of Care Patient              Patient will benefit from skilled therapeutic intervention in order to improve the following deficits and impairments:  Abnormal gait, Decreased range of motion, Pain, Impaired flexibility, Decreased balance, Decreased strength, Increased edema, Postural dysfunction  Visit Diagnosis: Stiffness of left knee, not elsewhere classified  Chronic pain of left knee  Other abnormalities of gait and mobility  Localized edema  Muscle weakness (generalized)     Problem List Patient Active Problem List   Diagnosis Date Noted   Status post total left knee replacement 10/27/2020   Primary osteoarthritis of left knee    NAFLD (nonalcoholic fatty liver disease) 08/06/2020   Other fatigue 07/23/2020   SOBOE (shortness of breath on exertion) 07/23/2020   Diabetes mellitus ([place]) 07/23/2020   Hypertension associated with type 2 diabetes mellitus ([place]) 07/23/2020   Hyperlipidemia associated with type 2 diabetes mellitus ([place]) 07/23/2020   Absolute anemia 07/23/2020   Vitamin D deficiency 07/23/2020   At risk for heart disease 07/23/2020   AKI (acute kidney injury) ([place]) 09/07/2019   Acute kidney injury ([place]) 09/06/2019   Bradycardia 09/06/2019   Hyperglycemia 09/06/2019   HLD (hyperlipidemia)    Hypertensive nephropathy 05/16/2019   Erosive gastritis 11/16/2017   Fatty liver 11/16/2017   Abdominal pain 12/11/2012   Early satiety 12/11/2012   Hiatal hernia    Hx of cholecystectomy 02/05/2011   Flu-like symptoms 02/05/2011   CONSTIPATION 08/27/2008   FATTY LIVER DISEASE 05/07/2008   Nausea with vomiting 04/23/2008   EPIGASTRIC PAIN 04/23/2008   RENAL CALCULUS 04/22/2008   Type 2 diabetes mellitus with stage 2 chronic kidney disease, with long-term current use of insulin ([place]) 05/19/2006   Type 2 diabetes mellitus with hyperlipidemia ([place]) 05/19/2006   HYPERTENSION, BENIGN SYSTEMIC 05/19/2006   GASTROESOPHAGEAL REFLUX, NO ESOPHAGITIS 05/19/2006   AMENORRHEA 05/19/2006       Laureen Abrahams, PT, DPT 11/17/20 9:59 AM     [facility] [address] [place], [place], [address] Phone: [phone]   Fax:  [phone]  Name: MAGDLENE PRAK MRN: [id number] Date of Birth: Dec 22, 1955

## 2020-11-19 ENCOUNTER — Other Ambulatory Visit: Payer: Self-pay

## 2020-11-19 ENCOUNTER — Encounter: Payer: Self-pay | Admitting: Physical Therapy

## 2020-11-19 ENCOUNTER — Ambulatory Visit (INDEPENDENT_AMBULATORY_CARE_PROVIDER_SITE_OTHER): Payer: Medicare Other | Admitting: Physical Therapy

## 2020-11-19 DIAGNOSIS — R600 Localized edema: Secondary | ICD-10-CM | POA: Diagnosis not present

## 2020-11-19 DIAGNOSIS — M25662 Stiffness of left knee, not elsewhere classified: Secondary | ICD-10-CM

## 2020-11-19 DIAGNOSIS — R2689 Other abnormalities of gait and mobility: Secondary | ICD-10-CM

## 2020-11-19 DIAGNOSIS — M6281 Muscle weakness (generalized): Secondary | ICD-10-CM

## 2020-11-19 DIAGNOSIS — G8929 Other chronic pain: Secondary | ICD-10-CM | POA: Diagnosis not present

## 2020-11-19 DIAGNOSIS — M25562 Pain in left knee: Secondary | ICD-10-CM

## 2020-11-19 NOTE — Therapy (Signed)
[facility] [address] [place], [place], [address] Phone: [phone]   Fax:  [phone]  Physical Therapy Treatment  Patient Details  Name: DELBERTA Davis MRN: [id number] Date of Birth: 09/13/1955 Referring Provider (PT): Frankey Shown MD   Encounter Date: 11/19/2020   PT End of Session - 11/19/20 1512     Visit Number 4    Number of Visits 16    Date for PT Re-Evaluation 01/07/21    Authorization Type UHC MCR    Progress Note Due on Visit 10    PT Start Time [id number]    PT Stop Time 1520    PT Time Calculation (min) 52 min    Activity Tolerance Patient tolerated treatment well    Behavior During Therapy [facility]FL for tasks assessed/performed             Past Medical History:  Diagnosis Date   DM (diabetes mellitus) ([place])    Fatty liver disease, nonalcoholic    Gastritis    Gastroparesis    GERD (gastroesophageal reflux disease)    Heartburn    Hiatal hernia    HLD (hyperlipidemia)    HTN (hypertension)    Joint pain    Renal calculus    Rheumatoid arthritis ([place])    Vitamin D deficiency     Past Surgical History:  Procedure Laterality Date   CHOLECYSTECTOMY     COLONOSCOPY     TOTAL KNEE ARTHROPLASTY Left 10/27/2020   Procedure: LEFT TOTAL KNEE ARTHROPLASTY;  Surgeon: [name], MD;  Location: [place];  Service: Orthopedics;  Laterality: Left;   TUBAL LIGATION     UPPER GASTROINTESTINAL ENDOSCOPY      There were no vitals filed for this visit.   Subjective Assessment - 11/19/20 1431     Subjective doing well    Pertinent History DM, HTN    How long can you stand comfortably? 20-25 min    How long can you walk comfortably? 30 min    Patient Stated Goals Get back to my old self; no walker    Currently in Pain? Yes    Pain Score 5     Pain Location Knee    Pain Orientation Left    Pain Descriptors / Indicators Aching;Sore;Tightness    Pain Type Surgical pain    Pain Onset 1 to 4 weeks ago    Pain Frequency  Constant    Aggravating Factors  bending the knee    Pain Relieving Factors avoiding end range                               [facility] Adult PT Treatment/Exercise - 11/19/20 1431       Knee/Hip Exercises: Stretches   Knee: Self-Stretch Limitations LLE 10 x 10 sec with RLE overpressure      Knee/Hip Exercises: Aerobic   Nustep Lvl 7 UE/LE 8 mins      Knee/Hip Exercises: Machines for Strengthening   Cybex Leg Press bil push 75# 3x10      Vasopneumatic   Number Minutes Vasopneumatic  10 minutes    Vasopnuematic Location  Knee    Vasopneumatic Pressure Medium    Vasopneumatic Temperature  34      Manual Therapy   Manual therapy comments seated Lt knee flexion c distraction/IR mobilization c movement c contralateral leg movement opposite  PT Short Term Goals - 11/17/20 0932       PT SHORT TERM GOAL #1   Title Pt will be I and compliant with HEP.    Time 4    Period Weeks    Status On-going      PT SHORT TERM GOAL #2   Title Pt able to safely ambulate community distances without AD    Baseline -    Time 4    Period Weeks    Status On-going               PT Long Term Goals - 11/12/20 1501       PT LONG TERM GOAL #1   Title Pt will improve FOTO to >= 70    Baseline FS = 52 at intake    Time 8    Period Weeks    Status New    Target Date 01/07/21      PT LONG TERM GOAL #2   Title Pt will improve Rt knee  AROM to 3-120 deg to normalize gait and transfers.    Time 8    Period Weeks    Status New      PT LONG TERM GOAL #3   Title Pt will improve Lt hip/knee strength to overall 4+ to improve function.    Baseline -    Time 8    Period Weeks    Status New      PT LONG TERM GOAL #4   Title Pt will report less than 3-4/10 pain with standing/walking at least 30 minutes and negotiating her 3 steps to enter her house.    Baseline -    Time 8    Period Weeks    Status New                   Plan -  11/19/20 1513     Clinical Impression Statement Pt tolerated session well today with initiation of weight machines today.  Will continue to benefit from PT to maximize function.    Personal Factors and Comorbidities Comorbidity 2;Past/Current Experience    Comorbidities DM, HTN    Examination-Activity Limitations Locomotion Level;Transfers    Examination-Participation Restrictions Cleaning;Other   working out at gym   Stability/Clinical Decision Making Stable/Uncomplicated    Rehab Potential Excellent    PT Frequency 2x / week    PT Duration 8 weeks    PT Treatment/Interventions ADLs/Self Care Home Management;Aquatic Therapy;Cryotherapy;Electrical Stimulation;Moist Heat;Balance training;Therapeutic exercise;Therapeutic activities;Functional mobility training;Stair training;Gait training;Patient/family education;Manual techniques;Passive range of motion;Scar mobilization;Taping;Vasopneumatic Device    PT Next Visit Plan Manual for mobility gains, active strengthening progression.  static balance, continue flexion focus    PT Home Exercise Plan [id number]    Consulted and Agree with Plan of Care Patient             Patient will benefit from skilled therapeutic intervention in order to improve the following deficits and impairments:  Abnormal gait, Decreased range of motion, Pain, Impaired flexibility, Decreased balance, Decreased strength, Increased edema, Postural dysfunction  Visit Diagnosis: Stiffness of left knee, not elsewhere classified  Chronic pain of left knee  Other abnormalities of gait and mobility  Localized edema  Muscle weakness (generalized)     Problem List Patient Active Problem List   Diagnosis Date Noted   Status post total left knee replacement 10/27/2020   Primary osteoarthritis of left knee    NAFLD (nonalcoholic fatty liver disease) 08/06/2020   Other fatigue 07/23/2020  SOBOE (shortness of breath on exertion) 07/23/2020   Diabetes mellitus ([place])  07/23/2020   Hypertension associated with type 2 diabetes mellitus ([place]) 07/23/2020   Hyperlipidemia associated with type 2 diabetes mellitus ([place]) 07/23/2020   Absolute anemia 07/23/2020   Vitamin D deficiency 07/23/2020   At risk for heart disease 07/23/2020   AKI (acute kidney injury) ([place]) 09/07/2019   Acute kidney injury ([place]) 09/06/2019   Bradycardia 09/06/2019   Hyperglycemia 09/06/2019   HLD (hyperlipidemia)    Hypertensive nephropathy 05/16/2019   Erosive gastritis 11/16/2017   Fatty liver 11/16/2017   Abdominal pain 12/11/2012   Early satiety 12/11/2012   Hiatal hernia    Hx of cholecystectomy 02/05/2011   Flu-like symptoms 02/05/2011   CONSTIPATION 08/27/2008   FATTY LIVER DISEASE 05/07/2008   Nausea with vomiting 04/23/2008   EPIGASTRIC PAIN 04/23/2008   RENAL CALCULUS 04/22/2008   Type 2 diabetes mellitus with stage 2 chronic kidney disease, with long-term current use of insulin ([place]) 05/19/2006   Type 2 diabetes mellitus with hyperlipidemia ([place]) 05/19/2006   HYPERTENSION, BENIGN SYSTEMIC 05/19/2006   GASTROESOPHAGEAL REFLUX, NO ESOPHAGITIS 05/19/2006   AMENORRHEA 05/19/2006       Laureen Abrahams, PT, DPT 11/19/20 3:15 PM      [facility] [address] [place], [place], [address] Phone: [phone]   Fax:  [phone]  Name: FLANNERY SMITHART MRN: [id number] Date of Birth: 1955-07-22

## 2020-11-20 ENCOUNTER — Ambulatory Visit: Payer: Medicare Other

## 2020-11-20 ENCOUNTER — Other Ambulatory Visit: Payer: Self-pay | Admitting: Physician Assistant

## 2020-11-20 ENCOUNTER — Ambulatory Visit (INDEPENDENT_AMBULATORY_CARE_PROVIDER_SITE_OTHER): Payer: Medicare Other | Admitting: Internal Medicine

## 2020-11-20 ENCOUNTER — Encounter: Payer: Self-pay | Admitting: Internal Medicine

## 2020-11-20 VITALS — BP 128/68 | HR 82 | Temp 98.2°F | Ht 64.0 in | Wt 186.2 lb

## 2020-11-20 DIAGNOSIS — Z0000 Encounter for general adult medical examination without abnormal findings: Secondary | ICD-10-CM

## 2020-11-20 DIAGNOSIS — N182 Chronic kidney disease, stage 2 (mild): Secondary | ICD-10-CM

## 2020-11-20 DIAGNOSIS — Z794 Long term (current) use of insulin: Secondary | ICD-10-CM | POA: Diagnosis not present

## 2020-11-20 DIAGNOSIS — I129 Hypertensive chronic kidney disease with stage 1 through stage 4 chronic kidney disease, or unspecified chronic kidney disease: Secondary | ICD-10-CM | POA: Diagnosis not present

## 2020-11-20 DIAGNOSIS — E6609 Other obesity due to excess calories: Secondary | ICD-10-CM

## 2020-11-20 DIAGNOSIS — E559 Vitamin D deficiency, unspecified: Secondary | ICD-10-CM | POA: Diagnosis not present

## 2020-11-20 DIAGNOSIS — E1122 Type 2 diabetes mellitus with diabetic chronic kidney disease: Secondary | ICD-10-CM

## 2020-11-20 DIAGNOSIS — Z6831 Body mass index (BMI) 31.0-31.9, adult: Secondary | ICD-10-CM

## 2020-11-20 DIAGNOSIS — Z23 Encounter for immunization: Secondary | ICD-10-CM

## 2020-11-20 DIAGNOSIS — E1169 Type 2 diabetes mellitus with other specified complication: Secondary | ICD-10-CM

## 2020-11-20 LAB — POCT URINALYSIS DIPSTICK
Bilirubin, UA: NEGATIVE
Blood, UA: NEGATIVE
Glucose, UA: NEGATIVE
Leukocytes, UA: NEGATIVE
Nitrite, UA: NEGATIVE
Protein, UA: POSITIVE — AB
Spec Grav, UA: >=1.03 — AB (ref 1.010–1.025)
Urobilinogen, UA: 0.2 E.U./dL
pH, UA: 5.5 (ref 5.0–8.0)

## 2020-11-20 NOTE — Progress Notes (Signed)
I,Yamilka [employer] as a Education administrator for [name], MD.,have documented all relevant documentation on the behalf of [name], MD,as directed by  [name], MD while in the presence of [name], MD.  This visit occurred during the SARS-CoV-2 public health emergency.  Safety protocols were in place, including screening questions prior to the visit, additional usage of staff PPE, and extensive cleaning of exam room while observing appropriate contact time as indicated for disinfecting solutions.  Subjective:     Patient ID: Leslie Davis , female    DOB: Mar 18, 1956 , 65 y.o.   MRN: [id number]   Chief Complaint  Patient presents with   Annual Exam   Diabetes   Hypertension    HPI  Patient here for HM. Patient is followed by GYN, Dr. [place] for her pelvic exams. She was last seen in 2021.   She had a left knee replacement on 8/9. Currently using a walker. She states she has been participating in PT. She feels she is making an improvement.    Diabetes She presents for her follow-up diabetic visit. She has type 2 diabetes mellitus. Her disease course has been stable. There are no hypoglycemic associated symptoms. Pertinent negatives for diabetes include no blurred vision. There are no hypoglycemic complications. Diabetic complications include nephropathy. Risk factors for coronary artery disease include diabetes mellitus, dyslipidemia, hypertension and post-menopausal.  Hypertension This is a chronic problem. The current episode started more than 1 year ago. The problem has been gradually improving since onset. Pertinent negatives include no blurred vision. Risk factors for coronary artery disease include dyslipidemia and diabetes mellitus. The current treatment provides moderate improvement.    Past Medical History:  Diagnosis Date   DM (diabetes mellitus) ([place])    Fatty liver disease, nonalcoholic    Gastritis    Gastroparesis    GERD (gastroesophageal  reflux disease)    Heartburn    Hiatal hernia    HLD (hyperlipidemia)    HTN (hypertension)    Joint pain    Renal calculus    Rheumatoid arthritis ([place])    Vitamin D deficiency      Family History  Problem Relation Age of Onset   Diabetes Mother    Thyroid disease Mother    Kidney disease Mother    Diabetes Maternal Grandmother    Stomach cancer Maternal Aunt        X 2 aunts   Hypertension Father    Hyperlipidemia Father    Sudden death Father    Stroke Father    Colon cancer Neg Hx    Esophageal cancer Neg Hx    Rectal cancer Neg Hx      Current Outpatient Medications:    amLODipine (NORVASC) 10 MG tablet, Take 1 tablet (10 mg total) by mouth daily., Disp: 90 tablet, Rfl: 0   aspirin EC 81 MG tablet, Take 1 tablet (81 mg total) by mouth 2 (two) times daily. To be taken after surgery, Disp: 84 tablet, Rfl: 0   Azilsartan Medoxomil (EDARBI) 80 MG TABS, TAKE 1 TABLET EACH DAY., Disp: 90 tablet, Rfl: 2   Blood Glucose Monitoring Suppl (ONETOUCH VERIO) w/Device KIT, Use as directed to check blood sugars 2 times per day dx: e11.65, Disp: 1 kit, Rfl: 1   cetirizine (ZYRTEC) 10 MG tablet, Take 1 tablet (10 mg total) by mouth daily., Disp: 90 tablet, Rfl: 2   Cholecalciferol (VITAMIN D3) 1.25 MG (50000 UT) CAPS, Take 1 capsule by mouth  every 2 wks (Patient taking differently: Take 50,000 Units by mouth every 14 (fourteen) days.), Disp: 2 capsule, Rfl: 0   docusate sodium (COLACE) 100 MG capsule, Take 1 capsule (100 mg total) by mouth daily as needed. (Patient taking differently: Take 100 mg by mouth daily as needed for mild constipation.), Disp: 30 capsule, Rfl: 2   ferrous gluconate (FERGON) 324 MG tablet, TAKE 1 TABLET BY MOUTH TWICE DAILY WITH A MEAL. (Patient taking differently: Take 324 mg by mouth 2 (two) times daily with a meal.), Disp: 100 tablet, Rfl: 0   glucose blood (ONETOUCH VERIO) test strip, Use as instructed to check blood sugars 2 times per day dx:e11.65, Disp: 150  each, Rfl: 3   insulin glargine, 1 Unit Dial, (TOUJEO SOLOSTAR) 300 UNIT/ML Solostar Pen, Inject 35 units subcutaneous at bedtime, Disp: 22.5 mL, Rfl: 1   insulin lispro (HUMALOG KWIKPEN) 100 UNIT/ML KwikPen, Sliding scale (Patient taking differently: Inject 4-8 Units into the skin daily as needed (CBG over 150). Sliding scale), Disp: 30 mL, Rfl: 2   Insulin Pen Needle 32G X 6 MM MISC, Use with pen injectors daily dx code: e11.9, Disp: 150 each, Rfl: 3   linaclotide ([name]) 145 MCG CAPS capsule, Take 1 capsule (145 mcg total) by mouth daily., Disp: 30 capsule, Rfl: 0   OneTouch Delica Lancets 00X MISC, Use as directed to check blood sugars 2 times per day dx:e11.65, Disp: 150 each, Rfl: 3   pantoprazole (PROTONIX) 40 MG tablet, Take one tablet by mouth 3 days a week (Patient taking differently: Take 40 mg by mouth 3 (three) times a week.), Disp: 90 tablet, Rfl: 2   rosuvastatin (CRESTOR) 20 MG tablet, Take 1 tablet (20 mg total) by mouth daily., Disp: 90 tablet, Rfl: 1   Semaglutide, 1 MG/DOSE, (OZEMPIC, 1 MG/DOSE,) 4 MG/3ML SOPN, Inject 1 mg into the skin once a week. (Patient taking differently: Inject 1 mg into the skin once a week. Tuesday), Disp: 9 mL, Rfl: 3   temazepam (RESTORIL) 30 MG capsule, One capsules po qhs prn (Patient taking differently: Take 30 mg by mouth at bedtime as needed for sleep.), Disp: 60 capsule, Rfl: 0   dapagliflozin propanediol (FARXIGA) 10 MG TABS tablet, Take 1 tablet (10 mg total) by mouth daily before breakfast., Disp: 30 tablet, Rfl: 2   oxyCODONE-acetaminophen (PERCOCET) 5-325 MG tablet, Take 1 tablet by mouth every 12 (twelve) hours as needed. To be taken after surgery, Disp: 30 tablet, Rfl: 0   Allergies  Allergen Reactions   Fiasp [Insulin Aspart (W-Niacinamide)] Hives      The patient states she uses none for birth control. Last LMP was No LMP recorded. Patient is postmenopausal.. Negative for Dysmenorrhea. Negative for: breast discharge, breast lump(s),  breast pain and breast self exam. Associated symptoms include abnormal vaginal bleeding. Pertinent negatives include abnormal bleeding (hematology), anxiety, decreased libido, depression, difficulty falling sleep, dyspareunia, history of infertility, nocturia, sexual dysfunction, sleep disturbances, urinary incontinence, urinary urgency, vaginal discharge and vaginal itching. Diet regular.The patient states her exercise level is  intermittent.  . The patient's tobacco use is:  Social History   Tobacco Use  Smoking Status Former   Packs/day: 0.25   Years: 1.00   Pack years: 0.25   Types: Cigarettes   Quit date: 09/10/1974   Years since quitting: 46.2  Smokeless Tobacco Never  Tobacco Comments   she no longer smokes.   . She has been exposed to passive smoke. The patient's alcohol use is:  Social  History   Substance and Sexual Activity  Alcohol Use No    Review of Systems  Constitutional: Negative.   HENT: Negative.    Eyes: Negative.  Negative for blurred vision.  Respiratory: Negative.    Cardiovascular: Negative.   Gastrointestinal: Negative.   Endocrine: Negative.   Genitourinary: Negative.   Musculoskeletal:  Positive for arthralgias.       She has left knee pain, currently in PT  Skin: Negative.   Allergic/Immunologic: Negative.   Neurological: Negative.   Hematological: Negative.   Psychiatric/Behavioral: Negative.      Today's Vitals   11/20/20 1029  BP: 128/68  Pulse: 82  Temp: 98.2 F (36.8 C)  Weight: 186 lb 3.2 oz (84.5 kg)  Height: 5\' 4"  (1.626 m)  PainSc: 6   PainLoc: Knee   Body mass index is 31.96 kg/m.  Wt Readings from Last 3 Encounters:  11/20/20 186 lb 3.2 oz (84.5 kg)  10/27/20 193 lb (87.5 kg)  10/22/20 196 lb 14.4 oz (89.3 kg)     Objective:  Physical Exam Vitals and nursing note reviewed.  Constitutional:      Appearance: Normal appearance. She is obese.     Comments: Examined while in chair  HENT:     Head: Normocephalic and  atraumatic.     Right Ear: Tympanic membrane, ear canal and external ear normal.     Left Ear: Tympanic membrane, ear canal and external ear normal.     Nose:     Comments: Masked     Mouth/Throat:     Comments: Masked  Eyes:     Extraocular Movements: Extraocular movements intact.     Conjunctiva/sclera: Conjunctivae normal.     Pupils: Pupils are equal, round, and reactive to light.  Cardiovascular:     Rate and Rhythm: Normal rate and regular rhythm.     Pulses: Normal pulses.          Dorsalis pedis pulses are 2+ on the right side and 2+ on the left side.     Heart sounds: Normal heart sounds.  Pulmonary:     Effort: Pulmonary effort is normal.     Breath sounds: Normal breath sounds.  Chest:  Breasts:    Tanner Score is 5.     Right: Normal.     Left: Normal.  Abdominal:     General: Abdomen is flat. Bowel sounds are normal.     Palpations: Abdomen is soft.  Genitourinary:    Comments: deferred Musculoskeletal:        General: Normal range of motion.     Cervical back: Normal range of motion and neck supple.     Comments: Healing surgical scar on left knee. No surrounding erythema/drainage  Feet:     Right foot:     Protective Sensation: 5 sites tested.  5 sites sensed.     Skin integrity: Dry skin present.     Toenail Condition: Right toenails are normal.     Left foot:     Protective Sensation: 5 sites tested.  5 sites sensed.     Skin integrity: Dry skin present.     Toenail Condition: Left toenails are normal.  Skin:    General: Skin is warm and dry.  Neurological:     General: No focal deficit present.     Mental Status: She is alert and oriented to person, place, and time.  Psychiatric:        Mood and Affect: Mood normal.  Behavior: Behavior normal.        Assessment And Plan:     1. Encounter for general adult medical examination w/o abnormal findings Comments: A full exam was performed. Importance of monthly self breast exams was discussed  with the patient. PATIENT IS ADVISED TO GET 30-45 MINUTES REGULAR EXERCISE NO LESS THAN FOUR TO FIVE DAYS PER WEEK - BOTH WEIGHTBEARING EXERCISES AND AEROBIC ARE RECOMMENDED.  PATIENT IS ADVISED TO FOLLOW A HEALTHY DIET WITH AT LEAST SIX FRUITS/VEGGIES PER DAY, DECREASE INTAKE OF RED MEAT, AND TO INCREASE FISH INTAKE TO TWO DAYS PER WEEK.  MEATS/FISH SHOULD NOT BE FRIED, BAKED OR BROILED IS PREFERABLE.  IT IS ALSO IMPORTANT TO CUT BACK ON YOUR SUGAR INTAKE. PLEASE AVOID ANYTHING WITH ADDED SUGAR, CORN SYRUP OR OTHER SWEETENERS. IF YOU MUST USE A SWEETENER, YOU CAN TRY STEVIA. IT IS ALSO IMPORTANT TO AVOID ARTIFICIALLY SWEETENERS AND DIET BEVERAGES. LASTLY, I SUGGEST WEARING SPF 50 SUNSCREEN ON EXPOSED PARTS AND ESPECIALLY WHEN IN THE DIRECT SUNLIGHT FOR AN EXTENDED PERIOD OF TIME.  PLEASE AVOID FAST FOOD RESTAURANTS AND INCREASE YOUR WATER INTAKE.   2. Type 2 diabetes mellitus with stage 2 chronic kidney disease, with long-term current use of insulin (HCC) Comments: Diabetic foot exam was performed.  I DISCUSSED WITH THE PATIENT AT LENGTH REGARDING THE GOALS OF GLYCEMIC CONTROL AND POSSIBLE LONG-TERM COMPLICATIONS.  I  ALSO STRESSED THE IMPORTANCE OF COMPLIANCE WITH HOME GLUCOSE MONITORING, DIETARY RESTRICTIONS INCLUDING AVOIDANCE OF SUGARY DRINKS/PROCESSED FOODS,  ALONG WITH REGULAR EXERCISE.  I  ALSO STRESSED THE IMPORTANCE OF ANNUAL EYE EXAMS, SELF FOOT CARE AND COMPLIANCE WITH OFFICE VISITS.   - POCT Urinalysis Dipstick (81002) - Microalbumin / Creatinine Urine Ratio - CMP14+EGFR - Lipid panel - CBC no Diff  3. Hypertensive nephropathy Comments: Chronic, well controlled. She is encouraged to limit her sodium intake. EKG not performed, recent ECG reviewed. She agrees to rto in 4-6 months for f/u.  4. Vitamin D deficiency Comments: I will check vitamin D level and supplement as needed.  - Vitamin D (25 hydroxy)  5. Class 1 obesity due to excess calories with serious comorbidity and body mass  index (BMI) of 31.0 to 31.9 in adult Comments: She is encouraged to strive for BMi less than 30 to decrease cardiac risk. Advised to aim for at least 150 minutes of exercise per week.   Patient was given opportunity to ask questions. Patient verbalized understanding of the plan and was able to repeat key elements of the plan. All questions were answered to their satisfaction.   I, [name], MD, have reviewed all documentation for this visit. The documentation on 12/01/20 for the exam, diagnosis, procedures, and orders are all accurate and complete.   THE PATIENT IS ENCOURAGED TO PRACTICE SOCIAL DISTANCING DUE TO THE COVID-19 PANDEMIC.

## 2020-11-20 NOTE — Patient Instructions (Signed)
Health Maintenance, Female Adopting a healthy lifestyle and getting preventive care are important in promoting health and wellness. Ask your health care provider about: The right schedule for you to have regular tests and exams. Things you can do on your own to prevent diseases and keep yourself healthy. What should I know about diet, weight, and exercise? Eat a healthy diet  Eat a diet that includes plenty of vegetables, fruits, low-fat dairy products, and lean protein. Do not eat a lot of foods that are high in solid fats, added sugars, or sodium. Maintain a healthy weight Body mass index (BMI) is used to identify weight problems. It estimates body fat based on height and weight. Your health care provider can help determine your BMI and help you achieve or maintain a healthy weight. Get regular exercise Get regular exercise. This is one of the most important things you can do for your health. Most adults should: Exercise for at least 150 minutes each week. The exercise should increase your heart rate and make you sweat (moderate-intensity exercise). Do strengthening exercises at least twice a week. This is in addition to the moderate-intensity exercise. Spend less time sitting. Even light physical activity can be beneficial. Watch cholesterol and blood lipids Have your blood tested for lipids and cholesterol at 67 years of age, then have this test every 5 years. Have your cholesterol levels checked more often if: Your lipid or cholesterol levels are high. You are older than 67 years of age. You are at high risk for heart disease. What should I know about cancer screening? Depending on your health history and family history, you may need to have cancer screening at various ages. This may include screening for: Breast cancer. Cervical cancer. Colorectal cancer. Skin cancer. Lung cancer. What should I know about heart disease, diabetes, and high blood pressure? Blood pressure and heart  disease High blood pressure causes heart disease and increases the risk of stroke. This is more likely to develop in people who have high blood pressure readings, are of African descent, or are overweight. Have your blood pressure checked: Every 3-5 years if you are 18-39 years of age. Every year if you are 40 years old or older. Diabetes Have regular diabetes screenings. This checks your fasting blood sugar level. Have the screening done: Once every three years after age 40 if you are at a normal weight and have a low risk for diabetes. More often and at a younger age if you are overweight or have a high risk for diabetes. What should I know about preventing infection? Hepatitis B If you have a higher risk for hepatitis B, you should be screened for this virus. Talk with your health care provider to find out if you are at risk for hepatitis B infection. Hepatitis C Testing is recommended for: Everyone born from 1945 through 1965. Anyone with known risk factors for hepatitis C. Sexually transmitted infections (STIs) Get screened for STIs, including gonorrhea and chlamydia, if: You are sexually active and are younger than 67 years of age. You are older than 67 years of age and your health care provider tells you that you are at risk for this type of infection. Your sexual activity has changed since you were last screened, and you are at increased risk for chlamydia or gonorrhea. Ask your health care provider if you are at risk. Ask your health care provider about whether you are at high risk for HIV. Your health care provider may recommend a prescription medicine   to help prevent HIV infection. If you choose to take medicine to prevent HIV, you should first get tested for HIV. You should then be tested every 3 months for as long as you are taking the medicine. Pregnancy If you are about to stop having your period (premenopausal) and you may become pregnant, seek counseling before you get  pregnant. Take 400 to 800 micrograms (mcg) of folic acid every day if you become pregnant. Ask for birth control (contraception) if you want to prevent pregnancy. Osteoporosis and menopause Osteoporosis is a disease in which the bones lose minerals and strength with aging. This can result in bone fractures. If you are 65 years old or older, or if you are at risk for osteoporosis and fractures, ask your health care provider if you should: Be screened for bone loss. Take a calcium or vitamin D supplement to lower your risk of fractures. Be given hormone replacement therapy (HRT) to treat symptoms of menopause. Follow these instructions at home: Lifestyle Do not use any products that contain nicotine or tobacco, such as cigarettes, e-cigarettes, and chewing tobacco. If you need help quitting, ask your health care provider. Do not use street drugs. Do not share needles. Ask your health care provider for help if you need support or information about quitting drugs. Alcohol use Do not drink alcohol if: Your health care provider tells you not to drink. You are pregnant, may be pregnant, or are planning to become pregnant. If you drink alcohol: Limit how much you use to 0-1 drink a day. Limit intake if you are breastfeeding. Be aware of how much alcohol is in your drink. In the [place] one drink equals one 12 oz bottle of beer (355 mL), one 5 oz glass of wine (148 mL), or one 1 oz glass of hard liquor (44 mL). General instructions Schedule regular health, dental, and eye exams. Stay current with your vaccines. Tell your health care provider if: You often feel depressed. You have ever been abused or do not feel safe at home. Summary Adopting a healthy lifestyle and getting preventive care are important in promoting health and wellness. Follow your health care provider's instructions about healthy diet, exercising, and getting tested or screened for diseases. Follow your health care provider's  instructions on monitoring your cholesterol and blood pressure. This information is not intended to replace advice given to you by your health care provider. Make sure you discuss any questions you have with your health care provider. Document Revised: 05/16/2020 Document Reviewed: 03/01/2018 Elsevier Patient Education  2022 [employer]  

## 2020-11-21 ENCOUNTER — Other Ambulatory Visit: Payer: Self-pay | Admitting: Surgical

## 2020-11-21 ENCOUNTER — Other Ambulatory Visit: Payer: Self-pay

## 2020-11-21 ENCOUNTER — Telehealth: Payer: Self-pay | Admitting: Orthopaedic Surgery

## 2020-11-21 LAB — CBC
Hematocrit: 35 % (ref 34.0–46.6)
Hemoglobin: 11.7 g/dL (ref 11.1–15.9)
MCH: 28.1 pg (ref 26.6–33.0)
MCHC: 33.4 g/dL (ref 31.5–35.7)
MCV: 84 fL (ref 79–97)
Platelets: 325 10*3/uL (ref 150–450)
RBC: 4.17 x10E6/uL (ref 3.77–5.28)
RDW: 12.8 % (ref 11.7–15.4)
WBC: 4.4 10*3/uL (ref 3.4–10.8)

## 2020-11-21 LAB — LIPID PANEL
Chol/HDL Ratio: 2.6 ratio (ref 0.0–4.4)
Cholesterol, Total: 156 mg/dL (ref 100–199)
HDL: 61 mg/dL (ref >39)
LDL Chol Calc (NIH): 78 mg/dL (ref 0–99)
Triglycerides: 89 mg/dL (ref 0–149)
VLDL Cholesterol Cal: 17 mg/dL (ref 5–40)

## 2020-11-21 LAB — VITAMIN D 25 HYDROXY (VIT D DEFICIENCY, FRACTURES): Vit D, 25-Hydroxy: 102 ng/mL — ABNORMAL HIGH (ref 30.0–100.0)

## 2020-11-21 LAB — CMP14+EGFR
ALT: 28 IU/L (ref 0–32)
AST: 23 IU/L (ref 0–40)
Albumin/Globulin Ratio: 1.2 (ref 1.2–2.2)
Albumin: 3.8 g/dL (ref 3.8–4.8)
Alkaline Phosphatase: 289 IU/L — ABNORMAL HIGH (ref 44–121)
BUN/Creatinine Ratio: 16 (ref 12–28)
BUN: 20 mg/dL (ref 8–27)
Bilirubin Total: <0.2 mg/dL (ref 0.0–1.2)
CO2: 22 mmol/L (ref 20–29)
Calcium: 9.8 mg/dL (ref 8.7–10.3)
Chloride: 102 mmol/L (ref 96–106)
Creatinine, Ser: 1.27 mg/dL — ABNORMAL HIGH (ref 0.57–1.00)
Globulin, Total: 3.2 g/dL (ref 1.5–4.5)
Glucose: 113 mg/dL — ABNORMAL HIGH (ref 65–99)
Potassium: 4.8 mmol/L (ref 3.5–5.2)
Sodium: 138 mmol/L (ref 134–144)
Total Protein: 7 g/dL (ref 6.0–8.5)
eGFR: 46 mL/min/{1.73_m2} — ABNORMAL LOW (ref >59)

## 2020-11-21 LAB — MICROALBUMIN / CREATININE URINE RATIO
Creatinine, Urine: 261.3 mg/dL
Microalb/Creat Ratio: 602 mg/g creat — ABNORMAL HIGH (ref 0–29)
Microalbumin, Urine: 1571.8 ug/mL

## 2020-11-21 MED ORDER — OXYCODONE-ACETAMINOPHEN 5-325 MG PO TABS: 1.0000 | ORAL_TABLET | Freq: Two times a day (BID) | ORAL | 0 refills | Status: DC | PRN

## 2020-11-21 MED ORDER — DAPAGLIFLOZIN PROPANEDIOL 10 MG PO TABS: 10.0000 mg | ORAL_TABLET | Freq: Every day | ORAL | 2 refills | Status: DC

## 2020-11-21 NOTE — Telephone Encounter (Signed)
Lmom that her rx was sent to her pharmacy 

## 2020-11-21 NOTE — Telephone Encounter (Signed)
Can you help with this since dr. Erlinda Hong and lindsey are off?

## 2020-11-21 NOTE — Telephone Encounter (Signed)
Sent in rx.

## 2020-11-21 NOTE — Telephone Encounter (Signed)
Patient called. She would like a refill on oxycodone called in. Her call back number is [phone]

## 2020-11-25 ENCOUNTER — Ambulatory Visit (INDEPENDENT_AMBULATORY_CARE_PROVIDER_SITE_OTHER): Payer: Medicare Other

## 2020-11-25 ENCOUNTER — Telehealth: Payer: Medicare Other

## 2020-11-25 ENCOUNTER — Ambulatory Visit (INDEPENDENT_AMBULATORY_CARE_PROVIDER_SITE_OTHER): Payer: Medicare Other | Admitting: Rehabilitative and Restorative Service Providers"

## 2020-11-25 ENCOUNTER — Encounter: Payer: Self-pay | Admitting: Rehabilitative and Restorative Service Providers"

## 2020-11-25 ENCOUNTER — Telehealth: Payer: Self-pay

## 2020-11-25 ENCOUNTER — Other Ambulatory Visit: Payer: Self-pay

## 2020-11-25 DIAGNOSIS — R2689 Other abnormalities of gait and mobility: Secondary | ICD-10-CM

## 2020-11-25 DIAGNOSIS — M25662 Stiffness of left knee, not elsewhere classified: Secondary | ICD-10-CM | POA: Diagnosis not present

## 2020-11-25 DIAGNOSIS — R600 Localized edema: Secondary | ICD-10-CM | POA: Diagnosis not present

## 2020-11-25 DIAGNOSIS — M6281 Muscle weakness (generalized): Secondary | ICD-10-CM

## 2020-11-25 DIAGNOSIS — N182 Chronic kidney disease, stage 2 (mild): Secondary | ICD-10-CM

## 2020-11-25 DIAGNOSIS — M25562 Pain in left knee: Secondary | ICD-10-CM | POA: Diagnosis not present

## 2020-11-25 DIAGNOSIS — M1712 Unilateral primary osteoarthritis, left knee: Secondary | ICD-10-CM

## 2020-11-25 DIAGNOSIS — E1122 Type 2 diabetes mellitus with diabetic chronic kidney disease: Secondary | ICD-10-CM

## 2020-11-25 DIAGNOSIS — G8929 Other chronic pain: Secondary | ICD-10-CM | POA: Diagnosis not present

## 2020-11-25 DIAGNOSIS — I129 Hypertensive chronic kidney disease with stage 1 through stage 4 chronic kidney disease, or unspecified chronic kidney disease: Secondary | ICD-10-CM

## 2020-11-25 NOTE — Therapy (Signed)
[facility] [address] [place], [place], [address] Phone: [phone]   Fax:  [phone]  Physical Therapy Treatment  Patient Details  Name: Leslie Davis MRN: [id number] Date of Birth: [birth year] Referring Provider (PT): Frankey Shown MD   Encounter Date: 11/25/2020   PT End of Session - 11/25/20 1353     Visit Number 5    Number of Visits 16    Date for PT Re-Evaluation 01/07/21    Authorization Type UHC MCR    Progress Note Due on Visit 10    PT Start Time [id number]    PT Stop Time 1435    PT Time Calculation (min) 52 min    Activity Tolerance Patient tolerated treatment well    Behavior During Therapy WFL for tasks assessed/performed             Past Medical History:  Diagnosis Date   DM (diabetes mellitus) ([place])    Fatty liver disease, nonalcoholic    Gastritis    Gastroparesis    GERD (gastroesophageal reflux disease)    Heartburn    Hiatal hernia    HLD (hyperlipidemia)    HTN (hypertension)    Joint pain    Renal calculus    Rheumatoid arthritis ([place])    Vitamin D deficiency     Past Surgical History:  Procedure Laterality Date   CHOLECYSTECTOMY     COLONOSCOPY     TOTAL KNEE ARTHROPLASTY Left 10/27/2020   Procedure: LEFT TOTAL KNEE ARTHROPLASTY;  Surgeon: [name], MD;  Location: [place];  Service: Orthopedics;  Laterality: Left;   TUBAL LIGATION     UPPER GASTROINTESTINAL ENDOSCOPY      There were no vitals filed for this visit.   Subjective Assessment - 11/25/20 1351     Subjective Pt. indicated pain was around a 7/10 when resting waiting for appointment today.  Pt. stated nothing else specific about knee upon arrival.    Pertinent History DM, HTN    Patient Stated Goals Get back to my old self; no walker    Currently in Pain? Yes    Pain Score 7     Pain Location Knee    Pain Orientation Left    Pain Descriptors / Indicators Aching;Tightness;Sore    Pain Type Surgical pain    Pain Onset 1 to 4  weeks ago    Pain Frequency Constant    Aggravating Factors  consistent pain reported at rest and increased at end ranges    Pain Relieving Factors trying to rest                               [facility] Adult PT Treatment/Exercise - 11/25/20 0001       Knee/Hip Exercises: Stretches   Knee: Self-Stretch Limitations in corporated seated knee stretch into LAQ exercise    Gastroc Stretch 30 seconds;3 reps;Both   incilne board     Knee/Hip Exercises: Aerobic   Recumbent Bike Partial circles 6 mins - seat 6      Knee/Hip Exercises: Machines for Strengthening   Cybex Leg Press bilateral 2 x 15 75 lbs, Lt leg  x 10 25 lbs      Knee/Hip Exercises: Seated   Long Arc Quad Left   3 x 10 c contralateral leg movement opposite, pause in flexion and extension   Long Arc Quad Weight 5 lbs.      Vasopneumatic   Number  Minutes Vasopneumatic  10 minutes    Vasopnuematic Location  Knee    Vasopneumatic Pressure Medium    Vasopneumatic Temperature  34      Manual Therapy   Manual therapy comments seated Lt knee flexion c distraction/IR mobilization c movement c contralateral leg movement opposite                      PT Short Term Goals - 11/17/20 0932       PT SHORT TERM GOAL #1   Title Pt will be I and compliant with HEP.    Time 4    Period Weeks    Status On-going      PT SHORT TERM GOAL #2   Title Pt able to safely ambulate community distances without AD    Baseline -    Time 4    Period Weeks    Status On-going               PT Long Term Goals - 11/12/20 1501       PT LONG TERM GOAL #1   Title Pt will improve FOTO to >= 70    Baseline FS = 52 at intake    Time 8    Period Weeks    Status New    Target Date 01/07/21      PT LONG TERM GOAL #2   Title Pt will improve Rt knee  AROM to 3-120 deg to normalize gait and transfers.    Time 8    Period Weeks    Status New      PT LONG TERM GOAL #3   Title Pt will improve Lt hip/knee  strength to overall 4+ to improve function.    Baseline -    Time 8    Period Weeks    Status New      PT LONG TERM GOAL #4   Title Pt will report less than 3-4/10 pain with standing/walking at least 30 minutes and negotiating her 3 steps to enter her house.    Baseline -    Time 8    Period Weeks    Status New                   Plan - 11/25/20 1424     Clinical Impression Statement Fatigue noted in strengthening intervention.  Continued slow but steady progression in strengthening plan.  Continued manual intervention and ther ex to gain range of motion in each direction warranted at this time.    Personal Factors and Comorbidities Comorbidity 2;Past/Current Experience    Comorbidities DM, HTN    Examination-Activity Limitations Locomotion Level;Transfers    Examination-Participation Restrictions Cleaning;Other   working out at gym   Stability/Clinical Decision Making Stable/Uncomplicated    Rehab Potential Excellent    PT Frequency 2x / week    PT Duration 8 weeks    PT Treatment/Interventions ADLs/Self Care Home Management;Aquatic Therapy;Cryotherapy;Electrical Stimulation;Moist Heat;Balance training;Therapeutic exercise;Therapeutic activities;Functional mobility training;Stair training;Gait training;Patient/family education;Manual techniques;Passive range of motion;Scar mobilization;Taping;Vasopneumatic Device    PT Next Visit Plan Continue to progress strengthening and mobility interventions.  Static balance to improve ambulation transitioning towards independent ambulation.    PT Home Exercise Plan [email or web address]    Consulted and Agree with Plan of Care Patient             Patient will benefit from skilled therapeutic intervention in order to improve the following deficits and impairments:  Abnormal gait, Decreased range  of motion, Pain, Impaired flexibility, Decreased balance, Decreased strength, Increased edema, Postural dysfunction  Visit Diagnosis: Stiffness of  left knee, not elsewhere classified  Chronic pain of left knee  Other abnormalities of gait and mobility  Localized edema  Muscle weakness (generalized)     Problem List Patient Active Problem List   Diagnosis Date Noted   Status post total left knee replacement 10/27/2020   Primary osteoarthritis of left knee    NAFLD (nonalcoholic fatty liver disease) 08/06/2020   Other fatigue 07/23/2020   SOBOE (shortness of breath on exertion) 07/23/2020   Diabetes mellitus ([place]) 07/23/2020   Hypertension associated with type 2 diabetes mellitus ([place]) 07/23/2020   Hyperlipidemia associated with type 2 diabetes mellitus ([place]) 07/23/2020   Absolute anemia 07/23/2020   Vitamin D deficiency 07/23/2020   At risk for heart disease 07/23/2020   AKI (acute kidney injury) ([place]) 09/07/2019   Acute kidney injury ([place]) 09/06/2019   Bradycardia 09/06/2019   Hyperglycemia 09/06/2019   HLD (hyperlipidemia)    Hypertensive nephropathy 05/16/2019   Erosive gastritis 11/16/2017   Fatty liver 11/16/2017   Abdominal pain 12/11/2012   Early satiety 12/11/2012   Hiatal hernia    Hx of cholecystectomy 02/05/2011   Flu-like symptoms 02/05/2011   CONSTIPATION 08/27/2008   FATTY LIVER DISEASE 05/07/2008   Nausea with vomiting 04/23/2008   EPIGASTRIC PAIN 04/23/2008   RENAL CALCULUS 04/22/2008   Type 2 diabetes mellitus with stage 2 chronic kidney disease, with long-term current use of insulin ([place]) 05/19/2006   Type 2 diabetes mellitus with hyperlipidemia ([place]) 05/19/2006   HYPERTENSION, BENIGN SYSTEMIC 05/19/2006   GASTROESOPHAGEAL REFLUX, NO ESOPHAGITIS 05/19/2006   AMENORRHEA 05/19/2006    Scot Jun, PT, DPT, OCS, ATC 11/25/20  2:27 PM    [facility] [address] [place], [place], [address] Phone: [phone]   Fax:  [phone]  Name: Leslie Davis MRN: [id number] Date of Birth: 1955/08/11

## 2020-11-25 NOTE — Chronic Care Management (AMB) (Signed)
Chronic Care Management Pharmacy Assistant   Name: Leslie Davis  MRN: [id number] DOB: 10/18/1955   Reason for Encounter: Medication Review/ Medication coordination   Recent office visits:  11-20-2020 [name], MD. Glucose= 113, Creatinine= 1.27, eGFR= 46, Alkaline= 289. Microalb/Creat ratio= 602. Spec grav, UA >=1.030, Protein, UA= positive. Vitamin D= 102. STOP Tessalon, Keflex, Hydromet, Magnesium, Robaxin, Zofran and bactrim.  Recent consult visits:  11-11-2020 Nathaniel Man (Orthopedic). Post op follow up on left knee replacement.  11-12-2020 Jaye Beagle, PT (Physical therapy). Physical therapy for left knee.  11-14-2020 Girtha Rm, PT (Physical therapy). Physical therapy for left knee.  11-17-2020 Laureen Abrahams, PT (Physical therapy). Physical therapy for left knee.  11-19-2020 Laureen Abrahams, PT (Physical therapy). Physical therapy for left knee.  Hospital visits:  None in previous 6 months  Medications: Outpatient Encounter Medications as of 11/25/2020  Medication Sig   amLODipine (NORVASC) 10 MG tablet Take 1 tablet (10 mg total) by mouth daily.   aspirin EC 81 MG tablet Take 1 tablet (81 mg total) by mouth 2 (two) times daily. To be taken after surgery   Azilsartan Medoxomil (EDARBI) 80 MG TABS TAKE 1 TABLET EACH DAY.   Blood Glucose Monitoring Suppl (ONETOUCH VERIO) w/Device KIT Use as directed to check blood sugars 2 times per day dx: e11.65   cetirizine (ZYRTEC) 10 MG tablet Take 1 tablet (10 mg total) by mouth daily.   Cholecalciferol (VITAMIN D3) 1.25 MG (50000 UT) CAPS Take 1 capsule by mouth every 2 wks (Patient taking differently: Take 50,000 Units by mouth every 14 (fourteen) days.)   dapagliflozin propanediol (FARXIGA) 10 MG TABS tablet Take 1 tablet (10 mg total) by mouth daily before breakfast.   docusate sodium (COLACE) 100 MG capsule Take 1 capsule (100 mg total) by mouth daily as needed. (Patient taking  differently: Take 100 mg by mouth daily as needed for mild constipation.)   ferrous gluconate (FERGON) 324 MG tablet TAKE 1 TABLET BY MOUTH TWICE DAILY WITH A MEAL. (Patient taking differently: Take 324 mg by mouth 2 (two) times daily with a meal.)   glucose blood (ONETOUCH VERIO) test strip Use as instructed to check blood sugars 2 times per day dx:e11.65   insulin glargine, 1 Unit Dial, (TOUJEO SOLOSTAR) 300 UNIT/ML Solostar Pen Inject 35 units subcutaneous at bedtime   insulin lispro (HUMALOG KWIKPEN) 100 UNIT/ML KwikPen Sliding scale (Patient taking differently: Inject 4-8 Units into the skin daily as needed (CBG over 150). Sliding scale)   Insulin Pen Needle 32G X 6 MM MISC Use with pen injectors daily dx code: e11.9   linaclotide ([name]) 145 MCG CAPS capsule Take 1 capsule (145 mcg total) by mouth daily.   OneTouch Delica Lancets 84T MISC Use as directed to check blood sugars 2 times per day dx:e11.65   oxyCODONE-acetaminophen (PERCOCET) 5-325 MG tablet Take 1 tablet by mouth every 12 (twelve) hours as needed. To be taken after surgery   pantoprazole (PROTONIX) 40 MG tablet Take one tablet by mouth 3 days a week (Patient taking differently: Take 40 mg by mouth 3 (three) times a week.)   rosuvastatin (CRESTOR) 20 MG tablet Take 1 tablet (20 mg total) by mouth daily.   Semaglutide, 1 MG/DOSE, (OZEMPIC, 1 MG/DOSE,) 4 MG/3ML SOPN Inject 1 mg into the skin once a week. (Patient taking differently: Inject 1 mg into the skin once a week. Tuesday)   temazepam (RESTORIL) 30 MG capsule One capsules po qhs  prn (Patient taking differently: Take 30 mg by mouth at bedtime as needed for sleep.)   No facility-administered encounter medications on file as of 11/25/2020.   Reviewed chart for medication changes ahead of medication coordination call.  No OVs, Consults, or hospital visits since last care coordination call/Pharmacist visit. (If appropriate, list visit date, provider name)  No medication changes  indicated OR if recent visit, treatment plan here.  BP Readings from Last 3 Encounters:  11/20/20 128/68  10/28/20 132/89  10/22/20 (!) 165/62    Lab Results  Component Value Date   HGBA1C 6.3 (H) 10/22/2020     Patient obtains medications through Vials  90 Days   Last adherence delivery included:  Cetirizine 10 mg Protonix 40 mg Ferrous Gluconate  Patient declined (meds) last month: [name] 145 mcg- Due to receiving a 90 DS on 10-22-2020  Patient is due for next adherence delivery on: 12-04-2020  Called patient and reviewed medications and coordinated delivery.  This delivery to include: Trueplus Pen 31g 34m- Use with pen to inject insulin daily. Rosuvastatin 20 mg- 1 tablet daily Edarbi 80 mg- 1 tablet daily  Amlodipine 10 mg -1 tablet daily Docusate sodium 100 mg Temazepam 30 mg  No short/ acute fills needed  Patient declined the following medications: None  Patient needs refills for: Amlodipine 10 mg Docusate sodium 100 mg Trueplus pen Temazepam 30 mg  Confirmed delivery date of 12-04-2020, advised patient that pharmacy will contact them the morning of delivery.   Care Gaps: PCV13 overdue Moderna booster overdue Ophthalmology exam overdue RAF= 0.755% Medicare wellness 11-27-2020  Star Rating Drugs: Rosuvastatin 20 MG- Last filled 09-04-2020 90 DS [facility] Ozempic 1 MG- Last filled 09-02-2020 84 DS [facility] Azilsartan 80 MG- Last filled 09-04-2020 90 DS [facility]  MJohnsonClinical Pharmacist Assistant [phone]

## 2020-11-27 ENCOUNTER — Encounter: Payer: Self-pay | Admitting: Rehabilitative and Restorative Service Providers"

## 2020-11-27 ENCOUNTER — Other Ambulatory Visit: Payer: Self-pay

## 2020-11-27 ENCOUNTER — Ambulatory Visit: Payer: Medicare Other

## 2020-11-27 ENCOUNTER — Ambulatory Visit (INDEPENDENT_AMBULATORY_CARE_PROVIDER_SITE_OTHER): Payer: Medicare Other | Admitting: Rehabilitative and Restorative Service Providers"

## 2020-11-27 ENCOUNTER — Telehealth: Payer: Self-pay

## 2020-11-27 DIAGNOSIS — M6281 Muscle weakness (generalized): Secondary | ICD-10-CM | POA: Diagnosis not present

## 2020-11-27 DIAGNOSIS — R2689 Other abnormalities of gait and mobility: Secondary | ICD-10-CM

## 2020-11-27 DIAGNOSIS — M25562 Pain in left knee: Secondary | ICD-10-CM | POA: Diagnosis not present

## 2020-11-27 DIAGNOSIS — G8929 Other chronic pain: Secondary | ICD-10-CM

## 2020-11-27 DIAGNOSIS — R600 Localized edema: Secondary | ICD-10-CM | POA: Diagnosis not present

## 2020-11-27 DIAGNOSIS — M25662 Stiffness of left knee, not elsewhere classified: Secondary | ICD-10-CM

## 2020-11-27 NOTE — Therapy (Signed)
[facility] [address] [place], [place], [address] Phone: [phone]   Fax:  [phone]  Physical Therapy Treatment  Patient Details  Name: DARENDA Davis MRN: [id number] Date of Birth: Jul 28, 1955 Referring Provider (PT): Frankey Shown MD   Encounter Date: 11/27/2020   PT End of Session - 11/27/20 0758     Visit Number 6    Number of Visits 16    Date for PT Re-Evaluation 01/07/21    Authorization Type UHC MCR    Progress Note Due on Visit 10    PT Start Time 0759    PT Stop Time 0850    PT Time Calculation (min) 51 min    Activity Tolerance Patient tolerated treatment well    Behavior During Therapy [facility] for tasks assessed/performed             Past Medical History:  Diagnosis Date   DM (diabetes mellitus) ([place])    Fatty liver disease, nonalcoholic    Gastritis    Gastroparesis    GERD (gastroesophageal reflux disease)    Heartburn    Hiatal hernia    HLD (hyperlipidemia)    HTN (hypertension)    Joint pain    Renal calculus    Rheumatoid arthritis ([place])    Vitamin D deficiency     Past Surgical History:  Procedure Laterality Date   CHOLECYSTECTOMY     COLONOSCOPY     TOTAL KNEE ARTHROPLASTY Left 10/27/2020   Procedure: LEFT TOTAL KNEE ARTHROPLASTY;  Surgeon: [name], MD;  Location: [facility];  Service: Orthopedics;  Laterality: Left;   TUBAL LIGATION     UPPER GASTROINTESTINAL ENDOSCOPY      There were no vitals filed for this visit.   Subjective Assessment - 11/27/20 0802     Subjective Pt. indicated 7/10 today.  Pt .stated she went to [employer] and felt like she over did it walking too much.    Pertinent History DM, HTN    Patient Stated Goals Get back to my old self; no walker    Currently in Pain? Yes    Pain Location Knee    Pain Orientation Left    Pain Descriptors / Indicators Aching;Tightness;Sore    Pain Type Surgical pain    Pain Onset 1 to 4 weeks ago    Pain Frequency Constant    Aggravating  Factors  walking prolonged at [employer]    Pain Relieving Factors resting                [facility] PT Assessment - 11/27/20 0001       Assessment   Medical Diagnosis s/p Lt TKR    Referring Provider (PT) Frankey Shown MD    Onset Date/Surgical Date 10/27/20    Hand Dominance Right      AROM   Overall AROM Comments Lacking 10 degrees in supine SLR TKE    Left Knee Flexion 95   in supine heel slide     PROM   Left Knee Extension 0    Left Knee Flexion 100   in supine heel slide over pressure                          [facility] Adult PT Treatment/Exercise - 11/27/20 0001       Neuro Re-ed    Neuro Re-ed Details  tandem stance 1 min x 2 bilateral c mild to moderate hand assist corrections  Knee/Hip Exercises: Stretches   Gastroc Stretch 3 reps;Both;30 seconds   incline board   Other Knee/Hip Stretches supine heel slide 2 sec hold x 10 Lt      Knee/Hip Exercises: Aerobic   Recumbent Bike Partial circles 6 mins - seat 6      Knee/Hip Exercises: Machines for Strengthening   Cybex Leg Press 25 lbs Lt 2 x 10      Knee/Hip Exercises: Supine   Straight Leg Raises 2 sets;5 reps;Left      Vasopneumatic   Number Minutes Vasopneumatic  10 minutes    Vasopnuematic Location  Knee   Lt   Vasopneumatic Pressure Medium    Vasopneumatic Temperature  34      Manual Therapy   Manual therapy comments seated Lt knee flexion c distraction/IR mobilization c movement c contralateral leg movement opposite.  Contract/relax MET for Lt knee flexion gains                     PT Education - 11/27/20 0822     Education Details updated HEP c focus on TKE gains    Person(s) Educated Patient    Methods Explanation;Demonstration;Verbal cues;Handout    Comprehension Verbalized understanding;Returned demonstration              PT Short Term Goals - 11/27/20 0825       PT SHORT TERM GOAL #1   Title Pt will be I and compliant with HEP.    Time 4    Period Weeks     Status Achieved      PT SHORT TERM GOAL #2   Title Pt able to safely ambulate community distances without AD    Baseline -    Time 4    Period Weeks    Status Partially Met               PT Long Term Goals - 11/27/20 0825       PT LONG TERM GOAL #1   Title Pt will improve FOTO to >= 70    Baseline FS = 52 at intake    Time 8    Period Weeks    Status On-going    Target Date 01/07/21      PT LONG TERM GOAL #2   Title Pt will improve Rt knee  AROM to 3-120 deg to normalize gait and transfers.    Time 8    Period Weeks    Status On-going    Target Date 01/07/21      PT LONG TERM GOAL #3   Title Pt will improve Lt hip/knee strength to overall 4+ to improve function.    Baseline -    Time 8    Period Weeks    Status On-going    Target Date 01/07/21      PT LONG TERM GOAL #4   Title Pt will report less than 3-4/10 pain with standing/walking at least 30 minutes and negotiating her 3 steps to enter her house.    Baseline -    Time 8    Period Weeks    Status On-going    Target Date 01/07/21                   Plan - 11/27/20 4562     Clinical Impression Statement Pt. continued to present c need for skilled PT services and HEP to progress overall mobility.  Of note, quad strength deficits and TKE lacking noted  in ambulation and other functional movements.  Adjusted HEP to address.    Personal Factors and Comorbidities Comorbidity 2;Past/Current Experience    Comorbidities DM, HTN    Examination-Activity Limitations Locomotion Level;Transfers    Examination-Participation Restrictions Cleaning;Other   working out at gym   Stability/Clinical Decision Making Stable/Uncomplicated    Rehab Potential Excellent    PT Frequency 2x / week    PT Duration 8 weeks    PT Treatment/Interventions ADLs/Self Care Home Management;Aquatic Therapy;Cryotherapy;Electrical Stimulation;Moist Heat;Balance training;Therapeutic exercise;Therapeutic activities;Functional mobility  training;Stair training;Gait training;Patient/family education;Manual techniques;Passive range of motion;Scar mobilization;Taping;Vasopneumatic Device    PT Next Visit Plan Quad strength, TKEimprovements.    PT Home Exercise Plan [email or web address]    Consulted and Agree with Plan of Care Patient             Patient will benefit from skilled therapeutic intervention in order to improve the following deficits and impairments:  Abnormal gait, Decreased range of motion, Pain, Impaired flexibility, Decreased balance, Decreased strength, Increased edema, Postural dysfunction  Visit Diagnosis: Stiffness of left knee, not elsewhere classified  Chronic pain of left knee  Other abnormalities of gait and mobility  Localized edema  Muscle weakness (generalized)     Problem List Patient Active Problem List   Diagnosis Date Noted   Status post total left knee replacement 10/27/2020   Primary osteoarthritis of left knee    NAFLD (nonalcoholic fatty liver disease) 08/06/2020   Other fatigue 07/23/2020   SOBOE (shortness of breath on exertion) 07/23/2020   Diabetes mellitus ([place]) 07/23/2020   Hypertension associated with type 2 diabetes mellitus ([place]) 07/23/2020   Hyperlipidemia associated with type 2 diabetes mellitus ([place]) 07/23/2020   Absolute anemia 07/23/2020   Vitamin D deficiency 07/23/2020   At risk for heart disease 07/23/2020   AKI (acute kidney injury) ([place]) 09/07/2019   Acute kidney injury ([place]) 09/06/2019   Bradycardia 09/06/2019   Hyperglycemia 09/06/2019   HLD (hyperlipidemia)    Hypertensive nephropathy 05/16/2019   Erosive gastritis 11/16/2017   Fatty liver 11/16/2017   Abdominal pain 12/11/2012   Early satiety 12/11/2012   Hiatal hernia    Hx of cholecystectomy 02/05/2011   Flu-like symptoms 02/05/2011   CONSTIPATION 08/27/2008   FATTY LIVER DISEASE 05/07/2008   Nausea with vomiting 04/23/2008   EPIGASTRIC PAIN 04/23/2008   RENAL CALCULUS 04/22/2008   Type 2  diabetes mellitus with stage 2 chronic kidney disease, with long-term current use of insulin ([place]) 05/19/2006   Type 2 diabetes mellitus with hyperlipidemia ([place]) 05/19/2006   HYPERTENSION, BENIGN SYSTEMIC 05/19/2006   GASTROESOPHAGEAL REFLUX, NO ESOPHAGITIS 05/19/2006   AMENORRHEA 05/19/2006   Scot Jun, PT, DPT, OCS, ATC 11/27/20  8:35 AM    [facility] [address] [place], [place], [address] Phone: [phone]   Fax:  [phone]  Name: SHARELLE BURDITT MRN: [id number] Date of Birth: [birth year]

## 2020-11-27 NOTE — Patient Instructions (Signed)
Access Code: [email or web address] URL: [email or web address] Date: 11/27/2020 Prepared by: Scot Jun  Exercises Supine Quad Set - 3 x daily - 7 x weekly - 1 sets - 10 reps - 10 hold Supine Heel Slide with Strap - 3 x daily - 7 x weekly - 1 sets - 10 reps - 10 hold Seated Knee Flexion Extension AROM - 3 x daily - 7 x weekly - 1 sets - 10 reps Seated Long Arc Quad - 3 x daily - 7 x weekly - 1 sets - 10 reps Small Range Straight Leg Raise - 2 x daily - 7 x weekly - 10 reps - 3 sets Sit to Stand - 1 x daily - 7 x weekly - 3 sets - 10 reps Seated Straight Leg Heel Taps - 1 x daily - 7 x weekly - 3 sets - 10 reps

## 2020-11-27 NOTE — Telephone Encounter (Signed)
This nurse called patient in regards to missed AWV. She stated that she did not know that it was in person. Rescheduled for 12/17/2020.

## 2020-11-28 ENCOUNTER — Encounter: Payer: Medicare Other | Admitting: Physical Therapy

## 2020-12-01 ENCOUNTER — Ambulatory Visit (INDEPENDENT_AMBULATORY_CARE_PROVIDER_SITE_OTHER): Payer: Medicare Other | Admitting: Family Medicine

## 2020-12-01 ENCOUNTER — Encounter (INDEPENDENT_AMBULATORY_CARE_PROVIDER_SITE_OTHER): Payer: Self-pay | Admitting: Family Medicine

## 2020-12-01 ENCOUNTER — Other Ambulatory Visit: Payer: Self-pay | Admitting: Internal Medicine

## 2020-12-01 ENCOUNTER — Other Ambulatory Visit: Payer: Self-pay

## 2020-12-01 VITALS — BP 138/60 | HR 70 | Temp 98.2°F | Ht 64.0 in | Wt 184.0 lb

## 2020-12-01 DIAGNOSIS — K5900 Constipation, unspecified: Secondary | ICD-10-CM | POA: Diagnosis not present

## 2020-12-01 DIAGNOSIS — E1169 Type 2 diabetes mellitus with other specified complication: Secondary | ICD-10-CM

## 2020-12-01 DIAGNOSIS — E559 Vitamin D deficiency, unspecified: Secondary | ICD-10-CM | POA: Diagnosis not present

## 2020-12-01 DIAGNOSIS — Z6835 Body mass index (BMI) 35.0-35.9, adult: Secondary | ICD-10-CM

## 2020-12-01 DIAGNOSIS — E66812 Obesity, class 2: Secondary | ICD-10-CM | POA: Insufficient documentation

## 2020-12-01 DIAGNOSIS — I152 Hypertension secondary to endocrine disorders: Secondary | ICD-10-CM | POA: Diagnosis not present

## 2020-12-01 DIAGNOSIS — R801 Persistent proteinuria, unspecified: Secondary | ICD-10-CM

## 2020-12-01 DIAGNOSIS — Z96652 Presence of left artificial knee joint: Secondary | ICD-10-CM

## 2020-12-01 DIAGNOSIS — E6609 Other obesity due to excess calories: Secondary | ICD-10-CM | POA: Insufficient documentation

## 2020-12-01 DIAGNOSIS — E1159 Type 2 diabetes mellitus with other circulatory complications: Secondary | ICD-10-CM

## 2020-12-01 DIAGNOSIS — Z794 Long term (current) use of insulin: Secondary | ICD-10-CM | POA: Diagnosis not present

## 2020-12-01 MED ORDER — VITAMIN D3 1.25 MG (50000 UT) PO CAPS: ORAL_CAPSULE | ORAL | 0 refills | Status: DC

## 2020-12-01 NOTE — Chronic Care Management (AMB) (Signed)
Chronic Care Management   CCM RN Visit Note  11/25/2020 Name: Leslie Davis MRN: [id number] DOB: 24-Feb-1956  Subjective: Leslie Davis is a 65 y.o. year old female who is a primary care patient of [name], MD. The care management team was consulted for assistance with disease management and care coordination needs.    Engaged with patient by telephone for follow up visit in response to provider referral for case management and/or care coordination services.   Consent to Services:  The patient was given information about Chronic Care Management services, agreed to services, and gave verbal consent prior to initiation of services.  Please see initial visit note for detailed documentation.   Patient agreed to services and verbal consent obtained.   Assessment: Review of patient past medical history, allergies, medications, health status, including review of consultants reports, laboratory and other test data, was performed as part of comprehensive evaluation and provision of chronic care management services.   SDOH (Social Determinants of Health) assessments and interventions performed:    CCM Care Plan  Allergies  Allergen Reactions   Fiasp [Insulin Aspart (W-Niacinamide)] Hives    Outpatient Encounter Medications as of 11/25/2020  Medication Sig   amLODipine (NORVASC) 10 MG tablet Take 1 tablet (10 mg total) by mouth daily.   aspirin EC 81 MG tablet Take 1 tablet (81 mg total) by mouth 2 (two) times daily. To be taken after surgery   Azilsartan Medoxomil (EDARBI) 80 MG TABS TAKE 1 TABLET EACH DAY.   Blood Glucose Monitoring Suppl (ONETOUCH VERIO) w/Device KIT Use as directed to check blood sugars 2 times per day dx: e11.65   cetirizine (ZYRTEC) 10 MG tablet Take 1 tablet (10 mg total) by mouth daily.   Cholecalciferol (VITAMIN D3) 1.25 MG (50000 UT) CAPS Take 1 capsule by mouth every 2 wks (Patient taking differently: Take 50,000 Units by mouth every 14 (fourteen) days.)    dapagliflozin propanediol (FARXIGA) 10 MG TABS tablet Take 1 tablet (10 mg total) by mouth daily before breakfast.   docusate sodium (COLACE) 100 MG capsule Take 1 capsule (100 mg total) by mouth daily as needed. (Patient taking differently: Take 100 mg by mouth daily as needed for mild constipation.)   ferrous gluconate (FERGON) 324 MG tablet TAKE 1 TABLET BY MOUTH TWICE DAILY WITH A MEAL. (Patient taking differently: Take 324 mg by mouth 2 (two) times daily with a meal.)   glucose blood (ONETOUCH VERIO) test strip Use as instructed to check blood sugars 2 times per day dx:e11.65   insulin glargine, 1 Unit Dial, (TOUJEO SOLOSTAR) 300 UNIT/ML Solostar Pen Inject 35 units subcutaneous at bedtime   insulin lispro (HUMALOG KWIKPEN) 100 UNIT/ML KwikPen Sliding scale (Patient taking differently: Inject 4-8 Units into the skin daily as needed (CBG over 150). Sliding scale)   Insulin Pen Needle 32G X 6 MM MISC Use with pen injectors daily dx code: e11.9   linaclotide ([name]) 145 MCG CAPS capsule Take 1 capsule (145 mcg total) by mouth daily.   OneTouch Delica Lancets 38G MISC Use as directed to check blood sugars 2 times per day dx:e11.65   oxyCODONE-acetaminophen (PERCOCET) 5-325 MG tablet Take 1 tablet by mouth every 12 (twelve) hours as needed. To be taken after surgery   pantoprazole (PROTONIX) 40 MG tablet Take one tablet by mouth 3 days a week (Patient taking differently: Take 40 mg by mouth 3 (three) times a week.)   rosuvastatin (CRESTOR) 20 MG tablet Take 1 tablet (20 mg total)  by mouth daily.   Semaglutide, 1 MG/DOSE, (OZEMPIC, 1 MG/DOSE,) 4 MG/3ML SOPN Inject 1 mg into the skin once a week. (Patient taking differently: Inject 1 mg into the skin once a week. Tuesday)   temazepam (RESTORIL) 30 MG capsule One capsules po qhs prn (Patient taking differently: Take 30 mg by mouth at bedtime as needed for sleep.)   No facility-administered encounter medications on file as of 11/25/2020.    Patient  Active Problem List   Diagnosis Date Noted   Status post total left knee replacement 10/27/2020   Primary osteoarthritis of left knee    NAFLD (nonalcoholic fatty liver disease) 08/06/2020   Other fatigue 07/23/2020   SOBOE (shortness of breath on exertion) 07/23/2020   Diabetes mellitus ([place]) 07/23/2020   Hypertension associated with type 2 diabetes mellitus ([place]) 07/23/2020   Hyperlipidemia associated with type 2 diabetes mellitus ([place]) 07/23/2020   Absolute anemia 07/23/2020   Vitamin D deficiency 07/23/2020   At risk for heart disease 07/23/2020   AKI (acute kidney injury) ([place]) 09/07/2019   Acute kidney injury ([place]) 09/06/2019   Bradycardia 09/06/2019   Hyperglycemia 09/06/2019   HLD (hyperlipidemia)    Hypertensive nephropathy 05/16/2019   Erosive gastritis 11/16/2017   Fatty liver 11/16/2017   Abdominal pain 12/11/2012   Early satiety 12/11/2012   Hiatal hernia    Hx of cholecystectomy 02/05/2011   Flu-like symptoms 02/05/2011   CONSTIPATION 08/27/2008   FATTY LIVER DISEASE 05/07/2008   Nausea with vomiting 04/23/2008   EPIGASTRIC PAIN 04/23/2008   RENAL CALCULUS 04/22/2008   Type 2 diabetes mellitus with stage 2 chronic kidney disease, with long-term current use of insulin ([place]) 05/19/2006   Type 2 diabetes mellitus with hyperlipidemia ([place]) 05/19/2006   HYPERTENSION, BENIGN SYSTEMIC 05/19/2006   GASTROESOPHAGEAL REFLUX, NO ESOPHAGITIS 05/19/2006   AMENORRHEA 05/19/2006    Conditions to be addressed/monitored: Type 2 diabetes mellitus, Hypertensive nephropathy, Chronic renal failure, Primary Osteoarthritis   Care Plan : Hypertension (Adult)  Updates made by Lynne Logan, RN since 11/25/2020 12:00 AM     Problem: Hypertension (Hypertension)   Priority: High     Long-Range Goal: Hypertension Monitored   Start Date: 04/21/2020  Expected End Date: 04/21/2021  Recent Progress: On track  Priority: High  Note:   Objective:  Last practice recorded BP readings:   BP Readings from Last 3 Encounters:  11/20/20 128/68  10/28/20 132/89  10/22/20 (!) 165/62   Most recent eGFR/CrCl:  Lab Results  Component Value Date   EGFR 46 (L) 11/20/2020    No components found for: CRCL Current Barriers:  Knowledge Deficits related to basic understanding of hypertension pathophysiology and self care management Knowledge Deficits related to understanding of medications prescribed for management of hypertension Case Manager Clinical Goal(s):  Patient will demonstrate improved adherence to prescribed treatment plan for hypertension as evidenced by taking all medications as prescribed, monitoring and recording blood pressure as directed, adhering to low sodium/DASH diet Interventions:  11/25/20 completed successful outbound call with patient  Collaboration with [name], MD regarding development and update of comprehensive plan of care as evidenced by provider attestation and co-signature Inter-disciplinary care team collaboration (see longitudinal plan of care) Evaluation of current treatment plan related to hypertension self management and patient's adherence to plan as established by provider. Provided education to patient re: stroke prevention, s/s of heart attack and stroke, DASH diet, complications of uncontrolled blood pressure Reviewed medications with patient and discussed importance of compliance Advised patient, providing education and  rationale, to monitor blood pressure daily and record, calling PCP for findings outside established parameters.  Discussed plans with patient for ongoing care management follow up and provided patient with direct contact information for care management team Patient Goals/Self-Care Activities Self administers medications as prescribed Attends all scheduled provider appointments Calls provider office for new concerns, questions, or BP outside discussed parameters Checks BP and records as discussed Follows a low sodium  diet/DASH diet  Follow Up Plan: Telephone follow up appointment with care management team member scheduled for: 03/02/21    Care Plan : Osteoarthritis (Adult)  Updates made by Lynne Logan, RN since 11/25/2020 12:00 AM     Problem: Mobility and Function (Osteoarthritis)   Priority: High     Long-Range Goal: Maintain Mobility and Function   Start Date: 09/19/2020  Expected End Date: 09/21/2021  Recent Progress: On track  Priority: High  Note:   Current Barriers:  Ineffective Self Health Maintenance Currently UNABLE TO independently self manage needs related to chronic health conditions.  Knowledge Deficits related to short term plan for care coordination needs and long term plans for chronic disease management needs Case Manager Clinical Goal(s):  Collaboration with [name], MD regarding development and update of comprehensive plan of care as evidenced by provider attestation and co-signature Inter-disciplinary care team collaboration (see longitudinal plan of care) Patient will work with care management team to address care coordination and chronic disease management needs related to Disease Management Educational Needs Care Coordination Medication Management and Education Psychosocial Support   Interventions:  11/25/20 Successful outbound call completed with patient  Assessed for adherence to prescribed treatment plan by provider for Osteoarthritis Assessed impact of pain, loss of strength and potential disfigurement (hand osteoarthritis) on mental health and daily life, such as social isolation, anxiety, depression, impaired sexual relationship and  or injury from falls Determined patient completed left knee replacement surgery on 10/27/20 per Dr. Erlinda Hong, her surgical wound has healed  Discussed and reviewed her post operative recovery is going well, she is receiving outpatient PT via [facility] as directed Reviewed and discussed recent Assessment/Plan for PT:  Plan - 11/19/20  1513       Clinical Impression Statement Pt tolerated session well today with initiation of weight machines today.  Will continue to benefit from PT to maximize function.     Personal Factors and Comorbidities Comorbidity 2;Past/Current Experience     Comorbidities DM, HTN     Examination-Activity Limitations Locomotion Level;Transfers     Examination-Participation Restrictions Cleaning;Other   working out at gym    Stability/Clinical Decision Making Stable/Uncomplicated     Rehab Potential Excellent     PT Frequency 2x / week     PT Duration 8 weeks     PT Treatment/Interventions ADLs/Self Care Home Management;Aquatic Therapy;Cryotherapy;Electrical Stimulation;Moist Heat;Balance training;Therapeutic exercise;Therapeutic activities;Functional mobility training;Stair training;Gait training;Patient/family education;Manual techniques;Passive range of motion;Scar mobilization;Taping;Vasopneumatic Device     PT Next Visit Plan Manual for mobility gains, active strengthening progression.  static balance, continue flexion focus     PT Home Exercise Plan [id number]     Consulted and Agree with Plan of Care Patient      Patient will benefit from skilled therapeutic intervention in order to improve the following deficits and impairments:  Abnormal gait, Decreased range of motion, Pain, Impaired flexibility, Decreased balance, Decreased strength, Increased edema, Postural dysfunction Assessed for barriers to participation in therapy or exercise, such as pain with activity, anticipated or imagined pain, no barriers identified Discussed plans  with patient for ongoing care management follow up and provided patient with direct contact information for care management team Patient Goals/Self Care Activities:   Continue to follow HEP as directed Keep all MD and outpatient PT follow up appointments as scheduled Take pain medication exactly as prescribed for left knee pain  Follow Up Plan: Telephone follow up  appointment with care management team member scheduled for: 03/02/21    Care Plan : Diabetes Type 2 (Adult)  Updates made by Lynne Logan, RN since 11/25/2020 12:00 AM     Problem: Glycemic Management (Diabetes, Type 2)   Priority: Medium     Long-Range Goal: Glycemic Management Optimized   Start Date: 09/19/2020  Expected End Date: 09/21/2021  Recent Progress: On track  Priority: Medium  Note:   Objective:  Lab Results  Component Value Date   HGBA1C 6.3 (H) 10/22/2020   Lab Results  Component Value Date   CREATININE 1.27 (H) 11/20/2020   CREATININE 1.00 10/28/2020   CREATININE 1.30 (H) 10/22/2020   Lab Results  Component Value Date   EGFR 46 (L) 11/20/2020  Current Barriers:  Knowledge Deficits related to basic Diabetes pathophysiology and self care/management Knowledge Deficits related to medications used for management of diabetes Case Manager Clinical Goal(s):  patient will demonstrate improved adherence to prescribed treatment plan for diabetes self care/management as evidenced by: daily monitoring and recording of CBG  adherence to ADA/ carb modified diet exercise 5 days/week adherence to prescribed medication regimen contacting provider for new or worsened symptoms or questions Interventions:  11/25/20 completed successful outbound call with patient  Collaboration with [name], MD regarding development and update of comprehensive plan of care as evidenced by provider attestation and co-signature Inter-disciplinary care team collaboration (see longitudinal plan of care) Provided education to patient about basic DM disease process Review of patient status, including review of consultant's reports, relevant laboratory and other test results, and medications completed. Reviewed medications with patient and discussed importance of medication adherence Educated patient on dietary and exercise recommendations; daily glycemic control FBS 80-130, <180 after meals;15'15'  rule Advised patient, providing education and rationale, to check cbg daily before meals and at bedtime and record, calling the CCM team and or PCP for findings outside established parameters Determined patient experienced elevated blood glucose readings post operatively following knee replacement, however this has resolved and cbg's are within target range  Mailed printed educational materials related to Low Carb Smoothies; Mediterranean diet, Carb Choices  Discussed plans with patient for ongoing care management follow up and provided patient with direct contact information for care management team Self-Care Activities Self administers oral medications as prescribed Self administers insulin as prescribed Attends all scheduled provider appointments Checks blood sugars as prescribed and utilize hyper and hypoglycemia protocol as needed Adheres to prescribed ADA/carb modified Patient Goals: - check blood sugar at prescribed times - check blood sugar before and after exercise - check blood sugar if I feel it is too high or too low - enter blood sugar readings and medication or insulin into daily log - take the blood sugar log to all doctor visits - take the blood sugar meter to all doctor visits - manage portion size - keep appointment with eye doctor - schedule appointment with eye doctor - check feet daily for cuts, sores or redness - do heel pump exercise 2 to 3 times each day - keep feet up while sitting - trim toenails straight across - wash and dry feet carefully every day -  wear comfortable, cotton socks - wear comfortable, well-fitting shoes  Follow Up Plan: Telephone follow up appointment with care management team member scheduled for: 03/02/21      Plan:Telephone follow up appointment with care management team member scheduled for:  03/02/21  Barb Merino, RN, BSN, CCM Care Management Coordinator [facility]  Direct Phone:  [phone]

## 2020-12-01 NOTE — Patient Instructions (Signed)
Goals Addressed      Maintain Mobility and Function   On track    Timeframe:  Long-Range Goal Priority:  Medium Start Date: 09/19/20                            Expected End Date: 09/21/21  Next Scheduled follow up: 03/02/21        Patient Goals/Self Care Activities:   Continue to follow HEP as directed Keep all MD and outpatient PT follow up appointments as scheduled Take pain medication exactly as prescribed for left knee pain                    Monitor and Manage My Blood Sugar-Diabetes Type 2   On track    Timeframe:  Long-Range Goal Priority:  Medium Start Date: 09/19/20                            Expected End Date: 09/19/21                      Follow Up Date: 03/02/21    - check blood sugar at prescribed times - check blood sugar before and after exercise - check blood sugar if I feel it is too high or too low - enter blood sugar readings and medication or insulin into daily log - take the blood sugar log to all doctor visits - take the blood sugar meter to all doctor visits    Why is this important?   Checking your blood sugar at home helps to keep it from getting very high or very low.  Writing the results in a diary or log helps the doctor know how to care for you.  Your blood sugar log should have the time, date and the results.  Also, write down the amount of insulin or other medicine that you take.  Other information, like what you ate, exercise done and how you were feeling, will also be helpful.     Notes:      Track and Manage My Blood Pressure-Hypertension   On track    Timeframe:  Long-Range Goal Priority:  High Start Date: 04/21/20                            Expected End Date: 04/21/21                      Follow Up Date: 03/02/21  Self administers medications as prescribed Attends all scheduled provider appointments Calls provider office for new concerns, questions, or BP outside discussed parameters Checks BP and records as discussed Follows a low sodium  diet/DASH diet   Why is this important?   You won't feel high blood pressure, but it can still hurt your blood vessels.  High blood pressure can cause heart or kidney problems. It can also cause a stroke.  Making lifestyle changes like losing a [place]bi [place]ount weight or eating less salt will help.  Checking your blood pressure at home and at different times of the day can help to control blood pressure.  If the doctor prescribes medicine remember to take it the way the doctor ordered.  Call the office if you cannot afford the medicine or if there are questions about it.     Notes:

## 2020-12-02 ENCOUNTER — Other Ambulatory Visit: Payer: Self-pay | Admitting: Physician Assistant

## 2020-12-02 ENCOUNTER — Telehealth: Payer: Self-pay | Admitting: *Deleted

## 2020-12-02 MED ORDER — HYDROCODONE-ACETAMINOPHEN 5-325 MG PO TABS: 1.0000 | ORAL_TABLET | Freq: Two times a day (BID) | ORAL | 0 refills | Status: DC | PRN

## 2020-12-02 NOTE — Telephone Encounter (Signed)
Patient called requesting assistance with a refill of pain medication. Looks like her last refill was on 11/21/20 and it was written for every 12 hours by [facility], but she actually continued taking them as she was. She is taking 2 at night and 2 during the day while attending OPPT. She can't get another refill per the pharmacy until next week and she is asking what she should do as she will need something before then. Thanks.

## 2020-12-02 NOTE — Progress Notes (Signed)
Chief Complaint:   OBESITY Leslie Davis is here to discuss her progress with her obesity treatment plan along with follow-up of her obesity related diagnoses. Leslie Davis is on the Category 1 Plan and states she is following her eating plan approximately 90% of the time. Leslie Davis states she is doing 0 minutes 0 times per week.  Today's visit was #: 8 Starting weight: 208 lbs Starting date: 07/23/2020 Today's weight: 184 lbs Today's date: 12/01/2020 Total lbs lost to date: 24 Total lbs lost since last in-office visit: 9  Interim History: Leslie Davis at all of the food on the plan after a couple of weeks. Her denies hunger or cravings. She feels that she has energy and is less tired. She found snacks less tempting.  Subjective:   1. Type 2 diabetes mellitus with other specified complication, with long-term current use of insulin (HCC) Leslie Davis's fasting BGs run at 82,102,108, and 84. She is taking 34 units of Lantus.  2. Acute on chronic constipation, unspecified constipation type Leslie Davis is taking [name] as needed every 2-3 days or so. She is drinking six 16.9 oz bottles of water daily.  3. Hypertension associated with type 2 diabetes mellitus ([place]) Leslie Davis's blood pressures range between [id number] systolic at home. She denies symptoms, and she feels it is elevated from pain. She is on Oxycodone currently for pain, and she rates her pain at a 6 out of 10.  4. Status post total left knee replacement Leslie Davis is doing physical therapy at her doctor's office. She had surgery on 10/27/2020.  5. Vitamin D deficiency Leslie Davis is currently taking prescription vitamin D 50,000 IU every 14 days. She denies nausea, vomiting or muscle weakness.  Assessment/Plan:  No orders of the defined types were placed in this encounter.   Medications Discontinued During This Encounter  Medication Reason   Cholecalciferol (VITAMIN D3) 1.25 MG (50000 UT) CAPS Reorder     Meds ordered this encounter  Medications    Cholecalciferol (VITAMIN D3) 1.25 MG (50000 UT) CAPS    Sig: Take 1 capsule by mouth every 2 wks    Dispense:  2 capsule    Refill:  0     1. Type 2 diabetes mellitus with other specified complication, with long-term current use of insulin ([place]) Leslie Davis agreed to decrease Lantus by only 2 units until her next office visit. She will continue her medications per her primary care physician or specialist, and will continue her prudent nutritional plan and weight loss. Good blood sugar control is important to decrease the likelihood of diabetic complications such as nephropathy, neuropathy, limb loss, blindness, coronary artery disease, and death. Intensive lifestyle modification including diet, exercise and weight loss are the first line of treatment for diabetes.   2. Acute on chronic constipation, unspecified constipation type Leslie Davis was informed that a decrease in bowel movement frequency is normal while losing weight, but stools should not be hard or painful. No need for a refill of [name] today. She will continue her water intake and follow her meal plan. Orders and follow up as documented in patient record.   Counseling Getting to Good Bowel Health: Your goal is to have one soft bowel movement each day. Drink at least 8 glasses of water each day. Eat plenty of fiber (goal is over 25 grams each day). It is best to get most of your fiber from dietary sources which includes leafy green vegetables, fresh fruit, and whole grains. You may need to add fiber with the help of  OTC fiber supplements. These include Metamucil, Citrucel, and Flaxseed. If you are still having trouble, try adding Miralax or Magnesium Citrate. If all of these changes do not work, Cabin crew.  3. Hypertension associated with type 2 diabetes mellitus ([place]) Leslie Davis's blood pressure was rechecked manually and is within normal limits, but not at goal due to pain initially. She will continue working on healthy weight  loss and exercise to improve blood pressure control. We will watch for signs of hypotension as she continues her lifestyle modifications.  4. Status post total left knee replacement Leslie Davis will continue her treatment plan per Ortho.  5. Vitamin D deficiency Low Vitamin D level contributes to fatigue and are associated with obesity, breast, and colon cancer. We will refill prescription Vitamin D 50,000 IU every 14 days for 1 month. Leslie Davis will follow-up for routine testing of Vitamin D, at least 2-3 times per year to avoid over-replacement.  - Cholecalciferol (VITAMIN D3) 1.25 MG (50000 UT) CAPS; Take 1 capsule by mouth every 2 wks  Dispense: 2 capsule; Refill: 0  6. Obesity with current BMI of 31.7 Leslie Davis is currently in the action stage of change. As such, her goal is to continue with weight loss efforts. She has agreed to the Category 1 Plan.   Exercise goals: As is per physical therapy and Ortho.  Behavioral modification strategies: avoiding temptations and planning for success.  Leslie Davis has agreed to follow-up with our clinic in 3 weeks. She was informed of the importance of frequent follow-up visits to maximize her success with intensive lifestyle modifications for her multiple health conditions.   Objective:   Blood pressure 138/60, pulse 70, temperature 98.2 F (36.8 C), height '5\' 4"'$  (1.626 m), weight 184 lb (83.5 kg), SpO2 99 %. Body mass index is 31.58 kg/m.  General: Cooperative, alert, well developed, in no acute distress. HEENT: Conjunctivae and lids unremarkable. Cardiovascular: Regular rhythm.  Lungs: Normal work of breathing. Neurologic: No focal deficits.   Lab Results  Component Value Date   CREATININE 1.27 (H) 11/20/2020   BUN 20 11/20/2020   NA 138 11/20/2020   K 4.8 11/20/2020   CL 102 11/20/2020   CO2 22 11/20/2020   Lab Results  Component Value Date   ALT 28 11/20/2020   AST 23 11/20/2020   ALKPHOS 289 (H) 11/20/2020   BILITOT <0.2 11/20/2020    Lab Results  Component Value Date   HGBA1C 6.3 (H) 10/22/2020   HGBA1C 6.8 (H) 08/25/2020   HGBA1C 8.7 (H) 05/28/2020   HGBA1C 8.8 (H) 02/21/2020   HGBA1C 10.5 (H) 11/15/2019   No results found for: INSULIN Lab Results  Component Value Date   TSH 1.790 07/23/2020   Lab Results  Component Value Date   CHOL 156 11/20/2020   HDL 61 11/20/2020   LDLCALC 78 11/20/2020   TRIG 89 11/20/2020   CHOLHDL 2.6 11/20/2020   Lab Results  Component Value Date   VD25OH 102.0 (H) 11/20/2020   VD25OH 95.2 10/22/2020   VD25OH 112.0 (H) 07/23/2020   Lab Results  Component Value Date   WBC 4.4 11/20/2020   HGB 11.7 11/20/2020   HCT 35.0 11/20/2020   MCV 84 11/20/2020   PLT 325 11/20/2020   Lab Results  Component Value Date   IRON 68 11/16/2017   TIBC 355 11/16/2017   FERRITIN 45.2 11/16/2017    Obesity Behavioral Intervention:   Approximately 15 minutes were spent on the discussion below.  ASK: We discussed the diagnosis of  obesity with Leslie Davis today and Leslie Davis agreed to give [place] permission to discuss obesity behavioral modification therapy today.  ASSESS: Leslie Davis has the diagnosis of obesity and her BMI today is 31.7. Leslie Davis is in the action stage of change.   ADVISE: Leslie Davis was educated on the multiple health risks of obesity as well as the benefit of weight loss to improve her health. She was advised of the need for long term treatment and the importance of lifestyle modifications to improve her current health and to decrease her risk of future health problems.  AGREE: Multiple dietary modification options and treatment options were discussed and Leslie Davis agreed to follow the recommendations documented in the above note.  ARRANGE: Leslie Davis was educated on the importance of frequent visits to treat obesity as outlined per CMS and USPSTF guidelines and agreed to schedule her next follow up appointment today.  Attestation Statements:   Reviewed by clinician on day of  visit: allergies, medications, problem list, medical history, surgical history, family history, social history, and previous encounter notes.   Leslie Davis, am acting as transcriptionist for [employer], DO.  I have reviewed the above documentation for accuracy and completeness, and I agree with the above. Leslie Davis, D.O.  The [place] was signed into law in 2016 which includes the topic of electronic health records.  This provides immediate access to information in MyChart.  This includes consultation notes, operative notes, office notes, lab results and pathology reports.  If you have any questions about what you read please let us know at your next visit so we can discuss your concerns and take corrective action if need be.  We are right here with you.

## 2020-12-02 NOTE — Telephone Encounter (Signed)
Thank you :)

## 2020-12-02 NOTE — Telephone Encounter (Signed)
Sent in norco.  Please make sure she is aware to instructions on every rx as we eventually need to wean off altogether

## 2020-12-03 ENCOUNTER — Other Ambulatory Visit: Payer: Self-pay

## 2020-12-03 ENCOUNTER — Ambulatory Visit (INDEPENDENT_AMBULATORY_CARE_PROVIDER_SITE_OTHER): Payer: Medicare Other | Admitting: Physical Therapy

## 2020-12-03 ENCOUNTER — Encounter: Payer: Self-pay | Admitting: Physical Therapy

## 2020-12-03 DIAGNOSIS — R2689 Other abnormalities of gait and mobility: Secondary | ICD-10-CM | POA: Diagnosis not present

## 2020-12-03 DIAGNOSIS — R600 Localized edema: Secondary | ICD-10-CM | POA: Diagnosis not present

## 2020-12-03 DIAGNOSIS — M25562 Pain in left knee: Secondary | ICD-10-CM | POA: Diagnosis not present

## 2020-12-03 DIAGNOSIS — G8929 Other chronic pain: Secondary | ICD-10-CM | POA: Diagnosis not present

## 2020-12-03 DIAGNOSIS — M6281 Muscle weakness (generalized): Secondary | ICD-10-CM

## 2020-12-03 DIAGNOSIS — M25662 Stiffness of left knee, not elsewhere classified: Secondary | ICD-10-CM | POA: Diagnosis not present

## 2020-12-03 NOTE — Therapy (Signed)
[facility] [address] [place], [place], [address] Phone: [phone]   Fax:  [phone]  Physical Therapy Treatment  Patient Details  Name: Leslie Davis MRN: [id number] Date of Birth: 1955/11/09 Referring Provider (PT): Frankey Shown MD   Encounter Date: 12/03/2020   PT End of Session - 12/03/20 1026     Visit Number 7    Number of Visits 16    Date for PT Re-Evaluation 01/07/21    Authorization Type UHC MCR    Progress Note Due on Visit 10    PT Start Time 0937    PT Stop Time 1023    PT Time Calculation (min) 46 min    Activity Tolerance Patient tolerated treatment well    Behavior During Therapy [facility] for tasks assessed/performed             Past Medical History:  Diagnosis Date   DM (diabetes mellitus) ([place])    Fatty liver disease, nonalcoholic    Gastritis    Gastroparesis    GERD (gastroesophageal reflux disease)    Heartburn    Hiatal hernia    HLD (hyperlipidemia)    HTN (hypertension)    Joint pain    Renal calculus    Rheumatoid arthritis ([place])    Vitamin D deficiency     Past Surgical History:  Procedure Laterality Date   CHOLECYSTECTOMY     COLONOSCOPY     TOTAL KNEE ARTHROPLASTY Left 10/27/2020   Procedure: LEFT TOTAL KNEE ARTHROPLASTY;  Surgeon: [name], MD;  Location: [place];  Service: Orthopedics;  Laterality: Left;   TUBAL LIGATION     UPPER GASTROINTESTINAL ENDOSCOPY      There were no vitals filed for this visit.   Subjective Assessment - 12/03/20 0937     Subjective doing well, knee is a little painful today    Pertinent History DM, HTN    Patient Stated Goals Get back to my old self; no walker    Currently in Pain? Yes    Pain Location Knee    Pain Orientation Left    Pain Descriptors / Indicators Aching;Tightness;Sore    Pain Type Surgical pain    Pain Onset 1 to 4 weeks ago    Pain Frequency Constant    Aggravating Factors  prolonged walking    Pain Relieving Factors resting                                [facility] Adult PT Treatment/Exercise - 12/03/20 0942       Knee/Hip Exercises: Stretches   Knee: Self-Stretch Limitations LLE 10 x 10 sec with RLE overpressure    Other Knee/Hip Stretches tailgate flexion x 3 min      Knee/Hip Exercises: Aerobic   Nustep Lvl 6 UE/LE 8 mins; working on flexion focus      Knee/Hip Exercises: Seated   Long Arc Quad Left;3 sets;10 reps;Weights    Long Arc Quad Weight 5 lbs.      Vasopneumatic   Number Minutes Vasopneumatic  10 minutes    Vasopnuematic Location  Knee    Vasopneumatic Pressure Medium    Vasopneumatic Temperature  34      Manual Therapy   Manual therapy comments seated Lt knee flexion c distraction/IR mobilization c movement c contralateral leg movement opposite.  PT Short Term Goals - 11/27/20 0825       PT SHORT TERM GOAL #1   Title Pt will be I and compliant with HEP.    Time 4    Period Weeks    Status Achieved      PT SHORT TERM GOAL #2   Title Pt able to safely ambulate community distances without AD    Baseline -    Time 4    Period Weeks    Status Partially Met               PT Long Term Goals - 11/27/20 0825       PT LONG TERM GOAL #1   Title Pt will improve FOTO to >= 70    Baseline FS = 52 at intake    Time 8    Period Weeks    Status On-going    Target Date 01/07/21      PT LONG TERM GOAL #2   Title Pt will improve Rt knee  AROM to 3-120 deg to normalize gait and transfers.    Time 8    Period Weeks    Status On-going    Target Date 01/07/21      PT LONG TERM GOAL #3   Title Pt will improve Lt hip/knee strength to overall 4+ to improve function.    Baseline -    Time 8    Period Weeks    Status On-going    Target Date 01/07/21      PT LONG TERM GOAL #4   Title Pt will report less than 3-4/10 pain with standing/walking at least 30 minutes and negotiating her 3 steps to enter her house.    Baseline -     Time 8    Period Weeks    Status On-going    Target Date 01/07/21                   Plan - 12/03/20 1027     Clinical Impression Statement Pt tolerated session well today continuing to work on strengthening and maximizing ROM.  Will continue to benefit from PT to maximize function, and needs continued TKE focus.    Personal Factors and Comorbidities Comorbidity 2;Past/Current Experience    Comorbidities DM, HTN    Examination-Activity Limitations Locomotion Level;Transfers    Examination-Participation Restrictions Cleaning;Other   working out at gym   Stability/Clinical Decision Making Stable/Uncomplicated    Rehab Potential Excellent    PT Frequency 2x / week    PT Duration 8 weeks    PT Treatment/Interventions ADLs/Self Care Home Management;Aquatic Therapy;Cryotherapy;Electrical Stimulation;Moist Heat;Balance training;Therapeutic exercise;Therapeutic activities;Functional mobility training;Stair training;Gait training;Patient/family education;Manual techniques;Passive range of motion;Scar mobilization;Taping;Vasopneumatic Device    PT Next Visit Plan Quad strength, TKEimprovements, working on ROM.    PT Home Exercise Plan [id number]    Consulted and Agree with Plan of Care Patient             Patient will benefit from skilled therapeutic intervention in order to improve the following deficits and impairments:  Abnormal gait, Decreased range of motion, Pain, Impaired flexibility, Decreased balance, Decreased strength, Increased edema, Postural dysfunction  Visit Diagnosis: Stiffness of left knee, not elsewhere classified  Chronic pain of left knee  Other abnormalities of gait and mobility  Localized edema  Muscle weakness (generalized)     Problem List Patient Active Problem List   Diagnosis Date Noted   Class 1 obesity due to excess calories with serious  comorbidity and body mass index (BMI) of 31.0 to 31.9 in adult 12/01/2020   Status post total left knee  replacement 10/27/2020   Primary osteoarthritis of left knee    NAFLD (nonalcoholic fatty liver disease) 08/06/2020   Other fatigue 07/23/2020   SOBOE (shortness of breath on exertion) 07/23/2020   Diabetes mellitus ([place]) 07/23/2020   Hypertension associated with type 2 diabetes mellitus ([place]) 07/23/2020   Hyperlipidemia associated with type 2 diabetes mellitus ([place]) 07/23/2020   Absolute anemia 07/23/2020   Vitamin D deficiency 07/23/2020   At risk for heart disease 07/23/2020   AKI (acute kidney injury) ([place]) 09/07/2019   Acute kidney injury ([place]) 09/06/2019   Bradycardia 09/06/2019   Hyperglycemia 09/06/2019   HLD (hyperlipidemia)    Hypertensive nephropathy 05/16/2019   Erosive gastritis 11/16/2017   Fatty liver 11/16/2017   Abdominal pain 12/11/2012   Early satiety 12/11/2012   Hiatal hernia    Hx of cholecystectomy 02/05/2011   Flu-like symptoms 02/05/2011   CONSTIPATION 08/27/2008   FATTY LIVER DISEASE 05/07/2008   Nausea with vomiting 04/23/2008   EPIGASTRIC PAIN 04/23/2008   RENAL CALCULUS 04/22/2008   Type 2 diabetes mellitus with stage 2 chronic kidney disease, with long-term current use of insulin ([place]) 05/19/2006   Type 2 diabetes mellitus with hyperlipidemia ([place]) 05/19/2006   HYPERTENSION, BENIGN SYSTEMIC 05/19/2006   GASTROESOPHAGEAL REFLUX, NO ESOPHAGITIS 05/19/2006   AMENORRHEA 05/19/2006      Laureen Abrahams, PT, DPT 12/03/20 10:31 AM     [facility] [address] [place], [place], [address] Phone: [phone]   Fax:  [phone]  Name: Leslie Davis MRN: [id number] Date of Birth: March 14, 1956

## 2020-12-05 ENCOUNTER — Encounter: Payer: Self-pay | Admitting: Physical Therapy

## 2020-12-05 ENCOUNTER — Ambulatory Visit (INDEPENDENT_AMBULATORY_CARE_PROVIDER_SITE_OTHER): Payer: Medicare Other | Admitting: Physical Therapy

## 2020-12-05 ENCOUNTER — Other Ambulatory Visit: Payer: Self-pay

## 2020-12-05 DIAGNOSIS — R2689 Other abnormalities of gait and mobility: Secondary | ICD-10-CM

## 2020-12-05 DIAGNOSIS — G8929 Other chronic pain: Secondary | ICD-10-CM | POA: Diagnosis not present

## 2020-12-05 DIAGNOSIS — M25562 Pain in left knee: Secondary | ICD-10-CM | POA: Diagnosis not present

## 2020-12-05 DIAGNOSIS — M6281 Muscle weakness (generalized): Secondary | ICD-10-CM

## 2020-12-05 DIAGNOSIS — M25662 Stiffness of left knee, not elsewhere classified: Secondary | ICD-10-CM | POA: Diagnosis not present

## 2020-12-05 DIAGNOSIS — R600 Localized edema: Secondary | ICD-10-CM | POA: Diagnosis not present

## 2020-12-05 NOTE — Therapy (Signed)
[facility] [address] [place], [place], [address] Phone: [phone]   Fax:  [phone]  Physical Therapy Treatment  Patient Details  Name: Leslie Davis MRN: [id number] Date of Birth: [birth year] Referring Provider (PT): Frankey Shown MD   Encounter Date: 12/05/2020   PT End of Session - 12/05/20 1012     Visit Number 8    Number of Visits 16    Date for PT Re-Evaluation 01/07/21    Authorization Type UHC MCR    Progress Note Due on Visit 10    PT Start Time 0940   pt arrived late   PT Stop Time 1020    PT Time Calculation (min) 40 min    Activity Tolerance Patient tolerated treatment well    Behavior During Therapy WFL for tasks assessed/performed             Past Medical History:  Diagnosis Date   DM (diabetes mellitus) ([place])    Fatty liver disease, nonalcoholic    Gastritis    Gastroparesis    GERD (gastroesophageal reflux disease)    Heartburn    Hiatal hernia    HLD (hyperlipidemia)    HTN (hypertension)    Joint pain    Renal calculus    Rheumatoid arthritis ([place])    Vitamin D deficiency     Past Surgical History:  Procedure Laterality Date   CHOLECYSTECTOMY     COLONOSCOPY     TOTAL KNEE ARTHROPLASTY Left 10/27/2020   Procedure: LEFT TOTAL KNEE ARTHROPLASTY;  Surgeon: [name], MD;  Location: [facility];  Service: Orthopedics;  Laterality: Left;   TUBAL LIGATION     UPPER GASTROINTESTINAL ENDOSCOPY      There were no vitals filed for this visit.   Subjective Assessment - 12/05/20 0941     Subjective knee is a little more sore today, trying to increase her activity at home    Pertinent History DM, HTN    Patient Stated Goals Get back to my old self; no walker    Currently in Pain? Yes    Pain Score 6     Pain Location Knee    Pain Orientation Left    Pain Descriptors / Indicators Aching;Sore    Pain Type Surgical pain    Pain Onset 1 to 4 weeks ago    Pain Frequency Constant    Aggravating Factors   prolonged walking    Pain Relieving Factors rest                               [facility] Adult PT Treatment/Exercise - 12/05/20 0942       Knee/Hip Exercises: Aerobic   Nustep Lvl 6 UE/LE 8 mins; working on flexion focus      Knee/Hip Exercises: Machines for Strengthening   Cybex Knee Extension 5# bil push 2x10; unable to get full ext    Cybex Leg Press 25 lbs Lt 3 x 10      Knee/Hip Exercises: Seated   Long Arc Quad Left;3 sets;10 reps;Weights    Long Arc Quad Weight 5 lbs.    Other Seated Knee/Hip Exercises seated SLR with 3s hold 2x10      Vasopneumatic   Number Minutes Vasopneumatic  10 minutes    Vasopnuematic Location  Knee    Vasopneumatic Pressure Medium    Vasopneumatic Temperature  34  PT Short Term Goals - 11/27/20 0825       PT SHORT TERM GOAL #1   Title Pt will be I and compliant with HEP.    Time 4    Period Weeks    Status Achieved      PT SHORT TERM GOAL #2   Title Pt able to safely ambulate community distances without AD    Baseline -    Time 4    Period Weeks    Status Partially Met               PT Long Term Goals - 11/27/20 0825       PT LONG TERM GOAL #1   Title Pt will improve FOTO to >= 70    Baseline FS = 52 at intake    Time 8    Period Weeks    Status On-going    Target Date 01/07/21      PT LONG TERM GOAL #2   Title Pt will improve Rt knee  AROM to 3-120 deg to normalize gait and transfers.    Time 8    Period Weeks    Status On-going    Target Date 01/07/21      PT LONG TERM GOAL #3   Title Pt will improve Lt hip/knee strength to overall 4+ to improve function.    Baseline -    Time 8    Period Weeks    Status On-going    Target Date 01/07/21      PT LONG TERM GOAL #4   Title Pt will report less than 3-4/10 pain with standing/walking at least 30 minutes and negotiating her 3 steps to enter her house.    Baseline -    Time 8    Period Weeks    Status On-going     Target Date 01/07/21                   Plan - 12/05/20 1013     Clinical Impression Statement Session focused on TKE exercises today with quad lag still present with SLR exercises.  Will continue to benefit from PT to maximize function.    Personal Factors and Comorbidities Comorbidity 2;Past/Current Experience    Comorbidities DM, HTN    Examination-Activity Limitations Locomotion Level;Transfers    Examination-Participation Restrictions Cleaning;Other   working out at gym   Stability/Clinical Decision Making Stable/Uncomplicated    Rehab Potential Excellent    PT Frequency 2x / week    PT Duration 8 weeks    PT Treatment/Interventions ADLs/Self Care Home Management;Aquatic Therapy;Cryotherapy;Electrical Stimulation;Moist Heat;Balance training;Therapeutic exercise;Therapeutic activities;Functional mobility training;Stair training;Gait training;Patient/family education;Manual techniques;Passive range of motion;Scar mobilization;Taping;Vasopneumatic Device    PT Next Visit Plan Quad strength, TKEimprovements, working on ROM; measure next week for MD visit    PT Home Exercise Plan [id number]    Consulted and Agree with Plan of Care Patient             Patient will benefit from skilled therapeutic intervention in order to improve the following deficits and impairments:  Abnormal gait, Decreased range of motion, Pain, Impaired flexibility, Decreased balance, Decreased strength, Increased edema, Postural dysfunction  Visit Diagnosis: Stiffness of left knee, not elsewhere classified  Chronic pain of left knee  Other abnormalities of gait and mobility  Localized edema  Muscle weakness (generalized)     Problem List Patient Active Problem List   Diagnosis Date Noted   Class 1 obesity due to excess calories  with serious comorbidity and body mass index (BMI) of 31.0 to 31.9 in adult 12/01/2020   Status post total left knee replacement 10/27/2020   Primary osteoarthritis  of left knee    NAFLD (nonalcoholic fatty liver disease) 08/06/2020   Other fatigue 07/23/2020   SOBOE (shortness of breath on exertion) 07/23/2020   Diabetes mellitus ([place]) 07/23/2020   Hypertension associated with type 2 diabetes mellitus ([place]) 07/23/2020   Hyperlipidemia associated with type 2 diabetes mellitus ([place]) 07/23/2020   Absolute anemia 07/23/2020   Vitamin D deficiency 07/23/2020   At risk for heart disease 07/23/2020   AKI (acute kidney injury) ([place]) 09/07/2019   Acute kidney injury ([place]) 09/06/2019   Bradycardia 09/06/2019   Hyperglycemia 09/06/2019   HLD (hyperlipidemia)    Hypertensive nephropathy 05/16/2019   Erosive gastritis 11/16/2017   Fatty liver 11/16/2017   Abdominal pain 12/11/2012   Early satiety 12/11/2012   Hiatal hernia    Hx of cholecystectomy 02/05/2011   Flu-like symptoms 02/05/2011   CONSTIPATION 08/27/2008   FATTY LIVER DISEASE 05/07/2008   Nausea with vomiting 04/23/2008   EPIGASTRIC PAIN 04/23/2008   RENAL CALCULUS 04/22/2008   Type 2 diabetes mellitus with stage 2 chronic kidney disease, with long-term current use of insulin ([place]) 05/19/2006   Type 2 diabetes mellitus with hyperlipidemia ([place]) 05/19/2006   HYPERTENSION, BENIGN SYSTEMIC 05/19/2006   GASTROESOPHAGEAL REFLUX, NO ESOPHAGITIS 05/19/2006   AMENORRHEA 05/19/2006      Laureen Abrahams, PT, DPT 12/05/20 10:14 AM     [facility] [address] [place], [place], [address] Phone: [phone]   Fax:  [phone]  Name: Leslie Davis MRN: [id number] Date of Birth: [birth year]

## 2020-12-09 ENCOUNTER — Encounter: Payer: Self-pay | Admitting: Orthopaedic Surgery

## 2020-12-09 ENCOUNTER — Ambulatory Visit: Payer: Medicare Other

## 2020-12-09 ENCOUNTER — Ambulatory Visit (INDEPENDENT_AMBULATORY_CARE_PROVIDER_SITE_OTHER): Payer: Medicare Other | Admitting: Rehabilitative and Restorative Service Providers"

## 2020-12-09 ENCOUNTER — Encounter: Payer: Self-pay | Admitting: Rehabilitative and Restorative Service Providers"

## 2020-12-09 ENCOUNTER — Other Ambulatory Visit: Payer: Self-pay

## 2020-12-09 ENCOUNTER — Ambulatory Visit (INDEPENDENT_AMBULATORY_CARE_PROVIDER_SITE_OTHER): Payer: Medicare Other | Admitting: Orthopaedic Surgery

## 2020-12-09 DIAGNOSIS — R2689 Other abnormalities of gait and mobility: Secondary | ICD-10-CM

## 2020-12-09 DIAGNOSIS — G8929 Other chronic pain: Secondary | ICD-10-CM

## 2020-12-09 DIAGNOSIS — M25662 Stiffness of left knee, not elsewhere classified: Secondary | ICD-10-CM

## 2020-12-09 DIAGNOSIS — R600 Localized edema: Secondary | ICD-10-CM

## 2020-12-09 DIAGNOSIS — M25562 Pain in left knee: Secondary | ICD-10-CM | POA: Diagnosis not present

## 2020-12-09 DIAGNOSIS — Z96652 Presence of left artificial knee joint: Secondary | ICD-10-CM | POA: Diagnosis not present

## 2020-12-09 DIAGNOSIS — M6281 Muscle weakness (generalized): Secondary | ICD-10-CM

## 2020-12-09 NOTE — Therapy (Signed)
[facility] [address] [place], [place], [address] Phone: [phone]   Fax:  [phone]  Physical Therapy Treatment  Patient Details  Name: Leslie Davis MRN: [id number] Date of Birth: Mar 15, 1956 Referring Provider (PT): Frankey Shown MD   Encounter Date: 12/09/2020   PT End of Session - 12/09/20 0809     Visit Number 9    Number of Visits 16    Date for PT Re-Evaluation 01/07/21    Authorization Type UHC MCR    Progress Note Due on Visit 10    PT Start Time 0801    PT Stop Time 0851    PT Time Calculation (min) 50 min    Activity Tolerance Patient tolerated treatment well    Behavior During Therapy [facility] for tasks assessed/performed             Past Medical History:  Diagnosis Date   DM (diabetes mellitus) ([place])    Fatty liver disease, nonalcoholic    Gastritis    Gastroparesis    GERD (gastroesophageal reflux disease)    Heartburn    Hiatal hernia    HLD (hyperlipidemia)    HTN (hypertension)    Joint pain    Renal calculus    Rheumatoid arthritis ([place])    Vitamin D deficiency     Past Surgical History:  Procedure Laterality Date   CHOLECYSTECTOMY     COLONOSCOPY     TOTAL KNEE ARTHROPLASTY Left 10/27/2020   Procedure: LEFT TOTAL KNEE ARTHROPLASTY;  Surgeon: [name], MD;  Location: [place];  Service: Orthopedics;  Laterality: Left;   TUBAL LIGATION     UPPER GASTROINTESTINAL ENDOSCOPY      There were no vitals filed for this visit.   Subjective Assessment - 12/09/20 0806     Subjective Pt. indicated 4-5/10 pain or so.  Pt. stated doing more activity than she has been, not using walker anymore.  Pt. stated sitting prolonged created tightness/swelling and standing/walking up to 2 hours is too much.    Pertinent History DM, HTN    Patient Stated Goals Get back to my old self; no walker    Currently in Pain? Yes    Pain Score 4     Pain Location Knee    Pain Orientation Left    Pain Descriptors / Indicators  Aching;Sore;Tightness    Pain Type Surgical pain    Pain Onset More than a month ago    Pain Frequency Intermittent    Aggravating Factors  prolonged activity (sitting, standing, walking)    Pain Relieving Factors changing position                [facility] PT Assessment - 12/09/20 0001       Assessment   Medical Diagnosis s/p Lt TKR    Referring Provider (PT) Frankey Shown MD    Onset Date/Surgical Date 10/27/20    Hand Dominance Right      AROM   Overall AROM Comments TKE lacking in SLR supine 10 degrees    Left Knee Flexion 100   in supine heel slide     PROM   Left Knee Extension 0                           [facility] Adult PT Treatment/Exercise - 12/09/20 0001       Knee/Hip Exercises: Stretches   Gastroc Stretch 60 seconds;2 reps;Both   incline board  Knee/Hip Exercises: Aerobic   Recumbent Bike Seat 6 partial circles 6 mins      Knee/Hip Exercises: Machines for Strengthening   Cybex Knee Extension 5 lbs eccentric lowering on Lt x 10 (anterior knee pain in full flexion loading)      Knee/Hip Exercises: Standing   Forward Step Up Step Height: 4";10 reps;Left   Rt hand assist on bar     Knee/Hip Exercises: Seated   Other Seated Knee/Hip Exercises seated SLR x 15      Knee/Hip Exercises: Supine   Straight Leg Raises Left;15 reps    Other Supine Knee/Hip Exercises supine heel slide x 10      Vasopneumatic   Number Minutes Vasopneumatic  10 minutes    Vasopnuematic Location  Knee   Lt   Vasopneumatic Pressure Medium    Vasopneumatic Temperature  34      Manual Therapy   Manual therapy comments seated Lt knee flexion c distraction/IR mobilization c movement c contralateral leg movement opposite.                     PT Education - 12/09/20 [phone]     Education Details Review of existing HEP and emphasis on SLR    Person(s) Educated Patient    Methods Explanation;Demonstration;Verbal cues    Comprehension Verbalized  understanding;Returned demonstration              PT Short Term Goals - 11/27/20 0825       PT SHORT TERM GOAL #1   Title Pt will be I and compliant with HEP.    Time 4    Period Weeks    Status Achieved      PT SHORT TERM GOAL #2   Title Pt able to safely ambulate community distances without AD    Baseline -    Time 4    Period Weeks    Status Partially Met               PT Long Term Goals - 12/09/20 0815       PT LONG TERM GOAL #1   Title Pt will improve FOTO to >= 70    Time 8    Period Weeks    Status On-going    Target Date 01/07/21      PT LONG TERM GOAL #2   Title Pt will improve Rt knee  AROM to 3-120 deg to normalize gait and transfers.    Time 8    Period Weeks    Status On-going    Target Date 01/07/21      PT LONG TERM GOAL #3   Title Pt will improve Lt hip/knee strength to overall 4+ to improve function.    Time 8    Period Weeks    Status On-going    Target Date 01/07/21      PT LONG TERM GOAL #4   Title Pt will report less than 3-4/10 pain with standing/walking at least 30 minutes and negotiating her 3 steps to enter her house.    Time 8    Period Weeks    Status On-going    Target Date 01/07/21                   Plan - 12/09/20 0816     Clinical Impression Statement Flexion mobility continued to steadily improve at this time (more pain limitation vs. any firm end feel at measured range).  TKE lacking and quad  strength deficits more important focus at this time.  Continued skilled PT services indicated.    Personal Factors and Comorbidities Comorbidity 2;Past/Current Experience    Comorbidities DM, HTN    Examination-Activity Limitations Locomotion Level;Transfers    Examination-Participation Restrictions Cleaning;Other   working out at gym   Stability/Clinical Decision Making Stable/Uncomplicated    Rehab Potential Excellent    PT Frequency 2x / week    PT Duration 8 weeks    PT Treatment/Interventions ADLs/Self Care  Home Management;Aquatic Therapy;Cryotherapy;Electrical Stimulation;Moist Heat;Balance training;Therapeutic exercise;Therapeutic activities;Functional mobility training;Stair training;Gait training;Patient/family education;Manual techniques;Passive range of motion;Scar mobilization;Taping;Vasopneumatic Device    PT Next Visit Plan Formal 10th visit PN/FOTO    PT Home Exercise Plan [id number]    Consulted and Agree with Plan of Care Patient             Patient will benefit from skilled therapeutic intervention in order to improve the following deficits and impairments:  Abnormal gait, Decreased range of motion, Pain, Impaired flexibility, Decreased balance, Decreased strength, Increased edema, Postural dysfunction  Visit Diagnosis: Stiffness of left knee, not elsewhere classified  Chronic pain of left knee  Other abnormalities of gait and mobility  Localized edema  Muscle weakness (generalized)     Problem List Patient Active Problem List   Diagnosis Date Noted   Class 1 obesity due to excess calories with serious comorbidity and body mass index (BMI) of 31.0 to 31.9 in adult 12/01/2020   Status post total left knee replacement 10/27/2020   Primary osteoarthritis of left knee    NAFLD (nonalcoholic fatty liver disease) 08/06/2020   Other fatigue 07/23/2020   SOBOE (shortness of breath on exertion) 07/23/2020   Diabetes mellitus ([place]) 07/23/2020   Hypertension associated with type 2 diabetes mellitus ([place]) 07/23/2020   Hyperlipidemia associated with type 2 diabetes mellitus ([place]) 07/23/2020   Absolute anemia 07/23/2020   Vitamin D deficiency 07/23/2020   At risk for heart disease 07/23/2020   AKI (acute kidney injury) ([place]) 09/07/2019   Acute kidney injury ([place]) 09/06/2019   Bradycardia 09/06/2019   Hyperglycemia 09/06/2019   HLD (hyperlipidemia)    Hypertensive nephropathy 05/16/2019   Erosive gastritis 11/16/2017   Fatty liver 11/16/2017   Abdominal pain 12/11/2012    Early satiety 12/11/2012   Hiatal hernia    Hx of cholecystectomy 02/05/2011   Flu-like symptoms 02/05/2011   CONSTIPATION 08/27/2008   FATTY LIVER DISEASE 05/07/2008   Nausea with vomiting 04/23/2008   EPIGASTRIC PAIN 04/23/2008   RENAL CALCULUS 04/22/2008   Type 2 diabetes mellitus with stage 2 chronic kidney disease, with long-term current use of insulin ([place]) 05/19/2006   Type 2 diabetes mellitus with hyperlipidemia ([place]) 05/19/2006   HYPERTENSION, BENIGN SYSTEMIC 05/19/2006   GASTROESOPHAGEAL REFLUX, NO ESOPHAGITIS 05/19/2006   AMENORRHEA 05/19/2006   Scot Jun, PT, DPT, OCS, ATC 12/09/20  8:43 AM    [facility] [address] [place], [place], [address] Phone: [phone]   Fax:  [phone]  Name: CICI RODRIGES MRN: [id number] Date of Birth: 1956-01-14

## 2020-12-09 NOTE — Progress Notes (Signed)
Post-Op Visit Note   Patient: Leslie Davis           Date of Birth: 1955/11/11           MRN: [id number] Visit Date: 12/09/2020 PCP: [name], MD   Assessment & Plan:  Chief Complaint:  Chief Complaint  Patient presents with   Left Knee - Follow-up    Left total knee arthroplasty 10/27/2020   Visit Diagnoses:  1. Status post total left knee replacement     Plan: Leslie Davis is 6 weeks status post left total knee replacement.  She is doing physical therapy twice a week at our office.  She just had her visit today.  She is ambulating with a cane.  Takes occasional hydrocodone.  Only complaint is that her knee swells when she has been on her feet for too long.  Left knee surgical scars fully healed.  Range of motion 0-100.  Stable to varus valgus.  X-rays demonstrate stable implant without any complications.  She will continue with physical therapy at our office.  She can increase activity as tolerated.  Dental prophylaxis reinforced.  Recheck in 6 weeks.  Follow-Up Instructions: Return in about 6 weeks (around 01/20/2021).   Orders:  Orders Placed This Encounter  Procedures   XR Knee 1-2 Views Left   No orders of the defined types were placed in this encounter.   Imaging: XR Knee 1-2 Views Left  Result Date: 12/09/2020 Stable total knee replacement in good alignment.    PMFS History: Patient Active Problem List   Diagnosis Date Noted   Class 1 obesity due to excess calories with serious comorbidity and body mass index (BMI) of 31.0 to 31.9 in adult 12/01/2020   Status post total left knee replacement 10/27/2020   Primary osteoarthritis of left knee    NAFLD (nonalcoholic fatty liver disease) 08/06/2020   Other fatigue 07/23/2020   SOBOE (shortness of breath on exertion) 07/23/2020   Diabetes mellitus ([place]) 07/23/2020   Hypertension associated with type 2 diabetes mellitus ([place]) 07/23/2020   Hyperlipidemia associated with type 2 diabetes mellitus ([place])  07/23/2020   Absolute anemia 07/23/2020   Vitamin D deficiency 07/23/2020   At risk for heart disease 07/23/2020   AKI (acute kidney injury) ([place]) 09/07/2019   Acute kidney injury ([place]) 09/06/2019   Bradycardia 09/06/2019   Hyperglycemia 09/06/2019   HLD (hyperlipidemia)    Hypertensive nephropathy 05/16/2019   Erosive gastritis 11/16/2017   Fatty liver 11/16/2017   Abdominal pain 12/11/2012   Early satiety 12/11/2012   Hiatal hernia    Hx of cholecystectomy 02/05/2011   Flu-like symptoms 02/05/2011   CONSTIPATION 08/27/2008   FATTY LIVER DISEASE 05/07/2008   Nausea with vomiting 04/23/2008   EPIGASTRIC PAIN 04/23/2008   RENAL CALCULUS 04/22/2008   Type 2 diabetes mellitus with stage 2 chronic kidney disease, with long-term current use of insulin ([place]) 05/19/2006   Type 2 diabetes mellitus with hyperlipidemia ([place]) 05/19/2006   HYPERTENSION, BENIGN SYSTEMIC 05/19/2006   GASTROESOPHAGEAL REFLUX, NO ESOPHAGITIS 05/19/2006   AMENORRHEA 05/19/2006   Past Medical History:  Diagnosis Date   DM (diabetes mellitus) ([place])    Fatty liver disease, nonalcoholic    Gastritis    Gastroparesis    GERD (gastroesophageal reflux disease)    Heartburn    Hiatal hernia    HLD (hyperlipidemia)    HTN (hypertension)    Joint pain    Renal calculus    Rheumatoid arthritis ([place])    Vitamin D  deficiency     Family History  Problem Relation Age of Onset   Diabetes Mother    Thyroid disease Mother    Kidney disease Mother    Diabetes Maternal Grandmother    Stomach cancer Maternal Aunt        X 2 aunts   Hypertension Father    Hyperlipidemia Father    Sudden death Father    Stroke Father    Colon cancer Neg Hx    Esophageal cancer Neg Hx    Rectal cancer Neg Hx     Past Surgical History:  Procedure Laterality Date   CHOLECYSTECTOMY     COLONOSCOPY     TOTAL KNEE ARTHROPLASTY Left 10/27/2020   Procedure: LEFT TOTAL KNEE ARTHROPLASTY;  Surgeon: [name], MD;  Location: [place];   Service: Orthopedics;  Laterality: Left;   TUBAL LIGATION     UPPER GASTROINTESTINAL ENDOSCOPY     Social History   Occupational History   Occupation: Housekeeping    Comment: Estate manager/land agent  Tobacco Use   Smoking status: Former    Packs/day: 0.25    Years: 1.00    Pack years: 0.25    Types: Cigarettes    Quit date: 09/10/1974    Years since quitting: 46.2   Smokeless tobacco: Never   Tobacco comments:    she no longer smokes.   Vaping Use   Vaping Use: Never used  Substance and Sexual Activity   Alcohol use: No   Drug use: No   Sexual activity: Not Currently

## 2020-12-10 ENCOUNTER — Other Ambulatory Visit: Payer: Self-pay | Admitting: Physician Assistant

## 2020-12-10 ENCOUNTER — Telehealth: Payer: Self-pay | Admitting: *Deleted

## 2020-12-10 MED ORDER — HYDROCODONE-ACETAMINOPHEN 5-325 MG PO TABS: 1.0000 | ORAL_TABLET | Freq: Two times a day (BID) | ORAL | 0 refills | Status: DC | PRN

## 2020-12-10 MED ORDER — IBUPROFEN 800 MG PO TABS: 800.0000 mg | ORAL_TABLET | Freq: Three times a day (TID) | ORAL | 2 refills | Status: DC | PRN

## 2020-12-10 NOTE — Telephone Encounter (Signed)
Sent in both.  Weaning down the norco

## 2020-12-10 NOTE — Telephone Encounter (Signed)
Patient called asking if she could get a refill of Norco. Also states she and Dr. Erlinda Hong discussed her taking Ibuprofen 800 mg. Wanted to know if she could get Rx for this, so she doesn't have to take so many pills at one time. Thanks.

## 2020-12-11 ENCOUNTER — Ambulatory Visit (INDEPENDENT_AMBULATORY_CARE_PROVIDER_SITE_OTHER): Payer: Medicare Other | Admitting: Physical Therapy

## 2020-12-11 ENCOUNTER — Encounter: Payer: Self-pay | Admitting: Physical Therapy

## 2020-12-11 ENCOUNTER — Other Ambulatory Visit: Payer: Self-pay

## 2020-12-11 DIAGNOSIS — M25662 Stiffness of left knee, not elsewhere classified: Secondary | ICD-10-CM

## 2020-12-11 DIAGNOSIS — R600 Localized edema: Secondary | ICD-10-CM | POA: Diagnosis not present

## 2020-12-11 DIAGNOSIS — R2689 Other abnormalities of gait and mobility: Secondary | ICD-10-CM

## 2020-12-11 DIAGNOSIS — M6281 Muscle weakness (generalized): Secondary | ICD-10-CM

## 2020-12-11 DIAGNOSIS — G8929 Other chronic pain: Secondary | ICD-10-CM | POA: Diagnosis not present

## 2020-12-11 DIAGNOSIS — M25562 Pain in left knee: Secondary | ICD-10-CM

## 2020-12-11 NOTE — Telephone Encounter (Signed)
Ok, thanks.

## 2020-12-11 NOTE — Therapy (Signed)
[facility] [address] [place], [place], [address] Phone: [phone]   Fax:  [phone]  Physical Therapy Treatment/Progress Note Progress Note Reporting Period 11/12/20 to 12/11/20   See note below for Objective Data and Assessment of Progress/Goals.      Patient Details  Name: Leslie Davis MRN: [id number] Date of Birth: 04/17/1955 Referring Provider (PT): Frankey Shown MD   Encounter Date: 12/11/2020   PT End of Session - 12/11/20 1001     Visit Number 10    Number of Visits 16    Date for PT Re-Evaluation 01/07/21    Authorization Type UHC MCR    Progress Note Due on Visit 10    PT Start Time 0940   pt arrived late   PT Stop Time 1020    PT Time Calculation (min) 40 min    Activity Tolerance Patient tolerated treatment well    Behavior During Therapy WFL for tasks assessed/performed             Past Medical History:  Diagnosis Date   DM (diabetes mellitus) ([place])    Fatty liver disease, nonalcoholic    Gastritis    Gastroparesis    GERD (gastroesophageal reflux disease)    Heartburn    Hiatal hernia    HLD (hyperlipidemia)    HTN (hypertension)    Joint pain    Renal calculus    Rheumatoid arthritis ([place])    Vitamin D deficiency     Past Surgical History:  Procedure Laterality Date   CHOLECYSTECTOMY     COLONOSCOPY     TOTAL KNEE ARTHROPLASTY Left 10/27/2020   Procedure: LEFT TOTAL KNEE ARTHROPLASTY;  Surgeon: [name], MD;  Location: [place];  Service: Orthopedics;  Laterality: Left;   TUBAL LIGATION     UPPER GASTROINTESTINAL ENDOSCOPY      There were no vitals filed for this visit.   Subjective Assessment - 12/11/20 1000     Subjective no pain upon arrival to PT today, increases up to 7/10 with working on flexion    Pertinent History DM, HTN    Patient Stated Goals Get back to my old self; no walker    Currently in Pain? No/denies                [facility] PT Assessment - 12/11/20 1004        Assessment   Medical Diagnosis s/p Lt TKR    Referring Provider (PT) Frankey Shown MD    Onset Date/Surgical Date 10/27/20      Observation/Other Assessments   Focus on Therapeutic Outcomes (FOTO)  61      AROM   Left Knee Flexion 100      PROM   Left Knee Extension 0      Strength   Overall Strength Comments Lt knee ext and flexion 3-/5                           [facility] Adult PT Treatment/Exercise - 12/11/20 0940       Knee/Hip Exercises: Stretches   Knee: Self-Stretch Limitations seated with forward scoot 3x30 sec      Knee/Hip Exercises: Aerobic   Recumbent Bike Seat 6 partial circles 8 mins      Knee/Hip Exercises: Machines for Strengthening   Cybex Leg Press 31 lbs Lt 3 x 10      Knee/Hip Exercises: Seated   Other Seated Knee/Hip Exercises seated SLR 3x10 -  mod cues to decrease extensor lag      Vasopneumatic   Number Minutes Vasopneumatic  10 minutes    Vasopnuematic Location  Knee    Vasopneumatic Pressure Medium    Vasopneumatic Temperature  34                       PT Short Term Goals - 11/27/20 0825       PT SHORT TERM GOAL #1   Title Pt will be I and compliant with HEP.    Time 4    Period Weeks    Status Achieved      PT SHORT TERM GOAL #2   Title Pt able to safely ambulate community distances without AD    Baseline -    Time 4    Period Weeks    Status Partially Met               PT Long Term Goals - 12/11/20 1007       PT LONG TERM GOAL #1   Title Pt will improve FOTO to >= 70    Time 8    Period Weeks    Status On-going    Target Date 01/07/21      PT LONG TERM GOAL #2   Title Pt will improve Rt knee  AROM to 3-120 deg to normalize gait and transfers.    Time 8    Period Weeks    Status On-going    Target Date 01/07/21      PT LONG TERM GOAL #3   Title Pt will improve Lt hip/knee strength to overall 4+ to improve function.    Time 8    Period Weeks    Status On-going    Target Date  01/07/21      PT LONG TERM GOAL #4   Title Pt will report less than 3-4/10 pain with standing/walking at least 30 minutes and negotiating her 3 steps to enter her house.    Time 8    Period Weeks    Status On-going    Target Date 01/07/21                   Plan - 12/11/20 1124     Clinical Impression Statement Pt demonstrating progress towards all LTGs just not to goal at this time.  Continues to need focus on quad strength and maximizing ROM at this time.  Will contiue to benefit from PT to maximize function.    Personal Factors and Comorbidities Comorbidity 2;Past/Current Experience    Comorbidities DM, HTN    Examination-Activity Limitations Locomotion Level;Transfers    Examination-Participation Restrictions Cleaning;Other   working out at gym   Stability/Clinical Decision Making Stable/Uncomplicated    Rehab Potential Excellent    PT Frequency 2x / week    PT Duration 8 weeks    PT Treatment/Interventions ADLs/Self Care Home Management;Aquatic Therapy;Cryotherapy;Electrical Stimulation;Moist Heat;Balance training;Therapeutic exercise;Therapeutic activities;Functional mobility training;Stair training;Gait training;Patient/family education;Manual techniques;Passive range of motion;Scar mobilization;Taping;Vasopneumatic Device    PT Next Visit Plan continue working on quad strength/TKE, flexion ROM    PT Home Exercise Plan [id number]    Consulted and Agree with Plan of Care Patient             Patient will benefit from skilled therapeutic intervention in order to improve the following deficits and impairments:  Abnormal gait, Decreased range of motion, Pain, Impaired flexibility, Decreased balance, Decreased strength, Increased edema, Postural dysfunction  Visit Diagnosis:  Stiffness of left knee, not elsewhere classified  Chronic pain of left knee  Other abnormalities of gait and mobility  Localized edema  Muscle weakness (generalized)     Problem  List Patient Active Problem List   Diagnosis Date Noted   Class 1 obesity due to excess calories with serious comorbidity and body mass index (BMI) of 31.0 to 31.9 in adult 12/01/2020   Status post total left knee replacement 10/27/2020   Primary osteoarthritis of left knee    NAFLD (nonalcoholic fatty liver disease) 08/06/2020   Other fatigue 07/23/2020   SOBOE (shortness of breath on exertion) 07/23/2020   Diabetes mellitus ([place]) 07/23/2020   Hypertension associated with type 2 diabetes mellitus ([place]) 07/23/2020   Hyperlipidemia associated with type 2 diabetes mellitus ([place]) 07/23/2020   Absolute anemia 07/23/2020   Vitamin D deficiency 07/23/2020   At risk for heart disease 07/23/2020   AKI (acute kidney injury) ([place]) 09/07/2019   Acute kidney injury ([place]) 09/06/2019   Bradycardia 09/06/2019   Hyperglycemia 09/06/2019   HLD (hyperlipidemia)    Hypertensive nephropathy 05/16/2019   Erosive gastritis 11/16/2017   Fatty liver 11/16/2017   Abdominal pain 12/11/2012   Early satiety 12/11/2012   Hiatal hernia    Hx of cholecystectomy 02/05/2011   Flu-like symptoms 02/05/2011   CONSTIPATION 08/27/2008   FATTY LIVER DISEASE 05/07/2008   Nausea with vomiting 04/23/2008   EPIGASTRIC PAIN 04/23/2008   RENAL CALCULUS 04/22/2008   Type 2 diabetes mellitus with stage 2 chronic kidney disease, with long-term current use of insulin ([place]) 05/19/2006   Type 2 diabetes mellitus with hyperlipidemia ([place]) 05/19/2006   HYPERTENSION, BENIGN SYSTEMIC 05/19/2006   GASTROESOPHAGEAL REFLUX, NO ESOPHAGITIS 05/19/2006   AMENORRHEA 05/19/2006      Laureen Abrahams, PT, DPT 12/11/20 11:26 AM     [facility] [address] [place], [place], [address] Phone: [phone]   Fax:  [phone]  Name: DORETHY TOMEY MRN: [id number] Date of Birth: 1955/11/12

## 2020-12-15 ENCOUNTER — Encounter: Payer: Medicare Other | Admitting: Physical Therapy

## 2020-12-16 ENCOUNTER — Encounter: Payer: Medicare Other | Admitting: Physical Therapy

## 2020-12-17 ENCOUNTER — Ambulatory Visit (INDEPENDENT_AMBULATORY_CARE_PROVIDER_SITE_OTHER): Payer: Medicare Other

## 2020-12-17 ENCOUNTER — Telehealth: Payer: Self-pay

## 2020-12-17 VITALS — Ht 64.0 in | Wt 184.0 lb

## 2020-12-17 DIAGNOSIS — Z0000 Encounter for general adult medical examination without abnormal findings: Secondary | ICD-10-CM

## 2020-12-17 NOTE — Telephone Encounter (Signed)
I called to notify patient that Dr.Sanders has sent in a Narcan kit to the pharmacy for her due to her taking an opioid and sleep med. Use of both can increase her risk for an overdose. Patient was ok with this. YL,RMA

## 2020-12-17 NOTE — Progress Notes (Signed)
I connected with Sailor Hevia today by telephone and verified that I am speaking with the correct person using two identifiers. Location patient: home Location provider: work Persons participating in the virtual visit: Lavell, Ridings LPN.   I discussed the limitations, risks, security and privacy concerns of performing an evaluation and management service by telephone and the availability of in person appointments. I also discussed with the patient that there may be a patient responsible charge related to this service. The patient expressed understanding and verbally consented to this telephonic visit.    Interactive audio and video telecommunications were attempted between this provider and patient, however failed, due to patient having technical difficulties OR patient did not have access to video capability.  We continued and completed visit with audio only.     Vital signs may be patient reported or missing.  Subjective:   Leslie Davis is a 65 y.o. female who presents for Medicare Annual (Subsequent) preventive examination.  Review of Systems     Cardiac Risk Factors include: advanced age (>49men, >71 women);diabetes mellitus;dyslipidemia;hypertension;obesity (BMI >30kg/m2);sedentary lifestyle     Objective:    Today's Vitals   12/17/20 0901 12/17/20 0902  Weight: 184 lb (83.5 kg)   Height: $Remove'5\' 4"'FnUMyYL$  (1.626 m)   PainSc:  7    Body mass index is 31.58 kg/m.  Advanced Directives 12/17/2020 11/12/2020 10/22/2020 11/15/2019 10/06/2019 09/07/2019 09/06/2019  Does Patient Have a Medical Advance Directive? No No No No No No No  Would patient like information on creating a medical advance directive? - No - Patient declined No - Patient declined - - No - Patient declined -    Current Medications (verified) Outpatient Encounter Medications as of 12/17/2020  Medication Sig   amLODipine (NORVASC) 10 MG tablet TAKE ONE TABLET BY MOUTH ONCE DAILY   aspirin EC 81 MG  tablet Take 1 tablet (81 mg total) by mouth 2 (two) times daily. To be taken after surgery   Azilsartan Medoxomil (EDARBI) 80 MG TABS TAKE 1 TABLET EACH DAY.   Blood Glucose Monitoring Suppl (ONETOUCH VERIO) w/Device KIT Use as directed to check blood sugars 2 times per day dx: e11.65   cetirizine (ZYRTEC) 10 MG tablet Take 1 tablet (10 mg total) by mouth daily.   Cholecalciferol (VITAMIN D3) 1.25 MG (50000 UT) CAPS Take 1 capsule by mouth every 2 wks   dapagliflozin propanediol (FARXIGA) 10 MG TABS tablet Take 1 tablet (10 mg total) by mouth daily before breakfast.   ferrous gluconate (FERGON) 324 MG tablet TAKE 1 TABLET BY MOUTH TWICE DAILY WITH A MEAL. (Patient taking differently: Take 324 mg by mouth 2 (two) times daily with a meal.)   glucose blood (ONETOUCH VERIO) test strip Use as instructed to check blood sugars 2 times per day dx:e11.65   HYDROcodone-acetaminophen (NORCO) 5-325 MG tablet Take 1-2 tablets by mouth 2 (two) times daily as needed.   HYDROcodone-acetaminophen (NORCO) 5-325 MG tablet Take 1 tablet by mouth 2 (two) times daily between meals as needed.   ibuprofen (ADVIL) 800 MG tablet Take 1 tablet (800 mg total) by mouth every 8 (eight) hours as needed.   insulin glargine, 1 Unit Dial, (TOUJEO SOLOSTAR) 300 UNIT/ML Solostar Pen Inject 35 units subcutaneous at bedtime   insulin lispro (HUMALOG KWIKPEN) 100 UNIT/ML KwikPen Sliding scale (Patient taking differently: Inject 4-8 Units into the skin daily as needed (CBG over 150). Sliding scale)   linaclotide ([name]) 145 MCG CAPS capsule Take 1 capsule (145 mcg total)  by mouth daily.   OneTouch Delica Lancets 57Q MISC Use as directed to check blood sugars 2 times per day dx:e11.65   rosuvastatin (CRESTOR) 20 MG tablet Take 1 tablet (20 mg total) by mouth daily.   Semaglutide, 1 MG/DOSE, (OZEMPIC, 1 MG/DOSE,) 4 MG/3ML SOPN Inject 1 mg into the skin once a week. (Patient taking differently: Inject 1 mg into the skin once a week.  Tuesday)   temazepam (RESTORIL) 30 MG capsule One capsules po qhs prn (Patient taking differently: Take 30 mg by mouth at bedtime as needed for sleep.)   TRUEPLUS PEN NEEDLES 31G X 6 MM MISC USE WITH pen TO INJECT insulin DAILY   docusate sodium (COLACE) 100 MG capsule Take 1 capsule (100 mg total) by mouth daily as needed. (Patient not taking: Reported on 12/17/2020)   oxyCODONE-acetaminophen (PERCOCET) 5-325 MG tablet Take 1 tablet by mouth every 12 (twelve) hours as needed. To be taken after surgery (Patient not taking: Reported on 12/17/2020)   pantoprazole (PROTONIX) 40 MG tablet Take one tablet by mouth 3 days a week (Patient not taking: Reported on 12/17/2020)   No facility-administered encounter medications on file as of 12/17/2020.    Allergies (verified) Fiasp [insulin aspart (w-niacinamide)]   History: Past Medical History:  Diagnosis Date   DM (diabetes mellitus) ([place])    Fatty liver disease, nonalcoholic    Gastritis    Gastroparesis    GERD (gastroesophageal reflux disease)    Heartburn    Hiatal hernia    HLD (hyperlipidemia)    HTN (hypertension)    Joint pain    Renal calculus    Rheumatoid arthritis ([place])    Vitamin D deficiency    Past Surgical History:  Procedure Laterality Date   CHOLECYSTECTOMY     COLONOSCOPY     TOTAL KNEE ARTHROPLASTY Left 10/27/2020   Procedure: LEFT TOTAL KNEE ARTHROPLASTY;  Surgeon: [name], MD;  Location: [facility];  Service: Orthopedics;  Laterality: Left;   TUBAL LIGATION     UPPER GASTROINTESTINAL ENDOSCOPY     Family History  Problem Relation Age of Onset   Diabetes Mother    Thyroid disease Mother    Kidney disease Mother    Diabetes Maternal Grandmother    Stomach cancer Maternal Aunt        X 2 aunts   Hypertension Father    Hyperlipidemia Father    Sudden death Father    Stroke Father    Colon cancer Neg Hx    Esophageal cancer Neg Hx    Rectal cancer Neg Hx    Social History   Socioeconomic History   Marital  status: Married    Spouse name: Mia Milan   Number of children: 2   Years of education: Not on file   Highest education level: Not on file  Occupational History   Occupation: Housekeeping    Comment: Estate agent Service  Tobacco Use   Smoking status: Former    Packs/day: 0.25    Years: 1.00    Pack years: 0.25    Types: Cigarettes    Quit date: 09/10/1974    Years since quitting: 46.3   Smokeless tobacco: Never   Tobacco comments:    she no longer smokes.   Vaping Use   Vaping Use: Never used  Substance and Sexual Activity   Alcohol use: No   Drug use: No   Sexual activity: Not Currently  Other Topics Concern   Not on file  Social  History Narrative   Lives with husband.  Part time job.    Social Determinants of Health   Financial Resource Strain: Low Risk    Difficulty of Paying Living Expenses: Not hard at all  Food Insecurity: No Food Insecurity   Worried About Charity fundraiser in the Last Year: Never true   [place] in the Last Year: Never true  Transportation Needs: No Transportation Needs   Lack of Transportation (Medical): No   Lack of Transportation (Non-Medical): No  Physical Activity: Inactive   Days of Exercise per Week: 0 days   Minutes of Exercise per Session: 0 min  Stress: No Stress Concern Present   Feeling of Stress : Not at all  Social Connections: Not on file    Tobacco Counseling Counseling given: Not Answered Tobacco comments: she no longer smokes.    Clinical Intake:  Pre-visit preparation completed: Yes  Pain : 0-10 Pain Score: 7  Pain Type: Acute pain Pain Location: Abdomen Pain Descriptors / Indicators: Sharp Pain Onset: Yesterday Pain Frequency: Intermittent     Nutritional Status: BMI > 30  Obese Nutritional Risks: Nausea/ vomitting/ diarrhea (diarrhea made appointment with GI) Diabetes: Yes  How often do you need to have someone help you when you read instructions, pamphlets, or other written materials  from your doctor or pharmacy?: 1 - Never What is the last grade level you completed in school?: 12th grade  Diabetic? Yes Nutrition Risk Assessment:  Has the patient had any N/V/D within the last 2 months?  Yes  Does the patient have any non-healing wounds?  No  Has the patient had any unintentional weight loss or weight gain?  No   Diabetes:  Is the patient diabetic?  Yes  If diabetic, was a CBG obtained today?  No  Did the patient bring in their glucometer from home?  No  How often do you monitor your CBG's? daily.   Financial Strains and Diabetes Management:  Are you having any financial strains with the device, your supplies or your medication? No .  Does the patient want to be seen by Chronic Care Management for management of their diabetes?  No  Would the patient like to be referred to a Nutritionist or for Diabetic Management?  No   Diabetic Exams:  Diabetic Eye Exam: Overdue for diabetic eye exam. Pt has been advised about the importance in completing this exam. Patient advised to call and schedule an eye exam. Diabetic Foot Exam: Overdue, Pt has been advised about the importance in completing this exam. Pt is scheduled for diabetic foot exam on next appointment.   Interpreter Needed?: No  Information entered by :: NAllen LPN   Activities of Daily Living In your present state of health, do you have any difficulty performing the following activities: 12/17/2020 10/22/2020  Hearing? N N  Vision? N N  Difficulty concentrating or making decisions? N N  Walking or climbing stairs? Y Y  Comment since knee surgery difficulty due to left knee  Dressing or bathing? N N  Doing errands, shopping? N N  Preparing Food and eating ? N -  Using the Toilet? N -  In the past six months, have you accidently leaked urine? N -  Do you have problems with loss of bowel control? N -  Managing your Medications? N -  Managing your Finances? N -  Housekeeping or managing your Housekeeping?  N -  Some recent data might be hidden  Patient Care Team: [name], MD as PCP - General (Internal Medicine) Minus Breeding, MD as PCP - Cardiology (Cardiology) Rex Kras Claudette Stapler, RN as Case Manager Mayford Knife, [facility] (Pharmacist)  Indicate any recent Medical Services you may have received from other than [facility] providers in the past year (date may be approximate).     Assessment:   This is a routine wellness examination for Rheana.  Hearing/Vision screen Vision Screening - Comments:: Regular eye exams, [facility]  Dietary issues and exercise activities discussed: Current Exercise Habits: The patient does not participate in regular exercise at present   Goals Addressed             This Visit's Progress    Patient Stated       12/17/2020, wants to get off insulin and get better       Depression Screen PHQ 2/9 Scores 12/17/2020 07/23/2020 11/15/2019 02/08/2019 11/08/2018 06/13/2018 03/07/2018  PHQ - 2 Score 0 4 0 0 0 0 0  PHQ- 9 Score - 11 0 - - - -    Fall Risk Fall Risk  12/17/2020 11/20/2020 11/15/2019 02/08/2019 11/08/2018  Falls in the past year? 0 0 0 0 0  Number falls in past yr: - 0 - - 0  Injury with Fall? - 0 - 0 -  Risk for fall due to : Medication side effect - Medication side effect - -  Follow up Falls evaluation completed;Education provided;Falls prevention discussed - Falls evaluation completed;Education provided;Falls prevention discussed - -    FALL RISK PREVENTION PERTAINING TO THE HOME:  Any stairs in or around the home? Yes  If so, are there any without handrails? No  Home free of loose throw rugs in walkways, pet beds, electrical cords, etc? Yes  Adequate lighting in your home to reduce risk of falls? Yes   ASSISTIVE DEVICES UTILIZED TO PREVENT FALLS:  Life alert? No  Use of a cane, walker or w/c? Yes  Grab bars in the bathroom? No  Shower chair or bench in shower? No  Elevated toilet seat or a handicapped toilet? No   TIMED UP AND  GO:  Was the test performed? No .      Cognitive Function:     6CIT Screen 12/17/2020 11/15/2019 02/08/2019 11/08/2018  What Year? 0 points 0 points 0 points 0 points  What month? 0 points 0 points 0 points 0 points  What time? 0 points 0 points 0 points 0 points  Count back from 20 0 points 0 points 0 points 0 points  Months in reverse 0 points 0 points 0 points 0 points  Repeat phrase 2 points 2 points 0 points 2 points  Total Score 2 2 0 2    Immunizations Immunization History  Administered Date(s) Administered   DTaP 03/26/2013   Fluad Quad(high Dose 65+) 11/08/2018, 02/21/2020   Influenza, High Dose Seasonal PF 11/08/2018   Influenza-Unspecified 11/30/2017   Moderna Sars-Covid-2 Vaccination 08/03/2019, 08/31/2019   Td 08/20/2005    TDAP status: Up to date  Flu Vaccine status: Due, Education has been provided regarding the importance of this vaccine. Advised may receive this vaccine at local pharmacy or Health Dept. Aware to provide a copy of the vaccination record if obtained from local pharmacy or Health Dept. Verbalized acceptance and understanding.  Pneumococcal vaccine status: Due, Education has been provided regarding the importance of this vaccine. Advised may receive this vaccine at local pharmacy or Health Dept. Aware to provide a copy of the  vaccination record if obtained from local pharmacy or Health Dept. Verbalized acceptance and understanding.  Covid-19 vaccine status: Completed vaccines  Qualifies for Shingles Vaccine? Yes   Zostavax completed No   Shingrix Completed?: No.    Education has been provided regarding the importance of this vaccine. Patient has been advised to call insurance company to determine out of pocket expense if they have not yet received this vaccine. Advised may also receive vaccine at local pharmacy or Health Dept. Verbalized acceptance and understanding.  Screening Tests Health Maintenance  Topic Date Due   Zoster Vaccines-  Shingrix (1 of 2) Never done   COVID-19 Vaccine (3 - Booster for Moderna series) 01/31/2020   OPHTHALMOLOGY EXAM  08/01/2020   INFLUENZA VACCINE  10/20/2020   FOOT EXAM  11/14/2020   HEMOGLOBIN A1C  04/24/2021   COLONOSCOPY (Pts 45-83yrs Insurance coverage will need to be confirmed)  09/09/2021   MAMMOGRAM  03/28/2022   TETANUS/TDAP  03/27/2023   DEXA SCAN  Completed   Hepatitis C Screening  Completed   HPV VACCINES  Aged Out    Health Maintenance  Health Maintenance Due  Topic Date Due   Zoster Vaccines- Shingrix (1 of 2) Never done   COVID-19 Vaccine (3 - Booster for Moderna series) 01/31/2020   OPHTHALMOLOGY EXAM  08/01/2020   INFLUENZA VACCINE  10/20/2020   FOOT EXAM  11/14/2020    Colorectal cancer screening: Type of screening: Colonoscopy. Completed 09/10/2011. Repeat every 10 years  Mammogram status: Completed 03/28/2020. Repeat every year  Bone Density status: Completed 09/19/2017. Results reflect: Bone density results: OSTEOPENIA. Repeat every 3-5 years.  Lung Cancer Screening: (Low Dose CT Chest recommended if Age 40-80 years, 30 pack-year currently smoking OR have quit w/in 15years.) does not qualify.   Lung Cancer Screening Referral: no  Additional Screening:  Hepatitis C Screening: does qualify; Completed 07/04/2020  Vision Screening: Recommended annual ophthalmology exams for early detection of glaucoma and other disorders of the eye. Is the patient up to date with their annual eye exam?  No  Who is the provider or what is the name of the office in which the patient attends annual eye exams? [facility] If pt is not established with a provider, would they like to be referred to a provider to establish care? No .   Dental Screening: Recommended annual dental exams for proper oral hygiene  Community Resource Referral / Chronic Care Management: CRR required this visit?  No   CCM required this visit?  No      Plan:     I have personally reviewed and noted the  following in the patient's chart:   Medical and social history Use of alcohol, tobacco or illicit drugs  Current medications and supplements including opioid prescriptions.  Functional ability and status Nutritional status Physical activity Advanced directives List of other physicians Hospitalizations, surgeries, and ER visits in previous 12 months Vitals Screenings to include cognitive, depression, and falls Referrals and appointments  In addition, I have reviewed and discussed with patient certain preventive protocols, quality metrics, and best practice recommendations. A written personalized care plan for preventive services as well as general preventive health recommendations were provided to patient.     Kellie Simmering, LPN   [id number]   Nurse Notes:

## 2020-12-17 NOTE — Patient Instructions (Signed)
Leslie Davis , Thank you for taking time to come for your Medicare Wellness Visit. I appreciate your ongoing commitment to your health goals. Please review the following plan we discussed and let me know if I can assist you in the future.   Screening recommendations/referrals: Colonoscopy: completed 09/10/2011 Mammogram: completed 03/28/2020 Bone Density: completed 09/19/2017 Recommended yearly ophthalmology/optometry visit for glaucoma screening and checkup Recommended yearly dental visit for hygiene and checkup  Vaccinations: Influenza vaccine: due Pneumococcal vaccine: due Tdap vaccine: completed 03/26/2013, due 03/27/2023 Shingles vaccine: discussed   Covid-19: 08/31/2019, 08/03/2019  Advanced directives: Advance directive discussed with you today.   Conditions/risks identified: none  Next appointment: Follow up in one year for your annual wellness visit    Preventive Care 65 Years and Older, Female Preventive care refers to lifestyle choices and visits with your health care provider that can promote health and wellness. What does preventive care include? A yearly physical exam. This is also called an annual well check. Dental exams once or twice a year. Routine eye exams. Ask your health care provider how often you should have your eyes checked. Personal lifestyle choices, including: Daily care of your teeth and gums. Regular physical activity. Eating a healthy diet. Avoiding tobacco and drug use. Limiting alcohol use. Practicing safe sex. Taking low-dose aspirin every day. Taking vitamin and mineral supplements as recommended by your health care provider. What happens during an annual well check? The services and screenings done by your health care provider during your annual well check will depend on your age, overall health, lifestyle risk factors, and family history of disease. Counseling  Your health care provider may ask you questions about your: Alcohol use. Tobacco  use. Drug use. Emotional well-being. Home and relationship well-being. Sexual activity. Eating habits. History of falls. Memory and ability to understand (cognition). Work and work Statistician. Reproductive health. Screening  You may have the following tests or measurements: Height, weight, and BMI. Blood pressure. Lipid and cholesterol levels. These may be checked every 5 years, or more frequently if you are over 26 years old. Skin check. Lung cancer screening. You may have this screening every year starting at age 101 if you have a 30-pack-year history of smoking and currently smoke or have quit within the past 15 years. Fecal occult blood test (FOBT) of the stool. You may have this test every year starting at age 61. Flexible sigmoidoscopy or colonoscopy. You may have a sigmoidoscopy every 5 years or a colonoscopy every 10 years starting at age 71. Hepatitis C blood test. Hepatitis B blood test. Sexually transmitted disease (STD) testing. Diabetes screening. This is done by checking your blood sugar (glucose) after you have not eaten for a while (fasting). You may have this done every 1-3 years. Bone density scan. This is done to screen for osteoporosis. You may have this done starting at age 62. Mammogram. This may be done every 1-2 years. Talk to your health care provider about how often you should have regular mammograms. Talk with your health care provider about your test results, treatment options, and if necessary, the need for more tests. Vaccines  Your health care provider may recommend certain vaccines, such as: Influenza vaccine. This is recommended every year. Tetanus, diphtheria, and acellular pertussis (Tdap, Td) vaccine. You may need a Td booster every 10 years. Zoster vaccine. You may need this after age 45. Pneumococcal 13-valent conjugate (PCV13) vaccine. One dose is recommended after age 23. Pneumococcal polysaccharide (PPSV23) vaccine. One dose is recommended  after age 80. Talk to your health care provider about which screenings and vaccines you need and how often you need them. This information is not intended to replace advice given to you by your health care provider. Make sure you discuss any questions you have with your health care provider. Document Released: 04/04/2015 Document Revised: 11/26/2015 Document Reviewed: 01/07/2015 Elsevier Interactive Patient Education  2017 [employer] Prevention in the Home Falls can cause injuries. They can happen to people of all ages. There are many things you can do to make your home safe and to help prevent falls. What can I do on the outside of my home? Regularly fix the edges of walkways and driveways and fix any cracks. Remove anything that might make you trip as you walk through a door, such as a raised step or threshold. Trim any bushes or trees on the path to your home. Use bright outdoor lighting. Clear any walking paths of anything that might make someone trip, such as rocks or tools. Regularly check to see if handrails are loose or broken. Make sure that both sides of any steps have handrails. Any raised decks and porches should have guardrails on the edges. Have any leaves, snow, or ice cleared regularly. Use sand or salt on walking paths during winter. Clean up any spills in your garage right away. This includes oil or grease spills. What can I do in the bathroom? Use night lights. Install grab bars by the toilet and in the tub and shower. Do not use towel bars as grab bars. Use non-skid mats or decals in the tub or shower. If you need to sit down in the shower, use a plastic, non-slip stool. Keep the floor dry. Clean up any water that spills on the floor as soon as it happens. Remove soap buildup in the tub or shower regularly. Attach bath mats securely with double-sided non-slip rug tape. Do not have throw rugs and other things on the floor that can make you trip. What can I do  in the bedroom? Use night lights. Make sure that you have a light by your bed that is easy to reach. Do not use any sheets or blankets that are too big for your bed. They should not hang down onto the floor. Have a firm chair that has side arms. You can use this for support while you get dressed. Do not have throw rugs and other things on the floor that can make you trip. What can I do in the kitchen? Clean up any spills right away. Avoid walking on wet floors. Keep items that you use a lot in easy-to-reach places. If you need to reach something above you, use a strong step stool that has a grab bar. Keep electrical cords out of the way. Do not use floor polish or wax that makes floors slippery. If you must use wax, use non-skid floor wax. Do not have throw rugs and other things on the floor that can make you trip. What can I do with my stairs? Do not leave any items on the stairs. Make sure that there are handrails on both sides of the stairs and use them. Fix handrails that are broken or loose. Make sure that handrails are as long as the stairways. Check any carpeting to make sure that it is firmly attached to the stairs. Fix any carpet that is loose or worn. Avoid having throw rugs at the top or bottom of the stairs. If you do  have throw rugs, attach them to the floor with carpet tape. Make sure that you have a light switch at the top of the stairs and the bottom of the stairs. If you do not have them, ask someone to add them for you. What else can I do to help prevent falls? Wear shoes that: Do not have high heels. Have rubber bottoms. Are comfortable and fit you well. Are closed at the toe. Do not wear sandals. If you use a stepladder: Make sure that it is fully opened. Do not climb a closed stepladder. Make sure that both sides of the stepladder are locked into place. Ask someone to hold it for you, if possible. Clearly mark and make sure that you can see: Any grab bars or  handrails. First and last steps. Where the edge of each step is. Use tools that help you move around (mobility aids) if they are needed. These include: Canes. Walkers. Scooters. Crutches. Turn on the lights when you go into a dark area. Replace any light bulbs as soon as they burn out. Set up your furniture so you have a clear path. Avoid moving your furniture around. If any of your floors are uneven, fix them. If there are any pets around you, be aware of where they are. Review your medicines with your doctor. Some medicines can make you feel dizzy. This can increase your chance of falling. Ask your doctor what other things that you can do to help prevent falls. This information is not intended to replace advice given to you by your health care provider. Make sure you discuss any questions you have with your health care provider. Document Released: 01/02/2009 Document Revised: 08/14/2015 Document Reviewed: 04/12/2014 Elsevier Interactive Patient Education  2017 [employer].

## 2020-12-18 ENCOUNTER — Encounter: Payer: Self-pay | Admitting: Physical Therapy

## 2020-12-18 ENCOUNTER — Ambulatory Visit (INDEPENDENT_AMBULATORY_CARE_PROVIDER_SITE_OTHER): Payer: Medicare Other | Admitting: Physical Therapy

## 2020-12-18 ENCOUNTER — Other Ambulatory Visit: Payer: Self-pay

## 2020-12-18 DIAGNOSIS — M6281 Muscle weakness (generalized): Secondary | ICD-10-CM | POA: Diagnosis not present

## 2020-12-18 DIAGNOSIS — M25562 Pain in left knee: Secondary | ICD-10-CM

## 2020-12-18 DIAGNOSIS — R2689 Other abnormalities of gait and mobility: Secondary | ICD-10-CM

## 2020-12-18 DIAGNOSIS — G8929 Other chronic pain: Secondary | ICD-10-CM

## 2020-12-18 DIAGNOSIS — M25662 Stiffness of left knee, not elsewhere classified: Secondary | ICD-10-CM

## 2020-12-18 DIAGNOSIS — R600 Localized edema: Secondary | ICD-10-CM

## 2020-12-18 NOTE — Therapy (Signed)
[facility] [address] [place], [place], [address] Phone: [phone]   Fax:  [phone]  Physical Therapy Treatment  Patient Details  Name: Leslie Davis MRN: [id number] Date of Birth: 03-23-1955 Referring Provider (PT): Frankey Shown MD   Encounter Date: 12/18/2020   PT End of Session - 12/18/20 1044     Visit Number 11    Number of Visits 16    Date for PT Re-Evaluation 01/07/21    Authorization Type UHC MCR    Progress Note Due on Visit 30    PT Start Time 0845    PT Stop Time 0937    PT Time Calculation (min) 52 min    Activity Tolerance Patient tolerated treatment well    Behavior During Therapy [facility] for tasks assessed/performed             Past Medical History:  Diagnosis Date   DM (diabetes mellitus) ([place])    Fatty liver disease, nonalcoholic    Gastritis    Gastroparesis    GERD (gastroesophageal reflux disease)    Heartburn    Hiatal hernia    HLD (hyperlipidemia)    HTN (hypertension)    Joint pain    Renal calculus    Rheumatoid arthritis ([place])    Vitamin D deficiency     Past Surgical History:  Procedure Laterality Date   CHOLECYSTECTOMY     COLONOSCOPY     TOTAL KNEE ARTHROPLASTY Left 10/27/2020   Procedure: LEFT TOTAL KNEE ARTHROPLASTY;  Surgeon: [name], MD;  Location: [facility];  Service: Orthopedics;  Laterality: Left;   TUBAL LIGATION     UPPER GASTROINTESTINAL ENDOSCOPY      There were no vitals filed for this visit.   Subjective Assessment - 12/18/20 0848     Subjective had a flat tire and then was sick earlier this week so unable to make it to PT.  knee is doing about the same    Pertinent History DM, HTN    Patient Stated Goals Get back to my old self; no walker    Currently in Pain? No/denies                               [facility] Adult PT Treatment/Exercise - 12/18/20 0849       Knee/Hip Exercises: Stretches   Knee: Self-Stretch Limitations LLE with RLE  overpressure 2x10, 10 sec hold      Knee/Hip Exercises: Aerobic   Recumbent Bike Seat 6 partial circles 8 mins      Knee/Hip Exercises: Standing   Lateral Step Up Both;10 reps;Step Height: 6";Hand Hold: 2    Forward Step Up Both;10 reps;Hand Hold: 1;Step Height: 6"      Knee/Hip Exercises: Seated   Sit to Sand 10 reps;without UE support      Vasopneumatic   Number Minutes Vasopneumatic  10 minutes    Vasopnuematic Location  Knee    Vasopneumatic Pressure Medium    Vasopneumatic Temperature  34      Manual Therapy   Manual therapy comments seated Lt knee flexion c distraction/IR mobilization c movement c contralateral leg movement opposite. use of percussive device on quad during manual therapy                 Balance Exercises - 12/18/20 0914       Balance Exercises: Standing   SLS Eyes open;Intermittent upper extremity support   taps to  cones with LLE on floor   Tandem Gait Forward;Intermittent upper extremity support;4 reps                  PT Short Term Goals - 11/27/20 0825       PT SHORT TERM GOAL #1   Title Pt will be I and compliant with HEP.    Time 4    Period Weeks    Status Achieved      PT SHORT TERM GOAL #2   Title Pt able to safely ambulate community distances without AD    Baseline -    Time 4    Period Weeks    Status Partially Met               PT Long Term Goals - 12/11/20 1007       PT LONG TERM GOAL #1   Title Pt will improve FOTO to >= 70    Time 8    Period Weeks    Status On-going    Target Date 01/07/21      PT LONG TERM GOAL #2   Title Pt will improve Rt knee  AROM to 3-120 deg to normalize gait and transfers.    Time 8    Period Weeks    Status On-going    Target Date 01/07/21      PT LONG TERM GOAL #3   Title Pt will improve Lt hip/knee strength to overall 4+ to improve function.    Time 8    Period Weeks    Status On-going    Target Date 01/07/21      PT LONG TERM GOAL #4   Title Pt will report  less than 3-4/10 pain with standing/walking at least 30 minutes and negotiating her 3 steps to enter her house.    Time 8    Period Weeks    Status On-going    Target Date 01/07/21                   Plan - 12/18/20 1045     Clinical Impression Statement Pt tolerated session well today working on strengthening, balance and Lt knee flexion ROM.  Will continue to benefit from PT to maximize function.    Personal Factors and Comorbidities Comorbidity 2;Past/Current Experience    Comorbidities DM, HTN    Examination-Activity Limitations Locomotion Level;Transfers    Examination-Participation Restrictions Cleaning;Other   working out at gym   Stability/Clinical Decision Making Stable/Uncomplicated    Rehab Potential Excellent    PT Frequency 2x / week    PT Duration 8 weeks    PT Treatment/Interventions ADLs/Self Care Home Management;Aquatic Therapy;Cryotherapy;Electrical Stimulation;Moist Heat;Balance training;Therapeutic exercise;Therapeutic activities;Functional mobility training;Stair training;Gait training;Patient/family education;Manual techniques;Passive range of motion;Scar mobilization;Taping;Vasopneumatic Device    PT Next Visit Plan continue working on quad strength/TKE, flexion ROM    PT Home Exercise Plan [email or web address]    Consulted and Agree with Plan of Care Patient             Patient will benefit from skilled therapeutic intervention in order to improve the following deficits and impairments:  Abnormal gait, Decreased range of motion, Pain, Impaired flexibility, Decreased balance, Decreased strength, Increased edema, Postural dysfunction  Visit Diagnosis: Stiffness of left knee, not elsewhere classified  Chronic pain of left knee  Other abnormalities of gait and mobility  Localized edema  Muscle weakness (generalized)     Problem List Patient Active Problem List   Diagnosis Date Noted  Class 1 obesity due to excess calories with serious comorbidity and  body mass index (BMI) of 31.0 to 31.9 in adult 12/01/2020   Status post total left knee replacement 10/27/2020   Primary osteoarthritis of left knee    NAFLD (nonalcoholic fatty liver disease) 08/06/2020   Other fatigue 07/23/2020   SOBOE (shortness of breath on exertion) 07/23/2020   Diabetes mellitus ([place]) 07/23/2020   Hypertension associated with type 2 diabetes mellitus ([place]) 07/23/2020   Hyperlipidemia associated with type 2 diabetes mellitus ([place]) 07/23/2020   Absolute anemia 07/23/2020   Vitamin D deficiency 07/23/2020   At risk for heart disease 07/23/2020   AKI (acute kidney injury) ([place]) 09/07/2019   Acute kidney injury ([place]) 09/06/2019   Bradycardia 09/06/2019   Hyperglycemia 09/06/2019   HLD (hyperlipidemia)    Hypertensive nephropathy 05/16/2019   Erosive gastritis 11/16/2017   Fatty liver 11/16/2017   Abdominal pain 12/11/2012   Early satiety 12/11/2012   Hiatal hernia    Hx of cholecystectomy 02/05/2011   Flu-like symptoms 02/05/2011   CONSTIPATION 08/27/2008   FATTY LIVER DISEASE 05/07/2008   Nausea with vomiting 04/23/2008   EPIGASTRIC PAIN 04/23/2008   RENAL CALCULUS 04/22/2008   Type 2 diabetes mellitus with stage 2 chronic kidney disease, with long-term current use of insulin ([place]) 05/19/2006   Type 2 diabetes mellitus with hyperlipidemia ([place]) 05/19/2006   HYPERTENSION, BENIGN SYSTEMIC 05/19/2006   GASTROESOPHAGEAL REFLUX, NO ESOPHAGITIS 05/19/2006   AMENORRHEA 05/19/2006      Laureen Abrahams, PT, DPT 12/18/20 10:47 AM      [facility] [address] [place], [place], [address] Phone: [phone]   Fax:  [phone]  Name: Leslie Davis MRN: [id number] Date of Birth: 11/30/1955

## 2020-12-19 DIAGNOSIS — E1122 Type 2 diabetes mellitus with diabetic chronic kidney disease: Secondary | ICD-10-CM

## 2020-12-19 DIAGNOSIS — N182 Chronic kidney disease, stage 2 (mild): Secondary | ICD-10-CM

## 2020-12-19 DIAGNOSIS — Z794 Long term (current) use of insulin: Secondary | ICD-10-CM | POA: Diagnosis not present

## 2020-12-19 DIAGNOSIS — I129 Hypertensive chronic kidney disease with stage 1 through stage 4 chronic kidney disease, or unspecified chronic kidney disease: Secondary | ICD-10-CM | POA: Diagnosis not present

## 2020-12-19 DIAGNOSIS — M1712 Unilateral primary osteoarthritis, left knee: Secondary | ICD-10-CM | POA: Diagnosis not present

## 2020-12-22 ENCOUNTER — Encounter (INDEPENDENT_AMBULATORY_CARE_PROVIDER_SITE_OTHER): Payer: Self-pay | Admitting: Family Medicine

## 2020-12-22 ENCOUNTER — Encounter: Payer: Self-pay | Admitting: Internal Medicine

## 2020-12-22 ENCOUNTER — Ambulatory Visit (INDEPENDENT_AMBULATORY_CARE_PROVIDER_SITE_OTHER): Payer: Medicare Other | Admitting: Family Medicine

## 2020-12-22 ENCOUNTER — Other Ambulatory Visit: Payer: Self-pay

## 2020-12-22 ENCOUNTER — Encounter: Payer: Self-pay | Admitting: Rehabilitative and Restorative Service Providers"

## 2020-12-22 ENCOUNTER — Ambulatory Visit (INDEPENDENT_AMBULATORY_CARE_PROVIDER_SITE_OTHER): Payer: Medicare Other | Admitting: Rehabilitative and Restorative Service Providers"

## 2020-12-22 VITALS — BP 140/60 | HR 53 | Temp 97.6°F | Ht 64.0 in | Wt 184.0 lb

## 2020-12-22 DIAGNOSIS — M6281 Muscle weakness (generalized): Secondary | ICD-10-CM

## 2020-12-22 DIAGNOSIS — E1169 Type 2 diabetes mellitus with other specified complication: Secondary | ICD-10-CM | POA: Diagnosis not present

## 2020-12-22 DIAGNOSIS — G8929 Other chronic pain: Secondary | ICD-10-CM

## 2020-12-22 DIAGNOSIS — R600 Localized edema: Secondary | ICD-10-CM

## 2020-12-22 DIAGNOSIS — R2689 Other abnormalities of gait and mobility: Secondary | ICD-10-CM

## 2020-12-22 DIAGNOSIS — M25562 Pain in left knee: Secondary | ICD-10-CM | POA: Diagnosis not present

## 2020-12-22 DIAGNOSIS — M25662 Stiffness of left knee, not elsewhere classified: Secondary | ICD-10-CM | POA: Diagnosis not present

## 2020-12-22 DIAGNOSIS — E559 Vitamin D deficiency, unspecified: Secondary | ICD-10-CM

## 2020-12-22 DIAGNOSIS — H5203 Hypermetropia, bilateral: Secondary | ICD-10-CM | POA: Diagnosis not present

## 2020-12-22 DIAGNOSIS — E119 Type 2 diabetes mellitus without complications: Secondary | ICD-10-CM | POA: Diagnosis not present

## 2020-12-22 DIAGNOSIS — Z6835 Body mass index (BMI) 35.0-35.9, adult: Secondary | ICD-10-CM

## 2020-12-22 DIAGNOSIS — Z794 Long term (current) use of insulin: Secondary | ICD-10-CM | POA: Diagnosis not present

## 2020-12-22 DIAGNOSIS — H40052 Ocular hypertension, left eye: Secondary | ICD-10-CM | POA: Diagnosis not present

## 2020-12-22 LAB — HM DIABETES EYE EXAM

## 2020-12-22 MED ORDER — VITAMIN D3 1.25 MG (50000 UT) PO CAPS: ORAL_CAPSULE | ORAL | 0 refills | Status: DC

## 2020-12-22 NOTE — Therapy (Signed)
[facility] [address] [place], [place], [address] Phone: [phone]   Fax:  [phone]  Physical Therapy Treatment  Patient Details  Name: Leslie Davis MRN: [id number] Date of Birth: October 05, 1955 Referring Provider (PT): Frankey Shown MD   Encounter Date: 12/22/2020   PT End of Session - 12/22/20 0809     Visit Number 12    Number of Visits 16    Date for PT Re-Evaluation 01/07/21    Authorization Type UHC MCR    Progress Note Due on Visit 69    PT Start Time 0803    PT Stop Time 0853    PT Time Calculation (min) 50 min    Activity Tolerance Patient tolerated treatment well    Behavior During Therapy [facility] for tasks assessed/performed             Past Medical History:  Diagnosis Date   DM (diabetes mellitus) ([place])    Fatty liver disease, nonalcoholic    Gastritis    Gastroparesis    GERD (gastroesophageal reflux disease)    Heartburn    Hiatal hernia    HLD (hyperlipidemia)    HTN (hypertension)    Joint pain    Renal calculus    Rheumatoid arthritis ([place])    Vitamin D deficiency     Past Surgical History:  Procedure Laterality Date   CHOLECYSTECTOMY     COLONOSCOPY     TOTAL KNEE ARTHROPLASTY Left 10/27/2020   Procedure: LEFT TOTAL KNEE ARTHROPLASTY;  Surgeon: [name], MD;  Location: [place];  Service: Orthopedics;  Laterality: Left;   TUBAL LIGATION     UPPER GASTROINTESTINAL ENDOSCOPY      There were no vitals filed for this visit.   Subjective Assessment - 12/22/20 0805     Subjective Pt. stated she didn't have any pain today upon arrival.  No specific functional improvements reported from home over weekend.    Pertinent History DM, HTN    Patient Stated Goals Get back to my old self; no walker    Currently in Pain? No/denies    Pain Score 0-No pain    Pain Location Knee    Pain Orientation Left                [facility] PT Assessment - 12/22/20 0001       Assessment   Medical Diagnosis s/p Lt  TKR    Referring Provider (PT) Frankey Shown MD    Onset Date/Surgical Date 10/27/20    Hand Dominance Right      Functional Tests   Functional tests Step down      Step Down   Comments poor control on Lt leg due to weakness/pain in loading      Ambulation/Gait   Gait Comments Independent ambulation in clinic c reduced stance on Lt leg, maintained slight knee flexion in stance, reduced toe off progression.  SPC use in and out of clinic doors.                           [place] [facility] PT Treatment/Exercise - 12/22/20 0001       Neuro Re-ed    Neuro Re-ed Details  SLS c cone touch (anterior, anterior/medial, anterior/lateral) x8 each bilateral, tandem stance 1 min x 2 bilateral   occasional hand assist required on bar     Knee/Hip Exercises: Stretches   Knee: Self-Stretch Limitations seated flexion overpressure c Rt leg - 10-15  seconds between LAQ sets      Knee/Hip Exercises: Aerobic   Recumbent Bike seat 6 parial circles 6 mins      Knee/Hip Exercises: Machines for Strengthening   Cybex Leg Press 37 lbs 3 x 10 Lt      Knee/Hip Exercises: Standing   Lateral Step Up Step Height: 4";15 reps;Left;Hand Hold: 2   slow eccentric lowering   Forward Step Up Step Height: 6";Hand Hold: 1;Left;2 sets;10 reps      Knee/Hip Exercises: Seated   Long Arc Quad Left   3 x 15   Long Arc Quad Weight 6 lbs.      Vasopneumatic   Number Minutes Vasopneumatic  10 minutes    Vasopnuematic Location  Knee    Vasopneumatic Pressure Medium    Vasopneumatic Temperature  34                       PT Short Term Goals - 11/27/20 0825       PT SHORT TERM GOAL #1   Title Pt will be I and compliant with HEP.    Time 4    Period Weeks    Status Achieved      PT SHORT TERM GOAL #2   Title Pt able to safely ambulate community distances without AD    Baseline -    Time 4    Period Weeks    Status Partially Met               PT Long Term Goals - 12/11/20 1007        PT LONG TERM GOAL #1   Title Pt will improve FOTO to >= 70    Time 8    Period Weeks    Status On-going    Target Date 01/07/21      PT LONG TERM GOAL #2   Title Pt will improve Rt knee  AROM to 3-120 deg to normalize gait and transfers.    Time 8    Period Weeks    Status On-going    Target Date 01/07/21      PT LONG TERM GOAL #3   Title Pt will improve Lt hip/knee strength to overall 4+ to improve function.    Time 8    Period Weeks    Status On-going    Target Date 01/07/21      PT LONG TERM GOAL #4   Title Pt will report less than 3-4/10 pain with standing/walking at least 30 minutes and negotiating her 3 steps to enter her house.    Time 8    Period Weeks    Status On-going    Target Date 01/07/21                   Plan - 12/22/20 0824     Clinical Impression Statement Loading on Lt leg in standing (stairs, squat) continued to show strength deficits and control deficits that can continue to improve to improve function.  Pt. currently partially using SPC and also performing independent ambulation c deviations as noted. Continued skilled PT services indicated.    Personal Factors and Comorbidities Comorbidity 2;Past/Current Experience    Comorbidities DM, HTN    Examination-Activity Limitations Locomotion Level;Transfers    Examination-Participation Restrictions Cleaning;Other   working out at gym   Stability/Clinical Decision Making Stable/Uncomplicated    Rehab Potential Excellent    PT Frequency 2x / week    PT Duration 8 weeks      PT Treatment/Interventions ADLs/Self Care Home Management;Aquatic Therapy;Cryotherapy;Electrical Stimulation;Moist Heat;Balance training;Therapeutic exercise;Therapeutic activities;Functional mobility training;Stair training;Gait training;Patient/family education;Manual techniques;Passive range of motion;Scar mobilization;Taping;Vasopneumatic Device    PT Next Visit Plan WB strengthening, eccentric control LAQ, stair, dynamic  balance control.    PT Home Exercise Plan [id number]    Consulted and Agree with Plan of Care Patient             Patient will benefit from skilled therapeutic intervention in order to improve the following deficits and impairments:  Abnormal gait, Decreased range of motion, Pain, Impaired flexibility, Decreased balance, Decreased strength, Increased edema, Postural dysfunction  Visit Diagnosis: Stiffness of left knee, not elsewhere classified  Chronic pain of left knee  Other abnormalities of gait and mobility  Localized edema  Muscle weakness (generalized)     Problem List Patient Active Problem List   Diagnosis Date Noted   Class 1 obesity due to excess calories with serious comorbidity and body mass index (BMI) of 31.0 to 31.9 in adult 12/01/2020   Status post total left knee replacement 10/27/2020   Primary osteoarthritis of left knee    NAFLD (nonalcoholic fatty liver disease) 08/06/2020   Other fatigue 07/23/2020   SOBOE (shortness of breath on exertion) 07/23/2020   Diabetes mellitus ([place]) 07/23/2020   Hypertension associated with type 2 diabetes mellitus ([place]) 07/23/2020   Hyperlipidemia associated with type 2 diabetes mellitus ([place]) 07/23/2020   Absolute anemia 07/23/2020   Vitamin D deficiency 07/23/2020   At risk for heart disease 07/23/2020   AKI (acute kidney injury) ([place]) 09/07/2019   Acute kidney injury ([place]) 09/06/2019   Bradycardia 09/06/2019   Hyperglycemia 09/06/2019   HLD (hyperlipidemia)    Hypertensive nephropathy 05/16/2019   Erosive gastritis 11/16/2017   Fatty liver 11/16/2017   Abdominal pain 12/11/2012   Early satiety 12/11/2012   Hiatal hernia    Hx of cholecystectomy 02/05/2011   Flu-like symptoms 02/05/2011   CONSTIPATION 08/27/2008   FATTY LIVER DISEASE 05/07/2008   Nausea with vomiting 04/23/2008   EPIGASTRIC PAIN 04/23/2008   RENAL CALCULUS 04/22/2008   Type 2 diabetes mellitus with stage 2 chronic kidney disease, with  long-term current use of insulin ([place]) 05/19/2006   Type 2 diabetes mellitus with hyperlipidemia ([place]) 05/19/2006   HYPERTENSION, BENIGN SYSTEMIC 05/19/2006   GASTROESOPHAGEAL REFLUX, NO ESOPHAGITIS 05/19/2006   AMENORRHEA 05/19/2006   Scot Jun, PT, DPT, OCS, ATC 12/22/20  8:45 AM    [facility] [address] [place], [place], [address] Phone: [phone]   Fax:  [phone]  Name: Leslie Davis MRN: [id number] Date of Birth: 17-Feb-1956

## 2020-12-23 ENCOUNTER — Other Ambulatory Visit (INDEPENDENT_AMBULATORY_CARE_PROVIDER_SITE_OTHER): Payer: Medicare Other

## 2020-12-23 ENCOUNTER — Encounter: Payer: Self-pay | Admitting: Nurse Practitioner

## 2020-12-23 ENCOUNTER — Ambulatory Visit (INDEPENDENT_AMBULATORY_CARE_PROVIDER_SITE_OTHER): Payer: Medicare Other | Admitting: Nurse Practitioner

## 2020-12-23 ENCOUNTER — Other Ambulatory Visit: Payer: Self-pay | Admitting: Physician Assistant

## 2020-12-23 VITALS — BP 140/50 | HR 66 | Ht 64.0 in | Wt 190.0 lb

## 2020-12-23 DIAGNOSIS — R1010 Upper abdominal pain, unspecified: Secondary | ICD-10-CM | POA: Diagnosis not present

## 2020-12-23 DIAGNOSIS — R197 Diarrhea, unspecified: Secondary | ICD-10-CM | POA: Diagnosis not present

## 2020-12-23 DIAGNOSIS — K625 Hemorrhage of anus and rectum: Secondary | ICD-10-CM

## 2020-12-23 LAB — BASIC METABOLIC PANEL
BUN: 19 mg/dL (ref 6–23)
CO2: 24 mEq/L (ref 19–32)
Calcium: 9 mg/dL (ref 8.4–10.5)
Chloride: 106 mEq/L (ref 96–112)
Creatinine, Ser: 1.19 mg/dL (ref 0.40–1.20)
GFR: 47.43 mL/min — ABNORMAL LOW (ref >60.00)
Glucose, Bld: 81 mg/dL (ref 70–99)
Potassium: 3.6 mEq/L (ref 3.5–5.1)
Sodium: 139 mEq/L (ref 135–145)

## 2020-12-23 LAB — CBC
HCT: 34.9 % — ABNORMAL LOW (ref 36.0–46.0)
Hemoglobin: 11.2 g/dL — ABNORMAL LOW (ref 12.0–15.0)
MCHC: 32 g/dL (ref 30.0–36.0)
MCV: 85.1 fl (ref 78.0–100.0)
Platelets: 256 10*3/uL (ref 150.0–400.0)
RBC: 4.11 Mil/uL (ref 3.87–5.11)
RDW: 15.4 % (ref 11.5–15.5)
WBC: 5.1 10*3/uL (ref 4.0–10.5)

## 2020-12-23 NOTE — Patient Instructions (Signed)
If you are age 67 or older, your body mass index should be between 23-30. Your Body mass index is 32.61 kg/m. If this is out of the aforementioned range listed, please consider follow up with your Primary Care Provider. __________________________________________________________  The [facility] providers would like to encourage you to use [facility] to communicate with providers for non-urgent requests or questions.  Due to long hold times on the telephone, sending your provider a message by [facility] may be a faster and more efficient way to get a response.  Please allow 48 business hours for a response.  Please remember that this is for non-urgent requests.   Your provider has requested that you go to the basement level for lab work before leaving today. Press "B" on the elevator. The lab is located at the first door on the left as you exit the elevator.  Follow up as needed.  Thank you for entrusting me with your care and choosing [facility].  Tye Savoy, NP-C

## 2020-12-23 NOTE — Progress Notes (Signed)
Chief Complaint:   OBESITY Leslie Davis is here to discuss her progress with her obesity treatment plan along with follow-up of her obesity related diagnoses. Leslie Davis is on the Category 1 Plan and states she is following her eating plan approximately 80% of the time. Leslie Davis states she is doing PT 45 minutes 2 times per week.  Today's visit was #: 9 Starting weight: 208 lbs Starting date: 07/23/2020 Today's weight: 184 lbs Today's date: 12/22/2020 Total lbs lost to date: 24 Total lbs lost since last in-office visit: 0  Interim History: Leslie Davis wasn't eating her meats/proteins at night. She "didn't have a taste for it". Pt had one low blood sugar but is not sure how low but it was the middle of the night and pt only had vegetables for dinner (skipped foods).  Subjective:   1. Type 2 diabetes mellitus with other specified complication, with long-term current use of insulin (HCC) Leslie Davis's fasting blood sugars run 80-101. She is still taking 35 units of Toujeo at night. She forgot to go down to 32 units as we discussed last OV.  She often doesn't eat her evening proteins and just eats veggies or snacks. She had an episode of lower sugar in the middle of the night one time. Medication: Farxiga, Ozempic, Insulin.  2. Vitamin D deficiency She is currently taking prescription vitamin D 50,000 IU every 2 weeks. She denies nausea, vomiting or muscle weakness.    Assessment/Plan:   Medications Discontinued During This Encounter  Medication Reason   Cholecalciferol (VITAMIN D3) 1.25 MG (50000 UT) CAPS Reorder     Meds ordered this encounter  Medications   Cholecalciferol (VITAMIN D3) 1.25 MG (50000 UT) CAPS    Sig: Take 1 capsule by mouth every 2 wks    Dispense:  2 capsule    Refill:  0     1. Type 2 diabetes mellitus with other specified complication, with long-term current use of insulin (HCC) Good blood sugar control is important to decrease the likelihood of diabetic complications  such as nephropathy, neuropathy, limb loss, blindness, coronary artery disease, and death. Intensive lifestyle modification including diet, exercise and weight loss are the first line of treatment for diabetes.  - Pt must not skip protein at night.  - Decrease long acting nighttime insulin to 30 units qhs and decrease by 2 units for every 10 under 100.  - Leslie Davis will discontinue short acting insulin. Medication management per PCP, otherwise. We will closely monitor.     2. Vitamin D deficiency Low Vitamin D level contributes to fatigue and are associated with obesity, breast, and colon cancer. She agrees to continue to take prescription Vitamin D 50,000 IU every 2 weeks and will follow-up for routine testing of Vitamin D, at least 2-3 times per year to avoid over-replacement.  Refill- Cholecalciferol (VITAMIN D3) 1.25 MG (50000 UT) CAPS; Take 1 capsule by mouth every 2 wks  Dispense: 2 capsule; Refill: 0    3. Obesity with current BMI of 31.6 Leslie Davis is currently in the action stage of change. As such, her goal is to continue with weight loss efforts. She has agreed to the Category 1 Plan with protein equivalents.   Okay to eat 12 egg whites for dinner if she doesn't have a taste for fish or meat.   Add protein equivalents.  Exercise goals:  As is  Behavioral modification strategies: meal planning and cooking strategies. Hypoglycemia prevention and treatment of low blood sugars discussed    Leslie Davis  has agreed to follow-up with our clinic in 3 weeks. She was informed of the importance of frequent follow-up visits to maximize her success with intensive lifestyle modifications for her multiple health conditions.    Objective:   Blood pressure (!) 179/66, pulse (!) 53, temperature 97.6 F (36.4 C), height 5\' 4"  (1.626 m), weight 184 lb (83.5 kg), SpO2 98 %. Body mass index is 31.58 kg/m.  General: Cooperative, alert, well developed, in no acute distress. HEENT: Conjunctivae and lids  unremarkable. Cardiovascular: Regular rhythm.  Lungs: Normal work of breathing. Neurologic: No focal deficits.   Lab Results  Component Value Date   CREATININE 1.27 (H) 11/20/2020   BUN 20 11/20/2020   NA 138 11/20/2020   K 4.8 11/20/2020   CL 102 11/20/2020   CO2 22 11/20/2020   Lab Results  Component Value Date   ALT 28 11/20/2020   AST 23 11/20/2020   ALKPHOS 289 (H) 11/20/2020   BILITOT <0.2 11/20/2020   Lab Results  Component Value Date   HGBA1C 6.3 (H) 10/22/2020   HGBA1C 6.8 (H) 08/25/2020   HGBA1C 8.7 (H) 05/28/2020   HGBA1C 8.8 (H) 02/21/2020   HGBA1C 10.5 (H) 11/15/2019   No results found for: INSULIN Lab Results  Component Value Date   TSH 1.790 07/23/2020   Lab Results  Component Value Date   CHOL 156 11/20/2020   HDL 61 11/20/2020   LDLCALC 78 11/20/2020   TRIG 89 11/20/2020   CHOLHDL 2.6 11/20/2020   Lab Results  Component Value Date   VD25OH 102.0 (H) 11/20/2020   VD25OH 95.2 10/22/2020   VD25OH 112.0 (H) 07/23/2020   Lab Results  Component Value Date   WBC 4.4 11/20/2020   HGB 11.7 11/20/2020   HCT 35.0 11/20/2020   MCV 84 11/20/2020   PLT 325 11/20/2020   Lab Results  Component Value Date   IRON 68 11/16/2017   TIBC 355 11/16/2017   FERRITIN 45.2 11/16/2017    Obesity Behavioral Intervention:   Approximately 15 minutes were spent on the discussion below.  ASK: We discussed the diagnosis of obesity with Leslie Davis today and Leslie Davis agreed to give [place] permission to discuss obesity behavioral modification therapy today.  ASSESS: Leslie Davis has the diagnosis of obesity and her BMI today is 31.6. Leslie Davis is in the action stage of change.   ADVISE: Leslie Davis was educated on the multiple health risks of obesity as well as the benefit of weight loss to improve her health. She was advised of the need for long term treatment and the importance of lifestyle modifications to improve her current health and to decrease her risk of future health  problems.  AGREE: Multiple dietary modification options and treatment options were discussed and Leslie Davis agreed to follow the recommendations documented in the above note.  ARRANGE: Leslie Davis was educated on the importance of frequent visits to treat obesity as outlined per CMS and USPSTF guidelines and agreed to schedule her next follow up appointment today.  Attestation Statements:   Reviewed by clinician on day of visit: allergies, medications, problem list, medical history, surgical history, family history, social history, and previous encounter notes.  Coral Ceo, CMA, am acting as transcriptionist for [employer], DO.  I have reviewed the above documentation for accuracy and completeness, and I agree with the above. Marjory Sneddon, D.O.  The [place] was signed into law in 2016 which includes the topic of electronic health records.  This provides immediate access  to information in [place].  This includes consultation notes, operative notes, office notes, lab results and pathology reports.  If you have any questions about what you read please let us know at your next visit so we can discuss your concerns and take corrective action if need be.  We are right here with you.

## 2020-12-23 NOTE — Progress Notes (Signed)
ASSESSMENT AND PLAN    # 65 yo female with recent episode of crampy upper abdominal pain, diarrhea with rectal bleeding. Symptoms lasted one day, she has felt fine for last several days. Etiology unclear. She had an episode of "shakiness" the night before which she thought was from hypoglycemia but that does raise the question of whether she could of had ischemic colitis. However this diagnosis seems unlikely, especially since pain was in upper abdomen. Infectious process is another consideration. Additionally loose stool may have been med related and bleeding hemorrhoidal.  --She looks and feels well now and abdomen is nontender so will hold off imaging ot endoscopic evaluation.  --Obtain BMP, CBC --She will call for any recurrent symptoms at which point abdominal imaging and / or endoscopic evaluation may be warranted.   # Colon cancer screening. Ten year colonoscopy due June 2023.  May need to proceed earlier depending on clinical course, see above  # Chronic constipation, manages with [name]  HISTORY OF PRESENT ILLNESS    Chief Complaint :  abdominal pain, diarrhea with blood last week  Leslie Davis is a 65 y.o. female known to Dr. Rush Landmark with a past medical history significant for erosive gastritis, (H. pylori negative), hiatal hernia, chronic constipation , fatty liver, hypertension, hyperlipidemia. See PMH below for any additional medical problems.    Leslie Davis was last seen here in 2019 when she established care with Dr. Rush Landmark after having previously been followed by Dr. Sharlett Iles.  She has a long history,  greater than 15 years worth of abdominal pain and has been evaluated by [place] for nausea and vomiting as well.  Her last EGD with biopsies August 2019 remarkable for chronic gastritis (no H. Pylori) and mild reflux changes of the esophagus.    INTERVAL HISTORY:  One day last patient developed acute upper abdominal cramping followed almost immediately by passage of  red blood from rectum. This was followed by several episodes of diarrhea lasting throughout the day. Diarrheal stools were black but she  takes oral iron.  She had not had any bismuth. No associated nausea / vomiting. The following day she felt fine.  The night before symptoms started she had an episode of what she feels was hypoglycemia. She didn't check blood glucose but felt shaky. BMs are back to normal ( solid). She takes [name]. She takes a daily baby asa, no other NSAIDS. She takes Pantoprazole QOD which controls reflux symptoms.    PREVIOUS ENDOSCOPIC EVALUATIONS / PERTINENT STUDIES:    Aug 2019 EGD  -Tortuous esophagus. - Normal mucosa was found in the entire esophagus. Biopsied for EoE. - Z-line regular, 37 cm from the incisors. - Small hiatal hernia. - Mild, erythematous mucosa in the greater curvature. No other gross lesions in the stomach.Biopsied for HP. - No gross lesions in the duodenal bulb, in the first portion of the duodenum and in the second portion of the duodenum.   Diagnosis 1. Surgical [P], gastric BXs - CHRONIC GASTRITIS WITH EROSIONS. - NEGATIVE FOR HELICOBACTER PYLORI. - NO INTESTINAL METAPLASIA, DYSPLASIA, OR MALIGNANCY. 2. Surgical [P], esophageal BXs - MILD REFLUX CHANGES. - NO INTESTINAL METAPLASIA, DYSPLASIA, OR MALIGNANCY  June 2013 screening colonoscopy --Complete exam, excellent prep --Normal   Past Medical History:  Diagnosis Date   DM (diabetes mellitus) ([place])    Fatty liver disease, nonalcoholic    Gastritis    Gastroparesis    GERD (gastroesophageal reflux disease)    Heartburn    Hiatal hernia  HLD (hyperlipidemia)    HTN (hypertension)    Joint pain    Renal calculus    Rheumatoid arthritis ([place])    Vitamin D deficiency     Current Medications, Allergies, Past Surgical History, Family History and Social History were reviewed in [employer] record.   Current Outpatient Medications  Medication Sig  Dispense Refill   amLODipine (NORVASC) 10 MG tablet TAKE ONE TABLET BY MOUTH ONCE DAILY 90 tablet 3   aspirin EC 81 MG tablet Take 1 tablet (81 mg total) by mouth 2 (two) times daily. To be taken after surgery 84 tablet 0   Azilsartan Medoxomil (EDARBI) 80 MG TABS TAKE 1 TABLET EACH DAY. 90 tablet 2   Blood Glucose Monitoring Suppl (ONETOUCH VERIO) w/Device KIT Use as directed to check blood sugars 2 times per day dx: e11.65 1 kit 1   cetirizine (ZYRTEC) 10 MG tablet Take 1 tablet (10 mg total) by mouth daily. 90 tablet 2   Cholecalciferol (VITAMIN D3) 1.25 MG (50000 UT) CAPS Take 1 capsule by mouth every 2 wks 2 capsule 0   dapagliflozin propanediol (FARXIGA) 10 MG TABS tablet Take 1 tablet (10 mg total) by mouth daily before breakfast. 30 tablet 2   docusate sodium (COLACE) 100 MG capsule Take 1 capsule (100 mg total) by mouth daily as needed. 30 capsule 2   ferrous gluconate (FERGON) 324 MG tablet TAKE 1 TABLET BY MOUTH TWICE DAILY WITH A MEAL. (Patient taking differently: Take 324 mg by mouth 2 (two) times daily with a meal.) 100 tablet 0   glucose blood (ONETOUCH VERIO) test strip Use as instructed to check blood sugars 2 times per day dx:e11.65 150 each 3   HYDROcodone-acetaminophen (NORCO) 5-325 MG tablet Take 1 tablet by mouth 2 (two) times daily between meals as needed. 30 tablet 0   ibuprofen (ADVIL) 800 MG tablet Take 1 tablet (800 mg total) by mouth every 8 (eight) hours as needed. 30 tablet 2   insulin glargine, 1 Unit Dial, (TOUJEO SOLOSTAR) 300 UNIT/ML Solostar Pen Inject 35 units subcutaneous at bedtime 22.5 mL 1   insulin lispro (HUMALOG KWIKPEN) 100 UNIT/ML KwikPen Sliding scale (Patient taking differently: Inject 4-8 Units into the skin daily as needed (CBG over 150). Sliding scale) 30 mL 2   linaclotide ([name]) 145 MCG CAPS capsule Take 1 capsule (145 mcg total) by mouth daily. 30 capsule 0   OneTouch Delica Lancets 40J MISC Use as directed to check blood sugars 2 times per day  dx:e11.65 150 each 3   pantoprazole (PROTONIX) 40 MG tablet Take one tablet by mouth 3 days a week 90 tablet 2   rosuvastatin (CRESTOR) 20 MG tablet Take 1 tablet (20 mg total) by mouth daily. 90 tablet 1   Semaglutide, 1 MG/DOSE, (OZEMPIC, 1 MG/DOSE,) 4 MG/3ML SOPN Inject 1 mg into the skin once a week. (Patient taking differently: Inject 1 mg into the skin once a week. Tuesday) 9 mL 3   temazepam (RESTORIL) 30 MG capsule One capsules po qhs prn (Patient taking differently: Take 30 mg by mouth at bedtime as needed for sleep.) 60 capsule 0   TRUEPLUS PEN NEEDLES 31G X 6 MM MISC USE WITH pen TO INJECT insulin DAILY 150 each 3   No current facility-administered medications for this visit.    Review of Systems: No chest pain. No shortness of breath. No urinary complaints.   PHYSICAL EXAM :    Wt Readings from Last 3 Encounters:  12/23/20  190 lb (86.2 kg)  12/22/20 184 lb (83.5 kg)  12/17/20 184 lb (83.5 kg)    BP (!) 140/50   Pulse 66   Ht $R'5\' 4"'XL$  (1.626 m)   Wt 190 lb (86.2 kg)   BMI 32.61 kg/m  Constitutional:  Pleasant female in no acute distress. Psychiatric: Normal mood and affect. Behavior is normal. EENT: Pupils normal.  Conjunctivae are normal. No scleral icterus. Neck supple.  Cardiovascular: Normal rate, regular rhythm. No edema Pulmonary/chest: Effort normal and breath sounds normal. No wheezing, rales or rhonchi. Abdominal: Soft, nondistended, nontender. Bowel sounds active throughout. There are no masses palpable. No hepatomegaly. Neurological: Alert and oriented to person place and time. Skin: Skin is warm and dry. No rashes noted.  Tye Savoy, NP  12/23/2020, 3:00 PM

## 2020-12-24 ENCOUNTER — Encounter: Payer: Self-pay | Admitting: Physical Therapy

## 2020-12-24 ENCOUNTER — Telehealth: Payer: Self-pay

## 2020-12-24 ENCOUNTER — Ambulatory Visit (INDEPENDENT_AMBULATORY_CARE_PROVIDER_SITE_OTHER): Payer: Medicare Other | Admitting: Physical Therapy

## 2020-12-24 ENCOUNTER — Telehealth: Payer: Self-pay | Admitting: Orthopaedic Surgery

## 2020-12-24 ENCOUNTER — Other Ambulatory Visit: Payer: Self-pay

## 2020-12-24 DIAGNOSIS — R2689 Other abnormalities of gait and mobility: Secondary | ICD-10-CM

## 2020-12-24 DIAGNOSIS — M25562 Pain in left knee: Secondary | ICD-10-CM | POA: Diagnosis not present

## 2020-12-24 DIAGNOSIS — M25662 Stiffness of left knee, not elsewhere classified: Secondary | ICD-10-CM | POA: Diagnosis not present

## 2020-12-24 DIAGNOSIS — R600 Localized edema: Secondary | ICD-10-CM

## 2020-12-24 DIAGNOSIS — K625 Hemorrhage of anus and rectum: Secondary | ICD-10-CM

## 2020-12-24 DIAGNOSIS — G8929 Other chronic pain: Secondary | ICD-10-CM

## 2020-12-24 DIAGNOSIS — M6281 Muscle weakness (generalized): Secondary | ICD-10-CM

## 2020-12-24 MED ORDER — DAPAGLIFLOZIN PROPANEDIOL 10 MG PO TABS: 10.0000 mg | ORAL_TABLET | Freq: Every day | ORAL | 1 refills | Status: DC

## 2020-12-24 NOTE — Progress Notes (Signed)
Attending Physician's Attestation   I have reviewed the chart.   I agree with the Advanced Practitioner's note, impression, and recommendations with any updates as below.    Justun Anaya Mansouraty, MD [facility] Advanced [facility]oscopy Office # [phone]  

## 2020-12-24 NOTE — Chronic Care Management (AMB) (Signed)
Chronic Care Management Pharmacy Assistant   Name: Leslie Davis  MRN: [id number] DOB: May 14, 1955   Reason for Encounter: Medication Review/ Medication coordination   Recent office visits:  11-25-2020 Lynne Logan, RN (CCM)  12-17-2020 Kellie Simmering, LPN. Medicare annual wellness exam.  Recent consult visits:  11-25-2020 Girtha Rm, PT (Physical therapy). Therapy on left knee.  11-27-2020 Girtha Rm, PT (Physical therapy). Therapy on left knee.  12-01-2020 [name], DO ([facility] weight management). Dietary recommendations.  12-03-2020 Laureen Abrahams, PT (Physical therapy). Therapy on left knee.  12-05-2020 Laureen Abrahams, PT (Physical therapy). Therapy on left knee.  12-09-2020 Girtha Rm, PT (Physical therapy). Therapy on left knee.  12-09-2020 [name], MD (Orthopedic surgery). Xray on left knee.  12-11-2020 Laureen Abrahams, PT (Physical therapy). Therapy on left knee.  12-18-2020 Laureen Abrahams, PT (Physical therapy). Therapy on left knee.  12-22-2020 Girtha Rm, PT (Physical therapy). Therapy on left knee.  12-22-2020 [name], DO ([facility] weight management) Dietary recommendations.  12-24-2020 Willia Craze, NP [facility]). GFR= 47.43. HEMO= 11.2, HCT= 34.9. STOP percocet.   Hospital visits:  None in previous 6 months  Medications: Outpatient Encounter Medications as of 12/24/2020  Medication Sig   amLODipine (NORVASC) 10 MG tablet TAKE ONE TABLET BY MOUTH ONCE DAILY   aspirin EC 81 MG tablet Take 1 tablet (81 mg total) by mouth 2 (two) times daily. To be taken after surgery   Azilsartan Medoxomil (EDARBI) 80 MG TABS TAKE 1 TABLET EACH DAY.   Blood Glucose Monitoring Suppl (ONETOUCH VERIO) w/Device KIT Use as directed to check blood sugars 2 times per day dx: e11.65   cetirizine (ZYRTEC) 10 MG tablet Take 1 tablet (10 mg total) by mouth daily.   Cholecalciferol (VITAMIN D3) 1.25  MG (50000 UT) CAPS Take 1 capsule by mouth every 2 wks   dapagliflozin propanediol (FARXIGA) 10 MG TABS tablet Take 1 tablet (10 mg total) by mouth daily before breakfast.   docusate sodium (COLACE) 100 MG capsule Take 1 capsule (100 mg total) by mouth daily as needed.   ferrous gluconate (FERGON) 324 MG tablet TAKE 1 TABLET BY MOUTH TWICE DAILY WITH A MEAL. (Patient taking differently: Take 324 mg by mouth 2 (two) times daily with a meal.)   glucose blood (ONETOUCH VERIO) test strip Use as instructed to check blood sugars 2 times per day dx:e11.65   HYDROcodone-acetaminophen (NORCO) 5-325 MG tablet Take 1 tablet by mouth 2 (two) times daily between meals as needed.   ibuprofen (ADVIL) 800 MG tablet Take 1 tablet (800 mg total) by mouth every 8 (eight) hours as needed.   insulin glargine, 1 Unit Dial, (TOUJEO SOLOSTAR) 300 UNIT/ML Solostar Pen Inject 35 units subcutaneous at bedtime   insulin lispro (HUMALOG KWIKPEN) 100 UNIT/ML KwikPen Sliding scale (Patient taking differently: Inject 4-8 Units into the skin daily as needed (CBG over 150). Sliding scale)   linaclotide ([name]) 145 MCG CAPS capsule Take 1 capsule (145 mcg total) by mouth daily.   OneTouch Delica Lancets 24S MISC Use as directed to check blood sugars 2 times per day dx:e11.65   pantoprazole (PROTONIX) 40 MG tablet Take one tablet by mouth 3 days a week   rosuvastatin (CRESTOR) 20 MG tablet Take 1 tablet (20 mg total) by mouth daily.   Semaglutide, 1 MG/DOSE, (OZEMPIC, 1 MG/DOSE,) 4 MG/3ML SOPN Inject 1 mg into the skin once a week. (Patient taking differently: Inject 1 mg  into the skin once a week. Tuesday)   temazepam (RESTORIL) 30 MG capsule One capsules po qhs prn (Patient taking differently: Take 30 mg by mouth at bedtime as needed for sleep.)   TRUEPLUS PEN NEEDLES 31G X 6 MM MISC USE WITH pen TO INJECT insulin DAILY   No facility-administered encounter medications on file as of 12/24/2020.   Reviewed chart for medication  changes ahead of medication coordination call.  No OVs, Consults, or hospital visits since last care coordination call/Pharmacist visit. (If appropriate, list visit date, provider name)  No medication changes indicated OR if recent visit, treatment plan here.  BP Readings from Last 3 Encounters:  12/23/20 (!) 140/50  12/22/20 140/60  12/01/20 138/60    Lab Results  Component Value Date   HGBA1C 6.3 (H) 10/22/2020     Patient obtains medications through Vials  90 Days   Last adherence delivery included:  Trueplus Pen 31g 51m- Use with pen to inject insulin daily. Rosuvastatin 20 mg- 1 tablet daily Edarbi 80 mg- 1 tablet daily  Amlodipine 10 mg -1 tablet daily Docusate sodium 100 mg Temazepam 30 mg  Patient declined (meds) last month: None  Patient is due for next adherence delivery on: 01-05-2021 Called patient and reviewed medications and coordinated delivery.  This delivery to include: Test strips Vitamin D3 Colace Farxiga Aspirin Hydrocodone  Patient declined the following medications (meds) due to (reason)  Patient needs refills for: Farxiga  Hydrocodone  Confirmed delivery date of 01-05-2021 advised patient that pharmacy will contact them the morning of delivery.  NOTES:  Patient states she is in need of Aspirin and Hydrocodone for this delivery.  Care Gaps: Shingrix overdue Last Ophthalmology exam 08-01-2020 Last foot exam 11-14-2020 Last flu vaccine 10-20-2020 Covid 19 vaccine overdue last completed 01-31-2020 AWV 12-17-2020  Star Rating Drugs: Rosuvastatin 20 MG- Last filled 12-01-2020 [facility] Ozempic 1 MG- Last filled 09-02-2020 [facility] Azilsartan 80 MG- Last filled 09-04-2020 [facility] FCarbonClinical Pharmacist Assistant [phone]

## 2020-12-24 NOTE — Telephone Encounter (Signed)
The pts pharmacy [facility] called asking to have a refill of the norco 5-325 mg rx. They state they will notify pt when this is called in.

## 2020-12-24 NOTE — Therapy (Signed)
[facility] Therapy [address] [place], [place], [address] Phone: [phone]   Fax:  [phone]  Physical Therapy Treatment  Patient Details  Name: Leslie Davis MRN: [id number] Date of Birth: [birth year] Referring Provider (PT): Frankey Shown MD   Encounter Date: 12/24/2020   PT End of Session - 12/24/20 1030     Visit Number 13    Number of Visits 16    Date for PT Re-Evaluation 01/07/21    Authorization Type UHC MCR    Progress Note Due on Visit 20    PT Start Time 0071    PT Stop Time 1105    PT Time Calculation (min) 50 min    Activity Tolerance Patient tolerated treatment well    Behavior During Therapy WFL for tasks assessed/performed             Past Medical History:  Diagnosis Date   DM (diabetes mellitus) ([place])    Fatty liver disease, nonalcoholic    Gastritis    Gastroparesis    GERD (gastroesophageal reflux disease)    Heartburn    Hiatal hernia    HLD (hyperlipidemia)    HTN (hypertension)    Joint pain    Renal calculus    Rheumatoid arthritis ([place])    Vitamin D deficiency     Past Surgical History:  Procedure Laterality Date   CHOLECYSTECTOMY     COLONOSCOPY     TOTAL KNEE ARTHROPLASTY Left 10/27/2020   Procedure: LEFT TOTAL KNEE ARTHROPLASTY;  Surgeon: [name], MD;  Location: [place];  Service: Orthopedics;  Laterality: Left;   TUBAL LIGATION     UPPER GASTROINTESTINAL ENDOSCOPY      There were no vitals filed for this visit.   Subjective Assessment - 12/24/20 1020     Subjective knee is stiff and sore today    Pertinent History DM, HTN    Patient Stated Goals Get back to my old self; no walker    Currently in Pain? No/denies   "it's just sore"               [facility] PT Assessment - 12/24/20 1033       Assessment   Medical Diagnosis s/p Lt TKR    Referring Provider (PT) Frankey Shown MD    Onset Date/Surgical Date 10/27/20      AROM   Left Knee Extension -4   seated LAQ                           [facility] Adult PT Treatment/Exercise - 12/24/20 1020       Knee/Hip Exercises: Aerobic   Recumbent Bike seat 6 partial revolutions x 8 min      Knee/Hip Exercises: Machines for Strengthening   Cybex Leg Press 37 lbs 3 x 10 Lt      Knee/Hip Exercises: Seated   Long Arc Quad Left;3 sets;15 reps    Long Arc Quad Weight 6 lbs.    Long Arc Quad Limitations 3 sec hold      Knee/Hip Exercises: Supine   Heel Slides AAROM;Left;10 reps      Vasopneumatic   Number Minutes Vasopneumatic  10 minutes    Vasopnuematic Location  Knee    Vasopneumatic Pressure Medium    Vasopneumatic Temperature  34                       PT Short Term Goals - 11/27/20  0825       PT SHORT TERM GOAL #1   Title Pt will be I and compliant with HEP.    Time 4    Period Weeks    Status Achieved      PT SHORT TERM GOAL #2   Title Pt able to safely ambulate community distances without AD    Baseline -    Time 4    Period Weeks    Status Partially Met               PT Long Term Goals - 12/11/20 1007       PT LONG TERM GOAL #1   Title Pt will improve FOTO to >= 70    Time 8    Period Weeks    Status On-going    Target Date 01/07/21      PT LONG TERM GOAL #2   Title Pt will improve Rt knee  AROM to 3-120 deg to normalize gait and transfers.    Time 8    Period Weeks    Status On-going    Target Date 01/07/21      PT LONG TERM GOAL #3   Title Pt will improve Lt hip/knee strength to overall 4+ to improve function.    Time 8    Period Weeks    Status On-going    Target Date 01/07/21      PT LONG TERM GOAL #4   Title Pt will report less than 3-4/10 pain with standing/walking at least 30 minutes and negotiating her 3 steps to enter her house.    Time 8    Period Weeks    Status On-going    Target Date 01/07/21                   Plan - 12/24/20 1059     Clinical Impression Statement Pt demonstrating improved active knee extension  today and still lacking in quad strength at this time.  Anticpapte pt will need 3-4 additional weeks of PT to maximize function.    Personal Factors and Comorbidities Comorbidity 2;Past/Current Experience    Comorbidities DM, HTN    Examination-Activity Limitations Locomotion Level;Transfers    Examination-Participation Restrictions Cleaning;Other   working out at gym   Stability/Clinical Decision Making Stable/Uncomplicated    Rehab Potential Excellent    PT Frequency 2x / week    PT Duration 8 weeks    PT Treatment/Interventions ADLs/Self Care Home Management;Aquatic Therapy;Cryotherapy;Electrical Stimulation;Moist Heat;Balance training;Therapeutic exercise;Therapeutic activities;Functional mobility training;Stair training;Gait training;Patient/family education;Manual techniques;Passive range of motion;Scar mobilization;Taping;Vasopneumatic Device    PT Next Visit Plan WB strengthening, eccentric control LAQ, stair, dynamic balance control; will need recert next week, plan for 3-4 more weeks    PT Home Exercise Plan [id number]    Consulted and Agree with Plan of Care Patient             Patient will benefit from skilled therapeutic intervention in order to improve the following deficits and impairments:  Abnormal gait, Decreased range of motion, Pain, Impaired flexibility, Decreased balance, Decreased strength, Increased edema, Postural dysfunction  Visit Diagnosis: Stiffness of left knee, not elsewhere classified  Chronic pain of left knee  Other abnormalities of gait and mobility  Localized edema  Muscle weakness (generalized)     Problem List Patient Active Problem List   Diagnosis Date Noted   Class 1 obesity due to excess calories with serious comorbidity and body mass index (BMI) of 31.0 to 31.9 in  adult 12/01/2020   Status post total left knee replacement 10/27/2020   Primary osteoarthritis of left knee    NAFLD (nonalcoholic fatty liver disease) 08/06/2020   Other  fatigue 07/23/2020   SOBOE (shortness of breath on exertion) 07/23/2020   Diabetes mellitus ([place]) 07/23/2020   Hypertension associated with type 2 diabetes mellitus ([place]) 07/23/2020   Hyperlipidemia associated with type 2 diabetes mellitus ([place]) 07/23/2020   Absolute anemia 07/23/2020   Vitamin D deficiency 07/23/2020   At risk for heart disease 07/23/2020   AKI (acute kidney injury) ([place]) 09/07/2019   Acute kidney injury ([place]) 09/06/2019   Bradycardia 09/06/2019   Hyperglycemia 09/06/2019   HLD (hyperlipidemia)    Hypertensive nephropathy 05/16/2019   Erosive gastritis 11/16/2017   Fatty liver 11/16/2017   Abdominal pain 12/11/2012   Early satiety 12/11/2012   Hiatal hernia    Hx of cholecystectomy 02/05/2011   Flu-like symptoms 02/05/2011   CONSTIPATION 08/27/2008   FATTY LIVER DISEASE 05/07/2008   Nausea with vomiting 04/23/2008   EPIGASTRIC PAIN 04/23/2008   RENAL CALCULUS 04/22/2008   Type 2 diabetes mellitus with stage 2 chronic kidney disease, with long-term current use of insulin ([place]) 05/19/2006   Type 2 diabetes mellitus with hyperlipidemia ([place]) 05/19/2006   HYPERTENSION, BENIGN SYSTEMIC 05/19/2006   GASTROESOPHAGEAL REFLUX, NO ESOPHAGITIS 05/19/2006   AMENORRHEA 05/19/2006      Laureen Abrahams, PT, DPT 12/24/20 11:01 AM     [facility] [address] [place], [place], [address] Phone: [phone]   Fax:  [phone]  Name: Leslie Davis MRN: [id number] Date of Birth: 1956-02-17

## 2020-12-25 ENCOUNTER — Other Ambulatory Visit: Payer: Self-pay | Admitting: Physician Assistant

## 2020-12-25 ENCOUNTER — Telehealth: Payer: Self-pay | Admitting: *Deleted

## 2020-12-25 MED ORDER — HYDROCODONE-ACETAMINOPHEN 5-325 MG PO TABS: 1.0000 | ORAL_TABLET | Freq: Two times a day (BID) | ORAL | 0 refills | Status: DC | PRN

## 2020-12-25 NOTE — Telephone Encounter (Signed)
VM from patient requesting refill of pain medication; in chart it looks like this has already been done today by PA. Call to patient and updated.LVM

## 2020-12-25 NOTE — Telephone Encounter (Signed)
Sent in

## 2020-12-29 ENCOUNTER — Other Ambulatory Visit (INDEPENDENT_AMBULATORY_CARE_PROVIDER_SITE_OTHER): Payer: Self-pay | Admitting: Family Medicine

## 2020-12-29 ENCOUNTER — Encounter: Payer: Self-pay | Admitting: Physical Therapy

## 2020-12-29 ENCOUNTER — Ambulatory Visit (INDEPENDENT_AMBULATORY_CARE_PROVIDER_SITE_OTHER): Payer: Medicare Other | Admitting: Physical Therapy

## 2020-12-29 ENCOUNTER — Other Ambulatory Visit: Payer: Self-pay

## 2020-12-29 ENCOUNTER — Other Ambulatory Visit: Payer: Self-pay | Admitting: Physician Assistant

## 2020-12-29 DIAGNOSIS — G8929 Other chronic pain: Secondary | ICD-10-CM | POA: Diagnosis not present

## 2020-12-29 DIAGNOSIS — M25662 Stiffness of left knee, not elsewhere classified: Secondary | ICD-10-CM | POA: Diagnosis not present

## 2020-12-29 DIAGNOSIS — M6281 Muscle weakness (generalized): Secondary | ICD-10-CM

## 2020-12-29 DIAGNOSIS — R2689 Other abnormalities of gait and mobility: Secondary | ICD-10-CM | POA: Diagnosis not present

## 2020-12-29 DIAGNOSIS — R600 Localized edema: Secondary | ICD-10-CM | POA: Diagnosis not present

## 2020-12-29 DIAGNOSIS — M25562 Pain in left knee: Secondary | ICD-10-CM | POA: Diagnosis not present

## 2020-12-29 DIAGNOSIS — E559 Vitamin D deficiency, unspecified: Secondary | ICD-10-CM

## 2020-12-29 NOTE — Therapy (Signed)
[facility] Therapy [address] [place], [place], [address] Phone: [phone]   Fax:  [phone]  Physical Therapy Treatment  Patient Details  Name: Leslie Davis MRN: [id number] Date of Birth: [birth year] Referring Provider (PT): Frankey Shown MD   Encounter Date: 12/29/2020   PT End of Session - 12/29/20 0943     Visit Number 14    Number of Visits 16    Date for PT Re-Evaluation 01/07/21    Authorization Type UHC MCR    Progress Note Due on Visit 39    PT Start Time 0938    PT Stop Time 1026    PT Time Calculation (min) 48 min    Activity Tolerance Patient tolerated treatment well    Behavior During Therapy [facility] for tasks assessed/performed             Past Medical History:  Diagnosis Date   DM (diabetes mellitus) ([place])    Fatty liver disease, nonalcoholic    Gastritis    Gastroparesis    GERD (gastroesophageal reflux disease)    Heartburn    Hiatal hernia    HLD (hyperlipidemia)    HTN (hypertension)    Joint pain    Renal calculus    Rheumatoid arthritis ([place])    Vitamin D deficiency     Past Surgical History:  Procedure Laterality Date   CHOLECYSTECTOMY     COLONOSCOPY     TOTAL KNEE ARTHROPLASTY Left 10/27/2020   Procedure: LEFT TOTAL KNEE ARTHROPLASTY;  Surgeon: [name], MD;  Location: [place];  Service: Orthopedics;  Laterality: Left;   TUBAL LIGATION     UPPER GASTROINTESTINAL ENDOSCOPY      There were no vitals filed for this visit.   Subjective Assessment - 12/29/20 0938     Subjective Her leg swells when on feet too long (~1 hour).  She applies ice to reduce swelling.    Pertinent History DM, HTN    Patient Stated Goals Get back to my old self; no walker    Currently in Pain? Yes    Pain Score 3     Pain Location Knee    Pain Orientation Left    Pain Descriptors / Indicators Tightness    Pain Type Acute pain    Pain Onset More than a month ago    Pain Frequency Intermittent    Aggravating Factors   prolonged activity (sitting, standing, walkint)    Pain Relieving Factors changing position    Effect of Pain on Daily Activities unable to do normal chores                [facility] PT Assessment - 12/29/20 0001       Assessment   Medical Diagnosis s/p Lt TKR    Referring Provider (PT) Frankey Shown MD    Onset Date/Surgical Date 10/27/20      AROM   Left Knee Extension -4   seated LAQ     PROM   Left Knee Flexion 97   supine AAROM with ball & strap                          [facility] Adult PT Treatment/Exercise - 12/29/20 0938       Knee/Hip Exercises: Stretches   Gastroc Stretch Left;2 reps;20 seconds    Gastroc Stretch Limitations standing heel depression off step      Knee/Hip Exercises: Aerobic   Recumbent Bike seat 6 partial revolutions  x 6 min then seat 7 able to do full revolutions backwards only for 1.5 minutes then forward 30 sec      Knee/Hip Exercises: Machines for Strengthening   Cybex Leg Press 43 lbs 3 x 10 LLE  slow eccentric & fast concentric      Knee/Hip Exercises: Standing   Forward Step Up Left;1 set;10 reps;Hand Hold: 1;Step Height: 4"    Forward Step Up Limitations PT demo & verbal cues on technique    Step Down Left;1 set;10 reps;Hand Hold: 1;Step Height: 4"    Step Down Limitations PT demo & verbal cues on technique      Knee/Hip Exercises: Seated   Long Arc Quad Left;3 sets;15 reps   3 sec hold   Long Arc Quad Weight 6 lbs.    Long [employer] Limitations verbal cues on technique to not use trunk motion to assist      Knee/Hip Exercises: Supine   Heel Slides AAROM;Left;10 reps    Heel Slides Limitations foot on ball with strap assist      Vasopneumatic   Number Minutes Vasopneumatic  10 minutes    Vasopnuematic Location  Knee    Vasopneumatic Pressure Medium    Vasopneumatic Temperature  34                       PT Short Term Goals - 11/27/20 0825       PT SHORT TERM GOAL #1   Title Pt will be I and compliant  with HEP.    Time 4    Period Weeks    Status Achieved      PT SHORT TERM GOAL #2   Title Pt able to safely ambulate community distances without AD    Baseline -    Time 4    Period Weeks    Status Partially Met               PT Long Term Goals - 12/11/20 1007       PT LONG TERM GOAL #1   Title Pt will improve FOTO to >= 70    Time 8    Period Weeks    Status On-going    Target Date 01/07/21      PT LONG TERM GOAL #2   Title Pt will improve Rt knee  AROM to 3-120 deg to normalize gait and transfers.    Time 8    Period Weeks    Status On-going    Target Date 01/07/21      PT LONG TERM GOAL #3   Title Pt will improve Lt hip/knee strength to overall 4+ to improve function.    Time 8    Period Weeks    Status On-going    Target Date 01/07/21      PT LONG TERM GOAL #4   Title Pt will report less than 3-4/10 pain with standing/walking at least 30 minutes and negotiating her 3 steps to enter her house.    Time 8    Period Weeks    Status On-going    Target Date 01/07/21                   Plan - 12/29/20 0943     Clinical Impression Statement PT added step-up & down to functional exercises and she improved left knee control with instruction.  Pt has stiffness of knee initally in session but slowly improved functional range with exercises.  Personal Factors and Comorbidities Comorbidity 2;Past/Current Experience    Comorbidities DM, HTN    Examination-Activity Limitations Locomotion Level;Transfers    Examination-Participation Restrictions Cleaning;Other   working out at gym   Stability/Clinical Decision Making Stable/Uncomplicated    Rehab Potential Excellent    PT Frequency 2x / week    PT Duration 8 weeks    PT Treatment/Interventions ADLs/Self Care Home Management;Aquatic Therapy;Cryotherapy;Electrical Stimulation;Moist Heat;Balance training;Therapeutic exercise;Therapeutic activities;Functional mobility training;Stair training;Gait  training;Patient/family education;Manual techniques;Passive range of motion;Scar mobilization;Taping;Vasopneumatic Device    PT Next Visit Plan continue WB strengthening, eccentric control LAQ, stair, dynamic balance control; will need recert this week, plan for 3-4 more weeks    PT Home Exercise Plan [id number]    Consulted and Agree with Plan of Care Patient             Patient will benefit from skilled therapeutic intervention in order to improve the following deficits and impairments:  Abnormal gait, Decreased range of motion, Pain, Impaired flexibility, Decreased balance, Decreased strength, Increased edema, Postural dysfunction  Visit Diagnosis: Stiffness of left knee, not elsewhere classified  Chronic pain of left knee  Localized edema  Muscle weakness (generalized)  Other abnormalities of gait and mobility     Problem List Patient Active Problem List   Diagnosis Date Noted   Class 1 obesity due to excess calories with serious comorbidity and body mass index (BMI) of 31.0 to 31.9 in adult 12/01/2020   Status post total left knee replacement 10/27/2020   Primary osteoarthritis of left knee    NAFLD (nonalcoholic fatty liver disease) 08/06/2020   Other fatigue 07/23/2020   SOBOE (shortness of breath on exertion) 07/23/2020   Diabetes mellitus ([place]) 07/23/2020   Hypertension associated with type 2 diabetes mellitus ([place]) 07/23/2020   Hyperlipidemia associated with type 2 diabetes mellitus ([place]) 07/23/2020   Absolute anemia 07/23/2020   Vitamin D deficiency 07/23/2020   At risk for heart disease 07/23/2020   AKI (acute kidney injury) ([place]) 09/07/2019   Acute kidney injury ([place]) 09/06/2019   Bradycardia 09/06/2019   Hyperglycemia 09/06/2019   HLD (hyperlipidemia)    Hypertensive nephropathy 05/16/2019   Erosive gastritis 11/16/2017   Fatty liver 11/16/2017   Abdominal pain 12/11/2012   Early satiety 12/11/2012   Hiatal hernia    Hx of cholecystectomy 02/05/2011    Flu-like symptoms 02/05/2011   CONSTIPATION 08/27/2008   FATTY LIVER DISEASE 05/07/2008   Nausea with vomiting 04/23/2008   EPIGASTRIC PAIN 04/23/2008   RENAL CALCULUS 04/22/2008   Type 2 diabetes mellitus with stage 2 chronic kidney disease, with long-term current use of insulin ([place]) 05/19/2006   Type 2 diabetes mellitus with hyperlipidemia ([place]) 05/19/2006   HYPERTENSION, BENIGN SYSTEMIC 05/19/2006   GASTROESOPHAGEAL REFLUX, NO ESOPHAGITIS 05/19/2006   AMENORRHEA 05/19/2006    Jamey Reas, PT, DPT 12/29/2020, 10:12 AM  [facility] Therapy [address] [place], [place], [address] Phone: [phone]   Fax:  [phone]  Name: LORINE IANNACCONE MRN: [id number] Date of Birth: [birth year]

## 2020-12-31 ENCOUNTER — Other Ambulatory Visit: Payer: Self-pay

## 2020-12-31 ENCOUNTER — Ambulatory Visit (INDEPENDENT_AMBULATORY_CARE_PROVIDER_SITE_OTHER): Payer: Medicare Other | Admitting: Physical Therapy

## 2020-12-31 ENCOUNTER — Encounter: Payer: Self-pay | Admitting: Physical Therapy

## 2020-12-31 DIAGNOSIS — R600 Localized edema: Secondary | ICD-10-CM | POA: Diagnosis not present

## 2020-12-31 DIAGNOSIS — M25562 Pain in left knee: Secondary | ICD-10-CM

## 2020-12-31 DIAGNOSIS — M25662 Stiffness of left knee, not elsewhere classified: Secondary | ICD-10-CM | POA: Diagnosis not present

## 2020-12-31 DIAGNOSIS — M6281 Muscle weakness (generalized): Secondary | ICD-10-CM

## 2020-12-31 DIAGNOSIS — G8929 Other chronic pain: Secondary | ICD-10-CM | POA: Diagnosis not present

## 2020-12-31 DIAGNOSIS — R2689 Other abnormalities of gait and mobility: Secondary | ICD-10-CM | POA: Diagnosis not present

## 2020-12-31 NOTE — Therapy (Signed)
[facility] [address] [place], [place], [address] Phone: [phone]   Fax:  [phone]  Physical Therapy Treatment  Patient Details  Name: Leslie Davis MRN: [id number] Date of Birth: Feb 05, 1956 Referring Provider (PT): Frankey Shown MD   Encounter Date: 12/31/2020   PT End of Session - 12/31/20 1133     Visit Number 15    Number of Visits 16    Date for PT Re-Evaluation 01/07/21    Authorization Type UHC MCR    Progress Note Due on Visit 20    PT Start Time 0930    PT Stop Time 1020    PT Time Calculation (min) 50 min    Activity Tolerance Patient tolerated treatment well    Behavior During Therapy WFL for tasks assessed/performed             Past Medical History:  Diagnosis Date   DM (diabetes mellitus) ([place])    Fatty liver disease, nonalcoholic    Gastritis    Gastroparesis    GERD (gastroesophageal reflux disease)    Heartburn    Hiatal hernia    HLD (hyperlipidemia)    HTN (hypertension)    Joint pain    Renal calculus    Rheumatoid arthritis ([place])    Vitamin D deficiency     Past Surgical History:  Procedure Laterality Date   CHOLECYSTECTOMY     COLONOSCOPY     TOTAL KNEE ARTHROPLASTY Left 10/27/2020   Procedure: LEFT TOTAL KNEE ARTHROPLASTY;  Surgeon: [name], MD;  Location: [facility];  Service: Orthopedics;  Laterality: Left;   TUBAL LIGATION     UPPER GASTROINTESTINAL ENDOSCOPY      There were no vitals filed for this visit.   Subjective Assessment - 12/31/20 0932     Subjective Doing well - no c/o of pain today    Pertinent History DM, HTN    Patient Stated Goals Get back to my old self; no walker    Currently in Pain? No/denies                               [facility] Adult PT Treatment/Exercise - 12/31/20 0932       Knee/Hip Exercises: Aerobic   Recumbent Bike seat 6 partial revolutions x 8 min      Knee/Hip Exercises: Machines for Strengthening   Cybex Leg Press 50#  3x10 LLE only; 1 min flexion hold between sets      Knee/Hip Exercises: Standing   Forward Step Up Left;1 set;10 reps;Hand Hold: 1;Step Height: 6"    Step Down Left;1 set;10 reps;Hand Hold: 1;Step Height: 4"      Knee/Hip Exercises: Seated   Long Arc Quad Left;3 sets;15 reps    Long Arc Quad Weight 7 lbs.    Long Arc Quad Limitations 5 sec hold      Vasopneumatic   Number Minutes Vasopneumatic  10 minutes    Vasopnuematic Location  Knee    Vasopneumatic Pressure Medium    Vasopneumatic Temperature  34                       PT Short Term Goals - 11/27/20 0825       PT SHORT TERM GOAL #1   Title Pt will be I and compliant with HEP.    Time 4    Period Weeks    Status Achieved  PT SHORT TERM GOAL #2   Title Pt able to safely ambulate community distances without AD    Baseline -    Time 4    Period Weeks    Status Partially Met               PT Long Term Goals - 12/11/20 1007       PT LONG TERM GOAL #1   Title Pt will improve FOTO to >= 70    Time 8    Period Weeks    Status On-going    Target Date 01/07/21      PT LONG TERM GOAL #2   Title Pt will improve Rt knee  AROM to 3-120 deg to normalize gait and transfers.    Time 8    Period Weeks    Status On-going    Target Date 01/07/21      PT LONG TERM GOAL #3   Title Pt will improve Lt hip/knee strength to overall 4+ to improve function.    Time 8    Period Weeks    Status On-going    Target Date 01/07/21      PT LONG TERM GOAL #4   Title Pt will report less than 3-4/10 pain with standing/walking at least 30 minutes and negotiating her 3 steps to enter her house.    Time 8    Period Weeks    Status On-going    Target Date 01/07/21                   Plan - 12/31/20 1134     Clinical Impression Statement Pt tolerated sesion well today, and still demonstrating decreased quad strength and knee flexion AROM.  She is nearing the end of her POC but may need to continue 3-4  more weeks to maximize progress.  Will plan to reassess next week.    Personal Factors and Comorbidities Comorbidity 2;Past/Current Experience    Comorbidities DM, HTN    Examination-Activity Limitations Locomotion Level;Transfers    Examination-Participation Restrictions Cleaning;Other   working out at gym   Stability/Clinical Decision Making Stable/Uncomplicated    Rehab Potential Excellent    PT Frequency 2x / week    PT Duration 8 weeks    PT Treatment/Interventions ADLs/Self Care Home Management;Aquatic Therapy;Cryotherapy;Electrical Stimulation;Moist Heat;Balance training;Therapeutic exercise;Therapeutic activities;Functional mobility training;Stair training;Gait training;Patient/family education;Manual techniques;Passive range of motion;Scar mobilization;Taping;Vasopneumatic Device    PT Next Visit Plan needs recert, aggressive flexion, quad strengthening; standing balance    PT Home Exercise Plan [id number]    Consulted and Agree with Plan of Care Patient             Patient will benefit from skilled therapeutic intervention in order to improve the following deficits and impairments:  Abnormal gait, Decreased range of motion, Pain, Impaired flexibility, Decreased balance, Decreased strength, Increased edema, Postural dysfunction  Visit Diagnosis: Stiffness of left knee, not elsewhere classified  Chronic pain of left knee  Localized edema  Muscle weakness (generalized)  Other abnormalities of gait and mobility     Problem List Patient Active Problem List   Diagnosis Date Noted   Class 1 obesity due to excess calories with serious comorbidity and body mass index (BMI) of 31.0 to 31.9 in adult 12/01/2020   Status post total left knee replacement 10/27/2020   Primary osteoarthritis of left knee    NAFLD (nonalcoholic fatty liver disease) 08/06/2020   Other fatigue 07/23/2020   SOBOE (shortness of breath on exertion) 07/23/2020  Diabetes mellitus ([place]) 07/23/2020    Hypertension associated with type 2 diabetes mellitus ([place]) 07/23/2020   Hyperlipidemia associated with type 2 diabetes mellitus ([place]) 07/23/2020   Absolute anemia 07/23/2020   Vitamin D deficiency 07/23/2020   At risk for heart disease 07/23/2020   AKI (acute kidney injury) ([place]) 09/07/2019   Acute kidney injury ([place]) 09/06/2019   Bradycardia 09/06/2019   Hyperglycemia 09/06/2019   HLD (hyperlipidemia)    Hypertensive nephropathy 05/16/2019   Erosive gastritis 11/16/2017   Fatty liver 11/16/2017   Abdominal pain 12/11/2012   Early satiety 12/11/2012   Hiatal hernia    Hx of cholecystectomy 02/05/2011   Flu-like symptoms 02/05/2011   CONSTIPATION 08/27/2008   FATTY LIVER DISEASE 05/07/2008   Nausea with vomiting 04/23/2008   EPIGASTRIC PAIN 04/23/2008   RENAL CALCULUS 04/22/2008   Type 2 diabetes mellitus with stage 2 chronic kidney disease, with long-term current use of insulin ([place]) 05/19/2006   Type 2 diabetes mellitus with hyperlipidemia ([place]) 05/19/2006   HYPERTENSION, BENIGN SYSTEMIC 05/19/2006   GASTROESOPHAGEAL REFLUX, NO ESOPHAGITIS 05/19/2006   AMENORRHEA 05/19/2006      Laureen Abrahams, PT, DPT 12/31/20 11:36 AM     [facility] [address] [place], [place], [address] Phone: [phone]   Fax:  [phone]  Name: Leslie Davis MRN: [id number] Date of Birth: May 12, 1955

## 2021-01-06 ENCOUNTER — Encounter: Payer: Self-pay | Admitting: Physical Therapy

## 2021-01-06 ENCOUNTER — Other Ambulatory Visit: Payer: Self-pay

## 2021-01-06 ENCOUNTER — Ambulatory Visit (INDEPENDENT_AMBULATORY_CARE_PROVIDER_SITE_OTHER): Payer: Medicare Other | Admitting: Physical Therapy

## 2021-01-06 DIAGNOSIS — M25562 Pain in left knee: Secondary | ICD-10-CM

## 2021-01-06 DIAGNOSIS — M25662 Stiffness of left knee, not elsewhere classified: Secondary | ICD-10-CM | POA: Diagnosis not present

## 2021-01-06 DIAGNOSIS — M6281 Muscle weakness (generalized): Secondary | ICD-10-CM

## 2021-01-06 DIAGNOSIS — R600 Localized edema: Secondary | ICD-10-CM

## 2021-01-06 DIAGNOSIS — R2689 Other abnormalities of gait and mobility: Secondary | ICD-10-CM

## 2021-01-06 DIAGNOSIS — G8929 Other chronic pain: Secondary | ICD-10-CM

## 2021-01-06 NOTE — Therapy (Signed)
[facility] Therapy [address] [place], [place], [address] Phone: [phone]   Fax:  [phone]  Physical Therapy Treatment/Recertification  Patient Details  Name: Leslie Davis MRN: [id number] Date of Birth: October 27, 1955 Referring Provider (PT): Frankey Shown MD   Encounter Date: 01/06/2021   PT End of Session - 01/06/21 1243     Visit Number 16    Number of Visits 24    Date for PT Re-Evaluation 02/03/21    Authorization Type UHC MCR    Progress Note Due on Visit 20    PT Start Time 1058    PT Stop Time 1137    PT Time Calculation (min) 39 min    Activity Tolerance Patient tolerated treatment well    Behavior During Therapy WFL for tasks assessed/performed             Past Medical History:  Diagnosis Date   DM (diabetes mellitus) ([place])    Fatty liver disease, nonalcoholic    Gastritis    Gastroparesis    GERD (gastroesophageal reflux disease)    Heartburn    Hiatal hernia    HLD (hyperlipidemia)    HTN (hypertension)    Joint pain    Renal calculus    Rheumatoid arthritis ([place])    Vitamin D deficiency     Past Surgical History:  Procedure Laterality Date   CHOLECYSTECTOMY     COLONOSCOPY     TOTAL KNEE ARTHROPLASTY Left 10/27/2020   Procedure: LEFT TOTAL KNEE ARTHROPLASTY;  Surgeon: [name], MD;  Location: [facility];  Service: Orthopedics;  Laterality: Left;   TUBAL LIGATION     UPPER GASTROINTESTINAL ENDOSCOPY      There were no vitals filed for this visit.   Subjective Assessment - 01/06/21 1105     Subjective no pain, knee is still stiff and limited    Pertinent History DM, HTN    Patient Stated Goals Get back to my old self; no walker    Currently in Pain? No/denies                [facility] PT Assessment - 01/06/21 1111       Assessment   Medical Diagnosis s/p Lt TKR    Referring Provider (PT) Frankey Shown MD    Onset Date/Surgical Date 10/27/20    Hand Dominance Right    Next MD Visit 01/20/21       Observation/Other Assessments   Focus on Therapeutic Outcomes (FOTO)  59      AROM   Left Knee Extension -4   seated LAQ   Left Knee Flexion 92      PROM   Left Knee Extension 0    Left Knee Flexion 95      Strength   Overall Strength Comments Lt knee ext 3-/5                           [facility] Adult PT Treatment/Exercise - 01/06/21 1100       Ambulation/Gait   Stairs Yes    Stairs Assistance 6: Modified independent (Device/Increase time)    Stair Management Technique Two rails;Alternating pattern    Number of Stairs 6    Height of Stairs 8    Gait Comments heavy reliance on UEs      Knee/Hip Exercises: Stretches   Knee: Self-Stretch Limitations seated flexion overpressure c Rt leg - 10-15 seconds x 10 reps      Knee/Hip  Exercises: Aerobic   Recumbent Bike seat 6 partial revolutions x 8 min      Knee/Hip Exercises: Seated   Long Arc Quad Left    Long Arc Quad Limitations 5 sec hold; prior to measurement      Manual Therapy   Manual therapy comments seated Lt knee flexion c distraction/IR mobilization c movement c contralateral leg movement opposite.                       PT Short Term Goals - 11/27/20 0825       PT SHORT TERM GOAL #1   Title Pt will be I and compliant with HEP.    Time 4    Period Weeks    Status Achieved      PT SHORT TERM GOAL #2   Title Pt able to safely ambulate community distances without AD    Baseline -    Time 4    Period Weeks    Status Partially Met               PT Long Term Goals - 01/06/21 1244       PT LONG TERM GOAL #1   Title Pt will improve FOTO to >= 70    Time 4    Period Weeks    Status On-going    Target Date 02/03/21      PT LONG TERM GOAL #2   Title Pt will improve Rt knee  AROM to 3-120 deg to normalize gait and transfers.    Time 4    Period Weeks    Status On-going    Target Date 02/03/21      PT LONG TERM GOAL #3   Title Pt will improve Lt hip/knee strength to  overall 4+ to improve function.    Time 4    Period Weeks    Status On-going    Target Date 02/03/21      PT LONG TERM GOAL #4   Title Pt will report less than 3-4/10 pain with standing/walking at least 30 minutes and negotiating her 3 steps to enter her house.    Time 8    Period Weeks    Status Achieved                   Plan - 01/06/21 1244     Clinical Impression Statement Pt has met 1 LTG at this time with progress noted towards all LTGs.  Reinforced continued work on aggressive ROM at home to maximize progress at this time as we seem to have slightly plateaued.  Will plan to see 2x/wk x 4 more weeks to maximize progress and functional moiblity.    Personal Factors and Comorbidities Comorbidity 2;Past/Current Experience    Comorbidities DM, HTN    Examination-Activity Limitations Locomotion Level;Transfers    Examination-Participation Restrictions Cleaning;Other   working out at gym   Stability/Clinical Decision Making Stable/Uncomplicated    Rehab Potential Excellent    PT Frequency 2x / week    PT Duration 4 weeks    PT Treatment/Interventions ADLs/Self Care Home Management;Aquatic Therapy;Cryotherapy;Electrical Stimulation;Moist Heat;Balance training;Therapeutic exercise;Therapeutic activities;Functional mobility training;Stair training;Gait training;Patient/family education;Manual techniques;Passive range of motion;Scar mobilization;Taping;Vasopneumatic Device    PT Next Visit Plan aggressive flexion, quad strengthening; standing balance    PT Home Exercise Plan [id number]    Consulted and Agree with Plan of Care Patient             Patient will benefit  from skilled therapeutic intervention in order to improve the following deficits and impairments:  Abnormal gait, Decreased range of motion, Pain, Impaired flexibility, Decreased balance, Decreased strength, Increased edema, Postural dysfunction  Visit Diagnosis: Stiffness of left knee, not elsewhere classified -  Plan: PT plan of care cert/re-cert  Chronic pain of left knee - Plan: PT plan of care cert/re-cert  Localized edema - Plan: PT plan of care cert/re-cert  Muscle weakness (generalized) - Plan: PT plan of care cert/re-cert  Other abnormalities of gait and mobility - Plan: PT plan of care cert/re-cert     Problem List Patient Active Problem List   Diagnosis Date Noted   Class 1 obesity due to excess calories with serious comorbidity and body mass index (BMI) of 31.0 to 31.9 in adult 12/01/2020   Status post total left knee replacement 10/27/2020   Primary osteoarthritis of left knee    NAFLD (nonalcoholic fatty liver disease) 08/06/2020   Other fatigue 07/23/2020   SOBOE (shortness of breath on exertion) 07/23/2020   Diabetes mellitus ([place]) 07/23/2020   Hypertension associated with type 2 diabetes mellitus ([place]) 07/23/2020   Hyperlipidemia associated with type 2 diabetes mellitus ([place]) 07/23/2020   Absolute anemia 07/23/2020   Vitamin D deficiency 07/23/2020   At risk for heart disease 07/23/2020   AKI (acute kidney injury) ([place]) 09/07/2019   Acute kidney injury ([place]) 09/06/2019   Bradycardia 09/06/2019   Hyperglycemia 09/06/2019   HLD (hyperlipidemia)    Hypertensive nephropathy 05/16/2019   Erosive gastritis 11/16/2017   Fatty liver 11/16/2017   Abdominal pain 12/11/2012   Early satiety 12/11/2012   Hiatal hernia    Hx of cholecystectomy 02/05/2011   Flu-like symptoms 02/05/2011   CONSTIPATION 08/27/2008   FATTY LIVER DISEASE 05/07/2008   Nausea with vomiting 04/23/2008   EPIGASTRIC PAIN 04/23/2008   RENAL CALCULUS 04/22/2008   Type 2 diabetes mellitus with stage 2 chronic kidney disease, with long-term current use of insulin ([place]) 05/19/2006   Type 2 diabetes mellitus with hyperlipidemia ([place]) 05/19/2006   HYPERTENSION, BENIGN SYSTEMIC 05/19/2006   GASTROESOPHAGEAL REFLUX, NO ESOPHAGITIS 05/19/2006   AMENORRHEA 05/19/2006     Laureen Abrahams, PT,  DPT 01/06/21 12:49 PM    [facility] [address] [place], [place], [address] Phone: [phone]   Fax:  [phone]  Name: Leslie Davis MRN: [id number] Date of Birth: 1955/06/09

## 2021-01-08 ENCOUNTER — Ambulatory Visit (INDEPENDENT_AMBULATORY_CARE_PROVIDER_SITE_OTHER): Payer: Medicare Other | Admitting: Physical Therapy

## 2021-01-08 ENCOUNTER — Encounter: Payer: Self-pay | Admitting: Physical Therapy

## 2021-01-08 ENCOUNTER — Other Ambulatory Visit: Payer: Self-pay

## 2021-01-08 DIAGNOSIS — M25662 Stiffness of left knee, not elsewhere classified: Secondary | ICD-10-CM

## 2021-01-08 DIAGNOSIS — M25562 Pain in left knee: Secondary | ICD-10-CM | POA: Diagnosis not present

## 2021-01-08 DIAGNOSIS — G8929 Other chronic pain: Secondary | ICD-10-CM

## 2021-01-08 DIAGNOSIS — R2689 Other abnormalities of gait and mobility: Secondary | ICD-10-CM | POA: Diagnosis not present

## 2021-01-08 DIAGNOSIS — M6281 Muscle weakness (generalized): Secondary | ICD-10-CM | POA: Diagnosis not present

## 2021-01-08 DIAGNOSIS — R600 Localized edema: Secondary | ICD-10-CM

## 2021-01-08 NOTE — Therapy (Signed)
[facility] [address] [place], [place], [address] Phone: [phone]   Fax:  [phone]  Physical Therapy Treatment  Patient Details  Name: Leslie Davis MRN: [id number] Date of Birth: [birth year] Referring Provider (PT): Frankey Shown MD   Encounter Date: 01/08/2021   PT End of Session - 01/08/21 1012     Visit Number 17    Number of Visits 24    Date for PT Re-Evaluation 02/03/21    Authorization Type UHC MCR    Progress Note Due on Visit 69    PT Start Time 1012    PT Stop Time 1103    PT Time Calculation (min) 51 min    Activity Tolerance Patient tolerated treatment well    Behavior During Therapy WFL for tasks assessed/performed             Past Medical History:  Diagnosis Date   DM (diabetes mellitus) ([place])    Fatty liver disease, nonalcoholic    Gastritis    Gastroparesis    GERD (gastroesophageal reflux disease)    Heartburn    Hiatal hernia    HLD (hyperlipidemia)    HTN (hypertension)    Joint pain    Renal calculus    Rheumatoid arthritis ([place])    Vitamin D deficiency     Past Surgical History:  Procedure Laterality Date   CHOLECYSTECTOMY     COLONOSCOPY     TOTAL KNEE ARTHROPLASTY Left 10/27/2020   Procedure: LEFT TOTAL KNEE ARTHROPLASTY;  Surgeon: [name], MD;  Location: [facility];  Service: Orthopedics;  Laterality: Left;   TUBAL LIGATION     UPPER GASTROINTESTINAL ENDOSCOPY      There were no vitals filed for this visit.   Subjective Assessment - 01/08/21 1013     Subjective She has been doing her exercises. Her knee gets stiff between exercising bouts during day.    Pertinent History DM, HTN    Patient Stated Goals Get back to my old self; no walker    Currently in Pain? No/denies                [facility] PT Assessment - 01/08/21 1015       Assessment   Medical Diagnosis s/p Lt TKR    Referring Provider (PT) Frankey Shown MD    Onset Date/Surgical Date 10/27/20      AROM   Left Knee  Extension -4   seated LAQ   Left Knee Flexion 94   seated     PROM   Left Knee Extension 0    Left Knee Flexion 101   seated after manual therapy     Ambulation/Gait   Stairs --    Stairs Assistance --    Stair Management Technique --    Number of Stairs --    Height of Stairs --                           [facility] Adult PT Treatment/Exercise - 01/08/21 1015       Ambulation/Gait   Ambulation/Gait Yes    Ambulation/Gait Assistance Details verbal cues on increasing pace with quicker & longer step.    Assistive device None      Exercises   Exercises Other Exercises    Other Exercises  PT reviewed HEP with cues to break up during day for less time between knee movement. Pt verbalized & return demo understanding.  Knee/Hip Exercises: Stretches   Active Hamstring Stretch Left;2 reps;30 seconds    Active Hamstring Stretch Limitations seated knee ext with trunk flex    Knee: Self-Stretch Limitations Lt foot in secure chair with forward lean 20 sec hold 10 reps    Gastroc Stretch Left;2 reps;30 seconds    Gastroc Stretch Limitations standing on step with heel depression    Other Knee/Hip Stretches sitting with knee bent max amount when sitting at home as long as tolerabled then sitting with knee extended for 5 min.  Pt verbalized understanding.      Knee/Hip Exercises: Aerobic   Recumbent Bike seat 6 partial revolutions x 8 min      Knee/Hip Exercises: Standing   Forward Step Up Left;10 reps;Hand Hold: 1;Step Height: 6"   stepping backward to floor with LLE to facilitate knee ext   Forward Step Up Limitations PT demo & verbal cues on technique    Step Down Left;1 set;10 reps;Hand Hold: 1;Step Height: 6"    Step Down Limitations PT demo & verbal cues on technique    SLS with Vectors standing on foam Y reaching to targets with demo & verbal cues to flex knee with reaching.  Pt minimally flexed knee but would unlock knee      Knee/Hip Exercises: Seated   Long Arc  Quad Left;1 set;15 reps   5 sec hold   Long Arc Quad Limitations using strap for end range ext, then quad set to hold without using strap for eccentric    Sit to Sand 10 reps;without UE support   from 28' Chair, PT demo & verbal cues on technique     Manual Therapy   Manual therapy comments seated Lt knee flexion c distraction/IR mobilization c movement c contralateral leg movement opposite.                       PT Short Term Goals - 11/27/20 0825       PT SHORT TERM GOAL #1   Title Pt will be I and compliant with HEP.    Time 4    Period Weeks    Status Achieved      PT SHORT TERM GOAL #2   Title Pt able to safely ambulate community distances without AD    Baseline -    Time 4    Period Weeks    Status Partially Met               PT Long Term Goals - 01/06/21 1244       PT LONG TERM GOAL #1   Title Pt will improve FOTO to >= 70    Time 4    Period Weeks    Status On-going    Target Date 02/03/21      PT LONG TERM GOAL #2   Title Pt will improve Rt knee  AROM to 3-120 deg to normalize gait and transfers.    Time 4    Period Weeks    Status On-going    Target Date 02/03/21      PT LONG TERM GOAL #3   Title Pt will improve Lt hip/knee strength to overall 4+ to improve function.    Time 4    Period Weeks    Status On-going    Target Date 02/03/21      PT LONG TERM GOAL #4   Title Pt will report less than 3-4/10 pain with standing/walking at least 30 minutes and  negotiating her 3 steps to enter her house.    Time 8    Period Weeks    Status Achieved                   Plan - 01/08/21 1013     Clinical Impression Statement PT instructed in positioning & breaking up exercises to limit time spent with knee in neutral midrange position which "stiffens" knee.  Pt verbalizes understanding.  Pt had slight improvement in range.    Personal Factors and Comorbidities Comorbidity 2;Past/Current Experience    Comorbidities DM, HTN     Examination-Activity Limitations Locomotion Level;Transfers    Examination-Participation Restrictions Cleaning;Other   working out at gym   Stability/Clinical Decision Making Stable/Uncomplicated    Rehab Potential Excellent    PT Frequency 2x / week    PT Duration 4 weeks    PT Treatment/Interventions ADLs/Self Care Home Management;Aquatic Therapy;Cryotherapy;Electrical Stimulation;Moist Heat;Balance training;Therapeutic exercise;Therapeutic activities;Functional mobility training;Stair training;Gait training;Patient/family education;Manual techniques;Passive range of motion;Scar mobilization;Taping;Vasopneumatic Device    PT Next Visit Plan aggressive flexion, quad strengthening; standing balance    PT Home Exercise Plan [id number]    Consulted and Agree with Plan of Care Patient             Patient will benefit from skilled therapeutic intervention in order to improve the following deficits and impairments:  Abnormal gait, Decreased range of motion, Pain, Impaired flexibility, Decreased balance, Decreased strength, Increased edema, Postural dysfunction  Visit Diagnosis: Stiffness of left knee, not elsewhere classified  Chronic pain of left knee  Localized edema  Muscle weakness (generalized)  Other abnormalities of gait and mobility     Problem List Patient Active Problem List   Diagnosis Date Noted   Class 1 obesity due to excess calories with serious comorbidity and body mass index (BMI) of 31.0 to 31.9 in adult 12/01/2020   Status post total left knee replacement 10/27/2020   Primary osteoarthritis of left knee    NAFLD (nonalcoholic fatty liver disease) 08/06/2020   Other fatigue 07/23/2020   SOBOE (shortness of breath on exertion) 07/23/2020   Diabetes mellitus ([place]) 07/23/2020   Hypertension associated with type 2 diabetes mellitus ([place]) 07/23/2020   Hyperlipidemia associated with type 2 diabetes mellitus ([place]) 07/23/2020   Absolute anemia 07/23/2020   Vitamin D  deficiency 07/23/2020   At risk for heart disease 07/23/2020   AKI (acute kidney injury) ([place]) 09/07/2019   Acute kidney injury ([place]) 09/06/2019   Bradycardia 09/06/2019   Hyperglycemia 09/06/2019   HLD (hyperlipidemia)    Hypertensive nephropathy 05/16/2019   Erosive gastritis 11/16/2017   Fatty liver 11/16/2017   Abdominal pain 12/11/2012   Early satiety 12/11/2012   Hiatal hernia    Hx of cholecystectomy 02/05/2011   Flu-like symptoms 02/05/2011   CONSTIPATION 08/27/2008   FATTY LIVER DISEASE 05/07/2008   Nausea with vomiting 04/23/2008   EPIGASTRIC PAIN 04/23/2008   RENAL CALCULUS 04/22/2008   Type 2 diabetes mellitus with stage 2 chronic kidney disease, with long-term current use of insulin ([place]) 05/19/2006   Type 2 diabetes mellitus with hyperlipidemia ([place]) 05/19/2006   HYPERTENSION, BENIGN SYSTEMIC 05/19/2006   GASTROESOPHAGEAL REFLUX, NO ESOPHAGITIS 05/19/2006   AMENORRHEA 05/19/2006    Jamey Reas, PT, DPT 01/08/2021, 11:16 AM  [facility] [address] [place], [place], [address] Phone: [phone]   Fax:  [phone]  Name: Leslie Davis MRN: [id number] Date of Birth: February 11, 1956

## 2021-01-12 ENCOUNTER — Telehealth: Payer: Self-pay | Admitting: Orthopaedic Surgery

## 2021-01-12 ENCOUNTER — Other Ambulatory Visit: Payer: Self-pay

## 2021-01-12 ENCOUNTER — Ambulatory Visit (INDEPENDENT_AMBULATORY_CARE_PROVIDER_SITE_OTHER): Payer: Medicare Other | Admitting: Family Medicine

## 2021-01-12 ENCOUNTER — Ambulatory Visit (INDEPENDENT_AMBULATORY_CARE_PROVIDER_SITE_OTHER): Payer: Medicare Other | Admitting: Physical Therapy

## 2021-01-12 ENCOUNTER — Encounter: Payer: Self-pay | Admitting: Physical Therapy

## 2021-01-12 DIAGNOSIS — M25562 Pain in left knee: Secondary | ICD-10-CM

## 2021-01-12 DIAGNOSIS — M25662 Stiffness of left knee, not elsewhere classified: Secondary | ICD-10-CM

## 2021-01-12 DIAGNOSIS — G8929 Other chronic pain: Secondary | ICD-10-CM | POA: Diagnosis not present

## 2021-01-12 DIAGNOSIS — R2689 Other abnormalities of gait and mobility: Secondary | ICD-10-CM | POA: Diagnosis not present

## 2021-01-12 DIAGNOSIS — R600 Localized edema: Secondary | ICD-10-CM | POA: Diagnosis not present

## 2021-01-12 DIAGNOSIS — M6281 Muscle weakness (generalized): Secondary | ICD-10-CM

## 2021-01-12 NOTE — Telephone Encounter (Signed)
Patient called needing Rx refilled Hydrocodone. The number to contact patient is [phone]

## 2021-01-12 NOTE — Therapy (Signed)
[facility] [address] [place], [place], [address] Phone: [phone]   Fax:  [phone]  Physical Therapy Treatment  Patient Details  Name: Leslie Davis MRN: [id number] Date of Birth: 25-Jun-1955 Referring Provider (PT): Frankey Shown MD   Encounter Date: 01/12/2021   PT End of Session - 01/12/21 1104     Visit Number 18    Number of Visits 24    Date for PT Re-Evaluation 02/03/21    Authorization Type UHC MCR    Progress Note Due on Visit 20    PT Start Time 1016    PT Stop Time 1055    PT Time Calculation (min) 39 min    Activity Tolerance Patient tolerated treatment well    Behavior During Therapy WFL for tasks assessed/performed             Past Medical History:  Diagnosis Date   DM (diabetes mellitus) ([place])    Fatty liver disease, nonalcoholic    Gastritis    Gastroparesis    GERD (gastroesophageal reflux disease)    Heartburn    Hiatal hernia    HLD (hyperlipidemia)    HTN (hypertension)    Joint pain    Renal calculus    Rheumatoid arthritis ([place])    Vitamin D deficiency     Past Surgical History:  Procedure Laterality Date   CHOLECYSTECTOMY     COLONOSCOPY     TOTAL KNEE ARTHROPLASTY Left 10/27/2020   Procedure: LEFT TOTAL KNEE ARTHROPLASTY;  Surgeon: [name], MD;  Location: [facility];  Service: Orthopedics;  Laterality: Left;   TUBAL LIGATION     UPPER GASTROINTESTINAL ENDOSCOPY      There were no vitals filed for this visit.   Subjective Assessment - 01/12/21 1019     Subjective reports she did her exercises over the weekend    Pertinent History DM, HTN    Patient Stated Goals Get back to my old self; no walker    Currently in Pain? No/denies                               [facility] Adult PT Treatment/Exercise - 01/12/21 1018       Knee/Hip Exercises: Aerobic   Recumbent Bike seat 6 partial to full revolutions x 8 min      Knee/Hip Exercises: Machines for Strengthening    Cybex Knee Extension 5 lbs LLE only (min A from RLE) 3x10    Cybex Knee Flexion 10# LLE with min A from RLE 3x10    Cybex Leg Press 50# 3x10 LLE only; 1 min flexion hold between sets      Knee/Hip Exercises: Standing   Step Down Left;3 sets;10 reps;Hand Hold: 2;Step Height: 4"   Rt heel tap to floor   Step Down Limitations difficulty with eccentric quad control    SLS with Vectors RLE on slider: forward/lateral/back with cues for knee flexion                       PT Short Term Goals - 11/27/20 0825       PT SHORT TERM GOAL #1   Title Pt will be I and compliant with HEP.    Time 4    Period Weeks    Status Achieved      PT SHORT TERM GOAL #2   Title Pt able to safely ambulate community distances without AD  Baseline -    Time 4    Period Weeks    Status Partially Met               PT Long Term Goals - 01/06/21 1244       PT LONG TERM GOAL #1   Title Pt will improve FOTO to >= 70    Time 4    Period Weeks    Status On-going    Target Date 02/03/21      PT LONG TERM GOAL #2   Title Pt will improve Rt knee  AROM to 3-120 deg to normalize gait and transfers.    Time 4    Period Weeks    Status On-going    Target Date 02/03/21      PT LONG TERM GOAL #3   Title Pt will improve Lt hip/knee strength to overall 4+ to improve function.    Time 4    Period Weeks    Status On-going    Target Date 02/03/21      PT LONG TERM GOAL #4   Title Pt will report less than 3-4/10 pain with standing/walking at least 30 minutes and negotiating her 3 steps to enter her house.    Time 8    Period Weeks    Status Achieved                   Plan - 01/12/21 1104     Clinical Impression Statement Pt tolerated session well today with continued focus on strengthening as well as ROM exercises to work on increasing flexion.   Will continue to benefit from PT to maximize function.    Personal Factors and Comorbidities Comorbidity 2;Past/Current Experience     Comorbidities DM, HTN    Examination-Activity Limitations Locomotion Level;Transfers    Examination-Participation Restrictions Cleaning;Other   working out at gym   Stability/Clinical Decision Making Stable/Uncomplicated    Rehab Potential Excellent    PT Frequency 2x / week    PT Duration 4 weeks    PT Treatment/Interventions ADLs/Self Care Home Management;Aquatic Therapy;Cryotherapy;Electrical Stimulation;Moist Heat;Balance training;Therapeutic exercise;Therapeutic activities;Functional mobility training;Stair training;Gait training;Patient/family education;Manual techniques;Passive range of motion;Scar mobilization;Taping;Vasopneumatic Device    PT Next Visit Plan aggressive flexion, quad strengthening; standing balance    PT Home Exercise Plan [id number]    Consulted and Agree with Plan of Care Patient             Patient will benefit from skilled therapeutic intervention in order to improve the following deficits and impairments:  Abnormal gait, Decreased range of motion, Pain, Impaired flexibility, Decreased balance, Decreased strength, Increased edema, Postural dysfunction  Visit Diagnosis: Stiffness of left knee, not elsewhere classified  Chronic pain of left knee  Localized edema  Muscle weakness (generalized)  Other abnormalities of gait and mobility     Problem List Patient Active Problem List   Diagnosis Date Noted   Class 1 obesity due to excess calories with serious comorbidity and body mass index (BMI) of 31.0 to 31.9 in adult 12/01/2020   Status post total left knee replacement 10/27/2020   Primary osteoarthritis of left knee    NAFLD (nonalcoholic fatty liver disease) 08/06/2020   Other fatigue 07/23/2020   SOBOE (shortness of breath on exertion) 07/23/2020   Diabetes mellitus ([place]) 07/23/2020   Hypertension associated with type 2 diabetes mellitus ([place]) 07/23/2020   Hyperlipidemia associated with type 2 diabetes mellitus ([place]) 07/23/2020   Absolute anemia  07/23/2020   Vitamin D deficiency  07/23/2020   At risk for heart disease 07/23/2020   AKI (acute kidney injury) ([place]) 09/07/2019   Acute kidney injury ([place]) 09/06/2019   Bradycardia 09/06/2019   Hyperglycemia 09/06/2019   HLD (hyperlipidemia)    Hypertensive nephropathy 05/16/2019   Erosive gastritis 11/16/2017   Fatty liver 11/16/2017   Abdominal pain 12/11/2012   Early satiety 12/11/2012   Hiatal hernia    Hx of cholecystectomy 02/05/2011   Flu-like symptoms 02/05/2011   CONSTIPATION 08/27/2008   FATTY LIVER DISEASE 05/07/2008   Nausea with vomiting 04/23/2008   EPIGASTRIC PAIN 04/23/2008   RENAL CALCULUS 04/22/2008   Type 2 diabetes mellitus with stage 2 chronic kidney disease, with long-term current use of insulin ([place]) 05/19/2006   Type 2 diabetes mellitus with hyperlipidemia ([place]) 05/19/2006   HYPERTENSION, BENIGN SYSTEMIC 05/19/2006   GASTROESOPHAGEAL REFLUX, NO ESOPHAGITIS 05/19/2006   AMENORRHEA 05/19/2006      Laureen Abrahams, PT, DPT 01/12/21 11:07 AM     [facility] Therapy [address] [place], [place], [address] Phone: [phone]   Fax:  [phone]  Name: Leslie Davis MRN: [id number] Date of Birth: 1955/08/13

## 2021-01-13 ENCOUNTER — Other Ambulatory Visit: Payer: Self-pay | Admitting: Physician Assistant

## 2021-01-13 MED ORDER — HYDROCODONE-ACETAMINOPHEN 5-325 MG PO TABS: 1.0000 | ORAL_TABLET | Freq: Every day | ORAL | 0 refills | Status: DC | PRN

## 2021-01-13 NOTE — Telephone Encounter (Signed)
Sent in.  Last narcotic rx we can send in

## 2021-01-13 NOTE — Telephone Encounter (Signed)
IC LM

## 2021-01-14 ENCOUNTER — Encounter: Payer: Self-pay | Admitting: Physical Therapy

## 2021-01-14 ENCOUNTER — Ambulatory Visit (INDEPENDENT_AMBULATORY_CARE_PROVIDER_SITE_OTHER): Payer: Medicare Other | Admitting: Physical Therapy

## 2021-01-14 ENCOUNTER — Other Ambulatory Visit: Payer: Self-pay

## 2021-01-14 DIAGNOSIS — M25662 Stiffness of left knee, not elsewhere classified: Secondary | ICD-10-CM | POA: Diagnosis not present

## 2021-01-14 DIAGNOSIS — R600 Localized edema: Secondary | ICD-10-CM

## 2021-01-14 DIAGNOSIS — R2689 Other abnormalities of gait and mobility: Secondary | ICD-10-CM | POA: Diagnosis not present

## 2021-01-14 DIAGNOSIS — M25562 Pain in left knee: Secondary | ICD-10-CM | POA: Diagnosis not present

## 2021-01-14 DIAGNOSIS — M6281 Muscle weakness (generalized): Secondary | ICD-10-CM

## 2021-01-14 DIAGNOSIS — G8929 Other chronic pain: Secondary | ICD-10-CM | POA: Diagnosis not present

## 2021-01-14 NOTE — Therapy (Signed)
[facility] [address] [place], [place], [address] Phone: [phone]   Fax:  [phone]  Physical Therapy Treatment  Patient Details  Name: Leslie Davis MRN: [id number] Date of Birth: December 10, 1955 Referring Provider (PT): Frankey Shown MD   Encounter Date: 01/14/2021   PT End of Session - 01/14/21 1058     Visit Number 19    Number of Visits 24    Date for PT Re-Evaluation 02/03/21    Authorization Type UHC MCR    Progress Note Due on Visit 20    PT Start Time 1010    PT Stop Time 1053    PT Time Calculation (min) 43 min    Activity Tolerance Patient tolerated treatment well    Behavior During Therapy WFL for tasks assessed/performed             Past Medical History:  Diagnosis Date   DM (diabetes mellitus) ([place])    Fatty liver disease, nonalcoholic    Gastritis    Gastroparesis    GERD (gastroesophageal reflux disease)    Heartburn    Hiatal hernia    HLD (hyperlipidemia)    HTN (hypertension)    Joint pain    Renal calculus    Rheumatoid arthritis ([place])    Vitamin D deficiency     Past Surgical History:  Procedure Laterality Date   CHOLECYSTECTOMY     COLONOSCOPY     TOTAL KNEE ARTHROPLASTY Left 10/27/2020   Procedure: LEFT TOTAL KNEE ARTHROPLASTY;  Surgeon: [name], MD;  Location: [facility];  Service: Orthopedics;  Laterality: Left;   TUBAL LIGATION     UPPER GASTROINTESTINAL ENDOSCOPY      There were no vitals filed for this visit.   Subjective Assessment - 01/14/21 1012     Subjective doing well; no c/o pain. arrived today without a cane    Pertinent History DM, HTN    Patient Stated Goals Get back to my old self; no walker    Currently in Pain? No/denies                [facility] PT Assessment - 01/14/21 1019       Assessment   Medical Diagnosis s/p Lt TKR    Referring Provider (PT) Frankey Shown MD    Onset Date/Surgical Date 10/27/20      AROM   Left Knee Extension -4   seated LAQ   Left  Knee Flexion 102   supine after working flexion     PROM   Left Knee Extension 0    Left Knee Flexion 105   supine                          [facility] Adult PT Treatment/Exercise - 01/14/21 1013       Knee/Hip Exercises: Stretches   Knee: Self-Stretch Limitations LLE 10 x 10 sec with RLE overpressure      Knee/Hip Exercises: Aerobic   Recumbent Bike seat 6 partial to full revolutions x 8 min      Knee/Hip Exercises: Standing   SLS LLE 5x15 sec with light UE support needed      Knee/Hip Exercises: Seated   Long Arc Quad Left;3 sets;10 reps;Weights    Long Arc Quad Weight 5 lbs.    Sit to Sand 10 reps;without UE support      Knee/Hip Exercises: Supine   Heel Slides AAROM;Left;5 reps  PT Short Term Goals - 01/14/21 1058       PT SHORT TERM GOAL #1   Title Pt will be I and compliant with HEP.    Time 4    Period Weeks    Status Achieved      PT SHORT TERM GOAL #2   Title Pt able to safely ambulate community distances without AD    Baseline -    Time 4    Period Weeks    Status Achieved               PT Long Term Goals - 01/06/21 1244       PT LONG TERM GOAL #1   Title Pt will improve FOTO to >= 70    Time 4    Period Weeks    Status On-going    Target Date 02/03/21      PT LONG TERM GOAL #2   Title Pt will improve Rt knee  AROM to 3-120 deg to normalize gait and transfers.    Time 4    Period Weeks    Status On-going    Target Date 02/03/21      PT LONG TERM GOAL #3   Title Pt will improve Lt hip/knee strength to overall 4+ to improve function.    Time 4    Period Weeks    Status On-going    Target Date 02/03/21      PT LONG TERM GOAL #4   Title Pt will report less than 3-4/10 pain with standing/walking at least 30 minutes and negotiating her 3 steps to enter her house.    Time 8    Period Weeks    Status Achieved                   Plan - 01/14/21 1059     Clinical Impression  Statement Pt demonstrating slow and steady progress with knee flexion ROM at this time.  Extension is only limited by weakess so continue to prioritize quad strengthening and knee flexion ROM for home and in clinic.  Will continue to benefit from PT to maximize function.    Personal Factors and Comorbidities Comorbidity 2;Past/Current Experience    Comorbidities DM, HTN    Examination-Activity Limitations Locomotion Level;Transfers    Examination-Participation Restrictions Cleaning;Other   working out at gym   Stability/Clinical Decision Making Stable/Uncomplicated    Rehab Potential Excellent    PT Frequency 2x / week    PT Duration 4 weeks    PT Treatment/Interventions ADLs/Self Care Home Management;Aquatic Therapy;Cryotherapy;Electrical Stimulation;Moist Heat;Balance training;Therapeutic exercise;Therapeutic activities;Functional mobility training;Stair training;Gait training;Patient/family education;Manual techniques;Passive range of motion;Scar mobilization;Taping;Vasopneumatic Device    PT Next Visit Plan aggressive flexion, quad strengthening; standing balance; needs visit 20 progress note    PT Home Exercise Plan [id number]    Consulted and Agree with Plan of Care Patient             Patient will benefit from skilled therapeutic intervention in order to improve the following deficits and impairments:  Abnormal gait, Decreased range of motion, Pain, Impaired flexibility, Decreased balance, Decreased strength, Increased edema, Postural dysfunction  Visit Diagnosis: Stiffness of left knee, not elsewhere classified  Chronic pain of left knee  Localized edema  Muscle weakness (generalized)  Other abnormalities of gait and mobility     Problem List Patient Active Problem List   Diagnosis Date Noted   Class 1 obesity due to excess calories with serious comorbidity and body mass  index (BMI) of 31.0 to 31.9 in adult 12/01/2020   Status post total left knee replacement 10/27/2020    Primary osteoarthritis of left knee    NAFLD (nonalcoholic fatty liver disease) 08/06/2020   Other fatigue 07/23/2020   SOBOE (shortness of breath on exertion) 07/23/2020   Diabetes mellitus ([place]) 07/23/2020   Hypertension associated with type 2 diabetes mellitus ([place]) 07/23/2020   Hyperlipidemia associated with type 2 diabetes mellitus ([place]) 07/23/2020   Absolute anemia 07/23/2020   Vitamin D deficiency 07/23/2020   At risk for heart disease 07/23/2020   AKI (acute kidney injury) ([place]) 09/07/2019   Acute kidney injury ([place]) 09/06/2019   Bradycardia 09/06/2019   Hyperglycemia 09/06/2019   HLD (hyperlipidemia)    Hypertensive nephropathy 05/16/2019   Erosive gastritis 11/16/2017   Fatty liver 11/16/2017   Abdominal pain 12/11/2012   Early satiety 12/11/2012   Hiatal hernia    Hx of cholecystectomy 02/05/2011   Flu-like symptoms 02/05/2011   CONSTIPATION 08/27/2008   FATTY LIVER DISEASE 05/07/2008   Nausea with vomiting 04/23/2008   EPIGASTRIC PAIN 04/23/2008   RENAL CALCULUS 04/22/2008   Type 2 diabetes mellitus with stage 2 chronic kidney disease, with long-term current use of insulin ([place]) 05/19/2006   Type 2 diabetes mellitus with hyperlipidemia ([place]) 05/19/2006   HYPERTENSION, BENIGN SYSTEMIC 05/19/2006   GASTROESOPHAGEAL REFLUX, NO ESOPHAGITIS 05/19/2006   AMENORRHEA 05/19/2006        Laureen Abrahams, PT, DPT 01/14/21 11:01 AM     [facility] [address] [place], [place], [address] Phone: [phone]   Fax:  [phone]  Name: Leslie Davis MRN: [id number] Date of Birth: [birth year]

## 2021-01-19 ENCOUNTER — Other Ambulatory Visit: Payer: Self-pay

## 2021-01-19 ENCOUNTER — Ambulatory Visit (INDEPENDENT_AMBULATORY_CARE_PROVIDER_SITE_OTHER): Payer: Medicare Other | Admitting: Physical Therapy

## 2021-01-19 ENCOUNTER — Encounter: Payer: Self-pay | Admitting: Physical Therapy

## 2021-01-19 DIAGNOSIS — M6281 Muscle weakness (generalized): Secondary | ICD-10-CM | POA: Diagnosis not present

## 2021-01-19 DIAGNOSIS — G8929 Other chronic pain: Secondary | ICD-10-CM | POA: Diagnosis not present

## 2021-01-19 DIAGNOSIS — R600 Localized edema: Secondary | ICD-10-CM | POA: Diagnosis not present

## 2021-01-19 DIAGNOSIS — M25562 Pain in left knee: Secondary | ICD-10-CM

## 2021-01-19 DIAGNOSIS — M25662 Stiffness of left knee, not elsewhere classified: Secondary | ICD-10-CM | POA: Diagnosis not present

## 2021-01-19 DIAGNOSIS — R2689 Other abnormalities of gait and mobility: Secondary | ICD-10-CM

## 2021-01-19 NOTE — Therapy (Signed)
[facility] [place], [place], [address] Phone: [phone]   Fax:  [phone]  Physical Therapy Treatment & Progress Note  Patient Details  Name: Leslie Davis MRN: [id number] Date of Birth: [birth year] Referring Provider (PT): Frankey Shown MD   Encounter Date: 01/19/2021  Progress Note Reporting Period 12/18/2020 to 01/19/2021  See note below for Objective Data and Assessment of Progress/Goals.       PT End of Session - 01/19/21 0841     Visit Number 20    Number of Visits 24    Date for PT Re-Evaluation 02/03/21    Authorization Type UHC MCR    PT Start Time 0845    PT Stop Time 0929    PT Time Calculation (min) 44 min    Activity Tolerance Patient tolerated treatment well    Behavior During Therapy WFL for tasks assessed/performed             Past Medical History:  Diagnosis Date   DM (diabetes mellitus) ([place])    Fatty liver disease, nonalcoholic    Gastritis    Gastroparesis    GERD (gastroesophageal reflux disease)    Heartburn    Hiatal hernia    HLD (hyperlipidemia)    HTN (hypertension)    Joint pain    Renal calculus    Rheumatoid arthritis ([place])    Vitamin D deficiency     Past Surgical History:  Procedure Laterality Date   CHOLECYSTECTOMY     COLONOSCOPY     TOTAL KNEE ARTHROPLASTY Left 10/27/2020   Procedure: LEFT TOTAL KNEE ARTHROPLASTY;  Surgeon: [name], MD;  Location: [facility];  Service: Orthopedics;  Laterality: Left;   TUBAL LIGATION     UPPER GASTROINTESTINAL ENDOSCOPY      There were no vitals filed for this visit.   Subjective Assessment - 01/19/21 0842     Subjective She went to gym and had some muscle soreness.  She rode cycle for 66min and knee ext & flex wt machines.    Pertinent History DM, HTN    Patient Stated Goals Get back to my old self; no walker    Currently in Pain? No/denies                [facility] PT Assessment - 01/19/21 0001       Assessment    Medical Diagnosis s/p Lt TKR    Referring Provider (PT) Frankey Shown MD    Onset Date/Surgical Date 10/27/20      AROM   Left Knee Extension -4   seated LAQ   Left Knee Flexion 108   seated heel slide                          [facility] Adult PT Treatment/Exercise - 01/19/21 0842       Knee/Hip Exercises: Stretches   Knee: Self-Stretch Limitations LLE 10 x 10 sec with RLE overpressure      Knee/Hip Exercises: Aerobic   Recumbent Bike seat 6  full revolutions x 8 min      Knee/Hip Exercises: Machines for Strengthening   Cybex Knee Extension 5 lbs LLE only (last 10* min A from RLE) 3x10reps    Cybex Knee Flexion 10# LLE with min A using arm on machine for last 10* of motion 3x10reps    Cybex Leg Press 50# seat position 1 for 3sets x 10 reps  LLE only; 1 min flexion hold between  sets      Knee/Hip Exercises: Standing   SLS SLS on LLE while tennis ball rolls RLE 10 reps slow & 15 reps fast for foreward/back, med/lat & circles.      Knee/Hip Exercises: Seated   Long Arc Quad Left;3 sets;10 reps;Weights    Long Arc Quad Weight 5 lbs.    Sit to Sand 10 reps;without UE support      Knee/Hip Exercises: Supine   Heel Slides Left;AROM;10 reps                       PT Short Term Goals - 01/14/21 1058       PT SHORT TERM GOAL #1   Title Pt will be I and compliant with HEP.    Time 4    Period Weeks    Status Achieved      PT SHORT TERM GOAL #2   Title Pt able to safely ambulate community distances without AD    Baseline -    Time 4    Period Weeks    Status Achieved               PT Long Term Goals - 01/06/21 1244       PT LONG TERM GOAL #1   Title Pt will improve FOTO to >= 70    Time 4    Period Weeks    Status On-going    Target Date 02/03/21      PT LONG TERM GOAL #2   Title Pt will improve Rt knee  AROM to 3-120 deg to normalize gait and transfers.    Time 4    Period Weeks    Status On-going    Target Date 02/03/21      PT  LONG TERM GOAL #3   Title Pt will improve Lt hip/knee strength to overall 4+ to improve function.    Time 4    Period Weeks    Status On-going    Target Date 02/03/21      PT LONG TERM GOAL #4   Title Pt will report less than 3-4/10 pain with standing/walking at least 30 minutes and negotiating her 3 steps to enter her house.    Time 8    Period Weeks    Status Achieved                   Plan - 01/19/21 6333     Clinical Impression Statement Pt. has attended 20 visits overall during course of treatment.  See objective data for updated information.  Pt. has made good gains to this point c mild defiicts noted in Rt knee AROM and movement coordination.  Pt. may continue to benefit from skilled PT services to continue progression towards reaching established goals and reduced difficulty due to presentation.    Personal Factors and Comorbidities Comorbidity 2;Past/Current Experience    Comorbidities DM, HTN    Examination-Activity Limitations Locomotion Level;Transfers    Examination-Participation Restrictions Cleaning;Other   working out at gym   Stability/Clinical Decision Making Stable/Uncomplicated    Rehab Potential Excellent    PT Frequency 2x / week    PT Duration 4 weeks    PT Treatment/Interventions ADLs/Self Care Home Management;Aquatic Therapy;Cryotherapy;Electrical Stimulation;Moist Heat;Balance training;Therapeutic exercise;Therapeutic activities;Functional mobility training;Stair training;Gait training;Patient/family education;Manual techniques;Passive range of motion;Scar mobilization;Taping;Vasopneumatic Device    PT Next Visit Plan aggressive flexion, quad strengthening; standing balance, update HEP    PT Home Exercise Plan [id number]  Consulted and Agree with Plan of Care Patient             Patient will benefit from skilled therapeutic intervention in order to improve the following deficits and impairments:  Abnormal gait, Decreased range of motion, Pain,  Impaired flexibility, Decreased balance, Decreased strength, Increased edema, Postural dysfunction  Visit Diagnosis: Stiffness of left knee, not elsewhere classified  Chronic pain of left knee  Localized edema  Other abnormalities of gait and mobility  Muscle weakness (generalized)     Problem List Patient Active Problem List   Diagnosis Date Noted   Class 1 obesity due to excess calories with serious comorbidity and body mass index (BMI) of 31.0 to 31.9 in adult 12/01/2020   Status post total left knee replacement 10/27/2020   Primary osteoarthritis of left knee    NAFLD (nonalcoholic fatty liver disease) 08/06/2020   Other fatigue 07/23/2020   SOBOE (shortness of breath on exertion) 07/23/2020   Diabetes mellitus ([place]) 07/23/2020   Hypertension associated with type 2 diabetes mellitus ([place]) 07/23/2020   Hyperlipidemia associated with type 2 diabetes mellitus ([place]) 07/23/2020   Absolute anemia 07/23/2020   Vitamin D deficiency 07/23/2020   At risk for heart disease 07/23/2020   AKI (acute kidney injury) ([place]) 09/07/2019   Acute kidney injury ([place]) 09/06/2019   Bradycardia 09/06/2019   Hyperglycemia 09/06/2019   HLD (hyperlipidemia)    Hypertensive nephropathy 05/16/2019   Erosive gastritis 11/16/2017   Fatty liver 11/16/2017   Abdominal pain 12/11/2012   Early satiety 12/11/2012   Hiatal hernia    Hx of cholecystectomy 02/05/2011   Flu-like symptoms 02/05/2011   CONSTIPATION 08/27/2008   FATTY LIVER DISEASE 05/07/2008   Nausea with vomiting 04/23/2008   EPIGASTRIC PAIN 04/23/2008   RENAL CALCULUS 04/22/2008   Type 2 diabetes mellitus with stage 2 chronic kidney disease, with long-term current use of insulin ([place]) 05/19/2006   Type 2 diabetes mellitus with hyperlipidemia ([place]) 05/19/2006   HYPERTENSION, BENIGN SYSTEMIC 05/19/2006   GASTROESOPHAGEAL REFLUX, NO ESOPHAGITIS 05/19/2006   AMENORRHEA 05/19/2006    Jamey Reas, PT, DPT 01/19/2021, 9:22 AM  [facility] [address] [place], [place], [address] Phone: [phone]   Fax:  [phone]  Name: Leslie Davis MRN: [id number] Date of Birth: 05-04-1955

## 2021-01-20 ENCOUNTER — Other Ambulatory Visit: Payer: Self-pay | Admitting: Internal Medicine

## 2021-01-20 ENCOUNTER — Ambulatory Visit: Payer: Medicare Other | Admitting: Orthopaedic Surgery

## 2021-01-20 DIAGNOSIS — G4700 Insomnia, unspecified: Secondary | ICD-10-CM

## 2021-01-21 ENCOUNTER — Ambulatory Visit (INDEPENDENT_AMBULATORY_CARE_PROVIDER_SITE_OTHER): Payer: Medicare Other | Admitting: Rehabilitative and Restorative Service Providers"

## 2021-01-21 ENCOUNTER — Encounter: Payer: Self-pay | Admitting: Rehabilitative and Restorative Service Providers"

## 2021-01-21 ENCOUNTER — Other Ambulatory Visit: Payer: Self-pay

## 2021-01-21 DIAGNOSIS — G8929 Other chronic pain: Secondary | ICD-10-CM | POA: Diagnosis not present

## 2021-01-21 DIAGNOSIS — M25562 Pain in left knee: Secondary | ICD-10-CM

## 2021-01-21 DIAGNOSIS — R2689 Other abnormalities of gait and mobility: Secondary | ICD-10-CM | POA: Diagnosis not present

## 2021-01-21 DIAGNOSIS — M25662 Stiffness of left knee, not elsewhere classified: Secondary | ICD-10-CM | POA: Diagnosis not present

## 2021-01-21 DIAGNOSIS — R600 Localized edema: Secondary | ICD-10-CM | POA: Diagnosis not present

## 2021-01-21 DIAGNOSIS — M6281 Muscle weakness (generalized): Secondary | ICD-10-CM | POA: Diagnosis not present

## 2021-01-21 NOTE — Therapy (Signed)
[facility] [address] [place], [place], [address] Phone: [phone]   Fax:  [phone]  Physical Therapy Treatment /MD visit measurement update  Patient Details  Name: Leslie Davis MRN: [id number] Date of Birth: 23-Oct-1955 Referring Provider (PT): Frankey Shown MD   Encounter Date: 01/21/2021   PT End of Session - 01/21/21 0931     Visit Number 21    Number of Visits 24    Date for PT Re-Evaluation 02/03/21    Authorization Type UHC MCR    PT Start Time 0925    PT Stop Time 1005    PT Time Calculation (min) 40 min    Activity Tolerance Patient tolerated treatment well    Behavior During Therapy WFL for tasks assessed/performed             Past Medical History:  Diagnosis Date   DM (diabetes mellitus) ([place])    Fatty liver disease, nonalcoholic    Gastritis    Gastroparesis    GERD (gastroesophageal reflux disease)    Heartburn    Hiatal hernia    HLD (hyperlipidemia)    HTN (hypertension)    Joint pain    Renal calculus    Rheumatoid arthritis ([place])    Vitamin D deficiency     Past Surgical History:  Procedure Laterality Date   CHOLECYSTECTOMY     COLONOSCOPY     TOTAL KNEE ARTHROPLASTY Left 10/27/2020   Procedure: LEFT TOTAL KNEE ARTHROPLASTY;  Surgeon: [name], MD;  Location: [place];  Service: Orthopedics;  Laterality: Left;   TUBAL LIGATION     UPPER GASTROINTESTINAL ENDOSCOPY      There were no vitals filed for this visit.   Subjective Assessment - 01/21/21 0928     Subjective Pt. indicated some tight soreness mild today upon arrival.  Reported pain at worst in last week around 5/10.  Global rating of change +6 a great deal better reported.    Pertinent History DM, HTN    Patient Stated Goals Get back to my old self; no walker    Currently in Pain? Yes    Pain Score 5    at worst in last week   Pain Location Knee    Pain Orientation Left    Pain Descriptors / Indicators Tightness    Pain Type  Surgical pain    Pain Onset More than a month ago    Pain Frequency Intermittent    Aggravating Factors  bending stiffness    Pain Relieving Factors nothing specific                [facility] PT Assessment - 01/21/21 0001       Assessment   Medical Diagnosis s/p Lt TKR    Referring Provider (PT) Frankey Shown MD    Onset Date/Surgical Date 10/27/20    Hand Dominance Right      Observation/Other Assessments   Focus on Therapeutic Outcomes (FOTO)  updated 67%      AROM   Left Knee Extension -6   in LAQ   Left Knee Flexion 108   in supine heel slide     Strength   Right/Left Knee Left;Right    Right Knee Extension 5/5   22.3, 25.4 lbs   Left Knee Extension 5/5   21.5, 21.5 lbs     Ambulation/Gait   Ambulation/Gait Assistance 7: Independent    Gait Pattern Antalgic;Decreased stance time - left  [place] Adult PT Treatment/Exercise - 01/21/21 0001       Neuro Re-ed    Neuro Re-ed Details  10 ft fwd/back ambulation c cues for WB transitioning x 6 each way, single leg stance c cone touching (anterior, anterior/medial, anterior/lateral) x 6 each bilateral, tandem ambulation fwd/back 10 ft x 6 each way in bars      Knee/Hip Exercises: Machines for Strengthening   Cybex Leg Press 50# seat position 1 for 3sets x 10 reps  LLE only; 1 min flexion hold between sets      Knee/Hip Exercises: Seated   Other Seated Knee/Hip Exercises isometric alternating flexion/extension 5 sec hold Lt knee x 10 each way in 60 deg flexion, 30 deg flexion      Knee/Hip Exercises: Supine   Heel Slides Left;AROM;10 reps      Manual Therapy   Manual therapy comments seated Lt knee flexion c distraction/IR mobilization c movement c contralateral leg movement opposite.                       PT Short Term Goals - 01/14/21 1058       PT SHORT TERM GOAL #1   Title Pt will be I and compliant with HEP.    Time 4    Period Weeks    Status Achieved       PT SHORT TERM GOAL #2   Title Pt able to safely ambulate community distances without AD    Baseline -    Time 4    Period Weeks    Status Achieved               PT Long Term Goals - 01/21/21 4081       PT LONG TERM GOAL #1   Title Pt will improve FOTO to >= 70    Time 4    Period Weeks    Status On-going    Target Date 02/03/21      PT LONG TERM GOAL #2   Title Pt will improve Rt knee  AROM to 3-120 deg to normalize gait and transfers.    Time 4    Period Weeks    Status On-going    Target Date 02/03/21      PT LONG TERM GOAL #3   Title Pt will improve Lt hip/knee strength to overall 4+ to improve function.    Time 4    Period Weeks    Status Achieved    Target Date 02/03/21      PT LONG TERM GOAL #4   Title Pt will report less than 3-4/10 pain with standing/walking at least 30 minutes and negotiating her 3 steps to enter her house.    Time 8    Period Weeks    Status On-going    Target Date 02/03/21                   Plan - 01/21/21 4481     Clinical Impression Statement Current presentation showed Pt. still showing antalgic gait deviations in stance on Lt leg, dynamometry strength testing similar Lt and Rt for knee extension (Rt leg has own history of trouble), and mild end range mobility restrictions still present.    Personal Factors and Comorbidities Comorbidity 2;Past/Current Experience    Comorbidities DM, HTN    Examination-Activity Limitations Locomotion Level;Transfers    Examination-Participation Restrictions Cleaning;Other   working out at gym   Stability/Clinical Decision Making Stable/Uncomplicated  Rehab Potential Excellent    PT Frequency 2x / week    PT Duration 4 weeks    PT Treatment/Interventions ADLs/Self Care Home Management;Aquatic Therapy;Cryotherapy;Electrical Stimulation;Moist Heat;Balance training;Therapeutic exercise;Therapeutic activities;Functional mobility training;Stair training;Gait training;Patient/family  education;Manual techniques;Passive range of motion;Scar mobilization;Taping;Vasopneumatic Device    PT Next Visit Plan aggressive flexion, quad strengthening; standing balance, update HEP    PT Home Exercise Plan [email or web address]    Consulted and Agree with Plan of Care Patient             Patient will benefit from skilled therapeutic intervention in order to improve the following deficits and impairments:  Abnormal gait, Decreased range of motion, Pain, Impaired flexibility, Decreased balance, Decreased strength, Increased edema, Postural dysfunction  Visit Diagnosis: Stiffness of left knee, not elsewhere classified  Chronic pain of left knee  Localized edema  Muscle weakness (generalized)  Other abnormalities of gait and mobility     Problem List Patient Active Problem List   Diagnosis Date Noted   Class 1 obesity due to excess calories with serious comorbidity and body mass index (BMI) of 31.0 to 31.9 in adult 12/01/2020   Status post total left knee replacement 10/27/2020   Primary osteoarthritis of left knee    NAFLD (nonalcoholic fatty liver disease) 08/06/2020   Other fatigue 07/23/2020   SOBOE (shortness of breath on exertion) 07/23/2020   Diabetes mellitus ([place]) 07/23/2020   Hypertension associated with type 2 diabetes mellitus ([place]) 07/23/2020   Hyperlipidemia associated with type 2 diabetes mellitus ([place]) 07/23/2020   Absolute anemia 07/23/2020   Vitamin D deficiency 07/23/2020   At risk for heart disease 07/23/2020   AKI (acute kidney injury) ([place]) 09/07/2019   Acute kidney injury ([place]) 09/06/2019   Bradycardia 09/06/2019   Hyperglycemia 09/06/2019   HLD (hyperlipidemia)    Hypertensive nephropathy 05/16/2019   Erosive gastritis 11/16/2017   Fatty liver 11/16/2017   Abdominal pain 12/11/2012   Early satiety 12/11/2012   Hiatal hernia    Hx of cholecystectomy 02/05/2011   Flu-like symptoms 02/05/2011   CONSTIPATION 08/27/2008   FATTY LIVER DISEASE  05/07/2008   Nausea with vomiting 04/23/2008   EPIGASTRIC PAIN 04/23/2008   RENAL CALCULUS 04/22/2008   Type 2 diabetes mellitus with stage 2 chronic kidney disease, with long-term current use of insulin ([place]) 05/19/2006   Type 2 diabetes mellitus with hyperlipidemia ([place]) 05/19/2006   HYPERTENSION, BENIGN SYSTEMIC 05/19/2006   GASTROESOPHAGEAL REFLUX, NO ESOPHAGITIS 05/19/2006   AMENORRHEA 05/19/2006    Scot Jun, PT, DPT, OCS, ATC 01/21/21  10:01 AM    [facility] [address] [place], [place], [address] Phone: [phone]   Fax:  [phone]  Name: Leslie Davis MRN: [id number] Date of Birth: 1956-01-30

## 2021-01-22 ENCOUNTER — Ambulatory Visit (INDEPENDENT_AMBULATORY_CARE_PROVIDER_SITE_OTHER): Payer: Medicare Other | Admitting: Family Medicine

## 2021-01-22 ENCOUNTER — Ambulatory Visit (INDEPENDENT_AMBULATORY_CARE_PROVIDER_SITE_OTHER): Payer: Medicare Other | Admitting: Physician Assistant

## 2021-01-22 ENCOUNTER — Encounter (INDEPENDENT_AMBULATORY_CARE_PROVIDER_SITE_OTHER): Payer: Self-pay | Admitting: Family Medicine

## 2021-01-22 ENCOUNTER — Telehealth: Payer: Self-pay | Admitting: Orthopaedic Surgery

## 2021-01-22 ENCOUNTER — Telehealth: Payer: Self-pay

## 2021-01-22 ENCOUNTER — Other Ambulatory Visit: Payer: Self-pay | Admitting: Physician Assistant

## 2021-01-22 ENCOUNTER — Encounter: Payer: Self-pay | Admitting: Orthopaedic Surgery

## 2021-01-22 VITALS — BP 154/77 | HR 87 | Temp 98.2°F | Ht 64.0 in | Wt 180.0 lb

## 2021-01-22 DIAGNOSIS — Z6835 Body mass index (BMI) 35.0-35.9, adult: Secondary | ICD-10-CM

## 2021-01-22 DIAGNOSIS — K5900 Constipation, unspecified: Secondary | ICD-10-CM | POA: Diagnosis not present

## 2021-01-22 DIAGNOSIS — Z96652 Presence of left artificial knee joint: Secondary | ICD-10-CM

## 2021-01-22 DIAGNOSIS — E1159 Type 2 diabetes mellitus with other circulatory complications: Secondary | ICD-10-CM | POA: Diagnosis not present

## 2021-01-22 DIAGNOSIS — E1169 Type 2 diabetes mellitus with other specified complication: Secondary | ICD-10-CM

## 2021-01-22 DIAGNOSIS — Z794 Long term (current) use of insulin: Secondary | ICD-10-CM | POA: Diagnosis not present

## 2021-01-22 DIAGNOSIS — E559 Vitamin D deficiency, unspecified: Secondary | ICD-10-CM | POA: Diagnosis not present

## 2021-01-22 DIAGNOSIS — I152 Hypertension secondary to endocrine disorders: Secondary | ICD-10-CM | POA: Diagnosis not present

## 2021-01-22 MED ORDER — HYDROCODONE-ACETAMINOPHEN 5-325 MG PO TABS: 1.0000 | ORAL_TABLET | Freq: Every day | ORAL | 0 refills | Status: DC | PRN

## 2021-01-22 MED ORDER — VITAMIN D3 1.25 MG (50000 UT) PO CAPS: ORAL_CAPSULE | ORAL | 0 refills | Status: DC

## 2021-01-22 MED ORDER — DOCUSATE SODIUM 100 MG PO CAPS: 100.0000 mg | ORAL_CAPSULE | Freq: Every day | ORAL | 2 refills | Status: DC | PRN

## 2021-01-22 NOTE — Progress Notes (Signed)
Post-Op Visit Note   Patient: Leslie Davis           Date of Birth: 04/07/1955           MRN: [id number] Visit Date: 01/22/2021 PCP: [name], MD   Assessment & Plan:  Chief Complaint:  Chief Complaint  Patient presents with   Left Knee - Pain   Visit Diagnoses:  1. Hx of total knee replacement, left     Plan: Patient is a pleasant 65 year old female who comes in today nearly 3 months status post left total knee replacement 10/27/2020.  She has been doing well.  She is still in physical therapy regaining range of motion.  She is taking Norco as needed for pain.  Examination of her left knee reveals range of motion from approximately 5 to 110 degrees.  She is stable valgus varus stress.  She is neurovascular intact distally.  At this point, she will continue with her physical therapy.  She will continue with her home exercise program as well.  Dental prophylaxis reinforced.  She is asked for 1 more refill of Norco which I have agreed to.  Follow-up with [place] in 3 months time for repeat evaluation and 2 view x-rays of the left knee.  Call with concerns or questions in the meantime.  Follow-Up Instructions: Return in about 3 months (around 04/24/2021).   Orders:  No orders of the defined types were placed in this encounter.  Meds ordered this encounter  Medications   HYDROcodone-acetaminophen (NORCO) 5-325 MG tablet    Sig: Take 1 tablet by mouth daily as needed.    Dispense:  20 tablet    Refill:  0     Imaging: No new imaging   PMFS History: Patient Active Problem List   Diagnosis Date Noted   Class 1 obesity due to excess calories with serious comorbidity and body mass index (BMI) of 31.0 to 31.9 in adult 12/01/2020   Status post total left knee replacement 10/27/2020   Primary osteoarthritis of left knee    NAFLD (nonalcoholic fatty liver disease) 08/06/2020   Other fatigue 07/23/2020   SOBOE (shortness of breath on exertion) 07/23/2020   Diabetes mellitus  ([place]) 07/23/2020   Hypertension associated with type 2 diabetes mellitus ([place]) 07/23/2020   Hyperlipidemia associated with type 2 diabetes mellitus ([place]) 07/23/2020   Absolute anemia 07/23/2020   Vitamin D deficiency 07/23/2020   At risk for heart disease 07/23/2020   AKI (acute kidney injury) ([place]) 09/07/2019   Acute kidney injury ([place]) 09/06/2019   Bradycardia 09/06/2019   Hyperglycemia 09/06/2019   HLD (hyperlipidemia)    Hypertensive nephropathy 05/16/2019   Erosive gastritis 11/16/2017   Fatty liver 11/16/2017   Abdominal pain 12/11/2012   Early satiety 12/11/2012   Hiatal hernia    Hx of cholecystectomy 02/05/2011   Flu-like symptoms 02/05/2011   CONSTIPATION 08/27/2008   FATTY LIVER DISEASE 05/07/2008   Nausea with vomiting 04/23/2008   EPIGASTRIC PAIN 04/23/2008   RENAL CALCULUS 04/22/2008   Type 2 diabetes mellitus with stage 2 chronic kidney disease, with long-term current use of insulin ([place]) 05/19/2006   Type 2 diabetes mellitus with hyperlipidemia ([place]) 05/19/2006   HYPERTENSION, BENIGN SYSTEMIC 05/19/2006   GASTROESOPHAGEAL REFLUX, NO ESOPHAGITIS 05/19/2006   AMENORRHEA 05/19/2006   Past Medical History:  Diagnosis Date   DM (diabetes mellitus) ([place])    Fatty liver disease, nonalcoholic    Gastritis    Gastroparesis    GERD (gastroesophageal reflux disease)  Heartburn    Hiatal hernia    HLD (hyperlipidemia)    HTN (hypertension)    Joint pain    Renal calculus    Rheumatoid arthritis ([place])    Vitamin D deficiency     Family History  Problem Relation Age of Onset   Diabetes Mother    Thyroid disease Mother    Kidney disease Mother    Diabetes Maternal Grandmother    Stomach cancer Maternal Aunt        X 2 aunts   Hypertension Father    Hyperlipidemia Father    Sudden death Father    Stroke Father    Colon cancer Neg Hx    Esophageal cancer Neg Hx    Rectal cancer Neg Hx     Past Surgical History:  Procedure Laterality Date    CHOLECYSTECTOMY     COLONOSCOPY     TOTAL KNEE ARTHROPLASTY Left 10/27/2020   Procedure: LEFT TOTAL KNEE ARTHROPLASTY;  Surgeon: [name], MD;  Location: [facility];  Service: Orthopedics;  Laterality: Left;   TUBAL LIGATION     UPPER GASTROINTESTINAL ENDOSCOPY     Social History   Occupational History   Occupation: Housekeeping    Comment: Estate manager/land agent  Tobacco Use   Smoking status: Former    Packs/day: 0.25    Years: 1.00    Pack years: 0.25    Types: Cigarettes    Quit date: 09/10/1974    Years since quitting: 46.4   Smokeless tobacco: Never   Tobacco comments:    she no longer smokes.   Vaping Use   Vaping Use: Never used  Substance and Sexual Activity   Alcohol use: No   Drug use: No   Sexual activity: Not Currently

## 2021-01-22 NOTE — Telephone Encounter (Signed)
Sent in

## 2021-01-22 NOTE — Telephone Encounter (Signed)
[place] called asking to have a refill of the colace 100 mg rx sent in for the pt.  Pharmacy# [phone]

## 2021-01-22 NOTE — Chronic Care Management (AMB) (Signed)
Chronic Care Management Pharmacy Assistant   Name: Leslie Davis  MRN: [id number] DOB: [birth year]   Reason for Encounter: Medication Review/ Medication Coordination  Recent office visits:  None  Recent consult visits:  01-21-2021 Barbaraann Rondo, PT (Physical therapy).Therapy on left knee.  01-19-2021 Fraser Din, PT (Physical therapy).Therapy on left knee.  01-14-2021 Clarita Crane, PT (Physical therapy).Therapy on left knee.  01-12-2021 Clarita Crane, PT (Physical therapy).Therapy on left knee.  01-08-2021 Fraser Din, PT (Physical therapy).Therapy on left knee.  01-06-2021 Clarita Crane, PT (Physical therapy).Therapy on left knee.  12-31-2020 Clarita Crane, PT (Physical therapy).Therapy on left knee.  12-29-2020 Fraser Din, PT (Physical therapy).Therapy on left knee.  12-24-2020 Clarita Crane, PT (Physical therapy).Therapy on left knee.  Hospital visits:  None in previous 6 months  Medications: Outpatient Encounter Medications as of 01/22/2021  Medication Sig   HYDROcodone-acetaminophen (NORCO) 5-325 MG tablet Take 1-2 tablets by mouth daily as needed.   amLODipine (NORVASC) 10 MG tablet TAKE ONE TABLET BY MOUTH ONCE DAILY   aspirin EC 81 MG tablet Take 1 tablet (81 mg total) by mouth 2 (two) times daily. To be taken after surgery   Azilsartan Medoxomil (EDARBI) 80 MG TABS TAKE 1 TABLET EACH DAY.   Blood Glucose Monitoring Suppl (ONETOUCH VERIO) w/Device KIT Use as directed to check blood sugars 2 times per day dx: e11.65   cetirizine (ZYRTEC) 10 MG tablet Take 1 tablet (10 mg total) by mouth daily.   Cholecalciferol (VITAMIN D3) 1.25 MG (50000 UT) CAPS Take 1 capsule by mouth every 2 wks   dapagliflozin propanediol (FARXIGA) 10 MG TABS tablet Take 1 tablet (10 mg total) by mouth daily before breakfast.   docusate sodium (COLACE) 100 MG capsule Take 1 capsule (100 mg total) by mouth daily as needed.    ferrous gluconate (FERGON) 324 MG tablet TAKE 1 TABLET BY MOUTH TWICE DAILY WITH A MEAL. (Patient taking differently: Take 324 mg by mouth 2 (two) times daily with a meal.)   glucose blood (ONETOUCH VERIO) test strip Use as instructed to check blood sugars 2 times per day dx:e11.65   HYDROcodone-acetaminophen (NORCO) 5-325 MG tablet Take 1 tablet by mouth 2 (two) times daily between meals as needed.   ibuprofen (ADVIL) 800 MG tablet Take 1 tablet (800 mg total) by mouth every 8 (eight) hours as needed.   insulin glargine, 1 Unit Dial, (TOUJEO SOLOSTAR) 300 UNIT/ML Solostar Pen Inject 35 units subcutaneous at bedtime   insulin lispro (HUMALOG KWIKPEN) 100 UNIT/ML KwikPen Sliding scale (Patient taking differently: Inject 4-8 Units into the skin daily as needed (CBG over 150). Sliding scale)   linaclotide ([name]) 145 MCG CAPS capsule Take 1 capsule (145 mcg total) by mouth daily.   OneTouch Delica Lancets 33G MISC Use as directed to check blood sugars 2 times per day dx:e11.65   pantoprazole (PROTONIX) 40 MG tablet Take one tablet by mouth 3 days a week   rosuvastatin (CRESTOR) 20 MG tablet Take 1 tablet (20 mg total) by mouth daily.   Semaglutide, 1 MG/DOSE, (OZEMPIC, 1 MG/DOSE,) 4 MG/3ML SOPN Inject 1 mg into the skin once a week. (Patient taking differently: Inject 1 mg into the skin once a week. Tuesday)   temazepam (RESTORIL) 30 MG capsule TAKE ONE CAPSULE BY MOUTH EVERYDAY AT BEDTIME AS NEEDED   TRUEPLUS PEN NEEDLES 31G X 6 MM MISC USE WITH pen TO INJECT insulin DAILY   No facility-administered encounter  medications on file as of 01/22/2021.   Reviewed chart for medication changes ahead of medication coordination call.  No OVs, Consults, or hospital visits since last care coordination call/Pharmacist visit. (If appropriate, list visit date, provider name)  No medication changes indicated OR if recent visit, treatment plan here.  BP Readings from Last 3 Encounters:  12/23/20 (!) 140/50   12/22/20 140/60  12/01/20 138/60    Lab Results  Component Value Date   HGBA1C 6.3 (H) 10/22/2020     Patient obtains medications through [facility]  Other  Last adherence delivery included: Test strips Vitamin D3 Colace Farxiga Aspirin Hydrocodone  Patient declined (meds) last month: None  Patient is due for next adherence delivery on: 02-03-2021  Called patient and reviewed medications and coordinated delivery.  This delivery to include: Docusate sodium 100 mg as needed Temazepam 30 mg as needed Fergon 324 mg twice daily Zyrtec 10 mg daily  Acute form submitted for Hydrocodone and Vit D3  Patient declined the following medications: Colace patient has plenty until next delivery in December  Patient needs refills for: Colace temazepam  Confirmed delivery date of 02-03-2021 advised patient that pharmacy will contact them the morning of delivery.  NOTES: Hydrocodone and Vitamin D3 was sent in on 01-22-2021 and is needed.  Care Gaps: AWV 01-14-2022 PNA Vac overdue Shingrix overdue Covid booster overdue last completed 08-31-2019 Yearly foot exam overdue last completed 11-15-2019  Star Rating Drugs: Rosuvastatin 20 MG- Last filled 12-01-2020 [facility] Ozempic 1 MG- Last filled 01-13-2021 [facility] Azilsartan 80 MG- Last filled 12-01-2020 [facility] Farxiga 10 MG- Last filled 12-29-2020 [facility]  [place] Clinical Pharmacist Assistant [phone]

## 2021-01-22 NOTE — Telephone Encounter (Signed)
Looked at dispense history.

## 2021-01-26 NOTE — Progress Notes (Signed)
Chief Complaint:   OBESITY Leslie Davis is here to discuss her progress with her obesity treatment plan along with follow-up of her obesity related diagnoses. Leslie Davis is on the Category 1 Plan with protein equivalents and states she is following her eating plan approximately 90% of the time. Leslie Davis states she is doing leg exercises and upper body for 10 minutes 4 times per week.  Today's visit was #: 10 Starting weight: 208 lbs Starting date: 07/23/2020 Today's weight: 180 lbs Today's date: 01/22/2021 Total lbs lost to date: 28 Total lbs lost since last in-office visit: 4  Interim History: Leslie Davis is here for a follow up office visit. We reviewed her meal plan and questions were answered. Patient's food recall appears to be accurate and consistent with what is on plan when she is following it. When eating on plan, her hunger and cravings are well controlled. Leslie Davis has been going to [employer] for 2 weeks now 2 days per week. She does strength training, but it is difficult to do cardio with her left knee pain. She still does physical therapy twice weekly for that.   Subjective:   1. Type 2 diabetes mellitus with other specified complication, with long-term current use of insulin (HCC) Leslie Davis's fasting blood sugars range in the 80's, and her highest is 93 at 30 units qhs. No SSI, and she denies hunger or cravings.  2. Hypertension associated with type 2 diabetes mellitus (Leslie Davis) Blimi's blood pressure was 155/66 at home this morning. It usually is around 150's/60's-70's. She is still suboptimals but has improved.  3. Acute on chronic constipation, unspecified constipation type Leslie Davis denies issues or concerns. She occasionally takes [name] 2 days per week or less. Her symptoms are under good control.  4. Vitamin D deficiency Leslie Davis is currently taking prescription vitamin D 50,000 IU every 14 days. She denies nausea, vomiting or muscle weakness.  Assessment/Plan:    Orders Placed This Encounter  Procedures   Hemoglobin A1c   VITAMIN D 25 Hydroxy (Vit-D Deficiency, Fractures)    Medications Discontinued During This Encounter  Medication Reason   docusate sodium (COLACE) 100 MG capsule    Cholecalciferol (VITAMIN D3) 1.25 MG (50000 UT) CAPS Reorder   insulin glargine, 1 Unit Dial, (TOUJEO SOLOSTAR) 300 UNIT/ML Solostar Pen Error     Meds ordered this encounter  Medications   Cholecalciferol (VITAMIN D3) 1.25 MG (50000 UT) CAPS    Sig: Take 1 capsule by mouth every 2 wks    Dispense:  2 capsule    Refill:  0     1. Type 2 diabetes mellitus with other specified complication, with long-term current use of insulin ([place]) Natalya agreed to decrease Toujeo to 25 units qhs, continue Farxiga, and continue Ozempic 1 mg weekly. We will consider increasing Ozempic in the future, which patient declined today. We will recheck her A1c at her next office visit. Good blood sugar control is important to decrease the likelihood of diabetic complications such as nephropathy, neuropathy, limb loss, blindness, coronary artery disease, and death. Intensive lifestyle modification including diet, exercise and weight loss are the first line of treatment for diabetes.   - Hemoglobin A1c  2. Hypertension associated with type 2 diabetes mellitus ([place]) Leslie Davis will continue to avoid salt, increase cardio, and continue home blood pressure monitoring. We will watch for signs of hypotension as she continues her lifestyle modifications.  3. Acute on chronic constipation, unspecified constipation type Leslie Davis declines a refill for [name] today. She  will increase cardio, and increase her water intake. She was informed that a decrease in bowel movement frequency is normal while losing weight, but stools should not be hard or painful. Orders and follow up as documented in patient record.   Counseling Getting to Good Bowel Health: Your goal is to have one soft bowel movement each  day. Drink at least 8 glasses of water each day. Eat plenty of fiber (goal is over 25 grams each day). It is best to get most of your fiber from dietary sources which includes leafy green vegetables, fresh fruit, and whole grains. You may need to add fiber with the help of OTC fiber supplements. These include Metamucil, Citrucel, and Flaxseed. If you are still having trouble, try adding Miralax or Magnesium Citrate. If all of these changes do not work, Cabin crew.  4. Vitamin D deficiency Low Vitamin D level contributes to fatigue and are associated with obesity, breast, and colon cancer. We will refill prescription Vitamin D 50,000 IU every 14 days for 1 month, and we will recheck labs at her next office visit. Leslie Davis will follow-up for routine testing of Vitamin D, at least 2-3 times per year to avoid over-replacement.  - Cholecalciferol (VITAMIN D3) 1.25 MG (50000 UT) CAPS; Take 1 capsule by mouth every 2 wks  Dispense: 2 capsule; Refill: 0 - VITAMIN D 25 Hydroxy (Vit-D Deficiency, Fractures)  5. Obesity with current BMI of 31.0 Leslie Davis is currently in the action stage of change. As such, her goal is to continue with weight loss efforts. She has agreed to the Category 1 Plan with protein equivalents.   Leslie Davis is to increase her water intake to 4-5 bottles of water per day and increase her cardio.  We will recheck fasting labs at her next office visit.  Exercise goals: For substantial health benefits, adults should do at least 150 minutes (2 hours and 30 minutes) a week of moderate-intensity, or 75 minutes (1 hour and 15 minutes) a week of vigorous-intensity aerobic physical activity, or an equivalent combination of moderate- and vigorous-intensity aerobic activity. Aerobic activity should be performed in episodes of at least 10 minutes, and preferably, it should be spread throughout the week.  Behavioral modification strategies: increasing lean protein intake, increasing water  intake, and planning for success.  Leslie Davis has agreed to follow-up with our clinic in 2 to 4 weeks. She was informed of the importance of frequent follow-up visits to maximize her success with intensive lifestyle modifications for her multiple health conditions.   Objective:   Blood pressure (!) 154/77, pulse 87, temperature 98.2 F (36.8 C), height 5\' 4"  (1.626 m), weight 180 lb (81.6 kg), SpO2 95 %. Body mass index is 30.9 kg/m.  General: Cooperative, alert, well developed, in no acute distress. HEENT: Conjunctivae and lids unremarkable. Cardiovascular: Regular rhythm.  Lungs: Normal work of breathing. Neurologic: No focal deficits.   Lab Results  Component Value Date   CREATININE 1.19 12/23/2020   BUN 19 12/23/2020   NA 139 12/23/2020   K 3.6 12/23/2020   CL 106 12/23/2020   CO2 24 12/23/2020   Lab Results  Component Value Date   ALT 28 11/20/2020   AST 23 11/20/2020   ALKPHOS 289 (H) 11/20/2020   BILITOT <0.2 11/20/2020   Lab Results  Component Value Date   HGBA1C 6.3 (H) 10/22/2020   HGBA1C 6.8 (H) 08/25/2020   HGBA1C 8.7 (H) 05/28/2020   HGBA1C 8.8 (H) 02/21/2020   HGBA1C 10.5 (H) 11/15/2019  No results found for: INSULIN Lab Results  Component Value Date   TSH 1.790 07/23/2020   Lab Results  Component Value Date   CHOL 156 11/20/2020   HDL 61 11/20/2020   LDLCALC 78 11/20/2020   TRIG 89 11/20/2020   CHOLHDL 2.6 11/20/2020   Lab Results  Component Value Date   VD25OH 102.0 (H) 11/20/2020   VD25OH 95.2 10/22/2020   VD25OH 112.0 (H) 07/23/2020   Lab Results  Component Value Date   WBC 5.1 12/23/2020   HGB 11.2 (L) 12/23/2020   HCT 34.9 (L) 12/23/2020   MCV 85.1 12/23/2020   PLT 256.0 12/23/2020   Lab Results  Component Value Date   IRON 68 11/16/2017   TIBC 355 11/16/2017   FERRITIN 45.2 11/16/2017    Obesity Behavioral Intervention:   Approximately 15 minutes were spent on the discussion below.  ASK: We discussed the diagnosis of  obesity with [name] today and Leslie Davis agreed to give [place] permission to discuss obesity behavioral modification therapy today.  ASSESS: Onita has the diagnosis of obesity and her BMI today is 31.0. Roxsana is in the action stage of change.   ADVISE: Angeliah was educated on the multiple health risks of obesity as well as the benefit of weight loss to improve her health. She was advised of the need for long term treatment and the importance of lifestyle modifications to improve her current health and to decrease her risk of future health problems.  AGREE: Multiple dietary modification options and treatment options were discussed and Marycatherine agreed to follow the recommendations documented in the above note.  ARRANGE: Lilygrace was educated on the importance of frequent visits to treat obesity as outlined per CMS and USPSTF guidelines and agreed to schedule her next follow up appointment today.  Attestation Statements:   Reviewed by clinician on day of visit: allergies, medications, problem list, medical history, surgical history, family history, social history, and previous encounter notes.   Leslie Davis, am acting as transcriptionist for [employer], DO.  I have reviewed the above documentation for accuracy and completeness, and I agree with the above. Leslie Davis, D.O.  The [place] was signed into law in 2016 which includes the topic of electronic health records.  This provides immediate access to information in MyChart.  This includes consultation notes, operative notes, office notes, lab results and pathology reports.  If you have any questions about what you read please let us know at your next visit so we can discuss your concerns and take corrective action if need be.  We are right here with you.

## 2021-01-27 ENCOUNTER — Other Ambulatory Visit: Payer: Self-pay

## 2021-01-27 ENCOUNTER — Telehealth: Payer: Self-pay

## 2021-01-27 ENCOUNTER — Encounter: Payer: Self-pay | Admitting: Physical Therapy

## 2021-01-27 ENCOUNTER — Ambulatory Visit (INDEPENDENT_AMBULATORY_CARE_PROVIDER_SITE_OTHER): Payer: Medicare Other | Admitting: Physical Therapy

## 2021-01-27 DIAGNOSIS — R600 Localized edema: Secondary | ICD-10-CM

## 2021-01-27 DIAGNOSIS — G8929 Other chronic pain: Secondary | ICD-10-CM

## 2021-01-27 DIAGNOSIS — M25562 Pain in left knee: Secondary | ICD-10-CM | POA: Diagnosis not present

## 2021-01-27 DIAGNOSIS — R2689 Other abnormalities of gait and mobility: Secondary | ICD-10-CM | POA: Diagnosis not present

## 2021-01-27 DIAGNOSIS — M6281 Muscle weakness (generalized): Secondary | ICD-10-CM

## 2021-01-27 DIAGNOSIS — M25662 Stiffness of left knee, not elsewhere classified: Secondary | ICD-10-CM

## 2021-01-27 NOTE — Chronic Care Management (AMB) (Signed)
01/27/2021- Called patient to inform [facility] will be delivering an partial order of her medications to help sync with [place]. This way we can get all her medications to be filled and delivered on the same day. Patient agrees with plan.  Pattricia Boss, [place] Pharmacist Assistant [phone]

## 2021-01-27 NOTE — Therapy (Signed)
[facility] [address] [place], [place], [address] Phone: [phone]   Fax:  [phone]  Physical Therapy Treatment  Patient Details  Name: Leslie Davis MRN: [id number] Date of Birth: 11-03-1955 Referring Provider (PT): Frankey Shown MD   Encounter Date: 01/27/2021   PT End of Session - 01/27/21 1137     Visit Number 22    Number of Visits 24    Date for PT Re-Evaluation 02/03/21    Authorization Type UHC MCR    Progress Note Due on Visit 63    PT Start Time 1100    PT Stop Time 1139    PT Time Calculation (min) 39 min    Activity Tolerance Patient tolerated treatment well    Behavior During Therapy WFL for tasks assessed/performed             Past Medical History:  Diagnosis Date   DM (diabetes mellitus) ([place])    Fatty liver disease, nonalcoholic    Gastritis    Gastroparesis    GERD (gastroesophageal reflux disease)    Heartburn    Hiatal hernia    HLD (hyperlipidemia)    HTN (hypertension)    Joint pain    Renal calculus    Rheumatoid arthritis ([place])    Vitamin D deficiency     Past Surgical History:  Procedure Laterality Date   CHOLECYSTECTOMY     COLONOSCOPY     TOTAL KNEE ARTHROPLASTY Left 10/27/2020   Procedure: LEFT TOTAL KNEE ARTHROPLASTY;  Surgeon: [name], MD;  Location: [facility];  Service: Orthopedics;  Laterality: Left;   TUBAL LIGATION     UPPER GASTROINTESTINAL ENDOSCOPY      There were no vitals filed for this visit.   Subjective Assessment - 01/27/21 1102     Subjective a little sore and stiff this morning    Pertinent History DM, HTN    Patient Stated Goals Get back to my old self; no walker    Currently in Pain? Yes    Pain Score 0-No pain   denies pain, reports some "aching"   Pain Location Knee    Pain Orientation Left    Pain Descriptors / Indicators Aching    Pain Type Surgical pain    Pain Onset More than a month ago    Pain Frequency Intermittent    Aggravating Factors   bending, stiffness    Pain Relieving Factors nothing specific                               [facility] Adult PT Treatment/Exercise - 01/27/21 1104       Knee/Hip Exercises: Stretches   Gastroc Stretch Both;2 reps;30 seconds    Gastroc Stretch Limitations slantboard      Knee/Hip Exercises: Aerobic   Recumbent Bike seat 6; partial revolutions x 8 min      Knee/Hip Exercises: Machines for Strengthening   Cybex Knee Extension 5 lbs LLE only 3x10reps    Cybex Knee Flexion 10# LLE only 3x10    Cybex Leg Press 50# seat position 1 for 3sets x 10 reps  LLE only; 1 min flexion hold between sets      Knee/Hip Exercises: Standing   Heel Raises 3 sets;10 reps    Side Lunges Both;10 reps    Other Standing Knee Exercises Lt foot on step - up/over/back x 10 reps  Balance Exercises - 01/27/21 1127       Balance Exercises: Standing   Tandem Stance Eyes open;Intermittent upper extremity support;3 reps;30 secs    SLS Eyes open;Upper extremity support 1;3 reps;30 secs   LLE only                 PT Short Term Goals - 01/14/21 1058       PT SHORT TERM GOAL #1   Title Pt will be I and compliant with HEP.    Time 4    Period Weeks    Status Achieved      PT SHORT TERM GOAL #2   Title Pt able to safely ambulate community distances without AD    Baseline -    Time 4    Period Weeks    Status Achieved               PT Long Term Goals - 01/21/21 0254       PT LONG TERM GOAL #1   Title Pt will improve FOTO to >= 70    Time 4    Period Weeks    Status On-going    Target Date 02/03/21      PT LONG TERM GOAL #2   Title Pt will improve Rt knee  AROM to 3-120 deg to normalize gait and transfers.    Time 4    Period Weeks    Status On-going    Target Date 02/03/21      PT LONG TERM GOAL #3   Title Pt will improve Lt hip/knee strength to overall 4+ to improve function.    Time 4    Period Weeks    Status Achieved    Target Date  02/03/21      PT LONG TERM GOAL #4   Title Pt will report less than 3-4/10 pain with standing/walking at least 30 minutes and negotiating her 3 steps to enter her house.    Time 8    Period Weeks    Status On-going    Target Date 02/03/21                   Plan - 01/27/21 1137     Clinical Impression Statement Pt demonstrating steady improvement in strength and balance and now able to complete 3x10 of knee extension 5# independently without assistance.  Will plan to update HEP next visit in preparation for transition to home and community fitness.    Personal Factors and Comorbidities Comorbidity 2;Past/Current Experience    Comorbidities DM, HTN    Examination-Activity Limitations Locomotion Level;Transfers    Examination-Participation Restrictions Cleaning;Other   working out at gym   Stability/Clinical Decision Making Stable/Uncomplicated    Rehab Potential Excellent    PT Frequency 2x / week    PT Duration 4 weeks    PT Treatment/Interventions ADLs/Self Care Home Management;Aquatic Therapy;Cryotherapy;Electrical Stimulation;Moist Heat;Balance training;Therapeutic exercise;Therapeutic activities;Functional mobility training;Stair training;Gait training;Patient/family education;Manual techniques;Passive range of motion;Scar mobilization;Taping;Vasopneumatic Device    PT Next Visit Plan aggressive flexion, quad strengthening; standing balance, update HEP and begin d/c planning    PT Home Exercise Plan [email or web address]    Consulted and Agree with Plan of Care Patient             Patient will benefit from skilled therapeutic intervention in order to improve the following deficits and impairments:  Abnormal gait, Decreased range of motion, Pain, Impaired flexibility, Decreased balance, Decreased strength, Increased edema, Postural dysfunction  Visit Diagnosis: Stiffness  of left knee, not elsewhere classified  Chronic pain of left knee  Localized edema  Muscle weakness  (generalized)  Other abnormalities of gait and mobility     Problem List Patient Active Problem List   Diagnosis Date Noted   Class 1 obesity due to excess calories with serious comorbidity and body mass index (BMI) of 31.0 to 31.9 in adult 12/01/2020   Status post total left knee replacement 10/27/2020   Primary osteoarthritis of left knee    NAFLD (nonalcoholic fatty liver disease) 08/06/2020   Other fatigue 07/23/2020   SOBOE (shortness of breath on exertion) 07/23/2020   Diabetes mellitus ([place]) 07/23/2020   Hypertension associated with type 2 diabetes mellitus ([place]) 07/23/2020   Hyperlipidemia associated with type 2 diabetes mellitus ([place]) 07/23/2020   Absolute anemia 07/23/2020   Vitamin D deficiency 07/23/2020   At risk for heart disease 07/23/2020   AKI (acute kidney injury) ([place]) 09/07/2019   Acute kidney injury ([place]) 09/06/2019   Bradycardia 09/06/2019   Hyperglycemia 09/06/2019   HLD (hyperlipidemia)    Hypertensive nephropathy 05/16/2019   Erosive gastritis 11/16/2017   Fatty liver 11/16/2017   Abdominal pain 12/11/2012   Early satiety 12/11/2012   Hiatal hernia    Hx of cholecystectomy 02/05/2011   Flu-like symptoms 02/05/2011   CONSTIPATION 08/27/2008   FATTY LIVER DISEASE 05/07/2008   Nausea with vomiting 04/23/2008   EPIGASTRIC PAIN 04/23/2008   RENAL CALCULUS 04/22/2008   Type 2 diabetes mellitus with stage 2 chronic kidney disease, with long-term current use of insulin ([place]) 05/19/2006   Type 2 diabetes mellitus with hyperlipidemia ([place]) 05/19/2006   HYPERTENSION, BENIGN SYSTEMIC 05/19/2006   GASTROESOPHAGEAL REFLUX, NO ESOPHAGITIS 05/19/2006   AMENORRHEA 05/19/2006      Laureen Abrahams, PT, DPT 01/27/21 11:40 AM     [facility] [address] [place], [place], [address] Phone: [phone]   Fax:  [phone]  Name: Leslie Davis MRN: [id number] Date of Birth: [birth year]

## 2021-01-29 ENCOUNTER — Encounter: Payer: Self-pay | Admitting: Physical Therapy

## 2021-01-29 ENCOUNTER — Other Ambulatory Visit: Payer: Self-pay

## 2021-01-29 ENCOUNTER — Ambulatory Visit (INDEPENDENT_AMBULATORY_CARE_PROVIDER_SITE_OTHER): Payer: Medicare Other | Admitting: Physical Therapy

## 2021-01-29 DIAGNOSIS — R2689 Other abnormalities of gait and mobility: Secondary | ICD-10-CM | POA: Diagnosis not present

## 2021-01-29 DIAGNOSIS — R600 Localized edema: Secondary | ICD-10-CM | POA: Diagnosis not present

## 2021-01-29 DIAGNOSIS — M6281 Muscle weakness (generalized): Secondary | ICD-10-CM | POA: Diagnosis not present

## 2021-01-29 DIAGNOSIS — M25562 Pain in left knee: Secondary | ICD-10-CM

## 2021-01-29 DIAGNOSIS — M25662 Stiffness of left knee, not elsewhere classified: Secondary | ICD-10-CM

## 2021-01-29 DIAGNOSIS — G8929 Other chronic pain: Secondary | ICD-10-CM

## 2021-01-29 NOTE — Patient Instructions (Signed)
Access Code: [email or web address] URL: [email or web address] Date: 01/29/2021 Prepared by: Faustino Congress  Exercises Supine Quad Set - 3 x daily - 7 x weekly - 1 sets - 10 reps - 10 hold Seated Knee Flexion Extension AROM - 3 x daily - 7 x weekly - 1 sets - 10 reps Sit to Stand - 1 x daily - 7 x weekly - 3 sets - 10 reps Seated Straight Leg Heel Taps - 1 x daily - 7 x weekly - 3 sets - 10 reps Supine Heel Slide (Mirrored) - 2 x daily - 7 x weekly - 3 sets - 10 reps - 2 hold Step Up - 1 x daily - 7 x weekly - 3 sets - 10 reps Lateral Step Up - 1 x daily - 7 x weekly - 3 sets - 10 reps Mini Squat with Counter Support - 1 x daily - 7 x weekly - 3 sets - 10 reps Kettlebell Deadlift - 1 x daily - 7 x weekly - 3 sets - 10 reps

## 2021-01-29 NOTE — Therapy (Signed)
[facility] [address] [place], [place], [address] Phone: [phone]   Fax:  [phone]  Physical Therapy Treatment  Patient Details  Name: Leslie Davis MRN: [id number] Date of Birth: 1955/11/01 Referring Provider (PT): Frankey Shown MD   Encounter Date: 01/29/2021   PT End of Session - 01/29/21 0924     Visit Number 23    Number of Visits 24    Date for PT Re-Evaluation 02/03/21    Authorization Type UHC MCR    Progress Note Due on Visit 23    PT Start Time 0845    PT Stop Time 0924    PT Time Calculation (min) 39 min    Activity Tolerance Patient tolerated treatment well    Behavior During Therapy [employer] for tasks assessed/performed             Past Medical History:  Diagnosis Date   DM (diabetes mellitus) ([place])    Fatty liver disease, nonalcoholic    Gastritis    Gastroparesis    GERD (gastroesophageal reflux disease)    Heartburn    Hiatal hernia    HLD (hyperlipidemia)    HTN (hypertension)    Joint pain    Renal calculus    Rheumatoid arthritis ([place])    Vitamin D deficiency     Past Surgical History:  Procedure Laterality Date   CHOLECYSTECTOMY     COLONOSCOPY     TOTAL KNEE ARTHROPLASTY Left 10/27/2020   Procedure: LEFT TOTAL KNEE ARTHROPLASTY;  Surgeon: [name], MD;  Location: [facility];  Service: Orthopedics;  Laterality: Left;   TUBAL LIGATION     UPPER GASTROINTESTINAL ENDOSCOPY      There were no vitals filed for this visit.   Subjective Assessment - 01/29/21 0851     Subjective a little sore and stiff this morning    Pertinent History DM, HTN    Patient Stated Goals Get back to my old self; no walker    Currently in Pain? No/denies                [facility] PT Assessment - 01/29/21 0910       Assessment   Medical Diagnosis s/p Lt TKR    Referring Provider (PT) Frankey Shown MD    Onset Date/Surgical Date 10/27/20      AROM   Left Knee Extension -1   seated LAQ   Left Knee Flexion 107                            [facility] Adult PT Treatment/Exercise - 01/29/21 0852       Knee/Hip Exercises: Aerobic   Recumbent Bike seat 6; partial revolutions x 8 min      Knee/Hip Exercises: Standing   Lateral Step Up Both;10 reps;Hand Hold: 1;Step Height: 6"    Forward Step Up 10 reps;Hand Hold: 1;Step Height: 6";Both    Functional Squat 10 reps    Other Standing Knee Exercises RDL 10# KB x10 reps      Knee/Hip Exercises: Seated   Other Seated Knee/Hip Exercises seated SLR 2x10                       PT Short Term Goals - 01/14/21 1058       PT SHORT TERM GOAL #1   Title Pt will be I and compliant with HEP.    Time 4    Period Weeks  Status Achieved      PT SHORT TERM GOAL #2   Title Pt able to safely ambulate community distances without AD    Baseline -    Time 4    Period Weeks    Status Achieved               PT Long Term Goals - 01/21/21 0277       PT LONG TERM GOAL #1   Title Pt will improve FOTO to >= 70    Time 4    Period Weeks    Status On-going    Target Date 02/03/21      PT LONG TERM GOAL #2   Title Pt will improve Rt knee  AROM to 3-120 deg to normalize gait and transfers.    Time 4    Period Weeks    Status On-going    Target Date 02/03/21      PT LONG TERM GOAL #3   Title Pt will improve Lt hip/knee strength to overall 4+ to improve function.    Time 4    Period Weeks    Status Achieved    Target Date 02/03/21      PT LONG TERM GOAL #4   Title Pt will report less than 3-4/10 pain with standing/walking at least 30 minutes and negotiating her 3 steps to enter her house.    Time 8    Period Weeks    Status On-going    Target Date 02/03/21                   Plan - 01/29/21 4128     Clinical Impression Statement HEP updated today to include higher level strengthening activities.  Anticipate d/c next week.    Personal Factors and Comorbidities Comorbidity 2;Past/Current Experience     Comorbidities DM, HTN    Examination-Activity Limitations Locomotion Level;Transfers    Examination-Participation Restrictions Cleaning;Other   working out at gym   Stability/Clinical Decision Making Stable/Uncomplicated    Rehab Potential Excellent    PT Frequency 2x / week    PT Duration 4 weeks    PT Treatment/Interventions ADLs/Self Care Home Management;Aquatic Therapy;Cryotherapy;Electrical Stimulation;Moist Heat;Balance training;Therapeutic exercise;Therapeutic activities;Functional mobility training;Stair training;Gait training;Patient/family education;Manual techniques;Passive range of motion;Scar mobilization;Taping;Vasopneumatic Device    PT Next Visit Plan check goals and plan for d/c    PT Home Exercise Plan [id number]    Consulted and Agree with Plan of Care Patient             Patient will benefit from skilled therapeutic intervention in order to improve the following deficits and impairments:  Abnormal gait, Decreased range of motion, Pain, Impaired flexibility, Decreased balance, Decreased strength, Increased edema, Postural dysfunction  Visit Diagnosis: Stiffness of left knee, not elsewhere classified  Chronic pain of left knee  Localized edema  Muscle weakness (generalized)  Other abnormalities of gait and mobility     Problem List Patient Active Problem List   Diagnosis Date Noted   Class 1 obesity due to excess calories with serious comorbidity and body mass index (BMI) of 31.0 to 31.9 in adult 12/01/2020   Status post total left knee replacement 10/27/2020   Primary osteoarthritis of left knee    NAFLD (nonalcoholic fatty liver disease) 08/06/2020   Other fatigue 07/23/2020   SOBOE (shortness of breath on exertion) 07/23/2020   Diabetes mellitus ([place]) 07/23/2020   Hypertension associated with type 2 diabetes mellitus ([place]) 07/23/2020   Hyperlipidemia associated with type 2  diabetes mellitus ([place]) 07/23/2020   Absolute anemia 07/23/2020   Vitamin D  deficiency 07/23/2020   At risk for heart disease 07/23/2020   AKI (acute kidney injury) ([place]) 09/07/2019   Acute kidney injury ([place]) 09/06/2019   Bradycardia 09/06/2019   Hyperglycemia 09/06/2019   HLD (hyperlipidemia)    Hypertensive nephropathy 05/16/2019   Erosive gastritis 11/16/2017   Fatty liver 11/16/2017   Abdominal pain 12/11/2012   Early satiety 12/11/2012   Hiatal hernia    Hx of cholecystectomy 02/05/2011   Flu-like symptoms 02/05/2011   CONSTIPATION 08/27/2008   FATTY LIVER DISEASE 05/07/2008   Nausea with vomiting 04/23/2008   EPIGASTRIC PAIN 04/23/2008   RENAL CALCULUS 04/22/2008   Type 2 diabetes mellitus with stage 2 chronic kidney disease, with long-term current use of insulin ([place]) 05/19/2006   Type 2 diabetes mellitus with hyperlipidemia ([place]) 05/19/2006   HYPERTENSION, BENIGN SYSTEMIC 05/19/2006   GASTROESOPHAGEAL REFLUX, NO ESOPHAGITIS 05/19/2006   AMENORRHEA 05/19/2006      Laureen Abrahams, PT, DPT 01/29/21 9:26 AM    [facility] [address] [place], [place], [address] Phone: [phone]   Fax:  [phone]  Name: JONNI OELKERS MRN: [id number] Date of Birth: [birth year]

## 2021-02-02 ENCOUNTER — Encounter: Payer: Medicare Other | Admitting: Physical Therapy

## 2021-02-03 ENCOUNTER — Telehealth (INDEPENDENT_AMBULATORY_CARE_PROVIDER_SITE_OTHER): Payer: Medicare Other | Admitting: Nurse Practitioner

## 2021-02-03 ENCOUNTER — Other Ambulatory Visit: Payer: Self-pay

## 2021-02-03 ENCOUNTER — Other Ambulatory Visit: Payer: Self-pay | Admitting: Nurse Practitioner

## 2021-02-03 ENCOUNTER — Encounter: Payer: Self-pay | Admitting: Nurse Practitioner

## 2021-02-03 VITALS — BP 142/87

## 2021-02-03 DIAGNOSIS — R0989 Other specified symptoms and signs involving the circulatory and respiratory systems: Secondary | ICD-10-CM | POA: Diagnosis not present

## 2021-02-03 DIAGNOSIS — R059 Cough, unspecified: Secondary | ICD-10-CM

## 2021-02-03 DIAGNOSIS — J101 Influenza due to other identified influenza virus with other respiratory manifestations: Secondary | ICD-10-CM | POA: Diagnosis not present

## 2021-02-03 LAB — POC INFLUENZA A&B (BINAX/QUICKVUE)
Influenza A, POC: POSITIVE — AB
Influenza B, POC: NEGATIVE

## 2021-02-03 LAB — POC COVID19 BINAXNOW: SARS Coronavirus 2 Ag: NEGATIVE

## 2021-02-03 MED ORDER — ALBUTEROL SULFATE HFA 108 (90 BASE) MCG/ACT IN AERS
2.0000 | INHALATION_SPRAY | Freq: Four times a day (QID) | RESPIRATORY_TRACT | 2 refills | Status: DC | PRN
Start: 2021-02-03 — End: 2021-02-04

## 2021-02-03 MED ORDER — OSELTAMIVIR PHOSPHATE 75 MG PO CAPS: 75.0000 mg | ORAL_CAPSULE | Freq: Two times a day (BID) | ORAL | 0 refills | Status: AC

## 2021-02-03 MED ORDER — OSELTAMIVIR PHOSPHATE 75 MG PO CAPS: 75.0000 mg | ORAL_CAPSULE | Freq: Two times a day (BID) | ORAL | 0 refills | Status: DC

## 2021-02-03 NOTE — Progress Notes (Addendum)
This visit occurred during the SARS-CoV-2 public health emergency.  Safety protocols were in place, including screening questions prior to the visit, additional usage of staff PPE, and extensive cleaning of exam room while observing appropriate contact time as indicated for disinfecting solutions.  Subjective:     Patient ID: Leslie Davis , female    DOB: 11/21/1955 , 65 y.o.   MRN: [id number]   Chief Complaint  Patient presents with   URI    HPI  Patient seen outside  URI  This is a new problem. The current episode started in the past 7 days (Sunday). The problem has been gradually worsening. There has been no fever. Associated symptoms include coughing. Pertinent negatives include no abdominal pain, chest pain, diarrhea, headaches, nausea, rhinorrhea, sinus pain, sore throat or wheezing. She has tried nothing for the symptoms.    Past Medical History:  Diagnosis Date   DM (diabetes mellitus) ([place])    Fatty liver disease, nonalcoholic    Gastritis    Gastroparesis    GERD (gastroesophageal reflux disease)    Heartburn    Hiatal hernia    HLD (hyperlipidemia)    HTN (hypertension)    Joint pain    Renal calculus    Rheumatoid arthritis ([place])    Vitamin D deficiency      Family History  Problem Relation Age of Onset   Diabetes Mother    Thyroid disease Mother    Kidney disease Mother    Diabetes Maternal Grandmother    Stomach cancer Maternal Aunt        X 2 aunts   Hypertension Father    Hyperlipidemia Father    Sudden death Father    Stroke Father    Colon cancer Neg Hx    Esophageal cancer Neg Hx    Rectal cancer Neg Hx      Current Outpatient Medications:    albuterol (VENTOLIN HFA) 108 (90 Base) MCG/ACT inhaler, Inhale 2 puffs into the lungs every 6 (six) hours as needed for wheezing or shortness of breath., Disp: 8 g, Rfl: 2   amLODipine (NORVASC) 10 MG tablet, TAKE ONE TABLET BY MOUTH ONCE DAILY, Disp: 90 tablet, Rfl: 3   aspirin EC 81 MG tablet,  Take 1 tablet (81 mg total) by mouth 2 (two) times daily. To be taken after surgery, Disp: 84 tablet, Rfl: 0   Azilsartan Medoxomil (EDARBI) 80 MG TABS, TAKE 1 TABLET EACH DAY., Disp: 90 tablet, Rfl: 2   Blood Glucose Monitoring Suppl (ONETOUCH VERIO) w/Device KIT, Use as directed to check blood sugars 2 times per day dx: e11.65, Disp: 1 kit, Rfl: 1   cetirizine (ZYRTEC) 10 MG tablet, Take 1 tablet (10 mg total) by mouth daily., Disp: 90 tablet, Rfl: 2   Cholecalciferol (VITAMIN D3) 1.25 MG (50000 UT) CAPS, Take 1 capsule by mouth every 2 wks, Disp: 2 capsule, Rfl: 0   dapagliflozin propanediol (FARXIGA) 10 MG TABS tablet, Take 1 tablet (10 mg total) by mouth daily before breakfast., Disp: 90 tablet, Rfl: 1   ferrous gluconate (FERGON) 324 MG tablet, TAKE 1 TABLET BY MOUTH TWICE DAILY WITH A MEAL. (Patient taking differently: Take 324 mg by mouth 2 (two) times daily with a meal.), Disp: 100 tablet, Rfl: 0   glucose blood (ONETOUCH VERIO) test strip, Use as instructed to check blood sugars 2 times per day dx:e11.65, Disp: 150 each, Rfl: 3   ibuprofen (ADVIL) 800 MG tablet, Take 1 tablet (800 mg total) by mouth  every 8 (eight) hours as needed., Disp: 30 tablet, Rfl: 2   insulin glargine, 1 Unit Dial, (TOUJEO SOLOSTAR) 300 UNIT/ML Solostar Pen, Inject 30 Units into the skin at bedtime., Disp: , Rfl:    insulin lispro (HUMALOG KWIKPEN) 100 UNIT/ML KwikPen, Sliding scale (Patient taking differently: Inject 25 Units into the skin daily as needed (CBG over 150). Sliding scale), Disp: 30 mL, Rfl: 2   linaclotide ([name]) 145 MCG CAPS capsule, Take 1 capsule (145 mcg total) by mouth daily., Disp: 30 capsule, Rfl: 0   OneTouch Delica Lancets 49F MISC, Use as directed to check blood sugars 2 times per day dx:e11.65, Disp: 150 each, Rfl: 3   pantoprazole (PROTONIX) 40 MG tablet, Take one tablet by mouth 3 days a week, Disp: 90 tablet, Rfl: 2   rosuvastatin (CRESTOR) 20 MG tablet, Take 1 tablet (20 mg total) by  mouth daily., Disp: 90 tablet, Rfl: 1   Semaglutide, 1 MG/DOSE, (OZEMPIC, 1 MG/DOSE,) 4 MG/3ML SOPN, Inject 1 mg into the skin once a week. (Patient taking differently: Inject 1 mg into the skin once a week. Tuesday), Disp: 9 mL, Rfl: 3   temazepam (RESTORIL) 30 MG capsule, TAKE ONE CAPSULE BY MOUTH EVERYDAY AT BEDTIME AS NEEDED, Disp: 60 capsule, Rfl: 2   TRUEPLUS PEN NEEDLES 31G X 6 MM MISC, USE WITH pen TO INJECT insulin DAILY, Disp: 150 each, Rfl: 3   HYDROcodone-acetaminophen (NORCO) 5-325 MG tablet, Take 1 tablet by mouth 2 (two) times daily between meals as needed. (Patient not taking: Reported on 02/03/2021), Disp: 20 tablet, Rfl: 0   oseltamivir (TAMIFLU) 75 MG capsule, Take 1 capsule (75 mg total) by mouth 2 (two) times daily for 5 days., Disp: 10 capsule, Rfl: 0   Allergies  Allergen Reactions   Fiasp [Insulin Aspart (W-Niacinamide)] Hives     Review of Systems  Constitutional: Negative.   HENT:  Negative for rhinorrhea, sinus pain and sore throat.   Respiratory:  Positive for cough. Negative for wheezing.   Cardiovascular:  Negative for chest pain, palpitations and leg swelling.  Gastrointestinal:  Negative for abdominal pain, diarrhea and nausea.  Neurological: Negative.  Negative for headaches.  Psychiatric/Behavioral: Negative.    All other systems reviewed and are negative.   Today's Vitals   02/03/21 1421  BP: (!) 142/87   There is no height or weight on file to calculate BMI.   Objective:  Physical Exam Vitals reviewed.  Constitutional:      General: She is not in acute distress.    Appearance: Normal appearance. She is well-developed. She is obese.  Cardiovascular:     Rate and Rhythm: Normal rate and regular rhythm.     Pulses: Normal pulses.     Heart sounds: Normal heart sounds. No murmur heard. Pulmonary:     Effort: Pulmonary effort is normal. No respiratory distress.     Breath sounds: Normal breath sounds. No wheezing.  Chest:     Chest wall: No  tenderness.  Musculoskeletal:        General: Normal range of motion.  Skin:    General: Skin is warm and dry.     Capillary Refill: Capillary refill takes less than 2 seconds.  Neurological:     General: No focal deficit present.     Mental Status: She is alert and oriented to person, place, and time.     Motor: No weakness.  Psychiatric:        Mood and Affect: Mood normal.  Behavior: Behavior normal.        Thought Content: Thought content normal.        Judgment: Judgment normal.        Assessment And Plan:     1. Cough, unspecified type Comments: Rx sent for albuterol inhaler, if symptoms worsen return call to office or go to ER - POC Influenza A&B(BINAX/QUICKVUE) - POC COVID-19 - albuterol (VENTOLIN HFA) 108 (90 Base) MCG/ACT inhaler; Inhale 2 puffs into the lungs every 6 (six) hours as needed for wheezing or shortness of breath.  Dispense: 8 g; Refill: 2 - Novel Coronavirus, NAA ([facility])  2. Runny nose  3. Influenza A Comments: positive rapid Influenza A, will treat with Tamiflu. will also send PCR, negative rapid covid and influenza B - oseltamivir (TAMIFLU) 75 MG capsule; Take 1 capsule (75 mg total) by mouth 2 (two) times daily for 5 days.  Dispense: 10 capsule; Refill: 0     Patient was given opportunity to ask questions. Patient verbalized understanding of the plan and was able to repeat key elements of the plan. All questions were answered to their satisfaction.  [name], FNP   I, [name], FNP, have reviewed all documentation for this visit. The documentation on 02/04/21 for the exam, diagnosis, procedures, and orders are all accurate and complete.   IF YOU HAVE BEEN REFERRED TO A SPECIALIST, IT MAY TAKE 1-2 WEEKS TO SCHEDULE/PROCESS THE REFERRAL. IF YOU HAVE NOT HEARD FROM US/SPECIALIST IN TWO WEEKS, PLEASE GIVE [place] A CALL AT [phone] X [phone].   THE PATIENT IS ENCOURAGED TO PRACTICE SOCIAL DISTANCING DUE TO THE COVID-19 PANDEMIC.

## 2021-02-04 ENCOUNTER — Other Ambulatory Visit: Payer: Self-pay

## 2021-02-04 ENCOUNTER — Encounter: Payer: Medicare Other | Admitting: Physical Therapy

## 2021-02-04 LAB — SARS-COV-2, NAA 2 DAY TAT

## 2021-02-04 LAB — NOVEL CORONAVIRUS, NAA: SARS-CoV-2, NAA: NOT DETECTED

## 2021-02-04 MED ORDER — PROAIR HFA 108 (90 BASE) MCG/ACT IN AERS: INHALATION_SPRAY | RESPIRATORY_TRACT | 1 refills | Status: DC

## 2021-02-05 ENCOUNTER — Other Ambulatory Visit: Payer: Self-pay

## 2021-02-05 MED ORDER — AZITHROMYCIN 250 MG PO TABS: ORAL_TABLET | ORAL | 0 refills | Status: DC

## 2021-02-10 ENCOUNTER — Ambulatory Visit (INDEPENDENT_AMBULATORY_CARE_PROVIDER_SITE_OTHER): Payer: Medicare Other | Admitting: Family Medicine

## 2021-02-10 ENCOUNTER — Other Ambulatory Visit: Payer: Self-pay

## 2021-02-10 ENCOUNTER — Encounter (INDEPENDENT_AMBULATORY_CARE_PROVIDER_SITE_OTHER): Payer: Self-pay | Admitting: Family Medicine

## 2021-02-10 VITALS — BP 180/77 | HR 53 | Temp 97.9°F | Ht 64.0 in | Wt 180.0 lb

## 2021-02-10 DIAGNOSIS — N1831 Chronic kidney disease, stage 3a: Secondary | ICD-10-CM | POA: Diagnosis not present

## 2021-02-10 DIAGNOSIS — I152 Hypertension secondary to endocrine disorders: Secondary | ICD-10-CM

## 2021-02-10 DIAGNOSIS — Z6835 Body mass index (BMI) 35.0-35.9, adult: Secondary | ICD-10-CM

## 2021-02-10 DIAGNOSIS — Z794 Long term (current) use of insulin: Secondary | ICD-10-CM | POA: Diagnosis not present

## 2021-02-10 DIAGNOSIS — E1159 Type 2 diabetes mellitus with other circulatory complications: Secondary | ICD-10-CM

## 2021-02-10 DIAGNOSIS — E1122 Type 2 diabetes mellitus with diabetic chronic kidney disease: Secondary | ICD-10-CM

## 2021-02-10 NOTE — Progress Notes (Signed)
Chief Complaint:   OBESITY Leslie Davis is here to discuss her progress with her obesity treatment plan along with follow-up of her obesity related diagnoses. Leslie Davis is on the Category 1 Plan and states she is following her eating plan approximately 90% of the time. Leslie Davis states she is doing gym exercise for 60 minutes 2 times per week.  Today's visit was #: 11 Starting weight: 208 lbs Starting date: 07/23/2020 Today's weight: 180 lbs Today's date: 02/10/2021 Total lbs lost to date: 28 lbs Total lbs lost since last in-office visit: 0  Interim History: Leslie Davis has maintained her weight today. She feels hunger is mostly well controlled. She has occasional sweet cravings. She does resistance and cardio at cardio at the gym. She drinks an adequate amount of water. She does consume the recommended protein amount.  Subjective:   1. Type 2 diabetes mellitus with stage 3a chronic kidney disease, with long-term current use of insulin (HCC) Leslie Davis's Type 2 diabetes mellitus is well controlled. Her last A1C was 6.3. She is on Farxiga, Toujeo 25 units at bedtime, Ozempic 1 mg weekly and sliding scale Humalog as needed for CBGs > 150. She reports she has not needed to take any Humalog in a while. Leslie Davis has not had hypoglycemia.  Lab Results  Component Value Date   HGBA1C 6.3 (H) 10/22/2020   HGBA1C 6.8 (H) 08/25/2020   HGBA1C 8.7 (H) 05/28/2020   Lab Results  Component Value Date   MICROALBUR 150 11/15/2019   LDLCALC 78 11/20/2020   CREATININE 1.19 12/23/2020   No results found for: INSULIN   2. Hypertension associated with type 2 diabetes mellitus ([place]) Leslie Davis's blood pressure is elevated again today. She notes blood pressure are up and down at home but usually run 150s/70s. She denies headaches, chest pains, shortness of breath and epistaxis. She is on Amlodipine and azilsartan only. She sees PCP on 02-26-2021.  BP Readings from Last 3 Encounters:  02/10/21 (!) 180/77  02/03/21  (!) 142/87  01/22/21 (!) 154/77     Assessment/Plan:   1. Type 2 diabetes mellitus with stage 3a chronic kidney disease, with long-term current use of insulin (HCC) Continue all meds at current doses.  2. Hypertension associated with type 2 diabetes mellitus ([place]) Leslie Davis will discuss elevated blood pressure with primary care provider at her upcoming appointment. We discussed the importance of good BP control.   She will continue all medications at current doses.   3. Obesity with current BMI of 30.88 Leslie Davis is currently in the action stage of change. As such, her goal is to continue with weight loss efforts. She has agreed to the Category 1 Plan.   Handouts: Holiday Strategies.  Exercise goals:  [name] will increase gym visits to 3 times per week.  Behavioral modification strategies: planning for success.  Leslie Davis has agreed to follow-up with our clinic in 3 weeks (fasting).   Objective:   Blood pressure (!) 180/77, pulse (!) 53, temperature 97.9 F (36.6 C), height 5\' 4"  (1.626 m), weight 180 lb (81.6 kg), SpO2 98 %. Body mass index is 30.9 kg/m.  General: Cooperative, alert, well developed, in no acute distress. HEENT: Conjunctivae and lids unremarkable. Cardiovascular: Regular rhythm.  Lungs: Normal work of breathing. Neurologic: No focal deficits.   Lab Results  Component Value Date   CREATININE 1.19 12/23/2020   BUN 19 12/23/2020   NA 139 12/23/2020   K 3.6 12/23/2020   CL 106 12/23/2020   CO2 24 12/23/2020  Lab Results  Component Value Date   ALT 28 11/20/2020   AST 23 11/20/2020   ALKPHOS 289 (H) 11/20/2020   BILITOT <0.2 11/20/2020   Lab Results  Component Value Date   HGBA1C 6.3 (H) 10/22/2020   HGBA1C 6.8 (H) 08/25/2020   HGBA1C 8.7 (H) 05/28/2020   HGBA1C 8.8 (H) 02/21/2020   HGBA1C 10.5 (H) 11/15/2019   No results found for: INSULIN Lab Results  Component Value Date   TSH 1.790 07/23/2020   Lab Results  Component Value Date   CHOL  156 11/20/2020   HDL 61 11/20/2020   LDLCALC 78 11/20/2020   TRIG 89 11/20/2020   CHOLHDL 2.6 11/20/2020   Lab Results  Component Value Date   VD25OH 102.0 (H) 11/20/2020   VD25OH 95.2 10/22/2020   VD25OH 112.0 (H) 07/23/2020   Lab Results  Component Value Date   WBC 5.1 12/23/2020   HGB 11.2 (L) 12/23/2020   HCT 34.9 (L) 12/23/2020   MCV 85.1 12/23/2020   PLT 256.0 12/23/2020   Lab Results  Component Value Date   IRON 68 11/16/2017   TIBC 355 11/16/2017   FERRITIN 45.2 11/16/2017   Attestation Statements:   Reviewed by clinician on day of visit: allergies, medications, problem list, medical history, surgical history, family history, social history, and previous encounter notes.  I, Lizbeth Bark, RMA, am acting as Location manager for [employer], [place].   I have reviewed the above documentation for accuracy and completeness, and I agree with the above. -  Georgianne Fick, FNP

## 2021-02-11 ENCOUNTER — Ambulatory Visit (INDEPENDENT_AMBULATORY_CARE_PROVIDER_SITE_OTHER): Payer: Medicare Other | Admitting: Orthopaedic Surgery

## 2021-02-11 ENCOUNTER — Ambulatory Visit (INDEPENDENT_AMBULATORY_CARE_PROVIDER_SITE_OTHER): Payer: Medicare Other

## 2021-02-11 ENCOUNTER — Telehealth: Payer: Self-pay | Admitting: *Deleted

## 2021-02-11 DIAGNOSIS — Z96652 Presence of left artificial knee joint: Secondary | ICD-10-CM

## 2021-02-11 MED ORDER — TRAMADOL HCL 50 MG PO TABS: 50.0000 mg | ORAL_TABLET | Freq: Every day | ORAL | 0 refills | Status: DC | PRN

## 2021-02-11 NOTE — Telephone Encounter (Signed)
Ortho bundle 90 day call and survey completed. 

## 2021-02-11 NOTE — Progress Notes (Signed)
Post-Op Visit Note   Patient: Leslie Davis           Date of Birth: 14-Dec-1955           MRN: [id number] Visit Date: 02/11/2021 PCP: [name], MD   Assessment & Plan:  Chief Complaint:  Chief Complaint  Patient presents with   Left Knee - Routine Post Op, Follow-up   Visit Diagnoses:  1. Hx of total knee replacement, left     Plan: Leslie Davis is a little over 3 months status post left total knee replacement on 10/27/2020.  Overall doing well.  She has been compliant with physical therapy.  Most recent measurements was 1 207 degrees.  Takes ibuprofen daily.  Has some occasional sharp pain around the front of the knee.  She has some start up stiffness.  No real complaints otherwise.  Left knee shows a fully healed surgical scar.  Stable to varus valgus stress.  There is no signs of infection.  She is ambulating without any devices.  X-rays show stable left total knee replacement without any complications.  She does need more physical therapy to get to 120 degrees or more.  Tramadol refilled today for nighttime pain.  Recheck in 3 months with two-view x-rays of the left knee.  Follow-Up Instructions: Return in about 3 months (around 05/14/2021).   Orders:  Orders Placed This Encounter  Procedures   XR Knee 1-2 Views Left   Meds ordered this encounter  Medications   traMADol (ULTRAM) 50 MG tablet    Sig: Take 1-2 tablets (50-100 mg total) by mouth daily as needed.    Dispense:  20 tablet    Refill:  0    Imaging: XR Knee 1-2 Views Left  Result Date: 02/11/2021 Stable total knee replacement in good alignment.    PMFS History: Patient Active Problem List   Diagnosis Date Noted   Class 2 severe obesity with serious comorbidity and body mass index (BMI) of 35.0 to 35.9 in adult ([place]) 12/01/2020   Status post total left knee replacement 10/27/2020   Primary osteoarthritis of left knee    NAFLD (nonalcoholic fatty liver disease) 08/06/2020   Other fatigue  07/23/2020   SOBOE (shortness of breath on exertion) 07/23/2020   Diabetes mellitus ([place]) 07/23/2020   Hypertension associated with type 2 diabetes mellitus ([place]) 07/23/2020   Hyperlipidemia associated with type 2 diabetes mellitus ([place]) 07/23/2020   Absolute anemia 07/23/2020   Vitamin D deficiency 07/23/2020   At risk for heart disease 07/23/2020   AKI (acute kidney injury) ([place]) 09/07/2019   Acute kidney injury ([place]) 09/06/2019   Bradycardia 09/06/2019   Hyperglycemia 09/06/2019   HLD (hyperlipidemia)    Hypertensive nephropathy 05/16/2019   Erosive gastritis 11/16/2017   Fatty liver 11/16/2017   Abdominal pain 12/11/2012   Early satiety 12/11/2012   Hiatal hernia    Hx of cholecystectomy 02/05/2011   Flu-like symptoms 02/05/2011   CONSTIPATION 08/27/2008   FATTY LIVER DISEASE 05/07/2008   Nausea with vomiting 04/23/2008   EPIGASTRIC PAIN 04/23/2008   RENAL CALCULUS 04/22/2008   Type 2 diabetes mellitus with stage 2 chronic kidney disease, with long-term current use of insulin ([place]) 05/19/2006   Type 2 diabetes mellitus with hyperlipidemia ([place]) 05/19/2006   HYPERTENSION, BENIGN SYSTEMIC 05/19/2006   GASTROESOPHAGEAL REFLUX, NO ESOPHAGITIS 05/19/2006   AMENORRHEA 05/19/2006   Past Medical History:  Diagnosis Date   DM (diabetes mellitus) ([place])    Fatty liver disease, nonalcoholic    Gastritis  Gastroparesis    GERD (gastroesophageal reflux disease)    Heartburn    Hiatal hernia    HLD (hyperlipidemia)    HTN (hypertension)    Joint pain    Renal calculus    Rheumatoid arthritis ([place])    Vitamin D deficiency     Family History  Problem Relation Age of Onset   Diabetes Mother    Thyroid disease Mother    Kidney disease Mother    Diabetes Maternal Grandmother    Stomach cancer Maternal Aunt        X 2 aunts   Hypertension Father    Hyperlipidemia Father    Sudden death Father    Stroke Father    Colon cancer Neg Hx    Esophageal cancer Neg Hx    Rectal  cancer Neg Hx     Past Surgical History:  Procedure Laterality Date   CHOLECYSTECTOMY     COLONOSCOPY     TOTAL KNEE ARTHROPLASTY Left 10/27/2020   Procedure: LEFT TOTAL KNEE ARTHROPLASTY;  Surgeon: [name], MD;  Location: [facility];  Service: Orthopedics;  Laterality: Left;   TUBAL LIGATION     UPPER GASTROINTESTINAL ENDOSCOPY     Social History   Occupational History   Occupation: Housekeeping    Comment: Estate manager/land agent  Tobacco Use   Smoking status: Former    Packs/day: 0.25    Years: 1.00    Pack years: 0.25    Types: Cigarettes    Quit date: 09/10/1974    Years since quitting: 46.4   Smokeless tobacco: Never   Tobacco comments:    she no longer smokes.   Vaping Use   Vaping Use: Never used  Substance and Sexual Activity   Alcohol use: No   Drug use: No   Sexual activity: Not Currently

## 2021-02-19 ENCOUNTER — Other Ambulatory Visit: Payer: Self-pay | Admitting: Internal Medicine

## 2021-02-20 ENCOUNTER — Ambulatory Visit (INDEPENDENT_AMBULATORY_CARE_PROVIDER_SITE_OTHER): Payer: Medicare Other | Admitting: Physical Therapy

## 2021-02-20 ENCOUNTER — Encounter: Payer: Self-pay | Admitting: Physical Therapy

## 2021-02-20 ENCOUNTER — Other Ambulatory Visit: Payer: Self-pay

## 2021-02-20 DIAGNOSIS — R600 Localized edema: Secondary | ICD-10-CM

## 2021-02-20 DIAGNOSIS — M25662 Stiffness of left knee, not elsewhere classified: Secondary | ICD-10-CM

## 2021-02-20 DIAGNOSIS — G8929 Other chronic pain: Secondary | ICD-10-CM | POA: Diagnosis not present

## 2021-02-20 DIAGNOSIS — M25562 Pain in left knee: Secondary | ICD-10-CM | POA: Diagnosis not present

## 2021-02-20 DIAGNOSIS — R2689 Other abnormalities of gait and mobility: Secondary | ICD-10-CM

## 2021-02-20 DIAGNOSIS — M6281 Muscle weakness (generalized): Secondary | ICD-10-CM | POA: Diagnosis not present

## 2021-02-20 NOTE — Therapy (Signed)
[facility] [address] [place], [place], [address] Phone: [phone]   Fax:  [phone]  Physical Therapy Treatment/Recertification  Patient Details  Name: Leslie Davis MRN: [id number] Date of Birth: [birth year] Referring Provider (PT): Frankey Shown MD   Encounter Date: 02/20/2021   PT End of Session - 02/20/21 0937     Visit Number 24    Number of Visits 32    Date for PT Re-Evaluation 03/20/21    Authorization Type UHC MCR    Progress Note Due on Visit 30    PT Start Time 0930    PT Stop Time 1009    PT Time Calculation (min) 39 min    Activity Tolerance Patient tolerated treatment well    Behavior During Therapy WFL for tasks assessed/performed             Past Medical History:  Diagnosis Date   DM (diabetes mellitus) ([place])    Fatty liver disease, nonalcoholic    Gastritis    Gastroparesis    GERD (gastroesophageal reflux disease)    Heartburn    Hiatal hernia    HLD (hyperlipidemia)    HTN (hypertension)    Joint pain    Renal calculus    Rheumatoid arthritis ([place])    Vitamin D deficiency     Past Surgical History:  Procedure Laterality Date   CHOLECYSTECTOMY     COLONOSCOPY     TOTAL KNEE ARTHROPLASTY Left 10/27/2020   Procedure: LEFT TOTAL KNEE ARTHROPLASTY;  Surgeon: [name], MD;  Location: [facility];  Service: Orthopedics;  Laterality: Left;   TUBAL LIGATION     UPPER GASTROINTESTINAL ENDOSCOPY      There were no vitals filed for this visit.   Subjective Assessment - 02/20/21 0932     Subjective returns after missing 3 weeks of PT - went to MD last week and he recommended she continue with PT.    Pertinent History DM, HTN    Patient Stated Goals Get back to my old self; no walker    Currently in Pain? No/denies                [facility] PT Assessment - 02/20/21 0948       Assessment   Medical Diagnosis s/p Lt TKR    Referring Provider (PT) Frankey Shown MD    Onset Date/Surgical Date 10/27/20       Observation/Other Assessments   Focus on Therapeutic Outcomes (FOTO)  updated 67%      AROM   Left Knee Extension -7    Left Knee Flexion 102      PROM   Left Knee Extension 0    Left Knee Flexion 105                           [facility] Adult PT Treatment/Exercise - 02/20/21 0935       Self-Care   Self-Care Other Self-Care Comments    Other Self-Care Comments  educated in need for aggressive ROM at home and need to push into some discomfort to maximize ROM - pt reluctant to push into pain      Knee/Hip Exercises: Aerobic   Recumbent Bike seat 6; partial revolutions x 8 min      Knee/Hip Exercises: Machines for Strengthening   Cybex Leg Press 50# seat position 1 for 3sets x 10 reps  LLE only      Knee/Hip Exercises: Seated   Long  [employer] Left;3 sets;10 reps;Weights    Long [employer] 5 lbs.      Knee/Hip Exercises: Supine   Quad Sets Left;10 reps    Quad Sets Limitations cues for quad activation                       PT Short Term Goals - 01/14/21 1058       PT SHORT TERM GOAL #1   Title Pt will be I and compliant with HEP.    Time 4    Period Weeks    Status Achieved      PT SHORT TERM GOAL #2   Title Pt able to safely ambulate community distances without AD    Baseline -    Time 4    Period Weeks    Status Achieved               PT Long Term Goals - 02/20/21 7622       PT LONG TERM GOAL #1   Title Pt will improve FOTO to >= 70    Time 4    Period Weeks    Status On-going    Target Date 03/20/21      PT LONG TERM GOAL #2   Title Pt will improve Rt knee  AROM to 3-120 deg to normalize gait and transfers.    Time 4    Period Weeks    Status On-going    Target Date 03/20/21      PT LONG TERM GOAL #3   Title Pt will improve Lt hip/knee strength to overall 4+ to improve function.    Time 4    Period Weeks    Status Achieved      PT LONG TERM GOAL #4   Title Pt will report less than 3-4/10 pain with  standing/walking at least 30 minutes and negotiating her 3 steps to enter her house.    Baseline 12/2: up to 4/10 with standing 30 min; 1-2/10 with stairs    Time 4    Period Weeks    Status On-going    Target Date 03/20/21                   Plan - 02/20/21 6333     Clinical Impression Statement Pt returns to [place] after 3 weeks due to illness.   At last visit, pt and PT planning d/c from PT, but pt has demonstrated decline in quad strength as well as slight decrease in AROM.  Pt needs to be aggressive at home with ROM as well to maximize PT progress, and she reports this is difficult due to pain.  Will plan to continue PT 1-2x/wk x 4 additional weeks to maximize ROM and strength and ensure smooth transition to community fitness.    Personal Factors and Comorbidities Comorbidity 2;Past/Current Experience    Comorbidities DM, HTN    Examination-Activity Limitations Locomotion Level;Transfers    Examination-Participation Restrictions Cleaning;Other   working out at gym   Stability/Clinical Decision Making Stable/Uncomplicated    Rehab Potential Excellent    PT Frequency 2x / week   1-2x/wk   PT Duration 4 weeks    PT Treatment/Interventions ADLs/Self Care Home Management;Aquatic Therapy;Cryotherapy;Electrical Stimulation;Moist Heat;Balance training;Therapeutic exercise;Therapeutic activities;Functional mobility training;Stair training;Gait training;Patient/family education;Manual techniques;Passive range of motion;Scar mobilization;Taping;Vasopneumatic Device    PT Next Visit Plan continue with aggressive ROM and quad strengthening    PT Home Exercise Plan [id number]    Consulted  and Agree with Plan of Care Patient             Patient will benefit from skilled therapeutic intervention in order to improve the following deficits and impairments:  Abnormal gait, Decreased range of motion, Pain, Impaired flexibility, Decreased balance, Decreased strength, Increased edema, Postural  dysfunction  Visit Diagnosis: Stiffness of left knee, not elsewhere classified - Plan: PT plan of care cert/re-cert  Chronic pain of left knee - Plan: PT plan of care cert/re-cert  Localized edema - Plan: PT plan of care cert/re-cert  Muscle weakness (generalized) - Plan: PT plan of care cert/re-cert  Other abnormalities of gait and mobility - Plan: PT plan of care cert/re-cert     Problem List Patient Active Problem List   Diagnosis Date Noted   Class 2 severe obesity with serious comorbidity and body mass index (BMI) of 35.0 to 35.9 in adult ([place]) 12/01/2020   Status post total left knee replacement 10/27/2020   Primary osteoarthritis of left knee    NAFLD (nonalcoholic fatty liver disease) 08/06/2020   Other fatigue 07/23/2020   SOBOE (shortness of breath on exertion) 07/23/2020   Diabetes mellitus ([place]) 07/23/2020   Hypertension associated with type 2 diabetes mellitus ([place]) 07/23/2020   Hyperlipidemia associated with type 2 diabetes mellitus ([place]) 07/23/2020   Absolute anemia 07/23/2020   Vitamin D deficiency 07/23/2020   At risk for heart disease 07/23/2020   AKI (acute kidney injury) ([place]) 09/07/2019   Acute kidney injury ([place]) 09/06/2019   Bradycardia 09/06/2019   Hyperglycemia 09/06/2019   HLD (hyperlipidemia)    Hypertensive nephropathy 05/16/2019   Erosive gastritis 11/16/2017   Fatty liver 11/16/2017   Abdominal pain 12/11/2012   Early satiety 12/11/2012   Hiatal hernia    Hx of cholecystectomy 02/05/2011   Flu-like symptoms 02/05/2011   CONSTIPATION 08/27/2008   FATTY LIVER DISEASE 05/07/2008   Nausea with vomiting 04/23/2008   EPIGASTRIC PAIN 04/23/2008   RENAL CALCULUS 04/22/2008   Type 2 diabetes mellitus with stage 2 chronic kidney disease, with long-term current use of insulin ([place]) 05/19/2006   Type 2 diabetes mellitus with hyperlipidemia ([place]) 05/19/2006   HYPERTENSION, BENIGN SYSTEMIC 05/19/2006   GASTROESOPHAGEAL REFLUX, NO ESOPHAGITIS  05/19/2006   AMENORRHEA 05/19/2006      Laureen Abrahams, PT, DPT 02/20/21 10:11 AM     [facility] [address] [place], [place], [address] Phone: [phone]   Fax:  [phone]  Name: Leslie Davis MRN: [id number] Date of Birth: [birth year]

## 2021-02-23 ENCOUNTER — Telehealth: Payer: Self-pay

## 2021-02-23 NOTE — Progress Notes (Signed)
Chronic Care Management Pharmacy Assistant   Name: Leslie Davis  MRN: [id number] DOB: 1955/12/03   Reason for Encounter: Medication Review/ Medication Coordination Call    Recent office visits:   02/05/2021 Gilman Buttner CMA ( orders Only) stopped Azithromycin   02/03/2021 [name] FNP (PCP) started Albuterol Sulfate 108 mcg 2 puffs every 6 hours prn, Oseltamivir Phosphate 75mg  2 times a day, Hydrocodone- Acetaminophen 5-325mg  1 tablet 2 times a day between meals prn    Recent consult visits:   02/20/2021 Faustino Congress PT ( Physical Therapy)- no medication changes noted  02/11/2021 Frankey Shown MD ( Ortho Care ) started Tramadol 50-100 mg daily prn  02/10/2021 [name] Whitmire FNP ( Weight Management) stop albuterol Sulfate 108 mcg, Azithromycin 250 mg, Hydrocodone Acetaminophen 5-325mg .   01/29/2021 Faustino Congress PT ( Physical Therapy) no medication  changes noted  01/27/2021 Faustino Congress PT ( Physical Therapy) no medication  changes noted  01/22/2021 Tiney Rouge PA-C ( Orthopedic Surgery) start Hydrocodone- Acetaminophen 5/325mg  1 tablet daily.  01/22/2021 Deborah Opalski DO (Weight Management) stop Glargine 35 units and start 30 units Subcutaneous Daily at bedtime    Hospital visits:  Medication Reconciliation was completed by comparing discharge summary, patient's EMR and Pharmacy list, and upon discussion with patient.  Admitted to the hospital on 10/27/2020 due to Left total knee arthroplasy. Discharge date was 10/28/2020. Discharged from [facility]?Medications Started at [facility] Hospital Discharge:?None  Medication Changes at Hospital Discharge: None  Medications Discontinued at Hospital Discharge: -Stopped Acetaminophen-codeine 300- 30 mg, Cipro 500 mg, Ibuprofen 200mg   due to Left total knee arthroplasy  Medications that remain the same after Hospital Discharge:??  -All other medications will remain the same.     Medications: Outpatient Encounter Medications as of 02/23/2021  Medication Sig   amLODipine (NORVASC) 10 MG tablet TAKE ONE TABLET BY MOUTH ONCE DAILY   aspirin EC 81 MG tablet Take 1 tablet (81 mg total) by mouth 2 (two) times daily. To be taken after surgery   Azilsartan Medoxomil (EDARBI) 80 MG TABS TAKE 1 TABLET EACH DAY.   Blood Glucose Monitoring Suppl (ONETOUCH VERIO) w/Device KIT Use as directed to check blood sugars 2 times per day dx: e11.65   cetirizine (ZYRTEC) 10 MG tablet Take 1 tablet (10 mg total) by mouth daily.   Cholecalciferol (VITAMIN D3) 1.25 MG (50000 UT) CAPS Take 1 capsule by mouth every 2 wks   dapagliflozin propanediol (FARXIGA) 10 MG TABS tablet Take 1 tablet (10 mg total) by mouth daily before breakfast.   ferrous gluconate (FERGON) 324 MG tablet TAKE 1 TABLET BY MOUTH TWICE DAILY WITH A MEAL. (Patient taking differently: Take 324 mg by mouth 2 (two) times daily with a meal.)   glucose blood (ONETOUCH VERIO) test strip Use as instructed to check blood sugars 2 times per day dx:e11.65   ibuprofen (ADVIL) 800 MG tablet Take 1 tablet (800 mg total) by mouth every 8 (eight) hours as needed.   insulin glargine, 1 Unit Dial, (TOUJEO SOLOSTAR) 300 UNIT/ML Solostar Pen Inject 30 Units into the skin at bedtime.   insulin lispro (HUMALOG KWIKPEN) 100 UNIT/ML KwikPen Sliding scale (Patient taking differently: Inject 25 Units into the skin daily as needed (CBG over 150). Sliding scale)   linaclotide ([name]) 145 MCG CAPS capsule Take 1 capsule (145 mcg total) by mouth daily.   OneTouch Delica Lancets 73X MISC Use as directed to check blood sugars 2 times per day dx:e11.65  pantoprazole (PROTONIX) 40 MG tablet Take one tablet by mouth 3 days a week   rosuvastatin (CRESTOR) 20 MG tablet TAKE ONE TABLET BY MOUTH ONCE DAILY   Semaglutide, 1 MG/DOSE, (OZEMPIC, 1 MG/DOSE,) 4 MG/3ML SOPN Inject 1 mg into the skin once a week. (Patient taking differently: Inject 1 mg into the skin  once a week. Tuesday)   temazepam (RESTORIL) 30 MG capsule TAKE ONE CAPSULE BY MOUTH EVERYDAY AT BEDTIME AS NEEDED   traMADol (ULTRAM) 50 MG tablet Take 1-2 tablets (50-100 mg total) by mouth daily as needed.   TRUEPLUS PEN NEEDLES 31G X 6 MM MISC USE WITH pen TO INJECT insulin DAILY   No facility-administered encounter medications on file as of 02/23/2021.   Reviewed chart for medication changes ahead of medication coordination call.   BP Readings from Last 3 Encounters:  02/10/21 (!) 180/77  02/03/21 (!) 142/87  01/22/21 (!) 154/77    Lab Results  Component Value Date   HGBA1C 6.3 (H) 10/22/2020     Patient obtains medications through [facility]   Other ( Syncing in progress)    Last adherence delivery included:  Docusate sodium 100 mg as needed Temazepam 30 mg as needed Fergon 324 mg twice daily Zyrtec 10 mg daily  Patient declined the following medication last month  Colace patient has plenty until next delivery in December  Patient is due for next adherence delivery on: 03/05/2021. Called patient and reviewed medications and coordinated delivery.  This delivery to include: Trueplus Pen 31g 33mm- Use with pen to inject insulin daily. Rosuvastatin 20 mg- 1 tablet daily Edarbi 80 mg- 1 tablet daily  Amlodipine 10 mg -1 tablet daily Temazepam 30 mg 1 tablet at bedtime as needed  Colace 100 mg 1 capsule by mouth daily as needed   Coordinated acute fill for Farxiga, One touch test strips to be delivered 03/29/2021.  Patient declined the following medications  Vitamin D3- patient receives over the counter Aspirin 81 mg due to being Discontinued  Hydrocodone 5/325 mg- due to being Discontinued Fergon 324 mg  2 times a day- due to filling a 69 DS on 01/27/21 Zyrtec 10 mg  1 tablet daily- due to filling a 69 DS on 01/27/21 Ozempic $RemoveBeforeD'1mg'[id number]$  Inject 1 mg once a week - due to fill 84 DS 01/13/21 Pantoprazole 40 mg  1 tablet 3 days a week- due to fill 84 DS 01/13/21  Patient needs refills  for Tramadol patient is contacting Dr. Erlinda Hong office. Patient notified  of short supply of medication will delivered to sync all meds on 04/07/21. Confirmed delivery date of 03/05/2021, advised patient that pharmacy will contact them the morning of delivery.   Care Gaps:   Shingrix overdue Last Ophthalmology exam 08-01-2020 Last foot exam 11-14-2020 Last flu vaccine 10-20-2020 Covid 19 vaccine overdue last completed 01-31-2020 AWV 12-17-2020   Star Rating Drugs:  Rosuvastatin 20 MG- Last filled 12-01-2020 90 DS [facility] Ozempic 1 MG- Last filled 01-13-2021 84 DS [facility] Azilsartan 80 MG- Last filled 12-01-2020 [facility] Farxiga 10 MG Last filled 12-29-2020 90 [facility]  Note: Called Ortho on 02/24/21 to verify if patient needs to continue Asprin 81 mg daily. Dwana Melena PA-C stated the prescription was only active for 6 weeks after procedure of knee replacement 10/27/2020.   [place] Pharmacist Assistant [phone]

## 2021-02-24 ENCOUNTER — Telehealth: Payer: Self-pay

## 2021-02-24 NOTE — Telephone Encounter (Signed)
[name] with [facility] would like to know if patient needs to continue Aspirin 81mg  and if so, would like a Rx sent to [facility].  Cb# [id number].  Please advise.  Thank you.

## 2021-02-25 NOTE — Telephone Encounter (Signed)
No, only  needed for 6 weeks post-op from our standpoint

## 2021-02-25 NOTE — Telephone Encounter (Signed)
[name] aware

## 2021-02-26 ENCOUNTER — Encounter: Payer: Self-pay | Admitting: Internal Medicine

## 2021-02-26 ENCOUNTER — Other Ambulatory Visit: Payer: Self-pay

## 2021-02-26 ENCOUNTER — Other Ambulatory Visit: Payer: Self-pay | Admitting: Orthopaedic Surgery

## 2021-02-26 ENCOUNTER — Ambulatory Visit (INDEPENDENT_AMBULATORY_CARE_PROVIDER_SITE_OTHER): Payer: Medicare Other | Admitting: Internal Medicine

## 2021-02-26 VITALS — BP 156/82 | HR 56 | Temp 98.6°F | Ht 64.0 in | Wt 183.4 lb

## 2021-02-26 DIAGNOSIS — E6609 Other obesity due to excess calories: Secondary | ICD-10-CM | POA: Diagnosis not present

## 2021-02-26 DIAGNOSIS — E1122 Type 2 diabetes mellitus with diabetic chronic kidney disease: Secondary | ICD-10-CM

## 2021-02-26 DIAGNOSIS — Z6831 Body mass index (BMI) 31.0-31.9, adult: Secondary | ICD-10-CM

## 2021-02-26 DIAGNOSIS — Z23 Encounter for immunization: Secondary | ICD-10-CM

## 2021-02-26 DIAGNOSIS — I129 Hypertensive chronic kidney disease with stage 1 through stage 4 chronic kidney disease, or unspecified chronic kidney disease: Secondary | ICD-10-CM

## 2021-02-26 DIAGNOSIS — E66811 Obesity, class 1: Secondary | ICD-10-CM

## 2021-02-26 DIAGNOSIS — N182 Chronic kidney disease, stage 2 (mild): Secondary | ICD-10-CM | POA: Diagnosis not present

## 2021-02-26 DIAGNOSIS — Z794 Long term (current) use of insulin: Secondary | ICD-10-CM | POA: Diagnosis not present

## 2021-02-26 DIAGNOSIS — Z2821 Immunization not carried out because of patient refusal: Secondary | ICD-10-CM | POA: Diagnosis not present

## 2021-02-26 NOTE — Progress Notes (Signed)
I,[name],acting as a Education administrator for [name], MD.,have documented all relevant documentation on the behalf of [name], MD,as directed by  [name], MD while in the presence of [name], MD.  This visit occured during the SARS-CoV-2 public health emergency.  Safety protocols were in place, including screening questions prior to the visit, additional usage of staff PPE, and extensive cleaning of exam room while observing appropriate contact time as indicated for disinfecting solutions.  Subjective:     Patient ID: Leslie Davis , female    DOB: 1955/11/24 , 65 y.o.   MRN: [id number]   Chief Complaint  Patient presents with   Diabetes   Hypertension    HPI  Patient here for a f/u on her diabetes/htn. At her last visit, she was started Farxiga 41m at her last visit. She has not had any issues with the medication. She reports drinking at least 4 bottles of water daily.   Diabetes She presents for her follow-up diabetic visit. She has type 2 diabetes mellitus. Her disease course has been stable. There are no hypoglycemic associated symptoms. Pertinent negatives for diabetes include no blurred vision, no polydipsia, no polyphagia and no polyuria. There are no hypoglycemic complications. Diabetic complications include nephropathy. Risk factors for coronary artery disease include diabetes mellitus, dyslipidemia, hypertension and post-menopausal. She participates in exercise intermittently. An ACE inhibitor/angiotensin II receptor blocker is being taken. Eye exam is current.  Hypertension This is a chronic problem. The current episode started more than 1 year ago. The problem has been gradually improving since onset. Pertinent negatives include no blurred vision. Risk factors for coronary artery disease include dyslipidemia and diabetes mellitus. The current treatment provides moderate improvement.    Past Medical History:  Diagnosis Date   DM (diabetes  [place]) ([place]    Fatty liver disease, nonalcoholic    Gastritis    Gastroparesis    GERD (gastroesophageal reflux disease)    Heartburn    Hiatal hernia    HLD (hyperlipidemia)    HTN (hypertension)    Joint pain    Renal calculus    Rheumatoid arthritis ([place]    Vitamin D deficiency      Family History  Problem Relation Age of Onset   Diabetes Mother    Thyroid disease Mother    Kidney disease Mother    Diabetes Maternal Grandmother    Stomach cancer Maternal Aunt        X 2 aunts   Hypertension Father    Hyperlipidemia Father    Sudden death Father    Stroke Father    Colon cancer Neg Hx    Esophageal cancer Neg Hx    Rectal cancer Neg Hx      Current Outpatient Medications:    amLODipine (NORVASC) 10 MG tablet, TAKE ONE TABLET BY MOUTH ONCE DAILY, Disp: 90 tablet, Rfl: 3   aspirin EC 81 MG tablet, Take 1 tablet (81 mg total) by mouth 2 (two) times daily. To be taken after surgery, Disp: 84 tablet, Rfl: 0   Azilsartan Medoxomil (EDARBI) 80 MG TABS, TAKE 1 TABLET EACH DAY., Disp: 90 tablet, Rfl: 2   Blood Glucose Monitoring Suppl (ONETOUCH VERIO) w/Device KIT, Use as directed to check blood sugars 2 times per day dx: e11.65, Disp: 1 kit, Rfl: 1   cetirizine (ZYRTEC) 10 MG tablet, Take 1 tablet (10 mg total) by mouth daily., Disp: 90 tablet, Rfl: 2   dapagliflozin propanediol (FARXIGA) 10 MG TABS  tablet, Take 1 tablet (10 mg total) by mouth daily before breakfast., Disp: 90 tablet, Rfl: 1   ferrous gluconate (FERGON) 324 MG tablet, TAKE 1 TABLET BY MOUTH TWICE DAILY WITH A MEAL. (Patient taking differently: Take 324 mg by mouth 2 (two) times daily with a meal.), Disp: 100 tablet, Rfl: 0   glucose blood (ONETOUCH VERIO) test strip, Use as instructed to check blood sugars 2 times per day dx:e11.65, Disp: 150 each, Rfl: 3   ibuprofen (ADVIL) 800 MG tablet, Take 1 tablet (800 mg total) by mouth every 8 (eight) hours as needed., Disp: 30 tablet, Rfl: 2   insulin lispro  (HUMALOG KWIKPEN) 100 UNIT/ML KwikPen, Sliding scale (Patient taking differently: Inject 25 Units into the skin daily as needed (CBG over 150). Sliding scale), Disp: 30 mL, Rfl: 2   linaclotide ([name]) 145 MCG CAPS capsule, Take 1 capsule (145 mcg total) by mouth daily., Disp: 30 capsule, Rfl: 0   OneTouch Delica Lancets 53G MISC, Use as directed to check blood sugars 2 times per day dx:e11.65, Disp: 150 each, Rfl: 3   pantoprazole (PROTONIX) 40 MG tablet, Take one tablet by mouth 3 days a week, Disp: 90 tablet, Rfl: 2   rosuvastatin (CRESTOR) 20 MG tablet, TAKE ONE TABLET BY MOUTH ONCE DAILY, Disp: 90 tablet, Rfl: 2   Semaglutide, 1 MG/DOSE, (OZEMPIC, 1 MG/DOSE,) 4 MG/3ML SOPN, Inject 1 mg into the skin once a week. (Patient taking differently: Inject 1 mg into the skin once a week. Tuesday), Disp: 9 mL, Rfl: 3   temazepam (RESTORIL) 30 MG capsule, TAKE ONE CAPSULE BY MOUTH EVERYDAY AT BEDTIME AS NEEDED, Disp: 60 capsule, Rfl: 2   TRUEPLUS PEN NEEDLES 31G X 6 MM MISC, USE WITH pen TO INJECT insulin DAILY, Disp: 150 each, Rfl: 3   Cholecalciferol (D3-1000) 25 MCG (1000 UT) tablet, Take 1 tablet (1,000 Units total) by mouth daily., Disp: 30 tablet, Rfl: 0   insulin glargine, 1 Unit Dial, (TOUJEO SOLOSTAR) 300 UNIT/ML Solostar Pen, Inject 28 Units into the skin at bedtime., Disp: 15 mL, Rfl: 0   traMADol (ULTRAM) 50 MG tablet, TAKE 1 TO 2 TABLETS BY MOUTH daily AS NEEDED, Disp: 30 tablet, Rfl: 2   Allergies  Allergen Reactions   Fiasp [Insulin Aspart (W-Niacinamide)] Hives     Review of Systems  Constitutional: Negative.   Eyes:  Negative for blurred vision.  Respiratory: Negative.    Cardiovascular: Negative.   Endocrine: Negative for polydipsia, polyphagia and polyuria.  Neurological: Negative.   Psychiatric/Behavioral: Negative.      Today's Vitals   02/26/21 1126  BP: (!) 156/82  Pulse: (!) 56  Temp: 98.6 F (37 C)  Weight: 183 lb 6.4 oz (83.2 kg)  Height: 5' 4" (1.626 m)   PainSc: 0-No pain   Body mass index is 31.48 kg/m.  Wt Readings from Last 3 Encounters:  03/04/21 175 lb (79.4 kg)  02/26/21 183 lb 6.4 oz (83.2 kg)  02/10/21 180 lb (81.6 kg)    BP Readings from Last 3 Encounters:  03/04/21 (!) 150/80  02/26/21 (!) 156/82  02/10/21 (!) 180/77    Objective:  Physical Exam Vitals and nursing note reviewed.  Constitutional:      Appearance: Normal appearance. She is obese.  HENT:     Head: Normocephalic and atraumatic.     Nose:     Comments: Masked     Mouth/Throat:     Comments: Masked  Eyes:     Extraocular Movements: Extraocular  movements intact.  Cardiovascular:     Rate and Rhythm: Normal rate and regular rhythm.     Heart sounds: Normal heart sounds.  Pulmonary:     Effort: Pulmonary effort is normal.     Breath sounds: Normal breath sounds.  Musculoskeletal:     Cervical back: Normal range of motion.  Skin:    General: Skin is warm.  Neurological:     General: No focal deficit present.     Mental Status: She is alert.  Psychiatric:        Mood and Affect: Mood normal.        Behavior: Behavior normal.        Assessment And Plan:     1. Type 2 diabetes mellitus with stage 2 chronic kidney disease, with long-term current use of insulin (HCC) Comments: Chronic, I will check labs as below. I will adjust meds as needed. She will f/u in 3-4 months. - Hemoglobin A1c  2. Hypertensive nephropathy Comments: Uncontrolled. Advised to take azilsartan in AM and change amlodipine to dosing in the evenings.She agrees to rto in 2 weeks for nurse visit. I will adjust meds at nurse visit if needed.  - CMP14+EGFR  3. Class 1 obesity due to excess calories with serious comorbidity and body mass index (BMI) of 31.0 to 31.9 in adult Comments:  She is encouraged to strive for BMI less than 30 to decrease cardiac risk. Advised to aim for at least 150 minutes of exercise per week.   4. Immunization due Comments: She was given high dose flu  vaccine.  - Flu Vaccine QUAD High Dose(Fluad)  5. Herpes zoster vaccination declined   Patient was given opportunity to ask questions. Patient verbalized understanding of the plan and was able to repeat key elements of the plan. All questions were answered to their satisfaction.   I, [name], MD, have reviewed all documentation for this visit. The documentation on 02/26/21 for the exam, diagnosis, procedures, and orders are all accurate and complete.   IF YOU HAVE BEEN REFERRED TO A SPECIALIST, IT MAY TAKE 1-2 WEEKS TO SCHEDULE/PROCESS THE REFERRAL. IF YOU HAVE NOT HEARD FROM US/SPECIALIST IN TWO WEEKS, PLEASE GIVE [place] A CALL AT [phone] X [phone].   THE PATIENT IS ENCOURAGED TO PRACTICE SOCIAL DISTANCING DUE TO THE COVID-19 PANDEMIC.

## 2021-02-26 NOTE — Patient Instructions (Addendum)
Take Edarbi in the AM Take amlodipine in the PM with dinner  Hypertension, Adult Hypertension is another name for high blood pressure. High blood pressure forces your heart to work harder to pump blood. This can cause problems over time. There are two numbers in a blood pressure reading. There is a top number (systolic) over a bottom number (diastolic). It is best to have a blood pressure that is below 120/80. Healthy choices can help lower your blood pressure, or you may need medicine to help lower it. What are the causes? The cause of this condition is not known. Some conditions may be related to high blood pressure. What increases the risk? Smoking. Having type 2 diabetes mellitus, high cholesterol, or both. Not getting enough exercise or physical activity. Being overweight. Having too much fat, sugar, calories, or salt (sodium) in your diet. Drinking too much alcohol. Having long-term (chronic) kidney disease. Having a family history of high blood pressure. Age. Risk increases with age. Race. You may be at higher risk if you are African American. Gender. Men are at higher risk than women before age 55. After age 86, women are at higher risk than men. Having obstructive sleep apnea. Stress. What are the signs or symptoms? High blood pressure may not cause symptoms. Very high blood pressure (hypertensive crisis) may cause: Headache. Feelings of worry or nervousness (anxiety). Shortness of breath. Nosebleed. A feeling of being sick to your stomach (nausea). Throwing up (vomiting). Changes in how you see. Very bad chest pain. Seizures. How is this treated? This condition is treated by making healthy lifestyle changes, such as: Eating healthy foods. Exercising more. Drinking less alcohol. Your health care provider may prescribe medicine if lifestyle changes are not enough to get your blood pressure under control, and if: Your top number is above 130. Your bottom number is above  80. Your personal target blood pressure may vary. Follow these instructions at home: Eating and drinking  If told, follow the DASH eating plan. To follow this plan: Fill one half of your plate at each meal with fruits and vegetables. Fill one fourth of your plate at each meal with whole grains. Whole grains include whole-wheat pasta, brown rice, and whole-grain bread. Eat or drink low-fat dairy products, such as skim milk or low-fat yogurt. Fill one fourth of your plate at each meal with low-fat (lean) proteins. Low-fat proteins include fish, chicken without skin, eggs, beans, and tofu. Avoid fatty meat, cured and processed meat, or chicken with skin. Avoid pre-made or processed food. Eat less than 1,500 mg of salt each day. Do not drink alcohol if: Your doctor tells you not to drink. You are pregnant, may be pregnant, or are planning to become pregnant. If you drink alcohol: Limit how much you use to: 0-1 drink a day for women. 0-2 drinks a day for men. Be aware of how much alcohol is in your drink. In the [place] one drink equals one 12 oz bottle of beer (355 mL), one 5 oz glass of wine (148 mL), or one 1 oz glass of hard liquor (44 mL). Lifestyle  Work with your doctor to stay at a healthy weight or to lose weight. Ask your doctor what the best weight is for you. Get at least 30 minutes of exercise most days of the week. This may include walking, swimming, or biking. Get at least 30 minutes of exercise that strengthens your muscles (resistance exercise) at least 3 days a week. This may include lifting weights or  doing Pilates. Do not use any products that contain nicotine or tobacco, such as cigarettes, e-cigarettes, and chewing tobacco. If you need help quitting, ask your doctor. Check your blood pressure at home as told by your doctor. Keep all follow-up visits as told by your doctor. This is important. Medicines Take over-the-counter and prescription medicines only as told by your  doctor. Follow directions carefully. Do not skip doses of blood pressure medicine. The medicine does not work as well if you skip doses. Skipping doses also puts you at risk for problems. Ask your doctor about side effects or reactions to medicines that you should watch for. Contact a doctor if you: Think you are having a reaction to the medicine you are taking. Have headaches that keep coming back (recurring). Feel dizzy. Have swelling in your ankles. Have trouble with your vision. Get help right away if you: Get a very bad headache. Start to feel mixed up (confused). Feel weak or numb. Feel faint. Have very bad pain in your: Chest. Belly (abdomen). Throw up more than once. Have trouble breathing. Summary Hypertension is another name for high blood pressure. High blood pressure forces your heart to work harder to pump blood. For most people, a normal blood pressure is less than 120/80. Making healthy choices can help lower blood pressure. If your blood pressure does not get lower with healthy choices, you may need to take medicine. This information is not intended to replace advice given to you by your health care provider. Make sure you discuss any questions you have with your health care provider. Document Revised: 11/16/2017 Document Reviewed: 11/16/2017 Elsevier Patient Education  [place].

## 2021-02-27 LAB — CMP14+EGFR
ALT: 17 IU/L (ref 0–32)
AST: 17 IU/L (ref 0–40)
Albumin/Globulin Ratio: 1.4 (ref 1.2–2.2)
Albumin: 4.3 g/dL (ref 3.8–4.8)
Alkaline Phosphatase: 142 IU/L — ABNORMAL HIGH (ref 44–121)
BUN/Creatinine Ratio: 20 (ref 12–28)
BUN: 23 mg/dL (ref 8–27)
Bilirubin Total: <0.2 mg/dL (ref 0.0–1.2)
CO2: 23 mmol/L (ref 20–29)
Calcium: 9.6 mg/dL (ref 8.7–10.3)
Chloride: 102 mmol/L (ref 96–106)
Creatinine, Ser: 1.17 mg/dL — ABNORMAL HIGH (ref 0.57–1.00)
Globulin, Total: 3.1 g/dL (ref 1.5–4.5)
Glucose: 74 mg/dL (ref 70–99)
Potassium: 4.6 mmol/L (ref 3.5–5.2)
Sodium: 142 mmol/L (ref 134–144)
Total Protein: 7.4 g/dL (ref 6.0–8.5)
eGFR: 51 mL/min/{1.73_m2} — ABNORMAL LOW (ref >59)

## 2021-02-27 LAB — HEMOGLOBIN A1C
Est. average glucose Bld gHb Est-mCnc: 128 mg/dL
Hgb A1c MFr Bld: 6.1 % — ABNORMAL HIGH (ref 4.8–5.6)

## 2021-03-02 ENCOUNTER — Telehealth: Payer: Medicare Other

## 2021-03-02 ENCOUNTER — Telehealth: Payer: Self-pay

## 2021-03-02 NOTE — Telephone Encounter (Signed)
  Care Management   Follow Up Note   03/02/2021 Name: Leslie Davis MRN: [id number] DOB: May 06, 1955   Referred by: [name], MD Reason for referral : Chronic Care Management (RN CM Follow up call )   An unsuccessful telephone outreach was attempted today. The patient was referred to the case management team for assistance with care management and care coordination.   Follow Up Plan: A HIPPA compliant phone message was left for the patient providing contact information and requesting a return call.   Barb Merino, RN, BSN, CCM Care Management Coordinator [facility]  Direct Phone: [phone]

## 2021-03-03 ENCOUNTER — Encounter: Payer: Self-pay | Admitting: Physical Therapy

## 2021-03-03 ENCOUNTER — Other Ambulatory Visit: Payer: Self-pay | Admitting: Physician Assistant

## 2021-03-03 ENCOUNTER — Ambulatory Visit (INDEPENDENT_AMBULATORY_CARE_PROVIDER_SITE_OTHER): Payer: Medicare Other | Admitting: Physical Therapy

## 2021-03-03 ENCOUNTER — Telehealth: Payer: Self-pay | Admitting: *Deleted

## 2021-03-03 ENCOUNTER — Other Ambulatory Visit: Payer: Self-pay

## 2021-03-03 DIAGNOSIS — R2689 Other abnormalities of gait and mobility: Secondary | ICD-10-CM | POA: Diagnosis not present

## 2021-03-03 DIAGNOSIS — M25562 Pain in left knee: Secondary | ICD-10-CM | POA: Diagnosis not present

## 2021-03-03 DIAGNOSIS — M25662 Stiffness of left knee, not elsewhere classified: Secondary | ICD-10-CM | POA: Diagnosis not present

## 2021-03-03 DIAGNOSIS — G8929 Other chronic pain: Secondary | ICD-10-CM | POA: Diagnosis not present

## 2021-03-03 DIAGNOSIS — M6281 Muscle weakness (generalized): Secondary | ICD-10-CM

## 2021-03-03 DIAGNOSIS — R600 Localized edema: Secondary | ICD-10-CM | POA: Diagnosis not present

## 2021-03-03 MED ORDER — TRAMADOL HCL 50 MG PO TABS: ORAL_TABLET | ORAL | 2 refills | Status: DC

## 2021-03-03 NOTE — Telephone Encounter (Signed)
Patient called requesting refill of pain medication. Thanks.

## 2021-03-03 NOTE — Therapy (Signed)
[facility] [address] [place], [place], [address] Phone: [phone]   Fax:  [phone]  Physical Therapy Treatment  Patient Details  Name: Leslie Davis MRN: [id number] Date of Birth: February 19, 1956 Referring Provider (PT): Frankey Shown MD   Encounter Date: 03/03/2021   PT End of Session - 03/03/21 1339     Visit Number 25    Number of Visits 32    Date for PT Re-Evaluation 03/20/21    Authorization Type UHC MCR    Progress Note Due on Visit 30    PT Start Time 1300    PT Stop Time 1339    PT Time Calculation (min) 39 min    Activity Tolerance Patient tolerated treatment well    Behavior During Therapy WFL for tasks assessed/performed             Past Medical History:  Diagnosis Date   DM (diabetes mellitus) ([place])    Fatty liver disease, nonalcoholic    Gastritis    Gastroparesis    GERD (gastroesophageal reflux disease)    Heartburn    Hiatal hernia    HLD (hyperlipidemia)    HTN (hypertension)    Joint pain    Renal calculus    Rheumatoid arthritis ([place])    Vitamin D deficiency     Past Surgical History:  Procedure Laterality Date   CHOLECYSTECTOMY     COLONOSCOPY     TOTAL KNEE ARTHROPLASTY Left 10/27/2020   Procedure: LEFT TOTAL KNEE ARTHROPLASTY;  Surgeon: [name], MD;  Location: [place];  Service: Orthopedics;  Laterality: Left;   TUBAL LIGATION     UPPER GASTROINTESTINAL ENDOSCOPY      There were no vitals filed for this visit.   Subjective Assessment - 03/03/21 1301     Subjective knee is hurting today; reports it's tender    Pertinent History DM, HTN    Patient Stated Goals Get back to my old self; no walker    Currently in Pain? Yes    Pain Score 3     Pain Location Knee    Pain Orientation Left    Pain Descriptors / Indicators Aching;Discomfort    Pain Type Surgical pain    Pain Onset More than a month ago    Pain Frequency Intermittent    Aggravating Factors  bending, stiffness    Pain  Relieving Factors meds                [facility] PT Assessment - 03/03/21 1316       Assessment   Medical Diagnosis s/p Lt TKR    Referring Provider (PT) Frankey Shown MD    Onset Date/Surgical Date 10/27/20    Next MD Visit 03/04/21      AROM   Left Knee Extension -5   seated LAQ   Left Knee Flexion 102      PROM   Left Knee Extension 0    Left Knee Flexion 110                           [facility] Adult PT Treatment/Exercise - 03/03/21 1302       Knee/Hip Exercises: Aerobic   Recumbent Bike seat 6; partial revolutions x 8 min      Knee/Hip Exercises: Machines for Strengthening   Cybex Leg Press 56# seat position 1 for 3sets x 10 reps  LLE only; 1 min flexion holds between sets  Knee/Hip Exercises: Seated   Long Arc Quad Left;3 sets;10 reps;Weights    Long Arc Quad Weight 5 lbs.    Long Arc Quad Limitations 5 sec hold                       PT Short Term Goals - 01/14/21 1058       PT SHORT TERM GOAL #1   Title Pt will be I and compliant with HEP.    Time 4    Period Weeks    Status Achieved      PT SHORT TERM GOAL #2   Title Pt able to safely ambulate community distances without AD    Baseline -    Time 4    Period Weeks    Status Achieved               PT Long Term Goals - 02/20/21 4401       PT LONG TERM GOAL #1   Title Pt will improve FOTO to >= 70    Time 4    Period Weeks    Status On-going    Target Date 03/20/21      PT LONG TERM GOAL #2   Title Pt will improve Rt knee  AROM to 3-120 deg to normalize gait and transfers.    Time 4    Period Weeks    Status On-going    Target Date 03/20/21      PT LONG TERM GOAL #3   Title Pt will improve Lt hip/knee strength to overall 4+ to improve function.    Time 4    Period Weeks    Status Achieved      PT LONG TERM GOAL #4   Title Pt will report less than 3-4/10 pain with standing/walking at least 30 minutes and negotiating her 3 steps to enter her house.     Baseline 12/2: up to 4/10 with standing 30 min; 1-2/10 with stairs    Time 4    Period Weeks    Status On-going    Target Date 03/20/21                   Plan - 03/03/21 1339     Clinical Impression Statement Pt's AROM extension slightly improved today and flexion unchanged from prior visit.  Able to get PROM flexion to 110 degrees today but with reports of significant pain that improved following measurement.  Continue to reinforce need for aggressive ROM at home and need to push into some discomfort to maximize ROM.    Personal Factors and Comorbidities Comorbidity 2;Past/Current Experience    Comorbidities DM, HTN    Examination-Activity Limitations Locomotion Level;Transfers    Examination-Participation Restrictions Cleaning;Other   working out at gym   Stability/Clinical Decision Making Stable/Uncomplicated    Rehab Potential Excellent    PT Frequency 2x / week   1-2x/wk   PT Duration 4 weeks    PT Treatment/Interventions ADLs/Self Care Home Management;Aquatic Therapy;Cryotherapy;Electrical Stimulation;Moist Heat;Balance training;Therapeutic exercise;Therapeutic activities;Functional mobility training;Stair training;Gait training;Patient/family education;Manual techniques;Passive range of motion;Scar mobilization;Taping;Vasopneumatic Device    PT Next Visit Plan continue with aggressive ROM and quad strengthening    PT Home Exercise Plan [email or web address]    Consulted and Agree with Plan of Care Patient             Patient will benefit from skilled therapeutic intervention in order to improve the following deficits and impairments:  Abnormal gait, Decreased range of  motion, Pain, Impaired flexibility, Decreased balance, Decreased strength, Increased edema, Postural dysfunction  Visit Diagnosis: Stiffness of left knee, not elsewhere classified  Chronic pain of left knee  Localized edema  Muscle weakness (generalized)  Other abnormalities of gait and  mobility     Problem List Patient Active Problem List   Diagnosis Date Noted   Class 2 severe obesity with serious comorbidity and body mass index (BMI) of 35.0 to 35.9 in adult ([place]) 12/01/2020   Status post total left knee replacement 10/27/2020   Primary osteoarthritis of left knee    NAFLD (nonalcoholic fatty liver disease) 08/06/2020   Other fatigue 07/23/2020   SOBOE (shortness of breath on exertion) 07/23/2020   Diabetes mellitus ([place]) 07/23/2020   Hypertension associated with type 2 diabetes mellitus ([place]) 07/23/2020   Hyperlipidemia associated with type 2 diabetes mellitus ([place]) 07/23/2020   Absolute anemia 07/23/2020   Vitamin D deficiency 07/23/2020   At risk for heart disease 07/23/2020   AKI (acute kidney injury) ([place]) 09/07/2019   Acute kidney injury ([place]) 09/06/2019   Bradycardia 09/06/2019   Hyperglycemia 09/06/2019   HLD (hyperlipidemia)    Hypertensive nephropathy 05/16/2019   Erosive gastritis 11/16/2017   Fatty liver 11/16/2017   Abdominal pain 12/11/2012   Early satiety 12/11/2012   Hiatal hernia    Hx of cholecystectomy 02/05/2011   Flu-like symptoms 02/05/2011   CONSTIPATION 08/27/2008   FATTY LIVER DISEASE 05/07/2008   Nausea with vomiting 04/23/2008   EPIGASTRIC PAIN 04/23/2008   RENAL CALCULUS 04/22/2008   Type 2 diabetes mellitus with stage 2 chronic kidney disease, with long-term current use of insulin ([place]) 05/19/2006   Type 2 diabetes mellitus with hyperlipidemia ([place]) 05/19/2006   HYPERTENSION, BENIGN SYSTEMIC 05/19/2006   GASTROESOPHAGEAL REFLUX, NO ESOPHAGITIS 05/19/2006   AMENORRHEA 05/19/2006        Laureen Abrahams, PT, DPT 03/03/21 1:42 PM     [facility] [address] [place], [place], [address] Phone: [phone]   Fax:  [phone]  Name: Leslie Davis MRN: [id number] Date of Birth: Dec 02, 1955

## 2021-03-03 NOTE — Telephone Encounter (Signed)
Sent in

## 2021-03-04 ENCOUNTER — Ambulatory Visit (INDEPENDENT_AMBULATORY_CARE_PROVIDER_SITE_OTHER): Payer: Medicare Other | Admitting: Orthopaedic Surgery

## 2021-03-04 ENCOUNTER — Ambulatory Visit: Payer: Self-pay

## 2021-03-04 ENCOUNTER — Encounter: Payer: Self-pay | Admitting: Orthopaedic Surgery

## 2021-03-04 ENCOUNTER — Encounter (INDEPENDENT_AMBULATORY_CARE_PROVIDER_SITE_OTHER): Payer: Self-pay | Admitting: Family Medicine

## 2021-03-04 ENCOUNTER — Ambulatory Visit (INDEPENDENT_AMBULATORY_CARE_PROVIDER_SITE_OTHER): Payer: Medicare Other | Admitting: Family Medicine

## 2021-03-04 VITALS — BP 150/80 | HR 56 | Temp 98.1°F | Ht 64.0 in | Wt 175.0 lb

## 2021-03-04 DIAGNOSIS — E1159 Type 2 diabetes mellitus with other circulatory complications: Secondary | ICD-10-CM | POA: Diagnosis not present

## 2021-03-04 DIAGNOSIS — E1169 Type 2 diabetes mellitus with other specified complication: Secondary | ICD-10-CM

## 2021-03-04 DIAGNOSIS — Z794 Long term (current) use of insulin: Secondary | ICD-10-CM | POA: Diagnosis not present

## 2021-03-04 DIAGNOSIS — Z96652 Presence of left artificial knee joint: Secondary | ICD-10-CM | POA: Diagnosis not present

## 2021-03-04 DIAGNOSIS — I152 Hypertension secondary to endocrine disorders: Secondary | ICD-10-CM

## 2021-03-04 DIAGNOSIS — K5900 Constipation, unspecified: Secondary | ICD-10-CM | POA: Diagnosis not present

## 2021-03-04 DIAGNOSIS — Z6835 Body mass index (BMI) 35.0-35.9, adult: Secondary | ICD-10-CM

## 2021-03-04 DIAGNOSIS — Z6830 Body mass index (BMI) 30.0-30.9, adult: Secondary | ICD-10-CM

## 2021-03-04 DIAGNOSIS — E559 Vitamin D deficiency, unspecified: Secondary | ICD-10-CM

## 2021-03-04 MED ORDER — TOUJEO SOLOSTAR 300 UNIT/ML ~~LOC~~ SOPN: 28.0000 [IU] | PEN_INJECTOR | Freq: Every day | SUBCUTANEOUS | 0 refills | Status: DC

## 2021-03-04 MED ORDER — D3-1000 25 MCG (1000 UT) PO TABS: 1000.0000 [IU] | ORAL_TABLET | Freq: Every day | ORAL | 0 refills | Status: DC

## 2021-03-04 NOTE — Progress Notes (Signed)
Post-Op Visit Note   Patient: Leslie Davis           Date of Birth: [birth year]           MRN: [id number] Visit Date: 03/04/2021 PCP: [name], MD   Assessment & Plan:  Chief Complaint:  Chief Complaint  Patient presents with   Left Knee - Pain   Visit Diagnoses:  1. Hx of total knee replacement, left     Plan: Patient is a pleasant 65 year old female who comes in today 4 months out left total knee replacement 10/27/2020.  She has been doing well.  She is still in physical therapy making slow but steady progress.  She is complaining of a deep pain to her knee when she is standing too long or when she flexes her knee.  She is taking tramadol for this.  She does feel as though this pain has improved with time.  Examination of left knee reveals range of motion from 0 to 110 degrees.  She is stable valgus varus stress.  She is neurovascular tact distally.  I have discussed the fact that this can take several more months before her symptoms resolved but that they should continue to improve with time.  I have refilled her tramadol.  She will continue with physical therapy.  Dental prophylaxis reinforced.  Follow-up with [place] in 2 months time for repeat evaluation and 2 view x-rays of the left knee.  Call with concerns or questions.  Follow-Up Instructions: Return in about 2 months (around 05/05/2021).   Orders:  Orders Placed This Encounter  Procedures   XR Knee 1-2 Views Left   No orders of the defined types were placed in this encounter.   Imaging: XR Knee 1-2 Views Left  Result Date: 03/04/2021 Well-seated prosthesis without complication   PMFS History: Patient Active Problem List   Diagnosis Date Noted   Class 2 severe obesity with serious comorbidity and body mass index (BMI) of 35.0 to 35.9 in adult ([place]) 12/01/2020   Status post total left knee replacement 10/27/2020   Primary osteoarthritis of left knee    NAFLD (nonalcoholic fatty liver disease) 08/06/2020    Other fatigue 07/23/2020   SOBOE (shortness of breath on exertion) 07/23/2020   Diabetes mellitus ([place]) 07/23/2020   Hypertension associated with type 2 diabetes mellitus ([place]) 07/23/2020   Hyperlipidemia associated with type 2 diabetes mellitus ([place]) 07/23/2020   Absolute anemia 07/23/2020   Vitamin D deficiency 07/23/2020   At risk for heart disease 07/23/2020   AKI (acute kidney injury) ([place]) 09/07/2019   Acute kidney injury ([place]) 09/06/2019   Bradycardia 09/06/2019   Hyperglycemia 09/06/2019   HLD (hyperlipidemia)    Hypertensive nephropathy 05/16/2019   Erosive gastritis 11/16/2017   Fatty liver 11/16/2017   Abdominal pain 12/11/2012   Early satiety 12/11/2012   Hiatal hernia    Hx of cholecystectomy 02/05/2011   Flu-like symptoms 02/05/2011   CONSTIPATION 08/27/2008   FATTY LIVER DISEASE 05/07/2008   Nausea with vomiting 04/23/2008   EPIGASTRIC PAIN 04/23/2008   RENAL CALCULUS 04/22/2008   Type 2 diabetes mellitus with stage 2 chronic kidney disease, with long-term current use of insulin ([place]) 05/19/2006   Type 2 diabetes mellitus with hyperlipidemia ([place]) 05/19/2006   HYPERTENSION, BENIGN SYSTEMIC 05/19/2006   GASTROESOPHAGEAL REFLUX, NO ESOPHAGITIS 05/19/2006   AMENORRHEA 05/19/2006   Past Medical History:  Diagnosis Date   DM (diabetes mellitus) ([place])    Fatty liver disease, nonalcoholic    Gastritis  Gastroparesis    GERD (gastroesophageal reflux disease)    Heartburn    Hiatal hernia    HLD (hyperlipidemia)    HTN (hypertension)    Joint pain    Renal calculus    Rheumatoid arthritis ([place])    Vitamin D deficiency     Family History  Problem Relation Age of Onset   Diabetes Mother    Thyroid disease Mother    Kidney disease Mother    Diabetes Maternal Grandmother    Stomach cancer Maternal Aunt        X 2 aunts   Hypertension Father    Hyperlipidemia Father    Sudden death Father    Stroke Father    Colon cancer Neg Hx    Esophageal cancer Neg  Hx    Rectal cancer Neg Hx     Past Surgical History:  Procedure Laterality Date   CHOLECYSTECTOMY     COLONOSCOPY     TOTAL KNEE ARTHROPLASTY Left 10/27/2020   Procedure: LEFT TOTAL KNEE ARTHROPLASTY;  Surgeon: [name], MD;  Location: [facility];  Service: Orthopedics;  Laterality: Left;   TUBAL LIGATION     UPPER GASTROINTESTINAL ENDOSCOPY     Social History   Occupational History   Occupation: Housekeeping    Comment: Estate manager/land agent  Tobacco Use   Smoking status: Former    Packs/day: 0.25    Years: 1.00    Pack years: 0.25    Types: Cigarettes    Quit date: 09/10/1974    Years since quitting: 46.5   Smokeless tobacco: Never   Tobacco comments:    she no longer smokes.   Vaping Use   Vaping Use: Never used  Substance and Sexual Activity   Alcohol use: No   Drug use: No   Sexual activity: Not Currently

## 2021-03-05 ENCOUNTER — Other Ambulatory Visit: Payer: Self-pay

## 2021-03-05 ENCOUNTER — Encounter: Payer: Self-pay | Admitting: Physical Therapy

## 2021-03-05 ENCOUNTER — Ambulatory Visit (INDEPENDENT_AMBULATORY_CARE_PROVIDER_SITE_OTHER): Payer: Medicare Other | Admitting: Physical Therapy

## 2021-03-05 DIAGNOSIS — G8929 Other chronic pain: Secondary | ICD-10-CM | POA: Diagnosis not present

## 2021-03-05 DIAGNOSIS — R2689 Other abnormalities of gait and mobility: Secondary | ICD-10-CM

## 2021-03-05 DIAGNOSIS — M25662 Stiffness of left knee, not elsewhere classified: Secondary | ICD-10-CM

## 2021-03-05 DIAGNOSIS — M25562 Pain in left knee: Secondary | ICD-10-CM | POA: Diagnosis not present

## 2021-03-05 DIAGNOSIS — M6281 Muscle weakness (generalized): Secondary | ICD-10-CM

## 2021-03-05 DIAGNOSIS — R600 Localized edema: Secondary | ICD-10-CM | POA: Diagnosis not present

## 2021-03-05 NOTE — Progress Notes (Signed)
Chief Complaint:   OBESITY Leslie Davis is here to discuss her progress with her obesity treatment plan along with follow-up of her obesity related diagnoses. Leslie Davis is on the Category 1 Plan and states she is following her eating plan approximately 90% of the time. Leslie Davis states she is going to the gym 60 minutes 2 times per week.  Today's visit was #: 12 Starting weight: 208 lbs Starting date: 07/23/2020 Today's weight: 175 lbs Today's date: 03/04/2021 Total lbs lost to date: 33 Total lbs lost since last in-office visit: 5  Interim History: Leslie Davis is doing strength training at the gym and the bike for 30 minutes.   Subjective:   1. Type 2 diabetes mellitus with other specified complication, with long-term current use of insulin (Leslie Davis) Discussed labs with patient today. Pt's A1c was originally 10.5. A1c 6 days ago was 6.1! Fasting blood sugars run 70-80's and 2 hours post prandial it is less than 100. Pt reports Ozempic helps with hunger and cravings. She denies side effects.  2. Hypertension associated with type 2 diabetes mellitus (Leslie Davis) Pt's BP runs 140-152/upper 80's. This morning BP was 138/66. Dr. Baird Cancer changed her BP meds last week and pt has seen improvements.   3. Vitamin D deficiency Leslie Davis stopped taking Rx Vit D and has not started OTC Vit D 1,000 IU yet.   4. Acute on chronic constipation, unspecified constipation type Well controlled currently. She is using [name] as needed now and drinking 4 bottles of water per day.  Assessment/Plan:  No orders of the defined types were placed in this encounter.   Medications Discontinued During This Encounter  Medication Reason   Cholecalciferol (VITAMIN D3) 1.25 MG (50000 UT) CAPS    insulin glargine, 1 Unit Dial, (TOUJEO SOLOSTAR) 300 UNIT/ML Solostar Pen Reorder   Cholecalciferol (D3-1000) 25 MCG (1000 UT) tablet Reorder     Meds ordered this encounter  Medications   insulin glargine, 1 Unit Dial, (TOUJEO  SOLOSTAR) 300 UNIT/ML Solostar Pen    Sig: Inject 28 Units into the skin at bedtime.    Dispense:  15 mL    Refill:  0   Cholecalciferol (D3-1000) 25 MCG (1000 UT) tablet    Sig: Take 1 tablet (1,000 Units total) by mouth daily.    Dispense:  30 tablet    Refill:  0     1. Type 2 diabetes mellitus with other specified complication, with long-term current use of insulin (Leslie Davis) Change insulin to 28 units in the evening (down from 30) and continue [place] and Ozempic.  Refill- insulin glargine, 1 Unit Dial, (TOUJEO SOLOSTAR) 300 UNIT/ML Solostar Pen; Inject 28 Units into the skin at bedtime.  Dispense: 15 mL; Refill: 0  2. Hypertension associated with type 2 diabetes mellitus (Leslie Davis) Improving BP. Pt sees PCP again in 2 weeks for BP follow up. Bring in log. Continue to decrease salt, increase exercise, and lose weight.  3. Vitamin D deficiency Discontinue Ergocalciferol. Start OTC Vit D3 1,000 IU daily. Recheck labs in 2 months.  Start- Cholecalciferol (D3-1000) 25 MCG (1000 UT) tablet; Take 1 tablet (1,000 Units total) by mouth daily.  Dispense: 30 tablet; Refill: 0  4. Acute on chronic constipation, unspecified constipation type Continue prudent bowel regimen, increase exercise, and continue adequate hydration.  5. Obesity with current BMI of 30.1  Leslie Davis is currently in the action stage of change. As such, her goal is to continue with weight loss efforts. She has agreed to the Category 1 Plan.  Pt recently had labs done with PCP 6 days ago, therefore we will hold off on obtaining them today.  Decrease insulin to 25-28 units qhs. Continue Farxiga and Ozempic.  Exercise goals:  Increase exercise to 3 days a week.  Behavioral modification strategies: increasing lean protein intake, decreasing simple carbohydrates, and planning for success.  Leslie Davis has agreed to follow-up with our clinic in 3-4 weeks. She was informed of the importance of frequent follow-up visits to maximize her  success with intensive lifestyle modifications for her multiple health conditions.   Objective:   Blood pressure (!) 150/80, pulse (!) 56, temperature 98.1 F (36.7 C), height 5\' 4"  (1.626 m), weight 175 lb (79.4 kg), SpO2 100 %. Body mass index is 30.04 kg/m.  General: Cooperative, alert, well developed, in no acute distress. HEENT: Conjunctivae and lids unremarkable. Cardiovascular: Regular rhythm.  Lungs: Normal work of breathing. Neurologic: No focal deficits.   Lab Results  Component Value Date   CREATININE 1.17 (H) 02/26/2021   BUN 23 02/26/2021   NA 142 02/26/2021   K 4.6 02/26/2021   CL 102 02/26/2021   CO2 23 02/26/2021   Lab Results  Component Value Date   ALT 17 02/26/2021   AST 17 02/26/2021   ALKPHOS 142 (H) 02/26/2021   BILITOT <0.2 02/26/2021   Lab Results  Component Value Date   HGBA1C 6.1 (H) 02/26/2021   HGBA1C 6.3 (H) 10/22/2020   HGBA1C 6.8 (H) 08/25/2020   HGBA1C 8.7 (H) 05/28/2020   HGBA1C 8.8 (H) 02/21/2020   No results found for: INSULIN Lab Results  Component Value Date   TSH 1.790 07/23/2020   Lab Results  Component Value Date   CHOL 156 11/20/2020   HDL 61 11/20/2020   LDLCALC 78 11/20/2020   TRIG 89 11/20/2020   CHOLHDL 2.6 11/20/2020   Lab Results  Component Value Date   VD25OH 102.0 (H) 11/20/2020   VD25OH 95.2 10/22/2020   VD25OH 112.0 (H) 07/23/2020   Lab Results  Component Value Date   WBC 5.1 12/23/2020   HGB 11.2 (L) 12/23/2020   HCT 34.9 (L) 12/23/2020   MCV 85.1 12/23/2020   PLT 256.0 12/23/2020   Lab Results  Component Value Date   IRON 68 11/16/2017   TIBC 355 11/16/2017   FERRITIN 45.2 11/16/2017    Obesity Behavioral Intervention:   Approximately 15 minutes were spent on the discussion below.  ASK: We discussed the diagnosis of obesity with Leslie Davis today and Leslie Davis agreed to give [place] permission to discuss obesity behavioral modification therapy today.  ASSESS: Leslie Davis has the diagnosis of obesity  and her BMI today is 30.1. Shanitra is in the action stage of change.   ADVISE: Leslie Davis was educated on the multiple health risks of obesity as well as the benefit of weight loss to improve her health. She was advised of the need for long term treatment and the importance of lifestyle modifications to improve her current health and to decrease her risk of future health problems.  AGREE: Multiple dietary modification options and treatment options were discussed and Leslie Davis agreed to follow the recommendations documented in the above note.  ARRANGE: Leslie Davis was educated on the importance of frequent visits to treat obesity as outlined per CMS and USPSTF guidelines and agreed to schedule her next follow up appointment today.  Attestation Statements:   Reviewed by clinician on day of visit: allergies, medications, problem list, medical history, surgical history, family history, social history, and previous encounter notes.  I,  Kathlene November, CMA, am acting as transcriptionist for [employer], DO.  I have reviewed the above documentation for accuracy and completeness, and I agree with the above. Leslie Davis, D.O.  The [employer] was signed into law in 2016 which includes the topic of electronic health records.  This provides immediate access to information in MyChart.  This includes consultation notes, operative notes, office notes, lab results and pathology reports.  If you have any questions about what you read please let us know at your next visit so we can discuss your concerns and take corrective action if need be.  We are right here with you.

## 2021-03-05 NOTE — Therapy (Signed)
[facility] Physical Therapy [address] [place], [place], [address] Phone: [phone]   Fax:  [phone]  Physical Therapy Treatment  Patient Details  Name: Leslie Davis MRN: [id number] Date of Birth: 1955/07/04 Referring Provider (PT): Frankey Shown MD   Encounter Date: 03/05/2021   PT End of Session - 03/05/21 1228     Visit Number 26    Number of Visits 74    Date for PT Re-Evaluation 03/20/21    Authorization Type UHC MCR    Progress Note Due on Visit 39    PT Start Time 1139    PT Stop Time 1219    PT Time Calculation (min) 40 min    Activity Tolerance Patient tolerated treatment well    Behavior During Therapy WFL for tasks assessed/performed             Past Medical History:  Diagnosis Date   DM (diabetes mellitus) ([place])    Fatty liver disease, nonalcoholic    Gastritis    Gastroparesis    GERD (gastroesophageal reflux disease)    Heartburn    Hiatal hernia    HLD (hyperlipidemia)    HTN (hypertension)    Joint pain    Renal calculus    Rheumatoid arthritis ([place])    Vitamin D deficiency     Past Surgical History:  Procedure Laterality Date   CHOLECYSTECTOMY     COLONOSCOPY     TOTAL KNEE ARTHROPLASTY Left 10/27/2020   Procedure: LEFT TOTAL KNEE ARTHROPLASTY;  Surgeon: [name], MD;  Location: [facility];  Service: Orthopedics;  Laterality: Left;   TUBAL LIGATION     UPPER GASTROINTESTINAL ENDOSCOPY      There were no vitals filed for this visit.   Subjective Assessment - 03/05/21 1140     Subjective doing okay, overall MD visit went well.  going to gym 2-3x/wk    Pertinent History DM, HTN    Patient Stated Goals Get back to my old self; no walker    Currently in Pain? Yes    Pain Score 0-No pain    Pain Location Knee    Pain Orientation Left    Pain Descriptors / Indicators Aching;Discomfort    Pain Type Surgical pain    Pain Onset More than a month ago    Pain Frequency Intermittent    Aggravating Factors   bending, stiffness    Pain Relieving Factors meds                               [facility] Adult PT Treatment/Exercise - 03/05/21 1145       Knee/Hip Exercises: Aerobic   Nustep L5 x 8 min; seat 8      Knee/Hip Exercises: Machines for Strengthening   Cybex Knee Extension 5 lbs, bil to LLE only concentric;  LLE only ecentric; 3x10reps    Cybex Knee Flexion 10# LLE only 3x10    Cybex Leg Press 50# seat position 1 for 3sets x 10 reps  LLE only; 1 min flexion holds between sets      Knee/Hip Exercises: Standing   Step Down Limitations LLE step down from 4" step; RLE heel tap 3x10; mod cues needed    SLS SLS on LLE while tennis ball rolls RLE 10 reps slow & 15 reps fast for foreward/back, med/lat & circles.    Other Standing Knee Exercises RDL 10# KB 3x10 reps  PT Short Term Goals - 01/14/21 1058       PT SHORT TERM GOAL #1   Title Pt will be I and compliant with HEP.    Time 4    Period Weeks    Status Achieved      PT SHORT TERM GOAL #2   Title Pt able to safely ambulate community distances without AD    Baseline -    Time 4    Period Weeks    Status Achieved               PT Long Term Goals - 02/20/21 7622       PT LONG TERM GOAL #1   Title Pt will improve FOTO to >= 70    Time 4    Period Weeks    Status On-going    Target Date 03/20/21      PT LONG TERM GOAL #2   Title Pt will improve Rt knee  AROM to 3-120 deg to normalize gait and transfers.    Time 4    Period Weeks    Status On-going    Target Date 03/20/21      PT LONG TERM GOAL #3   Title Pt will improve Lt hip/knee strength to overall 4+ to improve function.    Time 4    Period Weeks    Status Achieved      PT LONG TERM GOAL #4   Title Pt will report less than 3-4/10 pain with standing/walking at least 30 minutes and negotiating her 3 steps to enter her house.    Baseline 12/2: up to 4/10 with standing 30 min; 1-2/10 with stairs    Time 4     Period Weeks    Status On-going    Target Date 03/20/21                   Plan - 03/05/21 1228     Clinical Impression Statement Pt tolerated session well today and discussed transition to full community based program next week.  Pt in agreement to begin d/c next week and transition to full community exercise.    Personal Factors and Comorbidities Comorbidity 2;Past/Current Experience    Comorbidities DM, HTN    Examination-Activity Limitations Locomotion Level;Transfers    Examination-Participation Restrictions Cleaning;Other   working out at gym   Stability/Clinical Decision Making Stable/Uncomplicated    Rehab Potential Excellent    PT Frequency 2x / week   1-2x/wk   PT Duration 4 weeks    PT Treatment/Interventions ADLs/Self Care Home Management;Aquatic Therapy;Cryotherapy;Electrical Stimulation;Moist Heat;Balance training;Therapeutic exercise;Therapeutic activities;Functional mobility training;Stair training;Gait training;Patient/family education;Manual techniques;Passive range of motion;Scar mobilization;Taping;Vasopneumatic Device    PT Next Visit Plan continue with aggressive ROM and quad strengthening; begin d/c planning - provide gym program    PT [place] and Agree with Plan of Care Patient             Patient will benefit from skilled therapeutic intervention in order to improve the following deficits and impairments:  Abnormal gait, Decreased range of motion, Pain, Impaired flexibility, Decreased balance, Decreased strength, Increased edema, Postural dysfunction  Visit Diagnosis: Stiffness of left knee, not elsewhere classified  Chronic pain of left knee  Localized edema  Muscle weakness (generalized)  Other abnormalities of gait and mobility     Problem List Patient Active Problem List   Diagnosis Date Noted   Class 2 severe obesity with serious comorbidity and  body mass index (BMI) of 35.0 to 35.9 in adult [facility])  12/01/2020   Status post total left knee replacement 10/27/2020   Primary osteoarthritis of left knee    NAFLD (nonalcoholic fatty liver disease) 08/06/2020   Other fatigue 07/23/2020   SOBOE (shortness of breath on exertion) 07/23/2020   Diabetes mellitus ([place]) 07/23/2020   Hypertension associated with type 2 diabetes mellitus ([place]) 07/23/2020   Hyperlipidemia associated with type 2 diabetes mellitus ([place]) 07/23/2020   Absolute anemia 07/23/2020   Vitamin D deficiency 07/23/2020   At risk for heart disease 07/23/2020   AKI (acute kidney injury) ([place]) 09/07/2019   Acute kidney injury ([place]) 09/06/2019   Bradycardia 09/06/2019   Hyperglycemia 09/06/2019   HLD (hyperlipidemia)    Hypertensive nephropathy 05/16/2019   Erosive gastritis 11/16/2017   Fatty liver 11/16/2017   Abdominal pain 12/11/2012   Early satiety 12/11/2012   Hiatal hernia    Hx of cholecystectomy 02/05/2011   Flu-like symptoms 02/05/2011   CONSTIPATION 08/27/2008   FATTY LIVER DISEASE 05/07/2008   Nausea with vomiting 04/23/2008   EPIGASTRIC PAIN 04/23/2008   RENAL CALCULUS 04/22/2008   Type 2 diabetes mellitus with stage 2 chronic kidney disease, with long-term current use of insulin ([place]) 05/19/2006   Type 2 diabetes mellitus with hyperlipidemia ([place]) 05/19/2006   HYPERTENSION, BENIGN SYSTEMIC 05/19/2006   GASTROESOPHAGEAL REFLUX, NO ESOPHAGITIS 05/19/2006   AMENORRHEA 05/19/2006        Laureen Abrahams, PT, DPT 03/05/21 12:31 PM     [facility] [address] [place], [place], [address] Phone: [phone]   Fax:  [phone]  Name: Leslie Davis MRN: [id number] Date of Birth: [birth year]

## 2021-03-10 ENCOUNTER — Ambulatory Visit: Payer: Medicare Other

## 2021-03-11 ENCOUNTER — Encounter: Payer: Self-pay | Admitting: Physical Therapy

## 2021-03-11 ENCOUNTER — Other Ambulatory Visit: Payer: Self-pay

## 2021-03-11 ENCOUNTER — Ambulatory Visit (INDEPENDENT_AMBULATORY_CARE_PROVIDER_SITE_OTHER): Payer: Medicare Other | Admitting: Physical Therapy

## 2021-03-11 DIAGNOSIS — M6281 Muscle weakness (generalized): Secondary | ICD-10-CM

## 2021-03-11 DIAGNOSIS — M25562 Pain in left knee: Secondary | ICD-10-CM

## 2021-03-11 DIAGNOSIS — R2689 Other abnormalities of gait and mobility: Secondary | ICD-10-CM | POA: Diagnosis not present

## 2021-03-11 DIAGNOSIS — G8929 Other chronic pain: Secondary | ICD-10-CM | POA: Diagnosis not present

## 2021-03-11 DIAGNOSIS — M25662 Stiffness of left knee, not elsewhere classified: Secondary | ICD-10-CM

## 2021-03-11 DIAGNOSIS — R600 Localized edema: Secondary | ICD-10-CM

## 2021-03-11 NOTE — Therapy (Signed)
[facility] [address] [place], [place], [address] Phone: [phone]   Fax:  [phone]  Physical Therapy Treatment/Discharge Summary  Patient Details  Name: Leslie Davis MRN: [id number] Date of Birth: May 11, 1955 Referring Provider (PT): Frankey Shown MD   Encounter Date: 03/11/2021   PT End of Session - 03/11/21 1158     Visit Number 27    Authorization Type UHC MCR    Progress Note Due on Visit 3    PT Start Time 1133    PT Stop Time 1213    PT Time Calculation (min) 40 min    Activity Tolerance Patient tolerated treatment well    Behavior During Therapy WFL for tasks assessed/performed             Past Medical History:  Diagnosis Date   DM (diabetes mellitus) ([place])    Fatty liver disease, nonalcoholic    Gastritis    Gastroparesis    GERD (gastroesophageal reflux disease)    Heartburn    Hiatal hernia    HLD (hyperlipidemia)    HTN (hypertension)    Joint pain    Renal calculus    Rheumatoid arthritis ([place])    Vitamin D deficiency     Past Surgical History:  Procedure Laterality Date   CHOLECYSTECTOMY     COLONOSCOPY     TOTAL KNEE ARTHROPLASTY Left 10/27/2020   Procedure: LEFT TOTAL KNEE ARTHROPLASTY;  Surgeon: [name], MD;  Location: [place];  Service: Orthopedics;  Laterality: Left;   TUBAL LIGATION     UPPER GASTROINTESTINAL ENDOSCOPY      There were no vitals filed for this visit.   Subjective Assessment - 03/11/21 1135     Subjective knee is doing well; hurts every now and then but not bad    Pertinent History DM, HTN    Patient Stated Goals Get back to my old self; no walker    Currently in Pain? No/denies                [facility] PT Assessment - 03/11/21 1149       Assessment   Medical Diagnosis s/p Lt TKR    Referring Provider (PT) Frankey Shown MD    Onset Date/Surgical Date 10/27/20      Observation/Other Assessments   Focus on Therapeutic Outcomes (FOTO)  69      AROM   Left  Knee Extension -2   seated LAQ   Left Knee Flexion 103      PROM   Left Knee Extension 0    Left Knee Flexion 110                           [facility] Adult PT Treatment/Exercise - 03/11/21 1133       Knee/Hip Exercises: Aerobic   Nustep L6 x 8 min; seat 9      Knee/Hip Exercises: Machines for Strengthening   Cybex Knee Extension 5 lbs, bil to LLE only concentric;  LLE only ecentric; 3x10reps    Cybex Knee Flexion 10# LLE only 3x10      Knee/Hip Exercises: Supine   Heel Slides AAROM;Left;10 reps    Heel Slides Limitations 10 sec holds                       PT Short Term Goals - 01/14/21 1058       PT SHORT TERM GOAL #1   Title Pt  will be I and compliant with HEP.    Time 4    Period Weeks    Status Achieved      PT SHORT TERM GOAL #2   Title Pt able to safely ambulate community distances without AD    Baseline -    Time 4    Period Weeks    Status Achieved               PT Long Term Goals - 03/11/21 1159       PT LONG TERM GOAL #1   Title Pt will improve FOTO to >= 70    Baseline 12/21: met FOTO prediction of 69, did not meet goal of 70    Time 4    Period Weeks    Status Partially Met    Target Date 03/20/21      PT LONG TERM GOAL #2   Title Pt will improve Rt knee  AROM to 3-120 deg to normalize gait and transfers.    Baseline 12/21: 0-2-    Time 4    Period Weeks    Status Partially Met    Target Date 03/20/21      PT LONG TERM GOAL #3   Title Pt will improve Lt hip/knee strength to overall 4+ to improve function.    Time 4    Period Weeks    Status Achieved      PT LONG TERM GOAL #4   Title Pt will report less than 3-4/10 pain with standing/walking at least 30 minutes and negotiating her 3 steps to enter her house.    Time 4    Period Weeks    Status Achieved    Target Date 03/20/21                   Plan - 03/11/21 1223     Clinical Impression Statement Pt has met/partially met all goals and is  ready for d/c from PT.  Will d/c PT today.    Personal Factors and Comorbidities Comorbidity 2;Past/Current Experience    Comorbidities DM, HTN    Examination-Activity Limitations Locomotion Level;Transfers    Examination-Participation Restrictions Cleaning;Other   working out at gym   Stability/Clinical Decision Making Stable/Uncomplicated    Rehab Potential Excellent    PT Frequency 2x / week   1-2x/wk   PT Duration 4 weeks    PT Treatment/Interventions ADLs/Self Care Home Management;Aquatic Therapy;Cryotherapy;Electrical Stimulation;Moist Heat;Balance training;Therapeutic exercise;Therapeutic activities;Functional mobility training;Stair training;Gait training;Patient/family education;Manual techniques;Passive range of motion;Scar mobilization;Taping;Vasopneumatic Device    PT Next Visit Plan d/c PT today    PT Home Exercise Plan [email or web address]    Consulted and Agree with Plan of Care Patient             Patient will benefit from skilled therapeutic intervention in order to improve the following deficits and impairments:  Abnormal gait, Decreased range of motion, Pain, Impaired flexibility, Decreased balance, Decreased strength, Increased edema, Postural dysfunction  Visit Diagnosis: Stiffness of left knee, not elsewhere classified  Chronic pain of left knee  Localized edema  Muscle weakness (generalized)  Other abnormalities of gait and mobility     Problem List Patient Active Problem List   Diagnosis Date Noted   Class 2 severe obesity with serious comorbidity and body mass index (BMI) of 35.0 to 35.9 in adult ([place]) 12/01/2020   Status post total left knee replacement 10/27/2020   Primary osteoarthritis of left knee    NAFLD (nonalcoholic fatty liver disease)  08/06/2020   Other fatigue 07/23/2020   SOBOE (shortness of breath on exertion) 07/23/2020   Diabetes mellitus ([place]) 07/23/2020   Hypertension associated with type 2 diabetes mellitus ([place]) 07/23/2020    Hyperlipidemia associated with type 2 diabetes mellitus ([place]) 07/23/2020   Absolute anemia 07/23/2020   Vitamin D deficiency 07/23/2020   At risk for heart disease 07/23/2020   AKI (acute kidney injury) ([place]) 09/07/2019   Acute kidney injury ([place]) 09/06/2019   Bradycardia 09/06/2019   Hyperglycemia 09/06/2019   HLD (hyperlipidemia)    Hypertensive nephropathy 05/16/2019   Erosive gastritis 11/16/2017   Fatty liver 11/16/2017   Abdominal pain 12/11/2012   Early satiety 12/11/2012   Hiatal hernia    Hx of cholecystectomy 02/05/2011   Flu-like symptoms 02/05/2011   CONSTIPATION 08/27/2008   FATTY LIVER DISEASE 05/07/2008   Nausea with vomiting 04/23/2008   EPIGASTRIC PAIN 04/23/2008   RENAL CALCULUS 04/22/2008   Type 2 diabetes mellitus with stage 2 chronic kidney disease, with long-term current use of insulin ([place]) 05/19/2006   Type 2 diabetes mellitus with hyperlipidemia ([place]) 05/19/2006   HYPERTENSION, BENIGN SYSTEMIC 05/19/2006   GASTROESOPHAGEAL REFLUX, NO ESOPHAGITIS 05/19/2006   AMENORRHEA 05/19/2006     Laureen Abrahams, PT, DPT 03/11/21 12:24 PM    [facility] [address] [place], [place], [address] Phone: [phone]   Fax:  [phone]  Name: Leslie Davis MRN: [id number] Date of Birth: [birth year]   PHYSICAL THERAPY DISCHARGE SUMMARY  Visits from Start of Care: 27  Current functional level related to goals / functional outcomes: See above   Remaining deficits: See above   Education / Equipment: HEP   Patient agrees to discharge. Patient goals were partially met. Patient is being discharged due to being pleased with the current functional level.  Laureen Abrahams, PT, DPT 03/11/21 12:25 PM  [facility] [address] [place], [place], [address] Phone: [phone]   Fax:  [phone]

## 2021-03-11 NOTE — Patient Instructions (Signed)
Access Code: [email or web address] URL: [email or web address] Date: 03/11/2021 Prepared by: Faustino Congress  Exercises Supine Quad Set - 3 x daily - 7 x weekly - 1 sets - 10 reps - 10 hold Seated Knee Flexion Extension AROM - 3 x daily - 7 x weekly - 1 sets - 10 reps Sit to Stand - 1 x daily - 7 x weekly - 3 sets - 10 reps Seated Straight Leg Heel Taps - 1 x daily - 7 x weekly - 3 sets - 10 reps Supine Heel Slide (Mirrored) - 2 x daily - 7 x weekly - 3 sets - 10 reps - 2 hold Step Up - 1 x daily - 7 x weekly - 3 sets - 10 reps Lateral Step Up - 1 x daily - 7 x weekly - 3 sets - 10 reps Mini Squat with Counter Support - 1 x daily - 7 x weekly - 3 sets - 10 reps Kettlebell Deadlift - 1 x daily - 7 x weekly - 3 sets - 10 reps Upright Bike - 1 x daily - 7 x weekly - 10 reps - 3 sets Full Leg Press - 1 x daily - 7 x weekly - 10 reps - 3 sets Heel Raises with Leg Press - 1 x daily - 7 x weekly - 10 reps - 3 sets Seated Hip Abduction with Resistance - 1 x daily - 7 x weekly - 10 reps - 3 sets Seated Hip Adduction Squeeze with Ball - 1 x daily - 7 x weekly - 10 reps - 3 sets Eccentric Hamstring Curl with Weight Machine - 1 x daily - 7 x weekly - 10 reps - 3 sets Single Leg Knee Extension with Weight Machine - 1 x daily - 7 x weekly - 10 reps - 3 sets

## 2021-03-13 ENCOUNTER — Encounter: Payer: Medicare Other | Admitting: Physical Therapy

## 2021-03-17 ENCOUNTER — Encounter: Payer: Medicare Other | Admitting: Physical Therapy

## 2021-03-17 ENCOUNTER — Ambulatory Visit: Payer: Medicare Other

## 2021-03-19 ENCOUNTER — Other Ambulatory Visit (INDEPENDENT_AMBULATORY_CARE_PROVIDER_SITE_OTHER): Payer: Self-pay | Admitting: Family Medicine

## 2021-03-19 ENCOUNTER — Encounter: Payer: Medicare Other | Admitting: Physical Therapy

## 2021-03-19 DIAGNOSIS — K5900 Constipation, unspecified: Secondary | ICD-10-CM

## 2021-03-24 ENCOUNTER — Telehealth: Payer: Self-pay

## 2021-03-24 ENCOUNTER — Encounter: Payer: Medicare Other | Admitting: Physical Therapy

## 2021-03-24 NOTE — Telephone Encounter (Signed)
Dr.Opalski ?

## 2021-03-24 NOTE — Progress Notes (Signed)
Chronic Care Management Pharmacy Assistant   Name: Leslie Davis  MRN: [id number] DOB: [birth year]    Reason for Encounter: Medication Review/ Medication Coordination Call    Recent office visits:   03/02/2021 Barb Merino RN (CCM)  02/26/2021 [name] MD ( PCP)- medication changes noted   Recent consult visits:   03/11/2021 Faustino Congress PT [facility]- no medication changes noted  03/05/2021 Faustino Congress PT [facility]- no medication changes noted  03/04/2021 [name] [facility] ( [facility])- no mediation changes noted  03/04/2021 Neoma Laming Opalski DO (Weight Management) change Cholecalciferol 1,000 units oral daily  03/03/2021 Faustino Congress PT [facility]- no medication changes noted  Hospital visits:  Medication Reconciliation was completed by comparing discharge summary, patients EMR and Pharmacy list, and upon discussion with patient.   Admitted to the hospital on 10/27/2020 due to Left total knee arthroplasy. Discharge date was 10/28/2020. Discharged from [facility]?Medications Started at [facility] Discharge:?None   Medication Changes at Hospital Discharge: None   Medications Discontinued at Hospital Discharge: -Stopped Acetaminophen-codeine 300- 30 mg, Cipro 500 mg, Ibuprofen 280m  due to Left total knee arthroplasy   Medications that remain the same after Hospital Discharge:??  -All other medications will remain the same.      Medications: Outpatient Encounter Medications as of 03/24/2021  Medication Sig   amLODipine (NORVASC) 10 MG tablet TAKE ONE TABLET BY MOUTH ONCE DAILY   aspirin EC 81 MG tablet Take 1 tablet (81 mg total) by mouth 2 (two) times daily. To be taken after surgery   Azilsartan Medoxomil (EDARBI) 80 MG TABS TAKE 1 TABLET EACH DAY.   Blood Glucose Monitoring Suppl (ONETOUCH VERIO) w/Device KIT Use as directed to check blood  sugars 2 times per day dx: e11.65   cetirizine (ZYRTEC) 10 MG tablet Take 1 tablet (10 mg total) by mouth daily.   Cholecalciferol (D3-1000) 25 MCG (1000 UT) tablet Take 1 tablet (1,000 Units total) by mouth daily.   dapagliflozin propanediol (FARXIGA) 10 MG TABS tablet Take 1 tablet (10 mg total) by mouth daily before breakfast.   ferrous gluconate (FERGON) 324 MG tablet TAKE 1 TABLET BY MOUTH TWICE DAILY WITH A MEAL. (Patient taking differently: Take 324 mg by mouth 2 (two) times daily with a meal.)   glucose blood (ONETOUCH VERIO) test strip Use as instructed to check blood sugars 2 times per day dx:e11.65   ibuprofen (ADVIL) 800 MG tablet Take 1 tablet (800 mg total) by mouth every 8 (eight) hours as needed.   insulin glargine, 1 Unit Dial, (TOUJEO SOLOSTAR) 300 UNIT/ML Solostar Pen Inject 28 Units into the skin at bedtime.   insulin lispro (HUMALOG KWIKPEN) 100 UNIT/ML KwikPen Sliding scale (Patient taking differently: Inject 25 Units into the skin daily as needed (CBG over 150). Sliding scale)   linaclotide ([name]) 145 MCG CAPS capsule Take 1 capsule (145 mcg total) by mouth daily.   OneTouch Delica Lancets 393ZMISC Use as directed to check blood sugars 2 times per day dx:e11.65   pantoprazole (PROTONIX) 40 MG tablet Take one tablet by mouth 3 days a week   rosuvastatin (CRESTOR) 20 MG tablet TAKE ONE TABLET BY MOUTH ONCE DAILY   Semaglutide, 1 MG/DOSE, (OZEMPIC, 1 MG/DOSE,) 4 MG/3ML SOPN Inject 1 mg into the skin once a week. (Patient taking differently: Inject 1 mg into the skin once a week. Tuesday)   temazepam (RESTORIL) 30 MG capsule TAKE  ONE CAPSULE BY MOUTH EVERYDAY AT BEDTIME AS NEEDED   traMADol (ULTRAM) 50 MG tablet TAKE 1 TO 2 TABLETS BY MOUTH daily AS NEEDED   TRUEPLUS PEN NEEDLES 31G X 6 MM MISC USE WITH pen TO INJECT insulin DAILY   No facility-administered encounter medications on file as of 03/24/2021.   Reviewed chart for medication changes ahead of medication coordination  call.   No medication changes indicated OR if recent visit, treatment plan here.  BP Readings from Last 3 Encounters:  03/04/21 (!) 150/80  02/26/21 (!) 156/82  02/10/21 (!) 180/77    Lab Results  Component Value Date   HGBA1C 6.1 (H) 02/26/2021     Patient obtains medications through Vials  90 Days   Last adherence delivery included: Trueplus Pen 31g 79m- Use with pen to inject insulin daily. Rosuvastatin 20 mg- 1 tablet daily Edarbi 80 mg- 1 tablet daily  Amlodipine 10 mg -1 tablet daily Temazepam 30 mg 1 tablet at bedtime as needed  Colace 100 mg 1 capsule by mouth daily as needed    Patient did not declined any medication.    Explanation of abundance on hand: None  Patient is due for next adherence delivery on: 04/03/2021.  This delivery to include:  Rosuvastatin 20 mg- 1 tablet daily Amlodipine 10 mg -1 tablet daily Edarbi 80 mg- 1 tablet daily  Fergon 324 mg  2 times a day Zyrtec 10 mg  1 tablet daily Temazepam 30 mg 1 tablet at bedtime as needed  Colace 100 mg 1 capsule by mouth daily as needed Pantoprazole 40 mg  1 tablet 3 days a week Ozempic 192mInject 1 mg once a week  Farxiga 1044m tablet daily One touch verio strips use twice daily as directed Trazodone 38m63m2 tablets daily as needed   Patient will not need a short fill/ acute fill    Patient declined the following medications  Vitamin D3- patient receives over the counter Aspirin 81 mg due to being Discontinued  Hydrocodone 5/325 mg- due to being Discontinued   Called patient and reviewed medications and coordinated delivery but patient did not answer. The pharmacy will call before delivery.  Called patient 03/24/2021 left VM Called patient 03/25/2021 left VM Called patient 03/26/2021 left VM  Care Gaps:  Shingrix overdue Last Ophthalmology exam 08-01-2020 Last foot exam 11-14-2020 Last flu vaccine 10-20-2020 Covid 19 vaccine overdue last completed 01-31-2020 AWV 12-17-2020    Star  Rating Drugs:  Rosuvastatin 20 MG     09/12, 12/8  90/39 DS  Ozempic 1 MG              06/15,10/25     84 DS  Azilsartan 80 MG             09/12, 12/08   90/39  DS  Farxiga 10 MG                  9/05, 10/10 30/90 DS    AdreCherlyn Labellanical Pharmacist Assistant [phone]

## 2021-03-25 ENCOUNTER — Other Ambulatory Visit: Payer: Self-pay | Admitting: Physician Assistant

## 2021-03-25 ENCOUNTER — Telehealth: Payer: Self-pay | Admitting: Orthopaedic Surgery

## 2021-03-25 MED ORDER — TRAMADOL HCL 50 MG PO TABS: ORAL_TABLET | ORAL | 2 refills | Status: DC

## 2021-03-25 MED ORDER — IBUPROFEN 800 MG PO TABS: 800.0000 mg | ORAL_TABLET | Freq: Three times a day (TID) | ORAL | 2 refills | Status: DC | PRN

## 2021-03-25 NOTE — Telephone Encounter (Signed)
Pt calling to get medication ibuprofen 800 mg and tramadol refilled. The best pharmacy is [facility] - [place], [place] - [address] and the best call back number is [phone].

## 2021-03-25 NOTE — Telephone Encounter (Signed)
Sent in

## 2021-03-25 NOTE — Telephone Encounter (Signed)
Called patient no answer LMOM. Rx sent into pharm

## 2021-03-27 ENCOUNTER — Encounter: Payer: Medicare Other | Admitting: Physical Therapy

## 2021-03-30 ENCOUNTER — Other Ambulatory Visit: Payer: Self-pay | Admitting: Physician Assistant

## 2021-03-30 ENCOUNTER — Other Ambulatory Visit: Payer: Self-pay | Admitting: Internal Medicine

## 2021-03-31 ENCOUNTER — Other Ambulatory Visit: Payer: Self-pay

## 2021-03-31 ENCOUNTER — Encounter (INDEPENDENT_AMBULATORY_CARE_PROVIDER_SITE_OTHER): Payer: Self-pay | Admitting: Family Medicine

## 2021-03-31 ENCOUNTER — Ambulatory Visit (INDEPENDENT_AMBULATORY_CARE_PROVIDER_SITE_OTHER): Payer: Commercial Managed Care - HMO | Admitting: Family Medicine

## 2021-03-31 VITALS — BP 147/77 | HR 64 | Temp 98.4°F | Ht 64.0 in | Wt 175.0 lb

## 2021-03-31 DIAGNOSIS — E559 Vitamin D deficiency, unspecified: Secondary | ICD-10-CM | POA: Diagnosis not present

## 2021-03-31 DIAGNOSIS — Z9189 Other specified personal risk factors, not elsewhere classified: Secondary | ICD-10-CM | POA: Diagnosis not present

## 2021-03-31 DIAGNOSIS — K5900 Constipation, unspecified: Secondary | ICD-10-CM | POA: Diagnosis not present

## 2021-03-31 DIAGNOSIS — Z794 Long term (current) use of insulin: Secondary | ICD-10-CM | POA: Diagnosis not present

## 2021-03-31 DIAGNOSIS — E1169 Type 2 diabetes mellitus with other specified complication: Secondary | ICD-10-CM | POA: Diagnosis not present

## 2021-03-31 DIAGNOSIS — Z6830 Body mass index (BMI) 30.0-30.9, adult: Secondary | ICD-10-CM

## 2021-03-31 MED ORDER — LINACLOTIDE 145 MCG PO CAPS: 145.0000 ug | ORAL_CAPSULE | Freq: Every day | ORAL | 0 refills | Status: DC

## 2021-03-31 MED ORDER — SEMAGLUTIDE (2 MG/DOSE) 8 MG/3ML ~~LOC~~ SOPN: 2.0000 mg | PEN_INJECTOR | SUBCUTANEOUS | 0 refills | Status: DC

## 2021-04-01 LAB — VITAMIN D 25 HYDROXY (VIT D DEFICIENCY, FRACTURES): Vit D, 25-Hydroxy: 82.1 ng/mL (ref 30.0–100.0)

## 2021-04-02 NOTE — Progress Notes (Signed)
Chief Complaint:   OBESITY Leslie Davis is here to discuss her progress with her obesity treatment plan along with follow-up of her obesity related diagnoses. Leslie Davis is on the Category 1 Plan and states she is following her eating plan approximately 90% of the time. Leslie Davis states she is at the gym for 60-90 minutes 2-3 times per week.  Today's visit was #: 13 Starting weight: 208 lbs Starting date: 07/23/2020 Today's weight: 175 lbs Today's date: 03/31/2021 Total lbs lost to date: 33 Total lbs lost since last in-office visit: 0  Interim History: Leslie Davis is here for a follow up office visit. We reviewed her meal plan and questions were answered. Patient's food recall appears to be accurate and consistent with what is on plan when she is following it. When eating on plan, her hunger and cravings are well controlled. Leslie Davis snack calories are used for apple, pears, and strawberries. She has been following the meal plan closely, but she thinks she is not getting in enough protein, as she is not measuring. She denies hunger or cravings. She had knee surgery in August, and she continues at the gym 2-3 days per week.  Subjective:   1. Type 2 diabetes mellitus with other specified complication, with long-term current use of insulin (HCC) Chiniqua's fasting blood sugars range between 70-80. Her recent A1c was 6.1 one month ago. When she started our program her A1c was 8.7. she does great with her blood sugars, meals plan, and exercise. I discussed labs with the patient today.  2. Vitamin D deficiency Claudis's Vit D level was up to 102 approximately 4 months ago. She was taken off prescription Vit D, and she was told to stop supplementation by her primary care physician at that time. She hasn't taken anything in several months now. I discussed labs with the patient today.  3. Acute on chronic constipation, unspecified constipation type Leslie Davis uses [name], and she notes it works well  with no side effects. She requests a refill today.  4. At risk for malnutrition Leslie Davis is at increased risk for malnutrition due to inadequate protein intake.  Assessment/Plan:   Orders Placed This Encounter  Procedures   VITAMIN D 25 Hydroxy (Vit-D Deficiency, Fractures)    Medications Discontinued During This Encounter  Medication Reason   Semaglutide, 1 MG/DOSE, (OZEMPIC, 1 MG/DOSE,) 4 MG/3ML SOPN Dose change   linaclotide ([name]) 145 MCG CAPS capsule Reorder     Meds ordered this encounter  Medications   linaclotide ([name]) 145 MCG CAPS capsule    Sig: Take 1 capsule (145 mcg total) by mouth daily.    Dispense:  30 capsule    Refill:  0   Semaglutide, 2 MG/DOSE, 8 MG/3ML SOPN    Sig: Inject 2 mg as directed once a week.    Dispense:  3 mL    Refill:  0    30 d supply, once tolerates well, will inc to 90 day     1. Type 2 diabetes mellitus with other specified complication, with long-term current use of insulin (HCC) Risks and benefits of the medications were discussed with the patient. She desires to increase Ozempic and decrease insulin. Elene agreed to increase Ozempic to 2 mg weekly with no refills (up from 1 mg); and she agreed to decrease Toujeo to 20 units or less qhs.   - Semaglutide, 2 MG/DOSE, 8 MG/3ML SOPN; Inject 2 mg as directed once a week.  Dispense: 3 mL; Refill: 0  2.  Vitamin D deficiency We will recheck Vit D level today to ensure that her level is within normal limits. She is to have bone density tests every 2 years.  - VITAMIN D 25 Hydroxy (Vit-D Deficiency, Fractures)  3. Acute on chronic constipation, unspecified constipation type We will refill [name] for 1 month, and we will follow up at Latoya's next office visit.  - linaclotide ([name]) 145 MCG CAPS capsule; Take 1 capsule (145 mcg total) by mouth daily.  Dispense: 30 capsule; Refill: 0  4. At risk for malnutrition Leslie Davis was given approximately 9 minutes of counseling today  regarding prevention of malnutrition and ways to meet macronutrient goals..   5. Obesity with current BMI of 30.1 Leslie Davis is currently in the action stage of change. As such, her goal is to continue with weight loss efforts. She has agreed to the Category 1 Plan.   Exercise goals: As is.  Behavioral modification strategies: increasing lean protein intake and no skipping meals.  Leslie Davis has agreed to follow-up with our clinic in 3 weeks. She was informed of the importance of frequent follow-up visits to maximize her success with intensive lifestyle modifications for her multiple health conditions.   Objective:   Blood pressure (!) 147/77, pulse 64, temperature 98.4 F (36.9 C), height 5\' 4"  (1.626 m), weight 175 lb (79.4 kg), SpO2 98 %. Body mass index is 30.04 kg/m.  General: Cooperative, alert, well developed, in no acute distress. HEENT: Conjunctivae and lids unremarkable. Cardiovascular: Regular rhythm.  Lungs: Normal work of breathing. Neurologic: No focal deficits.   Lab Results  Component Value Date   CREATININE 1.17 (H) 02/26/2021   BUN 23 02/26/2021   NA 142 02/26/2021   K 4.6 02/26/2021   CL 102 02/26/2021   CO2 23 02/26/2021   Lab Results  Component Value Date   ALT 17 02/26/2021   AST 17 02/26/2021   ALKPHOS 142 (H) 02/26/2021   BILITOT <0.2 02/26/2021   Lab Results  Component Value Date   HGBA1C 6.1 (H) 02/26/2021   HGBA1C 6.3 (H) 10/22/2020   HGBA1C 6.8 (H) 08/25/2020   HGBA1C 8.7 (H) 05/28/2020   HGBA1C 8.8 (H) 02/21/2020   No results found for: INSULIN Lab Results  Component Value Date   TSH 1.790 07/23/2020   Lab Results  Component Value Date   CHOL 156 11/20/2020   HDL 61 11/20/2020   LDLCALC 78 11/20/2020   TRIG 89 11/20/2020   CHOLHDL 2.6 11/20/2020   Lab Results  Component Value Date   VD25OH 82.1 03/31/2021   VD25OH 102.0 (H) 11/20/2020   VD25OH 95.2 10/22/2020   Lab Results  Component Value Date   WBC 5.1 12/23/2020   HGB  11.2 (L) 12/23/2020   HCT 34.9 (L) 12/23/2020   MCV 85.1 12/23/2020   PLT 256.0 12/23/2020   Lab Results  Component Value Date   IRON 68 11/16/2017   TIBC 355 11/16/2017   FERRITIN 45.2 11/16/2017   Attestation Statements:   Reviewed by clinician on day of visit: allergies, medications, problem list, medical history, surgical history, family history, social history, and previous encounter notes.   Wilhemena Durie, am acting as transcriptionist for [employer], DO.  I have reviewed the above documentation for accuracy and completeness, and I agree with the above. Marjory Sneddon, D.O.  The [place] was signed into law in 2016 which includes the topic of electronic health records.  This provides immediate access to information in MyChart.  This includes consultation notes, operative notes, office notes, lab results and pathology reports.  If you have any questions about what you read please let us know at your next visit so we can discuss your concerns and take corrective action if need be.  We are right here with you.

## 2021-04-07 ENCOUNTER — Ambulatory Visit (INDEPENDENT_AMBULATORY_CARE_PROVIDER_SITE_OTHER): Payer: Medicare Other

## 2021-04-07 ENCOUNTER — Telehealth: Payer: Medicare Other

## 2021-04-07 DIAGNOSIS — M1712 Unilateral primary osteoarthritis, left knee: Secondary | ICD-10-CM

## 2021-04-07 DIAGNOSIS — E1122 Type 2 diabetes mellitus with diabetic chronic kidney disease: Secondary | ICD-10-CM

## 2021-04-07 DIAGNOSIS — N182 Chronic kidney disease, stage 2 (mild): Secondary | ICD-10-CM

## 2021-04-07 DIAGNOSIS — I129 Hypertensive chronic kidney disease with stage 1 through stage 4 chronic kidney disease, or unspecified chronic kidney disease: Secondary | ICD-10-CM

## 2021-04-07 NOTE — Chronic Care Management (AMB) (Signed)
Chronic Care Management   CCM RN Visit Note  04/07/2021 Name: Leslie Davis MRN: [id number] DOB: [birth year]  Subjective: Leslie Davis is a 66 y.o. year old female who is a primary care patient of [name], MD. The care management team was consulted for assistance with disease management and care coordination needs.    Engaged with patient by telephone for follow up visit in response to provider referral for case management and/or care coordination services.   Consent to Services:  The patient was given information about Chronic Care Management services, agreed to services, and gave verbal consent prior to initiation of services.  Please see initial visit note for detailed documentation.   Patient agreed to services and verbal consent obtained.   Assessment: Review of patient past medical history, allergies, medications, health status, including review of consultants reports, laboratory and other test data, was performed as part of comprehensive evaluation and provision of chronic care management services.   SDOH (Social Determinants of Health) assessments and interventions performed:  Yes, no acute needs   CCM Care Plan  Allergies  Allergen Reactions   Fiasp [Insulin Aspart (W-Niacinamide)] Hives    Outpatient Encounter Medications as of 04/07/2021  Medication Sig   amLODipine (NORVASC) 10 MG tablet TAKE ONE TABLET BY MOUTH ONCE DAILY   aspirin EC 81 MG tablet Take 1 tablet (81 mg total) by mouth 2 (two) times daily. To be taken after surgery   Azilsartan Medoxomil (EDARBI) 80 MG TABS TAKE ONE TABLET BY MOUTH ONCE DAILY   Blood Glucose Monitoring Suppl (ONETOUCH VERIO) w/Device KIT Use as directed to check blood sugars 2 times per day dx: e11.65   cetirizine (ZYRTEC) 10 MG tablet Take 1 tablet (10 mg total) by mouth daily.   Cholecalciferol (D3-1000) 25 MCG (1000 UT) tablet Take 1 tablet (1,000 Units total) by mouth daily.   dapagliflozin propanediol (FARXIGA) 10 MG  TABS tablet Take 1 tablet (10 mg total) by mouth daily before breakfast.   ferrous gluconate (FERGON) 324 MG tablet TAKE 1 TABLET BY MOUTH TWICE DAILY WITH A MEAL. (Patient taking differently: Take 324 mg by mouth 2 (two) times daily with a meal.)   glucose blood (ONETOUCH VERIO) test strip Use as instructed to check blood sugars 2 times per day dx:e11.65   ibuprofen (ADVIL) 800 MG tablet Take 1 tablet (800 mg total) by mouth every 8 (eight) hours as needed.   insulin glargine, 1 Unit Dial, (TOUJEO SOLOSTAR) 300 UNIT/ML Solostar Pen Inject 28 Units into the skin at bedtime. (Patient taking differently: Inject 20 Units into the skin at bedtime.)   insulin lispro (HUMALOG KWIKPEN) 100 UNIT/ML KwikPen Sliding scale (Patient taking differently: Inject 25 Units into the skin daily as needed (CBG over 150). Sliding scale)   linaclotide ([name]) 145 MCG CAPS capsule Take 1 capsule (145 mcg total) by mouth daily.   OneTouch Delica Lancets 56P MISC Use as directed to check blood sugars 2 times per day dx:e11.65   pantoprazole (PROTONIX) 40 MG tablet Take one tablet by mouth 3 days a week   rosuvastatin (CRESTOR) 20 MG tablet TAKE ONE TABLET BY MOUTH ONCE DAILY   Semaglutide, 2 MG/DOSE, 8 MG/3ML SOPN Inject 2 mg as directed once a week.   temazepam (RESTORIL) 30 MG capsule TAKE ONE CAPSULE BY MOUTH EVERYDAY AT BEDTIME AS NEEDED   traMADol (ULTRAM) 50 MG tablet TAKE 1 TO 2 TABLETS BY MOUTH daily AS NEEDED   TRUEPLUS PEN NEEDLES 31G X 6 MM MISC  USE WITH pen TO INJECT insulin DAILY   No facility-administered encounter medications on file as of 04/07/2021.    Patient Active Problem List   Diagnosis Date Noted   Class 2 severe obesity with serious comorbidity and body mass index (BMI) of 35.0 to 35.9 in adult [facility]) 12/01/2020   Status post total left knee replacement 10/27/2020   Primary osteoarthritis of left knee    NAFLD (nonalcoholic fatty liver disease) 08/06/2020   Other fatigue 07/23/2020   SOBOE  (shortness of breath on exertion) 07/23/2020   Diabetes mellitus ([place]) 07/23/2020   Hypertension associated with type 2 diabetes mellitus ([place]) 07/23/2020   Hyperlipidemia associated with type 2 diabetes mellitus ([place]) 07/23/2020   Absolute anemia 07/23/2020   Vitamin D deficiency 07/23/2020   At risk for heart disease 07/23/2020   AKI (acute kidney injury) ([place]) 09/07/2019   Acute kidney injury ([place]) 09/06/2019   Bradycardia 09/06/2019   Hyperglycemia 09/06/2019   HLD (hyperlipidemia)    Hypertensive nephropathy 05/16/2019   Erosive gastritis 11/16/2017   Fatty liver 11/16/2017   Abdominal pain 12/11/2012   Early satiety 12/11/2012   Hiatal hernia    Hx of cholecystectomy 02/05/2011   Flu-like symptoms 02/05/2011   CONSTIPATION 08/27/2008   FATTY LIVER DISEASE 05/07/2008   Nausea with vomiting 04/23/2008   EPIGASTRIC PAIN 04/23/2008   RENAL CALCULUS 04/22/2008   Type 2 diabetes mellitus with stage 2 chronic kidney disease, with long-term current use of insulin ([place]) 05/19/2006   Type 2 diabetes mellitus with hyperlipidemia ([place]) 05/19/2006   HYPERTENSION, BENIGN SYSTEMIC 05/19/2006   GASTROESOPHAGEAL REFLUX, NO ESOPHAGITIS 05/19/2006   AMENORRHEA 05/19/2006    Conditions to be addressed/monitored: Type 2 diabetes mellitus, Hypertensive nephropathy, Chronic renal failure, Primary Osteoarthritis   Care Plan : RN Care Manager Plan of Care  Updates made by Lynne Logan, RN since 04/07/2021 12:00 AM     Problem: No Plan of Care Established for management of chronic disease states (Type 2 diabetes mellitus, Hypertensive nephropathy, Chronic renal failure, Primary Osteoarthritis)   Priority: High     Long-Range Goal: Establishment of Plan of Care for chronic disease states (Type 2 diabetes mellitus, Hypertensive nephropathy, Chronic renal failure, Primary Osteoarthritis)   Start Date: 04/07/2021  Expected End Date: 04/07/2022  Priority: High  Note:   Current Barriers:   Knowledge Deficits related to plan of care for management of Type 2 diabetes mellitus, Hypertensive nephropathy, Chronic renal failure, Primary Osteoarthritis   Chronic Disease Management support and education needs related to Type 2 diabetes mellitus, Hypertensive nephropathy, Chronic renal failure, Primary Osteoarthritis    RNCM Clinical Goal(s):  Patient will verbalize basic understanding of  Type 2 diabetes mellitus, Hypertensive nephropathy, Chronic renal failure, Primary Osteoarthritis  disease process and self health management plan as evidenced by patient will report having no disease exacerbations related to her chronic disease states as listed above  take all medications exactly as prescribed and will call provider for medication related questions as evidenced by patient will report having no missed doses of her prescribed medications  demonstrate Ongoing health management independence as evidenced by patient will report 100% adherence to her prescribed treatment plan  continue to work with RN Care Manager to address care management and care coordination needs related to  Type 2 diabetes mellitus, Hypertensive nephropathy, Chronic renal failure, Primary Osteoarthritis  as evidenced by adherence to CM Team Scheduled appointments demonstrate ongoing self health care management ability   as evidenced by    through collaboration  with RN Care manager, provider, and care team.   Interventions: 1:1 collaboration with primary care provider regarding development and update of comprehensive plan of care as evidenced by provider attestation and co-signature Inter-disciplinary care team collaboration (see longitudinal plan of care) Evaluation of current treatment plan related to  self management and patient's adherence to plan as established by provider    Chronic Kidney Disease Interventions:  (Status:  Goal on track:  Yes.) Long Term Goal Assessed the Patient understanding of chronic kidney  disease    Evaluation of current treatment plan related to chronic kidney disease self management and patient's adherence to plan as established by provider      Reviewed prescribed diet increase daily water intake to 64 oz daily Provided education on kidney disease progression    Discussed plans with patient for ongoing care management follow up and provided patient with direct contact information for care management team Last practice recorded BP readings:  BP Readings from Last 3 Encounters:  03/31/21 (!) 147/77  03/04/21 (!) 150/80  02/26/21 (!) 156/82  Most recent eGFR/CrCl:  Lab Results  Component Value Date   EGFR 51 (L) 02/26/2021    No components found for: CRCL    Diabetes Interventions:  (Status:  Goal on track:  Yes.) Long Term Goal Assessed patient's understanding of A1c goal:  <5.7 Provided education to patient about basic DM disease process Reviewed medications with patient and discussed importance of medication adherence Provided patient with written educational materials related to hypo and hyperglycemia and importance of correct treatment Advised patient, providing education and rationale, to check cbg daily before meals and at bedtime, before and after exercise and or when feeling her blood sugar is too high or too low and record, calling PCP and or RN CM for findings outside established parameters Review of patient status, including review of consultants reports, relevant laboratory and other test results, and medications completed Assessed social determinant of health barriers Discussed plans with patient for ongoing care management follow up and provided patient with direct contact information for care management team Lab Results  Component Value Date   HGBA1C 6.1 (H) 02/26/2021   Hypertension Interventions:  (Status:  Condition stable.  Not addressed this visit.) Long Term Goal Last practice recorded BP readings:  BP Readings from Last 3 Encounters:  03/31/21  (!) 147/77  03/04/21 (!) 150/80  02/26/21 (!) 156/82  Most recent eGFR/CrCl:  Lab Results  Component Value Date   EGFR 51 (L) 02/26/2021    No components found for: CRCL  Evaluation of current treatment plan related to hypertension self management and patient's adherence to plan as established by provider Counseled on the importance of exercise goals with target of 150 minutes per week Provided education on prescribed diet low Sodium Discussed plans with patient for ongoing care management follow up and provided patient with direct contact information for care management team  Pain Interventions:  (Status:  Goal on track:  Yes.) Long Term Goal Pain assessment performed Medications reviewed Reviewed provider established plan for pain management Discussed importance of adherence to all scheduled medical appointments Counseled on the importance of reporting any/all new or changed pain symptoms or management strategies to pain management provider Advised patient to report to care team affect of pain on daily activities Discussed use of relaxation techniques and/or diversional activities to assist with pain reduction (distraction, imagery, relaxation, massage, acupressure, TENS, heat, and cold application Reviewed with patient prescribed pharmacological and nonpharmacological pain relief strategies Determined patient has  completed outpatient PT following a right total Knee replacement, she continues to exercise 3 x per week at a local gym with good results  Discussed plans with patient for ongoing care management follow up and provided patient with direct contact information for care management team  Patient Goals/Self-Care Activities: Take all medications as prescribed Attend all scheduled provider appointments Call pharmacy for medication refills 3-7 days in advance of running out of medications Perform IADL's (shopping, preparing meals, housekeeping, managing finances) independently Call  provider office for new concerns or questions  check blood sugar at prescribed times: before meals and at bedtime, when you have symptoms of low or high blood sugar, and before and after exercise drink 6 to 8 glasses of water each day manage portion size  Follow Up Plan:  Telephone follow up appointment with care management team member scheduled for:  06/01/21      Plan:Telephone follow up appointment with care management team member scheduled for:  06/01/21  Barb Merino, RN, BSN, CCM Care Management Coordinator [place] [facility]  Direct Phone: [phone]

## 2021-04-07 NOTE — Patient Instructions (Signed)
Visit Information  Thank you for taking time to visit with me today. Please don't hesitate to contact me if I can be of assistance to you before our next scheduled telephone appointment.  Following are the goals we discussed today:  (Copy and paste patient goals from clinical care plan here)  Our next appointment is by telephone on 06/01/21 at 12:05 PM  Please call the care guide team at [phone] if you need to cancel or reschedule your appointment.   If you are experiencing a Mental Health or [place] or need someone to talk to, please call [phone]-TALK (toll free, 24 hour hotline)   Patient verbalizes understanding of instructions and care plan provided today and agrees to view in [place]. Active MyChart status confirmed with patient.    Barb Merino, RN, BSN, CCM Care Management Coordinator [place] [facility]  Direct Phone: [phone]

## 2021-04-15 ENCOUNTER — Ambulatory Visit (INDEPENDENT_AMBULATORY_CARE_PROVIDER_SITE_OTHER): Payer: Medicare Other | Admitting: Internal Medicine

## 2021-04-15 ENCOUNTER — Other Ambulatory Visit: Payer: Self-pay

## 2021-04-15 ENCOUNTER — Encounter: Payer: Self-pay | Admitting: Internal Medicine

## 2021-04-15 VITALS — BP 144/70 | HR 59 | Temp 98.1°F | Ht 64.0 in | Wt 179.0 lb

## 2021-04-15 DIAGNOSIS — J069 Acute upper respiratory infection, unspecified: Secondary | ICD-10-CM

## 2021-04-15 DIAGNOSIS — R059 Cough, unspecified: Secondary | ICD-10-CM | POA: Diagnosis not present

## 2021-04-15 DIAGNOSIS — R0981 Nasal congestion: Secondary | ICD-10-CM | POA: Diagnosis not present

## 2021-04-15 LAB — POC COVID19 BINAXNOW: SARS Coronavirus 2 Ag: NEGATIVE

## 2021-04-15 LAB — POC INFLUENZA A&B (BINAX/QUICKVUE)
Influenza A, POC: NEGATIVE
Influenza B, POC: NEGATIVE

## 2021-04-15 MED ORDER — TRIAMCINOLONE ACETONIDE 40 MG/ML IJ SUSP
40.0000 mg | Freq: Once | INTRAMUSCULAR | Status: AC
  Administered 2021-04-15: 13:00:00 40 mg via INTRAMUSCULAR

## 2021-04-15 MED ORDER — CEFTRIAXONE SODIUM 1 G IJ SOLR
1.0000 g | Freq: Once | INTRAMUSCULAR | Status: AC
  Administered 2021-04-15: 13:00:00 1 g via INTRAMUSCULAR

## 2021-04-15 NOTE — Progress Notes (Signed)
[name],acting as a Education administrator for [name], MD.,have documented all relevant documentation on the behalf of [name], MD,as directed by  [name], MD while in the presence of [name], MD.  This visit occurred during the SARS-CoV-2 public health emergency.  Safety protocols were in place, including screening questions prior to the visit, additional usage of staff PPE, and extensive cleaning of exam room while observing appropriate contact time as indicated for disinfecting solutions.  Subjective:     Patient ID: Leslie Davis , female    DOB: October 26, 1955 , 66 y.o.   MRN: [id number]   Chief Complaint  Patient presents with   URI    HPI  She presents today for further evaluation of cough.  Sx are associated with chest tightness, sore throat and sinus congestion. Sx started about ten days ago. Denies ill contacts. Cough has worsened over the past several weeks. She is unable to sleep at night due to the cough.   URI  This is a new problem. The current episode started 1 to 4 weeks ago. The problem has been unchanged. There has been no fever. Associated symptoms include congestion, coughing, headaches, sneezing and a sore throat. Pertinent negatives include no ear pain or sinus pain. She has tried decongestant (mucinex) for the symptoms. The treatment provided no relief.    Past Medical History:  Diagnosis Date   DM (diabetes mellitus) ([place])    Fatty liver disease, nonalcoholic    Gastritis    Gastroparesis    GERD (gastroesophageal reflux disease)    Heartburn    Hiatal hernia    HLD (hyperlipidemia)    HTN (hypertension)    Joint pain    Renal calculus    Rheumatoid arthritis ([place])    Vitamin D deficiency      Family History  Problem Relation Age of Onset   Diabetes Mother    Thyroid disease Mother    Kidney disease Mother    Diabetes Maternal Grandmother    Stomach cancer Maternal Aunt        X 2 aunts   Hypertension Father     Hyperlipidemia Father    Sudden death Father    Stroke Father    Colon cancer Neg Hx    Esophageal cancer Neg Hx    Rectal cancer Neg Hx      Current Outpatient Medications:    amLODipine (NORVASC) 10 MG tablet, TAKE ONE TABLET BY MOUTH ONCE DAILY, Disp: 90 tablet, Rfl: 3   amoxicillin-clavulanate (AUGMENTIN) 875-125 MG tablet, Take 1 tablet by mouth 2 (two) times daily for 10 days., Disp: 20 tablet, Rfl: 0   aspirin EC 81 MG tablet, Take 1 tablet (81 mg total) by mouth 2 (two) times daily. To be taken after surgery, Disp: 84 tablet, Rfl: 0   Azilsartan Medoxomil (EDARBI) 80 MG TABS, TAKE ONE TABLET BY MOUTH ONCE DAILY, Disp: 90 tablet, Rfl: 2   Blood Glucose Monitoring Suppl (ONETOUCH VERIO) w/Device KIT, Use as directed to check blood sugars 2 times per day dx: e11.65, Disp: 1 kit, Rfl: 1   cetirizine (ZYRTEC) 10 MG tablet, Take 1 tablet (10 mg total) by mouth daily., Disp: 90 tablet, Rfl: 2   Cholecalciferol (D3-1000) 25 MCG (1000 UT) tablet, Take 1 tablet (1,000 Units total) by mouth daily., Disp: 30 tablet, Rfl: 0   dapagliflozin propanediol (FARXIGA) 10 MG TABS tablet, Take 1 tablet (10 mg total) by mouth daily before breakfast., Disp: 90  tablet, Rfl: 1   ferrous gluconate (FERGON) 324 MG tablet, TAKE 1 TABLET BY MOUTH TWICE DAILY WITH A MEAL. (Patient taking differently: Take 324 mg by mouth 2 (two) times daily with a meal.), Disp: 100 tablet, Rfl: 0   glucose blood (ONETOUCH VERIO) test strip, Use as instructed to check blood sugars 2 times per day dx:e11.65, Disp: 150 each, Rfl: 3   ibuprofen (ADVIL) 800 MG tablet, Take 1 tablet (800 mg total) by mouth every 8 (eight) hours as needed., Disp: 30 tablet, Rfl: 2   insulin glargine, 1 Unit Dial, (TOUJEO SOLOSTAR) 300 UNIT/ML Solostar Pen, Inject 28 Units into the skin at bedtime. (Patient taking differently: Inject 20 Units into the skin at bedtime.), Disp: 15 mL, Rfl: 0   insulin lispro (HUMALOG KWIKPEN) 100 UNIT/ML KwikPen, Sliding scale  (Patient taking differently: Inject 25 Units into the skin daily as needed (CBG over 150). Sliding scale), Disp: 30 mL, Rfl: 2   linaclotide ([name]) 145 MCG CAPS capsule, Take 1 capsule (145 mcg total) by mouth daily., Disp: 30 capsule, Rfl: 0   OneTouch Delica Lancets 33G MISC, Use as directed to check blood sugars 2 times per day dx:e11.65, Disp: 150 each, Rfl: 3   pantoprazole (PROTONIX) 40 MG tablet, Take one tablet by mouth 3 days a week, Disp: 90 tablet, Rfl: 2   rosuvastatin (CRESTOR) 20 MG tablet, TAKE ONE TABLET BY MOUTH ONCE DAILY, Disp: 90 tablet, Rfl: 2   Semaglutide, 2 MG/DOSE, 8 MG/3ML SOPN, Inject 2 mg as directed once a week., Disp: 3 mL, Rfl: 0   temazepam (RESTORIL) 30 MG capsule, TAKE ONE CAPSULE BY MOUTH EVERYDAY AT BEDTIME AS NEEDED, Disp: 60 capsule, Rfl: 2   traMADol (ULTRAM) 50 MG tablet, TAKE 1 TO 2 TABLETS BY MOUTH daily AS NEEDED, Disp: 30 tablet, Rfl: 2   TRUEPLUS PEN NEEDLES 31G X 6 MM MISC, USE WITH pen TO INJECT insulin DAILY, Disp: 150 each, Rfl: 3   Allergies  Allergen Reactions   Fiasp [Insulin Aspart (W-Niacinamide)] Hives     Review of Systems  Constitutional:  Positive for fatigue.  HENT:  Positive for congestion, sneezing and sore throat. Negative for ear pain and sinus pain.   Respiratory:  Positive for cough.   Cardiovascular: Negative.   Gastrointestinal: Negative.   Neurological:  Positive for headaches.  Psychiatric/Behavioral: Negative.      Today's Vitals   04/15/21 1158  BP: (!) 144/70  Pulse: (!) 59  Temp: 98.1 F (36.7 C)  Weight: 179 lb (81.2 kg)  Height: 5\' 4"  (1.626 m)  PainSc: 0-No pain   Body mass index is 30.73 kg/m.  Wt Readings from Last 3 Encounters:  04/15/21 179 lb (81.2 kg)  03/31/21 175 lb (79.4 kg)  03/04/21 175 lb (79.4 kg)     Objective:  Physical Exam Vitals and nursing note reviewed.  Constitutional:      Appearance: She is ill-appearing.  HENT:     Head: Normocephalic and atraumatic.     Right Ear:  Tympanic membrane, ear canal and external ear normal. There is no impacted cerumen.     Left Ear: Tympanic membrane, ear canal and external ear normal. There is no impacted cerumen.     Nose:     Comments: Masked     Mouth/Throat:     Comments: Masked  Eyes:     Extraocular Movements: Extraocular movements intact.  Cardiovascular:     Rate and Rhythm: Normal rate and regular rhythm.  Heart sounds: Normal heart sounds.  Pulmonary:     Effort: Pulmonary effort is normal.     Breath sounds: Rhonchi present.  Musculoskeletal:     Cervical back: Normal range of motion.  Skin:    General: Skin is warm.  Neurological:     General: No focal deficit present.     Mental Status: She is alert.  Psychiatric:        Mood and Affect: Mood normal.        Behavior: Behavior normal.        Assessment And Plan:     1. Upper respiratory tract infection, unspecified type Comments: She denies allergies to abx. She was given Kenalog, $RemoveBefore'40mg'TRdiYHqYORxCo$  IM x 1 and Rocephin, 1 gm IM x1. I will also send rx Augmentin to her local pharmacy.  - POC COVID-19 - POC Influenza A&B(BINAX/QUICKVUE) - Novel Coronavirus, NAA ([facility]) - triamcinolone acetonide (KENALOG-40) injection 40 mg - cefTRIAXone (ROCEPHIN) injection 1 g  2. Nasal congestion Comments: Rapid COVID test negative. I will send PCR.  - POC COVID-19 - POC Influenza A&B(BINAX/QUICKVUE) - Novel Coronavirus, NAA ([facility])   We discussed increased potential for false negative rapid COVID-19 tests early in illness with current circulating strains and PCR testing is recommended. Isolate while awaiting results. Also discussed sx are consistent with viral syndrome - supportive care and contagious nature of illness reviewed. Will prescribe cheratussin ac 10/$RemoveBeforeDEI'100mg'GAAOBHztESxdnxUW$ /$Rem'5mg'fQLq$  syrup and tessalon perles $RemoveBeforeD'200mg'XIWXfPUpIiaUZO$  as needed for cough. She is alo encouraged to stay well hydrated, rest and take Tylenol prn fever/sore throat.    Patient was given opportunity to ask questions.  Patient verbalized understanding of the plan and was able to repeat key elements of the plan. All questions were answered to their satisfaction.   I, [name], MD, have reviewed all documentation for this visit. The documentation on 04/15/21 for the exam, diagnosis, procedures, and orders are all accurate and complete.   IF YOU HAVE BEEN REFERRED TO A SPECIALIST, IT MAY TAKE 1-2 WEEKS TO SCHEDULE/PROCESS THE REFERRAL. IF YOU HAVE NOT HEARD FROM US/SPECIALIST IN TWO WEEKS, PLEASE GIVE [place] A CALL AT [phone].   THE PATIENT IS ENCOURAGED TO PRACTICE SOCIAL DISTANCING DUE TO THE COVID-19 PANDEMIC.

## 2021-04-15 NOTE — Patient Instructions (Signed)

## 2021-04-16 LAB — NOVEL CORONAVIRUS, NAA: SARS-CoV-2, NAA: NOT DETECTED

## 2021-04-16 LAB — SARS-COV-2, NAA 2 DAY TAT

## 2021-04-16 MED ORDER — AMOXICILLIN-POT CLAVULANATE 875-125 MG PO TABS: 1.0000 | ORAL_TABLET | Freq: Two times a day (BID) | ORAL | 0 refills | Status: AC

## 2021-04-21 ENCOUNTER — Ambulatory Visit (INDEPENDENT_AMBULATORY_CARE_PROVIDER_SITE_OTHER): Payer: Commercial Managed Care - HMO | Admitting: Family Medicine

## 2021-04-21 DIAGNOSIS — M1712 Unilateral primary osteoarthritis, left knee: Secondary | ICD-10-CM | POA: Diagnosis not present

## 2021-04-21 DIAGNOSIS — I129 Hypertensive chronic kidney disease with stage 1 through stage 4 chronic kidney disease, or unspecified chronic kidney disease: Secondary | ICD-10-CM | POA: Diagnosis not present

## 2021-04-21 DIAGNOSIS — Z794 Long term (current) use of insulin: Secondary | ICD-10-CM

## 2021-04-21 DIAGNOSIS — N182 Chronic kidney disease, stage 2 (mild): Secondary | ICD-10-CM

## 2021-04-21 DIAGNOSIS — E1122 Type 2 diabetes mellitus with diabetic chronic kidney disease: Secondary | ICD-10-CM

## 2021-04-22 ENCOUNTER — Other Ambulatory Visit (INDEPENDENT_AMBULATORY_CARE_PROVIDER_SITE_OTHER): Payer: Self-pay | Admitting: Family Medicine

## 2021-04-22 DIAGNOSIS — K5900 Constipation, unspecified: Secondary | ICD-10-CM

## 2021-04-22 DIAGNOSIS — E1169 Type 2 diabetes mellitus with other specified complication: Secondary | ICD-10-CM

## 2021-04-22 NOTE — Telephone Encounter (Signed)
Dr.Opalski ?

## 2021-04-24 ENCOUNTER — Telehealth: Payer: Self-pay

## 2021-04-24 NOTE — Chronic Care Management (AMB) (Signed)
Chronic Care Management Pharmacy Assistant   Name: Leslie Davis  MRN: [id number] DOB: 11-28-1955  Reason for Encounter: Disease State and Medication Review /Diabetes, Medication coordination  Recent office visits:  04-15-2021 [name], MD. START Augmentin 875-125 mg twice daily for 10 days. Kenelog and rocephin injection given. Negative covid test.  04-07-2021 Little, Claudette Stapler, RN (CCM)  Recent consult visits:  03-31-2021 [name], DO (Weight management). INCREASE Ozempic 1 mg weekly TO 2 mg weekly.  03-11-2021 Laureen Abrahams, PT (Physical therapy). Therapy on:  Chronic pain of left knee Localized edema Muscle weakness (generalized) Other abnormalities of gait and mobility  03-05-2021 Laureen Abrahams, PT (Physical therapy). Therapy on:  Chronic pain of left knee Localized edema Muscle weakness (generalized) Other abnormalities of gait and mobility  03-04-2021 [name], MD (Orthopedic therapy). XR Knee 1-2 Views Left completed.  03-04-2021 [name], DO (Weight management). Completed Vitamin D3 50,000 unit 1 capsule every 2 weeks. START Vitamin D3 1,000 units daily. DECREASE Toujeo 30 units at bedtime TO 28 units at bedtime.  03-03-2021 Laureen Abrahams, PT (Physical therapy). Physical therapy for Stiffness of left knee, not elsewhere classified Chronic pain of left knee Localized edema Muscle weakness (generalized) Other abnormalities of gait and mobility  Hospital visits:  Medication Reconciliation was completed by comparing discharge summary, patients EMR and Pharmacy list, and upon discussion with patient.   Admitted to the hospital on 10/27/2020 due to Left total knee arthroplasy. Discharge date was 10/28/2020. Discharged from [facility]?Medications Started at [facility] Discharge:?None   Medication Changes at Hospital Discharge: None   Medications Discontinued at Hospital  Discharge: -Stopped Acetaminophen-codeine 300- 30 mg, Cipro 500 mg, Ibuprofen $RemoveBefor'200mg'MwsoEHisPbOx$   due to Left total knee arthroplasy   Medications that remain the same after Hospital Discharge:??  -All other medications will remain the same.     Medications: Outpatient Encounter Medications as of 04/24/2021  Medication Sig   amLODipine (NORVASC) 10 MG tablet TAKE ONE TABLET BY MOUTH ONCE DAILY   amoxicillin-clavulanate (AUGMENTIN) 875-125 MG tablet Take 1 tablet by mouth 2 (two) times daily for 10 days.   aspirin EC 81 MG tablet Take 1 tablet (81 mg total) by mouth 2 (two) times daily. To be taken after surgery   Azilsartan Medoxomil (EDARBI) 80 MG TABS TAKE ONE TABLET BY MOUTH ONCE DAILY   Blood Glucose Monitoring Suppl (ONETOUCH VERIO) w/Device KIT Use as directed to check blood sugars 2 times per day dx: e11.65   cetirizine (ZYRTEC) 10 MG tablet Take 1 tablet (10 mg total) by mouth daily.   Cholecalciferol (D3-1000) 25 MCG (1000 UT) tablet Take 1 tablet (1,000 Units total) by mouth daily.   dapagliflozin propanediol (FARXIGA) 10 MG TABS tablet Take 1 tablet (10 mg total) by mouth daily before breakfast.   ferrous gluconate (FERGON) 324 MG tablet TAKE 1 TABLET BY MOUTH TWICE DAILY WITH A MEAL. (Patient taking differently: Take 324 mg by mouth 2 (two) times daily with a meal.)   glucose blood (ONETOUCH VERIO) test strip Use as instructed to check blood sugars 2 times per day dx:e11.65   ibuprofen (ADVIL) 800 MG tablet Take 1 tablet (800 mg total) by mouth every 8 (eight) hours as needed.   insulin glargine, 1 Unit Dial, (TOUJEO SOLOSTAR) 300 UNIT/ML Solostar Pen Inject 28 Units into the skin at bedtime. (Patient taking differently: Inject 20 Units into the skin at bedtime.)   insulin lispro (HUMALOG KWIKPEN)  100 UNIT/ML KwikPen Sliding scale (Patient taking differently: Inject 25 Units into the skin daily as needed (CBG over 150). Sliding scale)   linaclotide ([name]) 145 MCG CAPS capsule Take 1 capsule  (145 mcg total) by mouth daily.   OneTouch Delica Lancets 62B MISC Use as directed to check blood sugars 2 times per day dx:e11.65   pantoprazole (PROTONIX) 40 MG tablet Take one tablet by mouth 3 days a week   rosuvastatin (CRESTOR) 20 MG tablet TAKE ONE TABLET BY MOUTH ONCE DAILY   Semaglutide, 2 MG/DOSE, 8 MG/3ML SOPN Inject 2 mg as directed once a week.   temazepam (RESTORIL) 30 MG capsule TAKE ONE CAPSULE BY MOUTH EVERYDAY AT BEDTIME AS NEEDED   traMADol (ULTRAM) 50 MG tablet TAKE 1 TO 2 TABLETS BY MOUTH daily AS NEEDED   TRUEPLUS PEN NEEDLES 31G X 6 MM MISC USE WITH pen TO INJECT insulin DAILY   No facility-administered encounter medications on file as of 04/24/2021.  Reviewed chart for medication changes ahead of medication coordination call.  No medication changes indicated OR if recent visit, treatment plan here.  BP Readings from Last 3 Encounters:  04/15/21 (!) 144/70  03/31/21 (!) 147/77  03/04/21 (!) 150/80    Lab Results  Component Value Date   HGBA1C 6.1 (H) 02/26/2021    Recent Relevant Labs: Lab Results  Component Value Date/Time   HGBA1C 6.1 (H) 02/26/2021 12:06 PM   HGBA1C 6.3 (H) 10/22/2020 03:00 PM   MICROALBUR 150 11/15/2019 10:20 AM    Kidney Function Lab Results  Component Value Date/Time   CREATININE 1.17 (H) 02/26/2021 12:06 PM   CREATININE 1.19 12/23/2020 03:22 PM   CREATININE 0.74 08/30/2011 01:07 PM   GFR 47.43 (L) 12/23/2020 03:22 PM   GFRNONAA >60 10/28/2020 04:19 AM   GFRAA 57 (L) 02/21/2020 03:00 PM    Current antihyperglycemic regimen:  Ozempic 2 mg weekly  What recent interventions/DTPs have been made to improve glycemic control:  None  Have there been any recent hospitalizations or ED visits since last visit with CPP? No  Patient denies hypoglycemic symptoms  Patient denies hyperglycemic symptoms  How often are you checking your blood sugar? once daily  What are your blood sugars ranging?  Fasting: 89, 99, 103 Before meals:  None After meals: None Bedtime: None  During the week, how often does your blood glucose drop below 70? Never  Are you checking your feet daily/regularly? Patient stated daily   Adherence Review: Is the patient currently on a STATIN medication? Yes Is the patient currently on ACE/ARB medication? Yes Does the patient have >5 day gap between last estimated fill dates? No  Patient obtains medications through Vials  30 Days   Last adherence delivery included:  Rosuvastatin 20 mg- 1 tablet daily Amlodipine 10 mg -1 tablet daily Edarbi 80 mg- 1 tablet daily  Fergon 324 mg  2 times a day Zyrtec 10 mg  1 tablet daily Temazepam 30 mg 1 tablet at bedtime as needed  Colace 100 mg 1 capsule by mouth daily as needed Pantoprazole 40 mg  1 tablet 3 days a week Ozempic 1mg  Inject 1 mg once a week  Farxiga 10mg  1 tablet daily One touch verio strips use twice daily as directed Trazodone 50mg  1-2 tablets daily as needed  Patient declined medications last delivery: Vitamin D3- patient receives over the counter Aspirin 81 mg due to being Discontinued  Hydrocodone 5/325 mg- due to being Discontinued  Patient is due for next adherence  delivery on: 05-05-2021  Called patient and reviewed medications and coordinated delivery.  This delivery to include: Ozempic 2 mg weekly Colace 100 mg 1 capsule by mouth daily as needed [name] 145 mcg once daily Tramadol 50 mg 1-2 tablets daily as needed Temazepam 30 mg 1 tablet at bedtime as needed  Ibuprofen 800 mg every 8 hours as needed Aspirin 81 mg daily  No short/ acute fill needed   Patient declined the following medications: None  Patient needs refills for: Contacted [name], DO office for refills on Ozempic and linzess and was told they only except fax request from pharmacy or patient request through my chart. Updated Chasity at [employer]. Contacted Aundra Dubin, PA-C for aspirin refill request.  Ozempic 2 mg  [name] 145  mcg  Aspirin 81 mg   Confirmed delivery date of advised patient that pharmacy will contact them the morning of delivery.  Care Gaps: PNA Vac overdue Covid booster overdue Yearly foot exam overdue  Star Rating Drugs: Farxiga 10 mg- Last filled 03-30-2021 [facility] Azilsartan 80 mg- Last filled 03-31-2021 [facility] Ozempic 2 mg- Last filled 04-01-2021 [facility] Rosuvastatin 20 mg- Last filled 03-31-2021 No DS No pharmacy   [place] Clinical Pharmacist Assistant [phone]

## 2021-04-27 ENCOUNTER — Other Ambulatory Visit: Payer: Self-pay

## 2021-04-27 ENCOUNTER — Ambulatory Visit (INDEPENDENT_AMBULATORY_CARE_PROVIDER_SITE_OTHER): Payer: Medicare Other | Admitting: Family Medicine

## 2021-04-27 ENCOUNTER — Telehealth: Payer: Self-pay | Admitting: Orthopaedic Surgery

## 2021-04-27 ENCOUNTER — Encounter (INDEPENDENT_AMBULATORY_CARE_PROVIDER_SITE_OTHER): Payer: Self-pay | Admitting: Family Medicine

## 2021-04-27 VITALS — BP 146/79 | HR 57 | Temp 97.5°F | Ht 64.0 in | Wt 171.0 lb

## 2021-04-27 DIAGNOSIS — Z7985 Long-term (current) use of injectable non-insulin antidiabetic drugs: Secondary | ICD-10-CM | POA: Diagnosis not present

## 2021-04-27 DIAGNOSIS — E559 Vitamin D deficiency, unspecified: Secondary | ICD-10-CM

## 2021-04-27 DIAGNOSIS — Z794 Long term (current) use of insulin: Secondary | ICD-10-CM

## 2021-04-27 DIAGNOSIS — I152 Hypertension secondary to endocrine disorders: Secondary | ICD-10-CM

## 2021-04-27 DIAGNOSIS — K5900 Constipation, unspecified: Secondary | ICD-10-CM | POA: Diagnosis not present

## 2021-04-27 DIAGNOSIS — E1159 Type 2 diabetes mellitus with other circulatory complications: Secondary | ICD-10-CM | POA: Diagnosis not present

## 2021-04-27 DIAGNOSIS — E1169 Type 2 diabetes mellitus with other specified complication: Secondary | ICD-10-CM

## 2021-04-27 DIAGNOSIS — Z6829 Body mass index (BMI) 29.0-29.9, adult: Secondary | ICD-10-CM

## 2021-04-27 DIAGNOSIS — Z6835 Body mass index (BMI) 35.0-35.9, adult: Secondary | ICD-10-CM

## 2021-04-27 DIAGNOSIS — E669 Obesity, unspecified: Secondary | ICD-10-CM

## 2021-04-27 MED ORDER — TOUJEO SOLOSTAR 300 UNIT/ML ~~LOC~~ SOPN: PEN_INJECTOR | SUBCUTANEOUS | Status: DC

## 2021-04-27 MED ORDER — SEMAGLUTIDE (2 MG/DOSE) 8 MG/3ML ~~LOC~~ SOPN: 2.0000 mg | PEN_INJECTOR | SUBCUTANEOUS | 0 refills | Status: DC

## 2021-04-27 NOTE — Telephone Encounter (Signed)
[facility] called. Patient would like a refill on Aspirin.

## 2021-04-28 NOTE — Telephone Encounter (Signed)
Only needed for 6 weeks post-op from our standpoint so she does not need a refill

## 2021-04-28 NOTE — Telephone Encounter (Signed)
Left message with [facility] no longer needed

## 2021-04-28 NOTE — Progress Notes (Signed)
Chief Complaint:   OBESITY Leslie Davis is here to discuss her progress with her obesity treatment plan along with follow-up of her obesity related diagnoses. Leslie Davis is on the Category 1 Plan and states she is following her eating plan approximately 80% of the time. Leslie Davis states she is going to the gym 120 minutes 2 times per week.  Today's visit was #: 14 Starting weight: 208 lbs Starting date: 07/23/2020 Today's weight: 171 lbs Today's date: 04/27/2021 Total lbs lost to date: 37 Total lbs lost since last in-office visit: 4  Interim History: Pt recently got over a cold- was seen by PCP 04/15/21 and prescribed abx for 10 days and still feels tired. She is eating healthier overall and less snacking.  Subjective:   1. Type 2 diabetes mellitus with other specified complication, with long-term current use of insulin (HCC) FBS= 83, 89. Pt reports no low BS's. We increased Ozempic dose last OV. Medication: Farxiga, Ozempic, Humalog, Toujeo (down to 20 units qhs)  2. Acute on chronic constipation, unspecified constipation type Symptoms well controlled. Pt only needs [name] a couple of times a month now. She has BM every day.  3. Vitamin D deficiency Discussed labs with patient today. Leslie Davis is on OTC Vit D 1,000 IU QD.  4. Hypertension associated with type 2 diabetes mellitus (HCC) BP was 141/99 at home yesterday, but is usually in the 130's/70's. Pt denies dizziness, chest pain, or SOB. Medication: Norvasc, Edarbi    Assessment/Plan:  No orders of the defined types were placed in this encounter.   Medications Discontinued During This Encounter  Medication Reason   insulin glargine, 1 Unit Dial, (TOUJEO SOLOSTAR) 300 UNIT/ML Solostar Pen    Semaglutide, 2 MG/DOSE, 8 MG/3ML SOPN Reorder   insulin glargine, 1 Unit Dial, (TOUJEO SOLOSTAR) 300 UNIT/ML Solostar Pen    Cholecalciferol (D3-1000) 25 MCG (1000 UT) tablet      Meds ordered this encounter  Medications   Semaglutide, 2  MG/DOSE, 8 MG/3ML SOPN    Sig: Inject 2 mg as directed once a week.    Dispense:  9 mL    Refill:  0    90 d supply, NEEDS OV FOR RF once tolerates well, will inc to 90 day   insulin glargine, 1 Unit Dial, (TOUJEO SOLOSTAR) 300 UNIT/ML Solostar Pen    Sig: 18 units q hs     1. Type 2 diabetes mellitus with other specified complication, with long-term current use of insulin (HCC) Decrease Toujeo to 18 units (from 20 units) and pt will ween down to 15 units as tolerated. Continue Ozempic.  Refill- Semaglutide, 2 MG/DOSE, 8 MG/3ML SOPN; Inject 2 mg as directed once a week.  Dispense: 9 mL; Refill: 0  Decrease & Refill- insulin glargine, 1 Unit Dial, (TOUJEO SOLOSTAR) 300 UNIT/ML Solostar Pen; 18 units q hs  2. Acute on chronic constipation, unspecified constipation type Continue [name] prn.  3. Vitamin D deficiency Vit D level high normal at 82.1 recently. Discontinue all Vit D supplements. Recheck labs in 3 months.  4. Hypertension associated with type 2 diabetes mellitus ([place]) Continue current treatment plan and home blood pressure monitoring.  5. Obesity with current BMI of 29.5 Leslie Davis is currently in the action stage of change. As such, her goal is to continue with weight loss efforts. She has agreed to the Category 1 Plan.   Continue with exercise at the gym- if energy doesn't improved after this cold, over the next couple of days, follow  up with PCP.  Exercise goals:  As is  Behavioral modification strategies: no skipping meals.  Leslie Davis has agreed to follow-up with our clinic in 3 weeks. She was informed of the importance of frequent follow-up visits to maximize her success with intensive lifestyle modifications for her multiple health conditions.   Objective:   Blood pressure (!) 146/79, pulse (!) 57, temperature (!) 97.5 F (36.4 C), height 5\' 4"  (1.626 m), weight 171 lb (77.6 kg), SpO2 100 %. Body mass index is 29.35 kg/m.  General: Cooperative, alert, well  developed, in no acute distress. HEENT: Conjunctivae and lids unremarkable. Cardiovascular: Regular rhythm.  Lungs: Normal work of breathing. Neurologic: No focal deficits.   Lab Results  Component Value Date   CREATININE 1.17 (H) 02/26/2021   BUN 23 02/26/2021   NA 142 02/26/2021   K 4.6 02/26/2021   CL 102 02/26/2021   CO2 23 02/26/2021   Lab Results  Component Value Date   ALT 17 02/26/2021   AST 17 02/26/2021   ALKPHOS 142 (H) 02/26/2021   BILITOT <0.2 02/26/2021   Lab Results  Component Value Date   HGBA1C 6.1 (H) 02/26/2021   HGBA1C 6.3 (H) 10/22/2020   HGBA1C 6.8 (H) 08/25/2020   HGBA1C 8.7 (H) 05/28/2020   HGBA1C 8.8 (H) 02/21/2020   No results found for: INSULIN Lab Results  Component Value Date   TSH 1.790 07/23/2020   Lab Results  Component Value Date   CHOL 156 11/20/2020   HDL 61 11/20/2020   LDLCALC 78 11/20/2020   TRIG 89 11/20/2020   CHOLHDL 2.6 11/20/2020   Lab Results  Component Value Date   VD25OH 82.1 03/31/2021   VD25OH 102.0 (H) 11/20/2020   VD25OH 95.2 10/22/2020   Lab Results  Component Value Date   WBC 5.1 12/23/2020   HGB 11.2 (L) 12/23/2020   HCT 34.9 (L) 12/23/2020   MCV 85.1 12/23/2020   PLT 256.0 12/23/2020   Lab Results  Component Value Date   IRON 68 11/16/2017   TIBC 355 11/16/2017   FERRITIN 45.2 11/16/2017    Obesity Behavioral Intervention:   Approximately 15 minutes were spent on the discussion below.  ASK: We discussed the diagnosis of obesity with Leslie Davis today and Leslie Davis agreed to give [place] permission to discuss obesity behavioral modification therapy today.  ASSESS: Leslie Davis has the diagnosis of obesity and her BMI today is 29.5. Leslie Davis is in the action stage of change.   ADVISE: Leslie Davis was educated on the multiple health risks of obesity as well as the benefit of weight loss to improve her health. She was advised of the need for long term treatment and the importance of lifestyle modifications to  improve her current health and to decrease her risk of future health problems.  AGREE: Multiple dietary modification options and treatment options were discussed and Leslie Davis agreed to follow the recommendations documented in the above note.  ARRANGE: Leslie Davis was educated on the importance of frequent visits to treat obesity as outlined per CMS and USPSTF guidelines and agreed to schedule her next follow up appointment today.  Attestation Statements:   Reviewed by clinician on day of visit: allergies, medications, problem list, medical history, surgical history, family history, social history, and previous encounter notes.  Coral Ceo, CMA, am acting as transcriptionist for [employer], DO.  I have reviewed the above documentation for accuracy and completeness, and I agree with the above. Marjory Sneddon, D.O.  The [place]  was signed into law in 2016 which includes the topic of electronic health records.  This provides immediate access to information in MyChart.  This includes consultation notes, operative notes, office notes, lab results and pathology reports.  If you have any questions about what you read please let us know at your next visit so we can discuss your concerns and take corrective action if need be.  We are right here with you.

## 2021-04-29 ENCOUNTER — Ambulatory Visit (INDEPENDENT_AMBULATORY_CARE_PROVIDER_SITE_OTHER): Payer: Medicare Other

## 2021-04-29 ENCOUNTER — Telehealth: Payer: Medicare Other

## 2021-04-29 DIAGNOSIS — I129 Hypertensive chronic kidney disease with stage 1 through stage 4 chronic kidney disease, or unspecified chronic kidney disease: Secondary | ICD-10-CM

## 2021-04-29 DIAGNOSIS — M1712 Unilateral primary osteoarthritis, left knee: Secondary | ICD-10-CM

## 2021-04-29 DIAGNOSIS — E1122 Type 2 diabetes mellitus with diabetic chronic kidney disease: Secondary | ICD-10-CM

## 2021-04-29 DIAGNOSIS — N182 Chronic kidney disease, stage 2 (mild): Secondary | ICD-10-CM

## 2021-04-29 NOTE — Patient Instructions (Signed)
Visit Information  Thank you for taking time to visit with me today. Please don't hesitate to contact me if I can be of assistance to you before our next scheduled telephone appointment.  Following are the goals we discussed today:  (Copy and paste patient goals from clinical care plan here)  Our next appointment is by telephone on 05/14/21 at 2:05 PM   Please call the care guide team at [phone] if you need to cancel or reschedule your appointment.   If you are experiencing a Mental Health or [place] or need someone to talk to, please call 1-800-273-TALK (toll free, 24 hour hotline)   The patient verbalized understanding of instructions, educational materials, and care plan provided today and agreed to receive a mailed copy of patient instructions, educational materials, and care plan.   Barb Merino, RN, BSN, CCM Care Management Coordinator [facility]  Direct Phone: [phone]

## 2021-04-29 NOTE — Chronic Care Management (AMB) (Signed)
Chronic Care Management   CCM RN Visit Note  04/29/2021 Name: Leslie Davis MRN: [id number] DOB: [birth year]  Subjective: Leslie Davis is a 66 y.o. year old female who is a primary care patient of Dorothyann Peng, MD. The care management team was consulted for assistance with disease management and care coordination needs.    Engaged with patient by telephone for follow up visit in response to provider referral for case management and/or care coordination services.   Consent to Services:  The patient was given information about Chronic Care Management services, agreed to services, and gave verbal consent prior to initiation of services.  Please see initial visit note for detailed documentation.   Patient agreed to services and verbal consent obtained.   Assessment: Review of patient past medical history, allergies, medications, health status, including review of consultants reports, laboratory and other test data, was performed as part of comprehensive evaluation and provision of chronic care management services.   SDOH (Social Determinants of Health) assessments and interventions performed:    CCM Care Plan  Allergies  Allergen Reactions   Fiasp [Insulin Aspart (W-Niacinamide)] Hives    Outpatient Encounter Medications as of 04/29/2021  Medication Sig   amLODipine (NORVASC) 10 MG tablet TAKE ONE TABLET BY MOUTH ONCE DAILY   aspirin EC 81 MG tablet Take 1 tablet (81 mg total) by mouth 2 (two) times daily. To be taken after surgery   Azilsartan Medoxomil (EDARBI) 80 MG TABS TAKE ONE TABLET BY MOUTH ONCE DAILY   Blood Glucose Monitoring Suppl (ONETOUCH VERIO) w/Device KIT Use as directed to check blood sugars 2 times per day dx: e11.65   cetirizine (ZYRTEC) 10 MG tablet Take 1 tablet (10 mg total) by mouth daily.   dapagliflozin propanediol (FARXIGA) 10 MG TABS tablet Take 1 tablet (10 mg total) by mouth daily before breakfast.   ferrous gluconate (FERGON) 324 MG tablet TAKE 1  TABLET BY MOUTH TWICE DAILY WITH A MEAL. (Patient taking differently: Take 324 mg by mouth 2 (two) times daily with a meal.)   glucose blood (ONETOUCH VERIO) test strip Use as instructed to check blood sugars 2 times per day dx:e11.65   ibuprofen (ADVIL) 800 MG tablet Take 1 tablet (800 mg total) by mouth every 8 (eight) hours as needed.   insulin glargine, 1 Unit Dial, (TOUJEO SOLOSTAR) 300 UNIT/ML Solostar Pen 18 units q hs   insulin lispro (HUMALOG KWIKPEN) 100 UNIT/ML KwikPen Sliding scale   linaclotide ([name]) 145 MCG CAPS capsule Take 1 capsule (145 mcg total) by mouth daily.   OneTouch Delica Lancets 33G MISC Use as directed to check blood sugars 2 times per day dx:e11.65   pantoprazole (PROTONIX) 40 MG tablet Take one tablet by mouth 3 days a week   rosuvastatin (CRESTOR) 20 MG tablet TAKE ONE TABLET BY MOUTH ONCE DAILY   Semaglutide, 2 MG/DOSE, 8 MG/3ML SOPN Inject 2 mg as directed once a week.   temazepam (RESTORIL) 30 MG capsule TAKE ONE CAPSULE BY MOUTH EVERYDAY AT BEDTIME AS NEEDED   traMADol (ULTRAM) 50 MG tablet TAKE 1 TO 2 TABLETS BY MOUTH daily AS NEEDED   TRUEPLUS PEN NEEDLES 31G X 6 MM MISC USE WITH pen TO INJECT insulin DAILY   No facility-administered encounter medications on file as of 04/29/2021.    Patient Active Problem List   Diagnosis Date Noted   Class 2 severe obesity with serious comorbidity and body mass index (BMI) of 35.0 to 35.9 in adult [facility]) 12/01/2020  Status post total left knee replacement 10/27/2020   Primary osteoarthritis of left knee    NAFLD (nonalcoholic fatty liver disease) 08/06/2020   Other fatigue 07/23/2020   SOBOE (shortness of breath on exertion) 07/23/2020   Diabetes mellitus ([place]) 07/23/2020   Hypertension associated with type 2 diabetes mellitus ([place]) 07/23/2020   Hyperlipidemia associated with type 2 diabetes mellitus ([place]) 07/23/2020   Absolute anemia 07/23/2020   Vitamin D deficiency 07/23/2020   At risk for heart disease  07/23/2020   AKI (acute kidney injury) ([place]) 09/07/2019   Acute kidney injury ([place]) 09/06/2019   Bradycardia 09/06/2019   Hyperglycemia 09/06/2019   HLD (hyperlipidemia)    Hypertensive nephropathy 05/16/2019   Erosive gastritis 11/16/2017   Fatty liver 11/16/2017   Abdominal pain 12/11/2012   Early satiety 12/11/2012   Hiatal hernia    Hx of cholecystectomy 02/05/2011   Flu-like symptoms 02/05/2011   CONSTIPATION 08/27/2008   FATTY LIVER DISEASE 05/07/2008   Nausea with vomiting 04/23/2008   EPIGASTRIC PAIN 04/23/2008   RENAL CALCULUS 04/22/2008   Type 2 diabetes mellitus with stage 2 chronic kidney disease, with long-term current use of insulin ([place]) 05/19/2006   Type 2 diabetes mellitus with hyperlipidemia ([place]) 05/19/2006   HYPERTENSION, BENIGN SYSTEMIC 05/19/2006   GASTROESOPHAGEAL REFLUX, NO ESOPHAGITIS 05/19/2006   AMENORRHEA 05/19/2006    Conditions to be addressed/monitored: Type 2 diabetes mellitus, Hypertensive nephropathy, Chronic renal failure, Primary Osteoarthritis   Care Plan : RN Care Manager Plan of Care  Updates made by Lynne Logan, RN since 04/29/2021 12:00 AM     Problem: No Plan of Care Established for management of chronic disease states (Type 2 diabetes mellitus, Hypertensive nephropathy, Chronic renal failure, Primary Osteoarthritis)   Priority: High     Long-Range Goal: Establishment of Plan of Care for chronic disease states (Type 2 diabetes mellitus, Hypertensive nephropathy, Chronic renal failure, Primary Osteoarthritis)   Start Date: 04/07/2021  Expected End Date: 04/07/2022  Priority: High  Note:   Current Barriers:  Knowledge Deficits related to plan of care for management of Type 2 diabetes mellitus, Hypertensive nephropathy, Chronic renal failure, Primary Osteoarthritis   Chronic Disease Management support and education needs related to Type 2 diabetes mellitus, Hypertensive nephropathy, Chronic renal failure, Primary Osteoarthritis     RNCM Clinical Goal(s):  Patient will verbalize basic understanding of  Type 2 diabetes mellitus, Hypertensive nephropathy, Chronic renal failure, Primary Osteoarthritis  disease process and self health management plan as evidenced by patient will report having no disease exacerbations related to her chronic disease states as listed above  take all medications exactly as prescribed and will call provider for medication related questions as evidenced by patient will report having no missed doses of her prescribed medications  demonstrate Ongoing health management independence as evidenced by patient will report 100% adherence to her prescribed treatment plan  continue to work with RN Care Manager to address care management and care coordination needs related to  Type 2 diabetes mellitus, Hypertensive nephropathy, Chronic renal failure, Primary Osteoarthritis  as evidenced by adherence to CM Team Scheduled appointments demonstrate ongoing self health care management ability   as evidenced by    through collaboration with RN Care manager, provider, and care team.   Interventions: 1:1 collaboration with primary care provider regarding development and update of comprehensive plan of care as evidenced by provider attestation and co-signature Inter-disciplinary care team collaboration (see longitudinal plan of care) Evaluation of current treatment plan related to  self management and patient's  adherence to plan as established by provider    Chronic Kidney Disease Interventions:  (Status:  Condition stable.  Not addressed this visit.) Long Term Goal Assessed the Patient understanding of chronic kidney disease    Evaluation of current treatment plan related to chronic kidney disease self management and patient's adherence to plan as established by provider      Reviewed prescribed diet increase daily water intake to 64 oz daily Provided education on kidney disease progression    Discussed plans with  patient for ongoing care management follow up and provided patient with direct contact information for care management team Last practice recorded BP readings:  BP Readings from Last 3 Encounters:  03/31/21 (!) 147/77  03/04/21 (!) 150/80  02/26/21 (!) 156/82  Most recent eGFR/CrCl:  Lab Results  Component Value Date   EGFR 51 (L) 02/26/2021    No components found for: CRCL  Diabetes Interventions:  (Status:  Goal on track:  Yes.) Long Term Goal Received inbound call from patient to discuss concerns related to patient reports over the past week she began experiencing severe fatigue Assessed for other signs/symptoms suggestive of infection, patient denies Discussed patient completed the antibiotics prescribed in late January by Dr. Baird Cancer, she feels her URI has resolved Reviewed and discussed recent cbg's have been in the 37's Collaborated with Dr. Baird Cancer regarding patient's symptoms, instructed patient to decrease her insulin by 3 units per Dr. Baird Cancer Educated patient on the importance of staying well hydrated by drinking plenty of water and balancing her activity with rest Discussed patient's fatigue may be a lingering effect from her recent URI and may resolve over the next 5-7 days Instructed patient to keep this RN CM and or PCP well informed of how she is feeling over the upcoming week and to call back if needed before then to report persistent or worsening symptoms  Discussed plans with patient for ongoing care management follow up and provided patient with direct contact information for care management team Lab Results  Component Value Date   HGBA1C 6.1 (H) 02/26/2021  Hypertension Interventions:  (Status:  Condition stable.  Not addressed this visit.) Long Term Goal Last practice recorded BP readings:  BP Readings from Last 3 Encounters:  03/31/21 (!) 147/77  03/04/21 (!) 150/80  02/26/21 (!) 156/82  Most recent eGFR/CrCl:  Lab Results  Component Value Date   EGFR 51 (L)  02/26/2021    No components found for: CRCL Evaluation of current treatment plan related to hypertension self management and patient's adherence to plan as established by provider Counseled on the importance of exercise goals with target of 150 minutes per week Provided education on prescribed diet low Sodium Discussed plans with patient for ongoing care management follow up and provided patient with direct contact information for care management team  Pain Interventions:  (Status:  Condition stable.  Not addressed this visit.) Long Term Goal Pain assessment performed Medications reviewed Reviewed provider established plan for pain management Discussed importance of adherence to all scheduled medical appointments Counseled on the importance of reporting any/all new or changed pain symptoms or management strategies to pain management provider Advised patient to report to care team affect of pain on daily activities Discussed use of relaxation techniques and/or diversional activities to assist with pain reduction (distraction, imagery, relaxation, massage, acupressure, TENS, heat, and cold application Reviewed with patient prescribed pharmacological and nonpharmacological pain relief strategies Determined patient has completed outpatient PT following a right total Knee replacement, she continues to exercise  3 x per week at a local gym with good results  Discussed plans with patient for ongoing care management follow up and provided patient with direct contact information for care management team  Patient Goals/Self-Care Activities: Take all medications as prescribed Attend all scheduled provider appointments Call pharmacy for medication refills 3-7 days in advance of running out of medications Perform IADL's (shopping, preparing meals, housekeeping, managing finances) independently Call provider office for new concerns or questions  check blood sugar at prescribed times: before meals and at  bedtime, when you have symptoms of low or high blood sugar, and before and after exercise drink 6 to 8 glasses of water each day manage portion size  Follow Up Plan:  Telephone follow up appointment with care management team member scheduled for:  05/14/21     Plan:Telephone follow up appointment with care management team member scheduled for:  05/14/21  Barb Merino, RN, BSN, CCM Care Management Coordinator [facility]  Direct Phone: [phone]

## 2021-04-30 ENCOUNTER — Encounter: Payer: Self-pay | Admitting: Internal Medicine

## 2021-05-05 ENCOUNTER — Ambulatory Visit (INDEPENDENT_AMBULATORY_CARE_PROVIDER_SITE_OTHER): Payer: Medicare Other | Admitting: Orthopaedic Surgery

## 2021-05-05 ENCOUNTER — Ambulatory Visit: Payer: Self-pay

## 2021-05-05 ENCOUNTER — Other Ambulatory Visit: Payer: Self-pay

## 2021-05-05 ENCOUNTER — Encounter: Payer: Self-pay | Admitting: Orthopaedic Surgery

## 2021-05-05 DIAGNOSIS — Z96652 Presence of left artificial knee joint: Secondary | ICD-10-CM

## 2021-05-05 MED ORDER — TRAMADOL HCL 50 MG PO TABS: 50.0000 mg | ORAL_TABLET | Freq: Two times a day (BID) | ORAL | 2 refills | Status: DC | PRN

## 2021-05-05 MED ORDER — IBUPROFEN 800 MG PO TABS: 800.0000 mg | ORAL_TABLET | Freq: Three times a day (TID) | ORAL | 0 refills | Status: DC | PRN

## 2021-05-05 NOTE — Progress Notes (Signed)
Post-Op Visit Note   Patient: Leslie Davis           Date of Birth: 08-22-1955           MRN: [id number] Visit Date: 05/05/2021 PCP: [name], MD   Assessment & Plan:  Chief Complaint:  Chief Complaint  Patient presents with   Left Knee - Follow-up    Left total knee arthroplasty 10/27/2020   Visit Diagnoses:  1. Status post total left knee replacement     Plan: Leslie Davis is a pleasant 66 year old female who comes in today approximately 6 months status post left total knee replacement 10/27/2020.  She continues to endorse pain but does note that this has improved with time.  She has been taking tramadol and ibuprofen which helps.  She is still working on range of motion exercises at home.  Examination of her left knee reveals range of motion from 0 to approximately 110 degrees.  She is stable to valgus varus stress.  She is neurovascular intact distally.  At this point, would like for her to continue working on range of motion exercises.  I refilled her medications.  Dental prophylaxis reinforced.  Follow-up with [place] in 3 months time for repeat evaluation and 2 view x-rays of the left knee.  Call with concerns or questions.  Follow-Up Instructions: Return in about 3 months (around 08/02/2021).   Orders:  Orders Placed This Encounter  Procedures   XR Knee 1-2 Views Left   Meds ordered this encounter  Medications   traMADol (ULTRAM) 50 MG tablet    Sig: Take 1 tablet (50 mg total) by mouth every 12 (twelve) hours as needed.    Dispense:  30 tablet    Refill:  2   ibuprofen (ADVIL) 800 MG tablet    Sig: Take 1 tablet (800 mg total) by mouth every 8 (eight) hours as needed.    Dispense:  30 tablet    Refill:  0    Imaging: Well-seated prosthesis without complication  PMFS History: Patient Active Problem List   Diagnosis Date Noted   Class 2 severe obesity with serious comorbidity and body mass index (BMI) of 35.0 to 35.9 in adult ([place]) 12/01/2020   Status post total  left knee replacement 10/27/2020   Primary osteoarthritis of left knee    NAFLD (nonalcoholic fatty liver disease) 08/06/2020   Other fatigue 07/23/2020   SOBOE (shortness of breath on exertion) 07/23/2020   Diabetes mellitus ([place]) 07/23/2020   Hypertension associated with type 2 diabetes mellitus ([place]) 07/23/2020   Hyperlipidemia associated with type 2 diabetes mellitus ([place]) 07/23/2020   Absolute anemia 07/23/2020   Vitamin D deficiency 07/23/2020   At risk for heart disease 07/23/2020   AKI (acute kidney injury) ([place]) 09/07/2019   Acute kidney injury ([place]) 09/06/2019   Bradycardia 09/06/2019   Hyperglycemia 09/06/2019   HLD (hyperlipidemia)    Hypertensive nephropathy 05/16/2019   Erosive gastritis 11/16/2017   Fatty liver 11/16/2017   Abdominal pain 12/11/2012   Early satiety 12/11/2012   Hiatal hernia    Hx of cholecystectomy 02/05/2011   Flu-like symptoms 02/05/2011   CONSTIPATION 08/27/2008   FATTY LIVER DISEASE 05/07/2008   Nausea with vomiting 04/23/2008   EPIGASTRIC PAIN 04/23/2008   RENAL CALCULUS 04/22/2008   Type 2 diabetes mellitus with stage 2 chronic kidney disease, with long-term current use of insulin ([place]) 05/19/2006   Type 2 diabetes mellitus with hyperlipidemia ([place]) 05/19/2006   HYPERTENSION, BENIGN SYSTEMIC 05/19/2006   GASTROESOPHAGEAL  REFLUX, NO ESOPHAGITIS 05/19/2006   AMENORRHEA 05/19/2006   Past Medical History:  Diagnosis Date   DM (diabetes mellitus) ([place])    Fatty liver disease, nonalcoholic    Gastritis    Gastroparesis    GERD (gastroesophageal reflux disease)    Heartburn    Hiatal hernia    HLD (hyperlipidemia)    HTN (hypertension)    Joint pain    Renal calculus    Rheumatoid arthritis ([place])    Vitamin D deficiency     Family History  Problem Relation Age of Onset   Diabetes Mother    Thyroid disease Mother    Kidney disease Mother    Diabetes Maternal Grandmother    Stomach cancer Maternal Aunt        X 2 aunts    Hypertension Father    Hyperlipidemia Father    Sudden death Father    Stroke Father    Colon cancer Neg Hx    Esophageal cancer Neg Hx    Rectal cancer Neg Hx     Past Surgical History:  Procedure Laterality Date   CHOLECYSTECTOMY     COLONOSCOPY     TOTAL KNEE ARTHROPLASTY Left 10/27/2020   Procedure: LEFT TOTAL KNEE ARTHROPLASTY;  Surgeon: [name], MD;  Location: [facility];  Service: Orthopedics;  Laterality: Left;   TUBAL LIGATION     UPPER GASTROINTESTINAL ENDOSCOPY     Social History   Occupational History   Occupation: Housekeeping    Comment: Estate manager/land agent  Tobacco Use   Smoking status: Former    Packs/day: 0.25    Years: 1.00    Pack years: 0.25    Types: Cigarettes    Quit date: 09/10/1974    Years since quitting: 46.6   Smokeless tobacco: Never   Tobacco comments:    she no longer smokes.   Vaping Use   Vaping Use: Never used  Substance and Sexual Activity   Alcohol use: No   Drug use: No   Sexual activity: Not Currently

## 2021-05-11 ENCOUNTER — Encounter (HOSPITAL_COMMUNITY): Payer: Self-pay | Admitting: Oncology

## 2021-05-11 ENCOUNTER — Emergency Department (HOSPITAL_COMMUNITY): Payer: Medicare Other

## 2021-05-11 ENCOUNTER — Emergency Department (HOSPITAL_COMMUNITY)
Admission: EM | Admit: 2021-05-11 | Discharge: 2021-05-11 | Disposition: A | Discharge status: Home / Self Care | Payer: Medicare Other | Attending: Emergency Medicine | Admitting: Emergency Medicine

## 2021-05-11 ENCOUNTER — Other Ambulatory Visit: Payer: Self-pay

## 2021-05-11 DIAGNOSIS — R0789 Other chest pain: Secondary | ICD-10-CM | POA: Insufficient documentation

## 2021-05-11 DIAGNOSIS — M47812 Spondylosis without myelopathy or radiculopathy, cervical region: Secondary | ICD-10-CM | POA: Diagnosis not present

## 2021-05-11 DIAGNOSIS — S0990XA Unspecified injury of head, initial encounter: Secondary | ICD-10-CM | POA: Insufficient documentation

## 2021-05-11 DIAGNOSIS — S92511D Displaced fracture of proximal phalanx of right lesser toe(s), subsequent encounter for fracture with routine healing: Secondary | ICD-10-CM | POA: Diagnosis not present

## 2021-05-11 DIAGNOSIS — Z79899 Other long term (current) drug therapy: Secondary | ICD-10-CM | POA: Insufficient documentation

## 2021-05-11 DIAGNOSIS — M79673 Pain in unspecified foot: Secondary | ICD-10-CM | POA: Diagnosis not present

## 2021-05-11 DIAGNOSIS — Y92410 Unspecified street and highway as the place of occurrence of the external cause: Secondary | ICD-10-CM | POA: Insufficient documentation

## 2021-05-11 DIAGNOSIS — R404 Transient alteration of awareness: Secondary | ICD-10-CM | POA: Diagnosis not present

## 2021-05-11 DIAGNOSIS — S99921A Unspecified injury of right foot, initial encounter: Secondary | ICD-10-CM

## 2021-05-11 DIAGNOSIS — I10 Essential (primary) hypertension: Secondary | ICD-10-CM | POA: Diagnosis not present

## 2021-05-11 DIAGNOSIS — R410 Disorientation, unspecified: Secondary | ICD-10-CM | POA: Diagnosis not present

## 2021-05-11 DIAGNOSIS — Z7982 Long term (current) use of aspirin: Secondary | ICD-10-CM | POA: Diagnosis not present

## 2021-05-11 DIAGNOSIS — R0781 Pleurodynia: Secondary | ICD-10-CM | POA: Diagnosis not present

## 2021-05-11 DIAGNOSIS — S299XXA Unspecified injury of thorax, initial encounter: Secondary | ICD-10-CM | POA: Diagnosis not present

## 2021-05-11 DIAGNOSIS — S161XXA Strain of muscle, fascia and tendon at neck level, initial encounter: Secondary | ICD-10-CM | POA: Diagnosis not present

## 2021-05-11 DIAGNOSIS — M542 Cervicalgia: Secondary | ICD-10-CM | POA: Diagnosis not present

## 2021-05-11 DIAGNOSIS — Z743 Need for continuous supervision: Secondary | ICD-10-CM | POA: Diagnosis not present

## 2021-05-11 DIAGNOSIS — R6889 Other general symptoms and signs: Secondary | ICD-10-CM | POA: Diagnosis not present

## 2021-05-11 DIAGNOSIS — Z794 Long term (current) use of insulin: Secondary | ICD-10-CM | POA: Insufficient documentation

## 2021-05-11 MED ORDER — METHOCARBAMOL 500 MG PO TABS: 500.0000 mg | ORAL_TABLET | Freq: Two times a day (BID) | ORAL | 0 refills | Status: DC

## 2021-05-11 MED ORDER — IBUPROFEN 800 MG PO TABS
800.0000 mg | ORAL_TABLET | Freq: Once | ORAL | Status: AC
  Administered 2021-05-11: 800 mg via ORAL
  Filled 2021-05-11: qty 1

## 2021-05-11 MED ORDER — HYDRALAZINE HCL 25 MG PO TABS
25.0000 mg | ORAL_TABLET | Freq: Once | ORAL | Status: AC
  Administered 2021-05-11: 25 mg via ORAL
  Filled 2021-05-11: qty 1

## 2021-05-11 MED ORDER — ACETAMINOPHEN 325 MG PO TABS
650.0000 mg | ORAL_TABLET | Freq: Once | ORAL | Status: AC
  Administered 2021-05-11: 650 mg via ORAL
  Filled 2021-05-11: qty 2

## 2021-05-11 NOTE — ED Notes (Signed)
Patient transported to X-ray 
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
## 2021-05-11 NOTE — ED Notes (Signed)
Patient transported to CT 
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
## 2021-05-14 ENCOUNTER — Telehealth: Payer: Self-pay

## 2021-05-14 ENCOUNTER — Telehealth: Payer: Medicare Other

## 2021-05-14 NOTE — Telephone Encounter (Signed)
°  Care Management   Follow Up Note   05/14/2021 Name: Leslie Davis MRN: [id number] DOB: 12-Nov-1955   Referred by: [name], MD Reason for referral : Chronic Care Management (RN CM Follow up call )   An unsuccessful telephone outreach was attempted today. The patient was referred to the case management team for assistance with care management and care coordination.   Follow Up Plan: A HIPPA compliant phone message was left for the patient providing contact information and requesting a return call.   Barb Merino, RN, BSN, CCM Care Management Coordinator [facility]  Direct Phone: [phone]

## 2021-05-19 DIAGNOSIS — Z794 Long term (current) use of insulin: Secondary | ICD-10-CM | POA: Diagnosis not present

## 2021-05-19 DIAGNOSIS — I129 Hypertensive chronic kidney disease with stage 1 through stage 4 chronic kidney disease, or unspecified chronic kidney disease: Secondary | ICD-10-CM | POA: Diagnosis not present

## 2021-05-19 DIAGNOSIS — N182 Chronic kidney disease, stage 2 (mild): Secondary | ICD-10-CM | POA: Diagnosis not present

## 2021-05-19 DIAGNOSIS — E1122 Type 2 diabetes mellitus with diabetic chronic kidney disease: Secondary | ICD-10-CM | POA: Diagnosis not present

## 2021-05-25 ENCOUNTER — Encounter (INDEPENDENT_AMBULATORY_CARE_PROVIDER_SITE_OTHER): Payer: Self-pay | Admitting: Family Medicine

## 2021-05-25 ENCOUNTER — Ambulatory Visit (INDEPENDENT_AMBULATORY_CARE_PROVIDER_SITE_OTHER): Payer: Medicare Other | Admitting: Family Medicine

## 2021-05-25 ENCOUNTER — Other Ambulatory Visit: Payer: Self-pay

## 2021-05-25 ENCOUNTER — Telehealth: Payer: Self-pay

## 2021-05-25 VITALS — BP 184/81 | HR 51 | Temp 97.8°F | Ht 64.0 in | Wt 167.0 lb

## 2021-05-25 DIAGNOSIS — K5909 Other constipation: Secondary | ICD-10-CM | POA: Insufficient documentation

## 2021-05-25 DIAGNOSIS — E1169 Type 2 diabetes mellitus with other specified complication: Secondary | ICD-10-CM | POA: Diagnosis not present

## 2021-05-25 DIAGNOSIS — Z794 Long term (current) use of insulin: Secondary | ICD-10-CM

## 2021-05-25 DIAGNOSIS — E669 Obesity, unspecified: Secondary | ICD-10-CM

## 2021-05-25 DIAGNOSIS — Z6835 Body mass index (BMI) 35.0-35.9, adult: Secondary | ICD-10-CM

## 2021-05-25 DIAGNOSIS — E1159 Type 2 diabetes mellitus with other circulatory complications: Secondary | ICD-10-CM

## 2021-05-25 DIAGNOSIS — I152 Hypertension secondary to endocrine disorders: Secondary | ICD-10-CM

## 2021-05-25 DIAGNOSIS — Z6828 Body mass index (BMI) 28.0-28.9, adult: Secondary | ICD-10-CM

## 2021-05-25 MED ORDER — LINACLOTIDE 145 MCG PO CAPS: 145.0000 ug | ORAL_CAPSULE | Freq: Every day | ORAL | 0 refills | Status: DC
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
## 2021-05-26 ENCOUNTER — Other Ambulatory Visit: Payer: Self-pay

## 2021-05-26 MED ORDER — DAPAGLIFLOZIN PROPANEDIOL 10 MG PO TABS: 10.0000 mg | ORAL_TABLET | Freq: Every day | ORAL | 1 refills | Status: DC
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
## 2021-05-28 ENCOUNTER — Encounter: Payer: Self-pay | Admitting: Internal Medicine

## 2021-05-28 ENCOUNTER — Other Ambulatory Visit: Payer: Self-pay

## 2021-05-28 ENCOUNTER — Ambulatory Visit (INDEPENDENT_AMBULATORY_CARE_PROVIDER_SITE_OTHER): Payer: Medicare Other | Admitting: Internal Medicine

## 2021-05-28 VITALS — BP 116/76 | HR 58 | Temp 97.8°F | Ht 64.0 in | Wt 172.6 lb

## 2021-05-28 DIAGNOSIS — Z2821 Immunization not carried out because of patient refusal: Secondary | ICD-10-CM | POA: Diagnosis not present

## 2021-05-28 DIAGNOSIS — Z794 Long term (current) use of insulin: Secondary | ICD-10-CM | POA: Diagnosis not present

## 2021-05-28 DIAGNOSIS — Z6829 Body mass index (BMI) 29.0-29.9, adult: Secondary | ICD-10-CM

## 2021-05-28 DIAGNOSIS — M25562 Pain in left knee: Secondary | ICD-10-CM | POA: Diagnosis not present

## 2021-05-28 DIAGNOSIS — I129 Hypertensive chronic kidney disease with stage 1 through stage 4 chronic kidney disease, or unspecified chronic kidney disease: Secondary | ICD-10-CM | POA: Diagnosis not present

## 2021-05-28 DIAGNOSIS — N1831 Chronic kidney disease, stage 3a: Secondary | ICD-10-CM

## 2021-05-28 DIAGNOSIS — E1122 Type 2 diabetes mellitus with diabetic chronic kidney disease: Secondary | ICD-10-CM
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
## 2021-05-28 NOTE — Patient Instructions (Signed)

## 2021-06-01 ENCOUNTER — Telehealth: Payer: Self-pay

## 2021-06-01 ENCOUNTER — Telehealth: Payer: Commercial Managed Care - HMO

## 2021-06-01 DIAGNOSIS — N1831 Chronic kidney disease, stage 3a: Secondary | ICD-10-CM | POA: Diagnosis not present

## 2021-06-01 DIAGNOSIS — E1122 Type 2 diabetes mellitus with diabetic chronic kidney disease: Secondary | ICD-10-CM | POA: Diagnosis not present

## 2021-06-01 DIAGNOSIS — E785 Hyperlipidemia, unspecified: Secondary | ICD-10-CM | POA: Diagnosis not present

## 2021-06-01 DIAGNOSIS — N189 Chronic kidney disease, unspecified: Secondary | ICD-10-CM | POA: Diagnosis not present

## 2021-06-01 DIAGNOSIS — M1990 Unspecified osteoarthritis, unspecified site: Secondary | ICD-10-CM | POA: Diagnosis not present

## 2021-06-01 DIAGNOSIS — I129 Hypertensive chronic kidney disease with stage 1 through stage 4 chronic kidney disease, or unspecified chronic kidney disease: Secondary | ICD-10-CM | POA: Diagnosis not present

## 2021-06-01 LAB — CMP14+EGFR
ALT: 30 IU/L (ref 0–32)
AST: 24 IU/L (ref 0–40)
Albumin/Globulin Ratio: 1.7 (ref 1.2–2.2)
Albumin: 4.5 g/dL (ref 3.8–4.8)
Alkaline Phosphatase: 128 IU/L — ABNORMAL HIGH (ref 44–121)
BUN/Creatinine Ratio: 19 (ref 12–28)
BUN: 20 mg/dL (ref 8–27)
Bilirubin Total: 0.2 mg/dL (ref 0.0–1.2)
CO2: 20 mmol/L (ref 20–29)
Calcium: 10 mg/dL (ref 8.7–10.3)
Chloride: 104 mmol/L (ref 96–106)
Creatinine, Ser: 1.08 mg/dL — ABNORMAL HIGH (ref 0.57–1.00)
Globulin, Total: 2.7 g/dL (ref 1.5–4.5)
Glucose: 79 mg/dL (ref 70–99)
Potassium: 4.9 mmol/L (ref 3.5–5.2)
Sodium: 141 mmol/L (ref 134–144)
Total Protein: 7.2 g/dL (ref 6.0–8.5)
eGFR: 56 mL/min/{1.73_m2} — ABNORMAL LOW (ref >59)

## 2021-06-01 LAB — PROTEIN ELECTROPHORESIS, SERUM
A/G Ratio: 1 (ref 0.7–1.7)
Albumin ELP: 3.6 g/dL (ref 2.9–4.4)
Alpha 1: 0.3 g/dL (ref 0.0–0.4)
Alpha 2: 0.8 g/dL (ref 0.4–1.0)
Beta: 1.1 g/dL (ref 0.7–1.3)
Gamma Globulin: 1.4 g/dL (ref 0.4–1.8)
Globulin, Total: 3.6 g/dL (ref 2.2–3.9)

## 2021-06-01 LAB — HEMOGLOBIN A1C
Est. average glucose Bld gHb Est-mCnc: 131 mg/dL
Hgb A1c MFr Bld: 6.2 % — ABNORMAL HIGH (ref 4.8–5.6)

## 2021-06-01 LAB — PTH, INTACT AND CALCIUM: PTH: 28 pg/mL (ref 15–65)

## 2021-06-01 LAB — PHOSPHORUS: Phosphorus: 3.4 mg/dL (ref 3.0–4.3)

## 2021-06-01 NOTE — Telephone Encounter (Signed)
?  Care Management  ? ?Follow Up Note ? ? ?06/01/2021 ?Name: Leslie Davis MRN: [id number] DOB: [birth year] ? ? ?Referred by: [name], MD ?Reason for referral : Chronic Care Management (RN CM Follow up call - #2 ) ? ? ?A second unsuccessful telephone outreach was attempted today. The patient was referred to the case management team for assistance with care management and care coordination.  ? ?Follow Up Plan: A HIPPA compliant phone message was left for the patient providing contact information and requesting a return call.  ? ?Barb Merino, RN, BSN, CCM ?Care Management Coordinator ?[place] [facility]  ?Direct Phone: [phone] ? ? ?

## 2021-06-03 DIAGNOSIS — I129 Hypertensive chronic kidney disease with stage 1 through stage 4 chronic kidney disease, or unspecified chronic kidney disease: Secondary | ICD-10-CM | POA: Diagnosis not present

## 2021-06-03 DIAGNOSIS — N1831 Chronic kidney disease, stage 3a: Secondary | ICD-10-CM | POA: Diagnosis not present

## 2021-06-03 DIAGNOSIS — R809 Proteinuria, unspecified: Secondary | ICD-10-CM | POA: Diagnosis not present

## 2021-06-09 ENCOUNTER — Telehealth: Payer: Medicare Other

## 2021-06-09 ENCOUNTER — Telehealth: Payer: Self-pay

## 2021-06-09 NOTE — Telephone Encounter (Signed)
?  Care Management  ? ?Follow Up Note ? ? ?06/09/2021 ?Name: Leslie Davis MRN: [id number] DOB: 1956-02-18 ? ? ?Referred by: [name], MD ?Reason for referral : Chronic Care Management (RN CM Follow up call #3) ? ? ?An unsuccessful telephone outreach was attempted today. The patient was referred to the case management team for assistance with care management and care coordination.  ? ?Follow Up Plan: Telephone follow up appointment with care management team member scheduled for: 06/25/21 ? ?Barb Merino, RN, BSN, CCM ?Care Management Coordinator ?[place] [facility]  ?Direct Phone: [phone] ? ? ?

## 2021-06-17 ENCOUNTER — Encounter: Payer: Self-pay | Admitting: Internal Medicine

## 2021-06-17 ENCOUNTER — Ambulatory Visit (INDEPENDENT_AMBULATORY_CARE_PROVIDER_SITE_OTHER): Payer: Medicare Other | Admitting: Internal Medicine

## 2021-06-17 VITALS — BP 172/80 | HR 58 | Temp 97.6°F | Ht 64.0 in | Wt 167.8 lb

## 2021-06-17 DIAGNOSIS — Z794 Long term (current) use of insulin: Secondary | ICD-10-CM

## 2021-06-17 DIAGNOSIS — E1122 Type 2 diabetes mellitus with diabetic chronic kidney disease: Secondary | ICD-10-CM

## 2021-06-17 DIAGNOSIS — R5383 Other fatigue: Secondary | ICD-10-CM | POA: Diagnosis not present

## 2021-06-17 DIAGNOSIS — R400 Somnolence: Secondary | ICD-10-CM | POA: Diagnosis not present

## 2021-06-17 DIAGNOSIS — N1831 Chronic kidney disease, stage 3a: Secondary | ICD-10-CM | POA: Diagnosis not present

## 2021-06-17 DIAGNOSIS — Z79899 Other long term (current) drug therapy: Secondary | ICD-10-CM

## 2021-06-17 DIAGNOSIS — M62838 Other muscle spasm: Secondary | ICD-10-CM | POA: Diagnosis not present

## 2021-06-17 DIAGNOSIS — I129 Hypertensive chronic kidney disease with stage 1 through stage 4 chronic kidney disease, or unspecified chronic kidney disease: Secondary | ICD-10-CM

## 2021-06-17 DIAGNOSIS — Z6829 Body mass index (BMI) 29.0-29.9, adult: Secondary | ICD-10-CM

## 2021-06-17 MED ORDER — CYANOCOBALAMIN 1000 MCG/ML IJ SOLN
1000.0000 ug | Freq: Once | INTRAMUSCULAR | Status: AC
  Administered 2021-06-17: 1000 ug via INTRAMUSCULAR

## 2021-06-17 NOTE — Progress Notes (Signed)
?I,Leslie Davis,acting as a scribe for Leslie Greenland, MD.,have documented all relevant documentation on the behalf of Leslie Greenland, MD,as directed by  Leslie Greenland, MD while in the presence of Leslie Greenland, MD.  ?This visit occurred during the SARS-CoV-2 public health emergency.  Safety protocols were in place, including screening questions prior to the visit, additional usage of staff PPE, and extensive cleaning of exam room while observing appropriate contact time as indicated for disinfecting solutions. ? ?Subjective:  ?  ? Patient ID: Leslie Davis , female    DOB: 1955/06/24 , 66 y.o.   MRN: [id number] ? ? ?Chief Complaint  ?Patient presents with  ? Fatigue  ? ? ?HPI ? ?Pt presents today complaining of being extremely tired. Along with muscles spasms, lower extremities & lower back pain.  ?She also reports, after waking up from sleeping she still seems to be tired. She states her sugars have been doing pretty good and she does not think this is the issue. She denies appetite and sleep changes.  ?  ? ?Past Medical History:  ?Diagnosis Date  ? DM (diabetes mellitus) ([place])   ? Fatty liver disease, nonalcoholic   ? Gastritis   ? Gastroparesis   ? GERD (gastroesophageal reflux disease)   ? Heartburn   ? Hiatal hernia   ? HLD (hyperlipidemia)   ? HTN (hypertension)   ? Joint pain   ? Renal calculus   ? Rheumatoid arthritis ([place])   ? Vitamin D deficiency   ?  ? ?Family History  ?Problem Relation Age of Onset  ? Diabetes Mother   ? Thyroid disease Mother   ? Kidney disease Mother   ? Diabetes Maternal Grandmother   ? Stomach cancer Maternal Aunt   ?     X 2 aunts  ? Hypertension Father   ? Hyperlipidemia Father   ? Sudden death Father   ? Stroke Father   ? Colon cancer Neg Hx   ? Esophageal cancer Neg Hx   ? Rectal cancer Neg Hx   ? ? ? ?Current Outpatient Medications:  ?  amLODipine (NORVASC) 10 MG tablet, TAKE ONE TABLET BY MOUTH ONCE DAILY, Disp: 90 tablet, Rfl: 3 ?  aspirin EC 81 MG tablet,  Take 1 tablet (81 mg total) by mouth 2 (two) times daily. To be taken after surgery, Disp: 84 tablet, Rfl: 0 ?  Azilsartan Medoxomil (EDARBI) 80 MG TABS, TAKE ONE TABLET BY MOUTH ONCE DAILY, Disp: 90 tablet, Rfl: 2 ?  Blood Glucose Monitoring Suppl (ONETOUCH VERIO) w/Device KIT, Use as directed to check blood sugars 2 times per day dx: e11.65, Disp: 1 kit, Rfl: 1 ?  cetirizine (ZYRTEC) 10 MG tablet, Take 1 tablet (10 mg total) by mouth daily., Disp: 90 tablet, Rfl: 2 ?  dapagliflozin propanediol (FARXIGA) 10 MG TABS tablet, Take 1 tablet (10 mg total) by mouth daily before breakfast., Disp: 90 tablet, Rfl: 1 ?  ferrous gluconate (FERGON) 324 MG tablet, TAKE 1 TABLET BY MOUTH TWICE DAILY WITH A MEAL. (Patient taking differently: Take 324 mg by mouth 2 (two) times daily with a meal.), Disp: 100 tablet, Rfl: 0 ?  glucose blood (ONETOUCH VERIO) test strip, Use as instructed to check blood sugars 2 times per day dx:e11.65, Disp: 150 each, Rfl: 3 ?  insulin lispro (HUMALOG KWIKPEN) 100 UNIT/ML KwikPen, Sliding scale (Patient not taking: Reported on 06/22/2021), Disp: 30 mL, Rfl: 2 ?  OneTouch Delica Lancets 42A MISC, Use as directed  to check blood sugars 2 times per day dx:e11.65, Disp: 150 each, Rfl: 3 ?  pantoprazole (PROTONIX) 40 MG tablet, Take one tablet by mouth 3 days a week, Disp: 90 tablet, Rfl: 2 ?  Semaglutide, 2 MG/DOSE, 8 MG/3ML SOPN, Inject 2 mg as directed once a week., Disp: 9 mL, Rfl: 0 ?  temazepam (RESTORIL) 30 MG capsule, TAKE ONE CAPSULE BY MOUTH EVERYDAY AT BEDTIME AS NEEDED, Disp: 60 capsule, Rfl: 2 ?  TRUEPLUS PEN NEEDLES 31G X 6 MM MISC, USE WITH pen TO INJECT insulin DAILY, Disp: 150 each, Rfl: 3 ?  acetaminophen-codeine (TYLENOL #3) 300-30 MG tablet, Take 1 tablet by mouth every 8 (eight) hours as needed for moderate pain., Disp: 30 tablet, Rfl: 0 ?  celecoxib (CELEBREX) 200 MG capsule, Take 1 capsule (200 mg total) by mouth 2 (two) times daily as needed., Disp: 60 capsule, Rfl: 2 ?   chlorthalidone (HYGROTON) 25 MG tablet, Take 12.5 mg by mouth daily., Disp: , Rfl:  ?  ibuprofen (ADVIL) 800 MG tablet, Take 1 tablet (800 mg total) by mouth every 8 (eight) hours as needed., Disp: 30 tablet, Rfl: 0 ?  linaclotide ([name]) 145 MCG CAPS capsule, Take 1 capsule (145 mcg total) by mouth daily., Disp: 30 capsule, Rfl: 0 ?  methocarbamol (ROBAXIN) 500 MG tablet, Take 1 tablet (500 mg total) by mouth 2 (two) times daily., Disp: 20 tablet, Rfl: 0 ?  rosuvastatin (CRESTOR) 20 MG tablet, TAKE ONE TABLET BY MOUTH ONCE DAILY, Disp: 90 tablet, Rfl: 2 ?  traMADol (ULTRAM) 50 MG tablet, Take 1 tablet (50 mg total) by mouth every 12 (twelve) hours as needed., Disp: 30 tablet, Rfl: 2  ? ?Allergies  ?Allergen Reactions  ? Fiasp [Insulin Aspart (W-Niacinamide)] Hives  ?  ? ?Review of Systems  ?Constitutional:  Positive for fatigue.  ?Respiratory: Negative.    ?Cardiovascular: Negative.   ?Endocrine:  ?     On further questioning, she states her sugars have been running in the 60s. She has been taking Toujeo and Ozempic.   ?Neurological: Negative.   ?Psychiatric/Behavioral: Negative.     ? ?Today's Vitals  ? 06/17/21 1526 06/17/21 1527 06/17/21 1528  ?BP: (!) 142/80 138/72 (!) 172/80  ?Pulse: 62 80 (!) 58  ?Temp: 97.6 ?F (36.4 ?C)    ?SpO2: 98% 98% 97%  ?Weight: 167 lb 12.8 oz (76.1 kg)    ?Height: _0  (1.626 m)    ? ?Body mass index is 28.8 kg/m?.  ?Wt Readings from Last 3 Encounters:  ?06/25/21 163 lb (73.9 kg)  ?06/23/21 163 lb (73.9 kg)  ?06/22/21 163 lb (73.9 kg)  ?  ?Objective:  ?Physical Exam ?Vitals and nursing note reviewed.  ?Constitutional:   ?   Appearance: Normal appearance.  ?HENT:  ?   Head: Normocephalic and atraumatic.  ?Eyes:  ?   Extraocular Movements: Extraocular movements intact.  ?Cardiovascular:  ?   Rate and Rhythm: Normal rate and regular rhythm.  ?   Heart sounds: Normal heart sounds.  ?Pulmonary:  ?   Effort: Pulmonary effort is normal.  ?   Breath sounds: Normal breath sounds.   ?Musculoskeletal:  ?   Cervical back: Normal range of motion.  ?Skin: ?   General: Skin is warm.  ?Neurological:  ?   General: No focal deficit present.  ?   Mental Status: She is alert.  ?Psychiatric:     ?   Mood and Affect: Mood normal.     ?  Behavior: Behavior normal.  ?   ?Assessment And Plan:  ?   ?1. Other fatigue ?Comments: Possibly due to hypoglycemia. She is currently taking Farxiga 23m, 3 units Toujeo nightly and Ozempic weekly for her Dm. I will stop Toujeo. She was given vit b12 injection to see if her sx improve.  ?- TSH ?- cyanocobalamin ((VITAMIN B-12)) injection 1,000 mcg ? ?2. Muscle spasms of both lower extremities ?Comments: She is encouraged to stay well hydrated. She may benefit from Mg supplementation.  ?- CK, total ?- Magnesium ? ?3. Daytime somnolence ?Comments: I will refer her to Neuro for sleep study evaluation if her sx persist once meds have been titrated.  ? ?4. Type 2 diabetes mellitus with stage 3a chronic kidney disease, with long-term current use of insulin ([place] ?Comments: Again, I will stop Toujeo and continue to follow her blood sugars. She will rto in 2 months for DM check.  ? ?5. Hypertensive nephropathy ?Comments: Unconrtrolled BP. This is likely contributing to her fatigue.  She has yet to take meds today. Importance of med/dietary compliance was stressed to patient.  ? ?6. BMI 29.0-29.9,adult ?Comments: Her BMI is acceptable for her demographic. She is encouraged to gradually work up to 150 minutes of exercise per week.  ? ?7. Drug therapy ?Comments: She is on chronic PPI therapy, I will check vitamin B12 level.  ?- Vitamin B12 ?  ?Patient was given opportunity to ask questions. Patient verbalized understanding of the plan and was able to repeat key elements of the plan. All questions were answered to their satisfaction.  ? ?I, RMaximino Greenland MD, have reviewed all documentation for this visit. The documentation on 07/04/21 for the exam, diagnosis, procedures, and  orders are all accurate and complete.  ? ?IF YOU HAVE BEEN REFERRED TO A SPECIALIST, IT MAY TAKE 1-2 WEEKS TO SCHEDULE/PROCESS THE REFERRAL. IF YOU HAVE NOT HEARD FROM US/SPECIALIST IN TWO WEEKS, PLEASE GIVE [facility] CALL AT [phone]

## 2021-06-17 NOTE — Patient Instructions (Addendum)
Please take meds as below: ? ?Edarbi '80mg'$  in AM ?Farxiga '10mg'$  in AM ?Chlorthalidone in AM ? ?Amlodipine in PM ?Rosuvastatin in PM ?Temazepam in PM ? ?Hypertension, Adult ?Hypertension is another name for high blood pressure. High blood pressure forces your heart to work harder to pump blood. This can cause problems over time. ?There are two numbers in a blood pressure reading. There is a top number (systolic) over a bottom number (diastolic). It is best to have a blood pressure that is below 120/80. Healthy choices can help lower your blood pressure, or you may need medicine to help lower it. ?What are the causes? ?The cause of this condition is not known. Some conditions may be related to high blood pressure. ?What increases the risk? ?Smoking. ?Having type 2 diabetes mellitus, high cholesterol, or both. ?Not getting enough exercise or physical activity. ?Being overweight. ?Having too much fat, sugar, calories, or salt (sodium) in your diet. ?Drinking too much alcohol. ?Having long-term (chronic) kidney disease. ?Having a family history of high blood pressure. ?Age. Risk increases with age. ?Race. You may be at higher risk if you are African American. ?Gender. Men are at higher risk than women before age 82. After age 40, women are at higher risk than men. ?Having obstructive sleep apnea. ?Stress. ?What are the signs or symptoms? ?High blood pressure may not cause symptoms. Very high blood pressure (hypertensive crisis) may cause: ?Headache. ?Feelings of worry or nervousness (anxiety). ?Shortness of breath. ?Nosebleed. ?A feeling of being sick to your stomach (nausea). ?Throwing up (vomiting). ?Changes in how you see. ?Very bad chest pain. ?Seizures. ?How is this treated? ?This condition is treated by making healthy lifestyle changes, such as: ?Eating healthy foods. ?Exercising more. ?Drinking less alcohol. ?Your health care provider may prescribe medicine if lifestyle changes are not enough to get your blood  pressure under control, and if: ?Your top number is above 130. ?Your bottom number is above 80. ?Your personal target blood pressure may vary. ?Follow these instructions at home: ?Eating and drinking ? ?If told, follow the DASH eating plan. To follow this plan: ?Fill one half of your plate at each meal with fruits and vegetables. ?Fill one fourth of your plate at each meal with whole grains. Whole grains include whole-wheat pasta, brown rice, and whole-grain bread. ?Eat or drink low-fat dairy products, such as skim milk or low-fat yogurt. ?Fill one fourth of your plate at each meal with low-fat (lean) proteins. Low-fat proteins include fish, chicken without skin, eggs, beans, and tofu. ?Avoid fatty meat, cured and processed meat, or chicken with skin. ?Avoid pre-made or processed food. ?Eat less than 1,500 mg of salt each day. ?Do not drink alcohol if: ?Your doctor tells you not to drink. ?You are pregnant, may be pregnant, or are planning to become pregnant. ?If you drink alcohol: ?Limit how much you use to: ?0-1 drink a day for women. ?0-2 drinks a day for men. ?Be aware of how much alcohol is in your drink. In the [place] one drink equals one 12 oz bottle of beer (355 mL), one 5 oz glass of wine (148 mL), or one 1? oz glass of hard liquor (44 mL). ?Lifestyle ? ?Work with your doctor to stay at a healthy weight or to lose weight. Ask your doctor what the best weight is for you. ?Get at least 30 minutes of exercise most days of the week. This may include walking, swimming, or biking. ?Get at least 30 minutes of exercise that strengthens your  muscles (resistance exercise) at least 3 days a week. This may include lifting weights or doing Pilates. ?Do not use any products that contain nicotine or tobacco, such as cigarettes, e-cigarettes, and chewing tobacco. If you need help quitting, ask your doctor. ?Check your blood pressure at home as told by your doctor. ?Keep all follow-up visits as told by your doctor. This is  important. ?Medicines ?Take over-the-counter and prescription medicines only as told by your doctor. Follow directions carefully. ?Do not skip doses of blood pressure medicine. The medicine does not work as well if you skip doses. Skipping doses also puts you at risk for problems. ?Ask your doctor about side effects or reactions to medicines that you should watch for. ?Contact a doctor if you: ?Think you are having a reaction to the medicine you are taking. ?Have headaches that keep coming back (recurring). ?Feel dizzy. ?Have swelling in your ankles. ?Have trouble with your vision. ?Get help right away if you: ?Get a very bad headache. ?Start to feel mixed up (confused). ?Feel weak or numb. ?Feel faint. ?Have very bad pain in your: ?Chest. ?Belly (abdomen). ?Throw up more than once. ?Have trouble breathing. ?Summary ?Hypertension is another name for high blood pressure. ?High blood pressure forces your heart to work harder to pump blood. ?For most people, a normal blood pressure is less than 120/80. ?Making healthy choices can help lower blood pressure. If your blood pressure does not get lower with healthy choices, you may need to take medicine. ?This information is not intended to replace advice given to you by your health care provider. Make sure you discuss any questions you have with your health care provider. ?Document Revised: 11/16/2017 Document Reviewed: 11/16/2017 ?Elsevier Patient Education ? [place]. ? ?

## 2021-06-18 LAB — VITAMIN B12: Vitamin B-12: 549 pg/mL (ref 232–1245)

## 2021-06-18 LAB — MAGNESIUM: Magnesium: 2.3 mg/dL (ref 1.6–2.3)

## 2021-06-18 LAB — CK: Total CK: 136 U/L (ref 32–182)

## 2021-06-18 LAB — TSH: TSH: 1.64 u[IU]/mL (ref 0.450–4.500)

## 2021-06-22 ENCOUNTER — Ambulatory Visit (INDEPENDENT_AMBULATORY_CARE_PROVIDER_SITE_OTHER): Payer: Medicare Other | Admitting: Family Medicine

## 2021-06-22 ENCOUNTER — Encounter (INDEPENDENT_AMBULATORY_CARE_PROVIDER_SITE_OTHER): Payer: Self-pay | Admitting: Family Medicine

## 2021-06-22 ENCOUNTER — Telehealth: Payer: Self-pay

## 2021-06-22 VITALS — BP 155/69 | HR 56 | Temp 97.8°F | Ht 64.0 in | Wt 163.0 lb

## 2021-06-22 DIAGNOSIS — E1159 Type 2 diabetes mellitus with other circulatory complications: Secondary | ICD-10-CM

## 2021-06-22 DIAGNOSIS — E669 Obesity, unspecified: Secondary | ICD-10-CM | POA: Diagnosis not present

## 2021-06-22 DIAGNOSIS — K5904 Chronic idiopathic constipation: Secondary | ICD-10-CM

## 2021-06-22 DIAGNOSIS — E559 Vitamin D deficiency, unspecified: Secondary | ICD-10-CM | POA: Diagnosis not present

## 2021-06-22 DIAGNOSIS — Z6828 Body mass index (BMI) 28.0-28.9, adult: Secondary | ICD-10-CM | POA: Diagnosis not present

## 2021-06-22 DIAGNOSIS — E1169 Type 2 diabetes mellitus with other specified complication: Secondary | ICD-10-CM | POA: Diagnosis not present

## 2021-06-22 DIAGNOSIS — Z794 Long term (current) use of insulin: Secondary | ICD-10-CM | POA: Diagnosis not present

## 2021-06-22 DIAGNOSIS — I152 Hypertension secondary to endocrine disorders: Secondary | ICD-10-CM

## 2021-06-22 MED ORDER — LINACLOTIDE 145 MCG PO CAPS: 145.0000 ug | ORAL_CAPSULE | Freq: Every day | ORAL | 0 refills | Status: DC

## 2021-06-22 NOTE — Telephone Encounter (Signed)
Hey this pt returned your call. YL,RMA ?
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
## 2021-06-23 ENCOUNTER — Ambulatory Visit: Payer: Medicare Other

## 2021-06-23 VITALS — BP 120/82 | HR 62 | Ht 64.0 in | Wt 163.0 lb

## 2021-06-23 DIAGNOSIS — I129 Hypertensive chronic kidney disease with stage 1 through stage 4 chronic kidney disease, or unspecified chronic kidney disease: Secondary | ICD-10-CM

## 2021-06-23 NOTE — Progress Notes (Signed)
Pt presents today to check orthostatics. She reports taking bp medication this morning, edarbi '80mg'$  & chlorthalidone '25mg'$  she takes half tab in the morning. & amlodipine '10mg'$  at night. She takes medications as directed.  ? ?Sitting: 110/70 ?Pulse: 68 ?02:98 ? ?Standing: 108/78 ?Pulse: 80 ?02:98 ? ?Lying: 120/82 ?Pulse:62 ?02:98 ?Provider wants patient to continue with regimen as reported. & if she has any questions or concerns to give the office a call.  ?

## 2021-06-25 ENCOUNTER — Emergency Department (HOSPITAL_COMMUNITY): Payer: Medicare Other

## 2021-06-25 ENCOUNTER — Emergency Department (HOSPITAL_COMMUNITY)
Admission: EM | Admit: 2021-06-25 | Discharge: 2021-06-25 | Disposition: A | Discharge status: Home / Self Care | Payer: Medicare Other | Attending: Emergency Medicine | Admitting: Emergency Medicine

## 2021-06-25 ENCOUNTER — Other Ambulatory Visit: Payer: Self-pay

## 2021-06-25 ENCOUNTER — Ambulatory Visit: Payer: Self-pay

## 2021-06-25 ENCOUNTER — Telehealth: Payer: Medicare Other

## 2021-06-25 ENCOUNTER — Encounter (HOSPITAL_COMMUNITY): Payer: Self-pay

## 2021-06-25 DIAGNOSIS — I2510 Atherosclerotic heart disease of native coronary artery without angina pectoris: Secondary | ICD-10-CM | POA: Diagnosis not present

## 2021-06-25 DIAGNOSIS — I129 Hypertensive chronic kidney disease with stage 1 through stage 4 chronic kidney disease, or unspecified chronic kidney disease: Secondary | ICD-10-CM

## 2021-06-25 DIAGNOSIS — I700 Atherosclerosis of aorta: Secondary | ICD-10-CM | POA: Diagnosis not present

## 2021-06-25 DIAGNOSIS — Z20822 Contact with and (suspected) exposure to covid-19: Secondary | ICD-10-CM | POA: Diagnosis not present

## 2021-06-25 DIAGNOSIS — R531 Weakness: Secondary | ICD-10-CM | POA: Insufficient documentation

## 2021-06-25 DIAGNOSIS — Z79899 Other long term (current) drug therapy: Secondary | ICD-10-CM | POA: Diagnosis not present

## 2021-06-25 DIAGNOSIS — Z7982 Long term (current) use of aspirin: Secondary | ICD-10-CM | POA: Diagnosis not present

## 2021-06-25 DIAGNOSIS — I10 Essential (primary) hypertension: Secondary | ICD-10-CM | POA: Diagnosis not present

## 2021-06-25 DIAGNOSIS — R5383 Other fatigue: Secondary | ICD-10-CM | POA: Diagnosis not present

## 2021-06-25 DIAGNOSIS — N182 Chronic kidney disease, stage 2 (mild): Secondary | ICD-10-CM

## 2021-06-25 DIAGNOSIS — Z794 Long term (current) use of insulin: Secondary | ICD-10-CM | POA: Diagnosis not present

## 2021-06-25 DIAGNOSIS — M1712 Unilateral primary osteoarthritis, left knee: Secondary | ICD-10-CM

## 2021-06-25 DIAGNOSIS — R0602 Shortness of breath: Secondary | ICD-10-CM | POA: Diagnosis not present

## 2021-06-25 DIAGNOSIS — E1122 Type 2 diabetes mellitus with diabetic chronic kidney disease: Secondary | ICD-10-CM

## 2021-06-25 DIAGNOSIS — E119 Type 2 diabetes mellitus without complications: Secondary | ICD-10-CM | POA: Diagnosis not present

## 2021-06-25 DIAGNOSIS — R918 Other nonspecific abnormal finding of lung field: Secondary | ICD-10-CM | POA: Diagnosis not present

## 2021-06-25 LAB — COMPREHENSIVE METABOLIC PANEL
ALT: 37 U/L (ref 0–44)
AST: 47 U/L — ABNORMAL HIGH (ref 15–41)
Albumin: 4.2 g/dL (ref 3.5–5.0)
Alkaline Phosphatase: 94 U/L (ref 38–126)
Anion gap: 6 (ref 5–15)
BUN: 34 mg/dL — ABNORMAL HIGH (ref 8–23)
CO2: 25 mmol/L (ref 22–32)
Calcium: 9.5 mg/dL (ref 8.9–10.3)
Chloride: 107 mmol/L (ref 98–111)
Creatinine, Ser: 1.16 mg/dL — ABNORMAL HIGH (ref 0.44–1.00)
GFR, Estimated: 52 mL/min — ABNORMAL LOW (ref >60)
Glucose, Bld: 93 mg/dL (ref 70–99)
Potassium: 4.3 mmol/L (ref 3.5–5.1)
Sodium: 138 mmol/L (ref 135–145)
Total Bilirubin: 0.3 mg/dL (ref 0.3–1.2)
Total Protein: 7.9 g/dL (ref 6.5–8.1)

## 2021-06-25 LAB — RESP PANEL BY RT-PCR (FLU A&B, COVID) ARPGX2
Influenza A by PCR: NEGATIVE
Influenza B by PCR: NEGATIVE
SARS Coronavirus 2 by RT PCR: NEGATIVE

## 2021-06-25 LAB — CBC WITH DIFFERENTIAL/PLATELET
Abs Immature Granulocytes: 0.01 10*3/uL (ref 0.00–0.07)
Basophils Absolute: 0 10*3/uL (ref 0.0–0.1)
Basophils Relative: 1 %
Eosinophils Absolute: 0.1 10*3/uL (ref 0.0–0.5)
Eosinophils Relative: 2 %
HCT: 40.9 % (ref 36.0–46.0)
Hemoglobin: 13.6 g/dL (ref 12.0–15.0)
Immature Granulocytes: 0 %
Lymphocytes Relative: 39 %
Lymphs Abs: 1.8 10*3/uL (ref 0.7–4.0)
MCH: 29 pg (ref 26.0–34.0)
MCHC: 33.3 g/dL (ref 30.0–36.0)
MCV: 87.2 fL (ref 80.0–100.0)
Monocytes Absolute: 0.6 10*3/uL (ref 0.1–1.0)
Monocytes Relative: 14 %
Neutro Abs: 2 10*3/uL (ref 1.7–7.7)
Neutrophils Relative %: 44 %
Platelets: 245 10*3/uL (ref 150–400)
RBC: 4.69 MIL/uL (ref 3.87–5.11)
RDW: 15.2 % (ref 11.5–15.5)
WBC: 4.5 10*3/uL (ref 4.0–10.5)
nRBC: 0 % (ref 0.0–0.2)

## 2021-06-25 LAB — TROPONIN I (HIGH SENSITIVITY)
Troponin I (High Sensitivity): 6 ng/L (ref <18)
Troponin I (High Sensitivity): 6 ng/L (ref <18)

## 2021-06-25 LAB — D-DIMER, QUANTITATIVE: D-Dimer, Quant: 0.75 ug/mL-FEU — ABNORMAL HIGH (ref 0.00–0.50)

## 2021-06-25 LAB — LIPASE, BLOOD: Lipase: 53 U/L — ABNORMAL HIGH (ref 11–51)

## 2021-06-25 MED ORDER — SODIUM CHLORIDE (PF) 0.9 % IJ SOLN
INTRAMUSCULAR | Status: AC
  Filled 2021-06-25: qty 50

## 2021-06-25 MED ORDER — ALBUTEROL SULFATE HFA 108 (90 BASE) MCG/ACT IN AERS: 2.0000 | INHALATION_SPRAY | RESPIRATORY_TRACT | Status: DC | PRN

## 2021-06-25 MED ORDER — IOHEXOL 350 MG/ML SOLN
75.0000 mL | Freq: Once | INTRAVENOUS | Status: AC | PRN
  Administered 2021-06-25: 75 mL via INTRAVENOUS

## 2021-06-25 NOTE — ED Triage Notes (Signed)
Patient c/o SOB, fatigue and dizziness since yesterday. ?

## 2021-06-25 NOTE — ED Provider Notes (Signed)
?[facility] DEPT ?Provider Note ? ? ?CSN: [id number] ?Arrival date & time: 06/25/21  1318 ? ?  ? ?History ? ?Chief Complaint  ?Patient presents with  ? Shortness of Breath  ? Fatigue  ? ? ?Leslie Davis is a 66 y.o. female. ? ?66 yo F with a chief complaints of shortness of breath and fatigue.  Is been going on for a couple days now.  She feels like it is worse when she gets up and tries to move around.  Felt very fatigued.  Denies any cough congestion or fever.  Has been eating and drinking a bit less and she is not sure why.  She feels like she does not have an appetite.  No known sick contacts. ? ? ?Shortness of Breath ? ?  ? ?Home Medications ?Prior to Admission medications   ?Medication Sig Start Date End Date Taking? Authorizing Provider  ?amLODipine (NORVASC) 10 MG tablet TAKE ONE TABLET BY MOUTH ONCE DAILY 12/01/20   [name], FNP  ?aspirin EC 81 MG tablet Take 1 tablet (81 mg total) by mouth 2 (two) times daily. To be taken after surgery 10/21/20   Aundra Dubin, PA-C  ?Azilsartan Medoxomil (EDARBI) 80 MG TABS TAKE ONE TABLET BY MOUTH ONCE DAILY 03/30/21   [name], MD  ?Blood Glucose Monitoring Suppl [facility] VERIO) w/Device KIT Use as directed to check blood sugars 2 times per day dx: e11.65 02/13/19   [name], MD  ?cetirizine (ZYRTEC) 10 MG tablet Take 1 tablet (10 mg total) by mouth daily. 12/12/19   [name], MD  ?chlorthalidone (HYGROTON) 25 MG tablet Take 12.5 mg by mouth daily. 06/04/21   [provider]  ?dapagliflozin propanediol (FARXIGA) 10 MG TABS tablet Take 1 tablet (10 mg total) by mouth daily before breakfast. 05/26/21   [name], MD  ?ferrous gluconate (FERGON) 324 MG tablet TAKE 1 TABLET BY MOUTH TWICE DAILY WITH A MEAL. ?Patient taking differently: Take 324 mg by mouth 2 (two) times daily with a meal. 05/28/19   Mansouraty, Telford Nab., MD  ?glucose blood (ONETOUCH VERIO) test strip Use as instructed to check blood sugars 2  times per day dx:e11.65 02/18/20   [name], MD  ?ibuprofen (ADVIL) 800 MG tablet Take 1 tablet (800 mg total) by mouth every 8 (eight) hours as needed. 05/05/21   Aundra Dubin, PA-C  ?insulin glargine, 1 Unit Dial, (TOUJEO SOLOSTAR) 300 UNIT/ML Solostar Pen 18 units q hs ?Patient not taking: Reported on 06/22/2021 04/27/21   [name], DO  ?insulin lispro (HUMALOG KWIKPEN) 100 UNIT/ML KwikPen Sliding scale ?Patient not taking: Reported on 06/22/2021 12/13/19   [name], MD  ?linaclotide [facility]) 145 MCG CAPS capsule Take 1 capsule (145 mcg total) by mouth daily. 06/22/21   [name], DO  ?methocarbamol (ROBAXIN) 500 MG tablet Take 1 tablet (500 mg total) by mouth 2 (two) times daily. 05/11/21   Prosperi, Darrick Meigs H, PA-C  ?OneTouch Delica Lancets 29F MISC Use as directed to check blood sugars 2 times per day dx:e11.65 02/13/19   [name], MD  ?pantoprazole (PROTONIX) 40 MG tablet Take one tablet by mouth 3 days a week 08/28/20   [name], MD  ?rosuvastatin (CRESTOR) 20 MG tablet TAKE ONE TABLET BY MOUTH ONCE DAILY 02/19/21   [name], MD  ?Semaglutide, 2 MG/DOSE, 8 MG/3ML SOPN Inject 2 mg as directed once a week. 04/27/21   Opalski, Deborah, DO  ?temazepam (RESTORIL) 30 MG capsule TAKE ONE CAPSULE  BY MOUTH EVERYDAY AT BEDTIME AS NEEDED 01/21/21   [name], MD  ?traMADol (ULTRAM) 50 MG tablet Take 1 tablet (50 mg total) by mouth every 12 (twelve) hours as needed. 05/05/21   Aundra Dubin, PA-C  ?TRUEPLUS PEN NEEDLES 31G X 6 MM MISC USE WITH pen TO INJECT insulin DAILY 12/01/20   [name], FNP  ?   ? ?Allergies    ?Fiasp [insulin aspart (w-niacinamide)]   ? ?Review of Systems   ?Review of Systems  ?Respiratory:  Positive for shortness of breath.   ? ?Physical Exam ?Updated Vital Signs ?BP 104/67   Pulse (!) 40   Temp 98.2 ?F (36.8 ?C)   Resp 11   Ht $R'5\' 4"'At$  (1.626 m)   Wt 73.9 kg   SpO2 100%   BMI 27.98 kg/m?  ?Physical Exam ?Vitals and nursing note reviewed.   ?Constitutional:   ?   General: She is not in acute distress. ?   Appearance: She is well-developed. She is not diaphoretic.  ?HENT:  ?   Head: Normocephalic and atraumatic.  ?Eyes:  ?   Pupils: Pupils are equal, round, and reactive to light.  ?Cardiovascular:  ?   Rate and Rhythm: Normal rate and regular rhythm.  ?   Heart sounds: No murmur heard. ?  No friction rub. No gallop.  ?Pulmonary:  ?   Effort: Pulmonary effort is normal.  ?   Breath sounds: No wheezing or rales.  ?Abdominal:  ?   General: There is no distension.  ?   Palpations: Abdomen is soft.  ?   Tenderness: There is no abdominal tenderness.  ?Musculoskeletal:     ?   General: No tenderness.  ?   Cervical back: Normal range of motion and neck supple.  ?Skin: ?   General: Skin is warm and dry.  ?Neurological:  ?   Mental Status: She is alert and oriented to person, place, and time.  ?Psychiatric:     ?   Behavior: Behavior normal.  ? ? ?ED Results / Procedures / Treatments   ?Labs ?(all labs ordered are listed, but only abnormal results are displayed) ?Labs Reviewed  ?COMPREHENSIVE METABOLIC PANEL - Abnormal; Notable for the following components:  ?    Result Value  ? BUN 34 (*)   ? Creatinine, Ser 1.16 (*)   ? AST 47 (*)   ? GFR, Estimated 52 (*)   ? All other components within normal limits  ?LIPASE, BLOOD - Abnormal; Notable for the following components:  ? Lipase 53 (*)   ? All other components within normal limits  ?D-DIMER, QUANTITATIVE - Abnormal; Notable for the following components:  ? D-Dimer, Quant 0.75 (*)   ? All other components within normal limits  ?RESP PANEL BY RT-PCR (FLU A&B, COVID) [id number]  ?CBC WITH DIFFERENTIAL/PLATELET  ?TROPONIN I (HIGH SENSITIVITY)  ?TROPONIN I (HIGH SENSITIVITY)  ? ? ?EKG ?EKG Interpretation ? ?Date/Time:  Thursday June 25 2021 13:25:22 EDT ?Ventricular Rate:  54 ?PR Interval:  132 ?QRS Duration: 92 ?QT Interval:  377 ?QTC Calculation: 358 ?R Axis:   55 ?Text Interpretation: Sinus rhythm Baseline wander in  lead(s) V1 flipped t wave in aVL seen on prior No significant change since last tracing Confirmed by Deno Etienne [id number]) on 06/25/2021 1:27:47 PM ? ?Radiology ?DG Chest 2 View ? ?Result Date: 06/25/2021 ?CLINICAL DATA:  Shortness of breath.  Fatigue. EXAM: CHEST - 2 VIEW COMPARISON:  Chest radiograph 05/11/2021 FINDINGS: Both lungs are clear. Heart  and mediastinum are within normal limits. Atherosclerotic calcifications at the aortic arch. Trachea is midline. No acute bone abnormality. No pleural effusions. IMPRESSION: No active cardiopulmonary disease. Electronically Signed   By: Markus Daft M.D.   On: 06/25/2021 13:59   ? ?Procedures ?Procedures  ? ? ?Medications Ordered in ED ?Medications  ?albuterol (VENTOLIN HFA) 108 (90 Base) MCG/ACT inhaler 2 puff (has no administration in time range)  ? ? ?ED Course/ Medical Decision Making/ A&P ?  ?                        ?Medical Decision Making ?Amount and/or Complexity of Data Reviewed ?Labs: ordered. ?Radiology: ordered. ?ECG/medicine tests: ordered. ? ?Risk ?Prescription drug management. ? ? ?66 yo F with a chief complaints of fatigue.  Going on for a couple days now.  Could be viral in etiology.  She is not endorsing any other significant viral symptoms.  We will obtain a laboratory evaluation serial troponins chest x-ray reassess. ? ?Initial troponin is negative.  No significant anemia chest x-ray independently interpreted by me without focal obstructive pneumothorax.  Patient reassessed and continues to feel well.  D-dimer just resulted and is positive.  We will order a CT angiogram of the chest.  Awaiting second troponin.  If these are unremarkable then initial plan would be for possible outpatient follow-up with her family doctor. ? ?Signed out to Dr. Jeanell Sparrow, please see her note for further details of care in the ED.  ? ?The patients results and plan were reviewed and discussed.   ?Any x-rays performed were independently reviewed by myself.  ? ?Differential diagnosis were  considered with the presenting HPI. ? ?Medications  ?albuterol (VENTOLIN HFA) 108 (90 Base) MCG/ACT inhaler 2 puff (has no administration in time range)  ? ? ?Vitals:  ? 06/25/21 1324 06/25/21 1445  ?BP: (!) 149/63

## 2021-06-25 NOTE — Progress Notes (Signed)
? ? ? ?Chief Complaint:  ? ?OBESITY ?Leslie Davis is here to discuss her progress with her obesity treatment plan along with follow-up of her obesity related diagnoses. Leslie Davis is on the Category 1 Plan and states she is following her eating plan approximately 90-95% of the time. Leslie Davis states she is not currently exercising. ? ?Today's visit was #: 16 ?Starting weight: 208 lbs ?Starting date: 07/23/2020 ?Today's weight: 163 lbs ?Today's date: 06/22/2021 ?Total lbs lost to date: 35 ?Total lbs lost since last in-office visit: 4 ? ?Interim History: Leslie Davis did exceptionally well. She feels great!. Her PCP took her off insulin recently, and she notes more energy. PCP drew labs 05/28/2021, which I reviewed with the patient.  ? ?Subjective:  ? ?1. Type 2 diabetes mellitus with other specified complication, with long-term current use of insulin ([place]) ?Leslie Davis's PCP, Dr. Baird Cancer took her off insulin Toujeo. Last A1c was 6.2 on 05/28/2021 at her PCP's office. Her fasting blood sugars range at 107 this morning. No lows or highs. She feels energy is better since off insulin. I discussed labs with the patient today.  ? ?2. Hypertension associated with type 2 diabetes mellitus ([place]) ?Leslie Davis's blood pressures at home ranges 130's/60's the other days. She checks her blood pressure 1-2 times daily. Highest 142/65 that she has seen lately. I discussed CMP with the patient today.  ? ?3. Chronic idiopathic constipation ?Leslie Davis is to take her medications as needed now. Her symptoms are well controlled. She is drinking 5-6 bottles of water per day. I discussed CMP done on 05/28/2021 with the patient today.   ? ?4. Vitamin D deficiency ?Leslie Davis was told to stop all Vit D in mid January. Her starting Vitamin D level was 24.3. Last Vitamin D level was 82.1 on 03/31/2021. ? ?Assessment/Plan:  ? ?Orders Placed This Encounter  ?Procedures  ? VITAMIN D 25 Hydroxy (Vit-D Deficiency, Fractures)  ? ? ?Medications Discontinued During This Encounter   ?Medication Reason  ? linaclotide ([name]) 145 MCG CAPS capsule Reorder  ?  ? ?Meds ordered this encounter  ?Medications  ? linaclotide ([name]) 145 MCG CAPS capsule  ?  Sig: Take 1 capsule (145 mcg total) by mouth daily.  ?  Dispense:  30 capsule  ?  Refill:  0  ?  ? ?1. Type 2 diabetes mellitus with other specified complication, with long-term current use of insulin (Leslie Davis) ?Leslie Davis will continue to hold insulin. She will continue Ozempic and continue [place]. Continue close monitoring of blood sugars.  ? ?2. Hypertension associated with type 2 diabetes mellitus (Leslie Davis) ?Leslie Davis's blood pressure is stable and at goal at home. Increase water intake. Serum creatinine improved from prior and is stable.  ? ?3. Chronic idiopathic constipation ?We will refill [name] for 1 month. Leslie Davis will continue to increase her water intake and try to drink a little more as tolerated.  ? ?- linaclotide ([name]) 145 MCG CAPS capsule; Take 1 capsule (145 mcg total) by mouth daily.  Dispense: 30 capsule; Refill: 0 ? ?4. Vitamin D deficiency ?We will recheck Vit D level at her next office visit. Leslie Davis will follow-up for routine testing of Vitamin D, at least 2-3 times per year to avoid over-replacement. ? ?- VITAMIN D 25 Hydroxy (Vit-D Deficiency, Fractures) ? ?5. Obesity with current BMI of 28.1 ?Leslie Davis is currently in the action stage of change. As such, her goal is to continue with weight loss efforts. She has agreed to the Category 1 Plan with breakfast options.  ? ?Handout on protein  content of food were given to the patient, and increase water intake. We will recheck labs at her next office visit. ? ?Exercise goals: All adults should avoid inactivity. Some physical activity is better than none, and adults who participate in any amount of physical activity gain some health benefits. Start 5 minutes of walking daily. ? ?Behavioral modification strategies: increasing lean protein intake and decreasing simple  carbohydrates. ? ?Leslie Davis has agreed to follow-up with our clinic in 3 weeks. She was informed of the importance of frequent follow-up visits to maximize her success with intensive lifestyle modifications for her multiple health conditions.  ? ?Objective:  ? ?Blood pressure (!) 155/69, pulse (!) 56, temperature 97.8 ?F (36.6 ?C), height '5\' 4"'$  (1.626 m), weight 163 lb (73.9 kg), SpO2 100 %. ?Body mass index is 27.98 kg/m?. ? ?General: Cooperative, alert, well developed, in no acute distress. ?HEENT: Conjunctivae and lids unremarkable. ?Cardiovascular: Regular rhythm.  ?Lungs: Normal work of breathing. ?Neurologic: No focal deficits.  ? ?Lab Results  ?Component Value Date  ? CREATININE 1.08 (H) 05/28/2021  ? BUN 20 05/28/2021  ? NA 141 05/28/2021  ? K 4.9 05/28/2021  ? CL 104 05/28/2021  ? CO2 20 05/28/2021  ? ?Lab Results  ?Component Value Date  ? ALT 30 05/28/2021  ? AST 24 05/28/2021  ? ALKPHOS 128 (H) 05/28/2021  ? BILITOT 0.2 05/28/2021  ? ?Lab Results  ?Component Value Date  ? HGBA1C 6.2 (H) 05/28/2021  ? HGBA1C 6.1 (H) 02/26/2021  ? HGBA1C 6.3 (H) 10/22/2020  ? HGBA1C 6.8 (H) 08/25/2020  ? HGBA1C 8.7 (H) 05/28/2020  ? ?No results found for: INSULIN ?Lab Results  ?Component Value Date  ? TSH 1.640 06/17/2021  ? ?Lab Results  ?Component Value Date  ? CHOL 156 11/20/2020  ? HDL 61 11/20/2020  ? [place] 78 11/20/2020  ? TRIG 89 11/20/2020  ? CHOLHDL 2.6 11/20/2020  ? ?Lab Results  ?Component Value Date  ? VD25OH 82.1 03/31/2021  ? VD25OH 102.0 (H) 11/20/2020  ? VD25OH 95.2 10/22/2020  ? ?Lab Results  ?Component Value Date  ? WBC 5.1 12/23/2020  ? HGB 11.2 (L) 12/23/2020  ? HCT 34.9 (L) 12/23/2020  ? MCV 85.1 12/23/2020  ? PLT 256.0 12/23/2020  ? ?Lab Results  ?Component Value Date  ? IRON 68 11/16/2017  ? TIBC 355 11/16/2017  ? FERRITIN 45.2 11/16/2017  ? ? ?Obesity Behavioral Intervention:  ? ?Approximately 15 minutes were spent on the discussion below. ? ?ASK: ?We discussed the diagnosis of obesity with Leslie Davis  today and Leslie Davis agreed to give [place] permission to discuss obesity behavioral modification therapy today. ? ?ASSESS: ?Leslie Davis has the diagnosis of obesity and her BMI today is 28.1. Leslie Davis is in the action stage of change.  ? ?ADVISE: ?Leslie Davis was educated on the multiple health risks of obesity as well as the benefit of weight loss to improve her health. She was advised of the need for long term treatment and the importance of lifestyle modifications to improve her current health and to decrease her risk of future health problems. ? ?AGREE: ?Multiple dietary modification options and treatment options were discussed and Leslie Davis agreed to follow the recommendations documented in the above note. ? ?ARRANGE: ?Leslie Davis was educated on the importance of frequent visits to treat obesity as outlined per CMS and USPSTF guidelines and agreed to schedule her next follow up appointment today. ? ?Attestation Statements:  ? ?Reviewed by clinician on day of visit: allergies, medications, problem list, medical history, surgical  history, family history, social history, and previous encounter notes. ? ? ?I, Trixie Dredge, am acting as transcriptionist for [employer], DO. ? ?I have reviewed the above documentation for accuracy and completeness, and I agree with the above. Leslie Davis, D.O. ? ?The [place] was signed into law in 2016 which includes the topic of electronic health records.  This provides immediate access to information in MyChart.  This includes consultation notes, operative notes, office notes, lab results and pathology reports.  If you have any questions about what you read please let us know at your next visit so we can discuss your concerns and take corrective action if need be.  We are right here with you. ? ? ?

## 2021-06-25 NOTE — Discharge Instructions (Addendum)
No definite cause was found of your shortness of breath today. ?You had a CT of your lungs that did not show any evidence of blood clots or other reasons in your lungs to be short of breath ?However, you do have calcifications in your coronary arteries which may indicate heart disease. ?You had a heart enzymes drawn and EKG that did not show any evidence of heart attack.  You are being given a referral to a cardiologist.  Please call and make an appointment. ?Please call your primary care doctor tomorrow for recheck. ?Return to the emergency department if you are having any worsening symptoms such as worsening shortness of breath, chest pain, or other new symptoms ?

## 2021-06-25 NOTE — Chronic Care Management (AMB) (Signed)
?  Care Management  ? ?Follow Up Note ? ? ?06/25/2021 ?Name: MARTHE DANT MRN: [id number] DOB: 1955-09-20 ? ? ?Referred by: [name], MD ?Reason for referral : Chronic Care Management (RN CM Follow up call call #4) ? ? ?Third unsuccessful telephone outreach was attempted today. The patient was referred to the case management team for assistance with care management and care coordination. The patient's primary care provider has been notified of our unsuccessful attempts to make or maintain contact with the patient. The care management team is pleased to engage with this patient at any time in the future should he/she be interested in assistance from the care management team.  ? ?Follow Up Plan: We have been unable to make contact with the patient for follow up. The care management team is available to follow up with the patient after provider conversation with the patient regarding recommendation for care management engagement and subsequent re-referral to the care management team.  ? ?Barb Merino, RN, BSN, CCM ?Care Management Coordinator ?[facility]  ?Direct Phone: [phone] ? ? ?

## 2021-06-25 NOTE — ED Provider Notes (Signed)
66 yo female ho hypertension dm, no known cad with isolated dyspnea for 1 day, poor appetite.  CXR clear, ekg ok.  D-dimer slightly above age adjusted, cta being ordered. ? ?Physical Exam  ?BP 104/67   Pulse (!) 40   Temp 98.2 ?F (36.8 ?C)   Resp 11   Ht 1.626 m ('5\' 4"'$ )   Wt 73.9 kg   SpO2 100%   BMI 27.98 kg/m?  ? ?Physical Exam ? ?Procedures  ?Procedures ? ?ED Course / MDM  ? ?Clinical Course as of 06/25/21 1717  ?Thu Jun 25, 2021  ?1634 CT angio chest reviewed and no pulmonary embolism or other acute intrathoracic abnormalities noted there are coronary artery calcifications noted [DR]  ?1709 Discussed CT results with patient.  No obvious source of dyspnea but there are for coronary artery calcifications noted.  Awaiting repeat troponin.  If this is negative then patient will be discharged.  She is given referral to follow-up with cardiology. [DR]  ?1716 Repeat troponin reviewed and normal [DR]  ?  ?Clinical Course User Index ?[DR] Pattricia Boss, MD  ? ?Medical Decision Making ?Amount and/or Complexity of Data Reviewed ?Labs: ordered. ?Radiology: ordered. ?ECG/medicine tests: ordered. ? ?Risk ?Prescription drug management. ? ? ?Plan delta trop with first ok ?CTA ?D/C if above negative with f/u ? ? ?Reviewed results with patient ?Advise regarding need for follow-up return precautions and voices understanding.  She is currently asymptomatic and feels well enough to be discharged home. ? ?  ?Pattricia Boss, MD ?06/25/21 1717 ? ?

## 2021-06-30 ENCOUNTER — Ambulatory Visit (INDEPENDENT_AMBULATORY_CARE_PROVIDER_SITE_OTHER): Payer: Medicare Other

## 2021-06-30 DIAGNOSIS — I129 Hypertensive chronic kidney disease with stage 1 through stage 4 chronic kidney disease, or unspecified chronic kidney disease: Secondary | ICD-10-CM

## 2021-06-30 DIAGNOSIS — Z794 Long term (current) use of insulin: Secondary | ICD-10-CM

## 2021-06-30 DIAGNOSIS — E1169 Type 2 diabetes mellitus with other specified complication: Secondary | ICD-10-CM
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
## 2021-07-01 ENCOUNTER — Ambulatory Visit (INDEPENDENT_AMBULATORY_CARE_PROVIDER_SITE_OTHER): Payer: Medicare Other

## 2021-07-01 ENCOUNTER — Ambulatory Visit (INDEPENDENT_AMBULATORY_CARE_PROVIDER_SITE_OTHER): Payer: Medicare Other | Admitting: Physician Assistant

## 2021-07-01 DIAGNOSIS — Z96652 Presence of left artificial knee joint: Secondary | ICD-10-CM

## 2021-07-01 MED ORDER — CELECOXIB 200 MG PO CAPS: 200.0000 mg | ORAL_CAPSULE | Freq: Two times a day (BID) | ORAL | 2 refills | Status: DC | PRN

## 2021-07-01 MED ORDER — ACETAMINOPHEN-CODEINE #3 300-30 MG PO TABS: 1.0000 | ORAL_TABLET | Freq: Three times a day (TID) | ORAL | 0 refills | Status: DC | PRN

## 2021-07-01 NOTE — Progress Notes (Signed)
? ?Post-Op Visit Note ?  ?Patient: Leslie Davis           ?Date of Birth: 06/24/1955           ?MRN: [id number] ?Visit Date: 07/01/2021 ?PCP: [name], MD ? ? ?Assessment & Plan: ? ?Chief Complaint:  ?Chief Complaint  ?Patient presents with  ? Left Knee - Pain, Follow-up  ? ?Visit Diagnoses:  ?1. Status post total left knee replacement   ? ? ?Plan: Patient is a pleasant 66 year old female who comes in today approximately 8 months status post left total knee replacement 10/27/2020.  She is still complaining of pain to the entire knee rating down the anterior lower leg but notes that this has continued to improve.  Majority of her pain occurs with exercise as well as the days following.  Examination of the left knee reveals no effusion.  Range of motion 0 to 100 degrees.  Medial and lateral joint line tenderness.  She is stable to valgus varus stress.  4 out of 5 strength with resisted straight leg raise.  She is neurovascular intact distally.  At this point, I am not concerned for infection.  I do believe she would benefit from strength training which would hopefully help her pain as well.  I have sent in a referral for outpatient physical therapy.  I would also like her to ice as needed.  Celebrex and Tylenol 3 sent in.  Dental prophylaxis reinforced.  Follow-up with [place] in 4 months time for repeat evaluation and 2 view x-rays of the left knee.  Call with concerns or questions. ? ?Follow-Up Instructions: Return in about 4 months (around 10/31/2021).  ? ?Orders:  ?Orders Placed This Encounter  ?Procedures  ? XR Knee 1-2 Views Left  ? Ambulatory referral to Physical Therapy  ? ?Meds ordered this encounter  ?Medications  ? acetaminophen-codeine (TYLENOL #3) 300-30 MG tablet  ?  Sig: Take 1 tablet by mouth every 8 (eight) hours as needed for moderate pain.  ?  Dispense:  30 tablet  ?  Refill:  0  ? celecoxib (CELEBREX) 200 MG capsule  ?  Sig: Take 1 capsule (200 mg total) by mouth 2 (two) times daily as needed.  ?   Dispense:  60 capsule  ?  Refill:  2  ? ? ?Imaging: ?XR Knee 1-2 Views Left ? ?Result Date: 07/01/2021 ?Well-seated prosthesis without complication  ? ?PMFS History: ?Patient Active Problem List  ? Diagnosis Date Noted  ? Other constipation 05/25/2021  ? Class 2 severe obesity with serious comorbidity and body mass index (BMI) of 35.0 to 35.9 in adult [facility]) 12/01/2020  ? Status post total left knee replacement 10/27/2020  ? Primary osteoarthritis of left knee   ? NAFLD (nonalcoholic fatty liver disease) 08/06/2020  ? Other fatigue 07/23/2020  ? SOBOE (shortness of breath on exertion) 07/23/2020  ? Diabetes mellitus ([place]) 07/23/2020  ? Hypertension associated with type 2 diabetes mellitus ([place]) 07/23/2020  ? Hyperlipidemia associated with type 2 diabetes mellitus ([place]) 07/23/2020  ? Absolute anemia 07/23/2020  ? Vitamin D deficiency 07/23/2020  ? At risk for heart disease 07/23/2020  ? AKI (acute kidney injury) ([place]) 09/07/2019  ? Acute kidney injury ([place]) 09/06/2019  ? Bradycardia 09/06/2019  ? Hyperglycemia 09/06/2019  ? HLD (hyperlipidemia)   ? Hypertensive nephropathy 05/16/2019  ? Erosive gastritis 11/16/2017  ? Fatty liver 11/16/2017  ? Abdominal pain 12/11/2012  ? Early satiety 12/11/2012  ? Hiatal hernia   ? Hx of  cholecystectomy 02/05/2011  ? Flu-like symptoms 02/05/2011  ? CONSTIPATION 08/27/2008  ? FATTY LIVER DISEASE 05/07/2008  ? Nausea with vomiting 04/23/2008  ? EPIGASTRIC PAIN 04/23/2008  ? RENAL CALCULUS 04/22/2008  ? Type 2 diabetes mellitus with stage 2 chronic kidney disease, with long-term current use of insulin ([place]) 05/19/2006  ? Type 2 diabetes mellitus with hyperlipidemia ([place]) 05/19/2006  ? HYPERTENSION, BENIGN SYSTEMIC 05/19/2006  ? GASTROESOPHAGEAL REFLUX, NO ESOPHAGITIS 05/19/2006  ? AMENORRHEA 05/19/2006  ? ?Past Medical History:  ?Diagnosis Date  ? DM (diabetes mellitus) ([place])   ? Fatty liver disease, nonalcoholic   ? Gastritis   ? Gastroparesis   ? GERD (gastroesophageal reflux  disease)   ? Heartburn   ? Hiatal hernia   ? HLD (hyperlipidemia)   ? HTN (hypertension)   ? Joint pain   ? Renal calculus   ? Rheumatoid arthritis ([place])   ? Vitamin D deficiency   ?  ?Family History  ?Problem Relation Age of Onset  ? Diabetes Mother   ? Thyroid disease Mother   ? Kidney disease Mother   ? Diabetes Maternal Grandmother   ? Stomach cancer Maternal Aunt   ?     X 2 aunts  ? Hypertension Father   ? Hyperlipidemia Father   ? Sudden death Father   ? Stroke Father   ? Colon cancer Neg Hx   ? Esophageal cancer Neg Hx   ? Rectal cancer Neg Hx   ?  ?Past Surgical History:  ?Procedure Laterality Date  ? CHOLECYSTECTOMY    ? COLONOSCOPY    ? TOTAL KNEE ARTHROPLASTY Left 10/27/2020  ? Procedure: LEFT TOTAL KNEE ARTHROPLASTY;  Surgeon: [name], MD;  Location: [facility];  Service: Orthopedics;  Laterality: Left;  ? TUBAL LIGATION    ? UPPER GASTROINTESTINAL ENDOSCOPY    ? ?Social History  ? ?Occupational History  ? Occupation: Housekeeping  ?  Comment: [employer]  ?Tobacco Use  ? Smoking status: Former  ?  Packs/day: 0.25  ?  Years: 1.00  ?  Pack years: 0.25  ?  Types: Cigarettes  ?  Quit date: 09/10/1974  ?  Years since quitting: 46.8  ? Smokeless tobacco: Never  ? Tobacco comments:  ?  she no longer smokes.   ?Vaping Use  ? Vaping Use: Never used  ?Substance and Sexual Activity  ? Alcohol use: No  ? Drug use: No  ? Sexual activity: Not Currently  ? ? ? ?

## 2021-07-06 NOTE — Therapy (Signed)
?OUTPATIENT PHYSICAL THERAPY EVALUATION ? ? ?Patient Name: Leslie Davis ?MRN: [id number] ?DOB:[birth year], 66 y.o., female ?Today's Date: 07/07/2021 ? ? PT End of Session - 07/07/21 0834   ? ? Visit Number 1   ? Number of Visits 20   ? Date for PT Re-Evaluation 09/15/21   ? Authorization Type [employer] Medicare 20% Coinsurance   ? Progress Note Due on Visit 10   ? PT Start Time [phone]   ? PT Stop Time 2951   ? PT Time Calculation (min) 30 min   ? Activity Tolerance Patient tolerated treatment well   ? Behavior During Therapy [facility] for tasks assessed/performed   ? ?  ?  ? ?  ? ? ?Past Medical History:  ?Diagnosis Date  ? DM (diabetes mellitus) ([place])   ? Fatty liver disease, nonalcoholic   ? Gastritis   ? Gastroparesis   ? GERD (gastroesophageal reflux disease)   ? Heartburn   ? Hiatal hernia   ? HLD (hyperlipidemia)   ? HTN (hypertension)   ? Joint pain   ? Renal calculus   ? Rheumatoid arthritis ([place])   ? Vitamin D deficiency   ? ?Past Surgical History:  ?Procedure Laterality Date  ? CHOLECYSTECTOMY    ? COLONOSCOPY    ? TOTAL KNEE ARTHROPLASTY Left 10/27/2020  ? Procedure: LEFT TOTAL KNEE ARTHROPLASTY;  Surgeon: [name], MD;  Location: [facility];  Service: Orthopedics;  Laterality: Left;  ? TUBAL LIGATION    ? UPPER GASTROINTESTINAL ENDOSCOPY    ? ?Patient Active Problem List  ? Diagnosis Date Noted  ? Other constipation 05/25/2021  ? Class 2 severe obesity with serious comorbidity and body mass index (BMI) of 35.0 to 35.9 in adult [facility]) 12/01/2020  ? Status post total left knee replacement 10/27/2020  ? Primary osteoarthritis of left knee   ? NAFLD (nonalcoholic fatty liver disease) 08/06/2020  ? Other fatigue 07/23/2020  ? SOBOE (shortness of breath on exertion) 07/23/2020  ? Diabetes mellitus ([place]) 07/23/2020  ? Hypertension associated with type 2 diabetes mellitus ([place]) 07/23/2020  ? Hyperlipidemia associated with type 2 diabetes mellitus ([place]) 07/23/2020  ? Absolute anemia 07/23/2020  ? Vitamin D deficiency 07/23/2020   ? At risk for heart disease 07/23/2020  ? AKI (acute kidney injury) ([place]) 09/07/2019  ? Acute kidney injury ([place]) 09/06/2019  ? Bradycardia 09/06/2019  ? Hyperglycemia 09/06/2019  ? HLD (hyperlipidemia)   ? Hypertensive nephropathy 05/16/2019  ? Erosive gastritis 11/16/2017  ? Fatty liver 11/16/2017  ? Abdominal pain 12/11/2012  ? Early satiety 12/11/2012  ? Hiatal hernia   ? Hx of cholecystectomy 02/05/2011  ? Flu-like symptoms 02/05/2011  ? CONSTIPATION 08/27/2008  ? FATTY LIVER DISEASE 05/07/2008  ? Nausea with vomiting 04/23/2008  ? EPIGASTRIC PAIN 04/23/2008  ? RENAL CALCULUS 04/22/2008  ? Type 2 diabetes mellitus with stage 2 chronic kidney disease, with long-term current use of insulin ([place]) 05/19/2006  ? Type 2 diabetes mellitus with hyperlipidemia ([place]) 05/19/2006  ? HYPERTENSION, BENIGN SYSTEMIC 05/19/2006  ? GASTROESOPHAGEAL REFLUX, NO ESOPHAGITIS 05/19/2006  ? AMENORRHEA 05/19/2006  ? ? ?PCP: [name], MD ? ?REFERRING PROVIDER: Aundra Dubin, PA-C ? ?REFERRING DIAG: O84.166 (ICD-10-CM) - Status post total left knee replacement ? ?THERAPY DIAG:  ?Chronic pain of left knee ? ?Muscle weakness (generalized) ? ?Difficulty in walking, not elsewhere classified ? ?Localized edema ? ?ONSET DATE: 10/27/2020 - TKA ? ?SUBJECTIVE:  ? ?SUBJECTIVE STATEMENT: ?Pt had TKA in 10/27/2020 with therapy post surgery.  Pt  indicated she has complaints in Lt knee that were better for a period of time after surgery and care but worsened over the last month. that gets sore and stiff, worse sometimes after going to the gym.  Pt indicated having some intermittent trouble sleeping.   ? ?PERTINENT HISTORY: ?DM, HTN ? ?PAIN:  ?Are you having pain? Yes: NPRS scale: current 7/10, at worst 9/10 ?Pain location: Lt knee ?Pain description: chronic  ?Aggravating factors: post workout pain, general stiffness, prolonged walking, squatting, stairs ?Relieving factors: ointment, ice  ? ?PRECAUTIONS: None ? ?WEIGHT BEARING RESTRICTIONS  No ? ?FALLS:  ?Has patient fallen in last 6 months? No ? ?LIVING ENVIRONMENT: ?Living Environment Private residence    ?  Additional Comments 3 steps to enter-has handrail on Rt side   ? ? ?OCCUPATION: Retired ? ?PLOF:  ?Level of Independence Independent    ?  Vocation Retired   ?  Leisure works out at gym   ? ? ?PATIENT GOALS Reduce pain, walking  ? ? ?OBJECTIVE:  ? ?PATIENT SURVEYS:  ? 07/07/2021: FOTO intake: 52  predicted: 63 ? ?COGNITION: ?07/07/2021 Overall cognitive status: Within functional limits for tasks assessed   ?  ?PALPATION: ? 07/07/2021 : Mild tenderness to touch anterior knee joint, inferior patella Lt ? ?LE ROM: ? ?Active ROM Right ?07/07/2021 Left ?07/07/2021  ?Hip flexion    ?Hip extension    ?Hip abduction    ?Hip adduction    ?Hip internal rotation    ?Hip external rotation    ?Knee flexion  108 AROM supine heel slide c pain anterior knee  ?Knee extension  -5 in seated LAQ AROM ? ?0 PROM in supine heel prop  ?Ankle dorsiflexion    ?Ankle plantarflexion    ?Ankle inversion    ?Ankle eversion    ? (Blank rows = not tested) ? ?LE MMT: ? ?MMT Right ?07/07/2021 Left ?07/07/2021  ?Hip flexion 5/5 5/5  ?Hip extension    ?Hip abduction    ?Hip adduction    ?Hip internal rotation    ?Hip external rotation    ?Knee flexion 5/5 4/5  ?Knee extension 4/5 ?21.5, 18.5 lbs 4/5 ?19.1, 17.3 ?lbs c pain  ?Ankle dorsiflexion 5/5 5/5  ?Ankle plantarflexion    ?Ankle inversion    ?Ankle eversion    ? (Blank rows = not tested) ? ? ?FUNCTIONAL TESTS:  ?07/07/2021  ?TUG: independent 15.95 seconds ? Lt SLS:  3 seconds   Rt SLS: 10 seconds ? 18 inch transfer s UE assist: on second try ? ?GAIT: ?07/07/2021 ?Distance walked: Household distances within clinic  ?Assistive device utilized: Independent  ?Comments: Mild maintained knee flexion in stance Lt leg, mild reduction of stance on Lt leg.  ? ? ? ?TODAY'S TREATMENT: ?07/07/2021 ?Therex: ? HEP instruction/performance c cues for techniques, handout provided.  Trial set performed of  each for comprehension and symptom assessment.  See below for exercise list. ? ? ?PATIENT EDUCATION:  ?07/07/2021 ?Education details: HEP, POC ?Person educated: Patient ?Education method: Explanation, Demonstration, Verbal cues, and Handouts ?Education comprehension: verbalized understanding, returned demonstration, and verbal cues required ? ? ?HOME EXERCISE PROGRAM: ?07/07/2021 ?Access Code: [email or web address] ?URL: [email or web address] ?Date: 07/07/2021 ?Prepared by: Scot Jun ? ?Exercises ?- Supine Quadricep Sets  - 3-5 x daily - 7 x weekly - 1 sets - 10 reps - 5 hold ?- Supine Heel Slide (Mirrored)  - 3-5 x daily - 7 x weekly - 1 sets - 10 reps - 2 hold ?- Seated Quad Set (  Mirrored)  - 3-5 x daily - 7 x weekly - 1 sets - 10 reps - 5 hold ?- Seated Long Arc Quad (Mirrored)  - 3-5 x daily - 7 x weekly - 1 sets - 5-10 reps - 2 hold ?- Seated Straight Leg Heel Taps  - 1-2 x daily - 7 x weekly - 1-2 sets - 5-10 reps ?- Sit to Stand  - 1-2 x daily - 7 x weekly - 1-2 sets - 10 reps ? ?ASSESSMENT: ? ?CLINICAL IMPRESSION: ?Patient is a 66 y.o. who comes to clinic with complaints of Lt knee pain (history of Lt TKA 10/27/2020) with mobility, strength and movement coordination deficits that impair their ability to perform usual daily and recreational functional activities without increase difficulty/symptoms at this time.  Patient to benefit from skilled PT services to address impairments and limitations to improve to previous level of function without restriction secondary to condition.  ? ? ?OBJECTIVE IMPAIRMENTS Abnormal gait, decreased activity tolerance, decreased balance, decreased coordination, decreased endurance, decreased mobility, difficulty walking, decreased ROM, decreased strength, hypomobility, increased edema, impaired perceived functional ability, impaired flexibility, improper body mechanics, and pain.  ? ?ACTIVITY LIMITATIONS cleaning, community activity, meal prep, laundry, and shopping.   ? ?PERSONAL FACTORS  HTN, hyperlipidemia, GERD, DM, RA  are also affecting patient's functional outcome.  ? ? ?REHAB POTENTIAL: Fair to good ? ?CLINICAL DECISION MAKING: Stable/uncomplicated ? ?EVALUATION COMPLEX

## 2021-07-07 ENCOUNTER — Other Ambulatory Visit: Payer: Self-pay

## 2021-07-07 ENCOUNTER — Ambulatory Visit (INDEPENDENT_AMBULATORY_CARE_PROVIDER_SITE_OTHER): Payer: Medicare Other | Admitting: Rehabilitative and Restorative Service Providers"

## 2021-07-07 ENCOUNTER — Ambulatory Visit: Payer: Medicare Other | Admitting: Physical Therapy

## 2021-07-07 ENCOUNTER — Encounter: Payer: Self-pay | Admitting: Rehabilitative and Restorative Service Providers"

## 2021-07-07 DIAGNOSIS — R262 Difficulty in walking, not elsewhere classified: Secondary | ICD-10-CM | POA: Diagnosis not present

## 2021-07-07 DIAGNOSIS — M6281 Muscle weakness (generalized): Secondary | ICD-10-CM

## 2021-07-07 DIAGNOSIS — R600 Localized edema: Secondary | ICD-10-CM | POA: Diagnosis not present

## 2021-07-07 DIAGNOSIS — M25562 Pain in left knee: Secondary | ICD-10-CM

## 2021-07-07 DIAGNOSIS — G8929 Other chronic pain: Secondary | ICD-10-CM

## 2021-07-07 NOTE — Patient Instructions (Addendum)
Visit Information ?It was great speaking with you today!  Please let me know if you have any questions about our visit. ? ? Goals Addressed   ? ?  ?  ?  ?  ? This Visit's Progress  ?  Manage My Medicine     ?  Timeframe:  Long-Range Goal ?Priority:  High ?Start Date:                             ?Expected End Date:                      ? ?Follow Up Date 01/26/2022 ? ? ?In Progress:   ?- call if I am sick and can't take my medicine ?- please try to take your medications at the same time each day ?- use a pillbox to sort medicine ?- use an alarm clock or phone to remind me to take my medicine  ?  ?Why is this important?   ?These steps will help you keep on track with your medicines. ? ?  ? ?  ? ?  ? ? ?Patient Care Plan: Leslie Davis  ?  ? ?Problem Identified: HTN, HLD, DM II   ?Priority: High  ?  ? ?Long-Range Goal: DISEASE MANAGEMENT   ?This Visit's Progress: On track  ?Recent Progress: On track  ?Priority: High  ?Note:   ?Current Barriers:  ?Unable to independently monitor therapeutic efficacy ? ?Pharmacist Clinical Goal(s):  ?Patient will achieve adherence to monitoring guidelines and medication adherence to achieve therapeutic efficacy through collaboration with PharmD and provider.  ? ?Interventions: ?1:1 collaboration with [name], MD regarding development and update of comprehensive plan of care as evidenced by provider attestation and co-signature ?Inter-disciplinary care team collaboration (see longitudinal plan of care) ?Comprehensive medication review performed; medication list updated in electronic medical record ? ?Hypertension (BP goal <130/80) ?-Uncontrolled ?-Current treatment: ?Amlodipine 10 taking 1 tablet by mouth daily Appropriate, Query effective, ?Edarbi 80 mg tablet once per day Appropriate, Query effective ?Chlorthalidone 25 mg tablet - taking 1/2 tablet by mouth daily. Appropriate, Query effective,  ?-Current home readings: 135/66, patient reports that she is writing down her  readings  ?-Current dietary habits: is not using a lot of salt.  ?-Denies hypotensive/hypertensive symptoms ?-Educated on Daily salt intake goal < 2300 mg; ?Importance of home blood pressure monitoring; ?-Recommended patient limit the amount of Ibuprofen that she takes ?-Counseled to monitor BP at home checking bp at least four time per week, document, and provide log at future appointments ?-Recommended to continue current medication ? ? ?Hyperlipidemia: (LDL goal < 55) ?-Uncontrolled ?-Current treatment: ?Rosuvastatin 20 mg tablet once per day Appropriate, Query effective ? ?-Current dietary patterns: patient to limit the amount of fried and fatty foods that she is eating.  ?-Current exercise habits: at this time she is not exercising.  ?-Educated on Benefits of statin for ASCVD risk reduction; ?Importance of limiting foods high in cholesterol; ?-Collaborate with PCP team to change patients current statin medication to Atorvastatin 10 mg tablet by mouth daily.  ? ?Diabetes (A1c goal <7%) ?-Controlled ?-Current medications: ?Ozempic 2 mg once per week Sunday Appropriate, Effective, Safe, Accessible ?Farxiga 10 mg tablet once per day Appropriate, Effective, Safe, Accessible ?-Current home glucose readings: she is checking her BS every day  ?fasting glucose: 115, 90-something ?-Denies hypoglycemic/hyperglycemic symptoms ?-Current meal patterns:  ?drinks: she is drinking mostly of water, she reports drinking  water once or twice per week  ?-Current exercise: patient reports that she is not exercising  ?-Educated on A1c and blood sugar goals; ?Exercise goal of 150 minutes per week; ?Carbohydrate counting and/or plate method ?-Congratulated patient on her A1c  ?-Counseled to check feet daily and get yearly eye exams ?-Recommended to continue current medication ? ?Patient Goals/Self-Care Activities ?Patient will:  ?- take medications as prescribed as evidenced by patient report and record review ? ?Follow Up Plan: The  patient has been provided with contact information for the care management team and has been advised to call with any health related questions or concerns.  ?  ? ? ?Patient agreed to services and verbal consent obtained.  ? ?The patient verbalized understanding of instructions, educational materials, and care plan provided today and agreed to receive a mailed copy of patient instructions, educational materials, and care plan.  ? ?[name], PharmD ?Clinical Pharmacist ?[facility] ?[phone] ?  ?

## 2021-07-09 ENCOUNTER — Encounter: Payer: Self-pay | Admitting: Cardiology

## 2021-07-09 ENCOUNTER — Ambulatory Visit: Payer: Medicare Other | Admitting: Cardiology

## 2021-07-09 VITALS — BP 138/62 | HR 56 | Temp 98.7°F | Resp 16 | Ht 64.0 in | Wt 168.8 lb

## 2021-07-09 DIAGNOSIS — I10 Essential (primary) hypertension: Secondary | ICD-10-CM | POA: Diagnosis not present

## 2021-07-09 DIAGNOSIS — I2584 Coronary atherosclerosis due to calcified coronary lesion: Secondary | ICD-10-CM | POA: Diagnosis not present

## 2021-07-09 DIAGNOSIS — R072 Precordial pain: Secondary | ICD-10-CM | POA: Diagnosis not present

## 2021-07-09 DIAGNOSIS — E1169 Type 2 diabetes mellitus with other specified complication: Secondary | ICD-10-CM

## 2021-07-09 DIAGNOSIS — I2510 Atherosclerotic heart disease of native coronary artery without angina pectoris: Secondary | ICD-10-CM | POA: Diagnosis not present

## 2021-07-09 DIAGNOSIS — I700 Atherosclerosis of aorta: Secondary | ICD-10-CM

## 2021-07-09 DIAGNOSIS — E1122 Type 2 diabetes mellitus with diabetic chronic kidney disease: Secondary | ICD-10-CM | POA: Diagnosis not present

## 2021-07-09 DIAGNOSIS — E785 Hyperlipidemia, unspecified: Secondary | ICD-10-CM | POA: Diagnosis not present

## 2021-07-09 DIAGNOSIS — R0609 Other forms of dyspnea: Secondary | ICD-10-CM | POA: Diagnosis not present

## 2021-07-09 DIAGNOSIS — Z794 Long term (current) use of insulin: Secondary | ICD-10-CM | POA: Diagnosis not present

## 2021-07-09 DIAGNOSIS — N1831 Chronic kidney disease, stage 3a: Secondary | ICD-10-CM | POA: Diagnosis not present

## 2021-07-09 MED ORDER — METOPROLOL TARTRATE 25 MG PO TABS: 25.0000 mg | ORAL_TABLET | Freq: Two times a day (BID) | ORAL | 0 refills | Status: DC

## 2021-07-09 NOTE — Progress Notes (Signed)
? ?ID:  Leslie Davis, DOB February 27, 1956, MRN [id number] ? ?PCP:  [name], MD  ?Cardiologist:  Rex Kras, DO, [facility] (established care July 09, 2021) ?Former Cardiology Providers: Dr. Minus Breeding.  ? ?REASON FOR CONSULT: Shortness of breath and chest tightness  ? ?REQUESTING PHYSICIAN:  ?[name], MD ?[address] ?[address] ?[place],  [place] [address] ? ?Chief Complaint  ?Patient presents with  ? New Patient (Initial Visit)  ? Hospitalization Follow-up  ? ? ?HPI  ?Leslie Davis is a 66 y.o. African-American female whose past medical history and cardiovascular risk factors include: Coronary artery calcification, hypertension, hyperlipidemia, GERD, fatty liver disease, IDDM Type II, nephrolithiasis, advanced age. ? ?Patient is referred to the practice by ED physician at [facility] after recent ER visit for weakness/shortness of breath/chest tightness. ? ?Patient initial concern was generalized weakness and fatigue.  She underwent laboratory testing while in the ED and was noted to have an elevated D-dimer.  She subsequently underwent a CT PE protocol which was negative for pulm embolism but did mention coronary artery calcification and aortic atherosclerosis.  High sensitive troponins were within normal limits and patient was discharged home with follow-up with cardiology.  She has not had a chance to follow-up with her PCP status post her ED visit. ? ?She is not the best historian and not able to provide a very accurate history of present illness but has been experiencing generalized weakness and tired.  She states that when she exerts herself she has dyspnea on exertion and feels tightness in the chest.  The symptoms have been going on for the last 2 weeks but no active symptoms. ? ?Her last stress test was in 2020 at [facility] results reviewed independently via [place] and noted below for further reference. ? ?Her overall physical endurance has reduced  compared to the past.  Patient states that she just feels tired / fatigue / at times near syncope. Denies syncopal event.  ? ?ALLERGIES: ?Allergies  ?Allergen Reactions  ? Fiasp [Insulin Aspart (W-Niacinamide)] Hives  ? ? ?MEDICATION LIST PRIOR TO VISIT: ?Current Meds  ?Medication Sig  ? acetaminophen-codeine (TYLENOL #3) 300-30 MG tablet Take 1 tablet by mouth every 8 (eight) hours as needed for moderate pain.  ? amLODipine (NORVASC) 10 MG tablet TAKE ONE TABLET BY MOUTH ONCE DAILY  ? aspirin EC 81 MG tablet Take 1 tablet (81 mg total) by mouth 2 (two) times daily. To be taken after surgery  ? Azilsartan Medoxomil (EDARBI) 80 MG TABS TAKE ONE TABLET BY MOUTH ONCE DAILY  ? Blood Glucose Monitoring Suppl (ONETOUCH VERIO) w/Device KIT Use as directed to check blood sugars 2 times per day dx: e11.65  ? cetirizine (ZYRTEC) 10 MG tablet Take 1 tablet (10 mg total) by mouth daily.  ? dapagliflozin propanediol (FARXIGA) 10 MG TABS tablet Take 1 tablet (10 mg total) by mouth daily before breakfast.  ? ferrous gluconate (FERGON) 324 MG tablet TAKE 1 TABLET BY MOUTH TWICE DAILY WITH A MEAL. (Patient taking differently: Take 324 mg by mouth 2 (two) times daily with a meal.)  ? glucose blood (ONETOUCH VERIO) test strip Use as instructed to check blood sugars 2 times per day dx:e11.65  ? insulin lispro (HUMALOG KWIKPEN) 100 UNIT/ML KwikPen Sliding scale  ? linaclotide ([name]) 145 MCG CAPS capsule Take 1 capsule (145 mcg total) by mouth daily.  ? methocarbamol (ROBAXIN) 500 MG tablet Take 1 tablet (500 mg total) by mouth 2 (two) times  daily.  ? metoprolol tartrate (LOPRESSOR) 25 MG tablet Take 1 tablet (25 mg total) by mouth 2 (two) times daily for 7 days. Start two days before your CT Scan.  ? OneTouch Delica Lancets 82U MISC Use as directed to check blood sugars 2 times per day dx:e11.65  ? pantoprazole (PROTONIX) 40 MG tablet Take one tablet by mouth 3 days a week  ? rosuvastatin (CRESTOR) 20 MG tablet TAKE ONE TABLET BY  MOUTH ONCE DAILY  ? Semaglutide, 2 MG/DOSE, 8 MG/3ML SOPN Inject 2 mg as directed once a week.  ? temazepam (RESTORIL) 30 MG capsule TAKE ONE CAPSULE BY MOUTH EVERYDAY AT BEDTIME AS NEEDED  ? TRUEPLUS PEN NEEDLES 31G X 6 MM MISC USE WITH pen TO INJECT insulin DAILY  ?  ? ?PAST MEDICAL HISTORY: ?Past Medical History:  ?Diagnosis Date  ? DM (diabetes mellitus) ([place])   ? Fatty liver disease, nonalcoholic   ? Gastritis   ? Gastroparesis   ? GERD (gastroesophageal reflux disease)   ? Heartburn   ? Hiatal hernia   ? HLD (hyperlipidemia)   ? HTN (hypertension)   ? Joint pain   ? Renal calculus   ? Rheumatoid arthritis ([place])   ? Vitamin D deficiency   ? ? ?PAST SURGICAL HISTORY: ?Past Surgical History:  ?Procedure Laterality Date  ? CHOLECYSTECTOMY    ? COLONOSCOPY    ? TOTAL KNEE ARTHROPLASTY Left 10/27/2020  ? Procedure: LEFT TOTAL KNEE ARTHROPLASTY;  Surgeon: [name], MD;  Location: [facility];  Service: Orthopedics;  Laterality: Left;  ? TUBAL LIGATION    ? UPPER GASTROINTESTINAL ENDOSCOPY    ? ? ?FAMILY HISTORY: ?The patient family history includes COPD in her sister; Diabetes in her brother, maternal grandmother, mother, and sister; Heart disease in her sister; Heart disease (age of onset: 37) in her brother; Hyperlipidemia in her father; Hypertension in her father; Kidney disease in her mother and sister; Stomach cancer in her maternal aunt; Stroke in her father; Sudden death in her father; Thyroid disease in her mother. ? ?SOCIAL HISTORY:  ?The patient  reports that she quit smoking about 46 years ago. Her smoking use included cigarettes. She has a 0.25 pack-year smoking history. She has never used smokeless tobacco. She reports that she does not drink alcohol and does not use drugs. ? ?REVIEW OF SYSTEMS: ?Review of Systems  ?Constitutional: Positive for malaise/fatigue.  ?Cardiovascular:  Positive for chest pain (tightness), dyspnea on exertion and near-syncope. Negative for leg swelling, palpitations, paroxysmal  nocturnal dyspnea and syncope.  ?Respiratory:  Positive for shortness of breath.   ? ?PHYSICAL EXAM: ? ?  07/09/2021  ?  9:01 AM 06/25/2021  ?  5:39 PM 06/25/2021  ?  2:45 PM  ?Vitals with BMI  ?Height _0     ?Weight 168 lbs 13 oz    ?BMI 28.96    ?Systolic 235 361 443  ?Diastolic 62 58 67  ?Pulse 56 68 40  ? ? ?CONSTITUTIONAL: Well-developed and well-nourished. No acute distress.  ?SKIN: Skin is warm and dry. No rash noted. No cyanosis. No pallor. No jaundice ?HEAD: Normocephalic and atraumatic.  ?EYES: No scleral icterus ?MOUTH/THROAT: Moist oral membranes.  ?NECK: No JVD present. No thyromegaly noted. No carotid bruits  ?LYMPHATIC: No visible cervical adenopathy.  ?CHEST Normal respiratory effort. No intercostal retractions  ?LUNGS: Clear to auscultation bilaterally.  No stridor. No wheezes. No rales.  ?CARDIOVASCULAR: Regular rate and rhythm, positive S1-S2, no murmurs rubs or gallops appreciated. ?ABDOMINAL: Obese,  soft, nontender, nondistended, positive bowel sounds all 4 quadrants. No apparent ascites.  ?EXTREMITIES: No peripheral edema, warm to touch, +2 bilateral DP and PT pulses ?HEMATOLOGIC: No significant bruising ?NEUROLOGIC: Oriented to person, place, and time. Nonfocal. Normal muscle tone.  ?PSYCHIATRIC: Normal mood and affect. Normal behavior. Cooperative ? ?RADIOLOGY ?CTA PE protocol: ?June 25, 2021 ?1. No pulmonary embolus or acute intrathoracic abnormality. ?2. Coronary artery calcifications. ?Aortic Atherosclerosis (ICD10-I70.0) ? ?CARDIAC DATABASE: ?EKG: ?07/09/2021: Normal sinus rhythm, 60 bpm, old anteroseptal infarct, TWI in lateral leads consider possible ischemia.  ? ?Echocardiogram: ?No results found for this or any previous visit from the past 1095 days. ?  ?Stress Testing: ?11/03/2018 [facility] available in Care Everywhere: ?Per report: No reversible ischemia or infarction, normal wall motion, calculated LVEF 84%, low risk study ? ?Heart  Catheterization: ?None ? ?LABORATORY DATA: ? ?  Latest Ref Rng & Units 06/25/2021  ?  1:50 PM 12/23/2020  ?  3:22 PM 11/20/2020  ? 11:32 AM  ?CBC  ?WBC 4.0 - 10.5 K/uL 4.5   5.1   4.4    ?Hemoglobin 12.0 - 15.0 g/dL 13.6   11.2   11

## 2021-07-14 ENCOUNTER — Ambulatory Visit (INDEPENDENT_AMBULATORY_CARE_PROVIDER_SITE_OTHER): Payer: Medicare Other | Admitting: Family Medicine

## 2021-07-14 ENCOUNTER — Encounter (INDEPENDENT_AMBULATORY_CARE_PROVIDER_SITE_OTHER): Payer: Self-pay | Admitting: Family Medicine

## 2021-07-14 VITALS — BP 135/64 | HR 54 | Temp 98.1°F | Ht 64.0 in | Wt 165.0 lb

## 2021-07-14 DIAGNOSIS — Z794 Long term (current) use of insulin: Secondary | ICD-10-CM | POA: Diagnosis not present

## 2021-07-14 DIAGNOSIS — I152 Hypertension secondary to endocrine disorders: Secondary | ICD-10-CM

## 2021-07-14 DIAGNOSIS — Z6828 Body mass index (BMI) 28.0-28.9, adult: Secondary | ICD-10-CM

## 2021-07-14 DIAGNOSIS — K5909 Other constipation: Secondary | ICD-10-CM

## 2021-07-14 DIAGNOSIS — E1169 Type 2 diabetes mellitus with other specified complication: Secondary | ICD-10-CM

## 2021-07-14 DIAGNOSIS — E1159 Type 2 diabetes mellitus with other circulatory complications: Secondary | ICD-10-CM | POA: Diagnosis not present

## 2021-07-14 DIAGNOSIS — E669 Obesity, unspecified: Secondary | ICD-10-CM

## 2021-07-15 ENCOUNTER — Encounter: Payer: Medicare Other | Admitting: Rehabilitative and Restorative Service Providers"

## 2021-07-15 ENCOUNTER — Ambulatory Visit: Payer: Medicare Other

## 2021-07-15 ENCOUNTER — Telehealth: Payer: Self-pay | Admitting: Rehabilitative and Restorative Service Providers"

## 2021-07-15 DIAGNOSIS — R0609 Other forms of dyspnea: Secondary | ICD-10-CM

## 2021-07-15 DIAGNOSIS — R072 Precordial pain: Secondary | ICD-10-CM | POA: Diagnosis not present

## 2021-07-15 NOTE — Telephone Encounter (Signed)
Called Pt after no show for appointment today.  Pt thought it was tomorrow but it was actually today.  Reminded of next week visit.  ? ?Scot Jun, PT, DPT, OCS, ATC ?07/15/21  9:08 AM ? ? ?

## 2021-07-19 DIAGNOSIS — E1169 Type 2 diabetes mellitus with other specified complication: Secondary | ICD-10-CM | POA: Diagnosis not present

## 2021-07-19 DIAGNOSIS — M1712 Unilateral primary osteoarthritis, left knee: Secondary | ICD-10-CM | POA: Diagnosis not present

## 2021-07-19 DIAGNOSIS — N1831 Chronic kidney disease, stage 3a: Secondary | ICD-10-CM

## 2021-07-19 DIAGNOSIS — E785 Hyperlipidemia, unspecified: Secondary | ICD-10-CM

## 2021-07-19 DIAGNOSIS — Z794 Long term (current) use of insulin: Secondary | ICD-10-CM

## 2021-07-19 DIAGNOSIS — N182 Chronic kidney disease, stage 2 (mild): Secondary | ICD-10-CM | POA: Diagnosis not present

## 2021-07-19 DIAGNOSIS — E1122 Type 2 diabetes mellitus with diabetic chronic kidney disease: Secondary | ICD-10-CM | POA: Diagnosis not present

## 2021-07-19 DIAGNOSIS — I129 Hypertensive chronic kidney disease with stage 1 through stage 4 chronic kidney disease, or unspecified chronic kidney disease: Secondary | ICD-10-CM

## 2021-07-20 DIAGNOSIS — I2510 Atherosclerotic heart disease of native coronary artery without angina pectoris: Secondary | ICD-10-CM

## 2021-07-20 HISTORY — DX: Atherosclerotic heart disease of native coronary artery without angina pectoris: I25.10

## 2021-07-21 ENCOUNTER — Other Ambulatory Visit: Payer: Self-pay | Admitting: Internal Medicine

## 2021-07-21 ENCOUNTER — Other Ambulatory Visit (INDEPENDENT_AMBULATORY_CARE_PROVIDER_SITE_OTHER): Payer: Self-pay | Admitting: Family Medicine

## 2021-07-21 ENCOUNTER — Telehealth (HOSPITAL_COMMUNITY): Payer: Self-pay | Admitting: *Deleted

## 2021-07-21 DIAGNOSIS — G4700 Insomnia, unspecified: Secondary | ICD-10-CM

## 2021-07-21 DIAGNOSIS — Z794 Long term (current) use of insulin: Secondary | ICD-10-CM

## 2021-07-21 NOTE — Telephone Encounter (Signed)
Attempted to call patient regarding upcoming cardiac CT appointment. °Left message on voicemail with name and callback number ° °Elroy Schembri RN Navigator Cardiac Imaging °[facility] °[phone] Office °[phone] Cell ° °

## 2021-07-22 ENCOUNTER — Encounter: Payer: Self-pay | Admitting: Physical Therapy

## 2021-07-22 ENCOUNTER — Ambulatory Visit (HOSPITAL_COMMUNITY)
Admission: RE | Admit: 2021-07-22 | Discharge: 2021-07-22 | Disposition: A | Discharge status: Home / Self Care | Payer: Medicare Other | Source: Ambulatory Visit | Attending: Internal Medicine | Admitting: Internal Medicine

## 2021-07-22 ENCOUNTER — Ambulatory Visit (INDEPENDENT_AMBULATORY_CARE_PROVIDER_SITE_OTHER): Payer: Medicare Other | Admitting: Physical Therapy

## 2021-07-22 DIAGNOSIS — G8929 Other chronic pain: Secondary | ICD-10-CM | POA: Diagnosis not present

## 2021-07-22 DIAGNOSIS — R072 Precordial pain: Secondary | ICD-10-CM | POA: Insufficient documentation

## 2021-07-22 DIAGNOSIS — R600 Localized edema: Secondary | ICD-10-CM | POA: Diagnosis not present

## 2021-07-22 DIAGNOSIS — I2510 Atherosclerotic heart disease of native coronary artery without angina pectoris: Secondary | ICD-10-CM | POA: Diagnosis not present

## 2021-07-22 DIAGNOSIS — M25662 Stiffness of left knee, not elsewhere classified: Secondary | ICD-10-CM | POA: Diagnosis not present

## 2021-07-22 DIAGNOSIS — R262 Difficulty in walking, not elsewhere classified: Secondary | ICD-10-CM

## 2021-07-22 DIAGNOSIS — M6281 Muscle weakness (generalized): Secondary | ICD-10-CM

## 2021-07-22 DIAGNOSIS — R2689 Other abnormalities of gait and mobility: Secondary | ICD-10-CM | POA: Diagnosis not present

## 2021-07-22 DIAGNOSIS — M25562 Pain in left knee: Secondary | ICD-10-CM

## 2021-07-22 DIAGNOSIS — I700 Atherosclerosis of aorta: Secondary | ICD-10-CM | POA: Diagnosis not present

## 2021-07-22 MED ORDER — IOHEXOL 350 MG/ML SOLN
100.0000 mL | Freq: Once | INTRAVENOUS | Status: AC | PRN
  Administered 2021-07-22: 100 mL via INTRAVENOUS

## 2021-07-22 MED ORDER — NITROGLYCERIN 0.4 MG SL SUBL
SUBLINGUAL_TABLET | SUBLINGUAL | Status: DC
Start: 2021-07-22 — End: 2021-07-23
  Filled 2021-07-22: qty 2

## 2021-07-22 MED ORDER — NITROGLYCERIN 0.4 MG SL SUBL
0.8000 mg | SUBLINGUAL_TABLET | Freq: Once | SUBLINGUAL | Status: AC
Start: 2021-07-22 — End: 2021-07-22
  Administered 2021-07-22: 0.8 mg via SUBLINGUAL

## 2021-07-22 NOTE — Therapy (Signed)
?OUTPATIENT PHYSICAL THERAPY TREATMENT NOTE ? ? ?Patient Name: Leslie Davis ?MRN: [id number] ?DOB:08-31-1955, 66 y.o., female ?Today's Date: 07/22/2021 ? ?PCP: [name], MD ?REFERRING PROVIDER: Aundra Dubin, PA-C ? ?END OF SESSION:  ? PT End of Session - 07/22/21 1024   ? ? Visit Number 2   ? Number of Visits 20   ? Date for PT Re-Evaluation 09/15/21   ? Authorization Type [employer] Medicare 20% Coinsurance   ? Progress Note Due on Visit 10   ? PT Start Time 1015   ? PT Stop Time 1055   ? PT Time Calculation (min) 40 min   ? Activity Tolerance Patient tolerated treatment well   ? Behavior During Therapy [facility] for tasks assessed/performed   ? ?  ?  ? ?  ? ? ?Past Medical History:  ?Diagnosis Date  ? DM (diabetes mellitus) ([place])   ? Fatty liver disease, nonalcoholic   ? Gastritis   ? Gastroparesis   ? GERD (gastroesophageal reflux disease)   ? Heartburn   ? Hiatal hernia   ? HLD (hyperlipidemia)   ? HTN (hypertension)   ? Joint pain   ? Renal calculus   ? Rheumatoid arthritis ([place])   ? Vitamin D deficiency   ? ?Past Surgical History:  ?Procedure Laterality Date  ? CHOLECYSTECTOMY    ? COLONOSCOPY    ? TOTAL KNEE ARTHROPLASTY Left 10/27/2020  ? Procedure: LEFT TOTAL KNEE ARTHROPLASTY;  Surgeon: [name], MD;  Location: [place];  Service: Orthopedics;  Laterality: Left;  ? TUBAL LIGATION    ? UPPER GASTROINTESTINAL ENDOSCOPY    ? ?Patient Active Problem List  ? Diagnosis Date Noted  ? Other constipation 05/25/2021  ? Class 2 severe obesity with serious comorbidity and body mass index (BMI) of 35.0 to 35.9 in adult [facility]) 12/01/2020  ? Status post total left knee replacement 10/27/2020  ? Primary osteoarthritis of left knee   ? NAFLD (nonalcoholic fatty liver disease) 08/06/2020  ? Other fatigue 07/23/2020  ? SOBOE (shortness of breath on exertion) 07/23/2020  ? Diabetes mellitus ([place]) 07/23/2020  ? Hypertension associated with type 2 diabetes mellitus ([place]) 07/23/2020  ? Hyperlipidemia associated with type 2  diabetes mellitus ([place]) 07/23/2020  ? Absolute anemia 07/23/2020  ? Vitamin D deficiency 07/23/2020  ? At risk for heart disease 07/23/2020  ? AKI (acute kidney injury) ([place]) 09/07/2019  ? Acute kidney injury ([place]) 09/06/2019  ? Bradycardia 09/06/2019  ? Hyperglycemia 09/06/2019  ? HLD (hyperlipidemia)   ? Hypertensive nephropathy 05/16/2019  ? Erosive gastritis 11/16/2017  ? Fatty liver 11/16/2017  ? Abdominal pain 12/11/2012  ? Early satiety 12/11/2012  ? Hiatal hernia   ? Hx of cholecystectomy 02/05/2011  ? Flu-like symptoms 02/05/2011  ? CONSTIPATION 08/27/2008  ? FATTY LIVER DISEASE 05/07/2008  ? Nausea with vomiting 04/23/2008  ? EPIGASTRIC PAIN 04/23/2008  ? RENAL CALCULUS 04/22/2008  ? Type 2 diabetes mellitus with stage 2 chronic kidney disease, with long-term current use of insulin ([place]) 05/19/2006  ? Type 2 diabetes mellitus with hyperlipidemia ([place]) 05/19/2006  ? HYPERTENSION, BENIGN SYSTEMIC 05/19/2006  ? GASTROESOPHAGEAL REFLUX, NO ESOPHAGITIS 05/19/2006  ? AMENORRHEA 05/19/2006  ? ? ?REFERRING DIAG: [id number] (ICD-10-CM) - Status post total left knee replacement ? ?THERAPY DIAG:  ?Chronic pain of left knee ? ?Muscle weakness (generalized) ? ?Difficulty in walking, not elsewhere classified ? ?Localized edema ? ?Stiffness of left knee, not elsewhere classified ? ?Other abnormalities of gait and mobility ? ?PERTINENT HISTORY: DM,  HTN ? ?PRECAUTIONS: None ? ?SUBJECTIVE: Doing okay today, "my knee still gives me trouble" ? ?PAIN:  ?Are you having pain? Yes: NPRS scale: 8 currently/10 ?Pain location: Lt knee ?Pain description: aching, tightness ?Aggravating factors: walking ?Relieving factors: medication ? ? ?OBJECTIVE: (objective measures completed at initial evaluation unless otherwise dated) ? ? ?PATIENT SURVEYS:  ? 07/07/2021: FOTO intake: 52  predicted: 63  ?            ?PALPATION: ? 07/07/2021 : Mild tenderness to touch anterior knee joint, inferior patella Lt ?  ?LE ROM: ?  ?Active ROM Right ?07/07/2021  Left ?07/07/2021  ?Hip flexion      ?Hip extension      ?Hip abduction      ?Hip adduction      ?Hip internal rotation      ?Hip external rotation      ?Knee flexion   108 AROM supine heel slide c pain anterior knee  ?Knee extension   -5 in seated LAQ AROM ?  ?0 PROM in supine heel prop  ?Ankle dorsiflexion      ?Ankle plantarflexion      ?Ankle inversion      ?Ankle eversion      ? (Blank rows = not tested) ?  ?LE MMT: ?  ?MMT Right ?07/07/2021 Left ?07/07/2021  ?Hip flexion 5/5 5/5  ?Hip extension      ?Hip abduction      ?Hip adduction      ?Hip internal rotation      ?Hip external rotation      ?Knee flexion 5/5 4/5  ?Knee extension 4/5 ?21.5, 18.5 lbs 4/5 ?19.1, 17.3 ?lbs c pain  ?Ankle dorsiflexion 5/5 5/5  ?Ankle plantarflexion      ?Ankle inversion      ?Ankle eversion      ? (Blank rows = not tested) ?  ?  ?FUNCTIONAL TESTS:  ?07/07/2021         ?TUG: independent 15.95 seconds ?            Lt SLS:  3 seconds   Rt SLS: 10 seconds ?            18 inch transfer s UE assist: on second try ?  ?GAIT: ?07/07/2021 ?Distance walked: Household distances within clinic  ?Assistive device utilized: Independent  ?Comments: Mild maintained knee flexion in stance Lt leg, mild reduction of stance on Lt leg.  ?  ?  ?  ?TODAY'S TREATMENT: ?07/22/21 ?Therex: ?     Aerobic: ?NuStep L6 x 10 min ?    Sitting: ?Sit to/from stand x 10 reps - push off from UEs to stand ?Seated SLR on Lt 2x10 reps ?Lt quad set x10 reps ?Lt LAQ 2x10; 4# with 5 sec hold ?    Supine: ?Quad set x 10 reps (long sitting and with bil quad set) ?Bridges x 10 reps ?Heelslide x 10 reps on Lt ?Single limb clamshell x 10 reps bil with L4 band ? ? ? ?07/07/2021 ?Therex: ?           HEP instruction/performance c cues for techniques, handout provided.  Trial set performed of each for comprehension and symptom assessment.  See below for exercise list. ?  ?  ?PATIENT EDUCATION:  ?07/07/2021 ?Education details: HEP, POC ?Person educated: Patient ?Education method: Explanation,  Demonstration, Verbal cues, and Handouts ?Education comprehension: verbalized understanding, returned demonstration, and verbal cues required ?  ?  ?HOME EXERCISE PROGRAM: ?07/07/2021 ?Access Code: [email or web address] ?URL:  [email or web address] ?Date: 07/07/2021 ?Prepared by: Scot Jun ?  ?Exercises ?- Supine Quadricep Sets  - 3-5 x daily - 7 x weekly - 1 sets - 10 reps - 5 hold ?- Supine Heel Slide (Mirrored)  - 3-5 x daily - 7 x weekly - 1 sets - 10 reps - 2 hold ?- Seated Quad Set (Mirrored)  - 3-5 x daily - 7 x weekly - 1 sets - 10 reps - 5 hold ?- Seated Long Arc Quad (Mirrored)  - 3-5 x daily - 7 x weekly - 1 sets - 5-10 reps - 2 hold ?- Seated Straight Leg Heel Taps  - 1-2 x daily - 7 x weekly - 1-2 sets - 5-10 reps ?- Sit to Stand  - 1-2 x daily - 7 x weekly - 1-2 sets - 10 reps ?  ?ASSESSMENT: ?  ?CLINICAL IMPRESSION: ?Session today focused on review of HEP and hip/quad strengthening.  She needed mod cues to complete HEP at this time.  Will continue to benefit from PT to maximize function. ?  ?OBJECTIVE IMPAIRMENTS Abnormal gait, decreased activity tolerance, decreased balance, decreased coordination, decreased endurance, decreased mobility, difficulty walking, decreased ROM, decreased strength, hypomobility, increased edema, impaired perceived functional ability, impaired flexibility, improper body mechanics, and pain.  ?  ?ACTIVITY LIMITATIONS cleaning, community activity, meal prep, laundry, and shopping.  ?  ?PERSONAL FACTORS  HTN, hyperlipidemia, GERD, DM, RA  are also affecting patient's functional outcome.  ?  ?  ?REHAB POTENTIAL: Fair to good ?  ?CLINICAL DECISION MAKING: Stable/uncomplicated ?  ?EVALUATION COMPLEXITY: Low ?  ?  ?GOALS: ?Goals reviewed with patient? Yes ?Eval: 07/07/2021 ?Short term PT Goals (target date for Short term goals are 3 weeks 07/28/2021) ?Patient will demonstrate independent use of home exercise program to maintain progress from in clinic treatments. ?Goal status:  New ?  ?Long term PT goals (target dates for all long term goals are 10 weeks  09/15/2021 ) ?  ?1. Patient will demonstrate/report pain at worst less than or equal to 2/10 to facilitate minimal limitation in da

## 2021-07-26 ENCOUNTER — Other Ambulatory Visit: Payer: Self-pay | Admitting: Cardiology

## 2021-07-26 ENCOUNTER — Ambulatory Visit (HOSPITAL_COMMUNITY)
Admission: RE | Admit: 2021-07-26 | Discharge: 2021-07-26 | Disposition: A | Discharge status: Home / Self Care | Payer: Medicare Other | Source: Ambulatory Visit | Attending: Cardiology | Admitting: Cardiology

## 2021-07-26 DIAGNOSIS — R072 Precordial pain: Secondary | ICD-10-CM | POA: Diagnosis not present

## 2021-07-26 DIAGNOSIS — R931 Abnormal findings on diagnostic imaging of heart and coronary circulation: Secondary | ICD-10-CM

## 2021-07-28 ENCOUNTER — Encounter: Payer: Self-pay | Admitting: Internal Medicine

## 2021-07-29 ENCOUNTER — Encounter: Payer: Self-pay | Admitting: Rehabilitative and Restorative Service Providers"

## 2021-07-29 ENCOUNTER — Ambulatory Visit (INDEPENDENT_AMBULATORY_CARE_PROVIDER_SITE_OTHER): Payer: Medicare Other | Admitting: Rehabilitative and Restorative Service Providers"

## 2021-07-29 DIAGNOSIS — R600 Localized edema: Secondary | ICD-10-CM

## 2021-07-29 DIAGNOSIS — G8929 Other chronic pain: Secondary | ICD-10-CM | POA: Diagnosis not present

## 2021-07-29 DIAGNOSIS — M25562 Pain in left knee: Secondary | ICD-10-CM | POA: Diagnosis not present

## 2021-07-29 DIAGNOSIS — M6281 Muscle weakness (generalized): Secondary | ICD-10-CM | POA: Diagnosis not present

## 2021-07-29 DIAGNOSIS — R262 Difficulty in walking, not elsewhere classified: Secondary | ICD-10-CM | POA: Diagnosis not present

## 2021-07-29 NOTE — Progress Notes (Signed)
? ? ? ?Chief Complaint:  ? ?OBESITY ?Leslie Davis is here to discuss her progress with her obesity treatment plan along with follow-up of her obesity related diagnoses. Leslie Davis is on the Category 1 Plan with breakfast options and states she is following her eating plan approximately 90% of the time. Leslie Davis states she is not currently exercising. ? ?Today's visit was #: 54 ?Starting weight: 208 lbs ?Starting date: 07/23/2020 ?Today's weight: 165 lbs ?Today's date: 07/14/2021 ?Total lbs lost to date: 25 ?Total lbs lost since last in-office visit: 0 ? ?Interim History: Leslie Davis ate at a couple of  birthday parties. She ate chinese take out etc. ? ?Subjective:  ? ?1. Hypertension associated with type 2 diabetes mellitus ([place]) ?Leslie Davis's at home blood pressure in 130's /60's regularly, checking twice a day. She took herself off Chlorthalidone due to side effects that renal doc started her on.  ? ?2. Type 2 diabetes mellitus with other specified complication, with long-term current use of insulin ([place]) ?Leslie Davis's blood sugar at home 90's - 100's. Nothing less then 80 or over 110. Stable, with no issues. ? ?3. Other constipation ?Well controlled. She is taking 4 bottles a day. Only taking [name] as needed. Hasn't used much the last 4 weeks. ? ?Assessment/Plan:  ?No orders of the defined types were placed in this encounter. ? ? ?Medications Discontinued During This Encounter  ?Medication Reason  ? chlorthalidone (HYGROTON) 25 MG tablet Patient Preference  ?  ? ?No orders of the defined types were placed in this encounter. ?  ? ?1. Hypertension associated with type 2 diabetes mellitus ([place]) ?Blood pressure essentially at goal today. Continue meds. per PCP and home blood pressure monitor. She is to follow up with cardio if blood pressure meds need a change or not. ? ?2. Type 2 diabetes mellitus with other specified complication, with long-term current use of insulin ([place]) ?Continue medications per PCP. Continue PNP and weight  loss. ? ?3. Other constipation ?Continue proper hydration, high fiber meal plan and increase activity as tolerated. ? ?4. Obesity with current BMI of 28.5 ?Leslie Davis is currently in the action stage of change. As such, her goal is to continue with weight loss efforts. She has agreed to the Category 1 Plan with breakfast options ? ?Exercise goals: As is. Increase activity as tolerated. ? ?Behavioral modification strategies: increasing lean protein intake, decreasing simple carbohydrates, celebration eating strategies, and planning for success. ? ?Leslie Davis has agreed to follow-up with our clinic in 4 weeks. She was informed of the importance of frequent follow-up visits to maximize her success with intensive lifestyle modifications for her multiple health conditions.  ? ?Objective:  ? ?Blood pressure 135/64, pulse (!) 54, temperature 98.1 ?F (36.7 ?C), height '5\' 4"'$  (1.626 m), weight 165 lb (74.8 kg), SpO2 100 %. ?Body mass index is 28.32 kg/m?. ? ?General: Cooperative, alert, well developed, in no acute distress. ?HEENT: Conjunctivae and lids unremarkable. ?Cardiovascular: Regular rhythm.  ?Lungs: Normal work of breathing. ?Neurologic: No focal deficits.  ? ?Lab Results  ?Component Value Date  ? CREATININE 1.16 (H) 06/25/2021  ? BUN 34 (H) 06/25/2021  ? NA 138 06/25/2021  ? K 4.3 06/25/2021  ? CL 107 06/25/2021  ? CO2 25 06/25/2021  ? ?Lab Results  ?Component Value Date  ? ALT 37 06/25/2021  ? AST 47 (H) 06/25/2021  ? ALKPHOS 94 06/25/2021  ? BILITOT 0.3 06/25/2021  ? ?Lab Results  ?Component Value Date  ? HGBA1C 6.2 (H) 05/28/2021  ? HGBA1C 6.1 (H) 02/26/2021  ?  HGBA1C 6.3 (H) 10/22/2020  ? HGBA1C 6.8 (H) 08/25/2020  ? HGBA1C 8.7 (H) 05/28/2020  ? ?No results found for: INSULIN ?Lab Results  ?Component Value Date  ? TSH 1.640 06/17/2021  ? ?Lab Results  ?Component Value Date  ? CHOL 156 11/20/2020  ? HDL 61 11/20/2020  ? [place] 78 11/20/2020  ? TRIG 89 11/20/2020  ? CHOLHDL 2.6 11/20/2020  ? ?Lab Results  ?Component  Value Date  ? VD25OH 82.1 03/31/2021  ? VD25OH 102.0 (H) 11/20/2020  ? VD25OH 95.2 10/22/2020  ? ?Lab Results  ?Component Value Date  ? WBC 4.5 06/25/2021  ? HGB 13.6 06/25/2021  ? HCT 40.9 06/25/2021  ? MCV 87.2 06/25/2021  ? PLT 245 06/25/2021  ? ?Lab Results  ?Component Value Date  ? IRON 68 11/16/2017  ? TIBC 355 11/16/2017  ? FERRITIN 45.2 11/16/2017  ? ? ?Obesity Behavioral Intervention:  ? ?Approximately 15 minutes were spent on the discussion below. ? ?ASK: ?We discussed the diagnosis of obesity with Leslie Davis today and Leslie Davis agreed to give [place] permission to discuss obesity behavioral modification therapy today. ? ?ASSESS: ?Leslie Davis has the diagnosis of obesity and her BMI today is 28.5. Leslie Davis is in the action stage of change.  ? ?ADVISE: ?Leslie Davis was educated on the multiple health risks of obesity as well as the benefit of weight loss to improve her health. She was advised of the need for long term treatment and the importance of lifestyle modifications to improve her current health and to decrease her risk of future health problems. ? ?AGREE: ?Multiple dietary modification options and treatment options were discussed and Leslie Davis agreed to follow the recommendations documented in the above note. ? ?ARRANGE: ?Leslie Davis was educated on the importance of frequent visits to treat obesity as outlined per CMS and USPSTF guidelines and agreed to schedule her next follow up appointment today. ? ?Attestation Statements:  ? ?Reviewed by clinician on day of visit: allergies, medications, problem list, medical history, surgical history, family history, social history, and previous encounter notes. ? ?I, Brendell Tyus, RMA, am acting as transcriptionist for [employer], DO. ? ?I have reviewed the above documentation for accuracy and completeness, and I agree with the above. Marjory Sneddon, D.O. ? ?The [place] was signed into law in 2016 which includes the topic of electronic health records.  This  provides immediate access to information in MyChart.  This includes consultation notes, operative notes, office notes, lab results and pathology reports.  If you have any questions about what you read please let us know at your next visit so we can discuss your concerns and take corrective action if need be.  We are right here with you. ? ?

## 2021-07-29 NOTE — Therapy (Signed)
?OUTPATIENT PHYSICAL THERAPY TREATMENT NOTE ? ? ?Patient Name: Leslie Davis ?MRN: [id number] ?DOB:05-31-1955, 66 y.o., female ?Today's Date: 07/29/2021 ? ?PCP: [name], MD ?REFERRING PROVIDER: Aundra Dubin, PA-C ? ?END OF SESSION:  ? PT End of Session - 07/29/21 0907   ? ? Visit Number 3   ? Number of Visits 20   ? Date for PT Re-Evaluation 09/15/21   ? Authorization Type U[employer] Medicare 20% Coinsurance   ? Progress Note Due on Visit 10   ? PT Start Time [phone]   ? PT Stop Time 0925   ? PT Time Calculation (min) 39 min   ? Activity Tolerance Patient tolerated treatment well   ? Behavior During Therapy [facility] for tasks assessed/performed   ? ?  ?  ? ?  ? ? ? ?Past Medical History:  ?Diagnosis Date  ? DM (diabetes mellitus) ([place])   ? Fatty liver disease, nonalcoholic   ? Gastritis   ? Gastroparesis   ? GERD (gastroesophageal reflux disease)   ? Heartburn   ? Hiatal hernia   ? HLD (hyperlipidemia)   ? HTN (hypertension)   ? Joint pain   ? Renal calculus   ? Rheumatoid arthritis ([place])   ? Vitamin D deficiency   ? ?Past Surgical History:  ?Procedure Laterality Date  ? CHOLECYSTECTOMY    ? COLONOSCOPY    ? TOTAL KNEE ARTHROPLASTY Left 10/27/2020  ? Procedure: LEFT TOTAL KNEE ARTHROPLASTY;  Surgeon: [name], MD;  Location: [place];  Service: Orthopedics;  Laterality: Left;  ? TUBAL LIGATION    ? UPPER GASTROINTESTINAL ENDOSCOPY    ? ?Patient Active Problem List  ? Diagnosis Date Noted  ? Other constipation 05/25/2021  ? Class 2 severe obesity with serious comorbidity and body mass index (BMI) of 35.0 to 35.9 in adult [facility]) 12/01/2020  ? Status post total left knee replacement 10/27/2020  ? Primary osteoarthritis of left knee   ? NAFLD (nonalcoholic fatty liver disease) 08/06/2020  ? Other fatigue 07/23/2020  ? SOBOE (shortness of breath on exertion) 07/23/2020  ? Diabetes mellitus ([place]) 07/23/2020  ? Hypertension associated with type 2 diabetes mellitus ([place]) 07/23/2020  ? Hyperlipidemia associated with type 2  diabetes mellitus ([place]) 07/23/2020  ? Absolute anemia 07/23/2020  ? Vitamin D deficiency 07/23/2020  ? At risk for heart disease 07/23/2020  ? AKI (acute kidney injury) ([place]) 09/07/2019  ? Acute kidney injury ([place]) 09/06/2019  ? Bradycardia 09/06/2019  ? Hyperglycemia 09/06/2019  ? HLD (hyperlipidemia)   ? Hypertensive nephropathy 05/16/2019  ? Erosive gastritis 11/16/2017  ? Fatty liver 11/16/2017  ? Abdominal pain 12/11/2012  ? Early satiety 12/11/2012  ? Hiatal hernia   ? Hx of cholecystectomy 02/05/2011  ? Flu-like symptoms 02/05/2011  ? CONSTIPATION 08/27/2008  ? FATTY LIVER DISEASE 05/07/2008  ? Nausea with vomiting 04/23/2008  ? EPIGASTRIC PAIN 04/23/2008  ? RENAL CALCULUS 04/22/2008  ? Type 2 diabetes mellitus with stage 2 chronic kidney disease, with long-term current use of insulin ([place]) 05/19/2006  ? Type 2 diabetes mellitus with hyperlipidemia ([place]) 05/19/2006  ? HYPERTENSION, BENIGN SYSTEMIC 05/19/2006  ? GASTROESOPHAGEAL REFLUX, NO ESOPHAGITIS 05/19/2006  ? AMENORRHEA 05/19/2006  ? ? ?REFERRING DIAG: [id number] (ICD-10-CM) - Status post total left knee replacement ? ?THERAPY DIAG:  ?Chronic pain of left knee ? ?Muscle weakness (generalized) ? ?Difficulty in walking, not elsewhere classified ? ?Localized edema ? ?PERTINENT HISTORY: DM, HTN ? ?PRECAUTIONS: None ? ?SUBJECTIVE: Pt indicated a little pain today.  Pt  indicated some improvement in stair navigation and tub transfers.  ? ?PAIN:  ?Are you having pain? Yes: NPRS scale: up to 5/10 ?Pain location: Lt knee ?Pain description: aching, tightness ?Aggravating factors: twisting, getting out of chair, walking ?Relieving factors: medication ? ? ?OBJECTIVE: (objective measures completed at initial evaluation unless otherwise dated) ? ? ?PATIENT SURVEYS:  ? 07/07/2021: FOTO intake: 52  predicted: 63  ?            ?PALPATION: ? 07/07/2021 : Mild tenderness to touch anterior knee joint, inferior patella Lt ?  ?LE ROM: ?  ?Active ROM Right ?07/07/2021 Left ?07/07/2021  Left ?07/29/2021  ?Hip flexion       ?Hip extension       ?Hip abduction       ?Hip adduction       ?Hip internal rotation       ?Hip external rotation       ?Knee flexion   108 AROM supine heel slide c pain anterior knee 110 AROM in supine heel slide c pain noted  ?Knee extension   -5 in seated LAQ AROM ?  ?0 PROM in supine heel prop   ?Ankle dorsiflexion       ?Ankle plantarflexion       ?Ankle inversion       ?Ankle eversion       ? (Blank rows = not tested) ?  ?LE MMT: ?  ?MMT Right ?07/07/2021 Left ?07/07/2021 Right ?07/29/2021 Left ?07/29/2021  ?Hip flexion 5/5 5/5    ?Hip extension        ?Hip abduction        ?Hip adduction        ?Hip internal rotation        ?Hip external rotation        ?Knee flexion 5/5 4/5    ?Knee extension 4/5 ?21.5, 18.5 lbs 4/5 ?19.1, 17.3 ?lbs c pain 4+/5 ?33, 32.3 lbs 4/5 ?25.6, 23.3 lbs  ?Ankle dorsiflexion 5/5 5/5    ?Ankle plantarflexion        ?Ankle inversion        ?Ankle eversion        ? (Blank rows = not tested) ?  ?  ?FUNCTIONAL TESTS:  ?07/29/2021:  TUG : independent 10.73 seconds ? ?07/07/2021         ?TUG: independent 15.95 seconds ?            Lt SLS:  3 seconds   Rt SLS: 10 seconds ?            18 inch transfer s UE assist: on second try ?  ?GAIT: ?07/07/2021 ?Distance walked: Household distances within clinic  ?Assistive device utilized: Independent  ?Comments: Mild maintained knee flexion in stance Lt leg, mild reduction of stance on Lt leg.  ?  ?  ?  ?TODAY'S TREATMENT: ?07/29/2021 ?Therex: ?    NuStep L6 x 10 min ?    Sit to stand  18 inch table 10 reps no UE assist  ?Supine bridge 3 sec hold 2 x 10 ?Runner stretch at wall c cues for pushing heel into ground 15 sec x 5 bilateral ?Leg press double leg 62 lbs x 10, single leg 2 x 10 37 lbs, performed bilateral ? ?Neuro Re-ed ? Standing church pew weight shift fwd/back ankle strategy x20 ? Tandem stance 1 min x 1 bilateral ? Retro step x 15 bilateral ?  ? ?Manual ? Supine Lt knee g3 IR/ER proximal tibia mobs,  Distraction c  flexion/IR mobilization c movement Lt knee ? ?07/22/21 ?Therex: ?     Aerobic: ?NuStep L6 x 10 min ?    Sitting: ?Sit to/from stand x 10 reps - push off from UEs to stand ?Seated SLR on Lt 2x10 reps ?Lt quad set x10 reps ?Lt LAQ 2x10; 4# with 5 sec hold ?    Supine: ?Quad set x 10 reps (long sitting and with bil quad set) ?Bridges x 10 reps ?Heelslide x 10 reps on Lt ?Single limb clamshell x 10 reps bil with L4 band ? ? ?07/07/2021 ?Therex: ?           HEP instruction/performance c cues for techniques, handout provided.  Trial set performed of each for comprehension and symptom assessment.  See below for exercise list. ?  ?  ?PATIENT EDUCATION:  ?07/07/2021 ?Education details: HEP, POC ?Person educated: Patient ?Education method: Explanation, Demonstration, Verbal cues, and Handouts ?Education comprehension: verbalized understanding, returned demonstration, and verbal cues required ?  ?  ?HOME EXERCISE PROGRAM: ?07/07/2021 ?Access Code: [email or web address] ?URL: [email or web address] ?Date: 07/07/2021 ?Prepared by: Scot Jun ?  ?Exercises ?- Supine Quadricep Sets  - 3-5 x daily - 7 x weekly - 1 sets - 10 reps - 5 hold ?- Supine Heel Slide (Mirrored)  - 3-5 x daily - 7 x weekly - 1 sets - 10 reps - 2 hold ?- Seated Quad Set (Mirrored)  - 3-5 x daily - 7 x weekly - 1 sets - 10 reps - 5 hold ?- Seated Long Arc Quad (Mirrored)  - 3-5 x daily - 7 x weekly - 1 sets - 5-10 reps - 2 hold ?- Seated Straight Leg Heel Taps  - 1-2 x daily - 7 x weekly - 1-2 sets - 5-10 reps ?- Sit to Stand  - 1-2 x daily - 7 x weekly - 1-2 sets - 10 reps ?  ?ASSESSMENT: ?  ?CLINICAL IMPRESSION: ?Reassessment of strength showed improvement bilateral but room for continued improvement.  Continued skilled PT services indicated to progress in functional capacity.  ?  ?OBJECTIVE IMPAIRMENTS Abnormal gait, decreased activity tolerance, decreased balance, decreased coordination, decreased endurance, decreased mobility, difficulty walking, decreased  ROM, decreased strength, hypomobility, increased edema, impaired perceived functional ability, impaired flexibility, improper body mechanics, and pain.  ?  ?ACTIVITY LIMITATIONS cleaning, community acti

## 2021-08-03 ENCOUNTER — Ambulatory Visit: Payer: Medicare Other | Admitting: Cardiology

## 2021-08-03 ENCOUNTER — Encounter: Payer: Self-pay | Admitting: Cardiology

## 2021-08-03 VITALS — BP 135/70 | HR 59 | Temp 97.8°F | Resp 16 | Ht 64.0 in | Wt 171.0 lb

## 2021-08-03 DIAGNOSIS — I208 Other forms of angina pectoris: Secondary | ICD-10-CM | POA: Diagnosis not present

## 2021-08-03 DIAGNOSIS — I2510 Atherosclerotic heart disease of native coronary artery without angina pectoris: Secondary | ICD-10-CM

## 2021-08-03 DIAGNOSIS — R0609 Other forms of dyspnea: Secondary | ICD-10-CM | POA: Diagnosis not present

## 2021-08-03 DIAGNOSIS — I2584 Coronary atherosclerosis due to calcified coronary lesion: Secondary | ICD-10-CM | POA: Diagnosis not present

## 2021-08-03 DIAGNOSIS — I2089 Other forms of angina pectoris: Secondary | ICD-10-CM

## 2021-08-03 DIAGNOSIS — I10 Essential (primary) hypertension: Secondary | ICD-10-CM | POA: Diagnosis not present

## 2021-08-03 DIAGNOSIS — N1831 Chronic kidney disease, stage 3a: Secondary | ICD-10-CM | POA: Diagnosis not present

## 2021-08-03 DIAGNOSIS — E1122 Type 2 diabetes mellitus with diabetic chronic kidney disease: Secondary | ICD-10-CM | POA: Diagnosis not present

## 2021-08-03 DIAGNOSIS — E1169 Type 2 diabetes mellitus with other specified complication: Secondary | ICD-10-CM | POA: Diagnosis not present

## 2021-08-03 DIAGNOSIS — Z794 Long term (current) use of insulin: Secondary | ICD-10-CM | POA: Diagnosis not present

## 2021-08-03 DIAGNOSIS — I700 Atherosclerosis of aorta: Secondary | ICD-10-CM

## 2021-08-03 MED ORDER — NITROGLYCERIN 0.4 MG SL SUBL: 0.4000 mg | SUBLINGUAL_TABLET | SUBLINGUAL | 0 refills | Status: AC | PRN

## 2021-08-03 MED ORDER — METOPROLOL SUCCINATE ER 25 MG PO TB24: 25.0000 mg | ORAL_TABLET | Freq: Every day | ORAL | 0 refills | Status: DC

## 2021-08-03 NOTE — Progress Notes (Signed)
? ?ID:  Leslie Davis, DOB [birth year], MRN [id number] ? ?PCP:  [name], MD  ?Cardiologist:  Rex Kras, DO, [facility] (established care July 09, 2021) ?Former Cardiology Providers: Dr. Minus Breeding.  ? ?Date: 08/03/21 ?Last Office Visit: 07/09/2021 ? ?Chief Complaint  ?Patient presents with  ? Follow-up  ?  Reevaluation of chest pain and discuss test results  ? ? ?HPI  ?Leslie Davis is a 66 y.o. African-American female whose past medical history and cardiovascular risk factors include: Coronary artery calcification, hypertension, hyperlipidemia, GERD, fatty liver disease, IDDM Type II, nephrolithiasis, advanced age. ? ?Patient was referred to the practice after being evaluated at [facility] long ED for shortness of breath/chest tightness.  Her characteristics of chest discomfort appears to be likely cardiac and given her other risk factors such as insulin-dependent diabetes, CAC, hyperlipidemia and hypertension patient was recommended to undergo an echo and coronary CTA. ? ?She is asked to come in sooner than her scheduled appointment to discuss test results.  Since last office visit patient states that her precordial discomfort is improved with up titration of medical therapy but at times still services.  Results of the coronary CTA discussed with her in great detail including images findings noted below for further reference. ? ?Home blood pressures are well controlled around 656 mmHg and diastolic blood pressures are between 60-70 mmHg. ? ?She continues to have reduced physical endurance compared to the past. ? ?ALLERGIES: ?Allergies  ?Allergen Reactions  ? Fiasp [Insulin Aspart (W-Niacinamide)] Hives  ? ? ?MEDICATION LIST PRIOR TO VISIT: ?Current Meds  ?Medication Sig  ? acetaminophen-codeine (TYLENOL #3) 300-30 MG tablet Take 1 tablet by mouth every 8 (eight) hours as needed for moderate pain.  ? amLODipine (NORVASC) 10 MG tablet TAKE ONE TABLET BY MOUTH ONCE DAILY  ? aspirin EC 81 MG tablet Take 1  tablet (81 mg total) by mouth 2 (two) times daily. To be taken after surgery  ? Azilsartan Medoxomil (EDARBI) 80 MG TABS TAKE ONE TABLET BY MOUTH ONCE DAILY  ? Blood Glucose Monitoring Suppl (ONETOUCH VERIO) w/Device KIT Use as directed to check blood sugars 2 times per day dx: e11.65  ? cetirizine (ZYRTEC) 10 MG tablet Take 1 tablet (10 mg total) by mouth daily.  ? dapagliflozin propanediol (FARXIGA) 10 MG TABS tablet Take 1 tablet (10 mg total) by mouth daily before breakfast.  ? ferrous gluconate (FERGON) 324 MG tablet TAKE 1 TABLET BY MOUTH TWICE DAILY WITH A MEAL. (Patient taking differently: Take 324 mg by mouth 2 (two) times daily with a meal.)  ? glucose blood (ONETOUCH VERIO) test strip Use as instructed to check blood sugars 2 times per day dx:e11.65  ? insulin lispro (HUMALOG KWIKPEN) 100 UNIT/ML KwikPen Sliding scale  ? linaclotide ([name]) 145 MCG CAPS capsule Take 1 capsule (145 mcg total) by mouth daily.  ? metoprolol succinate (TOPROL XL) 25 MG 24 hr tablet Take 1 tablet (25 mg total) by mouth daily.  ? nitroGLYCERIN (NITROSTAT) 0.4 MG SL tablet Place 1 tablet (0.4 mg total) under the tongue every 5 (five) minutes as needed for chest pain. If you require more than two tablets five minutes apart go to the nearest ER via EMS.  ? OneTouch Delica Lancets 81E MISC Use as directed to check blood sugars 2 times per day dx:e11.65  ? pantoprazole (PROTONIX) 40 MG tablet Take one tablet by mouth 3 days a week  ? rosuvastatin (CRESTOR) 20 MG tablet TAKE ONE TABLET BY MOUTH ONCE DAILY  ?  Semaglutide, 2 MG/DOSE, 8 MG/3ML SOPN Inject 2 mg as directed once a week.  ? temazepam (RESTORIL) 30 MG capsule TAKE ONE CAPSULE BY MOUTH EVERYDAY AT BEDTIME AS NEEDED  ? TRUEPLUS PEN NEEDLES 31G X 6 MM MISC USE WITH pen TO INJECT insulin DAILY  ? [DISCONTINUED] metoprolol tartrate (LOPRESSOR) 25 MG tablet Take 1 tablet (25 mg total) by mouth 2 (two) times daily for 7 days. Start two days before your CT Scan.  ?  ? ?PAST  MEDICAL HISTORY: ?Past Medical History:  ?Diagnosis Date  ? DM (diabetes mellitus) ([place])   ? Fatty liver disease, nonalcoholic   ? Gastritis   ? Gastroparesis   ? GERD (gastroesophageal reflux disease)   ? Heartburn   ? Hiatal hernia   ? HLD (hyperlipidemia)   ? HTN (hypertension)   ? Joint pain   ? Renal calculus   ? Rheumatoid arthritis ([place])   ? Vitamin D deficiency   ? ? ?PAST SURGICAL HISTORY: ?Past Surgical History:  ?Procedure Laterality Date  ? CHOLECYSTECTOMY    ? COLONOSCOPY    ? TOTAL KNEE ARTHROPLASTY Left 10/27/2020  ? Procedure: LEFT TOTAL KNEE ARTHROPLASTY;  Surgeon: [name], MD;  Location: [place];  Service: Orthopedics;  Laterality: Left;  ? TUBAL LIGATION    ? UPPER GASTROINTESTINAL ENDOSCOPY    ? ? ?FAMILY HISTORY: ?The patient family history includes COPD in her sister; Diabetes in her brother, maternal grandmother, mother, and sister; Heart disease in her sister; Heart disease (age of onset: 67) in her brother; Hyperlipidemia in her father; Hypertension in her father; Kidney disease in her mother and sister; Stomach cancer in her maternal aunt; Stroke in her father; Sudden death in her father; Thyroid disease in her mother. ? ?SOCIAL HISTORY:  ?The patient  reports that she quit smoking about 46 years ago. Her smoking use included cigarettes. She has a 0.25 pack-year smoking history. She has never used smokeless tobacco. She reports that she does not drink alcohol and does not use drugs. ? ?REVIEW OF SYSTEMS: ?Review of Systems  ?Constitutional: Positive for malaise/fatigue.  ?Cardiovascular:  Positive for chest pain (tightness) and dyspnea on exertion. Negative for leg swelling, near-syncope, palpitations, paroxysmal nocturnal dyspnea and syncope.  ?Respiratory:  Positive for shortness of breath.   ? ?PHYSICAL EXAM: ? ?  08/03/2021  ?  3:09 PM 07/22/2021  ?  4:20 PM 07/22/2021  ?  3:39 PM  ?Vitals with BMI  ?Height $Remove'5\' 4"'[id number]$     ?Weight 171 lbs    ?BMI 29.34    ?Systolic 366 294 765  ?Diastolic 70 63  74  ?Pulse 59 57 46  ? ? ?CONSTITUTIONAL: Well-developed and well-nourished. No acute distress.  ?SKIN: Skin is warm and dry. No rash noted. No cyanosis. No pallor. No jaundice ?HEAD: Normocephalic and atraumatic.  ?EYES: No scleral icterus ?MOUTH/THROAT: Moist oral membranes.  ?NECK: No JVD present. No thyromegaly noted. No carotid bruits  ?LYMPHATIC: No visible cervical adenopathy.  ?CHEST Normal respiratory effort. No intercostal retractions  ?LUNGS: Clear to auscultation bilaterally.  No stridor. No wheezes. No rales.  ?CARDIOVASCULAR: Regular rate and rhythm, positive S1-S2, no murmurs rubs or gallops appreciated. ?ABDOMINAL: Obese, soft, nontender, nondistended, positive bowel sounds all 4 quadrants. No apparent ascites.  ?EXTREMITIES: No peripheral edema, warm to touch, +2 bilateral DP and PT pulses ?HEMATOLOGIC: No significant bruising ?NEUROLOGIC: Oriented to person, place, and time. Nonfocal. Normal muscle tone.  ?PSYCHIATRIC: Normal mood and affect. Normal behavior. Cooperative ? ?RADIOLOGY ?CTA  PE protocol: ?June 25, 2021 ?1. No pulmonary embolus or acute intrathoracic abnormality. ?2. Coronary artery calcifications. ?Aortic Atherosclerosis (ICD10-I70.0) ? ?CARDIAC DATABASE: ?EKG: ?07/09/2021: Normal sinus rhythm, 60 bpm, old anteroseptal infarct, TWI in lateral leads consider possible ischemia.  ? ?Echocardiogram: ?07/15/2021:  ?Normal LV systolic function with EF 63%. Left ventricle cavity is normal  ?in size. Mild concentric remodeling of the left ventricle. Normal global  ?wall motion. Normal diastolic filling pattern. Calculated EF 63%.  ?Left atrial cavity is mildly dilated by volume.  ?Structurally normal tricuspid valve.  Mild tricuspid regurgitation. No  ?evidence of pulmonary hypertension. ?  ?Stress Testing: ?11/03/2018 [facility] available in Care Everywhere: ?Per report: No reversible ischemia or infarction, normal wall motion, calculated LVEF 84%, low  risk study ? ?CCTA: ?07/22/2021 ?1. Total coronary calcium score of 341. This was 93rd percentile for age and sex matched control. ?2. Normal coronary origin with co-dominance. ?3. CAD-RADS = 3. ? ?Left Main: Pa

## 2021-08-04 ENCOUNTER — Ambulatory Visit (INDEPENDENT_AMBULATORY_CARE_PROVIDER_SITE_OTHER): Payer: Medicare Other | Admitting: Orthopaedic Surgery

## 2021-08-04 ENCOUNTER — Encounter: Payer: Self-pay | Admitting: Orthopaedic Surgery

## 2021-08-04 ENCOUNTER — Ambulatory Visit (INDEPENDENT_AMBULATORY_CARE_PROVIDER_SITE_OTHER): Payer: Medicare Other

## 2021-08-04 DIAGNOSIS — Z96652 Presence of left artificial knee joint: Secondary | ICD-10-CM

## 2021-08-04 MED ORDER — ACETAMINOPHEN-CODEINE #3 300-30 MG PO TABS: 1.0000 | ORAL_TABLET | Freq: Every day | ORAL | 0 refills | Status: DC | PRN

## 2021-08-04 NOTE — Progress Notes (Signed)
? ?Office Visit Note ?  ?Patient: Leslie Davis           ?Date of Birth: [birth year]           ?MRN: [id number] ?Visit Date: 08/04/2021 ?             ?Requested by: [name], MD ?[address] ?[address] ?[place],  [place] [address] ?PCP: [name], MD ? ? ?Assessment & Plan: ?Visit Diagnoses:  ?1. Status post total left knee replacement   ? ? ?Plan: Overall I think that Leslie Davis is doing well.  I think her issues are related to lack of strength and conditioning.  I have urged her to continue to do physical therapy for this.  The implant looks stable.  I did refill the Tylenol 3 for breakthrough pain.  Dental prophylaxis reinforced.  Recheck in a year with two-view x-rays of the left knee. ? ?Follow-Up Instructions: Return in about 1 year (around 08/05/2022).  ? ?Orders:  ?Orders Placed This Encounter  ?Procedures  ? XR KNEE 3 VIEW LEFT  ? ?Meds ordered this encounter  ?Medications  ? acetaminophen-codeine (TYLENOL #3) 300-30 MG tablet  ?  Sig: Take 1 tablet by mouth daily as needed for moderate pain.  ?  Dispense:  30 tablet  ?  Refill:  0  ? ? ? ? Procedures: ?No procedures performed ? ? ?Clinical Data: ?No additional findings. ? ? ?Subjective: ?Chief Complaint  ?Patient presents with  ? Left Knee - Follow-up  ?  S/p left total knee arthroplasty 10/27/2020  ? ? ?HPI ? ?[name] is 9 months status post left total knee replacement on 10/27/2020.  Doing well overall and has been doing physical therapy once a week.  Takes Tylenol 3 for breakthrough pain.  She is functional with ADLs.  The knee feels stable overall. ? ?Review of Systems ? ? ?Objective: ?Vital Signs: There were no vitals taken for this visit. ? ?Physical Exam ? ?Ortho Exam ? ?Examination of the left knee shows fully healed surgical scar.  Excellent functional range of motion.  Stable to varus valgus.  Slight crepitus with range of motion.  No joint effusion.  No evidence of infection. ? ?Specialty Comments:  ?No specialty comments  available. ? ?Imaging: ?XR KNEE 3 VIEW LEFT ? ?Result Date: 08/04/2021 ?Stable total knee replacement without complication  ? ? ?PMFS History: ?Patient Active Problem List  ? Diagnosis Date Noted  ? Other constipation 05/25/2021  ? Class 2 severe obesity with serious comorbidity and body mass index (BMI) of 35.0 to 35.9 in adult Leslie Davis) 12/01/2020  ? Status post total left knee replacement 10/27/2020  ? Primary osteoarthritis of left knee   ? NAFLD (nonalcoholic fatty liver disease) 08/06/2020  ? Other fatigue 07/23/2020  ? SOBOE (shortness of breath on exertion) 07/23/2020  ? Diabetes mellitus (Leslie Davis) 07/23/2020  ? Hypertension associated with type 2 diabetes mellitus (Leslie Davis) 07/23/2020  ? Hyperlipidemia associated with type 2 diabetes mellitus (Leslie Davis) 07/23/2020  ? Absolute anemia 07/23/2020  ? Vitamin D deficiency 07/23/2020  ? At risk for heart disease 07/23/2020  ? AKI (acute kidney injury) (Leslie Davis) 09/07/2019  ? Acute kidney injury (Leslie Davis) 09/06/2019  ? Bradycardia 09/06/2019  ? Hyperglycemia 09/06/2019  ? HLD (hyperlipidemia)   ? Hypertensive nephropathy 05/16/2019  ? Erosive gastritis 11/16/2017  ? Fatty liver 11/16/2017  ? Abdominal pain 12/11/2012  ? Early satiety 12/11/2012  ? Hiatal hernia   ? Hx of cholecystectomy 02/05/2011  ? Flu-like symptoms 02/05/2011  ?  CONSTIPATION 08/27/2008  ? FATTY LIVER DISEASE 05/07/2008  ? Nausea with vomiting 04/23/2008  ? EPIGASTRIC PAIN 04/23/2008  ? RENAL CALCULUS 04/22/2008  ? Type 2 diabetes mellitus with stage 2 chronic kidney disease, with long-term current use of insulin (Leslie Davis) 05/19/2006  ? Type 2 diabetes mellitus with hyperlipidemia (Leslie Davis) 05/19/2006  ? HYPERTENSION, BENIGN SYSTEMIC 05/19/2006  ? GASTROESOPHAGEAL REFLUX, NO ESOPHAGITIS 05/19/2006  ? AMENORRHEA 05/19/2006  ? ?Past Medical History:  ?Diagnosis Date  ? DM (diabetes mellitus) (Leslie Davis)   ? Fatty liver disease, nonalcoholic   ? Gastritis   ? Gastroparesis   ? GERD (gastroesophageal reflux disease)   ? Heartburn   ?  Hiatal hernia   ? HLD (hyperlipidemia)   ? HTN (hypertension)   ? Joint pain   ? Renal calculus   ? Rheumatoid arthritis (Leslie Davis)   ? Vitamin D deficiency   ?  ?Family History  ?Problem Relation Age of Onset  ? Diabetes Mother   ? Thyroid disease Mother   ? Kidney disease Mother   ? Hypertension Father   ? Hyperlipidemia Father   ? Sudden death Father   ? Stroke Father   ? Diabetes Sister   ? Heart disease Sister   ? Kidney disease Sister   ? COPD Sister   ? Heart disease Brother 1  ? Diabetes Brother   ? Stomach cancer Maternal Aunt   ?     X 2 aunts  ? Diabetes Maternal Grandmother   ? Colon cancer Neg Hx   ? Esophageal cancer Neg Hx   ? Rectal cancer Neg Hx   ?  ?Past Surgical History:  ?Procedure Laterality Date  ? CHOLECYSTECTOMY    ? COLONOSCOPY    ? TOTAL KNEE ARTHROPLASTY Left 10/27/2020  ? Procedure: LEFT TOTAL KNEE ARTHROPLASTY;  Surgeon: Leslie Koyanagi, MD;  Location: [facility];  Service: Orthopedics;  Laterality: Left;  ? TUBAL LIGATION    ? UPPER GASTROINTESTINAL ENDOSCOPY    ? ?Social History  ? ?Occupational History  ? Occupation: Housekeeping  ?  Comment: [employer]  ?Tobacco Use  ? Smoking status: Former  ?  Packs/day: 0.25  ?  Years: 1.00  ?  Pack years: 0.25  ?  Types: Cigarettes  ?  Quit date: 09/10/1974  ?  Years since quitting: 46.9  ? Smokeless tobacco: Never  ? Tobacco comments:  ?  she no longer smokes.   ?Vaping Use  ? Vaping Use: Never used  ?Substance and Sexual Activity  ? Alcohol use: No  ? Drug use: No  ? Sexual activity: Not Currently  ? ? ? ? ? ? ?

## 2021-08-05 ENCOUNTER — Ambulatory Visit (INDEPENDENT_AMBULATORY_CARE_PROVIDER_SITE_OTHER): Payer: Medicare Other | Admitting: Rehabilitative and Restorative Service Providers"

## 2021-08-05 ENCOUNTER — Encounter: Payer: Self-pay | Admitting: Rehabilitative and Restorative Service Providers"

## 2021-08-05 DIAGNOSIS — R262 Difficulty in walking, not elsewhere classified: Secondary | ICD-10-CM | POA: Diagnosis not present

## 2021-08-05 DIAGNOSIS — R600 Localized edema: Secondary | ICD-10-CM

## 2021-08-05 DIAGNOSIS — M25562 Pain in left knee: Secondary | ICD-10-CM

## 2021-08-05 DIAGNOSIS — M6281 Muscle weakness (generalized): Secondary | ICD-10-CM | POA: Diagnosis not present

## 2021-08-05 DIAGNOSIS — G8929 Other chronic pain: Secondary | ICD-10-CM | POA: Diagnosis not present

## 2021-08-05 NOTE — Therapy (Signed)
?OUTPATIENT PHYSICAL THERAPY TREATMENT NOTE ? ? ?Patient Name: Leslie Davis ?MRN: [id number] ?DOB:Aug 13, 1955, 66 y.o., female ?Today's Date: 08/05/2021 ? ?PCP: [name], MD ?REFERRING PROVIDER: Aundra Dubin, PA-C ? ?END OF SESSION:  ? PT End of Session - 08/05/21 0856   ? ? Visit Number 4   ? Number of Visits 20   ? Date for PT Re-Evaluation 09/15/21   ? Authorization Type [employer] Medicare 20% Coinsurance   ? Progress Note Due on Visit 10   ? PT Start Time [phone]   ? PT Stop Time [phone]   ? PT Time Calculation (min) 39 min   ? Activity Tolerance Patient tolerated treatment well   ? Behavior During Therapy [facility] for tasks assessed/performed   ? ?  ?  ? ?  ? ? ? ? ?Past Medical History:  ?Diagnosis Date  ? DM (diabetes mellitus) ([place])   ? Fatty liver disease, nonalcoholic   ? Gastritis   ? Gastroparesis   ? GERD (gastroesophageal reflux disease)   ? Heartburn   ? Hiatal hernia   ? HLD (hyperlipidemia)   ? HTN (hypertension)   ? Joint pain   ? Renal calculus   ? Rheumatoid arthritis ([place])   ? Vitamin D deficiency   ? ?Past Surgical History:  ?Procedure Laterality Date  ? CHOLECYSTECTOMY    ? COLONOSCOPY    ? TOTAL KNEE ARTHROPLASTY Left 10/27/2020  ? Procedure: LEFT TOTAL KNEE ARTHROPLASTY;  Surgeon: [name], MD;  Location: [facility];  Service: Orthopedics;  Laterality: Left;  ? TUBAL LIGATION    ? UPPER GASTROINTESTINAL ENDOSCOPY    ? ?Patient Active Problem List  ? Diagnosis Date Noted  ? Other constipation 05/25/2021  ? Class 2 severe obesity with serious comorbidity and body mass index (BMI) of 35.0 to 35.9 in adult [facility]) 12/01/2020  ? Status post total left knee replacement 10/27/2020  ? Primary osteoarthritis of left knee   ? NAFLD (nonalcoholic fatty liver disease) 08/06/2020  ? Other fatigue 07/23/2020  ? SOBOE (shortness of breath on exertion) 07/23/2020  ? Diabetes mellitus ([place]) 07/23/2020  ? Hypertension associated with type 2 diabetes mellitus ([place]) 07/23/2020  ? Hyperlipidemia associated with type 2  diabetes mellitus ([place]) 07/23/2020  ? Absolute anemia 07/23/2020  ? Vitamin D deficiency 07/23/2020  ? At risk for heart disease 07/23/2020  ? AKI (acute kidney injury) ([place]) 09/07/2019  ? Acute kidney injury ([place]) 09/06/2019  ? Bradycardia 09/06/2019  ? Hyperglycemia 09/06/2019  ? HLD (hyperlipidemia)   ? Hypertensive nephropathy 05/16/2019  ? Erosive gastritis 11/16/2017  ? Fatty liver 11/16/2017  ? Abdominal pain 12/11/2012  ? Early satiety 12/11/2012  ? Hiatal hernia   ? Hx of cholecystectomy 02/05/2011  ? Flu-like symptoms 02/05/2011  ? CONSTIPATION 08/27/2008  ? FATTY LIVER DISEASE 05/07/2008  ? Nausea with vomiting 04/23/2008  ? EPIGASTRIC PAIN 04/23/2008  ? RENAL CALCULUS 04/22/2008  ? Type 2 diabetes mellitus with stage 2 chronic kidney disease, with long-term current use of insulin ([place]) 05/19/2006  ? Type 2 diabetes mellitus with hyperlipidemia ([place]) 05/19/2006  ? HYPERTENSION, BENIGN SYSTEMIC 05/19/2006  ? GASTROESOPHAGEAL REFLUX, NO ESOPHAGITIS 05/19/2006  ? AMENORRHEA 05/19/2006  ? ? ?REFERRING DIAG: [id number] (ICD-10-CM) - Status post total left knee replacement ? ?THERAPY DIAG:  ?Chronic pain of left knee ? ?Muscle weakness (generalized) ? ?Difficulty in walking, not elsewhere classified ? ?Localized edema ? ?PERTINENT HISTORY: DM, HTN ? ?PRECAUTIONS: None ? ?SUBJECTIVE: Pt indicated doing well today.  Pt  indicated prolonged standing/walking can be aggravating at times.  ? ?PAIN:  ?NPRS scale: up to 3/10 since last visit ?Pain location: Lt knee ?Pain description: aching, tightness ?Aggravating factors: twisting, getting out of chair, walking ?Relieving factors: medication ? ? ?OBJECTIVE: (objective measures completed at initial evaluation unless otherwise dated) ? ? ?PATIENT SURVEYS:  ?08/05/2021:   update 65% ? ?07/07/2021: FOTO intake: 52  predicted: 63  ?            ?PALPATION: ? 07/07/2021 : Mild tenderness to touch anterior knee joint, inferior patella Lt ?  ?LE ROM: ?  ?Active ROM Right ?07/07/2021  Left ?07/07/2021 Left ?07/29/2021  ?Hip flexion       ?Hip extension       ?Hip abduction       ?Hip adduction       ?Hip internal rotation       ?Hip external rotation       ?Knee flexion   108 AROM supine heel slide c pain anterior knee 110 AROM in supine heel slide c pain noted  ?Knee extension   -5 in seated LAQ AROM ?  ?0 PROM in supine heel prop   ?Ankle dorsiflexion       ?Ankle plantarflexion       ?Ankle inversion       ?Ankle eversion       ? (Blank rows = not tested) ?  ?LE MMT: ?  ?MMT Right ?07/07/2021 Left ?07/07/2021 Right ?07/29/2021 Left ?07/29/2021  ?Hip flexion 5/5 5/5    ?Hip extension        ?Hip abduction        ?Hip adduction        ?Hip internal rotation        ?Hip external rotation        ?Knee flexion 5/5 4/5    ?Knee extension 4/5 ?21.5, 18.5 lbs 4/5 ?19.1, 17.3 ?lbs c pain 4+/5 ?33, 32.3 lbs 4/5 ?25.6, 23.3 lbs  ?Ankle dorsiflexion 5/5 5/5    ?Ankle plantarflexion        ?Ankle inversion        ?Ankle eversion        ? (Blank rows = not tested) ?  ?  ?FUNCTIONAL TESTS:  ?07/29/2021:  TUG : independent 10.73 seconds ? ?07/07/2021         ?TUG: independent 15.95 seconds ?            Lt SLS:  3 seconds   Rt SLS: 10 seconds ?            18 inch transfer s UE assist: on second try ?  ?GAIT: ?07/07/2021 ?Distance walked: Household distances within clinic  ?Assistive device utilized: Independent  ?Comments: Mild maintained knee flexion in stance Lt leg, mild reduction of stance on Lt leg.  ?  ?  ?  ?TODAY'S TREATMENT: ?08/05/2021 ?Therex: ?    Recumbent bike lvl 2 6 mins, Seat 4 ?    Incline board gastroc stretch bilateral 1 min x 3 ?Step up fwd 4 inch 2 x 10 Lt leg (6 inch was too high - compensatory movements noted) ?Standing blue band TKE hold 5 sec x 15 ?Leg press double leg 62 lbs 2 x 15, single leg x15 43 lbs very slow rep speed, performed bilateral ?Seated SLR 2 x 10 bilateral ? ?Neuro Re-ed ? Standing church pew weight shift fwd/back ankle strategy 3 mins on foam ? Tandem stance 1 min x 1  bilateral on  foam  ?  ?  ? ? ?07/29/2021 ?Therex: ?    NuStep L6 x 10 min ?    Sit to stand  18 inch table 10 reps no UE assist  ?Supine bridge 3 sec hold 2 x 10 ?Runner stretch at wall c cues for pushing heel into ground 15 sec x 5 bilateral ?Leg press double leg 62 lbs x 10, single leg 2 x 10 37 lbs, performed bilateral ? ?Neuro Re-ed ? Standing church pew weight shift fwd/back ankle strategy x20 ? Tandem stance 1 min x 1 bilateral ? Retro step x 15 bilateral ?  ? ?Manual ? Supine Lt knee g3 IR/ER proximal tibia mobs, Distraction c flexion/IR mobilization c movement Lt knee ? ?07/22/21 ?Therex: ?     Aerobic: ?NuStep L6 x 10 min ?    Sitting: ?Sit to/from stand x 10 reps - push off from UEs to stand ?Seated SLR on Lt 2x10 reps ?Lt quad set x10 reps ?Lt LAQ 2x10; 4# with 5 sec hold ?    Supine: ?Quad set x 10 reps (long sitting and with bil quad set) ?Bridges x 10 reps ?Heelslide x 10 reps on Lt ?Single limb clamshell x 10 reps bil with L4 band ? ? ?PATIENT EDUCATION:  ?07/07/2021 ?Education details: HEP, POC ?Person educated: Patient ?Education method: Explanation, Demonstration, Verbal cues, and Handouts ?Education comprehension: verbalized understanding, returned demonstration, and verbal cues required ?  ?  ?HOME EXERCISE PROGRAM: ?07/07/2021 ?Access Code: [email or web address] ?URL: [email or web address] ?Date: 07/07/2021 ?Prepared by: Scot Jun ?  ?Exercises ?- Supine Quadricep Sets  - 3-5 x daily - 7 x weekly - 1 sets - 10 reps - 5 hold ?- Supine Heel Slide (Mirrored)  - 3-5 x daily - 7 x weekly - 1 sets - 10 reps - 2 hold ?- Seated Quad Set (Mirrored)  - 3-5 x daily - 7 x weekly - 1 sets - 10 reps - 5 hold ?- Seated Long Arc Quad (Mirrored)  - 3-5 x daily - 7 x weekly - 1 sets - 5-10 reps - 2 hold ?- Seated Straight Leg Heel Taps  - 1-2 x daily - 7 x weekly - 1-2 sets - 5-10 reps ?- Sit to Stand  - 1-2 x daily - 7 x weekly - 1-2 sets - 10 reps ?  ?ASSESSMENT: ?  ?CLINICAL IMPRESSION: ?Noted improvement in  FOTO reassessment today (showing past predicted value).  Continued strengthening intervention to benefit Pt tolerance to WB activity.   ?  ?OBJECTIVE IMPAIRMENTS Abnormal gait, decreased activity tolerance, dec

## 2021-08-06 ENCOUNTER — Ambulatory Visit (INDEPENDENT_AMBULATORY_CARE_PROVIDER_SITE_OTHER): Payer: Medicare Other | Admitting: Family Medicine

## 2021-08-07 ENCOUNTER — Ambulatory Visit: Payer: Medicare Other | Admitting: Student

## 2021-08-07 ENCOUNTER — Ambulatory Visit: Payer: Medicare Other | Admitting: Cardiology

## 2021-08-10 ENCOUNTER — Ambulatory Visit (INDEPENDENT_AMBULATORY_CARE_PROVIDER_SITE_OTHER): Payer: Medicare Other | Admitting: Family Medicine

## 2021-08-10 ENCOUNTER — Encounter (INDEPENDENT_AMBULATORY_CARE_PROVIDER_SITE_OTHER): Payer: Self-pay | Admitting: Family Medicine

## 2021-08-10 VITALS — BP 147/73 | HR 54 | Temp 97.6°F | Ht 64.0 in | Wt 165.0 lb

## 2021-08-10 DIAGNOSIS — E1169 Type 2 diabetes mellitus with other specified complication: Secondary | ICD-10-CM

## 2021-08-10 DIAGNOSIS — Z6828 Body mass index (BMI) 28.0-28.9, adult: Secondary | ICD-10-CM | POA: Diagnosis not present

## 2021-08-10 DIAGNOSIS — I25119 Atherosclerotic heart disease of native coronary artery with unspecified angina pectoris: Secondary | ICD-10-CM

## 2021-08-10 DIAGNOSIS — E669 Obesity, unspecified: Secondary | ICD-10-CM | POA: Diagnosis not present

## 2021-08-10 DIAGNOSIS — E66812 Obesity, class 2: Secondary | ICD-10-CM

## 2021-08-10 DIAGNOSIS — Z794 Long term (current) use of insulin: Secondary | ICD-10-CM

## 2021-08-10 DIAGNOSIS — E559 Vitamin D deficiency, unspecified: Secondary | ICD-10-CM

## 2021-08-10 DIAGNOSIS — Z7985 Long-term (current) use of injectable non-insulin antidiabetic drugs: Secondary | ICD-10-CM | POA: Diagnosis not present

## 2021-08-11 DIAGNOSIS — I208 Other forms of angina pectoris: Secondary | ICD-10-CM | POA: Diagnosis not present

## 2021-08-11 DIAGNOSIS — N1831 Chronic kidney disease, stage 3a: Secondary | ICD-10-CM | POA: Diagnosis not present

## 2021-08-12 ENCOUNTER — Encounter: Payer: Self-pay | Admitting: Rehabilitative and Restorative Service Providers"

## 2021-08-12 ENCOUNTER — Ambulatory Visit (INDEPENDENT_AMBULATORY_CARE_PROVIDER_SITE_OTHER): Payer: Medicare Other | Admitting: Rehabilitative and Restorative Service Providers"

## 2021-08-12 DIAGNOSIS — R600 Localized edema: Secondary | ICD-10-CM | POA: Diagnosis not present

## 2021-08-12 DIAGNOSIS — G8929 Other chronic pain: Secondary | ICD-10-CM

## 2021-08-12 DIAGNOSIS — M25562 Pain in left knee: Secondary | ICD-10-CM

## 2021-08-12 DIAGNOSIS — M6281 Muscle weakness (generalized): Secondary | ICD-10-CM | POA: Diagnosis not present

## 2021-08-12 DIAGNOSIS — R262 Difficulty in walking, not elsewhere classified: Secondary | ICD-10-CM | POA: Diagnosis not present

## 2021-08-12 LAB — CBC
Hematocrit: 40.8 % (ref 34.0–46.6)
Hemoglobin: 13.5 g/dL (ref 11.1–15.9)
MCH: 29.2 pg (ref 26.6–33.0)
MCHC: 33.1 g/dL (ref 31.5–35.7)
MCV: 88 fL (ref 79–97)
Platelets: 261 10*3/uL (ref 150–450)
RBC: 4.62 x10E6/uL (ref 3.77–5.28)
RDW: 13.7 % (ref 11.7–15.4)
WBC: 3.4 10*3/uL (ref 3.4–10.8)

## 2021-08-12 LAB — BASIC METABOLIC PANEL
BUN/Creatinine Ratio: 21 (ref 12–28)
BUN: 24 mg/dL (ref 8–27)
CO2: 22 mmol/L (ref 20–29)
Calcium: 9.9 mg/dL (ref 8.7–10.3)
Chloride: 102 mmol/L (ref 96–106)
Creatinine, Ser: 1.16 mg/dL — ABNORMAL HIGH (ref 0.57–1.00)
Glucose: 101 mg/dL — ABNORMAL HIGH (ref 70–99)
Potassium: 4 mmol/L (ref 3.5–5.2)
Sodium: 139 mmol/L (ref 134–144)
eGFR: 52 mL/min/{1.73_m2} — ABNORMAL LOW (ref >59)

## 2021-08-12 NOTE — Therapy (Addendum)
OUTPATIENT PHYSICAL THERAPY TREATMENT NOTE   Patient Name: Leslie Davis MRN: [id number] DOB:April 30, 1955, 66 y.o., female Today's Date: 08/12/2021  PCP: [name], MD REFERRING PROVIDER: Aundra Dubin, PA-C  END OF SESSION:   PT End of Session - 08/12/21 0836     Visit Number 5    Number of Visits 20    Date for PT Re-Evaluation 09/15/21    Authorization Type [employer] Medicare 20% Coinsurance    Progress Note Due on Visit 10    PT Start Time 0840    PT Stop Time 0918    PT Time Calculation (min) 38 min    Activity Tolerance Patient tolerated treatment well    Behavior During Therapy WFL for tasks assessed/performed                Past Medical History:  Diagnosis Date   DM (diabetes mellitus) ([place])    Fatty liver disease, nonalcoholic    Gastritis    Gastroparesis    GERD (gastroesophageal reflux disease)    Heartburn    Hiatal hernia    HLD (hyperlipidemia)    HTN (hypertension)    Joint pain    Renal calculus    Rheumatoid arthritis ([place])    Vitamin D deficiency    Past Surgical History:  Procedure Laterality Date   CHOLECYSTECTOMY     COLONOSCOPY     TOTAL KNEE ARTHROPLASTY Left 10/27/2020   Procedure: LEFT TOTAL KNEE ARTHROPLASTY;  Surgeon: [name], MD;  Location: [facility];  Service: Orthopedics;  Laterality: Left;   TUBAL LIGATION     UPPER GASTROINTESTINAL ENDOSCOPY     Patient Active Problem List   Diagnosis Date Noted   Other constipation 05/25/2021   Class 2 severe obesity with serious comorbidity and body mass index (BMI) of 35.0 to 35.9 in adult ([place]) 12/01/2020   Status post total left knee replacement 10/27/2020   Primary osteoarthritis of left knee    NAFLD (nonalcoholic fatty liver disease) 08/06/2020   Other fatigue 07/23/2020   SOBOE (shortness of breath on exertion) 07/23/2020   Diabetes mellitus ([place]) 07/23/2020   Hypertension associated with type 2 diabetes mellitus ([place]) 07/23/2020   Hyperlipidemia associated with type 2  diabetes mellitus ([place]) 07/23/2020   Absolute anemia 07/23/2020   Vitamin D deficiency 07/23/2020   At risk for heart disease 07/23/2020   AKI (acute kidney injury) ([place]) 09/07/2019   Acute kidney injury ([place]) 09/06/2019   Bradycardia 09/06/2019   Hyperglycemia 09/06/2019   HLD (hyperlipidemia)    Hypertensive nephropathy 05/16/2019   Erosive gastritis 11/16/2017   Fatty liver 11/16/2017   Abdominal pain 12/11/2012   Early satiety 12/11/2012   Hiatal hernia    Hx of cholecystectomy 02/05/2011   Flu-like symptoms 02/05/2011   CONSTIPATION 08/27/2008   FATTY LIVER DISEASE 05/07/2008   Nausea with vomiting 04/23/2008   EPIGASTRIC PAIN 04/23/2008   RENAL CALCULUS 04/22/2008   Type 2 diabetes mellitus with stage 2 chronic kidney disease, with long-term current use of insulin ([place]) 05/19/2006   Type 2 diabetes mellitus with hyperlipidemia ([place]) 05/19/2006   HYPERTENSION, BENIGN SYSTEMIC 05/19/2006   GASTROESOPHAGEAL REFLUX, NO ESOPHAGITIS 05/19/2006   AMENORRHEA 05/19/2006    REFERRING DIAG: P53.614 (ICD-10-CM) - Status post total left knee replacement  THERAPY DIAG:  Chronic pain of left knee  Muscle weakness (generalized)  Difficulty in walking, not elsewhere classified  Localized edema  PERTINENT HISTORY: DM, HTN  PRECAUTIONS: None  SUBJECTIVE: Pt indicated her knee can give  way at times in walking.  Pt indicated having some pain after gym  PAIN:  NPRS scale: up to 6/10 day after gym visit (lasts about 1 day) Pain location: Lt knee Pain description: aching, tightness Aggravating factors: days after gym Relieving factors: tylenol   OBJECTIVE: (objective measures completed at initial evaluation unless otherwise dated)   PATIENT SURVEYS:  08/05/2021:   update 65%  07/07/2021: FOTO intake: 52  predicted: 63              PALPATION:  07/07/2021 : Mild tenderness to touch anterior knee joint, inferior patella Lt   LE ROM:   Active ROM Right 07/07/2021  Left 07/07/2021 Left 07/29/2021  Hip flexion       Hip extension       Hip abduction       Hip adduction       Hip internal rotation       Hip external rotation       Knee flexion   108 AROM supine heel slide c pain anterior knee 110 AROM in supine heel slide c pain noted  Knee extension   -5 in seated LAQ AROM   0 PROM in supine heel prop   Ankle dorsiflexion       Ankle plantarflexion       Ankle inversion       Ankle eversion        (Blank rows = not tested)   LE MMT:   MMT Right 07/07/2021 Left 07/07/2021 Right 07/29/2021 Left 07/29/2021  Hip flexion 5/5 5/5    Hip extension        Hip abduction        Hip adduction        Hip internal rotation        Hip external rotation        Knee flexion 5/5 4/5    Knee extension 4/5 21.5, 18.5 lbs 4/5 19.1, 17.3 lbs c pain 4+/5 33, 32.3 lbs 4/5 25.6, 23.3 lbs  Ankle dorsiflexion 5/5 5/5    Ankle plantarflexion        Ankle inversion        Ankle eversion         (Blank rows = not tested)     FUNCTIONAL TESTS:  08/12/2021:  Lt SLS: 7 seconds Rt SLS: 8 seconds  07/29/2021:  TUG : independent 10.73 seconds  07/07/2021         TUG: independent 15.95 seconds             Lt SLS:  3 seconds   Rt SLS: 10 seconds             18 inch transfer s UE assist: on second try   GAIT: 07/07/2021 Distance walked: Household distances within clinic  Assistive device utilized: Independent  Comments: Mild maintained knee flexion in stance Lt leg, mild reduction of stance on Lt leg.        TODAY'S TREATMENT: 08/12/2021 Therex:     Recumbent bike lvl 3 8 mins, Seat 5     Machine LAQ double leg up, Lt leg eccentric lowering 5 lbs 2 x 10 (3rd slot from back) Machine hamstring curl Lt leg only 2 x 10 5 lbs   TherActivity (performed to improve functional movements including steps)  Fwd step up 6 inch on Lt leg c blue band TKE 2 x 10   Lateral step down 4 inch x 8 slow eccentric lowering (mild symptoms noted)  Leg  press double leg 62 lbs 2  x 15, single leg x15 43 lbs very slow rep speed, performed bilateral  08/05/2021 Therex:     Recumbent bike lvl 2 6 mins, Seat 4     Incline board gastroc stretch bilateral 1 min x 3 Step up fwd 4 inch 2 x 10 Lt leg (6 inch was too high - compensatory movements noted) Standing blue band TKE hold 5 sec x 15 Leg press double leg 62 lbs 2 x 15, single leg x15 43 lbs very slow rep speed, performed bilateral Seated SLR 2 x 10 bilateral  Neuro Re-ed  Standing church pew weight shift fwd/back ankle strategy 3 mins on foam  Tandem stance 1 min x 1 bilateral on foam     07/29/2021 Therex:     NuStep L6 x 10 min     Sit to stand  18 inch table 10 reps no UE assist  Supine bridge 3 sec hold 2 x 10 Runner stretch at wall c cues for pushing heel into ground 15 sec x 5 bilateral Leg press double leg 62 lbs x 10, single leg 2 x 10 37 lbs, performed bilateral  Neuro Re-ed  Standing church pew weight shift fwd/back ankle strategy x20  Tandem stance 1 min x 1 bilateral  Retro step x 15 bilateral    Manual  Supine Lt knee g3 IR/ER proximal tibia mobs, Distraction c flexion/IR mobilization c movement Lt knee   PATIENT EDUCATION:  07/07/2021 Education details: HEP, POC Person educated: Patient Education method: Consulting civil engineer, Demonstration, Verbal cues, and Handouts Education comprehension: verbalized understanding, returned demonstration, and verbal cues required     HOME EXERCISE PROGRAM: 07/07/2021 Access Code: [facility] URL: [email or web address] Date: 07/07/2021 Prepared by: Scot Jun   Exercises - Supine Quadricep Sets  - 3-5 x daily - 7 x weekly - 1 sets - 10 reps - 5 hold - Supine Heel Slide (Mirrored)  - 3-5 x daily - 7 x weekly - 1 sets - 10 reps - 2 hold - Seated Quad Set (Mirrored)  - 3-5 x daily - 7 x weekly - 1 sets - 10 reps - 5 hold - Seated Long Arc Quad (Mirrored)  - 3-5 x daily - 7 x weekly - 1 sets - 5-10 reps - 2 hold - Seated Straight Leg Heel Taps   - 1-2 x daily - 7 x weekly - 1-2 sets - 5-10 reps - Sit to Stand  - 1-2 x daily - 7 x weekly - 1-2 sets - 10 reps   ASSESSMENT:   CLINICAL IMPRESSION: Reviewed and discussed reducing difficulty level on some activity at gym to attempt to reduce after gym workout pain complaints.  Eccentric WB loading on steps continued to show challenging with mild symptoms even c reduced height of step.  Continued strengthening to improve control and functional mobility.    OBJECTIVE IMPAIRMENTS Abnormal gait, decreased activity tolerance, decreased balance, decreased coordination, decreased endurance, decreased mobility, difficulty walking, decreased ROM, decreased strength, hypomobility, increased edema, impaired perceived functional ability, impaired flexibility, improper body mechanics, and pain.    ACTIVITY LIMITATIONS cleaning, community activity, meal prep, laundry, and shopping.    PERSONAL FACTORS  HTN, hyperlipidemia, GERD, DM, RA  are also affecting patient's functional outcome.      REHAB POTENTIAL: Fair to good   CLINICAL DECISION MAKING: Stable/uncomplicated   EVALUATION COMPLEXITY: Low     GOALS: Goals reviewed with patient? Yes Eval: 07/07/2021  Short term PT Goals (target date for  Short term goals are 3 weeks 07/28/2021) Patient will demonstrate independent use of home exercise program to maintain progress from in clinic treatments. Goal status: MET - assessed 07/29/2021   Long term PT goals (target dates for all long term goals are 10 weeks  09/15/2021 )   1. Patient will demonstrate/report pain at worst less than or equal to 2/10 to facilitate minimal limitation in daily activity secondary to pain symptoms. Goal status: on going - assessed 07/29/2021   2. Patient will demonstrate independent use of home exercise program to facilitate ability to maintain/progress functional gains from skilled physical therapy services. Goal status: on going - assessed 07/29/2021   3. Patient will  demonstrate FOTO outcome > or = 63 % to indicate reduced disability due to condition. Goal status: on going - assessed 07/29/2021   4.  Patient will demonstrate Lt knee AROM 0-110 degrees s symptoms to facilitate ability to perform transfers, sitting, ambulation, stair navigation s restriction due to mobility. Goal status: on going - assessed 07/29/2021   5.  Patient will demonstrate bilateral LE MMT 5/5, dynamometry improvement of > = 10 lbs to facilitate ability to perform usual standing, walking, stairs at PLOF s limitation due to symptoms.   Goal status: on going - assessed 07/29/2021   6.  Patient will demonstrate/report ability to ascend/descend stairs c reciprocal gait pattern for household entry.   Goal status: on going - assessed 07/29/2021   7.  Patient will demonstrate/report ability to return to gym workout routine.  a.  Goal Status: on going - assessed 07/29/2021     PLAN: PT FREQUENCY: 1-2x/week   PT DURATION: 10 weeks   PLANNED INTERVENTIONS:  Therapeutic exercises, Therapeutic activity, Neuro Muscular re-education, Balance training, Gait training, Patient/Family education, Joint mobilization, Stair training, DME instructions, Dry Needling, Electrical stimulation, Cryotherapy, Moist heat, Taping, Ultrasound, Ionotophoresis 4mg /ml Dexamethasone, and Manual therapy.  All included unless contraindicated   PLAN FOR NEXT SESSION:   Continue strengthening to improve stair navigation.    Scot Jun, PT, DPT, OCS, ATC 08/12/21  9:24 AM

## 2021-08-13 NOTE — Progress Notes (Signed)
Called pt, no answer. Left vm requesting call back?

## 2021-08-13 NOTE — Progress Notes (Signed)
Patient called back and is aware of results.

## 2021-08-14 ENCOUNTER — Other Ambulatory Visit (INDEPENDENT_AMBULATORY_CARE_PROVIDER_SITE_OTHER): Payer: Self-pay | Admitting: Family Medicine

## 2021-08-14 DIAGNOSIS — Z794 Long term (current) use of insulin: Secondary | ICD-10-CM

## 2021-08-16 NOTE — Progress Notes (Unsigned)
Chief Complaint:   OBESITY Leslie Davis is here to discuss her progress with her obesity treatment plan along with follow-up of her obesity related diagnoses. Leslie Davis is on the Category 1 Plan with breakfast options and states she is following her eating plan approximately 95% of the time. Leslie Davis states she is walking and biking at the gym for 30 minutes 2 times per week.  Today's visit was #: 18 Starting weight: 208 lbs Starting date: 07/23/2020 Today's weight: 165 lbs Today's date: 08/10/2021 Total lbs lost to date: 43 Total lbs lost since last in-office visit: 0  Interim History: Leslie Davis is here for a follow up office visit. We reviewed her meal plan and all questions were answered. Patient's food recall appears to be accurate and consistent with what is on plan when she is following it. When eating on plan, her hunger and cravings are well controlled.     Leslie Davis is doing physical therapy twice weekly for health, balance, and strengthening. She is having occasional sweets cravings after dinner. She will snack on an apple or strawberries.  Subjective:   1. Type 2 diabetes mellitus with other specified complication, with long-term current use of insulin ([facility]) Leslie Davis's fasting blood sugar is in the 90 to 120 range, with a high of 138 and with no lows. She is on [place] and Ozempic and her A1c was 6.2 on 05/28/21. Leslie Davis is having occasional cravings, but is easily managed with healthy snacks.   2. Vitamin D deficiency Leslie Davis's vitamin D level was elevated in the past and she has been off all supplements since January, when is was at 69.  3. Coronary artery disease with angina pectoris, unspecified vessel or lesion type, unspecified whether native or transplanted heart (Leslie Davis) Leslie Davis had a blockage with coronary CTA at 934 and LAD 2nd diagonal.  Assessment/Plan:  No orders of the defined types were placed in this encounter.   There are no discontinued medications.   No  orders of the defined types were placed in this encounter.    1. Type 2 diabetes mellitus with other specified complication, with long-term current use of insulin (Leslie Davis) Leslie Davis agrees to continue taking her medications Ozempic 2 mg and Farxiga. She agrees to continue with her PCP for chronic diagnosis management office visits and treatments.  2. Vitamin D deficiency We will recheck Leslie Davis's vitamin D level in 6 months or so from her last lab check. She agrees to continue to follow her prudent nutrition plan and to continue with weight bearing exercises.  3. Coronary artery disease with angina pectoris, unspecified vessel or lesion type, unspecified whether native or transplanted heart Leslie Davis) Leslie Davis is scheduled for an angiogram on 08/18/21. Notes from Dr. Terri Skains from cardiology were reviewed. Leslie Davis is asymptomatic and stable. She has nitroglycerin, but she has not used it at all yet.  4. Obesity with current BMI of 28.4 Leslie Davis's goal weight is 160 to 170 pounds.  Leslie Davis is currently in the action stage of change. As such, her goal is to continue with weight loss efforts. She has agreed to the Category 1 Plan with breakfast options.   Exercise goals:  As is per her cardiologist.  Behavioral modification strategies: increasing lean protein intake, decreasing simple carbohydrates, better snacking choices, and avoiding temptations.  Leslie Davis has agreed to follow-up with our clinic in 3 or 4 weeks. She was informed of the importance of frequent follow-up visits to maximize her success with intensive lifestyle modifications for her multiple health conditions.  Objective:   Blood pressure (!) 147/73, pulse (!) 54, temperature 97.6 F (36.4 C), height '5\' 4"'$  (1.626 m), weight 165 lb (74.8 kg). Body mass index is 28.32 kg/m.  General: Cooperative, alert, well developed, in no acute distress. HEENT: Conjunctivae and lids unremarkable. Cardiovascular: Regular rhythm.  Lungs: Normal work of  breathing. Neurologic: No focal deficits.   Lab Results  Component Value Date   CREATININE 1.16 (H) 08/11/2021   BUN 24 08/11/2021   NA 139 08/11/2021   K 4.0 08/11/2021   CL 102 08/11/2021   CO2 22 08/11/2021   Lab Results  Component Value Date   ALT 37 06/25/2021   AST 47 (H) 06/25/2021   ALKPHOS 94 06/25/2021   BILITOT 0.3 06/25/2021   Lab Results  Component Value Date   HGBA1C 6.2 (H) 05/28/2021   HGBA1C 6.1 (H) 02/26/2021   HGBA1C 6.3 (H) 10/22/2020   HGBA1C 6.8 (H) 08/25/2020   HGBA1C 8.7 (H) 05/28/2020   No results found for: INSULIN Lab Results  Component Value Date   TSH 1.640 06/17/2021   Lab Results  Component Value Date   CHOL 156 11/20/2020   HDL 61 11/20/2020   LDLCALC 78 11/20/2020   TRIG 89 11/20/2020   CHOLHDL 2.6 11/20/2020   Lab Results  Component Value Date   VD25OH 82.1 03/31/2021   VD25OH 102.0 (H) 11/20/2020   VD25OH 95.2 10/22/2020   Lab Results  Component Value Date   WBC 3.4 08/11/2021   HGB 13.5 08/11/2021   HCT 40.8 08/11/2021   MCV 88 08/11/2021   PLT 261 08/11/2021   Lab Results  Component Value Date   IRON 68 11/16/2017   TIBC 355 11/16/2017   FERRITIN 45.2 11/16/2017    Obesity Behavioral Intervention:   Approximately 15 minutes were spent on the discussion below.  ASK: We discussed the diagnosis of obesity with Leslie Davis today and Leslie Davis agreed to give [place] permission to discuss obesity behavioral modification therapy today.  ASSESS: Leslie Davis has the diagnosis of obesity and her BMI today is 28.4. Leslie Davis is in the action stage of change.   ADVISE: Leslie Davis was educated on the multiple health risks of obesity as well as the benefit of weight loss to improve her health. She was advised of the need for long term treatment and the importance of lifestyle modifications to improve her current health and to decrease her risk of future health problems.  AGREE: Multiple dietary modification options and treatment options  were discussed and Leslie Davis agreed to follow the recommendations documented in the above note.  ARRANGE: Leslie Davis was educated on the importance of frequent visits to treat obesity as outlined per CMS and USPSTF guidelines and agreed to schedule her next follow up appointment today.  Attestation Statements:   Reviewed by clinician on day of visit: allergies, medications, problem list, medical history, surgical history, family history, social history, and previous encounter notes.  IMarcille Blanco, CMA, am acting as transcriptionist for [employer], DO  I have reviewed the above documentation for accuracy and completeness, and I agree with the above. Marjory Sneddon, D.O.  The [place] was signed into law in 2016 which includes the topic of electronic health records.  This provides immediate access to information in MyChart.  This includes consultation notes, operative notes, office notes, lab results and pathology reports.  If you have any questions about what you read please let us know at your next visit so we can discuss  your concerns and take corrective action if need be.  We are right here with you.

## 2021-08-18 ENCOUNTER — Ambulatory Visit (HOSPITAL_COMMUNITY)
Admission: RE | Admit: 2021-08-18 | Discharge: 2021-08-18 | Disposition: A | Discharge status: Home / Self Care | Payer: Medicare Other | Attending: Cardiology | Admitting: Cardiology

## 2021-08-18 ENCOUNTER — Other Ambulatory Visit: Payer: Self-pay

## 2021-08-18 ENCOUNTER — Ambulatory Visit (HOSPITAL_COMMUNITY)
Admission: RE | Disposition: A | Discharge status: Home / Self Care | Payer: Self-pay | Source: Home / Self Care | Attending: Cardiology

## 2021-08-18 DIAGNOSIS — Z87891 Personal history of nicotine dependence: Secondary | ICD-10-CM | POA: Insufficient documentation

## 2021-08-18 DIAGNOSIS — Z794 Long term (current) use of insulin: Secondary | ICD-10-CM | POA: Diagnosis not present

## 2021-08-18 DIAGNOSIS — I25118 Atherosclerotic heart disease of native coronary artery with other forms of angina pectoris: Secondary | ICD-10-CM | POA: Insufficient documentation

## 2021-08-18 DIAGNOSIS — I10 Essential (primary) hypertension: Secondary | ICD-10-CM | POA: Diagnosis not present

## 2021-08-18 DIAGNOSIS — K219 Gastro-esophageal reflux disease without esophagitis: Secondary | ICD-10-CM | POA: Diagnosis not present

## 2021-08-18 DIAGNOSIS — E1122 Type 2 diabetes mellitus with diabetic chronic kidney disease: Secondary | ICD-10-CM | POA: Insufficient documentation

## 2021-08-18 DIAGNOSIS — N1831 Chronic kidney disease, stage 3a: Secondary | ICD-10-CM | POA: Insufficient documentation

## 2021-08-18 DIAGNOSIS — I129 Hypertensive chronic kidney disease with stage 1 through stage 4 chronic kidney disease, or unspecified chronic kidney disease: Secondary | ICD-10-CM | POA: Diagnosis not present

## 2021-08-18 DIAGNOSIS — R0609 Other forms of dyspnea: Secondary | ICD-10-CM | POA: Insufficient documentation

## 2021-08-18 DIAGNOSIS — I2510 Atherosclerotic heart disease of native coronary artery without angina pectoris: Secondary | ICD-10-CM | POA: Diagnosis not present

## 2021-08-18 DIAGNOSIS — Z7985 Long-term (current) use of injectable non-insulin antidiabetic drugs: Secondary | ICD-10-CM | POA: Diagnosis not present

## 2021-08-18 DIAGNOSIS — Z7984 Long term (current) use of oral hypoglycemic drugs: Secondary | ICD-10-CM | POA: Diagnosis not present

## 2021-08-18 DIAGNOSIS — E785 Hyperlipidemia, unspecified: Secondary | ICD-10-CM | POA: Insufficient documentation

## 2021-08-18 DIAGNOSIS — I2584 Coronary atherosclerosis due to calcified coronary lesion: Secondary | ICD-10-CM | POA: Insufficient documentation

## 2021-08-18 DIAGNOSIS — I700 Atherosclerosis of aorta: Secondary | ICD-10-CM | POA: Insufficient documentation

## 2021-08-18 HISTORY — PX: LEFT HEART CATH AND CORONARY ANGIOGRAPHY: CATH118249

## 2021-08-18 LAB — GLUCOSE, CAPILLARY
Glucose-Capillary: 86 mg/dL (ref 70–99)
Glucose-Capillary: 77 mg/dL (ref 70–99)

## 2021-08-18 SURGERY — LEFT HEART CATH AND CORONARY ANGIOGRAPHY
Anesthesia: LOCAL

## 2021-08-18 MED ORDER — SODIUM CHLORIDE 0.9% FLUSH
3.0000 mL | Freq: Two times a day (BID) | INTRAVENOUS | Status: DC
Start: 2021-08-18 — End: 2021-08-18

## 2021-08-18 MED ORDER — IOHEXOL 350 MG/ML SOLN
INTRAVENOUS | Status: DC | PRN
  Administered 2021-08-18: 50 mL

## 2021-08-18 MED ORDER — HEPARIN (PORCINE) IN NACL 1000-0.9 UT/500ML-% IV SOLN
INTRAVENOUS | Status: AC
  Filled 2021-08-18: qty 1000

## 2021-08-18 MED ORDER — MIDAZOLAM HCL 2 MG/2ML IJ SOLN
INTRAMUSCULAR | Status: AC
  Filled 2021-08-18: qty 2

## 2021-08-18 MED ORDER — LABETALOL HCL 5 MG/ML IV SOLN: 10.0000 mg | INTRAVENOUS | Status: DC | PRN

## 2021-08-18 MED ORDER — ONDANSETRON HCL 4 MG/2ML IJ SOLN: 4.0000 mg | Freq: Four times a day (QID) | INTRAMUSCULAR | Status: DC | PRN

## 2021-08-18 MED ORDER — VERAPAMIL HCL 2.5 MG/ML IV SOLN
INTRAVENOUS | Status: DC | PRN
  Administered 2021-08-18: 10 mL via INTRA_ARTERIAL

## 2021-08-18 MED ORDER — ASPIRIN 81 MG PO CHEW: 81.0000 mg | CHEWABLE_TABLET | ORAL | Status: DC

## 2021-08-18 MED ORDER — SODIUM CHLORIDE 0.9 % IV SOLN: 250.0000 mL | INTRAVENOUS | Status: DC | PRN

## 2021-08-18 MED ORDER — LIDOCAINE HCL (PF) 1 % IJ SOLN
INTRAMUSCULAR | Status: AC
  Filled 2021-08-18: qty 30

## 2021-08-18 MED ORDER — HEPARIN SODIUM (PORCINE) 1000 UNIT/ML IJ SOLN
INTRAMUSCULAR | Status: AC
  Filled 2021-08-18: qty 10

## 2021-08-18 MED ORDER — SODIUM CHLORIDE 0.9 % IV SOLN
INTRAVENOUS | Status: AC
Start: 2021-08-18 — End: 2021-08-18

## 2021-08-18 MED ORDER — SODIUM CHLORIDE 0.9 % WEIGHT BASED INFUSION: 3.0000 mL/kg/h | INTRAVENOUS | Status: AC

## 2021-08-18 MED ORDER — HEPARIN SODIUM (PORCINE) 1000 UNIT/ML IJ SOLN
INTRAMUSCULAR | Status: DC | PRN
  Administered 2021-08-18: 4000 [IU] via INTRAVENOUS

## 2021-08-18 MED ORDER — VERAPAMIL HCL 2.5 MG/ML IV SOLN
INTRAVENOUS | Status: AC
  Filled 2021-08-18: qty 2

## 2021-08-18 MED ORDER — HEPARIN (PORCINE) IN NACL 1000-0.9 UT/500ML-% IV SOLN
INTRAVENOUS | Status: DC | PRN
  Administered 2021-08-18 (×2): 500 mL

## 2021-08-18 MED ORDER — SODIUM CHLORIDE 0.9% FLUSH: 3.0000 mL | INTRAVENOUS | Status: DC | PRN

## 2021-08-18 MED ORDER — ACETAMINOPHEN 325 MG PO TABS
650.0000 mg | ORAL_TABLET | ORAL | Status: DC | PRN
Start: 2021-08-18 — End: 2021-08-18

## 2021-08-18 MED ORDER — SODIUM CHLORIDE 0.9 % IV SOLN
INTRAVENOUS | Status: DC | PRN
  Administered 2021-08-18: 10 mL/h via INTRAVENOUS

## 2021-08-18 MED ORDER — FENTANYL CITRATE (PF) 100 MCG/2ML IJ SOLN
INTRAMUSCULAR | Status: DC | PRN
  Administered 2021-08-18: 50 ug via INTRAVENOUS

## 2021-08-18 MED ORDER — SODIUM CHLORIDE 0.9 % WEIGHT BASED INFUSION
1.0000 mL/kg/h | INTRAVENOUS | Status: DC
Start: 2021-08-18 — End: 2021-08-18

## 2021-08-18 MED ORDER — FENTANYL CITRATE (PF) 100 MCG/2ML IJ SOLN
INTRAMUSCULAR | Status: AC
  Filled 2021-08-18: qty 2

## 2021-08-18 MED ORDER — HYDRALAZINE HCL 20 MG/ML IJ SOLN: 10.0000 mg | INTRAMUSCULAR | Status: DC | PRN

## 2021-08-18 MED ORDER — MIDAZOLAM HCL 2 MG/2ML IJ SOLN
INTRAMUSCULAR | Status: DC | PRN
  Administered 2021-08-18: 1 mg via INTRAVENOUS

## 2021-08-18 SURGICAL SUPPLY — 12 items
BAND CMPR LRG ZPHR (HEMOSTASIS) ×1
BAND ZEPHYR COMPRESS 30 LONG (HEMOSTASIS) ×1 IMPLANT
CATH INFINITI 5 FR 3DRC (CATHETERS) ×1 IMPLANT
CATH OPTITORQUE TIG 4.0 5F (CATHETERS) ×1 IMPLANT
GLIDESHEATH SLEND A-KIT 6F 22G (SHEATH) ×1 IMPLANT
GUIDEWIRE INQWIRE 1.5J.035X260 (WIRE) IMPLANT
INQWIRE 1.5J .035X260CM (WIRE) ×2
KIT HEART LEFT (KITS) ×2 IMPLANT
PACK CARDIAC CATHETERIZATION (CUSTOM PROCEDURE TRAY) ×2 IMPLANT
SHEATH PROBE COVER 6X72 (BAG) ×1 IMPLANT
TRANSDUCER W/STOPCOCK (MISCELLANEOUS) ×2 IMPLANT
TUBING CIL FLEX 10 FLL-RA (TUBING) ×2 IMPLANT

## 2021-08-18 NOTE — H&P (Signed)
OV 08/03/2021 copied for documentation    ID:  Leslie Davis, DOB Jan 22, 1956, MRN [id number]  PCP:  [name], MD  Cardiologist:  Rex Kras, DO, [facility] (established care July 09, 2021) Former Cardiology Providers: Dr. Minus Breeding.   Date: 08/03/21 Last Office Visit: 07/09/2021  Chief Complaint  Patient presents with   Follow-up    Reevaluation of chest pain and discuss test results    HPI  Leslie Davis is a 66 y.o. African-American female whose past medical history and cardiovascular risk factors include: Coronary artery calcification, hypertension, hyperlipidemia, GERD, fatty liver disease, IDDM Type II, nephrolithiasis, advanced age.  Patient was referred to the practice after being evaluated at [facility] long ED for shortness of breath/chest tightness.  Her characteristics of chest discomfort appears to be likely cardiac and given her other risk factors such as insulin-dependent diabetes, CAC, hyperlipidemia and hypertension patient was recommended to undergo an echo and coronary CTA.  She is asked to come in sooner than her scheduled appointment to discuss test results.  Since last office visit patient states that her precordial discomfort is improved with up titration of medical therapy but at times still services.  Results of the coronary CTA discussed with her in great detail including images findings noted below for further reference.  Home blood pressures are well controlled around 259 mmHg and diastolic blood pressures are between 60-70 mmHg.  She continues to have reduced physical endurance compared to the past.  ALLERGIES: Allergies  Allergen Reactions   Fiasp [Insulin Aspart (W-Niacinamide)] Hives    MEDICATION LIST PRIOR TO VISIT: Current Meds  Medication Sig   acetaminophen-codeine (TYLENOL #3) 300-30 MG tablet Take 1 tablet by mouth every 8 (eight) hours as needed for moderate pain.   amLODipine (NORVASC) 10 MG tablet TAKE ONE TABLET BY MOUTH ONCE  DAILY   aspirin EC 81 MG tablet Take 1 tablet (81 mg total) by mouth 2 (two) times daily. To be taken after surgery   Azilsartan Medoxomil (EDARBI) 80 MG TABS TAKE ONE TABLET BY MOUTH ONCE DAILY   Blood Glucose Monitoring Suppl (ONETOUCH VERIO) w/Device KIT Use as directed to check blood sugars 2 times per day dx: e11.65   cetirizine (ZYRTEC) 10 MG tablet Take 1 tablet (10 mg total) by mouth daily.   dapagliflozin propanediol (FARXIGA) 10 MG TABS tablet Take 1 tablet (10 mg total) by mouth daily before breakfast.   ferrous gluconate (FERGON) 324 MG tablet TAKE 1 TABLET BY MOUTH TWICE DAILY WITH A MEAL. (Patient taking differently: Take 324 mg by mouth 2 (two) times daily with a meal.)   glucose blood (ONETOUCH VERIO) test strip Use as instructed to check blood sugars 2 times per day dx:e11.65   insulin lispro (HUMALOG KWIKPEN) 100 UNIT/ML KwikPen Sliding scale   linaclotide ([name]) 145 MCG CAPS capsule Take 1 capsule (145 mcg total) by mouth daily.   metoprolol succinate (TOPROL XL) 25 MG 24 hr tablet Take 1 tablet (25 mg total) by mouth daily.   nitroGLYCERIN (NITROSTAT) 0.4 MG SL tablet Place 1 tablet (0.4 mg total) under the tongue every 5 (five) minutes as needed for chest pain. If you require more than two tablets five minutes apart go to the nearest ER via EMS.   OneTouch Delica Lancets 56L MISC Use as directed to check blood sugars 2 times per day dx:e11.65   pantoprazole (PROTONIX) 40 MG tablet Take one tablet by mouth 3 days a week   rosuvastatin (CRESTOR) 20 MG tablet TAKE  ONE TABLET BY MOUTH ONCE DAILY   Semaglutide, 2 MG/DOSE, 8 MG/3ML SOPN Inject 2 mg as directed once a week.   temazepam (RESTORIL) 30 MG capsule TAKE ONE CAPSULE BY MOUTH EVERYDAY AT BEDTIME AS NEEDED   TRUEPLUS PEN NEEDLES 31G X 6 MM MISC USE WITH pen TO INJECT insulin DAILY   [DISCONTINUED] metoprolol tartrate (LOPRESSOR) 25 MG tablet Take 1 tablet (25 mg total) by mouth 2 (two) times daily for 7 days. Start two  days before your CT Scan.     PAST MEDICAL HISTORY: Past Medical History:  Diagnosis Date   DM (diabetes mellitus) ([place])    Fatty liver disease, nonalcoholic    Gastritis    Gastroparesis    GERD (gastroesophageal reflux disease)    Heartburn    Hiatal hernia    HLD (hyperlipidemia)    HTN (hypertension)    Joint pain    Renal calculus    Rheumatoid arthritis ([place])    Vitamin D deficiency     PAST SURGICAL HISTORY: Past Surgical History:  Procedure Laterality Date   CHOLECYSTECTOMY     COLONOSCOPY     TOTAL KNEE ARTHROPLASTY Left 10/27/2020   Procedure: LEFT TOTAL KNEE ARTHROPLASTY;  Surgeon: [name], MD;  Location: [employer];  Service: Orthopedics;  Laterality: Left;   TUBAL LIGATION     UPPER GASTROINTESTINAL ENDOSCOPY      FAMILY HISTORY: The patient family history includes COPD in her sister; Diabetes in her brother, maternal grandmother, mother, and sister; Heart disease in her sister; Heart disease (age of onset: 43) in her brother; Hyperlipidemia in her father; Hypertension in her father; Kidney disease in her mother and sister; Stomach cancer in her maternal aunt; Stroke in her father; Sudden death in her father; Thyroid disease in her mother.  SOCIAL HISTORY:  The patient  reports that she quit smoking about 46 years ago. Her smoking use included cigarettes. She has a 0.25 pack-year smoking history. She has never used smokeless tobacco. She reports that she does not drink alcohol and does not use drugs.  REVIEW OF SYSTEMS: Review of Systems  Constitutional: Positive for malaise/fatigue.  Cardiovascular:  Positive for chest pain (tightness) and dyspnea on exertion. Negative for leg swelling, near-syncope, palpitations, paroxysmal nocturnal dyspnea and syncope.  Respiratory:  Positive for shortness of breath.    PHYSICAL EXAM:    08/03/2021    3:09 PM 07/22/2021    4:20 PM 07/22/2021    3:39 PM  Vitals with BMI  Height $Remov'5\' 4"'YwtFPb$     Weight 171 lbs    BMI 28.78     Systolic [phone]  Diastolic 70 63 74  Pulse 59 57 46    CONSTITUTIONAL: Well-developed and well-nourished. No acute distress.  SKIN: Skin is warm and dry. No rash noted. No cyanosis. No pallor. No jaundice HEAD: Normocephalic and atraumatic.  EYES: No scleral icterus MOUTH/THROAT: Moist oral membranes.  NECK: No JVD present. No thyromegaly noted. No carotid bruits  LYMPHATIC: No visible cervical adenopathy.  CHEST Normal respiratory effort. No intercostal retractions  LUNGS: Clear to auscultation bilaterally.  No stridor. No wheezes. No rales.  CARDIOVASCULAR: Regular rate and rhythm, positive S1-S2, no murmurs rubs or gallops appreciated. ABDOMINAL: Obese, soft, nontender, nondistended, positive bowel sounds all 4 quadrants. No apparent ascites.  EXTREMITIES: No peripheral edema, warm to touch, +2 bilateral DP and PT pulses HEMATOLOGIC: No significant bruising NEUROLOGIC: Oriented to person, place, and time. Nonfocal. Normal muscle tone.  PSYCHIATRIC: Normal mood  and affect. Normal behavior. Cooperative  RADIOLOGY CTA PE protocol: June 25, 2021 1. No pulmonary embolus or acute intrathoracic abnormality. 2. Coronary artery calcifications. Aortic Atherosclerosis (ICD10-I70.0)  CARDIAC DATABASE: EKG: 07/09/2021: Normal sinus rhythm, 60 bpm, old anteroseptal infarct, TWI in lateral leads consider possible ischemia.   Echocardiogram: 07/15/2021:  Normal LV systolic function with EF 63%. Left ventricle cavity is normal  in size. Mild concentric remodeling of the left ventricle. Normal global  wall motion. Normal diastolic filling pattern. Calculated EF 63%.  Left atrial cavity is mildly dilated by volume.  Structurally normal tricuspid valve.  Mild tricuspid regurgitation. No  evidence of pulmonary hypertension.   Stress Testing: 11/03/2018 [facility] available in Care Everywhere: Per report: No reversible ischemia or infarction, normal  wall motion, calculated LVEF 84%, low risk study  CCTA: 07/22/2021 1. Total coronary calcium score of 341. This was 93rd percentile for age and sex matched control. 2. Normal coronary origin with co-dominance. 3. CAD-RADS = 3.  Left Main: Patent.   LAD: Mild stenosis (25-49%) at the proximal LAD due to calcified plaque. Moderate stenosis (50-69%) at mid LAD after the take off of second diagonal branch due to mixed eccentric plaque (lesion is about 2 cm in length). Mild stenosis (25-49%) due to calcified plaque within the distal and apical LAD. First Diagonal branch is patent. Second diagonal branch patent with calcified plaque at the mid-segment.   LCx: Patent.   RCA: Moderate stenosis (50-69%) at the ostial RCA due to eccentric mixed plaque and remainder of the vessel is patent.   4. Aortic atherosclerosis.   5. Study is sent for CT-FFR to further evaluate the RCA and LAD disease. Findings will be performed and reported separately.  6. No acute or significant incidental extracardiac findings in the chest.  CTFFR  May 2023: CT FFR analysis illustrates hemodynamically significant stenosis within the mid LAD (distal to second diagonal branch).  Heart Catheterization: None  LABORATORY DATA:    Latest Ref Rng & Units 06/25/2021    1:50 PM 12/23/2020    3:22 PM 11/20/2020   11:32 AM  CBC  WBC 4.0 - 10.5 K/uL 4.5   5.1   4.4    Hemoglobin 12.0 - 15.0 g/dL 13.6   11.2   11.7    Hematocrit 36.0 - 46.0 % 40.9   34.9   35.0    Platelets 150 - 400 K/uL 245   256.0   325         Latest Ref Rng & Units 06/25/2021    1:50 PM 05/28/2021   12:52 PM 02/26/2021   12:06 PM  CMP  Glucose 70 - 99 mg/dL 93   79   74    BUN 8 - 23 mg/dL 34   20   23    Creatinine 0.44 - 1.00 mg/dL 1.16   1.08   1.17    Sodium 135 - 145 mmol/L 138   141   142    Potassium 3.5 - 5.1 mmol/L 4.3   4.9   4.6    Chloride 98 - 111 mmol/L 107   104   102    CO2 22 - 32 mmol/L $RemoveB'25   20   23    '[id number]$ Calcium 8.9 - 10.3 mg/dL  9.5   10.0   9.6    Total Protein 6.5 - 8.1 g/dL 7.9   7.2   7.4    Total Bilirubin 0.3 - 1.2 mg/dL  0.3   0.2   <0.2    Alkaline Phos 38 - 126 U/L 94   128   142    AST 15 - 41 U/L 47   24   17    ALT 0 - 44 U/L 37   30   17      Lipid Panel  Lab Results  Component Value Date   CHOL 156 11/20/2020   HDL 61 11/20/2020   LDLCALC 78 11/20/2020   TRIG 89 11/20/2020   CHOLHDL 2.6 11/20/2020     No components found for: NTPROBNP No results for input(s): PROBNP in the last 8760 hours. Recent Labs    06/17/21 1606  TSH 1.640    BMP Recent Labs    10/22/20 1430 10/28/20 0419 11/20/20 1132 02/26/21 1206 05/28/21 1252 06/25/21 1350  NA 138 134*   < > 142 141 138  K 3.9 3.6   < > 4.6 4.9 4.3  CL 106 102   < > 102 104 107  CO2 23 24   < > $R'23 20 25  'LU$ GLUCOSE 80 91   < > 74 79 93  BUN 20 9   < > 23 20 34*  CREATININE 1.30* 1.00   < > 1.17* 1.08* 1.16*  CALCIUM 9.6 9.0   < > 9.6 10.0 9.5  GFRNONAA 45* >60  --   --   --  52*   < > = values in this interval not displayed.    HEMOGLOBIN A1C Lab Results  Component Value Date   HGBA1C 6.2 (H) 05/28/2021   MPG 134.11 10/22/2020    IMPRESSION:    ICD-10-CM   1. Anginal equivalent (HCC)  I20.8 metoprolol succinate (TOPROL XL) 25 MG 24 hr tablet    nitroGLYCERIN (NITROSTAT) 0.4 MG SL tablet    CBC    Basic metabolic panel    2. Dyspnea on exertion  R06.09 metoprolol succinate (TOPROL XL) 25 MG 24 hr tablet    nitroGLYCERIN (NITROSTAT) 0.4 MG SL tablet    3. Coronary atherosclerosis due to calcified coronary lesion  I25.10 metoprolol succinate (TOPROL XL) 25 MG 24 hr tablet   I25.84 nitroGLYCERIN (NITROSTAT) 0.4 MG SL tablet    4. Atherosclerosis of aorta (HCC)  I70.0     5. Type 2 diabetes mellitus with stage 3a chronic kidney disease, with long-term current use of insulin (HCC)  E11.22    N18.31    Z79.4     6. Benign hypertension  I10     7. Type 2 diabetes mellitus with hyperlipidemia [facility])  E11.69    E78.5         RECOMMENDATIONS: SAKEENAH VALCARCEL is a 66 y.o. African-American female whose past medical history and cardiac risk factors include: Coronary artery calcification, hypertension, hyperlipidemia, GERD, fatty liver disease, IDDM Type II, nephrolithiasis, advanced age.  Anginal equivalent ([place]) I suspect that her reduced functional capacity, dyspnea on exertion, and precordial discomfort are likely her anginal equivalents. She has multiple cardiovascular risk factors as noted above. Recent coronary CTA noted moderate coronary artery calcification placing her at the 93rd percentile. CTA images reviewed with the patient which notes at least a moderate degree of stenosis in the LAD distribution after the second diagonal branch.  This finding is hemodynamically significant based on CT FFR results. Given her recent ER visit, symptoms, and current work-up recommended invasive angiography to evaluate for obstructive CAD and possible coronary intervention. Risks, benefits, and alternatives for left heart catheterization with  possible intervention discussed with the patient at today's office visit. Sublingual nitroglycerin tablets to use on a as needed basis. We will start Toprol-XL 25 mg p.o. daily.  The procedure of left heart catheterization with possible intervention was explained to the patient in detail.  The indication, alternatives, risks and benefits were reviewed.  Complications include but not limited to bleeding, infection, vascular injury, stroke, myocardial infarction, arrhythmia (requiring medical or cardiopulmonary resuscitation), kidney injury (requiring short-term or long-term hemodialysis), radiation-related injury in the case of prolonged fluoroscopy use, emergent cardiac surgery, temporary or permanent pacemaker, and death. The patient understands the risks of serious complication is 1-2 in 3976 with diagnostic cardiac cath and 1-2% or less with angioplasty/stenting. The patient   voices understanding and provides verbal feedback  questions and concerns are addressed to her satisfaction and patient wishes to proceed with coronary angiography with possible PCI.   Dyspnea on exertion See above  Coronary atherosclerosis due to calcified coronary lesion  / Atherosclerosis of aorta (HCC) Total CAC 341, 93rd percentile. Continue aspirin 81 mg p.o. daily. Continue rosuvastatin. Ischemic work-up discussed with the patient at today's visit and noted above for further reference. Further recommendations to follow.  Type 2 diabetes mellitus with stage 3a chronic kidney disease, with long-term current use of insulin ([place]) Educated the importance of glycemic control. Currently on ARB, statin therapy, Farxiga  Benign hypertension Office blood pressures are well controlled. Medications reconciled. Management discussed but no changes made.  Type 2 diabetes mellitus with hyperlipidemia (HCC) Continue rosuvastatin. Does not endorse myalgias. Managed by PCP.  We will follow peripherally.   FINAL MEDICATION LIST END OF ENCOUNTER: Meds ordered this encounter  Medications   metoprolol succinate (TOPROL XL) 25 MG 24 hr tablet    Sig: Take 1 tablet (25 mg total) by mouth daily.    Dispense:  90 tablet    Refill:  0   nitroGLYCERIN (NITROSTAT) 0.4 MG SL tablet    Sig: Place 1 tablet (0.4 mg total) under the tongue every 5 (five) minutes as needed for chest pain. If you require more than two tablets five minutes apart go to the nearest ER via EMS.    Dispense:  30 tablet    Refill:  0    Medications Discontinued During This Encounter  Medication Reason   metoprolol tartrate (LOPRESSOR) 25 MG tablet    methocarbamol (ROBAXIN) 500 MG tablet      Current Outpatient Medications:    acetaminophen-codeine (TYLENOL #3) 300-30 MG tablet, Take 1 tablet by mouth every 8 (eight) hours as needed for moderate pain., Disp: 30 tablet, Rfl: 0   amLODipine (NORVASC) 10 MG tablet, TAKE ONE  TABLET BY MOUTH ONCE DAILY, Disp: 90 tablet, Rfl: 3   aspirin EC 81 MG tablet, Take 1 tablet (81 mg total) by mouth 2 (two) times daily. To be taken after surgery, Disp: 84 tablet, Rfl: 0   Azilsartan Medoxomil (EDARBI) 80 MG TABS, TAKE ONE TABLET BY MOUTH ONCE DAILY, Disp: 90 tablet, Rfl: 2   Blood Glucose Monitoring Suppl (ONETOUCH VERIO) w/Device KIT, Use as directed to check blood sugars 2 times per day dx: e11.65, Disp: 1 kit, Rfl: 1   cetirizine (ZYRTEC) 10 MG tablet, Take 1 tablet (10 mg total) by mouth daily., Disp: 90 tablet, Rfl: 2   dapagliflozin propanediol (FARXIGA) 10 MG TABS tablet, Take 1 tablet (10 mg total) by mouth daily before breakfast., Disp: 90 tablet, Rfl: 1   ferrous gluconate (FERGON) 324 MG tablet, TAKE 1  TABLET BY MOUTH TWICE DAILY WITH A MEAL. (Patient taking differently: Take 324 mg by mouth 2 (two) times daily with a meal.), Disp: 100 tablet, Rfl: 0   glucose blood (ONETOUCH VERIO) test strip, Use as instructed to check blood sugars 2 times per day dx:e11.65, Disp: 150 each, Rfl: 3   insulin lispro (HUMALOG KWIKPEN) 100 UNIT/ML KwikPen, Sliding scale, Disp: 30 mL, Rfl: 2   linaclotide ([name]) 145 MCG CAPS capsule, Take 1 capsule (145 mcg total) by mouth daily., Disp: 30 capsule, Rfl: 0   metoprolol succinate (TOPROL XL) 25 MG 24 hr tablet, Take 1 tablet (25 mg total) by mouth daily., Disp: 90 tablet, Rfl: 0   nitroGLYCERIN (NITROSTAT) 0.4 MG SL tablet, Place 1 tablet (0.4 mg total) under the tongue every 5 (five) minutes as needed for chest pain. If you require more than two tablets five minutes apart go to the nearest ER via EMS., Disp: 30 tablet, Rfl: 0   OneTouch Delica Lancets 28F MISC, Use as directed to check blood sugars 2 times per day dx:e11.65, Disp: 150 each, Rfl: 3   pantoprazole (PROTONIX) 40 MG tablet, Take one tablet by mouth 3 days a week, Disp: 90 tablet, Rfl: 2   rosuvastatin (CRESTOR) 20 MG tablet, TAKE ONE TABLET BY MOUTH ONCE DAILY, Disp: 90 tablet,  Rfl: 2   Semaglutide, 2 MG/DOSE, 8 MG/3ML SOPN, Inject 2 mg as directed once a week., Disp: 9 mL, Rfl: 0   temazepam (RESTORIL) 30 MG capsule, TAKE ONE CAPSULE BY MOUTH EVERYDAY AT BEDTIME AS NEEDED, Disp: 30 capsule, Rfl: 2   TRUEPLUS PEN NEEDLES 31G X 6 MM MISC, USE WITH pen TO INJECT insulin DAILY, Disp: 150 each, Rfl: 3  Orders Placed This Encounter  Procedures   CBC   Basic metabolic panel    There are no Patient Instructions on file for this visit.   --Continue cardiac medications as reconciled in final medication list. --Return in about 3 weeks (around 08/24/2021) for Follow up, Post heart catheterization. Or sooner if needed. --Continue follow-up with your primary care physician regarding the management of your other chronic comorbid conditions.  Patient's questions and concerns were addressed to her satisfaction. She voices understanding of the instructions provided during this encounter.   This note was created using a voice recognition software as a result there may be grammatical errors inadvertently enclosed that do not reflect the nature of this encounter. Every attempt is made to correct such errors.  Rex Kras, [place], [facility]  Pager: [phone] Office: [phone]

## 2021-08-18 NOTE — Interval H&P Note (Signed)
History and Physical Interval Note:  08/18/2021 2:23 PM  Leslie Davis  has presented today for surgery, with the diagnosis of doe.  The various methods of treatment have been discussed with the patient and family. After consideration of risks, benefits and other options for treatment, the patient has consented to  Procedure(s): LEFT HEART CATH AND CORONARY ANGIOGRAPHY (N/A) as a surgical intervention.  The patient's history has been reviewed, patient examined, no change in status, stable for surgery.  I have reviewed the patient's chart and labs.  Questions were answered to the patient's satisfaction.    2016/2017 Appropriate Use Criteria for Coronary Revascularization Symptom Status: Ischemic Symptoms  Non-invasive Testing: Intermediate Risk  If no or indeterminate stress test, FFR/iFR results in all diseased vessels: N/A  Diabetes Mellitus: No  S/P CABG: No  Antianginal therapy (number of long-acting drugs): >=2  Patient undergoing renal transplant: No  Patient undergoing percutaneous valve procedure: No  1 Vessel Disease PCI CABG  No proximal LAD involvement, No proximal left dominant LCX involvement A (8); Indication 2 M (6); Indication 2  Proximal left dominant LCX involvement A (8); Indication 5 A (8); Indication 5  Proximal LAD involvement A (8); Indication 5 A (8); Indication 5  2 Vessel Disease  No proximal LAD involvement A (8); Indication 8 A (7); Indication 8  Proximal LAD involvement A (8); Indication 11 A (8); Indication 11  3 Vessel Disease  Low disease complexity (e.g., focal stenoses, SYNTAX <=22) A (8); Indication 17 A (8); Indication 17  Intermediate or high disease complexity (e.g., SYNTAX >=23) M (6); Indication 21 A (9); Indication 21  Left Main Disease  Isolated LMCA disease: ostial or midshaft A (7); Indication 24 A (9); Indication 24  Isolated LMCA disease: bifurcation involvement M (6); Indication 25 A (9); Indication 25  LMCA ostial or midshaft,  concurrent low disease burden multivessel disease (e.g., 1-2 additional focal stenoses, SYNTAX <=22) A (7); Indication 26 A (9); Indication 26  LMCA ostial or midshaft, concurrent intermediate or high disease burden multivessel disease (e.g., 1-2 additional bifurcation stenoses, long stenoses, SYNTAX >=23) M (4); Indication 27 A (9); Indication 27  LMCA bifurcation involvement, concurrent low disease burden multivessel disease (e.g., 1-2 additional focal stenoses, SYNTAX <=22) M (6); Indication 28 A (9); Indication 28  LMCA bifurcation involvement, concurrent intermediate or high disease burden multivessel disease (e.g., 1-2 additional bifurcation stenoses, long stenoses, SYNTAX >=23) R (3); Indication 29 A (9); Indication [place]

## 2021-08-19 ENCOUNTER — Encounter (HOSPITAL_COMMUNITY): Payer: Self-pay | Admitting: Cardiology

## 2021-08-19 MED FILL — Lidocaine HCl Local Preservative Free (PF) Inj 1%: INTRAMUSCULAR | Qty: 30 | Status: AC

## 2021-08-20 ENCOUNTER — Other Ambulatory Visit: Payer: Self-pay

## 2021-08-20 DIAGNOSIS — Z794 Long term (current) use of insulin: Secondary | ICD-10-CM

## 2021-08-20 MED ORDER — SEMAGLUTIDE (2 MG/DOSE) 8 MG/3ML ~~LOC~~ SOPN: 2.0000 mg | PEN_INJECTOR | SUBCUTANEOUS | 0 refills | Status: DC

## 2021-08-27 ENCOUNTER — Encounter: Payer: Self-pay | Admitting: Rehabilitative and Restorative Service Providers"

## 2021-08-27 ENCOUNTER — Ambulatory Visit (INDEPENDENT_AMBULATORY_CARE_PROVIDER_SITE_OTHER): Payer: Medicare Other | Admitting: Rehabilitative and Restorative Service Providers"

## 2021-08-27 DIAGNOSIS — R600 Localized edema: Secondary | ICD-10-CM

## 2021-08-27 DIAGNOSIS — M25562 Pain in left knee: Secondary | ICD-10-CM

## 2021-08-27 DIAGNOSIS — M6281 Muscle weakness (generalized): Secondary | ICD-10-CM

## 2021-08-27 DIAGNOSIS — R262 Difficulty in walking, not elsewhere classified: Secondary | ICD-10-CM | POA: Diagnosis not present

## 2021-08-27 DIAGNOSIS — G8929 Other chronic pain: Secondary | ICD-10-CM

## 2021-08-27 NOTE — Therapy (Signed)
OUTPATIENT PHYSICAL THERAPY TREATMENT NOTE   Patient Name: Leslie Davis MRN: [id number] DOB:Jul 01, 1955, 66 y.o., female Today's Date: 08/27/2021  PCP: Dorothyann Peng, MD REFERRING PROVIDER: Cristie Hem, PA-C  END OF SESSION:   PT End of Session - 08/27/21 1556     Visit Number 6    Number of Visits 20    Date for PT Re-Evaluation 09/15/21    Authorization Type [employer] Medicare 20% Coinsurance    Progress Note Due on Visit 10    PT Start Time 1553    PT Stop Time 1632    PT Time Calculation (min) 39 min    Activity Tolerance Patient tolerated treatment well    Behavior During Therapy WFL for tasks assessed/performed                 Past Medical History:  Diagnosis Date   DM (diabetes mellitus) (HCC)    Fatty liver disease, nonalcoholic    Gastritis    Gastroparesis    GERD (gastroesophageal reflux disease)    Heartburn    Hiatal hernia    HLD (hyperlipidemia)    HTN (hypertension)    Joint pain    Renal calculus    Rheumatoid arthritis (HCC)    Vitamin D deficiency    Past Surgical History:  Procedure Laterality Date   CHOLECYSTECTOMY     COLONOSCOPY     LEFT HEART CATH AND CORONARY ANGIOGRAPHY N/A 08/18/2021   Procedure: LEFT HEART CATH AND CORONARY ANGIOGRAPHY;  Surgeon: Elder Negus, MD;  Location: [facility];  Service: Cardiovascular;  Laterality: N/A;   TOTAL KNEE ARTHROPLASTY Left 10/27/2020   Procedure: LEFT TOTAL KNEE ARTHROPLASTY;  Surgeon: Tarry Kos, MD;  Location: [facility];  Service: Orthopedics;  Laterality: Left;   TUBAL LIGATION     UPPER GASTROINTESTINAL ENDOSCOPY     Patient Active Problem List   Diagnosis Date Noted   Coronary artery disease of native artery of native heart with stable angina pectoris (HCC)    Other constipation 05/25/2021   Class 2 severe obesity with serious comorbidity and body mass index (BMI) of 35.0 to 35.9 in adult (HCC) 12/01/2020   Status post total left knee replacement 10/27/2020    Primary osteoarthritis of left knee    NAFLD (nonalcoholic fatty liver disease) 73/75/0510   Other fatigue 07/23/2020   SOBOE (shortness of breath on exertion) 07/23/2020   Diabetes mellitus (HCC) 07/23/2020   Hypertension associated with type 2 diabetes mellitus (HCC) 07/23/2020   Hyperlipidemia associated with type 2 diabetes mellitus (HCC) 07/23/2020   Absolute anemia 07/23/2020   Vitamin D deficiency 07/23/2020   At risk for heart disease 07/23/2020   AKI (acute kidney injury) (HCC) 09/07/2019   Acute kidney injury (HCC) 09/06/2019   Bradycardia 09/06/2019   Hyperglycemia 09/06/2019   HLD (hyperlipidemia)    Hypertensive nephropathy 05/16/2019   Erosive gastritis 11/16/2017   Fatty liver 11/16/2017   Abdominal pain 12/11/2012   Early satiety 12/11/2012   Hiatal hernia    Hx of cholecystectomy 02/05/2011   Flu-like symptoms 02/05/2011   CONSTIPATION 08/27/2008   FATTY LIVER DISEASE 05/07/2008   Nausea with vomiting 04/23/2008   EPIGASTRIC PAIN 04/23/2008   RENAL CALCULUS 04/22/2008   Type 2 diabetes mellitus with stage 2 chronic kidney disease, with long-term current use of insulin (HCC) 05/19/2006   Type 2 diabetes mellitus with hyperlipidemia (HCC) 05/19/2006   HYPERTENSION, BENIGN SYSTEMIC 05/19/2006   GASTROESOPHAGEAL REFLUX, NO ESOPHAGITIS 05/19/2006  AMENORRHEA 05/19/2006    REFERRING DIAG: K59.935 (ICD-10-CM) - Status post total left knee replacement  THERAPY DIAG:  Chronic pain of left knee  Muscle weakness (generalized)  Difficulty in walking, not elsewhere classified  Localized edema  PERTINENT HISTORY: DM, HTN  PRECAUTIONS: None  SUBJECTIVE:  Pt indicated symptoms worsen c being on feet too long.    PAIN:  NPRS scale: pain range 5-7/10 Pain location: Lt knee Pain description: aching, tightness Aggravating factors: days after gym Relieving factors: tylenol   OBJECTIVE: (objective measures completed at initial evaluation unless otherwise  dated)   PATIENT SURVEYS:  08/05/2021:   update 65%  07/07/2021: FOTO intake: 52  predicted: 63              PALPATION:  07/07/2021 : Mild tenderness to touch anterior knee joint, inferior patella Lt   LE ROM:   Active ROM Right 07/07/2021 Left 07/07/2021 Left 07/29/2021  Hip flexion       Hip extension       Hip abduction       Hip adduction       Hip internal rotation       Hip external rotation       Knee flexion   108 AROM supine heel slide c pain anterior knee 110 AROM in supine heel slide c pain noted  Knee extension   -5 in seated LAQ AROM   0 PROM in supine heel prop   Ankle dorsiflexion       Ankle plantarflexion       Ankle inversion       Ankle eversion        (Blank rows = not tested)   LE MMT:   MMT Right 07/07/2021 Left 07/07/2021 Right 07/29/2021 Left 07/29/2021 Right 08/27/2021 Left 08/27/2021  Hip flexion 5/5 5/5      Hip extension          Hip abduction          Hip adduction          Hip internal rotation          Hip external rotation          Knee flexion 5/5 4/5      Knee extension 4/5 21.5, 18.5 lbs 4/5 19.1, 17.3 lbs c pain 4+/5 33, 32.3 lbs 4/5 25.6, 23.3 lbs 5/5 38, 43.6 lbs 5/5 33.6, 30.8 lbs  Ankle dorsiflexion 5/5 5/5      Ankle plantarflexion          Ankle inversion          Ankle eversion           (Blank rows = not tested)     FUNCTIONAL TESTS:   08/12/2021:  Lt SLS: 7 seconds Rt SLS: 8 seconds  07/29/2021:  TUG : independent 10.73 seconds  07/07/2021         TUG: independent 15.95 seconds             Lt SLS:  3 seconds   Rt SLS: 10 seconds             18 inch transfer s UE assist: on second try   GAIT: 07/07/2021 Distance walked: Household distances within clinic  Assistive device utilized: Independent  Comments: Mild maintained knee flexion in stance Lt leg, mild reduction of stance on Lt leg.        TODAY'S TREATMENT: 08/27/2021 Therex:     Recumbent bike lvl 3 8 mins, Seat 5  Seated SLR bilateral x 15 slow  eccentric lowering focus 1 lb  Neuro re-ed  SLS c vector reach light touch contralateral leg fwd/side/back x 8 bilateral  Lateral stepping 3 cones x 8 bilateral  TherActivity (performed to improve functional movements including steps)  Fwd step up 6 x 15 bilateral  Lateral step down 4 inch 2 x 10 slow eccentric lowering   Leg press double leg 68 lbs 2 x 10, single leg 2 x15 43 lbs very slow rep speed, performed bilateral  08/05/2021 Therex:     Recumbent bike lvl 2 6 mins, Seat 4     Incline board gastroc stretch bilateral 1 min x 3 Step up fwd 4 inch 2 x 10 Lt leg (6 inch was too high - compensatory movements noted) Standing blue band TKE hold 5 sec x 15 Leg press double leg 62 lbs 2 x 15, single leg x15 43 lbs very slow rep speed, performed bilateral Seated SLR 2 x 10 bilateral  Neuro Re-ed  Standing church pew weight shift fwd/back ankle strategy 3 mins on foam  Tandem stance 1 min x 1 bilateral on foam     07/29/2021 Therex:     NuStep L6 x 10 min     Sit to stand  18 inch table 10 reps no UE assist  Supine bridge 3 sec hold 2 x 10 Runner stretch at wall c cues for pushing heel into ground 15 sec x 5 bilateral Leg press double leg 62 lbs x 10, single leg 2 x 10 37 lbs, performed bilateral  Neuro Re-ed  Standing church pew weight shift fwd/back ankle strategy x20  Tandem stance 1 min x 1 bilateral  Retro step x 15 bilateral    Manual  Supine Lt knee g3 IR/ER proximal tibia mobs, Distraction c flexion/IR mobilization c movement Lt knee   PATIENT EDUCATION:  07/07/2021 Education details: HEP, POC Person educated: Patient Education method: Consulting civil engineer, Demonstration, Verbal cues, and Handouts Education comprehension: verbalized understanding, returned demonstration, and verbal cues required     HOME EXERCISE PROGRAM: 07/07/2021 Access Code: [facility] URL: [email or web address] Date: 07/07/2021 Prepared by: Scot Jun   Exercises - Supine  Quadricep Sets  - 3-5 x daily - 7 x weekly - 1 sets - 10 reps - 5 hold - Supine Heel Slide (Mirrored)  - 3-5 x daily - 7 x weekly - 1 sets - 10 reps - 2 hold - Seated Quad Set (Mirrored)  - 3-5 x daily - 7 x weekly - 1 sets - 10 reps - 5 hold - Seated Long Arc Quad (Mirrored)  - 3-5 x daily - 7 x weekly - 1 sets - 5-10 reps - 2 hold - Seated Straight Leg Heel Taps  - 1-2 x daily - 7 x weekly - 1-2 sets - 5-10 reps - Sit to Stand  - 1-2 x daily - 7 x weekly - 1-2 sets - 10 reps   ASSESSMENT:   CLINICAL IMPRESSION: After discussion c Pt, Pt indicated desire to continue to progress in clinic through this month to continue to address strength and pain symptoms in WB activity.  Strength improved again compared to previous assessment.     OBJECTIVE IMPAIRMENTS Abnormal gait, decreased activity tolerance, decreased balance, decreased coordination, decreased endurance, decreased mobility, difficulty walking, decreased ROM, decreased strength, hypomobility, increased edema, impaired perceived functional ability, impaired flexibility, improper body mechanics, and pain.    ACTIVITY LIMITATIONS cleaning, community activity, meal prep, laundry, and shopping.  PERSONAL FACTORS  HTN, hyperlipidemia, GERD, DM, RA  are also affecting patient's functional outcome.      REHAB POTENTIAL: Fair to good   CLINICAL DECISION MAKING: Stable/uncomplicated   EVALUATION COMPLEXITY: Low     GOALS: Goals reviewed with patient? Yes Eval: 07/07/2021  Short term PT Goals (target date for Short term goals are 3 weeks 07/28/2021) Patient will demonstrate independent use of home exercise program to maintain progress from in clinic treatments. Goal status: MET - assessed 07/29/2021   Long term PT goals (target dates for all long term goals are 10 weeks  09/15/2021 )   1. Patient will demonstrate/report pain at worst less than or equal to 2/10 to facilitate minimal limitation in daily activity secondary to pain  symptoms. Goal status: on going - assessed 08/27/2021   2. Patient will demonstrate independent use of home exercise program to facilitate ability to maintain/progress functional gains from skilled physical therapy services. Goal status: on going - assessed 08/27/2021   3. Patient will demonstrate FOTO outcome > or = 63 % to indicate reduced disability due to condition. Goal status: on going - assessed 08/27/2021   4.  Patient will demonstrate Lt knee AROM 0-110 degrees s symptoms to facilitate ability to perform transfers, sitting, ambulation, stair navigation s restriction due to mobility. Goal status: on going - assessed 08/27/2021   5.  Patient will demonstrate bilateral LE MMT 5/5, dynamometry improvement of > = 10 lbs to facilitate ability to perform usual standing, walking, stairs at PLOF s limitation due to symptoms.   Goal status: on going - assessed 08/27/2021   6.  Patient will demonstrate/report ability to ascend/descend stairs c reciprocal gait pattern for household entry.   Goal status: on going - assessed 08/27/2021   7.  Patient will demonstrate/report ability to return to gym workout routine.  a.  Goal Status: MET - assessed 08/27/2021     PLAN: PT FREQUENCY: 1-2x/week   PT DURATION: 10 weeks   PLANNED INTERVENTIONS:  Therapeutic exercises, Therapeutic activity, Neuro Muscular re-education, Balance training, Gait training, Patient/Family education, Joint mobilization, Stair training, DME instructions, Dry Needling, Electrical stimulation, Cryotherapy, Moist heat, Taping, Ultrasound, Ionotophoresis 4mg /ml Dexamethasone, and Manual therapy.  All included unless contraindicated   PLAN FOR NEXT SESSION:   FOTO update   Scot Jun, PT, DPT, OCS, ATC 08/27/21  4:30 PM

## 2021-08-31 ENCOUNTER — Ambulatory Visit: Payer: Medicare Other | Admitting: Internal Medicine

## 2021-09-02 ENCOUNTER — Encounter: Payer: Self-pay | Admitting: Rehabilitative and Restorative Service Providers"

## 2021-09-02 ENCOUNTER — Ambulatory Visit (INDEPENDENT_AMBULATORY_CARE_PROVIDER_SITE_OTHER): Payer: Medicare Other | Admitting: Rehabilitative and Restorative Service Providers"

## 2021-09-02 DIAGNOSIS — M25562 Pain in left knee: Secondary | ICD-10-CM

## 2021-09-02 DIAGNOSIS — G8929 Other chronic pain: Secondary | ICD-10-CM

## 2021-09-02 DIAGNOSIS — M6281 Muscle weakness (generalized): Secondary | ICD-10-CM | POA: Diagnosis not present

## 2021-09-02 DIAGNOSIS — R600 Localized edema: Secondary | ICD-10-CM

## 2021-09-02 DIAGNOSIS — R262 Difficulty in walking, not elsewhere classified: Secondary | ICD-10-CM | POA: Diagnosis not present

## 2021-09-02 NOTE — Therapy (Signed)
OUTPATIENT PHYSICAL THERAPY TREATMENT NOTE   Patient Name: Leslie Davis MRN: [id number] DOB:1955/05/15, 66 y.o., female Today's Date: 09/02/2021  PCP: [name], MD REFERRING PROVIDER: Aundra Dubin, PA-C  END OF SESSION:   PT End of Session - 09/02/21 0939     Visit Number 7    Number of Visits 20    Date for PT Re-Evaluation 09/15/21    Authorization Type UHC Medicare 20% Coinsurance    Progress Note Due on Visit 10    PT Start Time 0936    PT Stop Time 1015    PT Time Calculation (min) 39 min    Activity Tolerance Patient tolerated treatment well    Behavior During Therapy WFL for tasks assessed/performed                  Past Medical History:  Diagnosis Date   DM (diabetes mellitus) ([place])    Fatty liver disease, nonalcoholic    Gastritis    Gastroparesis    GERD (gastroesophageal reflux disease)    Heartburn    Hiatal hernia    HLD (hyperlipidemia)    HTN (hypertension)    Joint pain    Renal calculus    Rheumatoid arthritis ([place])    Vitamin D deficiency    Past Surgical History:  Procedure Laterality Date   CHOLECYSTECTOMY     COLONOSCOPY     LEFT HEART CATH AND CORONARY ANGIOGRAPHY N/A 08/18/2021   Procedure: LEFT HEART CATH AND CORONARY ANGIOGRAPHY;  Surgeon: Nigel Mormon, MD;  Location: [place] [facility];  Service: Cardiovascular;  Laterality: N/A;   TOTAL KNEE ARTHROPLASTY Left 10/27/2020   Procedure: LEFT TOTAL KNEE ARTHROPLASTY;  Surgeon: [name], MD;  Location: [place];  Service: Orthopedics;  Laterality: Left;   TUBAL LIGATION     UPPER GASTROINTESTINAL ENDOSCOPY     Patient Active Problem List   Diagnosis Date Noted   Coronary artery disease of native artery of native heart with stable angina pectoris ([place])    Other constipation 05/25/2021   Class 2 severe obesity with serious comorbidity and body mass index (BMI) of 35.0 to 35.9 in adult ([place]) 12/01/2020   Status post total left knee replacement 10/27/2020    Primary osteoarthritis of left knee    NAFLD (nonalcoholic fatty liver disease) 08/06/2020   Other fatigue 07/23/2020   SOBOE (shortness of breath on exertion) 07/23/2020   Diabetes mellitus ([place]) 07/23/2020   Hypertension associated with type 2 diabetes mellitus ([place]) 07/23/2020   Hyperlipidemia associated with type 2 diabetes mellitus ([place]) 07/23/2020   Absolute anemia 07/23/2020   Vitamin D deficiency 07/23/2020   At risk for heart disease 07/23/2020   AKI (acute kidney injury) ([place]) 09/07/2019   Acute kidney injury ([place]) 09/06/2019   Bradycardia 09/06/2019   Hyperglycemia 09/06/2019   HLD (hyperlipidemia)    Hypertensive nephropathy 05/16/2019   Erosive gastritis 11/16/2017   Fatty liver 11/16/2017   Abdominal pain 12/11/2012   Early satiety 12/11/2012   Hiatal hernia    Hx of cholecystectomy 02/05/2011   Flu-like symptoms 02/05/2011   CONSTIPATION 08/27/2008   FATTY LIVER DISEASE 05/07/2008   Nausea with vomiting 04/23/2008   EPIGASTRIC PAIN 04/23/2008   RENAL CALCULUS 04/22/2008   Type 2 diabetes mellitus with stage 2 chronic kidney disease, with long-term current use of insulin ([place]) 05/19/2006   Type 2 diabetes mellitus with hyperlipidemia ([place]) 05/19/2006   HYPERTENSION, BENIGN SYSTEMIC 05/19/2006   GASTROESOPHAGEAL REFLUX, NO ESOPHAGITIS 05/19/2006  AMENORRHEA 05/19/2006    REFERRING DIAG: M19.622 (ICD-10-CM) - Status post total left knee replacement  THERAPY DIAG:  Chronic pain of left knee  Muscle weakness (generalized)  Difficulty in walking, not elsewhere classified  Localized edema  PERTINENT HISTORY: DM, HTN  PRECAUTIONS: None  SUBJECTIVE:  Insidious onset of achy feeling reported.     PAIN:  NPRS scale: pain range 6/10 Pain location: Lt knee Pain description: aching, tightness Aggravating factors: days after gym Relieving factors: tylenol   OBJECTIVE: (objective measures completed at initial evaluation unless otherwise dated)   PATIENT  SURVEYS:  09/02/2021:  update:  56 %  08/05/2021:   update 65%  07/07/2021: FOTO intake: 52  predicted: 63              PALPATION:  07/07/2021 : Mild tenderness to touch anterior knee joint, inferior patella Lt   LE ROM:   Active ROM Right 07/07/2021 Left 07/07/2021 Left 07/29/2021  Hip flexion       Hip extension       Hip abduction       Hip adduction       Hip internal rotation       Hip external rotation       Knee flexion   108 AROM supine heel slide c pain anterior knee 110 AROM in supine heel slide c pain noted  Knee extension   -5 in seated LAQ AROM   0 PROM in supine heel prop   Ankle dorsiflexion       Ankle plantarflexion       Ankle inversion       Ankle eversion        (Blank rows = not tested)   LE MMT:   MMT Right 07/07/2021 Left 07/07/2021 Right 07/29/2021 Left 07/29/2021 Right 08/27/2021 Left 08/27/2021  Hip flexion 5/5 5/5      Hip extension          Hip abduction          Hip adduction          Hip internal rotation          Hip external rotation          Knee flexion 5/5 4/5      Knee extension 4/5 21.5, 18.5 lbs 4/5 19.1, 17.3 lbs c pain 4+/5 33, 32.3 lbs 4/5 25.6, 23.3 lbs 5/5 38, 43.6 lbs 5/5 33.6, 30.8 lbs  Ankle dorsiflexion 5/5 5/5      Ankle plantarflexion          Ankle inversion          Ankle eversion           (Blank rows = not tested)     FUNCTIONAL TESTS:   08/12/2021:  Lt SLS: 7 seconds Rt SLS: 8 seconds  07/29/2021:  TUG : independent 10.73 seconds  07/07/2021         TUG: independent 15.95 seconds             Lt SLS:  3 seconds   Rt SLS: 10 seconds             18 inch transfer s UE assist: on second try   GAIT: 07/07/2021 Distance walked: Household distances within clinic  Assistive device utilized: Independent  Comments: Mild maintained knee flexion in stance Lt leg, mild reduction of stance on Lt leg.        TODAY'S TREATMENT: 09/02/2021 Therex:     Recumbent bike lvl 3  10 mins seat 6     Seated SLR bilateral 2 x  10 1.5 lbs  TherActivity (performed to improve functional movements including steps)  Fwd step up 6 x 15 bilateral  Lateral step down 4 inch 2 x 10 slow eccentric lowering   Leg press double leg 68 lbs x 10, single leg 2 x15 43 lbs very slow rep speed, performed bilateral  Sit to stand slow lowering focus x 10 18 inch chair   08/27/2021 Therex:     Recumbent bike lvl 3 10 mins seat 6     Seated SLR bilateral x 15 slow eccentric lowering focus 1 lb  Neuro re-ed  SLS c vector reach light touch contralateral leg fwd/side/back x 8 bilateral  Lateral stepping 3 cones x 8 bilateral  TherActivity (performed to improve functional movements including steps)  Fwd step up 6 x 15 bilateral  Lateral step down 4 inch 2 x 10 slow eccentric lowering   Leg press double leg 68 lbs 2 x 10, single leg 2 x15 43 lbs very slow rep speed, performed bilateral  08/05/2021 Therex:     Recumbent bike lvl 2 6 mins, Seat 4     Incline board gastroc stretch bilateral 1 min x 3 Step up fwd 4 inch 2 x 10 Lt leg (6 inch was too high - compensatory movements noted) Standing blue band TKE hold 5 sec x 15 Leg press double leg 62 lbs 2 x 15, single leg x15 43 lbs very slow rep speed, performed bilateral Seated SLR 2 x 10 bilateral  Neuro Re-ed  Standing church pew weight shift fwd/back ankle strategy 3 mins on foam  Tandem stance 1 min x 1 bilateral on foam     PATIENT EDUCATION:  07/07/2021 Education details: HEP, POC Person educated: Patient Education method: Consulting civil engineer, Demonstration, Verbal cues, and Handouts Education comprehension: verbalized understanding, returned demonstration, and verbal cues required     HOME EXERCISE PROGRAM: 07/07/2021 Access Code: [facility] URL: [email or web address] Date: 07/07/2021 Prepared by: Scot Jun   Exercises - Supine Quadricep Sets  - 3-5 x daily - 7 x weekly - 1 sets - 10 reps - 5 hold - Supine Heel Slide (Mirrored)  - 3-5 x daily - 7 x weekly -  1 sets - 10 reps - 2 hold - Seated Quad Set (Mirrored)  - 3-5 x daily - 7 x weekly - 1 sets - 10 reps - 5 hold - Seated Long Arc Quad (Mirrored)  - 3-5 x daily - 7 x weekly - 1 sets - 5-10 reps - 2 hold - Seated Straight Leg Heel Taps  - 1-2 x daily - 7 x weekly - 1-2 sets - 5-10 reps - Sit to Stand  - 1-2 x daily - 7 x weekly - 1-2 sets - 10 reps   ASSESSMENT:   CLINICAL IMPRESSION: FOTO reassessment showed decrease in score with difficulty increased one level for walking a mile and in home activity heavy.  Despite reassessment result on FOTO, Pt continued to show improved stair navigation control c reduced symptoms as observed within clinic activity.  Continued focus on improved strength for functional activity indicated.    OBJECTIVE IMPAIRMENTS Abnormal gait, decreased activity tolerance, decreased balance, decreased coordination, decreased endurance, decreased mobility, difficulty walking, decreased ROM, decreased strength, hypomobility, increased edema, impaired perceived functional ability, impaired flexibility, improper body mechanics, and pain.    ACTIVITY LIMITATIONS cleaning, community activity, meal prep, laundry, and shopping.    PERSONAL FACTORS  HTN,  hyperlipidemia, GERD, DM, RA  are also affecting patient's functional outcome.      REHAB POTENTIAL: Fair to good   CLINICAL DECISION MAKING: Stable/uncomplicated   EVALUATION COMPLEXITY: Low     GOALS: Goals reviewed with patient? Yes Eval: 07/07/2021  Short term PT Goals (target date for Short term goals are 3 weeks 07/28/2021) Patient will demonstrate independent use of home exercise program to maintain progress from in clinic treatments. Goal status: MET - assessed 07/29/2021   Long term PT goals (target dates for all long term goals are 10 weeks  09/15/2021 )   1. Patient will demonstrate/report pain at worst less than or equal to 2/10 to facilitate minimal limitation in daily activity secondary to pain symptoms. Goal  status: on going - assessed 08/27/2021   2. Patient will demonstrate independent use of home exercise program to facilitate ability to maintain/progress functional gains from skilled physical therapy services. Goal status: on going - assessed 08/27/2021   3. Patient will demonstrate FOTO outcome > or = 63 % to indicate reduced disability due to condition. Goal status: on going - assessed 08/27/2021   4.  Patient will demonstrate Lt knee AROM 0-110 degrees s symptoms to facilitate ability to perform transfers, sitting, ambulation, stair navigation s restriction due to mobility. Goal status: on going - assessed 08/27/2021   5.  Patient will demonstrate bilateral LE MMT 5/5, dynamometry improvement of > = 10 lbs to facilitate ability to perform usual standing, walking, stairs at PLOF s limitation due to symptoms.   Goal status: on going - assessed 08/27/2021   6.  Patient will demonstrate/report ability to ascend/descend stairs c reciprocal gait pattern for household entry.   Goal status: on going - assessed 08/27/2021   7.  Patient will demonstrate/report ability to return to gym workout routine.  a.  Goal Status: MET - assessed 08/27/2021     PLAN: PT FREQUENCY: 1-2x/week   PT DURATION: 10 weeks   PLANNED INTERVENTIONS:  Therapeutic exercises, Therapeutic activity, Neuro Muscular re-education, Balance training, Gait training, Patient/Family education, Joint mobilization, Stair training, DME instructions, Dry Needling, Electrical stimulation, Cryotherapy, Moist heat, Taping, Ultrasound, Ionotophoresis 25m/ml Dexamethasone, and Manual therapy.  All included unless contraindicated   PLAN FOR NEXT SESSION:  Continue progressive strengthening with functional WB improvements.    MScot Jun PT, DPT, OCS, ATC 09/02/21  10:12 AM

## 2021-09-07 ENCOUNTER — Ambulatory Visit: Payer: Medicare Other | Admitting: Cardiology

## 2021-09-07 ENCOUNTER — Ambulatory Visit (INDEPENDENT_AMBULATORY_CARE_PROVIDER_SITE_OTHER): Payer: Medicare Other | Admitting: Family Medicine

## 2021-09-07 ENCOUNTER — Encounter: Payer: Self-pay | Admitting: Cardiology

## 2021-09-07 VITALS — BP 163/65 | HR 50 | Temp 97.6°F | Resp 16 | Ht 64.0 in | Wt 169.6 lb

## 2021-09-07 DIAGNOSIS — E1169 Type 2 diabetes mellitus with other specified complication: Secondary | ICD-10-CM | POA: Diagnosis not present

## 2021-09-07 DIAGNOSIS — I10 Essential (primary) hypertension: Secondary | ICD-10-CM | POA: Diagnosis not present

## 2021-09-07 DIAGNOSIS — I2510 Atherosclerotic heart disease of native coronary artery without angina pectoris: Secondary | ICD-10-CM

## 2021-09-07 DIAGNOSIS — E785 Hyperlipidemia, unspecified: Secondary | ICD-10-CM | POA: Diagnosis not present

## 2021-09-07 DIAGNOSIS — E1122 Type 2 diabetes mellitus with diabetic chronic kidney disease: Secondary | ICD-10-CM | POA: Diagnosis not present

## 2021-09-07 DIAGNOSIS — I700 Atherosclerosis of aorta: Secondary | ICD-10-CM | POA: Diagnosis not present

## 2021-09-07 DIAGNOSIS — I2584 Coronary atherosclerosis due to calcified coronary lesion: Secondary | ICD-10-CM | POA: Diagnosis not present

## 2021-09-07 DIAGNOSIS — N1831 Chronic kidney disease, stage 3a: Secondary | ICD-10-CM | POA: Diagnosis not present

## 2021-09-07 MED ORDER — ISOSORB DINITRATE-HYDRALAZINE 20-37.5 MG PO TABS: 1.0000 | ORAL_TABLET | Freq: Three times a day (TID) | ORAL | 0 refills | Status: DC

## 2021-09-07 NOTE — Progress Notes (Unsigned)
ID:  Leslie Davis, DOB [birth year], MRN [id number]  PCP:  [name], MD  Cardiologist:  Rex Kras, DO, [facility] (established care July 09, 2021) Former Cardiology Providers: Dr. Minus Breeding.   Date: 09/07/21 Last Office Visit: 08/03/2021  Chief Complaint  Patient presents with   Post catheterization    HPI  Leslie Davis is a 66 y.o. African-American female whose past medical history and cardiovascular risk factors include: Coronary artery calcification, hypertension, hyperlipidemia, GERD, fatty liver disease, IDDM Type II, nephrolithiasis, advanced age.  Patient was referred to the practice after being evaluated at [facility] long ED for shortness of breath/chest tightness.  Her characteristics of chest discomfort appears to be likely cardiac and given her other risk factors such as insulin-dependent diabetes, CAC, hyperlipidemia and hypertension patient was recommended to undergo an echo and coronary CTA.  She is asked to come in sooner than her scheduled appointment to discuss test results.  Since last office visit patient states that her precordial discomfort is improved with up titration of medical therapy but at times still services.  Results of the coronary CTA discussed with her in great detail including images findings noted below for further reference.  Home blood pressures are well controlled around 409 mmHg and diastolic blood pressures are between 60-70 mmHg.  She continues to have reduced physical endurance compared to the past.  No CP or SOB BP high forgot this morning  140-145 home.  Walking 1 mile 2-3x per week and biking 12mn 2-3x / week.  No nitro  PCP next week.   ALLERGIES: Allergies  Allergen Reactions   Fiasp [Insulin Aspart (W-Niacinamide)] Hives    MEDICATION LIST PRIOR TO VISIT: Current Meds  Medication Sig   acetaminophen-codeine (TYLENOL #3) 300-30 MG tablet Take 1 tablet by mouth daily as needed for moderate pain.   amLODipine  (NORVASC) 10 MG tablet TAKE ONE TABLET BY MOUTH ONCE DAILY   aspirin EC 81 MG tablet Take 1 tablet (81 mg total) by mouth 2 (two) times daily. To be taken after surgery (Patient taking differently: Take 81 mg by mouth daily.)   Azilsartan Medoxomil (EDARBI) 80 MG TABS TAKE ONE TABLET BY MOUTH ONCE DAILY   Blood Glucose Monitoring Suppl (ONETOUCH VERIO) w/Device KIT Use as directed to check blood sugars 2 times per day dx: e11.65   celecoxib (CELEBREX) 200 MG capsule Take 200 mg by mouth 2 (two) times daily.   cetirizine (ZYRTEC) 10 MG tablet Take 1 tablet (10 mg total) by mouth daily.   dapagliflozin propanediol (FARXIGA) 10 MG TABS tablet Take 1 tablet (10 mg total) by mouth daily before breakfast.   ferrous gluconate (FERGON) 324 MG tablet TAKE 1 TABLET BY MOUTH TWICE DAILY WITH A MEAL. (Patient taking differently: Take 324 mg by mouth 2 (two) times daily with a meal.)   glucose blood (ONETOUCH VERIO) test strip Use as instructed to check blood sugars 2 times per day dx:e11.65   insulin lispro (HUMALOG KWIKPEN) 100 UNIT/ML KwikPen Sliding scale (Patient taking differently: Inject 4-12 Units into the skin 3 (three) times daily as needed (High blood sugar). Sliding scale)   linaclotide ([name]) 145 MCG CAPS capsule Take 1 capsule (145 mcg total) by mouth daily. (Patient taking differently: Take 145 mcg by mouth daily as needed (constipation).)   metoprolol succinate (TOPROL XL) 25 MG 24 hr tablet Take 1 tablet (25 mg total) by mouth daily.   nitroGLYCERIN (NITROSTAT) 0.4 MG SL tablet Place 1 tablet (0.4 mg total) under  the tongue every 5 (five) minutes as needed for chest pain. If you require more than two tablets five minutes apart go to the nearest ER via EMS.   OneTouch Delica Lancets 96Q MISC Use as directed to check blood sugars 2 times per day dx:e11.65   pantoprazole (PROTONIX) 40 MG tablet Take one tablet by mouth 3 days a week (Patient taking differently: Take 40 mg by mouth every morning.)    rosuvastatin (CRESTOR) 20 MG tablet TAKE ONE TABLET BY MOUTH ONCE DAILY   Semaglutide, 2 MG/DOSE, 8 MG/3ML SOPN Inject 2 mg as directed once a week.   temazepam (RESTORIL) 30 MG capsule TAKE ONE CAPSULE BY MOUTH EVERYDAY AT BEDTIME AS NEEDED   TRUEPLUS PEN NEEDLES 31G X 6 MM MISC USE WITH pen TO INJECT insulin DAILY     PAST MEDICAL HISTORY: Past Medical History:  Diagnosis Date   DM (diabetes mellitus) ([place])    Fatty liver disease, nonalcoholic    Gastritis    Gastroparesis    GERD (gastroesophageal reflux disease)    Heartburn    Hiatal hernia    HLD (hyperlipidemia)    HTN (hypertension)    Joint pain    Renal calculus    Rheumatoid arthritis ([place])    Vitamin D deficiency     PAST SURGICAL HISTORY: Past Surgical History:  Procedure Laterality Date   CHOLECYSTECTOMY     COLONOSCOPY     LEFT HEART CATH AND CORONARY ANGIOGRAPHY N/A 08/18/2021   Procedure: LEFT HEART CATH AND CORONARY ANGIOGRAPHY;  Surgeon: Nigel Mormon, MD;  Location: [place] [facility];  Service: Cardiovascular;  Laterality: N/A;   TOTAL KNEE ARTHROPLASTY Left 10/27/2020   Procedure: LEFT TOTAL KNEE ARTHROPLASTY;  Surgeon: [name], MD;  Location: Center;  Service: Orthopedics;  Laterality: Left;   TUBAL LIGATION     UPPER GASTROINTESTINAL ENDOSCOPY      FAMILY HISTORY: The patient family history includes COPD in her sister; Diabetes in her brother, maternal grandmother, mother, and sister; Heart disease in her sister; Heart disease (age of onset: 30) in her brother; Hyperlipidemia in her father; Hypertension in her father; Kidney disease in her mother and sister; Stomach cancer in her maternal aunt; Stroke in her father; Sudden death in her father; Thyroid disease in her mother.  SOCIAL HISTORY:  The patient  reports that she quit smoking about 47 years ago. Her smoking use included cigarettes. She has a 0.25 pack-year smoking history. She has never used smokeless tobacco. She reports that she  does not drink alcohol and does not use drugs.  REVIEW OF SYSTEMS: Review of Systems  Cardiovascular:  Negative for chest pain, dyspnea on exertion, leg swelling, near-syncope, orthopnea, palpitations, paroxysmal nocturnal dyspnea and syncope.  Respiratory:  Negative for shortness of breath.     PHYSICAL EXAM:    09/07/2021    1:27 PM 09/07/2021    1:26 PM 08/18/2021    5:36 PM  Vitals with BMI  Height  _0    Weight  169 lbs 10 oz   BMI  22.9   Systolic 798 921 194  Diastolic 65 79 56  Pulse 50 48 54    CONSTITUTIONAL: Well-developed and well-nourished. No acute distress.  SKIN: Skin is warm and dry. No rash noted. No cyanosis. No pallor. No jaundice HEAD: Normocephalic and atraumatic.  EYES: No scleral icterus MOUTH/THROAT: Moist oral membranes.  NECK: No JVD present. No thyromegaly noted. No carotid bruits  LYMPHATIC: No visible cervical adenopathy.  CHEST Normal respiratory effort. No intercostal retractions  LUNGS: Clear to auscultation bilaterally.  No stridor. No wheezes. No rales.  CARDIOVASCULAR: Regular rate and rhythm, positive S1-S2, no murmurs rubs or gallops appreciated. ABDOMINAL: Obese, soft, nontender, nondistended, positive bowel sounds all 4 quadrants. No apparent ascites.  EXTREMITIES: No peripheral edema, warm to touch, +2 bilateral DP and PT pulses HEMATOLOGIC: No significant bruising NEUROLOGIC: Oriented to person, place, and time. Nonfocal. Normal muscle tone.  PSYCHIATRIC: Normal mood and affect. Normal behavior. Cooperative  RADIOLOGY CTA PE protocol: June 25, 2021 1. No pulmonary embolus or acute intrathoracic abnormality. 2. Coronary artery calcifications. Aortic Atherosclerosis (ICD10-I70.0)  CARDIAC DATABASE: EKG: 07/09/2021: Normal sinus rhythm, 60 bpm, old anteroseptal infarct, TWI in lateral leads consider possible ischemia.   Echocardiogram: 07/15/2021:  Normal LV systolic function with EF 63%. Left ventricle cavity is normal  in  size. Mild concentric remodeling of the left ventricle. Normal global  wall motion. Normal diastolic filling pattern. Calculated EF 63%.  Left atrial cavity is mildly dilated by volume.  Structurally normal tricuspid valve.  Mild tricuspid regurgitation. No  evidence of pulmonary hypertension.   Stress Testing: 11/03/2018 [facility] available in Care Everywhere: Per report: No reversible ischemia or infarction, normal wall motion, calculated LVEF 84%, low risk study  CCTA: 07/22/2021 1. Total coronary calcium score of 341. This was 93rd percentile for age and sex matched control. 2. Normal coronary origin with co-dominance. 3. CAD-RADS = 3.  Left Main: Patent.   LAD: Mild stenosis (25-49%) at the proximal LAD due to calcified plaque. Moderate stenosis (50-69%) at mid LAD after the take off of second diagonal branch due to mixed eccentric plaque (lesion is about 2 cm in length). Mild stenosis (25-49%) due to calcified plaque within the distal and apical LAD. First Diagonal branch is patent. Second diagonal branch patent with calcified plaque at the mid-segment.   LCx: Patent.   RCA: Moderate stenosis (50-69%) at the ostial RCA due to eccentric mixed plaque and remainder of the vessel is patent.   4. Aortic atherosclerosis.   5. Study is sent for CT-FFR to further evaluate the RCA and LAD disease. Findings will be performed and reported separately.  6. No acute or significant incidental extracardiac findings in the chest.  CTFFR  May 2023: CT FFR analysis illustrates hemodynamically significant stenosis within the mid LAD (distal to second diagonal branch).  Heart Catheterization: 08/18/2021: LM: Normal LAD: Prox calcific 30% disease         Mid, focal, eccentric 70% stenosis Lcx: No significant disease RCA: Ostial 40% stenosis Patient opted medical therapy. In absence of any angina or angina equivalent symptoms on excellent medical therapy,  reasonable to continue the same at that time.  In future, if symptoms increase, could consider PCI to mid LAD.   LABORATORY DATA:    Latest Ref Rng & Units 08/11/2021    9:33 AM 06/25/2021    1:50 PM 12/23/2020    3:22 PM  CBC  WBC 3.4 - 10.8 x10E3/uL 3.4  4.5  5.1   Hemoglobin 11.1 - 15.9 g/dL 13.5  13.6  11.2   Hematocrit 34.0 - 46.6 % 40.8  40.9  34.9   Platelets 150 - 450 x10E3/uL 261  245  256.0        Latest Ref Rng & Units 08/11/2021    9:33 AM 06/25/2021    1:50 PM 05/28/2021   12:52 PM  CMP  Glucose 70 - 99 mg/dL  101  93  79   BUN 8 - 27 mg/dL 24  34  20   Creatinine 0.57 - 1.00 mg/dL 1.16  1.16  1.08   Sodium 134 - 144 mmol/L 139  138  141   Potassium 3.5 - 5.2 mmol/L 4.0  4.3  4.9   Chloride 96 - 106 mmol/L 102  107  104   CO2 20 - 29 mmol/L _0 Calcium 8.7 - 10.3 mg/dL 9.9  9.5  10.0   Total Protein 6.5 - 8.1 g/dL  7.9  7.2   Total Bilirubin 0.3 - 1.2 mg/dL  0.3  0.2   Alkaline Phos 38 - 126 U/L  94  128   AST 15 - 41 U/L  47  24   ALT 0 - 44 U/L  37  30     Lipid Panel  Lab Results  Component Value Date   CHOL 156 11/20/2020   HDL 61 11/20/2020   LDLCALC 78 11/20/2020   TRIG 89 11/20/2020   CHOLHDL 2.6 11/20/2020     No components found for: "NTPROBNP" No results for input(s): "PROBNP" in the last 8760 hours. Recent Labs    06/17/21 1606  TSH 1.640     BMP Recent Labs    10/22/20 1430 10/28/20 0419 11/20/20 1132 05/28/21 1252 06/25/21 1350 08/11/21 0933  NA 138 134*   < > 141 138 139  K 3.9 3.6   < > 4.9 4.3 4.0  CL 106 102   < > 104 107 102  CO2 23 24   < > _1 GLUCOSE 80 91   < > 79 93 101*  BUN 20 9   < > 20 34* 24  CREATININE 1.30* 1.00   < > 1.08* 1.16* 1.16*  CALCIUM 9.6 9.0   < > 10.0 9.5 9.9  GFRNONAA 45* >60  --   --  52*  --    < > = values in this interval not displayed.     HEMOGLOBIN A1C Lab Results  Component Value Date   HGBA1C 6.2 (H) 05/28/2021   MPG 134.11 10/22/2020    IMPRESSION:  No  diagnosis found.    RECOMMENDATIONS: Leslie Davis is a 66 y.o. African-American female whose past medical history and cardiac risk factors include: Coronary artery calcification, hypertension, hyperlipidemia, GERD, fatty liver disease, IDDM Type II, nephrolithiasis, advanced age.  Anginal equivalent ([place]) I suspect that her reduced functional capacity, dyspnea on exertion, and precordial discomfort are likely her anginal equivalents. She has multiple cardiovascular risk factors as noted above. Recent coronary CTA noted moderate coronary artery calcification placing her at the 93rd percentile. CTA images reviewed with the patient which notes at least a moderate degree of stenosis in the LAD distribution after the second diagonal branch.  This finding is hemodynamically significant based on CT FFR results. Given her recent ER visit, symptoms, and current work-up recommended invasive angiography to evaluate for obstructive CAD and possible coronary intervention. Risks, benefits, and alternatives for left heart catheterization with possible intervention discussed with the patient at today's office visit. Sublingual nitroglycerin tablets to use on a as needed basis. We will start Toprol-XL 25 mg p.o. daily.  The procedure of left heart catheterization with possible intervention was explained to the patient in detail.  The indication, alternatives, risks and benefits were reviewed.  Complications include but not limited to bleeding, infection, vascular injury, stroke, myocardial infarction, arrhythmia (requiring medical or  cardiopulmonary resuscitation), kidney injury (requiring short-term or long-term hemodialysis), radiation-related injury in the case of prolonged fluoroscopy use, emergent cardiac surgery, temporary or permanent pacemaker, and death. The patient understands the risks of serious complication is 1-2 in 2595 with diagnostic cardiac cath and 1-2% or less with angioplasty/stenting.  The patient  voices understanding and provides verbal feedback  questions and concerns are addressed to her satisfaction and patient wishes to proceed with coronary angiography with possible PCI.   Dyspnea on exertion See above  Coronary atherosclerosis due to calcified coronary lesion  / Atherosclerosis of aorta (HCC) Total CAC 341, 93rd percentile. Continue aspirin 81 mg p.o. daily. Continue rosuvastatin. Ischemic work-up discussed with the patient at today's visit and noted above for further reference. Further recommendations to follow.  Type 2 diabetes mellitus with stage 3a chronic kidney disease, with long-term current use of insulin ([place]) Educated the importance of glycemic control. Currently on ARB, statin therapy, Farxiga  Benign hypertension Office blood pressures are well controlled. Medications reconciled. Management discussed but no changes made.  Type 2 diabetes mellitus with hyperlipidemia (HCC) Continue rosuvastatin. Does not endorse myalgias. Managed by PCP.  We will follow peripherally.   FINAL MEDICATION LIST END OF ENCOUNTER: No orders of the defined types were placed in this encounter.   There are no discontinued medications.    Current Outpatient Medications:    acetaminophen-codeine (TYLENOL #3) 300-30 MG tablet, Take 1 tablet by mouth daily as needed for moderate pain., Disp: 30 tablet, Rfl: 0   amLODipine (NORVASC) 10 MG tablet, TAKE ONE TABLET BY MOUTH ONCE DAILY, Disp: 90 tablet, Rfl: 3   aspirin EC 81 MG tablet, Take 1 tablet (81 mg total) by mouth 2 (two) times daily. To be taken after surgery (Patient taking differently: Take 81 mg by mouth daily.), Disp: 84 tablet, Rfl: 0   Azilsartan Medoxomil (EDARBI) 80 MG TABS, TAKE ONE TABLET BY MOUTH ONCE DAILY, Disp: 90 tablet, Rfl: 2   Blood Glucose Monitoring Suppl (ONETOUCH VERIO) w/Device KIT, Use as directed to check blood sugars 2 times per day dx: e11.65, Disp: 1 kit, Rfl: 1   celecoxib (CELEBREX)  200 MG capsule, Take 200 mg by mouth 2 (two) times daily., Disp: , Rfl:    cetirizine (ZYRTEC) 10 MG tablet, Take 1 tablet (10 mg total) by mouth daily., Disp: 90 tablet, Rfl: 2   dapagliflozin propanediol (FARXIGA) 10 MG TABS tablet, Take 1 tablet (10 mg total) by mouth daily before breakfast., Disp: 90 tablet, Rfl: 1   ferrous gluconate (FERGON) 324 MG tablet, TAKE 1 TABLET BY MOUTH TWICE DAILY WITH A MEAL. (Patient taking differently: Take 324 mg by mouth 2 (two) times daily with a meal.), Disp: 100 tablet, Rfl: 0   glucose blood (ONETOUCH VERIO) test strip, Use as instructed to check blood sugars 2 times per day dx:e11.65, Disp: 150 each, Rfl: 3   insulin lispro (HUMALOG KWIKPEN) 100 UNIT/ML KwikPen, Sliding scale (Patient taking differently: Inject 4-12 Units into the skin 3 (three) times daily as needed (High blood sugar). Sliding scale), Disp: 30 mL, Rfl: 2   linaclotide ([name]) 145 MCG CAPS capsule, Take 1 capsule (145 mcg total) by mouth daily. (Patient taking differently: Take 145 mcg by mouth daily as needed (constipation).), Disp: 30 capsule, Rfl: 0   metoprolol succinate (TOPROL XL) 25 MG 24 hr tablet, Take 1 tablet (25 mg total) by mouth daily., Disp: 90 tablet, Rfl: 0   nitroGLYCERIN (NITROSTAT) 0.4 MG SL tablet, Place 1 tablet (0.4  mg total) under the tongue every 5 (five) minutes as needed for chest pain. If you require more than two tablets five minutes apart go to the nearest ER via EMS., Disp: 30 tablet, Rfl: 0   OneTouch Delica Lancets 14Y MISC, Use as directed to check blood sugars 2 times per day dx:e11.65, Disp: 150 each, Rfl: 3   pantoprazole (PROTONIX) 40 MG tablet, Take one tablet by mouth 3 days a week (Patient taking differently: Take 40 mg by mouth every morning.), Disp: 90 tablet, Rfl: 2   rosuvastatin (CRESTOR) 20 MG tablet, TAKE ONE TABLET BY MOUTH ONCE DAILY, Disp: 90 tablet, Rfl: 2   Semaglutide, 2 MG/DOSE, 8 MG/3ML SOPN, Inject 2 mg as directed once a week., Disp: 9  mL, Rfl: 0   temazepam (RESTORIL) 30 MG capsule, TAKE ONE CAPSULE BY MOUTH EVERYDAY AT BEDTIME AS NEEDED, Disp: 30 capsule, Rfl: 2   TRUEPLUS PEN NEEDLES 31G X 6 MM MISC, USE WITH pen TO INJECT insulin DAILY, Disp: 150 each, Rfl: 3  No orders of the defined types were placed in this encounter.   There are no Patient Instructions on file for this visit.   --Continue cardiac medications as reconciled in final medication list. --No follow-ups on file. Or sooner if needed. --Continue follow-up with your primary care physician regarding the management of your other chronic comorbid conditions.  Patient's questions and concerns were addressed to her satisfaction. She voices understanding of the instructions provided during this encounter.   This note was created using a voice recognition software as a result there may be grammatical errors inadvertently enclosed that do not reflect the nature of this encounter. Every attempt is made to correct such errors.  Rex Kras, [place], [facility]  Pager: [phone] Office: [phone]

## 2021-09-08 ENCOUNTER — Other Ambulatory Visit: Payer: Self-pay | Admitting: Internal Medicine

## 2021-09-09 ENCOUNTER — Encounter: Payer: Self-pay | Admitting: Rehabilitative and Restorative Service Providers"

## 2021-09-09 ENCOUNTER — Ambulatory Visit (INDEPENDENT_AMBULATORY_CARE_PROVIDER_SITE_OTHER): Payer: Medicare Other | Admitting: Rehabilitative and Restorative Service Providers"

## 2021-09-09 DIAGNOSIS — M6281 Muscle weakness (generalized): Secondary | ICD-10-CM

## 2021-09-09 DIAGNOSIS — R262 Difficulty in walking, not elsewhere classified: Secondary | ICD-10-CM

## 2021-09-09 DIAGNOSIS — M25562 Pain in left knee: Secondary | ICD-10-CM | POA: Diagnosis not present

## 2021-09-09 DIAGNOSIS — R600 Localized edema: Secondary | ICD-10-CM

## 2021-09-09 DIAGNOSIS — G8929 Other chronic pain: Secondary | ICD-10-CM

## 2021-09-09 NOTE — Therapy (Addendum)
OUTPATIENT PHYSICAL THERAPY TREATMENT NOTE [place]   Patient Name: Leslie Davis MRN: [id number] DOB:12/07/1955, 66 y.o., female Today's Date: 09/09/2021  PCP: [name], MD REFERRING PROVIDER: Aundra Dubin, PA-C  END OF SESSION:   PT End of Session - 09/09/21 0931     Visit Number 8    Number of Visits 20    Date for PT Re-Evaluation 09/15/21    Authorization Type [employer] Medicare 20% Coinsurance    Progress Note Due on Visit 10    PT Start Time 0926    PT Stop Time 1005    PT Time Calculation (min) 39 min    Activity Tolerance Patient tolerated treatment well    Behavior During Therapy WFL for tasks assessed/performed                   Past Medical History:  Diagnosis Date   DM (diabetes mellitus) ([place])    Fatty liver disease, nonalcoholic    Gastritis    Gastroparesis    GERD (gastroesophageal reflux disease)    Heartburn    Hiatal hernia    HLD (hyperlipidemia)    HTN (hypertension)    Joint pain    Renal calculus    Rheumatoid arthritis ([place])    Vitamin D deficiency    Past Surgical History:  Procedure Laterality Date   CHOLECYSTECTOMY     COLONOSCOPY     LEFT HEART CATH AND CORONARY ANGIOGRAPHY N/A 08/18/2021   Procedure: LEFT HEART CATH AND CORONARY ANGIOGRAPHY;  Surgeon: Nigel Mormon, MD;  Location: [facility];  Service: Cardiovascular;  Laterality: N/A;   TOTAL KNEE ARTHROPLASTY Left 10/27/2020   Procedure: LEFT TOTAL KNEE ARTHROPLASTY;  Surgeon: [name], MD;  Location: [facility];  Service: Orthopedics;  Laterality: Left;   TUBAL LIGATION     UPPER GASTROINTESTINAL ENDOSCOPY     Patient Active Problem List   Diagnosis Date Noted   Coronary artery disease of native artery of native heart with stable angina pectoris ([place])    Other constipation 05/25/2021   Class 2 severe obesity with serious comorbidity and body mass index (BMI) of 35.0 to 35.9 in adult ([place]) 12/01/2020   Status post total left knee replacement  10/27/2020   Primary osteoarthritis of left knee    NAFLD (nonalcoholic fatty liver disease) 08/06/2020   Other fatigue 07/23/2020   SOBOE (shortness of breath on exertion) 07/23/2020   Diabetes mellitus ([place]) 07/23/2020   Hypertension associated with type 2 diabetes mellitus ([place]) 07/23/2020   Hyperlipidemia associated with type 2 diabetes mellitus ([place]) 07/23/2020   Absolute anemia 07/23/2020   Vitamin D deficiency 07/23/2020   At risk for heart disease 07/23/2020   AKI (acute kidney injury) ([place]) 09/07/2019   Acute kidney injury ([place]) 09/06/2019   Bradycardia 09/06/2019   Hyperglycemia 09/06/2019   HLD (hyperlipidemia)    Hypertensive nephropathy 05/16/2019   Erosive gastritis 11/16/2017   Fatty liver 11/16/2017   Abdominal pain 12/11/2012   Early satiety 12/11/2012   Hiatal hernia    Hx of cholecystectomy 02/05/2011   Flu-like symptoms 02/05/2011   CONSTIPATION 08/27/2008   FATTY LIVER DISEASE 05/07/2008   Nausea with vomiting 04/23/2008   EPIGASTRIC PAIN 04/23/2008   RENAL CALCULUS 04/22/2008   Type 2 diabetes mellitus with stage 2 chronic kidney disease, with long-term current use of insulin ([place]) 05/19/2006   Type 2 diabetes mellitus with hyperlipidemia ([place]) 05/19/2006   HYPERTENSION, BENIGN SYSTEMIC 05/19/2006   GASTROESOPHAGEAL REFLUX, NO ESOPHAGITIS  05/19/2006   AMENORRHEA 05/19/2006    REFERRING DIAG: N82.956 (ICD-10-CM) - Status post total left knee replacement  THERAPY DIAG:  Chronic pain of left knee  Muscle weakness (generalized)  Difficulty in walking, not elsewhere classified  Localized edema  PERTINENT HISTORY: DM, HTN  PRECAUTIONS: None  SUBJECTIVE:  Pt indicated no pain upon arrival today.  Pt indicated she went to [place] and did a lot of walking without major troubles due to symptoms.  PAIN:  NPRS scale: 0/10 today Pain location: Lt knee Pain description:  Aggravating factors:  Relieving factors:   OBJECTIVE: (objective measures completed  at initial evaluation unless otherwise dated)   PATIENT SURVEYS:  09/02/2021:  update:  56 %  08/05/2021:   update 65%  07/07/2021: FOTO intake: 52  predicted: 63              PALPATION:  07/07/2021 : Mild tenderness to touch anterior knee joint, inferior patella Lt   LE ROM:   Active ROM Right 07/07/2021 Left 07/07/2021 Left 07/29/2021  Hip flexion       Hip extension       Hip abduction       Hip adduction       Hip internal rotation       Hip external rotation       Knee flexion   108 AROM supine heel slide c pain anterior knee 110 AROM in supine heel slide c pain noted  Knee extension   -5 in seated LAQ AROM   0 PROM in supine heel prop   Ankle dorsiflexion       Ankle plantarflexion       Ankle inversion       Ankle eversion        (Blank rows = not tested)   LE MMT:   MMT Right 07/07/2021 Left 07/07/2021 Right 07/29/2021 Left 07/29/2021 Right 08/27/2021 Left 08/27/2021 Right (Left) 09/09/2021  Hip flexion 5/5 5/5       Hip extension           Hip abduction           Hip adduction           Hip internal rotation           Hip external rotation           Knee flexion 5/5 4/5       Knee extension 4/5 21.5, 18.5 lbs 4/5 19.1, 17.3 lbs c pain 4+/5 33, 32.3 lbs 4/5 25.6, 23.3 lbs 5/5 38, 43.6 lbs 5/5 33.6, 30.8 lbs 45.9, 46.1 lb (34, 33lb )  Ankle dorsiflexion 5/5 5/5       Ankle plantarflexion           Ankle inversion           Ankle eversion            (Blank rows = not tested)     FUNCTIONAL TESTS:   08/12/2021:  Lt SLS: 7 seconds Rt SLS: 8 seconds  07/29/2021:  TUG : independent 10.73 seconds  07/07/2021         TUG: independent 15.95 seconds             Lt SLS:  3 seconds   Rt SLS: 10 seconds             18 inch transfer s UE assist: on second try   GAIT: 07/07/2021 Distance walked: Household distances within clinic  Assistive device utilized: Independent  Comments: Mild maintained knee flexion in stance Lt leg, mild reduction of stance on Lt leg.         TODAY'S TREATMENT: 09/09/2021 Therex:     Recumbent bike lvl 3 10 mins seat 6     Leg extension machine double leg up, single leg down slowly 10 lbs 2 x 10 bilateral  TherActivity (performed to improve functional movements including steps)  Step up/on/down fwd x 10 bilateral LE on 6 inch step, no UE assist. (Cues for slow control focus)  Lateral step down 4 inch 2 x 10 slow eccentric lowering   Leg press double leg 75 lbs x 10, single leg 2 x10 50 lbs very slow rep speed, performed bilateral   Neuro Re-ed  Fitter rocker board fwd/back light touching control x 20  SLS c cone touching contralateral leg (anterior, anterior/medial, anterior/lateral) x 8 bilateral   09/02/2021 Therex:     Recumbent bike lvl 3 10 mins seat 6     Seated SLR bilateral 2 x 10 1.5 lbs  TherActivity (performed to improve functional movements including steps)  Fwd step up 6 x 15 bilateral  Lateral step down 4 inch 2 x 10 slow eccentric lowering   Leg press double leg 68 lbs x 10, single leg 2 x15 43 lbs very slow rep speed, performed bilateral  Sit to stand slow lowering focus x 10 18 inch chair   08/27/2021 Therex:     Recumbent bike lvl 3 10 mins seat 6     Seated SLR bilateral x 15 slow eccentric lowering focus 1 lb  Neuro re-ed  SLS c vector reach light touch contralateral leg fwd/side/back x 8 bilateral  Lateral stepping 3 cones x 8 bilateral  TherActivity (performed to improve functional movements including steps)  Fwd step up 6 x 15 bilateral  Lateral step down 4 inch 2 x 10 slow eccentric lowering   Leg press double leg 68 lbs 2 x 10, single leg 2 x15 43 lbs very slow rep speed, performed bilateral     PATIENT EDUCATION:  07/07/2021 Education details: HEP, POC Person educated: Patient Education method: Consulting civil engineer, Media planner, Verbal cues, and Handouts Education comprehension: verbalized understanding, returned demonstration, and verbal cues required     HOME EXERCISE  PROGRAM: 07/07/2021 Access Code: [facility] URL: [email or web address] Date: 07/07/2021 Prepared by: Scot Jun   Exercises - Supine Quadricep Sets  - 3-5 x daily - 7 x weekly - 1 sets - 10 reps - 5 hold - Supine Heel Slide (Mirrored)  - 3-5 x daily - 7 x weekly - 1 sets - 10 reps - 2 hold - Seated Quad Set (Mirrored)  - 3-5 x daily - 7 x weekly - 1 sets - 10 reps - 5 hold - Seated Long Arc Quad (Mirrored)  - 3-5 x daily - 7 x weekly - 1 sets - 5-10 reps - 2 hold - Seated Straight Leg Heel Taps  - 1-2 x daily - 7 x weekly - 1-2 sets - 5-10 reps - Sit to Stand  - 1-2 x daily - 7 x weekly - 1-2 sets - 10 reps   ASSESSMENT:   CLINICAL IMPRESSION: Continued trending improvement noted in extension bilateral for knee dynamometry measurements.  Good knowledge of machine based gym activity.  Stair control going down still lacking on Lt compared to Rt but improving steadily.  Improved functional activity tolerance noted as well to this point.    OBJECTIVE IMPAIRMENTS Abnormal gait, decreased activity tolerance, decreased balance,  decreased coordination, decreased endurance, decreased mobility, difficulty walking, decreased ROM, decreased strength, hypomobility, increased edema, impaired perceived functional ability, impaired flexibility, improper body mechanics, and pain.    ACTIVITY LIMITATIONS cleaning, community activity, meal prep, laundry, and shopping.    PERSONAL FACTORS  HTN, hyperlipidemia, GERD, DM, RA  are also affecting patient's functional outcome.      REHAB POTENTIAL: Fair to good   CLINICAL DECISION MAKING: Stable/uncomplicated   EVALUATION COMPLEXITY: Low     GOALS: Goals reviewed with patient? Yes Eval: 07/07/2021  Short term PT Goals (target date for Short term goals are 3 weeks 07/28/2021) Patient will demonstrate independent use of home exercise program to maintain progress from in clinic treatments. Goal status: MET - assessed 07/29/2021   Long term  PT goals (target dates for all long term goals are 10 weeks  09/15/2021 )   1. Patient will demonstrate/report pain at worst less than or equal to 2/10 to facilitate minimal limitation in daily activity secondary to pain symptoms. Goal status: on going - assessed 09/09/2021   2. Patient will demonstrate independent use of home exercise program to facilitate ability to maintain/progress functional gains from skilled physical therapy services. Goal status: on going - assessed 09/09/2021   3. Patient will demonstrate FOTO outcome > or = 63 % to indicate reduced disability due to condition. Goal status: on going - assessed 09/09/2021   4.  Patient will demonstrate Lt knee AROM 0-110 degrees s symptoms to facilitate ability to perform transfers, sitting, ambulation, stair navigation s restriction due to mobility. Goal status: MET - assessed 09/09/2021   5.  Patient will demonstrate bilateral LE MMT 5/5, dynamometry improvement of > = 10 lbs to facilitate ability to perform usual standing, walking, stairs at PLOF s limitation due to symptoms.   Goal status: MET - 09/09/2021   6.  Patient will demonstrate/report ability to ascend/descend stairs c reciprocal gait pattern for household entry.   Goal status: MET - 09/09/2021   7.  Patient will demonstrate/report ability to return to gym workout routine.  a.  Goal Status: MET - assessed 08/27/2021     PLAN: PT FREQUENCY: 1-2x/week   PT DURATION: 10 weeks   PLANNED INTERVENTIONS:  Therapeutic exercises, Therapeutic activity, Neuro Muscular re-education, Balance training, Gait training, Patient/Family education, Joint mobilization, Stair training, DME instructions, Dry Needling, Electrical stimulation, Cryotherapy, Moist heat, Taping, Ultrasound, Ionotophoresis 4mg /ml Dexamethasone, and Manual therapy.  All included unless contraindicated   PLAN FOR NEXT SESSION:  Trial HEP.  D/c after 30 days   Scot Jun, PT, DPT, OCS, ATC 09/09/21  10:07  AM  PHYSICAL THERAPY DISCHARGE SUMMARY  Visits from Start of Care: 8  Current functional level related to goals / functional outcomes: See note   Remaining deficits: See note   Education / Equipment: HEP    Patient goals were  mostly met . Patient is being discharged due to not returning since the last visit.  Scot Jun, PT, DPT, OCS, ATC 10/14/21  3:25 PM

## 2021-09-10 ENCOUNTER — Telehealth: Payer: Self-pay

## 2021-09-10 NOTE — Telephone Encounter (Signed)
Patient called and stated thar ever since she started the BIDIL she has been "extremely tired", maybe a little lightheaded but not much. Please advise.

## 2021-09-14 ENCOUNTER — Encounter (INDEPENDENT_AMBULATORY_CARE_PROVIDER_SITE_OTHER): Payer: Self-pay | Admitting: Family Medicine

## 2021-09-14 ENCOUNTER — Telehealth: Payer: Self-pay | Admitting: Student

## 2021-09-14 ENCOUNTER — Ambulatory Visit (INDEPENDENT_AMBULATORY_CARE_PROVIDER_SITE_OTHER): Payer: Medicare Other | Admitting: Family Medicine

## 2021-09-14 VITALS — BP 144/87 | HR 51 | Temp 97.9°F | Ht 64.0 in | Wt 165.0 lb

## 2021-09-14 DIAGNOSIS — Z6828 Body mass index (BMI) 28.0-28.9, adult: Secondary | ICD-10-CM

## 2021-09-14 DIAGNOSIS — E559 Vitamin D deficiency, unspecified: Secondary | ICD-10-CM | POA: Diagnosis not present

## 2021-09-14 DIAGNOSIS — E669 Obesity, unspecified: Secondary | ICD-10-CM | POA: Diagnosis not present

## 2021-09-14 DIAGNOSIS — E1159 Type 2 diabetes mellitus with other circulatory complications: Secondary | ICD-10-CM

## 2021-09-14 DIAGNOSIS — Z794 Long term (current) use of insulin: Secondary | ICD-10-CM

## 2021-09-14 DIAGNOSIS — E66812 Obesity, class 2: Secondary | ICD-10-CM

## 2021-09-14 DIAGNOSIS — Z7985 Long-term (current) use of injectable non-insulin antidiabetic drugs: Secondary | ICD-10-CM

## 2021-09-14 DIAGNOSIS — I152 Hypertension secondary to endocrine disorders: Secondary | ICD-10-CM | POA: Diagnosis not present

## 2021-09-14 MED ORDER — SEMAGLUTIDE (2 MG/DOSE) 8 MG/3ML ~~LOC~~ SOPN: 2.0000 mg | PEN_INJECTOR | SUBCUTANEOUS | 0 refills | Status: DC

## 2021-09-15 LAB — HEMOGLOBIN A1C
Est. average glucose Bld gHb Est-mCnc: 117 mg/dL
Hgb A1c MFr Bld: 5.7 % — ABNORMAL HIGH (ref 4.8–5.6)

## 2021-09-15 LAB — VITAMIN D 25 HYDROXY (VIT D DEFICIENCY, FRACTURES): Vit D, 25-Hydroxy: 48.3 ng/mL (ref 30.0–100.0)

## 2021-09-16 ENCOUNTER — Encounter: Payer: Self-pay | Admitting: Internal Medicine

## 2021-09-16 ENCOUNTER — Other Ambulatory Visit: Payer: Self-pay | Admitting: Nurse Practitioner

## 2021-09-16 ENCOUNTER — Ambulatory Visit (INDEPENDENT_AMBULATORY_CARE_PROVIDER_SITE_OTHER): Payer: Medicare Other | Admitting: Internal Medicine

## 2021-09-16 ENCOUNTER — Encounter: Payer: Medicare Other | Admitting: Rehabilitative and Restorative Service Providers"

## 2021-09-16 ENCOUNTER — Other Ambulatory Visit: Payer: Self-pay | Admitting: Internal Medicine

## 2021-09-16 VITALS — BP 152/58 | HR 60 | Temp 98.5°F | Ht 64.0 in | Wt 169.4 lb

## 2021-09-16 DIAGNOSIS — I25118 Atherosclerotic heart disease of native coronary artery with other forms of angina pectoris: Secondary | ICD-10-CM | POA: Diagnosis not present

## 2021-09-16 DIAGNOSIS — Z794 Long term (current) use of insulin: Secondary | ICD-10-CM | POA: Diagnosis not present

## 2021-09-16 DIAGNOSIS — I1310 Hypertensive heart and chronic kidney disease without heart failure, with stage 1 through stage 4 chronic kidney disease, or unspecified chronic kidney disease: Secondary | ICD-10-CM | POA: Diagnosis not present

## 2021-09-16 DIAGNOSIS — I129 Hypertensive chronic kidney disease with stage 1 through stage 4 chronic kidney disease, or unspecified chronic kidney disease: Secondary | ICD-10-CM

## 2021-09-16 DIAGNOSIS — N1831 Chronic kidney disease, stage 3a: Secondary | ICD-10-CM | POA: Diagnosis not present

## 2021-09-16 DIAGNOSIS — E1169 Type 2 diabetes mellitus with other specified complication: Secondary | ICD-10-CM | POA: Diagnosis not present

## 2021-09-16 DIAGNOSIS — Z6829 Body mass index (BMI) 29.0-29.9, adult: Secondary | ICD-10-CM

## 2021-09-16 DIAGNOSIS — E1122 Type 2 diabetes mellitus with diabetic chronic kidney disease: Secondary | ICD-10-CM | POA: Diagnosis not present

## 2021-09-16 DIAGNOSIS — E785 Hyperlipidemia, unspecified: Secondary | ICD-10-CM | POA: Diagnosis not present

## 2021-09-16 MED ORDER — CETIRIZINE HCL 10 MG PO TABS: 10.0000 mg | ORAL_TABLET | Freq: Every day | ORAL | 2 refills | Status: DC

## 2021-09-16 MED ORDER — ASPIRIN 81 MG PO TBEC: 81.0000 mg | DELAYED_RELEASE_TABLET | Freq: Every day | ORAL | 2 refills | Status: AC

## 2021-09-16 NOTE — Progress Notes (Signed)
This visit occurred during the SARS-CoV-2 public health emergency.  Safety protocols were in place, including screening questions prior to the visit, additional usage of staff PPE, and extensive cleaning of exam room while observing appropriate contact time as indicated for disinfecting solutions.  Subjective:     Patient ID: Leslie Davis , female    DOB: [birth year] , 66 y.o.   MRN: [id number]   Chief Complaint  Patient presents with   Diabetes   Hypertension    HPI  Patient is here today for a diabetes follow up. Patient is taking medicines regularly. She states her BS are running between 76-139. She states on average her sugars are about 110.   Patient states that bp medicine and heart medicine is making her tired and dizzy. She is now followed by Cardology for CAD.   Diabetes She presents for her follow-up diabetic visit. She has type 2 diabetes mellitus. Her disease course has been stable. Hypoglycemia symptoms include dizziness. Pertinent negatives for diabetes include no blurred vision, no polydipsia, no polyphagia and no polyuria. There are no hypoglycemic complications. Diabetic complications include nephropathy. Risk factors for coronary artery disease include diabetes mellitus, dyslipidemia, hypertension and post-menopausal. She participates in exercise intermittently. An ACE inhibitor/angiotensin II receptor blocker is being taken. Eye exam is current.  Hypertension This is a chronic problem. The current episode started more than 1 year ago. The problem has been gradually improving since onset. Pertinent negatives include no blurred vision. Risk factors for coronary artery disease include dyslipidemia and diabetes mellitus. The current treatment provides moderate improvement.     Past Medical History:  Diagnosis Date   DM (diabetes mellitus) ([place])    Fatty liver disease, nonalcoholic    Gastritis    Gastroparesis    GERD (gastroesophageal reflux disease)    Heartburn     Hiatal hernia    HLD (hyperlipidemia)    HTN (hypertension)    Joint pain    Renal calculus    Rheumatoid arthritis ([place])    Vitamin D deficiency      Family History  Problem Relation Age of Onset   Diabetes Mother    Thyroid disease Mother    Kidney disease Mother    Hypertension Father    Hyperlipidemia Father    Sudden death Father    Stroke Father    Diabetes Sister    Heart disease Sister    Kidney disease Sister    COPD Sister    Heart disease Brother 64   Diabetes Brother    Stomach cancer Maternal Aunt        X 2 aunts   Diabetes Maternal Grandmother    Colon cancer Neg Hx    Esophageal cancer Neg Hx    Rectal cancer Neg Hx      Current Outpatient Medications:    acetaminophen-codeine (TYLENOL #3) 300-30 MG tablet, Take 1 tablet by mouth daily as needed for moderate pain., Disp: 30 tablet, Rfl: 0   amLODipine (NORVASC) 10 MG tablet, TAKE ONE TABLET BY MOUTH ONCE DAILY, Disp: 90 tablet, Rfl: 3   aspirin EC 81 MG tablet, Take 1 tablet (81 mg total) by mouth daily. Swallow whole., Disp: 150 tablet, Rfl: 2   Azilsartan Medoxomil (EDARBI) 80 MG TABS, TAKE ONE TABLET BY MOUTH ONCE DAILY, Disp: 90 tablet, Rfl: 2   Blood Glucose Monitoring Suppl (ONETOUCH VERIO) w/Device KIT, Use as directed to check blood sugars 2 times per day dx: e11.65, Disp: 1 kit, Rfl: 1  dapagliflozin propanediol (FARXIGA) 10 MG TABS tablet, Take 1 tablet (10 mg total) by mouth daily before breakfast., Disp: 90 tablet, Rfl: 1   ferrous gluconate (FERGON) 324 MG tablet, TAKE 1 TABLET BY MOUTH TWICE DAILY WITH A MEAL. (Patient taking differently: Take 324 mg by mouth 2 (two) times daily with a meal.), Disp: 100 tablet, Rfl: 0   glucose blood (ONETOUCH VERIO) test strip, Use as instructed to check blood sugars 2 times per day dx:e11.65, Disp: 150 each, Rfl: 3   insulin lispro (HUMALOG KWIKPEN) 100 UNIT/ML KwikPen, Sliding scale (Patient taking differently: Inject 4-12 Units into the skin 3 (three)  times daily as needed (High blood sugar). Sliding scale), Disp: 30 mL, Rfl: 2   linaclotide ([name]) 145 MCG CAPS capsule, Take 1 capsule (145 mcg total) by mouth daily. (Patient taking differently: Take 145 mcg by mouth daily as needed (constipation).), Disp: 30 capsule, Rfl: 0   OneTouch Delica Lancets 11B MISC, Use as directed to check blood sugars 2 times per day dx:e11.65, Disp: 150 each, Rfl: 3   pantoprazole (PROTONIX) 40 MG tablet, Take 1 tablet (40 mg total) by mouth every morning., Disp: 90 tablet, Rfl: 2   rosuvastatin (CRESTOR) 20 MG tablet, TAKE ONE TABLET BY MOUTH ONCE DAILY, Disp: 90 tablet, Rfl: 0   Semaglutide, 2 MG/DOSE, 8 MG/3ML SOPN, Inject 2 mg as directed once a week., Disp: 9 mL, Rfl: 0   temazepam (RESTORIL) 30 MG capsule, TAKE ONE CAPSULE BY MOUTH EVERYDAY AT BEDTIME AS NEEDED, Disp: 30 capsule, Rfl: 2   TRUEPLUS PEN NEEDLES 31G X 6 MM MISC, USE WITH pen TO INJECT insulin DAILY, Disp: 150 each, Rfl: 3   carvedilol (COREG) 3.125 MG tablet, Take 1 tablet (3.125 mg total) by mouth 2 (two) times daily with a meal., Disp: 60 tablet, Rfl: 0   celecoxib (CELEBREX) 200 MG capsule, Take 1 capsule (200 mg total) by mouth 2 (two) times daily as needed., Disp: 180 capsule, Rfl: 2   cetirizine (ZYRTEC) 10 MG tablet, Take 1 tablet (10 mg total) by mouth daily., Disp: 90 tablet, Rfl: 2   nitroGLYCERIN (NITROSTAT) 0.4 MG SL tablet, Place 1 tablet (0.4 mg total) under the tongue every 5 (five) minutes as needed for chest pain. If you require more than two tablets five minutes apart go to the nearest ER via EMS., Disp: 30 tablet, Rfl: 0   Allergies  Allergen Reactions   Fiasp [Insulin Aspart (W-Niacinamide)] Hives     Review of Systems  Constitutional: Negative.   Eyes:  Negative for blurred vision.  Respiratory: Negative.    Cardiovascular: Negative.   Gastrointestinal: Negative.   Endocrine: Negative for polydipsia, polyphagia and polyuria.  Neurological:  Positive for dizziness.   Psychiatric/Behavioral: Negative.    All other systems reviewed and are negative.    Today's Vitals   09/16/21 1415 09/16/21 1511 09/16/21 1512  BP: (!) 140/52 (!) 158/67 (!) 152/58  Pulse: 60 (!) 53 60  Temp: 98.5 F (36.9 C)    TempSrc: Oral    Weight: 169 lb 6.4 oz (76.8 kg)    Height: 5' 4" (1.626 m)     Body mass index is 29.08 kg/m.  Wt Readings from Last 3 Encounters:  10/08/21 167 lb (75.8 kg)  09/16/21 169 lb 6.4 oz (76.8 kg)  09/14/21 165 lb (74.8 kg)     Objective:  Physical Exam Vitals and nursing note reviewed.  Constitutional:      Appearance: Normal appearance.  HENT:  Head: Normocephalic and atraumatic.  Eyes:     Extraocular Movements: Extraocular movements intact.  Cardiovascular:     Rate and Rhythm: Normal rate and regular rhythm.     Heart sounds: Normal heart sounds.  Pulmonary:     Effort: Pulmonary effort is normal.     Breath sounds: Normal breath sounds.  Musculoskeletal:     Cervical back: Normal range of motion.  Skin:    General: Skin is warm.  Neurological:     General: No focal deficit present.     Mental Status: She is alert.  Psychiatric:        Mood and Affect: Mood normal.        Behavior: Behavior normal.      Assessment And Plan:     1. Type 2 diabetes mellitus with stage 3a chronic kidney disease, with long-term current use of insulin ([facility]) Comments: Chronic, I will check labs as below. I will check lipid panel when fasting at her Sept 2023 physical. Reminded to avoid NSAIDS due to underlying CKD. - BMP8+EGFR - Hemoglobin A1c  2. Hyperlipidemia associated with type 2 diabetes mellitus ([place]) Comments: Chronic, LDL goal <70. Importance of statin compliance was d/w patient.  She will c/w rosuvastatin.   3. Hypertensive heart and renal disease with renal failure, stage 1 through stage 4 or unspecified chronic kidney disease, without heart failure Comments: Chronic, uncontrolled. Unfortunately, repeat BP readings were  also uncontrolled. Importance of medication/dietary compliance stressed to patient.   4. Coronary artery disease of native artery of native heart with stable angina pectoris ([facility]) Comments: Chronic, encouraged to comply with ASA, statin and Bblocker therapies. Importance of regular exercise was again stressed to the patient.   5. BMI 29.0-29.9,adult Comments: BMI is acceptable for her demographic, encouraged to aim for at least 150 minutes of exercise per week.    Patient was given opportunity to ask questions. Patient verbalized understanding of the plan and was able to repeat key elements of the plan. All questions were answered to their satisfaction.   I, [name], MD, have reviewed all documentation for this visit. The documentation on 09/16/21 for the exam, diagnosis, procedures, and orders are all accurate and complete.   IF YOU HAVE BEEN REFERRED TO A SPECIALIST, IT MAY TAKE 1-2 WEEKS TO SCHEDULE/PROCESS THE REFERRAL. IF YOU HAVE NOT HEARD FROM US/SPECIALIST IN TWO WEEKS, PLEASE GIVE [place] A CALL AT [phone] X [phone].   THE PATIENT IS ENCOURAGED TO PRACTICE SOCIAL DISTANCING DUE TO THE COVID-19 PANDEMIC.

## 2021-09-16 NOTE — Patient Instructions (Addendum)

## 2021-09-17 ENCOUNTER — Telehealth: Payer: Self-pay

## 2021-09-17 LAB — BMP8+EGFR
BUN/Creatinine Ratio: 21 (ref 12–28)
BUN: 24 mg/dL (ref 8–27)
CO2: 21 mmol/L (ref 20–29)
Calcium: 9.2 mg/dL (ref 8.7–10.3)
Chloride: 104 mmol/L (ref 96–106)
Creatinine, Ser: 1.14 mg/dL — ABNORMAL HIGH (ref 0.57–1.00)
Glucose: 89 mg/dL (ref 70–99)
Potassium: 4.9 mmol/L (ref 3.5–5.2)
Sodium: 138 mmol/L (ref 134–144)
eGFR: 53 mL/min/{1.73_m2} — ABNORMAL LOW (ref >59)

## 2021-09-17 LAB — HEMOGLOBIN A1C
Est. average glucose Bld gHb Est-mCnc: 126 mg/dL
Hgb A1c MFr Bld: 6 % — ABNORMAL HIGH (ref 4.8–5.6)
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
## 2021-09-17 NOTE — Progress Notes (Signed)
Chief Complaint:   OBESITY Leslie Davis is here to discuss her progress with her obesity treatment plan along with follow-up of her obesity related diagnoses. Leslie Davis is on the Category 1 Plan with breakfast options and states she is following her eating plan approximately 95% of the time. Leslie Davis states she is doing cardio and strength training 90 minutes 3 times per week.  Today's visit was #: 40 Starting weight: 208 lbs Starting date: 07/23/2020 Today's weight: 165 lbs Today's date: 09/14/2021 Total lbs lost to date: 43 Total lbs lost since last in-office visit: 0  Interim History: Pt is doing great. She is going to the gym 3 days a week- walks, bikes, weight lifting (arms, legs, and abdomen).  Subjective:   1. Hypertension associated with type 2 diabetes mellitus ([place]) Pt's BP runs 140-155/60's at home. She has been checking it daily lately. She was started on Bidil by cardiology and pt has been experiencing dizziness for a month after taking the medicine and has headaches.  2. Type 2 diabetes mellitus with other circulatory complication, with long-term current use of insulin (HCC) Pt's blood sugars run 70's at the lowest and 130's are the highest. She is on [place] and Ozempic, and reports it has been months since she last took Humalog. Pt denies concerns.  3. Vitamin D deficiency Pt has a history of Vit D deficiency and with only 1,000 IU of Vit D daily, her Vit D level is still too high. Pt's Vit D was last checked 03/31/21 and was 82.1. Her last bone density was 09/2017.  Assessment/Plan:   Orders Placed This Encounter  Procedures   Hemoglobin A1c   VITAMIN D 25 Hydroxy (Vit-D Deficiency, Fractures)    Medications Discontinued During This Encounter  Medication Reason   Semaglutide, 2 MG/DOSE, 8 MG/3ML SOPN Reorder     Meds ordered this encounter  Medications   Semaglutide, 2 MG/DOSE, 8 MG/3ML SOPN    Sig: Inject 2 mg as directed once a week.    Dispense:  9 mL     Refill:  0     1. Hypertension associated with type 2 diabetes mellitus (Leslie Davis) Leslie Davis is working on healthy weight loss and exercise to improve blood pressure control. We will watch for signs of hypotension as she continues her lifestyle modifications. BP essentially at goal, but due to side effects, pt will need to discuss with cardiology alternative meds, if side effects are intolerable.  2. Type 2 diabetes mellitus with other circulatory complication, with long-term current use of insulin (HCC) Good blood sugar control is important to decrease the likelihood of diabetic complications such as nephropathy, neuropathy, limb loss, blindness, coronary artery disease, and death. Intensive lifestyle modification including diet, exercise and weight loss are the first line of treatment for diabetes. Check labs today.  Refill- Semaglutide, 2 MG/DOSE, 8 MG/3ML SOPN; Inject 2 mg as directed once a week.  Dispense: 9 mL; Refill: 0  - Hemoglobin A1c  3. Vitamin D deficiency Low Vitamin D level contributes to fatigue and are associated with obesity, breast, and colon cancer. She agrees to continue to hold off on taking Vitamin D supplement and will follow-up for routine testing of Vitamin D, at least 2-3 times per year to avoid over-replacement. Check Vit D level today. Continue to hold off on Vit D supplement. Pt also needs bone density and mammogram in the very near future.  - VITAMIN D 25 Hydroxy (Vit-D Deficiency, Fractures)  4. Obesity with current BMI of  28.4 Leslie Davis is currently in the action stage of change. As such, her goal is to continue with weight loss efforts. She has agreed to the Category 1 Plan with breakfast and lunch options.   Exercise goals:  As is  Behavioral modification strategies: avoiding temptations and planning for success.  Leslie Davis has agreed to follow-up with our clinic in 3 weeks. She was informed of the importance of frequent follow-up visits to maximize her success with  intensive lifestyle modifications for her multiple health conditions.   Objective:   Blood pressure (!) 144/87, pulse (!) 51, temperature 97.9 F (36.6 C), height '5\' 4"'$  (1.626 m), weight 165 lb (74.8 kg), SpO2 99 %. Body mass index is 28.32 kg/m.  General: Cooperative, alert, well developed, in no acute distress. HEENT: Conjunctivae and lids unremarkable. Cardiovascular: Regular rhythm.  Lungs: Normal work of breathing. Neurologic: No focal deficits.   Lab Results  Component Value Date   CREATININE 1.14 (H) 09/16/2021   BUN 24 09/16/2021   NA 138 09/16/2021   K 4.9 09/16/2021   CL 104 09/16/2021   CO2 21 09/16/2021   Lab Results  Component Value Date   ALT 37 06/25/2021   AST 47 (H) 06/25/2021   ALKPHOS 94 06/25/2021   BILITOT 0.3 06/25/2021   Lab Results  Component Value Date   HGBA1C 6.0 (H) 09/16/2021   HGBA1C 5.7 (H) 09/14/2021   HGBA1C 6.2 (H) 05/28/2021   HGBA1C 6.1 (H) 02/26/2021   HGBA1C 6.3 (H) 10/22/2020   No results found for: "INSULIN" Lab Results  Component Value Date   TSH 1.640 06/17/2021   Lab Results  Component Value Date   CHOL 156 11/20/2020   HDL 61 11/20/2020   LDLCALC 78 11/20/2020   TRIG 89 11/20/2020   CHOLHDL 2.6 11/20/2020   Lab Results  Component Value Date   VD25OH 48.3 09/14/2021   VD25OH 82.1 03/31/2021   VD25OH 102.0 (H) 11/20/2020   Lab Results  Component Value Date   WBC 3.4 08/11/2021   HGB 13.5 08/11/2021   HCT 40.8 08/11/2021   MCV 88 08/11/2021   PLT 261 08/11/2021   Lab Results  Component Value Date   IRON 68 11/16/2017   TIBC 355 11/16/2017   FERRITIN 45.2 11/16/2017    Obesity Behavioral Intervention:   Approximately 15 minutes were spent on the discussion below.  ASK: We discussed the diagnosis of obesity with Leslie Davis today and Leslie Davis agreed to give [place] permission to discuss obesity behavioral modification therapy today.  ASSESS: Leslie Davis has the diagnosis of obesity and her BMI today is 28.4.  Leslie Davis is in the action stage of change.   ADVISE: Leslie Davis was educated on the multiple health risks of obesity as well as the benefit of weight loss to improve her health. She was advised of the need for long term treatment and the importance of lifestyle modifications to improve her current health and to decrease her risk of future health problems.  AGREE: Multiple dietary modification options and treatment options were discussed and Leslie Davis agreed to follow the recommendations documented in the above note.  ARRANGE: Leslie Davis was educated on the importance of frequent visits to treat obesity as outlined per CMS and USPSTF guidelines and agreed to schedule her next follow up appointment today.  Attestation Statements:   Reviewed by clinician on day of visit: allergies, medications, problem list, medical history, surgical history, family history, social history, and previous encounter notes.  IKathlene Davis, BS, CMA, am acting as transcriptionist  for [employer], DO.    I have reviewed the above documentation for accuracy and completeness, and I agree with the above. Leslie Davis, D.O.  The [place] was signed into law in 2016 which includes the topic of electronic health records.  This provides immediate access to information in MyChart.  This includes consultation notes, operative notes, office notes, lab results and pathology reports.  If you have any questions about what you read please let us know at your next visit so we can discuss your concerns and take corrective action if need be.  We are right here with you.

## 2021-09-18 ENCOUNTER — Telehealth: Payer: Self-pay

## 2021-09-18 NOTE — Telephone Encounter (Signed)
Yes ST

## 2021-09-18 NOTE — Telephone Encounter (Signed)
Called and spoke to pt, pt voiced understanding.

## 2021-09-18 NOTE — Telephone Encounter (Signed)
Pt called and stated that the Bidil bid is still making her SOB, dizzy, and her shoulder is still hurting. She wants to know if she can just stop taking the medication. Please advise.

## 2021-09-19 ENCOUNTER — Other Ambulatory Visit: Payer: Self-pay | Admitting: Physician Assistant

## 2021-09-21 ENCOUNTER — Telehealth: Payer: Self-pay
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
## 2021-09-21 NOTE — Telephone Encounter (Signed)
Called patient she voiced understanding

## 2021-09-21 NOTE — Telephone Encounter (Signed)
She may reduce metoprolol to 1/2 tablet daily and continue to monitor. Please set her up for OV follow up in 1-2 weeks preferably with ST, but I an see if need be and patient willing. Patient expressed frustration dealing with me previously.

## 2021-09-23 ENCOUNTER — Other Ambulatory Visit: Payer: Self-pay | Admitting: Cardiology

## 2021-09-23 ENCOUNTER — Encounter: Payer: Medicare Other | Admitting: Rehabilitative and Restorative Service Providers"

## 2021-09-23 DIAGNOSIS — I2089 Other forms of angina pectoris: Secondary | ICD-10-CM

## 2021-09-23 DIAGNOSIS — I2584 Coronary atherosclerosis due to calcified coronary lesion: Secondary | ICD-10-CM

## 2021-09-23 DIAGNOSIS — I2510 Atherosclerotic heart disease of native coronary artery without angina pectoris: Secondary | ICD-10-CM

## 2021-09-23 DIAGNOSIS — R0609 Other forms of dyspnea: Secondary | ICD-10-CM

## 2021-09-23 DIAGNOSIS — I208 Other forms of angina pectoris: Secondary | ICD-10-CM

## 2021-09-25 ENCOUNTER — Telehealth: Payer: Self-pay

## 2021-09-25 NOTE — Telephone Encounter (Signed)
  Care Management   Follow Up Note   09/25/2021 Name: Leslie Davis MRN: [id number] DOB: [birth year]   Referred by: [name], MD Reason for referral : No chief complaint on file.   An unsuccessful telephone outreach was attempted today. The patient was referred to the case management team for assistance with care management and care coordination.  [name] has missed multiple phone calls for mediation coordination from [facility]. I reached out to patient to see if there were any unmet needs or concerns. There was no answer so I left a non-descriptive voicemail on her phone.   Follow Up Plan: The patient has been provided with contact information for the care management team and has been advised to call with any health related questions or concerns.   [name], CPP, PharmD Clinical Pharmacist Practitioner [facility] [phone]

## 2021-09-28 ENCOUNTER — Other Ambulatory Visit: Payer: Self-pay | Admitting: Cardiology

## 2021-09-28 DIAGNOSIS — I2510 Atherosclerotic heart disease of native coronary artery without angina pectoris: Secondary | ICD-10-CM

## 2021-09-28 DIAGNOSIS — I10 Essential (primary) hypertension: Secondary | ICD-10-CM

## 2021-09-29 ENCOUNTER — Telehealth: Payer: Self-pay | Admitting: Orthopaedic Surgery

## 2021-09-29 ENCOUNTER — Other Ambulatory Visit: Payer: Self-pay | Admitting: Physician Assistant

## 2021-09-29 ENCOUNTER — Other Ambulatory Visit: Payer: Self-pay

## 2021-09-29 DIAGNOSIS — I208 Other forms of angina pectoris: Secondary | ICD-10-CM

## 2021-09-29 DIAGNOSIS — R0609 Other forms of dyspnea: Secondary | ICD-10-CM

## 2021-09-29 DIAGNOSIS — I2510 Atherosclerotic heart disease of native coronary artery without angina pectoris: Secondary | ICD-10-CM

## 2021-09-29 MED ORDER — CELECOXIB 200 MG PO CAPS: 200.0000 mg | ORAL_CAPSULE | Freq: Two times a day (BID) | ORAL | 2 refills | Status: DC | PRN

## 2021-09-29 MED ORDER — CELECOXIB 200 MG PO CAPS: 200.0000 mg | ORAL_CAPSULE | Freq: Two times a day (BID) | ORAL | 2 refills | Status: AC | PRN

## 2021-09-29 MED ORDER — METOPROLOL SUCCINATE ER 25 MG PO TB24: 12.5000 mg | ORAL_TABLET | Freq: Every day | ORAL | 0 refills | Status: DC

## 2021-09-29 NOTE — Telephone Encounter (Signed)
Sent in

## 2021-09-29 NOTE — Telephone Encounter (Signed)
sent 

## 2021-09-29 NOTE — Telephone Encounter (Signed)
Please call in Celebrex

## 2021-09-29 NOTE — Chronic Care Management (AMB) (Signed)
09-29-2021: Left voicemail with Dr. Brennan Bailey MA for new script on Metoprolol 25 1/2 tablet daily to be sent to [facility]. Contacted Aundra Dubin, PA-C office and a request for celebrex was sent in to nurse.  [place] Pharmacist Assistant [phone]

## 2021-09-29 NOTE — Telephone Encounter (Signed)
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 Asking qty to 90 day supply. Script was for 30. Please call pharmacy at [phone]

## 2021-09-30 ENCOUNTER — Encounter: Payer: Medicare Other | Admitting: Rehabilitative and Restorative Service Providers"

## 2021-10-07 ENCOUNTER — Encounter: Payer: Self-pay | Admitting: Gastroenterology

## 2021-10-07 ENCOUNTER — Encounter: Payer: Medicare Other | Admitting: Rehabilitative and Restorative Service Providers"

## 2021-10-08 ENCOUNTER — Encounter: Payer: Self-pay | Admitting: Cardiology

## 2021-10-08 ENCOUNTER — Ambulatory Visit: Payer: Medicare Other | Admitting: Cardiology

## 2021-10-08 VITALS — BP 152/62 | HR 51 | Temp 97.2°F | Resp 16 | Ht 64.0 in | Wt 167.0 lb

## 2021-10-08 DIAGNOSIS — I2584 Coronary atherosclerosis due to calcified coronary lesion: Secondary | ICD-10-CM | POA: Diagnosis not present

## 2021-10-08 DIAGNOSIS — E1122 Type 2 diabetes mellitus with diabetic chronic kidney disease: Secondary | ICD-10-CM | POA: Diagnosis not present

## 2021-10-08 DIAGNOSIS — N1831 Chronic kidney disease, stage 3a: Secondary | ICD-10-CM | POA: Diagnosis not present

## 2021-10-08 DIAGNOSIS — I2510 Atherosclerotic heart disease of native coronary artery without angina pectoris: Secondary | ICD-10-CM | POA: Diagnosis not present

## 2021-10-08 DIAGNOSIS — E1169 Type 2 diabetes mellitus with other specified complication: Secondary | ICD-10-CM

## 2021-10-08 DIAGNOSIS — I700 Atherosclerosis of aorta: Secondary | ICD-10-CM | POA: Diagnosis not present

## 2021-10-08 DIAGNOSIS — Z794 Long term (current) use of insulin: Secondary | ICD-10-CM

## 2021-10-08 DIAGNOSIS — E785 Hyperlipidemia, unspecified: Secondary | ICD-10-CM | POA: Diagnosis not present

## 2021-10-08 DIAGNOSIS — I10 Essential (primary) hypertension: Secondary | ICD-10-CM

## 2021-10-08 MED ORDER — CARVEDILOL 3.125 MG PO TABS: 3.1250 mg | ORAL_TABLET | Freq: Two times a day (BID) | ORAL | 0 refills | Status: DC

## 2021-10-08 NOTE — Progress Notes (Signed)
ID:  Leslie Davis, DOB 23-Sep-1955, MRN [id number]  PCP:  [name], MD  Cardiologist:  Rex Kras, DO, [facility] (established care July 09, 2021) Former Cardiology Providers: Dr. Minus Breeding.   Date: 10/08/21 Last Office Visit: 09/07/2021  Chief Complaint  Patient presents with   Coronary Artery Disease   Follow-up    HPI  Leslie Davis is a 66 y.o. African-American female whose past medical history and cardiovascular risk factors include: CAD, Coronary artery calcification, aortic atherosclerosis, hypertension, hyperlipidemia, GERD, fatty liver disease, IDDM Type II, nephrolithiasis, advanced age.  Patient is accompanied by her husband at today's office visit.  She was referred to the practice for shortness of breath and chest tightness evaluation.  She underwent coronary CTA which was concerning for hemodynamically significant stenosis and subsequently underwent left heart catheterization in May 2023.  She was noted to have obstructive CAD in the LAD distribution; however, since her symptoms had improved after up titration of antianginal therapy this shared decision between the patient and interventional cardiology Dr. Virgina Jock was to treat her disease medically and if refractory to medications could consider PCI to the LAD.  During prior office visits we focused on improving her antianginal therapy and blood pressure management.  She was started on BiDil for better blood pressure control but was unable to tolerate it.  She was also complaining of feeling tired and fatigue and her metoprolol dose was reduced from 25 mg p.o. daily to 12.5 mg p.o. daily.  She still continues to feel tired and fatigue.  Her overall physical endurance remains relatively stable and does not have effort related anginal symptoms or heart failure symptoms.   Shortness of breath is slowly improving.  She brings her blood pressure log for review and her systolic blood pressures range between 135-147  mmHg.  She continues to go to the gym at least twice a week.  She walks for 30 minutes and bikes approximately 2 miles.  No use of sublingual nitroglycerin tablets.  ALLERGIES: Allergies  Allergen Reactions   Fiasp [Insulin Aspart (W-Niacinamide)] Hives    MEDICATION LIST PRIOR TO VISIT: Current Meds  Medication Sig   acetaminophen-codeine (TYLENOL #3) 300-30 MG tablet Take 1 tablet by mouth daily as needed for moderate pain.   amLODipine (NORVASC) 10 MG tablet TAKE ONE TABLET BY MOUTH ONCE DAILY   aspirin EC 81 MG tablet Take 1 tablet (81 mg total) by mouth daily. Swallow whole.   Azilsartan Medoxomil (EDARBI) 80 MG TABS TAKE ONE TABLET BY MOUTH ONCE DAILY   Blood Glucose Monitoring Suppl (ONETOUCH VERIO) w/Device KIT Use as directed to check blood sugars 2 times per day dx: e11.65   carvedilol (COREG) 3.125 MG tablet Take 1 tablet (3.125 mg total) by mouth 2 (two) times daily with a meal.   celecoxib (CELEBREX) 200 MG capsule Take 1 capsule (200 mg total) by mouth 2 (two) times daily as needed.   cetirizine (ZYRTEC) 10 MG tablet Take 1 tablet (10 mg total) by mouth daily.   dapagliflozin propanediol (FARXIGA) 10 MG TABS tablet Take 1 tablet (10 mg total) by mouth daily before breakfast.   ferrous gluconate (FERGON) 324 MG tablet TAKE 1 TABLET BY MOUTH TWICE DAILY WITH A MEAL. (Patient taking differently: Take 324 mg by mouth 2 (two) times daily with a meal.)   glucose blood (ONETOUCH VERIO) test strip Use as instructed to check blood sugars 2 times per day dx:e11.65   insulin lispro (HUMALOG KWIKPEN) 100 UNIT/ML KwikPen Sliding  scale (Patient taking differently: Inject 4-12 Units into the skin 3 (three) times daily as needed (High blood sugar). Sliding scale)   linaclotide ([name]) 145 MCG CAPS capsule Take 1 capsule (145 mcg total) by mouth daily. (Patient taking differently: Take 145 mcg by mouth daily as needed (constipation).)   OneTouch Delica Lancets 37T MISC Use as directed to  check blood sugars 2 times per day dx:e11.65   pantoprazole (PROTONIX) 40 MG tablet Take 1 tablet (40 mg total) by mouth every morning.   rosuvastatin (CRESTOR) 20 MG tablet TAKE ONE TABLET BY MOUTH ONCE DAILY   Semaglutide, 2 MG/DOSE, 8 MG/3ML SOPN Inject 2 mg as directed once a week.   temazepam (RESTORIL) 30 MG capsule TAKE ONE CAPSULE BY MOUTH EVERYDAY AT BEDTIME AS NEEDED   TRUEPLUS PEN NEEDLES 31G X 6 MM MISC USE WITH pen TO INJECT insulin DAILY   [DISCONTINUED] isosorbide-hydrALAZINE (BIDIL) 20-37.5 MG tablet TAKE ONE TABLET BY MOUTH three times daily   [DISCONTINUED] metoprolol succinate (TOPROL-XL) 25 MG 24 hr tablet Take 0.5 tablets (12.5 mg total) by mouth daily.     PAST MEDICAL HISTORY: Past Medical History:  Diagnosis Date   DM (diabetes mellitus) ([place])    Fatty liver disease, nonalcoholic    Gastritis    Gastroparesis    GERD (gastroesophageal reflux disease)    Heartburn    Hiatal hernia    HLD (hyperlipidemia)    HTN (hypertension)    Joint pain    Renal calculus    Rheumatoid arthritis ([place])    Vitamin D deficiency     PAST SURGICAL HISTORY: Past Surgical History:  Procedure Laterality Date   CHOLECYSTECTOMY     COLONOSCOPY     LEFT HEART CATH AND CORONARY ANGIOGRAPHY N/A 08/18/2021   Procedure: LEFT HEART CATH AND CORONARY ANGIOGRAPHY;  Surgeon: Nigel Mormon, MD;  Location: [facility];  Service: Cardiovascular;  Laterality: N/A;   TOTAL KNEE ARTHROPLASTY Left 10/27/2020   Procedure: LEFT TOTAL KNEE ARTHROPLASTY;  Surgeon: [name], MD;  Location: [facility];  Service: Orthopedics;  Laterality: Left;   TUBAL LIGATION     UPPER GASTROINTESTINAL ENDOSCOPY      FAMILY HISTORY: The patient family history includes COPD in her sister; Diabetes in her brother, maternal grandmother, mother, and sister; Heart disease in her sister; Heart disease (age of onset: 90) in her brother; Hyperlipidemia in her father; Hypertension in her father; Kidney disease  in her mother and sister; Stomach cancer in her maternal aunt; Stroke in her father; Sudden death in her father; Thyroid disease in her mother.  SOCIAL HISTORY:  The patient  reports that she quit smoking about 47 years ago. Her smoking use included cigarettes. She has a 0.25 pack-year smoking history. She has never used smokeless tobacco. She reports that she does not drink alcohol and does not use drugs.  REVIEW OF SYSTEMS: Review of Systems  Cardiovascular:  Negative for chest pain, dyspnea on exertion, leg swelling, near-syncope, orthopnea, palpitations, paroxysmal nocturnal dyspnea and syncope.  Respiratory:  Negative for shortness of breath.     PHYSICAL EXAM:    10/08/2021    2:02 PM 09/16/2021    3:12 PM 09/16/2021    3:11 PM  Vitals with BMI  Height $Remov'5\' 4"'SVzYeQ$     Weight 167 lbs    BMI 02.40    Systolic 973 532 992  Diastolic 62 58 67  Pulse 51 60 53    Physical Exam  Neck: No JVD  present.  Cardiovascular: Normal rate, regular rhythm, S1 normal, S2 normal, intact distal pulses and normal pulses. Exam reveals no gallop, no S3 and no S4.  No murmur heard. Pulses:      Dorsalis pedis pulses are 2+ on the right side and 2+ on the left side.       Posterior tibial pulses are 2+ on the right side and 2+ on the left side.  Pulmonary/Chest: Effort normal and breath sounds normal. No stridor. She has no wheezes. She has no rales. She exhibits no tenderness.  Abdominal: Soft. Bowel sounds are normal. She exhibits no distension. There is no abdominal tenderness.  Musculoskeletal:        General: No edema.    RADIOLOGY CTA PE protocol: June 25, 2021 1. No pulmonary embolus or acute intrathoracic abnormality. 2. Coronary artery calcifications. Aortic Atherosclerosis (ICD10-I70.0)  CARDIAC DATABASE: EKG: 07/09/2021: Normal sinus rhythm, 60 bpm, old anteroseptal infarct, TWI in lateral leads consider possible ischemia.   Echocardiogram: 07/15/2021:  Normal LV systolic function  with EF 63%. Left ventricle cavity is normal  in size. Mild concentric remodeling of the left ventricle. Normal global  wall motion. Normal diastolic filling pattern. Calculated EF 63%.  Left atrial cavity is mildly dilated by volume.  Structurally normal tricuspid valve.  Mild tricuspid regurgitation. No  evidence of pulmonary hypertension.   Stress Testing: 11/03/2018 [facility] available in Care Everywhere: Per report: No reversible ischemia or infarction, normal wall motion, calculated LVEF 84%, low risk study  CCTA: 07/22/2021 1. Total coronary calcium score of 341. This was 93rd percentile for age and sex matched control. 2. Normal coronary origin with co-dominance. 3. CAD-RADS = 3.  Left Main: Patent.   LAD: Mild stenosis (25-49%) at the proximal LAD due to calcified plaque. Moderate stenosis (50-69%) at mid LAD after the take off of second diagonal branch due to mixed eccentric plaque (lesion is about 2 cm in length). Mild stenosis (25-49%) due to calcified plaque within the distal and apical LAD. First Diagonal branch is patent. Second diagonal branch patent with calcified plaque at the mid-segment.   LCx: Patent.   RCA: Moderate stenosis (50-69%) at the ostial RCA due to eccentric mixed plaque and remainder of the vessel is patent.   4. Aortic atherosclerosis.   5. Study is sent for CT-FFR to further evaluate the RCA and LAD disease. Findings will be performed and reported separately.  6. No acute or significant incidental extracardiac findings in the chest.  CTFFR  May 2023: CT FFR analysis illustrates hemodynamically significant stenosis within the mid LAD (distal to second diagonal branch).  Heart Catheterization: 08/18/2021: LM: Normal LAD: Prox calcific 30% disease         Mid, focal, eccentric 70% stenosis Lcx: No significant disease RCA: Ostial 40% stenosis Patient opted medical therapy. In absence of any angina or angina  equivalent symptoms on excellent medical therapy, reasonable to continue the same at that time.  In future, if symptoms increase, could consider PCI to mid LAD.   LABORATORY DATA:    Latest Ref Rng & Units 08/11/2021    9:33 AM 06/25/2021    1:50 PM 12/23/2020    3:22 PM  CBC  WBC 3.4 - 10.8 x10E3/uL 3.4  4.5  5.1   Hemoglobin 11.1 - 15.9 g/dL 13.5  13.6  11.2   Hematocrit 34.0 - 46.6 % 40.8  40.9  34.9   Platelets 150 - 450 x10E3/uL 261  245  256.0        Latest Ref Rng & Units 09/16/2021    3:25 PM 08/11/2021    9:33 AM 06/25/2021    1:50 PM  CMP  Glucose 70 - 99 mg/dL 89  101  93   BUN 8 - 27 mg/dL 24  24  34   Creatinine 0.57 - 1.00 mg/dL 1.14  1.16  1.16   Sodium 134 - 144 mmol/L 138  139  138   Potassium 3.5 - 5.2 mmol/L 4.9  4.0  4.3   Chloride 96 - 106 mmol/L 104  102  107   CO2 20 - 29 mmol/L $RemoveB'21  22  25   'KhTHerID$ Calcium 8.7 - 10.3 mg/dL 9.2  9.9  9.5   Total Protein 6.5 - 8.1 g/dL   7.9   Total Bilirubin 0.3 - 1.2 mg/dL   0.3   Alkaline Phos 38 - 126 U/L   94   AST 15 - 41 U/L   47   ALT 0 - 44 U/L   37     Lipid Panel  Lab Results  Component Value Date   CHOL 156 11/20/2020   HDL 61 11/20/2020   LDLCALC 78 11/20/2020   TRIG 89 11/20/2020   CHOLHDL 2.6 11/20/2020     No components found for: "NTPROBNP" No results for input(s): "PROBNP" in the last 8760 hours. Recent Labs    06/17/21 1606  TSH 1.640    BMP Recent Labs    10/22/20 1430 10/28/20 0419 11/20/20 1132 06/25/21 1350 08/11/21 0933 09/16/21 1525  NA 138 134*   < > 138 139 138  K 3.9 3.6   < > 4.3 4.0 4.9  CL 106 102   < > 107 102 104  CO2 23 24   < > $R'25 22 21  'yq$ GLUCOSE 80 91   < > 93 101* 89  BUN 20 9   < > 34* 24 24  CREATININE 1.30* 1.00   < > 1.16* 1.16* 1.14*  CALCIUM 9.6 9.0   < > 9.5 9.9 9.2  GFRNONAA 45* >60  --  52*  --   --    < > = values in this interval not displayed.    HEMOGLOBIN A1C Lab Results  Component Value Date   HGBA1C 6.0 (H) 09/16/2021   MPG 134.11 10/22/2020     IMPRESSION:    ICD-10-CM   1. Atherosclerosis of native coronary artery of native heart without angina pectoris  I25.10 carvedilol (COREG) 3.125 MG tablet    2. Coronary atherosclerosis due to calcified coronary lesion  I25.10 carvedilol (COREG) 3.125 MG tablet   I25.84     3. Atherosclerosis of aorta (HCC)  I70.0     4. Type 2 diabetes mellitus with stage 3a chronic kidney disease, with long-term current use of insulin (HCC)  E11.22    N18.31    Z79.4     5. Benign hypertension  I10     6. Type 2 diabetes mellitus with hyperlipidemia [facility])  E11.69    E78.5        RECOMMENDATIONS: WINNA GOLLA is a 66 y.o. African-American female whose past medical history and cardiac risk factors include: CAD, Coronary artery calcification, aortic atherosclerosis, hypertension, hyperlipidemia, GERD, fatty liver disease, IDDM Type II, nephrolithiasis, advanced age.  Atherosclerosis of native coronary artery of native heart without angina pectoris Denies angina pectoris. No use of sublingual nitroglycerin tablets since last office visit Was not able to tolerate BiDiL  and feels fatigue on toprol xl 12.$RemoveBefo'5mg'zqFtCHmKwbN$  po qday.  Discontinue Toprol-XL. Start carvedilol 3.125 mg p.o. twice daily. Patient underwent a coronary CTA in May 2023 and was noted to have obstructive CAD in the LAD distribution which was reconfirmed by invasive angiography.  However she did not undergo coronary intervention at as part of a shared decision between the patient and interventional cardiologist Dr. Virgina Jock as if she was asymptomatic with up titration of antianginal therapy. Educated on the importance of secondary prevention with regards to blood pressure management, better lipid management and glycemic control. Educated on seeking medical attention sooner by going to the closest ER via EMS if the symptoms increase in intensity, frequency, duration, or has typical chest pain as discussed in the office.  Patient  verbalized understanding. Plan of care discussed with the husband who is present at bedside and son Oval Linsey whom I spoke to during the visit over the phone at the patient's request  Coronary atherosclerosis due to calcified coronary lesion / Atherosclerosis of aorta ([place]) Total CAC 341, 93rd percentile Continue aspirin and statin therapy. Secondary prevention  Type 2 diabetes mellitus with stage 3a chronic kidney disease, with long-term current use of insulin (HCC) A1c as of March 2023 well-controlled. Currently managed by primary care provider.  Benign hypertension Office blood pressures are not well controlled. Home blood pressures are improving. Did not tolerate BiDil and feels fatigued with Toprol-XL 12.5 mg p.o. daily We will transition to carvedilol as discussed above  Type 2 diabetes mellitus with hyperlipidemia (HCC) Continue rosuvastatin.  Does not endorse myalgias. Most recent lipid profile from September 2022 independently .  Discussed management of at least 2 chronic comorbid conditions, medication changes as discussed above, plan of care discussed with the patient's husband who was present at the office visit and son Oval Linsey whom I spoke to over the phone at the patient's request.  FINAL MEDICATION LIST END OF ENCOUNTER: Meds ordered this encounter  Medications   carvedilol (COREG) 3.125 MG tablet    Sig: Take 1 tablet (3.125 mg total) by mouth 2 (two) times daily with a meal.    Dispense:  60 tablet    Refill:  0    Medications Discontinued During This Encounter  Medication Reason   isosorbide-hydrALAZINE (BIDIL) 20-37.5 MG tablet Patient Preference   metoprolol succinate (TOPROL-XL) 25 MG 24 hr tablet Patient Preference      Current Outpatient Medications:    acetaminophen-codeine (TYLENOL #3) 300-30 MG tablet, Take 1 tablet by mouth daily as needed for moderate pain., Disp: 30 tablet, Rfl: 0   amLODipine (NORVASC) 10 MG tablet, TAKE ONE TABLET BY MOUTH ONCE  DAILY, Disp: 90 tablet, Rfl: 3   aspirin EC 81 MG tablet, Take 1 tablet (81 mg total) by mouth daily. Swallow whole., Disp: 150 tablet, Rfl: 2   Azilsartan Medoxomil (EDARBI) 80 MG TABS, TAKE ONE TABLET BY MOUTH ONCE DAILY, Disp: 90 tablet, Rfl: 2   Blood Glucose Monitoring Suppl (ONETOUCH VERIO) w/Device KIT, Use as directed to check blood sugars 2 times per day dx: e11.65, Disp: 1 kit, Rfl: 1   carvedilol (COREG) 3.125 MG tablet, Take 1 tablet (3.125 mg total) by mouth 2 (two) times daily with a meal., Disp: 60 tablet, Rfl: 0   celecoxib (CELEBREX) 200 MG capsule, Take 1 capsule (200 mg total) by mouth 2 (two) times daily as needed., Disp: 180 capsule, Rfl: 2   cetirizine (ZYRTEC) 10 MG tablet, Take 1 tablet (10 mg total) by mouth daily., Disp:  90 tablet, Rfl: 2   dapagliflozin propanediol (FARXIGA) 10 MG TABS tablet, Take 1 tablet (10 mg total) by mouth daily before breakfast., Disp: 90 tablet, Rfl: 1   ferrous gluconate (FERGON) 324 MG tablet, TAKE 1 TABLET BY MOUTH TWICE DAILY WITH A MEAL. (Patient taking differently: Take 324 mg by mouth 2 (two) times daily with a meal.), Disp: 100 tablet, Rfl: 0   glucose blood (ONETOUCH VERIO) test strip, Use as instructed to check blood sugars 2 times per day dx:e11.65, Disp: 150 each, Rfl: 3   insulin lispro (HUMALOG KWIKPEN) 100 UNIT/ML KwikPen, Sliding scale (Patient taking differently: Inject 4-12 Units into the skin 3 (three) times daily as needed (High blood sugar). Sliding scale), Disp: 30 mL, Rfl: 2   linaclotide ([name]) 145 MCG CAPS capsule, Take 1 capsule (145 mcg total) by mouth daily. (Patient taking differently: Take 145 mcg by mouth daily as needed (constipation).), Disp: 30 capsule, Rfl: 0   OneTouch Delica Lancets 67E MISC, Use as directed to check blood sugars 2 times per day dx:e11.65, Disp: 150 each, Rfl: 3   pantoprazole (PROTONIX) 40 MG tablet, Take 1 tablet (40 mg total) by mouth every morning., Disp: 90 tablet, Rfl: 2   rosuvastatin  (CRESTOR) 20 MG tablet, TAKE ONE TABLET BY MOUTH ONCE DAILY, Disp: 90 tablet, Rfl: 0   Semaglutide, 2 MG/DOSE, 8 MG/3ML SOPN, Inject 2 mg as directed once a week., Disp: 9 mL, Rfl: 0   temazepam (RESTORIL) 30 MG capsule, TAKE ONE CAPSULE BY MOUTH EVERYDAY AT BEDTIME AS NEEDED, Disp: 30 capsule, Rfl: 2   TRUEPLUS PEN NEEDLES 31G X 6 MM MISC, USE WITH pen TO INJECT insulin DAILY, Disp: 150 each, Rfl: 3   nitroGLYCERIN (NITROSTAT) 0.4 MG SL tablet, Place 1 tablet (0.4 mg total) under the tongue every 5 (five) minutes as needed for chest pain. If you require more than two tablets five minutes apart go to the nearest ER via EMS., Disp: 30 tablet, Rfl: 0  No orders of the defined types were placed in this encounter.   There are no Patient Instructions on file for this visit.   --Continue cardiac medications as reconciled in final medication list. --Return in about 3 months (around 01/08/2022) for Follow up, CAD. Or sooner if needed. --Continue follow-up with your primary care physician regarding the management of your other chronic comorbid conditions.  Patient's questions and concerns were addressed to her satisfaction. She voices understanding of the instructions provided during this encounter.   This note was created using a voice recognition software as a result there may be grammatical errors inadvertently enclosed that do not reflect the nature of this encounter. Every attempt is made to correct such errors.  Rex Kras, [place], [facility]  Pager: [phone] Office: [phone]

## 2021-10-14 ENCOUNTER — Ambulatory Visit (INDEPENDENT_AMBULATORY_CARE_PROVIDER_SITE_OTHER): Payer: Medicare Other | Admitting: Family Medicine

## 2021-10-14 ENCOUNTER — Encounter (INDEPENDENT_AMBULATORY_CARE_PROVIDER_SITE_OTHER): Payer: Self-pay | Admitting: Family Medicine

## 2021-10-14 ENCOUNTER — Other Ambulatory Visit: Payer: Self-pay | Admitting: Internal Medicine

## 2021-10-14 VITALS — BP 148/76 | HR 54 | Temp 98.0°F | Ht 64.0 in | Wt 163.0 lb

## 2021-10-14 DIAGNOSIS — E559 Vitamin D deficiency, unspecified: Secondary | ICD-10-CM | POA: Diagnosis not present

## 2021-10-14 DIAGNOSIS — Z7985 Long-term (current) use of injectable non-insulin antidiabetic drugs: Secondary | ICD-10-CM | POA: Diagnosis not present

## 2021-10-14 DIAGNOSIS — Z794 Long term (current) use of insulin: Secondary | ICD-10-CM | POA: Diagnosis not present

## 2021-10-14 DIAGNOSIS — E669 Obesity, unspecified: Secondary | ICD-10-CM | POA: Diagnosis not present

## 2021-10-14 DIAGNOSIS — E1159 Type 2 diabetes mellitus with other circulatory complications: Secondary | ICD-10-CM

## 2021-10-14 DIAGNOSIS — I152 Hypertension secondary to endocrine disorders: Secondary | ICD-10-CM | POA: Diagnosis not present

## 2021-10-14 DIAGNOSIS — Z1231 Encounter for screening mammogram for malignant neoplasm of breast: Secondary | ICD-10-CM

## 2021-10-14 DIAGNOSIS — Z6828 Body mass index (BMI) 28.0-28.9, adult: Secondary | ICD-10-CM

## 2021-10-14 MED ORDER — SEMAGLUTIDE (2 MG/DOSE) 8 MG/3ML ~~LOC~~ SOPN: 2.0000 mg | PEN_INJECTOR | SUBCUTANEOUS | 0 refills | Status: DC

## 2021-10-15 ENCOUNTER — Telehealth: Payer: Self-pay

## 2021-10-15 NOTE — Chronic Care Management (AMB) (Signed)
Medication Coordination:  [facility] is unable to get patient's Ozempic for delivery, there is a shortage again and on back order. [facility] has Ozempic on hand, [facility] will transfer prescription over, called patient to inform, she is aware, she will pick up Ozempic from [facility].  Pattricia Boss, [place] Pharmacist Assistant [phone]

## 2021-10-22 NOTE — Progress Notes (Signed)
Chief Complaint:   OBESITY Leslie Davis is here to discuss her progress with her obesity treatment plan along with follow-up of her obesity related diagnoses. Leslie Davis is on the Category 1 Plan with breakfast options and states she is following her eating plan approximately 90% of the time. Leslie Davis states she is walking and biking 60 minutes ? times per week.  Today's visit was #: 20 Starting weight: 208 lbs Starting date: 07/23/2020 Today's weight: 163 lbs Today's date: 10/14/2021 Total lbs lost to date: 45 Total lbs lost since last in-office visit: 2  Interim History: Leslie Davis reports hunger and cravings are very well controlled on Ozempic. She has no issues with meal plan.  Subjective:   1. Hypertension associated with type 2 diabetes mellitus (Leslie Davis) Pt was seen by cardiology in 10/08/2021. Due to fatigue, Toprol was discontinued and Coreg was started. Pt is no longer feeling fatigued/tired. Her BP at home runs 130-140/70's.  2. Type 2 diabetes mellitus with other circulatory complication, with long-term current use of insulin (Leslie Davis) Discussed labs with patient today. Pt's fasting blood sugars run 73-107. She has no high blood sugars or symptoms at all. Her A1c at the start of the program was worse at 8.7, and is now 5.7! Pt is on Ozempic 2 mg and tolerating it well with no side effects.   3. Vitamin D deficiency Discussed labs with patient today. Leslie Davis has not been on supplementation for 5 months. Her Vit D level is at gaol at 48.3.  Assessment/Plan:  No orders of the defined types were placed in this encounter.   Medications Discontinued During This Encounter  Medication Reason   acetaminophen-codeine (TYLENOL #3) 300-30 MG tablet    insulin lispro (HUMALOG KWIKPEN) 100 UNIT/ML KwikPen    Semaglutide, 2 MG/DOSE, 8 MG/3ML SOPN Reorder     Meds ordered this encounter  Medications   Semaglutide, 2 MG/DOSE, 8 MG/3ML SOPN    Sig: Inject 2 mg as directed once a week.     Dispense:  9 mL    Refill:  0     1. Hypertension associated with type 2 diabetes mellitus (Leslie Davis) Vonda is working on healthy weight loss and exercise to improve blood pressure control. We will watch for signs of hypotension as she continues her lifestyle modifications. Pt with CAD on CAT 07/2021 with obstruction to LAD artery. Decision to medically manage was made. No stent placed. Pt remains asymptomatic, with no angina, and is working out 2-3 days a week. BP is better controlled.  2. Type 2 diabetes mellitus with other circulatory complication, with long-term current use of insulin (Leslie Davis) Good blood sugar control is important to decrease the likelihood of diabetic complications such as nephropathy, neuropathy, limb loss, blindness, coronary artery disease, and death. Intensive lifestyle modification including diet, exercise and weight loss are the first line of treatment for diabetes. A1c under good control.  Refill- Semaglutide, 2 MG/DOSE, 8 MG/3ML SOPN; Inject 2 mg as directed once a week.  Dispense: 9 mL; Refill: 0  3. Vitamin D deficiency Low Vitamin D level contributes to fatigue and are associated with obesity, breast, and colon cancer. She agrees to continue to follow-up for routine testing of Vitamin D, at least 2-3 times per year to avoid over-replacement. No supplementation needed. Pt aware of need for bone density exam and mammogram. Pt will call for appts. Continue exercise.  4. Obesity with current BMI of 28.1 Leslie Davis is currently in the action stage of change. As such, her goal  is to continue with weight loss efforts. She has agreed to the Category 1 Plan with breakfast options.   Exercise goals: For substantial health benefits, adults should do at least 150 minutes (2 hours and 30 minutes) a week of moderate-intensity, or 75 minutes (1 hour and 15 minutes) a week of vigorous-intensity aerobic physical activity, or an equivalent combination of moderate- and vigorous-intensity  aerobic activity. Aerobic activity should be performed in episodes of at least 10 minutes, and preferably, it should be spread throughout the week.  Behavioral modification strategies: increasing lean protein intake and decreasing simple carbohydrates.  Leslie Davis has agreed to follow-up with our clinic in 8 weeks. She was informed of the importance of frequent follow-up visits to maximize her success with intensive lifestyle modifications for her multiple health conditions.   Objective:   Blood pressure (!) 148/76, pulse (!) 54, temperature 98 F (36.7 C), height 5' 4"  (1.626 m), weight 163 lb (73.9 kg), SpO2 100 %. Body mass index is 27.98 kg/m.  General: Cooperative, alert, well developed, in no acute distress. HEENT: Conjunctivae and lids unremarkable. Cardiovascular: Regular rhythm.  Lungs: Normal work of breathing. Neurologic: No focal deficits.   Lab Results  Component Value Date   CREATININE 1.14 (H) 09/16/2021   BUN 24 09/16/2021   NA 138 09/16/2021   K 4.9 09/16/2021   CL 104 09/16/2021   CO2 21 09/16/2021   Lab Results  Component Value Date   ALT 37 06/25/2021   AST 47 (H) 06/25/2021   ALKPHOS 94 06/25/2021   BILITOT 0.3 06/25/2021   Lab Results  Component Value Date   HGBA1C 6.0 (H) 09/16/2021   HGBA1C 5.7 (H) 09/14/2021   HGBA1C 6.2 (H) 05/28/2021   HGBA1C 6.1 (H) 02/26/2021   HGBA1C 6.3 (H) 10/22/2020   No results found for: "INSULIN" Lab Results  Component Value Date   TSH 1.640 06/17/2021   Lab Results  Component Value Date   CHOL 156 11/20/2020   HDL 61 11/20/2020   LDLCALC 78 11/20/2020   TRIG 89 11/20/2020   CHOLHDL 2.6 11/20/2020   Lab Results  Component Value Date   VD25OH 48.3 09/14/2021   VD25OH 82.1 03/31/2021   VD25OH 102.0 (H) 11/20/2020   Lab Results  Component Value Date   WBC 3.4 08/11/2021   HGB 13.5 08/11/2021   HCT 40.8 08/11/2021   MCV 88 08/11/2021   PLT 261 08/11/2021   Lab Results  Component Value Date   IRON 68  11/16/2017   TIBC 355 11/16/2017   FERRITIN 45.2 11/16/2017    Obesity Behavioral Intervention:   Approximately 15 minutes were spent on the discussion below.  ASK: We discussed the diagnosis of obesity with Leslie Davis today and Leslie Davis agreed to give [place] permission to discuss obesity behavioral modification therapy today.  ASSESS: Leslie Davis has the diagnosis of obesity and her BMI today is 28.1. Leslie Davis is in the action stage of change.   ADVISE: Leslie Davis was educated on the multiple health risks of obesity as well as the benefit of weight loss to improve her health. She was advised of the need for long term treatment and the importance of lifestyle modifications to improve her current health and to decrease her risk of future health problems.  AGREE: Multiple dietary modification options and treatment options were discussed and Leslie Davis agreed to follow the recommendations documented in the above note.  ARRANGE: Leslie Davis was educated on the importance of frequent visits to treat obesity as outlined per CMS and USPSTF  guidelines and agreed to schedule her next follow up appointment today.  Attestation Statements:   Reviewed by clinician on day of visit: allergies, medications, problem list, medical history, surgical history, family history, social history, and previous encounter notes.  I, Kathlene November, BS, CMA, am acting as transcriptionist for [employer], DO.  I have reviewed the above documentation for accuracy and completeness, and I agree with the above. Marjory Sneddon, D.O.  The [employer] was signed into law in 2016 which includes the topic of electronic health records.  This provides immediate access to information in MyChart.  This includes consultation notes, operative notes, office notes, lab results and pathology reports.  If you have any questions about what you read please let us know at your next visit so we can discuss your concerns and take corrective  action if need be.  We are right here with you.

## 2021-10-23 ENCOUNTER — Ambulatory Visit
Admission: RE | Admit: 2021-10-23 | Discharge: 2021-10-23 | Disposition: A | Discharge status: Home / Self Care | Payer: Medicare Other | Source: Ambulatory Visit | Attending: Internal Medicine | Admitting: Internal Medicine

## 2021-10-23 DIAGNOSIS — Z1231 Encounter for screening mammogram for malignant neoplasm of breast: Secondary | ICD-10-CM

## 2021-10-27 ENCOUNTER — Other Ambulatory Visit: Payer: Self-pay | Admitting: Internal Medicine

## 2021-10-27 DIAGNOSIS — R928 Other abnormal and inconclusive findings on diagnostic imaging of breast: Secondary | ICD-10-CM

## 2021-10-29 ENCOUNTER — Other Ambulatory Visit: Payer: Self-pay | Admitting: Cardiology

## 2021-10-29 ENCOUNTER — Ambulatory Visit (AMBULATORY_SURGERY_CENTER): Payer: Self-pay

## 2021-10-29 VITALS — Ht 64.0 in | Wt 172.0 lb

## 2021-10-29 DIAGNOSIS — Z1211 Encounter for screening for malignant neoplasm of colon: Secondary | ICD-10-CM

## 2021-10-29 DIAGNOSIS — I10 Essential (primary) hypertension: Secondary | ICD-10-CM

## 2021-10-29 DIAGNOSIS — I2510 Atherosclerotic heart disease of native coronary artery without angina pectoris: Secondary | ICD-10-CM

## 2021-10-29 NOTE — Progress Notes (Signed)
No egg or soy allergy known to patient   No issues known to pt with past sedation with any surgeries or procedures  Patient denies ever being told they had issues or difficulty with intubation   No FH of Malignant Hyperthermia  Pt is not on diet pills  Pt is not on  home 02   Pt is not on blood thinners   Pt HAS HAD issues with constipation in the past and takes ex-lax and/or linzess occasionally she states, instructed pt to take miralax one capful daily for 5 days before the procedure.  No A fib or A flutter  Have any cardiac testing pending--no  Pt instructed to use Singlecare.com or GoodRx for a price reduction on prep

## 2021-11-03 ENCOUNTER — Other Ambulatory Visit: Payer: Self-pay | Admitting: Internal Medicine

## 2021-11-03 ENCOUNTER — Ambulatory Visit
Admission: RE | Admit: 2021-11-03 | Discharge: 2021-11-03 | Disposition: A | Discharge status: Home / Self Care | Payer: Medicare Other | Source: Ambulatory Visit | Attending: Internal Medicine | Admitting: Internal Medicine

## 2021-11-03 ENCOUNTER — Ambulatory Visit: Payer: Medicare Other | Admitting: Orthopaedic Surgery

## 2021-11-03 DIAGNOSIS — R928 Other abnormal and inconclusive findings on diagnostic imaging of breast: Secondary | ICD-10-CM

## 2021-11-03 DIAGNOSIS — R921 Mammographic calcification found on diagnostic imaging of breast: Secondary | ICD-10-CM | POA: Diagnosis not present

## 2021-11-09 ENCOUNTER — Other Ambulatory Visit: Payer: Self-pay | Admitting: Cardiology

## 2021-11-09 DIAGNOSIS — I2510 Atherosclerotic heart disease of native coronary artery without angina pectoris: Secondary | ICD-10-CM

## 2021-11-12 ENCOUNTER — Other Ambulatory Visit: Payer: Self-pay | Admitting: Internal Medicine

## 2021-11-12 ENCOUNTER — Ambulatory Visit
Admission: RE | Admit: 2021-11-12 | Discharge: 2021-11-12 | Disposition: A | Discharge status: Home / Self Care | Payer: Medicare Other | Source: Ambulatory Visit | Attending: Internal Medicine | Admitting: Internal Medicine

## 2021-11-12 ENCOUNTER — Encounter: Payer: Self-pay | Admitting: Gastroenterology

## 2021-11-12 ENCOUNTER — Telehealth: Payer: Self-pay

## 2021-11-12 DIAGNOSIS — D242 Benign neoplasm of left breast: Secondary | ICD-10-CM | POA: Diagnosis not present

## 2021-11-12 DIAGNOSIS — R921 Mammographic calcification found on diagnostic imaging of breast: Secondary | ICD-10-CM

## 2021-11-12 DIAGNOSIS — N6012 Diffuse cystic mastopathy of left breast: Secondary | ICD-10-CM | POA: Diagnosis not present

## 2021-11-12 NOTE — Chronic Care Management (AMB) (Unsigned)
Chronic Care Management Pharmacy Assistant   Name: Leslie Davis  MRN: [id number] DOB: [birth year]  Reason for Encounter: Disease State/ Diabetes  Recent office visits:  09-16-2021 [name], MD. Creatinine= 1.14, eGFR= 53. A1C= 6.0. STOP toujeo.   Recent consult visits:  10-29-2021 Etheleen Nicks, RN Gertie Fey). Colonoscopy completed.  10-14-2021 [name], DO (Weight management). STOP tylenol #3 and humalog.  10-08-2021 Rex Kras, DO (Cardiology). START carvedilol 3.125 mg twice daily. STOP Bidil and metoprolol.   09-14-2021 [name], DO (weight management). A1C= 5.7.   09-09-2021 Girtha Rm, PT (Physical therapy). Therapy on left knee.   09-07-2021 Rex Kras, DO (Cardiology). Visit for post catheterization. START BIDIL 20-37.5 mg 3 times daily.    09-02-2021 Girtha Rm, PT (Physical therapy). Therapy on left knee.   08-27-2021 Girtha Rm, PT (Physical therapy). Therapy on left knee.   08-18-2021 Nigel Mormon, MD (Cardiology). LEFT HEART CATH AND CORONARY ANGIOGRAPHY procedure completed.   08-12-2021 Girtha Rm, PT (Physical therapy). Therapy on left knee.   08-10-2021 [name], DO (Weight management). Follow up visit no changes.   08-05-2021 Girtha Rm, PT (Physical therapy). Therapy on left knee.   08-04-2021 [name], MD (Orthopedic surgery). Follow up on total left knee replacement. XR knee 3 view left completed.   08-03-2021 Rex Kras, DO (Cardiology). Glucose= 101, Creatinine= 1.16, eGFR= 52. START metoprolol 25 mg daily and nitrostat 0.4 mg Place 1 tablet under the tongue every 5 (five) minutes as needed for chest pain. If you require more than two tablets five minutes apart go to the nearest ER via EMS. STOP robaxin.   07-29-2021 Girtha Rm, PT (Physical therapy). Therapy on left knee.   07-22-2021 Laureen Abrahams, PT (Physical therapy). Therapy on left knee.    07-14-2021 [name], DO (Weight management). STOP chlorthalidone.   07-09-2021 Rex Kras, DO (Cardiology). STOP celebrex, advil and tramadol. START metoprolol 25 mg twice daily for 7 days Start two days before your CT Scan.   07-07-2021 Girtha Rm, PT (Physical therapy). Initial visit for therapy on left knee.   07-01-2021 Aundra Dubin, PA-C (Orthopedic surgery). Referral placed to physical therapy. XR Knee 1-2 Views Left completed. START acetaminophen-codeine 300-30 mg every 8 hours as needed and celebrex 200 mg twice daily as needed.  Hospital visits:  Medication Reconciliation was completed by comparing discharge summary, patient's EMR and Pharmacy list, and upon discussion with patient.   Admitted to the hospital on 06-25-2021 due to shortness of breath. Discharge date was 06-25-2021. Discharged from [place]?Medications Started at [facility] Discharge:?? None   Medication Changes at Hospital Discharge: None   Medications Discontinued at Hospital Discharge: None   Medications that remain the same after Hospital Discharge:??  -All other medications will remain the same.    Medications: Outpatient Encounter Medications as of 11/12/2021  Medication Sig   amLODipine (NORVASC) 10 MG tablet TAKE ONE TABLET BY MOUTH ONCE DAILY   aspirin EC 81 MG tablet Take 1 tablet (81 mg total) by mouth daily. Swallow whole.   Azilsartan Medoxomil (EDARBI) 80 MG TABS TAKE ONE TABLET BY MOUTH ONCE DAILY   Blood Glucose Monitoring Suppl (ONETOUCH VERIO) w/Device KIT Use as directed to check blood sugars 2 times per day dx: e11.65   carvedilol (COREG) 3.125 MG tablet TAKE ONE TABLET BY MOUTH TWICE DAILY WITH A MEAL   celecoxib (CELEBREX) 200 MG capsule Take 1 capsule (  200 mg total) by mouth 2 (two) times daily as needed.   cetirizine (ZYRTEC) 10 MG tablet Take 1 tablet (10 mg total) by mouth daily.   dapagliflozin propanediol (FARXIGA) 10 MG TABS tablet Take 1  tablet (10 mg total) by mouth daily before breakfast.   ferrous gluconate (FERGON) 324 MG tablet TAKE 1 TABLET BY MOUTH TWICE DAILY WITH A MEAL. (Patient taking differently: Take 324 mg by mouth 2 (two) times daily with a meal.)   glucose blood (ONETOUCH VERIO) test strip Use as instructed to check blood sugars 2 times per day dx:e11.65   linaclotide ([name]) 145 MCG CAPS capsule Take 1 capsule (145 mcg total) by mouth daily. (Patient taking differently: Take 145 mcg by mouth daily as needed (constipation).)   nitroGLYCERIN (NITROSTAT) 0.4 MG SL tablet Place 1 tablet (0.4 mg total) under the tongue every 5 (five) minutes as needed for chest pain. If you require more than two tablets five minutes apart go to the nearest ER via EMS.   OneTouch Delica Lancets 32D MISC Use as directed to check blood sugars 2 times per day dx:e11.65   pantoprazole (PROTONIX) 40 MG tablet Take 1 tablet (40 mg total) by mouth every morning.   polyethylene glycol (MIRALAX / GLYCOLAX) 17 g packet Take 17 g by mouth daily as needed. Take one dose daily for 5 days before the colonoscopy   polyethylene glycol powder (GLYCOLAX/MIRALAX) 17 GM/SCOOP powder Take 1 Container by mouth once. Colonoscopy prep   rosuvastatin (CRESTOR) 20 MG tablet TAKE ONE TABLET BY MOUTH ONCE DAILY   Semaglutide, 2 MG/DOSE, 8 MG/3ML SOPN Inject 2 mg as directed once a week.   Sennosides (EX-LAX PO) Take by mouth.   temazepam (RESTORIL) 30 MG capsule TAKE ONE CAPSULE BY MOUTH EVERYDAY AT BEDTIME AS NEEDED   TRUEPLUS PEN NEEDLES 31G X 6 MM MISC USE WITH pen TO INJECT insulin DAILY   No facility-administered encounter medications on file as of 11/12/2021.  Recent Relevant Labs: Lab Results  Component Value Date/Time   HGBA1C 6.0 (H) 09/16/2021 03:25 PM   HGBA1C 5.7 (H) 09/14/2021 02:38 PM   MICROALBUR 150 11/15/2019 10:20 AM    Kidney Function Lab Results  Component Value Date/Time   CREATININE 1.14 (H) 09/16/2021 03:25 PM   CREATININE 1.16 (H)  08/11/2021 09:33 AM   CREATININE 0.74 08/30/2011 01:07 PM   GFR 47.43 (L) 12/23/2020 03:22 PM   GFRNONAA 52 (L) 06/25/2021 01:50 PM   GFRAA 57 (L) 02/21/2020 03:00 PM    Current antihyperglycemic regimen:  *** What recent interventions/DTPs have been made to improve glycemic control:  *** Have there been any recent hospitalizations or ED visits since last visit with [facility]? {yes/no:20286} Patient {reports/denies:24182} hypoglycemic symptoms, including {Hypoglycemic Symptoms:3049003} Patient {reports/denies:24182} hyperglycemic symptoms, including {symptoms; hyperglycemia:17903} How often are you checking your blood sugar? {BG Testing frequency:23922} What are your blood sugars ranging?  Fasting: *** Before meals: *** After meals: *** Bedtime: *** During the week, how often does your blood glucose drop below 70? {LowBGfrequency:24142} Are you checking your feet daily/regularly?   Adherence Review: Is the patient currently on a STATIN medication? Yes Is the patient currently on ACE/ARB medication? Yes Does the patient have >5 day gap between last estimated fill dates? No  11-12-2021: 1st attempt left VM 11-16-2021: 2nd attempt left VM  Care Gaps: Shingrix overdue Colonoscopy overdue Flu vaccine overdue  Star Rating Drugs: Farxiga 10 mg- Last filled 09-24-2021 90 [facility] Rosuvastatin 20 mg- Last filled 09-24-2021 90  [facility] Ozempic 2 mg- Last filled 10-27-2021 28 [facility] Edarbi 80 mg- Last filled 09-24-2021 90 [facility]  Seba Dalkai Clinical Pharmacist Assistant [phone]

## 2021-11-24 ENCOUNTER — Ambulatory Visit (AMBULATORY_SURGERY_CENTER): Payer: Medicare Other | Admitting: Gastroenterology

## 2021-11-24 ENCOUNTER — Encounter: Payer: Self-pay | Admitting: Gastroenterology

## 2021-11-24 VITALS — BP 178/88 | HR 52 | Temp 96.8°F | Resp 17 | Ht 64.0 in | Wt 172.0 lb

## 2021-11-24 DIAGNOSIS — D127 Benign neoplasm of rectosigmoid junction: Secondary | ICD-10-CM | POA: Diagnosis not present

## 2021-11-24 DIAGNOSIS — D123 Benign neoplasm of transverse colon: Secondary | ICD-10-CM | POA: Diagnosis not present

## 2021-11-24 DIAGNOSIS — D124 Benign neoplasm of descending colon: Secondary | ICD-10-CM

## 2021-11-24 DIAGNOSIS — I10 Essential (primary) hypertension: Secondary | ICD-10-CM | POA: Diagnosis not present

## 2021-11-24 DIAGNOSIS — E119 Type 2 diabetes mellitus without complications: Secondary | ICD-10-CM | POA: Diagnosis not present

## 2021-11-24 DIAGNOSIS — D120 Benign neoplasm of cecum: Secondary | ICD-10-CM

## 2021-11-24 DIAGNOSIS — Z1211 Encounter for screening for malignant neoplasm of colon: Secondary | ICD-10-CM

## 2021-11-24 DIAGNOSIS — K760 Fatty (change of) liver, not elsewhere classified: Secondary | ICD-10-CM | POA: Diagnosis not present

## 2021-11-24 MED ORDER — SODIUM CHLORIDE 0.9 % IV SOLN: 500.0000 mL | Freq: Once | INTRAVENOUS | Status: DC

## 2021-11-24 NOTE — Progress Notes (Signed)
PT taken to PACU. Monitors in place. VSS. Report given to RN. 

## 2021-11-24 NOTE — Patient Instructions (Signed)
Use fiberCon 1-2 tablets twice a day.  Read all of the handouts given to you by your recovery room nurse.  Please, take your blood pressure medicine after you eat.  YOU HAD AN ENDOSCOPIC PROCEDURE TODAY AT [facility]:   Refer to the procedure report that was given to you for any specific questions about what was found during the examination.  If the procedure report does not answer your questions, please call your gastroenterologist to clarify.  If you requested that your care partner not be given the details of your procedure findings, then the procedure report has been included in a sealed envelope for you to review at your convenience later.  YOU SHOULD EXPECT: Some feelings of bloating in the abdomen. Passage of more gas than usual.  Walking can help get rid of the air that was put into your GI tract during the procedure and reduce the bloating. If you had a lower endoscopy (such as a colonoscopy or flexible sigmoidoscopy) you may notice spotting of blood in your stool or on the toilet paper. If you underwent a bowel prep for your procedure, you may not have a normal bowel movement for a few days.  Please Note:  You might notice some irritation and congestion in your nose or some drainage.  This is from the oxygen used during your procedure.  There is no need for concern and it should clear up in a day or so.  SYMPTOMS TO REPORT IMMEDIATELY:  Following lower endoscopy (colonoscopy or flexible sigmoidoscopy):  Excessive amounts of blood in the stool  Significant tenderness or worsening of abdominal pains  Swelling of the abdomen that is new, acute  Fever of 100F or higher   For urgent or emergent issues, a gastroenterologist can be reached at any hour by calling [phone]. Do not use MyChart messaging for urgent concerns.    DIET:  We do recommend a small meal at first, but then you may proceed to your regular diet.  Drink plenty of fluids but you should avoid  alcoholic beverages for 24 hours.  ACTIVITY:  You should plan to take it easy for the rest of today and you should NOT DRIVE or use heavy machinery until tomorrow (because of the sedation medicines used during the test).    FOLLOW UP: Our staff will call the number listed on your records the next business day following your procedure.  We will call around 7:15- 8:00 am to check on you and address any questions or concerns that you may have regarding the information given to you following your procedure. If we do not reach you, we will leave a message.  If you develop any symptoms (ie: fever, flu-like symptoms, shortness of breath, cough etc.) before then, please call [phone].  If you test positive for Covid 19 in the 2 weeks post procedure, please call and report this information to [place].    If any biopsies were taken you will be contacted by phone or by letter within the next 1-3 weeks.  Please call us at [phone] if you have not heard about the biopsies in 3 weeks.    SIGNATURES/CONFIDENTIALITY: You and/or your care partner have signed paperwork which will be entered into your electronic medical record.  These signatures attest to the fact that that the information above on your After Visit Summary has been reviewed and is understood.  Full responsibility of the confidentiality of this discharge information lies with you and/or your care-partner.

## 2021-11-24 NOTE — Progress Notes (Signed)
Called to room to assist during endoscopic procedure.  Patient ID and intended procedure confirmed with present staff. Received instructions for my participation in the procedure from the performing physician.  

## 2021-11-24 NOTE — Op Note (Signed)
[facility] Patient Name: Leslie Davis Procedure Date: 11/24/2021 10:44 AM MRN: [id number] Endoscopist: Justice Britain , MD Age: 66 Referring MD:  Date of Birth: November 02, 1955 Gender: Female Account #: [id number] Procedure:                Colonoscopy Indications:              Screening for colorectal malignant neoplasm Medicines:                Monitored Anesthesia Care Procedure:                Pre-Anesthesia Assessment:                           - Prior to the procedure, a History and Physical                            was performed, and patient medications and                            allergies were reviewed. The patient's tolerance of                            previous anesthesia was also reviewed. The risks                            and benefits of the procedure and the sedation                            options and risks were discussed with the patient.                            All questions were answered, and informed consent                            was obtained. Prior Anticoagulants: The patient has                            taken no previous anticoagulant or antiplatelet                            agents except for aspirin. ASA Grade Assessment:                            III - A patient with severe systemic disease. After                            reviewing the risks and benefits, the patient was                            deemed in satisfactory condition to undergo the                            procedure.  After obtaining informed consent, the colonoscope                            was passed under direct vision. Throughout the                            procedure, the patient's blood pressure, pulse, and                            oxygen saturations were monitored continuously. The                            CF [id number] #[id number] was introduced through the anus                            and advanced to the 5 cm into the  ileum. The                            colonoscopy was performed without difficulty. The                            patient tolerated the procedure. The quality of the                            bowel preparation was good. The terminal ileum,                            ileocecal valve, appendiceal orifice, and rectum                            were photographed. Scope In: 10:59:59 AM Scope Out: 11:17:42 AM Scope Withdrawal Time: 0 hours 14 minutes 35 seconds  Total Procedure Duration: 0 hours 17 minutes 43 seconds  Findings:                 The digital rectal exam findings include                            hemorrhoids. Pertinent negatives include no                            palpable rectal lesions.                           The terminal ileum and ileocecal valve appeared                            normal.                           Six sessile polyps were found in the recto-sigmoid                            colon, descending colon, transverse colon and  cecum. The polyps were 2 to 8 mm in size. These                            polyps were removed with a cold snare. Resection                            and retrieval were complete.                           Multiple small-mouthed diverticula were found in                            the ascending colon.                           Normal mucosa was found in the entire colon                            otherwise.                           Non-bleeding non-thrombosed internal hemorrhoids                            were found during retroflexion, during perianal                            exam and during digital exam. The hemorrhoids were                            Grade II (internal hemorrhoids that prolapse but                            reduce spontaneously). Complications:            No immediate complications. Estimated Blood Loss:     Estimated blood loss was minimal. Impression:               - Hemorrhoids  found on digital rectal exam.                           - The examined portion of the ileum was normal.                           - Six 2 to 8 mm polyps at the recto-sigmoid colon,                            in the descending colon, in the transverse colon                            and in the cecum, removed with a cold snare.                            Resected and retrieved.                           -  Diverticulosis in the ascending colon.                           - Normal mucosa in the entire examined colon                            otherwise.                           - Non-bleeding non-thrombosed internal hemorrhoids. Recommendation:           - The patient will be observed post-procedure,                            until all discharge criteria are met.                           - Discharge patient to home.                           - Patient has a contact number available for                            emergencies. The signs and symptoms of potential                            delayed complications were discussed with the                            patient. Return to normal activities tomorrow.                            Written discharge instructions were provided to the                            patient.                           - High fiber diet.                           - Use FiberCon 1-2 tablets PO daily.                           - Continue present medications.                           - Await pathology results.                           - Repeat colonoscopy in 3/5/7 years for                            surveillance based on pathology results.                           - The findings and recommendations were discussed  with the patient.                           - The findings and recommendations were discussed                            with the patient's family. Justice Britain, MD 11/24/2021 11:22:39 AM

## 2021-11-24 NOTE — Progress Notes (Signed)
GASTROENTEROLOGY PROCEDURE H&P NOTE   Primary Care Physician: [name], MD  HPI: Leslie Davis is a 66 y.o. female who presents for colonoscopy for screening.  Past Medical History:  Diagnosis Date   DM (diabetes mellitus) ([place])    Fatty liver disease, nonalcoholic    Gastritis    Gastroparesis    GERD (gastroesophageal reflux disease)    Heartburn    Hiatal hernia    HLD (hyperlipidemia)    HTN (hypertension)    Joint pain    Renal calculus    Rheumatoid arthritis ([place])    Vitamin D deficiency    Past Surgical History:  Procedure Laterality Date   CHOLECYSTECTOMY     COLONOSCOPY  09/10/2011   normal   LEFT HEART CATH AND CORONARY ANGIOGRAPHY N/A 08/18/2021   Procedure: LEFT HEART CATH AND CORONARY ANGIOGRAPHY;  Surgeon: Nigel Mormon, MD;  Location: [facility];  Service: Cardiovascular;  Laterality: N/A;   TOTAL KNEE ARTHROPLASTY Left 10/27/2020   Procedure: LEFT TOTAL KNEE ARTHROPLASTY;  Surgeon: [name], MD;  Location: [facility];  Service: Orthopedics;  Laterality: Left;   TUBAL LIGATION     UPPER GASTROINTESTINAL ENDOSCOPY     Current Outpatient Medications  Medication Sig Dispense Refill   amLODipine (NORVASC) 10 MG tablet TAKE ONE TABLET BY MOUTH ONCE DAILY 90 tablet 3   aspirin EC 81 MG tablet Take 1 tablet (81 mg total) by mouth daily. Swallow whole. 150 tablet 2   Azilsartan Medoxomil (EDARBI) 80 MG TABS TAKE ONE TABLET BY MOUTH ONCE DAILY 90 tablet 2   Blood Glucose Monitoring Suppl (ONETOUCH VERIO) w/Device KIT Use as directed to check blood sugars 2 times per day dx: e11.65 1 kit 1   carvedilol (COREG) 3.125 MG tablet TAKE ONE TABLET BY MOUTH TWICE DAILY WITH A MEAL 60 tablet 0   celecoxib (CELEBREX) 200 MG capsule Take 1 capsule (200 mg total) by mouth 2 (two) times daily as needed. 180 capsule 2   cetirizine (ZYRTEC) 10 MG tablet Take 1 tablet (10 mg total) by mouth daily. 90 tablet 2   dapagliflozin propanediol (FARXIGA) 10  MG TABS tablet Take 1 tablet (10 mg total) by mouth daily before breakfast. 90 tablet 1   ferrous gluconate (FERGON) 324 MG tablet TAKE 1 TABLET BY MOUTH TWICE DAILY WITH A MEAL. (Patient taking differently: Take 324 mg by mouth 2 (two) times daily with a meal.) 100 tablet 0   glucose blood (ONETOUCH VERIO) test strip Use as instructed to check blood sugars 2 times per day dx:e11.65 150 each 3   linaclotide ([name]) 145 MCG CAPS capsule Take 1 capsule (145 mcg total) by mouth daily. (Patient taking differently: Take 145 mcg by mouth daily as needed (constipation).) 30 capsule 0   nitroGLYCERIN (NITROSTAT) 0.4 MG SL tablet Place 1 tablet (0.4 mg total) under the tongue every 5 (five) minutes as needed for chest pain. If you require more than two tablets five minutes apart go to the nearest ER via EMS. 30 tablet 0   OneTouch Delica Lancets 17E MISC Use as directed to check blood sugars 2 times per day dx:e11.65 150 each 3   pantoprazole (PROTONIX) 40 MG tablet Take 1 tablet (40 mg total) by mouth every morning. 90 tablet 2   polyethylene glycol (MIRALAX / GLYCOLAX) 17 g packet Take 17 g by mouth daily as needed. Take one dose daily for 5 days before the colonoscopy     polyethylene glycol  powder (GLYCOLAX/MIRALAX) 17 GM/SCOOP powder Take 1 Container by mouth once. Colonoscopy prep     rosuvastatin (CRESTOR) 20 MG tablet TAKE ONE TABLET BY MOUTH ONCE DAILY 90 tablet 0   Semaglutide, 2 MG/DOSE, 8 MG/3ML SOPN Inject 2 mg as directed once a week. 9 mL 0   Sennosides (EX-LAX PO) Take by mouth.     temazepam (RESTORIL) 30 MG capsule TAKE ONE CAPSULE BY MOUTH EVERYDAY AT BEDTIME AS NEEDED 30 capsule 2   TRUEPLUS PEN NEEDLES 31G X 6 MM MISC USE WITH pen TO INJECT insulin DAILY 150 each 3   No current facility-administered medications for this visit.    Current Outpatient Medications:    amLODipine (NORVASC) 10 MG tablet, TAKE ONE TABLET BY MOUTH ONCE DAILY, Disp: 90 tablet, Rfl: 3   aspirin EC 81 MG  tablet, Take 1 tablet (81 mg total) by mouth daily. Swallow whole., Disp: 150 tablet, Rfl: 2   Azilsartan Medoxomil (EDARBI) 80 MG TABS, TAKE ONE TABLET BY MOUTH ONCE DAILY, Disp: 90 tablet, Rfl: 2   Blood Glucose Monitoring Suppl (ONETOUCH VERIO) w/Device KIT, Use as directed to check blood sugars 2 times per day dx: e11.65, Disp: 1 kit, Rfl: 1   carvedilol (COREG) 3.125 MG tablet, TAKE ONE TABLET BY MOUTH TWICE DAILY WITH A MEAL, Disp: 60 tablet, Rfl: 0   celecoxib (CELEBREX) 200 MG capsule, Take 1 capsule (200 mg total) by mouth 2 (two) times daily as needed., Disp: 180 capsule, Rfl: 2   cetirizine (ZYRTEC) 10 MG tablet, Take 1 tablet (10 mg total) by mouth daily., Disp: 90 tablet, Rfl: 2   dapagliflozin propanediol (FARXIGA) 10 MG TABS tablet, Take 1 tablet (10 mg total) by mouth daily before breakfast., Disp: 90 tablet, Rfl: 1   ferrous gluconate (FERGON) 324 MG tablet, TAKE 1 TABLET BY MOUTH TWICE DAILY WITH A MEAL. (Patient taking differently: Take 324 mg by mouth 2 (two) times daily with a meal.), Disp: 100 tablet, Rfl: 0   glucose blood (ONETOUCH VERIO) test strip, Use as instructed to check blood sugars 2 times per day dx:e11.65, Disp: 150 each, Rfl: 3   linaclotide ([name]) 145 MCG CAPS capsule, Take 1 capsule (145 mcg total) by mouth daily. (Patient taking differently: Take 145 mcg by mouth daily as needed (constipation).), Disp: 30 capsule, Rfl: 0   nitroGLYCERIN (NITROSTAT) 0.4 MG SL tablet, Place 1 tablet (0.4 mg total) under the tongue every 5 (five) minutes as needed for chest pain. If you require more than two tablets five minutes apart go to the nearest ER via EMS., Disp: 30 tablet, Rfl: 0   OneTouch Delica Lancets 01K MISC, Use as directed to check blood sugars 2 times per day dx:e11.65, Disp: 150 each, Rfl: 3   pantoprazole (PROTONIX) 40 MG tablet, Take 1 tablet (40 mg total) by mouth every morning., Disp: 90 tablet, Rfl: 2   polyethylene glycol (MIRALAX / GLYCOLAX) 17 g packet,  Take 17 g by mouth daily as needed. Take one dose daily for 5 days before the colonoscopy, Disp: , Rfl:    polyethylene glycol powder (GLYCOLAX/MIRALAX) 17 GM/SCOOP powder, Take 1 Container by mouth once. Colonoscopy prep, Disp: , Rfl:    rosuvastatin (CRESTOR) 20 MG tablet, TAKE ONE TABLET BY MOUTH ONCE DAILY, Disp: 90 tablet, Rfl: 0   Semaglutide, 2 MG/DOSE, 8 MG/3ML SOPN, Inject 2 mg as directed once a week., Disp: 9 mL, Rfl: 0   Sennosides (EX-LAX PO), Take by mouth., Disp: , Rfl:  temazepam (RESTORIL) 30 MG capsule, TAKE ONE CAPSULE BY MOUTH EVERYDAY AT BEDTIME AS NEEDED, Disp: 30 capsule, Rfl: 2   TRUEPLUS PEN NEEDLES 31G X 6 MM MISC, USE WITH pen TO INJECT insulin DAILY, Disp: 150 each, Rfl: 3 Allergies  Allergen Reactions   Fiasp [Insulin Aspart (W-Niacinamide)] Hives   Family History  Problem Relation Age of Onset   Diabetes Mother    Thyroid disease Mother    Kidney disease Mother    Hypertension Father    Hyperlipidemia Father    Sudden death Father    Stroke Father    Diabetes Sister    Heart disease Sister    Kidney disease Sister    COPD Sister    Heart disease Brother 14   Diabetes Brother    Stomach cancer Maternal Aunt        X 2 aunts   Diabetes Maternal Grandmother    Colon cancer Neg Hx    Esophageal cancer Neg Hx    Rectal cancer Neg Hx    Colon polyps Neg Hx    Social History   Socioeconomic History   Marital status: Married    Spouse name: Kahdijah Errickson   Number of children: 2   Years of education: Not on file   Highest education level: Not on file  Occupational History   Occupation: Housekeeping    Comment: Estate [employer]  Tobacco Use   Smoking status: Former    Packs/day: 0.25    Years: 1.00    Total pack years: 0.25    Types: Cigarettes    Quit date: 09/10/1974    Years since quitting: 47.2   Smokeless tobacco: Never   Tobacco comments:    she no longer smokes.   Vaping Use   Vaping Use: Never used  Substance and Sexual  Activity   Alcohol use: Never   Drug use: Never   Sexual activity: Not Currently    Birth control/protection: Post-menopausal  Other Topics Concern   Not on file  Social History Narrative   Lives with husband.  Part time job.    Social Determinants of Health   Financial Resource Strain: Low Risk  (12/17/2020)   Overall Financial Resource Strain (CARDIA)    Difficulty of Paying Living Expenses: Not hard at all  Food Insecurity: No Food Insecurity (12/17/2020)   Hunger Vital Sign    Worried About Running Out of Food in the Last Year: Never true    Ran Out of Food in the Last Year: Never true  Transportation Needs: No Transportation Needs (12/17/2020)   PRAPARE - Hydrologist (Medical): No    Lack of Transportation (Non-Medical): No  Physical Activity: Inactive (12/17/2020)   Exercise Vital Sign    Days of Exercise per Week: 0 days    Minutes of Exercise per Session: 0 min  Stress: No Stress Concern Present (12/17/2020)   [place]    Feeling of Stress : Not at all  Social Connections: Not on file  Intimate Partner Violence: Not on file    Physical Exam: There were no vitals filed for this visit. There is no height or weight on file to calculate BMI. GEN: NAD EYE: Sclerae anicteric ENT: MMM CV: Non-tachycardic GI: Soft, NT/ND NEURO:  Alert & Oriented x 3  Lab Results: No results for input(s): "WBC", "HGB", "HCT", "PLT" in the last 72 hours. BMET No results for input(s): "NA", "K", "CL", "  CO2", "GLUCOSE", "BUN", "CREATININE", "CALCIUM" in the last 72 hours. LFT No results for input(s): "PROT", "ALBUMIN", "AST", "ALT", "ALKPHOS", "BILITOT", "BILIDIR", "IBILI" in the last 72 hours. PT/INR No results for input(s): "LABPROT", "INR" in the last 72 hours.   Impression / Plan: This is a 66 y.o.female who presents for colonoscopy for screening.  The risks and benefits of endoscopic  evaluation/treatment were discussed with the patient and/or family; these include but are not limited to the risk of perforation, infection, bleeding, missed lesions, lack of diagnosis, severe illness requiring hospitalization, as well as anesthesia and sedation related illnesses.  The patient's history has been reviewed, patient examined, no change in status, and deemed stable for procedure.  The patient and/or family is agreeable to proceed.    Justice Britain, MD [facility] Office # [phone]

## 2021-11-24 NOTE — Progress Notes (Signed)
VS completed by CW.   Pt's states no medical or surgical changes since previsit or office visit.  

## 2021-11-25 ENCOUNTER — Telehealth: Payer: Self-pay | Admitting: *Deleted

## 2021-11-25 NOTE — Telephone Encounter (Signed)
  Follow up Call-     11/24/2021   10:13 AM  Call back number  Post procedure Call Back phone  # [phone]  Permission to leave phone message Yes     Patient questions:  Do you have a fever, pain , or abdominal swelling? No. Pain Score  0 *  Have you tolerated food without any problems? Yes.    Have you been able to return to your normal activities? Yes.    Do you have any questions about your discharge instructions: Diet   No. Medications  No. Follow up visit  No.  Do you have questions or concerns about your Care? No.  Actions: * If pain score is 4 or above: No action needed, pain <4.

## 2021-12-01 ENCOUNTER — Ambulatory Visit: Payer: Self-pay | Admitting: General Surgery

## 2021-12-01 ENCOUNTER — Encounter: Payer: Self-pay | Admitting: Gastroenterology

## 2021-12-01 DIAGNOSIS — D242 Benign neoplasm of left breast: Secondary | ICD-10-CM

## 2021-12-02 ENCOUNTER — Ambulatory Visit (INDEPENDENT_AMBULATORY_CARE_PROVIDER_SITE_OTHER): Payer: Medicare Other | Admitting: Internal Medicine

## 2021-12-02 ENCOUNTER — Encounter: Payer: Self-pay | Admitting: Internal Medicine

## 2021-12-02 VITALS — BP 120/74 | Temp 97.8°F | Ht 63.0 in | Wt 165.8 lb

## 2021-12-02 DIAGNOSIS — N1831 Chronic kidney disease, stage 3a: Secondary | ICD-10-CM | POA: Diagnosis not present

## 2021-12-02 DIAGNOSIS — D242 Benign neoplasm of left breast: Secondary | ICD-10-CM | POA: Diagnosis not present

## 2021-12-02 DIAGNOSIS — I1310 Hypertensive heart and chronic kidney disease without heart failure, with stage 1 through stage 4 chronic kidney disease, or unspecified chronic kidney disease: Secondary | ICD-10-CM | POA: Diagnosis not present

## 2021-12-02 DIAGNOSIS — G8929 Other chronic pain: Secondary | ICD-10-CM | POA: Diagnosis not present

## 2021-12-02 DIAGNOSIS — Z79899 Other long term (current) drug therapy: Secondary | ICD-10-CM | POA: Diagnosis not present

## 2021-12-02 DIAGNOSIS — Z2821 Immunization not carried out because of patient refusal: Secondary | ICD-10-CM

## 2021-12-02 DIAGNOSIS — Z6829 Body mass index (BMI) 29.0-29.9, adult: Secondary | ICD-10-CM

## 2021-12-02 DIAGNOSIS — M25562 Pain in left knee: Secondary | ICD-10-CM

## 2021-12-02 DIAGNOSIS — E1122 Type 2 diabetes mellitus with diabetic chronic kidney disease: Secondary | ICD-10-CM

## 2021-12-02 DIAGNOSIS — Z794 Long term (current) use of insulin: Secondary | ICD-10-CM

## 2021-12-02 DIAGNOSIS — Z0000 Encounter for general adult medical examination without abnormal findings: Secondary | ICD-10-CM

## 2021-12-02 LAB — POCT URINALYSIS DIPSTICK
Bilirubin, UA: NEGATIVE
Blood, UA: NEGATIVE
Glucose, UA: POSITIVE — AB
Ketones, UA: NEGATIVE
Leukocytes, UA: NEGATIVE
Nitrite, UA: NEGATIVE
Protein, UA: POSITIVE — AB
Spec Grav, UA: 1.025 (ref 1.010–1.025)
Urobilinogen, UA: 0.2 E.U./dL
pH, UA: 6 (ref 5.0–8.0)

## 2021-12-02 NOTE — Progress Notes (Signed)
[name],acting as a Education administrator for [name], MD.,have documented all relevant documentation on the behalf of [name], MD,as directed by  [name], MD while in the presence of [name], MD.   Subjective:     Patient ID: Leslie Davis , female    DOB: 1955-10-15 , 66 y.o.   MRN: [id number]   Chief Complaint  Patient presents with   Annual Exam   Diabetes   Hypertension    HPI  Patient here for HM. Patient is followed by GYN, Dr. Martin[place] for her pelvic exams. She reports compliance with meds. She denies headaches, chest pain and shortness of breath.   She recently had left breast bx x 2 due to abnormality seen on mammogram. Pathology significant for fibrotic intraductal papilloma w/ calcifications and fibrotic changes w/ calcifications.   Diabetes She presents for her follow-up diabetic visit. She has type 2 diabetes mellitus. Her disease course has been stable. There are no hypoglycemic associated symptoms. Pertinent negatives for diabetes include no blurred vision. There are no hypoglycemic complications. Diabetic complications include nephropathy. Risk factors for coronary artery disease include diabetes mellitus, dyslipidemia, hypertension and post-menopausal.  Hypertension This is a chronic problem. The current episode started more than 1 year ago. The problem has been gradually improving since onset. Pertinent negatives include no blurred vision. Risk factors for coronary artery disease include dyslipidemia and diabetes mellitus. The current treatment provides moderate improvement.     Past Medical History:  Diagnosis Date   DM (diabetes mellitus) ([place])    Fatty liver disease, nonalcoholic    Gastritis    Gastroparesis    GERD (gastroesophageal reflux disease)    Heartburn    Hiatal hernia    HLD (hyperlipidemia)    HTN (hypertension)    Joint pain    Renal calculus    Rheumatoid arthritis ([place])    Vitamin D deficiency       Family History  Problem Relation Age of Onset   Diabetes Mother    Thyroid disease Mother    Kidney disease Mother    Hypertension Father    Hyperlipidemia Father    Sudden death Father    Stroke Father    Diabetes Sister    Heart disease Sister    Kidney disease Sister    COPD Sister    Heart disease Brother 32   Diabetes Brother    Stomach cancer Maternal Aunt        X 2 aunts   Diabetes Maternal Grandmother    Colon cancer Neg Hx    Esophageal cancer Neg Hx    Rectal cancer Neg Hx    Colon polyps Neg Hx      Current Outpatient Medications:    amLODipine (NORVASC) 10 MG tablet, TAKE ONE TABLET BY MOUTH ONCE DAILY, Disp: 90 tablet, Rfl: 3   aspirin EC 81 MG tablet, Take 1 tablet (81 mg total) by mouth daily. Swallow whole., Disp: 150 tablet, Rfl: 2   Azilsartan Medoxomil (EDARBI) 80 MG TABS, TAKE ONE TABLET BY MOUTH ONCE DAILY, Disp: 90 tablet, Rfl: 2   Blood Glucose Monitoring Suppl (ONETOUCH VERIO) w/Device KIT, Use as directed to check blood sugars 2 times per day dx: e11.65, Disp: 1 kit, Rfl: 1   celecoxib (CELEBREX) 200 MG capsule, Take 1 capsule (200 mg total) by mouth 2 (two) times daily as needed., Disp: 180 capsule, Rfl: 2   cetirizine (ZYRTEC) 10 MG tablet, Take 1 tablet (10  mg total) by mouth daily., Disp: 90 tablet, Rfl: 2   dapagliflozin propanediol (FARXIGA) 10 MG TABS tablet, Take 1 tablet (10 mg total) by mouth daily before breakfast., Disp: 90 tablet, Rfl: 1   ferrous gluconate (FERGON) 324 MG tablet, TAKE 1 TABLET BY MOUTH TWICE DAILY WITH A MEAL. (Patient taking differently: Take 324 mg by mouth 2 (two) times daily with a meal.), Disp: 100 tablet, Rfl: 0   glucose blood (ONETOUCH VERIO) test strip, Use as instructed to check blood sugars 2 times per day dx:e11.65, Disp: 150 each, Rfl: 3   linaclotide ([name]) 145 MCG CAPS capsule, Take 1 capsule (145 mcg total) by mouth daily. (Patient taking differently: Take 145 mcg by mouth daily as needed  (constipation).), Disp: 30 capsule, Rfl: 0   nitroGLYCERIN (NITROSTAT) 0.4 MG SL tablet, Place 1 tablet (0.4 mg total) under the tongue every 5 (five) minutes as needed for chest pain. If you require more than two tablets five minutes apart go to the nearest ER via EMS., Disp: 30 tablet, Rfl: 0   OneTouch Delica Lancets 19F MISC, Use as directed to check blood sugars 2 times per day dx:e11.65, Disp: 150 each, Rfl: 3   pantoprazole (PROTONIX) 40 MG tablet, Take 1 tablet (40 mg total) by mouth every morning., Disp: 90 tablet, Rfl: 2   polyethylene glycol (MIRALAX / GLYCOLAX) 17 g packet, Take 17 g by mouth daily as needed. Take one dose daily for 5 days before the colonoscopy, Disp: , Rfl:    rosuvastatin (CRESTOR) 20 MG tablet, TAKE ONE TABLET BY MOUTH ONCE DAILY, Disp: 90 tablet, Rfl: 0   Semaglutide, 2 MG/DOSE, 8 MG/3ML SOPN, Inject 2 mg as directed once a week., Disp: 9 mL, Rfl: 0   Sennosides (EX-LAX PO), Take by mouth., Disp: , Rfl:    temazepam (RESTORIL) 30 MG capsule, TAKE ONE CAPSULE BY MOUTH EVERYDAY AT BEDTIME AS NEEDED, Disp: 30 capsule, Rfl: 2   TRUEPLUS PEN NEEDLES 31G X 6 MM MISC, USE WITH pen TO INJECT insulin DAILY, Disp: 150 each, Rfl: 3   carvedilol (COREG) 3.125 MG tablet, TAKE ONE TABLET BY MOUTH TWICE DAILY WITH A MEAL, Disp: 60 tablet, Rfl: 0   Allergies  Allergen Reactions   Fiasp [Insulin Aspart (W-Niacinamide)] Hives      The patient states she uses post menopausal status for birth control. Last LMP was No LMP recorded. Patient is postmenopausal.. Negative for Dysmenorrhea. Negative for: breast discharge, breast lump(s), breast pain and breast self exam. Associated symptoms include abnormal vaginal bleeding. Pertinent negatives include abnormal bleeding (hematology), anxiety, decreased libido, depression, difficulty falling sleep, dyspareunia, history of infertility, nocturia, sexual dysfunction, sleep disturbances, urinary incontinence, urinary urgency, vaginal discharge  and vaginal itching. Diet regular.The patient states her exercise level is  intermittent.  . The patient's tobacco use is:  Social History   Tobacco Use  Smoking Status Former   Packs/day: 0.25   Years: 1.00   Total pack years: 0.25   Types: Cigarettes   Quit date: 09/10/1974   Years since quitting: 47.2  Smokeless Tobacco Never  Tobacco Comments   she no longer smokes.   . She has been exposed to passive smoke. The patient's alcohol use is:  Social History   Substance and Sexual Activity  Alcohol Use Never   Review of Systems  Constitutional: Negative.   HENT: Negative.    Eyes: Negative.  Negative for blurred vision.  Respiratory: Negative.    Cardiovascular: Negative.   Gastrointestinal: Negative.  Endocrine: Negative.   Genitourinary: Negative.   Musculoskeletal:  Positive for arthralgias.       C/o knee pain. Denies fall/trauma. Had TKR on left  Skin: Negative.   Allergic/Immunologic: Negative.   Neurological: Negative.   Hematological: Negative.   Psychiatric/Behavioral: Negative.       Today's Vitals   12/02/21 1005  BP: 120/74  Temp: 97.8 F (36.6 C)  Weight: 165 lb 12.8 oz (75.2 kg)  Height: 5' 3"  (1.6 m)  PainSc: 0-No pain   Body mass index is 29.37 kg/m.  Wt Readings from Last 3 Encounters:  12/09/21 163 lb (73.9 kg)  12/02/21 165 lb 12.8 oz (75.2 kg)  11/24/21 172 lb (78 kg)     Objective:  Physical Exam Vitals and nursing note reviewed.  Constitutional:      Appearance: Normal appearance.  HENT:     Head: Normocephalic and atraumatic.     Right Ear: Tympanic membrane, ear canal and external ear normal.     Left Ear: Tympanic membrane, ear canal and external ear normal.     Nose: Nose normal.     Mouth/Throat:     Mouth: Mucous membranes are moist.     Pharynx: Oropharynx is clear.  Eyes:     Extraocular Movements: Extraocular movements intact.     Conjunctiva/sclera: Conjunctivae normal.     Pupils: Pupils are equal, round, and  reactive to light.  Cardiovascular:     Rate and Rhythm: Normal rate and regular rhythm.     Pulses: Normal pulses.          Dorsalis pedis pulses are 2+ on the right side and 2+ on the left side.     Heart sounds: Normal heart sounds.  Pulmonary:     Effort: Pulmonary effort is normal.     Breath sounds: Normal breath sounds.  Chest:  Breasts:    Right: Normal.     Comments: LEFT BREAST NOT PALPATED DUE TO PATIENT REQUEST, RECENTLY HAD TWO BREAST BIOPSIES, BANDAGES IN PLACE Abdominal:     General: Abdomen is flat. Bowel sounds are normal.     Palpations: Abdomen is soft.  Genitourinary:    Comments: deferred Musculoskeletal:        General: Normal range of motion.     Cervical back: Normal range of motion and neck supple.     Comments: HEALED SURGICAL SCAR LEFT KNEE  Feet:     Right foot:     Protective Sensation: 5 sites tested.  5 sites sensed.     Skin integrity: Skin integrity normal.     Toenail Condition: Right toenails are normal.     Left foot:     Protective Sensation: 5 sites tested.  5 sites sensed.     Skin integrity: Skin integrity normal.     Toenail Condition: Left toenails are normal.  Skin:    General: Skin is warm and dry.  Neurological:     General: No focal deficit present.     Mental Status: She is alert and oriented to person, place, and time.  Psychiatric:        Mood and Affect: Mood normal.        Behavior: Behavior normal.      Assessment And Plan:     1. Encounter for general adult medical examination w/o abnormal findings Comments: A full exam was performed. Importance of monthly self breast exams was discussed with the patient. PATIENT IS ADVISED TO GET 30-45 MINUTES REGULAR EXERCISE NO  LESS THAN FOUR TO FIVE DAYS PER WEEK - BOTH WEIGHTBEARING EXERCISES AND AEROBIC ARE RECOMMENDED.  PATIENT IS ADVISED TO FOLLOW A HEALTHY DIET WITH AT LEAST SIX FRUITS/VEGGIES PER DAY, DECREASE INTAKE OF RED MEAT, AND TO INCREASE FISH INTAKE TO TWO DAYS PER  WEEK.  MEATS/FISH SHOULD NOT BE FRIED, BAKED OR BROILED IS PREFERABLE.  IT IS ALSO IMPORTANT TO CUT BACK ON YOUR SUGAR INTAKE. PLEASE AVOID ANYTHING WITH ADDED SUGAR, CORN SYRUP OR OTHER SWEETENERS. IF YOU MUST USE A SWEETENER, YOU CAN TRY STEVIA. IT IS ALSO IMPORTANT TO AVOID ATIFICIALLY SWEETENERS AND DIET BEVERAGES. LASTLY, I SUGGEST WEARING SPF 50 SUNSCREEN ON EXPOSED PARTS AND ESPECIALLY WHEN IN THE DIRECT SUNLIGHT FOR AN EXTENDED PERIOD OF TIME.  PLEASE AVOID FAST FOOD RESTAURANTS AND INCREASE YOUR WATER INTAKE.  2. Type 2 diabetes mellitus with stage 3a chronic kidney disease, with long-term current use of insulin (HCC) Comments: Diabetic foot exam was performed. Goal a1c<6.5%. Encouraged to follow dietary recommendations. She will f/u in 3-4 months. I DISCUSSED WITH THE PATIENT AT LENGTH REGARDING THE GOALS OF GLYCEMIC CONTROL AND POSSIBLE LONG-TERM COMPLICATIONS.  I  ALSO STRESSED THE IMPORTANCE OF COMPLIANCE WITH HOME GLUCOSE MONITORING, DIETARY RESTRICTIONS INCLUDING AVOIDANCE OF SUGARY DRINKS/PROCESSED FOODS,  ALONG WITH REGULAR EXERCISE.  I  ALSO STRESSED THE IMPORTANCE OF ANNUAL EYE EXAMS, SELF FOOT CARE AND COMPLIANCE WITH OFFICE VISITS.  - POCT Urinalysis Dipstick (81002) - Microalbumin / Creatinine Urine Ratio - CBC - CMP14+EGFR - Lipid panel - Hemoglobin A1c - Protein electrophoresis, serum - Parathyroid Hormone, Intact w/Ca - Phosphorus  3. Hypertensive heart and renal disease with renal failure, stage 1 through stage 4 or unspecified chronic kidney disease, without heart failure Comments: Chronic, well controlled. She will c/w amlodipine 45m, Edarbi 851mnad carvedilol 3.12526mwice daily. Encouraged to follow low sodium diet.  - CBC - CMP14+EGFR  4. Intraductal papilloma of breast, left Comments: Mammogram and pathology reports reviewed.   5. Chronic pain of left knee Comments: She is s/p left TKR. She is encouraged to follow anti-inflammatory diet and continue with  exercises she learned in PT.   6. BMI 29.0-29.9,adult Comments: She was congratulated on maintaining her weight loss. Encouraged to aim for at least 150 minutes of exercise per week.   7. Drug therapy - Vitamin B12  8. Herpes zoster vaccination declined  9. Influenza vaccination declined  Patient was given opportunity to ask questions. Patient verbalized understanding of the plan and was able to repeat key elements of the plan. All questions were answered to their satisfaction.   I, RobMaximino GreenlandD, have reviewed all documentation for this visit. The documentation on 12/02/21 for the exam, diagnosis, procedures, and orders are all accurate and complete.   THE PATIENT IS ENCOURAGED TO PRACTICE SOCIAL DISTANCING DUE TO THE COVID-19 PANDEMIC.

## 2021-12-02 NOTE — Patient Instructions (Signed)

## 2021-12-07 LAB — PROTEIN ELECTROPHORESIS, SERUM
A/G Ratio: 1.1 (ref 0.7–1.7)
Albumin ELP: 3.5 g/dL (ref 2.9–4.4)
Alpha 1: 0.2 g/dL (ref 0.0–0.4)
Alpha 2: 0.8 g/dL (ref 0.4–1.0)
Beta: 1 g/dL (ref 0.7–1.3)
Gamma Globulin: 1.3 g/dL (ref 0.4–1.8)
Globulin, Total: 3.3 g/dL (ref 2.2–3.9)

## 2021-12-07 LAB — LIPID PANEL
Chol/HDL Ratio: 1.9 ratio (ref 0.0–4.4)
Cholesterol, Total: 159 mg/dL (ref 100–199)
HDL: 85 mg/dL (ref >39)
LDL Chol Calc (NIH): 62 mg/dL (ref 0–99)
Triglycerides: 57 mg/dL (ref 0–149)
VLDL Cholesterol Cal: 12 mg/dL (ref 5–40)

## 2021-12-07 LAB — CBC
Hematocrit: 38.7 % (ref 34.0–46.6)
Hemoglobin: 13.1 g/dL (ref 11.1–15.9)
MCH: 29 pg (ref 26.6–33.0)
MCHC: 33.9 g/dL (ref 31.5–35.7)
MCV: 86 fL (ref 79–97)
Platelets: 268 10*3/uL (ref 150–450)
RBC: 4.51 x10E6/uL (ref 3.77–5.28)
RDW: 13.2 % (ref 11.7–15.4)
WBC: 4.7 10*3/uL (ref 3.4–10.8)

## 2021-12-07 LAB — HEMOGLOBIN A1C
Est. average glucose Bld gHb Est-mCnc: 128 mg/dL
Hgb A1c MFr Bld: 6.1 % — ABNORMAL HIGH (ref 4.8–5.6)

## 2021-12-07 LAB — MICROALBUMIN / CREATININE URINE RATIO
Creatinine, Urine: 81.2 mg/dL
Microalb/Creat Ratio: 289 mg/g creat — ABNORMAL HIGH (ref 0–29)
Microalbumin, Urine: 234.4 ug/mL

## 2021-12-07 LAB — PTH, INTACT AND CALCIUM: PTH: 29 pg/mL (ref 15–65)

## 2021-12-07 LAB — CMP14+EGFR
ALT: 24 IU/L (ref 0–32)
AST: 19 IU/L (ref 0–40)
Albumin/Globulin Ratio: 1.6 (ref 1.2–2.2)
Albumin: 4.2 g/dL (ref 3.9–4.9)
Alkaline Phosphatase: 127 IU/L — ABNORMAL HIGH (ref 44–121)
BUN/Creatinine Ratio: 15 (ref 12–28)
BUN: 21 mg/dL (ref 8–27)
Bilirubin Total: <0.2 mg/dL (ref 0.0–1.2)
CO2: 19 mmol/L — ABNORMAL LOW (ref 20–29)
Calcium: 9.5 mg/dL (ref 8.7–10.3)
Chloride: 104 mmol/L (ref 96–106)
Creatinine, Ser: 1.42 mg/dL — ABNORMAL HIGH (ref 0.57–1.00)
Globulin, Total: 2.6 g/dL (ref 1.5–4.5)
Glucose: 92 mg/dL (ref 70–99)
Potassium: 4.7 mmol/L (ref 3.5–5.2)
Sodium: 136 mmol/L (ref 134–144)
Total Protein: 6.8 g/dL (ref 6.0–8.5)
eGFR: 40 mL/min/{1.73_m2} — ABNORMAL LOW (ref >59)

## 2021-12-07 LAB — VITAMIN B12: Vitamin B-12: 628 pg/mL (ref 232–1245)

## 2021-12-07 LAB — PHOSPHORUS: Phosphorus: 4.1 mg/dL (ref 3.0–4.3)

## 2021-12-09 ENCOUNTER — Ambulatory Visit (INDEPENDENT_AMBULATORY_CARE_PROVIDER_SITE_OTHER): Payer: Medicare Other | Admitting: Family Medicine

## 2021-12-09 ENCOUNTER — Encounter (INDEPENDENT_AMBULATORY_CARE_PROVIDER_SITE_OTHER): Payer: Self-pay | Admitting: Family Medicine

## 2021-12-09 VITALS — BP 140/77 | HR 62 | Temp 97.6°F | Ht 63.0 in | Wt 163.0 lb

## 2021-12-09 DIAGNOSIS — F430 Acute stress reaction: Secondary | ICD-10-CM

## 2021-12-09 DIAGNOSIS — Z6835 Body mass index (BMI) 35.0-35.9, adult: Secondary | ICD-10-CM

## 2021-12-09 DIAGNOSIS — E1169 Type 2 diabetes mellitus with other specified complication: Secondary | ICD-10-CM

## 2021-12-09 DIAGNOSIS — Z794 Long term (current) use of insulin: Secondary | ICD-10-CM

## 2021-12-09 DIAGNOSIS — E669 Obesity, unspecified: Secondary | ICD-10-CM | POA: Diagnosis not present

## 2021-12-09 DIAGNOSIS — D242 Benign neoplasm of left breast: Secondary | ICD-10-CM

## 2021-12-09 DIAGNOSIS — Z7985 Long-term (current) use of injectable non-insulin antidiabetic drugs: Secondary | ICD-10-CM

## 2021-12-09 DIAGNOSIS — Z6829 Body mass index (BMI) 29.0-29.9, adult: Secondary | ICD-10-CM

## 2021-12-10 ENCOUNTER — Other Ambulatory Visit: Payer: Self-pay | Admitting: Cardiology

## 2021-12-10 ENCOUNTER — Other Ambulatory Visit: Payer: Self-pay | Admitting: Physician Assistant

## 2021-12-10 DIAGNOSIS — I2510 Atherosclerotic heart disease of native coronary artery without angina pectoris: Secondary | ICD-10-CM

## 2021-12-12 NOTE — Progress Notes (Unsigned)
Chief Complaint:   OBESITY Leslie Davis is here to discuss her progress with her obesity treatment plan along with follow-up of her obesity related diagnoses. Leslie Davis is on the Category 1 Plan with breakfast option and states she is following her eating plan approximately 90% of the time. Leslie Davis states she is biking, walking, and doing leg exercises for 90 minutes 2-3 times per week.  Today's visit was #: 21 Starting weight: 208 lbs Starting date: 07/23/2020 Today's weight: 163 lbs Today's date: 12/09/2021 Total lbs lost to date: 45 lbs Total lbs lost since last in-office visit: 0 lbs  Interim History: [name]'s last office visit was approximately 2 months ago. She did well overall and endorses 90% adherence to meal plan. However, stressful event with breast biopsy and the need for surgical removal of breast mass has caused and increase in her fat mass and a decrease in her muscle mass.  Subjective:   1. Papilloma of left breast, new diagnosis Leslie Davis has a new diagnosis of Papilloma of her left breast. Reviewed the notes from [facility] with her today.  2. Acute reaction to stress Leslie Davis is worried about the new mass on her left breast.  3. Type 2 diabetes mellitus with other specified complication, with long-term current use of insulin (Leslie Davis) Reviewed current level of A1c = 6.1, fasting blood sugar = 100-110 or a little lower, but stable, seven days ago with her PCP. She is currently only taking Ozempic and reports to be asymptomatic.  Assessment/Plan:  No orders of the defined types were placed in this encounter.   There are no discontinued medications.   No orders of the defined types were placed in this encounter.    1. Papilloma of left breast, new diagnosis Discussed with Leslie Davis that she will need Cardiac clearance for surgery, per surgeon's office visit notes from 12/01/2021. She is to call her cardiologist for appointment for the cardiac clearance.  2.  Acute reaction to stress Leslie Davis declines need for any medications. We discussed stress management techniques. She will also continue walking, praying at church, etc to help ease her mind.  3. Type 2 diabetes mellitus with other specified complication, with long-term current use of insulin (Leslie Davis) Leslie Davis will continue with her Personal Nutritional Plan, weight loss, and taking Ozempic.  4. Obesity with current BMI of 86 Leslie Davis is currently in the action stage of change. As such, her goal is to continue with weight loss efforts. She has agreed to the Category 1 Plan with breakfast options daily. Will increase exercise by adding 20 minutes of walking on her "off days".  Exercise goals: As is, will increase by adding 20 minutes of walking on her "off days".  Behavioral modification strategies: increasing lean protein intake, decreasing simple carbohydrates, and planning for success.  Leslie Davis has agreed to follow-up with our clinic in 5-6 weeks. She was informed of the importance of frequent follow-up visits to maximize her success with intensive lifestyle modifications for her multiple health conditions.   Objective:   Blood pressure (!) 140/77, pulse 62, temperature 97.6 F (36.4 C), height 5' 3"  (1.6 m), weight 163 lb (73.9 kg), SpO2 99 %. Body mass index is 28.87 kg/m.  General: Cooperative, alert, well developed, in no acute distress. HEENT: Conjunctivae and lids unremarkable. Cardiovascular: Regular rhythm.  Lungs: Normal work of breathing. Neurologic: No focal deficits.   Lab Results  Component Value Date   CREATININE 1.42 (H) 12/02/2021   BUN 21 12/02/2021   NA 136  12/02/2021   K 4.7 12/02/2021   CL 104 12/02/2021   CO2 19 (L) 12/02/2021   Lab Results  Component Value Date   ALT 24 12/02/2021   AST 19 12/02/2021   ALKPHOS 127 (H) 12/02/2021   BILITOT <0.2 12/02/2021   Lab Results  Component Value Date   HGBA1C 6.1 (H) 12/02/2021   HGBA1C 6.0 (H) 09/16/2021   HGBA1C  5.7 (H) 09/14/2021   HGBA1C 6.2 (H) 05/28/2021   HGBA1C 6.1 (H) 02/26/2021   No results found for: "INSULIN" Lab Results  Component Value Date   TSH 1.640 06/17/2021   Lab Results  Component Value Date   CHOL 159 12/02/2021   HDL 85 12/02/2021   LDLCALC 62 12/02/2021   TRIG 57 12/02/2021   CHOLHDL 1.9 12/02/2021   Lab Results  Component Value Date   VD25OH 48.3 09/14/2021   VD25OH 82.1 03/31/2021   VD25OH 102.0 (H) 11/20/2020   Lab Results  Component Value Date   WBC 4.7 12/02/2021   HGB 13.1 12/02/2021   HCT 38.7 12/02/2021   MCV 86 12/02/2021   PLT 268 12/02/2021   Lab Results  Component Value Date   IRON 68 11/16/2017   TIBC 355 11/16/2017   FERRITIN 45.2 11/16/2017    Obesity Behavioral Intervention:   Approximately 15 minutes were spent on the discussion below.  ASK: We discussed the diagnosis of obesity with Leslie Davis today and Leslie Davis agreed to give [place] permission to discuss obesity behavioral modification therapy today.  ASSESS: Leslie Davis has the diagnosis of obesity and her BMI today is 29. Leslie Davis is in the action stage of change.   ADVISE: Leslie Davis was educated on the multiple health risks of obesity as well as the benefit of weight loss to improve her health. She was advised of the need for long term treatment and the importance of lifestyle modifications to improve her current health and to decrease her risk of future health problems.  AGREE: Multiple dietary modification options and treatment options were discussed and Leslie Davis agreed to follow the recommendations documented in the above note.  ARRANGE: Leslie Davis was educated on the importance of frequent visits to treat obesity as outlined per CMS and USPSTF guidelines and agreed to schedule her next follow up appointment today.  Attestation Statements:   Reviewed by clinician on day of visit: allergies, medications, problem list, medical history, surgical history, family history, social history, and  previous encounter notes.  Time spent on visit including pre-visit chart review and post-visit care and charting was 40 minutes.   Leslie Davis, CMA, am acting as transcriptionist for Dr. Raliegh Scarlet, DO   I have reviewed the above documentation for accuracy and completeness, and I agree with the above. Marjory Sneddon, D.O.  The [place] was signed into law in 2016 which includes the topic of electronic health records.  This provides immediate access to information in MyChart.  This includes consultation notes, operative notes, office notes, lab results and pathology reports.  If you have any questions about what you read please let us know at your next visit so we can discuss your concerns and take corrective action if need be.  We are right here with you.

## 2021-12-17 ENCOUNTER — Ambulatory Visit (INDEPENDENT_AMBULATORY_CARE_PROVIDER_SITE_OTHER): Payer: Medicare Other | Admitting: Orthopaedic Surgery

## 2021-12-17 ENCOUNTER — Ambulatory Visit (INDEPENDENT_AMBULATORY_CARE_PROVIDER_SITE_OTHER): Payer: Medicare Other

## 2021-12-17 ENCOUNTER — Other Ambulatory Visit: Payer: Self-pay | Admitting: Internal Medicine

## 2021-12-17 DIAGNOSIS — Z96652 Presence of left artificial knee joint: Secondary | ICD-10-CM | POA: Diagnosis not present

## 2021-12-17 MED ORDER — HYDROCODONE-ACETAMINOPHEN 5-325 MG PO TABS: 1.0000 | ORAL_TABLET | Freq: Every day | ORAL | 0 refills | Status: DC | PRN

## 2021-12-17 NOTE — Progress Notes (Signed)
Office Visit Note   Patient: Leslie Davis           Date of Birth: Oct 09, 1955           MRN: [id number] Visit Date: 12/17/2021              Requested by: [name], [address] [place],  [place] [address] PCP: [name], MD   Assessment & Plan: Visit Diagnoses:  1. History of total knee replacement, left     Plan: Impression is 1 year status post left total knee replacement with residual pain and weakness to the quadriceps.  I believe the patient's instability and pain is likely from her underlying quadriceps weakness.  I would like to send her back to physical therapy to continue working on this as she is not making much progress on her own.  I sent in a small prescription of Norco to take in the meantime.  Follow-up in a year for repeat evaluation and x-rays.  Dental prophylaxis reinforced.  Call with concerns or questions.  Follow-Up Instructions: Return in about 1 year (around 12/18/2022).   Orders:  Orders Placed This Encounter  Procedures   XR Knee 1-2 Views Left   Meds ordered this encounter  Medications   HYDROcodone-acetaminophen (NORCO) 5-325 MG tablet    Sig: Take 1 tablet by mouth daily as needed.    Dispense:  10 tablet    Refill:  0      Procedures: No procedures performed   Clinical Data: No additional findings.   Subjective: Chief Complaint  Patient presents with   Left Knee - Routine Post Op    HPI patient is a pleasant 66 year old female who comes in today 1 year status post left total knee replacement 10/27/2020.  She is still complaining of diffuse pain to the knee in addition to feeling of instability.  Symptoms appear to be worse with walking as well as after exercise.  She has been taking Celebrex without relief.  She notes that the tramadol does not help.  She did go back to therapy after she was seen by Dr. Erlinda Hong in May but was discharged in June.  She never regained full strength of her quads.  She does however  continue to work out at [employer].     Objective: Vital Signs: There were no vitals taken for this visit.    Ortho Exam left knee exam reveals a fully healed surgical scar without complication.  Range of motion 0 to 115 degrees.  She is stable vascular stress.  She does have diffuse tenderness throughout the entire knee.  3 out of 5 strength with resisted straight leg raise.  Specialty Comments:  No specialty comments available.  Imaging: XR Knee 1-2 Views Left  Result Date: 12/17/2021 Well-seated prosthesis without complication    PMFS History: Patient Active Problem List   Diagnosis Date Noted   Coronary artery disease of native artery of native heart with stable angina pectoris ([place])    Other constipation 05/25/2021   Class 2 severe obesity with serious comorbidity and body mass index (BMI) of 35.0 to 35.9 in adult [facility]) 12/01/2020   Status post total left knee replacement 10/27/2020   Primary osteoarthritis of left knee    NAFLD (nonalcoholic fatty liver disease) 08/06/2020   Other fatigue 07/23/2020   SOBOE (shortness of breath on exertion) 07/23/2020   Diabetes mellitus ([place]) 07/23/2020   Hypertension associated with type 2 diabetes mellitus ([place]) 07/23/2020  Hyperlipidemia associated with type 2 diabetes mellitus ([place]) 07/23/2020   Absolute anemia 07/23/2020   Vitamin D deficiency 07/23/2020   At risk for heart disease 07/23/2020   AKI (acute kidney injury) ([place]) 09/07/2019   Acute kidney injury ([place]) 09/06/2019   Bradycardia 09/06/2019   Hyperglycemia 09/06/2019   HLD (hyperlipidemia)    Hypertensive nephropathy 05/16/2019   Erosive gastritis 11/16/2017   Fatty liver 11/16/2017   Abdominal pain 12/11/2012   Early satiety 12/11/2012   Hiatal hernia    Hx of cholecystectomy 02/05/2011   Flu-like symptoms 02/05/2011   CONSTIPATION 08/27/2008   FATTY LIVER DISEASE 05/07/2008   Nausea with vomiting 04/23/2008   EPIGASTRIC PAIN 04/23/2008   RENAL CALCULUS  04/22/2008   Type 2 diabetes mellitus with stage 2 chronic kidney disease, with long-term current use of insulin ([place]) 05/19/2006   Type 2 diabetes mellitus with hyperlipidemia ([place]) 05/19/2006   HYPERTENSION, BENIGN SYSTEMIC 05/19/2006   GASTROESOPHAGEAL REFLUX, NO ESOPHAGITIS 05/19/2006   AMENORRHEA 05/19/2006   Past Medical History:  Diagnosis Date   DM (diabetes mellitus) ([place])    Fatty liver disease, nonalcoholic    Gastritis    Gastroparesis    GERD (gastroesophageal reflux disease)    Heartburn    Hiatal hernia    HLD (hyperlipidemia)    HTN (hypertension)    Joint pain    Renal calculus    Rheumatoid arthritis ([place])    Vitamin D deficiency     Family History  Problem Relation Age of Onset   Diabetes Mother    Thyroid disease Mother    Kidney disease Mother    Hypertension Father    Hyperlipidemia Father    Sudden death Father    Stroke Father    Diabetes Sister    Heart disease Sister    Kidney disease Sister    COPD Sister    Heart disease Brother 74   Diabetes Brother    Stomach cancer Maternal Aunt        X 2 aunts   Diabetes Maternal Grandmother    Colon cancer Neg Hx    Esophageal cancer Neg Hx    Rectal cancer Neg Hx    Colon polyps Neg Hx     Past Surgical History:  Procedure Laterality Date   CHOLECYSTECTOMY     COLONOSCOPY  09/10/2011   normal   LEFT HEART CATH AND CORONARY ANGIOGRAPHY N/A 08/18/2021   Procedure: LEFT HEART CATH AND CORONARY ANGIOGRAPHY;  Surgeon: Nigel Mormon, MD;  Location: [facility];  Service: Cardiovascular;  Laterality: N/A;   TOTAL KNEE ARTHROPLASTY Left 10/27/2020   Procedure: LEFT TOTAL KNEE ARTHROPLASTY;  Surgeon: [name], MD;  Location: [facility];  Service: Orthopedics;  Laterality: Left;   TUBAL LIGATION     UPPER GASTROINTESTINAL ENDOSCOPY     Social History   Occupational History   Occupation: Housekeeping    Comment: Estate manager/land agent  Tobacco Use   Smoking status: Former     Packs/day: 0.25    Years: 1.00    Total pack years: 0.25    Types: Cigarettes    Quit date: 09/10/1974    Years since quitting: 47.3   Smokeless tobacco: Never   Tobacco comments:    she no longer smokes.   Vaping Use   Vaping Use: Never used  Substance and Sexual Activity   Alcohol use: Never   Drug use: Never   Sexual activity: Not Currently    Birth control/protection: Post-menopausal

## 2021-12-18 ENCOUNTER — Telehealth: Payer: Self-pay

## 2021-12-18 NOTE — Chronic Care Management (AMB) (Unsigned)
Chronic Care Management Pharmacy Assistant   Name: Leslie Davis  MRN: [id number] DOB: 1956-02-05  Reason for Encounter: Medication Review/ Medication coordination   Recent office visits:  12-02-2021 [name], MD. Creatinine= 1.42, eGFR= 40, CO2= 19, Alkaline Phosphatase= 127. A1C= 6.1. Uacr= 234.4. Abnormal UA.   Recent consult visits:  12-17-2021 [name], MD (Orthopedics). Post op appointment.  12-09-2021 [name], DO (Weight management). Follow up visit.  12-01-2021 Landry Corporal, MD (General Surgery). Initial consult for Intraductal papilloma of breast, left .  11-24-2021 Mansouraty, Telford Nab., MD Gertie [facility]ey). Colonoscopy completed.  10-14-2021 [name], DO  (Weight management). Follow up visit.  10-08-2021 Rex Kras, DO (Cardiology). START carvedilol 3.125 mg twice daily. STOP metoprolol and bidil.  Hospital visits:  Medication Reconciliation was completed by comparing discharge summary, patient's EMR and Pharmacy list, and upon discussion with patient.   Admitted to the hospital on 06-25-2021 due to shortness of breath. Discharge date was 06-25-2021. Discharged from [facility]?Medications Started at [facility] Discharge:?? None   Medication Changes at Hospital Discharge: None   Medications Discontinued at Hospital Discharge: None   Medications that remain the same after Hospital Discharge:??  -All other medications will remain the same.    Medications: Outpatient Encounter Medications as of 12/18/2021  Medication Sig   amLODipine (NORVASC) 10 MG tablet TAKE ONE TABLET BY MOUTH ONCE DAILY   aspirin EC 81 MG tablet Take 1 tablet (81 mg total) by mouth daily. Swallow whole.   Blood Glucose Monitoring Suppl (ONETOUCH VERIO) w/Device KIT Use as directed to check blood sugars 2 times per day dx: e11.65   carvedilol (COREG) 3.125 MG tablet TAKE ONE TABLET BY MOUTH TWICE DAILY WITH A MEAL   celecoxib (CELEBREX)  200 MG capsule Take 1 capsule (200 mg total) by mouth 2 (two) times daily as needed.   cetirizine (ZYRTEC) 10 MG tablet Take 1 tablet (10 mg total) by mouth daily.   EDARBI 80 MG TABS TAKE ONE TABLET BY MOUTH ONCE DAILY   FARXIGA 10 MG TABS tablet TAKE ONE TABLET BY MOUTH ONCE DAILY BEFORE BREAKFAST   ferrous gluconate (FERGON) 324 MG tablet TAKE 1 TABLET BY MOUTH TWICE DAILY WITH A MEAL. (Patient taking differently: Take 324 mg by mouth 2 (two) times daily with a meal.)   glucose blood (ONETOUCH VERIO) test strip Use as instructed to check blood sugars 2 times per day dx:e11.65   HYDROcodone-acetaminophen (NORCO) 5-325 MG tablet Take 1 tablet by mouth daily as needed.   linaclotide ([name]) 145 MCG CAPS capsule Take 1 capsule (145 mcg total) by mouth daily. (Patient taking differently: Take 145 mcg by mouth daily as needed (constipation).)   nitroGLYCERIN (NITROSTAT) 0.4 MG SL tablet Place 1 tablet (0.4 mg total) under the tongue every 5 (five) minutes as needed for chest pain. If you require more than two tablets five minutes apart go to the nearest ER via EMS.   OneTouch Delica Lancets 42L MISC Use as directed to check blood sugars 2 times per day dx:e11.65   pantoprazole (PROTONIX) 40 MG tablet Take 1 tablet (40 mg total) by mouth every morning.   polyethylene glycol (MIRALAX / GLYCOLAX) 17 g packet Take 17 g by mouth daily as needed. Take one dose daily for 5 days before the colonoscopy   rosuvastatin (CRESTOR) 20 MG tablet TAKE ONE TABLET BY MOUTH ONCE DAILY   Semaglutide, 2 MG/DOSE, 8 MG/3ML SOPN Inject 2 mg as directed  once a week.   Sennosides (EX-LAX PO) Take by mouth.   temazepam (RESTORIL) 30 MG capsule TAKE ONE CAPSULE BY MOUTH EVERYDAY AT BEDTIME AS NEEDED   TOUJEO SOLOSTAR 300 UNIT/ML Solostar Pen INJECT 50 UNITS SUBCUTANEOUSLY EVERYDAY AT BEDTIME   TRUEPLUS PEN NEEDLES 31G X 6 MM MISC USE WITH pen TO INJECT insulin DAILY   No facility-administered encounter medications on file as  of 12/18/2021.  Reviewed chart for medication changes ahead of medication coordination call.   BP Readings from Last 3 Encounters:  12/09/21 (!) 140/77  12/02/21 120/74  11/24/21 (!) 178/88    Lab Results  Component Value Date   HGBA1C 6.1 (H) 12/02/2021     Patient obtains medications through Vials  90 Days   Last adherence delivery included:  Chlorthalidone 25 mg 0.5 tablet daily Farxiga 64m 1 tablet daily Edarbi 80 mg- 1 tablet daily  Fergon 324 mg  2 times a day onetouch verio strips twice daily Amlodipine 10 mg daily Rosuvastatin 20 mg daily Pen needles  Isosorbide-hydralazine 20-37.5 mg 3 times daily Pantoprazole 40 mg daily Tramadol 50 mg every 12 hours as needed Metoprolol 25 mg daily Celebrex 200 mg twice daily Toujeo 50 units at bedtime Aspirin 81 mg daily Cetirizine 10 mg daily  Patient declined (meds) last month: Unable to reach patient.  Patient is due for next adherence delivery on: 12-30-2021  Called patient and reviewed medications and coordinated delivery.  This delivery to include: Chlorthalidone 25 mg 0.5 tablet daily Farxiga 119m1 tablet daily Edarbi 80 mg- 1 tablet daily  Fergon 324 mg  2 times a day onetouch verio strips twice daily Amlodipine 10 mg daily Rosuvastatin 20 mg daily Pen needles  Isosorbide-hydralazine 20-37.5 mg 3 times daily Pantoprazole 40 mg daily Tramadol 50 mg every 12 hours as needed Metoprolol 25 mg daily Celebrex 200 mg twice daily Toujeo 50 units at bedtime Aspirin 81 mg daily Cetirizine 10 mg daily  Patient will need a short fill of (med), prior to adherence delivery. (To align with sync date or if PRN med)  Coordinated acute fill for (med) to be delivered (date).  Patient declined the following medications (meds) due to (reason)  Patient needs refills for: Chas[place] sent refills to specialist EdEarnest RosierFarxiga  Fergon Rosuvastatin  Toujeo  Confirmed delivery date of ***, advised patient that pharmacy  will contact them the morning of delivery.   Care Gaps:  Star Rating Drugs:  Ma[facility] Pharmacist Assistant [phone]

## 2021-12-19 ENCOUNTER — Ambulatory Visit (INDEPENDENT_AMBULATORY_CARE_PROVIDER_SITE_OTHER): Payer: Medicare Other

## 2021-12-19 DIAGNOSIS — Z0000 Encounter for general adult medical examination without abnormal findings: Secondary | ICD-10-CM | POA: Diagnosis not present

## 2021-12-19 NOTE — Patient Instructions (Signed)

## 2021-12-19 NOTE — Progress Notes (Signed)
Subjective:   Leslie Davis is a 66 y.o. female who presents for Medicare Annual (Subsequent) preventive examination.  Leslie Davis , Thank you for taking time to come for your Medicare Wellness Visit. I appreciate your ongoing commitment to your health goals. Please review the following plan we discussed and let me know if I can assist you in the future.   These are the goals we discussed:  Goals      Eat Healthy     Timeframe:  Long-Range Goal Priority:  Low Start Date:                             Expected End Date:                       Follow Up Date 12/19/2021        Why is this important?   When you are ready to manage your nutrition or weight, having a plan and setting goals will help.  Taking small steps to change how you eat and exercise is a good place to start.    Notes:      Exercise 150 min/wk Moderate Activity     Manage My Medicine     Timeframe:  Long-Range Goal Priority:  High Start Date:                             Expected End Date:                       Follow Up Date 01/26/2022   In Progress:   - call if I am sick and can't take my medicine - please try to take your medications at the same time each day - use a pillbox to sort medicine - use an alarm clock or phone to remind me to take my medicine    Why is this important?   These steps will help you keep on track with your medicines.         Patient Stated     11/15/2019, wants to get off insulin     Patient Stated     12/17/2020, wants to get off insulin and get better     Weight (lb) < 200 lb (90.7 kg)     She would like to lose 15 pounds within the next six months.         This is a list of the screening recommended for you and due dates:  Health Maintenance  Topic Date Due   COVID-19 Vaccine (3 - Moderna risk series) 09/28/2019   Zoster (Shingles) Vaccine (1 of 2) 03/03/2022*   Pneumonia Vaccine (1 - PCV) 05/29/2022*   Flu Shot  06/20/2022*   Eye exam for diabetics   12/22/2021   Hemoglobin A1C  06/02/2022   Yearly kidney function blood test for diabetes  12/03/2022   Yearly kidney health urinalysis for diabetes  12/03/2022   Complete foot exam   12/03/2022   Tetanus Vaccine  03/27/2023   Mammogram  11/04/2023   Colon Cancer Screening  11/24/2024   DEXA scan (bone density measurement)  Completed   Hepatitis C Screening: USPSTF Recommendation to screen - Ages 68-79 yo.  Completed   HPV Vaccine  Aged Out  *Topic was postponed. The date shown is not the original due date.  Objective:    There were no vitals filed for this visit. There is no height or weight on file to calculate BMI.     08/18/2021    1:03 PM 07/07/2021    8:33 AM 06/25/2021    1:26 PM 05/11/2021   10:37 AM 12/17/2020    9:09 AM 11/12/2020   11:06 AM 10/22/2020    2:23 PM  Advanced Directives  Does Patient Have a Medical Advance Directive? _0  No No  Would patient like information on creating a medical advance directive? No - Patient declined No - Patient declined No - Patient declined No - Patient declined  No - Patient declined No - Patient declined    Current Medications (verified) Outpatient Encounter Medications as of 12/19/2021  Medication Sig   amLODipine (NORVASC) 10 MG tablet TAKE ONE TABLET BY MOUTH ONCE DAILY   aspirin EC 81 MG tablet Take 1 tablet (81 mg total) by mouth daily. Swallow whole.   Blood Glucose Monitoring Suppl (ONETOUCH VERIO) w/Device KIT Use as directed to check blood sugars 2 times per day dx: e11.65   carvedilol (COREG) 3.125 MG tablet TAKE ONE TABLET BY MOUTH TWICE DAILY WITH A MEAL   celecoxib (CELEBREX) 200 MG capsule Take 1 capsule (200 mg total) by mouth 2 (two) times daily as needed.   cetirizine (ZYRTEC) 10 MG tablet Take 1 tablet (10 mg total) by mouth daily.   EDARBI 80 MG TABS TAKE ONE TABLET BY MOUTH ONCE DAILY   FARXIGA 10 MG TABS tablet TAKE ONE TABLET BY MOUTH ONCE DAILY BEFORE BREAKFAST   ferrous gluconate (FERGON) 324  MG tablet TAKE 1 TABLET BY MOUTH TWICE DAILY WITH A MEAL. (Patient taking differently: Take 324 mg by mouth 2 (two) times daily with a meal.)   glucose blood (ONETOUCH VERIO) test strip Use as instructed to check blood sugars 2 times per day dx:e11.65   HYDROcodone-acetaminophen (NORCO) 5-325 MG tablet Take 1 tablet by mouth daily as needed.   linaclotide ([name]) 145 MCG CAPS capsule Take 1 capsule (145 mcg total) by mouth daily. (Patient taking differently: Take 145 mcg by mouth daily as needed (constipation).)   nitroGLYCERIN (NITROSTAT) 0.4 MG SL tablet Place 1 tablet (0.4 mg total) under the tongue every 5 (five) minutes as needed for chest pain. If you require more than two tablets five minutes apart go to the nearest ER via EMS.   OneTouch Delica Lancets 53Z MISC Use as directed to check blood sugars 2 times per day dx:e11.65   pantoprazole (PROTONIX) 40 MG tablet Take 1 tablet (40 mg total) by mouth every morning.   polyethylene glycol (MIRALAX / GLYCOLAX) 17 g packet Take 17 g by mouth daily as needed. Take one dose daily for 5 days before the colonoscopy   rosuvastatin (CRESTOR) 20 MG tablet TAKE ONE TABLET BY MOUTH ONCE DAILY   Semaglutide, 2 MG/DOSE, 8 MG/3ML SOPN Inject 2 mg as directed once a week.   Sennosides (EX-LAX PO) Take by mouth.   temazepam (RESTORIL) 30 MG capsule TAKE ONE CAPSULE BY MOUTH EVERYDAY AT BEDTIME AS NEEDED   TOUJEO SOLOSTAR 300 UNIT/ML Solostar Pen INJECT 50 UNITS SUBCUTANEOUSLY EVERYDAY AT BEDTIME   TRUEPLUS PEN NEEDLES 31G X 6 MM MISC USE WITH pen TO INJECT insulin DAILY   No facility-administered encounter medications on file as of 12/19/2021.    Allergies (verified) Fiasp [insulin aspart (w-niacinamide)]   History: Past Medical History:  Diagnosis Date   DM (diabetes mellitus) (  [place])    Fatty liver disease, nonalcoholic    Gastritis    Gastroparesis    GERD (gastroesophageal reflux disease)    Heartburn    Hiatal hernia    HLD (hyperlipidemia)     HTN (hypertension)    Joint pain    Renal calculus    Rheumatoid arthritis ([place])    Vitamin D deficiency    Past Surgical History:  Procedure Laterality Date   CHOLECYSTECTOMY     COLONOSCOPY  09/10/2011   normal   LEFT HEART CATH AND CORONARY ANGIOGRAPHY N/A 08/18/2021   Procedure: LEFT HEART CATH AND CORONARY ANGIOGRAPHY;  Surgeon: Nigel Mormon, MD;  Location: [facility];  Service: Cardiovascular;  Laterality: N/A;   TOTAL KNEE ARTHROPLASTY Left 10/27/2020   Procedure: LEFT TOTAL KNEE ARTHROPLASTY;  Surgeon: [name], MD;  Location: [facility] [place];  Service: Orthopedics;  Laterality: Left;   TUBAL LIGATION     UPPER GASTROINTESTINAL ENDOSCOPY     Family History  Problem Relation Age of Onset   Diabetes Mother    Thyroid disease Mother    Kidney disease Mother    Hypertension Father    Hyperlipidemia Father    Sudden death Father    Stroke Father    Diabetes Sister    Heart disease Sister    Kidney disease Sister    COPD Sister    Heart disease Brother 84   Diabetes Brother    Stomach cancer Maternal Aunt        X 2 aunts   Diabetes Maternal Grandmother    Colon cancer Neg Hx    Esophageal cancer Neg Hx    Rectal cancer Neg Hx    Colon polyps Neg Hx    Social History   Socioeconomic History   Marital status: Married    Spouse name: Eldean Klatt   Number of children: 2   Years of education: Not on file   Highest education level: Not on file  Occupational History   Occupation: Housekeeping    Comment: Estate agent Service  Tobacco Use   Smoking status: Former    Packs/day: 0.25    Years: 1.00    Total pack years: 0.25    Types: Cigarettes    Quit date: 09/10/1974    Years since quitting: 47.3   Smokeless tobacco: Never   Tobacco comments:    she no longer smokes.   Vaping Use   Vaping Use: Never used  Substance and Sexual Activity   Alcohol use: Never   Drug use: Never   Sexual activity: Not Currently    Birth control/protection:  Post-menopausal  Other Topics Concern   Not on file  Social History Narrative   Lives with husband.  Part time job.    Social Determinants of Health   Financial Resource Strain: Low Risk  (12/17/2020)   Overall Financial Resource Strain (CARDIA)    Difficulty of Paying Living Expenses: Not hard at all  Food Insecurity: No Food Insecurity (12/17/2020)   Hunger Vital Sign    Worried About Running Out of Food in the Last Year: Never true    Ran Out of Food in the Last Year: Never true  Transportation Needs: No Transportation Needs (12/17/2020)   PRAPARE - Hydrologist (Medical): No    Lack of Transportation (Non-Medical): No  Physical Activity: Inactive (12/17/2020)   Exercise Vital Sign    Days of Exercise per Week: 0 days  Minutes of Exercise per Session: 0 min  Stress: No Stress Concern Present (12/17/2020)   [place]    Feeling of Stress : Not at all  Social Connections: Not on file    Tobacco Counseling Counseling given: Not Answered Tobacco comments: she no longer smokes.    Clinical Intake:                 Diabetic?yes  Nutrition Risk Assessment:  Has the patient had any N/V/D within the last 2 months?  No  Does the patient have any non-healing wounds?  No  Has the patient had any unintentional weight loss or weight gain?  No   Diabetes:  Is the patient diabetic?  Yes  If diabetic, was a CBG obtained today?  Yes  Did the patient bring in their glucometer from home?   na How often do you monitor your CBG's? Every morning.   Financial Strains and Diabetes Management:  Are you having any financial strains with the device, your supplies or your medication? No .  Does the patient want to be seen by Chronic Care Management for management of their diabetes?  No  Would the patient like to be referred to a Nutritionist or for Diabetic Management?  No   Diabetic  Exams:  Diabetic Eye Exam: Completed 12/22/2020 Diabetic Foot Exam: Completed 12/02/2021          Activities of Daily Living     No data to display           Patient Care Team: [name], MD as PCP - General (Internal Medicine) Minus Breeding, MD as PCP - Cardiology (Cardiology) Mayford Knife, [facility] (Pharmacist)  Indicate any recent Medical Services you may have received from other than [facility] providers in the past year (date may be approximate).     Assessment:   This is a routine wellness examination for Gabriele.  Hearing/Vision screen No results found.  Dietary issues and exercise activities discussed:     Goals Addressed   None   Depression Screen    09/16/2021    2:57 PM 12/17/2020    9:10 AM 07/23/2020    9:43 AM 11/15/2019    9:34 AM 02/08/2019    3:02 PM 11/08/2018    9:55 AM 06/13/2018   11:19 AM  PHQ 2/9 Scores  PHQ - 2 Score 0 0 4 0 0 0 0  PHQ- 9 Score   11 0       Fall Risk    12/17/2020    9:09 AM 11/20/2020   10:29 AM 11/15/2019    9:33 AM 02/08/2019    3:01 PM 11/08/2018    9:55 AM  Fall Risk   Falls in the past year? 0 0 0 0 0  Number falls in past yr:  0   0  Injury with Fall?  0  0   Risk for fall due to : Medication side effect  Medication side effect    Follow up Falls evaluation completed;Education provided;Falls prevention discussed  Falls evaluation completed;Education provided;Falls prevention discussed      FALL RISK PREVENTION PERTAINING TO THE HOME:  Any stairs in or around the home? Yes  If so, are there any without handrails? No  Home free of loose throw rugs in walkways, pet beds, electrical cords, etc? Yes  Adequate lighting in your home to reduce risk of falls? Yes   ASSISTIVE DEVICES UTILIZED TO PREVENT FALLS:  Life  alert? No  Use of a cane, walker or w/c? No  Grab bars in the bathroom? No  Shower chair or bench in shower? No  Elevated toilet seat or a handicapped toilet? No   Cognitive Function:         12/17/2020    9:11 AM 11/15/2019    9:36 AM 02/08/2019    2:30 PM 11/08/2018    9:58 AM  6CIT Screen  What Year? 0 points 0 points 0 points 0 points  What month? 0 points 0 points 0 points 0 points  What time? 0 points 0 points 0 points 0 points  Count back from 20 0 points 0 points 0 points 0 points  Months in reverse 0 points 0 points 0 points 0 points  Repeat phrase 2 points 2 points 0 points 2 points  Total Score 2 points 2 points 0 points 2 points    Immunizations Immunization History  Administered Date(s) Administered   DTaP 03/26/2013   Fluad Quad(high Dose 65+) 11/08/2018, 02/21/2020, 02/26/2021   Influenza, High Dose Seasonal PF 11/08/2018   Influenza-Unspecified 11/30/2017   Moderna Sars-Covid-2 Vaccination 08/03/2019, 08/31/2019   Td 08/20/2005    TDAP status: Up to date  Flu Vaccine status: Due, Education has been provided regarding the importance of this vaccine. Advised may receive this vaccine at local pharmacy or Health Dept. Aware to provide a copy of the vaccination record if obtained from local pharmacy or Health Dept. Verbalized acceptance and understanding.  Pneumococcal vaccine status: Due, Education has been provided regarding the importance of this vaccine. Advised may receive this vaccine at local pharmacy or Health Dept. Aware to provide a copy of the vaccination record if obtained from local pharmacy or Health Dept. Verbalized acceptance and understanding.  Covid-19 vaccine status: Information provided on how to obtain vaccines.   Qualifies for Shingles Vaccine? Yes   Zostavax completed No   Shingrix Completed?: No.    Education has been provided regarding the importance of this vaccine. Patient has been advised to call insurance company to determine out of pocket expense if they have not yet received this vaccine. Advised may also receive vaccine at local pharmacy or Health Dept. Verbalized acceptance and understanding.  Screening Tests Health  Maintenance  Topic Date Due   COVID-19 Vaccine (3 - Moderna risk series) 09/28/2019   Zoster Vaccines- Shingrix (1 of 2) 03/03/2022 (Originally 10/25/1974)   Pneumonia Vaccine 77+ Years old (1 - PCV) 05/29/2022 (Originally 10/26/2018)   INFLUENZA VACCINE  06/20/2022 (Originally 10/20/2021)   OPHTHALMOLOGY EXAM  12/22/2021   HEMOGLOBIN A1C  06/02/2022   Diabetic kidney evaluation - GFR measurement  12/03/2022   Diabetic kidney evaluation - Urine ACR  12/03/2022   FOOT EXAM  12/03/2022   TETANUS/TDAP  03/27/2023   MAMMOGRAM  11/04/2023   COLONOSCOPY (Pts 45-73yr Insurance coverage will need to be confirmed)  11/24/2024   DEXA SCAN  Completed   Hepatitis C Screening  Completed   HPV VACCINES  Aged Out    Health Maintenance  Health Maintenance Due  Topic Date Due   COVID-19 Vaccine (3 - Moderna risk series) 09/28/2019    Colorectal cancer screening: Type of screening: Colonoscopy. Completed 11/24/2021. Repeat every 10 years  Mammogram status: Completed 11/03/2021. Repeat every year  Bone Density status: Ordered 12/19/2021. Pt provided with contact info and advised to call to schedule appt.  Lung Cancer Screening: (Low Dose CT Chest recommended if Age 68-80years, 30 pack-year currently smoking OR have quit w/in  15years.) does not qualify.   Lung Cancer Screening Referral: na  Additional Screening:  Hepatitis C Screening: does qualify; Completed yes  Vision Screening: Recommended annual ophthalmology exams for early detection of glaucoma and other disorders of the eye. Is the patient up to date with their annual eye exam?  Yes  Who is the provider or what is the name of the office in which the patient attends annual eye exams? Dr Carlota Raspberry- [facility]  If pt is not established with a provider, would they like to be referred to a provider to establish care?  established .   Dental Screening: Recommended annual dental exams for proper oral hygiene  Community Resource Referral /  Chronic Care Management: CRR required this visit?  No   CCM required this visit?  No      Plan:     I have personally reviewed and noted the following in the patient's chart:   Medical and social history Use of alcohol, tobacco or illicit drugs  Current medications and supplements including opioid prescriptions. Patient is currently taking opioid prescriptions. Information provided to patient regarding non-opioid alternatives. Patient advised to discuss non-opioid treatment plan with their provider. Functional ability and status Nutritional status Physical activity Advanced directives List of other physicians Hospitalizations, surgeries, and ER visits in previous 12 months Vitals Screenings to include cognitive, depression, and falls Referrals and appointments  In addition, I have reviewed and discussed with patient certain preventive protocols, quality metrics, and best practice recommendations. A written personalized care plan for preventive services as well as general preventive health recommendations were provided to patient.     Octavio Manns   12/19/2021   Nurse Notes:   Ms. Shankman , Thank you for taking time to come for your Medicare Wellness Visit. I appreciate your ongoing commitment to your health goals. Please review the following plan we discussed and let me know if I can assist you in the future.   These are the goals we discussed:  Goals      Eat Healthy     Timeframe:  Long-Range Goal Priority:  Low Start Date:                             Expected End Date:                       Follow Up Date 12/19/2021        Why is this important?   When you are ready to manage your nutrition or weight, having a plan and setting goals will help.  Taking small steps to change how you eat and exercise is a good place to start.    Notes:      Exercise 150 min/wk Moderate Activity     Manage My Medicine     Timeframe:  Long-Range Goal Priority:  High Start  Date:                             Expected End Date:                       Follow Up Date 01/26/2022   In Progress:   - call if I am sick and can't take my medicine - please try to take your medications at the same time each day - use a pillbox to sort medicine - use an  alarm clock or phone to remind me to take my medicine    Why is this important?   These steps will help you keep on track with your medicines.         Patient Stated     11/15/2019, wants to get off insulin     Patient Stated     12/17/2020, wants to get off insulin and get better     Weight (lb) < 200 lb (90.7 kg)     She would like to lose 15 pounds within the next six months.         This is a list of the screening recommended for you and due dates:  Health Maintenance  Topic Date Due   COVID-19 Vaccine (3 - Moderna risk series) 09/28/2019   Zoster (Shingles) Vaccine (1 of 2) 03/03/2022*   Pneumonia Vaccine (1 - PCV) 05/29/2022*   Flu Shot  06/20/2022*   Eye exam for diabetics  12/22/2021   Hemoglobin A1C  06/02/2022   Yearly kidney function blood test for diabetes  12/03/2022   Yearly kidney health urinalysis for diabetes  12/03/2022   Complete foot exam   12/03/2022   Tetanus Vaccine  03/27/2023   Mammogram  11/04/2023   Colon Cancer Screening  11/24/2024   DEXA scan (bone density measurement)  Completed   Hepatitis C Screening: USPSTF Recommendation to screen - Ages 20-79 yo.  Completed   HPV Vaccine  Aged Out  *Topic was postponed. The date shown is not the original due date.

## 2021-12-22 ENCOUNTER — Telehealth: Payer: Self-pay

## 2021-12-22 DIAGNOSIS — Z794 Long term (current) use of insulin: Secondary | ICD-10-CM

## 2021-12-22 MED ORDER — SEMAGLUTIDE (2 MG/DOSE) 8 MG/3ML ~~LOC~~ SOPN: 2.0000 mg | PEN_INJECTOR | SUBCUTANEOUS | 0 refills | Status: DC

## 2021-12-22 NOTE — Progress Notes (Signed)
12-22-2021: Was informed [facility] and [facility]. Ozempic is on back order at [facility] and [place]. Prescription was transferred to [facility] on [address]. Patient informed to pick medication up from [place].   [place] Pharmacist Assistant [phone]

## 2021-12-22 NOTE — Telephone Encounter (Signed)
  Care Management   Follow Up Note - Medication Adherence   12/22/2021 Name: Leslie Davis MRN: [id number] DOB: 24-Oct-1955   Referred by: [name], MD Reason for referral : No chief complaint on file.   Called patient and she reports that she does not need a refill of the humalog. She reports that she has 2 pens left of her humalog. She reports that she does not need to her Humalog often, last night she had to use it because her BS went up to 210 and she had to use 9 units because she ate [employer] Cream. Will recommend to PCP team that Humalog is added to patient medication list but not refilled at this time. Patient reports that she does need a refill of her Ozempic. Prescription sent.   Follow Up Plan: The patient has been provided with contact information for the care management team and has been advised to call with any health related questions or concerns.    [name], CPP, PharmD Clinical Pharmacist Practitioner [facility] [phone]

## 2021-12-23 ENCOUNTER — Other Ambulatory Visit: Payer: Self-pay

## 2021-12-23 ENCOUNTER — Telehealth: Payer: Self-pay | Admitting: Orthopaedic Surgery

## 2021-12-23 DIAGNOSIS — Z96652 Presence of left artificial knee joint: Secondary | ICD-10-CM

## 2021-12-23 NOTE — Telephone Encounter (Signed)
Patient called. Would like a referral for PT to come here. Her call back number is [phone]

## 2021-12-24 ENCOUNTER — Encounter: Payer: Self-pay | Admitting: Cardiology

## 2021-12-28 ENCOUNTER — Other Ambulatory Visit: Payer: Self-pay | Admitting: Internal Medicine

## 2021-12-28 ENCOUNTER — Other Ambulatory Visit: Payer: Self-pay | Admitting: Physician Assistant

## 2021-12-28 ENCOUNTER — Telehealth: Payer: Self-pay | Admitting: Orthopaedic Surgery

## 2021-12-28 ENCOUNTER — Other Ambulatory Visit: Payer: Self-pay | Admitting: Cardiology

## 2021-12-28 DIAGNOSIS — I2510 Atherosclerotic heart disease of native coronary artery without angina pectoris: Secondary | ICD-10-CM

## 2021-12-28 DIAGNOSIS — I10 Essential (primary) hypertension: Secondary | ICD-10-CM

## 2021-12-28 MED ORDER — TRAMADOL HCL 50 MG PO TABS: 50.0000 mg | ORAL_TABLET | Freq: Two times a day (BID) | ORAL | 0 refills | Status: DC | PRN

## 2021-12-28 NOTE — Telephone Encounter (Signed)
Sent!

## 2021-12-28 NOTE — Telephone Encounter (Signed)
Addison Naegeli Tech called from [employer] for a request a refill of tramadol for pt. [facility] phone number is [phone].

## 2021-12-30 ENCOUNTER — Other Ambulatory Visit: Payer: Self-pay | Admitting: General Surgery

## 2021-12-30 DIAGNOSIS — D242 Benign neoplasm of left breast: Secondary | ICD-10-CM

## 2021-12-30 NOTE — Therapy (Signed)
OUTPATIENT PHYSICAL THERAPY EVALUATION   Patient Name: Leslie Davis MRN: [id number] DOB:01/31/1956, 66 y.o., female Today's Date: 12/31/2021  END OF SESSION:   PT End of Session - 12/31/21 0928     Visit Number 1    Number of Visits 20    Date for PT Re-Evaluation 03/11/22    Authorization Type UHC Medicare 20% co insurance    Progress Note Due on Visit 10    PT Start Time [phone]    PT Stop Time 1000    PT Time Calculation (min) 29 min    Activity Tolerance Patient tolerated treatment well    Behavior During Therapy WFL for tasks assessed/performed              Past Medical History:  Diagnosis Date   DM (diabetes mellitus) ([place])    Fatty liver disease, nonalcoholic    Gastritis    Gastroparesis    GERD (gastroesophageal reflux disease)    Heartburn    Hiatal hernia    HLD (hyperlipidemia)    HTN (hypertension)    Joint pain    Renal calculus    Rheumatoid arthritis ([place])    Vitamin D deficiency    Past Surgical History:  Procedure Laterality Date   CHOLECYSTECTOMY     COLONOSCOPY  09/10/2011   normal   LEFT HEART CATH AND CORONARY ANGIOGRAPHY N/A 08/18/2021   Procedure: LEFT HEART CATH AND CORONARY ANGIOGRAPHY;  Surgeon: Nigel Mormon, MD;  Location: [facility];  Service: Cardiovascular;  Laterality: N/A;   TOTAL KNEE ARTHROPLASTY Left 10/27/2020   Procedure: LEFT TOTAL KNEE ARTHROPLASTY;  Surgeon: [name], MD;  Location: [facility];  Service: Orthopedics;  Laterality: Left;   TUBAL LIGATION     UPPER GASTROINTESTINAL ENDOSCOPY     Patient Active Problem List   Diagnosis Date Noted   Coronary artery disease of native artery of native heart with stable angina pectoris ([place])    Other constipation 05/25/2021   Class 2 severe obesity with serious comorbidity and body mass index (BMI) of 35.0 to 35.9 in adult ([place]) 12/01/2020   Status post total left knee replacement 10/27/2020   Primary osteoarthritis of left knee    NAFLD (nonalcoholic  fatty liver disease) 08/06/2020   Other fatigue 07/23/2020   SOBOE (shortness of breath on exertion) 07/23/2020   Diabetes mellitus ([place]) 07/23/2020   Hypertension associated with type 2 diabetes mellitus ([place]) 07/23/2020   Hyperlipidemia associated with type 2 diabetes mellitus ([place]) 07/23/2020   Absolute anemia 07/23/2020   Vitamin D deficiency 07/23/2020   At risk for heart disease 07/23/2020   AKI (acute kidney injury) ([place]) 09/07/2019   Acute kidney injury ([place]) 09/06/2019   Bradycardia 09/06/2019   Hyperglycemia 09/06/2019   HLD (hyperlipidemia)    Hypertensive nephropathy 05/16/2019   Erosive gastritis 11/16/2017   Fatty liver 11/16/2017   Abdominal pain 12/11/2012   Early satiety 12/11/2012   Hiatal hernia    Hx of cholecystectomy 02/05/2011   Flu-like symptoms 02/05/2011   CONSTIPATION 08/27/2008   FATTY LIVER DISEASE 05/07/2008   Nausea with vomiting 04/23/2008   EPIGASTRIC PAIN 04/23/2008   RENAL CALCULUS 04/22/2008   Type 2 diabetes mellitus with stage 2 chronic kidney disease, with long-term current use of insulin ([place]) 05/19/2006   Type 2 diabetes mellitus with hyperlipidemia ([place]) 05/19/2006   HYPERTENSION, BENIGN SYSTEMIC 05/19/2006   GASTROESOPHAGEAL REFLUX, NO ESOPHAGITIS 05/19/2006   AMENORRHEA 05/19/2006    PCP: [name], MD  REFERRING  PROVIDER: [name], MD  REFERRING DIAG: [phone] (ICD-10-CM) - Status post total left knee replacement  THERAPY DIAG:  Chronic pain of left knee  Muscle weakness (generalized)  Difficulty in walking, not elsewhere classified  Localized edema  ONSET DATE: 10/27/2020 - TKA, acute on chronic a few weeks  SUBJECTIVE:   SUBJECTIVE STATEMENT: Pt had TKA in 10/27/2020 with therapy post surgery.  Pt indicated she has been exercises but last time she did, she had pain increase with achy tooth ache.  Reported tightness in back of leg.  Pt indicated she was doing some leg lifting.   Pt indicated after exercise, her  knee would get sore on most days prior to the pain increase.   Pt indicated getting some medicine from MD office.  Pt stated some difficulty c sleeping at night due to symptoms.   PERTINENT HISTORY: DM, HTN  PAIN:  NPRS scale: current 7/10, at worst 9/10, at best in last week 5/10.  Pain location: Lt knee Pain description: chronic  Aggravating factors: post workout pain, walking/standing Relieving factors: medicine with very painful  PRECAUTIONS: None  WEIGHT BEARING RESTRICTIONS No  FALLS:  Has patient fallen in last 6 months? 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      Additional Comments 3 steps to enter-has handrail on Rt side     OCCUPATION: Retired  PLOF:  Level of [place] Retired     Leisure works out at gym     PATIENT GOALS Reduce pain, walking    OBJECTIVE:   PATIENT SURVEYS:   12/31/2021: FOTO intake: 51  predicted:  71  COGNITION: 12/31/2021 Overall cognitive status: Within functional limits for tasks assessed     PALPATION:  12/31/2021 : Mild tenderness to touch anterior knee joint, inferior patella Lt  LE ROM:  Active ROM Right 12/31/2021 Left 12/31/2021  Hip flexion    Hip extension    Hip abduction    Hip adduction    Hip internal rotation    Hip external rotation    Knee flexion  110 AROM supine heel slide c pain anterior knee  Knee extension  -3 in seated LAQ AROM  0 PROM in supine heel prop  Ankle dorsiflexion    Ankle plantarflexion    Ankle inversion    Ankle eversion     (Blank rows = not tested)  LE MMT:  MMT Right 12/31/2021 Left 12/31/2021  Hip flexion 5/5 5/5  Hip extension    Hip abduction    Hip adduction    Hip internal rotation    Hip external rotation    Knee flexion 5/5 4/5  Knee extension 5/5 32.9, 31.9 lbs 4/5 23.5, 19.5 lbs c pain  Ankle dorsiflexion 5/5 5/5  Ankle plantarflexion    Ankle inversion    Ankle eversion     (Blank rows = not  tested)   FUNCTIONAL TESTS:  12/31/2021 TUG: independent 10.64 seconds  Lt SLS:  3 seconds   Rt SLS: 5 seconds  18 inch transfer s UE assist: on first try with increased difficulty/pain in mid range Lt knee  GAIT: 12/31/2021 Distance walked: Household distances within clinic  Assistive device utilized: Independent  Comments: Mild maintained knee flexion in stance Lt leg, mild reduction of stance on Lt leg.     TODAY'S TREATMENT: 12/31/2021 Therex: HEP instruction/performance c cues for techniques, handout provided.  Trial set performed of each for comprehension and symptom assessment.  See  below for exercise list.  Manual: Lt knee flexion c distraction/IR mobilization with movement.     PATIENT EDUCATION:  12/31/2021 Education details: HEP, POC Person educated: Patient Education method: Consulting civil engineer, Demonstration, Verbal cues, and Handouts Education comprehension: verbalized understanding, returned demonstration, and verbal cues required   HOME EXERCISE PROGRAM: Access Code: [email or web address] URL: [email or web address] Date: 12/31/2021 Prepared by: Scot Jun  Exercises - Supine Quadricep Sets  - 3-5 x daily - 7 x weekly - 1 sets - 10 reps - 5 hold - Supine Heel Slide (Mirrored)  - 3-5 x daily - 7 x weekly - 1 sets - 10 reps - 2 hold - Seated Quad Set (Mirrored)  - 3-5 x daily - 7 x weekly - 1 sets - 10 reps - 5 hold - Seated Long Arc Quad (Mirrored)  - 3-5 x daily - 7 x weekly - 1 sets - 5-10 reps - 2 hold - Seated Straight Leg Heel Taps  - 1-2 x daily - 7 x weekly - 1-2 sets - 5-10 reps - Sit to Stand  - 3 x daily - 7 x weekly - 1 sets - 10 reps - Seated Hamstring Stretch  - 1-2 x daily - 7 x weekly - 1 sets - 3-5 reps - 15 hold  ASSESSMENT:  CLINICAL IMPRESSION: Patient is a 66 y.o. who comes to clinic with complaints of Lt knee pain (history of Lt TKA 10/27/2020) with mobility, strength and movement coordination deficits that impair their ability to  perform usual daily and recreational functional activities without increase difficulty/symptoms at this time.  Patient to benefit from skilled PT services to address impairments and limitations to improve to previous level of function without restriction secondary to condition.    OBJECTIVE IMPAIRMENTS Abnormal gait, decreased activity tolerance, decreased balance, decreased coordination, decreased endurance, decreased mobility, difficulty walking, decreased ROM, decreased strength, hypomobility, increased edema, impaired perceived functional ability, impaired flexibility, improper body mechanics, and pain.   ACTIVITY LIMITATIONS cleaning, community activity, meal prep, laundry, and shopping.   PERSONAL FACTORS  HTN, hyperlipidemia, GERD, DM, RA, length of time since onset  are also affecting patient's functional outcome.    REHAB POTENTIAL: Fair to good  CLINICAL DECISION MAKING: Stable/uncomplicated  EVALUATION COMPLEXITY: Low   GOALS: Goals reviewed with patient? Yes Eval: 07/07/2021 Short term PT Goals (target date for Short term goals are 3 weeks 01/21/2022) Patient will demonstrate independent use of home exercise program to maintain progress from in clinic treatments. Goal status: New   Long term PT goals (target dates for all long term goals are 10 weeks  03/11/2022 )  1. Patient will demonstrate/report pain at worst less than or equal to 2/10 to facilitate minimal limitation in daily activity secondary to pain symptoms. Goal status: New  2. Patient will demonstrate independent use of home exercise program to facilitate ability to maintain/progress functional gains from skilled physical therapy services. Goal status: New  3. Patient will demonstrate FOTO outcome > or = 71 % to indicate reduced disability due to condition. Goal status: New  4.  Patient will demonstrate Lt knee AROM 0-115 degrees s symptoms to facilitate ability to perform transfers, sitting, ambulation, stair  navigation s restriction due to mobility. Goal status: New  5.  Patient will demonstrate bilateral LE MMT 5/5, dynamometry improvement of > = 10 lbs to facilitate ability to perform usual standing, walking, stairs at PLOF s limitation due to symptoms.   Goal status: New  6.  Patient will demonstrate/report ability to  ascend/descend stairs c reciprocal gait pattern for household entry.   Goal status: New  7.  Patient will demonstrate/report ability to return to gym workout routine.  a.  Goal Status: New   PLAN: PT FREQUENCY: 1-2x/week  PT DURATION: 10 weeks  PLANNED INTERVENTIONS:  Therapeutic exercises, Therapeutic activity, Neuro Muscular re-education, Balance training, Gait training, Patient/Family education, Joint mobilization, Stair training, DME instructions, Dry Needling, Electrical stimulation, Cryotherapy, Moist heat, Taping, Ultrasound, Ionotophoresis 46m/ml Dexamethasone, and Manual therapy.  All included unless contraindicated  PLAN FOR NEXT SESSION: Quad strengthening progressively, static balance improvements.   Manual for knee flexion mobility gains.    MScot Jun PT, DPT, OCS, ATC 12/31/21  10:02 AM

## 2021-12-31 ENCOUNTER — Other Ambulatory Visit: Payer: Self-pay

## 2021-12-31 ENCOUNTER — Encounter: Payer: Self-pay | Admitting: Rehabilitative and Restorative Service Providers"

## 2021-12-31 ENCOUNTER — Ambulatory Visit (INDEPENDENT_AMBULATORY_CARE_PROVIDER_SITE_OTHER): Payer: Medicare Other | Admitting: Rehabilitative and Restorative Service Providers"

## 2021-12-31 DIAGNOSIS — M25562 Pain in left knee: Secondary | ICD-10-CM | POA: Diagnosis not present

## 2021-12-31 DIAGNOSIS — R262 Difficulty in walking, not elsewhere classified: Secondary | ICD-10-CM

## 2021-12-31 DIAGNOSIS — M6281 Muscle weakness (generalized): Secondary | ICD-10-CM

## 2021-12-31 DIAGNOSIS — R600 Localized edema: Secondary | ICD-10-CM | POA: Diagnosis not present

## 2021-12-31 DIAGNOSIS — G8929 Other chronic pain: Secondary | ICD-10-CM

## 2022-01-04 ENCOUNTER — Ambulatory Visit (INDEPENDENT_AMBULATORY_CARE_PROVIDER_SITE_OTHER): Payer: Medicare Other | Admitting: Rehabilitative and Restorative Service Providers"

## 2022-01-04 ENCOUNTER — Encounter: Payer: Medicare Other | Admitting: Physical Therapy

## 2022-01-04 ENCOUNTER — Telehealth: Payer: Self-pay | Admitting: Physical Therapy

## 2022-01-04 ENCOUNTER — Encounter: Payer: Self-pay | Admitting: Rehabilitative and Restorative Service Providers"

## 2022-01-04 DIAGNOSIS — R600 Localized edema: Secondary | ICD-10-CM | POA: Diagnosis not present

## 2022-01-04 DIAGNOSIS — M6281 Muscle weakness (generalized): Secondary | ICD-10-CM

## 2022-01-04 DIAGNOSIS — M25562 Pain in left knee: Secondary | ICD-10-CM

## 2022-01-04 DIAGNOSIS — G8929 Other chronic pain: Secondary | ICD-10-CM

## 2022-01-04 DIAGNOSIS — R262 Difficulty in walking, not elsewhere classified: Secondary | ICD-10-CM

## 2022-01-04 NOTE — Therapy (Signed)
OUTPATIENT PHYSICAL THERAPY EVALUATION   Patient Name: Leslie Davis MRN: [id number] DOB:23-Apr-1955, 66 y.o., female Today's Date: 01/04/2022  END OF SESSION:   PT End of Session - 01/04/22 1435     Visit Number 2    Number of Visits 20    Date for PT Re-Evaluation 03/11/22    Authorization Type UHC Medicare 20% co insurance    Progress Note Due on Visit 10    PT Start Time 1427    PT Stop Time 1506    PT Time Calculation (min) 39 min    Activity Tolerance Patient tolerated treatment well    Behavior During Therapy WFL for tasks assessed/performed               Past Medical History:  Diagnosis Date   DM (diabetes mellitus) ([place])    Fatty liver disease, nonalcoholic    Gastritis    Gastroparesis    GERD (gastroesophageal reflux disease)    Heartburn    Hiatal hernia    HLD (hyperlipidemia)    HTN (hypertension)    Joint pain    Renal calculus    Rheumatoid arthritis ([place])    Vitamin D deficiency    Past Surgical History:  Procedure Laterality Date   CHOLECYSTECTOMY     COLONOSCOPY  09/10/2011   normal   LEFT HEART CATH AND CORONARY ANGIOGRAPHY N/A 08/18/2021   Procedure: LEFT HEART CATH AND CORONARY ANGIOGRAPHY;  Surgeon: Nigel Mormon, MD;  Location: [facility];  Service: Cardiovascular;  Laterality: N/A;   TOTAL KNEE ARTHROPLASTY Left 10/27/2020   Procedure: LEFT TOTAL KNEE ARTHROPLASTY;  Surgeon: [name], MD;  Location: [facility];  Service: Orthopedics;  Laterality: Left;   TUBAL LIGATION     UPPER GASTROINTESTINAL ENDOSCOPY     Patient Active Problem List   Diagnosis Date Noted   Coronary artery disease of native artery of native heart with stable angina pectoris ([place])    Other constipation 05/25/2021   Class 2 severe obesity with serious comorbidity and body mass index (BMI) of 35.0 to 35.9 in adult ([place]) 12/01/2020   Status post total left knee replacement 10/27/2020   Primary osteoarthritis of left knee    NAFLD  (nonalcoholic fatty liver disease) 08/06/2020   Other fatigue 07/23/2020   SOBOE (shortness of breath on exertion) 07/23/2020   Diabetes mellitus ([place]) 07/23/2020   Hypertension associated with type 2 diabetes mellitus ([place]) 07/23/2020   Hyperlipidemia associated with type 2 diabetes mellitus ([place]) 07/23/2020   Absolute anemia 07/23/2020   Vitamin D deficiency 07/23/2020   At risk for heart disease 07/23/2020   AKI (acute kidney injury) ([place]) 09/07/2019   Acute kidney injury ([place]) 09/06/2019   Bradycardia 09/06/2019   Hyperglycemia 09/06/2019   HLD (hyperlipidemia)    Hypertensive nephropathy 05/16/2019   Erosive gastritis 11/16/2017   Fatty liver 11/16/2017   Abdominal pain 12/11/2012   Early satiety 12/11/2012   Hiatal hernia    Hx of cholecystectomy 02/05/2011   Flu-like symptoms 02/05/2011   CONSTIPATION 08/27/2008   FATTY LIVER DISEASE 05/07/2008   Nausea with vomiting 04/23/2008   EPIGASTRIC PAIN 04/23/2008   RENAL CALCULUS 04/22/2008   Type 2 diabetes mellitus with stage 2 chronic kidney disease, with long-term current use of insulin ([place]) 05/19/2006   Type 2 diabetes mellitus with hyperlipidemia ([place]) 05/19/2006   HYPERTENSION, BENIGN SYSTEMIC 05/19/2006   GASTROESOPHAGEAL REFLUX, NO ESOPHAGITIS 05/19/2006   AMENORRHEA 05/19/2006    PCP: [name], MD  REFERRING PROVIDER: [name], MD  REFERRING DIAG: [phone] (ICD-10-CM) - Status post total left knee replacement  THERAPY DIAG:  Chronic pain of left knee  Muscle weakness (generalized)  Difficulty in walking, not elsewhere classified  Localized edema  ONSET DATE: 10/27/2020 - TKA, acute on chronic a few weeks  SUBJECTIVE:   SUBJECTIVE STATEMENT: Pt reported feeling improvement in symptoms.  Reported a little pain, "not much" today, reported at 3/10.    PERTINENT HISTORY: DM, HTN  PAIN:  NPRS scale: 3/10 Pain location: Lt knee Pain description: chronic  Aggravating factors:  walking Relieving factors: medicine with very painful  PRECAUTIONS: None  WEIGHT BEARING RESTRICTIONS No  FALLS:  Has patient fallen in last 6 months? 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      Additional Comments 3 steps to enter-has handrail on Rt side     OCCUPATION: Retired  PLOF:  Level of [employer] [place] Retired     Leisure works out at gym     PATIENT GOALS Reduce pain, walking    OBJECTIVE:   PATIENT SURVEYS:   12/31/2021: FOTO intake: 51  predicted:  71  COGNITION: 12/31/2021 Overall cognitive status: Within functional limits for tasks assessed     PALPATION:  12/31/2021 : Mild tenderness to touch anterior knee joint, inferior patella Lt  LE ROM:  Active ROM Right 12/31/2021 Left 12/31/2021  Hip flexion    Hip extension    Hip abduction    Hip adduction    Hip internal rotation    Hip external rotation    Knee flexion  110 AROM supine heel slide c pain anterior knee  Knee extension  -3 in seated LAQ AROM  0 PROM in supine heel prop  Ankle dorsiflexion    Ankle plantarflexion    Ankle inversion    Ankle eversion     (Blank rows = not tested)  LE MMT:  MMT Right 12/31/2021 Left 12/31/2021  Hip flexion 5/5 5/5  Hip extension    Hip abduction    Hip adduction    Hip internal rotation    Hip external rotation    Knee flexion 5/5 4/5  Knee extension 5/5 32.9, 31.9 lbs 4/5 23.5, 19.5 lbs c pain  Ankle dorsiflexion 5/5 5/5  Ankle plantarflexion    Ankle inversion    Ankle eversion     (Blank rows = not tested)   FUNCTIONAL TESTS:  12/31/2021 TUG: independent 10.64 seconds  Lt SLS:  3 seconds   Rt SLS: 5 seconds  18 inch transfer s UE assist: on first try with increased difficulty/pain in mid range Lt knee  GAIT: 12/31/2021 Distance walked: Household distances within clinic  Assistive device utilized: Independent  Comments: Mild maintained knee flexion in stance Lt leg, mild  reduction of stance on Lt leg.     TODAY'S TREATMENT: 01/04/2022 Therex: Nustep Lvl 5 UE/LE 8 mins Leg press double leg 75 lbs x 15, single leg x 15 31 lbs, performed bilaterally Incline stretch bilateral gastroc 30 sec x 3 (cues for home use) Seated Lt LAQ 4 lbs 2 x 15 Additional time spent in review of HEP verbally.   Neuro Re-ed Tandem stance 1 min x 1 bilateral c occasional hand assist on bar Anterior/posterior [employer] pew weight shift on foam 2 mins Additional time spent c instruction of activity.   12/31/2021 Therex: HEP instruction/performance c cues for techniques, handout provided.  Trial set performed of each for  comprehension and symptom assessment.  See below for exercise list.  Manual: Lt knee flexion c distraction/IR mobilization with movement.     PATIENT EDUCATION:  12/31/2021 Education details: HEP, POC Person educated: Patient Education method: Consulting civil engineer, Demonstration, Verbal cues, and Handouts Education comprehension: verbalized understanding, returned demonstration, and verbal cues required   HOME EXERCISE PROGRAM: Access Code: [email or web address] URL: [email or web address] Date: 12/31/2021 Prepared by: Scot Jun  Exercises - Supine Quadricep Sets  - 3-5 x daily - 7 x weekly - 1 sets - 10 reps - 5 hold - Supine Heel Slide (Mirrored)  - 3-5 x daily - 7 x weekly - 1 sets - 10 reps - 2 hold - Seated Quad Set (Mirrored)  - 3-5 x daily - 7 x weekly - 1 sets - 10 reps - 5 hold - Seated Long Arc Quad (Mirrored)  - 3-5 x daily - 7 x weekly - 1 sets - 5-10 reps - 2 hold - Seated Straight Leg Heel Taps  - 1-2 x daily - 7 x weekly - 1-2 sets - 5-10 reps - Sit to Stand  - 3 x daily - 7 x weekly - 1 sets - 10 reps - Seated Hamstring Stretch  - 1-2 x daily - 7 x weekly - 1 sets - 3-5 reps - 15 hold  ASSESSMENT:  CLINICAL IMPRESSION: Mild reduction in pain was positive from evaluation to today.  Continued overall strengthening program to benefit Pt.  Ability to perform activity c reduced difficulty/symptoms.    OBJECTIVE IMPAIRMENTS Abnormal gait, decreased activity tolerance, decreased balance, decreased coordination, decreased endurance, decreased mobility, difficulty walking, decreased ROM, decreased strength, hypomobility, increased edema, impaired perceived functional ability, impaired flexibility, improper body mechanics, and pain.   ACTIVITY LIMITATIONS cleaning, community activity, meal prep, laundry, and shopping.   PERSONAL FACTORS  HTN, hyperlipidemia, GERD, DM, RA, length of time since onset  are also affecting patient's functional outcome.    REHAB POTENTIAL: Fair to good  CLINICAL DECISION MAKING: Stable/uncomplicated  EVALUATION COMPLEXITY: Low   GOALS: Goals reviewed with patient? Yes Eval: 07/07/2021 Short term PT Goals (target date for Short term goals are 3 weeks 01/21/2022) Patient will demonstrate independent use of home exercise program to maintain progress from in clinic treatments. Goal status: on going - assessed 01/04/2022   Long term PT goals (target dates for all long term goals are 10 weeks  03/11/2022 )  1. Patient will demonstrate/report pain at worst less than or equal to 2/10 to facilitate minimal limitation in daily activity secondary to pain symptoms. Goal status: New  2. Patient will demonstrate independent use of home exercise program to facilitate ability to maintain/progress functional gains from skilled physical therapy services. Goal status: New  3. Patient will demonstrate FOTO outcome > or = 71 % to indicate reduced disability due to condition. Goal status: New  4.  Patient will demonstrate Lt knee AROM 0-115 degrees s symptoms to facilitate ability to perform transfers, sitting, ambulation, stair navigation s restriction due to mobility. Goal status: New  5.  Patient will demonstrate bilateral LE MMT 5/5, dynamometry improvement of > = 10 lbs to facilitate ability to perform usual  standing, walking, stairs at PLOF s limitation due to symptoms.   Goal status: New  6.  Patient will demonstrate/report ability to ascend/descend stairs c reciprocal gait pattern for household entry.   Goal status: New  7.  Patient will demonstrate/report ability to return to gym workout routine.  a.  Goal Status: New  PLAN: PT FREQUENCY: 1-2x/week  PT DURATION: 10 weeks  PLANNED INTERVENTIONS:  Therapeutic exercises, Therapeutic activity, Neuro Muscular re-education, Balance training, Gait training, Patient/Family education, Joint mobilization, Stair training, DME instructions, Dry Needling, Electrical stimulation, Cryotherapy, Moist heat, Taping, Ultrasound, Ionotophoresis 34m/ml Dexamethasone, and Manual therapy.  All included unless contraindicated  PLAN FOR NEXT SESSION: Quad strengthening progressively, static balance improvements.  Monitor symptoms.    MScot Jun PT, DPT, OCS, ATC 01/04/22  3:09 PM

## 2022-01-04 NOTE — Telephone Encounter (Signed)
Attempted to call pt as she missed her PT appt today.   Unable to leave a message.  Laureen Abrahams, PT, DPT 01/04/22 9:06 AM

## 2022-01-08 ENCOUNTER — Ambulatory Visit: Payer: Medicare Other | Admitting: Cardiology

## 2022-01-11 ENCOUNTER — Ambulatory Visit (INDEPENDENT_AMBULATORY_CARE_PROVIDER_SITE_OTHER): Payer: Medicare Other | Admitting: Physical Therapy

## 2022-01-11 ENCOUNTER — Encounter: Payer: Self-pay | Admitting: Physical Therapy

## 2022-01-11 DIAGNOSIS — R262 Difficulty in walking, not elsewhere classified: Secondary | ICD-10-CM

## 2022-01-11 DIAGNOSIS — M6281 Muscle weakness (generalized): Secondary | ICD-10-CM | POA: Diagnosis not present

## 2022-01-11 DIAGNOSIS — M25662 Stiffness of left knee, not elsewhere classified: Secondary | ICD-10-CM | POA: Diagnosis not present

## 2022-01-11 DIAGNOSIS — G8929 Other chronic pain: Secondary | ICD-10-CM

## 2022-01-11 DIAGNOSIS — R2689 Other abnormalities of gait and mobility: Secondary | ICD-10-CM | POA: Diagnosis not present

## 2022-01-11 DIAGNOSIS — R600 Localized edema: Secondary | ICD-10-CM | POA: Diagnosis not present

## 2022-01-11 DIAGNOSIS — M25562 Pain in left knee: Secondary | ICD-10-CM | POA: Diagnosis not present

## 2022-01-11 NOTE — Therapy (Signed)
OUTPATIENT PHYSICAL THERAPY EVALUATION   Patient Name: Leslie Davis MRN: [id number] DOB:01-28-1956, 66 y.o., female Today's Date: 01/11/2022  END OF SESSION:   PT End of Session - 01/11/22 0932     Visit Number 3    Number of Visits 20    Date for PT Re-Evaluation 03/11/22    Authorization Type UHC Medicare 20% co insurance    Progress Note Due on Visit 10    PT Start Time 0930    PT Stop Time 1010    PT Time Calculation (min) 40 min    Activity Tolerance Patient tolerated treatment well    Behavior During Therapy WFL for tasks assessed/performed                Past Medical History:  Diagnosis Date   DM (diabetes mellitus) ([place])    Fatty liver disease, nonalcoholic    Gastritis    Gastroparesis    GERD (gastroesophageal reflux disease)    Heartburn    Hiatal hernia    HLD (hyperlipidemia)    HTN (hypertension)    Joint pain    Renal calculus    Rheumatoid arthritis ([place])    Vitamin D deficiency    Past Surgical History:  Procedure Laterality Date   CHOLECYSTECTOMY     COLONOSCOPY  09/10/2011   normal   LEFT HEART CATH AND CORONARY ANGIOGRAPHY N/A 08/18/2021   Procedure: LEFT HEART CATH AND CORONARY ANGIOGRAPHY;  Surgeon: Nigel Mormon, MD;  Location: [facility];  Service: Cardiovascular;  Laterality: N/A;   TOTAL KNEE ARTHROPLASTY Left 10/27/2020   Procedure: LEFT TOTAL KNEE ARTHROPLASTY;  Surgeon: [name], MD;  Location: [facility];  Service: Orthopedics;  Laterality: Left;   TUBAL LIGATION     UPPER GASTROINTESTINAL ENDOSCOPY     Patient Active Problem List   Diagnosis Date Noted   Coronary artery disease of native artery of native heart with stable angina pectoris ([place])    Other constipation 05/25/2021   Class 2 severe obesity with serious comorbidity and body mass index (BMI) of 35.0 to 35.9 in adult ([place]) 12/01/2020   Status post total left knee replacement 10/27/2020   Primary osteoarthritis of left knee    NAFLD  (nonalcoholic fatty liver disease) 08/06/2020   Other fatigue 07/23/2020   SOBOE (shortness of breath on exertion) 07/23/2020   Diabetes mellitus ([place]) 07/23/2020   Hypertension associated with type 2 diabetes mellitus ([place]) 07/23/2020   Hyperlipidemia associated with type 2 diabetes mellitus ([place]) 07/23/2020   Absolute anemia 07/23/2020   Vitamin D deficiency 07/23/2020   At risk for heart disease 07/23/2020   AKI (acute kidney injury) ([place]) 09/07/2019   Acute kidney injury ([place]) 09/06/2019   Bradycardia 09/06/2019   Hyperglycemia 09/06/2019   HLD (hyperlipidemia)    Hypertensive nephropathy 05/16/2019   Erosive gastritis 11/16/2017   Fatty liver 11/16/2017   Abdominal pain 12/11/2012   Early satiety 12/11/2012   Hiatal hernia    Hx of cholecystectomy 02/05/2011   Flu-like symptoms 02/05/2011   CONSTIPATION 08/27/2008   FATTY LIVER DISEASE 05/07/2008   Nausea with vomiting 04/23/2008   EPIGASTRIC PAIN 04/23/2008   RENAL CALCULUS 04/22/2008   Type 2 diabetes mellitus with stage 2 chronic kidney disease, with long-term current use of insulin ([place]) 05/19/2006   Type 2 diabetes mellitus with hyperlipidemia ([place]) 05/19/2006   HYPERTENSION, BENIGN SYSTEMIC 05/19/2006   GASTROESOPHAGEAL REFLUX, NO ESOPHAGITIS 05/19/2006   AMENORRHEA 05/19/2006    PCP: [name], MD  REFERRING PROVIDER: [name], MD  REFERRING DIAG: [phone] (ICD-10-CM) - Status post total left knee replacement  THERAPY DIAG:  Chronic pain of left knee  Muscle weakness (generalized)  Difficulty in walking, not elsewhere classified  Localized edema  Stiffness of left knee, not elsewhere classified  Other abnormalities of gait and mobility  ONSET DATE: 10/27/2020 - TKA, acute on chronic a few weeks  SUBJECTIVE:   SUBJECTIVE STATEMENT: Went to the beach this weekend; did a lot of walking.  Knee did well; c/o a little aching but not bad  PERTINENT HISTORY: DM, HTN  PAIN:  NPRS scale:  0/10 Pain location: Lt knee Pain description: chronic  Aggravating factors: walking Relieving factors: medicine with very painful  PRECAUTIONS: None  WEIGHT BEARING RESTRICTIONS No  FALLS:  Has patient fallen in last 6 months? 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      Additional Comments 3 steps to enter-has handrail on Rt side     OCCUPATION: Retired  PLOF:  Level of [employer] Retired     Leisure works out at gym     PATIENT GOALS Reduce pain, walking    OBJECTIVE:   PATIENT SURVEYS:  12/31/2021: FOTO intake: 51  predicted:  71  COGNITION: 12/31/2021 Overall cognitive status: Within functional limits for tasks assessed     PALPATION:  12/31/2021 : Mild tenderness to touch anterior knee joint, inferior patella Lt  LE ROM:  Active ROM Right 12/31/2021 Left 12/31/2021  Knee flexion  110 AROM supine heel slide c pain anterior knee  Knee extension  -3 in seated LAQ AROM  0 PROM in supine heel prop   (Blank rows = not tested)  LE MMT:  MMT Right 12/31/2021 Left 12/31/2021  Hip flexion 5/5 5/5  Hip extension    Hip abduction    Hip adduction    Hip internal rotation    Hip external rotation    Knee flexion 5/5 4/5  Knee extension 5/5 32.9, 31.9 lbs 4/5 23.5, 19.5 lbs c pain  Ankle dorsiflexion 5/5 5/5  Ankle plantarflexion    Ankle inversion    Ankle eversion     (Blank rows = not tested)   FUNCTIONAL TESTS:  12/31/2021 TUG: independent 10.64 seconds Lt SLS:  3 seconds   Rt SLS: 5 seconds 18 inch transfer s UE assist: on first try with increased difficulty/pain in mid range Lt knee  GAIT: 12/31/2021 Distance walked: Household distances within clinic  Assistive device utilized: Independent  Comments: Mild maintained knee flexion in stance Lt leg, mild reduction of stance on Lt leg.     TODAY'S TREATMENT: 01/11/22 Therex: Nustep Lvl 6 UE/LE 8 mins Leg Press 75# 3x10; single limb  50# performed bil 3x10 Knee extension machine 10# 3x10 Knee flexion machine 20# 3x10 Sidestepping with L3 band around ankles x 5 laps at counter  Neuro Re-Ed SLS with intermittent UE support 5x20 sec bil Tandem walking at counter (intermittent UE support) x 5 laps   01/04/2022 Therex: Nustep Lvl 5 UE/LE 8 mins Leg press double leg 75 lbs x 15, single leg x 15 31 lbs, performed bilaterally Incline stretch bilateral gastroc 30 sec x 3 (cues for home use) Seated Lt LAQ 4 lbs 2 x 15 Additional time spent in review of HEP verbally.   Neuro Re-ed Tandem stance 1 min x 1 bilateral c occasional hand assist on bar Anterior/posterior [employer] pew weight shift on foam 2 mins Additional  time spent c instruction of activity.   12/31/2021 Therex: HEP instruction/performance c cues for techniques, handout provided.  Trial set performed of each for comprehension and symptom assessment.  See below for exercise list.  Manual: Lt knee flexion c distraction/IR mobilization with movement.     PATIENT EDUCATION:  12/31/2021 Education details: HEP, POC Person educated: Patient Education method: Consulting civil engineer, Demonstration, Verbal cues, and Handouts Education comprehension: verbalized understanding, returned demonstration, and verbal cues required   HOME EXERCISE PROGRAM: Access Code: [email or web address] URL: [email or web address] Date: 12/31/2021 Prepared by: Scot Jun  Exercises - Supine Quadricep Sets  - 3-5 x daily - 7 x weekly - 1 sets - 10 reps - 5 hold - Supine Heel Slide (Mirrored)  - 3-5 x daily - 7 x weekly - 1 sets - 10 reps - 2 hold - Seated Quad Set (Mirrored)  - 3-5 x daily - 7 x weekly - 1 sets - 10 reps - 5 hold - Seated Long Arc Quad (Mirrored)  - 3-5 x daily - 7 x weekly - 1 sets - 5-10 reps - 2 hold - Seated Straight Leg Heel Taps  - 1-2 x daily - 7 x weekly - 1-2 sets - 5-10 reps - Sit to Stand  - 3 x daily - 7 x weekly - 1 sets - 10 reps - Seated Hamstring  Stretch  - 1-2 x daily - 7 x weekly - 1 sets - 3-5 reps - 15 hold  ASSESSMENT:  CLINICAL IMPRESSION: Pt tolerated session well today without reports of pain.  Able to progress balance and strengthening exercises today.  Will continue to benefit from PT to maximize function.  OBJECTIVE IMPAIRMENTS Abnormal gait, decreased activity tolerance, decreased balance, decreased coordination, decreased endurance, decreased mobility, difficulty walking, decreased ROM, decreased strength, hypomobility, increased edema, impaired perceived functional ability, impaired flexibility, improper body mechanics, and pain.   ACTIVITY LIMITATIONS cleaning, community activity, meal prep, laundry, and shopping.   PERSONAL FACTORS  HTN, hyperlipidemia, GERD, DM, RA, length of time since onset  are also affecting patient's functional outcome.    REHAB POTENTIAL: Fair to good  CLINICAL DECISION MAKING: Stable/uncomplicated  EVALUATION COMPLEXITY: Low   GOALS: Goals reviewed with patient? Yes Eval: 07/07/2021 Short term PT Goals (target date for Short term goals are 3 weeks 01/21/2022) Patient will demonstrate independent use of home exercise program to maintain progress from in clinic treatments. Goal status: on going - assessed 01/04/2022   Long term PT goals (target dates for all long term goals are 10 weeks  03/11/2022 )  1. Patient will demonstrate/report pain at worst less than or equal to 2/10 to facilitate minimal limitation in daily activity secondary to pain symptoms. Goal status: New  2. Patient will demonstrate independent use of home exercise program to facilitate ability to maintain/progress functional gains from skilled physical therapy services. Goal status: New  3. Patient will demonstrate FOTO outcome > or = 71 % to indicate reduced disability due to condition. Goal status: New  4.  Patient will demonstrate Lt knee AROM 0-115 degrees s symptoms to facilitate ability to perform transfers,  sitting, ambulation, stair navigation s restriction due to mobility. Goal status: New  5.  Patient will demonstrate bilateral LE MMT 5/5, dynamometry improvement of > = 10 lbs to facilitate ability to perform usual standing, walking, stairs at PLOF s limitation due to symptoms.   Goal status: New  6.  Patient will demonstrate/report ability to ascend/descend stairs c reciprocal gait pattern for household entry.  Goal status: New  7.  Patient will demonstrate/report ability to return to gym workout routine.  a.  Goal Status: New   PLAN: PT FREQUENCY: 1-2x/week  PT DURATION: 10 weeks  PLANNED INTERVENTIONS:  Therapeutic exercises, Therapeutic activity, Neuro Muscular re-education, Balance training, Gait training, Patient/Family education, Joint mobilization, Stair training, DME instructions, Dry Needling, Electrical stimulation, Cryotherapy, Moist heat, Taping, Ultrasound, Ionotophoresis 71m/ml Dexamethasone, and Manual therapy.  All included unless contraindicated  PLAN FOR NEXT SESSION: Quad strengthening progressively, static balance improvements.    SLaureen Abrahams PT, DPT 01/11/22 10:12 AM

## 2022-01-12 ENCOUNTER — Ambulatory Visit: Payer: Medicare Other

## 2022-01-14 ENCOUNTER — Ambulatory Visit (INDEPENDENT_AMBULATORY_CARE_PROVIDER_SITE_OTHER): Payer: Medicare Other | Admitting: Family Medicine

## 2022-01-14 ENCOUNTER — Ambulatory Visit: Payer: Medicare Other

## 2022-01-14 ENCOUNTER — Ambulatory Visit: Payer: Medicare Other | Admitting: Internal Medicine

## 2022-01-15 ENCOUNTER — Other Ambulatory Visit: Payer: Self-pay | Admitting: Cardiology

## 2022-01-15 DIAGNOSIS — I2510 Atherosclerotic heart disease of native coronary artery without angina pectoris: Secondary | ICD-10-CM

## 2022-01-18 ENCOUNTER — Ambulatory Visit (INDEPENDENT_AMBULATORY_CARE_PROVIDER_SITE_OTHER): Payer: Medicare Other | Admitting: Rehabilitative and Restorative Service Providers"

## 2022-01-18 ENCOUNTER — Encounter: Payer: Self-pay | Admitting: Rehabilitative and Restorative Service Providers"

## 2022-01-18 DIAGNOSIS — R600 Localized edema: Secondary | ICD-10-CM | POA: Diagnosis not present

## 2022-01-18 DIAGNOSIS — G8929 Other chronic pain: Secondary | ICD-10-CM

## 2022-01-18 DIAGNOSIS — R262 Difficulty in walking, not elsewhere classified: Secondary | ICD-10-CM

## 2022-01-18 DIAGNOSIS — M25562 Pain in left knee: Secondary | ICD-10-CM

## 2022-01-18 DIAGNOSIS — M6281 Muscle weakness (generalized): Secondary | ICD-10-CM | POA: Diagnosis not present

## 2022-01-18 NOTE — Therapy (Signed)
OUTPATIENT PHYSICAL THERAPY EVALUATION   Patient Name: XIAMARA HULET MRN: [id number] DOB:May 23, 1955, 66 y.o., female Today's Date: 01/18/2022  END OF SESSION:   PT End of Session - 01/18/22 1029     Visit Number 4    Number of Visits 20    Date for PT Re-Evaluation 03/11/22    Authorization Type UHC Medicare 20% co insurance    Progress Note Due on Visit 10    PT Start Time 1015    PT Stop Time 1055    PT Time Calculation (min) 40 min    Activity Tolerance Patient tolerated treatment well    Behavior During Therapy WFL for tasks assessed/performed                 Past Medical History:  Diagnosis Date   DM (diabetes mellitus) ([place])    Fatty liver disease, nonalcoholic    Gastritis    Gastroparesis    GERD (gastroesophageal reflux disease)    Heartburn    Hiatal hernia    HLD (hyperlipidemia)    HTN (hypertension)    Joint pain    Renal calculus    Rheumatoid arthritis ([place])    Vitamin D deficiency    Past Surgical History:  Procedure Laterality Date   CHOLECYSTECTOMY     COLONOSCOPY  09/10/2011   normal   LEFT HEART CATH AND CORONARY ANGIOGRAPHY N/A 08/18/2021   Procedure: LEFT HEART CATH AND CORONARY ANGIOGRAPHY;  Surgeon: Nigel Mormon, MD;  Location: [facility];  Service: Cardiovascular;  Laterality: N/A;   TOTAL KNEE ARTHROPLASTY Left 10/27/2020   Procedure: LEFT TOTAL KNEE ARTHROPLASTY;  Surgeon: [name], MD;  Location: [facility];  Service: Orthopedics;  Laterality: Left;   TUBAL LIGATION     UPPER GASTROINTESTINAL ENDOSCOPY     Patient Active Problem List   Diagnosis Date Noted   Coronary artery disease of native artery of native heart with stable angina pectoris ([place])    Other constipation 05/25/2021   Class 2 severe obesity with serious comorbidity and body mass index (BMI) of 35.0 to 35.9 in adult ([place]) 12/01/2020   Status post total left knee replacement 10/27/2020   Primary osteoarthritis of left knee    NAFLD  (nonalcoholic fatty liver disease) 08/06/2020   Other fatigue 07/23/2020   SOBOE (shortness of breath on exertion) 07/23/2020   Diabetes mellitus ([place]) 07/23/2020   Hypertension associated with type 2 diabetes mellitus ([place]) 07/23/2020   Hyperlipidemia associated with type 2 diabetes mellitus ([place]) 07/23/2020   Absolute anemia 07/23/2020   Vitamin D deficiency 07/23/2020   At risk for heart disease 07/23/2020   AKI (acute kidney injury) ([place]) 09/07/2019   Acute kidney injury ([place]) 09/06/2019   Bradycardia 09/06/2019   Hyperglycemia 09/06/2019   HLD (hyperlipidemia)    Hypertensive nephropathy 05/16/2019   Erosive gastritis 11/16/2017   Fatty liver 11/16/2017   Abdominal pain 12/11/2012   Early satiety 12/11/2012   Hiatal hernia    Hx of cholecystectomy 02/05/2011   Flu-like symptoms 02/05/2011   CONSTIPATION 08/27/2008   FATTY LIVER DISEASE 05/07/2008   Nausea with vomiting 04/23/2008   EPIGASTRIC PAIN 04/23/2008   RENAL CALCULUS 04/22/2008   Type 2 diabetes mellitus with stage 2 chronic kidney disease, with long-term current use of insulin ([place]) 05/19/2006   Type 2 diabetes mellitus with hyperlipidemia ([place]) 05/19/2006   HYPERTENSION, BENIGN SYSTEMIC 05/19/2006   GASTROESOPHAGEAL REFLUX, NO ESOPHAGITIS 05/19/2006   AMENORRHEA 05/19/2006    PCP: [name],  MD  REFERRING PROVIDER: [name], MD  REFERRING DIAG: [phone] (ICD-10-CM) - Status post total left knee replacement  THERAPY DIAG:  Chronic pain of left knee  Muscle weakness (generalized)  Difficulty in walking, not elsewhere classified  Localized edema  ONSET DATE: 10/27/2020 - TKA, acute on chronic a few weeks  SUBJECTIVE:   SUBJECTIVE STATEMENT: Pt indicated no particular pain today upon arrival.    PERTINENT HISTORY: DM, HTN  PAIN:  NPRS scale: 0/10 at current, 4/10 Pain location: Lt knee Pain description: chronic  Aggravating factors: walking Relieving factors: medicine with very  painful  PRECAUTIONS: None  WEIGHT BEARING RESTRICTIONS No  FALLS:  Has patient fallen in last 6 months? 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      Additional Comments 3 steps to enter-has handrail on Rt side     OCCUPATION: Retired  PLOF:  Level of [place] Retired     Leisure works out at gym     PATIENT GOALS Reduce pain, walking    OBJECTIVE:   PATIENT SURVEYS:  12/31/2021: FOTO intake: 51  predicted:  71  COGNITION: 12/31/2021 Overall cognitive status: Within functional limits for tasks assessed     PALPATION:  12/31/2021 : Mild tenderness to touch anterior knee joint, inferior patella Lt  LE ROM:  Active ROM Right 12/31/2021 Left 12/31/2021  Knee flexion  110 AROM supine heel slide c pain anterior knee  Knee extension  -3 in seated LAQ AROM  0 PROM in supine heel prop   (Blank rows = not tested)  LE MMT:  MMT Right 12/31/2021 Left 12/31/2021 Left 01/18/2022  Hip flexion 5/5 5/5   Hip extension     Hip abduction     Hip adduction     Hip internal rotation     Hip external rotation     Knee flexion 5/5 4/5   Knee extension 5/5 32.9, 31.9 lbs 4/5 23.5, 19.5 lbs c pain 4/5 24.7, 22.1 lbs  Ankle dorsiflexion 5/5 5/5   Ankle plantarflexion     Ankle inversion     Ankle eversion      (Blank rows = not tested)   FUNCTIONAL TESTS:  12/31/2021 TUG: independent 10.64 seconds Lt SLS:  3 seconds   Rt SLS: 5 seconds 18 inch transfer s UE assist: on first try with increased difficulty/pain in mid range Lt knee  GAIT: 12/31/2021 Distance walked: Household distances within clinic  Assistive device utilized: Independent  Comments: Mild maintained knee flexion in stance Lt leg, mild reduction of stance on Lt leg.     TODAY'S TREATMENT: 01/18/2022 Therex: Recumbent bike lvl 2 8 mins RPM around 50 Leg press 75 lbs x 15, single leg 2 x 15 50 lbs, performed bilaterally Incline calf stretch  30 sec x 3 bilateral Step up 6 inch fwd x 15 , performed bilaterally Seated SLR Lt x 10 c 2-3 sec hold in mid range Knee extension machine - double leg up, Lt leg lowering slowly 2 x 10 10 lbs   01/11/22 Therex: Nustep Lvl 6 UE/LE 8 mins Leg Press 75# 3x10; single limb 50# performed bil 3x10 Knee extension machine 10# 3x10 Knee flexion machine 20# 3x10 Sidestepping with L3 band around ankles x 5 laps at counter  Neuro Re-Ed SLS with intermittent UE support 5x20 sec bil Tandem walking at counter (intermittent UE support) x 5 laps   01/04/2022 Therex: Nustep Lvl 5 UE/LE 8 mins Leg press  double leg 75 lbs x 15, single leg x 15 31 lbs, performed bilaterally Incline stretch bilateral gastroc 30 sec x 3 (cues for home use) Seated Lt LAQ 4 lbs 2 x 15 Additional time spent in review of HEP verbally.   Neuro Re-ed Tandem stance 1 min x 1 bilateral c occasional hand assist on bar Anterior/posterior [employer] pew weight shift on foam 2 mins Additional time spent c instruction of activity.     PATIENT EDUCATION:  12/31/2021 Education details: HEP, POC Person educated: Patient Education method: Consulting civil engineer, Demonstration, Verbal cues, and Handouts Education comprehension: verbalized understanding, returned demonstration, and verbal cues required   HOME EXERCISE PROGRAM: Access Code: [email or web address] URL: [email or web address] Date: 12/31/2021 Prepared by: Scot Jun  Exercises - Supine Quadricep Sets  - 3-5 x daily - 7 x weekly - 1 sets - 10 reps - 5 hold - Supine Heel Slide (Mirrored)  - 3-5 x daily - 7 x weekly - 1 sets - 10 reps - 2 hold - Seated Quad Set (Mirrored)  - 3-5 x daily - 7 x weekly - 1 sets - 10 reps - 5 hold - Seated Long Arc Quad (Mirrored)  - 3-5 x daily - 7 x weekly - 1 sets - 5-10 reps - 2 hold - Seated Straight Leg Heel Taps  - 1-2 x daily - 7 x weekly - 1-2 sets - 5-10 reps - Sit to Stand  - 3 x daily - 7 x weekly - 1 sets - 10 reps - Seated  Hamstring Stretch  - 1-2 x daily - 7 x weekly - 1 sets - 3-5 reps - 15 hold  ASSESSMENT:  CLINICAL IMPRESSION: WB activity including step up for normal step height proved increased trouble c Lt leg secondary to weakness.  Eccentric control lacking.  Strength slightly improved from evaluation.   OBJECTIVE IMPAIRMENTS Abnormal gait, decreased activity tolerance, decreased balance, decreased coordination, decreased endurance, decreased mobility, difficulty walking, decreased ROM, decreased strength, hypomobility, increased edema, impaired perceived functional ability, impaired flexibility, improper body mechanics, and pain.   ACTIVITY LIMITATIONS cleaning, community activity, meal prep, laundry, and shopping.   PERSONAL FACTORS  HTN, hyperlipidemia, GERD, DM, RA, length of time since onset  are also affecting patient's functional outcome.    REHAB POTENTIAL: Fair to good  CLINICAL DECISION MAKING: Stable/uncomplicated  EVALUATION COMPLEXITY: Low   GOALS: Goals reviewed with patient? Yes Eval: 07/07/2021 Short term PT Goals (target date for Short term goals are 3 weeks 01/21/2022) Patient will demonstrate independent use of home exercise program to maintain progress from in clinic treatments. Goal status: Met - 01/18/2022   Long term PT goals (target dates for all long term goals are 10 weeks  03/11/2022 )  1. Patient will demonstrate/report pain at worst less than or equal to 2/10 to facilitate minimal limitation in daily activity secondary to pain symptoms. Goal status: on going 01/18/2022  2. Patient will demonstrate independent use of home exercise program to facilitate ability to maintain/progress functional gains from skilled physical therapy services. Goal status: on going 01/18/2022  3. Patient will demonstrate FOTO outcome > or = 71 % to indicate reduced disability due to condition. Goal status: on going 01/18/2022  4.  Patient will demonstrate Lt knee AROM 0-115 degrees s  symptoms to facilitate ability to perform transfers, sitting, ambulation, stair navigation s restriction due to mobility. Goal status: on going 01/18/2022  5.  Patient will demonstrate bilateral LE MMT 5/5, dynamometry improvement of > = 10 lbs to  facilitate ability to perform usual standing, walking, stairs at PLOF s limitation due to symptoms.   Goal status: on going 01/18/2022  6.  Patient will demonstrate/report ability to ascend/descend stairs c reciprocal gait pattern for household entry.   Goal status: on going 01/18/2022  7.  Patient will demonstrate/report ability to return to gym workout routine.  a.  Goal Status: on going 01/18/2022   PLAN: PT FREQUENCY: 1-2x/week  PT DURATION: 10 weeks  PLANNED INTERVENTIONS:  Therapeutic exercises, Therapeutic activity, Neuro Muscular re-education, Balance training, Gait training, Patient/Family education, Joint mobilization, Stair training, DME instructions, Dry Needling, Electrical stimulation, Cryotherapy, Moist heat, Taping, Ultrasound, Ionotophoresis 67m/ml Dexamethasone, and Manual therapy.  All included unless contraindicated  PLAN FOR NEXT SESSION: Continue controlled strengthening to improve WB ability in Lt leg.    MScot Jun PT, DPT, OCS, ATC 01/18/22  10:52 AM

## 2022-01-25 ENCOUNTER — Ambulatory Visit (INDEPENDENT_AMBULATORY_CARE_PROVIDER_SITE_OTHER): Payer: Medicare Other | Admitting: Physical Therapy

## 2022-01-25 ENCOUNTER — Telehealth: Payer: Self-pay

## 2022-01-25 ENCOUNTER — Encounter: Payer: Self-pay | Admitting: Physical Therapy

## 2022-01-25 DIAGNOSIS — R262 Difficulty in walking, not elsewhere classified: Secondary | ICD-10-CM

## 2022-01-25 DIAGNOSIS — R600 Localized edema: Secondary | ICD-10-CM

## 2022-01-25 DIAGNOSIS — G8929 Other chronic pain: Secondary | ICD-10-CM

## 2022-01-25 DIAGNOSIS — M6281 Muscle weakness (generalized): Secondary | ICD-10-CM

## 2022-01-25 DIAGNOSIS — M25562 Pain in left knee: Secondary | ICD-10-CM | POA: Diagnosis not present

## 2022-01-25 NOTE — Therapy (Signed)
OUTPATIENT PHYSICAL THERAPY EVALUATION   Patient Name: Leslie Davis MRN: [id number] DOB:Apr 28, 1955, 66 y.o., female Today's Date: 01/25/2022  END OF SESSION:   PT End of Session - 01/25/22 0934     Visit Number 5    Number of Visits 20    Date for PT Re-Evaluation 03/11/22    Authorization Type [employer] Medicare 20% co insurance    Progress Note Due on Visit 10    PT Start Time 0930    PT Stop Time 1010    PT Time Calculation (min) 40 min    Activity Tolerance Patient tolerated treatment well    Behavior During Therapy WFL for tasks assessed/performed                  Past Medical History:  Diagnosis Date   DM (diabetes mellitus) ([place])    Fatty liver disease, nonalcoholic    Gastritis    Gastroparesis    GERD (gastroesophageal reflux disease)    Heartburn    Hiatal hernia    HLD (hyperlipidemia)    HTN (hypertension)    Joint pain    Renal calculus    Rheumatoid arthritis ([place])    Vitamin D deficiency    Past Surgical History:  Procedure Laterality Date   CHOLECYSTECTOMY     COLONOSCOPY  09/10/2011   normal   LEFT HEART CATH AND CORONARY ANGIOGRAPHY N/A 08/18/2021   Procedure: LEFT HEART CATH AND CORONARY ANGIOGRAPHY;  Surgeon: Nigel Mormon, MD;  Location: [facility];  Service: Cardiovascular;  Laterality: N/A;   TOTAL KNEE ARTHROPLASTY Left 10/27/2020   Procedure: LEFT TOTAL KNEE ARTHROPLASTY;  Surgeon: [name], MD;  Location: [facility];  Service: Orthopedics;  Laterality: Left;   TUBAL LIGATION     UPPER GASTROINTESTINAL ENDOSCOPY     Patient Active Problem List   Diagnosis Date Noted   Coronary artery disease of native artery of native heart with stable angina pectoris ([place])    Other constipation 05/25/2021   Class 2 severe obesity with serious comorbidity and body mass index (BMI) of 35.0 to 35.9 in adult ([place]) 12/01/2020   Status post total left knee replacement 10/27/2020   Primary osteoarthritis of left knee    NAFLD  (nonalcoholic fatty liver disease) 08/06/2020   Other fatigue 07/23/2020   SOBOE (shortness of breath on exertion) 07/23/2020   Diabetes mellitus ([place]) 07/23/2020   Hypertension associated with type 2 diabetes mellitus ([place]) 07/23/2020   Hyperlipidemia associated with type 2 diabetes mellitus ([place]) 07/23/2020   Absolute anemia 07/23/2020   Vitamin D deficiency 07/23/2020   At risk for heart disease 07/23/2020   AKI (acute kidney injury) ([place]) 09/07/2019   Acute kidney injury ([place]) 09/06/2019   Bradycardia 09/06/2019   Hyperglycemia 09/06/2019   HLD (hyperlipidemia)    Hypertensive nephropathy 05/16/2019   Erosive gastritis 11/16/2017   Fatty liver 11/16/2017   Abdominal pain 12/11/2012   Early satiety 12/11/2012   Hiatal hernia    Hx of cholecystectomy 02/05/2011   Flu-like symptoms 02/05/2011   CONSTIPATION 08/27/2008   FATTY LIVER DISEASE 05/07/2008   Nausea with vomiting 04/23/2008   EPIGASTRIC PAIN 04/23/2008   RENAL CALCULUS 04/22/2008   Type 2 diabetes mellitus with stage 2 chronic kidney disease, with long-term current use of insulin ([place]) 05/19/2006   Type 2 diabetes mellitus with hyperlipidemia ([place]) 05/19/2006   HYPERTENSION, BENIGN SYSTEMIC 05/19/2006   GASTROESOPHAGEAL REFLUX, NO ESOPHAGITIS 05/19/2006   AMENORRHEA 05/19/2006    PCP: [facility],  Bailey Mech, MD  REFERRING PROVIDER: [name], MD  REFERRING DIAG: [id number] (ICD-10-CM) - Status post total left knee replacement  THERAPY DIAG:  Chronic pain of left knee  Muscle weakness (generalized)  Difficulty in walking, not elsewhere classified  Localized edema  ONSET DATE: 10/27/2020 - TKA, acute on chronic a few weeks  SUBJECTIVE:   SUBJECTIVE STATEMENT: Doing well, no concerns today.  PERTINENT HISTORY: DM, HTN  PAIN:  NPRS scale: 0/10 at current, 4/10 Pain location: Lt knee Pain description: chronic  Aggravating factors: walking Relieving factors: medicine with very painful  PRECAUTIONS:  None  WEIGHT BEARING RESTRICTIONS No  FALLS:  Has patient fallen in last 6 months? 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      Additional Comments 3 steps to enter-has handrail on Rt side     OCCUPATION: Retired  PLOF:  Level of [place] Retired     Leisure works out at gym     PATIENT GOALS Reduce pain, walking    OBJECTIVE:   PATIENT SURVEYS:  12/31/2021: FOTO intake: 51  predicted:  71  COGNITION: 12/31/2021 Overall cognitive status: Within functional limits for tasks assessed     PALPATION:  12/31/2021 : Mild tenderness to touch anterior knee joint, inferior patella Lt  LE ROM:  Active ROM Right 12/31/2021 Left 12/31/2021  Knee flexion  110 AROM supine heel slide c pain anterior knee  Knee extension  -3 in seated LAQ AROM  0 PROM in supine heel prop   (Blank rows = not tested)  LE MMT:  MMT Right 12/31/2021 Left 12/31/2021 Left 01/18/2022  Hip flexion 5/5 5/5   Hip extension     Hip abduction     Hip adduction     Hip internal rotation     Hip external rotation     Knee flexion 5/5 4/5   Knee extension 5/5 32.9, 31.9 lbs 4/5 23.5, 19.5 lbs c pain 4/5 24.7, 22.1 lbs  Ankle dorsiflexion 5/5 5/5   Ankle plantarflexion     Ankle inversion     Ankle eversion      (Blank rows = not tested)   FUNCTIONAL TESTS:  12/31/2021 TUG: independent 10.64 seconds Lt SLS:  3 seconds   Rt SLS: 5 seconds 18 inch transfer s UE assist: on first try with increased difficulty/pain in mid range Lt knee  GAIT: 12/31/2021 Distance walked: Household distances within clinic  Assistive device utilized: Independent  Comments: Mild maintained knee flexion in stance Lt leg, mild reduction of stance on Lt leg.     TODAY'S TREATMENT: 01/25/22 Therex: Nustep Lvl 6 UE/LE 8 mins Knee extension machine 10# 3x10 Knee flexion machine 20# 3x10 Leg press 75 lbs 2x10 single leg bil; 125# 3x10 bil push Step ups onto  8" step 2x10 bil Heel taps off 4" step 2x10 bil; light UE support and mod cues needed Seated SLR Lt 3# 2x10 Sit to/from stand without UE support 2x10   01/18/2022 Therex: Recumbent bike lvl 2 8 mins RPM around 50 Leg press 75 lbs x 15, single leg 2 x 15 50 lbs, performed bilaterally Incline calf stretch 30 sec x 3 bilateral Step up 6 inch fwd x 15 , performed bilaterally Seated SLR Lt x 10 c 2-3 sec hold in mid range Knee extension machine - double leg up, Lt leg lowering slowly 2 x 10 10 lbs   01/11/22 Therex: Nustep Lvl 6 UE/LE 8 mins Leg Press 75#  3x10; single limb 50# performed bil 3x10 Knee extension machine 10# 3x10 Knee flexion machine 20# 3x10 Sidestepping with L3 band around ankles x 5 laps at counter  Neuro Re-Ed SLS with intermittent UE support 5x20 sec bil Tandem walking at counter (intermittent UE support) x 5 laps   01/04/2022 Therex: Nustep Lvl 5 UE/LE 8 mins Leg press double leg 75 lbs x 15, single leg x 15 31 lbs, performed bilaterally Incline stretch bilateral gastroc 30 sec x 3 (cues for home use) Seated Lt LAQ 4 lbs 2 x 15 Additional time spent in review of HEP verbally.   Neuro Re-ed Tandem stance 1 min x 1 bilateral c occasional hand assist on bar Anterior/posterior [employer] pew weight shift on foam 2 mins Additional time spent c instruction of activity.     PATIENT EDUCATION:  12/31/2021 Education details: HEP, POC Person educated: Patient Education method: Consulting civil engineer, Demonstration, Verbal cues, and Handouts Education comprehension: verbalized understanding, returned demonstration, and verbal cues required   HOME EXERCISE PROGRAM: Access Code: [email or web address] URL: [email or web address] Date: 12/31/2021 Prepared by: Scot Jun  Exercises - Supine Quadricep Sets  - 3-5 x daily - 7 x weekly - 1 sets - 10 reps - 5 hold - Supine Heel Slide (Mirrored)  - 3-5 x daily - 7 x weekly - 1 sets - 10 reps - 2 hold - Seated Quad Set  (Mirrored)  - 3-5 x daily - 7 x weekly - 1 sets - 10 reps - 5 hold - Seated Long Arc Quad (Mirrored)  - 3-5 x daily - 7 x weekly - 1 sets - 5-10 reps - 2 hold - Seated Straight Leg Heel Taps  - 1-2 x daily - 7 x weekly - 1-2 sets - 5-10 reps - Sit to Stand  - 3 x daily - 7 x weekly - 1 sets - 10 reps - Seated Hamstring Stretch  - 1-2 x daily - 7 x weekly - 1 sets - 3-5 reps - 15 hold  ASSESSMENT:  CLINICAL IMPRESSION: Continued session on strengthening with difficulty with eccentric control.  Recommend at least 3 more weeks of PT to maximize function.  OBJECTIVE IMPAIRMENTS Abnormal gait, decreased activity tolerance, decreased balance, decreased coordination, decreased endurance, decreased mobility, difficulty walking, decreased ROM, decreased strength, hypomobility, increased edema, impaired perceived functional ability, impaired flexibility, improper body mechanics, and pain.   ACTIVITY LIMITATIONS cleaning, community activity, meal prep, laundry, and shopping.   PERSONAL FACTORS  HTN, hyperlipidemia, GERD, DM, RA, length of time since onset  are also affecting patient's functional outcome.    REHAB POTENTIAL: Fair to good  CLINICAL DECISION MAKING: Stable/uncomplicated  EVALUATION COMPLEXITY: Low   GOALS: Goals reviewed with patient? Yes Eval: 07/07/2021 Short term PT Goals (target date for Short term goals are 3 weeks 01/21/2022) Patient will demonstrate independent use of home exercise program to maintain progress from in clinic treatments. Goal status: Met - 01/18/2022   Long term PT goals (target dates for all long term goals are 10 weeks  03/11/2022 )  1. Patient will demonstrate/report pain at worst less than or equal to 2/10 to facilitate minimal limitation in daily activity secondary to pain symptoms. Goal status: on going 01/18/2022  2. Patient will demonstrate independent use of home exercise program to facilitate ability to maintain/progress functional gains from  skilled physical therapy services. Goal status: on going 01/18/2022  3. Patient will demonstrate FOTO outcome > or = 71 % to indicate reduced disability due to condition. Goal status: on  going 01/18/2022  4.  Patient will demonstrate Lt knee AROM 0-115 degrees s symptoms to facilitate ability to perform transfers, sitting, ambulation, stair navigation s restriction due to mobility. Goal status: on going 01/18/2022  5.  Patient will demonstrate bilateral LE MMT 5/5, dynamometry improvement of > = 10 lbs to facilitate ability to perform usual standing, walking, stairs at PLOF s limitation due to symptoms.   Goal status: on going 01/18/2022  6.  Patient will demonstrate/report ability to ascend/descend stairs c reciprocal gait pattern for household entry.   Goal status: on going 01/18/2022  7.  Patient will demonstrate/report ability to return to gym workout routine.  a.  Goal Status: on going 01/18/2022   PLAN: PT FREQUENCY: 1-2x/week  PT DURATION: 10 weeks  PLANNED INTERVENTIONS:  Therapeutic exercises, Therapeutic activity, Neuro Muscular re-education, Balance training, Gait training, Patient/Family education, Joint mobilization, Stair training, DME instructions, Dry Needling, Electrical stimulation, Cryotherapy, Moist heat, Taping, Ultrasound, Ionotophoresis 45m/ml Dexamethasone, and Manual therapy.  All included unless contraindicated  PLAN FOR NEXT SESSION: capture FOTO/ROM,  Continue controlled strengthening to improve WB ability in Lt leg.    SLaureen Abrahams PT, DPT 01/25/22 10:29 AM

## 2022-01-25 NOTE — Chronic Care Management (AMB) (Signed)
01-25-2022: Patient's 01-26-2022 appointment with [name] was rescheduled to 02-10-2022 due to provider being out.  [place] Pharmacist Assistant [phone]

## 2022-01-26 ENCOUNTER — Telehealth: Payer: Medicare Other

## 2022-01-28 NOTE — Progress Notes (Signed)
Called and spoke to [place] at Dr. Gwendolyn Fill office, needing the cardiac clearance for planned surgery. She will follow up and sent note over. Otherwise patient will need a cardiac clearance done since last visit was 10/08/2021 and follow up was to be 3 mo.

## 2022-01-29 ENCOUNTER — Other Ambulatory Visit: Payer: Self-pay

## 2022-01-29 ENCOUNTER — Encounter (HOSPITAL_BASED_OUTPATIENT_CLINIC_OR_DEPARTMENT_OTHER): Payer: Self-pay | Admitting: General Surgery

## 2022-01-29 NOTE — Progress Notes (Signed)
   01/29/22 1229  PAT Phone Screen  Do You Have Diabetes? Yes  Do You Have Hypertension? Yes  Have You Ever Been to the ER for Asthma? No  Have You Taken Oral Steroids in the Past 3 Months? No  Do you Take Phenteramine or any Other Diet Drugs? (S)  Yes (Ozempic weekly)  Recent  Lab Work, EKG, CXR? Yes  Where was this test performed? 08-08-21 EKG  Do you have a history of heart problems? (S)  Yes;Cardiologist (Dr Terri Skains for CAD, HTN)  Have You Ever Had Tests on Your Heart? (S)  Yes  Where? 06-2021 ECHO EF 63%, 08-18-21 Heart cath obstr LAD-treat medically  Any Recent Hospitalizations? No  Height 5' 3"  (1.6 m)  Weight 75.3 kg  Pat Appointment Scheduled (S)  Yes (coming in for BMP)

## 2022-02-01 ENCOUNTER — Encounter (HOSPITAL_BASED_OUTPATIENT_CLINIC_OR_DEPARTMENT_OTHER)
Admission: RE | Admit: 2022-02-01 | Discharge: 2022-02-01 | Disposition: A | Discharge status: Home / Self Care | Payer: Medicare Other | Source: Ambulatory Visit | Attending: General Surgery | Admitting: General Surgery

## 2022-02-01 DIAGNOSIS — Z01818 Encounter for other preprocedural examination: Secondary | ICD-10-CM | POA: Diagnosis not present

## 2022-02-01 LAB — BASIC METABOLIC PANEL
Anion gap: 7 (ref 5–15)
BUN: 24 mg/dL — ABNORMAL HIGH (ref 8–23)
CO2: 24 mmol/L (ref 22–32)
Calcium: 9.3 mg/dL (ref 8.9–10.3)
Chloride: 109 mmol/L (ref 98–111)
Creatinine, Ser: 0.99 mg/dL (ref 0.44–1.00)
GFR, Estimated: >60 mL/min (ref >60)
Glucose, Bld: 90 mg/dL (ref 70–99)
Potassium: 4.7 mmol/L (ref 3.5–5.1)
Sodium: 140 mmol/L (ref 135–145)

## 2022-02-01 NOTE — Progress Notes (Signed)
Surgical soap given with instructions, pt verbalized understanding.Enhanced Recovery after Surgery  Enhanced Recovery after Surgery is a protocol used to improve the stress on your body and your recovery after surgery.  Patient Instructions  The night before surgery:  No food after midnight. ONLY clear liquids after midnight  The day of surgery (if you do NOT have diabetes):  Drink ONE (1) Pre-Surgery Clear Ensure as directed.   This drink was given to you during your hospital  pre-op appointment visit. The pre-op nurse will instruct you on the time to drink the  Pre-Surgery Ensure depending on your surgery time. Finish the drink at the designated time by the pre-op nurse.  Nothing else to drink after completing the  Pre-Surgery Clear Ensure.  The day of surgery (if you have diabetes): Drink ONE (1) Gatorade 2 (G2) as directed. This drink was given to you during your hospital  pre-op appointment visit.  The pre-op nurse will instruct you on the time to drink the   Gatorade 2 (G2) depending on your surgery time. Color of the Gatorade may vary. Red is not allowed. Nothing else to drink after completing the  Gatorade 2 (G2).         If office.you have questions, please contact your surgeon's office

## 2022-02-02 ENCOUNTER — Ambulatory Visit (INDEPENDENT_AMBULATORY_CARE_PROVIDER_SITE_OTHER): Payer: Medicare Other | Admitting: Rehabilitative and Restorative Service Providers"

## 2022-02-02 ENCOUNTER — Encounter: Payer: Medicare Other | Admitting: Physical Therapy

## 2022-02-02 ENCOUNTER — Encounter: Payer: Self-pay | Admitting: Rehabilitative and Restorative Service Providers"

## 2022-02-02 DIAGNOSIS — G8929 Other chronic pain: Secondary | ICD-10-CM

## 2022-02-02 DIAGNOSIS — R600 Localized edema: Secondary | ICD-10-CM | POA: Diagnosis not present

## 2022-02-02 DIAGNOSIS — R262 Difficulty in walking, not elsewhere classified: Secondary | ICD-10-CM | POA: Diagnosis not present

## 2022-02-02 DIAGNOSIS — M25562 Pain in left knee: Secondary | ICD-10-CM | POA: Diagnosis not present

## 2022-02-02 DIAGNOSIS — M6281 Muscle weakness (generalized): Secondary | ICD-10-CM

## 2022-02-02 NOTE — Therapy (Signed)
OUTPATIENT PHYSICAL THERAPY TREATMENT   Patient Name: Leslie Davis MRN: [id number] DOB:[birth year], 66 y.o., female Today's Date: 02/02/2022  END OF SESSION:   PT End of Session - 02/02/22 1510     Visit Number 6    Number of Visits 20    Date for PT Re-Evaluation 03/11/22    Authorization Type UHC Medicare 20% co insurance    Progress Note Due on Visit 10    PT Start Time 1513    PT Stop Time 1552    PT Time Calculation (min) 39 min    Activity Tolerance Patient tolerated treatment well    Behavior During Therapy WFL for tasks assessed/performed                   Past Medical History:  Diagnosis Date   CKD (chronic kidney disease)    stage 3   Coronary artery disease 07/2021   obstructive CAD-70% LAD-treated medically   DM (diabetes mellitus) ([place])    Fatty liver disease, nonalcoholic    Gastritis    Gastroparesis    GERD (gastroesophageal reflux disease)    Heartburn    Hiatal hernia    HLD (hyperlipidemia)    HTN (hypertension)    Joint pain    Renal calculus    Rheumatoid arthritis ([place])    Vitamin D deficiency    Past Surgical History:  Procedure Laterality Date   CHOLECYSTECTOMY     COLONOSCOPY  09/10/2011   normal   JOINT REPLACEMENT  2022   left TKA   LEFT HEART CATH AND CORONARY ANGIOGRAPHY N/A 08/18/2021   Procedure: LEFT HEART CATH AND CORONARY ANGIOGRAPHY;  Surgeon: Nigel Mormon, MD;  Location: [facility];  Service: Cardiovascular;  Laterality: N/A;   TOTAL KNEE ARTHROPLASTY Left 10/27/2020   Procedure: LEFT TOTAL KNEE ARTHROPLASTY;  Surgeon: [name], MD;  Location: [facility];  Service: Orthopedics;  Laterality: Left;   TUBAL LIGATION     UPPER GASTROINTESTINAL ENDOSCOPY     Patient Active Problem List   Diagnosis Date Noted   Coronary artery disease of native artery of native heart with stable angina pectoris ([place])    Other constipation 05/25/2021   Class 2 severe obesity with serious comorbidity and body mass  index (BMI) of 35.0 to 35.9 in adult ([place]) 12/01/2020   Status post total left knee replacement 10/27/2020   Primary osteoarthritis of left knee    NAFLD (nonalcoholic fatty liver disease) 08/06/2020   Other fatigue 07/23/2020   SOBOE (shortness of breath on exertion) 07/23/2020   Diabetes mellitus ([place]) 07/23/2020   Hypertension associated with type 2 diabetes mellitus ([place]) 07/23/2020   Hyperlipidemia associated with type 2 diabetes mellitus ([place]) 07/23/2020   Absolute anemia 07/23/2020   Vitamin D deficiency 07/23/2020   At risk for heart disease 07/23/2020   AKI (acute kidney injury) ([place]) 09/07/2019   Acute kidney injury ([place]) 09/06/2019   Bradycardia 09/06/2019   Hyperglycemia 09/06/2019   HLD (hyperlipidemia)    Hypertensive nephropathy 05/16/2019   Erosive gastritis 11/16/2017   Fatty liver 11/16/2017   Abdominal pain 12/11/2012   Early satiety 12/11/2012   Hiatal hernia    Hx of cholecystectomy 02/05/2011   Flu-like symptoms 02/05/2011   CONSTIPATION 08/27/2008   FATTY LIVER DISEASE 05/07/2008   Nausea with vomiting 04/23/2008   EPIGASTRIC PAIN 04/23/2008   RENAL CALCULUS 04/22/2008   Type 2 diabetes mellitus with stage 2 chronic kidney disease, with long-term current use of insulin (  [place]) 05/19/2006   Type 2 diabetes mellitus with hyperlipidemia ([place]) 05/19/2006   HYPERTENSION, BENIGN SYSTEMIC 05/19/2006   GASTROESOPHAGEAL REFLUX, NO ESOPHAGITIS 05/19/2006   AMENORRHEA 05/19/2006    PCP: [name], MD  REFERRING PROVIDER: [name], MD  REFERRING DIAG: [phone] (ICD-10-CM) - Status post total left knee replacement  THERAPY DIAG:  Chronic pain of left knee  Muscle weakness (generalized)  Difficulty in walking, not elsewhere classified  Localized edema  ONSET DATE: 10/27/2020 - TKA, acute on chronic a few weeks  SUBJECTIVE:   SUBJECTIVE STATEMENT: Pt indicated pain at worst 2/10 in last week, occasional. Pt indicated walking over 1 hr can cause  some trouble (work activity)  PERTINENT HISTORY: DM, HTN  PAIN:  NPRS scale: 2/10 Pain location: Lt knee Pain description: chronic  Aggravating factors: walking Relieving factors: medicine with very painful  PRECAUTIONS: None  WEIGHT BEARING RESTRICTIONS No  FALLS:  Has patient fallen in last 6 months? 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      Additional Comments 3 steps to enter-has handrail on Rt side     OCCUPATION: Retired  PLOF:  Level of [place] Retired     Leisure works out at gym     PATIENT GOALS Reduce pain, walking    OBJECTIVE:   [place]:  02/02/2022:  FOTO update 59  12/31/2021: FOTO intake: 51  predicted:  71  COGNITION: 12/31/2021 Overall cognitive status: Within functional limits for tasks assessed     PALPATION:  12/31/2021 : Mild tenderness to touch anterior knee joint, inferior patella Lt  LE ROM:  Active ROM Right 12/31/2021 Left 12/31/2021  Knee flexion  110 AROM supine heel slide c pain anterior knee  Knee extension  -3 in seated LAQ AROM  0 PROM in supine heel prop   (Blank rows = not tested)  LE MMT:  MMT Right 12/31/2021 Left 12/31/2021 Left 01/18/2022 Left 02/02/2022  Hip flexion 5/5 5/5    Hip extension      Hip abduction      Hip adduction      Hip internal rotation      Hip external rotation      Knee flexion 5/5 4/5    Knee extension 5/5 32.9, 31.9 lbs 4/5 23.5, 19.5 lbs c pain 4/5 24.7, 22.1 lbs 5/5 30.1, 29.3 5/5  Ankle dorsiflexion 5/5 5/5    Ankle plantarflexion      Ankle inversion      Ankle eversion       (Blank rows = not tested)   FUNCTIONAL TESTS:  12/31/2021 TUG: independent 10.64 seconds Lt SLS:  3 seconds   Rt SLS: 5 seconds 18 inch transfer s UE assist: on first try with increased difficulty/pain in mid range Lt knee  GAIT: 12/31/2021 Distance walked: Household distances within clinic  Assistive device utilized:  Independent  Comments: Mild maintained knee flexion in stance Lt leg, mild reduction of stance on Lt leg.     TODAY'S TREATMENT: 02/02/22 Therex: Nustep Lvl 6 UE/LE 8 mins Leg press 75 lbs 2x10 single leg bil; 125# 2 x 15 bil push Step ups onto 8" step 2x10 bil Lateral step down control heel tap WB on Lt leg 2 x 10 c single hand assist  Neuro Re-ed SLS on black mat with contralateral leg corner touching light x 10 each, performed bilaterally Lateral stepping 3 cones x 6 bilateral  01/25/22 Therex: Nustep Lvl 6 UE/LE 8  mins Knee extension machine 10# 3x10 Knee flexion machine 20# 3x10 Leg press 75 lbs 2x10 single leg bil; 125# 3x10 bil push Step ups onto 8" step 2x10 bil Heel taps off 4" step 2x10 bil; light UE support and mod cues needed Seated SLR Lt 3# 2x10 Sit to/from stand without UE support 2x10   01/18/2022 Therex: Recumbent bike lvl 2 8 mins RPM around 50 Leg press 75 lbs x 15, single leg 2 x 15 50 lbs, performed bilaterally Incline calf stretch 30 sec x 3 bilateral Step up 6 inch fwd x 15 , performed bilaterally Seated SLR Lt x 10 c 2-3 sec hold in mid range Knee extension machine - double leg up, Lt leg lowering slowly 2 x 10 10 lbs   PATIENT EDUCATION:  12/31/2021 Education details: HEP, POC Person educated: Patient Education method: Consulting civil engineer, Media planner, Verbal cues, and Handouts Education comprehension: verbalized understanding, returned demonstration, and verbal cues required   HOME EXERCISE PROGRAM: Access Code: [email or web address] URL: [email or web address] Date: 12/31/2021 Prepared by: Scot Jun  Exercises - Supine Quadricep Sets  - 3-5 x daily - 7 x weekly - 1 sets - 10 reps - 5 hold - Supine Heel Slide (Mirrored)  - 3-5 x daily - 7 x weekly - 1 sets - 10 reps - 2 hold - Seated Quad Set (Mirrored)  - 3-5 x daily - 7 x weekly - 1 sets - 10 reps - 5 hold - Seated Long Arc Quad (Mirrored)  - 3-5 x daily - 7 x weekly - 1 sets - 5-10  reps - 2 hold - Seated Straight Leg Heel Taps  - 1-2 x daily - 7 x weekly - 1-2 sets - 5-10 reps - Sit to Stand  - 3 x daily - 7 x weekly - 1 sets - 10 reps - Seated Hamstring Stretch  - 1-2 x daily - 7 x weekly - 1 sets - 3-5 reps - 15 hold  ASSESSMENT:  CLINICAL IMPRESSION: Dynamometry check showed improvement in strength and closing in on what Rt leg tested at on evaluation.  Continued strengthening bilaterally as well as dynamic balance control to help improve functional movement patterns.   OBJECTIVE IMPAIRMENTS Abnormal gait, decreased activity tolerance, decreased balance, decreased coordination, decreased endurance, decreased mobility, difficulty walking, decreased ROM, decreased strength, hypomobility, increased edema, impaired perceived functional ability, impaired flexibility, improper body mechanics, and pain.   ACTIVITY LIMITATIONS cleaning, community activity, meal prep, laundry, and shopping.   PERSONAL FACTORS  HTN, hyperlipidemia, GERD, DM, RA, length of time since onset  are also affecting patient's functional outcome.    REHAB POTENTIAL: Fair to good  CLINICAL DECISION MAKING: Stable/uncomplicated  EVALUATION COMPLEXITY: Low   GOALS: Goals reviewed with patient? Yes Eval: 07/07/2021 Short term PT Goals (target date for Short term goals are 3 weeks 01/21/2022) Patient will demonstrate independent use of home exercise program to maintain progress from in clinic treatments. Goal status: Met - 01/18/2022   Long term PT goals (target dates for all long term goals are 10 weeks  03/11/2022 )  1. Patient will demonstrate/report pain at worst less than or equal to 2/10 to facilitate minimal limitation in daily activity secondary to pain symptoms. Goal status: on going 01/18/2022  2. Patient will demonstrate independent use of home exercise program to facilitate ability to maintain/progress functional gains from skilled physical therapy services. Goal status: on going  01/18/2022  3. Patient will demonstrate FOTO outcome > or = 71 % to indicate reduced disability due  to condition. Goal status: on going 01/18/2022  4.  Patient will demonstrate Lt knee AROM 0-115 degrees s symptoms to facilitate ability to perform transfers, sitting, ambulation, stair navigation s restriction due to mobility. Goal status: on going 01/18/2022  5.  Patient will demonstrate bilateral LE MMT 5/5, dynamometry improvement of > = 10 lbs to facilitate ability to perform usual standing, walking, stairs at PLOF s limitation due to symptoms.   Goal status: Met 02/02/2022  6.  Patient will demonstrate/report ability to ascend/descend stairs c reciprocal gait pattern for household entry.   Goal status: on going 01/18/2022  7.  Patient will demonstrate/report ability to return to gym workout routine.  a.  Goal Status: on going 01/18/2022   PLAN: PT FREQUENCY: 1-2x/week  PT DURATION: 10 weeks  PLANNED INTERVENTIONS:  Therapeutic exercises, Therapeutic activity, Neuro Muscular re-education, Balance training, Gait training, Patient/Family education, Joint mobilization, Stair training, DME instructions, Dry Needling, Electrical stimulation, Cryotherapy, Moist heat, Taping, Ultrasound, Ionotophoresis 98m/ml Dexamethasone, and Manual therapy.  All included unless contraindicated  PLAN FOR NEXT SESSION:  Progressive strengthening and dynamic /compliant surface balance.    MScot Jun PT, DPT, OCS, ATC 02/02/22  3:55 PM

## 2022-02-04 ENCOUNTER — Ambulatory Visit
Admission: RE | Admit: 2022-02-04 | Discharge: 2022-02-04 | Disposition: A | Discharge status: Home / Self Care | Payer: Medicare Other | Source: Ambulatory Visit | Attending: General Surgery | Admitting: General Surgery

## 2022-02-04 DIAGNOSIS — D242 Benign neoplasm of left breast: Secondary | ICD-10-CM

## 2022-02-04 DIAGNOSIS — R928 Other abnormal and inconclusive findings on diagnostic imaging of breast: Secondary | ICD-10-CM | POA: Diagnosis not present

## 2022-02-04 HISTORY — PX: BREAST BIOPSY: SHX20

## 2022-02-04 NOTE — Progress Notes (Signed)
All notes including notes from [facility] reviewed with Dr Ambrose Pancoast. OK to proceed as planned

## 2022-02-05 ENCOUNTER — Other Ambulatory Visit: Payer: Self-pay

## 2022-02-05 ENCOUNTER — Ambulatory Visit
Admission: RE | Admit: 2022-02-05 | Discharge: 2022-02-05 | Disposition: A | Discharge status: Home / Self Care | Payer: Medicare Other | Source: Ambulatory Visit | Attending: General Surgery | Admitting: General Surgery

## 2022-02-05 ENCOUNTER — Ambulatory Visit (HOSPITAL_BASED_OUTPATIENT_CLINIC_OR_DEPARTMENT_OTHER): Payer: Medicare Other | Admitting: Certified Registered"

## 2022-02-05 ENCOUNTER — Encounter (HOSPITAL_BASED_OUTPATIENT_CLINIC_OR_DEPARTMENT_OTHER)
Admission: RE | Disposition: A | Discharge status: Home / Self Care | Payer: Self-pay | Source: Ambulatory Visit | Attending: General Surgery

## 2022-02-05 ENCOUNTER — Encounter (HOSPITAL_BASED_OUTPATIENT_CLINIC_OR_DEPARTMENT_OTHER): Payer: Self-pay | Admitting: General Surgery

## 2022-02-05 ENCOUNTER — Ambulatory Visit (HOSPITAL_BASED_OUTPATIENT_CLINIC_OR_DEPARTMENT_OTHER)
Admission: RE | Admit: 2022-02-05 | Discharge: 2022-02-05 | Disposition: A | Discharge status: Home / Self Care | Payer: Medicare Other | Source: Ambulatory Visit | Attending: General Surgery | Admitting: General Surgery

## 2022-02-05 DIAGNOSIS — Z79899 Other long term (current) drug therapy: Secondary | ICD-10-CM | POA: Insufficient documentation

## 2022-02-05 DIAGNOSIS — Z87891 Personal history of nicotine dependence: Secondary | ICD-10-CM | POA: Insufficient documentation

## 2022-02-05 DIAGNOSIS — I10 Essential (primary) hypertension: Secondary | ICD-10-CM

## 2022-02-05 DIAGNOSIS — E1159 Type 2 diabetes mellitus with other circulatory complications: Secondary | ICD-10-CM

## 2022-02-05 DIAGNOSIS — K219 Gastro-esophageal reflux disease without esophagitis: Secondary | ICD-10-CM | POA: Diagnosis not present

## 2022-02-05 DIAGNOSIS — E119 Type 2 diabetes mellitus without complications: Secondary | ICD-10-CM | POA: Diagnosis not present

## 2022-02-05 DIAGNOSIS — D242 Benign neoplasm of left breast: Secondary | ICD-10-CM

## 2022-02-05 DIAGNOSIS — I2510 Atherosclerotic heart disease of native coronary artery without angina pectoris: Secondary | ICD-10-CM

## 2022-02-05 DIAGNOSIS — E785 Hyperlipidemia, unspecified: Secondary | ICD-10-CM | POA: Insufficient documentation

## 2022-02-05 DIAGNOSIS — N6012 Diffuse cystic mastopathy of left breast: Secondary | ICD-10-CM | POA: Diagnosis not present

## 2022-02-05 DIAGNOSIS — Z7984 Long term (current) use of oral hypoglycemic drugs: Secondary | ICD-10-CM | POA: Diagnosis not present

## 2022-02-05 DIAGNOSIS — M069 Rheumatoid arthritis, unspecified: Secondary | ICD-10-CM | POA: Insufficient documentation

## 2022-02-05 DIAGNOSIS — N6032 Fibrosclerosis of left breast: Secondary | ICD-10-CM | POA: Diagnosis not present

## 2022-02-05 DIAGNOSIS — N62 Hypertrophy of breast: Secondary | ICD-10-CM | POA: Diagnosis not present

## 2022-02-05 DIAGNOSIS — R921 Mammographic calcification found on diagnostic imaging of breast: Secondary | ICD-10-CM | POA: Diagnosis not present

## 2022-02-05 DIAGNOSIS — I071 Rheumatic tricuspid insufficiency: Secondary | ICD-10-CM | POA: Diagnosis not present

## 2022-02-05 DIAGNOSIS — R928 Other abnormal and inconclusive findings on diagnostic imaging of breast: Secondary | ICD-10-CM | POA: Diagnosis not present

## 2022-02-05 HISTORY — PX: BREAST LUMPECTOMY WITH RADIOACTIVE SEED LOCALIZATION: SHX6424

## 2022-02-05 HISTORY — DX: Chronic kidney disease, unspecified: N18.9

## 2022-02-05 LAB — GLUCOSE, CAPILLARY
Glucose-Capillary: 93 mg/dL (ref 70–99)
Glucose-Capillary: 95 mg/dL (ref 70–99)

## 2022-02-05 SURGERY — BREAST LUMPECTOMY WITH RADIOACTIVE SEED LOCALIZATION
Anesthesia: General | Site: Breast | Laterality: Left

## 2022-02-05 MED ORDER — CHLORHEXIDINE GLUCONATE CLOTH 2 % EX PADS: 6.0000 | MEDICATED_PAD | Freq: Once | CUTANEOUS | Status: DC

## 2022-02-05 MED ORDER — CELECOXIB 200 MG PO CAPS
ORAL_CAPSULE | ORAL | Status: AC
  Filled 2022-02-05: qty 1

## 2022-02-05 MED ORDER — SCOPOLAMINE 1 MG/3DAYS TD PT72
MEDICATED_PATCH | TRANSDERMAL | Status: AC
  Filled 2022-02-05: qty 1

## 2022-02-05 MED ORDER — GABAPENTIN 300 MG PO CAPS
ORAL_CAPSULE | ORAL | Status: AC
  Filled 2022-02-05: qty 1

## 2022-02-05 MED ORDER — PROMETHAZINE HCL 25 MG/ML IJ SOLN: 6.2500 mg | INTRAMUSCULAR | Status: DC | PRN

## 2022-02-05 MED ORDER — ONDANSETRON HCL 4 MG/2ML IJ SOLN
INTRAMUSCULAR | Status: DC | PRN
  Administered 2022-02-05: 4 mg via INTRAVENOUS

## 2022-02-05 MED ORDER — SCOPOLAMINE 1 MG/3DAYS TD PT72
1.0000 | MEDICATED_PATCH | TRANSDERMAL | Status: DC
  Administered 2022-02-05: 1.5 mg via TRANSDERMAL

## 2022-02-05 MED ORDER — ACETAMINOPHEN 500 MG PO TABS
ORAL_TABLET | ORAL | Status: AC
  Filled 2022-02-05: qty 2

## 2022-02-05 MED ORDER — MIDAZOLAM HCL 5 MG/5ML IJ SOLN
INTRAMUSCULAR | Status: DC | PRN
  Administered 2022-02-05: 1 mg via INTRAVENOUS

## 2022-02-05 MED ORDER — FENTANYL CITRATE (PF) 100 MCG/2ML IJ SOLN
INTRAMUSCULAR | Status: DC | PRN
  Administered 2022-02-05: 50 ug via INTRAVENOUS

## 2022-02-05 MED ORDER — ACETAMINOPHEN 500 MG PO TABS: 1000.0000 mg | ORAL_TABLET | ORAL | Status: AC

## 2022-02-05 MED ORDER — CELECOXIB 200 MG PO CAPS
200.0000 mg | ORAL_CAPSULE | ORAL | Status: AC
  Administered 2022-02-05: 200 mg via ORAL

## 2022-02-05 MED ORDER — BUPIVACAINE-EPINEPHRINE (PF) 0.25% -1:200000 IJ SOLN
INTRAMUSCULAR | Status: DC | PRN
  Administered 2022-02-05: 20 mL

## 2022-02-05 MED ORDER — EPHEDRINE SULFATE (PRESSORS) 50 MG/ML IJ SOLN
INTRAMUSCULAR | Status: DC | PRN
  Administered 2022-02-05 (×2): 10 mg via INTRAVENOUS

## 2022-02-05 MED ORDER — CEFAZOLIN SODIUM-DEXTROSE 2-4 GM/100ML-% IV SOLN
INTRAVENOUS | Status: AC
  Filled 2022-02-05: qty 100

## 2022-02-05 MED ORDER — FENTANYL CITRATE (PF) 100 MCG/2ML IJ SOLN: 25.0000 ug | INTRAMUSCULAR | Status: DC | PRN

## 2022-02-05 MED ORDER — ACETAMINOPHEN 500 MG PO TABS
1000.0000 mg | ORAL_TABLET | Freq: Once | ORAL | Status: AC
  Administered 2022-02-05: 1000 mg via ORAL

## 2022-02-05 MED ORDER — EPHEDRINE 5 MG/ML INJ
INTRAVENOUS | Status: AC
  Filled 2022-02-05: qty 5

## 2022-02-05 MED ORDER — LIDOCAINE HCL (CARDIAC) PF 100 MG/5ML IV SOSY
PREFILLED_SYRINGE | INTRAVENOUS | Status: DC | PRN
  Administered 2022-02-05: 100 mg via INTRAVENOUS

## 2022-02-05 MED ORDER — PROPOFOL 10 MG/ML IV BOLUS
INTRAVENOUS | Status: DC | PRN
  Administered 2022-02-05: 100 mg via INTRAVENOUS

## 2022-02-05 MED ORDER — GABAPENTIN 300 MG PO CAPS
300.0000 mg | ORAL_CAPSULE | ORAL | Status: AC
  Administered 2022-02-05: 300 mg via ORAL

## 2022-02-05 MED ORDER — DEXAMETHASONE SODIUM PHOSPHATE 4 MG/ML IJ SOLN
INTRAMUSCULAR | Status: DC | PRN
  Administered 2022-02-05: 4 mg via INTRAVENOUS

## 2022-02-05 MED ORDER — LIDOCAINE 2% (20 MG/ML) 5 ML SYRINGE
INTRAMUSCULAR | Status: AC
  Filled 2022-02-05: qty 5

## 2022-02-05 MED ORDER — AMISULPRIDE (ANTIEMETIC) 5 MG/2ML IV SOLN: 10.0000 mg | Freq: Once | INTRAVENOUS | Status: DC | PRN

## 2022-02-05 MED ORDER — LACTATED RINGERS IV SOLN: INTRAVENOUS | Status: DC

## 2022-02-05 MED ORDER — CELECOXIB 200 MG PO CAPS: 200.0000 mg | ORAL_CAPSULE | Freq: Once | ORAL | Status: AC

## 2022-02-05 MED ORDER — FENTANYL CITRATE (PF) 100 MCG/2ML IJ SOLN
INTRAMUSCULAR | Status: AC
  Filled 2022-02-05: qty 2

## 2022-02-05 MED ORDER — CEFAZOLIN SODIUM-DEXTROSE 2-4 GM/100ML-% IV SOLN
2.0000 g | INTRAVENOUS | Status: AC
  Administered 2022-02-05: 2 g via INTRAVENOUS

## 2022-02-05 MED ORDER — OXYCODONE HCL 5 MG PO TABS: 5.0000 mg | ORAL_TABLET | Freq: Four times a day (QID) | ORAL | 0 refills | Status: DC | PRN

## 2022-02-05 MED ORDER — MIDAZOLAM HCL 2 MG/2ML IJ SOLN
INTRAMUSCULAR | Status: AC
  Filled 2022-02-05: qty 2

## 2022-02-05 SURGICAL SUPPLY — 40 items
ADH SKN CLS APL DERMABOND .7 (GAUZE/BANDAGES/DRESSINGS) ×1
APL PRP STRL LF DISP 70% ISPRP (MISCELLANEOUS) ×1
APPLIER CLIP 9.375 MED OPEN (MISCELLANEOUS)
APR CLP MED 9.3 20 MLT OPN (MISCELLANEOUS)
BLADE SURG 15 STRL LF DISP TIS (BLADE) ×1 IMPLANT
BLADE SURG 15 STRL SS (BLADE) ×1
CANISTER SUC SOCK COL 7IN (MISCELLANEOUS) ×1 IMPLANT
CANISTER SUCT 1200ML W/VALVE (MISCELLANEOUS) ×1 IMPLANT
CHLORAPREP W/TINT 26 (MISCELLANEOUS) ×1 IMPLANT
CLIP APPLIE 9.375 MED OPEN (MISCELLANEOUS) IMPLANT
COVER BACK TABLE 60X90IN (DRAPES) ×1 IMPLANT
COVER MAYO STAND STRL (DRAPES) ×1 IMPLANT
COVER PROBE CYLINDRICAL 5X96 (MISCELLANEOUS) ×1 IMPLANT
DERMABOND ADVANCED .7 DNX12 (GAUZE/BANDAGES/DRESSINGS) ×1 IMPLANT
DRAPE LAPAROSCOPIC ABDOMINAL (DRAPES) ×1 IMPLANT
DRAPE UTILITY XL STRL (DRAPES) ×1 IMPLANT
ELECT COATED BLADE 2.86 ST (ELECTRODE) ×1 IMPLANT
ELECT REM PT RETURN 9FT ADLT (ELECTROSURGICAL) ×1
ELECTRODE REM PT RTRN 9FT ADLT (ELECTROSURGICAL) ×1 IMPLANT
GLOVE BIO SURGEON STRL SZ 6.5 (GLOVE) IMPLANT
GLOVE BIO SURGEON STRL SZ7.5 (GLOVE) ×2 IMPLANT
GLOVE BIOGEL PI IND STRL 7.0 (GLOVE) IMPLANT
GOWN STRL REUS W/ TWL LRG LVL3 (GOWN DISPOSABLE) ×2 IMPLANT
GOWN STRL REUS W/TWL LRG LVL3 (GOWN DISPOSABLE) ×2
KIT MARKER MARGIN INK (KITS) ×1 IMPLANT
NDL HYPO 25X1 1.5 SAFETY (NEEDLE) IMPLANT
NEEDLE HYPO 25X1 1.5 SAFETY (NEEDLE) ×1 IMPLANT
NS IRRIG 1000ML POUR BTL (IV SOLUTION) IMPLANT
PACK BASIN DAY SURGERY FS (CUSTOM PROCEDURE TRAY) ×1 IMPLANT
PENCIL SMOKE EVACUATOR (MISCELLANEOUS) ×1 IMPLANT
SLEEVE SCD COMPRESS KNEE MED (STOCKING) ×1 IMPLANT
SPONGE T-LAP 18X18 ~~LOC~~+RFID (SPONGE) ×1 IMPLANT
SUT MON AB 4-0 PC3 18 (SUTURE) ×1 IMPLANT
SUT SILK 2 0 SH (SUTURE) IMPLANT
SUT VICRYL 3-0 CR8 SH (SUTURE) ×1 IMPLANT
SYR CONTROL 10ML LL (SYRINGE) IMPLANT
TOWEL GREEN STERILE FF (TOWEL DISPOSABLE) ×1 IMPLANT
TRAY FAXITRON CT DISP (TRAY / TRAY PROCEDURE) ×1 IMPLANT
TUBE CONNECTING 20X1/4 (TUBING) ×1 IMPLANT
YANKAUER SUCT BULB TIP NO VENT (SUCTIONS) IMPLANT

## 2022-02-05 NOTE — Interval H&P Note (Signed)
History and Physical Interval Note:  02/05/2022 11:28 AM  [name]  has presented today for surgery, with the diagnosis of LEFT BREAST PAPILLOMAS.  The various methods of treatment have been discussed with the patient and family. After consideration of risks, benefits and other options for treatment, the patient has consented to  Procedure(s): LEFT BREAST BRACKETED LUMPECTOMY WITH RADIOACTIVE SEED LOCALIZATION (Left) as a surgical intervention.  The patient's history has been reviewed, patient examined, no change in status, stable for surgery.  I have reviewed the patient's chart and labs.  Questions were answered to the patient's satisfaction.     Leslie Davis

## 2022-02-05 NOTE — Anesthesia Preprocedure Evaluation (Addendum)
Anesthesia Evaluation  Patient identified by MRN, date of birth, ID band Patient awake    Reviewed: Allergy & Precautions, NPO status , Patient's Chart, lab work & pertinent test results  History of Anesthesia Complications Negative for: history of anesthetic complications  Airway Mallampati: II  TM Distance: >3 FB Neck ROM: Full    Dental no notable dental hx. (+) Dental Advisory Given   Pulmonary former smoker   Pulmonary exam normal        Cardiovascular hypertension, Pt. on medications and Pt. on home beta blockers + CAD  Normal cardiovascular exam  Cardiac Cath 08/18/2021 LM: Normal LAD: Prox calcific 30% disease         Mid, focal, eccentric 70% stenosis Lcx: No significant disease RCA: Ostial 40% stenosis   CTA FFR noted to be 0.55 in distal LAD, with normal numbers in prox and mid LAD. This value is likely due to mid LAD stenosis. Patient reports bike exercise as recently as yesterday for 20 min without any chest pain or shortness of breath. She reports her symptoms have stopped after stopping a certain medication prescribed by her ?nephrologist. While I am not sure about this reasoning, it is possible that resolution of her chest pain symptoms may correlate with metoprolol started by Dr. Terri Skains. With specific questioning re: continued medical therapy or addition of revascularization, patient opted medical therapy. In absence of any angina or angina equivalent symptoms on excellent medical therapy, reasonable to continue the same at that time.  In future, if symptoms increase, could consider PCI to mid LAD.   Echo 07/12/2021 Echocardiogram 07/15/2021: Normal LV systolic function with EF 63%. Left ventricle cavity is normal in size. Mild concentric remodeling of the left ventricle. Normal global wall motion. Normal diastolic filling pattern. Calculated EF 63%. Left atrial cavity is mildly dilated by volume. Structurally normal  tricuspid valve.  Mild tricuspid regurgitation. No evidence of pulmonary hypertension.    Neuro/Psych negative neurological ROS     GI/Hepatic Neg liver ROS,GERD  ,,  Endo/Other  diabetes    Renal/GU negative Renal ROS     Musculoskeletal  (+) Arthritis , Rheumatoid disorders,    Abdominal   Peds  Hematology negative hematology ROS (+)   Anesthesia Other Findings   Reproductive/Obstetrics                             Anesthesia Physical Anesthesia Plan  ASA: 3  Anesthesia Plan: General   Post-op Pain Management: Celebrex PO (pre-op)* and Tylenol PO (pre-op)*   Induction: Intravenous  PONV Risk Score and Plan: 4 or greater and Ondansetron, Dexamethasone, Midazolam and Scopolamine patch - Pre-op  Airway Management Planned: LMA  Additional Equipment:   Intra-op Plan:   Post-operative Plan: Extubation in OR  Informed Consent: I have reviewed the patients History and Physical, chart, labs and discussed the procedure including the risks, benefits and alternatives for the proposed anesthesia with the patient or authorized representative who has indicated his/her understanding and acceptance.     Dental advisory given  Plan Discussed with: Anesthesiologist and CRNA  Anesthesia Plan Comments:        Anesthesia Quick Evaluation

## 2022-02-05 NOTE — H&P (Signed)
REFERRING PHYSICIAN: Leilani Able*  PROVIDER: Landry Corporal, MD  MRN: [id number] DOB: 06/19/1955 Subjective   Chief Complaint: New Consultation (Left Breast )   History of Present Illness: Leslie Davis is a 66 y.o. female who is seen today as an office consultation for evaluation of New Consultation (Left Breast ) .   We are asked to see the patient in consultation by Dr. Bryon Lions to evaluate her for papillomas in the left breast. The patient is a 66 year old black female who recently went for a routine screening mammogram. At that time she was found to have an 8 cm area of calcification in the inner aspect of the left breast. The front and back of the calcifications in the distribution was biopsied and came back as intraductal papillomas. Because of the size of the area involved the radiologist were worried that there may be something more significant there and recommended excision of the calcs. She does have some cardiac history and is followed by Dr. Terri Skains. She has no family history of breast cancer and does not smoke.  Review of Systems: A complete review of systems was obtained from the patient. I have reviewed this information and discussed as appropriate with the patient. See HPI as well for other ROS.  ROS   Medical History: Past Medical History:  Diagnosis Date  Anemia  Arthritis  Diabetes mellitus without complication (CMS-HCC)  GERD (gastroesophageal reflux disease)  Hyperlipidemia  Hypertension   Patient Active Problem List  Diagnosis  Intraductal papilloma of breast, left   History reviewed. No pertinent surgical history.   No Known Allergies  Current Outpatient Medications on File Prior to Visit  Medication Sig Dispense Refill  amLODIPine (NORVASC) 10 MG tablet Take 1 tablet by mouth once daily  aspirin 81 MG EC tablet Take by mouth  carvediloL (COREG) 3.125 MG tablet Take by mouth  celecoxib (CELEBREX) 200 MG capsule Take by mouth   cetirizine (ZYRTEC) 10 MG tablet Take 1 tablet by mouth once daily  dapagliflozin propanediol (FARXIGA) 10 mg tablet Take by mouth  ferrous sulfate 325 (65 FE) MG EC tablet Take 325 mg by mouth daily with breakfast  linaCLOtide ([name]) 145 mcg capsule Take by mouth  nitroGLYcerin (NITROSTAT) 0.4 MG SL tablet Place under the tongue  OZEMPIC 1 mg/dose (4 mg/3 mL) pen injector Inject 1 mg into the skin once a week.  polyethylene glycol (MIRALAX) packet Take by mouth  rosuvastatin (CRESTOR) 20 MG tablet Take 1 tablet by mouth once daily  sennosides 15 mg Chew Take by mouth  temazepam (RESTORIL) 30 mg capsule TAKE ONE CAPSULE BY MOUTH EVERYDAY AT BEDTIME AS NEEDED   No current facility-administered medications on file prior to visit.   History reviewed. No pertinent family history.   Social History   Tobacco Use  Smoking Status Never  Smokeless Tobacco Never    Social History   Socioeconomic History  Marital status: Married  Tobacco Use  Smoking status: Never  Smokeless tobacco: Never  Substance and Sexual Activity  Alcohol use: Not Currently  Drug use: Never   Objective:   Vitals:  BP: (!) 152/70  Pulse: 56  Temp: 36.4 C (97.6 F)  SpO2: 99%  Weight: 76.1 kg (167 lb 12.8 oz)  Height: 162.6 cm (5' 4" )   Body mass index is 28.8 kg/m.  Physical Exam Vitals reviewed.  Constitutional:  General: She is not in acute distress. Appearance: Normal appearance.  HENT:  Head: Normocephalic and atraumatic.  Right Ear: External  ear normal.  Left Ear: External ear normal.  Nose: Nose normal.  Mouth/Throat:  Mouth: Mucous membranes are moist.  Pharynx: Oropharynx is clear.  Eyes:  General: No scleral icterus. Extraocular Movements: Extraocular movements intact.  Conjunctiva/sclera: Conjunctivae normal.  Pupils: Pupils are equal, round, and reactive to light.  Cardiovascular:  Rate and Rhythm: Normal rate and regular rhythm.  Pulses: Normal pulses.  Heart sounds:  Normal heart sounds.  Pulmonary:  Effort: Pulmonary effort is normal. No respiratory distress.  Breath sounds: Normal breath sounds.  Abdominal:  General: Bowel sounds are normal.  Palpations: Abdomen is soft.  Tenderness: There is no abdominal tenderness.  Musculoskeletal:  General: No swelling, tenderness or deformity. Normal range of motion.  Cervical back: Normal range of motion and neck supple.  Skin: General: Skin is warm and dry.  Coloration: Skin is not jaundiced.  Neurological:  General: No focal deficit present.  Mental Status: She is alert and oriented to person, place, and time.  Psychiatric:  Mood and Affect: Mood normal.  Behavior: Behavior normal.    Breast: There is no palpable mass in either breast. There is no palpable axillary, supraclavicular, or cervical lymphadenopathy. The 2 biopsy sites on the inner left breast did ulcerate the skin  Labs, Imaging and Diagnostic Testing:  Assessment and Plan:   Diagnoses and all orders for this visit:  Intraductal papilloma of breast, left    The patient appears to have intraductal papillomas in the inner aspect of the left breast associated with 8 cm of calcification. Because of the size of the area involved there is at least a 10% chance of missing something more significant. For this reason the recommendation is to have the area of calcification removed. This Would Need to Be Done by a Bracketed Radioactive Seed Localized Lumpectomy. I have discussed with her in detail the risks and benefits of the operation as well as some of the technical aspects including use of radioactive seeds to localize the area and she understands and wishes to proceed. We will need cardiac clearance prior to scheduling surgery.

## 2022-02-05 NOTE — Anesthesia Procedure Notes (Signed)
Procedure Name: LMA Insertion Date/Time: 02/05/2022 12:11 PM  Performed by: Tawni Millers, CRNAPre-anesthesia Checklist: Patient identified, Emergency Drugs available, Suction available and Patient being monitored Patient Re-evaluated:Patient Re-evaluated prior to induction Oxygen Delivery Method: Circle system utilized Preoxygenation: Pre-oxygenation with 100% oxygen Induction Type: IV induction Ventilation: Mask ventilation without difficulty LMA: LMA inserted LMA Size: 4.0 Number of attempts: 1 Airway Equipment and Method: Bite block Placement Confirmation: positive ETCO2 Tube secured with: Tape Dental Injury: Teeth and Oropharynx as per pre-operative assessment

## 2022-02-05 NOTE — Transfer of Care (Signed)
Immediate Anesthesia Transfer of Care Note  Patient: Leslie Davis  Procedure(s) Performed: LEFT BREAST BRACKETED LUMPECTOMY WITH RADIOACTIVE SEED LOCALIZATION (Left: Breast)  Patient Location: PACU  Anesthesia Type:General  Level of Consciousness: sedated  Airway & Oxygen Therapy: Patient Spontanous Breathing and Patient connected to face mask oxygen  Post-op Assessment: Report given to RN and Post -op Vital signs reviewed and stable  Post vital signs: Reviewed and stable  Last Vitals:  Vitals Value Taken Time  BP 139/55 02/05/22 1249  Temp    Pulse 52 02/05/22 1250  Resp 12 02/05/22 1250  SpO2 100 % 02/05/22 1250  Vitals shown include unvalidated device data.  Last Pain:  Vitals:   02/05/22 1134  TempSrc: Oral  PainSc: 0-No pain      Patients Stated Pain Goal: 3 (16/10/96 0454)  Complications: No notable events documented.

## 2022-02-05 NOTE — Op Note (Signed)
02/05/2022  12:44 PM  PATIENT:  Leslie Davis  66 y.o. female  PRE-OPERATIVE DIAGNOSIS:  LEFT BREAST PAPILLOMAS  POST-OPERATIVE DIAGNOSIS:  LEFT BREAST PAPILLOMAS  PROCEDURE:  Procedure(s): LEFT BREAST BRACKETED LUMPECTOMY WITH RADIOACTIVE SEED LOCALIZATION (Left)  SURGEON:  Surgeon(s) and Role:    * Jovita Kussmaul, MD - Primary  PHYSICIAN ASSISTANT:   ASSISTANTS: none   ANESTHESIA:   local and general  EBL:  10 mL   BLOOD ADMINISTERED:none  DRAINS: none   LOCAL MEDICATIONS USED:  MARCAINE     SPECIMEN:  Source of Specimen:  left breast tissue  DISPOSITION OF SPECIMEN:  PATHOLOGY  COUNTS:  YES  TOURNIQUET:  * No tourniquets in log *  DICTATION: .Dragon Dictation  After informed consent was obtained the patient was brought to the operating room and placed in the supine position on the operating table.  After adequate induction of general anesthesia the patient's left breast was prepped with ChloraPrep, allowed to dry, and draped in usual sterile manner.  An appropriate timeout was performed.  Previously 2 I-125 seeds were placed in the central portion of the left breast to bracket some calcifications that had papillomas associated with them.  The neoprobe was set to I-125 in the area of radioactivity was readily identified.  The area around this was infiltrated with quarter percent Marcaine.  Next a curvilinear incision was made along the upper edge of the areola of the left breast with a 15 blade knife.  The incision was carried through the skin and subcutaneous tissue sharply with the electrocautery.  Dissection was then carried towards the radioactive seeds under the direction of the neoprobe.  Once I more closely approached the radioactive seeds I then removed a oblong piece of breast tissue sharply with the electrocautery around the 2 radioactive seeds while checking the area of radioactivity frequently.  Once the specimen was removed it was oriented with the  appropriate paint colors.  A specimen radiograph was obtained that showed the 2 clips and 2 seeds to be within the specimen with some calcifications.  The specimen was then sent to pathology for further evaluation.  Hemostasis was achieved using the Bovie electrocautery.  The wound was irrigated with saline and infiltrated with more quarter percent Marcaine.  The deep layer of the incision was then closed with layers of interrupted 3-0 Vicryl stitches.  The skin was closed with interrupted 4-0 Monocryl subcuticular stitches.  Dermabond dressings were applied.  The patient tolerated the procedure well.  At the end of the case all needle sponge and instrument counts were correct.  The patient was awakened and then taken to recovery in stable condition.  PLAN OF CARE: Discharge to home after PACU  PATIENT DISPOSITION:  PACU - hemodynamically stable.   Delay start of Pharmacological VTE agent (>24hrs) due to surgical blood loss or risk of bleeding: not applicable

## 2022-02-05 NOTE — Anesthesia Postprocedure Evaluation (Signed)
Anesthesia Post Note  Patient: Leslie Davis  Procedure(s) Performed: LEFT BREAST BRACKETED LUMPECTOMY WITH RADIOACTIVE SEED LOCALIZATION (Left: Breast)     Patient location during evaluation: PACU Anesthesia Type: General Level of consciousness: sedated Pain management: pain level controlled Vital Signs Assessment: post-procedure vital signs reviewed and stable Respiratory status: spontaneous breathing and respiratory function stable Cardiovascular status: stable Postop Assessment: no apparent nausea or vomiting Anesthetic complications: no   No notable events documented.  Last Vitals:  Vitals:   02/05/22 1330 02/05/22 1345  BP: (!) 161/59 (!) 175/61  Pulse: (!) 52 (!) 55  Resp: 10 16  Temp: (!) 36.2 C (!) 36.2 C  SpO2: 98% 97%    Last Pain:  Vitals:   02/05/22 1345  TempSrc:   PainSc: 0-No pain                 Dequincy Born DANIEL

## 2022-02-05 NOTE — Discharge Instructions (Signed)
  Post Anesthesia Home Care Instructions  Activity: Get plenty of rest for the remainder of the day. A responsible individual must stay with you for 24 hours following the procedure.  For the next 24 hours, DO NOT: -Drive a car -Paediatric nurse -Drink alcoholic beverages -Take any medication unless instructed by your physician -Make any legal decisions or sign important papers.  Meals: Start with liquid foods such as gelatin or soup. Progress to regular foods as tolerated. Avoid greasy, spicy, heavy foods. If nausea and/or vomiting occur, drink only clear liquids until the nausea and/or vomiting subsides. Call your physician if vomiting continues.  Special Instructions/Symptoms: Your throat may feel dry or sore from the anesthesia or the breathing tube placed in your throat during surgery. If this causes discomfort, gargle with warm salt water. The discomfort should disappear within 24 hours.  If you had a scopolamine patch placed behind your ear for the management of post- operative nausea and/or vomiting:  1. The medication in the patch is effective for 72 hours, after which it should be removed.  Wrap patch in a tissue and discard in the trash. Wash hands thoroughly with soap and water. 2. You may remove the patch earlier than 72 hours if you experience unpleasant side effects which may include dry mouth, dizziness or visual disturbances. 3. Avoid touching the patch. Wash your hands with soap and water after contact with the patch.  No tylenol or ibuprofen until after 5:30 today if needed.

## 2022-02-08 ENCOUNTER — Ambulatory Visit (INDEPENDENT_AMBULATORY_CARE_PROVIDER_SITE_OTHER): Payer: Medicare Other | Admitting: Rehabilitative and Restorative Service Providers"

## 2022-02-08 ENCOUNTER — Telehealth: Payer: Self-pay

## 2022-02-08 ENCOUNTER — Encounter (HOSPITAL_BASED_OUTPATIENT_CLINIC_OR_DEPARTMENT_OTHER): Payer: Self-pay | Admitting: General Surgery

## 2022-02-08 DIAGNOSIS — R600 Localized edema: Secondary | ICD-10-CM | POA: Diagnosis not present

## 2022-02-08 DIAGNOSIS — R262 Difficulty in walking, not elsewhere classified: Secondary | ICD-10-CM

## 2022-02-08 DIAGNOSIS — M6281 Muscle weakness (generalized): Secondary | ICD-10-CM

## 2022-02-08 DIAGNOSIS — G8929 Other chronic pain: Secondary | ICD-10-CM | POA: Diagnosis not present

## 2022-02-08 DIAGNOSIS — M25562 Pain in left knee: Secondary | ICD-10-CM | POA: Diagnosis not present

## 2022-02-08 LAB — SURGICAL PATHOLOGY

## 2022-02-08 NOTE — Therapy (Signed)
OUTPATIENT PHYSICAL THERAPY TREATMENT   Patient Name: Leslie Davis MRN: [id number] DOB:1956/02/23, 66 y.o., female Today's Date: 02/08/2022  END OF SESSION:   PT End of Session - 02/08/22 0912     Visit Number 7    Number of Visits 20    Date for PT Re-Evaluation 03/11/22    Authorization Type UHC Medicare 20% co insurance    Progress Note Due on Visit 10    PT Start Time [phone]    PT Stop Time 1003    PT Time Calculation (min) 39 min    Activity Tolerance Patient tolerated treatment well    Behavior During Therapy WFL for tasks assessed/performed                    Past Medical History:  Diagnosis Date   CKD (chronic kidney disease)    stage 3   Coronary artery disease 07/2021   obstructive CAD-70% LAD-treated medically   DM (diabetes mellitus) ([place])    Fatty liver disease, nonalcoholic    Gastritis    Gastroparesis    GERD (gastroesophageal reflux disease)    Heartburn    Hiatal hernia    HLD (hyperlipidemia)    HTN (hypertension)    Joint pain    Renal calculus    Rheumatoid arthritis ([place])    Vitamin D deficiency    Past Surgical History:  Procedure Laterality Date   BREAST BIOPSY  02/04/2022   MM LT RADIOACTIVE SEED EA ADD LESION LOC MAMMO GUIDE 02/04/2022 [facility]   BREAST BIOPSY  02/04/2022   MM LT RADIOACTIVE SEED LOC MAMMO GUIDE 02/04/2022 [facility]   BREAST LUMPECTOMY WITH RADIOACTIVE SEED LOCALIZATION Left 02/05/2022   Procedure: LEFT BREAST BRACKETED LUMPECTOMY WITH RADIOACTIVE SEED LOCALIZATION;  Surgeon: Jovita Kussmaul, MD;  Location: [place];  Service: General;  Laterality: Left;   CHOLECYSTECTOMY     COLONOSCOPY  09/10/2011   normal   JOINT REPLACEMENT  2022   left TKA   LEFT HEART CATH AND CORONARY ANGIOGRAPHY N/A 08/18/2021   Procedure: LEFT HEART CATH AND CORONARY ANGIOGRAPHY;  Surgeon: Nigel Mormon, MD;  Location: [facility];  Service: Cardiovascular;  Laterality: N/A;    TOTAL KNEE ARTHROPLASTY Left 10/27/2020   Procedure: LEFT TOTAL KNEE ARTHROPLASTY;  Surgeon: [name], MD;  Location: [facility];  Service: Orthopedics;  Laterality: Left;   TUBAL LIGATION     UPPER GASTROINTESTINAL ENDOSCOPY     Patient Active Problem List   Diagnosis Date Noted   Coronary artery disease of native artery of native heart with stable angina pectoris ([place])    Other constipation 05/25/2021   Class 2 severe obesity with serious comorbidity and body mass index (BMI) of 35.0 to 35.9 in adult [facility]) 12/01/2020   Status post total left knee replacement 10/27/2020   Primary osteoarthritis of left knee    NAFLD (nonalcoholic fatty liver disease) 08/06/2020   Other fatigue 07/23/2020   SOBOE (shortness of breath on exertion) 07/23/2020   Diabetes mellitus ([place]) 07/23/2020   Hypertension associated with type 2 diabetes mellitus ([place]) 07/23/2020   Hyperlipidemia associated with type 2 diabetes mellitus ([place]) 07/23/2020   Absolute anemia 07/23/2020   Vitamin D deficiency 07/23/2020   At risk for heart disease 07/23/2020   AKI (acute kidney injury) ([place]) 09/07/2019   Acute kidney injury ([place]) 09/06/2019   Bradycardia 09/06/2019   Hyperglycemia 09/06/2019   HLD (hyperlipidemia)    Hypertensive nephropathy 05/16/2019  Erosive gastritis 11/16/2017   Fatty liver 11/16/2017   Abdominal pain 12/11/2012   Early satiety 12/11/2012   Hiatal hernia    Hx of cholecystectomy 02/05/2011   Flu-like symptoms 02/05/2011   CONSTIPATION 08/27/2008   FATTY LIVER DISEASE 05/07/2008   Nausea with vomiting 04/23/2008   EPIGASTRIC PAIN 04/23/2008   RENAL CALCULUS 04/22/2008   Type 2 diabetes mellitus with stage 2 chronic kidney disease, with long-term current use of insulin ([place]) 05/19/2006   Type 2 diabetes mellitus with hyperlipidemia ([place]) 05/19/2006   HYPERTENSION, BENIGN SYSTEMIC 05/19/2006   GASTROESOPHAGEAL REFLUX, NO ESOPHAGITIS 05/19/2006   AMENORRHEA 05/19/2006    PCP: [name], MD  REFERRING PROVIDER: [name], MD  REFERRING DIAG: [phone] (ICD-10-CM) - Status post total left knee replacement  THERAPY DIAG:  Chronic pain of left knee  Muscle weakness (generalized)  Difficulty in walking, not elsewhere classified  Localized edema  ONSET DATE: 10/27/2020 - TKA, acute on chronic a few weeks  SUBJECTIVE:   SUBJECTIVE STATEMENT: Pt indicated longer period of time on feet and stairs are still troublesome.   PERTINENT HISTORY: DM, HTN  PAIN:  NPRS scale: upon arrival 0/10 Pain location: Lt knee Pain description: chronic  Aggravating factors: walking Relieving factors: medicine with very painful  PRECAUTIONS: None  WEIGHT BEARING RESTRICTIONS No  FALLS:  Has patient fallen in last 6 months? 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      Additional Comments 3 steps to enter-has handrail on Rt side     OCCUPATION: Retired  PLOF:  Level of [place] Retired     Leisure works out at gym     PATIENT GOALS Reduce pain, walking    OBJECTIVE:   [place]:  02/02/2022:  FOTO update 59  12/31/2021: FOTO intake: 51  predicted:  71  COGNITION: 12/31/2021 Overall cognitive status: Within functional limits for tasks assessed     PALPATION:  12/31/2021 : Mild tenderness to touch anterior knee joint, inferior patella Lt  LE ROM:  Active ROM Right 12/31/2021 Left 12/31/2021  Knee flexion  110 AROM supine heel slide c pain anterior knee  Knee extension  -3 in seated LAQ AROM  0 PROM in supine heel prop   (Blank rows = not tested)  LE MMT:  MMT Right 12/31/2021 Left 12/31/2021 Left 01/18/2022 Left 02/02/2022  Hip flexion 5/5 5/5    Hip extension      Hip abduction      Hip adduction      Hip internal rotation      Hip external rotation      Knee flexion 5/5 4/5    Knee extension 5/5 32.9, 31.9 lbs 4/5 23.5, 19.5 lbs c pain 4/5 24.7, 22.1 lbs 5/5 30.1,  29.3 5/5  Ankle dorsiflexion 5/5 5/5    Ankle plantarflexion      Ankle inversion      Ankle eversion       (Blank rows = not tested)   FUNCTIONAL TESTS:  02/08/2022: Lt SLS: 7 seconds     Rt SLS:  6 seconds  12/31/2021 TUG: independent 10.64 seconds Lt SLS:  3 seconds   Rt SLS: 5 seconds 18 inch transfer s UE assist: on first try with increased difficulty/pain in mid range Lt knee  GAIT: 12/31/2021 Distance walked: Household distances within clinic  Assistive device utilized: Independent  Comments: Mild maintained knee flexion in stance Lt leg, mild reduction of stance on  Lt leg.     TODAY'S TREATMENT: 02/08/2022 Therex: Recumbent bike lvl 2 6 mins, seat 6 Leg press  81 lbs 2 x 15 double leg for warm up, 81 lbs x10 single leg bil Knee extension machine Double leg up , single leg down 2 x 10 10 lbs , performed bilaterally Knee flexion machine  SL 10 lbs  x 10 bilateral (stopped due to complaints of anterior inferior knee pulling on Lt) Seated AROM LAQ c pauses in end ranges x 10 bilateral  Neuro Re-ed SLS on black mat with contralateral leg corner touching light x 8 each, performed bilaterally SLS c vector reaching light touch contralateral leg fwd, side, and back x 6 each way  02/02/2022 Therex: Nustep Lvl 6 UE/LE 8 mins Leg press 75 lbs 2x10 single leg bil; 125# 2 x 15 bil push Step ups onto 8" step 2x10 bil Lateral step down control heel tap WB on Lt leg 2 x 10 c single hand assist  Neuro Re-ed SLS on black mat with contralateral leg corner touching light x 10 each, performed bilaterally Lateral stepping 3 cones x 6 bilateral  01/25/22 Therex: Nustep Lvl 6 UE/LE 8 mins Knee extension machine 10# 3x10 Knee flexion machine 20# 3x10 Leg press 75 lbs 2x10 single leg bil; 125# 3x10 bil push Step ups onto 8" step 2x10 bil Heel taps off 4" step 2x10 bil; light UE support and mod cues needed Seated SLR Lt 3# 2x10 Sit to/from stand without UE support  2x10    PATIENT EDUCATION:  12/31/2021 Education details: HEP, POC Person educated: Patient Education method: Consulting civil engineer, Media planner, Verbal cues, and Handouts Education comprehension: verbalized understanding, returned demonstration, and verbal cues required   HOME EXERCISE PROGRAM: Access Code: [email or web address] URL: [email or web address] Date: 12/31/2021 Prepared by: Scot Jun  Exercises - Supine Quadricep Sets  - 3-5 x daily - 7 x weekly - 1 sets - 10 reps - 5 hold - Supine Heel Slide (Mirrored)  - 3-5 x daily - 7 x weekly - 1 sets - 10 reps - 2 hold - Seated Quad Set (Mirrored)  - 3-5 x daily - 7 x weekly - 1 sets - 10 reps - 5 hold - Seated Long Arc Quad (Mirrored)  - 3-5 x daily - 7 x weekly - 1 sets - 5-10 reps - 2 hold - Seated Straight Leg Heel Taps  - 1-2 x daily - 7 x weekly - 1-2 sets - 5-10 reps - Sit to Stand  - 3 x daily - 7 x weekly - 1 sets - 10 reps - Seated Hamstring Stretch  - 1-2 x daily - 7 x weekly - 1 sets - 3-5 reps - 15 hold  ASSESSMENT:  CLINICAL IMPRESSION: Fair improvements noted in SLS assessment with values still below normative ranges.  Continued strengthening required to improve WB loading activity.    Of note, mild complaints of anterior inferior Lt knee pulling noted in full flexion today as well as in hamstring curl activity.   OBJECTIVE IMPAIRMENTS Abnormal gait, decreased activity tolerance, decreased balance, decreased coordination, decreased endurance, decreased mobility, difficulty walking, decreased ROM, decreased strength, hypomobility, increased edema, impaired perceived functional ability, impaired flexibility, improper body mechanics, and pain.   ACTIVITY LIMITATIONS cleaning, community activity, meal prep, laundry, and shopping.   PERSONAL FACTORS  HTN, hyperlipidemia, GERD, DM, RA, length of time since onset  are also affecting patient's functional outcome.    REHAB POTENTIAL: Fair to good  CLINICAL DECISION  MAKING: Stable/uncomplicated  EVALUATION COMPLEXITY: Low   GOALS: Goals reviewed with patient? Yes Eval: 07/07/2021 Short term PT Goals (target date for Short term goals are 3 weeks 01/21/2022) Patient will demonstrate independent use of home exercise program to maintain progress from in clinic treatments. Goal status: Met - 01/18/2022   Long term PT goals (target dates for all long term goals are 10 weeks  03/11/2022 )  1. Patient will demonstrate/report pain at worst less than or equal to 2/10 to facilitate minimal limitation in daily activity secondary to pain symptoms. Goal status: on going 02/08/2022  2. Patient will demonstrate independent use of home exercise program to facilitate ability to maintain/progress functional gains from skilled physical therapy services. Goal status: on going 02/08/2022  3. Patient will demonstrate FOTO outcome > or = 71 % to indicate reduced disability due to condition. Goal status: on going 02/08/2022  4.  Patient will demonstrate Lt knee AROM 0-115 degrees s symptoms to facilitate ability to perform transfers, sitting, ambulation, stair navigation s restriction due to mobility. Goal status: on going 02/08/2022  5.  Patient will demonstrate bilateral LE MMT 5/5, dynamometry improvement of > = 10 lbs to facilitate ability to perform usual standing, walking, stairs at PLOF s limitation due to symptoms.   Goal status: Met 02/02/2022  6.  Patient will demonstrate/report ability to ascend/descend stairs c reciprocal gait pattern for household entry.   Goal status: on going 02/08/2022  7.  Patient will demonstrate/report ability to return to gym workout routine.  a.  Goal Status: on going 02/08/2022   PLAN: PT FREQUENCY: 1-2x/week  PT DURATION: 10 weeks  PLANNED INTERVENTIONS:  Therapeutic exercises, Therapeutic activity, Neuro Muscular re-education, Balance training, Gait training, Patient/Family education, Joint mobilization, Stair training,  DME instructions, Dry Needling, Electrical stimulation, Cryotherapy, Moist heat, Taping, Ultrasound, Ionotophoresis 86m/ml Dexamethasone, and Manual therapy.  All included unless contraindicated  PLAN FOR NEXT SESSION:  Continued combination of OK[place] and CKC strengthening, balance control .    MScot Jun PT, DPT, OCS, ATC 02/08/22  10:03 AM

## 2022-02-08 NOTE — Progress Notes (Signed)
   Leslie Davis was reminded to have all medications, supplements and any blood glucose and blood pressure readings available for review with [name], Pharm. D, at her telephone visit on 02-10-2022 at 3:00.    Questions: Have you had any recent office visit or specialist visit outside of [facility]? Patient stated no  Are there any concerns you would like to discuss during your office visit? Patient stated no  Are you having any problems obtaining your medications? (Whether it pharmacy issues or cost) patient stated no  If patient has any PAP medications ask if they are having any problems getting their PAP medication or refill? Patient stated no  Care Gaps: Covid booster overdue Yearly ophthalmology  Star Rating Drug: Farxiga 10 mg- Last filled 12-28-2021 [facility] Rosuvastatin 20 mg- Last filled 12-28-2021 [facility] Ozempic 2 mg- Last filled 01-12-2022 [facility] Edarbi 80 mg- Last filled 12-28-2021 [facility]  Any gaps in medications fill history? No  [place] Pharmacist Assistant [phone]

## 2022-02-09 ENCOUNTER — Telehealth: Payer: Self-pay

## 2022-02-09 ENCOUNTER — Ambulatory Visit (INDEPENDENT_AMBULATORY_CARE_PROVIDER_SITE_OTHER): Payer: Medicare Other

## 2022-02-09 DIAGNOSIS — E1169 Type 2 diabetes mellitus with other specified complication: Secondary | ICD-10-CM

## 2022-02-09 DIAGNOSIS — I1310 Hypertensive heart and chronic kidney disease without heart failure, with stage 1 through stage 4 chronic kidney disease, or unspecified chronic kidney disease: Secondary | ICD-10-CM

## 2022-02-09 NOTE — Telephone Encounter (Signed)
  Care Management   Appointment Change    02/09/2022 Name: Leslie Davis MRN: [id number] DOB: Nov 24, 1955   Referred by: [name], MD Reason for referral : No chief complaint on file.   Successful patient contact was made. Rescheduled CCM appointment for 3 pm today. Patient reports that is a good time for her because she will be off of work .   Follow Up Plan: The patient has been provided with contact information for the care management team and has been advised to call with any health related questions or concerns.   [name], CPP, PharmD Clinical Pharmacist Practitioner [facility] [phone]

## 2022-02-09 NOTE — Progress Notes (Signed)
Chronic Care Management Pharmacy Note  02/17/2022 Name:  Leslie Davis MRN:  [id number] DOB:  05/12/1955  Summary: [name] reporets that she is doing well with her medication.   Recommendations/Changes made from today's visit: Recommend patient have BP check in office completed due to high BP readings.  Recommend eye exam.   Plan: Patient reports that she is going to start cheicking her BP and write down the readings daily.  HC to call weekly to review BP readings with Leslie Davis after 3 pm.  Patients eye exam scheduled for 02/22/2022, patient is aware that she has another visit on the same day and she is going to reschedule that visit.    Subjective: Leslie Davis is an 66 y.o. year old female who is a primary patient of [name], MD.  The CCM team was consulted for assistance with disease management and care coordination needs.    Engaged with patient by telephone for follow up visit in response to provider referral for pharmacy case management and/or care coordination services.   Consent to Services:  The patient was given information about Chronic Care Management services, agreed to services, and gave verbal consent prior to initiation of services.  Please see initial visit note for detailed documentation.   Patient Care Team: [name], MD as PCP - General (Internal Medicine) Minus Breeding, MD as PCP - Cardiology (Cardiology) Mayford Knife, [facility] (Pharmacist)  Recent office visits: 12/02/2021 PCP OV  Recent consult visits: 02/02/2022- Outpatient Rehab  01/25/2022 - Outpatient Rehab  01/18/2022- Outpatient Rehab  01/11/2022- Outpatient Rehab  01/04/2022 - [facility] visits: 02/05/2022 Left Breast Lumpectomy    Objective:  Lab Results  Component Value Date   CREATININE 0.99 02/01/2022   BUN 24 (H) 02/01/2022   GFR 47.43 (L) 12/23/2020   EGFR 40 (L) 12/02/2021   GFRNONAA >60 02/01/2022   GFRAA 57 (L) 02/21/2020    NA 140 02/01/2022   K 4.7 02/01/2022   CALCIUM 9.3 02/01/2022   CO2 24 02/01/2022   GLUCOSE 90 02/01/2022    Lab Results  Component Value Date/Time   HGBA1C 6.1 (H) 12/02/2021 10:56 AM   HGBA1C 6.0 (H) 09/16/2021 03:25 PM   GFR 47.43 (L) 12/23/2020 03:22 PM   GFR 77.09 11/17/2017 09:00 AM   MICROALBUR 150 11/15/2019 10:20 AM    Last diabetic Eye exam:  Lab Results  Component Value Date/Time   HMDIABEYEEXA No Retinopathy 12/22/2020 12:00 AM    Last diabetic Foot exam: No results found for: "HMDIABFOOTEX"   Lab Results  Component Value Date   CHOL 159 12/02/2021   HDL 85 12/02/2021   LDLCALC 62 12/02/2021   TRIG 57 12/02/2021   CHOLHDL 1.9 12/02/2021       Latest Ref Rng & Units 12/02/2021   10:56 AM 06/25/2021    1:50 PM 05/28/2021   12:52 PM  Hepatic Function  Total Protein 6.0 - 8.5 g/dL 6.8  7.9  7.2   Albumin 3.9 - 4.9 g/dL 4.2  4.2  4.5   AST 0 - 40 IU/L 19  47  24   ALT 0 - 32 IU/L 24  37  30   Alk Phosphatase 44 - 121 IU/L 127  94  128   Total Bilirubin 0.0 - 1.2 mg/dL <0.2  0.3  0.2     Lab Results  Component Value Date/Time   TSH 1.640 06/17/2021 04:06 PM   TSH 1.790 07/23/2020 10:42 AM  FREET4 1.00 07/23/2020 10:42 AM       Latest Ref Rng & Units 12/02/2021   10:56 AM 08/11/2021    9:33 AM 06/25/2021    1:50 PM  CBC  WBC 3.4 - 10.8 x10E3/uL 4.7  3.4  4.5   Hemoglobin 11.1 - 15.9 g/dL 13.1  13.5  13.6   Hematocrit 34.0 - 46.6 % 38.7  40.8  40.9   Platelets 150 - 450 x10E3/uL 268  261  245     Lab Results  Component Value Date/Time   VD25OH 48.3 09/14/2021 02:38 PM   VD25OH 82.1 03/31/2021 12:42 PM   VITAMINB12 628 12/02/2021 10:56 AM   VITAMINB12 549 06/17/2021 04:06 PM    Clinical ASCVD: Yes  The 10-year ASCVD risk score (Arnett DK, et al., 2019) is: 34.6%   Values used to calculate the score:     Age: 66 years     Sex: Female     Is Non-Hispanic African American: Yes     Diabetic: Yes     Tobacco smoker: No     Systolic Blood  Pressure: 175 mmHg     Is BP treated: Yes     HDL Cholesterol: 85 mg/dL     Total Cholesterol: 159 mg/dL       12/19/2021    1:39 PM 12/19/2021    1:37 PM 09/16/2021    2:57 PM  Depression screen PHQ 2/9  Decreased Interest 0 0 0  Down, Depressed, Hopeless 0 0 0  PHQ - 2 Score 0 0 0      Social History   Tobacco Use  Smoking Status Former   Packs/day: 0.25   Years: 1.00   Total pack years: 0.25   Types: Cigarettes   Quit date: 09/10/1974   Years since quitting: 47.4  Smokeless Tobacco Never  Tobacco Comments   she no longer smokes.    BP Readings from Last 3 Encounters:  02/05/22 (!) 175/61  12/09/21 (!) 140/77  12/02/21 120/74   Pulse Readings from Last 3 Encounters:  02/05/22 (!) 55  12/09/21 62  11/24/21 (!) 52   Wt Readings from Last 3 Encounters:  02/05/22 171 lb 11.8 oz (77.9 kg)  12/09/21 163 lb (73.9 kg)  12/02/21 165 lb 12.8 oz (75.2 kg)   BMI Readings from Last 3 Encounters:  02/05/22 30.42 kg/m  12/09/21 28.87 kg/m  12/02/21 29.37 kg/m    Assessment/Interventions: Review of patient past medical history, allergies, medications, health status, including review of consultants reports, laboratory and other test data, was performed as part of comprehensive evaluation and provision of chronic care management services.   SDOH:  (Social Determinants of Health) assessments and interventions performed: No SDOH Interventions    Flowsheet Row Office Visit from 12/02/2021 in [place] Visit from 09/16/2021 in [place] Visit from 08/06/2020 in [place] Management from 11/27/2019 in [facility] Clinical Support from 11/15/2019 in [facility]  SDOH Interventions       Depression Interventions/Treatment  PHQ2-9 Score <4 Follow-up Not Indicated PHQ2-9 Score <4 Follow-up Not Indicated Counseling -- PHQ2-9 Score <4 Follow-up Not  Indicated  Financial Strain Interventions -- -- -- Intervention Not Indicated --      SDOH Screenings   Food Insecurity: No Food Insecurity (12/19/2021)  Housing: Low Risk  (12/19/2021)  Transportation Needs: No Transportation Needs (12/19/2021)  Utilities: Not At Risk (12/19/2021)  Alcohol Screen: Low Risk  (  12/19/2021)  Depression (PHQ2-9): Low Risk  (12/19/2021)  Financial Resource Strain: Low Risk  (12/19/2021)  Physical Activity: Inactive (12/19/2021)  Social Connections: Socially Integrated (12/19/2021)  Stress: No Stress Concern Present (12/19/2021)  Tobacco Use: Medium Risk (02/15/2022)    CCM Care Plan  Allergies  Allergen Reactions   Fiasp [Insulin Aspart (W-Niacinamide)] Hives    Medications Reviewed Today     Reviewed by Girtha Rm, PT (Physical Therapist) on 02/15/22 at 0941  Med List Status: <None>   Medication Order Taking? Sig Documenting Provider Last Dose Status Informant  amLODipine (NORVASC) 10 MG tablet [id number] No TAKE ONE TABLET BY MOUTH ONCE DAILY [name], FNP 02/04/2022 1900 Active   aspirin EC 81 MG tablet [id number] No Take 1 tablet (81 mg total) by mouth daily. Swallow whole. [name], MD Past Week Active   Blood Glucose Monitoring Suppl [facility] VERIO) w/Device Drucie Opitz [id number] No Use as directed to check blood sugars 2 times per day dx: e11.65 [name], MD Taking Active Self  carvedilol (COREG) 3.125 MG tablet [id number] No TAKE ONE TABLET BY MOUTH TWICE DAILY WITH A MEAL Tolia, Sunit, DO 02/05/2022 0500 Active   celecoxib (CELEBREX) 200 MG capsule [id number] No Take 1 capsule (200 mg total) by mouth 2 (two) times daily as needed. Aundra Dubin, PA-C Past Week Active   cetirizine (ZYRTEC) 10 MG tablet [id number] No Take 1 tablet (10 mg total) by mouth daily. [name], MD Past Week Active   EDARBI 80 MG TABS [id number] No TAKE ONE TABLET BY MOUTH ONCE DAILY [name], MD 02/04/2022 Active   FARXIGA 10 MG TABS tablet [id number]  No TAKE ONE TABLET BY MOUTH ONCE DAILY BEFORE Adella Nissen, MD Past Week Active   ferrous gluconate (FERGON) 324 MG tablet [id number] No TAKE 1 TABLET BY MOUTH TWICE DAILY WITH A MEAL.  Patient taking differently: Take 324 mg by mouth 2 (two) times daily with a meal.   Mansouraty, Telford Nab., MD Past Week Active Self  glucose blood [facility] VERIO) test strip [id number]  USE TO check blood glucose TWICE DAILY AS DIRECTED [name], MD  Active   linaclotide [facility]) 145 MCG CAPS capsule [id number] No Take 1 capsule (145 mcg total) by mouth daily.  Patient taking differently: Take 145 mcg by mouth daily as needed (constipation).   [name], DO More than a month Active Self  nitroGLYCERIN (NITROSTAT) 0.4 MG SL tablet [id number] No Place 1 tablet (0.4 mg total) under the tongue every 5 (five) minutes as needed for chest pain. If you require more than two tablets five minutes apart go to the nearest ER via EMS. Terri Skains, Sunit, DO More than a month Expired 01/29/22 2359 Self  OneTouch Delica Lancets 08X MISC [id number] No Use as directed to check blood sugars 2 times per day dx:e11.65 [name], MD Taking Active Self  oxyCODONE (ROXICODONE) 5 MG immediate release tablet [id number]  Take 1 tablet (5 mg total) by mouth every 6 (six) hours as needed for severe pain. Autumn Messing III, MD  Active   pantoprazole (PROTONIX) 40 MG tablet [id number] No Take 1 tablet (40 mg total) by mouth every morning. [name], MD Past Month Active   rosuvastatin (CRESTOR) 20 MG tablet [id number] No TAKE ONE TABLET BY MOUTH ONCE DAILY [name], MD 02/04/2022 Active   Semaglutide, 2 MG/DOSE, 8 MG/3ML SOPN [id number] No Inject 2 mg as directed once a week. [name], MD 01/28/2022 Active   Sennosides (EX-LAX PO) [id number] No  Take by mouth. [provider] Past Month Active   temazepam (RESTORIL) 30 MG capsule [id number] No TAKE ONE CAPSULE BY MOUTH EVERYDAY AT BEDTIME AS NEEDED [name], MD Past Week Active Self  traMADol (ULTRAM) 50 MG tablet [id number] No Take 1 tablet (50 mg total) by mouth every 12 (twelve) hours as needed. Aundra Dubin, PA-C More than a month Active   TRUEPLUS PEN NEEDLES 31G X 6 MM MISC [id number] No USE WITH pen TO INJECT insulin DAILY [name], FNP Taking Active Self            Patient Active Problem List   Diagnosis Date Noted   Coronary artery disease of native artery of native heart with stable angina pectoris (HCC)    Other constipation 05/25/2021   Class 2 severe obesity with serious comorbidity and body mass index (BMI) of 35.0 to 35.9 in adult ([place]) 12/01/2020   Status post total left knee replacement 10/27/2020   Primary osteoarthritis of left knee    NAFLD (nonalcoholic fatty liver disease) 08/06/2020   Other fatigue 07/23/2020   SOBOE (shortness of breath on exertion) 07/23/2020   Diabetes mellitus ([place]) 07/23/2020   Hypertension associated with type 2 diabetes mellitus ([place]) 07/23/2020   Hyperlipidemia associated with type 2 diabetes mellitus ([place]) 07/23/2020   Absolute anemia 07/23/2020   Vitamin D deficiency 07/23/2020   At risk for heart disease 07/23/2020   AKI (acute kidney injury) ([place]) 09/07/2019   Acute kidney injury ([place]) 09/06/2019   Bradycardia 09/06/2019   Hyperglycemia 09/06/2019   HLD (hyperlipidemia)    Hypertensive nephropathy 05/16/2019   Erosive gastritis 11/16/2017   Fatty liver 11/16/2017   Abdominal pain 12/11/2012   Early satiety 12/11/2012   Hiatal hernia    Hx of cholecystectomy 02/05/2011   Flu-like symptoms 02/05/2011   CONSTIPATION 08/27/2008   FATTY LIVER DISEASE 05/07/2008   Nausea with vomiting 04/23/2008   EPIGASTRIC PAIN 04/23/2008   RENAL CALCULUS 04/22/2008   Type 2 diabetes mellitus with stage 2 chronic kidney disease, with long-term current use of insulin ([place]) 05/19/2006   Type 2 diabetes mellitus with hyperlipidemia ([place]) 05/19/2006   HYPERTENSION, BENIGN SYSTEMIC  05/19/2006   GASTROESOPHAGEAL REFLUX, NO ESOPHAGITIS 05/19/2006   AMENORRHEA 05/19/2006    Immunization History  Administered Date(s) Administered   DTaP 03/26/2013   Fluad Quad(high Dose 65+) 11/08/2018, 02/21/2020, 02/26/2021   Influenza, High Dose Seasonal PF 11/08/2018   Influenza-Unspecified 11/30/2017   Moderna Sars-Covid-2 Vaccination 08/03/2019, 08/31/2019   Td 08/20/2005    Conditions to be addressed/monitored:  Hypertension, Hyperlipidemia, and Constipation  Care Plan : [place]  Updates made by Mayford Knife, [place] since 02/17/2022 12:00 AM     Problem: HTN, HLD, Constipation      Long-Range Goal: DISEASE MANAGEMENT   Recent Progress: On track  Priority: High  Note:   Current Barriers:  Unable to independently monitor therapeutic efficacy Unable to achieve control of hypertension   Pharmacist Clinical Goal(s):  Patient will achieve adherence to monitoring guidelines and medication adherence to achieve therapeutic efficacy through collaboration with PharmD and provider.   Interventions: 1:1 collaboration with [name], MD regarding development and update of comprehensive plan of care as evidenced by provider attestation and co-signature Inter-disciplinary care team collaboration (see longitudinal plan of care) Comprehensive medication review performed; medication list updated in electronic medical record  Hypertension (BP goal <130/80) -Uncontrolled -Current treatment: Norvasc 10 mg tablet once per day Appropriate, Effective, Safe, Accessible [name] 80 mg  tablet once per day Appropriate, Query effective Carvedilol 3.125 mg tablet twice per day Appropriate, Query effective  -Current home readings:11/15-135/78, 11/18-146/83, patient reports that today her BP was elevated today as well  -Current dietary habits: recommended patient limit the amount of salt she uses on her food, she reports that most of the time she just sprinkles it on.   -Current exercise habits:   -Denies hypotensive/hypertensive symptoms -Educated on Daily salt intake goal < 2300 mg; Importance of home blood pressure monitoring; Proper BP monitoring technique; -Counseled to monitor BP at home at least once per day, document, and provide log at future appointments -Ms. Holstrom reports that she has had the cuff for less than a  year. She reported an E1 error so coached her through how to use the BP monitor to avoid this type of error  -Recommended to continue current medication -Collaborate with PCP due to patients elevated BP readings for a BP check in office. PCP agreed with this idea, patient notified of plan and in agreement.   Hyperlipidemia: (LDL goal < 55) -Not ideally controlled -Current treatment: Rosuvastatin 20 mg tablet by mouth once daily Appropriate, Query effective, -Current dietary patterns: she reports that she is doing well with her eating habits, yesterday she had salmon, rice and fish. She does report that she limits fried fatty foods, but she does eat bacon sometimes, like this morning.  -Current exercise habits: she is exercising, she goes on the bicycle and does leg work and upper body work. She is also walking about 6 miles.  -Educated on Exercise goal of 150 minutes per week; -Counseled on diet and exercise extensively Recommended to continue current medication for now. Potential for patients medication to be increased or changed will review this option during the next visit.    Constipation  (Goal: reduce constipation ) -Controlled -Current treatment  [name] 145 mcg taking 1 tablet by mouth daily Appropriate, Effective, Safe, Accessible Sennosides-take 1 tablet by mouth daily Appropriate, Effective, Safe, Accessible -We discussed the increase risk of constipation while taking the Oxycodone after surgery.  -Ms. Ambrose has [name] available if necessary, and reports having a bowel movement in the last two days.  -Recommended  to continue current medication Collaborated with PCP to determine if patient needs BP check visits due to changes in her BP  since last visit.   Patient Goals/Self-Care Activities Patient will:  - take medications as prescribed as evidenced by patient report and record review focus on medication adherence by taking her medication at the same time each day.  check blood pressure at least four times per week and , document, and provide at future appointments  Follow Up Plan: The patient has been provided with contact information for the care management team and has been advised to call with any health related questions or concerns.       Medication Assistance: None required.  Patient affirms current coverage meets needs.  Compliance/Adherence/Medication fill history: Care Gaps: COVID-19 Vaccine Ophthalmology Exam  Star-Rating Drugs: Farxiga 10 mg tablet  Roisuvastatin 10 mg tablet  Ozempic 2 mg injection   Patient's preferred pharmacy is:  [facility] - [place], [place] - [address] [place] [address] Phone: [phone] Fax: [phone]  [place], [place] [address] Phone: [phone] Fax: [phone]  Uses pill box? Yes Pt endorses 90% compliance  We discussed:  Benefits of medication synchronization, packaging and delivery as well as enhanced pharmacist oversight with [employer]. Patient decided to: Continue current medication management strategy  Care Plan and Follow Up Patient Decision:  Patient agrees to Care Plan and Follow-up.  Plan: The patient has been provided with contact information for the care management team and has been advised to call with any health related questions or concerns.   [name], CPP, PharmD Clinical Pharmacist Practitioner [facility] [phone]

## 2022-02-10 ENCOUNTER — Telehealth: Payer: Medicare Other

## 2022-02-12 ENCOUNTER — Telehealth: Payer: Self-pay

## 2022-02-15 ENCOUNTER — Encounter: Payer: Self-pay | Admitting: Rehabilitative and Restorative Service Providers"

## 2022-02-15 ENCOUNTER — Other Ambulatory Visit: Payer: Self-pay | Admitting: Cardiology

## 2022-02-15 ENCOUNTER — Ambulatory Visit (INDEPENDENT_AMBULATORY_CARE_PROVIDER_SITE_OTHER): Payer: Medicare Other | Admitting: Rehabilitative and Restorative Service Providers"

## 2022-02-15 DIAGNOSIS — M25562 Pain in left knee: Secondary | ICD-10-CM | POA: Diagnosis not present

## 2022-02-15 DIAGNOSIS — R600 Localized edema: Secondary | ICD-10-CM

## 2022-02-15 DIAGNOSIS — R262 Difficulty in walking, not elsewhere classified: Secondary | ICD-10-CM

## 2022-02-15 DIAGNOSIS — G8929 Other chronic pain: Secondary | ICD-10-CM

## 2022-02-15 DIAGNOSIS — M6281 Muscle weakness (generalized): Secondary | ICD-10-CM

## 2022-02-15 DIAGNOSIS — I2510 Atherosclerotic heart disease of native coronary artery without angina pectoris: Secondary | ICD-10-CM

## 2022-02-15 NOTE — Therapy (Signed)
OUTPATIENT PHYSICAL THERAPY TREATMENT   Patient Name: Leslie Davis MRN: [id number] DOB:17-Nov-1955, 66 y.o., female Today's Date: 02/15/2022  END OF SESSION:   PT End of Session - 02/15/22 0942     Visit Number 8    Number of Visits 20    Date for PT Re-Evaluation 03/11/22    Authorization Type [employer] Medicare 20% co insurance    Progress Note Due on Visit 10    PT Start Time 0930    PT Stop Time 1010    PT Time Calculation (min) 40 min    Activity Tolerance Patient tolerated treatment well    Behavior During Therapy WFL for tasks assessed/performed                     Past Medical History:  Diagnosis Date   CKD (chronic kidney disease)    stage 3   Coronary artery disease 07/2021   obstructive CAD-70% LAD-treated medically   DM (diabetes mellitus) ([place])    Fatty liver disease, nonalcoholic    Gastritis    Gastroparesis    GERD (gastroesophageal reflux disease)    Heartburn    Hiatal hernia    HLD (hyperlipidemia)    HTN (hypertension)    Joint pain    Renal calculus    Rheumatoid arthritis ([place])    Vitamin D deficiency    Past Surgical History:  Procedure Laterality Date   BREAST BIOPSY  02/04/2022   MM LT RADIOACTIVE SEED EA ADD LESION LOC MAMMO GUIDE 02/04/2022 [facility]   BREAST BIOPSY  02/04/2022   MM LT RADIOACTIVE SEED LOC MAMMO GUIDE 02/04/2022 [facility]   BREAST LUMPECTOMY WITH RADIOACTIVE SEED LOCALIZATION Left 02/05/2022   Procedure: LEFT BREAST BRACKETED LUMPECTOMY WITH RADIOACTIVE SEED LOCALIZATION;  Surgeon: Jovita Kussmaul, MD;  Location: [facility];  Service: General;  Laterality: Left;   CHOLECYSTECTOMY     COLONOSCOPY  09/10/2011   normal   JOINT REPLACEMENT  2022   left TKA   LEFT HEART CATH AND CORONARY ANGIOGRAPHY N/A 08/18/2021   Procedure: LEFT HEART CATH AND CORONARY ANGIOGRAPHY;  Surgeon: Nigel Mormon, MD;  Location: [facility];  Service: Cardiovascular;  Laterality: N/A;    TOTAL KNEE ARTHROPLASTY Left 10/27/2020   Procedure: LEFT TOTAL KNEE ARTHROPLASTY;  Surgeon: [name], MD;  Location: [facility];  Service: Orthopedics;  Laterality: Left;   TUBAL LIGATION     UPPER GASTROINTESTINAL ENDOSCOPY     Patient Active Problem List   Diagnosis Date Noted   Coronary artery disease of native artery of native heart with stable angina pectoris ([place])    Other constipation 05/25/2021   Class 2 severe obesity with serious comorbidity and body mass index (BMI) of 35.0 to 35.9 in adult [facility] 12/01/2020   Status post total left knee replacement 10/27/2020   Primary osteoarthritis of left knee    NAFLD (nonalcoholic fatty liver disease) 08/06/2020   Other fatigue 07/23/2020   SOBOE (shortness of breath on exertion) 07/23/2020   Diabetes mellitus ([place]) 07/23/2020   Hypertension associated with type 2 diabetes mellitus ([place]) 07/23/2020   Hyperlipidemia associated with type 2 diabetes mellitus ([place]) 07/23/2020   Absolute anemia 07/23/2020   Vitamin D deficiency 07/23/2020   At risk for heart disease 07/23/2020   AKI (acute kidney injury) ([place]) 09/07/2019   Acute kidney injury ([place]) 09/06/2019   Bradycardia 09/06/2019   Hyperglycemia 09/06/2019   HLD (hyperlipidemia)    Hypertensive nephropathy 05/16/2019  Erosive gastritis 11/16/2017   Fatty liver 11/16/2017   Abdominal pain 12/11/2012   Early satiety 12/11/2012   Hiatal hernia    Hx of cholecystectomy 02/05/2011   Flu-like symptoms 02/05/2011   CONSTIPATION 08/27/2008   FATTY LIVER DISEASE 05/07/2008   Nausea with vomiting 04/23/2008   EPIGASTRIC PAIN 04/23/2008   RENAL CALCULUS 04/22/2008   Type 2 diabetes mellitus with stage 2 chronic kidney disease, with long-term current use of insulin ([place]) 05/19/2006   Type 2 diabetes mellitus with hyperlipidemia ([place]) 05/19/2006   HYPERTENSION, BENIGN SYSTEMIC 05/19/2006   GASTROESOPHAGEAL REFLUX, NO ESOPHAGITIS 05/19/2006   AMENORRHEA 05/19/2006    PCP: [name], MD  REFERRING PROVIDER: [name], MD  REFERRING DIAG: [phone] (ICD-10-CM) - Status post total left knee replacement  THERAPY DIAG:  Chronic pain of left knee  Muscle weakness (generalized)  Difficulty in walking, not elsewhere classified  Localized edema  ONSET DATE: 10/27/2020 - TKA, acute on chronic a few weeks  SUBJECTIVE:   SUBJECTIVE STATEMENT: Pt indicated feeling pretty good over the weekend.  Pt indicated not too much walking over weekend.   PERTINENT HISTORY: DM, HTN  PAIN:  NPRS scale: upon arrival 0/10 Pain location: Lt knee Pain description: chronic  Aggravating factors: walking Relieving factors: medicine with very painful  PRECAUTIONS: None  WEIGHT BEARING RESTRICTIONS No  FALLS:  Has patient fallen in last 6 months? 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      Additional Comments 3 steps to enter-has handrail on Rt side     OCCUPATION: Retired  PLOF:  Level of [employer] Retired     Leisure works out at gym     PATIENT GOALS Reduce pain, walking    OBJECTIVE:   [place]:  02/02/2022:  FOTO update 59  12/31/2021: FOTO intake: 51  predicted:  71  COGNITION: 12/31/2021 Overall cognitive status: Within functional limits for tasks assessed     PALPATION:  12/31/2021 : Mild tenderness to touch anterior knee joint, inferior patella Lt  LE ROM:  Active ROM Right 12/31/2021 Left 12/31/2021 Left 02/15/2022  Knee flexion  110 AROM supine heel slide c pain anterior knee   Knee extension  -3 in seated LAQ AROM  0 PROM in supine heel prop 0 in seated LAQ   (Blank rows = not tested)  LE MMT:  MMT Right 12/31/2021 Left 12/31/2021 Left 01/18/2022 Left 02/02/2022  Hip flexion 5/5 5/5    Hip extension      Hip abduction      Hip adduction      Hip internal rotation      Hip external rotation      Knee flexion 5/5 4/5    Knee extension 5/5 32.9, 31.9 lbs  4/5 23.5, 19.5 lbs c pain 4/5 24.7, 22.1 lbs 5/5 30.1, 29.3 5/5  Ankle dorsiflexion 5/5 5/5    Ankle plantarflexion      Ankle inversion      Ankle eversion       (Blank rows = not tested)   FUNCTIONAL TESTS:  02/08/2022: Lt SLS: 7 seconds     Rt SLS:  6 seconds  12/31/2021 TUG: independent 10.64 seconds Lt SLS:  3 seconds   Rt SLS: 5 seconds 18 inch transfer s UE assist: on first try with increased difficulty/pain in mid range Lt knee  GAIT: 12/31/2021 Distance walked: Household distances within clinic  Assistive device utilized: Independent  Comments: Mild maintained  knee flexion in stance Lt leg, mild reduction of stance on Lt leg.     TODAY'S TREATMENT: 02/15/2022 Therex: UBE LE only lvl 4.0 6 mins Leg press  125 lbs 2 x 15 double leg for warm up, 62 lbs x15 single leg bil Knee extension machine Double leg up , single leg down 2 x 10 10 lbs , performed bilaterally Step on over and down 4 inch step c slow lowering focus x 15, performed bilaterally   Neuro Re-ed SLS on black mat with contralateral leg corner touching light x 8 each, performed bilaterally Tandem stance on foam occasional hand assist 1 min x 2 bilateral  02/08/2022 Therex: Recumbent bike lvl 2 6 mins, seat 6 Leg press  81 lbs 2 x 15 double leg for warm up, 81 lbs x10 single leg bil Knee extension machine Double leg up , single leg down 2 x 10 10 lbs , performed bilaterally Knee flexion machine  SL 10 lbs  x 10 bilateral (stopped due to complaints of anterior inferior knee pulling on Lt) Seated AROM LAQ c pauses in end ranges x 10 bilateral  Neuro Re-ed SLS on black mat with contralateral leg corner touching light x 8 each, performed bilaterally SLS c vector reaching light touch contralateral leg fwd, side, and back x 6 each way  02/02/2022 Therex: Nustep Lvl 6 UE/LE 8 mins Leg press 75 lbs 2x10 single leg bil; 125# 2 x 15 bil push Step ups onto 8" step 2x10 bil Lateral step down control heel  tap WB on Lt leg 2 x 10 c single hand assist  Neuro Re-ed SLS on black mat with contralateral leg corner touching light x 10 each, performed bilaterally Lateral stepping 3 cones x 6 bilateral   PATIENT EDUCATION:  12/31/2021 Education details: HEP, POC Person educated: Patient Education method: Consulting civil engineer, Media planner, Verbal cues, and Handouts Education comprehension: verbalized understanding, returned demonstration, and verbal cues required   HOME EXERCISE PROGRAM: Access Code: [email or web address] URL: [email or web address] Date: 12/31/2021 Prepared by: Scot Jun  Exercises - Supine Quadricep Sets  - 3-5 x daily - 7 x weekly - 1 sets - 10 reps - 5 hold - Supine Heel Slide (Mirrored)  - 3-5 x daily - 7 x weekly - 1 sets - 10 reps - 2 hold - Seated Quad Set (Mirrored)  - 3-5 x daily - 7 x weekly - 1 sets - 10 reps - 5 hold - Seated Long Arc Quad (Mirrored)  - 3-5 x daily - 7 x weekly - 1 sets - 5-10 reps - 2 hold - Seated Straight Leg Heel Taps  - 1-2 x daily - 7 x weekly - 1-2 sets - 5-10 reps - Sit to Stand  - 3 x daily - 7 x weekly - 1 sets - 10 reps - Seated Hamstring Stretch  - 1-2 x daily - 7 x weekly - 1 sets - 3-5 reps - 15 hold  ASSESSMENT:  CLINICAL IMPRESSION: Continued focus on improving quad strength and control to help improve walking and stair navigation tolerance.  Lt leg continued to be reported as harder than Rt on same activity.   Plan to recheck Dynamometry next visit.   OBJECTIVE IMPAIRMENTS Abnormal gait, decreased activity tolerance, decreased balance, decreased coordination, decreased endurance, decreased mobility, difficulty walking, decreased ROM, decreased strength, hypomobility, increased edema, impaired perceived functional ability, impaired flexibility, improper body mechanics, and pain.   ACTIVITY LIMITATIONS cleaning, community activity, meal prep, laundry, and shopping.   PERSONAL FACTORS  HTN, hyperlipidemia, GERD, DM, RA, length of  time since onset  are also affecting patient's functional outcome.    REHAB POTENTIAL: Fair to good  CLINICAL DECISION MAKING: Stable/uncomplicated  EVALUATION COMPLEXITY: Low   GOALS: Goals reviewed with patient? Yes Eval: 07/07/2021 Short term PT Goals (target date for Short term goals are 3 weeks 01/21/2022) Patient will demonstrate independent use of home exercise program to maintain progress from in clinic treatments. Goal status: Met - 01/18/2022   Long term PT goals (target dates for all long term goals are 10 weeks  03/11/2022 )  1. Patient will demonstrate/report pain at worst less than or equal to 2/10 to facilitate minimal limitation in daily activity secondary to pain symptoms. Goal status: on going 02/08/2022  2. Patient will demonstrate independent use of home exercise program to facilitate ability to maintain/progress functional gains from skilled physical therapy services. Goal status: on going 02/08/2022  3. Patient will demonstrate FOTO outcome > or = 71 % to indicate reduced disability due to condition. Goal status: on going 02/08/2022  4.  Patient will demonstrate Lt knee AROM 0-115 degrees s symptoms to facilitate ability to perform transfers, sitting, ambulation, stair navigation s restriction due to mobility. Goal status: on going 02/08/2022  5.  Patient will demonstrate bilateral LE MMT 5/5, dynamometry improvement of > = 10 lbs to facilitate ability to perform usual standing, walking, stairs at PLOF s limitation due to symptoms.   Goal status: Met 02/02/2022  6.  Patient will demonstrate/report ability to ascend/descend stairs c reciprocal gait pattern for household entry.   Goal status: on going 02/08/2022  7.  Patient will demonstrate/report ability to return to gym workout routine.  a.  Goal Status: on going 02/08/2022   PLAN: PT FREQUENCY: 1-2x/week  PT DURATION: 10 weeks  PLANNED INTERVENTIONS:  Therapeutic exercises, Therapeutic activity,  Neuro Muscular re-education, Balance training, Gait training, Patient/Family education, Joint mobilization, Stair training, DME instructions, Dry Needling, Electrical stimulation, Cryotherapy, Moist heat, Taping, Ultrasound, Ionotophoresis 41m/ml Dexamethasone, and Manual therapy.  All included unless contraindicated  PLAN FOR NEXT SESSION:  Dynamometry check.    MScot Jun PT, DPT, OCS, ATC 02/15/22  10:11 AM

## 2022-02-17 ENCOUNTER — Telehealth: Payer: Self-pay

## 2022-02-17 NOTE — Patient Instructions (Signed)
Visit Information It was great speaking with you today!  Please let me know if you have any questions about our visit.   Goals Addressed             This Visit's Progress    Manage My Medicine       Timeframe:  Long-Range Goal Priority:  High Start Date:                             Expected End Date:                       Follow Up Date 04/14/2022   In Progress:   - call if I am sick and can't take my medicine - please try to take your medications at the same time each day - use a pillbox to sort medicine - use an alarm clock or phone to remind me to take my medicine    Why is this important?   These steps will help you keep on track with your medicines.            Patient Care Plan: CCM Pharmacy Care Plan     Problem Identified: HTN, HLD, Constipation      Long-Range Goal: DISEASE MANAGEMENT   Recent Progress: On track  Priority: High  Note:   Current Barriers:  Unable to independently monitor therapeutic efficacy Unable to achieve control of hypertension   Pharmacist Clinical Goal(s):  Patient will achieve adherence to monitoring guidelines and medication adherence to achieve therapeutic efficacy through collaboration with PharmD and provider.   Interventions: 1:1 collaboration with [name], MD regarding development and update of comprehensive plan of care as evidenced by provider attestation and co-signature Inter-disciplinary care team collaboration (see longitudinal plan of care) Comprehensive medication review performed; medication list updated in electronic medical record  Hypertension (BP goal <130/80) -Uncontrolled -Current treatment: Norvasc 10 mg tablet once per day Appropriate, Effective, Safe, Accessible [name] 80 mg tablet once per day Appropriate, Query effective Carvedilol 3.125 mg tablet twice per day Appropriate, Query effective  -Current home readings:11/15-135/78, 11/18-146/83, patient reports that today her BP was elevated  today as well  -Current dietary habits: recommended patient limit the amount of salt she uses on her food, she reports that most of the time she just sprinkles it on.  -Current exercise habits:   -Denies hypotensive/hypertensive symptoms -Educated on Daily salt intake goal < 2300 mg; Importance of home blood pressure monitoring; Proper BP monitoring technique; -Counseled to monitor BP at home at least once per day, document, and provide log at future appointments -Leslie Davis reports that she has had the cuff for less than a  year. She reported an E1 error so coached her through how to use the BP monitor to avoid this type of error  -Recommended to continue current medication -Collaborate with PCP due to patients elevated BP readings for a BP check in office. PCP agreed with this idea, patient notified of plan and in agreement.   Hyperlipidemia: (LDL goal < 55) -Not ideally controlled -Current treatment: Rosuvastatin 20 mg tablet by mouth once daily Appropriate, Query effective, -Current dietary patterns: she reports that she is doing well with her eating habits, yesterday she had salmon, rice and fish. She does report that she limits fried fatty foods, but she does eat bacon sometimes, like this morning.  -Current exercise habits: she is exercising, she goes on the bicycle and does  leg work and upper body work. She is also walking about 6 miles.  -Educated on Exercise goal of 150 minutes per week; -Counseled on diet and exercise extensively Recommended to continue current medication for now. Potential for patients medication to be increased or changed will review this option during the next visit.    Constipation  (Goal: reduce constipation ) -Controlled -Current treatment  [name] 145 mcg taking 1 tablet by mouth daily Appropriate, Effective, Safe, Accessible Sennosides-take 1 tablet by mouth daily Appropriate, Effective, Safe, Accessible -We discussed the increase risk of constipation  while taking the Oxycodone after surgery.  -Leslie Davis has [name] available if necessary, and reports having a bowel movement in the last two days.  -Recommended to continue current medication Collaborated with PCP to determine if patient needs BP check visits due to changes in her BP  since last visit.   Patient Goals/Self-Care Activities Patient will:  - take medications as prescribed as evidenced by patient report and record review focus on medication adherence by taking her medication at the same time each day.  check blood pressure at least five days per week and , document, and provide at future appointments  Follow Up Plan: The patient has been provided with contact information for the care management team and has been advised to call with any health related questions or concerns.       Patient agreed to services and verbal consent obtained.   The patient verbalized understanding of instructions, educational materials, and care plan provided today and agreed to receive a mailed copy of patient instructions, educational materials, and care plan.   Leslie Davis, PharmD Clinical Pharmacist [facility] [phone]

## 2022-02-17 NOTE — Telephone Encounter (Signed)
  Care Management   Follow Up Note   02/17/2022 Name: Leslie Davis MRN: [id number] DOB: 1955-05-15   Referred by: [name], MD Reason for referral : Chronic Care Management (Pharmacist )  Collaborated with PCP team and discussed patients BP reading. BP check scheduled for 02/23/2022 at 11:40 at clinic. Patient called and made aware, she voiced understanding and said she would be able to come to the visit.    Follow Up Plan: The patient has been provided with contact information for the care management team and has been advised to call with any health related questions or concerns.   [name], CPP, PharmD Clinical Pharmacist Practitioner [facility] [phone]

## 2022-02-18 ENCOUNTER — Encounter (HOSPITAL_COMMUNITY): Payer: Self-pay

## 2022-02-18 DIAGNOSIS — I1310 Hypertensive heart and chronic kidney disease without heart failure, with stage 1 through stage 4 chronic kidney disease, or unspecified chronic kidney disease: Secondary | ICD-10-CM

## 2022-02-18 DIAGNOSIS — E785 Hyperlipidemia, unspecified: Secondary | ICD-10-CM

## 2022-02-18 DIAGNOSIS — E1169 Type 2 diabetes mellitus with other specified complication: Secondary | ICD-10-CM

## 2022-02-22 ENCOUNTER — Ambulatory Visit (INDEPENDENT_AMBULATORY_CARE_PROVIDER_SITE_OTHER): Payer: Medicare Other | Admitting: Family Medicine

## 2022-02-23 ENCOUNTER — Ambulatory Visit: Payer: Medicare Other

## 2022-02-23 ENCOUNTER — Ambulatory Visit: Payer: Medicare Other | Admitting: Cardiology

## 2022-02-23 ENCOUNTER — Encounter: Payer: Self-pay | Admitting: Cardiology

## 2022-02-23 VITALS — BP 142/78 | HR 60 | Resp 16 | Ht 64.0 in | Wt 170.6 lb

## 2022-02-23 VITALS — BP 108/74 | HR 58 | Temp 98.1°F | Ht 64.0 in | Wt 170.0 lb

## 2022-02-23 DIAGNOSIS — E1169 Type 2 diabetes mellitus with other specified complication: Secondary | ICD-10-CM | POA: Diagnosis not present

## 2022-02-23 DIAGNOSIS — I700 Atherosclerosis of aorta: Secondary | ICD-10-CM

## 2022-02-23 DIAGNOSIS — I1310 Hypertensive heart and chronic kidney disease without heart failure, with stage 1 through stage 4 chronic kidney disease, or unspecified chronic kidney disease: Secondary | ICD-10-CM

## 2022-02-23 DIAGNOSIS — I10 Essential (primary) hypertension: Secondary | ICD-10-CM

## 2022-02-23 DIAGNOSIS — I2584 Coronary atherosclerosis due to calcified coronary lesion: Secondary | ICD-10-CM | POA: Diagnosis not present

## 2022-02-23 DIAGNOSIS — N1831 Chronic kidney disease, stage 3a: Secondary | ICD-10-CM

## 2022-02-23 DIAGNOSIS — E1122 Type 2 diabetes mellitus with diabetic chronic kidney disease: Secondary | ICD-10-CM | POA: Diagnosis not present

## 2022-02-23 DIAGNOSIS — I129 Hypertensive chronic kidney disease with stage 1 through stage 4 chronic kidney disease, or unspecified chronic kidney disease: Secondary | ICD-10-CM | POA: Diagnosis not present

## 2022-02-23 DIAGNOSIS — E785 Hyperlipidemia, unspecified: Secondary | ICD-10-CM | POA: Diagnosis not present

## 2022-02-23 DIAGNOSIS — I2510 Atherosclerotic heart disease of native coronary artery without angina pectoris: Secondary | ICD-10-CM

## 2022-02-23 DIAGNOSIS — Z794 Long term (current) use of insulin: Secondary | ICD-10-CM | POA: Diagnosis not present

## 2022-02-23 MED ORDER — EZETIMIBE 10 MG PO TABS: 10.0000 mg | ORAL_TABLET | Freq: Every day | ORAL | 3 refills | Status: DC

## 2022-02-23 NOTE — Progress Notes (Signed)
Pt presents today for bpc. She reports taking Edarbi 80MG in the morning, she did take medication before coming in today. Amlodipine 10MG at night. She drinks 6 bottles of water a day. She does PT once a week and will try to go to the gym on some Saturdays. For breakfast she had 2 boiled eggs & a pancake. Sprinkled a little bit of salt on the eggs. She did have appointment with cardiologist this morning.  BP Readings from Last 3 Encounters:  02/23/22 108/74  02/23/22 (!) 142/78  02/05/22 (!) 175/61  Patient will follow up with provider on 03/23/2021.

## 2022-02-23 NOTE — Patient Instructions (Signed)
Hypertension, Adult Hypertension is another name for high blood pressure. High blood pressure forces your heart to work harder to pump blood. This can cause problems over time. There are two numbers in a blood pressure reading. There is a top number (systolic) over a bottom number (diastolic). It is best to have a blood pressure that is below 120/80. What are the causes? The cause of this condition is not known. Some other conditions can lead to high blood pressure. What increases the risk? Some lifestyle factors can make you more likely to develop high blood pressure: Smoking. Not getting enough exercise or physical activity. Being overweight. Having too much fat, sugar, calories, or salt (sodium) in your diet. Drinking too much alcohol. Other risk factors include: Having any of these conditions: Heart disease. Diabetes. High cholesterol. Kidney disease. Obstructive sleep apnea. Having a family history of high blood pressure and high cholesterol. Age. The risk increases with age. Stress. What are the signs or symptoms? High blood pressure may not cause symptoms. Very high blood pressure (hypertensive crisis) may cause: Headache. Fast or uneven heartbeats (palpitations). Shortness of breath. Nosebleed. Vomiting or feeling like you may vomit (nauseous). Changes in how you see. Very bad chest pain. Feeling dizzy. Seizures. How is this treated? This condition is treated by making healthy lifestyle changes, such as: Eating healthy foods. Exercising more. Drinking less alcohol. Your doctor may prescribe medicine if lifestyle changes do not help enough and if: Your top number is above 130. Your bottom number is above 80. Your personal target blood pressure may vary. Follow these instructions at home: Eating and drinking  If told, follow the DASH eating plan. To follow this plan: Fill one half of your plate at each meal with fruits and vegetables. Fill one fourth of your plate  at each meal with whole grains. Whole grains include whole-wheat pasta, brown rice, and whole-grain bread. Eat or drink low-fat dairy products, such as skim milk or low-fat yogurt. Fill one fourth of your plate at each meal with low-fat (lean) proteins. Low-fat proteins include fish, chicken without skin, eggs, beans, and tofu. Avoid fatty meat, cured and processed meat, or chicken with skin. Avoid pre-made or processed food. Limit the amount of salt in your diet to less than 1,500 mg each day. Do not drink alcohol if: Your doctor tells you not to drink. You are pregnant, may be pregnant, or are planning to become pregnant. If you drink alcohol: Limit how much you have to: 0-1 drink a day for women. 0-2 drinks a day for men. Know how much alcohol is in your drink. In the [place] one drink equals one 12 oz bottle of beer (355 mL), one 5 oz glass of wine (148 mL), or one 1 oz glass of hard liquor (44 mL). Lifestyle  Work with your doctor to stay at a healthy weight or to lose weight. Ask your doctor what the best weight is for you. Get at least 30 minutes of exercise that causes your heart to beat faster (aerobic exercise) most days of the week. This may include walking, swimming, or biking. Get at least 30 minutes of exercise that strengthens your muscles (resistance exercise) at least 3 days a week. This may include lifting weights or doing Pilates. Do not smoke or use any products that contain nicotine or tobacco. If you need help quitting, ask your doctor. Check your blood pressure at home as told by your doctor. Keep all follow-up visits. Medicines Take over-the-counter and prescription medicines  only as told by your doctor. Follow directions carefully. Do not skip doses of blood pressure medicine. The medicine does not work as well if you skip doses. Skipping doses also puts you at risk for problems. Ask your doctor about side effects or reactions to medicines that you should watch  for. Contact a doctor if: You think you are having a reaction to the medicine you are taking. You have headaches that keep coming back. You feel dizzy. You have swelling in your ankles. You have trouble with your vision. Get help right away if: You get a very bad headache. You start to feel mixed up (confused). You feel weak or numb. You feel faint. You have very bad pain in your: Chest. Belly (abdomen). You vomit more than once. You have trouble breathing. These symptoms may be an emergency. Get help right away. Call 911. Do not wait to see if the symptoms will go away. Do not drive yourself to the hospital. Summary Hypertension is another name for high blood pressure. High blood pressure forces your heart to work harder to pump blood. For most people, a normal blood pressure is less than 120/80. Making healthy choices can help lower blood pressure. If your blood pressure does not get lower with healthy choices, you may need to take medicine. This information is not intended to replace advice given to you by your health care provider. Make sure you discuss any questions you have with your health care provider. Document Revised: 12/25/2020 Document Reviewed: 12/25/2020 Elsevier Patient Education  [place].

## 2022-02-23 NOTE — Progress Notes (Signed)
ID:  Leslie Davis, DOB Aug 23, 1955, MRN [id number]  PCP:  [name], MD  Cardiologist:  Rex Kras, DO, [facility] (established care July 09, 2021) Former Cardiology Providers: Dr. Minus Breeding.   Date: 02/23/22 Last Office Visit: 10/08/2021  Chief Complaint  Patient presents with   Coronary Artery Disease   Follow-up    3 month    HPI  Leslie Davis is a 66 y.o. African-American female whose past medical history and cardiovascular risk factors include: CAD, Coronary artery calcification, aortic atherosclerosis, hypertension, hyperlipidemia, GERD, fatty liver disease, IDDM Type II, nephrolithiasis, advanced age.  She was referred to the practice for evaluation of shortness of breath and chest tightness.  She underwent ischemic workup including a coronary CTA which noted disease in the LAD distribution.  She underwent angiography which we illustrated the disease burden; however, she was asymptomatic at the time of angiography due to up titration of antianginal therapy.  Therefore the shared decision between the patient and interventional cardiologist Dr. Virgina Jock was to treat the disease medically unless if it is refractory to medical therapy would consider PCI to the LAD.  Since May 2023 she has done well from a cardiovascular standpoint she denies anginal discomfort or heart failure symptoms.  She presents today for close follow-up.  Since last office visit patient has undergone removal of the left breast papillomas.  Clinically denies anginal discomfort or heart failure symptoms.  Patient states that home blood pressures are better controlled with SBP between 130-150 mmHg on current medical regimen.  No use of sublingual nitroglycerin tablets.  Since last office visit she has been not going to the gym as often due to left knee osteoarthritis for which she is getting physical therapy and due to the left breast surgery that occurred recently.   ALLERGIES: Allergies   Allergen Reactions   Fiasp [Insulin Aspart (W-Niacinamide)] Hives    MEDICATION LIST PRIOR TO VISIT: Current Meds  Medication Sig   amLODipine (NORVASC) 10 MG tablet TAKE ONE TABLET BY MOUTH ONCE DAILY   aspirin EC 81 MG tablet Take 1 tablet (81 mg total) by mouth daily. Swallow whole.   Blood Glucose Monitoring Suppl (ONETOUCH VERIO) w/Device KIT Use as directed to check blood sugars 2 times per day dx: e11.65   carvedilol (COREG) 3.125 MG tablet TAKE ONE TABLET BY MOUTH TWICE DAILY WITH A MEAL   celecoxib (CELEBREX) 200 MG capsule Take 1 capsule (200 mg total) by mouth 2 (two) times daily as needed.   cetirizine (ZYRTEC) 10 MG tablet Take 1 tablet (10 mg total) by mouth daily.   EDARBI 80 MG TABS TAKE ONE TABLET BY MOUTH ONCE DAILY   ezetimibe (ZETIA) 10 MG tablet Take 1 tablet (10 mg total) by mouth daily.   FARXIGA 10 MG TABS tablet TAKE ONE TABLET BY MOUTH ONCE DAILY BEFORE BREAKFAST   ferrous gluconate (FERGON) 324 MG tablet TAKE 1 TABLET BY MOUTH TWICE DAILY WITH A MEAL. (Patient taking differently: Take 324 mg by mouth 2 (two) times daily with a meal.)   glucose blood (ONETOUCH VERIO) test strip USE TO check blood glucose TWICE DAILY AS DIRECTED   linaclotide ([name]) 145 MCG CAPS capsule Take 1 capsule (145 mcg total) by mouth daily. (Patient taking differently: Take 145 mcg by mouth daily as needed (constipation).)   nitroGLYCERIN (NITROSTAT) 0.4 MG SL tablet Place 1 tablet (0.4 mg total) under the tongue every 5 (five) minutes as needed for chest pain. If you require more than two  tablets five minutes apart go to the nearest ER via EMS.   OneTouch Delica Lancets 83F MISC Use as directed to check blood sugars 2 times per day dx:e11.65   oxyCODONE (ROXICODONE) 5 MG immediate release tablet Take 1 tablet (5 mg total) by mouth every 6 (six) hours as needed for severe pain.   pantoprazole (PROTONIX) 40 MG tablet Take 1 tablet (40 mg total) by mouth every morning.   rosuvastatin  (CRESTOR) 20 MG tablet TAKE ONE TABLET BY MOUTH ONCE DAILY   Semaglutide, 2 MG/DOSE, 8 MG/3ML SOPN Inject 2 mg as directed once a week.   temazepam (RESTORIL) 30 MG capsule TAKE ONE CAPSULE BY MOUTH EVERYDAY AT BEDTIME AS NEEDED   traMADol (ULTRAM) 50 MG tablet Take 1 tablet (50 mg total) by mouth every 12 (twelve) hours as needed.   TRUEPLUS PEN NEEDLES 31G X 6 MM MISC USE WITH pen TO INJECT insulin DAILY     PAST MEDICAL HISTORY: Past Medical History:  Diagnosis Date   CKD (chronic kidney disease)    stage 3   Coronary artery disease 07/2021   obstructive CAD-70% LAD-treated medically   DM (diabetes mellitus) ([place])    Fatty liver disease, nonalcoholic    Gastritis    Gastroparesis    GERD (gastroesophageal reflux disease)    Heartburn    Hiatal hernia    HLD (hyperlipidemia)    HTN (hypertension)    Joint pain    Renal calculus    Rheumatoid arthritis ([place])    Vitamin D deficiency     PAST SURGICAL HISTORY: Past Surgical History:  Procedure Laterality Date   BREAST BIOPSY  02/04/2022   MM LT RADIOACTIVE SEED EA ADD LESION LOC MAMMO GUIDE 02/04/2022 [facility]   BREAST BIOPSY  02/04/2022   MM LT RADIOACTIVE SEED LOC MAMMO GUIDE 02/04/2022 [facility]   BREAST LUMPECTOMY WITH RADIOACTIVE SEED LOCALIZATION Left 02/05/2022   Procedure: LEFT BREAST BRACKETED LUMPECTOMY WITH RADIOACTIVE SEED LOCALIZATION;  Surgeon: Jovita Kussmaul, MD;  Location: [place];  Service: General;  Laterality: Left;   CHOLECYSTECTOMY     COLONOSCOPY  09/10/2011   normal   JOINT REPLACEMENT  2022   left TKA   LEFT HEART CATH AND CORONARY ANGIOGRAPHY N/A 08/18/2021   Procedure: LEFT HEART CATH AND CORONARY ANGIOGRAPHY;  Surgeon: Nigel Mormon, MD;  Location: [place] [facility];  Service: Cardiovascular;  Laterality: N/A;   TOTAL KNEE ARTHROPLASTY Left 10/27/2020   Procedure: LEFT TOTAL KNEE ARTHROPLASTY;  Surgeon: [name], MD;  Location: [place];  Service:  Orthopedics;  Laterality: Left;   TUBAL LIGATION     UPPER GASTROINTESTINAL ENDOSCOPY      FAMILY HISTORY: The patient family history includes COPD in her sister; Diabetes in her brother, maternal grandmother, mother, and sister; Heart disease in her sister; Heart disease (age of onset: 45) in her brother; Hyperlipidemia in her father; Hypertension in her father; Kidney disease in her mother and sister; Stomach cancer in her maternal aunt; Stroke in her father; Sudden death in her father; Thyroid disease in her mother.  SOCIAL HISTORY:  The patient  reports that she quit smoking about 47 years ago. Her smoking use included cigarettes. She has a 0.25 pack-year smoking history. She has never used smokeless tobacco. She reports that she does not drink alcohol and does not use drugs.  REVIEW OF SYSTEMS: Review of Systems  Constitutional: Positive for malaise/fatigue.  Cardiovascular:  Negative for chest pain, dyspnea on  exertion, leg swelling, near-syncope, orthopnea, palpitations, paroxysmal nocturnal dyspnea and syncope.  Respiratory:  Negative for shortness of breath.     PHYSICAL EXAM:    02/23/2022   10:08 AM 02/23/2022    9:33 AM 02/05/2022    1:45 PM  Vitals with BMI  Height  _0    Weight  170 lbs 10 oz   BMI  55.97   Systolic 416 384 536  Diastolic 78 69 61  Pulse 60 56 55    Physical Exam  Neck: No JVD present.  Cardiovascular: Normal rate, regular rhythm, S1 normal, S2 normal, intact distal pulses and normal pulses. Exam reveals no gallop, no S3 and no S4.  No murmur heard. Pulses:      Dorsalis pedis pulses are 2+ on the right side and 2+ on the left side.       Posterior tibial pulses are 2+ on the right side and 2+ on the left side.  Pulmonary/Chest: Effort normal and breath sounds normal. No stridor. She has no wheezes. She has no rales. She exhibits no tenderness.  Abdominal: Soft. Bowel sounds are normal. She exhibits no distension. There is no abdominal  tenderness.  Musculoskeletal:        General: No edema.    RADIOLOGY CTA PE protocol: June 25, 2021 1. No pulmonary embolus or acute intrathoracic abnormality. 2. Coronary artery calcifications. Aortic Atherosclerosis (ICD10-I70.0)  CARDIAC DATABASE: EKG: 02/23/2022: Normal sinus rhythm, 64 bpm, consider old anteroseptal infarct, without underlying injury pattern.  Echocardiogram: 07/15/2021:  Normal LV systolic function with EF 63%. Left ventricle cavity is normal  in size. Mild concentric remodeling of the left ventricle. Normal global  wall motion. Normal diastolic filling pattern. Calculated EF 63%.  Left atrial cavity is mildly dilated by volume.  Structurally normal tricuspid valve.  Mild tricuspid regurgitation. No  evidence of pulmonary hypertension.   Stress Testing: 11/03/2018 [facility] available in Care Everywhere: Per report: No reversible ischemia or infarction, normal wall motion, calculated LVEF 84%, low risk study  CCTA: 07/22/2021 1. Total coronary calcium score of 341. This was 93rd percentile for age and sex matched control. 2. Normal coronary origin with co-dominance. 3. CAD-RADS = 3.  Left Main: Patent.   LAD: Mild stenosis (25-49%) at the proximal LAD due to calcified plaque. Moderate stenosis (50-69%) at mid LAD after the take off of second diagonal branch due to mixed eccentric plaque (lesion is about 2 cm in length). Mild stenosis (25-49%) due to calcified plaque within the distal and apical LAD. First Diagonal branch is patent. Second diagonal branch patent with calcified plaque at the mid-segment.   LCx: Patent.   RCA: Moderate stenosis (50-69%) at the ostial RCA due to eccentric mixed plaque and remainder of the vessel is patent.   4. Aortic atherosclerosis.   5. Study is sent for CT-FFR to further evaluate the RCA and LAD disease. Findings will be performed and reported separately.  6. No acute or  significant incidental extracardiac findings in the chest.  CTFFR  May 2023: CT FFR analysis illustrates hemodynamically significant stenosis within the mid LAD (distal to second diagonal branch).  Heart Catheterization: 08/18/2021: LM: Normal LAD: Prox calcific 30% disease         Mid, focal, eccentric 70% stenosis Lcx: No significant disease RCA: Ostial 40% stenosis Patient opted medical therapy. In absence of any angina or angina equivalent symptoms on excellent medical therapy, reasonable to continue the same at that time.  In future, if symptoms increase, could consider PCI to mid LAD.   LABORATORY DATA:    Latest Ref Rng & Units 12/02/2021   10:56 AM 08/11/2021    9:33 AM 06/25/2021    1:50 PM  CBC  WBC 3.4 - 10.8 x10E3/uL 4.7  3.4  4.5   Hemoglobin 11.1 - 15.9 g/dL 13.1  13.5  13.6   Hematocrit 34.0 - 46.6 % 38.7  40.8  40.9   Platelets 150 - 450 x10E3/uL 268  261  245        Latest Ref Rng & Units 02/01/2022   12:39 PM 12/02/2021   10:56 AM 09/16/2021    3:25 PM  CMP  Glucose 70 - 99 mg/dL 90  92  89   BUN 8 - 23 mg/dL _0 Creatinine 0.44 - 1.00 mg/dL 0.99  1.42  1.14   Sodium 135 - 145 mmol/L 140  136  138   Potassium 3.5 - 5.1 mmol/L 4.7  4.7  4.9   Chloride 98 - 111 mmol/L 109  104  104   CO2 22 - 32 mmol/L _1 Calcium 8.9 - 10.3 mg/dL 9.3  9.5  9.2   Total Protein 6.0 - 8.5 g/dL  6.8    Total Bilirubin 0.0 - 1.2 mg/dL  <0.2    Alkaline Phos 44 - 121 IU/L  127    AST 0 - 40 IU/L  19    ALT 0 - 32 IU/L  24      Lipid Panel  Lab Results  Component Value Date   CHOL 159 12/02/2021   HDL 85 12/02/2021   LDLCALC 62 12/02/2021   TRIG 57 12/02/2021   CHOLHDL 1.9 12/02/2021     No components found for: "NTPROBNP" No results for input(s): "PROBNP" in the last 8760 hours. Recent Labs    06/17/21 1606  TSH 1.640    BMP Recent Labs    06/25/21 1350 08/11/21 0933 09/16/21 1525 12/02/21 1056 02/01/22 1239  NA 138   < > 138 136 140   K 4.3   < > 4.9 4.7 4.7  CL 107   < > 104 104 109  CO2 25   < > 21 19* 24  GLUCOSE 93   < > 89 92 90  BUN 34*   < > 24 21 24*  CREATININE 1.16*   < > 1.14* 1.42* 0.99  CALCIUM 9.5   < > 9.2 9.5 9.3  GFRNONAA 52*  --   --   --  >60   < > = values in this interval not displayed.    HEMOGLOBIN A1C Lab Results  Component Value Date   HGBA1C 6.1 (H) 12/02/2021   MPG 134.11 10/22/2020    IMPRESSION:    ICD-10-CM   1. Coronary atherosclerosis due to calcified coronary lesion  I25.10 ezetimibe (ZETIA) 10 MG tablet   I25.84     2. Atherosclerosis of native coronary artery of native heart without angina pectoris  I25.10 EKG 12-Lead    ezetimibe (ZETIA) 10 MG tablet    3. Benign hypertension  I10     4. Atherosclerosis of aorta (HCC)  I70.0     5. Type 2 diabetes mellitus with stage 3a chronic kidney disease, with long-term current use of insulin (HCC)  E11.22    N18.31    Z79.4     6. Type 2 diabetes mellitus with hyperlipidemia (HCC)  E11.69 ezetimibe (ZETIA) 10 MG tablet   E78.5        RECOMMENDATIONS: CARLOTTA TELFAIR is a 66 y.o. African-American female whose past medical history and cardiac risk factors include: CAD, Coronary artery calcification, aortic atherosclerosis, hypertension, hyperlipidemia, GERD, fatty liver disease, IDDM Type II, nephrolithiasis, advanced age.  Atherosclerosis of native coronary artery of native heart without angina pectoris Denies angina pectoris. No sublingual nitroglycerin tablets since last office visit. EKG shows a sinus rhythm without underlying injury pattern. LAD disease has been treated with medical therapy since May 2023. Reemphasized the importance of improving her modifiable cardiovascular risk factors. Continue statin and will add Zetia 10 mg p.o. daily.  Will target an LDL level <55 mg/dL. Encouraged her to increase physical activity as tolerated with a goal of 30 minutes a day 5 days a week.  Coronary atherosclerosis due to  calcified coronary lesion / Atherosclerosis of aorta (HCC) Total CAC 341, 93rd percentile Continue aspirin and statin therapy. Start Zetia 10 mg p.o. daily as noted above Secondary prevention  Benign hypertension Within acceptable range but not at goal. Would recommend a goal SBP around 130 mmHg given her diabetes and CAD. Since she is feeling tired and fatigued we will hold off on up titration of antihypertensive medications. Did not tolerate BiDil and feels fatigued with Toprol-XL 12.5 mg p.o. daily Has tolerated carvedilol well.  Type 2 diabetes mellitus with stage 3a chronic kidney disease, with long-term current use of insulin (HCC) A1c as of September 2023 well-controlled. Currently managed by primary care provider.  Type 2 diabetes mellitus with hyperlipidemia (HCC) Continue rosuvastatin.  Does not endorse myalgias. Most recent lipid profile from September 2023 independently .  FINAL MEDICATION LIST END OF ENCOUNTER: Meds ordered this encounter  Medications   ezetimibe (ZETIA) 10 MG tablet    Sig: Take 1 tablet (10 mg total) by mouth daily.    Dispense:  90 tablet    Refill:  3    Medications Discontinued During This Encounter  Medication Reason   Sennosides (EX-LAX PO)       Current Outpatient Medications:    amLODipine (NORVASC) 10 MG tablet, TAKE ONE TABLET BY MOUTH ONCE DAILY, Disp: 90 tablet, Rfl: 3   aspirin EC 81 MG tablet, Take 1 tablet (81 mg total) by mouth daily. Swallow whole., Disp: 150 tablet, Rfl: 2   Blood Glucose Monitoring Suppl (ONETOUCH VERIO) w/Device KIT, Use as directed to check blood sugars 2 times per day dx: e11.65, Disp: 1 kit, Rfl: 1   carvedilol (COREG) 3.125 MG tablet, TAKE ONE TABLET BY MOUTH TWICE DAILY WITH A MEAL, Disp: 60 tablet, Rfl: 0   celecoxib (CELEBREX) 200 MG capsule, Take 1 capsule (200 mg total) by mouth 2 (two) times daily as needed., Disp: 180 capsule, Rfl: 2   cetirizine (ZYRTEC) 10 MG tablet, Take 1 tablet (10 mg total)  by mouth daily., Disp: 90 tablet, Rfl: 2   EDARBI 80 MG TABS, TAKE ONE TABLET BY MOUTH ONCE DAILY, Disp: 90 tablet, Rfl: 2   ezetimibe (ZETIA) 10 MG tablet, Take 1 tablet (10 mg total) by mouth daily., Disp: 90 tablet, Rfl: 3   FARXIGA 10 MG TABS tablet, TAKE ONE TABLET BY MOUTH ONCE DAILY BEFORE BREAKFAST, Disp: 90 tablet, Rfl: 2   ferrous gluconate (FERGON) 324 MG tablet, TAKE 1 TABLET BY MOUTH TWICE DAILY WITH A MEAL. (Patient taking differently: Take 324 mg by mouth 2 (two) times daily with a meal.), Disp: 100 tablet, Rfl: 0  glucose blood (ONETOUCH VERIO) test strip, USE TO check blood glucose TWICE DAILY AS DIRECTED, Disp: 150 strip, Rfl: 11   linaclotide ([name]) 145 MCG CAPS capsule, Take 1 capsule (145 mcg total) by mouth daily. (Patient taking differently: Take 145 mcg by mouth daily as needed (constipation).), Disp: 30 capsule, Rfl: 0   nitroGLYCERIN (NITROSTAT) 0.4 MG SL tablet, Place 1 tablet (0.4 mg total) under the tongue every 5 (five) minutes as needed for chest pain. If you require more than two tablets five minutes apart go to the nearest ER via EMS., Disp: 30 tablet, Rfl: 0   OneTouch Delica Lancets 03Y MISC, Use as directed to check blood sugars 2 times per day dx:e11.65, Disp: 150 each, Rfl: 3   oxyCODONE (ROXICODONE) 5 MG immediate release tablet, Take 1 tablet (5 mg total) by mouth every 6 (six) hours as needed for severe pain., Disp: 10 tablet, Rfl: 0   pantoprazole (PROTONIX) 40 MG tablet, Take 1 tablet (40 mg total) by mouth every morning., Disp: 90 tablet, Rfl: 2   rosuvastatin (CRESTOR) 20 MG tablet, TAKE ONE TABLET BY MOUTH ONCE DAILY, Disp: 90 tablet, Rfl: 2   Semaglutide, 2 MG/DOSE, 8 MG/3ML SOPN, Inject 2 mg as directed once a week., Disp: 9 mL, Rfl: 0   temazepam (RESTORIL) 30 MG capsule, TAKE ONE CAPSULE BY MOUTH EVERYDAY AT BEDTIME AS NEEDED, Disp: 30 capsule, Rfl: 2   traMADol (ULTRAM) 50 MG tablet, Take 1 tablet (50 mg total) by mouth every 12 (twelve) hours as  needed., Disp: 30 tablet, Rfl: 0   TRUEPLUS PEN NEEDLES 31G X 6 MM MISC, USE WITH pen TO INJECT insulin DAILY, Disp: 150 each, Rfl: 3  Orders Placed This Encounter  Procedures   EKG 12-Lead     There are no Patient Instructions on file for this visit.   --Continue cardiac medications as reconciled in final medication list. --Return in about 6 months (around 08/25/2022) for Follow up, CAD. Or sooner if needed. --Continue follow-up with your primary care physician regarding the management of your other chronic comorbid conditions.  Patient's questions and concerns were addressed to her satisfaction. She voices understanding of the instructions provided during this encounter.   This note was created using a voice recognition software as a result there may be grammatical errors inadvertently enclosed that do not reflect the nature of this encounter. Every attempt is made to correct such errors.  Rex Kras, [place], [facility]  Pager: [phone] Office: [phone]

## 2022-02-24 ENCOUNTER — Encounter: Payer: Self-pay | Admitting: Rehabilitative and Restorative Service Providers"

## 2022-02-24 ENCOUNTER — Ambulatory Visit (INDEPENDENT_AMBULATORY_CARE_PROVIDER_SITE_OTHER): Payer: Medicare Other | Admitting: Rehabilitative and Restorative Service Providers"

## 2022-02-24 DIAGNOSIS — R262 Difficulty in walking, not elsewhere classified: Secondary | ICD-10-CM

## 2022-02-24 DIAGNOSIS — M6281 Muscle weakness (generalized): Secondary | ICD-10-CM | POA: Diagnosis not present

## 2022-02-24 DIAGNOSIS — R600 Localized edema: Secondary | ICD-10-CM | POA: Diagnosis not present

## 2022-02-24 DIAGNOSIS — M25562 Pain in left knee: Secondary | ICD-10-CM | POA: Diagnosis not present

## 2022-02-24 DIAGNOSIS — G8929 Other chronic pain: Secondary | ICD-10-CM

## 2022-02-24 NOTE — Therapy (Signed)
OUTPATIENT PHYSICAL THERAPY TREATMENT   Patient Name: Leslie Davis MRN: [id number] DOB:[birth year], 66 y.o., female Today's Date: 02/24/2022  END OF SESSION:   PT End of Session - 02/24/22 0941     Visit Number 9    Number of Visits 20    Date for PT Re-Evaluation 03/11/22    Authorization Type [employer] Medicare 20% co insurance    Progress Note Due on Visit 10    PT Start Time [phone]    PT Stop Time 1013    PT Time Calculation (min) 39 min    Activity Tolerance Patient tolerated treatment well    Behavior During Therapy WFL for tasks assessed/performed                      Past Medical History:  Diagnosis Date   CKD (chronic kidney disease)    stage 3   Coronary artery disease 07/2021   obstructive CAD-70% LAD-treated medically   DM (diabetes mellitus) ([place])    Fatty liver disease, nonalcoholic    Gastritis    Gastroparesis    GERD (gastroesophageal reflux disease)    Heartburn    Hiatal hernia    HLD (hyperlipidemia)    HTN (hypertension)    Joint pain    Renal calculus    Rheumatoid arthritis ([place])    Vitamin D deficiency    Past Surgical History:  Procedure Laterality Date   BREAST BIOPSY  02/04/2022   MM LT RADIOACTIVE SEED EA ADD LESION LOC MAMMO GUIDE 02/04/2022 [facility]   BREAST BIOPSY  02/04/2022   MM LT RADIOACTIVE SEED LOC MAMMO GUIDE 02/04/2022 [facility]   BREAST LUMPECTOMY WITH RADIOACTIVE SEED LOCALIZATION Left 02/05/2022   Procedure: LEFT BREAST BRACKETED LUMPECTOMY WITH RADIOACTIVE SEED LOCALIZATION;  Surgeon: Jovita Kussmaul, MD;  Location: [facility];  Service: General;  Laterality: Left;   CHOLECYSTECTOMY     COLONOSCOPY  09/10/2011   normal   JOINT REPLACEMENT  2022   left TKA   LEFT HEART CATH AND CORONARY ANGIOGRAPHY N/A 08/18/2021   Procedure: LEFT HEART CATH AND CORONARY ANGIOGRAPHY;  Surgeon: Nigel Mormon, MD;  Location: [facility];  Service: Cardiovascular;  Laterality: N/A;    TOTAL KNEE ARTHROPLASTY Left 10/27/2020   Procedure: LEFT TOTAL KNEE ARTHROPLASTY;  Surgeon: [name], MD;  Location: [place];  Service: Orthopedics;  Laterality: Left;   TUBAL LIGATION     UPPER GASTROINTESTINAL ENDOSCOPY     Patient Active Problem List   Diagnosis Date Noted   Coronary artery disease of native artery of native heart with stable angina pectoris ([place])    Other constipation 05/25/2021   Class 2 severe obesity with serious comorbidity and body mass index (BMI) of 35.0 to 35.9 in adult [facility]) 12/01/2020   Status post total left knee replacement 10/27/2020   Primary osteoarthritis of left knee    NAFLD (nonalcoholic fatty liver disease) 08/06/2020   Other fatigue 07/23/2020   SOBOE (shortness of breath on exertion) 07/23/2020   Diabetes mellitus ([place]) 07/23/2020   Hypertension associated with type 2 diabetes mellitus ([place]) 07/23/2020   Hyperlipidemia associated with type 2 diabetes mellitus ([place]) 07/23/2020   Absolute anemia 07/23/2020   Vitamin D deficiency 07/23/2020   At risk for heart disease 07/23/2020   AKI (acute kidney injury) ([place]) 09/07/2019   Acute kidney injury ([place]) 09/06/2019   Bradycardia 09/06/2019   Hyperglycemia 09/06/2019   HLD (hyperlipidemia)    Hypertensive nephropathy  05/16/2019   Erosive gastritis 11/16/2017   Fatty liver 11/16/2017   Abdominal pain 12/11/2012   Early satiety 12/11/2012   Hiatal hernia    Hx of cholecystectomy 02/05/2011   Flu-like symptoms 02/05/2011   CONSTIPATION 08/27/2008   FATTY LIVER DISEASE 05/07/2008   Nausea with vomiting 04/23/2008   EPIGASTRIC PAIN 04/23/2008   RENAL CALCULUS 04/22/2008   Type 2 diabetes mellitus with stage 2 chronic kidney disease, with long-term current use of insulin ([place]) 05/19/2006   Type 2 diabetes mellitus with hyperlipidemia ([place]) 05/19/2006   HYPERTENSION, BENIGN SYSTEMIC 05/19/2006   GASTROESOPHAGEAL REFLUX, NO ESOPHAGITIS 05/19/2006   AMENORRHEA 05/19/2006    PCP: [name], MD  REFERRING PROVIDER: [name], MD  REFERRING DIAG: [phone] (ICD-10-CM) - Status post total left knee replacement  THERAPY DIAG:  Chronic pain of left knee  Muscle weakness (generalized)  Difficulty in walking, not elsewhere classified  Localized edema  ONSET DATE: 10/27/2020 - TKA, acute on chronic a few weeks  SUBJECTIVE:   SUBJECTIVE STATEMENT: Pt indicated feeling stiffness this morning.  Pt indicated doing ok since last visit.  Pt indicated longer times on feet "bother her".  Reported it happens 1-2 /week.  Usually with over 1 hour activity.   PERTINENT HISTORY: DM, HTN  PAIN:  NPRS scale: upon arrival 0/10, up to 7/10  Pain location: Lt knee Pain description: chronic  Aggravating factors: walking Relieving factors: medicine with very painful  PRECAUTIONS: None  WEIGHT BEARING RESTRICTIONS No  FALLS:  Has patient fallen in last 6 months? 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      Additional Comments 3 steps to enter-has handrail on Rt side     OCCUPATION: Retired  PLOF:  Level of [place] Retired     Leisure works out at gym     PATIENT GOALS Reduce pain, walking    OBJECTIVE:   [place]:  02/02/2022:  FOTO update 59  12/31/2021: FOTO intake: 51  predicted:  71  COGNITION: 12/31/2021 Overall cognitive status: Within functional limits for tasks assessed     PALPATION:  12/31/2021 : Mild tenderness to touch anterior knee joint, inferior patella Lt  LE ROM:  Active ROM Right 12/31/2021 Left 12/31/2021 Left 02/15/2022  Knee flexion  110 AROM supine heel slide c pain anterior knee   Knee extension  -3 in seated LAQ AROM  0 PROM in supine heel prop 0 in seated LAQ   (Blank rows = not tested)  LE MMT:  MMT Right 12/31/2021 Left 12/31/2021 Left 01/18/2022 Left 02/02/2022 Left 02/24/2022  Hip flexion 5/5 5/5     Hip extension       Hip abduction       Hip  adduction       Hip internal rotation       Hip external rotation       Knee flexion 5/5 4/5     Knee extension 5/5 32.9, 31.9 lbs 4/5 23.5, 19.5 lbs c pain 4/5 24.7, 22.1 lbs 5/5 30.1, 29.3 5/5 5/5 30, 29.8 lb  Ankle dorsiflexion 5/5 5/5     Ankle plantarflexion       Ankle inversion       Ankle eversion        (Blank rows = not tested)   FUNCTIONAL TESTS:  02/08/2022: Lt SLS: 7 seconds     Rt SLS:  6 seconds  12/31/2021 TUG: independent 10.64 seconds Lt SLS:  3 seconds   Rt SLS: 5 seconds 18 inch transfer s UE assist: on first try with increased difficulty/pain in mid range Lt knee  GAIT: 12/31/2021 Distance walked: Household distances within clinic  Assistive device utilized: Independent  Comments: Mild maintained knee flexion in stance Lt leg, mild reduction of stance on Lt leg.     TODAY'S TREATMENT: 02/24/2022 Therex: Recumbent bike lvl 2 10.5 mins, seat 5 Leg press  62 lbs x15 , x 15 50 lbs single leg bil c slow lowering focus Knee extension machine Double leg up , Lt single leg down 2 x 10 10 lbs Lateral step down control 2 x 10 bilateral 4 inch step   Neuro Re-ed Tandem ambulation fwd/back on foam in // bars 6 ft x 8 each way occasional hand assist  02/15/2022 Therex: UBE LE only lvl 4.0 6 mins Leg press  125 lbs 2 x 15 double leg for warm up, 62 lbs x15 single leg bil Knee extension machine Double leg up , single leg down 2 x 10 10 lbs , performed bilaterally Step on over and down 4 inch step c slow lowering focus x 15, performed bilaterally     Neuro Re-ed SLS on black mat with contralateral leg corner touching light x 8 each, performed bilaterally Tandem stance on foam occasional hand assist 1 min x 2 bilateral  02/08/2022 Therex: Recumbent bike lvl 2 6 mins, seat 6 Leg press  81 lbs 2 x 15 double leg for warm up, 81 lbs x10 single leg bil Knee extension machine Double leg up , single leg down 2 x 10 10 lbs , performed bilaterally Knee  flexion machine  SL 10 lbs  x 10 bilateral (stopped due to complaints of anterior inferior knee pulling on Lt) Seated AROM LAQ c pauses in end ranges x 10 bilateral  Neuro Re-ed SLS on black mat with contralateral leg corner touching light x 8 each, performed bilaterally SLS c vector reaching light touch contralateral leg fwd, side, and back x 6 each way    PATIENT EDUCATION:  12/31/2021 Education details: HEP, POC Person educated: Patient Education method: Consulting civil engineer, Media planner, Verbal cues, and Handouts Education comprehension: verbalized understanding, returned demonstration, and verbal cues required   HOME EXERCISE PROGRAM: Access Code: [email or web address] URL: [email or web address] Date: 12/31/2021 Prepared by: Scot Jun  Exercises - Supine Quadricep Sets  - 3-5 x daily - 7 x weekly - 1 sets - 10 reps - 5 hold - Supine Heel Slide (Mirrored)  - 3-5 x daily - 7 x weekly - 1 sets - 10 reps - 2 hold - Seated Quad Set (Mirrored)  - 3-5 x daily - 7 x weekly - 1 sets - 10 reps - 5 hold - Seated Long Arc Quad (Mirrored)  - 3-5 x daily - 7 x weekly - 1 sets - 5-10 reps - 2 hold - Seated Straight Leg Heel Taps  - 1-2 x daily - 7 x weekly - 1-2 sets - 5-10 reps - Sit to Stand  - 3 x daily - 7 x weekly - 1 sets - 10 reps - Seated Hamstring Stretch  - 1-2 x daily - 7 x weekly - 1 sets - 3-5 reps - 15 hold  ASSESSMENT:  CLINICAL IMPRESSION: Mild complaints noted in eccentric WB loading activity today requiring adjustment in difficulty and load to avoid pain increased post treatment.  At this time, Pt can continue to benefit from improved Lt leg strength and control to improve WB tolerance  in activity.   OBJECTIVE IMPAIRMENTS Abnormal gait, decreased activity tolerance, decreased balance, decreased coordination, decreased endurance, decreased mobility, difficulty walking, decreased ROM, decreased strength, hypomobility, increased edema, impaired perceived functional ability,  impaired flexibility, improper body mechanics, and pain.   ACTIVITY LIMITATIONS cleaning, community activity, meal prep, laundry, and shopping.   PERSONAL FACTORS  HTN, hyperlipidemia, GERD, DM, RA, length of time since onset  are also affecting patient's functional outcome.    REHAB POTENTIAL: Fair to good  CLINICAL DECISION MAKING: Stable/uncomplicated  EVALUATION COMPLEXITY: Low   GOALS: Goals reviewed with patient? Yes Eval: 07/07/2021 Short term PT Goals (target date for Short term goals are 3 weeks 01/21/2022) Patient will demonstrate independent use of home exercise program to maintain progress from in clinic treatments. Goal status: Met - 01/18/2022   Long term PT goals (target dates for all long term goals are 10 weeks  03/11/2022 )  1. Patient will demonstrate/report pain at worst less than or equal to 2/10 to facilitate minimal limitation in daily activity secondary to pain symptoms. Goal status: on going 02/08/2022  2. Patient will demonstrate independent use of home exercise program to facilitate ability to maintain/progress functional gains from skilled physical therapy services. Goal status: on going 02/08/2022  3. Patient will demonstrate FOTO outcome > or = 71 % to indicate reduced disability due to condition. Goal status: on going 02/08/2022  4.  Patient will demonstrate Lt knee AROM 0-115 degrees s symptoms to facilitate ability to perform transfers, sitting, ambulation, stair navigation s restriction due to mobility. Goal status: on going 02/08/2022  5.  Patient will demonstrate bilateral LE MMT 5/5, dynamometry improvement of > = 10 lbs to facilitate ability to perform usual standing, walking, stairs at PLOF s limitation due to symptoms.   Goal status: Met 02/02/2022  6.  Patient will demonstrate/report ability to ascend/descend stairs c reciprocal gait pattern for household entry.   Goal status: on going 02/08/2022  7.  Patient will demonstrate/report  ability to return to gym workout routine.  a.  Goal Status: on going 02/08/2022   PLAN: PT FREQUENCY: 1-2x/week  PT DURATION: 10 weeks  PLANNED INTERVENTIONS:  Therapeutic exercises, Therapeutic activity, Neuro Muscular re-education, Balance training, Gait training, Patient/Family education, Joint mobilization, Stair training, DME instructions, Dry Needling, Electrical stimulation, Cryotherapy, Moist heat, Taping, Ultrasound, Ionotophoresis 39m/ml Dexamethasone, and Manual therapy.  All included unless contraindicated  PLAN FOR NEXT SESSION:  10th visit progress note, continued treatment indicated vs. HEP    MScot Jun PT, DPT, OCS, ATC 02/24/22  10:11 AM

## 2022-03-03 ENCOUNTER — Ambulatory Visit (INDEPENDENT_AMBULATORY_CARE_PROVIDER_SITE_OTHER): Payer: Medicare Other | Admitting: Physical Therapy

## 2022-03-03 ENCOUNTER — Encounter: Payer: Self-pay | Admitting: Physical Therapy

## 2022-03-03 DIAGNOSIS — R600 Localized edema: Secondary | ICD-10-CM

## 2022-03-03 DIAGNOSIS — M25662 Stiffness of left knee, not elsewhere classified: Secondary | ICD-10-CM | POA: Diagnosis not present

## 2022-03-03 DIAGNOSIS — G8929 Other chronic pain: Secondary | ICD-10-CM

## 2022-03-03 DIAGNOSIS — R262 Difficulty in walking, not elsewhere classified: Secondary | ICD-10-CM | POA: Diagnosis not present

## 2022-03-03 DIAGNOSIS — R2689 Other abnormalities of gait and mobility: Secondary | ICD-10-CM | POA: Diagnosis not present

## 2022-03-03 DIAGNOSIS — M6281 Muscle weakness (generalized): Secondary | ICD-10-CM

## 2022-03-03 DIAGNOSIS — M25562 Pain in left knee: Secondary | ICD-10-CM | POA: Diagnosis not present

## 2022-03-03 NOTE — Therapy (Signed)
OUTPATIENT PHYSICAL THERAPY TREATMENT/PROGRESS NOTE  Progress Note Reporting Period 12/31/21 to 03/03/22  See note below for Objective Data and Assessment of Progress/Goals.       Patient Name: Leslie Davis MRN: [id number] DOB:[birth year], 66 y.o., female Today's Date: 03/03/2022  END OF SESSION:   PT End of Session - 03/03/22 0929     Visit Number 10    Number of Visits 20    Date for PT Re-Evaluation 03/11/22    Authorization Type [employer] Medicare 20% co insurance    Progress Note Due on Visit 20    PT Start Time 0930    PT Stop Time 1010    PT Time Calculation (min) 40 min    Activity Tolerance Patient tolerated treatment well    Behavior During Therapy WFL for tasks assessed/performed                       Past Medical History:  Diagnosis Date   CKD (chronic kidney disease)    stage 3   Coronary artery disease 07/2021   obstructive CAD-70% LAD-treated medically   DM (diabetes mellitus) ([place])    Fatty liver disease, nonalcoholic    Gastritis    Gastroparesis    GERD (gastroesophageal reflux disease)    Heartburn    Hiatal hernia    HLD (hyperlipidemia)    HTN (hypertension)    Joint pain    Renal calculus    Rheumatoid arthritis ([place])    Vitamin D deficiency    Past Surgical History:  Procedure Laterality Date   BREAST BIOPSY  02/04/2022   MM LT RADIOACTIVE SEED EA ADD LESION LOC MAMMO GUIDE 02/04/2022 [facility]   BREAST BIOPSY  02/04/2022   MM LT RADIOACTIVE SEED LOC MAMMO GUIDE 02/04/2022 [facility]   BREAST LUMPECTOMY WITH RADIOACTIVE SEED LOCALIZATION Left 02/05/2022   Procedure: LEFT BREAST BRACKETED LUMPECTOMY WITH RADIOACTIVE SEED LOCALIZATION;  Surgeon: Jovita Kussmaul, MD;  Location: [facility];  Service: General;  Laterality: Left;   CHOLECYSTECTOMY     COLONOSCOPY  09/10/2011   normal   JOINT REPLACEMENT  2022   left TKA   LEFT HEART CATH AND CORONARY ANGIOGRAPHY N/A 08/18/2021   Procedure:  LEFT HEART CATH AND CORONARY ANGIOGRAPHY;  Surgeon: Nigel Mormon, MD;  Location: [facility];  Service: Cardiovascular;  Laterality: N/A;   TOTAL KNEE ARTHROPLASTY Left 10/27/2020   Procedure: LEFT TOTAL KNEE ARTHROPLASTY;  Surgeon: [name], MD;  Location: [facility];  Service: Orthopedics;  Laterality: Left;   TUBAL LIGATION     UPPER GASTROINTESTINAL ENDOSCOPY     Patient Active Problem List   Diagnosis Date Noted   Coronary artery disease of native artery of native heart with stable angina pectoris ([place])    Other constipation 05/25/2021   Class 2 severe obesity with serious comorbidity and body mass index (BMI) of 35.0 to 35.9 in adult [facility]) 12/01/2020   Status post total left knee replacement 10/27/2020   Primary osteoarthritis of left knee    NAFLD (nonalcoholic fatty liver disease) 08/06/2020   Other fatigue 07/23/2020   SOBOE (shortness of breath on exertion) 07/23/2020   Diabetes mellitus ([place]) 07/23/2020   Hypertension associated with type 2 diabetes mellitus ([place]) 07/23/2020   Hyperlipidemia associated with type 2 diabetes mellitus ([place]) 07/23/2020   Absolute anemia 07/23/2020   Vitamin D deficiency 07/23/2020   At risk for heart disease 07/23/2020   AKI (acute kidney injury) ([place])  09/07/2019   Acute kidney injury ([place]) 09/06/2019   Bradycardia 09/06/2019   Hyperglycemia 09/06/2019   HLD (hyperlipidemia)    Hypertensive nephropathy 05/16/2019   Erosive gastritis 11/16/2017   Fatty liver 11/16/2017   Abdominal pain 12/11/2012   Early satiety 12/11/2012   Hiatal hernia    Hx of cholecystectomy 02/05/2011   Flu-like symptoms 02/05/2011   CONSTIPATION 08/27/2008   FATTY LIVER DISEASE 05/07/2008   Nausea with vomiting 04/23/2008   EPIGASTRIC PAIN 04/23/2008   RENAL CALCULUS 04/22/2008   Type 2 diabetes mellitus with stage 2 chronic kidney disease, with long-term current use of insulin ([place]) 05/19/2006   Type 2 diabetes mellitus with hyperlipidemia ([place])  05/19/2006   HYPERTENSION, BENIGN SYSTEMIC 05/19/2006   GASTROESOPHAGEAL REFLUX, NO ESOPHAGITIS 05/19/2006   AMENORRHEA 05/19/2006    PCP: [name], MD  REFERRING PROVIDER: [name], MD  REFERRING DIAG: [phone] (ICD-10-CM) - Status post total left knee replacement  THERAPY DIAG:  Chronic pain of left knee  Muscle weakness (generalized)  Difficulty in walking, not elsewhere classified  Localized edema  Stiffness of left knee, not elsewhere classified  Other abnormalities of gait and mobility  ONSET DATE: 10/27/2020 - TKA, acute on chronic a few weeks  SUBJECTIVE:   SUBJECTIVE STATEMENT: Doing okay; went to the gym Saturday and reports pain and soreness following  PERTINENT HISTORY: DM, HTN  PAIN:  NPRS scale: upon arrival 5/10, up to 9/10  Pain location: Lt knee Pain description: chronic  Aggravating factors: walking Relieving factors: medicine with very painful  PRECAUTIONS: None  WEIGHT BEARING RESTRICTIONS No  FALLS:  Has patient fallen in last 6 months? No  LIVING ENVIRONMENT: Living Environment Private residence      Additional Comments 3 steps to enter-has handrail on Rt side     OCCUPATION: Retired  PLOF:  Level of [place] Retired     Leisure works out at gym     PATIENT GOALS Reduce pain, walking    OBJECTIVE:   PATIENT SURVEYS:  03/03/22: FOTO: 57  02/02/2022:  FOTO update 59  12/31/2021: FOTO intake: 51  predicted:  71  COGNITION: 12/31/2021 Overall cognitive status: Within functional limits for tasks assessed     PALPATION:  12/31/2021 : Mild tenderness to touch anterior knee joint, inferior patella Lt  LE ROM:  Active ROM Right 12/31/2021 Left 12/31/2021 Left 02/15/2022 Left 03/03/22  Knee flexion  110 AROM supine heel slide c pain anterior knee  116 (supine heel slide)  Knee extension  -3 in seated LAQ AROM  0 PROM in supine heel prop 0 in seated LAQ -2 (seated LAQ)   (Blank  rows = not tested)  LE MMT:  MMT Right 12/31/2021 Left 12/31/2021 Left 01/18/2022 Left 02/02/2022 Left 02/24/2022  Hip flexion 5/5 5/5     Hip extension       Hip abduction       Hip adduction       Hip internal rotation       Hip external rotation       Knee flexion 5/5 4/5     Knee extension 5/5 32.9, 31.9 lbs 4/5 23.5, 19.5 lbs c pain 4/5 24.7, 22.1 lbs 5/5 30.1, 29.3 5/5 5/5 30, 29.8 lb  Ankle dorsiflexion 5/5 5/5     Ankle plantarflexion       Ankle inversion       Ankle eversion        (Blank rows = not  tested)   FUNCTIONAL TESTS:  02/08/2022: Lt SLS: 7 seconds     Rt SLS:  6 seconds  12/31/2021 TUG: independent 10.64 seconds Lt SLS:  3 seconds   Rt SLS: 5 seconds 18 inch transfer s UE assist: on first try with increased difficulty/pain in mid range Lt knee  GAIT: 12/31/2021 Distance walked: Household distances within clinic  Assistive device utilized: Independent  Comments: Mild maintained knee flexion in stance Lt leg, mild reduction of stance on Lt leg.     TODAY'S TREATMENT: 03/03/22 Therex: Recumbent bike lvl 3, 10 mins, seat 5 Leg Press 100# bil 2x10; then single leg 50# 2x10 bil ROM measurements see above Knee extension 10# bil concentric; single limb eccentric 3x10 bil Knee flexion 20# 3x10 Lt foot on 4" step with RLE heel taps to floor 2x10  Gait Training Negotiated stairs reciprocally with single handrail mod I  02/24/2022 Therex: Recumbent bike lvl 2 10.5 mins, seat 5 Leg press  62 lbs x15 , x 15 50 lbs single leg bil c slow lowering focus Knee extension machine Double leg up , Lt single leg down 2 x 10 10 lbs Lateral step down control 2 x 10 bilateral 4 inch step   Neuro Re-ed Tandem ambulation fwd/back on foam in // bars 6 ft x 8 each way occasional hand assist  02/15/2022 Therex: UBE LE only lvl 4.0 6 mins Leg press  125 lbs 2 x 15 double leg for warm up, 62 lbs x15 single leg bil Knee extension machine Double leg up ,  single leg down 2 x 10 10 lbs , performed bilaterally Step on over and down 4 inch step c slow lowering focus x 15, performed bilaterally     Neuro Re-ed SLS on black mat with contralateral leg corner touching light x 8 each, performed bilaterally Tandem stance on foam occasional hand assist 1 min x 2 bilateral  PATIENT EDUCATION:  12/31/2021 Education details: HEP, POC Person educated: Patient Education method: Consulting civil engineer, Media planner, Verbal cues, and Handouts Education comprehension: verbalized understanding, returned demonstration, and verbal cues required   HOME EXERCISE PROGRAM: Access Code: [email or web address] URL: [email or web address] Date: 12/31/2021 Prepared by: Scot Jun  Exercises - Supine Quadricep Sets  - 3-5 x daily - 7 x weekly - 1 sets - 10 reps - 5 hold - Supine Heel Slide (Mirrored)  - 3-5 x daily - 7 x weekly - 1 sets - 10 reps - 2 hold - Seated Quad Set (Mirrored)  - 3-5 x daily - 7 x weekly - 1 sets - 10 reps - 5 hold - Seated Long Arc Quad (Mirrored)  - 3-5 x daily - 7 x weekly - 1 sets - 5-10 reps - 2 hold - Seated Straight Leg Heel Taps  - 1-2 x daily - 7 x weekly - 1-2 sets - 5-10 reps - Sit to Stand  - 3 x daily - 7 x weekly - 1 sets - 10 reps - Seated Hamstring Stretch  - 1-2 x daily - 7 x weekly - 1 sets - 3-5 reps - 15 hold  ASSESSMENT:  CLINICAL IMPRESSION: Pt has met 3 LTGs to date with progress towards all LTGs demonstrated.  Feel pt is nearing maximal rehab potential at this time.  Continue to encourage consistent exercise and regular attendance at fitness center, as well as goals of strengthening exercises to expect muscle fatigue and soreness.     OBJECTIVE IMPAIRMENTS Abnormal gait, decreased activity tolerance, decreased balance, decreased coordination, decreased endurance,  decreased mobility, difficulty walking, decreased ROM, decreased strength, hypomobility, increased edema, impaired perceived functional ability, impaired  flexibility, improper body mechanics, and pain.   ACTIVITY LIMITATIONS cleaning, community activity, meal prep, laundry, and shopping.   PERSONAL FACTORS  HTN, hyperlipidemia, GERD, DM, RA, length of time since onset  are also affecting patient's functional outcome.    REHAB POTENTIAL: Fair to good  CLINICAL DECISION MAKING: Stable/uncomplicated  EVALUATION COMPLEXITY: Low   GOALS: Goals reviewed with patient? Yes Eval: 07/07/2021 Short term PT Goals (target date for Short term goals are 3 weeks 01/21/2022) Patient will demonstrate independent use of home exercise program to maintain progress from in clinic treatments. Goal status: Met - 01/18/2022   Long term PT goals (target dates for all long term goals are 10 weeks  03/11/2022 )  1. Patient will demonstrate/report pain at worst less than or equal to 2/10 to facilitate minimal limitation in daily activity secondary to pain symptoms. Goal status: on going 03/03/22  2. Patient will demonstrate independent use of home exercise program to facilitate ability to maintain/progress functional gains from skilled physical therapy services. Goal status: on going 03/03/22  3. Patient will demonstrate FOTO outcome > or = 71 % to indicate reduced disability due to condition. Goal status: on going 03/03/22  4.  Patient will demonstrate Lt knee AROM 0-115 degrees s symptoms to facilitate ability to perform transfers, sitting, ambulation, stair navigation s restriction due to mobility. Goal status: on going 03/03/22 (nearly met)  5.  Patient will demonstrate bilateral LE MMT 5/5, dynamometry improvement of > = 10 lbs to facilitate ability to perform usual standing, walking, stairs at PLOF s limitation due to symptoms.   Goal status: Met 02/02/2022  6.  Patient will demonstrate/report ability to ascend/descend stairs c reciprocal gait pattern for household entry.   Goal status: MET 03/03/22  7.  Patient will demonstrate/report ability to  return to gym workout routine.  a.  Goal Status: MET 03/03/22   PLAN: PT FREQUENCY: 1-2x/week  PT DURATION: 10 weeks  PLANNED INTERVENTIONS:  Therapeutic exercises, Therapeutic activity, Neuro Muscular re-education, Balance training, Gait training, Patient/Family education, Joint mobilization, Stair training, DME instructions, Dry Needling, Electrical stimulation, Cryotherapy, Moist heat, Taping, Ultrasound, Ionotophoresis 28m/ml Dexamethasone, and Manual therapy.  All included unless contraindicated  PLAN FOR NEXT SESSION:  recert v/s d/c next visit    SLaureen Abrahams PT, DPT 03/03/22 10:10 AM

## 2022-03-09 ENCOUNTER — Encounter: Payer: Self-pay | Admitting: Rehabilitative and Restorative Service Providers"

## 2022-03-09 ENCOUNTER — Ambulatory Visit (INDEPENDENT_AMBULATORY_CARE_PROVIDER_SITE_OTHER): Payer: Medicare Other | Admitting: Rehabilitative and Restorative Service Providers"

## 2022-03-09 ENCOUNTER — Ambulatory Visit: Payer: Medicare Other | Admitting: Cardiology

## 2022-03-09 DIAGNOSIS — R262 Difficulty in walking, not elsewhere classified: Secondary | ICD-10-CM

## 2022-03-09 DIAGNOSIS — M6281 Muscle weakness (generalized): Secondary | ICD-10-CM

## 2022-03-09 DIAGNOSIS — M25562 Pain in left knee: Secondary | ICD-10-CM

## 2022-03-09 DIAGNOSIS — G8929 Other chronic pain: Secondary | ICD-10-CM

## 2022-03-09 DIAGNOSIS — R600 Localized edema: Secondary | ICD-10-CM

## 2022-03-09 DIAGNOSIS — E119 Type 2 diabetes mellitus without complications: Secondary | ICD-10-CM | POA: Diagnosis not present

## 2022-03-09 DIAGNOSIS — H25813 Combined forms of age-related cataract, bilateral: Secondary | ICD-10-CM | POA: Diagnosis not present

## 2022-03-09 DIAGNOSIS — H5203 Hypermetropia, bilateral: Secondary | ICD-10-CM | POA: Diagnosis not present

## 2022-03-09 DIAGNOSIS — H52203 Unspecified astigmatism, bilateral: Secondary | ICD-10-CM | POA: Diagnosis not present

## 2022-03-09 LAB — HM DIABETES EYE EXAM

## 2022-03-09 NOTE — Therapy (Addendum)
OUTPATIENT PHYSICAL THERAPY TREATMENT /DISCHARGE    Patient Name: Leslie Davis MRN: [id number] DOB:[birth year], 66 y.o., female Today's Date: 03/09/2022  END OF SESSION:   PT End of Session - 03/09/22 0845     Visit Number 11    Number of Visits 20    Date for PT Re-Evaluation 03/11/22    Authorization Type [employer] Medicare 20% co insurance    Progress Note Due on Visit 71    PT Start Time 0845    PT Stop Time 0925    PT Time Calculation (min) 40 min    Activity Tolerance Patient tolerated treatment well    Behavior During Therapy WFL for tasks assessed/performed              Past Medical History:  Diagnosis Date   CKD (chronic kidney disease)    stage 3   Coronary artery disease 07/2021   obstructive CAD-70% LAD-treated medically   DM (diabetes mellitus) ([place])    Fatty liver disease, nonalcoholic    Gastritis    Gastroparesis    GERD (gastroesophageal reflux disease)    Heartburn    Hiatal hernia    HLD (hyperlipidemia)    HTN (hypertension)    Joint pain    Renal calculus    Rheumatoid arthritis ([place])    Vitamin D deficiency    Past Surgical History:  Procedure Laterality Date   BREAST BIOPSY  02/04/2022   MM LT RADIOACTIVE SEED EA ADD LESION LOC MAMMO GUIDE 02/04/2022 [facility]   BREAST BIOPSY  02/04/2022   MM LT RADIOACTIVE SEED LOC MAMMO GUIDE 02/04/2022 [facility]   BREAST LUMPECTOMY WITH RADIOACTIVE SEED LOCALIZATION Left 02/05/2022   Procedure: LEFT BREAST BRACKETED LUMPECTOMY WITH RADIOACTIVE SEED LOCALIZATION;  Surgeon: Jovita Kussmaul, MD;  Location: [place];  Service: General;  Laterality: Left;   CHOLECYSTECTOMY     COLONOSCOPY  09/10/2011   normal   JOINT REPLACEMENT  2022   left TKA   LEFT HEART CATH AND CORONARY ANGIOGRAPHY N/A 08/18/2021   Procedure: LEFT HEART CATH AND CORONARY ANGIOGRAPHY;  Surgeon: Nigel Mormon, MD;  Location: [facility];  Service: Cardiovascular;  Laterality: N/A;    TOTAL KNEE ARTHROPLASTY Left 10/27/2020   Procedure: LEFT TOTAL KNEE ARTHROPLASTY;  Surgeon: [name], MD;  Location: [facility];  Service: Orthopedics;  Laterality: Left;   TUBAL LIGATION     UPPER GASTROINTESTINAL ENDOSCOPY     Patient Active Problem List   Diagnosis Date Noted   Coronary artery disease of native artery of native heart with stable angina pectoris ([place])    Other constipation 05/25/2021   Class 2 severe obesity with serious comorbidity and body mass index (BMI) of 35.0 to 35.9 in adult [facility]) 12/01/2020   Status post total left knee replacement 10/27/2020   Primary osteoarthritis of left knee    NAFLD (nonalcoholic fatty liver disease) 08/06/2020   Other fatigue 07/23/2020   SOBOE (shortness of breath on exertion) 07/23/2020   Diabetes mellitus ([place]) 07/23/2020   Hypertension associated with type 2 diabetes mellitus ([place]) 07/23/2020   Hyperlipidemia associated with type 2 diabetes mellitus ([place]) 07/23/2020   Absolute anemia 07/23/2020   Vitamin D deficiency 07/23/2020   At risk for heart disease 07/23/2020   AKI (acute kidney injury) ([place]) 09/07/2019   Acute kidney injury ([place]) 09/06/2019   Bradycardia 09/06/2019   Hyperglycemia 09/06/2019   HLD (hyperlipidemia)    Hypertensive nephropathy 05/16/2019   Erosive gastritis 11/16/2017  Fatty liver 11/16/2017   Abdominal pain 12/11/2012   Early satiety 12/11/2012   Hiatal hernia    Hx of cholecystectomy 02/05/2011   Flu-like symptoms 02/05/2011   CONSTIPATION 08/27/2008   FATTY LIVER DISEASE 05/07/2008   Nausea with vomiting 04/23/2008   EPIGASTRIC PAIN 04/23/2008   RENAL CALCULUS 04/22/2008   Type 2 diabetes mellitus with stage 2 chronic kidney disease, with long-term current use of insulin ([place]) 05/19/2006   Type 2 diabetes mellitus with hyperlipidemia ([place]) 05/19/2006   HYPERTENSION, BENIGN SYSTEMIC 05/19/2006   GASTROESOPHAGEAL REFLUX, NO ESOPHAGITIS 05/19/2006   AMENORRHEA 05/19/2006    PCP: [name], MD  REFERRING PROVIDER: [name], MD  REFERRING DIAG: [phone] (ICD-10-CM) - Status post total left knee replacement  THERAPY DIAG:  Chronic pain of left knee  Muscle weakness (generalized)  Difficulty in walking, not elsewhere classified  Localized edema  ONSET DATE: 10/27/2020 - TKA, acute on chronic a few weeks  SUBJECTIVE:   SUBJECTIVE STATEMENT: Pt indicated a little ache here and there.   PERTINENT HISTORY: DM, HTN  PAIN:  NPRS scale: 'a little ache" Pain location: Lt knee Pain description: chronic  Aggravating factors: walking Relieving factors: medicine with very painful  PRECAUTIONS: None  WEIGHT BEARING RESTRICTIONS No  FALLS:  Has patient fallen in last 6 months? No  LIVING ENVIRONMENT: Living Environment Private residence      Additional Comments 3 steps to enter-has handrail on Rt side     OCCUPATION: Retired  PLOF:  Level of [place] Retired     Leisure works out at gym     PATIENT GOALS Reduce pain, walking    OBJECTIVE:   PATIENT SURVEYS:  03/03/22: FOTO: 57  02/02/2022:  FOTO update 59  12/31/2021: FOTO intake: 51  predicted:  71  COGNITION: 12/31/2021 Overall cognitive status: Within functional limits for tasks assessed     PALPATION:  12/31/2021 : Mild tenderness to touch anterior knee joint, inferior patella Lt  LE ROM:  Active ROM Right 12/31/2021 Left 12/31/2021 Left 02/15/2022 Left 03/03/22  Knee flexion  110 AROM supine heel slide c pain anterior knee  116 (supine heel slide)  Knee extension  -3 in seated LAQ AROM  0 PROM in supine heel prop 0 in seated LAQ -2 (seated LAQ)   (Blank rows = not tested)  LE MMT:  MMT Right 12/31/2021 Left 12/31/2021 Left 01/18/2022 Left 02/02/2022 Left 02/24/2022 Left 03/09/2022  Hip flexion 5/5 5/5      Hip extension        Hip abduction        Hip adduction        Hip internal rotation        Hip external rotation        Knee  flexion 5/5 4/5      Knee extension 5/5 32.9, 31.9 lbs 4/5 23.5, 19.5 lbs c pain 4/5 24.7, 22.1 lbs 5/5 30.1, 29.3 5/5 5/5 30, 29.8 lb 5/5 35.6, 36.1 lbs  Ankle dorsiflexion 5/5 5/5      Ankle plantarflexion        Ankle inversion        Ankle eversion         (Blank rows = not tested)   FUNCTIONAL TESTS:  02/08/2022: Lt SLS: 7 seconds     Rt SLS:  6 seconds  12/31/2021 TUG: independent 10.64 seconds Lt SLS:  3 seconds   Rt SLS: 5 seconds 18 inch  transfer s UE assist: on first try with increased difficulty/pain in mid range Lt knee  GAIT: 12/31/2021 Distance walked: Household distances within clinic  Assistive device utilized: Independent  Comments: Mild maintained knee flexion in stance Lt leg, mild reduction of stance on Lt leg.     TODAY'S TREATMENT: 03/09/2022: Therex: Recumbent bike Lvl 2 8.5 mins, seat 6 Machine Leg extension double leg up, Lt leg lowering 2 x 15 5 lbs  Step on over and down WB on Lt leg with 2 handrail assist 6 inch step x 20  Leg press double leg 106 lbs 2 x 10 slow lowering focus.  Single leg 50 lbs 2 x 12 bilateral c slow lowering focus  Neuro Re-ed Tandem stance on foam 1 min x 1 bilateral c occasional hand assist SLS on foam with occasional hand assist 30 sec x 3 bilateral   03/03/22 Therex: Recumbent bike lvl 3, 10 mins, seat 5 Leg Press 100# bil 2x10; then single leg 50# 2x10 bil ROM measurements see above Knee extension 10# bil concentric; single limb eccentric 3x10 bil Knee flexion 20# 3x10 Lt foot on 4" step with RLE heel taps to floor 2x10  Gait Training Negotiated stairs reciprocally with single handrail mod I  02/24/2022 Therex: Recumbent bike lvl 2 10.5 mins, seat 5 Leg press  62 lbs x15 , x 15 50 lbs single leg bil c slow lowering focus Knee extension machine Double leg up , Lt single leg down 2 x 10 10 lbs Lateral step down control 2 x 10 bilateral 4 inch step   Neuro Re-ed Tandem ambulation fwd/back on foam in  // bars 6 ft x 8 each way occasional hand assist    PATIENT EDUCATION:  12/31/2021 Education details: HEP, POC Person educated: Patient Education method: Consulting civil engineer, Media planner, Verbal cues, and Handouts Education comprehension: verbalized understanding, returned demonstration, and verbal cues required   HOME EXERCISE PROGRAM: Access Code: [email or web address] URL: [email or web address] Date: 12/31/2021 Prepared by: Scot Jun  Exercises - Supine Quadricep Sets  - 3-5 x daily - 7 x weekly - 1 sets - 10 reps - 5 hold - Supine Heel Slide (Mirrored)  - 3-5 x daily - 7 x weekly - 1 sets - 10 reps - 2 hold - Seated Quad Set (Mirrored)  - 3-5 x daily - 7 x weekly - 1 sets - 10 reps - 5 hold - Seated Long Arc Quad (Mirrored)  - 3-5 x daily - 7 x weekly - 1 sets - 5-10 reps - 2 hold - Seated Straight Leg Heel Taps  - 1-2 x daily - 7 x weekly - 1-2 sets - 5-10 reps - Sit to Stand  - 3 x daily - 7 x weekly - 1 sets - 10 reps - Seated Hamstring Stretch  - 1-2 x daily - 7 x weekly - 1 sets - 3-5 reps - 15 hold  ASSESSMENT:  CLINICAL IMPRESSION: At this time, Pt was in agreement with recommendation to progress to trial HEP at this time.   See objective data for most recent information.    OBJECTIVE IMPAIRMENTS Abnormal gait, decreased activity tolerance, decreased balance, decreased coordination, decreased endurance, decreased mobility, difficulty walking, decreased ROM, decreased strength, hypomobility, increased edema, impaired perceived functional ability, impaired flexibility, improper body mechanics, and pain.   ACTIVITY LIMITATIONS cleaning, community activity, meal prep, laundry, and shopping.   PERSONAL FACTORS  HTN, hyperlipidemia, GERD, DM, RA, length of time since onset  are also affecting patient's functional outcome.  REHAB POTENTIAL: Fair to good  CLINICAL DECISION MAKING: Stable/uncomplicated  EVALUATION COMPLEXITY: Low   GOALS: Goals reviewed with  patient? Yes Eval: 07/07/2021 Short term PT Goals (target date for Short term goals are 3 weeks 01/21/2022) Patient will demonstrate independent use of home exercise program to maintain progress from in clinic treatments. Goal status: Met - 01/18/2022   Long term PT goals (target dates for all long term goals are 10 weeks  03/11/2022 )  1. Patient will demonstrate/report pain at worst less than or equal to 2/10 to facilitate minimal limitation in daily activity secondary to pain symptoms. Goal status: Partially  met 03/09/2022  2. Patient will demonstrate independent use of home exercise program to facilitate ability to maintain/progress functional gains from skilled physical therapy services. Goal status: Met 03/09/2022  3. Patient will demonstrate FOTO outcome > or = 71 % to indicate reduced disability due to condition. Goal status:partially met 03/09/2022  4.  Patient will demonstrate Lt knee AROM 0-115 degrees s symptoms to facilitate ability to perform transfers, sitting, ambulation, stair navigation s restriction due to mobility. Goal status: Mostly met 03/09/2022  5.  Patient will demonstrate bilateral LE MMT 5/5, dynamometry improvement of > = 10 lbs to facilitate ability to perform usual standing, walking, stairs at PLOF s limitation due to symptoms.   Goal status: Met 02/02/2022  6.  Patient will demonstrate/report ability to ascend/descend stairs c reciprocal gait pattern for household entry.   Goal status: MET 03/03/22  7.  Patient will demonstrate/report ability to return to gym workout routine.  a.  Goal Status: MET 03/03/22   PLAN: PT FREQUENCY: 1-2x/week  PT DURATION: 10 weeks  PLANNED INTERVENTIONS:  Therapeutic exercises, Therapeutic activity, Neuro Muscular re-education, Balance training, Gait training, Patient/Family education, Joint mobilization, Stair training, DME instructions, Dry Needling, Electrical stimulation, Cryotherapy, Moist heat, Taping, Ultrasound,  Ionotophoresis 37m/ml Dexamethasone, and Manual therapy.  All included unless contraindicated  PLAN FOR NEXT SESSION:  Trial HEP.  Recert required upon any return.  Discharge after 30 days inactivity.    MScot Jun PT, DPT, OCS, ATC 03/09/22  9:25 AM   PHYSICAL THERAPY DISCHARGE SUMMARY  Visits from Start of Care: 11  Current functional level related to goals / functional outcomes: See note   Remaining deficits: See note   Education / Equipment: HEP  Patient goals were  mostly met . Patient is being discharged due to not returning since the last visit.  MScot Jun PT, DPT, OCS, ATC 04/15/22  8:31 AM

## 2022-03-10 ENCOUNTER — Ambulatory Visit: Payer: Medicare Other | Admitting: Internal Medicine

## 2022-03-18 ENCOUNTER — Other Ambulatory Visit: Payer: Self-pay | Admitting: Cardiology

## 2022-03-18 ENCOUNTER — Other Ambulatory Visit: Payer: Self-pay | Admitting: Internal Medicine

## 2022-03-18 ENCOUNTER — Telehealth: Payer: Self-pay

## 2022-03-18 DIAGNOSIS — I2510 Atherosclerotic heart disease of native coronary artery without angina pectoris: Secondary | ICD-10-CM

## 2022-03-18 NOTE — Progress Notes (Signed)
Chronic Care Management Pharmacy Assistant   Name: Leslie Davis  MRN: [id number] DOB: 05/08/1955  Reason for Encounter: Medication Review/ Medication Coordination  Recent office visits:  02-23-2022 Azalee Course T, [place]. BP check 108/74  Recent consult visits:  03-09-2022 Girtha Rm, PT (PT). Physical therapy for left knee pain.  03-03-2022 Laureen Abrahams, PT. (PT). Physical therapy for left knee pain.  02-24-2022 Girtha Rm, PT  (PT). Physical therapy for left knee pain.  02-23-2022 Rex Kras, DO (Cardiology). START ezetimibe 10 mg daily. EKG completed.  02-15-2022 Girtha Rm, PT (PT). Physical therapy for left knee pain.  Hospital visits:  None in previous 6 months  Medications: Outpatient Encounter Medications as of 03/18/2022  Medication Sig   amLODipine (NORVASC) 10 MG tablet TAKE ONE TABLET BY MOUTH ONCE DAILY   aspirin EC 81 MG tablet Take 1 tablet (81 mg total) by mouth daily. Swallow whole.   Blood Glucose Monitoring Suppl (ONETOUCH VERIO) w/Device KIT Use as directed to check blood sugars 2 times per day dx: e11.65   carvedilol (COREG) 3.125 MG tablet TAKE ONE TABLET BY MOUTH TWICE DAILY WITH A MEAL   celecoxib (CELEBREX) 200 MG capsule Take 1 capsule (200 mg total) by mouth 2 (two) times daily as needed.   cetirizine (ZYRTEC) 10 MG tablet Take 1 tablet (10 mg total) by mouth daily.   EDARBI 80 MG TABS TAKE ONE TABLET BY MOUTH ONCE DAILY   ezetimibe (ZETIA) 10 MG tablet Take 1 tablet (10 mg total) by mouth daily.   FARXIGA 10 MG TABS tablet TAKE ONE TABLET BY MOUTH ONCE DAILY BEFORE BREAKFAST   ferrous gluconate (FERGON) 324 MG tablet TAKE 1 TABLET BY MOUTH TWICE DAILY WITH A MEAL. (Patient taking differently: Take 324 mg by mouth 2 (two) times daily with a meal.)   glucose blood (ONETOUCH VERIO) test strip USE TO check blood glucose TWICE DAILY AS DIRECTED   linaclotide ([name]) 145 MCG CAPS capsule Take 1 capsule (145 mcg  total) by mouth daily. (Patient taking differently: Take 145 mcg by mouth daily as needed (constipation).)   nitroGLYCERIN (NITROSTAT) 0.4 MG SL tablet Place 1 tablet (0.4 mg total) under the tongue every 5 (five) minutes as needed for chest pain. If you require more than two tablets five minutes apart go to the nearest ER via EMS.   OneTouch Delica Lancets 21Y MISC Use as directed to check blood sugars 2 times per day dx:e11.65   oxyCODONE (ROXICODONE) 5 MG immediate release tablet Take 1 tablet (5 mg total) by mouth every 6 (six) hours as needed for severe pain.   pantoprazole (PROTONIX) 40 MG tablet Take 1 tablet (40 mg total) by mouth every morning.   rosuvastatin (CRESTOR) 20 MG tablet TAKE ONE TABLET BY MOUTH ONCE DAILY   Semaglutide, 2 MG/DOSE, 8 MG/3ML SOPN Inject 2 mg as directed once a week.   temazepam (RESTORIL) 30 MG capsule TAKE ONE CAPSULE BY MOUTH EVERYDAY AT BEDTIME AS NEEDED   traMADol (ULTRAM) 50 MG tablet Take 1 tablet (50 mg total) by mouth every 12 (twelve) hours as needed.   TRUEPLUS PEN NEEDLES 31G X 6 MM MISC USE WITH pen TO INJECT insulin DAILY   No facility-administered encounter medications on file as of 03/18/2022.  Reviewed chart for medication changes ahead of medication coordination call.  No medication changes indicated   BP Readings from Last 3 Encounters:  02/23/22 108/74  02/23/22 (!) 142/78  02/05/22 (!) 175/61  Lab Results  Component Value Date   HGBA1C 6.1 (H) 12/02/2021     Patient obtains medications through Vials  90 Days   Last adherence delivery included:  Chlorthalidone 25 mg 0.5 tablet daily Farxiga 5m 1 tablet daily Edarbi 80 mg- 1 tablet daily  Fergon 324 mg  2 times a day onetouch verio strips twice daily Amlodipine 10 mg daily Rosuvastatin 20 mg daily Pen needles  Isosorbide-hydralazine 20-37.5 mg 3 times daily Pantoprazole 40 mg daily Tramadol 50 mg every 12 hours as needed Metoprolol 25 mg daily Celebrex 200 mg twice  daily Ozempic 2 mg weekly Aspirin 81 mg daily Cetirizine 10 mg daily  Patient declined (meds) last delivery: [facility]- D/C   Patient is due for next adherence delivery on: 03-30-2022  Called patient and reviewed medications and coordinated delivery.  This delivery to include: Chlorthalidone 25 mg 0.5 tablet daily Farxiga 131m1 tablet daily Edarbi 80 mg- 1 tablet daily  Fergon 324 mg  2 times a day onetouch verio strips twice daily Amlodipine 10 mg daily Rosuvastatin 20 mg daily Isosorbide-hydralazine 20-37.5 mg 3 times daily Pantoprazole 40 mg daily Tramadol 50 mg every 12 hours as needed Metoprolol 25 mg daily Celebrex 200 mg twice daily Ozempic 2 mg weekly Aspirin 81 mg daily Cetirizine 10 mg daily  Ozempic and carvedilol to be delivered Friday. No acute form needed per [facility]  Patient declined the following medications: Celebrex- [facility] Ferrous gluconate- [facility]  Patient needs refills for: Sent to specialist by [facility] and I left a VM with specialist Carvedilol  Confirmed delivery date of , advised patient that pharmacy will contact them the morning of delivery.  Care Gaps: Shingrix overdue Covid booster overdue Ophthalmology exam overdue  Star Rating Drugs: Ozempic 2 mg- Last filled 01-12-2022 [facility] Rosuvastatin 20 mg- Last filled 12-28-2021 [facility]. Previous 09-24-2021 [facility] Farxiga 10 mg- Last filled 12-28-2021 [facility]. Previous 09-24-2021 90 [facility] Edarbi 80 mg- Last filled 12-28-2021 [facility]. Previous 09-24-2021 90 [facility]  [facility] Assistant [phone]

## 2022-03-23 ENCOUNTER — Ambulatory Visit: Payer: Self-pay | Admitting: Internal Medicine

## 2022-03-23 ENCOUNTER — Encounter: Payer: Self-pay | Admitting: Internal Medicine

## 2022-03-23 ENCOUNTER — Ambulatory Visit (INDEPENDENT_AMBULATORY_CARE_PROVIDER_SITE_OTHER): Payer: Medicare Other | Admitting: Internal Medicine

## 2022-03-23 VITALS — BP 130/84 | HR 53 | Temp 98.1°F | Ht 63.4 in | Wt 171.4 lb

## 2022-03-23 DIAGNOSIS — Z794 Long term (current) use of insulin: Secondary | ICD-10-CM

## 2022-03-23 DIAGNOSIS — I700 Atherosclerosis of aorta: Secondary | ICD-10-CM | POA: Diagnosis not present

## 2022-03-23 DIAGNOSIS — Z6829 Body mass index (BMI) 29.0-29.9, adult: Secondary | ICD-10-CM

## 2022-03-23 DIAGNOSIS — I25118 Atherosclerotic heart disease of native coronary artery with other forms of angina pectoris: Secondary | ICD-10-CM | POA: Diagnosis not present

## 2022-03-23 DIAGNOSIS — R110 Nausea: Secondary | ICD-10-CM

## 2022-03-23 DIAGNOSIS — I1310 Hypertensive heart and chronic kidney disease without heart failure, with stage 1 through stage 4 chronic kidney disease, or unspecified chronic kidney disease: Secondary | ICD-10-CM | POA: Diagnosis not present

## 2022-03-23 DIAGNOSIS — E1159 Type 2 diabetes mellitus with other circulatory complications: Secondary | ICD-10-CM | POA: Diagnosis not present

## 2022-03-23 DIAGNOSIS — N182 Chronic kidney disease, stage 2 (mild): Secondary | ICD-10-CM | POA: Diagnosis not present

## 2022-03-23 MED ORDER — ONDANSETRON HCL 4 MG PO TABS: 4.0000 mg | ORAL_TABLET | Freq: Three times a day (TID) | ORAL | 0 refills | Status: DC | PRN

## 2022-03-23 NOTE — Progress Notes (Unsigned)
Leslie Davis,acting as a Education administrator for Leslie Greenland, MD.,have documented all relevant documentation on the behalf of Leslie Greenland, MD,as directed by  Leslie Greenland, MD while in the presence of Leslie Greenland, MD.    Subjective:     Patient ID: Leslie Davis , female    DOB: 12-Feb-1956 , 67 y.o.   MRN: [id number]   Chief Complaint  Patient presents with   Diabetes   Hypertension    HPI  Patient is here today for a diabetes/HTN follow up. She denies having any headaches, chest pain and shortness of breath. Patient reports compliance with her meds. She does admit to having some nausea. Denies having heartburn.   Diabetes She presents for her follow-up diabetic visit. She has type 2 diabetes mellitus. Her disease course has been stable. Hypoglycemia symptoms include dizziness. Pertinent negatives for diabetes include no blurred vision, no polydipsia, no polyphagia and no polyuria. There are no hypoglycemic complications. Diabetic complications include nephropathy. Risk factors for coronary artery disease include diabetes mellitus, dyslipidemia, hypertension and post-menopausal. She participates in exercise intermittently. An ACE inhibitor/angiotensin II receptor blocker is being taken. Eye exam is current.  Hypertension This is a chronic problem. The current episode started more than 1 year ago. The problem has been gradually improving since onset. Pertinent negatives include no blurred vision. Risk factors for coronary artery disease include dyslipidemia and diabetes mellitus. The current treatment provides moderate improvement.     Past Medical History:  Diagnosis Date   CKD (chronic kidney disease)    stage 3   Coronary artery disease 07/2021   obstructive CAD-70% LAD-treated medically   DM (diabetes mellitus) ([place])    Fatty liver disease, nonalcoholic    Gastritis    Gastroparesis    GERD (gastroesophageal reflux disease)    Heartburn    Hiatal hernia    HLD  (hyperlipidemia)    HTN (hypertension)    Joint pain    Renal calculus    Rheumatoid arthritis ([place])    Vitamin D deficiency      Family History  Problem Relation Age of Onset   Diabetes Mother    Thyroid disease Mother    Kidney disease Mother    Hypertension Father    Hyperlipidemia Father    Sudden death Father    Stroke Father    Diabetes Sister    Heart disease Sister    Kidney disease Sister    COPD Sister    Heart disease Brother 13   Diabetes Brother    Stomach cancer Maternal Aunt        X 2 aunts   Diabetes Maternal Grandmother    Colon cancer Neg Hx    Esophageal cancer Neg Hx    Rectal cancer Neg Hx    Colon polyps Neg Hx      Current Outpatient Medications:    amLODipine (NORVASC) 10 MG tablet, TAKE ONE TABLET BY MOUTH ONCE DAILY, Disp: 90 tablet, Rfl: 3   aspirin EC 81 MG tablet, Take 1 tablet (81 mg total) by mouth daily. Swallow whole., Disp: 150 tablet, Rfl: 2   Blood Glucose Monitoring Suppl (ONETOUCH VERIO) w/Device KIT, Use as directed to check blood sugars 2 times per day dx: e11.65, Disp: 1 kit, Rfl: 1   carvedilol (COREG) 3.125 MG tablet, TAKE ONE TABLET BY MOUTH TWICE DAILY WITH A MEAL, Disp: 180 tablet, Rfl: 1   celecoxib (CELEBREX) 200 MG capsule, Take 1 capsule (200 mg total) by  mouth 2 (two) times daily as needed., Disp: 180 capsule, Rfl: 2   cetirizine (ZYRTEC) 10 MG tablet, Take 1 tablet (10 mg total) by mouth daily., Disp: 90 tablet, Rfl: 2   EDARBI 80 MG TABS, TAKE ONE TABLET BY MOUTH ONCE DAILY, Disp: 90 tablet, Rfl: 2   ezetimibe (ZETIA) 10 MG tablet, Take 1 tablet (10 mg total) by mouth daily., Disp: 90 tablet, Rfl: 3   FARXIGA 10 MG TABS tablet, TAKE ONE TABLET BY MOUTH ONCE DAILY BEFORE BREAKFAST, Disp: 90 tablet, Rfl: 2   glucose blood (ONETOUCH VERIO) test strip, USE TO check blood glucose TWICE DAILY AS DIRECTED, Disp: 150 strip, Rfl: 11   linaclotide ([name]) 145 MCG CAPS capsule, Take 1 capsule (145 mcg total) by mouth daily.  (Patient taking differently: Take 145 mcg by mouth daily as needed (constipation).), Disp: 30 capsule, Rfl: 0   nitroGLYCERIN (NITROSTAT) 0.4 MG SL tablet, Place 1 tablet (0.4 mg total) under the tongue every 5 (five) minutes as needed for chest pain. If you require more than two tablets five minutes apart go to the nearest ER via EMS., Disp: 30 tablet, Rfl: 0   ondansetron (ZOFRAN) 4 MG tablet, Take 1 tablet (4 mg total) by mouth every 8 (eight) hours as needed for nausea or vomiting., Disp: 20 tablet, Rfl: 0   OneTouch Delica Lancets 85Y MISC, Use as directed to check blood sugars 2 times per day dx:e11.65, Disp: 150 each, Rfl: 3   oxyCODONE (ROXICODONE) 5 MG immediate release tablet, Take 1 tablet (5 mg total) by mouth every 6 (six) hours as needed for severe pain., Disp: 10 tablet, Rfl: 0   pantoprazole (PROTONIX) 40 MG tablet, TAKE ONE TABLET BY MOUTH EVERY MORNING, Disp: 90 tablet, Rfl: 2   rosuvastatin (CRESTOR) 20 MG tablet, TAKE ONE TABLET BY MOUTH ONCE DAILY, Disp: 90 tablet, Rfl: 2   Semaglutide, 2 MG/DOSE, 8 MG/3ML SOPN, Inject 2 mg as directed once a week., Disp: 9 mL, Rfl: 0   temazepam (RESTORIL) 30 MG capsule, TAKE ONE CAPSULE BY MOUTH EVERYDAY AT BEDTIME AS NEEDED, Disp: 30 capsule, Rfl: 2   traMADol (ULTRAM) 50 MG tablet, Take 1 tablet (50 mg total) by mouth every 12 (twelve) hours as needed., Disp: 30 tablet, Rfl: 0   TRUEPLUS PEN NEEDLES 31G X 6 MM MISC, USE WITH pen TO INJECT insulin DAILY, Disp: 150 each, Rfl: 3   ferrous gluconate (FERGON) 324 MG tablet, TAKE 1 TABLET BY MOUTH TWICE DAILY WITH A MEAL. (Patient taking differently: Take 324 mg by mouth 2 (two) times daily with a meal.), Disp: 100 tablet, Rfl: 0   Allergies  Allergen Reactions   Fiasp [Insulin Aspart (W-Niacinamide)] Hives     Review of Systems  Constitutional: Negative.   Eyes: Negative.  Negative for blurred vision.  Respiratory: Negative.    Cardiovascular: Negative.   Gastrointestinal:  Positive for  nausea.       She c/o queasiness. She states her sx started a couple of weeks ago. Denies fever/chills.  Not sure what could have triggered her sx. Denies constipation. She is also on GLP-1 agonist. Denies associated abdominal pain.   Endocrine: Negative for polydipsia, polyphagia and polyuria.  Musculoskeletal: Negative.   Skin: Negative.   Neurological:  Positive for dizziness.  Psychiatric/Behavioral: Negative.       Today's Vitals   03/23/22 1009  BP: 130/84  Pulse: (!) 53  Temp: 98.1 F (36.7 C)  Weight: 171 lb 6.4 oz (77.7 kg)  Height: 5' 3.4" (1.61 m)  PainSc: 0-No pain   Body mass index is 29.98 kg/m.  Wt Readings from Last 3 Encounters:  03/24/22 166 lb (75.3 kg)  03/23/22 171 lb 6.4 oz (77.7 kg)  02/23/22 170 lb (77.1 kg)     Objective:  Physical Exam Vitals and nursing note reviewed.  Constitutional:      Appearance: Normal appearance.  HENT:     Head: Normocephalic and atraumatic.     Nose:     Comments: Masked     Mouth/Throat:     Comments: Masked  Eyes:     Extraocular Movements: Extraocular movements intact.  Cardiovascular:     Rate and Rhythm: Normal rate and regular rhythm.     Heart sounds: Normal heart sounds.  Pulmonary:     Effort: Pulmonary effort is normal.     Breath sounds: Normal breath sounds.  Abdominal:     General: Bowel sounds are normal.     Palpations: Abdomen is soft. There is no mass.     Tenderness: There is no abdominal tenderness. There is no rebound.  Musculoskeletal:     Cervical back: Normal range of motion.  Skin:    General: Skin is warm.  Neurological:     General: No focal deficit present.     Mental Status: She is alert.  Psychiatric:        Mood and Affect: Mood normal.        Behavior: Behavior normal.         Assessment And Plan:     1. Type 2 diabetes mellitus with other circulatory complication, with long-term current use of insulin (HCC) Comments: We discussed that we can likely decrease dose of  her semaglutide to 4m since this could be contributing to her nausea. I will check labs as below. - CBC no Diff - Hemoglobin A1c - Amylase - Lipase - CMP14+EGFR  2. Hypertensive heart and renal disease with renal failure, stage 1 through stage 4 or unspecified chronic kidney disease, without heart failure Comments: Chronic, controlled.  She will c/w Edarbi 863m amlodipine 1073mnd carvedilol bid.  3. Coronary artery disease of native artery of native heart with stable angina pectoris (HCCBusseyomments: Chronic, encouraged to follow heart healthy lifestyle.  4. Chronic renal disease, stage II Comments: Chronic, reminded to avoid NSAIDs, stay well hydrated and keep BP/BS controlled to decrease risk of progression.  5. Atherosclerosis of aorta (HCC) Comments: Chronic, LDL goal <70. She is encouraged to c/w ASA and statin therapy.  6. Nausea Comments: She does have h/o GERD. Semaglutide can worsen these sx, she is advised to skip dosing of semaglutide this week. I will check amylase/lipase levels. - CBC no Diff - Amylase - Lipase - CMP14+EGFR  7. BMI 29.0-29.9,adult Comments: BMI is acceptable for her demographic. Encouraged to aim for at least 150 minutes of exercise per week.   Patient was given opportunity to ask questions. Patient verbalized understanding of the plan and was able to repeat key elements of the plan. All questions were answered to their satisfaction.   I, RobMaximino GreenlandD, have reviewed all documentation for this visit. The documentation on 03/23/22 for the exam, diagnosis, procedures, and orders are all accurate and complete.   IF YOU HAVE BEEN REFERRED TO A SPECIALIST, IT MAY TAKE 1-2 WEEKS TO SCHEDULE/PROCESS THE REFERRAL. IF YOU HAVE NOT HEARD FROM US/SPECIALIST IN TWO WEEKS, PLEASE GIVE US [place]CALL AT [phone].   THE PATIENT  IS ENCOURAGED TO PRACTICE SOCIAL DISTANCING DUE TO THE COVID-19 PANDEMIC.

## 2022-03-23 NOTE — Patient Instructions (Signed)

## 2022-03-24 ENCOUNTER — Encounter (INDEPENDENT_AMBULATORY_CARE_PROVIDER_SITE_OTHER): Payer: Self-pay | Admitting: Family Medicine

## 2022-03-24 ENCOUNTER — Ambulatory Visit (INDEPENDENT_AMBULATORY_CARE_PROVIDER_SITE_OTHER): Payer: Medicare Other | Admitting: Family Medicine

## 2022-03-24 VITALS — BP 149/61 | HR 52 | Temp 98.0°F | Ht 63.0 in | Wt 166.0 lb

## 2022-03-24 DIAGNOSIS — E66811 Obesity, class 1: Secondary | ICD-10-CM

## 2022-03-24 DIAGNOSIS — I152 Hypertension secondary to endocrine disorders: Secondary | ICD-10-CM | POA: Diagnosis not present

## 2022-03-24 DIAGNOSIS — K5909 Other constipation: Secondary | ICD-10-CM

## 2022-03-24 DIAGNOSIS — Z7985 Long-term (current) use of injectable non-insulin antidiabetic drugs: Secondary | ICD-10-CM | POA: Diagnosis not present

## 2022-03-24 DIAGNOSIS — E1159 Type 2 diabetes mellitus with other circulatory complications: Secondary | ICD-10-CM

## 2022-03-24 DIAGNOSIS — Z6829 Body mass index (BMI) 29.0-29.9, adult: Secondary | ICD-10-CM

## 2022-03-24 DIAGNOSIS — E1169 Type 2 diabetes mellitus with other specified complication: Secondary | ICD-10-CM | POA: Diagnosis not present

## 2022-03-24 DIAGNOSIS — E669 Obesity, unspecified: Secondary | ICD-10-CM | POA: Insufficient documentation

## 2022-03-24 LAB — CBC
Hematocrit: 39.7 % (ref 34.0–46.6)
Hemoglobin: 13.2 g/dL (ref 11.1–15.9)
MCH: 28.8 pg (ref 26.6–33.0)
MCHC: 33.2 g/dL (ref 31.5–35.7)
MCV: 87 fL (ref 79–97)
Platelets: 211 10*3/uL (ref 150–450)
RBC: 4.59 x10E6/uL (ref 3.77–5.28)
RDW: 13.1 % (ref 11.7–15.4)
WBC: 4.4 10*3/uL (ref 3.4–10.8)

## 2022-03-24 LAB — CMP14+EGFR
ALT: 54 IU/L — ABNORMAL HIGH (ref 0–32)
AST: 40 IU/L (ref 0–40)
Albumin/Globulin Ratio: 1.5 (ref 1.2–2.2)
Albumin: 4.2 g/dL (ref 3.9–4.9)
Alkaline Phosphatase: 114 IU/L (ref 44–121)
BUN/Creatinine Ratio: 19 (ref 12–28)
BUN: 19 mg/dL (ref 8–27)
Bilirubin Total: 0.3 mg/dL (ref 0.0–1.2)
CO2: 22 mmol/L (ref 20–29)
Calcium: 9.9 mg/dL (ref 8.7–10.3)
Chloride: 104 mmol/L (ref 96–106)
Creatinine, Ser: 0.98 mg/dL (ref 0.57–1.00)
Globulin, Total: 2.8 g/dL (ref 1.5–4.5)
Glucose: 94 mg/dL (ref 70–99)
Potassium: 4.7 mmol/L (ref 3.5–5.2)
Sodium: 139 mmol/L (ref 134–144)
Total Protein: 7 g/dL (ref 6.0–8.5)
eGFR: 63 mL/min/{1.73_m2} (ref >59)

## 2022-03-24 LAB — AMYLASE: Amylase: 67 U/L (ref 31–110)

## 2022-03-24 LAB — HEMOGLOBIN A1C
Est. average glucose Bld gHb Est-mCnc: 134 mg/dL
Hgb A1c MFr Bld: 6.3 % — ABNORMAL HIGH (ref 4.8–5.6)

## 2022-03-24 LAB — LIPASE: Lipase: 54 U/L (ref 14–72)

## 2022-03-26 ENCOUNTER — Encounter: Payer: Self-pay | Admitting: Internal Medicine

## 2022-03-29 ENCOUNTER — Other Ambulatory Visit: Payer: Self-pay | Admitting: Cardiology

## 2022-03-29 DIAGNOSIS — R072 Precordial pain: Secondary | ICD-10-CM

## 2022-04-01 ENCOUNTER — Telehealth: Payer: Self-pay

## 2022-04-01 NOTE — Progress Notes (Cosign Needed)
04-01-2022: Rescheduled 04-14-2022 appointment with [name] CPP to February.  [place] Pharmacist Assistant [phone]

## 2022-04-02 NOTE — Progress Notes (Unsigned)
04/05/2022 [name] [id number] February 21, 1956  Referring provider: [name], MD Primary GI doctor: Dr. Rush Landmark  ASSESSMENT AND PLAN:   67 year old female with history of type 2 diabetes, GERD presents with nausea without vomiting, early satiety and epigastric pain, history of cholecystectomy 2019 EGD with chronic gastritis negative H. pylori Patient was on Ozempic has stopped for 2 weeks without significant improvement Patient has been on Celebrex twice daily, has not had in a month but has also not been taking her Protonix. Recent CT angio without obstructing plaque No weight loss, dysphagia or alarming symptoms. Possible gastritis, peptic ulcer disease from recent NSAID use, possible from [place] which she is currently stopped, possible gastroparesis from longstanding diabetes. -Stay off Ozempic/GLP for the time being -Avoid NSAIDs -Get back on Protonix 40 mg once daily -Take Zofran as needed -Given gastroparesis diet -Will schedule EGD to evaluate for possible H. pylori, esophagitis, gastritis, peptic ulcer disease, etc..I discussed risks of EGD with patient today, including risk of sedation, bleeding or perforation. Patient provides understanding and gave verbal consent to proceed. -If EGD negative consider cross-sectional imaging with CT abdomen pelvis with contrast versus GES  Type 2 diabetes mellitus with stage 2 chronic kidney disease, with long-term current use of insulin (HCC) Last A1c 6.3 Was on Ozempic for the last year, has been off for 2 weeks without significant improvement in her symptoms.  Chronic idiopathic constipation Continue [name], tolerating well.  Personal history of colon polyps 11/24/2021 colonoscopy for screening, good bowel prep, grade 2 hemorrhoids normal TI, 6 polyps 2 to 8 mm rectosigmoid colon, transverse, descending and cecum, diverticulosis Pathology showed TA and sessile polyps, recall 3 years  Patient Care Team: [name], MD as PCP - General (Internal Medicine) Minus Breeding, MD as PCP - Cardiology (Cardiology) Mayford Knife, [facility] (Pharmacist)  HISTORY OF PRESENT ILLNESS: 67 y.o. female with a past medical history of erosive gastritis, hiatal hernia, CIC, fatty liver, hypertension, hyperlipidemia, type 2 diabetes with CKD stage III, nonobstructive CAD 07/2021 and others listed below presents for evaluation of vomiting. S/p cholecystectomy    10/2017 EGD with biopsies chronic gastritis negative H. pylori, mild reflux changes in the esophagus 12/23/2020 office visit Tye [facility] for abdominal pain and diarrhea 11/24/2021 colonoscopy for screening, good bowel prep, grade 2 hemorrhoids normal TI, 6 polyps 2 to 8 mm rectosigmoid colon, transverse, descending and cecum, diverticulosis Pathology showed TA and sessile polyps, recall 3 years 02/05/2022 left breast lumpectomy with radioactive seed localization for left breast papillomas with Dr. Marlou Starks 03/23/2022 office visit with PCP for nausea, decreased Ozempic dose from 2 mg to 1 mg, has not taken in 2 weeks. Has been on it for a year.  Patient is on [place] for [place] from that visit show unremarkable lipase, amylase, no anemia or leukocytosis A1c 6.3 06/17/21 normal TSH  She has had at least 2 months of nausea, denies vomiting.  Will happen daily or twice a day, will last about 20 mins to an hour.  Will eat crackers and gingerale, occ burping/beltching but won't help.  She will feels faint or weakness with the nausea, will have to sit down.  Not worse with food, not worse with exertion.  She had RUQ and epigastric AB pain intermittently, once or twice a month.  She is on protonix 40 AS needed, admits to not taking it often as she feels it can cause her to feel worse. She has had some GERD last 1-2 weeks worse than  normal, Has had dark/black stool with iron pills, soft stools, not tarry.  She  denies AB bloating. She does have early satiety which she  associates with ozempic.  No unintentional weight loss She does not take the tramadol or oxycodone often, she takes the celebrex daily until a month ago she stopped.  She denies ETOH use.  No drug use.  She has not tried the zofran, got it Friday from pharmacy.    Wt Readings from Last 8 Encounters:  04/05/22 174 lb 8 oz (79.2 kg)  03/24/22 166 lb (75.3 kg)  03/23/22 171 lb 6.4 oz (77.7 kg)  02/23/22 170 lb (77.1 kg)  02/23/22 170 lb 9.6 oz (77.4 kg)  02/05/22 171 lb 11.8 oz (77.9 kg)  12/09/21 163 lb (73.9 kg)  12/02/21 165 lb 12.8 oz (75.2 kg)     She  reports that she quit smoking about 47 years ago. Her smoking use included cigarettes. She has a 0.25 pack-year smoking history. She has never used smokeless tobacco. She reports that she does not drink alcohol and does not use drugs.  Current Medications:   Current Outpatient Medications (Endocrine & Metabolic):    FARXIGA 10 MG TABS tablet, TAKE ONE TABLET BY MOUTH ONCE DAILY BEFORE BREAKFAST   Semaglutide, 2 MG/DOSE, 8 MG/3ML SOPN, Inject 2 mg as directed once a week.  Current Outpatient Medications (Cardiovascular):    amLODipine (NORVASC) 10 MG tablet, TAKE ONE TABLET BY MOUTH ONCE DAILY   carvedilol (COREG) 3.125 MG tablet, TAKE ONE TABLET BY MOUTH TWICE DAILY WITH A MEAL   EDARBI 80 MG TABS, TAKE ONE TABLET BY MOUTH ONCE DAILY   ezetimibe (ZETIA) 10 MG tablet, Take 1 tablet (10 mg total) by mouth daily.   rosuvastatin (CRESTOR) 20 MG tablet, TAKE ONE TABLET BY MOUTH ONCE DAILY   metoprolol tartrate (LOPRESSOR) 25 MG tablet, TAKE ONE TABLET BY MOUTH twice daily FOR 7 DAYS, start TWO DAYS prior TO your CT scan (Patient not taking: Reported on 04/05/2022)   nitroGLYCERIN (NITROSTAT) 0.4 MG SL tablet, Place 1 tablet (0.4 mg total) under the tongue every 5 (five) minutes as needed for chest pain. If you require more than two tablets five minutes apart go to the nearest ER via EMS.  Current Outpatient Medications (Respiratory):     cetirizine (ZYRTEC) 10 MG tablet, Take 1 tablet (10 mg total) by mouth daily.  Current Outpatient Medications (Analgesics):    aspirin EC 81 MG tablet, Take 1 tablet (81 mg total) by mouth daily. Swallow whole.   celecoxib (CELEBREX) 200 MG capsule, Take 1 capsule (200 mg total) by mouth 2 (two) times daily as needed.   oxyCODONE (ROXICODONE) 5 MG immediate release tablet, Take 1 tablet (5 mg total) by mouth every 6 (six) hours as needed for severe pain.   traMADol (ULTRAM) 50 MG tablet, Take 1 tablet (50 mg total) by mouth every 12 (twelve) hours as needed.  Current Outpatient Medications (Hematological):    ferrous gluconate (FERGON) 324 MG tablet, TAKE 1 TABLET BY MOUTH TWICE DAILY WITH A MEAL. (Patient taking differently: Take 324 mg by mouth 2 (two) times daily with a meal.)  Current Outpatient Medications (Other):    Blood Glucose Monitoring Suppl (ONETOUCH VERIO) w/Device KIT, Use as directed to check blood sugars 2 times per day dx: e11.65   glucose blood (ONETOUCH VERIO) test strip, USE TO check blood glucose TWICE DAILY AS DIRECTED   linaclotide ([name]) 145 MCG CAPS capsule, Take 1 capsule (145 mcg  total) by mouth daily. (Patient taking differently: Take 145 mcg by mouth daily as needed (constipation).)   ondansetron (ZOFRAN) 4 MG tablet, Take 1 tablet (4 mg total) by mouth every 8 (eight) hours as needed for nausea or vomiting.   OneTouch Delica Lancets 12W MISC, Use as directed to check blood sugars 2 times per day dx:e11.65   pantoprazole (PROTONIX) 40 MG tablet, TAKE ONE TABLET BY MOUTH EVERY MORNING   temazepam (RESTORIL) 30 MG capsule, TAKE ONE CAPSULE BY MOUTH EVERYDAY AT BEDTIME AS NEEDED   TRUEPLUS PEN NEEDLES 31G X 6 MM MISC, USE WITH pen TO INJECT insulin DAILY  Medical History:  Past Medical History:  Diagnosis Date   CKD (chronic kidney disease)    stage 3   Coronary artery disease 07/2021   obstructive CAD-70% LAD-treated medically   DM (diabetes mellitus) ([place])     Fatty liver disease, nonalcoholic    Gastritis    Gastroparesis    GERD (gastroesophageal reflux disease)    Heartburn    Hiatal hernia    HLD (hyperlipidemia)    HTN (hypertension)    Joint pain    Renal calculus    Rheumatoid arthritis (HCC)    Vitamin D deficiency    Allergies:  Allergies  Allergen Reactions   Fiasp [Insulin Aspart (W-Niacinamide)] Hives     Surgical History:  She  has a past surgical history that includes Cholecystectomy; Tubal ligation; Colonoscopy (09/10/2011); Upper gastrointestinal endoscopy; Total knee arthroplasty (Left, 10/27/2020); LEFT HEART CATH AND CORONARY ANGIOGRAPHY (N/A, 08/18/2021); Joint replacement (2022); Breast biopsy (02/04/2022); Breast biopsy (02/04/2022); and Breast lumpectomy with radioactive seed localization (Left, 02/05/2022). Family History:  Her family history includes COPD in her sister; Diabetes in her brother, maternal grandmother, mother, and sister; Heart disease in her sister; Heart disease (age of onset: 63) in her brother; Hyperlipidemia in her father; Hypertension in her father; Kidney disease in her mother and sister; Stomach cancer in her maternal aunt; Stroke in her father; Sudden death in her father; Thyroid disease in her mother.  REVIEW OF SYSTEMS  : All other systems reviewed and negative except where noted in the History of Present Illness.  PHYSICAL EXAM: BP (!) 140/70   Pulse (!) 53   Ht '5\' 4"'$  (1.626 m)   Wt 174 lb 8 oz (79.2 kg)   BMI 29.95 kg/m  General:   Pleasant, well developed female in no acute distress Head:   Normocephalic and atraumatic. Eyes:  sclerae anicteric,conjunctive pink  Heart:   regular rate and rhythm, no murmurs or gallops Pulm:  Clear anteriorly; no wheezing Abdomen:   Soft, Obese AB, Active bowel sounds. mild tenderness in the epigastrium. Without guarding and Without rebound, No organomegaly appreciated. Rectal: Not evaluated Extremities:  Without edema. Msk: Symmetrical without  gross deformities. Peripheral pulses intact.  Neurologic:  Alert and  oriented x4;  No focal deficits.  Skin:   Dry and intact without significant lesions or rashes. Psychiatric:  Cooperative. Normal mood and affect.  RELEVANT LABS AND IMAGING: CBC    Component Value Date/Time   WBC 4.4 03/23/2022 1032   WBC 4.5 06/25/2021 1350   RBC 4.59 03/23/2022 1032   RBC 4.69 06/25/2021 1350   HGB 13.2 03/23/2022 1032   HCT 39.7 03/23/2022 1032   PLT 211 03/23/2022 1032   MCV 87 03/23/2022 1032   MCH 28.8 03/23/2022 1032   MCH 29.0 06/25/2021 1350   MCHC 33.2 03/23/2022 1032   MCHC 33.3 06/25/2021 1350  RDW 13.1 03/23/2022 1032   LYMPHSABS 1.8 06/25/2021 1350   LYMPHSABS 1.8 07/23/2020 1042   MONOABS 0.6 06/25/2021 1350   EOSABS 0.1 06/25/2021 1350   EOSABS 0.2 07/23/2020 1042   BASOSABS 0.0 06/25/2021 1350   BASOSABS 0.0 07/23/2020 1042    CMP     Component Value Date/Time   NA 139 03/23/2022 1032   K 4.7 03/23/2022 1032   CL 104 03/23/2022 1032   CO2 22 03/23/2022 1032   GLUCOSE 94 03/23/2022 1032   GLUCOSE 90 02/01/2022 1239   BUN 19 03/23/2022 1032   CREATININE 0.98 03/23/2022 1032   CREATININE 0.74 08/30/2011 1307   CALCIUM 9.9 03/23/2022 1032   PROT 7.0 03/23/2022 1032   ALBUMIN 4.2 03/23/2022 1032   AST 40 03/23/2022 1032   ALT 54 (H) 03/23/2022 1032   ALKPHOS 114 03/23/2022 1032   BILITOT 0.3 03/23/2022 1032   GFRNONAA >60 02/01/2022 1239   GFRAA 57 (L) 02/21/2020 1500     Vladimir Crofts, PA-C 10:33 AM

## 2022-04-05 ENCOUNTER — Encounter: Payer: Self-pay | Admitting: Physician Assistant

## 2022-04-05 ENCOUNTER — Other Ambulatory Visit: Payer: Self-pay

## 2022-04-05 ENCOUNTER — Ambulatory Visit (INDEPENDENT_AMBULATORY_CARE_PROVIDER_SITE_OTHER): Payer: 59 | Admitting: Physician Assistant

## 2022-04-05 VITALS — BP 140/70 | HR 53 | Ht 64.0 in | Wt 174.5 lb

## 2022-04-05 DIAGNOSIS — Z9049 Acquired absence of other specified parts of digestive tract: Secondary | ICD-10-CM | POA: Diagnosis not present

## 2022-04-05 DIAGNOSIS — K5904 Chronic idiopathic constipation: Secondary | ICD-10-CM | POA: Diagnosis not present

## 2022-04-05 DIAGNOSIS — R1013 Epigastric pain: Secondary | ICD-10-CM | POA: Diagnosis not present

## 2022-04-05 DIAGNOSIS — Z794 Long term (current) use of insulin: Secondary | ICD-10-CM

## 2022-04-05 DIAGNOSIS — R110 Nausea: Secondary | ICD-10-CM

## 2022-04-05 DIAGNOSIS — R6881 Early satiety: Secondary | ICD-10-CM

## 2022-04-05 DIAGNOSIS — K219 Gastro-esophageal reflux disease without esophagitis: Secondary | ICD-10-CM | POA: Diagnosis not present

## 2022-04-05 DIAGNOSIS — R112 Nausea with vomiting, unspecified: Secondary | ICD-10-CM

## 2022-04-05 DIAGNOSIS — E1122 Type 2 diabetes mellitus with diabetic chronic kidney disease: Secondary | ICD-10-CM | POA: Diagnosis not present

## 2022-04-05 DIAGNOSIS — N182 Chronic kidney disease, stage 2 (mild): Secondary | ICD-10-CM | POA: Diagnosis not present

## 2022-04-05 NOTE — Progress Notes (Signed)
Attending Physician's Attestation   I have reviewed the chart.   I agree with the Advanced Practitioner's note, impression, and recommendations with any updates as below. If issues persist after endoscopy then solid food gastric emptying study and CT scan makes sense.   Justice Britain, MD [facility] Advanced Endoscopy Office # [phone]

## 2022-04-05 NOTE — Patient Instructions (Addendum)
You have been scheduled for an endoscopy. Please follow written instructions given to you at your visit today. If you use inhalers (even only as needed), please bring them with you on the day of your procedure.   If your blood pressure at your visit was 140/90 or greater, please contact your primary care physician to follow up on this.   If you are age 68 or older, your body mass index should be between 23-30. Your Body mass index is 29.95 kg/m. If this is out of the aforementioned range listed, please consider follow up with your Primary Care Provider.  If you are age 44 or younger, your body mass index should be between 19-25. Your Body mass index is 29.95 kg/m. If this is out of the aformentioned range listed, please consider follow up with your Primary Care Provider.    The [facility] providers would like to encourage you to use [facility] to communicate with providers for non-urgent requests or questions.  Due to long hold times on the telephone, sending your provider a message by [facility] may be a faster and more efficient way to get a response.  Please allow 48 business hours for a response.  Please remember that this is for non-urgent requests.   Due to recent changes in healthcare laws, you may see the results of your imaging and laboratory studies on MyChart before your provider has had a chance to review them.  We understand that in some cases there may be results that are confusing or concerning to you. Not all laboratory results come back in the same time frame and the provider may be waiting for multiple results in order to interpret others.  Please give [place] 48 hours in order for your provider to thoroughly review all the results before contacting the office for clarification of your results.    Please take your proton pump inhibitor medication, pantoprazole daily  Please take this medication 30 minutes to 1 hour before meals- this makes it more effective.  Avoid spicy and acidic  foods Avoid fatty foods Limit your intake of coffee, tea, alcohol, and carbonated drinks Work to maintain a healthy weight Keep the head of the bed elevated at least 3 inches with blocks or a wedge pillow if you are having any nighttime symptoms Stay upright for 2 hours after eating Avoid meals and snacks three to four hours before bedtime   Can take the zofran/ondansetron take twice a day for at least 2-3 days, then as needed.   Gastroparesis Please do small frequent meals like 4-6 meals a day.  Eat and drink liquids at separate times.  Avoid high fiber foods, cook your vegetables, avoid high fat food.  Suggest spreading protein throughout the day (greek yogurt, glucerna, soft meat, milk, eggs) Choose soft foods that you can mash with a fork When you are more symptomatic, change to pureed foods foods and liquids.  Consider reading "Living well with Gastroparesis" by Lambert Keto Gastroparesis is a condition in which food takes longer than normal to empty from the stomach. This condition is also known as delayed gastric emptying. It is usually a long-term (chronic) condition. There is no cure, but there are treatments and things that you can do at home to help relieve symptoms. Treating the underlying condition that causes gastroparesis can also help relieve symptoms What are the causes? In many cases, the cause of this condition is not known. Possible causes include: A hormone (endocrine) disorder, such as hypothyroidism or diabetes. A  nervous system disease, such as Parkinson's disease or multiple sclerosis. Cancer, infection, or surgery that affects the stomach or vagus nerve. The vagus nerve runs from your chest, through your neck, and to the lower part of your brain. A connective tissue disorder, such as scleroderma. Certain medicines. What increases the risk? You are more likely to develop this condition if: You have certain disorders or diseases. These may include: An  endocrine disorder. An eating disorder. Amyloidosis. Scleroderma. Parkinson's disease. Multiple sclerosis. Cancer or infection of the stomach or the vagus nerve. You have had surgery on your stomach or vagus nerve. You take certain medicines. You are female. What are the signs or symptoms? Symptoms of this condition include: Feeling full after eating very little or a loss of appetite. Nausea, vomiting, or heartburn. Bloating of your abdomen. Inconsistent blood sugar (glucose) levels on blood tests. Unexplained weight loss. Acid from the stomach coming up into the esophagus (gastroesophageal reflux). Sudden tightening (spasm) of the stomach, which can be painful. Symptoms may come and go. Some people may not notice any symptoms. How is this diagnosed? This condition is diagnosed with tests, such as: Tests that check how long it takes food to move through the stomach and intestines. These tests include: Upper gastrointestinal (GI) series. For this test, you drink a liquid that shows up well on X-rays, and then X-rays are taken of your intestines. Gastric emptying scintigraphy. For this test, you eat food that contains a small amount of radioactive material, and then scans are taken. Wireless capsule GI monitoring system. For this test, you swallow a pill (capsule) that records information about how foods and fluid move through your stomach. Gastric manometry. For this test, a tube is passed down your throat and into your stomach to measure electrical and muscular activity. Endoscopy. For this test, a long, thin tube with a camera and light on the end is passed down your throat and into your stomach to check for problems in your stomach lining. Ultrasound. This test uses sound waves to create images of the inside of your body. This can help rule out gallbladder disease or pancreatitis as a cause of your symptoms. How is this treated? There is no cure for this condition, but treatment and  home care may relieve symptoms. Treatment may include: Treating the underlying cause. Managing your symptoms by making changes to your diet and exercise habits. Taking medicines to control nausea and vomiting and to stimulate stomach muscles. Getting food through a feeding tube in the hospital. This may be done in severe cases. Having surgery to insert a device called a gastric electrical stimulator into your body. This device helps improve stomach emptying and control nausea and vomiting. Follow these instructions at home: Take over-the-counter and prescription medicines only as told by your health care provider. Follow instructions from your health care provider about eating or drinking restrictions. Your health care provider may recommend that you: Eat smaller meals more often. Eat low-fat foods. Eat low-fiber forms of high-fiber foods. For example, eat cooked vegetables instead of raw vegetables. Have only liquid foods instead of solid foods. Liquid foods are easier to digest. Drink enough fluid to keep your urine pale yellow. Exercise as often as told by your health care provider. Keep all follow-up visits. This is important. Contact a health care provider if you: Notice that your symptoms do not improve with treatment. Have new symptoms. Get help right away if you: Have severe pain in your abdomen that does not improve with  treatment. Have nausea that is severe or does not go away. Vomit every time you drink fluids. Summary Gastroparesis is a long-term (chronic) condition in which food takes longer than normal to empty from the stomach. Symptoms include nausea, vomiting, heartburn, bloating of your abdomen, and loss of appetite. Eating smaller portions, low-fat foods, and low-fiber forms of high-fiber foods may help you manage your symptoms. Get help right away if you have severe pain in your abdomen. This information is not intended to replace advice given to you by your health  care provider. Make sure you discuss any questions you have with your health care provider. Document Revised: 07/16/2019 Document Reviewed: 07/16/2019 Elsevier Patient Education  2021 [employer].  Thank you for entrusting me with your care and choosing [facility].  Vicie Mutters PA-C

## 2022-04-07 ENCOUNTER — Ambulatory Visit (AMBULATORY_SURGERY_CENTER): Payer: 59 | Admitting: Gastroenterology

## 2022-04-07 ENCOUNTER — Encounter: Payer: Self-pay | Admitting: Gastroenterology

## 2022-04-07 VITALS — BP 142/64 | HR 52 | Temp 96.9°F | Resp 14 | Ht 64.0 in | Wt 174.0 lb

## 2022-04-07 DIAGNOSIS — E119 Type 2 diabetes mellitus without complications: Secondary | ICD-10-CM | POA: Diagnosis not present

## 2022-04-07 DIAGNOSIS — K2950 Unspecified chronic gastritis without bleeding: Secondary | ICD-10-CM

## 2022-04-07 DIAGNOSIS — K317 Polyp of stomach and duodenum: Secondary | ICD-10-CM

## 2022-04-07 DIAGNOSIS — K2980 Duodenitis without bleeding: Secondary | ICD-10-CM | POA: Diagnosis not present

## 2022-04-07 DIAGNOSIS — K760 Fatty (change of) liver, not elsewhere classified: Secondary | ICD-10-CM | POA: Diagnosis not present

## 2022-04-07 DIAGNOSIS — K2970 Gastritis, unspecified, without bleeding: Secondary | ICD-10-CM

## 2022-04-07 DIAGNOSIS — R1013 Epigastric pain: Secondary | ICD-10-CM | POA: Diagnosis not present

## 2022-04-07 MED ORDER — SODIUM CHLORIDE 0.9 % IV SOLN: 500.0000 mL | Freq: Once | INTRAVENOUS | Status: DC

## 2022-04-07 MED ORDER — PANTOPRAZOLE SODIUM 40 MG PO TBEC: 40.0000 mg | DELAYED_RELEASE_TABLET | Freq: Two times a day (BID) | ORAL | 3 refills | Status: DC

## 2022-04-07 NOTE — Progress Notes (Signed)
Pt's states no medical or surgical changes since previsit or office visit. 

## 2022-04-07 NOTE — Progress Notes (Signed)
GASTROENTEROLOGY PROCEDURE H&P NOTE   Primary Care Physician: [name], MD  HPI: Leslie Davis is a 67 y.o. female who presents for EGD for evaluation of Nausea, pain, early satiety.  Past Medical History:  Diagnosis Date   CKD (chronic kidney disease)    stage 3   Coronary artery disease 07/2021   obstructive CAD-70% LAD-treated medically   DM (diabetes mellitus) ([place])    Fatty liver disease, nonalcoholic    Gastritis    Gastroparesis    GERD (gastroesophageal reflux disease)    Heartburn    Hiatal hernia    HLD (hyperlipidemia)    HTN (hypertension)    Joint pain    Renal calculus    Rheumatoid arthritis ([place])    Vitamin D deficiency    Past Surgical History:  Procedure Laterality Date   BREAST BIOPSY  02/04/2022   MM LT RADIOACTIVE SEED EA ADD LESION LOC MAMMO GUIDE 02/04/2022 [facility] MAMMO[facility]AP[facility]   BREAST BIOPSY  02/04/2022   MM LT RADIOACTIVE SEED LOC MAMMO GUIDE 02/04/2022 [facility] MAMMOGRAP[facility]   BREAST LUMPECTOMY WITH RADIOACTIVE SEED LOCALIZATION Left 02/05/2022   Procedure: LEFT BREAST BRACKETED LUMPECTOMY WITH RADIOACTIVE SEED LOCALIZATION;  Surgeon: Jovita Kussmaul, MD;  Location: [facility];  Service: General;  Laterality: Left;   CHOLECYSTECTOMY     COLONOSCOPY  09/10/2011   normal   JOINT REPLACEMENT  2022   left TKA   LEFT HEART CATH AND CORONARY ANGIOGRAPHY N/A 08/18/2021   Procedure: LEFT HEART CATH AND CORONARY ANGIOGRAPHY;  Surgeon: Nigel Mormon, MD;  Location: [facility];  Service: Cardiovascular;  Laterality: N/A;   TOTAL KNEE ARTHROPLASTY Left 10/27/2020   Procedure: LEFT TOTAL KNEE ARTHROPLASTY;  Surgeon: [name], MD;  Location: [facility];  Service: Orthopedics;  Laterality: Left;   TUBAL LIGATION     UPPER GASTROINTESTINAL ENDOSCOPY     Current Outpatient Medications  Medication Sig Dispense Refill   amLODipine (NORVASC) 10 MG tablet TAKE ONE TABLET BY MOUTH ONCE DAILY 90 tablet 3   aspirin EC 81 MG  tablet Take 1 tablet (81 mg total) by mouth daily. Swallow whole. 150 tablet 2   Blood Glucose Monitoring Suppl (ONETOUCH VERIO) w/Device KIT Use as directed to check blood sugars 2 times per day dx: e11.65 1 kit 1   carvedilol (COREG) 3.125 MG tablet TAKE ONE TABLET BY MOUTH TWICE DAILY WITH A MEAL 180 tablet 1   EDARBI 80 MG TABS TAKE ONE TABLET BY MOUTH ONCE DAILY 90 tablet 2   ezetimibe (ZETIA) 10 MG tablet Take 1 tablet (10 mg total) by mouth daily. 90 tablet 3   FARXIGA 10 MG TABS tablet TAKE ONE TABLET BY MOUTH ONCE DAILY BEFORE BREAKFAST 90 tablet 2   ferrous gluconate (FERGON) 324 MG tablet TAKE 1 TABLET BY MOUTH TWICE DAILY WITH A MEAL. (Patient taking differently: Take 324 mg by mouth 2 (two) times daily with a meal.) 100 tablet 0   glucose blood (ONETOUCH VERIO) test strip USE TO check blood glucose TWICE DAILY AS DIRECTED 150 strip 11   OneTouch Delica Lancets 02O MISC Use as directed to check blood sugars 2 times per day dx:e11.65 150 each 3   rosuvastatin (CRESTOR) 20 MG tablet TAKE ONE TABLET BY MOUTH ONCE DAILY 90 tablet 2   temazepam (RESTORIL) 30 MG capsule TAKE ONE CAPSULE BY MOUTH EVERYDAY AT BEDTIME AS NEEDED 30 capsule 2   TRUEPLUS PEN NEEDLES 31G X 6 MM MISC USE WITH  pen TO INJECT insulin DAILY 150 each 3   celecoxib (CELEBREX) 200 MG capsule Take 1 capsule (200 mg total) by mouth 2 (two) times daily as needed. 180 capsule 2   cetirizine (ZYRTEC) 10 MG tablet Take 1 tablet (10 mg total) by mouth daily. 90 tablet 2   linaclotide ([name]) 145 MCG CAPS capsule Take 1 capsule (145 mcg total) by mouth daily. (Patient not taking: Reported on 04/07/2022) 30 capsule 0   metoprolol tartrate (LOPRESSOR) 25 MG tablet TAKE ONE TABLET BY MOUTH twice daily FOR 7 DAYS, start TWO DAYS prior TO your CT scan (Patient not taking: Reported on 04/05/2022) 14 tablet 0   nitroGLYCERIN (NITROSTAT) 0.4 MG SL tablet Place 1 tablet (0.4 mg total) under the tongue every 5 (five) minutes as needed for  chest pain. If you require more than two tablets five minutes apart go to the nearest ER via EMS. 30 tablet 0   ondansetron (ZOFRAN) 4 MG tablet Take 1 tablet (4 mg total) by mouth every 8 (eight) hours as needed for nausea or vomiting. 20 tablet 0   oxyCODONE (ROXICODONE) 5 MG immediate release tablet Take 1 tablet (5 mg total) by mouth every 6 (six) hours as needed for severe pain. 10 tablet 0   pantoprazole (PROTONIX) 40 MG tablet TAKE ONE TABLET BY MOUTH EVERY MORNING 90 tablet 2   Semaglutide, 2 MG/DOSE, 8 MG/3ML SOPN Inject 2 mg as directed once a week. 9 mL 0   traMADol (ULTRAM) 50 MG tablet Take 1 tablet (50 mg total) by mouth every 12 (twelve) hours as needed. 30 tablet 0   Current Facility-Administered Medications  Medication Dose Route Frequency Provider Last Rate Last Admin   0.9 %  sodium chloride infusion  500 mL Intravenous Once Mansouraty, Telford Nab., MD        Current Outpatient Medications:    amLODipine (NORVASC) 10 MG tablet, TAKE ONE TABLET BY MOUTH ONCE DAILY, Disp: 90 tablet, Rfl: 3   aspirin EC 81 MG tablet, Take 1 tablet (81 mg total) by mouth daily. Swallow whole., Disp: 150 tablet, Rfl: 2   Blood Glucose Monitoring Suppl (ONETOUCH VERIO) w/Device KIT, Use as directed to check blood sugars 2 times per day dx: e11.65, Disp: 1 kit, Rfl: 1   carvedilol (COREG) 3.125 MG tablet, TAKE ONE TABLET BY MOUTH TWICE DAILY WITH A MEAL, Disp: 180 tablet, Rfl: 1   EDARBI 80 MG TABS, TAKE ONE TABLET BY MOUTH ONCE DAILY, Disp: 90 tablet, Rfl: 2   ezetimibe (ZETIA) 10 MG tablet, Take 1 tablet (10 mg total) by mouth daily., Disp: 90 tablet, Rfl: 3   FARXIGA 10 MG TABS tablet, TAKE ONE TABLET BY MOUTH ONCE DAILY BEFORE BREAKFAST, Disp: 90 tablet, Rfl: 2   ferrous gluconate (FERGON) 324 MG tablet, TAKE 1 TABLET BY MOUTH TWICE DAILY WITH A MEAL. (Patient taking differently: Take 324 mg by mouth 2 (two) times daily with a meal.), Disp: 100 tablet, Rfl: 0   glucose blood (ONETOUCH VERIO) test  strip, USE TO check blood glucose TWICE DAILY AS DIRECTED, Disp: 150 strip, Rfl: 11   OneTouch Delica Lancets 03J MISC, Use as directed to check blood sugars 2 times per day dx:e11.65, Disp: 150 each, Rfl: 3   rosuvastatin (CRESTOR) 20 MG tablet, TAKE ONE TABLET BY MOUTH ONCE DAILY, Disp: 90 tablet, Rfl: 2   temazepam (RESTORIL) 30 MG capsule, TAKE ONE CAPSULE BY MOUTH EVERYDAY AT BEDTIME AS NEEDED, Disp: 30 capsule, Rfl: 2   TRUEPLUS  PEN NEEDLES 31G X 6 MM MISC, USE WITH pen TO INJECT insulin DAILY, Disp: 150 each, Rfl: 3   celecoxib (CELEBREX) 200 MG capsule, Take 1 capsule (200 mg total) by mouth 2 (two) times daily as needed., Disp: 180 capsule, Rfl: 2   cetirizine (ZYRTEC) 10 MG tablet, Take 1 tablet (10 mg total) by mouth daily., Disp: 90 tablet, Rfl: 2   linaclotide ([name]) 145 MCG CAPS capsule, Take 1 capsule (145 mcg total) by mouth daily. (Patient not taking: Reported on 04/07/2022), Disp: 30 capsule, Rfl: 0   metoprolol tartrate (LOPRESSOR) 25 MG tablet, TAKE ONE TABLET BY MOUTH twice daily FOR 7 DAYS, start TWO DAYS prior TO your CT scan (Patient not taking: Reported on 04/05/2022), Disp: 14 tablet, Rfl: 0   nitroGLYCERIN (NITROSTAT) 0.4 MG SL tablet, Place 1 tablet (0.4 mg total) under the tongue every 5 (five) minutes as needed for chest pain. If you require more than two tablets five minutes apart go to the nearest ER via EMS., Disp: 30 tablet, Rfl: 0   ondansetron (ZOFRAN) 4 MG tablet, Take 1 tablet (4 mg total) by mouth every 8 (eight) hours as needed for nausea or vomiting., Disp: 20 tablet, Rfl: 0   oxyCODONE (ROXICODONE) 5 MG immediate release tablet, Take 1 tablet (5 mg total) by mouth every 6 (six) hours as needed for severe pain., Disp: 10 tablet, Rfl: 0   pantoprazole (PROTONIX) 40 MG tablet, TAKE ONE TABLET BY MOUTH EVERY MORNING, Disp: 90 tablet, Rfl: 2   Semaglutide, 2 MG/DOSE, 8 MG/3ML SOPN, Inject 2 mg as directed once a week., Disp: 9 mL, Rfl: 0   traMADol (ULTRAM) 50 MG  tablet, Take 1 tablet (50 mg total) by mouth every 12 (twelve) hours as needed., Disp: 30 tablet, Rfl: 0  Current Facility-Administered Medications:    0.9 %  sodium chloride infusion, 500 mL, Intravenous, Once, Mansouraty, Telford Nab., MD Allergies  Allergen Reactions   Wynema Birch Aspart (W-Niacinamide)] Hives   Family History  Problem Relation Age of Onset   Diabetes Mother    Thyroid disease Mother    Kidney disease Mother    Hypertension Father    Hyperlipidemia Father    Sudden death Father    Stroke Father    Diabetes Sister    Heart disease Sister    Kidney disease Sister    COPD Sister    Heart disease Brother 59   Diabetes Brother    Stomach cancer Maternal Aunt        X 2 aunts   Diabetes Maternal Grandmother    Colon cancer Neg Hx    Esophageal cancer Neg Hx    Rectal cancer Neg Hx    Colon polyps Neg Hx    Social History   Socioeconomic History   Marital status: Married    Spouse name: Donni Oglesby   Number of children: 2   Years of education: Not on file   Highest education level: Not on file  Occupational History   Occupation: Housekeeping    Comment: Estate agent Service  Tobacco Use   Smoking status: Former    Packs/day: 0.25    Years: 1.00    Total pack years: 0.25    Types: Cigarettes    Quit date: 09/10/1974    Years since quitting: 47.6   Smokeless tobacco: Never   Tobacco comments:    she no longer smokes.   Vaping Use   Vaping Use: Never used  Substance and Sexual Activity  Alcohol use: Never   Drug use: Never   Sexual activity: Not Currently    Birth control/protection: Post-menopausal  Other Topics Concern   Not on file  Social History Narrative   Lives with husband.  Part time job.    Social Determinants of Health   Financial Resource Strain: Low Risk  (12/19/2021)   Overall Financial Resource Strain (CARDIA)    Difficulty of Paying Living Expenses: Not hard at all  Food Insecurity: No Food Insecurity (12/19/2021)    Hunger Vital Sign    Worried About Running Out of Food in the Last Year: Never true    Ran Out of Food in the Last Year: Never true  Transportation Needs: No Transportation Needs (12/19/2021)   PRAPARE - Hydrologist (Medical): No    Lack of Transportation (Non-Medical): No  Physical Activity: Inactive (12/19/2021)   Exercise Vital Sign    Days of Exercise per Week: 0 days    Minutes of Exercise per Session: 0 min  Stress: No Stress Concern Present (12/19/2021)   [place]    Feeling of Stress : Not at all  Social Connections: [place] (12/19/2021)   Social Connection and Isolation Panel [NHANES]    Frequency of Communication with Friends and Family: More than three times a week    Frequency of Social Gatherings with Friends and Family: Once a week    Attends Religious Services: More than 4 times per year    Active Member of [employer] or Organizations: Yes    Attends Archivist Meetings: More than 4 times per year    Marital Status: Married  Human resources officer Violence: Not At Risk (12/19/2021)   Humiliation, Afraid, Rape, and Kick questionnaire    Fear of Current or Ex-Partner: No    Emotionally Abused: No    Physically Abused: No    Sexually Abused: No    Physical Exam: Today's Vitals   04/07/22 1249  BP: (!) 190/74  Pulse: (!) 52  Temp: (!) 96.9 F (36.1 C)  TempSrc: Temporal  SpO2: 100%  Weight: 174 lb (78.9 kg)  Height: '5\' 4"'$  (1.626 m)   Body mass index is 29.87 kg/m. GEN: NAD EYE: Sclerae anicteric ENT: MMM CV: Non-tachycardic GI: Soft, NT/ND NEURO:  Alert & Oriented x 3  Lab Results: No results for input(s): "WBC", "HGB", "HCT", "PLT" in the last 72 hours. BMET No results for input(s): "NA", "K", "CL", "CO2", "GLUCOSE", "BUN", "CREATININE", "CALCIUM" in the last 72 hours. LFT No results for input(s): "PROT", "ALBUMIN", "AST", "ALT", "ALKPHOS",  "BILITOT", "BILIDIR", "IBILI" in the last 72 hours. PT/INR No results for input(s): "LABPROT", "INR" in the last 72 hours.   Impression / Plan: This is a 67 y.o.female who presents for EGD for evaluation of Nausea, pain, early satiety.  The risks and benefits of endoscopic evaluation/treatment were discussed with the patient and/or family; these include but are not limited to the risk of perforation, infection, bleeding, missed lesions, lack of diagnosis, severe illness requiring hospitalization, as well as anesthesia and sedation related illnesses.  The patient's history has been reviewed, patient examined, no change in status, and deemed stable for procedure.  The patient and/or family is agreeable to proceed.    Justice Britain, MD [facility] Advanced [facility]oscopy Office # [phone]

## 2022-04-07 NOTE — Progress Notes (Signed)
To pacu, VSS. Report to [email or web address] 

## 2022-04-07 NOTE — Patient Instructions (Addendum)
Handout provided on gastritis.   Increase Protonix (pantoprazole) to '40mg'$  twice daily to heal the inflammation in your stomach and small intestine.  Resume previous diet.  Await pathology results.  Observe patient's clinical course.  If within the next 4 weeks patient still having issues, then would add Carafate to her regimen in effort of trying to optimize and heal the upper GI tract.   YOU HAD AN ENDOSCOPIC PROCEDURE TODAY AT [facility]:   Refer to the procedure report that was given to you for any specific questions about what was found during the examination.  If the procedure report does not answer your questions, please call your gastroenterologist to clarify.  If you requested that your care partner not be given the details of your procedure findings, then the procedure report has been included in a sealed envelope for you to review at your convenience later.  YOU SHOULD EXPECT: Some feelings of bloating in the abdomen. Passage of more gas than usual.  Walking can help get rid of the air that was put into your GI tract during the procedure and reduce the bloating. If you had a lower endoscopy (such as a colonoscopy or flexible sigmoidoscopy) you may notice spotting of blood in your stool or on the toilet paper. If you underwent a bowel prep for your procedure, you may not have a normal bowel movement for a few days.  Please Note:  You might notice some irritation and congestion in your nose or some drainage.  This is from the oxygen used during your procedure.  There is no need for concern and it should clear up in a day or so.  SYMPTOMS TO REPORT IMMEDIATELY:  Following upper endoscopy (EGD)  Vomiting of blood or coffee ground material  New chest pain or pain under the shoulder blades  Painful or persistently difficult swallowing  New shortness of breath  Fever of 100F or higher  Black, tarry-looking stools  For urgent or emergent issues, a gastroenterologist can  be reached at any hour by calling [phone]. Do not use MyChart messaging for urgent concerns.    DIET:  We do recommend a small meal at first, but then you may proceed to your regular diet.  Drink plenty of fluids but you should avoid alcoholic beverages for 24 hours.  ACTIVITY:  You should plan to take it easy for the rest of today and you should NOT DRIVE or use heavy machinery until tomorrow (because of the sedation medicines used during the test).    FOLLOW UP: Our staff will call the number listed on your records the next business day following your procedure.  We will call around 7:15- 8:00 am to check on you and address any questions or concerns that you may have regarding the information given to you following your procedure. If we do not reach you, we will leave a message.     If any biopsies were taken you will be contacted by phone or by letter within the next 1-3 weeks.  Please call us at [phone] if you have not heard about the biopsies in 3 weeks.    SIGNATURES/CONFIDENTIALITY: You and/or your care partner have signed paperwork which will be entered into your electronic medical record.  These signatures attest to the fact that that the information above on your After Visit Summary has been reviewed and is understood.  Full responsibility of the confidentiality of this discharge information lies with you and/or your care-partner.

## 2022-04-07 NOTE — Op Note (Signed)
[facility] Patient Name: Leslie Davis Procedure Date: 04/07/2022 1:28 PM MRN: [id number] Endoscopist: Justice Britain , MD, [id number] Age: 67 Referring MD:  Date of Birth: [birth year] Gender: Female Account #: [id number] Procedure:                Upper GI endoscopy Indications:              Epigastric abdominal pain, Indigestion, Nausea Medicines:                Monitored Anesthesia Care Procedure:                Pre-Anesthesia Assessment:                           - Prior to the procedure, a History and Physical                            was performed, and patient medications and                            allergies were reviewed. The patient's tolerance of                            previous anesthesia was also reviewed. The risks                            and benefits of the procedure and the sedation                            options and risks were discussed with the patient.                            All questions were answered, and informed consent                            was obtained. Prior Anticoagulants: The patient has                            taken no anticoagulant or antiplatelet agents                            except for aspirin. ASA Grade Assessment: II - A                            patient with mild systemic disease. After reviewing                            the risks and benefits, the patient was deemed in                            satisfactory condition to undergo the procedure.                           After obtaining informed consent, the endoscope was  passed under direct vision. Throughout the                            procedure, the patient's blood pressure, pulse, and                            oxygen saturations were monitored continuously. The                            Endoscope was introduced through the mouth, and                            advanced to the second part of duodenum. The upper                             GI endoscopy was accomplished without difficulty.                            The patient tolerated the procedure. Scope In: Scope Out: Findings:                 No gross lesions were noted in the entire esophagus.                           The Z-line was regular and was found 35 cm from the                            incisors.                           Segmental moderate inflammation characterized by                            erosions, erythema, friability and shallow                            ulcerations was found in the entire examined                            stomach. Biopsies were taken with a cold forceps                            for histology and Helicobacter pylori testing.                           Segmental mild inflammation characterized by                            erythema and granularity was found in the duodenal                            bulb, in the first portion of the duodenum and in                            the  second portion of the duodenum. Biopsies were                            taken with a cold forceps for histology and                            Helicobacter pylori testing. Complications:            No immediate complications. Estimated Blood Loss:     Estimated blood loss was minimal. Impression:               - No gross lesions in the entire esophagus. Z-line                            regular, 35 cm from the incisors.                           - Gastritis. Biopsied.                           - Duodenitis. Biopsied. Recommendation:           - The patient will be observed post-procedure,                            until all discharge criteria are met.                           - Discharge patient to home.                           - Patient has a contact number available for                            emergencies. The signs and symptoms of potential                            delayed complications were discussed with the                             patient. Return to normal activities tomorrow.                            Written discharge instructions were provided to the                            patient.                           - Resume previous diet.                           - Await pathology results.                           - Increase PPI to 40 mg twice daily.                           -  Observe patient's clinical course.                           - If within the next 4 weeks patient still having                            issues, then would add Carafate to her regimen in                            effort of trying to optimize and heal the upper GI                            tract.                           - If symptoms are persisting even after noted above                            issues, then consider CT abdomen pelvis as well as                            a solid food gastric emptying study.                           - Recommend repeat EGD in 4 months to ensure                            healing of upper GI tract and ulcer disease.                           - The findings and recommendations were discussed                            with the patient.                           - The findings and recommendations were discussed                            with the patient's family. Justice Britain, MD 04/07/2022 1:57:18 PM

## 2022-04-08 ENCOUNTER — Telehealth: Payer: Self-pay

## 2022-04-08 NOTE — Progress Notes (Unsigned)
Chief Complaint:   OBESITY Leslie Davis is here to discuss her progress with her obesity treatment plan along with follow-up of her obesity related diagnoses. Leslie Davis is on the Category 1 Plan and states she is following her eating plan approximately 60% of the time. Leslie Davis states she is going to the gym 90 minutes 3 times per week.  Today's visit was #: 68 Starting weight: 208 LBS Starting date: 07/23/2020 Today's weight: 166 LBS Today's date: 03/24/2022 Total lbs lost to date: 42 LBS Total lbs lost since last in-office visit: +3 LBS  Interim History: Patient is seeing Dr. Baird Cancer for GI upset.  Patient also was seeing him on January 15 for being nauseated at various times throughout the day.  Patient denies abdominal pain, vomiting, bowel changes.  Patient had labs done yesterday, CMP, CBC, amylase, lipase, A1c which were reviewed with patient.  Labs are reassuring and encourage patient to speak with Dr. Baird Cancer about them.  Subjective:   1. Hypertension associated with type 2 diabetes mellitus ([place]) Patient's blood pressure 140/54 today, yesterday 130 over 60s.  On twice it has been over 140/90 in the past 1 month.  2. Type 2 diabetes mellitus with obesity ([place]) Discussed labs with patient today. Fasting blood sugar, 108 has not been over 127.  Patient is asymptomatic with no concerns.  3. Other constipation Patient's constipation is well-controlled.  Patient is not taking [name], but occasionally takes Ex-Lax.  She is taking iron supplements daily.  Assessment/Plan:  No orders of the defined types were placed in this encounter.   There are no discontinued medications.   No orders of the defined types were placed in this encounter.    1. Hypertension associated with type 2 diabetes mellitus ([place]) Continue medications and HBPM.  Decrease salt intake, increase water intake.  Patient is biking, walking, leg exercises for her knee.  Follow-up with PCP, specialist as  planned.  2. Type 2 diabetes mellitus with obesity ([place]) Patient was started back on plan today.  Decrease carbs.  Discussed how to expect and eating poorly.  Patient will hold Ozempic dose this week on Thursday to see if nausea improves.  3. Other constipation Patient's symptoms are controlled.  Continue high-fiber foods and adequate hydration.  Continue exercise.  4. Obesity with current BMI 29.4 Handouts on meal plan with breakfast and lunch options given today to patient.  Leslie Davis is currently in the action stage of change. As such, her goal is to continue with weight loss efforts. She has agreed to the Category 1 Plan with breakfast and lunch options..   Exercise goals:  As is.  Behavioral modification strategies: decreasing simple carbohydrates and planning for success.  Leslie Davis has agreed to follow-up with our clinic in 8 weeks. She was informed of the importance of frequent follow-up visits to maximize her success with intensive lifestyle modifications for her multiple health conditions.   Objective:   Blood pressure (!) 149/61, pulse (!) 52, temperature 98 F (36.7 C), height '5\' 3"'$  (1.6 m), weight 166 lb (75.3 kg), SpO2 100 %. Body mass index is 29.41 kg/m.  General: Cooperative, alert, well developed, in no acute distress. HEENT: Conjunctivae and lids unremarkable. Cardiovascular: Regular rhythm.  Lungs: Normal work of breathing. Neurologic: No focal deficits.   Lab Results  Component Value Date   CREATININE 0.98 03/23/2022   BUN 19 03/23/2022   NA 139 03/23/2022   K 4.7 03/23/2022   CL 104 03/23/2022   CO2 22 03/23/2022  Lab Results  Component Value Date   ALT 54 (H) 03/23/2022   AST 40 03/23/2022   ALKPHOS 114 03/23/2022   BILITOT 0.3 03/23/2022   Lab Results  Component Value Date   HGBA1C 6.3 (H) 03/23/2022   HGBA1C 6.1 (H) 12/02/2021   HGBA1C 6.0 (H) 09/16/2021   HGBA1C 5.7 (H) 09/14/2021   HGBA1C 6.2 (H) 05/28/2021   No results found for:  "INSULIN" Lab Results  Component Value Date   TSH 1.640 06/17/2021   Lab Results  Component Value Date   CHOL 159 12/02/2021   HDL 85 12/02/2021   LDLCALC 62 12/02/2021   TRIG 57 12/02/2021   CHOLHDL 1.9 12/02/2021   Lab Results  Component Value Date   VD25OH 48.3 09/14/2021   VD25OH 82.1 03/31/2021   VD25OH 102.0 (H) 11/20/2020   Lab Results  Component Value Date   WBC 4.4 03/23/2022   HGB 13.2 03/23/2022   HCT 39.7 03/23/2022   MCV 87 03/23/2022   PLT 211 03/23/2022   Lab Results  Component Value Date   IRON 68 11/16/2017   TIBC 355 11/16/2017   FERRITIN 45.2 11/16/2017   Attestation Statements:   Reviewed by clinician on day of visit: allergies, medications, problem list, medical history, surgical history, family history, social history, and previous encounter notes.  I, Davy Pique, RMA, am acting as Location manager for [employer], DO.   I have reviewed the above documentation for accuracy and completeness, and I agree with the above. Marjory Sneddon, D.O.  The [place] was signed into law in 2016 which includes the topic of electronic health records.  This provides immediate access to information in MyChart.  This includes consultation notes, operative notes, office notes, lab results and pathology reports.  If you have any questions about what you read please let us know at your next visit so we can discuss your concerns and take corrective action if need be.  We are right here with you.

## 2022-04-08 NOTE — Telephone Encounter (Signed)
  Follow up Call-     04/07/2022   12:50 PM 11/24/2021   10:13 AM  Call back number  Post procedure Call Back phone  # [phone]  Permission to leave phone message Yes Yes     Follow up call made.  NALM

## 2022-04-14 ENCOUNTER — Telehealth: Payer: Self-pay

## 2022-04-16 ENCOUNTER — Telehealth: Payer: Self-pay

## 2022-04-16 NOTE — Progress Notes (Addendum)
Care Management [facility] Coordination Services Pharmacy Team  Reason for Encounter: Medication coordination and delivery  Contacted patient to discuss medications and coordinate delivery from [facility]. Spoke with patient on 04/16/2022  Cycle dispensing form sent to [facility] for review.   Last adherence delivery date: 03-30-2022  Patient is due for next adherence delivery on: 04-28-2022  This delivery to include: Vials  30 Days  Farxiga '10mg'$  1 tablet daily Edarbi 80 mg- 1 tablet daily  Fergon 324 mg  2 times a day  Ozempic 2 mg weekly  Amlodipine 10 mg daily Rosuvastatin 20 mg daily Carvedilol 3.125 mg twice daily Pantoprazole 40 mg daily  Aspirin 81 mg daily Cetirizine 10 mg daily Celebrex 200 mg twice daily PRN Zetia 10 mg daily Temazepam 30 mg at bedtime PRN- Add to delivery Tramadol 50 mg every 12 hours as needed- Add to delivery  Patient declined the following medications this month: [name]- Not taking Metoprolol- Not taking Celebrex- Plenty supply   Refills requested from providers include: Tramadol- [place] will request from specialist  Confirmed delivery date of 04-28-2022, advised patient that pharmacy will contact them the morning of delivery.   Any concerns about your medications? No  How often do you forget or accidentally miss a dose? Never  Do you use a pillbox? Yes  Is patient in packaging No   Recent blood pressure readings are as follows: 142/64 04-07-2022  Recent A1C readings are as follows: 6.3 03-23-2022  Chart review: Recent office visits:  03-23-2022 [name], MD. ALT= 54. A1C= 6.3. START Zofran 4 mg every 8 hours PRN.  Recent consult visits:  04-07-2022 Mansouraty, Telford Nab., MD. Upper endoscopy procedure completed.   04-05-2022 Vladimir Crofts, PA-C Gertie Fey). Referral placed to gastro.  03-24-2022 [name], DO (Weight management). Follow up visit.  Hospital visits:  None in previous 6  months  Medications: Outpatient Encounter Medications as of 04/16/2022  Medication Sig   amLODipine (NORVASC) 10 MG tablet TAKE ONE TABLET BY MOUTH ONCE DAILY   aspirin EC 81 MG tablet Take 1 tablet (81 mg total) by mouth daily. Swallow whole.   Blood Glucose Monitoring Suppl (ONETOUCH VERIO) w/Device KIT Use as directed to check blood sugars 2 times per day dx: e11.65   carvedilol (COREG) 3.125 MG tablet TAKE ONE TABLET BY MOUTH TWICE DAILY WITH A MEAL   celecoxib (CELEBREX) 200 MG capsule Take 1 capsule (200 mg total) by mouth 2 (two) times daily as needed.   cetirizine (ZYRTEC) 10 MG tablet Take 1 tablet (10 mg total) by mouth daily.   EDARBI 80 MG TABS TAKE ONE TABLET BY MOUTH ONCE DAILY   ezetimibe (ZETIA) 10 MG tablet Take 1 tablet (10 mg total) by mouth daily.   FARXIGA 10 MG TABS tablet TAKE ONE TABLET BY MOUTH ONCE DAILY BEFORE BREAKFAST   ferrous gluconate (FERGON) 324 MG tablet TAKE 1 TABLET BY MOUTH TWICE DAILY WITH A MEAL. (Patient taking differently: Take 324 mg by mouth 2 (two) times daily with a meal.)   glucose blood (ONETOUCH VERIO) test strip USE TO check blood glucose TWICE DAILY AS DIRECTED   linaclotide ([name]) 145 MCG CAPS capsule Take 1 capsule (145 mcg total) by mouth daily. (Patient not taking: Reported on 04/07/2022)   metoprolol tartrate (LOPRESSOR) 25 MG tablet TAKE ONE TABLET BY MOUTH twice daily FOR 7 DAYS, start TWO DAYS prior TO your CT scan (Patient not taking: Reported on 04/05/2022)   nitroGLYCERIN (NITROSTAT) 0.4 MG SL tablet Place 1  tablet (0.4 mg total) under the tongue every 5 (five) minutes as needed for chest pain. If you require more than two tablets five minutes apart go to the nearest ER via EMS.   ondansetron (ZOFRAN) 4 MG tablet Take 1 tablet (4 mg total) by mouth every 8 (eight) hours as needed for nausea or vomiting.   OneTouch Delica Lancets 16X MISC Use as directed to check blood sugars 2 times per day dx:e11.65   oxyCODONE (ROXICODONE) 5 MG  immediate release tablet Take 1 tablet (5 mg total) by mouth every 6 (six) hours as needed for severe pain.   pantoprazole (PROTONIX) 40 MG tablet Take 1 tablet (40 mg total) by mouth 2 (two) times daily.   rosuvastatin (CRESTOR) 20 MG tablet TAKE ONE TABLET BY MOUTH ONCE DAILY   Semaglutide, 2 MG/DOSE, 8 MG/3ML SOPN Inject 2 mg as directed once a week.   temazepam (RESTORIL) 30 MG capsule TAKE ONE CAPSULE BY MOUTH EVERYDAY AT BEDTIME AS NEEDED   traMADol (ULTRAM) 50 MG tablet Take 1 tablet (50 mg total) by mouth every 12 (twelve) hours as needed.   TRUEPLUS PEN NEEDLES 31G X 6 MM MISC USE WITH pen TO INJECT insulin DAILY   No facility-administered encounter medications on file as of 04/16/2022.   BP Readings from Last 3 Encounters:  04/07/22 (!) 142/64  04/05/22 (!) 140/70  03/24/22 (!) 149/61    Pulse Readings from Last 3 Encounters:  04/07/22 (!) 52  04/05/22 (!) 53  03/24/22 (!) 52    Lab Results  Component Value Date/Time   HGBA1C 6.3 (H) 03/23/2022 10:32 AM   HGBA1C 6.1 (H) 12/02/2021 10:56 AM   Lab Results  Component Value Date   CREATININE 0.98 03/23/2022   BUN 19 03/23/2022   GFR 47.43 (L) 12/23/2020   GFRNONAA >60 02/01/2022   GFRAA 57 (L) 02/21/2020   NA 139 03/23/2022   K 4.7 03/23/2022   CALCIUM 9.9 03/23/2022   CO2 22 03/23/2022     [facility] Pharmacist Assistant [phone]

## 2022-04-17 ENCOUNTER — Encounter: Payer: Self-pay | Admitting: Gastroenterology

## 2022-04-20 ENCOUNTER — Ambulatory Visit (INDEPENDENT_AMBULATORY_CARE_PROVIDER_SITE_OTHER): Payer: 59 | Admitting: Internal Medicine

## 2022-04-20 ENCOUNTER — Encounter: Payer: Self-pay | Admitting: Internal Medicine

## 2022-04-20 VITALS — BP 130/78 | HR 59 | Temp 98.1°F | Ht 62.4 in | Wt 175.0 lb

## 2022-04-20 DIAGNOSIS — I25118 Atherosclerotic heart disease of native coronary artery with other forms of angina pectoris: Secondary | ICD-10-CM | POA: Diagnosis not present

## 2022-04-20 DIAGNOSIS — I1310 Hypertensive heart and chronic kidney disease without heart failure, with stage 1 through stage 4 chronic kidney disease, or unspecified chronic kidney disease: Secondary | ICD-10-CM | POA: Diagnosis not present

## 2022-04-20 DIAGNOSIS — R051 Acute cough: Secondary | ICD-10-CM | POA: Diagnosis not present

## 2022-04-20 DIAGNOSIS — R0981 Nasal congestion: Secondary | ICD-10-CM | POA: Diagnosis not present

## 2022-04-20 DIAGNOSIS — N182 Chronic kidney disease, stage 2 (mild): Secondary | ICD-10-CM | POA: Diagnosis not present

## 2022-04-20 DIAGNOSIS — E6609 Other obesity due to excess calories: Secondary | ICD-10-CM

## 2022-04-20 LAB — POC INFLUENZA A&B (BINAX/QUICKVUE)
Influenza A, POC: NEGATIVE
Influenza B, POC: NEGATIVE

## 2022-04-20 LAB — POC COVID19 BINAXNOW: SARS Coronavirus 2 Ag: NEGATIVE

## 2022-04-20 MED ORDER — GUAIFENESIN-CODEINE 100-10 MG/5ML PO SYRP: 5.0000 mL | ORAL_SOLUTION | Freq: Three times a day (TID) | ORAL | 0 refills | Status: DC | PRN

## 2022-04-20 MED ORDER — BENZONATATE 100 MG PO CAPS: 100.0000 mg | ORAL_CAPSULE | Freq: Three times a day (TID) | ORAL | 1 refills | Status: DC | PRN

## 2022-04-20 NOTE — Progress Notes (Signed)
[name],acting as a Education administrator for [name], MD.,have documented all relevant documentation on the behalf of [name], MD,as directed by  [name], MD while in the presence of [name], MD.    Subjective:     Patient ID: Leslie Davis , female    DOB: [birth year] , 67 y.o.   MRN: [id number]   Chief Complaint  Patient presents with   URI    HPI  She presents today for further evaluation of cold sx. She states she has developed sinus congestion, cough and fatigue. She states the cough worsens at night. Her sx started yesterday. She denies having body aches; however, she does admit to having headaches. Denies ill contacts. She tried some Mucinex gelcaps without relief of her sx.   URI  This is a new problem. The current episode started yesterday. The problem has been gradually worsening. There has been no fever. Associated symptoms include congestion, coughing, headaches, sneezing and a sore throat (scratchy). Pertinent negatives include no sinus pain. She has tried decongestant for the symptoms. The treatment provided no relief.     Past Medical History:  Diagnosis Date   CKD (chronic kidney disease)    stage 3   Coronary artery disease 07/2021   obstructive CAD-70% LAD-treated medically   DM (diabetes mellitus) ([place])    Fatty liver disease, nonalcoholic    Gastritis    Gastroparesis    GERD (gastroesophageal reflux disease)    Heartburn    Hiatal hernia    HLD (hyperlipidemia)    HTN (hypertension)    Joint pain    Renal calculus    Rheumatoid arthritis ([place])    Vitamin D deficiency      Family History  Problem Relation Age of Onset   Diabetes Mother    Thyroid disease Mother    Kidney disease Mother    Hypertension Father    Hyperlipidemia Father    Sudden death Father    Stroke Father    Diabetes Sister    Heart disease Sister    Kidney disease Sister    COPD Sister    Heart disease Brother 26   Diabetes Brother     Stomach cancer Maternal Aunt        X 2 aunts   Diabetes Maternal Grandmother    Colon cancer Neg Hx    Esophageal cancer Neg Hx    Rectal cancer Neg Hx    Colon polyps Neg Hx      Current Outpatient Medications:    amLODipine (NORVASC) 10 MG tablet, TAKE ONE TABLET BY MOUTH ONCE DAILY, Disp: 90 tablet, Rfl: 3   aspirin EC 81 MG tablet, Take 1 tablet (81 mg total) by mouth daily. Swallow whole., Disp: 150 tablet, Rfl: 2   benzonatate (TESSALON PERLES) 100 MG capsule, Take 1 capsule (100 mg total) by mouth 3 (three) times daily as needed for cough., Disp: 30 capsule, Rfl: 1   Blood Glucose Monitoring Suppl (ONETOUCH VERIO) w/Device KIT, Use as directed to check blood sugars 2 times per day dx: e11.65, Disp: 1 kit, Rfl: 1   carvedilol (COREG) 3.125 MG tablet, TAKE ONE TABLET BY MOUTH TWICE DAILY WITH A MEAL, Disp: 180 tablet, Rfl: 1   celecoxib (CELEBREX) 200 MG capsule, Take 1 capsule (200 mg total) by mouth 2 (two) times daily as needed., Disp: 180 capsule, Rfl: 2   cetirizine (ZYRTEC) 10 MG tablet, Take 1 tablet (10 mg total) by mouth daily.,  Disp: 90 tablet, Rfl: 2   EDARBI 80 MG TABS, TAKE ONE TABLET BY MOUTH ONCE DAILY, Disp: 90 tablet, Rfl: 2   ezetimibe (ZETIA) 10 MG tablet, Take 1 tablet (10 mg total) by mouth daily., Disp: 90 tablet, Rfl: 3   FARXIGA 10 MG TABS tablet, TAKE ONE TABLET BY MOUTH ONCE DAILY BEFORE BREAKFAST, Disp: 90 tablet, Rfl: 2   ferrous gluconate (FERGON) 324 MG tablet, TAKE 1 TABLET BY MOUTH TWICE DAILY WITH A MEAL. (Patient taking differently: Take 324 mg by mouth 2 (two) times daily with a meal.), Disp: 100 tablet, Rfl: 0   glucose blood (ONETOUCH VERIO) test strip, USE TO check blood glucose TWICE DAILY AS DIRECTED, Disp: 150 strip, Rfl: 11   guaiFENesin-codeine (ROBITUSSIN AC) 100-10 MG/5ML syrup, Take 5 mLs by mouth 3 (three) times daily as needed for cough., Disp: 120 mL, Rfl: 0   linaclotide ([name]) 145 MCG CAPS capsule, Take 1 capsule (145 mcg total) by  mouth daily., Disp: 30 capsule, Rfl: 0   ondansetron (ZOFRAN) 4 MG tablet, Take 1 tablet (4 mg total) by mouth every 8 (eight) hours as needed for nausea or vomiting., Disp: 20 tablet, Rfl: 0   OneTouch Delica Lancets 99991111 MISC, Use as directed to check blood sugars 2 times per day dx:e11.65, Disp: 150 each, Rfl: 3   pantoprazole (PROTONIX) 40 MG tablet, Take 1 tablet (40 mg total) by mouth 2 (two) times daily., Disp: 60 tablet, Rfl: 3   rosuvastatin (CRESTOR) 20 MG tablet, TAKE ONE TABLET BY MOUTH ONCE DAILY, Disp: 90 tablet, Rfl: 2   Semaglutide, 2 MG/DOSE, 8 MG/3ML SOPN, Inject 2 mg as directed once a week., Disp: 9 mL, Rfl: 0   traMADol (ULTRAM) 50 MG tablet, Take 1 tablet (50 mg total) by mouth every 12 (twelve) hours as needed., Disp: 30 tablet, Rfl: 0   TRUEPLUS PEN NEEDLES 31G X 6 MM MISC, USE WITH pen TO INJECT insulin DAILY, Disp: 150 each, Rfl: 3   albuterol (PROAIR HFA) 108 (90 Base) MCG/ACT inhaler, , Disp: 1 each, Rfl: 0   nitroGLYCERIN (NITROSTAT) 0.4 MG SL tablet, Place 1 tablet (0.4 mg total) under the tongue every 5 (five) minutes as needed for chest pain. If you require more than two tablets five minutes apart go to the nearest ER via EMS., Disp: 30 tablet, Rfl: 0   temazepam (RESTORIL) 30 MG capsule, TAKE ONE CAPSULE BY MOUTH AT bedtime AS NEEDED, Disp: 30 capsule, Rfl: 2   Allergies  Allergen Reactions   Fiasp [Insulin Aspart (W-Niacinamide)] Hives     Review of Systems  Constitutional: Negative.  Negative for chills and fever.  HENT:  Positive for congestion, sneezing and sore throat (scratchy). Negative for sinus pain.   Respiratory:  Positive for cough.   Cardiovascular: Negative.   Gastrointestinal: Negative.   Neurological:  Positive for headaches.  Psychiatric/Behavioral: Negative.       Today's Vitals   04/20/22 1500  BP: 130/78  Pulse: (!) 59  Temp: 98.1 F (36.7 C)  Weight: 175 lb (79.4 kg)  Height: 5' 2.4" (1.585 m)  PainSc: 0-No pain   Body mass  index is 31.6 kg/m.  Wt Readings from Last 3 Encounters:  04/20/22 175 lb (79.4 kg)  04/07/22 174 lb (78.9 kg)  04/05/22 174 lb 8 oz (79.2 kg)     Objective:  Physical Exam Vitals and nursing note reviewed.  Constitutional:      Appearance: Normal appearance. She is ill-appearing.  HENT:  Head: Normocephalic and atraumatic.     Right Ear: Tympanic membrane, ear canal and external ear normal. There is no impacted cerumen.     Left Ear: Tympanic membrane, ear canal and external ear normal. There is no impacted cerumen.     Nose:     Comments: Sounds congested    Mouth/Throat:     Comments: Masked  Eyes:     Extraocular Movements: Extraocular movements intact.  Cardiovascular:     Rate and Rhythm: Normal rate and regular rhythm.     Heart sounds: Normal heart sounds.  Pulmonary:     Effort: Pulmonary effort is normal.     Breath sounds: Normal breath sounds. No wheezing or rhonchi.  Skin:    General: Skin is warm.  Neurological:     General: No focal deficit present.     Mental Status: She is alert.  Psychiatric:        Mood and Affect: Mood normal.        Behavior: Behavior normal.      Assessment And Plan:     1. Acute cough Comments: Rapid COVID/flu a/b NEG. She agrees to resp panel swab. I will treat as indicated. I will send rx tessalone perles and Cheratussin AC syrup to use prn. - POC COVID-19 - POC Influenza A&B(BINAX/QUICKVUE) - Respiratory Panel w/ SARS-CoV2  2. Nasal congestion Comments: Again, rapid COVID and flu a/b are negative. - POC COVID-19 - POC Influenza A&B(BINAX/QUICKVUE) - Respiratory Panel w/ SARS-CoV2   Patient was given opportunity to ask questions. Patient verbalized understanding of the plan and was able to repeat key elements of the plan. All questions were answered to their satisfaction.   I, [name], MD, have reviewed all documentation for this visit. The documentation on 05/02/22 for the exam, diagnosis, procedures, and  orders are all accurate and complete.   IF YOU HAVE BEEN REFERRED TO A SPECIALIST, IT MAY TAKE 1-2 WEEKS TO SCHEDULE/PROCESS THE REFERRAL. IF YOU HAVE NOT HEARD FROM US/SPECIALIST IN TWO WEEKS, PLEASE GIVE [place] A CALL AT [phone].   THE PATIENT IS ENCOURAGED TO PRACTICE SOCIAL DISTANCING DUE TO THE COVID-19 PANDEMIC.

## 2022-04-20 NOTE — Patient Instructions (Addendum)
   We discussed increased potential for false negative rapid COVID-19 tests early in illness with current circulating strains and PCR testing is recommended. Isolate while awaiting results. Also discussed sx are consistent with viral syndrome - supportive care and contagious nature of illness reviewed. Will prescribe cheratussin ac 10/'100mg'$ /'5mg'$  syrup and tessalon perles '200mg'$  as needed for cough. She is also encouraged to stay well hydrated, rest and take Tylenol prn fever/sore throat.

## 2022-04-21 ENCOUNTER — Telehealth: Payer: Self-pay

## 2022-04-21 ENCOUNTER — Other Ambulatory Visit: Payer: Self-pay | Admitting: Physician Assistant

## 2022-04-21 ENCOUNTER — Other Ambulatory Visit: Payer: Self-pay | Admitting: Internal Medicine

## 2022-04-21 DIAGNOSIS — G4700 Insomnia, unspecified: Secondary | ICD-10-CM

## 2022-04-21 NOTE — Progress Notes (Signed)
Care Management & Coordination Services Pharmacy Team  Reason for Encounter: Appointment Reminder  Contacted patient to confirm telephone appointment with [name], PharmD on 04-23-2022 at 10:00. Spoke with patient on 04/21/2022   Do you have any problems getting your medications? No  What is your top health concern you would like to discuss at your upcoming visit? Patient stated no concerns but does have cold symptoms. Patient saw PCP on 04-20-2022  Have you seen any other providers since your last visit with PCP? No   Chart review: Recent office visits:  04-20-2022 [name], MD. START tessalon 100 mg 3 times daily PRN, Robitussin AC 5 mls 3 times daily PRN. COMPLETED oxycodone.  Recent consult visits:  None  Hospital visits:  None in previous 6 months  Star Rating Drugs:  Farxiga 10 mg- Last filled 03-29-2022 30 [facility]. Previous 12-28-2021 90 [facility] Rosuvastatin 20 mg- Last filled 03-29-2022 30 [facility]. Previous 12-28-2021 90 [facility] Ozempic 2 mg- Last filled 04-13-2022 28 [facility]. Previous 03-18-2022 28 [facility] Edarbi 80 mg- Last filled 03-29-2022 30 [facility]. Previous 12-28-2021 90 DS  Care Gaps: Annual wellness visit in last year? Yes Covid booster overdue  If Diabetic: Last eye exam / retinopathy screening: 03-09-2022 Last diabetic foot exam: None   Jeannette How [facility] Clinical Pharmacist Assistant [phone]

## 2022-04-22 LAB — RESPIRATORY PANEL W/ SARS-COV2
Adenovirus: NOT DETECTED
Bordetella parapertussis: NOT DETECTED
Bordetella pertussis: NOT DETECTED
Chlamydophila pneumoniae: NOT DETECTED
Coronavirus 229E: NOT DETECTED
Coronavirus HKU1: NOT DETECTED
Coronavirus NL63: NOT DETECTED
Coronavirus OC43: NOT DETECTED
Human Metapneumovirus: NOT DETECTED
Human Rhinovirus/Enterovirus: NOT DETECTED
Influenza A/H1-2009: NOT DETECTED
Influenza A/H1: NOT DETECTED
Influenza A/H3: NOT DETECTED
Influenza A: NOT DETECTED
Influenza B: NOT DETECTED
Mycoplasma pneumoniae: NOT DETECTED
Parainfluenza 1: NOT DETECTED
Parainfluenza 2: NOT DETECTED
Parainfluenza 3: NOT DETECTED
Parainfluenza 4: NOT DETECTED
Respiratory Syncytial Virus: DETECTED — AB
SARS-CoV-2: NOT DETECTED

## 2022-04-23 ENCOUNTER — Other Ambulatory Visit: Payer: Self-pay | Admitting: Internal Medicine

## 2022-04-23 ENCOUNTER — Ambulatory Visit: Payer: 59

## 2022-04-23 MED ORDER — ALBUTEROL SULFATE 108 (90 BASE) MCG/ACT IN AEPB: INHALATION_SPRAY | RESPIRATORY_TRACT | 0 refills | Status: DC

## 2022-04-23 NOTE — Progress Notes (Signed)
Care Management & Coordination Services Pharmacy Note  04/23/2022 Name:  Leslie Davis MRN:  [id number] DOB:  1955/08/20  Summary: Patient reports that she is checking her BP daily, but has been experiencing some shortness of breath.   Recommendations/Changes made from today's visit: Recommend following up with PCP team  Recommend checking BP at least 5 days per week  Follow up plan: Confirmed she is checking her BP at home.  Ms. Economos was counseled on how to use an albuterol inhaler.    Subjective: Leslie Davis is an 67 y.o. year old female who is a primary patient of Leslie Chard, MD.  The care coordination team was consulted for assistance with disease management and care coordination needs.    Engaged with patient by telephone for follow up visit.  Recent office visits: 04/20/2022 PCP OV  Recent consult visits: 04/07/2022 [facility] visits: None in previous 6 months   Objective:  Lab Results  Component Value Date   CREATININE 0.98 03/23/2022   BUN 19 03/23/2022   GFR 47.43 (L) 12/23/2020   EGFR 63 03/23/2022   GFRNONAA >60 02/01/2022   GFRAA 57 (L) 02/21/2020   NA 139 03/23/2022   K 4.7 03/23/2022   CALCIUM 9.9 03/23/2022   CO2 22 03/23/2022   GLUCOSE 94 03/23/2022    Lab Results  Component Value Date/Time   HGBA1C 6.3 (H) 03/23/2022 10:32 AM   HGBA1C 6.1 (H) 12/02/2021 10:56 AM   GFR 47.43 (L) 12/23/2020 03:22 PM   GFR 77.09 11/17/2017 09:00 AM   MICROALBUR 150 11/15/2019 10:20 AM    Last diabetic Eye exam:  Lab Results  Component Value Date/Time   HMDIABEYEEXA No Retinopathy 03/09/2022 12:00 AM    Last diabetic Foot exam: No results found for: "HMDIABFOOTEX"   Lab Results  Component Value Date   CHOL 159 12/02/2021   HDL 85 12/02/2021   LDLCALC 62 12/02/2021   TRIG 57 12/02/2021   CHOLHDL 1.9 12/02/2021       Latest Ref Rng & Units 03/23/2022   10:32 AM 12/02/2021   10:56 AM 06/25/2021    1:50 PM  Hepatic Function   Total Protein 6.0 - 8.5 g/dL 7.0  6.8  7.9   Albumin 3.9 - 4.9 g/dL 4.2  4.2  4.2   AST 0 - 40 IU/L 40  19  47   ALT 0 - 32 IU/L 54  24  37   Alk Phosphatase 44 - 121 IU/L 114  127  94   Total Bilirubin 0.0 - 1.2 mg/dL 0.3  <0.2  0.3     Lab Results  Component Value Date/Time   TSH 1.640 06/17/2021 04:06 PM   TSH 1.790 07/23/2020 10:42 AM   FREET4 1.00 07/23/2020 10:42 AM       Latest Ref Rng & Units 03/23/2022   10:32 AM 12/02/2021   10:56 AM 08/11/2021    9:33 AM  CBC  WBC 3.4 - 10.8 x10E3/uL 4.4  4.7  3.4   Hemoglobin 11.1 - 15.9 g/dL 13.2  13.1  13.5   Hematocrit 34.0 - 46.6 % 39.7  38.7  40.8   Platelets 150 - 450 x10E3/uL 211  268  261     Lab Results  Component Value Date/Time   VD25OH 48.3 09/14/2021 02:38 PM   VD25OH 82.1 03/31/2021 12:42 PM   VITAMINB12 628 12/02/2021 10:56 AM   VITAMINB12 549 06/17/2021 04:06 PM    Clinical ASCVD: Yes  The 10-year ASCVD  risk score (Arnett DK, et al., 2019) is: 20.1%   Values used to calculate the score:     Age: 67 years     Sex: Female     Is Non-Hispanic African American: Yes     Diabetic: Yes     Tobacco smoker: No     Systolic Blood Pressure: 774 mmHg     Is BP treated: Yes     HDL Cholesterol: 85 mg/dL     Total Cholesterol: 159 mg/dL        12/19/2021    1:39 PM 12/19/2021    1:37 PM 09/16/2021    2:57 PM  Depression screen PHQ 2/9  Decreased Interest 0 0 0  Down, Depressed, Hopeless 0 0 0  PHQ - 2 Score 0 0 0     Social History   Tobacco Use  Smoking Status Former   Packs/day: 0.25   Years: 1.00   Total pack years: 0.25   Types: Cigarettes   Quit date: 09/10/1974   Years since quitting: 47.6  Smokeless Tobacco Never  Tobacco Comments   she no longer smokes.    BP Readings from Last 3 Encounters:  04/20/22 130/78  04/07/22 (!) 142/64  04/05/22 (!) 140/70   Pulse Readings from Last 3 Encounters:  04/20/22 (!) 59  04/07/22 (!) 52  04/05/22 (!) 53   Wt Readings from Last 3 Encounters:   04/20/22 175 lb (79.4 kg)  04/07/22 174 lb (78.9 kg)  04/05/22 174 lb 8 oz (79.2 kg)   BMI Readings from Last 3 Encounters:  04/20/22 31.60 kg/m  04/07/22 29.87 kg/m  04/05/22 29.95 kg/m    Allergies  Allergen Reactions   Fiasp [Insulin Aspart (W-Niacinamide)] Hives    Medications Reviewed Today     Reviewed by Leslie Chard, MD (Physician) on 04/20/22 at 74  Med List Status: <None>   Medication Order Taking? Sig Documenting Provider Last Dose Status Informant  amLODipine (NORVASC) 10 MG tablet [id number] Yes TAKE ONE TABLET BY MOUTH ONCE DAILY [name], FNP Taking Active   aspirin EC 81 MG tablet [id number] Yes Take 1 tablet (81 mg total) by mouth daily. Swallow whole. Leslie Chard, MD Taking Active   Blood Glucose Monitoring Suppl [facility] VERIO) w/Device Drucie Opitz [id number] Yes Use as directed to check blood sugars 2 times per day dx: e11.65 Leslie Chard, MD Taking Active Self  carvedilol (COREG) 3.125 MG tablet [id number] Yes TAKE ONE TABLET BY MOUTH TWICE DAILY WITH A MEAL Tolia, Sunit, DO Taking Active   celecoxib (CELEBREX) 200 MG capsule [id number] Yes Take 1 capsule (200 mg total) by mouth 2 (two) times daily as needed. Aundra Dubin, PA-C Taking Active   cetirizine (ZYRTEC) 10 MG tablet [id number] Yes Take 1 tablet (10 mg total) by mouth daily. Leslie Chard, MD Taking Active   EDARBI 80 MG TABS [id number] Yes TAKE ONE TABLET BY MOUTH ONCE DAILY Leslie Chard, MD Taking Active   ezetimibe (ZETIA) 10 MG tablet [id number] Yes Take 1 tablet (10 mg total) by mouth daily. Terri Skains, Sunit, DO Taking Active   FARXIGA 10 MG TABS tablet [id number] Yes TAKE ONE TABLET BY MOUTH ONCE DAILY BEFORE Adella Nissen, MD Taking Active   ferrous gluconate (FERGON) 324 MG tablet [id number] Yes TAKE 1 TABLET BY MOUTH TWICE DAILY WITH A MEAL.  Patient taking differently: Take 324 mg by mouth 2 (two) times daily with a meal.   Mansouraty, Telford Nab., MD Taking Active Self  glucose  blood (ONETOUCH VERIO)  test strip [id number] Yes USE TO check blood glucose TWICE DAILY AS DIRECTED Leslie Chard, MD Taking Active   linaclotide [facility]) 145 MCG CAPS capsule [id number] Yes Take 1 capsule (145 mcg total) by mouth daily. [name], DO Taking Active Self  metoprolol tartrate (LOPRESSOR) 25 MG tablet [id number] Yes TAKE ONE TABLET BY MOUTH twice daily FOR 7 DAYS, start TWO DAYS prior TO your CT scan Adrian Prows, MD Taking Active   nitroGLYCERIN (NITROSTAT) 0.4 MG SL tablet [id number]  Place 1 tablet (0.4 mg total) under the tongue every 5 (five) minutes as needed for chest pain. If you require more than two tablets five minutes apart go to the nearest ER via EMS. Tolia, Sunit, DO  Expired 03/23/22 2359 Self  ondansetron (ZOFRAN) 4 MG tablet [id number] Yes Take 1 tablet (4 mg total) by mouth every 8 (eight) hours as needed for nausea or vomiting. Leslie Chard, MD Taking Active   [facility] Lancets 63A MISC [id number] Yes Use as directed to check blood sugars 2 times per day dx:e11.65 Leslie Chard, MD Taking Active Self  oxyCODONE (ROXICODONE) 5 MG immediate release tablet [id number] Yes Take 1 tablet (5 mg total) by mouth every 6 (six) hours as needed for severe pain. Autumn Messing III, MD Taking Active   pantoprazole (PROTONIX) 40 MG tablet [id number] Yes Take 1 tablet (40 mg total) by mouth 2 (two) times daily. Mansouraty, Telford Nab., MD Taking Active   rosuvastatin (CRESTOR) 20 MG tablet [id number] Yes TAKE ONE TABLET BY MOUTH ONCE DAILY Leslie Chard, MD Taking Active   Semaglutide, 2 MG/DOSE, 8 MG/3ML SOPN [id number] Yes Inject 2 mg as directed once a week. Leslie Chard, MD Taking Active   temazepam (RESTORIL) 30 MG capsule [id number] Yes TAKE ONE CAPSULE BY MOUTH EVERYDAY AT BEDTIME AS NEEDED Leslie Chard, MD Taking Active Self  traMADol (ULTRAM) 50 MG tablet [id number] Yes Take 1 tablet (50 mg total) by mouth every 12 (twelve) hours as needed. Aundra Dubin, PA-C Taking  Active   TRUEPLUS PEN NEEDLES 31G X 6 MM MISC [id number] Yes USE WITH pen TO INJECT insulin DAILY [name], FNP Taking Active Self            SDOH:  (Social Determinants of Health) assessments and interventions performed: No SDOH Interventions    Flowsheet Row Office Visit from 04/20/2022 in [facility] Office Visit from 03/23/2022 in [facility] Office Visit from 12/02/2021 in [facility] Office Visit from 09/16/2021 in [facility] Office Visit from 08/06/2020 in [facility] from 11/27/2019 in [facility]  SDOH Interventions        Depression Interventions/Treatment  PHQ2-9 Score <4 Follow-up Not Indicated PHQ2-9 Score <4 Follow-up Not Indicated PHQ2-9 Score <4 Follow-up Not Indicated PHQ2-9 Score <4 Follow-up Not Indicated Counseling --  Financial Strain Interventions -- -- -- -- -- Intervention Not Indicated       Medication Assistance: None required.  Patient affirms current coverage meets needs.  Medication Access: Within the past 30 days, how often has patient missed a dose of medication? She reports not missing any days of her medication  Is a pillbox or other method used to improve adherence? No  Factors that may affect medication adherence? nonadherence to medications and lack of understanding of disease management Are meds synced by current pharmacy? No  Are meds  delivered by current pharmacy? No  Does patient experience delays in picking up medications due to transportation concerns? No   [facility] Reviewed: Is patient disadvantaged to use [facility]?: No  Current Rx insurance plan: [employer] Name and location of Current pharmacy:  [facility] - [place], [place] - [place] [address] [place] [address] Phone: [phone] Fax: [phone]  [place], [place] [address] Phone: [phone] Fax: [phone]  [facility] services reviewed with patient today?: No  Patient requests to transfer care to [facility]?:  Patient is already with [facility] Reason patient declined to change pharmacies: Patient is already actively enrolled with [facility]  Compliance/Adherence/Medication fill history: Care Gaps: COVID-19 Booster   Star-Rating Drugs: Rosuvastatin 20 mg tablet  Farxiga 10 mg tablet    Assessment/Plan   Hypertension (BP goal <130/80) -Controlled -Current treatment: Amlodipine 10 mg tablet once per day Appropriate, Effective, Safe, Accessible Edarbi 80 mg tablet once per day Appropriate, Effective, Safe, Accessible Carvedilol 3.125 mg tablet twice per day Appropriate, Effective, Safe, Accessible -Current home readings: 124/71  -Current dietary habits: she has increased her water intake to at least 6 bottles of water per day -Current exercise habits: will discuss further during next office visit -Denies hypotensive/hypertensive symptoms -Educated on BP goals and benefits of medications for prevention of heart attack, stroke and kidney damage; Importance of home blood pressure monitoring; Proper BP monitoring technique; -Counseled to monitor BP at home at least 5 days per week, document, and provide log at future appointments -Recommended to continue current medication  [name], CPP, PharmD Clinical Pharmacist Practitioner [facility] [phone]

## 2022-05-01 ENCOUNTER — Other Ambulatory Visit: Payer: Self-pay | Admitting: Internal Medicine

## 2022-05-01 DIAGNOSIS — E1159 Type 2 diabetes mellitus with other circulatory complications: Secondary | ICD-10-CM

## 2022-05-02 DIAGNOSIS — R0981 Nasal congestion: Secondary | ICD-10-CM | POA: Insufficient documentation

## 2022-05-02 DIAGNOSIS — R051 Acute cough: Secondary | ICD-10-CM | POA: Insufficient documentation

## 2022-05-10 ENCOUNTER — Telehealth: Payer: Self-pay | Admitting: Gastroenterology

## 2022-05-10 DIAGNOSIS — K2970 Gastritis, unspecified, without bleeding: Secondary | ICD-10-CM

## 2022-05-10 DIAGNOSIS — R1013 Epigastric pain: Secondary | ICD-10-CM

## 2022-05-10 DIAGNOSIS — R110 Nausea: Secondary | ICD-10-CM

## 2022-05-10 NOTE — Telephone Encounter (Signed)
Patient called states she is still having abdominal issues and would like further advise.

## 2022-05-11 MED ORDER — SUCRALFATE 1 G PO TABS: 1.0000 g | ORAL_TABLET | Freq: Three times a day (TID) | ORAL | 4 refills | Status: DC

## 2022-05-11 NOTE — Telephone Encounter (Signed)
Please move forward as outlined and okay to send Carafate.  Use 2-4 times daily.  60/4 for prescription. Would go ahead and move forward with CT abdomen pelvis with IV and oral contrast and also a solid food gastric emptying study. For now we will continue to still plan repeat EGD at [facility] to ensure healing. Thanks. GM

## 2022-05-11 NOTE — Telephone Encounter (Signed)
The pt has been advised and agrees to carafate, CT and GES. The order has been entered and sent to the schedulers.

## 2022-05-11 NOTE — Telephone Encounter (Signed)
Dr Rush Landmark the pt complains of abdominal pain. She has increased the PPI to twice daily.  Per the procedure report if pt no better in 4 weeks then carafate could be prescribed.  Is this ok to send?      Resume previous diet.                           - Await pathology results.                           - Increase PPI to 40 mg twice daily.                           - Observe patient's clinical course.                           - If within the next 4 weeks patient still having                            issues, then would add Carafate to her regimen in                            effort of trying to optimize and heal the upper GI                            tract.                           - If symptoms are persisting even after noted above                            issues, then consider CT abdomen pelvis as well as                            a solid food gastric emptying study.                           - Recommend repeat EGD in 4 months to ensure                            healing of upper GI tract and ulcer disease.                           - The findings and recommendations were discussed                            with the patient.                           - The findings and recommendations were discussed                            with the patient's family. Justice Britain, MD

## 2022-05-18 ENCOUNTER — Other Ambulatory Visit: Payer: Self-pay

## 2022-05-18 ENCOUNTER — Other Ambulatory Visit: Payer: Self-pay | Admitting: Internal Medicine

## 2022-05-18 ENCOUNTER — Telehealth: Payer: Self-pay

## 2022-05-18 DIAGNOSIS — G4700 Insomnia, unspecified: Secondary | ICD-10-CM

## 2022-05-18 MED ORDER — TEMAZEPAM 30 MG PO CAPS: ORAL_CAPSULE | ORAL | 3 refills | Status: DC

## 2022-05-18 NOTE — Progress Notes (Signed)
Care Management & Coordination Services Pharmacy Team  Reason for Encounter: Medication coordination and delivery  Contacted patient to discuss medications and coordinate delivery from [facility]. Spoke with patient on 05/18/2022  Cycle dispensing form sent to Leslie Davis for review.   Last adherence delivery date: 04-28-2022      Patient is due for next adherence delivery on: 05-28-2022  This delivery to include: Vials  30 Days  Farxiga '10mg'$  1 tablet daily Edarbi 80 mg- 1 tablet daily  Fergon 324 mg  2 times a day  Ozempic 2 mg weekly  Amlodipine 10 mg daily Rosuvastatin 20 mg daily Carvedilol 3.125 mg twice daily Pantoprazole 40 mg daily  Aspirin 81 mg daily Cetirizine 10 mg daily Zetia 10 mg daily  Temazepam 30 mg at night PRN- Add to delivery. Requested refills  Patient declined the following medications this month: Celebrex- Plenty supply  [name]- Plenty supply Al[facility]erol- [facility]ills at [facility] Tramadol- completed Sucralfate- filled at [facility] Zofran- plenty supply Nitrostat- plenty supply  No refill request needed.  Confirmed delivery date of 05-28-2022, advised patient that pharmacy will contact them the morning of delivery.   Any concerns about your medications? No  How often do you forget or accidentally miss a dose? Never  Do you use a pillbox? Yes  Is patient in packaging No  Recent blood pressure readings are as follows: 02-26 139/86, 02-20 150/81  Recent blood glucose readings are as follows: 02-26 147, 02-20 138   Chart review: Recent office visits:  None  Recent consult visits:  None  Hospital visits:  None in previous 6 months  Medications: Outpatient Encounter Medications as of 05/18/2022  Medication Sig   albuterol (PROAIR HFA) 108 (90 Base) MCG/ACT inhaler    amLODipine (NORVASC) 10 MG tablet TAKE ONE TABLET BY MOUTH ONCE DAILY   aspirin EC 81 MG tablet Take 1 tablet (81 mg total) by mouth daily. Swallow whole.   benzonatate (TESSALON  PERLES) 100 MG capsule Take 1 capsule (100 mg total) by mouth 3 (three) times daily as needed for cough.   Blood Glucose Monitoring Suppl (ONETOUCH VERIO) w/Device KIT Use as directed to check blood sugars 2 times per day dx: e11.65   carvedilol (COREG) 3.125 MG tablet TAKE ONE TABLET BY MOUTH TWICE DAILY WITH A MEAL   celecoxib (CELEBREX) 200 MG capsule Take 1 capsule (200 mg total) by mouth 2 (two) times daily as needed.   cetirizine (ZYRTEC) 10 MG tablet Take 1 tablet (10 mg total) by mouth daily.   EDARBI 80 MG TABS TAKE ONE TABLET BY MOUTH ONCE DAILY   ezetimibe (ZETIA) 10 MG tablet Take 1 tablet (10 mg total) by mouth daily.   FARXIGA 10 MG TABS tablet TAKE ONE TABLET BY MOUTH ONCE DAILY BEFORE BREAKFAST   ferrous gluconate (FERGON) 324 MG tablet TAKE 1 TABLET BY MOUTH TWICE DAILY WITH A MEAL. (Patient taking differently: Take 324 mg by mouth 2 (two) times daily with a meal.)   glucose blood (ONETOUCH VERIO) test strip USE TO check blood glucose TWICE DAILY AS DIRECTED   guaiFENesin-codeine (ROBITUSSIN AC) 100-10 MG/5ML syrup Take 5 mLs by mouth 3 (three) times daily as needed for cough.   linaclotide ([name]) 145 MCG CAPS capsule Take 1 capsule (145 mcg total) by mouth daily.   nitroGLYCERIN (NITROSTAT) 0.4 MG SL tablet Place 1 tablet (0.4 mg total) under the tongue every 5 (five) minutes as needed for chest pain. If you require more than two tablets five minutes apart  go to the nearest ER via EMS.   ondansetron (ZOFRAN) 4 MG tablet Take 1 tablet (4 mg total) by mouth every 8 (eight) hours as needed for nausea or vomiting.   [employer] Lancets [id number] MISC Use as directed to check blood sugars 2 times per day dx:e11.65   OZEMPIC, 2 MG/DOSE, 8 MG/3ML SOPN Inject '2mg'$  into THE SKIN ONCE WEEKLY   pantoprazole (PROTONIX) 40 MG tablet Take 1 tablet (40 mg total) by mouth 2 (two) times daily.   rosuvastatin (CRESTOR) 20 MG tablet TAKE ONE TABLET BY MOUTH ONCE DAILY   sucralfate (CARAFATE) 1 g  tablet Take 1 tablet (1 g total) by mouth 4 (four) times daily -  with meals and at bedtime.   temazepam (RESTORIL) 30 MG capsule TAKE ONE CAPSULE BY MOUTH AT bedtime AS NEEDED   traMADol (ULTRAM) 50 MG tablet Take 1 tablet (50 mg total) by mouth every 12 (twelve) hours as needed.   TRUEPLUS PEN NEEDLES 31G X 6 MM MISC USE WITH pen TO INJECT insulin DAILY   No facility-administered encounter medications on file as of 05/18/2022.   BP Readings from Last 3 Encounters:  04/20/22 130/78  04/07/22 (!) 142/64  04/05/22 (!) 140/70    Pulse Readings from Last 3 Encounters:  04/20/22 (!) 59  04/07/22 (!) 52  04/05/22 (!) 53    Lab Results  Component Value Date/Time   HGBA1C 6.3 (H) 03/23/2022 10:32 AM   HGBA1C 6.1 (H) 12/02/2021 10:56 AM   Lab Results  Component Value Date   CREATININE 0.98 03/23/2022   BUN 19 03/23/2022   GFR 47.43 (L) 12/23/2020   GFRNONAA >60 02/01/2022   GFRAA 57 (L) 02/21/2020   NA 139 03/23/2022   K 4.7 03/23/2022   CALCIUM 9.9 03/23/2022   CO2 22 03/23/2022     [facility] Pharmacist Assistant [phone]

## 2022-05-19 ENCOUNTER — Ambulatory Visit (INDEPENDENT_AMBULATORY_CARE_PROVIDER_SITE_OTHER): Payer: Medicare Other | Admitting: Family Medicine

## 2022-05-22 ENCOUNTER — Ambulatory Visit (HOSPITAL_BASED_OUTPATIENT_CLINIC_OR_DEPARTMENT_OTHER)
Admission: RE | Admit: 2022-05-22 | Discharge: 2022-05-22 | Disposition: A | Discharge status: Home / Self Care | Payer: 59 | Source: Ambulatory Visit | Attending: Gastroenterology | Admitting: Gastroenterology

## 2022-05-22 DIAGNOSIS — K2970 Gastritis, unspecified, without bleeding: Secondary | ICD-10-CM

## 2022-05-22 DIAGNOSIS — R110 Nausea: Secondary | ICD-10-CM

## 2022-05-22 DIAGNOSIS — K2990 Gastroduodenitis, unspecified, without bleeding: Secondary | ICD-10-CM | POA: Insufficient documentation

## 2022-05-22 DIAGNOSIS — R109 Unspecified abdominal pain: Secondary | ICD-10-CM | POA: Diagnosis not present

## 2022-05-22 DIAGNOSIS — R1013 Epigastric pain: Secondary | ICD-10-CM

## 2022-05-22 DIAGNOSIS — I700 Atherosclerosis of aorta: Secondary | ICD-10-CM | POA: Diagnosis not present

## 2022-05-22 MED ORDER — IOHEXOL 300 MG/ML  SOLN
100.0000 mL | Freq: Once | INTRAMUSCULAR | Status: AC | PRN
  Administered 2022-05-22: 100 mL via INTRAVENOUS

## 2022-05-24 ENCOUNTER — Ambulatory Visit (INDEPENDENT_AMBULATORY_CARE_PROVIDER_SITE_OTHER): Payer: Medicare Other | Admitting: Family Medicine

## 2022-05-27 ENCOUNTER — Other Ambulatory Visit: Payer: Self-pay

## 2022-05-27 DIAGNOSIS — K6389 Other specified diseases of intestine: Secondary | ICD-10-CM

## 2022-06-10 ENCOUNTER — Telehealth: Payer: Self-pay

## 2022-06-10 ENCOUNTER — Encounter (HOSPITAL_COMMUNITY): Payer: Self-pay

## 2022-06-10 ENCOUNTER — Ambulatory Visit (HOSPITAL_COMMUNITY)
Admission: RE | Admit: 2022-06-10 | Discharge: 2022-06-10 | Disposition: A | Discharge status: Home / Self Care | Payer: 59 | Source: Ambulatory Visit | Attending: Gastroenterology | Admitting: Gastroenterology

## 2022-06-10 DIAGNOSIS — K6389 Other specified diseases of intestine: Secondary | ICD-10-CM

## 2022-06-10 NOTE — Telephone Encounter (Signed)
Received a call from MRI at [facility]. The patient is there for her MRI of the abdomen. Dr Orlene Plum recommends not doing the MRI to view the mesenteric mass. He said it will not add to the information and only increase the patient's medical bills. No benefit from this imaging. Patient has been sent home.  Dr Rush Landmark notified. No new orders given.

## 2022-06-14 ENCOUNTER — Telehealth: Payer: Self-pay | Admitting: Gastroenterology

## 2022-06-14 ENCOUNTER — Telehealth: Payer: Self-pay

## 2022-06-14 NOTE — Telephone Encounter (Signed)
Still awaiting follow-up from our hepatobiliary surgery team. Thanks. GM

## 2022-06-14 NOTE — Telephone Encounter (Signed)
Returned call to patient. Had to leave message.  

## 2022-06-14 NOTE — Telephone Encounter (Signed)
-----   Message from Irving Copas., MD sent at 06/11/2022  4:56 PM EDT ----- Regarding: Follow-up on patient we had discussed previously FB and SA, Hope you are both well. I had reached out to you guys about this patient a few weeks back about this mesenteric mass (not amenable to IR guided biopsy).  We were going to await an MRI abdomen, but radiology canceled the MRI saying that it was not needed even though it was recommended by radiology.  When we had last discussed this patient, you all thought that it could be removed surgically.  If you all are still willing to move forward with that, then I will place a referral to you all to see her and meet her. Please let me know your thoughts and we can move forward with getting the referral placed. Thanks. GM  Chauncey Reading while we await our surgical colleagues input. Please make this a documented note in the encounters. Thanks. GM

## 2022-06-14 NOTE — Telephone Encounter (Signed)
Inbound call from patient she wanted to speak with a nurse regarding a MRI at [facility] she wants to know what's going on because the appt was cancel and she dont understand why..please advise

## 2022-06-15 NOTE — Telephone Encounter (Signed)
Patient was informed why the MRI was canceled and she will be contact by the surgeon office. [facility]- Schlusser. Pt voiced understanding.

## 2022-06-16 ENCOUNTER — Telehealth: Payer: Self-pay

## 2022-06-16 ENCOUNTER — Other Ambulatory Visit: Payer: Self-pay

## 2022-06-16 ENCOUNTER — Other Ambulatory Visit: Payer: Self-pay | Admitting: Internal Medicine

## 2022-06-16 DIAGNOSIS — E2839 Other primary ovarian failure: Secondary | ICD-10-CM

## 2022-06-16 MED ORDER — CETIRIZINE HCL 10 MG PO TABS: 10.0000 mg | ORAL_TABLET | Freq: Every day | ORAL | 2 refills | Status: DC

## 2022-06-16 MED ORDER — ONDANSETRON HCL 4 MG PO TABS: 4.0000 mg | ORAL_TABLET | Freq: Three times a day (TID) | ORAL | 0 refills | Status: DC | PRN

## 2022-06-16 NOTE — Progress Notes (Signed)
Care Management & Coordination Services Pharmacy Team  Reason for Encounter: Medication coordination and delivery  Contacted patient to discuss medications and coordinate delivery from [facility]. Spoke with patient on 06/16/2022  Cycle dispensing form sent to Leslie Davis for review.   Last adherence delivery date: 05-28-2022  Patient is due for next adherence delivery on: 06-28-2022  This delivery to include: Vials  30 Days  Farxiga 10mg  1 tablet daily Edarbi 80 mg- 1 tablet daily  Fergon 324 mg  2 times a day  Ozempic 2 mg weekly  Amlodipine 10 mg daily Rosuvastatin 20 mg daily Carvedilol 3.125 mg twice daily Pantoprazole 40 mg daily  Aspirin 81 mg daily Cetirizine 10 mg daily Zetia 10 mg daily  Temazepam 30 mg at night PRN Zofran 4 mg every 4 hours PRN- add to delivery refill requst sent  Patient declined the following medications this month: Celebrex- Plenty supply  [name]- Plenty supply Albuterol- [facility]ills [facility] Tramadol- completed Sucralfate- filled at [facility] Nitrostat- plenty supply  Refills requested from providers include: Cetirizine   Confirmed delivery date of 06-28-2022, advised patient that pharmacy will contact them the morning of delivery.   Any concerns about your medications? No  How often do you forget or accidentally miss a dose? Never  Do you use a pillbox? Yes  Is patient in packaging No   Recent blood pressure readings are as follows:  03-25 154/77, 03-19 160/81 Recent blood glucose readings are as follows: 03-27 128, 03-26 110   Chart review: Recent office visits:  None  Recent consult visits:  05-22-2022 Mansouraty, Telford Nab., MD. CT of abdomen pelvis completed   Hospital visits:  None in previous 6 months  Medications: Outpatient Encounter Medications as of 06/16/2022  Medication Sig   albuterol (PROAIR HFA) 108 (90 Base) MCG/ACT inhaler    amLODipine (NORVASC) 10 MG tablet TAKE ONE TABLET BY MOUTH ONCE DAILY   aspirin  EC 81 MG tablet Take 1 tablet (81 mg total) by mouth daily. Swallow whole.   benzonatate (TESSALON PERLES) 100 MG capsule Take 1 capsule (100 mg total) by mouth 3 (three) times daily as needed for cough.   Blood Glucose Monitoring Suppl (ONETOUCH VERIO) w/Device KIT Use as directed to check blood sugars 2 times per day dx: e11.65   carvedilol (COREG) 3.125 MG tablet TAKE ONE TABLET BY MOUTH TWICE DAILY WITH A MEAL   celecoxib (CELEBREX) 200 MG capsule Take 1 capsule (200 mg total) by mouth 2 (two) times daily as needed.   cetirizine (ZYRTEC) 10 MG tablet Take 1 tablet (10 mg total) by mouth daily.   EDARBI 80 MG TABS TAKE ONE TABLET BY MOUTH ONCE DAILY   ezetimibe (ZETIA) 10 MG tablet Take 1 tablet (10 mg total) by mouth daily.   FARXIGA 10 MG TABS tablet TAKE ONE TABLET BY MOUTH ONCE DAILY BEFORE BREAKFAST   ferrous gluconate (FERGON) 324 MG tablet TAKE 1 TABLET BY MOUTH TWICE DAILY WITH A MEAL. (Patient taking differently: Take 324 mg by mouth 2 (two) times daily with a meal.)   glucose blood (ONETOUCH VERIO) test strip USE TO check blood glucose TWICE DAILY AS DIRECTED   guaiFENesin-codeine (ROBITUSSIN AC) 100-10 MG/5ML syrup Take 5 mLs by mouth 3 (three) times daily as needed for cough.   linaclotide ([name]) 145 MCG CAPS capsule Take 1 capsule (145 mcg total) by mouth daily.   nitroGLYCERIN (NITROSTAT) 0.4 MG SL tablet Place 1 tablet (0.4 mg total) under the tongue every 5 (five) minutes  as needed for chest pain. If you require more than two tablets five minutes apart go to the nearest ER via EMS.   ondansetron (ZOFRAN) 4 MG tablet Take 1 tablet (4 mg total) by mouth every 8 (eight) hours as needed for nausea or vomiting.   OneTouch Delica Lancets 99991111 MISC Use as directed to check blood sugars 2 times per day dx:e11.65   OZEMPIC, 2 MG/DOSE, 8 MG/3ML SOPN Inject 2mg  into THE SKIN ONCE WEEKLY   pantoprazole (PROTONIX) 40 MG tablet Take 1 tablet (40 mg total) by mouth 2 (two) times daily.    rosuvastatin (CRESTOR) 20 MG tablet TAKE ONE TABLET BY MOUTH ONCE DAILY   sucralfate (CARAFATE) 1 g tablet Take 1 tablet (1 g total) by mouth 4 (four) times daily -  with meals and at bedtime.   temazepam (RESTORIL) 30 MG capsule TAKE ONE CAPSULE BY MOUTH AT bedtime AS NEEDED   traMADol (ULTRAM) 50 MG tablet Take 1 tablet (50 mg total) by mouth every 12 (twelve) hours as needed.   TRUEPLUS PEN NEEDLES 31G X 6 MM MISC USE WITH pen TO INJECT insulin DAILY   No facility-administered encounter medications on file as of 06/16/2022.   BP Readings from Last 3 Encounters:  04/20/22 130/78  04/07/22 (!) 142/64  04/05/22 (!) 140/70    Pulse Readings from Last 3 Encounters:  04/20/22 (!) 59  04/07/22 (!) 52  04/05/22 (!) 53    Lab Results  Component Value Date/Time   HGBA1C 6.3 (H) 03/23/2022 10:32 AM   HGBA1C 6.1 (H) 12/02/2021 10:56 AM   Lab Results  Component Value Date   CREATININE 0.98 03/23/2022   BUN 19 03/23/2022   GFR 47.43 (L) 12/23/2020   GFRNONAA >60 02/01/2022   GFRAA 57 (L) 02/21/2020   NA 139 03/23/2022   K 4.7 03/23/2022   CALCIUM 9.9 03/23/2022   CO2 22 03/23/2022     [facility] Pharmacist Assistant [phone]

## 2022-06-17 ENCOUNTER — Ambulatory Visit: Payer: Self-pay | Admitting: Surgery

## 2022-06-17 DIAGNOSIS — K6389 Other specified diseases of intestine: Secondary | ICD-10-CM | POA: Diagnosis not present

## 2022-06-17 NOTE — H&P (Signed)
History of Present Illness: Leslie Davis is a 67 y.o. female who was referred to me for evaluation of a mesenteric mass. She has a history of GERD and peptic ulcers, for which she is followed by GI (Dr. Rush Landmark) and underwent an EGD on 04/07/22. Biopsies of her gastric ulcers at that time were benign and negative for H pylori. She was treated with a PPI and carafate but has continued to have abdominal pain, so a CT scan was completed for further workup. This showed a 3cm mass in the small bowel mesentery, suspicious for a GIST vs carcinoid vs desmoid tumor. She was referred to discuss surgical resection.     She has a history of CAD and had a LHC in May 2023, which showed mid-LAD stenosis. At that time she was not having anginal symptoms and opted for medical therapy. She is on aspirin 81mg  daily. She denies any chest pain or shortness of breath with activity.   Previous abdominal surgeries include a cholecystectomy. She reports one of her aunts had gastric cancer, but she has no other known family history of cancer.     Review of Systems: A complete review of systems was obtained from the patient.  I have reviewed this information and discussed as appropriate with the patient.  See HPI as well for other ROS.       Medical History: Past Medical History Past Medical History: Diagnosis Date  Anemia    Arthritis    Coronary artery disease    Diabetes mellitus without complication (CMS-HCC)    GERD (gastroesophageal reflux disease)    Hyperlipidemia    Hypertension        Patient Active Problem List Diagnosis  Intraductal papilloma of breast, left     Past Surgical History Past Surgical History: Procedure Laterality Date  ARTHROPLASTY TOTAL KNEE Left 10/27/2020  Left heart cath and coronary angiography N/A 08/18/2021  Breast lumpectomy with radioactive seed localization Left 02/05/2022   Dr Marlou Starks  CHOLECYSTECTOMY      LAPAROSCOPIC TUBAL LIGATION          Allergies No  Known Allergies    Current Outpatient Medications on File Prior to Visit Medication Sig Dispense Refill  amLODIPine (NORVASC) 10 MG tablet Take 1 tablet by mouth once daily      aspirin 81 MG EC tablet Take by mouth      carvediloL (COREG) 3.125 MG tablet Take by mouth      celecoxib (CELEBREX) 200 MG capsule Take by mouth      cetirizine (ZYRTEC) 10 MG tablet Take 1 tablet by mouth once daily      dapagliflozin propanediol (FARXIGA) 10 mg tablet Take by mouth      ferrous sulfate 325 (65 FE) MG EC tablet Take 325 mg by mouth daily with breakfast      linaCLOtide ([name]) 145 mcg capsule Take by mouth      nitroGLYcerin (NITROSTAT) 0.4 MG SL tablet Place under the tongue      OZEMPIC 1 mg/dose (4 mg/3 mL) pen injector Inject 1 mg into the skin once a week.      polyethylene glycol (MIRALAX) packet Take by mouth      rosuvastatin (CRESTOR) 20 MG tablet Take 1 tablet by mouth once daily      sennosides 15 mg Chew Take by mouth      temazepam (RESTORIL) 30 mg capsule TAKE ONE CAPSULE BY MOUTH EVERYDAY AT BEDTIME AS NEEDED       No current  facility-administered medications on file prior to visit.     Family History History reviewed. No pertinent family history.     Social History   Tobacco Use Smoking Status Never Smokeless Tobacco Never     Social History Social History    Socioeconomic History  Marital status: Married Tobacco Use  Smoking status: Never  Smokeless tobacco: Never Substance and Sexual Activity  Alcohol use: Not Currently  Drug use: Never      Objective:     Vitals:   06/17/22 0948 06/17/22 0949 BP: (!) 198/77   Pulse: 56   Temp: 36.8 C (98.3 F)   SpO2: 99%   Weight: 82 kg (180 lb 12.8 oz)   Height: 162.6 cm (5\' 4" )   PainSc:   0-No pain   Body mass index is 31.03 kg/m.   Physical Exam Vitals reviewed.  Constitutional:      General: She is not in acute distress.    Appearance: Normal appearance.  HENT:     Head: Normocephalic and  atraumatic.  Eyes:     General: No scleral icterus.    Conjunctiva/sclera: Conjunctivae normal.  Cardiovascular:     Rate and Rhythm: Normal rate and regular rhythm.  Pulmonary:     Effort: Pulmonary effort is normal. No respiratory distress.     Breath sounds: Normal breath sounds. No wheezing.  Abdominal:     General: There is no distension.     Palpations: Abdomen is soft.     Tenderness: There is no abdominal tenderness.  Musculoskeletal:        General: Normal range of motion.  Skin:    General: Skin is warm and dry.     Coloration: Skin is not jaundiced.  Neurological:     General: No focal deficit present.     Mental Status: She is alert and oriented to person, place, and time.            Assessment and Plan: Diagnoses and all orders for this visit:   Mesenteric mass       This is a 67 yo female referred for evaluation of a mesenteric mass, diagnosed incidentally on workup for abdominal pain. I have personally reviewed her labs, imaging and referral notes. She has a 3.5cm mass in the right mid-abdomen within the small bowel mesentery, well-circumscribed and away from the central mesenteric vessels. This appears resectable. This was previously reviewed with IR and there is no safe window for percutaneous biopsy. I recommended proceeding with surgical resection, which may will likely also require a small bowel resection. I discussed a laparoscopic-assisted resection, as well as the possibility of a laparotomy. I reviewed the benefits and risks of bleeding, infection, anastomotic leak, and cardiac event, as well as the expected postoperative hospitalization and recovery time. Will request preoperative cardiac risk stratification given her history of coronary artery disease. She does NOT need to hold aspirin for surgery. She will be contacted to schedule an elective surgery date.  Michaelle Birks, MD [facility] General, Hepatobiliary and Pancreatic Surgery 06/17/22  2:37 PM

## 2022-06-21 ENCOUNTER — Other Ambulatory Visit: Payer: 59

## 2022-06-21 ENCOUNTER — Ambulatory Visit (INDEPENDENT_AMBULATORY_CARE_PROVIDER_SITE_OTHER): Payer: Medicare Other | Admitting: Family Medicine

## 2022-06-21 ENCOUNTER — Inpatient Hospital Stay: Admission: RE | Admit: 2022-06-21 | Payer: 59 | Source: Ambulatory Visit

## 2022-06-23 NOTE — Progress Notes (Unsigned)
Office Visit Note   Patient: Leslie Davis           Date of Birth: [birth year]           MRN: [id number] Visit Date: 06/24/2022              Requested by: [name], [address] [place],  [place] [address] PCP: [name], MD   Assessment & Plan: Visit Diagnoses:  1. Status post total left knee replacement     Plan: Impression is painful left total knee replacement.  We will order bone scan and inflammatory markers to rule out infection and/or loosening.  Follow-up after the studies.  Follow-Up Instructions: No follow-ups on file.   Orders:  Orders Placed This Encounter  Procedures   XR Knee 1-2 Views Left   No orders of the defined types were placed in this encounter.     Procedures: No procedures performed   Clinical Data: No additional findings.   Subjective: Chief Complaint  Patient presents with   Left Knee - Follow-up    History of Left total knee arthroplasty 10/27/2020    HPI  [name] comes in today for chronic left knee pain.  She is status post left total knee replacement on 10/27/2020.  States that it hurts all the time.  The pain is significantly better than what she was living with prior to the knee replacement.  She states that the pain is worse with standing or walking.  She is doing home exercises.  She states the pain is constant not just with activity or weightbearing.  Review of Systems   Objective: Vital Signs: There were no vitals taken for this visit.  Physical Exam  Ortho Exam  Examination of the left knee shows fully healed surgical scar.  No signs of infection.  No joint effusion.  No pain with passive range of motion.  Good varus valgus stability.  Specialty Comments:  No specialty comments available.  Imaging: XR Knee 1-2 Views Left  Result Date: 06/24/2022 Stable total knee replacement in good alignment.  No loosening.    PMFS History: Patient Active Problem List   Diagnosis Date Noted    Acute cough 05/02/2022   Nasal congestion 05/02/2022   Type 2 diabetes mellitus with obesity 03/24/2022   Class 1 obesity with serious comorbidity and body mass index (BMI) of 33.0 to 33.9 in adult 03/24/2022   Atherosclerosis of aorta 03/23/2022   Coronary artery disease of native artery of native heart with stable angina pectoris    Other constipation 05/25/2021   Status post total left knee replacement 10/27/2020   Primary osteoarthritis of left knee    NAFLD (nonalcoholic fatty liver disease) 08/06/2020   Other fatigue 07/23/2020   SOBOE (shortness of breath on exertion) 07/23/2020   Diabetes mellitus 07/23/2020   Hypertension associated with type 2 diabetes mellitus 07/23/2020   Hyperlipidemia associated with type 2 diabetes mellitus 07/23/2020   Absolute anemia 07/23/2020   Vitamin D deficiency 07/23/2020   At risk for heart disease 07/23/2020   AKI (acute kidney injury) 09/07/2019   Acute kidney injury 09/06/2019   Bradycardia 09/06/2019   Hyperglycemia 09/06/2019   HLD (hyperlipidemia)    Hypertensive nephropathy 05/16/2019   Erosive gastritis 11/16/2017   Fatty liver 11/16/2017   Abdominal pain 12/11/2012   Early satiety 12/11/2012   Hiatal hernia    Hx of cholecystectomy 02/05/2011   Flu-like symptoms 02/05/2011   CONSTIPATION 08/27/2008   FATTY LIVER DISEASE  05/07/2008   Nausea with vomiting 04/23/2008   EPIGASTRIC PAIN 04/23/2008   RENAL CALCULUS 04/22/2008   Type 2 diabetes mellitus with stage 2 chronic kidney disease, with long-term current use of insulin 05/19/2006   Type 2 diabetes mellitus with hyperlipidemia 05/19/2006   HYPERTENSION, BENIGN SYSTEMIC 05/19/2006   GASTROESOPHAGEAL REFLUX, NO ESOPHAGITIS 05/19/2006   AMENORRHEA 05/19/2006   Past Medical History:  Diagnosis Date   CKD (chronic kidney disease)    stage 3   Coronary artery disease 07/2021   obstructive CAD-70% LAD-treated medically   DM (diabetes mellitus)    Fatty liver disease,  nonalcoholic    Gastritis    Gastroparesis    GERD (gastroesophageal reflux disease)    Heartburn    Hiatal hernia    HLD (hyperlipidemia)    HTN (hypertension)    Joint pain    Renal calculus    Rheumatoid arthritis    Vitamin D deficiency     Family History  Problem Relation Age of Onset   Diabetes Mother    Thyroid disease Mother    Kidney disease Mother    Hypertension Father    Hyperlipidemia Father    Sudden death Father    Stroke Father    Diabetes Sister    Heart disease Sister    Kidney disease Sister    COPD Sister    Heart disease Brother 28   Diabetes Brother    Stomach cancer Maternal Aunt        X 2 aunts   Diabetes Maternal Grandmother    Colon cancer Neg Hx    Esophageal cancer Neg Hx    Rectal cancer Neg Hx    Colon polyps Neg Hx     Past Surgical History:  Procedure Laterality Date   BREAST BIOPSY  02/04/2022   MM LT RADIOACTIVE SEED EA ADD LESION LOC MAMMO GUIDE 02/04/2022 [facility]   BREAST BIOPSY  02/04/2022   MM LT RADIOACTIVE SEED LOC MAMMO GUIDE 02/04/2022 [facility]   BREAST LUMPECTOMY WITH RADIOACTIVE SEED LOCALIZATION Left 02/05/2022   Procedure: LEFT BREAST BRACKETED LUMPECTOMY WITH RADIOACTIVE SEED LOCALIZATION;  Surgeon: Jovita Kussmaul, MD;  Location: [facility];  Service: General;  Laterality: Left;   CHOLECYSTECTOMY     COLONOSCOPY  09/10/2011   normal   JOINT REPLACEMENT  2022   left TKA   LEFT HEART CATH AND CORONARY ANGIOGRAPHY N/A 08/18/2021   Procedure: LEFT HEART CATH AND CORONARY ANGIOGRAPHY;  Surgeon: Nigel Mormon, MD;  Location: [facility];  Service: Cardiovascular;  Laterality: N/A;   TOTAL KNEE ARTHROPLASTY Left 10/27/2020   Procedure: LEFT TOTAL KNEE ARTHROPLASTY;  Surgeon: [name], MD;  Location: [facility];  Service: Orthopedics;  Laterality: Left;   TUBAL LIGATION     UPPER GASTROINTESTINAL ENDOSCOPY     Social History   Occupational History   Occupation:  Housekeeping    Comment: Estate manager/land agent  Tobacco Use   Smoking status: Former    Packs/day: 0.25    Years: 1.00    Additional pack years: 0.00    Total pack years: 0.25    Types: Cigarettes    Quit date: 09/10/1974    Years since quitting: 47.8   Smokeless tobacco: Never   Tobacco comments:    she no longer smokes.   Vaping Use   Vaping Use: Never used  Substance and Sexual Activity   Alcohol use: Never   Drug use: Never   Sexual activity:  Not Currently    Birth control/protection: Post-menopausal

## 2022-06-24 ENCOUNTER — Encounter: Payer: Self-pay | Admitting: Orthopaedic Surgery

## 2022-06-24 ENCOUNTER — Other Ambulatory Visit (INDEPENDENT_AMBULATORY_CARE_PROVIDER_SITE_OTHER): Payer: 59

## 2022-06-24 ENCOUNTER — Ambulatory Visit (INDEPENDENT_AMBULATORY_CARE_PROVIDER_SITE_OTHER): Payer: 59 | Admitting: Orthopaedic Surgery

## 2022-06-24 DIAGNOSIS — Z96652 Presence of left artificial knee joint: Secondary | ICD-10-CM

## 2022-06-25 LAB — CBC WITH DIFFERENTIAL/PLATELET
Absolute Monocytes: 446 cells/uL (ref 200–950)
Basophils Absolute: 40 cells/uL (ref 0–200)
Basophils Relative: 1.1 %
Eosinophils Absolute: 212 cells/uL (ref 15–500)
Eosinophils Relative: 5.9 %
HCT: 39.4 % (ref 35.0–45.0)
Hemoglobin: 13.3 g/dL (ref 11.7–15.5)
Lymphs Abs: 1296 cells/uL (ref 850–3900)
MCH: 28.5 pg (ref 27.0–33.0)
MCHC: 33.8 g/dL (ref 32.0–36.0)
MCV: 84.5 fL (ref 80.0–100.0)
MPV: 12.8 fL — ABNORMAL HIGH (ref 7.5–12.5)
Monocytes Relative: 12.4 %
Neutro Abs: 1606 cells/uL (ref 1500–7800)
Neutrophils Relative %: 44.6 %
Platelets: 220 10*3/uL (ref 140–400)
RBC: 4.66 10*6/uL (ref 3.80–5.10)
RDW: 13.4 % (ref 11.0–15.0)
Total Lymphocyte: 36 %
WBC: 3.6 10*3/uL — ABNORMAL LOW (ref 3.8–10.8)

## 2022-06-25 LAB — SEDIMENTATION RATE: Sed Rate: 14 mm/h (ref 0–30)

## 2022-06-25 LAB — C-REACTIVE PROTEIN: CRP: 2.5 mg/L (ref <8.0)

## 2022-06-29 ENCOUNTER — Other Ambulatory Visit: Payer: Self-pay | Admitting: Internal Medicine

## 2022-06-29 ENCOUNTER — Telehealth: Payer: Self-pay | Admitting: *Deleted

## 2022-06-29 NOTE — Telephone Encounter (Signed)
Returned call to patient. Patient was suppose to be contacted by [facility] to schedule an appointment. Patient advised to call their office to schedule appt. Patient was given office phone number. See telephone encounter message 3/25 from Dr.Byerly.

## 2022-06-29 NOTE — Telephone Encounter (Signed)
   Pre-operative Risk Assessment    Patient Name: Leslie Davis  DOB: November 28, 1955 MRN: [id number]      Request for Surgical Clearance    Procedure:   EXCISION OF MESENTERIC MASS, POSSIBLE SMALL BOWEL RESECTION  Date of Surgery:  Clearance TBD                                 Surgeon:  DR. Sophronia Simas Surgeon's Group or Practice Name:  [facility] Phone number:  [phone] Fax number:  [phone] ATTN: Campbell Stall, CMA   Type of Clearance Requested:   - Medical : You do NOT need to stop taking your aspirin for surgery, and should continue taking this as you normally do.    Type of Anesthesia:  General    Additional requests/questions:    Elpidio Anis   06/29/2022, 5:37 PM

## 2022-06-29 NOTE — Telephone Encounter (Signed)
Patient called to follow up on message below. Has not heard from anyone about next steps. Please advise.

## 2022-06-29 NOTE — Telephone Encounter (Signed)
See previous note. Patient needs to contact [facility] to schedule. Patient was given phone number for [facility].

## 2022-06-30 ENCOUNTER — Encounter: Payer: Medicare Other | Admitting: Internal Medicine

## 2022-06-30 NOTE — Progress Notes (Signed)
No show

## 2022-06-30 NOTE — Telephone Encounter (Signed)
Preop Team,   This patient appears to be a [facility] patient. She saw Dr. Antoine Poche in April 2022 but has since established with Dr. Odis Hollingshead. Please let requesting office know request should be sent to primary cardiologist at [facility].   Thank you!  DW

## 2022-06-30 NOTE — Telephone Encounter (Signed)
Called the requesting office [facility] and spoke with CJ one of the surgery schedulers and informed him that their office would need to send the cardiac request to Dr. Odis Hollingshead at [facility] who the patient is currently seeing for cardiology.

## 2022-07-01 ENCOUNTER — Encounter (HOSPITAL_COMMUNITY)
Admission: RE | Admit: 2022-07-01 | Discharge: 2022-07-01 | Disposition: A | Discharge status: Home / Self Care | Payer: 59 | Source: Ambulatory Visit | Attending: Orthopaedic Surgery | Admitting: Orthopaedic Surgery

## 2022-07-01 ENCOUNTER — Ambulatory Visit (HOSPITAL_COMMUNITY)
Admission: RE | Admit: 2022-07-01 | Discharge: 2022-07-01 | Disposition: A | Discharge status: Home / Self Care | Payer: 59 | Source: Ambulatory Visit | Attending: Orthopaedic Surgery | Admitting: Orthopaedic Surgery

## 2022-07-01 ENCOUNTER — Ambulatory Visit
Admission: RE | Admit: 2022-07-01 | Discharge: 2022-07-01 | Disposition: A | Discharge status: Home / Self Care | Payer: 59 | Source: Ambulatory Visit | Attending: Internal Medicine | Admitting: Internal Medicine

## 2022-07-01 DIAGNOSIS — Z96652 Presence of left artificial knee joint: Secondary | ICD-10-CM | POA: Insufficient documentation

## 2022-07-01 DIAGNOSIS — Z780 Asymptomatic menopausal state: Secondary | ICD-10-CM | POA: Diagnosis not present

## 2022-07-01 DIAGNOSIS — E2839 Other primary ovarian failure: Secondary | ICD-10-CM

## 2022-07-01 DIAGNOSIS — M85852 Other specified disorders of bone density and structure, left thigh: Secondary | ICD-10-CM | POA: Diagnosis not present

## 2022-07-01 MED ORDER — TECHNETIUM TC 99M MEDRONATE IV KIT
20.0000 | PACK | Freq: Once | INTRAVENOUS | Status: AC | PRN
  Administered 2022-07-01: 20 via INTRAVENOUS

## 2022-07-02 ENCOUNTER — Telehealth: Payer: Self-pay | Admitting: Orthopaedic Surgery

## 2022-07-02 NOTE — Telephone Encounter (Signed)
Called pt 1X and unable to leave a message no voicemail set up. Pt need and Bone Scan/MRI revie with Dr Roda Shutters after 4/11

## 2022-07-05 ENCOUNTER — Encounter: Payer: Self-pay | Admitting: Cardiology

## 2022-07-05 ENCOUNTER — Ambulatory Visit: Payer: 59 | Admitting: Cardiology

## 2022-07-05 ENCOUNTER — Encounter: Payer: Self-pay | Admitting: Internal Medicine

## 2022-07-05 ENCOUNTER — Ambulatory Visit (INDEPENDENT_AMBULATORY_CARE_PROVIDER_SITE_OTHER): Payer: 59 | Admitting: Internal Medicine

## 2022-07-05 VITALS — BP 140/78 | HR 67 | Resp 18 | Ht 62.0 in | Wt 182.8 lb

## 2022-07-05 VITALS — BP 138/80 | HR 52 | Temp 97.5°F | Ht 62.0 in | Wt 181.2 lb

## 2022-07-05 DIAGNOSIS — Z01810 Encounter for preprocedural cardiovascular examination: Secondary | ICD-10-CM | POA: Diagnosis not present

## 2022-07-05 DIAGNOSIS — E1122 Type 2 diabetes mellitus with diabetic chronic kidney disease: Secondary | ICD-10-CM | POA: Diagnosis not present

## 2022-07-05 DIAGNOSIS — K6389 Other specified diseases of intestine: Secondary | ICD-10-CM

## 2022-07-05 DIAGNOSIS — I1310 Hypertensive heart and chronic kidney disease without heart failure, with stage 1 through stage 4 chronic kidney disease, or unspecified chronic kidney disease: Secondary | ICD-10-CM

## 2022-07-05 DIAGNOSIS — Z794 Long term (current) use of insulin: Secondary | ICD-10-CM

## 2022-07-05 DIAGNOSIS — M25562 Pain in left knee: Secondary | ICD-10-CM

## 2022-07-05 DIAGNOSIS — I2510 Atherosclerotic heart disease of native coronary artery without angina pectoris: Secondary | ICD-10-CM | POA: Diagnosis not present

## 2022-07-05 DIAGNOSIS — M85852 Other specified disorders of bone density and structure, left thigh: Secondary | ICD-10-CM | POA: Diagnosis not present

## 2022-07-05 DIAGNOSIS — Z6833 Body mass index (BMI) 33.0-33.9, adult: Secondary | ICD-10-CM

## 2022-07-05 DIAGNOSIS — G8929 Other chronic pain: Secondary | ICD-10-CM | POA: Diagnosis not present

## 2022-07-05 DIAGNOSIS — N182 Chronic kidney disease, stage 2 (mild): Secondary | ICD-10-CM

## 2022-07-05 DIAGNOSIS — E1169 Type 2 diabetes mellitus with other specified complication: Secondary | ICD-10-CM

## 2022-07-05 DIAGNOSIS — E785 Hyperlipidemia, unspecified: Secondary | ICD-10-CM | POA: Diagnosis not present

## 2022-07-05 DIAGNOSIS — W19XXXA Unspecified fall, initial encounter: Secondary | ICD-10-CM

## 2022-07-05 DIAGNOSIS — E1159 Type 2 diabetes mellitus with other circulatory complications: Secondary | ICD-10-CM

## 2022-07-05 DIAGNOSIS — I700 Atherosclerosis of aorta: Secondary | ICD-10-CM | POA: Diagnosis not present

## 2022-07-05 DIAGNOSIS — E6609 Other obesity due to excess calories: Secondary | ICD-10-CM

## 2022-07-05 DIAGNOSIS — I10 Essential (primary) hypertension: Secondary | ICD-10-CM

## 2022-07-05 DIAGNOSIS — Z2821 Immunization not carried out because of patient refusal: Secondary | ICD-10-CM | POA: Diagnosis not present

## 2022-07-05 DIAGNOSIS — I2584 Coronary atherosclerosis due to calcified coronary lesion: Secondary | ICD-10-CM | POA: Diagnosis not present

## 2022-07-05 NOTE — Patient Instructions (Signed)

## 2022-07-05 NOTE — Progress Notes (Signed)
ID:  Leslie Davis, DOB 07-13-1955, MRN [id number]  PCP:  Dorothyann Peng, MD  Cardiologist:  Tessa Lerner, DO, [facility] (established care July 09, 2021) Former Cardiology Providers: Dr. Rollene Rotunda.   Date: 07/05/22 Last Office Visit: 02/23/2022  Chief Complaint  Patient presents with   Surgical clearance    HPI  Leslie Davis is a 67 y.o. African-American female whose past medical history and cardiovascular risk factors include: CAD, Coronary artery calcification, aortic atherosclerosis, hypertension, hyperlipidemia, GERD, fatty liver disease, DM Type II, nephrolithiasis, advanced age.  Patient was referred to the practice in April 2023 for evaluation of shortness of breath and chest tightness.  Her ischemic workup included a coronary CTA in May 2023 which noted disease in the LAD.  She underwent left heart catheterization thereafter which we illustrated similar findings.  However with up titration of antianginal therapy and her becoming asymptomatic the shared decision was to treated medically.  Since then patient has been on antianginal therapy and has not had any reoccurrence of chest pain.  She presents today for preoperative risk stratification for excision of mesenteric mass, possible small bowel resection with Dr. Sophronia Simas.  Clinically she denies anginal chest pain or heart failure symptoms.  No use of sublingual nitroglycerin tablet.  She is no longer on insulin and her most recent A1c was 6.2 as of January 2024.  Her morning blood pressures at home 137/72 and otherwise remained stable on current regimen.  She currently walks 60 minutes at the gym without any limiting symptoms of chest pain.  ALLERGIES: Allergies  Allergen Reactions   Fiasp [Insulin Aspart (W-Niacinamide)] Hives    MEDICATION LIST PRIOR TO VISIT: No outpatient medications have been marked as taking for the 07/05/22 encounter (Office Visit) with Odis Hollingshead, Keyry Iracheta, DO.     PAST MEDICAL HISTORY: Past  Medical History:  Diagnosis Date   CKD (chronic kidney disease)    stage 3   Coronary artery disease 07/2021   obstructive CAD-70% LAD-treated medically   DM (diabetes mellitus)    Fatty liver disease, nonalcoholic    Gastritis    Gastroparesis    GERD (gastroesophageal reflux disease)    Heartburn    Hiatal hernia    HLD (hyperlipidemia)    HTN (hypertension)    Joint pain    Renal calculus    Rheumatoid arthritis    Vitamin D deficiency     PAST SURGICAL HISTORY: Past Surgical History:  Procedure Laterality Date   BREAST BIOPSY  02/04/2022   MM LT RADIOACTIVE SEED EA ADD LESION LOC MAMMO GUIDE 02/04/2022 [facility]   BREAST BIOPSY  02/04/2022   MM LT RADIOACTIVE SEED LOC MAMMO GUIDE 02/04/2022 [facility]   BREAST LUMPECTOMY WITH RADIOACTIVE SEED LOCALIZATION Left 02/05/2022   Procedure: LEFT BREAST BRACKETED LUMPECTOMY WITH RADIOACTIVE SEED LOCALIZATION;  Surgeon: Griselda Miner, MD;  Location: [facility];  Service: General;  Laterality: Left;   CHOLECYSTECTOMY     COLONOSCOPY  09/10/2011   normal   JOINT REPLACEMENT  2022   left TKA   LEFT HEART CATH AND CORONARY ANGIOGRAPHY N/A 08/18/2021   Procedure: LEFT HEART CATH AND CORONARY ANGIOGRAPHY;  Surgeon: Elder Negus, MD;  Location: [facility];  Service: Cardiovascular;  Laterality: N/A;   TOTAL KNEE ARTHROPLASTY Left 10/27/2020   Procedure: LEFT TOTAL KNEE ARTHROPLASTY;  Surgeon: Tarry Kos, MD;  Location: [facility];  Service: Orthopedics;  Laterality: Left;   TUBAL LIGATION  UPPER GASTROINTESTINAL ENDOSCOPY      FAMILY HISTORY: The patient family history includes COPD in her sister; Diabetes in her brother, maternal grandmother, mother, and sister; Heart disease in her sister; Heart disease (age of onset: 64) in her brother; Hyperlipidemia in her father; Hypertension in her father; Kidney disease in her mother and sister; Stomach cancer in her maternal aunt; Stroke in  her father; Sudden death in her father; Thyroid disease in her mother.  SOCIAL HISTORY:  The patient  reports that she quit smoking about 47 years ago. Her smoking use included cigarettes. She has a 0.25 pack-year smoking history. She has never used smokeless tobacco. She reports that she does not drink alcohol and does not use drugs.  REVIEW OF SYSTEMS: Review of Systems  Constitutional: Negative for malaise/fatigue.  Cardiovascular:  Negative for chest pain, dyspnea on exertion, leg swelling, near-syncope, orthopnea, palpitations, paroxysmal nocturnal dyspnea and syncope.  Respiratory:  Negative for shortness of breath.   Gastrointestinal:  Positive for abdominal pain (at times).    PHYSICAL EXAM:    07/05/2022    1:19 PM 07/05/2022    9:36 AM 07/05/2022    8:56 AM  Vitals with BMI  Height 5\' 2"   5\' 2"   Weight 182 lbs 13 oz  181 lbs 3 oz  BMI 33.43  33.13  Systolic 140 138 161  Diastolic 78 80 78  Pulse 67  52    Physical Exam  Neck: No JVD present.  Cardiovascular: Normal rate, regular rhythm, S1 normal, S2 normal, intact distal pulses and normal pulses. Exam reveals no gallop, no S3 and no S4.  No murmur heard. Pulses:      Dorsalis pedis pulses are 2+ on the right side and 2+ on the left side.       Posterior tibial pulses are 2+ on the right side and 2+ on the left side.  Pulmonary/Chest: Effort normal and breath sounds normal. No stridor. She has no wheezes. She has no rales. She exhibits no tenderness.  Abdominal: Soft. Bowel sounds are normal. She exhibits no distension. There is no abdominal tenderness.  Musculoskeletal:        General: No edema.    RADIOLOGY CTA PE protocol: June 25, 2021 1. No pulmonary embolus or acute intrathoracic abnormality. 2. Coronary artery calcifications. Aortic Atherosclerosis (ICD10-I70.0)  CARDIAC DATABASE: EKG: 02/23/2022: Normal sinus rhythm, 64 bpm, consider old anteroseptal infarct, without underlying injury pattern. July 05, 2022: Sinus bradycardia, 56 bpm, TWI in lateral leads consider possible ischemia, without underlying injury pattern.  Since last office visit rate is lower and TWI noticable.   Echocardiogram: 07/15/2021:  Normal LV systolic function with EF 63%. Left ventricle cavity is normal  in size. Mild concentric remodeling of the left ventricle. Normal global  wall motion. Normal diastolic filling pattern. Calculated EF 63%.  Left atrial cavity is mildly dilated by volume.  Structurally normal tricuspid valve.  Mild tricuspid regurgitation. No  evidence of pulmonary hypertension.   Stress Testing: 11/03/2018 [facility] available in Care Everywhere: Per report: No reversible ischemia or infarction, normal wall motion, calculated LVEF 84%, low risk study  CCTA: 07/22/2021 1. Total coronary calcium score of 341. This was 93rd percentile for age and sex matched control. 2. Normal coronary origin with co-dominance. 3. CAD-RADS = 3.  Left Main: Patent.   LAD: Mild stenosis (25-49%) at the proximal LAD due to calcified plaque. Moderate stenosis (50-69%) at mid LAD after the take off  of second diagonal branch due to mixed eccentric plaque (lesion is about 2 cm in length). Mild stenosis (25-49%) due to calcified plaque within the distal and apical LAD. First Diagonal branch is patent. Second diagonal branch patent with calcified plaque at the mid-segment.   LCx: Patent.   RCA: Moderate stenosis (50-69%) at the ostial RCA due to eccentric mixed plaque and remainder of the vessel is patent.   4. Aortic atherosclerosis.   5. Study is sent for CT-FFR to further evaluate the RCA and LAD disease. Findings will be performed and reported separately.  6. No acute or significant incidental extracardiac findings in the chest.  CTFFR  May 2023: CT FFR analysis illustrates hemodynamically significant stenosis within the mid LAD (distal to second diagonal  branch).  Heart Catheterization: 08/18/2021: LM: Normal LAD: Prox calcific 30% disease         Mid, focal, eccentric 70% stenosis Lcx: No significant disease RCA: Ostial 40% stenosis Patient opted medical therapy. In absence of any angina or angina equivalent symptoms on excellent medical therapy, reasonable to continue the same at that time.  In future, if symptoms increase, could consider PCI to mid LAD.   LABORATORY DATA:    Latest Ref Rng & Units 06/24/2022   10:20 AM 03/23/2022   10:32 AM 12/02/2021   10:56 AM  CBC  WBC 3.8 - 10.8 Thousand/uL 3.6  4.4  4.7   Hemoglobin 11.7 - 15.5 g/dL 16.1  09.6  04.5   Hematocrit 35.0 - 45.0 % 39.4  39.7  38.7   Platelets 140 - 400 Thousand/uL 220  211  268        Latest Ref Rng & Units 03/23/2022   10:32 AM 02/01/2022   12:39 PM 12/02/2021   10:56 AM  CMP  Glucose 70 - 99 mg/dL 94  90  92   BUN 8 - 27 mg/dL 19  24  21    Creatinine 0.57 - 1.00 mg/dL 4.09  8.11  9.14   Sodium 134 - 144 mmol/L 139  140  136   Potassium 3.5 - 5.2 mmol/L 4.7  4.7  4.7   Chloride 96 - 106 mmol/L 104  109  104   CO2 20 - 29 mmol/L 22  24  19    Calcium 8.7 - 10.3 mg/dL 9.9  9.3  9.5   Total Protein 6.0 - 8.5 g/dL 7.0   6.8   Total Bilirubin 0.0 - 1.2 mg/dL 0.3   <7.8   Alkaline Phos 44 - 121 IU/L 114   127   AST 0 - 40 IU/L 40   19   ALT 0 - 32 IU/L 54   24     Lipid Panel  Lab Results  Component Value Date   CHOL 159 12/02/2021   HDL 85 12/02/2021   LDLCALC 62 12/02/2021   TRIG 57 12/02/2021   CHOLHDL 1.9 12/02/2021     No components found for: "NTPROBNP" No results for input(s): "PROBNP" in the last 8760 hours. No results for input(s): "TSH" in the last 8760 hours.   BMP Recent Labs    12/02/21 1056 02/01/22 1239 03/23/22 1032  NA 136 140 139  K 4.7 4.7 4.7  CL 104 109 104  CO2 19* 24 22  GLUCOSE 92 90 94  BUN 21 24* 19  CREATININE 1.42* 0.99 0.98  CALCIUM 9.5 9.3 9.9  GFRNONAA  --  >60  --     HEMOGLOBIN A1C Lab Results   Component Value  Date   HGBA1C 6.3 (H) 03/23/2022   MPG 134.11 10/22/2020    IMPRESSION:    ICD-10-CM   1. Preop cardiovascular exam  Z01.810     2. Coronary atherosclerosis due to calcified coronary lesion  I25.10 EKG 12-Lead   I25.84     3. Atherosclerosis of native coronary artery of native heart without angina pectoris  I25.10     4. Atherosclerosis of aorta  I70.0     5. Benign hypertension  I10     6. Type 2 diabetes mellitus with stage 3a chronic kidney disease, without long-term current use of insulin  E11.22    N18.31     7. Type 2 diabetes mellitus with hyperlipidemia  E11.69    E78.5        RECOMMENDATIONS: Leslie Davis is a 67 y.o. African-American female whose past medical history and cardiac risk factors include: CAD, Coronary artery calcification, aortic atherosclerosis, hypertension, hyperlipidemia, GERD, fatty liver disease, IDDM Type II, nephrolithiasis, advanced age.  Preop cardiovascular exam Patient being considered for excision of mesenteric mass followed by possible small bowel resection. EKG today illustrates sinus bradycardia with T wave inversions also noted on prior studies No use of sublingual nitroglycerin tablets. Compliant with current medical therapy. Exercises upto 60 minutes/day at the gym. No longer on insulin  She is acceptable risk for her upcoming non-cardiac surgery.  I have asked her to hold aspirin for at least 7 days prior to surgery if recommended by her surgeon. Have asked her to hold Farxiga 3 days prior to surgery. She can resume aspirin and Farxiga when cleared by her provider from surgery.  Coronary atherosclerosis due to calcified coronary lesion Atherosclerosis of native coronary artery of native heart without angina pectoris Atherosclerosis of aorta Denies angina pectoris. No sublingual nitroglycerin tablets since last office visit. LAD disease has been treated with medical therapy since May 2023. Total CAC 341,  93rd percentile Continue aspirin and statin therapy. Reemphasized importance of secondary prevention.  Benign hypertension Office blood pressures are not at goal. Patient states that home blood pressures are better controlled. Medications reconciled Did not tolerate BiDil and feels fatigued with Toprol-XL 12.5 mg p.o. daily  Type 2 diabetes mellitus with stage 3a chronic kidney disease, with long-term current use of insulin and hyperlipidemia Hemoglobin A1c well-controlled. Patient states that she is no longer on insulin. Currently on carvedilol, ARB, Farxiga, Zetia, rosuvastatin Reemphasized importance of glycemic control.  FINAL MEDICATION LIST END OF ENCOUNTER: No orders of the defined types were placed in this encounter.   There are no discontinued medications.     Current Outpatient Medications:    albuterol (PROAIR HFA) 108 (90 Base) MCG/ACT inhaler, , Disp: 1 each, Rfl: 0   ALLERGY RELIEF CETIRIZINE 10 MG tablet, TAKE ONE TABLET BY MOUTH ONCE DAILY, Disp: 90 tablet, Rfl: 2   amLODipine (NORVASC) 10 MG tablet, TAKE ONE TABLET BY MOUTH ONCE DAILY, Disp: 90 tablet, Rfl: 3   aspirin EC 81 MG tablet, Take 1 tablet (81 mg total) by mouth daily. Swallow whole., Disp: 150 tablet, Rfl: 2   benzonatate (TESSALON PERLES) 100 MG capsule, Take 1 capsule (100 mg total) by mouth 3 (three) times daily as needed for cough. (Patient not taking: Reported on 07/05/2022), Disp: 30 capsule, Rfl: 1   Blood Glucose Monitoring Suppl (ONETOUCH VERIO) w/Device KIT, Use as directed to check blood sugars 2 times per day dx: e11.65, Disp: 1 kit, Rfl: 1   carvedilol (COREG) 3.125 MG tablet, TAKE  ONE TABLET BY MOUTH TWICE DAILY WITH A MEAL, Disp: 180 tablet, Rfl: 1   celecoxib (CELEBREX) 200 MG capsule, Take 1 capsule (200 mg total) by mouth 2 (two) times daily as needed., Disp: 180 capsule, Rfl: 2   cetirizine (ZYRTEC ALLERGY) 10 MG tablet, Take 1 tablet (10 mg total) by mouth daily., Disp: 30 tablet, Rfl:  2   EDARBI 80 MG TABS, TAKE ONE TABLET BY MOUTH ONCE DAILY, Disp: 90 tablet, Rfl: 2   ezetimibe (ZETIA) 10 MG tablet, Take 1 tablet (10 mg total) by mouth daily., Disp: 90 tablet, Rfl: 3   FARXIGA 10 MG TABS tablet, TAKE ONE TABLET BY MOUTH ONCE DAILY BEFORE BREAKFAST, Disp: 90 tablet, Rfl: 2   ferrous gluconate (FERGON) 324 MG tablet, TAKE 1 TABLET BY MOUTH TWICE DAILY WITH A MEAL. (Patient taking differently: Take 324 mg by mouth 2 (two) times daily with a meal.), Disp: 100 tablet, Rfl: 0   glucose blood (ONETOUCH VERIO) test strip, USE TO check blood glucose TWICE DAILY AS DIRECTED, Disp: 150 strip, Rfl: 11   guaiFENesin-codeine (ROBITUSSIN AC) 100-10 MG/5ML syrup, Take 5 mLs by mouth 3 (three) times daily as needed for cough. (Patient not taking: Reported on 07/05/2022), Disp: 120 mL, Rfl: 0   linaclotide ([name]) 145 MCG CAPS capsule, Take 1 capsule (145 mcg total) by mouth daily., Disp: 30 capsule, Rfl: 0   nitroGLYCERIN (NITROSTAT) 0.4 MG SL tablet, Place 1 tablet (0.4 mg total) under the tongue every 5 (five) minutes as needed for chest pain. If you require more than two tablets five minutes apart go to the nearest ER via EMS., Disp: 30 tablet, Rfl: 0   ondansetron (ZOFRAN) 4 MG tablet, Take 1 tablet (4 mg total) by mouth every 8 (eight) hours as needed for nausea or vomiting., Disp: 20 tablet, Rfl: 0   OneTouch Delica Lancets 33G MISC, Use as directed to check blood sugars 2 times per day dx:e11.65, Disp: 150 each, Rfl: 3   OZEMPIC, 2 MG/DOSE, 8 MG/3ML SOPN, Inject 2mg  into THE SKIN ONCE WEEKLY (Patient not taking: Reported on 07/05/2022), Disp: 9 mL, Rfl: 0   pantoprazole (PROTONIX) 40 MG tablet, Take 1 tablet (40 mg total) by mouth 2 (two) times daily., Disp: 60 tablet, Rfl: 3   rosuvastatin (CRESTOR) 20 MG tablet, TAKE ONE TABLET BY MOUTH ONCE DAILY, Disp: 90 tablet, Rfl: 2   sucralfate (CARAFATE) 1 g tablet, Take 1 tablet (1 g total) by mouth 4 (four) times daily -  with meals and at  bedtime. (Patient not taking: Reported on 07/05/2022), Disp: 60 tablet, Rfl: 4   temazepam (RESTORIL) 30 MG capsule, TAKE ONE CAPSULE BY MOUTH AT bedtime AS NEEDED, Disp: 30 capsule, Rfl: 3   traMADol (ULTRAM) 50 MG tablet, Take 1 tablet (50 mg total) by mouth every 12 (twelve) hours as needed., Disp: 30 tablet, Rfl: 0   TRUEPLUS PEN NEEDLES 31G X 6 MM MISC, USE WITH pen TO INJECT insulin DAILY, Disp: 150 each, Rfl: 3  Orders Placed This Encounter  Procedures   EKG 12-Lead   There are no Patient Instructions on file for this visit.   --Continue cardiac medications as reconciled in final medication list. --Return in about 6 months (around 01/04/2023) for Follow up, CAD,post-surgery. Or sooner if needed. --Continue follow-up with your primary care physician regarding the management of your other chronic comorbid conditions.  Patient's questions and concerns were addressed to her satisfaction. She voices understanding of the instructions provided during this encounter.  This note was created using a voice recognition software as a result there may be grammatical errors inadvertently enclosed that do not reflect the nature of this encounter. Every attempt is made to correct such errors.  Tessa Lerner, [place], [facility]  Pager:  [phone] Office: [phone]

## 2022-07-05 NOTE — Progress Notes (Signed)
I,[name],acting as a scribe for Gwynneth Aliment, MD.,have documented all relevant documentation on the behalf of Gwynneth Aliment, MD,as directed by  Gwynneth Aliment, MD while in the presence of Gwynneth Aliment, MD.    Subjective:     Patient ID: Leslie Davis , female    DOB: May 19, 1955 , 67 y.o.   MRN: [id number]   Chief Complaint  Patient presents with   Diabetes   Hypertension    HPI  Patient is here today for a diabetes/HTN follow up. She denies having any headaches, chest pain and shortness of breath. Patient reports compliance with her meds.   She does admit to having a fall this past Saturday. She states she slipped on some water on her back porch. She slid and fell forward onto her knees. She did experience worsening left knee pain.  She will visit orthopedic tomorrow for further evaluation of her knee pain.   Diabetes She presents for her follow-up diabetic visit. She has type 2 diabetes mellitus. Her disease course has been stable. Hypoglycemia symptoms include dizziness. Pertinent negatives for diabetes include no blurred vision, no polydipsia, no polyphagia and no polyuria. There are no hypoglycemic complications. Diabetic complications include nephropathy. Risk factors for coronary artery disease include diabetes mellitus, dyslipidemia, hypertension and post-menopausal. She participates in exercise intermittently. An ACE inhibitor/angiotensin II receptor blocker is being taken. Eye exam is current.  Hypertension This is a chronic problem. The current episode started more than 1 year ago. The problem has been gradually improving since onset. Pertinent negatives include no blurred vision. Risk factors for coronary artery disease include dyslipidemia and diabetes mellitus. The current treatment provides moderate improvement.     Past Medical History:  Diagnosis Date   CKD (chronic kidney disease)    stage 3   Coronary artery disease 07/2021   obstructive  CAD-70% LAD-treated medically   DM (diabetes mellitus)    Fatty liver disease, nonalcoholic    Gastritis    Gastroparesis    GERD (gastroesophageal reflux disease)    Heartburn    Hiatal hernia    HLD (hyperlipidemia)    HTN (hypertension)    Joint pain    Renal calculus    Rheumatoid arthritis    Vitamin D deficiency      Family History  Problem Relation Age of Onset   Diabetes Mother    Thyroid disease Mother    Kidney disease Mother    Hypertension Father    Hyperlipidemia Father    Sudden death Father    Stroke Father    Diabetes Sister    Heart disease Sister    Kidney disease Sister    COPD Sister    Heart disease Brother 34   Diabetes Brother    Stomach cancer Maternal Aunt        X 2 aunts   Diabetes Maternal Grandmother    Colon cancer Neg Hx    Esophageal cancer Neg Hx    Rectal cancer Neg Hx    Colon polyps Neg Hx      Current Outpatient Medications:    albuterol (PROAIR HFA) 108 (90 Base) MCG/ACT inhaler, , Disp: 1 each, Rfl: 0   ALLERGY RELIEF CETIRIZINE 10 MG tablet, TAKE ONE TABLET BY MOUTH ONCE DAILY, Disp: 90 tablet, Rfl: 2   amLODipine (NORVASC) 10 MG tablet, TAKE ONE TABLET BY MOUTH ONCE DAILY, Disp: 90 tablet, Rfl: 3   aspirin EC 81 MG tablet, Take 1 tablet (81 mg  total) by mouth daily. Swallow whole., Disp: 150 tablet, Rfl: 2   Blood Glucose Monitoring Suppl (ONETOUCH VERIO) w/Device KIT, Use as directed to check blood sugars 2 times per day dx: e11.65, Disp: 1 kit, Rfl: 1   carvedilol (COREG) 3.125 MG tablet, TAKE ONE TABLET BY MOUTH TWICE DAILY WITH A MEAL, Disp: 180 tablet, Rfl: 1   celecoxib (CELEBREX) 200 MG capsule, Take 1 capsule (200 mg total) by mouth 2 (two) times daily as needed., Disp: 180 capsule, Rfl: 2   cetirizine (ZYRTEC ALLERGY) 10 MG tablet, Take 1 tablet (10 mg total) by mouth daily., Disp: 30 tablet, Rfl: 2   EDARBI 80 MG TABS, TAKE ONE TABLET BY MOUTH ONCE DAILY, Disp: 90 tablet, Rfl: 2   ezetimibe (ZETIA) 10 MG tablet,  Take 1 tablet (10 mg total) by mouth daily., Disp: 90 tablet, Rfl: 3   FARXIGA 10 MG TABS tablet, TAKE ONE TABLET BY MOUTH ONCE DAILY BEFORE BREAKFAST, Disp: 90 tablet, Rfl: 2   ferrous gluconate (FERGON) 324 MG tablet, TAKE 1 TABLET BY MOUTH TWICE DAILY WITH A MEAL. (Patient taking differently: Take 324 mg by mouth 2 (two) times daily with a meal.), Disp: 100 tablet, Rfl: 0   glucose blood (ONETOUCH VERIO) test strip, USE TO check blood glucose TWICE DAILY AS DIRECTED, Disp: 150 strip, Rfl: 11   linaclotide ([name]) 145 MCG CAPS capsule, Take 1 capsule (145 mcg total) by mouth daily., Disp: 30 capsule, Rfl: 0   ondansetron (ZOFRAN) 4 MG tablet, Take 1 tablet (4 mg total) by mouth every 8 (eight) hours as needed for nausea or vomiting., Disp: 20 tablet, Rfl: 0   OneTouch Delica Lancets 33G MISC, Use as directed to check blood sugars 2 times per day dx:e11.65, Disp: 150 each, Rfl: 3   pantoprazole (PROTONIX) 40 MG tablet, Take 1 tablet (40 mg total) by mouth 2 (two) times daily., Disp: 60 tablet, Rfl: 3   rosuvastatin (CRESTOR) 20 MG tablet, TAKE ONE TABLET BY MOUTH ONCE DAILY, Disp: 90 tablet, Rfl: 2   temazepam (RESTORIL) 30 MG capsule, TAKE ONE CAPSULE BY MOUTH AT bedtime AS NEEDED, Disp: 30 capsule, Rfl: 3   traMADol (ULTRAM) 50 MG tablet, Take 1 tablet (50 mg total) by mouth every 12 (twelve) hours as needed., Disp: 30 tablet, Rfl: 0   TRUEPLUS PEN NEEDLES 31G X 6 MM MISC, USE WITH pen TO INJECT insulin DAILY, Disp: 150 each, Rfl: 3   benzonatate (TESSALON PERLES) 100 MG capsule, Take 1 capsule (100 mg total) by mouth 3 (three) times daily as needed for cough. (Patient not taking: Reported on 07/05/2022), Disp: 30 capsule, Rfl: 1   guaiFENesin-codeine (ROBITUSSIN AC) 100-10 MG/5ML syrup, Take 5 mLs by mouth 3 (three) times daily as needed for cough. (Patient not taking: Reported on 07/05/2022), Disp: 120 mL, Rfl: 0   nitroGLYCERIN (NITROSTAT) 0.4 MG SL tablet, Place 1 tablet (0.4 mg total) under the  tongue every 5 (five) minutes as needed for chest pain. If you require more than two tablets five minutes apart go to the nearest ER via EMS., Disp: 30 tablet, Rfl: 0   OZEMPIC, 2 MG/DOSE, 8 MG/3ML SOPN, Inject 2mg  into THE SKIN ONCE WEEKLY (Patient not taking: Reported on 07/05/2022), Disp: 9 mL, Rfl: 0   sucralfate (CARAFATE) 1 g tablet, Take 1 tablet (1 g total) by mouth 4 (four) times daily -  with meals and at bedtime. (Patient not taking: Reported on 07/05/2022), Disp: 60 tablet, Rfl: 4   Allergies  Allergen Reactions   Fiasp [Insulin Aspart (W-Niacinamide)] Hives     Review of Systems  Constitutional: Negative.   Eyes:  Negative for blurred vision.  Respiratory: Negative.    Cardiovascular: Negative.   Gastrointestinal: Negative.   Endocrine: Negative for polydipsia, polyphagia and polyuria.  Skin: Negative.   Neurological:  Positive for dizziness.  Psychiatric/Behavioral: Negative.       Today's Vitals   07/05/22 0856 07/05/22 0936  BP: (!) 142/78 138/80  Pulse: (!) 52   Temp: (!) 97.5 F (36.4 C)   SpO2: 98%   Weight: 181 lb 3.2 oz (82.2 kg)   Height:  (1.575 m)    Body mass index is 33.14 kg/m.  Wt Readings from Last 3 Encounters:  07/05/22 182 lb 12.8 oz (82.9 kg)  07/05/22 181 lb 3.2 oz (82.2 kg)  04/20/22 175 lb (79.4 kg)    Objective:  Physical Exam Vitals and nursing note reviewed.  Constitutional:      Appearance: Normal appearance. She is obese.  HENT:     Head: Normocephalic and atraumatic.  Eyes:     Extraocular Movements: Extraocular movements intact.  Cardiovascular:     Rate and Rhythm: Normal rate and regular rhythm.     Heart sounds: Normal heart sounds.  Pulmonary:     Effort: Pulmonary effort is normal.     Breath sounds: Normal breath sounds.  Musculoskeletal:     Cervical back: Normal range of motion.  Skin:    General: Skin is warm.  Neurological:     General: No focal deficit present.     Mental Status: She is alert.   Psychiatric:        Mood and Affect: Mood normal.        Behavior: Behavior normal.         Assessment And Plan:     1. Type 2 diabetes mellitus with other circulatory complication, with long-term current use of insulin Comments: Chronic, I will check labs as below. She wants to resume Ozempic, agrees to wait until labs are available. She will f/u in 3 months. - CMP14+EGFR - Hemoglobin A1c - Lipid panel  2. Hypertensive heart and renal disease with renal failure, stage 1 through stage 4 or unspecified chronic kidney disease, without heart failure Comments: Chronic, fair control. She will c/w amlodipine , carvedilol 3.125mg  bid,  Edarbi  - CMP14+EGFR - Lipid panel  3. Osteopenia of neck of left femur Comments: Bone density results reviewed. She agrees to [employer] referral. Discussed calcium/vitamin D supplementation as well. - Vitamin D (25 hydroxy)  4. Mesenteric mass Comments: [facility] notes reviewed in full detail. Currently awaiting cardiac clearance so DOS can be scheduled. CT scan resutls reviewed in detail.  5. Chronic pain of left knee Comments: She has Ortho appt in am, unfortunately, she has acute on chronic pain due to fall.  6. Fall, initial encounter Comments: Occurred on 07/03/22, she now has knee pain. Encouraged to avoid wearing Crocs on wet surfaces.  7. Class 1 obesity due to excess calories with serious comorbidity and body mass index (BMI) of 33.0 to 33.9 in adult Comments: She is encouraged to strive for BMI less than 30 to decrease cardiac risk. Advised to aim for at least 150 minutes of exercise per week.  8. Herpes zoster vaccination declined  9. Chronic renal disease, stage II Comments: January GFR 63, urine microalbumin 234 Sept 2023. Reminded to avoid NSAIDs.   Patient was given opportunity to ask questions. Patient verbalized  understanding of the plan and was able to repeat key elements of the plan. All questions were answered to their  satisfaction.   I, Gwynneth Aliment, MD, have reviewed all documentation for this visit. The documentation on 07/05/22 for the exam, diagnosis, procedures, and orders are all accurate and complete.   IF YOU HAVE BEEN REFERRED TO A SPECIALIST, IT MAY TAKE 1-2 WEEKS TO SCHEDULE/PROCESS THE REFERRAL. IF YOU HAVE NOT HEARD FROM US/SPECIALIST IN TWO WEEKS, PLEASE GIVE [place] A CALL AT [phone] X [phone].   THE PATIENT IS ENCOURAGED TO PRACTICE SOCIAL DISTANCING DUE TO THE COVID-19 PANDEMIC.

## 2022-07-06 ENCOUNTER — Ambulatory Visit (INDEPENDENT_AMBULATORY_CARE_PROVIDER_SITE_OTHER): Payer: 59 | Admitting: Orthopaedic Surgery

## 2022-07-06 DIAGNOSIS — Z96652 Presence of left artificial knee joint: Secondary | ICD-10-CM | POA: Diagnosis not present

## 2022-07-06 LAB — CMP14+EGFR
ALT: 21 IU/L (ref 0–32)
AST: 20 IU/L (ref 0–40)
Albumin/Globulin Ratio: 1.5 (ref 1.2–2.2)
Albumin: 4.2 g/dL (ref 3.9–4.9)
Alkaline Phosphatase: 116 IU/L (ref 44–121)
BUN/Creatinine Ratio: 18 (ref 12–28)
BUN: 20 mg/dL (ref 8–27)
Bilirubin Total: 0.3 mg/dL (ref 0.0–1.2)
CO2: 23 mmol/L (ref 20–29)
Calcium: 9.6 mg/dL (ref 8.7–10.3)
Chloride: 104 mmol/L (ref 96–106)
Creatinine, Ser: 1.12 mg/dL — ABNORMAL HIGH (ref 0.57–1.00)
Globulin, Total: 2.8 g/dL (ref 1.5–4.5)
Glucose: 106 mg/dL — ABNORMAL HIGH (ref 70–99)
Potassium: 5.1 mmol/L (ref 3.5–5.2)
Sodium: 140 mmol/L (ref 134–144)
Total Protein: 7 g/dL (ref 6.0–8.5)
eGFR: 54 mL/min/{1.73_m2} — ABNORMAL LOW (ref >59)

## 2022-07-06 LAB — LIPID PANEL
Chol/HDL Ratio: 1.9 ratio (ref 0.0–4.4)
Cholesterol, Total: 206 mg/dL — ABNORMAL HIGH (ref 100–199)
HDL: 109 mg/dL (ref >39)
LDL Chol Calc (NIH): 87 mg/dL (ref 0–99)
Triglycerides: 52 mg/dL (ref 0–149)
VLDL Cholesterol Cal: 10 mg/dL (ref 5–40)

## 2022-07-06 LAB — HEMOGLOBIN A1C
Est. average glucose Bld gHb Est-mCnc: 148 mg/dL
Hgb A1c MFr Bld: 6.8 % — ABNORMAL HIGH (ref 4.8–5.6)

## 2022-07-06 LAB — VITAMIN D 25 HYDROXY (VIT D DEFICIENCY, FRACTURES): Vit D, 25-Hydroxy: 32.5 ng/mL (ref 30.0–100.0)

## 2022-07-06 NOTE — Progress Notes (Signed)
Office Visit Note   Patient: Leslie Davis           Date of Birth: [birth year]           MRN: [id number] Visit Date: 07/06/2022              Requested by: Dorothyann Peng, MD [address] [place],  [place] [address] PCP: Dorothyann Peng, MD   Assessment & Plan: Visit Diagnoses:  1. Status post total left knee replacement     Plan: The bone scan is negative for loosening of the implants.  This is consistent with bony remodeling status post knee replacement.  I think her main issue is lack of strength in the quadriceps.  I have urged her to continue to work on this.  Will see her back as needed.  Follow-Up Instructions: No follow-ups on file.   Orders:  No orders of the defined types were placed in this encounter.  No orders of the defined types were placed in this encounter.     Procedures: No procedures performed   Clinical Data: No additional findings.   Subjective: Chief Complaint  Patient presents with   Left Knee - Follow-up    HPI  Patient returns today to discuss bone scan of the left knee.  Review of Systems   Objective: Vital Signs: There were no vitals taken for this visit.  Physical Exam  Ortho Exam  Exam is unchanged.  Specialty Comments:  No specialty comments available.  Imaging: No results found.   PMFS History: Patient Active Problem List   Diagnosis Date Noted   Acute cough 05/02/2022   Nasal congestion 05/02/2022   Type 2 diabetes mellitus with obesity 03/24/2022   Class 1 obesity with serious comorbidity and body mass index (BMI) of 33.0 to 33.9 in adult 03/24/2022   Atherosclerosis of aorta 03/23/2022   Coronary artery disease of native artery of native heart with stable angina pectoris    Other constipation 05/25/2021   Status post total left knee replacement 10/27/2020   Primary osteoarthritis of left knee    NAFLD (nonalcoholic fatty liver disease) 33/29/5188   Other fatigue 07/23/2020   SOBOE  (shortness of breath on exertion) 07/23/2020   Diabetes mellitus 07/23/2020   Hypertension associated with type 2 diabetes mellitus 07/23/2020   Hyperlipidemia associated with type 2 diabetes mellitus 07/23/2020   Absolute anemia 07/23/2020   Vitamin D deficiency 07/23/2020   At risk for heart disease 07/23/2020   AKI (acute kidney injury) 09/07/2019   Acute kidney injury 09/06/2019   Bradycardia 09/06/2019   Hyperglycemia 09/06/2019   HLD (hyperlipidemia)    Hypertensive nephropathy 05/16/2019   Erosive gastritis 11/16/2017   Fatty liver 11/16/2017   Abdominal pain 12/11/2012   Early satiety 12/11/2012   Hiatal hernia    Hx of cholecystectomy 02/05/2011   Flu-like symptoms 02/05/2011   CONSTIPATION 08/27/2008   FATTY LIVER DISEASE 05/07/2008   Nausea with vomiting 04/23/2008   EPIGASTRIC PAIN 04/23/2008   RENAL CALCULUS 04/22/2008   Type 2 diabetes mellitus with stage 2 chronic kidney disease, with long-term current use of insulin 05/19/2006   Type 2 diabetes mellitus with hyperlipidemia 05/19/2006   HYPERTENSION, BENIGN SYSTEMIC 05/19/2006   GASTROESOPHAGEAL REFLUX, NO ESOPHAGITIS 05/19/2006   AMENORRHEA 05/19/2006   Past Medical History:  Diagnosis Date   CKD (chronic kidney disease)    stage 3   Coronary artery disease 07/2021   obstructive CAD-70% LAD-treated medically   DM (diabetes mellitus)  Fatty liver disease, nonalcoholic    Gastritis    Gastroparesis    GERD (gastroesophageal reflux disease)    Heartburn    Hiatal hernia    HLD (hyperlipidemia)    HTN (hypertension)    Joint pain    Renal calculus    Rheumatoid arthritis    Vitamin D deficiency     Family History  Problem Relation Age of Onset   Diabetes Mother    Thyroid disease Mother    Kidney disease Mother    Hypertension Father    Hyperlipidemia Father    Sudden death Father    Stroke Father    Diabetes Sister    Heart disease Sister    Kidney disease Sister    COPD Sister    Heart  disease Brother 72   Diabetes Brother    Stomach cancer Maternal Aunt        X 2 aunts   Diabetes Maternal Grandmother    Colon cancer Neg Hx    Esophageal cancer Neg Hx    Rectal cancer Neg Hx    Colon polyps Neg Hx     Past Surgical History:  Procedure Laterality Date   BREAST BIOPSY  02/04/2022   MM LT RADIOACTIVE SEED EA ADD LESION LOC MAMMO GUIDE 02/04/2022 [facility]   BREAST BIOPSY  02/04/2022   MM LT RADIOACTIVE SEED LOC MAMMO GUIDE 02/04/2022 [facility]   BREAST LUMPECTOMY WITH RADIOACTIVE SEED LOCALIZATION Left 02/05/2022   Procedure: LEFT BREAST BRACKETED LUMPECTOMY WITH RADIOACTIVE SEED LOCALIZATION;  Surgeon: Griselda Miner, MD;  Location: [facility];  Service: General;  Laterality: Left;   CHOLECYSTECTOMY     COLONOSCOPY  09/10/2011   normal   JOINT REPLACEMENT  2022   left TKA   LEFT HEART CATH AND CORONARY ANGIOGRAPHY N/A 08/18/2021   Procedure: LEFT HEART CATH AND CORONARY ANGIOGRAPHY;  Surgeon: Elder Negus, MD;  Location: [facility];  Service: Cardiovascular;  Laterality: N/A;   TOTAL KNEE ARTHROPLASTY Left 10/27/2020   Procedure: LEFT TOTAL KNEE ARTHROPLASTY;  Surgeon: Tarry Kos, MD;  Location: [facility];  Service: Orthopedics;  Laterality: Left;   TUBAL LIGATION     UPPER GASTROINTESTINAL ENDOSCOPY     Social History   Occupational History   Occupation: Housekeeping    Comment: Education officer, environmental  Tobacco Use   Smoking status: Former    Packs/day: 0.25    Years: 1.00    Additional pack years: 0.00    Total pack years: 0.25    Types: Cigarettes    Quit date: 09/10/1974    Years since quitting: 47.8   Smokeless tobacco: Never   Tobacco comments:    she no longer smokes.   Vaping Use   Vaping Use: Never used  Substance and Sexual Activity   Alcohol use: Never   Drug use: Never   Sexual activity: Not Currently    Birth control/protection: Post-menopausal

## 2022-07-16 ENCOUNTER — Telehealth: Payer: Self-pay

## 2022-07-16 NOTE — Progress Notes (Signed)
error  Huey Romans [facility] Clinical Pharmacist Assistant [phone]

## 2022-07-21 ENCOUNTER — Telehealth (INDEPENDENT_AMBULATORY_CARE_PROVIDER_SITE_OTHER): Payer: 59 | Admitting: Family Medicine

## 2022-07-21 ENCOUNTER — Encounter (INDEPENDENT_AMBULATORY_CARE_PROVIDER_SITE_OTHER): Payer: Self-pay | Admitting: Family Medicine

## 2022-07-21 DIAGNOSIS — E669 Obesity, unspecified: Secondary | ICD-10-CM | POA: Diagnosis not present

## 2022-07-21 DIAGNOSIS — Z6829 Body mass index (BMI) 29.0-29.9, adult: Secondary | ICD-10-CM

## 2022-07-21 DIAGNOSIS — N1831 Chronic kidney disease, stage 3a: Secondary | ICD-10-CM | POA: Diagnosis not present

## 2022-07-21 DIAGNOSIS — E1122 Type 2 diabetes mellitus with diabetic chronic kidney disease: Secondary | ICD-10-CM | POA: Diagnosis not present

## 2022-07-21 DIAGNOSIS — Z7984 Long term (current) use of oral hypoglycemic drugs: Secondary | ICD-10-CM

## 2022-07-21 NOTE — Progress Notes (Signed)
TeleHealth Visit:  This visit was completed with telemedicine (audio/video) technology. Tabitha has verbally consented to this TeleHealth visit. The patient is located at home, the provider is located at home. The participants in this visit include the listed provider and patient. The visit was conducted today via phone call.  Unsuccessful attempt was made to connect to video. Length of call was 14 minutes.  OBESITY Faustina is here to discuss her progress with her obesity treatment plan along with follow-up of her obesity related diagnoses.   Today's visit was # 23 Starting weight: 208 lbs Starting date: 07/23/2020 Weight at last in office visit: 166 lbs on 03/24/2022 Total weight loss: 42 lbs at last in office visit on 03/24/2022. Today's reported weight (07/21/22):  181 lbs  Nutrition Plan: the Category 1 plan and with breakfast and lunch options  Current exercise: stationary bike   Interim History:  Last office visit was Mar 24, 2022 with Dr. Sharee Holster. Weight was 166 lbs. Most recent weight at her PCP was 181 pounds. She has not had her Ozempic for 3 months. She has noted increased hunger since stopping Ozempic. However she reports lack of appetite at times. She has nausea daily and has saltines and ginger ale.  She feels she has gained weight due to drinking sodas, eating sweets and larger portions. Doesn't have sweets daily.   She is having laparoscopic assisted possible open excision of mesenteric mass on May 16. She would like to call back to schedule a follow-up appointment after she recovers from surgery.  Breakfast: [place] sausage and 2 boiled eggs Lunch: Varies but cooks a meat and a vegetable, sometimes a carbs such as rice or potatoes, eats plenty of vegetables, Dinner: often skips   She says her goal weight is 170 pounds.  She had hit this goal at her last office visit.  Assessment/Plan:  1. Type 2 Diabetes Mellitus with CKD stage IIIa, without long-term current  use of insulin HgbA1c is at goal. Last A1c was 6.8 on 07/05/2022 despite being off of Ozempic for the last 3 months. Medication(s): Farxiga 10 mg daily.  Lab Results  Component Value Date   HGBA1C 6.8 (H) 07/05/2022   HGBA1C 6.3 (H) 03/23/2022   HGBA1C 6.1 (H) 12/02/2021   Lab Results  Component Value Date   MICROALBUR 150 11/15/2019   LDLCALC 87 07/05/2022   CREATININE 1.12 (H) 07/05/2022   Lab Results  Component Value Date   GFR 47.43 (L) 12/23/2020   GFR 77.09 11/17/2017   GFR 93.79 10/17/2014    Plan: Continue Farxiga 10 mg daily. Remain off Ozempic until after approved by surgeon postop.   2. Generalized Obesity: Current BMI 29  Greta is currently in the action stage of change. As such, her goal is to continue with weight loss efforts.  She has agreed to the Category 1 plan.  1.  Adhere to category 1 plan tightly until surgery. 2.  Cut out sugary beverages.  Try ginger ale 0. 3.  Increase protein intake.  Exercise goals: Exercise bike 3 times a week for 30 minutes.  Behavioral modification strategies: increasing lean protein intake, decreasing simple carbohydrates , no meal skipping, meal planning , decrease liquid calories, and planning for success.  Lamoyne has agreed to follow-up with our clinic after she recovers from surgery.  No orders of the defined types were placed in this encounter.   There are no discontinued medications.   No orders of the defined types were placed in this encounter.  Objective:   VITALS: Per patient if applicable, see vitals. GENERAL: Alert and in no acute distress. CARDIOPULMONARY: No increased WOB. Speaking in clear sentences.  PSYCH: Pleasant and cooperative. Speech normal rate and rhythm. Affect is appropriate. Insight and judgement are appropriate. Attention is focused, linear, and appropriate.  NEURO: Oriented as arrived to appointment on time with no prompting.   Attestation Statements:   Reviewed by clinician  on day of visit: allergies, medications, problem list, medical history, surgical history, family history, social history, and previous encounter notes.  This was prepared with the assistance of Engineer, civil (consulting).  Occasional wrong-word or sound-a-like substitutions may have occurred due to the inherent limitations of voice recognition software.

## 2022-07-27 NOTE — Pre-Procedure Instructions (Addendum)
Surgical Instructions    Your procedure is scheduled on Thursday,  Aug 05, 2022  Report to [facility] Main Entrance "A" at 0900 A.M., then check in with the Admitting office.  Call this number if you have problems the morning of surgery:  [phone]  If you have any questions prior to your surgery date call [phone]: Open Monday-Friday 8am-4pm If you experience any cold or flu symptoms such as cough, fever, chills, shortness of breath, etc. between now and your scheduled surgery, please notify us at the above number.     Remember:  Do not eat after midnight the night before your surgery  You may drink clear liquids until 8AM the morning of your surgery.   Clear liquids allowed are: Water, Non-Citrus Juices (without pulp), Carbonated Beverages, Clear Tea, Black Coffee Only (NO MILK, CREAM OR POWDERED CREAMER of any kind), and Gatorade.  Patient Instructions  The night before surgery:  No food after midnight. ONLY clear liquids after midnight             Drink TWO (2) 12 oz G2 given to you in your pre admission testing appointment.   The day of surgery (if you have diabetes): Drink ONE (1) 12 oz G2 given to you in your pre admission testing appointment by 8 AM the morning of surgery. Drink in one sitting. Do not sip.  This drink was given to you during your hospital  pre-op appointment visit.  Nothing else to drink after completing the  12 oz bottle of G2.         If you have questions, please contact your surgeon's office.     Take these medicines the morning of surgery with A SIP OF WATER : albuterol (PROAIR HFA) inhaler as needed  cetirizine (ZYRTEC ALLERGY)  amLODipine (NORVASC)  carvedilol (COREG)  ezetimibe (ZETIA)  ondansetron (ZOFRAN)  pantoprazole (PROTONIX)  rosuvastatin (CRESTOR)  traMADol (ULTRAM) as needed nitroGLYCERIN (NITROSTAT) as needed   Follow your surgeon's instructions on when to stop Aspirin.  If no instructions were given by your surgeon  then you will need to call the office to get those instructions.    As of today, STOP taking any Aleve, Naproxen, Ibuprofen, Motrin, Advil, Goody's, BC's, all herbal medications, fish oil, and all vitamins. This include celecoxib (CELEBREX).   WHAT DO I DO ABOUT MY DIABETES MEDICATION?   Hold FARXIGA 72 hours prior to surgery. Your last dose will be on May 12.   Hold Ozempic (if you still take it) for 1 week prior to your surgery. Last dose should be May 5.    HOW TO MANAGE YOUR DIABETES BEFORE AND AFTER SURGERY  Why is it important to control my blood sugar before and after surgery? Improving blood sugar levels before and after surgery helps healing and can limit problems. A way of improving blood sugar control is eating a healthy diet by:  Eating less sugar and carbohydrates  Increasing activity/exercise  Talking with your doctor about reaching your blood sugar goals High blood sugars (greater than 180 mg/dL) can raise your risk of infections and slow your recovery, so you will need to focus on controlling your diabetes during the weeks before surgery. Make sure that the doctor who takes care of your diabetes knows about your planned surgery including the date and location.  How do I manage my blood sugar before surgery? Check your blood sugar at least 4 times a day, starting 2 days before surgery, to make sure that the  level is not too high or low.  Check your blood sugar the morning of your surgery when you wake up and every 2 hours until you get to the Short Stay unit.  If your blood sugar is less than 70 mg/dL, you will need to treat for low blood sugar: Do not take insulin. Treat a low blood sugar (less than 70 mg/dL) with  cup of clear juice (cranberry or apple), 4 glucose tablets, OR glucose gel. Recheck blood sugar in 15 minutes after treatment (to make sure it is greater than 70 mg/dL). If your blood sugar is not greater than 70 mg/dL on recheck, call [phone] for  further instructions. Report your blood sugar to the short stay nurse when you get to Short Stay.  If you are admitted to the hospital after surgery: Your blood sugar will be checked by the staff and you will probably be given insulin after surgery (instead of oral diabetes medicines) to make sure you have good blood sugar levels. The goal for blood sugar control after surgery is 80-180 mg/dL.                      Do NOT Smoke (Tobacco/Vaping) for 24 hours prior to your procedure.  If you use a CPAP at night, you may bring your mask/headgear for your overnight stay.   Contacts, glasses, piercing's, hearing aid's, dentures or partials may not be worn into surgery, please bring cases for these belongings.    For patients admitted to the hospital, discharge time will be determined by your treatment team.   Patients discharged the day of surgery will not be allowed to drive home, and someone needs to stay with them for 24 hours.  SURGICAL WAITING ROOM VISITATION Patients having surgery or a procedure may have no more than 2 support people in the waiting area - these visitors may rotate.   Children under the age of 59 must have an adult with them who is not the patient. If the patient needs to stay at the hospital during part of their recovery, the visitor guidelines for inpatient rooms apply. Pre-op nurse will coordinate an appropriate time for 1 support person to accompany patient in pre-op.  This support person may not rotate.   Please refer to the [facility] website for the visitor guidelines for Inpatients (after your surgery is over and you are in a regular room).    Special instructions:   [place]- Preparing For Surgery  Before surgery, you can play an important role. Because skin is not sterile, your skin needs to be as free of germs as possible. You can reduce the number of germs on your skin by washing with CHG (chlorahexidine gluconate) Soap before surgery.  CHG is an  antiseptic cleaner which kills germs and bonds with the skin to continue killing germs even after washing.    Oral Hygiene is also important to reduce your risk of infection.  Remember - BRUSH YOUR TEETH THE MORNING OF SURGERY WITH YOUR REGULAR TOOTHPASTE  Please do not use if you have an allergy to CHG or antibacterial soaps. If your skin becomes reddened/irritated stop using the CHG.  Do not shave (including legs and underarms) for at least 48 hours prior to first CHG shower. It is OK to shave your face.  Please follow these instructions carefully.   Shower the NIGHT BEFORE SURGERY and the MORNING OF SURGERY  If you chose to wash your hair, wash your hair first as  usual with your normal shampoo.  After you shampoo, rinse your hair and body thoroughly to remove the shampoo.  Use CHG Soap as you would any other liquid soap. You can apply CHG directly to the skin and wash gently with a scrungie or a clean washcloth.   Apply the CHG Soap to your body ONLY FROM THE NECK DOWN.  Do not use on open wounds or open sores. Avoid contact with your eyes, ears, mouth and genitals (private parts). Wash Face and genitals (private parts)  with your normal soap.   Wash thoroughly, paying special attention to the area where your surgery will be performed.  Thoroughly rinse your body with warm water from the neck down.  DO NOT shower/wash with your normal soap after using and rinsing off the CHG Soap.  Pat yourself dry with a CLEAN TOWEL.  Wear CLEAN PAJAMAS to bed the night before surgery  Place CLEAN SHEETS on your bed the night before your surgery  DO NOT SLEEP WITH PETS.   Day of Surgery: Take a shower with CHG soap. Do not wear jewelry or makeup Do not wear lotions, powders, perfumes/colognes, or deodorant. Do not shave 48 hours prior to surgery.  Men may shave face and neck. Do not bring valuables to the hospital.  [facility] is not responsible for any belongings or valuables. Do not  wear nail polish, gel polish, artificial nails, or any other type of covering on natural nails (fingers and toes) If you have artificial nails or gel coating that need to be removed by a nail salon, please have this removed prior to surgery. Artificial nails or gel coating may interfere with anesthesia's ability to adequately monitor your vital signs. Wear Clean/Comfortable clothing the morning of surgery Remember to brush your teeth WITH YOUR REGULAR TOOTHPASTE.   Please read over the following fact sheets that you were given.    If you received a COVID test during your pre-op visit  it is requested that you wear a mask when out in public, stay away from anyone that may not be feeling well and notify your surgeon if you develop symptoms. If you have been in contact with anyone that has tested positive in the last 10 days please notify you surgeon.

## 2022-07-28 ENCOUNTER — Other Ambulatory Visit: Payer: Self-pay

## 2022-07-28 ENCOUNTER — Encounter (HOSPITAL_COMMUNITY): Payer: Self-pay

## 2022-07-28 ENCOUNTER — Encounter (HOSPITAL_COMMUNITY)
Admission: RE | Admit: 2022-07-28 | Discharge: 2022-07-28 | Disposition: A | Discharge status: Home / Self Care | Payer: 59 | Source: Ambulatory Visit | Attending: Surgery | Admitting: Surgery

## 2022-07-28 VITALS — BP 162/60 | HR 52 | Temp 98.1°F | Resp 18 | Ht 64.0 in | Wt 186.1 lb

## 2022-07-28 DIAGNOSIS — M069 Rheumatoid arthritis, unspecified: Secondary | ICD-10-CM | POA: Diagnosis not present

## 2022-07-28 DIAGNOSIS — K449 Diaphragmatic hernia without obstruction or gangrene: Secondary | ICD-10-CM | POA: Insufficient documentation

## 2022-07-28 DIAGNOSIS — I2510 Atherosclerotic heart disease of native coronary artery without angina pectoris: Secondary | ICD-10-CM | POA: Diagnosis not present

## 2022-07-28 DIAGNOSIS — E1122 Type 2 diabetes mellitus with diabetic chronic kidney disease: Secondary | ICD-10-CM | POA: Diagnosis not present

## 2022-07-28 DIAGNOSIS — Z87891 Personal history of nicotine dependence: Secondary | ICD-10-CM | POA: Diagnosis not present

## 2022-07-28 DIAGNOSIS — E785 Hyperlipidemia, unspecified: Secondary | ICD-10-CM | POA: Diagnosis not present

## 2022-07-28 DIAGNOSIS — N183 Chronic kidney disease, stage 3 unspecified: Secondary | ICD-10-CM | POA: Diagnosis not present

## 2022-07-28 DIAGNOSIS — E669 Obesity, unspecified: Secondary | ICD-10-CM | POA: Diagnosis not present

## 2022-07-28 DIAGNOSIS — K219 Gastro-esophageal reflux disease without esophagitis: Secondary | ICD-10-CM | POA: Insufficient documentation

## 2022-07-28 DIAGNOSIS — I129 Hypertensive chronic kidney disease with stage 1 through stage 4 chronic kidney disease, or unspecified chronic kidney disease: Secondary | ICD-10-CM | POA: Diagnosis not present

## 2022-07-28 DIAGNOSIS — E1169 Type 2 diabetes mellitus with other specified complication: Secondary | ICD-10-CM | POA: Diagnosis not present

## 2022-07-28 DIAGNOSIS — Z01812 Encounter for preprocedural laboratory examination: Secondary | ICD-10-CM | POA: Diagnosis not present

## 2022-07-28 DIAGNOSIS — Z01818 Encounter for other preprocedural examination: Secondary | ICD-10-CM

## 2022-07-28 LAB — COMPREHENSIVE METABOLIC PANEL
ALT: 39 U/L (ref 0–44)
AST: 27 U/L (ref 15–41)
Albumin: 3.5 g/dL (ref 3.5–5.0)
Alkaline Phosphatase: 114 U/L (ref 38–126)
Anion gap: 8 (ref 5–15)
BUN: 21 mg/dL (ref 8–23)
CO2: 25 mmol/L (ref 22–32)
Calcium: 9.4 mg/dL (ref 8.9–10.3)
Chloride: 104 mmol/L (ref 98–111)
Creatinine, Ser: 1.25 mg/dL — ABNORMAL HIGH (ref 0.44–1.00)
GFR, Estimated: 47 mL/min — ABNORMAL LOW (ref >60)
Glucose, Bld: 208 mg/dL — ABNORMAL HIGH (ref 70–99)
Potassium: 4.6 mmol/L (ref 3.5–5.1)
Sodium: 137 mmol/L (ref 135–145)
Total Bilirubin: 0.4 mg/dL (ref 0.3–1.2)
Total Protein: 7 g/dL (ref 6.5–8.1)

## 2022-07-28 LAB — TYPE AND SCREEN
ABO/RH(D): A POS
Antibody Screen: NEGATIVE

## 2022-07-28 LAB — CBC
HCT: 39 % (ref 36.0–46.0)
Hemoglobin: 12.5 g/dL (ref 12.0–15.0)
MCH: 28.3 pg (ref 26.0–34.0)
MCHC: 32.1 g/dL (ref 30.0–36.0)
MCV: 88.4 fL (ref 80.0–100.0)
Platelets: 219 10*3/uL (ref 150–400)
RBC: 4.41 MIL/uL (ref 3.87–5.11)
RDW: 14.9 % (ref 11.5–15.5)
WBC: 4.7 10*3/uL (ref 4.0–10.5)
nRBC: 0 % (ref 0.0–0.2)

## 2022-07-28 LAB — GLUCOSE, CAPILLARY: Glucose-Capillary: 211 mg/dL — ABNORMAL HIGH (ref 70–99)

## 2022-07-28 NOTE — Progress Notes (Signed)
PCP - Melina Schools sanders Cardiologist - Sunit Tolia   PPM/ICD - denies  Chest x-ray - n/a EKG - 07/05/22 Stress Test - 11/03/2018 ECHO - 07/15/21 Cardiac Cath - 08/18/21  Sleep Study - denies   Fasting Blood Sugar - 120-140 Checks Blood Sugar twice a day  Does not take ozempic anymore  Stop aspirin 7 days prior to surgery per Dr. Odis Hollingshead  ERAS Protcol -yes PRE-SURGERY Ensure or G2-  3 g2 ordered and given. 2HS and 1AM  COVID TEST- not needed   Anesthesia review: yes, cardiac history with pre op clearance  Patient denies shortness of breath, fever, cough and chest pain at PAT appointment   All instructions explained to the patient, with a verbal understanding of the material. Patient agrees to go over the instructions while at home for a better understanding. Patient also instructed to self quarantine after being tested for COVID-19. The opportunity to ask questions was provided.

## 2022-07-28 NOTE — Pre-Procedure Instructions (Signed)
Surgical Instructions    Your procedure is scheduled on Thursday,  Aug 05, 2022  Report to [facility] Main Entrance "A" at 0900 A.M., then check in with the Admitting office.  Call this number if you have problems the morning of surgery:  [phone]  If you have any questions prior to your surgery date call [phone]: Open Monday-Friday 8am-4pm If you experience any cold or flu symptoms such as cough, fever, chills, shortness of breath, etc. between now and your scheduled surgery, please notify us at the above number.     Remember:  Do not eat after midnight the night before your surgery  You may drink clear liquids until 8AM the morning of your surgery.   Clear liquids allowed are: Water, Non-Citrus Juices (without pulp), Carbonated Beverages, Clear Tea, Black Coffee Only (NO MILK, CREAM OR POWDERED CREAMER of any kind), and Gatorade.  Patient Instructions  The night before surgery:  No food after midnight. ONLY clear liquids after midnight             Drink TWO (2) 12 oz G2 given to you in your pre admission testing appointment.   The day of surgery (if you have diabetes): Drink ONE (1) 12 oz G2 given to you in your pre admission testing appointment by 8 AM the morning of surgery. Drink in one sitting. Do not sip.  This drink was given to you during your hospital  pre-op appointment visit.  Nothing else to drink after completing the  12 oz bottle of G2.         If you have questions, please contact your surgeon's office.     Take these medicines the morning of surgery with A SIP OF WATER : albuterol (PROAIR HFA) inhaler as needed  cetirizine (ZYRTEC ALLERGY)  amLODipine (NORVASC)  carvedilol (COREG)  ezetimibe (ZETIA)  ondansetron (ZOFRAN) as needed pantoprazole (PROTONIX)  rosuvastatin (CRESTOR)  traMADol (ULTRAM) as needed nitroGLYCERIN (NITROSTAT) as needed   Follow your surgeon's instructions on when to stop Aspirin.  If no instructions were given by your  surgeon then you will need to call the office to get those instructions.    As of today, STOP taking any Aleve, Naproxen, Ibuprofen, Motrin, Advil, Goody's, BC's, all herbal medications, fish oil, and all vitamins. This include celecoxib (CELEBREX).   WHAT DO I DO ABOUT MY DIABETES MEDICATION?   Hold FARXIGA 72 hours prior to surgery. Your last dose will be on May 12.   Hold Ozempic (if you still take it) for 1 week prior to your surgery. Last dose should be May 5.    HOW TO MANAGE YOUR DIABETES BEFORE AND AFTER SURGERY  Why is it important to control my blood sugar before and after surgery? Improving blood sugar levels before and after surgery helps healing and can limit problems. A way of improving blood sugar control is eating a healthy diet by:  Eating less sugar and carbohydrates  Increasing activity/exercise  Talking with your doctor about reaching your blood sugar goals High blood sugars (greater than 180 mg/dL) can raise your risk of infections and slow your recovery, so you will need to focus on controlling your diabetes during the weeks before surgery. Make sure that the doctor who takes care of your diabetes knows about your planned surgery including the date and location.  How do I manage my blood sugar before surgery? Check your blood sugar at least 4 times a day, starting 2 days before surgery, to make sure that  the level is not too high or low.  Check your blood sugar the morning of your surgery when you wake up and every 2 hours until you get to the Short Stay unit.  If your blood sugar is less than 70 mg/dL, you will need to treat for low blood sugar: Do not take insulin. Treat a low blood sugar (less than 70 mg/dL) with  cup of clear juice (cranberry or apple), 4 glucose tablets, OR glucose gel. Recheck blood sugar in 15 minutes after treatment (to make sure it is greater than 70 mg/dL). If your blood sugar is not greater than 70 mg/dL on recheck, call [phone]  for further instructions. Report your blood sugar to the short stay nurse when you get to Short Stay.  If you are admitted to the hospital after surgery: Your blood sugar will be checked by the staff and you will probably be given insulin after surgery (instead of oral diabetes medicines) to make sure you have good blood sugar levels. The goal for blood sugar control after surgery is 80-180 mg/dL.                      Do NOT Smoke (Tobacco/Vaping) for 24 hours prior to your procedure.  If you use a CPAP at night, you may bring your mask/headgear for your overnight stay.   Contacts, glasses, piercing's, hearing aid's, dentures or partials may not be worn into surgery, please bring cases for these belongings.    For patients admitted to the hospital, discharge time will be determined by your treatment team.   Patients discharged the day of surgery will not be allowed to drive home, and someone needs to stay with them for 24 hours.  SURGICAL WAITING ROOM VISITATION Patients having surgery or a procedure may have no more than 2 support people in the waiting area - these visitors may rotate.   Children under the age of 48 must have an adult with them who is not the patient. If the patient needs to stay at the hospital during part of their recovery, the visitor guidelines for inpatient rooms apply. Pre-op nurse will coordinate an appropriate time for 1 support person to accompany patient in pre-op.  This support person may not rotate.   Please refer to the [facility] website for the visitor guidelines for Inpatients (after your surgery is over and you are in a regular room).    Special instructions:   [place]- Preparing For Surgery  Before surgery, you can play an important role. Because skin is not sterile, your skin needs to be as free of germs as possible. You can reduce the number of germs on your skin by washing with CHG (chlorahexidine gluconate) Soap before surgery.  CHG is an  antiseptic cleaner which kills germs and bonds with the skin to continue killing germs even after washing.    Oral Hygiene is also important to reduce your risk of infection.  Remember - BRUSH YOUR TEETH THE MORNING OF SURGERY WITH YOUR REGULAR TOOTHPASTE  Please do not use if you have an allergy to CHG or antibacterial soaps. If your skin becomes reddened/irritated stop using the CHG.  Do not shave (including legs and underarms) for at least 48 hours prior to first CHG shower. It is OK to shave your face.  Please follow these instructions carefully.   Shower the NIGHT BEFORE SURGERY and the MORNING OF SURGERY  If you chose to wash your hair, wash your hair first  as usual with your normal shampoo.  After you shampoo, rinse your hair and body thoroughly to remove the shampoo.  Use CHG Soap as you would any other liquid soap. You can apply CHG directly to the skin and wash gently with a scrungie or a clean washcloth.   Apply the CHG Soap to your body ONLY FROM THE NECK DOWN.  Do not use on open wounds or open sores. Avoid contact with your eyes, ears, mouth and genitals (private parts). Wash Face and genitals (private parts)  with your normal soap.   Wash thoroughly, paying special attention to the area where your surgery will be performed.  Thoroughly rinse your body with warm water from the neck down.  DO NOT shower/wash with your normal soap after using and rinsing off the CHG Soap.  Pat yourself dry with a CLEAN TOWEL.  Wear CLEAN PAJAMAS to bed the night before surgery  Place CLEAN SHEETS on your bed the night before your surgery  DO NOT SLEEP WITH PETS.   Day of Surgery: Take a shower with CHG soap. Do not wear jewelry or makeup Do not wear lotions, powders, perfumes/colognes, or deodorant. Do not shave 48 hours prior to surgery.  Men may shave face and neck. Do not bring valuables to the hospital.  [facility] is not responsible for any belongings or valuables. Do not  wear nail polish, gel polish, artificial nails, or any other type of covering on natural nails (fingers and toes) If you have artificial nails or gel coating that need to be removed by a nail salon, please have this removed prior to surgery. Artificial nails or gel coating may interfere with anesthesia's ability to adequately monitor your vital signs. Wear Clean/Comfortable clothing the morning of surgery Remember to brush your teeth WITH YOUR REGULAR TOOTHPASTE.   Please read over the following fact sheets that you were given.    If you received a COVID test during your pre-op visit  it is requested that you wear a mask when out in public, stay away from anyone that may not be feeling well and notify your surgeon if you develop symptoms. If you have been in contact with anyone that has tested positive in the last 10 days please notify you surgeon.

## 2022-07-29 NOTE — Anesthesia Preprocedure Evaluation (Addendum)
Anesthesia Evaluation  Patient identified by MRN, date of birth, ID band Patient awake    Reviewed: Allergy & Precautions, NPO status , Patient's Chart, lab work & pertinent test results  Airway Mallampati: I  TM Distance: >3 FB Neck ROM: Full    Dental  (+) Teeth Intact, Dental Advisory Given   Pulmonary former smoker   breath sounds clear to auscultation       Cardiovascular hypertension, Pt. on medications and Pt. on home beta blockers + CAD   Rhythm:Regular Rate:Normal  Echo: Normal LV systolic function with EF 63%. Left ventricle cavity is normal  in size. Mild concentric remodeling of the left ventricle. Normal global  wall motion. Normal diastolic filling pattern. Calculated EF 63%.  Left atrial cavity is mildly dilated by volume.  Structurally normal tricuspid valve.  Mild tricuspid regurgitation. No  evidence of pulmonary hypertension.     Neuro/Psych  negative psych ROS   GI/Hepatic Neg liver ROS, hiatal hernia, PUD,GERD  ,,  Endo/Other  diabetes    Renal/GU Renal disease     Musculoskeletal  (+) Arthritis , Rheumatoid disorders,    Abdominal   Peds  Hematology   Anesthesia Other Findings   Reproductive/Obstetrics                             Anesthesia Physical Anesthesia Plan  ASA: 2  Anesthesia Plan: General   Post-op Pain Management: Tylenol PO (pre-op)*   Induction: Intravenous  PONV Risk Score and Plan: 4 or greater and Ondansetron, Dexamethasone, Midazolam and Treatment may vary due to age or medical condition  Airway Management Planned: Oral ETT  Additional Equipment: Arterial line  Intra-op Plan:   Post-operative Plan: Extubation in OR  Informed Consent: I have reviewed the patients History and Physical, chart, labs and discussed the procedure including the risks, benefits and alternatives for the proposed anesthesia with the patient or authorized  representative who has indicated his/her understanding and acceptance.     Dental advisory given  Plan Discussed with: CRNA  Anesthesia Plan Comments: (PAT note written 07/29/2022 by Shonna Chock, PA-C.  )       Anesthesia Quick Evaluation

## 2022-07-29 NOTE — Progress Notes (Signed)
Anesthesia Chart Review:  Case: [id number] Date/Time: 08/05/22 1045   Procedures:      LAPAROSCOPIC ASSISTED, POSSIBLE OPEN, EXCISION OF MESENTERIC MASS     LAPAROSCOPIC SMALL BOWEL RESECTION   Anesthesia type: General   Pre-op diagnosis: MESENTERIC MASS   [facility] / [facility]   Surgeons: Fritzi Mandes, MD       DISCUSSION: Patient is a 67 year old female scheduled for the above procedure.  History includes former smoker (quit 09/10/74), HTN, HLD, CAD (70% LAD, medical therapy 07/2021), DM2, gastroparesis, RA, CKD (stage 3), hiatal hernia, GERD, non-alcoholic fatty liver disease, osteoarthritis (left TKA 10/27/20), left breast papilloma (s/p left breast lumpectomy 02/05/22). BMI is consistent with obesity.     Cardiology preoperative evaluation on 07/05/22 by Dr. Odis Hollingshead. She was first seen there in April 2023 for SOB and chest tightness. "Her ischemic workup included a coronary CTA in May 2023 which noted disease in the LAD. She underwent left heart catheterization thereafter which we illustrated similar findings. However with up titration of antianginal therapy and her becoming asymptomatic the shared decision was to treated medically. Since then patient has been on antianginal therapy and has not had any reoccurrence of chest pain." Per Dr. Odis Hollingshead: "Preop cardiovascular exam Patient being considered for excision of mesenteric mass followed by possible small bowel resection. EKG today illustrates sinus bradycardia with T wave inversions also noted on prior studies No use of sublingual nitroglycerin tablets. Compliant with current medical therapy. Exercises upto 60 minutes/day at the gym. No longer on insulin  She is acceptable risk for her upcoming non-cardiac surgery.  I have asked her to hold aspirin for at least 7 days prior to surgery if recommended by her surgeon. Have asked her to hold Farxiga 3 days prior to surgery. She can resume aspirin and Farxiga when cleared by her  provider from surgery."  Ozempic is listed as not currently taking.   Anesthesia team to evaluate on the day of surgery.   VS: BP (!) 162/60   Pulse (!) 52   Temp 36.7 C (Oral)   Resp 18   Ht 5\' 4"  (1.626 m)   Wt 84.4 kg   SpO2 100%   BMI 31.94 kg/m    PROVIDERS: Dorothyann Peng, MD is PCP  Tessa Lerner, DO is cardiologist. Previously had a preoperative evaluation on 07/18/20 with Rollene Rotunda, MD and and negative stress test in 2020 per Kennon Rounds, MD with ([facility]).   LABS: Labs reviewed: Acceptable for surgery. A1c 6.8% 07/05/22. (all labs ordered are listed, but only abnormal results are displayed)  Labs Reviewed  GLUCOSE, CAPILLARY - Abnormal; Notable for the following components:      Result Value   Glucose-Capillary 211 (*)    All other components within normal limits  COMPREHENSIVE METABOLIC PANEL - Abnormal; Notable for the following components:   Glucose, Bld 208 (*)    Creatinine, Ser 1.25 (*)    GFR, Estimated 47 (*)    All other components within normal limits  CBC  TYPE AND SCREEN    IMAGES: CT Abd/pelvis 05/22/22: IMPRESSION: 1. No acute intra-abdominal or pelvic pathology. 2. A 3.0 x 2.5 cm solitary mesenteric mass as above. Further characterization with MRI without and with contrast is recommended. 3. No bowel obstruction. Normal appendix. 4.  Aortic Atherosclerosis (ICD10-I70.0).   EKG: 07/05/2022: Sinus bradycardia, 56 bpm, TWI in lateral leads consider possible ischemia, without underlying injury pattern.  Since last office visit rate is lower and  TWI noticable.    CV: Cardiac cath 08/18/2021: LM: Normal LAD: Prox calcific 30% disease         Mid, focal, eccentric 70% stenosis Lcx: No significant disease RCA: Ostial 40% stenosis   CTA FFR noted to be 0.55 in distal LAD, with normal numbers in prox and mid LAD. This value is likely due to mid LAD stenosis. Patient reports bike exercise as recently as yesterday for 20 min without any  chest pain or shortness of breath. She reports her symptoms have stopped after stopping a certain medication prescribed by her ?nephrologist. While I am not sure about this reasoning, it is possible that resolution of her chest pain symptoms may correlate with metoprolol started by Dr. Odis Hollingshead. With specific questioning re: continued medical therapy or addition of revascularization, patient opted medical therapy. In absence of any angina or angina equivalent symptoms on excellent medical therapy, reasonable to continue the same at that time.  In future, if symptoms increase, could consider PCI to mid LAD.    CT Coronary w/ FFR 07/26/2021: 1. Left Main: FFR = 1 2. LAD: Proximal FFR = 0.98, mid FFR = 0.93, distal FFR = 0.55 3. LCX: Proximal FFR = 0.99, distal FFR = 0.96 4. RCA: Proximal FFR = 0.97, mid FFR =0.88, distal FFR = 0.82   IMPRESSION: 1. CT FFR analysis illustrates hemodynamically significant stenosis within the mid LAD (distal to second diagonal branch).   RECOMMENDATIONS: Goal directed medical and anti-anginal therapy and aggressive risk factor modification for secondary prevention of coronary artery disease.   Invasive angiography should be consider highly given the CTFFR findings in appropriate clinical setting.   Clinical correlation required.   Echocardiogram 07/15/2021: Normal LV systolic function with EF 63%. Left ventricle cavity is normal in size. Mild concentric remodeling of the left ventricle. Normal global wall motion. Normal diastolic filling pattern. Calculated EF 63%. Left atrial cavity is mildly dilated by volume. Structurally normal tricuspid valve.  Mild tricuspid regurgitation. No evidence of pulmonary hypertension.    Past Medical History:  Diagnosis Date   CKD (chronic kidney disease)    stage 3   Coronary artery disease 07/2021   obstructive CAD-70% LAD-treated medically   DM (diabetes mellitus) (HCC)    Fatty liver disease, nonalcoholic     Gastritis    Gastroparesis    GERD (gastroesophageal reflux disease)    Heartburn    Hiatal hernia    HLD (hyperlipidemia)    HTN (hypertension)    Joint pain    Renal calculus    Rheumatoid arthritis (HCC)    Vitamin D deficiency     Past Surgical History:  Procedure Laterality Date   BREAST BIOPSY  02/04/2022   MM LT RADIOACTIVE SEED EA ADD LESION LOC MAMMO GUIDE 02/04/2022 [facility]   BREAST BIOPSY  02/04/2022   MM LT RADIOACTIVE SEED LOC MAMMO GUIDE 02/04/2022 [facility]   BREAST LUMPECTOMY WITH RADIOACTIVE SEED LOCALIZATION Left 02/05/2022   Procedure: LEFT BREAST BRACKETED LUMPECTOMY WITH RADIOACTIVE SEED LOCALIZATION;  Surgeon: Griselda Miner, MD;  Location: [facility];  Service: General;  Laterality: Left;   CHOLECYSTECTOMY     COLONOSCOPY  09/10/2011   normal   JOINT REPLACEMENT  2022   left TKA   LEFT HEART CATH AND CORONARY ANGIOGRAPHY N/A 08/18/2021   Procedure: LEFT HEART CATH AND CORONARY ANGIOGRAPHY;  Surgeon: Elder Negus, MD;  [facility];  Service: Cardiovascular;  Laterality: N/A;   TOTAL KNEE ARTHROPLASTY  Left 10/27/2020   Procedure: LEFT TOTAL KNEE ARTHROPLASTY;  Surgeon: Tarry Kos, MD;  Location: [facility];  Service: Orthopedics;  Laterality: Left;   TUBAL LIGATION     UPPER GASTROINTESTINAL ENDOSCOPY      MEDICATIONS:  amLODipine (NORVASC) 10 MG tablet   aspirin EC 81 MG tablet   benzonatate (TESSALON PERLES) 100 MG capsule   Blood Glucose Monitoring Suppl (ONETOUCH VERIO) w/Device KIT   carvedilol (COREG) 3.125 MG tablet   celecoxib (CELEBREX) 200 MG capsule   cetirizine (ZYRTEC ALLERGY) 10 MG tablet   clindamycin (CLEOCIN T) 1 % lotion   EDARBI 80 MG TABS   ezetimibe (ZETIA) 10 MG tablet   FARXIGA 10 MG TABS tablet   ferrous gluconate (FERGON) 324 MG tablet   glucose blood (ONETOUCH VERIO) test strip   linaclotide ([name]) 145 MCG CAPS capsule   nitroGLYCERIN (NITROSTAT) 0.4 MG SL  tablet   ondansetron (ZOFRAN) 4 MG tablet   OneTouch Delica Lancets 33G MISC   OZEMPIC, 2 MG/DOSE, 8 MG/3ML SOPN   pantoprazole (PROTONIX) 40 MG tablet   rosuvastatin (CRESTOR) 20 MG tablet   sucralfate (CARAFATE) 1 g tablet   temazepam (RESTORIL) 30 MG capsule   TRUEPLUS PEN NEEDLES 31G X 6 MM MISC   No current facility-administered medications for this encounter.   Shonna Chock, PA-C Surgical Short Stay/Anesthesiology [facility] - Cl[facility] Phone [phone] [facility] Phone [phone] 07/29/2022 6:18 PM

## 2022-07-30 ENCOUNTER — Telehealth: Payer: Self-pay

## 2022-07-30 NOTE — Progress Notes (Unsigned)
Care Management & Coordination Services Pharmacy Team  Reason for Encounter: Medication coordination and delivery  Contacted patient to discuss medications and coordinate delivery from [facility]. Spoke with patient on 07/30/2022  Cycle dispensing form sent to Cherylin Mylar for review.   Last adherence delivery date: 07-14-2022  Patient is due for next adherence delivery on: 08-11-2022  This delivery to include: Vials  30 Days  Farxiga 10mg  1 tablet daily Edarbi 80 mg- 1 tablet daily  Fergon 324 mg  2 times a day  Ozempic 2 mg weekly  Amlodipine 10 mg daily Rosuvastatin 20 mg daily Carvedilol 3.125 mg twice daily Pantoprazole 40 mg daily  Aspirin 81 mg daily Cetirizine 10 mg daily Zetia 10 mg daily  Temazepam 30 mg at night PRN Zofran 4 mg every 4 hours PRN  [name] 145 mcg PRN- Add to delivery Dr. Sharee Holster office is closed today to request new prescription. Will call back on 05-13 during business hours. Spoke wit Tory at the office and they will give patient a call later to confirm refill needed.  Patient declined the following medications this month: Celebrex- Plenty supply  Albuterol- [facility] Sucralfate- [facility] at [facility] Nitrostat- plenty supply  Refills requested from providers include: Pantoprazole  Zofran  Confirmed delivery date of 08-11-2022, advised patient that pharmacy will contact them the morning of delivery.   Any concerns about your medications? No  How often do you forget or accidentally miss a dose? Never  Do you use a pillbox? Yes  Is patient in packaging No   Recent blood pressure readings are as follows: Patient was out away from log.  Recent blood glucose readings are as follows:Patient was out away from log.   Chart review: Recent office visits:  07-05-2022 Dorothyann Peng, MD. Follow up visit for diabetes/HTN. No changes.  Recent consult visits:  07-28-2022 Fritzi Mandes, MD. Pre admission testing.  07-21-2022 [place],  Thermon Leyland, FNP (Weight and wellness). Follow visit no changes.   07-06-2022 Tarry Kos, MD (Orthopedic surgery). Visit for status post total left knee replacement.  07-05-2022 Tessa Lerner, DO (Cardiology). Preop cardiovascular exam. EKG completed.  06-24-2022 Tarry Kos, MD (Orthopedic surgery). Visit for status post total left knee replacement. XR knee 1-2 views left completed.  06-17-2022 Jeoffrey Massed, MD (General surgery). Visit for preop procedure.  Hospital visits:  None in previous 6 months  Medications: Outpatient Encounter Medications as of 07/30/2022  Medication Sig   amLODipine (NORVASC) 10 MG tablet TAKE ONE TABLET BY MOUTH ONCE DAILY   aspirin EC 81 MG tablet Take 1 tablet (81 mg total) by mouth daily. Swallow whole.   benzonatate (TESSALON PERLES) 100 MG capsule Take 1 capsule (100 mg total) by mouth 3 (three) times daily as needed for cough. (Patient not taking: Reported on 07/05/2022)   Blood Glucose Monitoring Suppl (ONETOUCH VERIO) w/Device KIT Use as directed to check blood sugars 2 times per day dx: e11.65   carvedilol (COREG) 3.125 MG tablet TAKE ONE TABLET BY MOUTH TWICE DAILY WITH A MEAL   celecoxib (CELEBREX) 200 MG capsule Take 1 capsule (200 mg total) by mouth 2 (two) times daily as needed.   cetirizine (ZYRTEC ALLERGY) 10 MG tablet Take 1 tablet (10 mg total) by mouth daily.   clindamycin (CLEOCIN T) 1 % lotion Apply 1 Application topically daily as needed (acne).   EDARBI 80 MG TABS TAKE ONE TABLET BY MOUTH ONCE DAILY   ezetimibe (ZETIA) 10 MG tablet Take 1 tablet (  10 mg total) by mouth daily. (Patient not taking: Reported on 07/27/2022)   FARXIGA 10 MG TABS tablet TAKE ONE TABLET BY MOUTH ONCE DAILY BEFORE BREAKFAST   ferrous gluconate (FERGON) 324 MG tablet TAKE 1 TABLET BY MOUTH TWICE DAILY WITH A MEAL.   glucose blood (ONETOUCH VERIO) test strip USE TO check blood glucose TWICE DAILY AS DIRECTED   linaclotide ([name]) 145 MCG CAPS capsule Take 1  capsule (145 mcg total) by mouth daily. (Patient taking differently: Take 145 mcg by mouth daily as needed (constipation).)   nitroGLYCERIN (NITROSTAT) 0.4 MG SL tablet Place 1 tablet (0.4 mg total) under the tongue every 5 (five) minutes as needed for chest pain. If you require more than two tablets five minutes apart go to the nearest ER via EMS.   ondansetron (ZOFRAN) 4 MG tablet Take 1 tablet (4 mg total) by mouth every 8 (eight) hours as needed for nausea or vomiting.   OneTouch Delica Lancets 33G MISC Use as directed to check blood sugars 2 times per day dx:e11.65   OZEMPIC, 2 MG/DOSE, 8 MG/3ML SOPN Inject 2mg  into THE SKIN ONCE WEEKLY (Patient not taking: Reported on 07/05/2022)   pantoprazole (PROTONIX) 40 MG tablet Take 1 tablet (40 mg total) by mouth 2 (two) times daily.   rosuvastatin (CRESTOR) 20 MG tablet TAKE ONE TABLET BY MOUTH ONCE DAILY   sucralfate (CARAFATE) 1 g tablet Take 1 tablet (1 g total) by mouth 4 (four) times daily -  with meals and at bedtime. (Patient not taking: Reported on 07/05/2022)   temazepam (RESTORIL) 30 MG capsule TAKE ONE CAPSULE BY MOUTH AT bedtime AS NEEDED   TRUEPLUS PEN NEEDLES 31G X 6 MM MISC USE WITH pen TO INJECT insulin DAILY   No facility-administered encounter medications on file as of 07/30/2022.   BP Readings from Last 3 Encounters:  07/28/22 (!) 162/60  07/05/22 (!) 140/78  07/05/22 138/80    Pulse Readings from Last 3 Encounters:  07/28/22 (!) 52  07/05/22 67  07/05/22 (!) 52    Lab Results  Component Value Date/Time   HGBA1C 6.8 (H) 07/05/2022 09:46 AM   HGBA1C 6.3 (H) 03/23/2022 10:32 AM   Lab Results  Component Value Date   CREATININE 1.25 (H) 07/28/2022   BUN 21 07/28/2022   GFR 47.43 (L) 12/23/2020   GFRNONAA 47 (L) 07/28/2022   GFRAA 57 (L) 02/21/2020   NA 137 07/28/2022   K 4.6 07/28/2022   CALCIUM 9.4 07/28/2022   CO2 25 07/28/2022   Care Management & Coordination Services Pharmacy Team  Reason for Encounter:  Appointment Reminder  Contacted patient to confirm telephone appointment with Cherylin Mylar, PharmD on 08-03-2022 at 3:00. Spoke with patient on 07/30/2022   Do you have any problems getting your medications? No  What is your top health concern you would like to discuss at your upcoming visit? Patient stated no concerns  Have you seen any other providers since your last visit with PCP? Yes  Star Rating Drugs:  Farxiga 10 mg- Last filled 07-14-2022 30 DS [facility]. Previous 05-27-2022 30 DS Rosuvastatin 20 mg- Last filled 07-14-2022 30 DS [facility]. Previous 05-27-2022 30 DS Ozempic 2 mg- Last filled 07-14-2022 28 [facility]. Previous 05-10-2022 28 DS [facility] Edarbi 80 mg- Last filled 07-14-2022 30 DS [facility]. Previous 05-27-2022 30 DS   Care Gaps: Annual wellness visit in last year? Yes Covid booster overdue  If Diabetic: Last eye exam / retinopathy screening: 03-09-2022 Last diabetic foot exam: none  [place] Pharmacist Assistant [phone]

## 2022-08-02 ENCOUNTER — Other Ambulatory Visit: Payer: Self-pay

## 2022-08-02 ENCOUNTER — Telehealth (INDEPENDENT_AMBULATORY_CARE_PROVIDER_SITE_OTHER): Payer: Self-pay | Admitting: Family Medicine

## 2022-08-02 DIAGNOSIS — K5904 Chronic idiopathic constipation: Secondary | ICD-10-CM

## 2022-08-02 DIAGNOSIS — K2970 Gastritis, unspecified, without bleeding: Secondary | ICD-10-CM

## 2022-08-02 MED ORDER — ONDANSETRON HCL 4 MG PO TABS: 4.0000 mg | ORAL_TABLET | Freq: Three times a day (TID) | ORAL | 0 refills | Status: DC | PRN

## 2022-08-02 MED ORDER — PANTOPRAZOLE SODIUM 40 MG PO TBEC
40.0000 mg | DELAYED_RELEASE_TABLET | Freq: Two times a day (BID) | ORAL | 3 refills | Status: DC
Start: 2022-08-02 — End: 2022-08-03

## 2022-08-02 MED ORDER — LINACLOTIDE 145 MCG PO CAPS
145.0000 ug | ORAL_CAPSULE | Freq: Every day | ORAL | 0 refills | Status: AC
Start: 2022-08-02 — End: ?

## 2022-08-02 NOTE — Telephone Encounter (Signed)
Medication sent in. Patient informed.

## 2022-08-02 NOTE — Telephone Encounter (Signed)
Patient doctor office is calling to request a refill on Linaclotide. Patient was last seen on 07/21/2022 with [name]. Please call patient to either confirm or discuss next steps.

## 2022-08-03 ENCOUNTER — Other Ambulatory Visit: Payer: Self-pay | Admitting: Gastroenterology

## 2022-08-03 ENCOUNTER — Telehealth: Payer: Self-pay

## 2022-08-03 DIAGNOSIS — K2970 Gastritis, unspecified, without bleeding: Secondary | ICD-10-CM

## 2022-08-03 NOTE — Telephone Encounter (Signed)
  Care Management   Follow Up Note   08/03/2022 Name: Leslie Davis MRN: [id number] DOB: 04/06/1955   Referred by: Dorothyann Peng, MD Reason for referral : No chief complaint on file.   An unsuccessful telephone outreach was attempted today. The patient was referred to the case management team for assistance with care management and care coordination.   Follow Up Plan:  Patients mailbox is full unable to leave a message, will try to follow up with the patient again.   Cherylin Mylar, CPP, PharmD Clinical Pharmacist Practitioner [facility] [phone]

## 2022-08-03 NOTE — Progress Notes (Unsigned)
Care Management & Coordination Services Pharmacy Note  08/03/2022 Name:  Leslie Davis MRN:  [id number] DOB:  08/28/1955  Summary: ***  Recommendations/Changes made from today's visit: ***  Follow up plan: ***   Subjective: Leslie Davis is an 67 y.o. year old female who is a primary patient of Dorothyann Peng, MD.  The care coordination team was consulted for assistance with disease management and care coordination needs.    {CCMTELEPHONEFACETOFACE:21091510} for {CCMINITIALFOLLOWUPCHOICE:21091511}.  Recent office visits: ***  Recent consult visits: ***  Hospital visits: {Hospital DC Yes/No:25215}   Objective:  Lab Results  Component Value Date   CREATININE 1.25 (H) 07/28/2022   BUN 21 07/28/2022   GFR 47.43 (L) 12/23/2020   EGFR 54 (L) 07/05/2022   GFRNONAA 47 (L) 07/28/2022   GFRAA 57 (L) 02/21/2020   NA 137 07/28/2022   K 4.6 07/28/2022   CALCIUM 9.4 07/28/2022   CO2 25 07/28/2022   GLUCOSE 208 (H) 07/28/2022    Lab Results  Component Value Date/Time   HGBA1C 6.8 (H) 07/05/2022 09:46 AM   HGBA1C 6.3 (H) 03/23/2022 10:32 AM   GFR 47.43 (L) 12/23/2020 03:22 PM   GFR 77.09 11/17/2017 09:00 AM   MICROALBUR 150 11/15/2019 10:20 AM    Last diabetic Eye exam:  Lab Results  Component Value Date/Time   HMDIABEYEEXA No Retinopathy 03/09/2022 12:00 AM    Last diabetic Foot exam: No results found for: "HMDIABFOOTEX"   Lab Results  Component Value Date   CHOL 206 (H) 07/05/2022   HDL 109 07/05/2022   LDLCALC 87 07/05/2022   TRIG 52 07/05/2022   CHOLHDL 1.9 07/05/2022       Latest Ref Rng & Units 07/28/2022   10:00 AM 07/05/2022    9:46 AM 03/23/2022   10:32 AM  Hepatic Function  Total Protein 6.5 - 8.1 g/dL 7.0  7.0  7.0   Albumin 3.5 - 5.0 g/dL 3.5  4.2  4.2   AST 15 - 41 U/L 27  20  40   ALT 0 - 44 U/L 39  21  54   Alk Phosphatase 38 - 126 U/L 114  116  114   Total Bilirubin 0.3 - 1.2 mg/dL 0.4  0.3  0.3     Lab Results  Component Value  Date/Time   TSH 1.640 06/17/2021 04:06 PM   TSH 1.790 07/23/2020 10:42 AM   FREET4 1.00 07/23/2020 10:42 AM       Latest Ref Rng & Units 07/28/2022   10:00 AM 06/24/2022   10:20 AM 03/23/2022   10:32 AM  CBC  WBC 4.0 - 10.5 K/uL 4.7  3.6  4.4   Hemoglobin 12.0 - 15.0 g/dL 40.9  81.1  91.4   Hematocrit 36.0 - 46.0 % 39.0  39.4  39.7   Platelets 150 - 400 K/uL 219  220  211     Lab Results  Component Value Date/Time   VD25OH 32.5 07/05/2022 09:46 AM   VD25OH 48.3 09/14/2021 02:38 PM   VITAMINB12 628 12/02/2021 10:56 AM   VITAMINB12 549 06/17/2021 04:06 PM    Clinical ASCVD: {YES/NO:21197} The ASCVD Risk score (Arnett DK, et al., 2019) failed to calculate for the following reasons:   The valid HDL cholesterol range is 20 to 100 mg/dL    ***Other: ([id number] if Afib, MMRC or CAT for COPD, ACT, DEXA)     07/05/2022    8:55 AM 12/19/2021    1:39 PM 12/19/2021    1:37 PM  Depression screen PHQ 2/9  Decreased Interest 0 0 0  Down, Depressed, Hopeless 0 0 0  PHQ - 2 Score 0 0 0  Altered sleeping 0    Tired, decreased energy 0    Change in appetite 0    Feeling bad or failure about yourself  0    Trouble concentrating 0    Moving slowly or fidgety/restless 0    Suicidal thoughts 0    PHQ-9 Score 0    Difficult doing work/chores Not difficult at all       Social History   Tobacco Use  Smoking Status Former   Packs/day: 0.25   Years: 1.00   Additional pack years: 0.00   Total pack years: 0.25   Types: Cigarettes   Quit date: 09/10/1974   Years since quitting: 47.9  Smokeless Tobacco Never  Tobacco Comments   she no longer smokes.    BP Readings from Last 3 Encounters:  07/28/22 (!) 162/60  07/05/22 (!) 140/78  07/05/22 138/80   Pulse Readings from Last 3 Encounters:  07/28/22 (!) 52  07/05/22 67  07/05/22 (!) 52   Wt Readings from Last 3 Encounters:  07/28/22 186 lb 1.6 oz (84.4 kg)  07/05/22 182 lb 12.8 oz (82.9 kg)  07/05/22 181 lb 3.2 oz (82.2 kg)   BMI  Readings from Last 3 Encounters:  07/28/22 31.94 kg/m  07/05/22 33.43 kg/m  07/05/22 33.14 kg/m    Allergies  Allergen Reactions   Fiasp [Insulin Aspart (W-Niacinamide)] Hives    Medications Reviewed Today     Reviewed by Amalia Greenhouse, RN (Registered Nurse) on 07/28/22 at [phone]  Med List Status: <None>   Medication Order Taking? Sig Documenting Provider Last Dose Status Informant  amLODipine (NORVASC) 10 MG tablet [id number]  TAKE ONE TABLET BY MOUTH ONCE DAILY Arnette Felts, FNP  Active Self  aspirin EC 81 MG tablet [id number]  Take 1 tablet (81 mg total) by mouth daily. Swallow whole. Dorothyann Peng, MD  Active Self  benzonatate (TESSALON PERLES) 100 MG capsule [id number]  Take 1 capsule (100 mg total) by mouth 3 (three) times daily as needed for cough.  Patient not taking: Reported on 07/05/2022   Dorothyann Peng, MD  Active Self  Blood Glucose Monitoring Suppl [facility] VERIO) w/Device KIT [id number]  Use as directed to check blood sugars 2 times per day dx: e11.65 Dorothyann Peng, MD  Active Self  carvedilol (COREG) 3.125 MG tablet [id number]  TAKE ONE TABLET BY MOUTH TWICE DAILY WITH A MEAL Tolia, Sunit, DO  Active Self  celecoxib (CELEBREX) 200 MG capsule [id number]  Take 1 capsule (200 mg total) by mouth 2 (two) times daily as needed. Cristie Hem, PA-C  Active Self  cetirizine (ZYRTEC ALLERGY) 10 MG tablet [id number]  Take 1 tablet (10 mg total) by mouth daily. Dorothyann Peng, MD  Active Self  clindamycin (CLEOCIN T) 1 % lotion [id number]  Apply 1 Application topically daily as needed (acne). [provider]  Active Self  EDARBI 80 MG TABS [id number]  TAKE ONE TABLET BY MOUTH ONCE DAILY Dorothyann Peng, MD  Active Self  ezetimibe (ZETIA) 10 MG tablet [id number]  Take 1 tablet (10 mg total) by mouth daily.  Patient not taking: Reported on 07/27/2022   Tessa Lerner, DO  Active Self  FARXIGA 10 MG TABS tablet [id number]  TAKE ONE TABLET BY MOUTH ONCE DAILY BEFORE Kathe Mariner, MD  Active Self  ferrous gluconate (FERGON) 324 MG tablet [id number]  TAKE 1 TABLET BY MOUTH TWICE DAILY WITH A MEAL. Mansouraty, Netty Starring., MD  Active Self  glucose blood [facility] VERIO) test strip [id number]  USE TO check blood glucose TWICE DAILY AS DIRECTED Dorothyann Peng, MD  Active Self  linaclotide [facility]) 145 MCG CAPS capsule [id number]  Take 1 capsule (145 mcg total) by mouth daily.  Patient taking differently: Take 145 mcg by mouth daily as needed (constipation).   Thomasene Lot, DO  Active Self  nitroGLYCERIN (NITROSTAT) 0.4 MG SL tablet [id number]  Place 1 tablet (0.4 mg total) under the tongue every 5 (five) minutes as needed for chest pain. If you require more than two tablets five minutes apart go to the nearest ER via EMS. Tolia, Sunit, DO  Expired 07/27/22 2359 Self  ondansetron (ZOFRAN) 4 MG tablet [id number]  Take 1 tablet (4 mg total) by mouth every 8 (eight) hours as needed for nausea or vomiting. Dorothyann Peng, MD  Active Self  OneTouch Delica Lancets 33G [place] [id number]  Use as directed to check blood sugars 2 times per day dx:e11.65 Dorothyann Peng, MD  Active Self  [facility], 2 MG/DOSE, 8 MG/3ML SOPN [id number]  Inject 2mg  into THE SKIN ONCE WEEKLY  Patient not taking: Reported on 07/05/2022   Dorothyann Peng, MD  Active Self  pantoprazole (PROTONIX) 40 MG tablet [id number]  Take 1 tablet (40 mg total) by mouth 2 (two) times daily. Mansouraty, Netty Starring., MD  Active Self  rosuvastatin (CRESTOR) 20 MG tablet [id number]  TAKE ONE TABLET BY MOUTH ONCE DAILY Dorothyann Peng, MD  Active Self  sucralfate (CARAFATE) 1 g tablet [id number]  Take 1 tablet (1 g total) by mouth 4 (four) times daily -  with meals and at bedtime.  Patient not taking: Reported on 07/05/2022   Mansouraty, Netty Starring., MD  Active Self  temazepam (RESTORIL) 30 MG capsule [id number]  TAKE ONE CAPSULE BY MOUTH AT bedtime AS NEEDED Dorothyann Peng, MD  Active Self  TRUEPLUS PEN NEEDLES 31G X 6 MM MISC  [id number]  USE WITH pen TO INJECT insulin DAILY Arnette Felts, FNP  Active Self            SDOH:  (Social Determinants of Health) assessments and interventions performed: {yes/no:20286} SDOH Interventions    Flowsheet Row Office Visit from 04/20/2022 in [facility] Office Visit from 03/23/2022 in [facility] Office Visit from 12/02/2021 in [facility] Office Visit from 09/16/2021 in [facility] Office Visit from 08/06/2020 in [facility] Management from 11/27/2019 in [facility]  SDOH Interventions        Depression Interventions/Treatment  PHQ2-9 Score <4 Follow-up Not Indicated PHQ2-9 Score <4 Follow-up Not Indicated PHQ2-9 Score <4 Follow-up Not Indicated PHQ2-9 Score <4 Follow-up Not Indicated Counseling --  Financial Strain Interventions -- -- -- -- -- Intervention Not Indicated       Medication Assistance: {MEDASSISTANCEINFO:25044}  Medication Access: Within the past 30 days, how often has patient missed a dose of medication? *** Is a pillbox or other method used to improve adherence? {YES/NO:21197} Factors that may affect medication adherence? {CHL DESC; BARRIERS:21522} Are meds synced by current pharmacy? {YES/NO:21197} Are meds delivered by current pharmacy? {YES/NO:21197} Does patient experience delays in picking up medications due to transportation concerns? {YES/NO:21197}  [facility] Reviewed: Is patient disadvantaged to use [facility]?: {YES/NO:21197} Current Rx insurance plan: ***  Name and location of Current pharmacy:  [facility] - [place], [place] - [address] [place] [address] Phone: [phone] Fax: [phone]  [facility] [place], [place] - [address] [place] [address] Phone: [phone] Fax: [phone]  [id number] [place], [place] - [address] [place] [address] Phone: [phone] Fax: [phone]  [facility] services reviewed with patient today?: {YES/NO:21197} Patient requests to transfer care to [facility]?: {YES/NO:21197} Reason patient declined to change pharmacies: {US patient preference:27474}  Compliance/Adherence/Medication fill history: Care Gaps: ***  Star-Rating Drugs: ***   Assessment/Plan   {CCM PHARMD DISEASE STATES:25130}  ***

## 2022-08-05 ENCOUNTER — Encounter (HOSPITAL_COMMUNITY)
Admission: RE | Disposition: A | Discharge status: Home / Self Care | Payer: Self-pay | Source: Home / Self Care | Attending: Surgery

## 2022-08-05 ENCOUNTER — Inpatient Hospital Stay (HOSPITAL_COMMUNITY): Payer: 59 | Admitting: General Practice

## 2022-08-05 ENCOUNTER — Other Ambulatory Visit: Payer: Self-pay

## 2022-08-05 ENCOUNTER — Inpatient Hospital Stay (HOSPITAL_COMMUNITY)
Admission: RE | Admit: 2022-08-05 | Discharge: 2022-08-09 | DRG: 828 | Disposition: A | Discharge status: Home / Self Care | Payer: 59 | Attending: Surgery | Admitting: Surgery

## 2022-08-05 ENCOUNTER — Inpatient Hospital Stay (HOSPITAL_COMMUNITY): Payer: 59 | Admitting: Vascular Surgery

## 2022-08-05 ENCOUNTER — Encounter (HOSPITAL_COMMUNITY): Payer: Self-pay | Admitting: Surgery

## 2022-08-05 DIAGNOSIS — Z841 Family history of disorders of kidney and ureter: Secondary | ICD-10-CM | POA: Diagnosis not present

## 2022-08-05 DIAGNOSIS — Z833 Family history of diabetes mellitus: Secondary | ICD-10-CM | POA: Diagnosis not present

## 2022-08-05 DIAGNOSIS — Z7982 Long term (current) use of aspirin: Secondary | ICD-10-CM

## 2022-08-05 DIAGNOSIS — Z79899 Other long term (current) drug therapy: Secondary | ICD-10-CM

## 2022-08-05 DIAGNOSIS — K219 Gastro-esophageal reflux disease without esophagitis: Secondary | ICD-10-CM | POA: Diagnosis not present

## 2022-08-05 DIAGNOSIS — Z9049 Acquired absence of other specified parts of digestive tract: Secondary | ICD-10-CM

## 2022-08-05 DIAGNOSIS — Z6834 Body mass index (BMI) 34.0-34.9, adult: Secondary | ICD-10-CM | POA: Diagnosis not present

## 2022-08-05 DIAGNOSIS — Z8711 Personal history of peptic ulcer disease: Secondary | ICD-10-CM | POA: Diagnosis not present

## 2022-08-05 DIAGNOSIS — E1165 Type 2 diabetes mellitus with hyperglycemia: Secondary | ICD-10-CM | POA: Diagnosis present

## 2022-08-05 DIAGNOSIS — Z8249 Family history of ischemic heart disease and other diseases of the circulatory system: Secondary | ICD-10-CM

## 2022-08-05 DIAGNOSIS — Z87891 Personal history of nicotine dependence: Secondary | ICD-10-CM | POA: Diagnosis not present

## 2022-08-05 DIAGNOSIS — I10 Essential (primary) hypertension: Secondary | ICD-10-CM | POA: Diagnosis not present

## 2022-08-05 DIAGNOSIS — K660 Peritoneal adhesions (postprocedural) (postinfection): Secondary | ICD-10-CM | POA: Diagnosis present

## 2022-08-05 DIAGNOSIS — R1900 Intra-abdominal and pelvic swelling, mass and lump, unspecified site: Secondary | ICD-10-CM | POA: Diagnosis present

## 2022-08-05 DIAGNOSIS — Z800 Family history of malignant neoplasm of digestive organs: Secondary | ICD-10-CM | POA: Diagnosis not present

## 2022-08-05 DIAGNOSIS — Z83438 Family history of other disorder of lipoprotein metabolism and other lipidemia: Secondary | ICD-10-CM

## 2022-08-05 DIAGNOSIS — E669 Obesity, unspecified: Secondary | ICD-10-CM | POA: Diagnosis present

## 2022-08-05 DIAGNOSIS — I129 Hypertensive chronic kidney disease with stage 1 through stage 4 chronic kidney disease, or unspecified chronic kidney disease: Secondary | ICD-10-CM | POA: Diagnosis not present

## 2022-08-05 DIAGNOSIS — E785 Hyperlipidemia, unspecified: Secondary | ICD-10-CM | POA: Diagnosis not present

## 2022-08-05 DIAGNOSIS — I2510 Atherosclerotic heart disease of native coronary artery without angina pectoris: Secondary | ICD-10-CM

## 2022-08-05 DIAGNOSIS — Z87442 Personal history of urinary calculi: Secondary | ICD-10-CM

## 2022-08-05 DIAGNOSIS — Z8349 Family history of other endocrine, nutritional and metabolic diseases: Secondary | ICD-10-CM

## 2022-08-05 DIAGNOSIS — C7A8 Other malignant neuroendocrine tumors: Secondary | ICD-10-CM | POA: Diagnosis not present

## 2022-08-05 DIAGNOSIS — E119 Type 2 diabetes mellitus without complications: Secondary | ICD-10-CM | POA: Diagnosis not present

## 2022-08-05 DIAGNOSIS — N183 Chronic kidney disease, stage 3 unspecified: Secondary | ICD-10-CM | POA: Diagnosis present

## 2022-08-05 DIAGNOSIS — K6389 Other specified diseases of intestine: Secondary | ICD-10-CM

## 2022-08-05 DIAGNOSIS — D490 Neoplasm of unspecified behavior of digestive system: Secondary | ICD-10-CM | POA: Diagnosis not present

## 2022-08-05 DIAGNOSIS — Z823 Family history of stroke: Secondary | ICD-10-CM

## 2022-08-05 DIAGNOSIS — C7B8 Other secondary neuroendocrine tumors: Secondary | ICD-10-CM | POA: Diagnosis not present

## 2022-08-05 DIAGNOSIS — Z888 Allergy status to other drugs, medicaments and biological substances status: Secondary | ICD-10-CM

## 2022-08-05 DIAGNOSIS — M069 Rheumatoid arthritis, unspecified: Secondary | ICD-10-CM | POA: Diagnosis present

## 2022-08-05 DIAGNOSIS — Z7984 Long term (current) use of oral hypoglycemic drugs: Secondary | ICD-10-CM

## 2022-08-05 DIAGNOSIS — Z825 Family history of asthma and other chronic lower respiratory diseases: Secondary | ICD-10-CM

## 2022-08-05 DIAGNOSIS — Z96652 Presence of left artificial knee joint: Secondary | ICD-10-CM | POA: Diagnosis present

## 2022-08-05 DIAGNOSIS — K760 Fatty (change of) liver, not elsewhere classified: Secondary | ICD-10-CM | POA: Diagnosis present

## 2022-08-05 DIAGNOSIS — E1169 Type 2 diabetes mellitus with other specified complication: Principal | ICD-10-CM | POA: Diagnosis present

## 2022-08-05 HISTORY — PX: LAPAROSCOPIC SMALL BOWEL RESECTION: SHX5929

## 2022-08-05 HISTORY — PX: LAPAROSCOPIC REMOVAL ABDOMINAL MASS: SHX6360

## 2022-08-05 LAB — GLUCOSE, CAPILLARY
Glucose-Capillary: 121 mg/dL — ABNORMAL HIGH (ref 70–99)
Glucose-Capillary: 131 mg/dL — ABNORMAL HIGH (ref 70–99)
Glucose-Capillary: 216 mg/dL — ABNORMAL HIGH (ref 70–99)
Glucose-Capillary: 228 mg/dL — ABNORMAL HIGH (ref 70–99)
Glucose-Capillary: 232 mg/dL — ABNORMAL HIGH (ref 70–99)
Glucose-Capillary: 237 mg/dL — ABNORMAL HIGH (ref 70–99)
Glucose-Capillary: 258 mg/dL — ABNORMAL HIGH (ref 70–99)
Glucose-Capillary: 335 mg/dL — ABNORMAL HIGH (ref 70–99)
Glucose-Capillary: 88 mg/dL (ref 70–99)

## 2022-08-05 SURGERY — REMOVAL, MASS, ABDOMEN, LAPAROSCOPIC
Anesthesia: General | Site: Abdomen

## 2022-08-05 MED ORDER — METRONIDAZOLE 500 MG/100ML IV SOLN
INTRAVENOUS | Status: AC
  Filled 2022-08-05: qty 100

## 2022-08-05 MED ORDER — FENTANYL CITRATE (PF) 250 MCG/5ML IJ SOLN
INTRAMUSCULAR | Status: DC | PRN
  Administered 2022-08-05: 50 ug via INTRAVENOUS
  Administered 2022-08-05 (×2): 25 ug via INTRAVENOUS
  Administered 2022-08-05: 100 ug via INTRAVENOUS

## 2022-08-05 MED ORDER — PHENYLEPHRINE 80 MCG/ML (10ML) SYRINGE FOR IV PUSH (FOR BLOOD PRESSURE SUPPORT)
PREFILLED_SYRINGE | INTRAVENOUS | Status: AC
  Filled 2022-08-05: qty 10

## 2022-08-05 MED ORDER — ONDANSETRON HCL 4 MG/2ML IJ SOLN
INTRAMUSCULAR | Status: DC | PRN
  Administered 2022-08-05: 4 mg via INTRAVENOUS

## 2022-08-05 MED ORDER — PROMETHAZINE HCL 25 MG/ML IJ SOLN: 6.2500 mg | INTRAMUSCULAR | Status: DC | PRN

## 2022-08-05 MED ORDER — INSULIN REGULAR(HUMAN) IN NACL 100-0.9 UT/100ML-% IV SOLN
INTRAVENOUS | Status: DC
  Administered 2022-08-05: 6.5 [IU]/h via INTRAVENOUS

## 2022-08-05 MED ORDER — CHLORHEXIDINE GLUCONATE 0.12 % MT SOLN
15.0000 mL | Freq: Once | OROMUCOSAL | Status: AC
  Administered 2022-08-05: 15 mL via OROMUCOSAL
  Filled 2022-08-05: qty 15

## 2022-08-05 MED ORDER — PROPOFOL 10 MG/ML IV BOLUS
INTRAVENOUS | Status: AC
  Filled 2022-08-05: qty 20

## 2022-08-05 MED ORDER — ASPIRIN 81 MG PO TBEC
81.0000 mg | DELAYED_RELEASE_TABLET | Freq: Every day | ORAL | Status: DC
  Administered 2022-08-06 – 2022-08-09 (×4): 81 mg via ORAL
  Filled 2022-08-05 (×4): qty 1

## 2022-08-05 MED ORDER — ACETAMINOPHEN 500 MG PO TABS
1000.0000 mg | ORAL_TABLET | Freq: Three times a day (TID) | ORAL | Status: DC
  Administered 2022-08-05 – 2022-08-09 (×12): 1000 mg via ORAL
  Filled 2022-08-05 (×12): qty 2

## 2022-08-05 MED ORDER — SODIUM CHLORIDE 0.9 % IV SOLN: INTRAVENOUS | Status: DC | PRN

## 2022-08-05 MED ORDER — MELATONIN 3 MG PO TABS: 3.0000 mg | ORAL_TABLET | Freq: Every evening | ORAL | Status: DC | PRN

## 2022-08-05 MED ORDER — DEXAMETHASONE SODIUM PHOSPHATE 10 MG/ML IJ SOLN
INTRAMUSCULAR | Status: AC
  Filled 2022-08-05: qty 1

## 2022-08-05 MED ORDER — CARVEDILOL 3.125 MG PO TABS
3.1250 mg | ORAL_TABLET | Freq: Two times a day (BID) | ORAL | Status: DC
  Administered 2022-08-05 – 2022-08-09 (×8): 3.125 mg via ORAL
  Filled 2022-08-05 (×8): qty 1

## 2022-08-05 MED ORDER — ACETAMINOPHEN 325 MG PO TABS: 325.0000 mg | ORAL_TABLET | ORAL | Status: DC | PRN

## 2022-08-05 MED ORDER — METHOCARBAMOL 500 MG PO TABS
500.0000 mg | ORAL_TABLET | Freq: Three times a day (TID) | ORAL | Status: DC
  Administered 2022-08-05 – 2022-08-09 (×12): 500 mg via ORAL
  Filled 2022-08-05 (×13): qty 1

## 2022-08-05 MED ORDER — METRONIDAZOLE 500 MG/100ML IV SOLN
500.0000 mg | INTRAVENOUS | Status: AC
  Administered 2022-08-05: 500 mg via INTRAVENOUS

## 2022-08-05 MED ORDER — LACTATED RINGERS IV SOLN: INTRAVENOUS | Status: DC

## 2022-08-05 MED ORDER — SODIUM CHLORIDE 0.9 % IV SOLN
INTRAVENOUS | Status: DC | PRN
  Administered 2022-08-05: 40 mL

## 2022-08-05 MED ORDER — AMISULPRIDE (ANTIEMETIC) 5 MG/2ML IV SOLN: 10.0000 mg | Freq: Once | INTRAVENOUS | Status: DC | PRN

## 2022-08-05 MED ORDER — ORAL CARE MOUTH RINSE: 15.0000 mL | Freq: Once | OROMUCOSAL | Status: AC

## 2022-08-05 MED ORDER — ROCURONIUM BROMIDE 10 MG/ML (PF) SYRINGE
PREFILLED_SYRINGE | INTRAVENOUS | Status: AC
  Filled 2022-08-05: qty 20

## 2022-08-05 MED ORDER — FENTANYL CITRATE (PF) 250 MCG/5ML IJ SOLN
INTRAMUSCULAR | Status: AC
  Filled 2022-08-05: qty 5

## 2022-08-05 MED ORDER — HYDROMORPHONE HCL 1 MG/ML IJ SOLN
0.5000 mg | INTRAMUSCULAR | Status: DC | PRN
  Administered 2022-08-05 – 2022-08-06 (×5): 0.5 mg via INTRAVENOUS
  Filled 2022-08-05 (×5): qty 0.5

## 2022-08-05 MED ORDER — PANTOPRAZOLE SODIUM 40 MG PO TBEC
40.0000 mg | DELAYED_RELEASE_TABLET | Freq: Two times a day (BID) | ORAL | Status: DC
  Administered 2022-08-05 – 2022-08-09 (×8): 40 mg via ORAL
  Filled 2022-08-05 (×8): qty 1

## 2022-08-05 MED ORDER — ENSURE PRE-SURGERY PO LIQD: 296.0000 mL | Freq: Once | ORAL | Status: DC

## 2022-08-05 MED ORDER — ROSUVASTATIN CALCIUM 20 MG PO TABS
20.0000 mg | ORAL_TABLET | Freq: Every day | ORAL | Status: DC
  Administered 2022-08-06 – 2022-08-09 (×4): 20 mg via ORAL
  Filled 2022-08-05 (×4): qty 1

## 2022-08-05 MED ORDER — PROPOFOL 10 MG/ML IV BOLUS
INTRAVENOUS | Status: DC | PRN
  Administered 2022-08-05: 130 mg via INTRAVENOUS
  Administered 2022-08-05: 30 mg via INTRAVENOUS

## 2022-08-05 MED ORDER — DOCUSATE SODIUM 100 MG PO CAPS
100.0000 mg | ORAL_CAPSULE | Freq: Two times a day (BID) | ORAL | Status: DC
  Administered 2022-08-05 – 2022-08-08 (×7): 100 mg via ORAL
  Filled 2022-08-05 (×8): qty 1

## 2022-08-05 MED ORDER — OXYCODONE HCL 5 MG/5ML PO SOLN: 5.0000 mg | Freq: Once | ORAL | Status: DC | PRN

## 2022-08-05 MED ORDER — SODIUM CHLORIDE 0.9 % IR SOLN
Status: DC | PRN
  Administered 2022-08-05: 1

## 2022-08-05 MED ORDER — ENSURE PRE-SURGERY PO LIQD: 592.0000 mL | Freq: Once | ORAL | Status: DC

## 2022-08-05 MED ORDER — ACETAMINOPHEN 10 MG/ML IV SOLN: 1000.0000 mg | Freq: Once | INTRAVENOUS | Status: DC | PRN

## 2022-08-05 MED ORDER — FENTANYL CITRATE (PF) 100 MCG/2ML IJ SOLN
25.0000 ug | INTRAMUSCULAR | Status: DC | PRN
  Administered 2022-08-05 (×3): 25 ug via INTRAVENOUS

## 2022-08-05 MED ORDER — ONDANSETRON HCL 4 MG/2ML IJ SOLN: 4.0000 mg | Freq: Four times a day (QID) | INTRAMUSCULAR | Status: DC | PRN

## 2022-08-05 MED ORDER — LIDOCAINE 2% (20 MG/ML) 5 ML SYRINGE
INTRAMUSCULAR | Status: DC | PRN
  Administered 2022-08-05: 80 mg via INTRAVENOUS

## 2022-08-05 MED ORDER — ONDANSETRON 4 MG PO TBDP
4.0000 mg | ORAL_TABLET | Freq: Four times a day (QID) | ORAL | Status: DC | PRN
  Administered 2022-08-06: 4 mg via ORAL
  Filled 2022-08-05: qty 1

## 2022-08-05 MED ORDER — OXYCODONE HCL 5 MG PO TABS: 5.0000 mg | ORAL_TABLET | Freq: Once | ORAL | Status: DC | PRN

## 2022-08-05 MED ORDER — MIDAZOLAM HCL 2 MG/2ML IJ SOLN
INTRAMUSCULAR | Status: DC | PRN
  Administered 2022-08-05: 1 mg via INTRAVENOUS

## 2022-08-05 MED ORDER — MIDAZOLAM HCL 2 MG/2ML IJ SOLN
INTRAMUSCULAR | Status: AC
  Filled 2022-08-05: qty 2

## 2022-08-05 MED ORDER — CEFAZOLIN SODIUM-DEXTROSE 2-4 GM/100ML-% IV SOLN
2.0000 g | INTRAVENOUS | Status: AC
  Administered 2022-08-05: 2 g via INTRAVENOUS

## 2022-08-05 MED ORDER — OXYCODONE HCL 5 MG PO TABS
5.0000 mg | ORAL_TABLET | ORAL | Status: DC | PRN
  Administered 2022-08-05 – 2022-08-07 (×3): 10 mg via ORAL
  Administered 2022-08-07 (×2): 5 mg via ORAL
  Administered 2022-08-08: 10 mg via ORAL
  Administered 2022-08-08 – 2022-08-09 (×2): 5 mg via ORAL
  Administered 2022-08-09: 10 mg via ORAL
  Filled 2022-08-05 (×3): qty 1
  Filled 2022-08-05 (×4): qty 2
  Filled 2022-08-05: qty 1
  Filled 2022-08-05: qty 2
  Filled 2022-08-05: qty 1
  Filled 2022-08-05: qty 2

## 2022-08-05 MED ORDER — 0.9 % SODIUM CHLORIDE (POUR BTL) OPTIME
TOPICAL | Status: DC | PRN
  Administered 2022-08-05: 2000 mL

## 2022-08-05 MED ORDER — ONDANSETRON HCL 4 MG/2ML IJ SOLN
INTRAMUSCULAR | Status: AC
  Filled 2022-08-05: qty 2

## 2022-08-05 MED ORDER — ACETAMINOPHEN 160 MG/5ML PO SOLN: 325.0000 mg | ORAL | Status: DC | PRN

## 2022-08-05 MED ORDER — BUPIVACAINE LIPOSOME 1.3 % IJ SUSP
INTRAMUSCULAR | Status: AC
  Filled 2022-08-05: qty 20

## 2022-08-05 MED ORDER — FENTANYL CITRATE (PF) 100 MCG/2ML IJ SOLN
INTRAMUSCULAR | Status: AC
  Filled 2022-08-05: qty 2

## 2022-08-05 MED ORDER — CEFAZOLIN SODIUM-DEXTROSE 2-4 GM/100ML-% IV SOLN
INTRAVENOUS | Status: AC
  Filled 2022-08-05: qty 100

## 2022-08-05 MED ORDER — ENOXAPARIN SODIUM 40 MG/0.4ML IJ SOSY
40.0000 mg | PREFILLED_SYRINGE | INTRAMUSCULAR | Status: DC
  Administered 2022-08-06 – 2022-08-09 (×4): 40 mg via SUBCUTANEOUS
  Filled 2022-08-05 (×4): qty 0.4

## 2022-08-05 MED ORDER — DIPHENHYDRAMINE HCL 50 MG/ML IJ SOLN: 12.5000 mg | Freq: Four times a day (QID) | INTRAMUSCULAR | Status: DC | PRN

## 2022-08-05 MED ORDER — ROCURONIUM BROMIDE 10 MG/ML (PF) SYRINGE
PREFILLED_SYRINGE | INTRAVENOUS | Status: DC | PRN
  Administered 2022-08-05: 50 mg via INTRAVENOUS
  Administered 2022-08-05: 20 mg via INTRAVENOUS

## 2022-08-05 MED ORDER — PHENYLEPHRINE HCL-NACL 20-0.9 MG/250ML-% IV SOLN
INTRAVENOUS | Status: DC | PRN
  Administered 2022-08-05: 20 ug/min via INTRAVENOUS

## 2022-08-05 MED ORDER — SUGAMMADEX SODIUM 200 MG/2ML IV SOLN
INTRAVENOUS | Status: DC | PRN
  Administered 2022-08-05: 200 mg via INTRAVENOUS

## 2022-08-05 MED ORDER — LIDOCAINE 2% (20 MG/ML) 5 ML SYRINGE
INTRAMUSCULAR | Status: AC
  Filled 2022-08-05: qty 5

## 2022-08-05 MED ORDER — EPHEDRINE SULFATE-NACL 50-0.9 MG/10ML-% IV SOSY
PREFILLED_SYRINGE | INTRAVENOUS | Status: DC | PRN
  Administered 2022-08-05 (×3): 2.5 mg via INTRAVENOUS
  Administered 2022-08-05: 5 mg via INTRAVENOUS

## 2022-08-05 MED ORDER — EPHEDRINE 5 MG/ML INJ
INTRAVENOUS | Status: AC
  Filled 2022-08-05: qty 5

## 2022-08-05 MED ORDER — DIPHENHYDRAMINE HCL 12.5 MG/5ML PO ELIX: 12.5000 mg | ORAL_SOLUTION | Freq: Four times a day (QID) | ORAL | Status: DC | PRN

## 2022-08-05 SURGICAL SUPPLY — 73 items
ADH SKN CLS APL DERMABOND .7 (GAUZE/BANDAGES/DRESSINGS) ×1
APL PRP STRL LF DISP 70% ISPRP (MISCELLANEOUS) ×1
APPLIER CLIP 5 13 M/L LIGAMAX5 (MISCELLANEOUS) ×1
APR CLP MED LRG 5 ANG JAW (MISCELLANEOUS) ×1
BAG COUNTER SPONGE SURGICOUNT (BAG) ×1 IMPLANT
BAG SPNG CNTER NS LX DISP (BAG) ×1
BLADE CLIPPER SURG (BLADE) IMPLANT
CANISTER SUCT 3000ML PPV (MISCELLANEOUS) ×1 IMPLANT
CELLS DAT CNTRL 66122 CELL SVR (MISCELLANEOUS) ×1 IMPLANT
CHLORAPREP W/TINT 26 (MISCELLANEOUS) ×1 IMPLANT
CLIP APPLIE 5 13 M/L LIGAMAX5 (MISCELLANEOUS) IMPLANT
COVER SURGICAL LIGHT HANDLE (MISCELLANEOUS) ×1 IMPLANT
DERMABOND ADVANCED .7 DNX12 (GAUZE/BANDAGES/DRESSINGS) ×1 IMPLANT
DRAPE INCISE IOBAN 66X45 STRL (DRAPES) ×1 IMPLANT
DRAPE WARM FLUID 44X44 (DRAPES) ×1 IMPLANT
ELECT CAUTERY BLADE 6.4 (BLADE) ×1 IMPLANT
ELECT REM PT RETURN 9FT ADLT (ELECTROSURGICAL) ×1
ELECTRODE REM PT RTRN 9FT ADLT (ELECTROSURGICAL) ×1 IMPLANT
GAUZE SPONGE 4X4 12PLY STRL (GAUZE/BANDAGES/DRESSINGS) IMPLANT
GLOVE BIOGEL PI IND STRL 6 (GLOVE) ×1 IMPLANT
GLOVE BIOGEL PI MICRO STRL 5.5 (GLOVE) ×1 IMPLANT
GOWN STRL REUS W/ TWL LRG LVL3 (GOWN DISPOSABLE) ×2 IMPLANT
GOWN STRL REUS W/TWL LRG LVL3 (GOWN DISPOSABLE) ×4
IRRIG SUCT STRYKERFLOW 2 WTIP (MISCELLANEOUS) ×1
IRRIGATION SUCT STRKRFLW 2 WTP (MISCELLANEOUS) IMPLANT
KIT BASIN OR (CUSTOM PROCEDURE TRAY) ×1 IMPLANT
KIT TURNOVER KIT B (KITS) ×1 IMPLANT
NDL INSUFFLATION 14GA 120MM (NEEDLE) IMPLANT
NEEDLE INSUFFLATION 14GA 120MM (NEEDLE) ×1 IMPLANT
NS IRRIG 1000ML POUR BTL (IV SOLUTION) ×2 IMPLANT
PAD ARMBOARD 7.5X6 YLW CONV (MISCELLANEOUS) ×2 IMPLANT
PENCIL SMOKE EVACUATOR (MISCELLANEOUS) ×1 IMPLANT
RELOAD PROXIMATE 75MM BLUE (ENDOMECHANICALS) ×2 IMPLANT
RELOAD STAPLE 75 3.8 BLU REG (ENDOMECHANICALS) IMPLANT
RETRACTOR WND ALEXIS 18 MED (MISCELLANEOUS) IMPLANT
RTRCTR WOUND ALEXIS 18CM MED (MISCELLANEOUS) ×1
SCISSORS LAP 5X35 DISP (ENDOMECHANICALS) IMPLANT
SET TUBE SMOKE EVAC HIGH FLOW (TUBING) ×1 IMPLANT
SHEARS HARMONIC ACE PLUS 36CM (ENDOMECHANICALS) IMPLANT
SLEEVE Z-THREAD 5X100MM (TROCAR) ×2 IMPLANT
SPECIMEN JAR LARGE (MISCELLANEOUS) ×1 IMPLANT
STAPLER PROXIMATE 75MM BLUE (STAPLE) IMPLANT
STAPLER VISISTAT 35W (STAPLE) IMPLANT
SUT MNCRL AB 4-0 PS2 18 (SUTURE) ×1 IMPLANT
SUT PDS AB 0 CT 36 (SUTURE) IMPLANT
SUT PDS AB 0 CTX 36 PDP370T (SUTURE) IMPLANT
SUT PDS AB 1 CT  36 (SUTURE)
SUT PDS AB 1 CT 36 (SUTURE) IMPLANT
SUT PDS AB 1 CTX 36 (SUTURE) IMPLANT
SUT SILK 2 0 SH CR/8 (SUTURE) IMPLANT
SUT SILK 2 0 TIES 10X30 (SUTURE) IMPLANT
SUT SILK 3 0 SH CR/8 (SUTURE) ×1 IMPLANT
SUT SILK 3 0 TIES 10X30 (SUTURE) ×1 IMPLANT
SUT VIC AB 3-0 MH 27 (SUTURE) IMPLANT
SUT VIC AB 3-0 SH 27 (SUTURE) ×1
SUT VIC AB 3-0 SH 27X BRD (SUTURE) ×2 IMPLANT
SUT VLOC 180 0 6IN GS21 (SUTURE) IMPLANT
SYS BAG RETRIEVAL 10MM (BASKET) ×1
SYS LAPSCP GELPORT 120MM (MISCELLANEOUS) ×1
SYSTEM BAG RETRIEVAL 10MM (BASKET) IMPLANT
SYSTEM LAPSCP GELPORT 120MM (MISCELLANEOUS) ×1 IMPLANT
TOWEL GREEN STERILE (TOWEL DISPOSABLE) ×1 IMPLANT
TOWEL GREEN STERILE FF (TOWEL DISPOSABLE) ×1 IMPLANT
TRAY FOLEY MTR SLVR 14FR STAT (SET/KITS/TRAYS/PACK) IMPLANT
TRAY LAPAROSCOPIC MC (CUSTOM PROCEDURE TRAY) ×1 IMPLANT
TROCAR 11X100 Z THREAD (TROCAR) IMPLANT
TROCAR BALLN 12MMX100 BLUNT (TROCAR) IMPLANT
TROCAR Z THREAD OPTICAL 12X100 (TROCAR) IMPLANT
TROCAR Z-THREAD OPTICAL 5X100M (TROCAR) ×1 IMPLANT
TUBE CONNECTING 12X1/4 (SUCTIONS) ×1 IMPLANT
WARMER LAPAROSCOPE (MISCELLANEOUS) ×1 IMPLANT
WATER STERILE IRR 1000ML POUR (IV SOLUTION) ×1 IMPLANT
YANKAUER SUCT BULB TIP NO VENT (SUCTIONS) ×1 IMPLANT

## 2022-08-05 NOTE — Anesthesia Procedure Notes (Signed)
Arterial Line Insertion Start/End5/16/2024 10:35 AM, 08/05/2022 10:40 AM Performed by: Waynard Edwards, CRNA, CRNA  Patient location: Pre-op. Preanesthetic checklist: patient identified, IV checked, site marked, risks and benefits discussed, surgical consent, monitors and equipment checked, pre-op evaluation, timeout performed and anesthesia consent Lidocaine 1% used for infiltration Left, radial was placed Catheter size: 20 G Hand hygiene performed , maximum sterile barriers used  and Seldinger technique used Allen's test indicative of satisfactory collateral circulation Attempts: 1 Procedure performed without using ultrasound guided technique. Following insertion, dressing applied and Biopatch. Post procedure assessment: normal and unchanged  Patient tolerated the procedure well with no immediate complications.

## 2022-08-05 NOTE — Transfer of Care (Signed)
Immediate Anesthesia Transfer of Care Note  Patient: Leslie Davis  Procedure(s) Performed: LAPAROSCOPIC ASSISTED, POSSIBLE OPEN, EXCISION OF MESENTERIC MASS (Abdomen) LAPAROSCOPIC SMALL BOWEL RESECTION (Abdomen)  Patient Location: PACU  Anesthesia Type:General  Level of Consciousness: awake and alert   Airway & Oxygen Therapy: Patient Spontanous Breathing  Post-op Assessment: Report given to RN and Post -op Vital signs reviewed and stable  Post vital signs: Reviewed and stable  Last Vitals:  Vitals Value Taken Time  BP 144/59 08/05/22 1340  Temp    Pulse 59 08/05/22 1343  Resp 15 08/05/22 1343  SpO2 95 % 08/05/22 1343  Vitals shown include unvalidated device data.  Last Pain:  Vitals:   08/05/22 0922  TempSrc:   PainSc: 0-No pain         Complications: No notable events documented.

## 2022-08-05 NOTE — H&P (Signed)
Leslie Davis is an 67 y.o. female.   Chief Complaint: intraabdominal mass HPI: Leslie Davis is a 67 y.o. female who was referred to me for evaluation of a mesenteric mass. She has a history of GERD and peptic ulcers, for which she is followed by GI (Dr. Meridee Score) and underwent an EGD on 04/07/22. Biopsies of her gastric ulcers at that time were benign and negative for H pylori. She was treated with a PPI and carafate but has continued to have abdominal pain, so a CT scan was completed for further workup. This showed a 3cm mass in the small bowel mesentery, suspicious for a GIST vs carcinoid vs desmoid tumor. She was referred to discuss surgical resection.   She has a history of CAD and had a LHC in May 2023, which showed mid-LAD stenosis. At that time she was not having anginal symptoms and opted for medical therapy. She is on aspirin 81mg  daily. She denies any chest pain or shortness of breath with activity. She saw her cardiologist for preop risk stratification on 4/15.  Previous abdominal surgeries include a cholecystectomy. She reports one of her aunts had gastric cancer, but she has no other known family history of cancer.   Past Medical History:  Diagnosis Date   CKD (chronic kidney disease)    stage 3   Coronary artery disease 07/2021   obstructive CAD-70% LAD-treated medically   DM (diabetes mellitus) (HCC)    Fatty liver disease, nonalcoholic    Gastritis    Gastroparesis    GERD (gastroesophageal reflux disease)    Heartburn    Hiatal hernia    HLD (hyperlipidemia)    HTN (hypertension)    Joint pain    Renal calculus    Rheumatoid arthritis (HCC)    Vitamin D deficiency     Past Surgical History:  Procedure Laterality Date   BREAST BIOPSY  02/04/2022   MM LT RADIOACTIVE SEED EA ADD LESION LOC MAMMO GUIDE 02/04/2022 [facility]   BREAST BIOPSY  02/04/2022   MM LT RADIOACTIVE SEED LOC MAMMO GUIDE 02/04/2022 [facility]MO[facility]   BREAST LUMPECTOMY WITH  RADIOACTIVE SEED LOCALIZATION Left 02/05/2022   Procedure: LEFT BREAST BRACKETED LUMPECTOMY WITH RADIOACTIVE SEED LOCALIZATION;  Surgeon: Griselda Miner, MD;  Location: [facility];  Service: General;  Laterality: Left;   CHOLECYSTECTOMY     COLONOSCOPY  09/10/2011   normal   JOINT REPLACEMENT  2022   left TKA   LEFT HEART CATH AND CORONARY ANGIOGRAPHY N/A 08/18/2021   Procedure: LEFT HEART CATH AND CORONARY ANGIOGRAPHY;  Surgeon: Elder Negus, MD;  Location: [facility];  Service: Cardiovascular;  Laterality: N/A;   TOTAL KNEE ARTHROPLASTY Left 10/27/2020   Procedure: LEFT TOTAL KNEE ARTHROPLASTY;  Surgeon: Tarry Kos, MD;  Location: [facility] OR;  Service: Orthopedics;  Laterality: Left;   TUBAL LIGATION     UPPER GASTROINTESTINAL ENDOSCOPY      Family History  Problem Relation Age of Onset   Diabetes Mother    Thyroid disease Mother    Kidney disease Mother    Hypertension Father    Hyperlipidemia Father    Sudden death Father    Stroke Father    Diabetes Sister    Heart disease Sister    Kidney disease Sister    COPD Sister    Heart disease Brother 89   Diabetes Brother    Stomach cancer Maternal Aunt        X 2 aunts  Diabetes Maternal Grandmother    Colon cancer Neg Hx    Esophageal cancer Neg Hx    Rectal cancer Neg Hx    Colon polyps Neg Hx    Social History:  reports that she quit smoking about 47 years ago. Her smoking use included cigarettes. She has a 0.25 pack-year smoking history. She has never used smokeless tobacco. She reports that she does not drink alcohol and does not use drugs.  Allergies:  Allergies  Allergen Reactions   Fiasp [Insulin Aspart (W-Niacinamide)] Hives    Medications Prior to Admission  Medication Sig Dispense Refill   amLODipine (NORVASC) 10 MG tablet TAKE ONE TABLET BY MOUTH ONCE DAILY 90 tablet 3   aspirin EC 81 MG tablet Take 1 tablet (81 mg total) by mouth daily. Swallow whole. 150 tablet 2    carvedilol (COREG) 3.125 MG tablet TAKE ONE TABLET BY MOUTH TWICE DAILY WITH A MEAL 180 tablet 1   celecoxib (CELEBREX) 200 MG capsule Take 1 capsule (200 mg total) by mouth 2 (two) times daily as needed. 180 capsule 2   cetirizine (ZYRTEC ALLERGY) 10 MG tablet Take 1 tablet (10 mg total) by mouth daily. 30 tablet 2   clindamycin (CLEOCIN T) 1 % lotion Apply 1 Application topically daily as needed (acne).     EDARBI 80 MG TABS TAKE ONE TABLET BY MOUTH ONCE DAILY 90 tablet 2   FARXIGA 10 MG TABS tablet TAKE ONE TABLET BY MOUTH ONCE DAILY BEFORE BREAKFAST 90 tablet 2   ferrous gluconate (FERGON) 324 MG tablet TAKE 1 TABLET BY MOUTH TWICE DAILY WITH A MEAL. 100 tablet 0   nitroGLYCERIN (NITROSTAT) 0.4 MG SL tablet Place 1 tablet (0.4 mg total) under the tongue every 5 (five) minutes as needed for chest pain. If you require more than two tablets five minutes apart go to the nearest ER via EMS. 30 tablet 0   pantoprazole (PROTONIX) 40 MG tablet TAKE ONE TABLET BY MOUTH TWICE DAILY 60 tablet 3   rosuvastatin (CRESTOR) 20 MG tablet TAKE ONE TABLET BY MOUTH ONCE DAILY 90 tablet 2   temazepam (RESTORIL) 30 MG capsule TAKE ONE CAPSULE BY MOUTH AT bedtime AS NEEDED 30 capsule 3   benzonatate (TESSALON PERLES) 100 MG capsule Take 1 capsule (100 mg total) by mouth 3 (three) times daily as needed for cough. (Patient not taking: Reported on 07/05/2022) 30 capsule 1   Blood Glucose Monitoring Suppl (ONETOUCH VERIO) w/Device KIT Use as directed to check blood sugars 2 times per day dx: e11.65 1 kit 1   ezetimibe (ZETIA) 10 MG tablet Take 1 tablet (10 mg total) by mouth daily. (Patient not taking: Reported on 07/27/2022) 90 tablet 3   glucose blood (ONETOUCH VERIO) test strip USE TO check blood glucose TWICE DAILY AS DIRECTED 150 strip 11   linaclotide ([name]) 145 MCG CAPS capsule Take 1 capsule (145 mcg total) by mouth daily. 30 capsule 0   ondansetron (ZOFRAN) 4 MG tablet Take 1 tablet (4 mg total) by mouth every 8  (eight) hours as needed for nausea or vomiting. 20 tablet 0   OneTouch Delica Lancets 33G MISC Use as directed to check blood sugars 2 times per day dx:e11.65 150 each 3   OZEMPIC, 2 MG/DOSE, 8 MG/3ML SOPN Inject 2mg  into THE SKIN ONCE WEEKLY (Patient not taking: Reported on 07/05/2022) 9 mL 0   sucralfate (CARAFATE) 1 g tablet Take 1 tablet (1 g total) by mouth 4 (four) times daily -  with  meals and at bedtime. (Patient not taking: Reported on 07/05/2022) 60 tablet 4   TRUEPLUS PEN NEEDLES 31G X 6 MM MISC USE WITH pen TO INJECT insulin DAILY 150 each 3    Results for orders placed or performed during the hospital encounter of 08/05/22 (from the past 48 hour(s))  Glucose, capillary     Status: Abnormal   Collection Time: 08/05/22  8:50 AM  Result Value Ref Range   Glucose-Capillary 258 (H) 70 - 99 mg/dL    Comment: Glucose reference range applies only to samples taken after fasting for at least 8 hours.  Glucose, capillary     Status: Abnormal   Collection Time: 08/05/22 10:27 AM  Result Value Ref Range   Glucose-Capillary 216 (H) 70 - 99 mg/dL    Comment: Glucose reference range applies only to samples taken after fasting for at least 8 hours.   No results found.  Review of Systems  Blood pressure (!) 191/68, pulse (!) 50, temperature 97.7 F (36.5 C), temperature source Oral, resp. rate 18, height 5\' 2"  (1.575 m), weight 85.7 kg, SpO2 97 %. Physical Exam Constitutional:      General: She is not in acute distress.    Appearance: Normal appearance.  HENT:     Head: Normocephalic and atraumatic.  Eyes:     General: No scleral icterus.    Conjunctiva/sclera: Conjunctivae normal.  Pulmonary:     Effort: Pulmonary effort is normal. No respiratory distress.  Abdominal:     General: There is no distension.     Palpations: Abdomen is soft.     Tenderness: There is no abdominal tenderness.  Musculoskeletal:        General: Normal range of motion.  Skin:    General: Skin is warm and  dry.  Neurological:     General: No focal deficit present.     Mental Status: She is alert and oriented to person, place, and time.      Assessment/Plan 67 yo female with a mesenteric mass, not amenable to percutaneous or endoscopic biopsy. Proceed to OR for diagnostic laparoscopy and mass excision, with possible small bowel resection and possible laparotomy. I reviewed the procedure details with the patient and she agreed to proceed. Informed consent was obtained. She will be admitted to inpatient postoperatively.  Fritzi Mandes, MD 08/05/2022, 10:44 AM

## 2022-08-05 NOTE — Plan of Care (Signed)
  Problem: Education: Goal: Knowledge of General Education information will improve Description Including pain rating scale, medication(s)/side effects and non-pharmacologic comfort measures Outcome: Progressing   Problem: Health Behavior/Discharge Planning: Goal: Ability to manage health-related needs will improve Outcome: Progressing   

## 2022-08-05 NOTE — Op Note (Signed)
Date: 08/05/22  Patient: Leslie Davis MRN: [id number]  Preoperative Diagnosis: Mesenteric mass Postoperative Diagnosis: Mesenteric mass, small bowel mass  Procedure:  Laparoscopic excision of mesenteric mass Laparoscopic-assisted small bowel resection (5cm of bowel resected)  Surgeon: Sophronia Simas, MD Assistant: Axel Filler, MD  EBL: Minimal  Anesthesia: General endotracheal  Specimens:  Mesenteric mass Small bowel resection  Indications: Leslie Davis is a 67 yo female who presented with abdominal pain and reflux symptoms, and on imaging workup was incidentally found to have a mass in the small bowel mesentery. This was not amenable to percutaneous or endoscopic biopsy. After a discussion of the risks and benefits of surgery, she agreed to proceed with resection both for treatment and to obtain a definitive diagnosis.  Findings: Mass in the mesentery of the mid small bowel, approximately 4cm in diameter, well-encapsulated. This was enucleated. A small mass on the adjacent small bowel was also noted, with puckering of the bowel. A small bowel resection was performed.  Procedure details: Informed consent was obtained in the preoperative area prior to the procedure. The patient was brought to the operating room and placed on the table in the supine position. General anesthesia was induced and appropriate lines and drains were placed for intraoperative monitoring. Perioperative antibiotics were administered per SCIP guidelines. The abdomen was prepped and draped in the usual sterile fashion. A pre-procedure timeout was taken verifying patient identity, surgical site and procedure to be performed.  A small skin incision was made in the left upper quadrant at Palmer's point, the subcutaneous tissue was bluntly spread, and the fascia was grasped and elevated.  A Veress needle was inserted through the fascia, intraperitoneal placement was confirmed with the saline drop test and the  abdomen was insufflated.  A 5 mm Visiport was placed, and the abdomen was inspected with no evidence of visceral or vascular injury.  No peritoneal or liver nodules were noted.  There were some omental adhesions to the abdominal wall at the umbilicus, which were taken down bluntly.  Additional 5 mm ports were placed in the left mid abdomen and in the left lower quadrant.  The omentum was swept cephalad and the small bowel mesentery was examined.  There was a well-circumscribed mass within the central mesentery of the mid small bowel.  A 12 mm port was placed in the right lower quadrant to aid in retraction.  The peritoneum overlying the mass was incised with harmonic shears, and the mass was then enucleated from the mesentery using the harmonic.  The mesenteric vessels were carefully preserved, and the mass was well-circumscribed with no invasion into surrounding structures.  Small branch vessels were clipped to maintain hemostasis.  Once the mass was completely excised, it was placed in an Endo Catch bag.  The resulting mesenteric defect was closed with a running 2-0 V-loc suture.  The small bowel was examined and appeared pink and well-perfused, however at this point a firm nodule was noted on the adjacent small bowel.  This was approximately 2 cm in length with puckering of the bowel.  The decision was made to resect this segment of bowel.  The Endo Catch bag containing the mesenteric mass was removed via the right lower quadrant port.  The skin incision was slightly extended and the fascia and muscle were further opened.  There was bleeding from the inferior epigastric artery, which was ligated with a 3-0 silk tie.  A small wound protector was placed and the loop of small bowel with the mass  was exteriorized.  Mesenteric windows were created proximal and distal to the mass, and the bowel was transected at each point with a 75 mm GIA stapler with a blue load.  The intervening mesentery was divided with harmonic  shears, taking a small triangle of mesentery with the specimen.  A total length of approximately 5 cm was resected.  The specimen was passed off the field and sent for routine pathology.  Enterotomies were then made on the antimesenteric border of each end of the small bowel, and an antiperistaltic side-to-side anastomosis was created with a 75 mm GIA stapler with a blue load.  The common enterotomy was closed with an inner layer of running 3-0 Vicryl suture and an outer layer of 3-0 silk Lembert sutures.  A 3-0 silk stay suture was placed in the apex of the anastomosis to minimize tension.  The mesenteric defect was closed with interrupted 3-0 silk figure-of-eight sutures.  The bowel was placed back into the abdomen and the wound protector was clamped.  The abdomen was insufflated.  The entire small bowel was run and no additional nodules or masses were noted.  The surgical site appeared hemostatic.  The ports and wound protector were removed and the abdomen was desufflated.  The fascia at the right lower quadrant incision was closed in a single layer with a running 1 PDS suture.  Scarpa's layer was closed with running 3-0 Vicryl suture.  The skin at all port sites was closed with subcuticular 4-0 Monocryl suture.  Dermabond was applied.  The patient tolerated the procedure well with no apparent complications.  All counts were correct x2 at the end of the procedure. The patient was extubated and taken to PACU in stable condition.  Sophronia Simas, MD 08/05/22 1:45 PM

## 2022-08-05 NOTE — Anesthesia Postprocedure Evaluation (Signed)
Anesthesia Post Note  Patient: Leslie Davis  Procedure(s) Performed: LAPAROSCOPIC ASSISTED EXCISION OF MESENTERIC MASS (Abdomen) LAPAROSCOPIC SMALL BOWEL RESECTION (Abdomen)     Patient location during evaluation: PACU Anesthesia Type: General Level of consciousness: awake and alert Pain management: pain level controlled Vital Signs Assessment: post-procedure vital signs reviewed and stable Respiratory status: spontaneous breathing, nonlabored ventilation, respiratory function stable and patient connected to nasal cannula oxygen Cardiovascular status: blood pressure returned to baseline and stable Postop Assessment: no apparent nausea or vomiting Anesthetic complications: no  No notable events documented.  Last Vitals:  Vitals:   08/05/22 1410 08/05/22 1425  BP: (!) 155/66   Pulse: (!) 51 (!) 50  Resp: 13 14  Temp:    SpO2: 98% 93%    Last Pain:  Vitals:   08/05/22 1410  TempSrc:   PainSc: 7                  Anay Walter,W. EDMOND

## 2022-08-05 NOTE — Anesthesia Procedure Notes (Signed)
Procedure Name: Intubation Date/Time: 08/05/2022 11:35 AM  Performed by: Maxine Glenn, CRNAPre-anesthesia Checklist: Patient identified, Emergency Drugs available, Suction available and Patient being monitored Patient Re-evaluated:Patient Re-evaluated prior to induction Oxygen Delivery Method: Circle system utilized Preoxygenation: Pre-oxygenation with 100% oxygen Induction Type: IV induction Ventilation: Mask ventilation without difficulty Laryngoscope Size: Mac and 3 Grade View: Grade I Tube type: Oral Tube size: 7.0 mm Number of attempts: 1 Airway Equipment and Method: Stylet and Oral airway Placement Confirmation: ETT inserted through vocal cords under direct vision, positive ETCO2 and breath sounds checked- equal and bilateral Secured at: 21 cm Tube secured with: Tape Dental Injury: Teeth and Oropharynx as per pre-operative assessment

## 2022-08-06 ENCOUNTER — Encounter (HOSPITAL_COMMUNITY): Payer: Self-pay | Admitting: Surgery

## 2022-08-06 LAB — CBC
HCT: 33.4 % — ABNORMAL LOW (ref 36.0–46.0)
Hemoglobin: 10.7 g/dL — ABNORMAL LOW (ref 12.0–15.0)
MCH: 28.2 pg (ref 26.0–34.0)
MCHC: 32 g/dL (ref 30.0–36.0)
MCV: 88.1 fL (ref 80.0–100.0)
Platelets: 164 10*3/uL (ref 150–400)
RBC: 3.79 MIL/uL — ABNORMAL LOW (ref 3.87–5.11)
RDW: 15.1 % (ref 11.5–15.5)
WBC: 6.2 10*3/uL (ref 4.0–10.5)
nRBC: 0 % (ref 0.0–0.2)

## 2022-08-06 LAB — GLUCOSE, CAPILLARY
Glucose-Capillary: 179 mg/dL — ABNORMAL HIGH (ref 70–99)
Glucose-Capillary: 212 mg/dL — ABNORMAL HIGH (ref 70–99)
Glucose-Capillary: 349 mg/dL — ABNORMAL HIGH (ref 70–99)
Glucose-Capillary: 312 mg/dL — ABNORMAL HIGH (ref 70–99)
Glucose-Capillary: 248 mg/dL — ABNORMAL HIGH (ref 70–99)

## 2022-08-06 LAB — BASIC METABOLIC PANEL
Anion gap: 8 (ref 5–15)
BUN: 16 mg/dL (ref 8–23)
CO2: 23 mmol/L (ref 22–32)
Calcium: 8.5 mg/dL — ABNORMAL LOW (ref 8.9–10.3)
Chloride: 103 mmol/L (ref 98–111)
Creatinine, Ser: 1.06 mg/dL — ABNORMAL HIGH (ref 0.44–1.00)
GFR, Estimated: 57 mL/min — ABNORMAL LOW (ref >60)
Glucose, Bld: 201 mg/dL — ABNORMAL HIGH (ref 70–99)
Potassium: 4.1 mmol/L (ref 3.5–5.1)
Sodium: 134 mmol/L — ABNORMAL LOW (ref 135–145)

## 2022-08-06 MED ORDER — DAPAGLIFLOZIN PROPANEDIOL 10 MG PO TABS
10.0000 mg | ORAL_TABLET | Freq: Every day | ORAL | Status: DC
  Administered 2022-08-07 – 2022-08-09 (×3): 10 mg via ORAL
  Filled 2022-08-06 (×3): qty 1

## 2022-08-06 MED ORDER — INSULIN GLARGINE-YFGN 100 UNIT/ML ~~LOC~~ SOLN
5.0000 [IU] | Freq: Every day | SUBCUTANEOUS | Status: DC
  Administered 2022-08-06: 5 [IU] via SUBCUTANEOUS
  Filled 2022-08-06 (×2): qty 0.05

## 2022-08-06 MED ORDER — AMLODIPINE BESYLATE 10 MG PO TABS
10.0000 mg | ORAL_TABLET | Freq: Every day | ORAL | Status: DC
  Administered 2022-08-06 – 2022-08-09 (×4): 10 mg via ORAL
  Filled 2022-08-06 (×4): qty 1

## 2022-08-06 NOTE — Progress Notes (Signed)
    1 Day Post-Op  Subjective: No acute issues overnight. Hyperglycemic >300. Pain controlled, denies nausea/vomiting, tolerating clear liquids.   Objective: Vital signs in last 24 hours: Temp:  [97.3 F (36.3 C)-98.3 F (36.8 C)] 98 F (36.7 C) (05/17 0440) Pulse Rate:  [50-59] 56 (05/17 0440) Resp:  [13-20] 17 (05/17 0440) BP: (141-191)/(57-78) 168/62 (05/17 0440) SpO2:  [93 %-99 %] 98 % (05/17 0440) Arterial Line BP: (172-208)/(56-61) 208/61 (05/16 1445) Weight:  [85.7 kg] 85.7 kg (05/16 0845) Last BM Date : 08/04/22  Intake/Output from previous day: 05/16 0701 - 05/17 0700 In: 2428.2 [P.O.:120; I.V.:2208.2; IV Piggyback:100] Out: 700 [Urine:650; Blood:50] Intake/Output this shift: No intake/output data recorded.  PE: General: resting comfortably, NAD Neuro: alert and oriented, no focal deficits Resp: normal work of breathing on room air Abdomen: soft, nondistended, nontender to palpation. Incisions clean and dry with no erythema or induration. Extremities: warm and well-perfused   Lab Results:  No results for input(s): "WBC", "HGB", "HCT", "PLT" in the last 72 hours. BMET No results for input(s): "NA", "K", "CL", "CO2", "GLUCOSE", "BUN", "CREATININE", "CALCIUM" in the last 72 hours. PT/INR No results for input(s): "LABPROT", "INR" in the last 72 hours. CMP     Component Value Date/Time   NA 137 07/28/2022 1000   NA 140 07/05/2022 0946   K 4.6 07/28/2022 1000   CL 104 07/28/2022 1000   CO2 25 07/28/2022 1000   GLUCOSE 208 (H) 07/28/2022 1000   BUN 21 07/28/2022 1000   BUN 20 07/05/2022 0946   CREATININE 1.25 (H) 07/28/2022 1000   CREATININE 0.74 08/30/2011 1307   CALCIUM 9.4 07/28/2022 1000   PROT 7.0 07/28/2022 1000   PROT 7.0 07/05/2022 0946   ALBUMIN 3.5 07/28/2022 1000   ALBUMIN 4.2 07/05/2022 0946   AST 27 07/28/2022 1000   ALT 39 07/28/2022 1000   ALKPHOS 114 07/28/2022 1000   BILITOT 0.4 07/28/2022 1000   BILITOT 0.3 07/05/2022 0946    GFRNONAA 47 (L) 07/28/2022 1000   GFRAA 57 (L) 02/21/2020 1500   Lipase     Component Value Date/Time   LIPASE 54 03/23/2022 1032       Studies/Results: No results found.      Assessment/Plan 67 y o female with mesenteric mass and small bowel mass, POD1 s/p laparoscopic-assisted enucleation of mesenteric mass and small bowel resection. - Advance to carb control diet, SLIV when taking PO - Multimodal pain control - HTN: Home coreg and norvasc - DM: Patient has allergy to insulin aspart but says she has been on Lantus in the past. Will start 5u daily of Semglee, plan to resume home Farxiga once tolerating PO regularly. - Ambulate, PT ordered - VTE: lovenox, SCDs - Dispo: inpatient, med-surg floor    LOS: 1 day    Sophronia Simas, MD [facility] Surgery General, Hepatobiliary and Pancreatic Surgery 08/06/22 7:46 AM

## 2022-08-06 NOTE — Care Management Important Message (Signed)
Important Message  Patient Details  Name: Leslie Davis MRN: [id number] Date of Birth: 1955-09-22   Medicare Important Message Given:        Sherilyn Banker 08/06/2022, 3:59 PM

## 2022-08-06 NOTE — TOC CM/SW Note (Signed)
  Transition of Care (TOC) Screening Note   Patient Details  Name: Leslie Davis Date of Birth: [birth year]   08/05/22 s/p laparoscopic-assisted enucleation of mesenteric mass and small bowel resection   Await PT evaluation and recommendations     Transition of Care Department [facility]) has reviewed patient and  will continue to monitor patient advancement through interdisciplinary progression rounds. If new patient transition needs arise, please place a TOC consult.

## 2022-08-06 NOTE — Evaluation (Signed)
Physical Therapy Evaluation and Discharge  Patient Details Name: Leslie Davis MRN: [id number] DOB: 1955-07-16 Today's Date: 08/06/2022  History of Present Illness  Patient is a 67 year old female with mesenteric mass and small bowel mass. S/p aparoscopic-assisted enucleation of mesenteric mass and small bowel resection.  Clinical Impression  PT evaluation completed. Patient reports she is independent at baseline and lives at home with her spouse. She has a rolling walker if needed at home.  The patient was sitting up on arrival to room. Supervision for transfers and hallway ambulation using rolling walker. Encouraged routine, short distance ambulation for conditioning and using rolling walker as needed for safety if having abdominal pain with walking. No acute PT needs at this time. Recommend routine mobility with staff supervision.       Recommendations for follow up therapy are one component of a multi-disciplinary discharge planning process, led by the attending physician.  Recommendations may be updated based on patient status, additional functional criteria and insurance authorization.  Follow Up Recommendations       Assistance Recommended at Discharge PRN  Patient can return home with the following  Assist for transportation    Equipment Recommendations None recommended by PT (recommend PRN use of rolling walker (patient already has one at home))  Recommendations for Other Services       Functional Status Assessment Patient has not had a recent decline in their functional status     Precautions / Restrictions Precautions Precautions: Fall Restrictions Weight Bearing Restrictions: No      Mobility  Bed Mobility               General bed mobility comments: not assessed as patient sitting up on arrival and post session    Transfers Overall transfer level: Needs assistance Equipment used: Rolling walker (2 wheels) Transfers: Sit to/from Stand Sit to Stand:  Supervision           General transfer comment: cues for hand placement for safety with carry over demonstrated    Ambulation/Gait Ambulation/Gait assistance: Supervision Gait Distance (Feet): 100 Feet Assistive device: Rolling walker (2 wheels) Gait Pattern/deviations: Step-through pattern Gait velocity: decreased     General Gait Details: slow but steady with rolling walker. encouraged routine, short distance ambulation for conditioning and using rolling walker for safety and fall prevention if/when having abdominal pain with mobility.  Stairs            Wheelchair Mobility    Modified Rankin (Stroke Patients Only)       Balance Overall balance assessment: Needs assistance Sitting-balance support: Feet supported Sitting balance-Leahy Scale: Fair     Standing balance support: Bilateral upper extremity supported Standing balance-Leahy Scale: Fair                               Pertinent Vitals/Pain Pain Assessment Pain Assessment: Faces Faces Pain Scale: Hurts a little bit Pain Location: abdomen Pain Descriptors / Indicators: Discomfort Pain Intervention(s): Limited activity within patient's tolerance, Monitored during session, Repositioned    Home Living Family/patient expects to be discharged to:: Private residence Living Arrangements: Spouse/significant other Available Help at Discharge: Family Type of Home: House Home Access: Stairs to enter       Home Layout: One level Home Equipment: Agricultural consultant (2 wheels)      Prior Function Prior Level of Function : Independent/Modified Independent  Hand Dominance        Extremity/Trunk Assessment   Upper Extremity Assessment Upper Extremity Assessment: Overall WFL for tasks assessed    Lower Extremity Assessment Lower Extremity Assessment: Overall WFL for tasks assessed       Communication   Communication: No difficulties  Cognition  Arousal/Alertness: Awake/alert Behavior During Therapy: WFL for tasks assessed/performed Overall Cognitive Status: Within Functional Limits for tasks assessed                                          General Comments      Exercises     Assessment/Plan    PT Assessment Patient does not need any further PT services  PT Problem List         PT Treatment Interventions      PT Goals (Current goals can be found in the Care Plan section)  Acute Rehab PT Goals PT Goal Formulation: All assessment and education complete, DC therapy    Frequency       Co-evaluation               AM-PAC PT "6 Clicks" Mobility  Outcome Measure Help needed turning from your back to your side while in a flat bed without using bedrails?: None Help needed moving from lying on your back to sitting on the side of a flat bed without using bedrails?: None Help needed moving to and from a bed to a chair (including a wheelchair)?: None Help needed standing up from a chair using your arms (e.g., wheelchair or bedside chair)?: None Help needed to walk in hospital room?: None Help needed climbing 3-5 steps with a railing? : None 6 Click Score: 24    End of Session   Activity Tolerance: Patient tolerated treatment well Patient left: in chair;with call bell/phone within reach        Time: 1122-1138 PT Time Calculation (min) (ACUTE ONLY): 16 min   Charges:   PT Evaluation $PT Eval Low Complexity: 1 Low        Donna Bernard, PT, MPT   Ina Homes 08/06/2022, 1:01 PM

## 2022-08-06 NOTE — Inpatient Diabetes Management (Signed)
Inpatient Diabetes Program Recommendations  AACE/ADA: New Consensus Statement on Inpatient Glycemic Control (2015)  Target Ranges:  Prepandial:   less than 140 mg/dL      Peak postprandial:   less than 180 mg/dL (1-2 hours)      Critically ill patients:  140 - 180 mg/dL   Lab Results  Component Value Date   GLUCAP 212 (H) 08/06/2022   HGBA1C 6.8 (H) 07/05/2022    Latest Reference Range & Units 08/05/22 20:35 08/05/22 23:56 08/06/22 04:33 08/06/22 08:08  Glucose-Capillary 70 - 99 mg/dL 161 (H) 096 (H) 045 (H) 212 (H)  (H): Data is abnormally high Review of Glycemic Control  Diabetes history: type 2 Outpatient Diabetes medications: Farxiga 10 mg daily, Ozempic (not taking) Current orders for Inpatient glycemic control: Semglee 5 units daily  Inpatient Diabetes Program Recommendations:   Noted that blood sugars have been greater than 180 mg/dl. Recommend adding Novolog 0-9 units correction scale TID and continue checking blood sugars TID & HS.   Smith Mince RN BSN CDE Diabetes Coordinator Pager: [phone]  8am-5pm

## 2022-08-07 LAB — GLUCOSE, CAPILLARY
Glucose-Capillary: 315 mg/dL — ABNORMAL HIGH (ref 70–99)
Glucose-Capillary: 383 mg/dL — ABNORMAL HIGH (ref 70–99)
Glucose-Capillary: 318 mg/dL — ABNORMAL HIGH (ref 70–99)
Glucose-Capillary: 324 mg/dL — ABNORMAL HIGH (ref 70–99)

## 2022-08-07 MED ORDER — POLYETHYLENE GLYCOL 3350 17 G PO PACK
17.0000 g | PACK | Freq: Every day | ORAL | Status: DC
  Administered 2022-08-07 – 2022-08-08 (×2): 17 g via ORAL
  Filled 2022-08-07 (×3): qty 1

## 2022-08-07 NOTE — Progress Notes (Signed)
    2 Days Post-Op  Subjective: Passing flatus, no BM. Tolerating regular diet. Hyperglycemic to 349 yesterday afternoon. Has some pain this morning.   Objective: Vital signs in last 24 hours: Temp:  [97.9 F (36.6 C)-98.5 F (36.9 C)] 97.9 F (36.6 C) (05/18 0449) Pulse Rate:  [58-67] 58 (05/18 0449) Resp:  [16-18] 18 (05/18 0449) BP: (156-188)/(51-63) 156/63 (05/18 0449) SpO2:  [92 %-98 %] 92 % (05/18 0449) Last BM Date : 08/04/22  Intake/Output from previous day: 05/17 0701 - 05/18 0700 In: 1999.8 [P.O.:900; I.V.:1099.8] Out: -  Intake/Output this shift: Total I/O In: 1099.8 [I.V.:1099.8] Out: -   PE: General: up in chair, NAD Neuro: alert and oriented, no focal deficits Resp: normal work of breathing on room air Abdomen: soft, nondistended, appropriately tender to palpation. Incisions clean and dry with no erythema or induration. Extremities: warm and well-perfused   Lab Results:  Recent Labs    08/06/22 0809  WBC 6.2  HGB 10.7*  HCT 33.4*  PLT 164   BMET Recent Labs    08/06/22 0809  NA 134*  K 4.1  CL 103  CO2 23  GLUCOSE 201*  BUN 16  CREATININE 1.06*  CALCIUM 8.5*   PT/INR No results for input(s): "LABPROT", "INR" in the last 72 hours. CMP     Component Value Date/Time   NA 134 (L) 08/06/2022 0809   NA 140 07/05/2022 0946   K 4.1 08/06/2022 0809   CL 103 08/06/2022 0809   CO2 23 08/06/2022 0809   GLUCOSE 201 (H) 08/06/2022 0809   BUN 16 08/06/2022 0809   BUN 20 07/05/2022 0946   CREATININE 1.06 (H) 08/06/2022 0809   CREATININE 0.74 08/30/2011 1307   CALCIUM 8.5 (L) 08/06/2022 0809   PROT 7.0 07/28/2022 1000   PROT 7.0 07/05/2022 0946   ALBUMIN 3.5 07/28/2022 1000   ALBUMIN 4.2 07/05/2022 0946   AST 27 07/28/2022 1000   ALT 39 07/28/2022 1000   ALKPHOS 114 07/28/2022 1000   BILITOT 0.4 07/28/2022 1000   BILITOT 0.3 07/05/2022 0946   GFRNONAA 57 (L) 08/06/2022 0809   GFRAA 57 (L) 02/21/2020 1500   Lipase     Component Value  Date/Time   LIPASE 54 03/23/2022 1032       Studies/Results: No results found.      Assessment/Plan 67 y o female with mesenteric mass and small bowel mass, POD2 s/p laparoscopic-assisted enucleation of mesenteric mass and small bowel resection. - Carb control diet, SLIV - Multimodal pain control - HTN: Home coreg and norvasc - DM: Resume home Farxiga this morning. Stop Semglee. Patient reports an allergic reaction to Novolog. Will use Humalog if she has further hyperglycemia today. - CAD: on home aspirin 81mg  daily - Ambulate TID - VTE: lovenox, SCDs - Dispo: inpatient, med-surg floor. Anticipate discharge home in next 1-2 days once having regular bowel function.    LOS: 2 days    Sophronia Simas, MD [facility] Surgery General, Hepatobiliary and Pancreatic Surgery 08/07/22 6:49 AM

## 2022-08-07 NOTE — Plan of Care (Signed)

## 2022-08-08 LAB — GLUCOSE, CAPILLARY
Glucose-Capillary: 316 mg/dL — ABNORMAL HIGH (ref 70–99)
Glucose-Capillary: 306 mg/dL — ABNORMAL HIGH (ref 70–99)
Glucose-Capillary: 165 mg/dL — ABNORMAL HIGH (ref 70–99)
Glucose-Capillary: 343 mg/dL — ABNORMAL HIGH (ref 70–99)

## 2022-08-08 MED ORDER — DOCUSATE SODIUM 100 MG PO CAPS: 100.0000 mg | ORAL_CAPSULE | Freq: Two times a day (BID) | ORAL | 0 refills | Status: DC

## 2022-08-08 MED ORDER — INSULIN GLARGINE-YFGN 100 UNIT/ML ~~LOC~~ SOLN: 10.0000 [IU] | Freq: Every day | SUBCUTANEOUS | Status: DC

## 2022-08-08 MED ORDER — INSULIN GLARGINE-YFGN 100 UNIT/ML ~~LOC~~ SOLN
5.0000 [IU] | Freq: Every day | SUBCUTANEOUS | Status: DC
  Administered 2022-08-08 – 2022-08-09 (×2): 5 [IU] via SUBCUTANEOUS
  Filled 2022-08-08 (×2): qty 0.05

## 2022-08-08 MED ORDER — INSULIN REGULAR HUMAN 100 UNIT/ML IJ SOLN
0.0000 [IU] | INTRAMUSCULAR | Status: DC
  Administered 2022-08-08 (×2): 7 [IU] via SUBCUTANEOUS
  Administered 2022-08-08 – 2022-08-09 (×3): 2 [IU] via SUBCUTANEOUS
  Administered 2022-08-09: 5 [IU] via SUBCUTANEOUS
  Administered 2022-08-09: 3 [IU] via SUBCUTANEOUS
  Filled 2022-08-08: qty 3

## 2022-08-08 MED ORDER — POLYETHYLENE GLYCOL 3350 17 G PO PACK: 17.0000 g | PACK | Freq: Every day | ORAL | 0 refills | Status: AC | PRN

## 2022-08-08 MED ORDER — INSULIN GLARGINE-YFGN 100 UNIT/ML ~~LOC~~ SOLN
8.0000 [IU] | Freq: Every day | SUBCUTANEOUS | Status: DC
  Filled 2022-08-08: qty 0.08

## 2022-08-08 MED ORDER — ACETAMINOPHEN 500 MG PO TABS: 500.0000 mg | ORAL_TABLET | Freq: Four times a day (QID) | ORAL | 0 refills | Status: AC | PRN

## 2022-08-08 NOTE — Discharge Instructions (Addendum)
Check your blood sugar 3 times daily: before breakfast, before lunch, before dinner. You may also check throughout the day if you feel that your blood sugar is too low or too high.  Humalog is your short-acting correction insulin. Check your blood sugar prior to your meal, three times daily. Then depending on your blood sugar, inject insulin as follows: Blood Glucose <150: 0 unit  BG 151 - 200: 1 units  BG 201 - 250: 2 units  BG 251 - 300: 3 units  BG 301 - 350: 4 units  BG 351 - 400: 5 units       BG >400: 6 units and call your PCP  Keep a journal of your blood sugar results each day. Bring to your primary care physician.      [facility] [place] [facility] DISCHARGE INSTRUCTIONS  Activity No heavy lifting greater than 15 pounds for 6 weeks after surgery. Ok to shower, but do not bathe or submerge incisions underwater for 2 weeks. Do not drive while taking narcotic pain medication.  Wound Care Your incisions are covered with skin glue called Dermabond. This will peel off on its own over time. You may shower and allow warm soapy water to run over your incisions. Gently pat dry. Do not submerge your incision underwater for 2 weeks after surgery. Monitor your incision for any new redness, tenderness, or drainage.  When to Call us: Fever greater than 100.5 New redness, drainage, or swelling at incision site Severe pain, nausea, or vomiting Severe abdominal bloating or distension  Dr. Freida Busman will contact you once your pathology results from the surgery come back.  Follow-up You have an appointment scheduled with Dr. Freida Busman on September 02, 2022 at 9:30am. This will be at the [facility] Surgery office at [address], [address], [place], [place]. Please arrive at least 15 minutes prior to your scheduled appointment time.  For questions or concerns, please call the office at [phone].

## 2022-08-08 NOTE — Assessment & Plan Note (Addendum)
67 year old female with known T2DM with persistent hyperglycemia following lapraroscopic surgery for mesenteric mass and bowel resection -A1C 6.8 on 07/05/2022.  -She was on ozempic but has been off x 4 months and only taking farxiga with still decent control of her blood sugar. She has gained about 20+ pounds since being off ozempic. Diet has not been ideal. [facility] is followed at [facility] and has lost a considerable amount of weight. Plan was to start back on her ozempic once cleared by surgery after her procedure.  -likely stress induced from surgery as her sugars have been decently controlled  -She has taken long acting insulin in the past. Will start her on semglee 5 units daily -allergy to novolog, have placed SSI orders with regular insulin with pharmacy. Sensitive scale  -continue accuchecks QAC/HS -close f/u with PCP for adjustment/discontinuation of insulin

## 2022-08-08 NOTE — Progress Notes (Signed)
    3 Days Post-Op  Subjective: Persistently hyperglycemic>300. No BM yet. Tolerating regular diet.   Objective: Vital signs in last 24 hours: Temp:  [98 F (36.7 C)-99.6 F (37.6 C)] 98.2 F (36.8 C) (05/19 0612) Pulse Rate:  [58-66] 58 (05/19 0612) Resp:  [15-18] 15 (05/19 0612) BP: (169-189)/(57-64) 175/64 (05/19 0612) SpO2:  [96 %-98 %] 98 % (05/19 0612) Last BM Date : 08/04/22  Intake/Output from previous day: 05/18 0701 - 05/19 0700 In: 220 [P.O.:220] Out: -  Intake/Output this shift: No intake/output data recorded.  PE: General: up in chair, NAD Neuro: alert and oriented, no focal deficits Resp: normal work of breathing on room air Abdomen: soft, nondistended, nontender to palpation. Incisions clean and dry with no erythema or induration. Extremities: warm and well-perfused   Lab Results:  Recent Labs    08/06/22 0809  WBC 6.2  HGB 10.7*  HCT 33.4*  PLT 164   BMET Recent Labs    08/06/22 0809  NA 134*  K 4.1  CL 103  CO2 23  GLUCOSE 201*  BUN 16  CREATININE 1.06*  CALCIUM 8.5*   PT/INR No results for input(s): "LABPROT", "INR" in the last 72 hours. CMP     Component Value Date/Time   NA 134 (L) 08/06/2022 0809   NA 140 07/05/2022 0946   K 4.1 08/06/2022 0809   CL 103 08/06/2022 0809   CO2 23 08/06/2022 0809   GLUCOSE 201 (H) 08/06/2022 0809   BUN 16 08/06/2022 0809   BUN 20 07/05/2022 0946   CREATININE 1.06 (H) 08/06/2022 0809   CREATININE 0.74 08/30/2011 1307   CALCIUM 8.5 (L) 08/06/2022 0809   PROT 7.0 07/28/2022 1000   PROT 7.0 07/05/2022 0946   ALBUMIN 3.5 07/28/2022 1000   ALBUMIN 4.2 07/05/2022 0946   AST 27 07/28/2022 1000   ALT 39 07/28/2022 1000   ALKPHOS 114 07/28/2022 1000   BILITOT 0.4 07/28/2022 1000   BILITOT 0.3 07/05/2022 0946   GFRNONAA 57 (L) 08/06/2022 0809   GFRAA 57 (L) 02/21/2020 1500   Lipase     Component Value Date/Time   LIPASE 54 03/23/2022 1032       Studies/Results: No results  found.      Assessment/Plan 67 y o female with mesenteric mass and small bowel mass, POD3 s/p laparoscopic-assisted enucleation of mesenteric mass and small bowel resection. - Carb control diet, SLIV - Multimodal pain control - HTN: Home coreg and norvasc - Hyperglycemia secondary to DM: Patient is on home [place]. Has allergy to Novolog. Will consult TRH today for persistent hyperglycemia>300. - CAD: on home aspirin 81mg  daily - Ambulate TID - VTE: lovenox, SCDs - Dispo: inpatient, med-surg floor. Anticipate discharge home tomorrow if having bowel movements.    LOS: 3 days    Leslie Simas, MD [facility] Surgery General, Hepatobiliary and Pancreatic Surgery 08/08/22 6:53 AM

## 2022-08-08 NOTE — Plan of Care (Signed)

## 2022-08-08 NOTE — Consult Note (Signed)
Initial Consultation Note   Patient: Leslie Davis:[id number] DOB: [birth year] PCP: Dorothyann Peng, MD DOA: 08/05/2022 DOS: the patient was seen and examined on 08/08/2022 Primary service: Fritzi Mandes, MD  Referring physician: Dr. Freida Busman  Reason for consult: persistent hyperglycemia in setting of T2DM   Assessment/Plan: Assessment and Plan: * Type 2 diabetes mellitus with hyperlipidemia [facility]) 67 year old female with known T2DM with persistent hyperglycemia following lapraroscopic surgery for mesenteric mass and bowel resection -A1C 6.8 on 07/05/2022.  -She was on ozempic but has been off x 4 months and only taking farxiga with still decent control of her blood sugar. She has gained about 20+ pounds since being off ozempic. Diet has not been ideal. She is followed at [facility] and has lost a considerable amount of weight. Plan was to start back on her ozempic once cleared by surgery after her procedure.  -likely stress induced from surgery as her sugars have been decently controlled  -She has taken long acting insulin in the past. Will start her on semglee 5 units daily -allergy to novolog, have placed SSI orders with regular insulin with pharmacy. Sensitive scale  -continue accuchecks QAC/HS -close f/u with PCP for adjustment/discontinuation of insulin   Small bowel mass POD #4, per surgery   Mesenteric mass POD #4 Per surgery      TRH will continue to follow the patient.  HPI: Leslie Davis is a 67 y.o. female with past medical history of T2DM, HLD, CAD, CKD stage 3, fatty liver, HTN, RA, GERD who was admitted for planned surgery for mesenteric mass on 08/05/2022 by Dr. Freida Busman. She underwent laparoscopic excision of mass and laparoscopic-assisted small bowel resection (5cm). No complications during surgery. Post op she has had consistent blood sugars >300. She has an allergy to Novolog and is only on farxiga. TRH was consulted for persistent  hyperglycemia. She tells me she was on ?humalog before?   She is doing well. Passing gas, no BM yet. Sore in abdomen, but overall feels okay with no complaints.   Review of Systems: As mentioned in the history of present illness. All other systems reviewed and are negative. Past Medical History:  Diagnosis Date   CKD (chronic kidney disease)    stage 3   Coronary artery disease 07/2021   obstructive CAD-70% LAD-treated medically   DM (diabetes mellitus) (HCC)    Fatty liver disease, nonalcoholic    Gastritis    Gastroparesis    GERD (gastroesophageal reflux disease)    Heartburn    Hiatal hernia    HLD (hyperlipidemia)    HTN (hypertension)    Joint pain    Renal calculus    Rheumatoid arthritis (HCC)    Vitamin D deficiency    Past Surgical History:  Procedure Laterality Date   BREAST BIOPSY  02/04/2022   MM LT RADIOACTIVE SEED EA ADD LESION LOC MAMMO GUIDE 02/04/2022 [facility] MAMMO[facility]AP[facility]   BREAST BIOPSY  02/04/2022   MM LT RADIOACTIVE SEED LOC MAMMO GUIDE 02/04/2022 [facility] MAMMOGR[facility]   BREAST LUMPECTOMY WITH RADIOACTIVE SEED LOCALIZATION Left 02/05/2022   Procedure: LEFT BREAST BRACKETED LUMPECTOMY WITH RADIOACTIVE SEED LOCALIZATION;  Surgeon: Griselda Miner, MD;  Location: [facility];  Service: General;  Laterality: Left;   CHOLECYSTECTOMY     COLONOSCOPY  09/10/2011   normal   JOINT REPLACEMENT  2022   left TKA   LAPAROSCOPIC REMOVAL ABDOMINAL MASS N/A 08/05/2022   Procedure: LAPAROSCOPIC ASSISTED EXCISION OF MESENTERIC MASS;  Surgeon: Fritzi Mandes, MD;  Location: [facility] OR;  Service: General;  Laterality: N/A;   LAPAROSCOPIC SMALL BOWEL RESECTION N/A 08/05/2022   Procedure: LAPAROSCOPIC SMALL BOWEL RESECTION;  Surgeon: Fritzi Mandes, MD;  Location[facility] OR;  Service: General;  Laterality: N/A;   LEFT HEART CATH AND CORONARY ANGIOGRAPHY N/A 08/18/2021   Procedure: LEFT HEART CATH AND CORONARY ANGIOGRAPHY;  Surgeon: Elder Negus, MD;  Location: [facility]  INVASIVE CV LAB;  Service: Cardiovascular;  Laterality: N/A;   TOTAL KNEE ARTHROPLASTY Left 10/27/2020   Procedure: LEFT TOTAL KNEE ARTHROPLASTY;  Surgeon: Tarry Kos, MD;  Location: [facility] OR;  Service: Orthopedics;  Laterality: Left;   TUBAL LIGATION     UPPER GASTROINTESTINAL ENDOSCOPY     Social History:  reports that she quit smoking about 47 years ago. Her smoking use included cigarettes. She has a 0.25 pack-year smoking history. She has never used smokeless tobacco. She reports that she does not drink alcohol and does not use drugs.  Allergies  Allergen Reactions   Fiasp [Insulin Aspart (W-Niacinamide)] Hives    Family History  Problem Relation Age of Onset   Diabetes Mother    Thyroid disease Mother    Kidney disease Mother    Hypertension Father    Hyperlipidemia Father    Sudden death Father    Stroke Father    Diabetes Sister    Heart disease Sister    Kidney disease Sister    COPD Sister    Heart disease Brother 13   Diabetes Brother    Stomach cancer Maternal Aunt        X 2 aunts   Diabetes Maternal Grandmother    Colon cancer Neg Hx    Esophageal cancer Neg Hx    Rectal cancer Neg Hx    Colon polyps Neg Hx     Prior to Admission medications   Medication Sig Start Date End Date Taking? Authorizing Provider  amLODipine (NORVASC) 10 MG tablet TAKE ONE TABLET BY MOUTH ONCE DAILY 09/16/21  Yes Arnette Felts, FNP  aspirin EC 81 MG tablet Take 1 tablet (81 mg total) by mouth daily. Swallow whole. 09/16/21 09/16/22 Yes Dorothyann Peng, MD  carvedilol (COREG) 3.125 MG tablet TAKE ONE TABLET BY MOUTH TWICE DAILY WITH A MEAL 03/18/22  Yes Tolia, Sunit, DO  celecoxib (CELEBREX) 200 MG capsule Take 1 capsule (200 mg total) by mouth 2 (two) times daily as needed. 09/29/21  Yes Cristie Hem, PA-C  cetirizine (ZYRTEC ALLERGY) 10 MG tablet Take 1 tablet (10 mg total) by mouth daily. 06/16/22 06/16/23 Yes Dorothyann Peng, MD  clindamycin (CLEOCIN T) 1 % lotion Apply 1 Application  topically daily as needed (acne). 07/21/22  Yes [provider]  EDARBI 80 MG TABS TAKE ONE TABLET BY MOUTH ONCE DAILY 12/17/21  Yes Dorothyann Peng, MD  FARXIGA 10 MG TABS tablet TAKE ONE TABLET BY MOUTH ONCE DAILY BEFORE BREAKFAST 12/17/21  Yes Dorothyann Peng, MD  ferrous gluconate (FERGON) 324 MG tablet TAKE 1 TABLET BY MOUTH TWICE DAILY WITH A MEAL. 05/28/19  Yes Mansouraty, Netty Starring., MD  nitroGLYCERIN (NITROSTAT) 0.4 MG SL tablet Place 1 tablet (0.4 mg total) under the tongue every 5 (five) minutes as needed for chest pain. If you require more than two tablets five minutes apart go to the nearest ER via EMS. 08/03/21 07/27/22 Yes Tolia, Sunit, DO  pantoprazole (PROTONIX) 40 MG tablet TAKE ONE TABLET BY MOUTH TWICE DAILY 08/03/22  Yes Mansouraty, Netty Starring., MD  rosuvastatin (CRESTOR) 20 MG tablet TAKE ONE TABLET BY MOUTH ONCE DAILY 12/17/21  Yes Dorothyann Peng, MD  temazepam (RESTORIL) 30 MG capsule TAKE ONE CAPSULE BY MOUTH AT bedtime AS NEEDED 05/18/22  Yes Dorothyann Peng, MD  benzonatate (TESSALON PERLES) 100 MG capsule Take 1 capsule (100 mg total) by mouth 3 (three) times daily as needed for cough. Patient not taking: Reported on 07/05/2022 04/20/22 04/20/23  Dorothyann Peng, MD  Blood Glucose Monitoring Suppl [facility] VERIO) w/Device KIT Use as directed to check blood sugars 2 times per day dx: e11.65 02/13/19   Dorothyann Peng, MD  ezetimibe (ZETIA) 10 MG tablet Take 1 tablet (10 mg total) by mouth daily. Patient not taking: Reported on 07/27/2022 02/23/22 02/23/23  Odis Hollingshead, Sunit, DO  glucose blood (ONETOUCH VERIO) test strip USE TO check blood glucose TWICE DAILY AS DIRECTED 12/28/21   Dorothyann Peng, MD  linaclotide [facility]) 145 MCG CAPS capsule Take 1 capsule (145 mcg total) by mouth daily. 08/02/22   [place], Thermon Leyland, FNP  ondansetron (ZOFRAN) 4 MG tablet Take 1 tablet (4 mg total) by mouth every 8 (eight) hours as needed for nausea or vomiting. 08/02/22   Dorothyann Peng, MD  OneTouch Delica  Lancets 33G MISC Use as directed to check blood sugars 2 times per day dx:e11.65 02/13/19   Dorothyann Peng, MD  [facility], 2 MG/DOSE, 8 MG/3ML SOPN Inject 2mg  into THE SKIN ONCE WEEKLY Patient not taking: Reported on 07/05/2022 05/03/22   Dorothyann Peng, MD  sucralfate (CARAFATE) 1 g tablet Take 1 tablet (1 g total) by mouth 4 (four) times daily -  with meals and at bedtime. Patient not taking: Reported on 07/05/2022 05/11/22   Mansouraty, Netty Starring., MD  TRUEPLUS PEN NEEDLES 31G X 6 MM MISC USE WITH pen TO INJECT insulin DAILY 12/01/20   Arnette Felts, FNP    Physical Exam: Vitals:   08/07/22 1616 08/07/22 2154 08/08/22 0612 08/08/22 0811  BP: (!) 189/57 (!) 185/61 (!) 175/64 (!) 181/57  Pulse: 63 66 (!) 58 (!) 56  Resp: 18 16 15 17   Temp: 98.6 F (37 C) 99.6 F (37.6 C) 98.2 F (36.8 C) 98.3 F (36.8 C)  TempSrc: Oral Oral Oral Oral  SpO2: 98% 96% 98% 98%  Weight:      Height:       Physical Exam Vitals reviewed.  Constitutional:      Appearance: She is obese.  HENT:     Head: Normocephalic and atraumatic.  Cardiovascular:     Rate and Rhythm: Normal rate and regular rhythm.     Heart sounds: Normal heart sounds.  Pulmonary:     Effort: Pulmonary effort is normal.     Breath sounds: Normal breath sounds.  Abdominal:     General: Bowel sounds are normal.     Palpations: Abdomen is soft.  Musculoskeletal:     Cervical back: Normal range of motion and neck supple.     Right lower leg: No edema.     Left lower leg: No edema.  Skin:    General: Skin is warm and dry.  Neurological:     General: No focal deficit present.     Mental Status: She is alert and oriented to person, place, and time.  Psychiatric:        Mood and Affect: Mood normal.        Behavior: Behavior normal.     Data Reviewed:   Reviewed .   CBGs 248-300+ Fasting: 316 today  Family Communication: none  Primary team communication: Dr. Freida Busman  Thank you very much for involving [place] in the care of your  patient.  Author: Orland Mustard, MD 08/08/2022 11:16 AM  For on call review [email or web address].

## 2022-08-08 NOTE — Assessment & Plan Note (Signed)
POD #4 Per surgery

## 2022-08-08 NOTE — Assessment & Plan Note (Signed)
POD #4, per surgery

## 2022-08-09 DIAGNOSIS — E1169 Type 2 diabetes mellitus with other specified complication: Secondary | ICD-10-CM

## 2022-08-09 DIAGNOSIS — E785 Hyperlipidemia, unspecified: Secondary | ICD-10-CM | POA: Diagnosis not present

## 2022-08-09 LAB — GLUCOSE, CAPILLARY
Glucose-Capillary: 193 mg/dL — ABNORMAL HIGH (ref 70–99)
Glucose-Capillary: 173 mg/dL — ABNORMAL HIGH (ref 70–99)
Glucose-Capillary: 264 mg/dL — ABNORMAL HIGH (ref 70–99)
Glucose-Capillary: 211 mg/dL — ABNORMAL HIGH (ref 70–99)

## 2022-08-09 MED ORDER — OXYCODONE HCL 5 MG PO TABS: 5.0000 mg | ORAL_TABLET | Freq: Four times a day (QID) | ORAL | 0 refills | Status: DC | PRN

## 2022-08-09 MED ORDER — PEN NEEDLES 31G X 5 MM MISC
1.0000 | Freq: Three times a day (TID) | 0 refills | Status: AC
Start: 2022-08-09 — End: ?

## 2022-08-09 MED ORDER — BISACODYL 10 MG RE SUPP
10.0000 mg | Freq: Once | RECTAL | Status: AC
  Administered 2022-08-09: 10 mg via RECTAL
  Filled 2022-08-09: qty 1

## 2022-08-09 MED ORDER — INSULIN LISPRO (1 UNIT DIAL) 100 UNIT/ML (KWIKPEN): 0.0000 [IU] | PEN_INJECTOR | Freq: Three times a day (TID) | SUBCUTANEOUS | 0 refills | Status: DC

## 2022-08-09 MED ORDER — METHOCARBAMOL 500 MG PO TABS: 500.0000 mg | ORAL_TABLET | Freq: Three times a day (TID) | ORAL | 0 refills | Status: AC | PRN

## 2022-08-09 NOTE — Progress Notes (Signed)
PROGRESS NOTE    Leslie Davis  ZOX:[id number] DOB: [birth year] DOA: 08/05/2022 PCP: Dorothyann Peng, MD     Brief Narrative:  Leslie Davis is a 67 y.o. female with past medical history of T2DM, HLD, CAD, CKD stage 3, fatty liver, HTN, RA, GERD who was admitted for planned surgery for mesenteric mass on 08/05/2022 by Dr. Freida Busman. She underwent laparoscopic excision of mass and laparoscopic-assisted small bowel resection (5cm). No complications during surgery. Post op she has had consistent blood sugars >300. She has an allergy to Novolog and is only on farxiga. TRH was consulted for persistent hyperglycemia.   New events last 24 hours / Subjective: Patient with some pain, but otherwise doing well.  Blood sugars last 24 hours ranging 165-264, which is an improvement from blood sugars in the 300s.  Assessment & Plan:   Principal Problem:   Type 2 diabetes mellitus with hyperlipidemia (HCC) Active Problems:   Mesenteric mass   Small bowel mass  Patient was admitted for mesenteric mass, small bowel mass, underwent laparoscopic-assisted manipulation of mesenteric mass and small bowel resection by Dr. Freida Busman 5/16.  [facility] team was consulted 5/19 for blood sugar control.  Spoke with patient regarding her DM care. Her A1c is 6.8, although she has been hyperglycemic in the hospital with BG 300s. She states she was on humalog when she was first diagnosed as diabetic and is familiar with insulin injections. She was taking ozempic and doing well, but has been off of it the last month. She is now on farxiga. She is interested in getting back on ozempic as outpatient. I recommend starting sensitive sliding scale humalog TID with meals at home. I suspect that she will not need this long term. I recommended to her to follow up closely with her PCP and get back on ozempic. Her PCP can adjust/discontinue humalog as indicated. She voiced understanding. Humalog pen and needles sent to her  outpatient pharmacy and sliding scale listed on her discharge instructions. She is anticipating discharge home later today.      Antimicrobials:  Anti-infectives (From admission, onward)    Start     Dose/Rate Route Frequency Ordered Stop   08/05/22 0907  ceFAZolin (ANCEF) 2-4 GM/100ML-% IVPB       Note to Pharmacy: Isabel Caprice, Destiny: cabinet override      08/05/22 0907 08/05/22 1143   08/05/22 0906  metroNIDAZOLE (FLAGYL) 500 MG/100ML IVPB       Note to Pharmacy: Camillo Flaming: cabinet override      08/05/22 0906 08/05/22 1152   08/05/22 0900  ceFAZolin (ANCEF) IVPB 2g/100 mL premix       See Hyperspace for full Linked Orders Report.   2 g 200 mL/hr over 30 Minutes Intravenous On call to O.R. 08/05/22 8119 08/05/22 1137   08/05/22 0900  metroNIDAZOLE (FLAGYL) IVPB 500 mg       See Hyperspace for full Linked Orders Report.   500 mg 100 mL/hr over 60 Minutes Intravenous On call to O.R. 08/05/22 0852 08/05/22 1148        Objective: Vitals:   08/08/22 1621 08/08/22 2017 08/09/22 0516 08/09/22 0820  BP: (!) 182/69 (!) 171/76 (!) 178/54 (!) 175/53  Pulse: 60 61 (!) 56 (!) 58  Resp: 18 18 18 16   Temp: 98 F (36.7 C) 97.6 F (36.4 C) 98.3 F (36.8 C) 98.3 F (36.8 C)  TempSrc:  Oral Oral Oral  SpO2: 98% 100% 97% 97%  Weight:  Height:        Intake/Output Summary (Last 24 hours) at 08/09/2022 0925 Last data filed at 08/09/2022 0820 Gross per 24 hour  Intake 220 ml  Output --  Net 220 ml   Filed Weights   08/05/22 0845  Weight: 85.7 kg    Examination:  General exam: Appears calm and comfortable  Respiratory system: Clear to auscultation. Respiratory effort normal. No respiratory distress. No conversational dyspnea.  Cardiovascular system: S1 & S2 heard, RRR. No murmurs. No pedal edema. Gastrointestinal system: Abdomen is nondistended, soft and nontender. Normal bowel sounds heard. Central nervous system: Alert and oriented. No focal neurological deficits. Speech  clear.  Extremities: Symmetric in appearance  Skin: No rashes, lesions or ulcers on exposed skin  Psychiatry: Judgement and insight appear normal. Mood & affect appropriate.   Data Reviewed: I have personally reviewed following labs and imaging studies  CBC: Recent Labs  Lab 08/06/22 0809  WBC 6.2  HGB 10.7*  HCT 33.4*  MCV 88.1  PLT 164   Basic Metabolic Panel: Recent Labs  Lab 08/06/22 0809  NA 134*  K 4.1  CL 103  CO2 23  GLUCOSE 201*  BUN 16  CREATININE 1.06*  CALCIUM 8.5*   GFR: Estimated Creatinine Clearance: 51.6 mL/min (A) (by C-G formula based on SCr of 1.06 mg/dL (H)). Liver Function Tests: No results for input(s): "AST", "ALT", "ALKPHOS", "BILITOT", "PROT", "ALBUMIN" in the last 168 hours. No results for input(s): "LIPASE", "AMYLASE" in the last 168 hours. No results for input(s): "AMMONIA" in the last 168 hours. Coagulation Profile: No results for input(s): "INR", "PROTIME" in the last 168 hours. Cardiac Enzymes: No results for input(s): "CKTOTAL", "CKMB", "CKMBINDEX", "TROPONINI" in the last 168 hours. BNP (last 3 results) No results for input(s): "PROBNP" in the last 8760 hours. HbA1C: No results for input(s): "HGBA1C" in the last 72 hours. CBG: Recent Labs  Lab 08/08/22 1620 08/08/22 2019 08/09/22 0106 08/09/22 0516 08/09/22 0817  GLUCAP 165* 343* 193* 173* 264*   Lipid Profile: No results for input(s): "CHOL", "HDL", "LDLCALC", "TRIG", "CHOLHDL", "LDLDIRECT" in the last 72 hours. Thyroid Function Tests: No results for input(s): "TSH", "T4TOTAL", "FREET4", "T3FREE", "THYROIDAB" in the last 72 hours. Anemia Panel: No results for input(s): "VITAMINB12", "FOLATE", "FERRITIN", "TIBC", "IRON", "RETICCTPCT" in the last 72 hours. Sepsis Labs: No results for input(s): "PROCALCITON", "LATICACIDVEN" in the last 168 hours.  No results found for this or any previous visit (from the past 240 hour(s)).    Radiology Studies: No results  found.    Scheduled Meds:  acetaminophen  1,000 mg Oral TID   amLODipine  10 mg Oral Daily   aspirin EC  81 mg Oral Daily   bisacodyl  10 mg Rectal Once   carvedilol  3.125 mg Oral BID WC   dapagliflozin propanediol  10 mg Oral Daily   docusate sodium  100 mg Oral BID   enoxaparin (LOVENOX) injection  40 mg Subcutaneous Q24H   insulin glargine-yfgn  5 Units Subcutaneous Daily   insulin regular  0-9 Units Subcutaneous Q4H   methocarbamol  500 mg Oral TID   pantoprazole  40 mg Oral BID   polyethylene glycol  17 g Oral Daily   rosuvastatin  20 mg Oral Daily   Continuous Infusions:   LOS: 4 days   Time spent: 25 minutes   Noralee Stain, DO [facility] 08/09/2022, 9:25 AM   Available via Epic secure chat 7am-7pm After these hours, please refer to coverage provider listed  on [email or web address]

## 2022-08-09 NOTE — Inpatient Diabetes Management (Signed)
Inpatient Diabetes Program Recommendations  AACE/ADA: New Consensus Statement on Inpatient Glycemic Control (2015)  Target Ranges:  Prepandial:   less than 140 mg/dL      Peak postprandial:   less than 180 mg/dL (1-2 hours)      Critically ill patients:  140 - 180 mg/dL   Lab Results  Component Value Date   GLUCAP 211 (H) 08/09/2022   HGBA1C 6.8 (H) 07/05/2022    Review of Glycemic Control  Latest Reference Range & Units 08/08/22 08:13 08/08/22 11:53 08/08/22 16:20 08/08/22 20:19 08/09/22 01:06 08/09/22 05:16 08/09/22 08:17 08/09/22 11:47  Glucose-Capillary 70 - 99 mg/dL 914 (H) 782 (H) 956 (H) 343 (H) 193 (H) 173 (H) 264 (H) 211 (H)   Diabetes history: DM  Outpatient Diabetes medications:  Farxiga 10 mg daily Ozempic 2 mg weekly (not taking) Current orders for Inpatient glycemic control:  Farxiga 10 mg daily Semglee 5 units daily Novolin R 0-9 units tid with meals  Inpatient Diabetes Program Recommendations:    Note plans for patient to d/c with Humalog correction scale at d/c.  Spoke with patient at bedside.  Reminded her of the importance of controlling blood sugars post-op for healing.  She see's Dr. Allyne Gee.  Recommended that she call Dr. Allyne Gee if CBG's remain>200 mg/dL at home.  Patient verbalized understanding and states she knows how to use insulin pens.  She also has meter/strips at home.   Thanks,  Beryl Meager, RN, BC-ADM Inpatient Diabetes Coordinator Pager [phone]  (8a-5p)

## 2022-08-09 NOTE — Progress Notes (Signed)
Leslie Davis to be D/C'd  per MD order.  Discussed with the patient and all questions fully answered.  VSS, Skin clean, dry and intact without evidence of skin break down, no evidence of skin tears noted.  IV catheter discontinued intact. Site without signs and symptoms of complications. Dressing and pressure applied.  An After Visit Summary was printed and given to the patient.   D/c education completed with patient/family including follow up instructions, medication list, d/c activities limitations if indicated, with other d/c instructions as indicated by MD - patient able to verbalize understanding, all questions fully answered.   Patient instructed to return to ED, call 911, or call MD for any changes in condition.   Patient to be escorted via WC, and D/C home via private auto.

## 2022-08-09 NOTE — Care Management Important Message (Signed)
Important Message  Patient Details  Name: Leslie Davis MRN: [id number] Date of Birth: 12-Apr-1955   Medicare Important Message Given:  Yes     Sherilyn Banker 08/09/2022, 2:22 PM

## 2022-08-09 NOTE — Progress Notes (Signed)
    4 Days Post-Op  Subjective: No BM yet. Passing flatus, tolerating regular diet. Blood sugar improved.   Objective: Vital signs in last 24 hours: Temp:  [97.6 F (36.4 C)-98.3 F (36.8 C)] 98.3 F (36.8 C) (05/20 0820) Pulse Rate:  [56-61] 58 (05/20 0820) Resp:  [16-18] 16 (05/20 0820) BP: (171-182)/(53-76) 175/53 (05/20 0820) SpO2:  [97 %-100 %] 97 % (05/20 0820) Last BM Date : 08/04/22  Intake/Output from previous day: 05/19 0701 - 05/20 0700 In: 220 [P.O.:220] Out: -  Intake/Output this shift: Total I/O In: 220 [P.O.:220] Out: -   PE: General: up in chair, NAD Neuro: alert and oriented, no focal deficits Resp: normal work of breathing on room air Abdomen: soft, nondistended, nontender to palpation. Incisions clean and dry with no erythema or induration. Extremities: warm and well-perfused   Lab Results:  No results for input(s): "WBC", "HGB", "HCT", "PLT" in the last 72 hours.  BMET No results for input(s): "NA", "K", "CL", "CO2", "GLUCOSE", "BUN", "CREATININE", "CALCIUM" in the last 72 hours.  PT/INR No results for input(s): "LABPROT", "INR" in the last 72 hours. CMP     Component Value Date/Time   NA 134 (L) 08/06/2022 0809   NA 140 07/05/2022 0946   K 4.1 08/06/2022 0809   CL 103 08/06/2022 0809   CO2 23 08/06/2022 0809   GLUCOSE 201 (H) 08/06/2022 0809   BUN 16 08/06/2022 0809   BUN 20 07/05/2022 0946   CREATININE 1.06 (H) 08/06/2022 0809   CREATININE 0.74 08/30/2011 1307   CALCIUM 8.5 (L) 08/06/2022 0809   PROT 7.0 07/28/2022 1000   PROT 7.0 07/05/2022 0946   ALBUMIN 3.5 07/28/2022 1000   ALBUMIN 4.2 07/05/2022 0946   AST 27 07/28/2022 1000   ALT 39 07/28/2022 1000   ALKPHOS 114 07/28/2022 1000   BILITOT 0.4 07/28/2022 1000   BILITOT 0.3 07/05/2022 0946   GFRNONAA 57 (L) 08/06/2022 0809   GFRAA 57 (L) 02/21/2020 1500   Lipase     Component Value Date/Time   LIPASE 54 03/23/2022 1032       Studies/Results: No results  found.      Assessment/Plan 67 y o female with mesenteric mass and small bowel mass, POD4 s/p laparoscopic-assisted enucleation of mesenteric mass and small bowel resection. - Carb control diet, SLIV - Multimodal pain control - HTN: Home coreg and norvasc - Hyperglycemia secondary to DM: Home [place]. Appreciated TRH assistance, on Semglee and sliding scale regular insulin. Will request discharge recommendations for insulin. - CAD: on home aspirin 81mg  daily - Ambulate TID - VTE: lovenox, SCDs - Dispo: inpatient, med-surg floor. Give suppository this morning, possible discharge this afternoon.    LOS: 4 days    Sophronia Simas, MD [facility] Surgery General, Hepatobiliary and Pancreatic Surgery 08/09/22 9:50 AM

## 2022-08-10 ENCOUNTER — Ambulatory Visit: Payer: Self-pay

## 2022-08-10 ENCOUNTER — Telehealth: Payer: Self-pay

## 2022-08-10 LAB — SURGICAL PATHOLOGY

## 2022-08-10 NOTE — Transitions of Care (Post Inpatient/ED Visit) (Signed)
08/10/2022  Name: Leslie Davis MRN: [id number] DOB: [birth year]  Today's TOC FU Call Status: Today's TOC FU Call Status:: Successful TOC FU Call Competed TOC FU Call Complete Date: 08/10/22  Transition Care Management Follow-up Telephone Call Date of Discharge: 08/09/22 Discharge Facility: [facility]) Type of Discharge: Inpatient Admission Primary Inpatient Discharge Diagnosis:: 'type 2 DM" How have you been since you were released from the hospital?: Better (Pt states she is "doing okay-still has some pain at times." Appettie good. She voices blood sugars 'better since home-ranging in the 160s-180s.") Any questions or concerns?: No  Items Reviewed: Did you receive and understand the discharge instructions provided?: Yes Medications obtained,verified, and reconciled?: Yes (Medications Reviewed) (Pt reports she is waiting for [facility] to deliver her meds this afternoon) Any new allergies since your discharge?: No Dietary orders reviewed?: Yes Type of Diet Ordered:: low salt/heart healthy/carb modified Do you have support at home?: Yes  Medications Reviewed Today: Medications Reviewed Today     Reviewed by Charlyn Minerva, RN (Registered Nurse) on 08/10/22 at 1412  Med List Status: <None>   Medication Order Taking? Sig Documenting Provider Last Dose Status Informant  acetaminophen (TYLENOL) 500 MG tablet [id number]  Take 1 tablet (500 mg total) by mouth every 6 (six) hours as needed for mild pain. Fritzi Mandes, MD  Active   amLODipine (NORVASC) 10 MG tablet [id number] No TAKE ONE TABLET BY MOUTH ONCE DAILY Arnette Felts, FNP 08/05/2022 Active Self  aspirin EC 81 MG tablet [id number] No Take 1 tablet (81 mg total) by mouth daily. Swallow whole. Dorothyann Peng, MD 07/22/2022 Active Self  Blood Glucose Monitoring Suppl [facility] VERIO) w/Device KIT [id number] No Use as directed to check blood sugars 2 times per day dx: e11.65 Dorothyann Peng, MD Taking Active Self   carvedilol (COREG) 3.125 MG tablet [id number] No TAKE ONE TABLET BY MOUTH TWICE DAILY WITH A MEAL Tolia, Sunit, DO 08/05/2022 Active Self  celecoxib (CELEBREX) 200 MG capsule [id number] No Take 1 capsule (200 mg total) by mouth 2 (two) times daily as needed. Cristie Hem, PA-C Past Week Active Self  cetirizine (ZYRTEC ALLERGY) 10 MG tablet [id number] No Take 1 tablet (10 mg total) by mouth daily. Dorothyann Peng, MD 08/04/2022 Active Self  clindamycin (CLEOCIN T) 1 % lotion [id number] No Apply 1 Application topically daily as needed (acne). [provider] 08/04/2022 Active Self  docusate sodium (COLACE) 100 MG capsule [id number]  Take 1 capsule (100 mg total) by mouth 2 (two) times daily. Fritzi Mandes, MD  Active   EDARBI 80 MG TABS [id number] No TAKE ONE TABLET BY MOUTH ONCE DAILY Dorothyann Peng, MD 08/04/2022 Active Self  ezetimibe (ZETIA) 10 MG tablet [id number] No Take 1 tablet (10 mg total) by mouth daily.  Patient not taking: Reported on 07/27/2022   Tessa Lerner, DO Not Taking Active Self  FARXIGA 10 MG TABS tablet [id number] No TAKE ONE TABLET BY MOUTH ONCE DAILY BEFORE Kathe Mariner, MD 08/01/2022 Active Self  ferrous gluconate (FERGON) 324 MG tablet [id number] No TAKE 1 TABLET BY MOUTH TWICE DAILY WITH A MEAL. Mansouraty, Netty Starring., MD Past Week Active Self  glucose blood [facility] VERIO) test strip [id number] No USE TO check blood glucose TWICE DAILY AS DIRECTED Dorothyann Peng, MD Taking Active Self  insulin lispro (HUMALOG) 100 UNIT/ML KwikPen [id number]  Inject 0-6 Units into the skin 3 (three) times daily with meals. Check Blood Glucose (BG) and inject per scale: BG <150= 0 unit; BG  150-200= 1 unit; BG 201-250= 2 unit; BG 251-300= 3 unit; BG 301-350= 4 unit; BG 351-400= 5 unit; BG >400= 6 unit and Call Primary Care. Noralee Stain, DO  Active   Insulin Pen Needle (PEN NEEDLES) 31G X 5 MM MISC [id number]  1 each by Does not apply route 3 (three) times daily. May dispense any  manufacturer covered by patient's insurance. Noralee Stain, DO  Active   linaclotide [facility]) 145 MCG CAPS capsule [id number] No Take 1 capsule (145 mcg total) by mouth daily. Whitmire, Thermon Leyland, FNP More than a month Active   methocarbamol (ROBAXIN) 500 MG tablet [id number]  Take 1 tablet (500 mg total) by mouth every 8 (eight) hours as needed for up to 7 days for muscle spasms. Fritzi Mandes, MD  Active   nitroGLYCERIN (NITROSTAT) 0.4 MG SL tablet [id number] No Place 1 tablet (0.4 mg total) under the tongue every 5 (five) minutes as needed for chest pain. If you require more than two tablets five minutes apart go to the nearest ER via EMS. Odis Hollingshead, Sunit, DO Taking Expired 07/27/22 2359 Self  ondansetron (ZOFRAN) 4 MG tablet [id number] No Take 1 tablet (4 mg total) by mouth every 8 (eight) hours as needed for nausea or vomiting. Dorothyann Peng, MD 07/31/2022 Active   OneTouch Delica Lancets 33G MISC [id number] No Use as directed to check blood sugars 2 times per day dx:e11.65 Dorothyann Peng, MD Taking Active Self  oxyCODONE (OXY IR/ROXICODONE) 5 MG immediate release tablet [id number]  Take 1 tablet (5 mg total) by mouth every 6 (six) hours as needed for severe pain. Fritzi Mandes, MD  Active   pantoprazole (PROTONIX) 40 MG tablet [id number] No TAKE ONE TABLET BY MOUTH TWICE DAILY Mansouraty, Netty Starring., MD 08/04/2022 Active   polyethylene glycol (MIRALAX / GLYCOLAX) 17 g packet [id number]  Take 17 g by mouth daily as needed for mild constipation. Fritzi Mandes, MD  Active   rosuvastatin (CRESTOR) 20 MG tablet [id number] No TAKE ONE TABLET BY MOUTH ONCE DAILY Dorothyann Peng, MD 08/04/2022 Active Self  sucralfate (CARAFATE) 1 g tablet [id number] No Take 1 tablet (1 g total) by mouth 4 (four) times daily -  with meals and at bedtime.  Patient not taking: Reported on 07/05/2022   Mansouraty, Netty Starring., MD Not Taking Active Self  temazepam (RESTORIL) 30 MG capsule [id number] No TAKE ONE CAPSULE BY MOUTH AT bedtime  AS NEEDED Dorothyann Peng, MD Past Month Active Self  TRUEPLUS PEN NEEDLES 31G X 6 MM MISC [id number] No USE WITH pen TO INJECT insulin DAILY Arnette Felts, FNP Taking Active Self            Home Care and Equipment/Supplies: Were Home Health Services Ordered?: NA Any new equipment or medical supplies ordered?: NA  Functional Questionnaire: Do you need assistance with bathing/showering or dressing?: No Do you need assistance with meal preparation?: No Do you need assistance with eating?: No Do you have difficulty maintaining continence: No Do you need assistance with getting out of bed/getting out of a chair/moving?: No Do you have difficulty managing or taking your medications?: No  Follow up appointments reviewed: PCP Follow-up appointment confirmed?: Yes Date of PCP follow-up appointment?: 08/24/22 Follow-up Provider: Dr. Allyne Gee [facility] Follow-up appointment confirmed?: Yes Date of Specialist follow-up appointment?: 08/26/22 Follow-Up Specialty Provider:: Dr. Odis Hollingshead Do you need transportation to your follow-up appointment?: No (pt confirms she is able to get to follow up appts) Do you understand care options if your condition(s) worsen?:  Yes-patient verbalized understanding  SDOH Interventions Today    Flowsheet Row Most Recent Value  SDOH Interventions   Food Insecurity Interventions Intervention Not Indicated  Transportation Interventions Intervention Not Indicated      TOC Interventions Today    Flowsheet Row Most Recent Value  TOC Interventions   TOC Interventions Discussed/Reviewed TOC Interventions Discussed, Arranged PCP follow up less than 12 days/Care Guide scheduled      Interventions Today    Flowsheet Row Most Recent Value  Chronic Disease   Chronic disease during today's visit Diabetes  General Interventions   General Interventions Discussed/Reviewed General Interventions Discussed, Doctor Visits, Durable Medical Equipment (DME)  Doctor  Visits Discussed/Reviewed PCP, Doctor Visits Discussed  Durable Medical Equipment (DME) Glucomoter  [pt reports she is checking blood sugars 4x/day on avg]  PCP/Specialist Visits Compliance with follow-up visit  Education Interventions   Education Provided Provided Education  Provided Verbal Education On Nutrition, When to see the doctor, Medication, Blood Sugar Monitoring  Nutrition Interventions   Nutrition Discussed/Reviewed Nutrition Discussed, Adding fruits and vegetables, Decreasing salt, Decreasing sugar intake  Pharmacy Interventions   Pharmacy Dicussed/Reviewed Pharmacy Topics Discussed, Medications and their functions  Safety Interventions   Safety Discussed/Reviewed Safety Discussed       Alessandra Grout [facility] Care Management Care Management Community Coordinator Direct Phone: [phone] Toll Free: [phone] Fax: [phone]

## 2022-08-10 NOTE — Chronic Care Management (AMB) (Signed)
   08/10/2022  LAQWANDA BITZ 26-Jan-1956 [id number]   Reason for Encounter: Patient is not currently enrolled in the CCM program. CCM status changed to previously enrolled  Alto Denver RN, MSN, CCM RN Care Manager  Chronic Care Management Direct Number: [phone]

## 2022-08-11 ENCOUNTER — Telehealth: Payer: Self-pay

## 2022-08-11 NOTE — Progress Notes (Signed)
Care Management & Coordination Services Pharmacy Team  Reason for Encounter: General adherence update   Contacted patient for general health update and medication adherence call.  Spoke with patient on 08/17/2022    What concerns do you have about your medications? Patient stated no  The patient denies side effects with their medications.   How often do you forget or accidentally miss a dose? Never  Do you use a pillbox? Yes  Are you having any problems getting your medications from your pharmacy? No  Has the cost of your medications been a concern? No  Since last visit with PharmD, no interventions have been made.   The patient has had an ED visit since last contact.   The patient denies problems with their health. Patient stated she will discuss starting back ozempic   Patient denies concerns or questions for Cherylin Mylar, PharmD at this time. Counciled patient on opiode use after surgery and was to prevent opioid   Counseled patient on: Leslie Davis taking medications   Chart Updates: Recent office visits:  08-10-2022 Leslie Sax, RN (CCM).  Recent consult visits:  None  Hospital visits:  Medication Reconciliation was completed by comparing discharge summary, patient's EMR and Pharmacy list, and upon discussion with patient.  Admitted to the hospital on 08-05-2022 due to Laparoscopic ASSISTED EXCISION OF MESENTERIC MASS procedure. Discharge date was 08-09-2022. Discharged from [facility].    New?Medications Started at [facility] Discharge:?? Humalog per sliding scale Tylenol 500 mg every 6 hours PRN Colace 100 mg twice daily Robaxin 500 mg every 8 hours PRN Miralax 17 g daily PRN Oxycodone 5 mg every 6 hours PRN  Medication Changes at Hospital Discharge: None  Medications Discontinued at Hospital Discharge: Ozempic  Medications that remain the same after Hospital Discharge:??  -All other medications will remain the same.     Medications: Outpatient Encounter Medications as of 08/11/2022  Medication Sig   acetaminophen (TYLENOL) 500 MG tablet Take 1 tablet (500 mg total) by mouth every 6 (six) hours as needed for mild pain.   amLODipine (NORVASC) 10 MG tablet TAKE ONE TABLET BY MOUTH ONCE DAILY   aspirin EC 81 MG tablet Take 1 tablet (81 mg total) by mouth daily. Swallow whole.   Blood Glucose Monitoring Suppl (ONETOUCH VERIO) w/Device KIT Use as directed to check blood sugars 2 times per day dx: e11.65   carvedilol (COREG) 3.125 MG tablet TAKE ONE TABLET BY MOUTH TWICE DAILY WITH A MEAL   celecoxib (CELEBREX) 200 MG capsule Take 1 capsule (200 mg total) by mouth 2 (two) times daily as needed.   cetirizine (ZYRTEC ALLERGY) 10 MG tablet Take 1 tablet (10 mg total) by mouth daily.   clindamycin (CLEOCIN T) 1 % lotion Apply 1 Application topically daily as needed (acne).   docusate sodium (COLACE) 100 MG capsule Take 1 capsule (100 mg total) by mouth 2 (two) times daily.   EDARBI 80 MG TABS TAKE ONE TABLET BY MOUTH ONCE DAILY   ezetimibe (ZETIA) 10 MG tablet Take 1 tablet (10 mg total) by mouth daily. (Patient not taking: Reported on 07/27/2022)   FARXIGA 10 MG TABS tablet TAKE ONE TABLET BY MOUTH ONCE DAILY BEFORE BREAKFAST   ferrous gluconate (FERGON) 324 MG tablet TAKE 1 TABLET BY MOUTH TWICE DAILY WITH A MEAL.   glucose blood (ONETOUCH VERIO) test strip USE TO check blood glucose TWICE DAILY AS DIRECTED   insulin lispro (HUMALOG) 100 UNIT/ML KwikPen Inject 0-6 Units into the skin 3 (  three) times daily with meals. Check Blood Glucose (BG) and inject per scale: BG <150= 0 unit; BG 150-200= 1 unit; BG 201-250= 2 unit; BG 251-300= 3 unit; BG 301-350= 4 unit; BG 351-400= 5 unit; BG >400= 6 unit and Call Primary Care.   Insulin Pen Needle (PEN NEEDLES) 31G X 5 MM MISC 1 each by Does not apply route 3 (three) times daily. May dispense any manufacturer covered by patient's insurance.   linaclotide ([name]) 145 MCG CAPS  capsule Take 1 capsule (145 mcg total) by mouth daily.   methocarbamol (ROBAXIN) 500 MG tablet Take 1 tablet (500 mg total) by mouth every 8 (eight) hours as needed for up to 7 days for muscle spasms.   nitroGLYCERIN (NITROSTAT) 0.4 MG SL tablet Place 1 tablet (0.4 mg total) under the tongue every 5 (five) minutes as needed for chest pain. If you require more than two tablets five minutes apart go to the nearest ER via EMS.   ondansetron (ZOFRAN) 4 MG tablet Take 1 tablet (4 mg total) by mouth every 8 (eight) hours as needed for nausea or vomiting.   OneTouch Delica Lancets 33G MISC Use as directed to check blood sugars 2 times per day dx:e11.65   oxyCODONE (OXY IR/ROXICODONE) 5 MG immediate release tablet Take 1 tablet (5 mg total) by mouth every 6 (six) hours as needed for severe pain.   pantoprazole (PROTONIX) 40 MG tablet TAKE ONE TABLET BY MOUTH TWICE DAILY   polyethylene glycol (MIRALAX / GLYCOLAX) 17 g packet Take 17 g by mouth daily as needed for mild constipation.   rosuvastatin (CRESTOR) 20 MG tablet TAKE ONE TABLET BY MOUTH ONCE DAILY   sucralfate (CARAFATE) 1 g tablet Take 1 tablet (1 g total) by mouth 4 (four) times daily -  with meals and at bedtime. (Patient not taking: Reported on 07/05/2022)   temazepam (RESTORIL) 30 MG capsule TAKE ONE CAPSULE BY MOUTH AT bedtime AS NEEDED   TRUEPLUS PEN NEEDLES 31G X 6 MM MISC USE WITH pen TO INJECT insulin DAILY   No facility-administered encounter medications on file as of 08/11/2022.    Recent vitals BP Readings from Last 3 Encounters:  08/09/22 (!) 175/53  07/28/22 (!) 162/60  07/05/22 (!) 140/78   Pulse Readings from Last 3 Encounters:  08/09/22 (!) 58  07/28/22 (!) 52  07/05/22 67   Wt Readings from Last 3 Encounters:  08/05/22 189 lb (85.7 kg)  07/28/22 186 lb 1.6 oz (84.4 kg)  07/05/22 182 lb 12.8 oz (82.9 kg)   BMI Readings from Last 3 Encounters:  08/05/22 34.57 kg/m  07/28/22 31.94 kg/m  07/05/22 33.43 kg/m     Recent lab results    Component Value Date/Time   NA 134 (L) 08/06/2022 0809   NA 140 07/05/2022 0946   K 4.1 08/06/2022 0809   CL 103 08/06/2022 0809   CO2 23 08/06/2022 0809   GLUCOSE 201 (H) 08/06/2022 0809   BUN 16 08/06/2022 0809   BUN 20 07/05/2022 0946   CREATININE 1.06 (H) 08/06/2022 0809   CREATININE 0.74 08/30/2011 1307   CALCIUM 8.5 (L) 08/06/2022 0809    Lab Results  Component Value Date   CREATININE 1.06 (H) 08/06/2022   GFR 47.43 (L) 12/23/2020   EGFR 54 (L) 07/05/2022   GFRNONAA 57 (L) 08/06/2022   GFRAA 57 (L) 02/21/2020   Lab Results  Component Value Date/Time   HGBA1C 6.8 (H) 07/05/2022 09:46 AM   HGBA1C 6.3 (H) 03/23/2022 10:32 AM  MICROALBUR 150 11/15/2019 10:20 AM    Lab Results  Component Value Date   CHOL 206 (H) 07/05/2022   HDL 109 07/05/2022   LDLCALC 87 07/05/2022   TRIG 52 07/05/2022   CHOLHDL 1.9 07/05/2022    Care Gaps: Annual wellness visit in last year? Yes Covid booster overdue   If Diabetic: Last eye exam / retinopathy screening:03-09-22 Last diabetic foot exam: None Last UACR: 12-02-2021  Star Rating Drugs:  Farxiga 10 mg- Last filled 08-09-2022 [facility]. Previous 07-14-2022 30 DS Edarbi 80 mg- Last filled 08-09-2022 30 [facility]. Previous 07-14-2022 30 DS Rosuvastatin 20 mg- Last filled 08-09-2022 30 [facility]. Previous 07-14-2022 30 DS  Malecca [facility] CMA Clinical Pharmacist Assistant [phone]

## 2022-08-11 NOTE — Telephone Encounter (Signed)
Recall entered for 6 month f/u

## 2022-08-11 NOTE — Telephone Encounter (Signed)
-----   Message from Lemar Lofty., MD sent at 08/11/2022  5:56 AM EDT ----- Regarding: Follow-up in clinic Leslie Davis, This patient should have a follow-up in clinic with me in 6 months.  Please place recall.  Thanks. GM

## 2022-08-11 NOTE — Discharge Summary (Signed)
Physician Discharge Summary   Patient ID: Leslie Davis [id number] 66 y.o. 1955-09-18  Admit date: 08/05/2022  Discharge date and time: 08/09/2022  4:17 PM   Admitting Physician: Fritzi Mandes, MD   Discharge Physician: Sophronia Simas, MD  Admission Diagnoses: Mesenteric mass [K63.89]  Discharge Diagnoses: Mesenteric mass, small bowel mass  Admission Condition: stable  Discharged Condition: stable  Indication for Admission: [name] is a 67 yo female who was referred for evaluation of a mesenteric mass, which was diagnosed incidentally on workup for abdominal pain. The mass was well-circumscribed and not amenable to endoscopic or percutaneous biopsy. After a discussion of the risks and benefits of surgery, she agreed to undergo resection.   Hospital Course: The patient was taken to the OR on 08/05/22 for a laparoscopic resection of a mesenteric mass and small bowel resection for an associated small bowel tumor. Please see separately dictated operative note for further details of this procedure. Postoperatively she was admitted to the med-surg floor in stable condition. She was started on a clear liquid diet, which she tolerated. On POD1 she was advanced to a regular diet. She began passing flatus on POD2. She had persistent hyperglycemia>300 in spite of being home her home [name], and the hospitalist service was consulted for assistance with insulin management. She was started on Semglee and sliding scale insulin. By POD4 she was ambulating, tolerating a regular diet, and had a bowel movement. Blood glucose control had improved. She was examined and deemed appropriate for discharge home. She will be discharged on sliding scale humalog per [facility] recommendations, and will follow up with her PCP for ongoing insulin management. She will follow up with me in the office in a few weeks.  Consults:  Internal medicine  Significant Diagnostic Studies: Surgical Pathology (pending at time of  discharge): A. MESENTERIC MASS, EXCISION:      One lymph node, positive for metastatic well-differentiated neuroendocrine tumor (0/1).      Metastatic focus: 4.2 cm in maximal dimension (large mesenteric mass).  B. SMALL BOWEL, RESECTION:      Well-differentiated neuroendocrine tumor, WHO grade 1.      Tumor size: 1.5 x 1.1 x 0.8 cm.      Tumor invades subserosa.      Lymphovascular invasion identified.      Perineural invasion identified.      Surgical margins of resection are negative for tumor.      Pathologic stage (AJCC 8th Edition): pT3 pN2      See oncology table.  ONCOLOGY TABLE:  JEJUNUM AND ILEUM NEUROENDOCRINE TUMOR: Resection  Procedure: Resection Tumor Site: Small intestine, not otherwise specified Tumor Size: 1.5 x 1.1 x 0.8 cm Tumor Focality: Unifocal Histologic Type and Grade: 1      Mitotic Rate: < 2 mitoses per 2 mm2      Ki-67 Labeling Index: < 3% Tumor Extension: Subserosa Lymphovascular Invasion: Identified Large Mesenteric Masses (>2 cm): Identified Margins: [ll margins negative for tumor Regional Lymph Nodes: 1      Number of Lymph Nodes with Tumor: 1      Number of Lymph Nodes Examined: 1 Distant metastasis:      Distant Site(s) Involved: Not applicable Pathologic Stage Classification (pTNM, AJCC 8th Edition): pT3, pN2   Treatments: analgesia: acetaminophen, Dilaudid, and oxycodone and surgery: Laparoscopic-assisted small bowel resection and excision of mesenteric mass  Discharge Exam: General: up in chair, NAD Neuro: alert and oriented, no focal deficits Resp: normal work of breathing on room air Abdomen:  soft, nondistended, nontender to palpation. Incisions clean and dry with no erythema or induration. Extremities: warm and well-perfused  Disposition: Discharge disposition: 01-Home or Self Care       Patient Instructions:  Allergies as of 08/09/2022       Reactions   Fiasp [insulin Aspart (w-niacinamide)] Hives        Medication  List     STOP taking these medications    benzonatate 100 MG capsule Commonly known as: Tessalon Perles   Ozempic (2 MG/DOSE) 8 MG/3ML Sopn Generic drug: Semaglutide (2 MG/DOSE)       TAKE these medications    acetaminophen 500 MG tablet Commonly known as: TYLENOL Take 1 tablet (500 mg total) by mouth every 6 (six) hours as needed for mild pain.   amLODipine 10 MG tablet Commonly known as: NORVASC TAKE ONE TABLET BY MOUTH ONCE DAILY   aspirin EC 81 MG tablet Take 1 tablet (81 mg total) by mouth daily. Swallow whole.   carvedilol 3.125 MG tablet Commonly known as: COREG TAKE ONE TABLET BY MOUTH TWICE DAILY WITH A MEAL   celecoxib 200 MG capsule Commonly known as: CELEBREX Take 1 capsule (200 mg total) by mouth 2 (two) times daily as needed.   cetirizine 10 MG tablet Commonly known as: ZyrTEC Allergy Take 1 tablet (10 mg total) by mouth daily.   clindamycin 1 % lotion Commonly known as: CLEOCIN T Apply 1 Application topically daily as needed (acne).   docusate sodium 100 MG capsule Commonly known as: COLACE Take 1 capsule (100 mg total) by mouth 2 (two) times daily.   Edarbi 80 MG Tabs Generic drug: Azilsartan Medoxomil TAKE ONE TABLET BY MOUTH ONCE DAILY   ezetimibe 10 MG tablet Commonly known as: ZETIA Take 1 tablet (10 mg total) by mouth daily.   Farxiga 10 MG Tabs tablet Generic drug: dapagliflozin propanediol TAKE ONE TABLET BY MOUTH ONCE DAILY BEFORE BREAKFAST   ferrous gluconate 324 MG tablet Commonly known as: FERGON TAKE 1 TABLET BY MOUTH TWICE DAILY WITH A MEAL.   insulin lispro 100 UNIT/ML KwikPen Commonly known as: HUMALOG Inject 0-6 Units into the skin 3 (three) times daily with meals. Check Blood Glucose (BG) and inject per scale: BG <150= 0 unit; BG 150-200= 1 unit; BG 201-250= 2 unit; BG 251-300= 3 unit; BG 301-350= 4 unit; BG 351-400= 5 unit; BG >400= 6 unit and Call Primary Care.   linaclotide 145 MCG Caps capsule Commonly known as:  [name] Take 1 capsule (145 mcg total) by mouth daily.   methocarbamol 500 MG tablet Commonly known as: ROBAXIN Take 1 tablet (500 mg total) by mouth every 8 (eight) hours as needed for up to 7 days for muscle spasms.   nitroGLYCERIN 0.4 MG SL tablet Commonly known as: Nitrostat Place 1 tablet (0.4 mg total) under the tongue every 5 (five) minutes as needed for chest pain. If you require more than two tablets five minutes apart go to the nearest ER via EMS.   ondansetron 4 MG tablet Commonly known as: Zofran Take 1 tablet (4 mg total) by mouth every 8 (eight) hours as needed for nausea or vomiting.   OneTouch Delica Lancets 33G Misc Use as directed to check blood sugars 2 times per day dx:e11.65   OneTouch Verio test strip Generic drug: glucose blood USE TO check blood glucose TWICE DAILY AS DIRECTED   OneTouch Verio w/Device Kit Use as directed to check blood sugars 2 times per day dx: e11.65  oxyCODONE 5 MG immediate release tablet Commonly known as: Oxy IR/ROXICODONE Take 1 tablet (5 mg total) by mouth every 6 (six) hours as needed for severe pain.   pantoprazole 40 MG tablet Commonly known as: PROTONIX TAKE ONE TABLET BY MOUTH TWICE DAILY   polyethylene glycol 17 g packet Commonly known as: MIRALAX / GLYCOLAX Take 17 g by mouth daily as needed for mild constipation.   rosuvastatin 20 MG tablet Commonly known as: CRESTOR TAKE ONE TABLET BY MOUTH ONCE DAILY   sucralfate 1 g tablet Commonly known as: Carafate Take 1 tablet (1 g total) by mouth 4 (four) times daily -  with meals and at bedtime.   temazepam 30 MG capsule Commonly known as: RESTORIL TAKE ONE CAPSULE BY MOUTH AT bedtime AS NEEDED   TRUEplus Pen Needles 31G X 6 MM Misc Generic drug: Insulin Pen Needle USE WITH pen TO INJECT insulin DAILY What changed: Another medication with the same name was added. Make sure you understand how and when to take each.   Pen Needles 31G X 5 MM Misc 1 each by Does not  apply route 3 (three) times daily. May dispense any manufacturer covered by patient's insurance. What changed: You were already taking a medication with the same name, and this prescription was added. Make sure you understand how and when to take each.       Activity: no driving while on analgesics and no heavy lifting for 6 weeks Diet: diabetic diet Wound Care: keep wound clean and dry  Follow-up with Dr. Freida Busman on 09/02/22.  Signed: Fritzi Mandes 08/11/2022 11:30 AM

## 2022-08-18 ENCOUNTER — Other Ambulatory Visit: Payer: Self-pay

## 2022-08-18 NOTE — Progress Notes (Signed)
The proposed treatment discussed in conference is for discussion purpose only and is not a binding recommendation.  The patients have not been physically examined, or presented with their treatment options.  Therefore, final treatment plans cannot be decided.  

## 2022-08-19 ENCOUNTER — Ambulatory Visit (INDEPENDENT_AMBULATORY_CARE_PROVIDER_SITE_OTHER): Payer: 59 | Admitting: Orthopaedic Surgery

## 2022-08-19 DIAGNOSIS — M79652 Pain in left thigh: Secondary | ICD-10-CM | POA: Insufficient documentation

## 2022-08-19 NOTE — Progress Notes (Signed)
Office Visit Note   Patient: Leslie Davis           Date of Birth: 07-17-1955           MRN: [id number] Visit Date: 08/19/2022              Requested by: Dorothyann Peng, MD [address] [place],  [place] [address] PCP: Dorothyann Peng, MD   Assessment & Plan: Visit Diagnoses:  1. Left thigh pain     Plan: Impression is 67 year old female with left thigh pain.  Seems to be muscular in nature and not coming from her hip, knee, back.  Recommend over-the-counter rubs and heat and continue to take Tylenol.  She should follow-up with [place] in about 6 weeks if symptoms do not improve.  Follow-Up Instructions: No follow-ups on file.   Orders:  No orders of the defined types were placed in this encounter.  No orders of the defined types were placed in this encounter.     Procedures: No procedures performed   Clinical Data: No additional findings.   Subjective: Chief Complaint  Patient presents with   Left Leg - Pain    HPI Patient comes in today for evaluation of left thigh pain that started last week.  Had stomach surgery a week ago.  Denies any back pain.  Has some groin pain but she feels that this is not related.  Feels some needle sensation in the thigh. Review of Systems  Constitutional: Negative.   HENT: Negative.    Eyes: Negative.   Respiratory: Negative.    Cardiovascular: Negative.   Endocrine: Negative.   Musculoskeletal: Negative.   Neurological: Negative.   Hematological: Negative.   Psychiatric/Behavioral: Negative.    All other systems reviewed and are negative.    Objective: Vital Signs: There were no vitals taken for this visit.  Physical Exam Vitals and nursing note reviewed.  Constitutional:      Appearance: She is well-developed.  HENT:     Head: Normocephalic and atraumatic.  Pulmonary:     Effort: Pulmonary effort is normal.  Abdominal:     Palpations: Abdomen is soft.  Musculoskeletal:     Cervical back: Neck  supple.  Skin:    General: Skin is warm.     Capillary Refill: Capillary refill takes less than 2 seconds.  Neurological:     Mental Status: She is alert and oriented to person, place, and time.  Psychiatric:        Behavior: Behavior normal.        Thought Content: Thought content normal.        Judgment: Judgment normal.     Ortho Exam Examination of the left hip knee and lumbar spine are unrevealing.  She has tenderness to the lateral portion of the quadriceps.  She has full motor and sensory function.  No sciatic tension signs. Specialty Comments:  No specialty comments available.  Imaging: No results found.   PMFS History: Patient Active Problem List   Diagnosis Date Noted   Left thigh pain 08/19/2022   Mesenteric mass 08/05/2022   Small bowel mass 08/05/2022   Acute cough 05/02/2022   Nasal congestion 05/02/2022   Type 2 diabetes mellitus with obesity (HCC) 03/24/2022   Generalized obesity 03/24/2022   Atherosclerosis of aorta (HCC) 03/23/2022   Coronary artery disease of native artery of native heart with stable angina pectoris (HCC)    Other constipation 05/25/2021   Status post total left knee replacement 10/27/2020  Primary osteoarthritis of left knee    NAFLD (nonalcoholic fatty liver disease) 16/12/9602   Other fatigue 07/23/2020   SOBOE (shortness of breath on exertion) 07/23/2020   Diabetes mellitus (HCC) 07/23/2020   Hypertension associated with type 2 diabetes mellitus (HCC) 07/23/2020   Hyperlipidemia associated with type 2 diabetes mellitus (HCC) 07/23/2020   Absolute anemia 07/23/2020   Vitamin D deficiency 07/23/2020   At risk for heart disease 07/23/2020   AKI (acute kidney injury) (HCC) 09/07/2019   Acute kidney injury (HCC) 09/06/2019   Bradycardia 09/06/2019   Hyperglycemia 09/06/2019   HLD (hyperlipidemia)    Hypertensive nephropathy 05/16/2019   Erosive gastritis 11/16/2017   Fatty liver 11/16/2017   Abdominal pain 12/11/2012   Early  satiety 12/11/2012   Hiatal hernia    Hx of cholecystectomy 02/05/2011   Flu-like symptoms 02/05/2011   CONSTIPATION 08/27/2008   FATTY LIVER DISEASE 05/07/2008   Nausea with vomiting 04/23/2008   EPIGASTRIC PAIN 04/23/2008   RENAL CALCULUS 04/22/2008   Type 2 diabetes mellitus with stage 2 chronic kidney disease, with long-term current use of insulin (HCC) 05/19/2006   Type 2 diabetes mellitus with hyperlipidemia (HCC) 05/19/2006   HYPERTENSION, BENIGN SYSTEMIC 05/19/2006   GASTROESOPHAGEAL REFLUX, NO ESOPHAGITIS 05/19/2006   AMENORRHEA 05/19/2006   Past Medical History:  Diagnosis Date   CKD (chronic kidney disease)    stage 3   Coronary artery disease 07/2021   obstructive CAD-70% LAD-treated medically   DM (diabetes mellitus) (HCC)    Fatty liver disease, nonalcoholic    Gastritis    Gastroparesis    GERD (gastroesophageal reflux disease)    Heartburn    Hiatal hernia    HLD (hyperlipidemia)    HTN (hypertension)    Joint pain    Renal calculus    Rheumatoid arthritis (HCC)    Vitamin D deficiency     Family History  Problem Relation Age of Onset   Diabetes Mother    Thyroid disease Mother    Kidney disease Mother    Hypertension Father    Hyperlipidemia Father    Sudden death Father    Stroke Father    Diabetes Sister    Heart disease Sister    Kidney disease Sister    COPD Sister    Heart disease Brother 63   Diabetes Brother    Stomach cancer Maternal Aunt        X 2 aunts   Diabetes Maternal Grandmother    Colon cancer Neg Hx    Esophageal cancer Neg Hx    Rectal cancer Neg Hx    Colon polyps Neg Hx     Past Surgical History:  Procedure Laterality Date   BREAST BIOPSY  02/04/2022   MM LT RADIOACTIVE SEED EA ADD LESION LOC MAMMO GUIDE 02/04/2022 [facility]   BREAST BIOPSY  02/04/2022   MM LT RADIOACTIVE SEED LOC MAMMO GUIDE 02/04/2022 [facility]   BREAST LUMPECTOMY WITH RADIOACTIVE SEED LOCALIZATION Left 02/05/2022   Procedure:  LEFT BREAST BRACKETED LUMPECTOMY WITH RADIOACTIVE SEED LOCALIZATION;  Surgeon: Griselda Miner, MD;  Location: [facility];  Service: General;  Laterality: Left;   CHOLECYSTECTOMY     COLONOSCOPY  09/10/2011   normal   JOINT REPLACEMENT  2022   left TKA   LAPAROSCOPIC REMOVAL ABDOMINAL MASS N/A 08/05/2022   Procedure: LAPAROSCOPIC ASSISTED EXCISION OF MESENTERIC MASS;  Surgeon: Fritzi Mandes, MD;  Location: [facility] OR;  Service: General;  Laterality: N/A;   LAPAROSCOPIC SMALL  BOWEL RESECTION N/A 08/05/2022   Procedure: LAPAROSCOPIC SMALL BOWEL RESECTION;  Surgeon: Fritzi Mandes, MD;  Location: [facility] OR;  Service: General;  Laterality: N/A;   LEFT HEART CATH AND CORONARY ANGIOGRAPHY N/A 08/18/2021   Procedure: LEFT HEART CATH AND CORONARY ANGIOGRAPHY;  Surgeon: Elder Negus, MD;  Location: [facility];  Service: Cardiovascular;  Laterality: N/A;   TOTAL KNEE ARTHROPLASTY Left 10/27/2020   Procedure: LEFT TOTAL KNEE ARTHROPLASTY;  Surgeon: Tarry Kos, MD;  Location: [facility] OR;  Service: Orthopedics;  Laterality: Left;   TUBAL LIGATION     UPPER GASTROINTESTINAL ENDOSCOPY     Social History   Occupational History   Occupation: Housekeeping    Comment: Education officer, environmental  Tobacco Use   Smoking status: Former    Packs/day: 0.25    Years: 1.00    Additional pack years: 0.00    Total pack years: 0.25    Types: Cigarettes    Quit date: 09/10/1974    Years since quitting: 47.9   Smokeless tobacco: Never   Tobacco comments:    she no longer smokes.   Vaping Use   Vaping Use: Never used  Substance and Sexual Activity   Alcohol use: Never   Drug use: Never   Sexual activity: Not Currently    Birth control/protection: Post-menopausal

## 2022-08-23 ENCOUNTER — Other Ambulatory Visit: Payer: Self-pay

## 2022-08-23 NOTE — Progress Notes (Signed)
   Leslie Davis 09/29/1955 [id number]  Patient outreached by Seward Meth , PharmD Candidate on 08/23/22.  Blood Pressure Readings: Last documented ambulatory systolic blood pressure: 175 Last documented ambulatory diastolic blood pressure: 53 Does the patient have a validated home blood pressure machine?: Yes They report home readings 135/80  Medication review was performed. Is the patient taking their medications as prescribed?: Yes   The following barriers to adherence were noted: Does the patient have cost concerns?: No Does the patient have transportation concerns?: No Does the patient need assistance obtaining refills?: No Does the patient occassionally forget to take some of their prescribed medications?: No Does the patient feel like one/some of their medications make them feel poorly?: No Does the patient have questions or concerns about their medications?: No Does the patient have a follow up scheduled with their primary care provider/cardiologist?: Yes   Interventions: Interventions Completed: Medications were reviewed, Patient was educated on goal blood pressures and long term health implications of elevated blood pressure, Patient was counseled on lifestyle modifications to improve blood pressure, including, Patient was educated on proper technique to check home blood pressure and reminded to bring home machine and readings to next provider appointment  The patient has follow up scheduled:  PCP: Dorothyann Peng, MD   Seward Meth, Student-PharmD

## 2022-08-24 ENCOUNTER — Ambulatory Visit (INDEPENDENT_AMBULATORY_CARE_PROVIDER_SITE_OTHER): Payer: 59 | Admitting: Internal Medicine

## 2022-08-24 ENCOUNTER — Encounter: Payer: Self-pay | Admitting: Internal Medicine

## 2022-08-24 VITALS — BP 130/60 | HR 50 | Temp 97.7°F | Ht 62.0 in | Wt 186.4 lb

## 2022-08-24 DIAGNOSIS — K6389 Other specified diseases of intestine: Secondary | ICD-10-CM

## 2022-08-24 DIAGNOSIS — Z6834 Body mass index (BMI) 34.0-34.9, adult: Secondary | ICD-10-CM

## 2022-08-24 DIAGNOSIS — Z794 Long term (current) use of insulin: Secondary | ICD-10-CM | POA: Diagnosis not present

## 2022-08-24 DIAGNOSIS — D3A8 Other benign neuroendocrine tumors: Secondary | ICD-10-CM

## 2022-08-24 DIAGNOSIS — I700 Atherosclerosis of aorta: Secondary | ICD-10-CM | POA: Diagnosis not present

## 2022-08-24 DIAGNOSIS — E1159 Type 2 diabetes mellitus with other circulatory complications: Secondary | ICD-10-CM

## 2022-08-24 DIAGNOSIS — E6609 Other obesity due to excess calories: Secondary | ICD-10-CM

## 2022-08-24 LAB — BMP8+EGFR
BUN/Creatinine Ratio: 24 (ref 12–28)
BUN: 29 mg/dL — ABNORMAL HIGH (ref 8–27)
CO2: 22 mmol/L (ref 20–29)
Calcium: 9.8 mg/dL (ref 8.7–10.3)
Chloride: 104 mmol/L (ref 96–106)
Creatinine, Ser: 1.19 mg/dL — ABNORMAL HIGH (ref 0.57–1.00)
Glucose: 233 mg/dL — ABNORMAL HIGH (ref 70–99)
Potassium: 4.9 mmol/L (ref 3.5–5.2)
Sodium: 140 mmol/L (ref 134–144)
eGFR: 50 mL/min/{1.73_m2} — ABNORMAL LOW (ref >59)

## 2022-08-24 MED ORDER — SEMAGLUTIDE(0.25 OR 0.5MG/DOS) 2 MG/3ML ~~LOC~~ SOPN: PEN_INJECTOR | SUBCUTANEOUS | 1 refills | Status: DC

## 2022-08-24 NOTE — Progress Notes (Unsigned)
I,[name],acting as a scribe for Gwynneth Aliment, MD.,have documented all relevant documentation on the behalf of Gwynneth Aliment, MD,as directed by  Gwynneth Aliment, MD while in the presence of Gwynneth Aliment, MD.    Subjective:     Patient ID: Leslie Davis , female    DOB: 1955/12/04 , 67 y.o.   MRN: [id number]   Chief Complaint  Patient presents with   Diabetes    HPI  Patient presents today for hospital follow up. She reports feeling okay. She reports she has no specific questions or concerns.  Patient was admitted to [facility] on 5/16 & discharged on 5/20.   She is a 67 y.o. female who was referred to General Surgery for evaluation of a mesenteric mass. She has a history of GERD and peptic ulcers, for which she is followed by GI (Dr. Meridee Score) and underwent an EGD on 04/07/22. Biopsies of her gastric ulcers at that time were benign and negative for H pylori. She was treated with a PPI and carafate but has continued to have abdominal pain, so a CT scan was completed for further workup. This showed a 3cm mass in the small bowel mesentery, suspicious for a GIST vs carcinoid vs desmoid tumor. She was referred to discuss surgical resection.    Hospital course: On 08/05/22, she had a laparoscopic resection of a mesenteric mass and small bowel resection for an associated small bowel tumor. Postoperatively, she was admitted to the med-surg floor in stable condition. She was started on a clear liquid diet, and then advanced to a regular diet on post-op day 1. She began passing flatus on POD2. She had persistent hyperglycemia>300 in spite of being on her regular medication Farxiga. The hospitalist service was consulted for assistance with insulin management. She was started on Semglee and sliding scale insulin. By POD4 she was ambulating, tolerating a regular diet, and had a bowel movement. Blood glucose control had improved. She was examined and deemed appropriate for discharge  home. She wias discharged on sliding scale Humalog per TR[facility] recommendations . She was advised to follow up with myself for ongoing insulin management.   Since discharge, she has felt well. Denies having any significant discomfort. She would like to resume Ozempic. Initially, she thought it caused her abdominal pain. However, since diagnosis of mesenteric mass, she thinks this was likely causing her pain. She adds her blood sugar have improved the past several days as well.      Past Medical History:  Diagnosis Date   CKD (chronic kidney disease)    stage 3   Coronary artery disease 07/2021   obstructive CAD-70% LAD-treated medically   DM (diabetes mellitus) (HCC)    Fatty liver disease, nonalcoholic    Gastritis    Gastroparesis    GERD (gastroesophageal reflux disease)    Heartburn    Hiatal hernia    HLD (hyperlipidemia)    HTN (hypertension)    Joint pain    Renal calculus    Rheumatoid arthritis (HCC)    Vitamin D deficiency      Family History  Problem Relation Age of Onset   Diabetes Mother    Thyroid disease Mother    Kidney disease Mother    Hypertension Father    Hyperlipidemia Father    Sudden death Father    Stroke Father    Diabetes Sister    Heart disease Sister    Kidney disease Sister    COPD Sister  Heart disease Brother 33   Diabetes Brother    Stomach cancer Maternal Aunt        X 2 aunts   Diabetes Maternal Grandmother    Colon cancer Neg Hx    Esophageal cancer Neg Hx    Rectal cancer Neg Hx    Colon polyps Neg Hx      Current Outpatient Medications:    acetaminophen (TYLENOL) 500 MG tablet, Take 1 tablet (500 mg total) by mouth every 6 (six) hours as needed for mild pain., Disp: 30 tablet, Rfl: 0   amLODipine (NORVASC) 10 MG tablet, TAKE ONE TABLET BY MOUTH ONCE DAILY, Disp: 90 tablet, Rfl: 3   aspirin EC 81 MG tablet, Take 1 tablet (81 mg total) by mouth daily. Swallow whole., Disp: 150 tablet, Rfl: 2   Blood Glucose Monitoring Suppl  (ONETOUCH VERIO) w/Device KIT, Use as directed to check blood sugars 2 times per day dx: e11.65, Disp: 1 kit, Rfl: 1   carvedilol (COREG) 3.125 MG tablet, TAKE ONE TABLET BY MOUTH TWICE DAILY WITH A MEAL, Disp: 180 tablet, Rfl: 1   celecoxib (CELEBREX) 200 MG capsule, Take 1 capsule (200 mg total) by mouth 2 (two) times daily as needed., Disp: 180 capsule, Rfl: 2   cetirizine (ZYRTEC ALLERGY) 10 MG tablet, Take 1 tablet (10 mg total) by mouth daily., Disp: 30 tablet, Rfl: 2   clindamycin (CLEOCIN T) 1 % lotion, Apply 1 Application topically daily as needed (acne)., Disp: , Rfl:    docusate sodium (COLACE) 100 MG capsule, Take 1 capsule (100 mg total) by mouth 2 (two) times daily., Disp: 10 capsule, Rfl: 0   EDARBI 80 MG TABS, TAKE ONE TABLET BY MOUTH ONCE DAILY, Disp: 90 tablet, Rfl: 2   ezetimibe (ZETIA) 10 MG tablet, Take 1 tablet (10 mg total) by mouth daily., Disp: 90 tablet, Rfl: 3   FARXIGA 10 MG TABS tablet, TAKE ONE TABLET BY MOUTH ONCE DAILY BEFORE BREAKFAST, Disp: 90 tablet, Rfl: 2   ferrous gluconate (FERGON) 324 MG tablet, TAKE 1 TABLET BY MOUTH TWICE DAILY WITH A MEAL., Disp: 100 tablet, Rfl: 0   glucose blood (ONETOUCH VERIO) test strip, USE TO check blood glucose TWICE DAILY AS DIRECTED, Disp: 150 strip, Rfl: 11   insulin lispro (HUMALOG) 100 UNIT/ML KwikPen, Inject 0-6 Units into the skin 3 (three) times daily with meals. Check Blood Glucose (BG) and inject per scale: BG <150= 0 unit; BG 150-200= 1 unit; BG 201-250= 2 unit; BG 251-300= 3 unit; BG 301-350= 4 unit; BG 351-400= 5 unit; BG >400= 6 unit and Call Primary Care., Disp: 15 mL, Rfl: 0   Insulin Pen Needle (PEN NEEDLES) 31G X 5 MM MISC, 1 each by Does not apply route 3 (three) times daily. May dispense any manufacturer covered by patient's insurance., Disp: 100 each, Rfl: 0   linaclotide ([name]) 145 MCG CAPS capsule, Take 1 capsule (145 mcg total) by mouth daily., Disp: 30 capsule, Rfl: 0   OneTouch Delica Lancets 33G MISC, Use  as directed to check blood sugars 2 times per day dx:e11.65, Disp: 150 each, Rfl: 3   oxyCODONE (OXY IR/ROXICODONE) 5 MG immediate release tablet, Take 1 tablet (5 mg total) by mouth every 6 (six) hours as needed for severe pain., Disp: 20 tablet, Rfl: 0   pantoprazole (PROTONIX) 40 MG tablet, TAKE ONE TABLET BY MOUTH TWICE DAILY, Disp: 60 tablet, Rfl: 3   polyethylene glycol (MIRALAX / GLYCOLAX) 17 g packet, Take 17 g by  mouth daily as needed for mild constipation., Disp: 14 each, Rfl: 0   rosuvastatin (CRESTOR) 20 MG tablet, TAKE ONE TABLET BY MOUTH ONCE DAILY, Disp: 90 tablet, Rfl: 2   Semaglutide,0.25 or 0.5MG /DOS, 2 MG/3ML SOPN, Inject 0.25mg  sq weekly, Disp: 3 mL, Rfl: 1   temazepam (RESTORIL) 30 MG capsule, TAKE ONE CAPSULE BY MOUTH AT bedtime AS NEEDED, Disp: 30 capsule, Rfl: 3   TRUEPLUS PEN NEEDLES 31G X 6 MM MISC, USE WITH pen TO INJECT insulin DAILY, Disp: 150 each, Rfl: 3   nitroGLYCERIN (NITROSTAT) 0.4 MG SL tablet, Place 1 tablet (0.4 mg total) under the tongue every 5 (five) minutes as needed for chest pain. If you require more than two tablets five minutes apart go to the nearest ER via EMS., Disp: 30 tablet, Rfl: 0   ondansetron (ZOFRAN) 4 MG tablet, Take 1 tablet (4 mg total) by mouth every 8 (eight) hours as needed for nausea or vomiting. (Patient not taking: Reported on 08/23/2022), Disp: 20 tablet, Rfl: 0   sucralfate (CARAFATE) 1 g tablet, Take 1 tablet (1 g total) by mouth 4 (four) times daily -  with meals and at bedtime. (Patient not taking: Reported on 08/24/2022), Disp: 60 tablet, Rfl: 4   Allergies  Allergen Reactions   Fiasp [Insulin Aspart (W-Niacinamide)] Hives     Review of Systems  Constitutional: Negative.   Respiratory: Negative.    Cardiovascular: Negative.   Gastrointestinal: Negative.   Neurological: Negative.   Psychiatric/Behavioral: Negative.       Today's Vitals   08/24/22 0921  BP: (!) 142/70  Pulse: (!) 50  Temp: 97.7 F (36.5 C)  SpO2: 98%   Weight: 186 lb 6.4 oz (84.6 kg)  Height: 5\' 2"  (1.575 m)   Wt Readings from Last 3 Encounters:  08/24/22 186 lb 6.4 oz (84.6 kg)  08/05/22 189 lb (85.7 kg)  07/28/22 186 lb 1.6 oz (84.4 kg)    Body mass index is 34.09 kg/m.  The ASCVD Risk score (Arnett DK, et al., 2019) failed to calculate for the following reasons:   The valid HDL cholesterol range is 20 to 100 mg/dL ++ Objective:  Physical Exam Vitals and nursing note reviewed.  Constitutional:      Appearance: Normal appearance.  HENT:     Head: Normocephalic and atraumatic.  Eyes:     Extraocular Movements: Extraocular movements intact.  Cardiovascular:     Rate and Rhythm: Normal rate and regular rhythm.     Heart sounds: Normal heart sounds.  Pulmonary:     Effort: Pulmonary effort is normal.     Breath sounds: Normal breath sounds.  Abdominal:     General: Bowel sounds are normal.     Palpations: Abdomen is soft.  Skin:    General: Skin is warm.  Neurological:     General: No focal deficit present.     Mental Status: She is alert.  Psychiatric:        Mood and Affect: Mood normal.        Behavior: Behavior normal.         Assessment And Plan:     1. Mesenteric mass  2. Neuroendocrine tumor  3. Type 2 diabetes mellitus with other circulatory complication, with long-term current use of insulin (HCC) - BMP8+EGFR  4. Class 1 obesity due to excess calories with serious comorbidity and body mass index (BMI) of 34.0 to 34.9 in adult - BMP8+EGFR   Return in 6 weeks (on 10/05/2022), or f.u  ozempic.  Patient was given opportunity to ask questions. Patient verbalized understanding of the plan and was able to repeat key elements of the plan. All questions were answered to their satisfaction.   I, Gwynneth Aliment, MD, have reviewed all documentation for this visit. The documentation on 08/24/22 for the exam, diagnosis, procedures, and orders are all accurate and complete.   IF YOU HAVE BEEN REFERRED TO A  SPECIALIST, IT MAY TAKE 1-2 WEEKS TO SCHEDULE/PROCESS THE REFERRAL. IF YOU HAVE NOT HEARD FROM US/SPECIALIST IN TWO WEEKS, PLEASE GIVE [place] A CALL AT [phone].   THE PATIENT IS ENCOURAGED TO PRACTICE SOCIAL DISTANCING DUE TO THE COVID-19 PANDEMIC.

## 2022-08-24 NOTE — Patient Instructions (Addendum)
Carcinoid Tumors A carcinoid tumor is a rare type of cancer that usually grows very slowly. These tumors can grow in any area of the body where there are cells that receive signals from the nervous system (neuroendocrine cells). Carcinoid tumors are also called neuroendocrine tumors. Some of these tumors may secrete chemical messengers (hormones). The small intestine is the most common place for carcinoid tumors to grow. They may also grow in other areas of the digestive system or in the lungs. There are three types of carcinoid tumors: Slow-growing tumors are the most common type. This type of tumor may not cause symptoms for many years. Faster-growing carcinoid tumors may spread to other areas of the body, including the liver. This type causes symptoms sooner. Hormone-secreting tumors cause symptoms because of the hormones they secrete. These symptoms are called carcinoid syndrome. What are the causes? Cancer results when healthy cells change and start to grow out of control. These cells form tumors. Exactly why this happens is not known. What increases the risk? You are more likely to develop this condition if: You are 69-74 years old. You have a family history of a condition called multiple endocrine neoplasia type 1 (MEN 1). You have a stomach condition that reduces the production of stomach acid, such as pernicious anemia, atrophic gastritis, or Zollinger-Ellison syndrome. What are the signs or symptoms? Slow-growing carcinoid tumors do not cause symptoms for many years. When symptoms do develop, they depend on the location of the tumor and whether the tumor secretes hormones that cause carcinoid syndrome. In some cases, the symptoms of carcinoid syndrome can be triggered by stress, exercise, alcohol (especially red wine), and some foods (especially cheese and chocolate). Symptoms of carcinoid syndrome may include: Flushing of the face and neck. Recurrent diarrhea. Shortness of breath or  making high-pitched whistling sounds when you breathe, most often when you breathe out (wheezing). Fast or irregular heartbeats (palpitations). Swelling of the feet and ankles. Fainting. Symptoms of digestive system carcinoids may include: Abdominal pain. Diarrhea or constipation. Inability to have a bowel movement. Nausea or vomiting. Rectal bleeding. Symptoms of lung carcinoids may include: Chest pain. Shortness of breath. Cough, sometimes with blood. How is this diagnosed? Carcinoid tumors that do not cause symptoms may be found during imaging tests or procedures done to diagnose other conditions. If your health care provider suspects a carcinoid tumor, you may have tests to confirm the diagnosis. These may include: A 24-hour urine sample. Blood tests. A type of imaging scan that is done after you get an injection of a radioactive dye that will go to a carcinoid tumor (octreotide scan). Other imaging tests, such as an X-ray, MRI, PET scan, or CT scan. A procedure to remove a sample of the tumor (biopsy) for testing. How is this treated? Treatment depends on the size and location of the tumor. For a small tumor that has not spread to other areas of the body, surgery can be done to remove the tumor. This is the most common treatment and usually cures this type of cancer. For tumors that are too big to remove or have spread to other parts of the body, treatment options may include: Surgically removing part of the tumor to make other treatments more effective. Taking medicines such as: A medicine made from a naturally occurring hormone (octreotide). Injections of octreotide may reduce symptoms and reverse tumor growth. A medicine that boosts the immune system (interferon). This medicine may be used to increase natural resistance to cancer. Several types of  cancer drugs (chemotherapy). These may be given through an IV or taken by mouth in various combinations. Targeted therapy. This  involves using drugs that target specific parts of cancer cells and the area around them to block the growth and spread of the cancer. Using high-energy rays to kill cancer cells (radiation therapy). This may be used for some carcinoid tumors. For carcinoid cancer that has spread to the liver, treatment may include having a procedure that involves using radio waves that destroy cancer, either with heat (radiofrequency ablation) or with freezing (cryoablation). Pellets may be injected to block blood supply to the tumor (embolization). Follow these instructions at home:     Take over-the-counter and prescription medicines only as told by your health care provider. Follow diet instructions from your health care provider. This may include adding more protein to your diet. You may also need to avoid any foods that trigger your symptoms. Do not drink alcohol, especially red wine. Do not use any products that contain nicotine or tobacco. These products include cigarettes, chewing tobacco, and vaping devices, such as e-cigarettes. If you need help quitting, ask your health care provider. Take actions to manage stress, such as doing meditation or deep breathing techniques. Keep all follow-up visits. This is important. Where to find more information National Cancer Institute: www.cancer.gov Contact a health care provider if you: Develop any symptoms of carcinoid syndrome. Develop any new symptoms. Get help right away if you have: Chest pain. Trouble breathing. These symptoms may be an emergency. Get help right away. Call 911. Do not wait to see if the symptoms will go away. Do not drive yourself to the hospital. Summary A carcinoid tumor is a rare type of cancer that usually grows very slowly. Slow-growing carcinoid tumors do not cause symptoms for many years. When symptoms do occur, they depend on the location of the tumor and whether the tumor secretes hormones that cause carcinoid  syndrome. Carcinoid syndrome may cause flushing, wheezing, palpitations, diarrhea, swelling, and fainting. You may need blood tests, imaging tests, and a biopsy to confirm a diagnosis of carcinoid tumors. Surgery is the best treatment for a carcinoid tumor when it is still small. There are several other treatment options for tumors that are large or have spread to other areas of the body. This information is not intended to replace advice given to you by your health care provider. Make sure you discuss any questions you have with your health care provider. Document Revised: 10/28/2020 Document Reviewed: 10/28/2020 Elsevier Patient Education  2024 [employer] Inc.   Type 2 Diabetes Mellitus, Diagnosis, Adult Type 2 diabetes (type 2 diabetes mellitus) is a long-term (chronic) disease. It may happen when there is one or both of these problems: The pancreas does not make enough insulin. The body does not react in a normal way to insulin that it makes. Insulin lets sugars go into cells in your body. If you have type 2 diabetes, sugars cannot get into your cells. Sugars build up in the blood. This causes high blood sugar. What are the causes? The exact cause of this condition is not known. What increases the risk? Having type 2 diabetes in your family. Being overweight or very overweight. Not being active. Your body not reacting in a normal way to the insulin it makes. Having higher than normal blood sugar over time. Having a type of diabetes when you were pregnant. Having a condition that causes small fluid-filled sacs on your ovaries. What are the signs or symptoms? At  first, you may have no symptoms. You will get symptoms slowly. They may include: More thirst than normal. More hunger than normal. Needing to pee more than normal. Losing weight without trying. Feeling tired. Feeling weak. Seeing things blurry. Dark patches on your skin. How is this treated? This condition may be treated  by a diabetes expert. You may need to: Follow an eating plan made by a food expert (dietitian). Get regular exercise. Find ways to deal with stress. Check blood sugar as often as told. Take medicines. Your doctor will set treatment goals for you. Your blood sugar should be at these levels: Before meals: 80-130 mg/dL (4.0-1.0 mmol/L). After meals: below 180 mg/dL (10 mmol/L). Over the last 2-3 months: less than 7%. Follow these instructions at home: Medicines Take your diabetes medicines or insulin every day. Take medicines as told to help you prevent other problems caused by this condition. You may need: Aspirin. Medicine to lower cholesterol. Medicine to control blood pressure. Questions to ask your doctor Should I meet with a diabetes educator? What medicines do I need, and when should I take them? What will I need to treat my condition at home? When should I check my blood sugar? Where can I find a support group? Who can I call if I have questions? When is my next doctor visit? General instructions Take over-the-counter and prescription medicines only as told by your doctor. Keep all follow-up visits. Where to find more information For help and guidance and more information about diabetes, please go to: American Diabetes Association (ADA): www.diabetes.org American Association of Diabetes Care and Education Specialists (ADCES): www.diabeteseducator.org International Diabetes Federation (IDF): DCOnly.dk Contact a doctor if: Your blood sugar is at or above 240 mg/dL (27.2 mmol/L) for 2 days in a row. You have been sick for 2 days or more, and you are not getting better. You have had a fever for 2 days or more, and you are not getting better. You have any of these problems for more than 6 hours: You cannot eat or drink. You feel like you may vomit. You vomit. You have watery poop (diarrhea). Get help right away if: Your blood sugar is lower than 54 mg/dL (3 mmol/L). You  feel mixed up (confused). You have trouble thinking clearly. You have trouble breathing. You have medium or large ketone levels in your pee. These symptoms may be an emergency. Get help right away. Call your local emergency services (911 in the [place] Do not wait to see if the symptoms will go away. Do not drive yourself to the hospital. Summary Type 2 diabetes is a long-term disease. Your pancreas may not make enough insulin, or your body may not react in a normal way to insulin that it makes. This condition is treated with an eating plan, lifestyle changes, and medicines. Your doctor will set treatment goals for you. These will help you keep your blood sugar in a healthy range. Keep all follow-up visits. This information is not intended to replace advice given to you by your health care provider. Make sure you discuss any questions you have with your health care provider. Document Revised: 06/02/2020 Document Reviewed: 06/02/2020 Elsevier Patient Education  2024 [employer].

## 2022-08-26 ENCOUNTER — Encounter: Payer: Self-pay | Admitting: Cardiology

## 2022-08-26 ENCOUNTER — Ambulatory Visit: Payer: 59 | Admitting: Cardiology

## 2022-08-26 VITALS — BP 137/54 | HR 52 | Ht 62.0 in | Wt 189.0 lb

## 2022-08-26 DIAGNOSIS — E785 Hyperlipidemia, unspecified: Secondary | ICD-10-CM | POA: Diagnosis not present

## 2022-08-26 DIAGNOSIS — E1122 Type 2 diabetes mellitus with diabetic chronic kidney disease: Secondary | ICD-10-CM | POA: Diagnosis not present

## 2022-08-26 DIAGNOSIS — I700 Atherosclerosis of aorta: Secondary | ICD-10-CM

## 2022-08-26 DIAGNOSIS — I10 Essential (primary) hypertension: Secondary | ICD-10-CM | POA: Diagnosis not present

## 2022-08-26 DIAGNOSIS — E1169 Type 2 diabetes mellitus with other specified complication: Secondary | ICD-10-CM

## 2022-08-26 DIAGNOSIS — N1831 Chronic kidney disease, stage 3a: Secondary | ICD-10-CM | POA: Diagnosis not present

## 2022-08-26 DIAGNOSIS — I2510 Atherosclerotic heart disease of native coronary artery without angina pectoris: Secondary | ICD-10-CM

## 2022-08-26 DIAGNOSIS — I2584 Coronary atherosclerosis due to calcified coronary lesion: Secondary | ICD-10-CM | POA: Diagnosis not present

## 2022-08-26 NOTE — Progress Notes (Signed)
ID:  Leslie Davis, DOB 10-15-1955, MRN [id number]  PCP:  Dorothyann Peng, MD  Cardiologist:  Tessa Lerner, DO, [facility] (established care July 09, 2021) Former Cardiology Providers: Dr. Rollene Rotunda.   Date: 08/26/22 Last Office Visit: 07/05/2022  Chief Complaint  Patient presents with   Coronary atherosclerosis due to calcified coronary lesion   Follow-up    HPI  Leslie Davis is a 67 y.o. African-American female whose past medical history and cardiovascular risk factors include: CAD, Coronary artery calcification, aortic atherosclerosis, hypertension, hyperlipidemia, GERD, fatty liver disease, DM Type II, nephrolithiasis, Removal of mesenteric mass, pathology notes neuroendocrine tumor, advanced age.  Patient was referred to the practice in April 2023 for evaluation of shortness of breath and chest tightness.  Her ischemic workup included a coronary CTA in May 2023 which noted disease in the LAD.  She underwent left heart catheterization thereafter which we illustrated similar findings.  However with up titration of antianginal therapy and her becoming asymptomatic the shared decision was to treated medically.  Since then patient has been on antianginal therapy and has not had any reoccurrence of chest pain.  Since last office visit she did undergo surgery for removal of the mesenteric mass and small bowel mass.  Pathology report reviewed. She is recovering well postsurgery.  She did not have any cardiovascular complications during her recent hospitalization.  Home blood pressures range between 130-140 mmHg.  Currently she denies anginal chest pain or heart failure symptoms.  ALLERGIES: Allergies  Allergen Reactions   Fiasp [Insulin Aspart (W-Niacinamide)] Hives    MEDICATION LIST PRIOR TO VISIT: Current Meds  Medication Sig   acetaminophen (TYLENOL) 500 MG tablet Take 1 tablet (500 mg total) by mouth every 6 (six) hours as needed for mild pain.   amLODipine (NORVASC) 10  MG tablet TAKE ONE TABLET BY MOUTH ONCE DAILY   aspirin EC 81 MG tablet Take 1 tablet (81 mg total) by mouth daily. Swallow whole.   Blood Glucose Monitoring Suppl (ONETOUCH VERIO) w/Device KIT Use as directed to check blood sugars 2 times per day dx: e11.65   carvedilol (COREG) 3.125 MG tablet TAKE ONE TABLET BY MOUTH TWICE DAILY WITH A MEAL   celecoxib (CELEBREX) 200 MG capsule Take 1 capsule (200 mg total) by mouth 2 (two) times daily as needed.   cetirizine (ZYRTEC ALLERGY) 10 MG tablet Take 1 tablet (10 mg total) by mouth daily.   clindamycin (CLEOCIN T) 1 % lotion Apply 1 Application topically daily as needed (acne).   docusate sodium (COLACE) 100 MG capsule Take 1 capsule (100 mg total) by mouth 2 (two) times daily.   EDARBI 80 MG TABS TAKE ONE TABLET BY MOUTH ONCE DAILY   ezetimibe (ZETIA) 10 MG tablet Take 1 tablet (10 mg total) by mouth daily.   FARXIGA 10 MG TABS tablet TAKE ONE TABLET BY MOUTH ONCE DAILY BEFORE BREAKFAST   ferrous gluconate (FERGON) 324 MG tablet TAKE 1 TABLET BY MOUTH TWICE DAILY WITH A MEAL.   glucose blood (ONETOUCH VERIO) test strip USE TO check blood glucose TWICE DAILY AS DIRECTED   insulin lispro (HUMALOG) 100 UNIT/ML KwikPen Inject 0-6 Units into the skin 3 (three) times daily with meals. Check Blood Glucose (BG) and inject per scale: BG <150= 0 unit; BG 150-200= 1 unit; BG 201-250= 2 unit; BG 251-300= 3 unit; BG 301-350= 4 unit; BG 351-400= 5 unit; BG >400= 6 unit and Call Primary Care.   Insulin Pen Needle (PEN NEEDLES) 31G X  5 MM MISC 1 each by Does not apply route 3 (three) times daily. May dispense any manufacturer covered by patient's insurance.   linaclotide ([name]) 145 MCG CAPS capsule Take 1 capsule (145 mcg total) by mouth daily.   nitroGLYCERIN (NITROSTAT) 0.4 MG SL tablet Place 1 tablet (0.4 mg total) under the tongue every 5 (five) minutes as needed for chest pain. If you require more than two tablets five minutes apart go to the nearest ER via EMS.    ondansetron (ZOFRAN) 4 MG tablet Take 1 tablet (4 mg total) by mouth every 8 (eight) hours as needed for nausea or vomiting.   OneTouch Delica Lancets 33G MISC Use as directed to check blood sugars 2 times per day dx:e11.65   oxyCODONE (OXY IR/ROXICODONE) 5 MG immediate release tablet Take 1 tablet (5 mg total) by mouth every 6 (six) hours as needed for severe pain.   pantoprazole (PROTONIX) 40 MG tablet TAKE ONE TABLET BY MOUTH TWICE DAILY   polyethylene glycol (MIRALAX / GLYCOLAX) 17 g packet Take 17 g by mouth daily as needed for mild constipation.   rosuvastatin (CRESTOR) 20 MG tablet TAKE ONE TABLET BY MOUTH ONCE DAILY   Semaglutide,0.25 or 0.5MG /DOS, 2 MG/3ML SOPN Inject 0.25mg  sq weekly   sucralfate (CARAFATE) 1 g tablet Take 1 tablet (1 g total) by mouth 4 (four) times daily -  with meals and at bedtime.   temazepam (RESTORIL) 30 MG capsule TAKE ONE CAPSULE BY MOUTH AT bedtime AS NEEDED   TRUEPLUS PEN NEEDLES 31G X 6 MM MISC USE WITH pen TO INJECT insulin DAILY     PAST MEDICAL HISTORY: Past Medical History:  Diagnosis Date   CKD (chronic kidney disease)    stage 3   Coronary artery disease 07/2021   obstructive CAD-70% LAD-treated medically   DM (diabetes mellitus) (HCC)    Fatty liver disease, nonalcoholic    Gastritis    Gastroparesis    GERD (gastroesophageal reflux disease)    Heartburn    Hiatal hernia    HLD (hyperlipidemia)    HTN (hypertension)    Joint pain    Renal calculus    Rheumatoid arthritis (HCC)    Vitamin D deficiency     PAST SURGICAL HISTORY: Past Surgical History:  Procedure Laterality Date   BREAST BIOPSY  02/04/2022   MM LT RADIOACTIVE SEED EA ADD LESION LOC MAMMO GUIDE 02/04/2022 [facility]   BREAST BIOPSY  02/04/2022   MM LT RADIOACTIVE SEED LOC MAMMO GUIDE 02/04/2022 [facility]   BREAST LUMPECTOMY WITH RADIOACTIVE SEED LOCALIZATION Left 02/05/2022   Procedure: LEFT BREAST BRACKETED LUMPECTOMY WITH RADIOACTIVE SEED  LOCALIZATION;  Surgeon: Griselda Miner, MD;  Location: [facility];  Service: General;  Laterality: Left;   CHOLECYSTECTOMY     COLONOSCOPY  09/10/2011   normal   JOINT REPLACEMENT  2022   left TKA   LAPAROSCOPIC REMOVAL ABDOMINAL MASS N/A 08/05/2022   Procedure: LAPAROSCOPIC ASSISTED EXCISION OF MESENTERIC MASS;  Surgeon: Fritzi Mandes, MD;  Location: [facility] OR;  Service: General;  Laterality: N/A;   LAPAROSCOPIC SMALL BOWEL RESECTION N/A 08/05/2022   Procedure: LAPAROSCOPIC SMALL BOWEL RESECTION;  Surgeon: Fritzi Mandes, MD;  Location: [facility] OR;  Service: General;  Laterality: N/A;   LEFT HEART CATH AND CORONARY ANGIOGRAPHY N/A 08/18/2021   Procedure: LEFT HEART CATH AND CORONARY ANGIOGRAPHY;  Surgeon: Elder Negus, MD;  Location: [facility];  Service: Cardiovascular;  Laterality: N/A;   TOTAL KNEE  ARTHROPLASTY Left 10/27/2020   Procedure: LEFT TOTAL KNEE ARTHROPLASTY;  Surgeon: Tarry Kos, MD;  Location: [facility] OR;  Service: Orthopedics;  Laterality: Left;   TUBAL LIGATION     UPPER GASTROINTESTINAL ENDOSCOPY      FAMILY HISTORY: The patient family history includes COPD in her sister; Diabetes in her brother, maternal grandmother, mother, and sister; Heart disease in her sister; Heart disease (age of onset: 6) in her brother; Hyperlipidemia in her father; Hypertension in her father; Kidney disease in her mother and sister; Stomach cancer in her maternal aunt; Stroke in her father; Sudden death in her father; Thyroid disease in her mother.  SOCIAL HISTORY:  The patient  reports that she quit smoking about 47 years ago. Her smoking use included cigarettes. She has a 0.25 pack-year smoking history. She has never used smokeless tobacco. She reports that she does not drink alcohol and does not use drugs.  REVIEW OF SYSTEMS: Review of Systems  Constitutional: Negative for malaise/fatigue.  Cardiovascular:  Negative for chest pain, dyspnea on exertion, leg swelling,  near-syncope, orthopnea, palpitations, paroxysmal nocturnal dyspnea and syncope.  Respiratory:  Negative for shortness of breath.   Gastrointestinal:  Positive for abdominal pain (at times).    PHYSICAL EXAM:    08/26/2022   10:27 AM 08/26/2022   10:19 AM 08/24/2022    9:55 AM  Vitals with BMI  Height  5\' 2"    Weight  189 lbs   BMI  34.56   Systolic 137 154 161  Diastolic 54 56 60  Pulse 52 48     Physical Exam  Neck: No JVD present.  Cardiovascular: Normal rate, regular rhythm, S1 normal, S2 normal, intact distal pulses and normal pulses. Exam reveals no gallop, no S3 and no S4.  No murmur heard. Pulses:      Dorsalis pedis pulses are 2+ on the right side and 2+ on the left side.       Posterior tibial pulses are 2+ on the right side and 2+ on the left side.  Pulmonary/Chest: Effort normal and breath sounds normal. No stridor. She has no wheezes. She has no rales. She exhibits no tenderness.  Abdominal: Soft. Bowel sounds are normal. She exhibits no distension. There is no abdominal tenderness.  Musculoskeletal:        General: No edema.    RADIOLOGY CTA PE protocol: June 25, 2021 1. No pulmonary embolus or acute intrathoracic abnormality. 2. Coronary artery calcifications. Aortic Atherosclerosis (ICD10-I70.0)  CARDIAC DATABASE: EKG: 02/23/2022: Normal sinus rhythm, 64 bpm, consider old anteroseptal infarct, without underlying injury pattern. July 05, 2022: Sinus bradycardia, 56 bpm, TWI in lateral leads consider possible ischemia, without underlying injury pattern.  Since last office visit rate is lower and TWI noticable.   Echocardiogram: 07/15/2021:  Normal LV systolic function with EF 63%. Left ventricle cavity is normal  in size. Mild concentric remodeling of the left ventricle. Normal global  wall motion. Normal diastolic filling pattern. Calculated EF 63%.  Left atrial cavity is mildly dilated by volume.  Structurally normal tricuspid valve.  Mild tricuspid  regurgitation. No  evidence of pulmonary hypertension.   Stress Testing: 11/03/2018 [facility] available in Care Everywhere: Per report: No reversible ischemia or infarction, normal wall motion, calculated LVEF 84%, low risk study  CCTA: 07/22/2021 1. Total coronary calcium score of 341. This was 93rd percentile for age and sex matched control. 2. Normal coronary origin with co-dominance. 3. CAD-RADS = 3.  Left  Main: Patent.   LAD: Mild stenosis (25-49%) at the proximal LAD due to calcified plaque. Moderate stenosis (50-69%) at mid LAD after the take off of second diagonal branch due to mixed eccentric plaque (lesion is about 2 cm in length). Mild stenosis (25-49%) due to calcified plaque within the distal and apical LAD. First Diagonal branch is patent. Second diagonal branch patent with calcified plaque at the mid-segment.   LCx: Patent.   RCA: Moderate stenosis (50-69%) at the ostial RCA due to eccentric mixed plaque and remainder of the vessel is patent.   4. Aortic atherosclerosis.   5. Study is sent for CT-FFR to further evaluate the RCA and LAD disease. Findings will be performed and reported separately.  6. No acute or significant incidental extracardiac findings in the chest.  CTFFR  May 2023: CT FFR analysis illustrates hemodynamically significant stenosis within the mid LAD (distal to second diagonal branch).  Heart Catheterization: 08/18/2021: LM: Normal LAD: Prox calcific 30% disease         Mid, focal, eccentric 70% stenosis Lcx: No significant disease RCA: Ostial 40% stenosis Patient opted medical therapy. In absence of any angina or angina equivalent symptoms on excellent medical therapy, reasonable to continue the same at that time.  In future, if symptoms increase, could consider PCI to mid LAD.   LABORATORY DATA:    Latest Ref Rng & Units 08/06/2022    8:09 AM 07/28/2022   10:00 AM 06/24/2022   10:20 AM  CBC  WBC 4.0 -  10.5 K/uL 6.2  4.7  3.6   Hemoglobin 12.0 - 15.0 g/dL 16.1  09.6  04.5   Hematocrit 36.0 - 46.0 % 33.4  39.0  39.4   Platelets 150 - 400 K/uL 164  219  220        Latest Ref Rng & Units 08/24/2022   10:04 AM 08/06/2022    8:09 AM 07/28/2022   10:00 AM  CMP  Glucose 70 - 99 mg/dL 409  811  914   BUN 8 - 27 mg/dL 29  16  21    Creatinine 0.57 - 1.00 mg/dL 7.82  9.56  2.13   Sodium 134 - 144 mmol/L 140  134  137   Potassium 3.5 - 5.2 mmol/L 4.9  4.1  4.6   Chloride 96 - 106 mmol/L 104  103  104   CO2 20 - 29 mmol/L 22  23  25    Calcium 8.7 - 10.3 mg/dL 9.8  8.5  9.4   Total Protein 6.5 - 8.1 g/dL   7.0   Total Bilirubin 0.3 - 1.2 mg/dL   0.4   Alkaline Phos 38 - 126 U/L   114   AST 15 - 41 U/L   27   ALT 0 - 44 U/L   39     Lipid Panel  Lab Results  Component Value Date   CHOL 206 (H) 07/05/2022   HDL 109 07/05/2022   LDLCALC 87 07/05/2022   TRIG 52 07/05/2022   CHOLHDL 1.9 07/05/2022     No components found for: "NTPROBNP" No results for input(s): "PROBNP" in the last 8760 hours. No results for input(s): "TSH" in the last 8760 hours.   BMP Recent Labs    02/01/22 1239 03/23/22 1032 07/28/22 1000 08/06/22 0809 08/24/22 1004  NA 140   < > 137 134* 140  K 4.7   < > 4.6 4.1 4.9  CL 109   < > 104 103 104  CO2  24   < > 25 23 22   GLUCOSE 90   < > 208* 201* 233*  BUN 24*   < > 21 16 29*  CREATININE 0.99   < > 1.25* 1.06* 1.19*  CALCIUM 9.3   < > 9.4 8.5* 9.8  GFRNONAA >60  --  47* 57*  --    < > = values in this interval not displayed.    HEMOGLOBIN A1C Lab Results  Component Value Date   HGBA1C 6.8 (H) 07/05/2022   MPG 134.11 10/22/2020    IMPRESSION:    ICD-10-CM   1. Coronary atherosclerosis due to calcified coronary lesion  I25.10    I25.84     2. Atherosclerosis of native coronary artery of native heart without angina pectoris  I25.10     3. Atherosclerosis of aorta (HCC)  I70.0     4. Benign hypertension  I10     5. Type 2 diabetes mellitus with  stage 3a chronic kidney disease, without long-term current use of insulin (HCC)  E11.22    N18.31     6. Type 2 diabetes mellitus with hyperlipidemia [facility])  E11.69    E78.5         RECOMMENDATIONS: Leslie Davis is a 67 y.o. African-American female whose past medical history and cardiac risk factors include: CAD, Coronary artery calcification, aortic atherosclerosis, hypertension, hyperlipidemia, GERD, fatty liver disease, IDDM Type II, nephrolithiasis, advanced age.  Coronary atherosclerosis due to calcified coronary lesion Atherosclerosis of native coronary artery of native heart without angina pectoris Atherosclerosis of aorta Denies angina pectoris. No sublingual nitroglycerin tablets since last office visit. LAD disease has been treated with medical therapy since May 2023. Total CAC 341, 93rd percentile Continue aspirin and statin therapy. Reemphasized importance of secondary prevention.  Benign hypertension Home and office blood pressures are acceptable. Recommend a goal SBP of 130 mmHg. In the past she has had episodes of hypotension. Medications reconciled Did not tolerate BiDil and feels fatigued with Toprol-XL 12.5 mg p.o. daily  Type 2 diabetes mellitus with stage 3a chronic kidney disease, with long-term current use of insulin and hyperlipidemia Hemoglobin A1c well-controlled. Patient states that she is no longer on insulin. Currently on carvedilol, ARB, Farxiga, Zetia, rosuvastatin Reemphasized importance of glycemic control.  FINAL MEDICATION LIST END OF ENCOUNTER: No orders of the defined types were placed in this encounter.   There are no discontinued medications.     Current Outpatient Medications:    acetaminophen (TYLENOL) 500 MG tablet, Take 1 tablet (500 mg total) by mouth every 6 (six) hours as needed for mild pain., Disp: 30 tablet, Rfl: 0   amLODipine (NORVASC) 10 MG tablet, TAKE ONE TABLET BY MOUTH ONCE DAILY, Disp: 90 tablet, Rfl: 3    aspirin EC 81 MG tablet, Take 1 tablet (81 mg total) by mouth daily. Swallow whole., Disp: 150 tablet, Rfl: 2   Blood Glucose Monitoring Suppl (ONETOUCH VERIO) w/Device KIT, Use as directed to check blood sugars 2 times per day dx: e11.65, Disp: 1 kit, Rfl: 1   carvedilol (COREG) 3.125 MG tablet, TAKE ONE TABLET BY MOUTH TWICE DAILY WITH A MEAL, Disp: 180 tablet, Rfl: 1   celecoxib (CELEBREX) 200 MG capsule, Take 1 capsule (200 mg total) by mouth 2 (two) times daily as needed., Disp: 180 capsule, Rfl: 2   cetirizine (ZYRTEC ALLERGY) 10 MG tablet, Take 1 tablet (10 mg total) by mouth daily., Disp: 30 tablet, Rfl: 2   clindamycin (CLEOCIN T) 1 % lotion,  Apply 1 Application topically daily as needed (acne)., Disp: , Rfl:    docusate sodium (COLACE) 100 MG capsule, Take 1 capsule (100 mg total) by mouth 2 (two) times daily., Disp: 10 capsule, Rfl: 0   EDARBI 80 MG TABS, TAKE ONE TABLET BY MOUTH ONCE DAILY, Disp: 90 tablet, Rfl: 2   ezetimibe (ZETIA) 10 MG tablet, Take 1 tablet (10 mg total) by mouth daily., Disp: 90 tablet, Rfl: 3   FARXIGA 10 MG TABS tablet, TAKE ONE TABLET BY MOUTH ONCE DAILY BEFORE BREAKFAST, Disp: 90 tablet, Rfl: 2   ferrous gluconate (FERGON) 324 MG tablet, TAKE 1 TABLET BY MOUTH TWICE DAILY WITH A MEAL., Disp: 100 tablet, Rfl: 0   glucose blood (ONETOUCH VERIO) test strip, USE TO check blood glucose TWICE DAILY AS DIRECTED, Disp: 150 strip, Rfl: 11   insulin lispro (HUMALOG) 100 UNIT/ML KwikPen, Inject 0-6 Units into the skin 3 (three) times daily with meals. Check Blood Glucose (BG) and inject per scale: BG <150= 0 unit; BG 150-200= 1 unit; BG 201-250= 2 unit; BG 251-300= 3 unit; BG 301-350= 4 unit; BG 351-400= 5 unit; BG >400= 6 unit and Call Primary Care., Disp: 15 mL, Rfl: 0   Insulin Pen Needle (PEN NEEDLES) 31G X 5 MM MISC, 1 each by Does not apply route 3 (three) times daily. May dispense any manufacturer covered by patient's insurance., Disp: 100 each, Rfl: 0   linaclotide  ([name]) 145 MCG CAPS capsule, Take 1 capsule (145 mcg total) by mouth daily., Disp: 30 capsule, Rfl: 0   nitroGLYCERIN (NITROSTAT) 0.4 MG SL tablet, Place 1 tablet (0.4 mg total) under the tongue every 5 (five) minutes as needed for chest pain. If you require more than two tablets five minutes apart go to the nearest ER via EMS., Disp: 30 tablet, Rfl: 0   ondansetron (ZOFRAN) 4 MG tablet, Take 1 tablet (4 mg total) by mouth every 8 (eight) hours as needed for nausea or vomiting., Disp: 20 tablet, Rfl: 0   OneTouch Delica Lancets 33G MISC, Use as directed to check blood sugars 2 times per day dx:e11.65, Disp: 150 each, Rfl: 3   oxyCODONE (OXY IR/ROXICODONE) 5 MG immediate release tablet, Take 1 tablet (5 mg total) by mouth every 6 (six) hours as needed for severe pain., Disp: 20 tablet, Rfl: 0   pantoprazole (PROTONIX) 40 MG tablet, TAKE ONE TABLET BY MOUTH TWICE DAILY, Disp: 60 tablet, Rfl: 3   polyethylene glycol (MIRALAX / GLYCOLAX) 17 g packet, Take 17 g by mouth daily as needed for mild constipation., Disp: 14 each, Rfl: 0   rosuvastatin (CRESTOR) 20 MG tablet, TAKE ONE TABLET BY MOUTH ONCE DAILY, Disp: 90 tablet, Rfl: 2   Semaglutide,0.25 or 0.5MG /DOS, 2 MG/3ML SOPN, Inject 0.25mg  sq weekly, Disp: 3 mL, Rfl: 1   sucralfate (CARAFATE) 1 g tablet, Take 1 tablet (1 g total) by mouth 4 (four) times daily -  with meals and at bedtime., Disp: 60 tablet, Rfl: 4   temazepam (RESTORIL) 30 MG capsule, TAKE ONE CAPSULE BY MOUTH AT bedtime AS NEEDED, Disp: 30 capsule, Rfl: 3   TRUEPLUS PEN NEEDLES 31G X 6 MM MISC, USE WITH pen TO INJECT insulin DAILY, Disp: 150 each, Rfl: 3  No orders of the defined types were placed in this encounter.  There are no Patient Instructions on file for this visit.   --Continue cardiac medications as reconciled in final medication list. --Return in about 6 months (around 02/25/2023) for Follow up CAD.  Or sooner if needed. --Continue follow-up with your primary care physician  regarding the management of your other chronic comorbid conditions.  Patient's questions and concerns were addressed to her satisfaction. She voices understanding of the instructions provided during this encounter.   This note was created using a voice recognition software as a result there may be grammatical errors inadvertently enclosed that do not reflect the nature of this encounter. Every attempt is made to correct such errors.  Tessa Lerner, [place], [facility]  Pager:  [phone] Office: [phone]

## 2022-08-30 ENCOUNTER — Telehealth: Payer: Self-pay

## 2022-08-30 NOTE — Progress Notes (Unsigned)
Care Management & Coordination Services Pharmacy Team  Reason for Encounter: Medication coordination and delivery  Contacted patient to discuss medications and coordinate delivery from [facility]. {US HC Outreach:28874} Cycle dispensing form sent to *** for review.   Last adherence delivery date: 08-11-2022  Patient is due for next adherence delivery on: 09-10-2022  This delivery to include: {Packaging choices:23805}  {Day Supply:23806}  Leslie Davis 10mg  1 tablet daily Edarbi 80 mg- 1 tablet daily  Fergon 324 mg  2 times a day  Ozempic 2 mg weekly  Amlodipine 10 mg daily Rosuvastatin 20 mg daily Carvedilol 3.125 mg twice daily Pantoprazole 40 mg daily  Aspirin 81 mg daily Cetirizine 10 mg daily Temazepam 30 mg at night PRN Zofran 4 mg every 4 hours PRN Zetia 10 mg daily  [name] 145 mcg daily Clindamycin  Test strips Oxycodone 5 mg PRN Humalog- sliding scale Needles Methocarbamol 500 mg PRN Miralax  Patient declined the following medications this month: ***  Refills requested from providers include: Zofran  Pantoprazole   {Delivery date:25786}   Any concerns about your medications? {yes/no:20286}  How often do you forget or accidentally miss a dose? {Missed doses:25554}  Do you use a pillbox? {yes/no:20286}  Is patient in packaging {yes/no:20286}  If yes  What is the date on your next pill pack?  Any concerns or issues with your packaging?   Recent blood pressure readings are as follows:***  Recent blood glucose readings are as follows:***   Chart review: Recent office visits:  08-24-2022 Dorothyann Peng, MD. Hospital follow up.  Recent consult visits:  08-26-2022 Tessa Lerner, DO (Cardiology). Follow up visit for Coronary atherosclerosis due to calcified coronary lesion. Start ozempic 0.25 mg weekly.  08-19-2022 Tarry Kos, MD (Ortho surgery). Visit for left leg pain.  Hospital visits:  Medication Reconciliation was completed by comparing  discharge summary, patient's EMR and Pharmacy list, and upon discussion with patient.   Admitted to the hospital on 08-05-2022 due to Laparoscopic ASSISTED EXCISION OF MESENTERIC MASS            procedure. Discharge date was 08-09-2022. Discharged from [facility]     New?Medications Started at [facility] Discharge:?? Humalog per sliding scale Tylenol 500 mg every 6 hours PRN Colace 100 mg twice daily Robaxin 500 mg every 8 hours PRN Miralax 17 g daily PRN Oxycodone 5 mg every 6 hours PRN   Medication Changes at Hospital Discharge: None   Medications Discontinued at Hospital Discharge: Ozempic   Medications that remain the same after Hospital Discharge:??  -All other medications will remain the same.   Medications: Outpatient Encounter Medications as of 08/30/2022  Medication Sig   acetaminophen (TYLENOL) 500 MG tablet Take 1 tablet (500 mg total) by mouth every 6 (six) hours as needed for mild pain.   amLODipine (NORVASC) 10 MG tablet TAKE ONE TABLET BY MOUTH ONCE DAILY   aspirin EC 81 MG tablet Take 1 tablet (81 mg total) by mouth daily. Swallow whole.   Blood Glucose Monitoring Suppl (ONETOUCH VERIO) w/Device KIT Use as directed to check blood sugars 2 times per day dx: e11.65   carvedilol (COREG) 3.125 MG tablet TAKE ONE TABLET BY MOUTH TWICE DAILY WITH A MEAL   celecoxib (CELEBREX) 200 MG capsule Take 1 capsule (200 mg total) by mouth 2 (two) times daily as needed.   cetirizine (ZYRTEC ALLERGY) 10 MG tablet Take 1 tablet (10 mg total) by mouth daily.   clindamycin (CLEOCIN T) 1 % lotion Apply 1 Application  topically daily as needed (acne).   docusate sodium (COLACE) 100 MG capsule Take 1 capsule (100 mg total) by mouth 2 (two) times daily.   EDARBI 80 MG TABS TAKE ONE TABLET BY MOUTH ONCE DAILY   ezetimibe (ZETIA) 10 MG tablet Take 1 tablet (10 mg total) by mouth daily.   FARXIGA 10 MG TABS tablet TAKE ONE TABLET BY MOUTH ONCE DAILY BEFORE BREAKFAST   ferrous gluconate  (FERGON) 324 MG tablet TAKE 1 TABLET BY MOUTH TWICE DAILY WITH A MEAL.   glucose blood (ONETOUCH VERIO) test strip USE TO check blood glucose TWICE DAILY AS DIRECTED   insulin lispro (HUMALOG) 100 UNIT/ML KwikPen Inject 0-6 Units into the skin 3 (three) times daily with meals. Check Blood Glucose (BG) and inject per scale: BG <150= 0 unit; BG 150-200= 1 unit; BG 201-250= 2 unit; BG 251-300= 3 unit; BG 301-350= 4 unit; BG 351-400= 5 unit; BG >400= 6 unit and Call Primary Care.   Insulin Pen Needle (PEN NEEDLES) 31G X 5 MM MISC 1 each by Does not apply route 3 (three) times daily. May dispense any manufacturer covered by patient's insurance.   linaclotide ([name]) 145 MCG CAPS capsule Take 1 capsule (145 mcg total) by mouth daily.   nitroGLYCERIN (NITROSTAT) 0.4 MG SL tablet Place 1 tablet (0.4 mg total) under the tongue every 5 (five) minutes as needed for chest pain. If you require more than two tablets five minutes apart go to the nearest ER via EMS.   ondansetron (ZOFRAN) 4 MG tablet Take 1 tablet (4 mg total) by mouth every 8 (eight) hours as needed for nausea or vomiting.   OneTouch Delica Lancets 33G MISC Use as directed to check blood sugars 2 times per day dx:e11.65   oxyCODONE (OXY IR/ROXICODONE) 5 MG immediate release tablet Take 1 tablet (5 mg total) by mouth every 6 (six) hours as needed for severe pain.   pantoprazole (PROTONIX) 40 MG tablet TAKE ONE TABLET BY MOUTH TWICE DAILY   polyethylene glycol (MIRALAX / GLYCOLAX) 17 g packet Take 17 g by mouth daily as needed for mild constipation.   rosuvastatin (CRESTOR) 20 MG tablet TAKE ONE TABLET BY MOUTH ONCE DAILY   Semaglutide,0.25 or 0.5MG /DOS, 2 MG/3ML SOPN Inject 0.25mg  sq weekly   sucralfate (CARAFATE) 1 g tablet Take 1 tablet (1 g total) by mouth 4 (four) times daily -  with meals and at bedtime.   temazepam (RESTORIL) 30 MG capsule TAKE ONE CAPSULE BY MOUTH AT bedtime AS NEEDED   TRUEPLUS PEN NEEDLES 31G X 6 MM MISC USE WITH pen TO  INJECT insulin DAILY   No facility-administered encounter medications on file as of 08/30/2022.   BP Readings from Last 3 Encounters:  08/26/22 (!) 137/54  08/24/22 130/60  08/23/22 135/80    Pulse Readings from Last 3 Encounters:  08/26/22 (!) 52  08/24/22 (!) 50  08/09/22 (!) 58    Lab Results  Component Value Date/Time   HGBA1C 6.8 (H) 07/05/2022 09:46 AM   HGBA1C 6.3 (H) 03/23/2022 10:32 AM   Lab Results  Component Value Date   CREATININE 1.19 (H) 08/24/2022   BUN 29 (H) 08/24/2022   GFR 47.43 (L) 12/23/2020   GFRNONAA 57 (L) 08/06/2022   GFRAA 57 (L) 02/21/2020   NA 140 08/24/2022   K 4.9 08/24/2022   CALCIUM 9.8 08/24/2022   CO2 22 08/24/2022  08-30-2022: 1st attempt left VM   Malecca [facility] CMA Clinical Pharmacist Assistant [phone]

## 2022-08-31 ENCOUNTER — Other Ambulatory Visit: Payer: Self-pay

## 2022-08-31 DIAGNOSIS — K2970 Gastritis, unspecified, without bleeding: Secondary | ICD-10-CM

## 2022-08-31 MED ORDER — PANTOPRAZOLE SODIUM 40 MG PO TBEC
40.0000 mg | DELAYED_RELEASE_TABLET | Freq: Two times a day (BID) | ORAL | 3 refills | Status: DC
Start: 2022-08-31 — End: 2023-01-21

## 2022-08-31 MED ORDER — ONDANSETRON HCL 4 MG PO TABS
4.0000 mg | ORAL_TABLET | Freq: Three times a day (TID) | ORAL | 0 refills | Status: DC | PRN
Start: 2022-08-31 — End: 2022-11-23

## 2022-09-09 ENCOUNTER — Encounter: Payer: Self-pay | Admitting: Gastroenterology

## 2022-09-16 ENCOUNTER — Other Ambulatory Visit (INDEPENDENT_AMBULATORY_CARE_PROVIDER_SITE_OTHER): Payer: Self-pay | Admitting: Family Medicine

## 2022-09-16 DIAGNOSIS — K5904 Chronic idiopathic constipation: Secondary | ICD-10-CM

## 2022-09-21 ENCOUNTER — Other Ambulatory Visit: Payer: Self-pay | Admitting: Pharmacist

## 2022-09-21 NOTE — Progress Notes (Signed)
Pharmacy Quality Measure Review  This patient is appearing on a report for being at risk of failing the adherence measure for cholesterol (statin) and hypertension (ACEi/ARB) medications this calendar year.   Medication: Edarbi 80 mg daily Last fill date: 6/27 for 30 day supply  Medication: rosuvastatin 20 mg Last fill date: 6/27 for 30 day supply  Receiving adherence packaging, but since dispensed in 30 day supplies she is appearing on the report. No action needed at this time.   Catie Eppie Gibson, PharmD, BCACP, CPP Clinical Pharmacist [facility] [phone]

## 2022-10-06 ENCOUNTER — Other Ambulatory Visit: Payer: Self-pay | Admitting: Cardiology

## 2022-10-06 ENCOUNTER — Other Ambulatory Visit: Payer: Self-pay | Admitting: Nurse Practitioner

## 2022-10-06 ENCOUNTER — Other Ambulatory Visit: Payer: Self-pay | Admitting: Internal Medicine

## 2022-10-06 DIAGNOSIS — G4700 Insomnia, unspecified: Secondary | ICD-10-CM

## 2022-10-06 DIAGNOSIS — I2510 Atherosclerotic heart disease of native coronary artery without angina pectoris: Secondary | ICD-10-CM

## 2022-10-07 ENCOUNTER — Encounter: Payer: Self-pay | Admitting: Internal Medicine

## 2022-10-07 ENCOUNTER — Ambulatory Visit (INDEPENDENT_AMBULATORY_CARE_PROVIDER_SITE_OTHER): Payer: 59 | Admitting: Internal Medicine

## 2022-10-07 ENCOUNTER — Other Ambulatory Visit: Payer: Self-pay | Admitting: Internal Medicine

## 2022-10-07 VITALS — BP 128/70 | HR 51 | Temp 98.1°F | Ht 62.0 in | Wt 191.6 lb

## 2022-10-07 DIAGNOSIS — R5383 Other fatigue: Secondary | ICD-10-CM | POA: Diagnosis not present

## 2022-10-07 DIAGNOSIS — E1169 Type 2 diabetes mellitus with other specified complication: Secondary | ICD-10-CM

## 2022-10-07 DIAGNOSIS — E785 Hyperlipidemia, unspecified: Secondary | ICD-10-CM | POA: Diagnosis not present

## 2022-10-07 DIAGNOSIS — Z79899 Other long term (current) drug therapy: Secondary | ICD-10-CM

## 2022-10-07 DIAGNOSIS — I700 Atherosclerosis of aorta: Secondary | ICD-10-CM | POA: Diagnosis not present

## 2022-10-07 DIAGNOSIS — E1121 Type 2 diabetes mellitus with diabetic nephropathy: Secondary | ICD-10-CM

## 2022-10-07 DIAGNOSIS — N189 Chronic kidney disease, unspecified: Secondary | ICD-10-CM | POA: Diagnosis not present

## 2022-10-07 DIAGNOSIS — G4700 Insomnia, unspecified: Secondary | ICD-10-CM

## 2022-10-07 DIAGNOSIS — I1310 Hypertensive heart and chronic kidney disease without heart failure, with stage 1 through stage 4 chronic kidney disease, or unspecified chronic kidney disease: Secondary | ICD-10-CM

## 2022-10-07 DIAGNOSIS — Z7985 Long-term (current) use of injectable non-insulin antidiabetic drugs: Secondary | ICD-10-CM | POA: Diagnosis not present

## 2022-10-07 DIAGNOSIS — Z6835 Body mass index (BMI) 35.0-35.9, adult: Secondary | ICD-10-CM

## 2022-10-07 MED ORDER — CYANOCOBALAMIN 1000 MCG/ML IJ SOLN
1000.0000 ug | Freq: Once | INTRAMUSCULAR | Status: AC
Start: 2022-10-07 — End: 2022-10-07
  Administered 2022-10-07: 1000 ug via INTRAMUSCULAR

## 2022-10-07 MED ORDER — SEMAGLUTIDE(0.25 OR 0.5MG/DOS) 2 MG/3ML ~~LOC~~ SOPN: PEN_INJECTOR | SUBCUTANEOUS | 1 refills | Status: DC

## 2022-10-07 NOTE — Assessment & Plan Note (Signed)
She is encouraged to strive for BMi less than 30 to decrease cardiac risk. Advised to aim for at least 150 minutes of exercise per week. She is agreeable to referral to the PREP program.

## 2022-10-07 NOTE — Patient Instructions (Signed)

## 2022-10-07 NOTE — Assessment & Plan Note (Signed)
Chronic, LDL goal < 70.  She wil c/w rosuvastatin 20mg  and ezetimibe 10mg  daily. She is encouraged to follow a heart healthy lifestyle.

## 2022-10-07 NOTE — Assessment & Plan Note (Addendum)
Chronic, I will increase Ozempic to 0.5mg  weekly. She was given another sample. She states the medication is free at the pharmacy thus far. She is reminded to stop eating when full and to be intentional about her protein intake. Reminded to also aim for at least 150 minutes of exercise/week.

## 2022-10-07 NOTE — Progress Notes (Signed)
I,Jameka J Llittleton, CMA,acting as a Neurosurgeon for Gwynneth Aliment, MD.,have documented all relevant documentation on the behalf of Gwynneth Aliment, MD,as directed by  Gwynneth Aliment, MD while in the presence of Gwynneth Aliment, MD.  Subjective:  Patient ID: Leslie Davis , female    DOB: 12-10-1955 , 67 y.o.   MRN: [id number]  Chief Complaint  Patient presents with   Hyperlipidemia   Diabetes   Hypertension    HPI  Patient is here today for a diabetes/HTN follow up. She denies having any headaches, chest pain and shortness of breath. Patient reports compliance with her meds. At previous visit, she was given .25 sample of Ozempic. She reports compliance with medication. She has not had any issues with this dose of the medication. She states her sugars have improved since starting the medication. However, they are not yet at goal - typically in 130s in the mornings.    Diabetes She presents for her follow-up diabetic visit. She has type 2 diabetes mellitus. Her disease course has been stable. Pertinent negatives for diabetes include no blurred vision, no polydipsia, no polyphagia and no polyuria. There are no hypoglycemic complications. Diabetic complications include nephropathy. Risk factors for coronary artery disease include diabetes mellitus, dyslipidemia, hypertension and post-menopausal. She participates in exercise intermittently. An ACE inhibitor/angiotensin II receptor blocker is being taken. Eye exam is current.  Hypertension This is a chronic problem. The current episode started more than 1 year ago. The problem has been gradually improving since onset. Pertinent negatives include no blurred vision. Risk factors for coronary artery disease include dyslipidemia and diabetes mellitus. The current treatment provides moderate improvement.     Past Medical History:  Diagnosis Date   CKD (chronic kidney disease)    stage 3   Coronary artery disease 07/2021   obstructive CAD-70%  LAD-treated medically   DM (diabetes mellitus) (HCC)    Fatty liver disease, nonalcoholic    Gastritis    Gastroparesis    GERD (gastroesophageal reflux disease)    Heartburn    Hiatal hernia    HLD (hyperlipidemia)    HTN (hypertension)    Joint pain    Renal calculus    Rheumatoid arthritis (HCC)    Vitamin D deficiency      Family History  Problem Relation Age of Onset   Diabetes Mother    Thyroid disease Mother    Kidney disease Mother    Hypertension Father    Hyperlipidemia Father    Sudden death Father    Stroke Father    Diabetes Sister    Heart disease Sister    Kidney disease Sister    COPD Sister    Heart disease Brother 65   Diabetes Brother    Stomach cancer Maternal Aunt        X 2 aunts   Diabetes Maternal Grandmother    Colon cancer Neg Hx    Esophageal cancer Neg Hx    Rectal cancer Neg Hx    Colon polyps Neg Hx      Current Outpatient Medications:    acetaminophen (TYLENOL) 500 MG tablet, Take 1 tablet (500 mg total) by mouth every 6 (six) hours as needed for mild pain., Disp: 30 tablet, Rfl: 0   ALLERGY RELIEF CETIRIZINE 10 MG tablet, TAKE ONE TABLET BY MOUTH ONCE DAILY, Disp: 30 tablet, Rfl: 2   amLODipine (NORVASC) 10 MG tablet, TAKE ONE TABLET BY MOUTH ONCE DAILY, Disp: 90 tablet, Rfl: 3  Blood Glucose Monitoring Suppl (ONETOUCH VERIO) w/Device KIT, Use as directed to check blood sugars 2 times per day dx: e11.65, Disp: 1 kit, Rfl: 1   carvedilol (COREG) 3.125 MG tablet, TAKE ONE TABLET BY MOUTH TWICE DAILY WITH A MEAL, Disp: 180 tablet, Rfl: 1   celecoxib (CELEBREX) 200 MG capsule, Take 1 capsule (200 mg total) by mouth 2 (two) times daily as needed., Disp: 180 capsule, Rfl: 2   clindamycin (CLEOCIN T) 1 % lotion, Apply 1 Application topically daily as needed (acne)., Disp: , Rfl:    docusate sodium (COLACE) 100 MG capsule, Take 1 capsule (100 mg total) by mouth 2 (two) times daily., Disp: 10 capsule, Rfl: 0   EDARBI 80 MG TABS, TAKE ONE  TABLET BY MOUTH ONCE DAILY, Disp: 90 tablet, Rfl: 2   ezetimibe (ZETIA) 10 MG tablet, Take 1 tablet (10 mg total) by mouth daily., Disp: 90 tablet, Rfl: 3   FARXIGA 10 MG TABS tablet, TAKE ONE TABLET BY MOUTH BEFORE BREAKFAST daily, Disp: 90 tablet, Rfl: 2   ferrous gluconate (FERGON) 324 MG tablet, TAKE 1 TABLET BY MOUTH TWICE DAILY WITH A MEAL., Disp: 100 tablet, Rfl: 0   glucose blood (ONETOUCH VERIO) test strip, USE TO check blood glucose TWICE DAILY AS DIRECTED, Disp: 150 strip, Rfl: 11   insulin lispro (HUMALOG) 100 UNIT/ML KwikPen, Inject 0-6 Units into the skin 3 (three) times daily with meals. Check Blood Glucose (BG) and inject per scale: BG <150= 0 unit; BG 150-200= 1 unit; BG 201-250= 2 unit; BG 251-300= 3 unit; BG 301-350= 4 unit; BG 351-400= 5 unit; BG >400= 6 unit and Call Primary Care., Disp: 15 mL, Rfl: 0   Insulin Pen Needle (PEN NEEDLES) 31G X 5 MM MISC, 1 each by Does not apply route 3 (three) times daily. May dispense any manufacturer covered by patient's insurance., Disp: 100 each, Rfl: 0   linaclotide ([name]) 145 MCG CAPS capsule, Take 1 capsule (145 mcg total) by mouth daily., Disp: 30 capsule, Rfl: 0   ondansetron (ZOFRAN) 4 MG tablet, Take 1 tablet (4 mg total) by mouth every 8 (eight) hours as needed for nausea or vomiting., Disp: 20 tablet, Rfl: 0   OneTouch Delica Lancets 33G MISC, Use as directed to check blood sugars 2 times per day dx:e11.65, Disp: 150 each, Rfl: 3   pantoprazole (PROTONIX) 40 MG tablet, Take 1 tablet (40 mg total) by mouth 2 (two) times daily., Disp: 60 tablet, Rfl: 3   polyethylene glycol (MIRALAX / GLYCOLAX) 17 g packet, Take 17 g by mouth daily as needed for mild constipation., Disp: 14 each, Rfl: 0   rosuvastatin (CRESTOR) 20 MG tablet, TAKE ONE TABLET BY MOUTH ONCE DAILY, Disp: 90 tablet, Rfl: 2   sucralfate (CARAFATE) 1 g tablet, Take 1 tablet (1 g total) by mouth 4 (four) times daily -  with meals and at bedtime., Disp: 60 tablet, Rfl: 4    temazepam (RESTORIL) 30 MG capsule, TAKE ONE CAPSULE BY MOUTH AT bedtime AS NEEDED, Disp: 30 capsule, Rfl: 3   TRUEPLUS PEN NEEDLES 31G X 6 MM MISC, USE WITH pen TO INJECT insulin DAILY, Disp: 150 each, Rfl: 3   nitroGLYCERIN (NITROSTAT) 0.4 MG SL tablet, Place 1 tablet (0.4 mg total) under the tongue every 5 (five) minutes as needed for chest pain. If you require more than two tablets five minutes apart go to the nearest ER via EMS., Disp: 30 tablet, Rfl: 0   oxyCODONE (OXY IR/ROXICODONE) 5 MG  immediate release tablet, Take 1 tablet (5 mg total) by mouth every 6 (six) hours as needed for severe pain. (Patient not taking: Reported on 10/07/2022), Disp: 20 tablet, Rfl: 0   Semaglutide,0.25 or 0.5MG /DOS, 2 MG/3ML SOPN, Inject 0.5 mg sq weekly, Disp: 9 mL, Rfl: 1   Allergies  Allergen Reactions   Fiasp [Insulin Aspart (W-Niacinamide)] Hives     Review of Systems  Constitutional: Negative.   Eyes: Negative.  Negative for blurred vision.  Respiratory: Negative.    Cardiovascular: Negative.   Gastrointestinal: Negative.   Endocrine: Negative for polydipsia, polyphagia and polyuria.  Psychiatric/Behavioral: Negative.       Today's Vitals   10/07/22 1012  BP: 128/70  Pulse: (!) 51  Temp: 98.1 F (36.7 C)  SpO2: 98%  Weight: 191 lb 9.6 oz (86.9 kg)  Height: 5\' 2"  (1.575 m)   Body mass index is 35.04 kg/m.  Wt Readings from Last 3 Encounters:  10/07/22 191 lb 9.6 oz (86.9 kg)  08/26/22 189 lb (85.7 kg)  08/24/22 186 lb 6.4 oz (84.6 kg)    The ASCVD Risk score (Arnett DK, et al., 2019) failed to calculate for the following reasons:   The valid HDL cholesterol range is 20 to 100 mg/dL  Objective:  Physical Exam Vitals and nursing note reviewed.  Constitutional:      Appearance: Normal appearance. She is obese.  HENT:     Head: Normocephalic and atraumatic.  Eyes:     Extraocular Movements: Extraocular movements intact.  Cardiovascular:     Rate and Rhythm: Normal rate and  regular rhythm.     Heart sounds: Normal heart sounds.  Pulmonary:     Effort: Pulmonary effort is normal.     Breath sounds: Normal breath sounds.  Musculoskeletal:     Cervical back: Normal range of motion.  Skin:    General: Skin is warm.  Neurological:     General: No focal deficit present.     Mental Status: She is alert.  Psychiatric:        Mood and Affect: Mood normal.        Behavior: Behavior normal.         Assessment And Plan:  Hyperlipidemia associated with type 2 diabetes mellitus (HCC) Assessment & Plan: Chronic, I will increase Ozempic to 0.5mg  weekly. She was given another sample. She states the medication is free at the pharmacy thus far. She is reminded to stop eating when full and to be intentional about her protein intake. Reminded to also aim for at least 150 minutes of exercise/week.   Orders: -     Hemoglobin A1c -     CMP14+EGFR -     Amb Referral To Provider Referral Exercise Program (P.R.E.P)  Hypertensive heart and renal disease with renal failure, stage 1 through stage 4 or unspecified chronic kidney disease, without heart failure Assessment & Plan: Chronic, well controlled. She will c/w amlodipine 10mg , carvedilol 3.125mg  twice daily and Edarbi 80mg  daily.   Orders: -     Amb Referral To Provider Referral Exercise Program (P.R.E.P)  Atherosclerosis of aorta (HCC) Assessment & Plan: Chronic, LDL goal < 70.  She wil c/w rosuvastatin 20mg  and ezetimibe 10mg  daily. She is encouraged to follow a heart healthy lifestyle.   Orders: -     Amb Referral To Provider Referral Exercise Program (P.R.E.P)  Other fatigue -     Vitamin B12 -     Cyanocobalamin  Class 2 severe obesity due to excess  calories with serious comorbidity and body mass index (BMI) of 35.0 to 35.9 in adult [facility]) Assessment & Plan: She is encouraged to strive for BMi less than 30 to decrease cardiac risk. Advised to aim for at least 150 minutes of exercise per week. She is  agreeable to referral to the PREP program.   Orders: -     Amb Referral To Provider Referral Exercise Program (P.R.E.P)  Drug therapy  Other orders -     Semaglutide(0.25 or 0.5MG /DOS); Inject 0.5 mg sq weekly  Dispense: 9 mL; Refill: 1  She is encouraged to strive for BMI less than 30 to decrease cardiac risk. Advised to aim for at least 150 minutes of exercise per week.   Return if symptoms worsen or fail to improve.  Patient was given opportunity to ask questions. Patient verbalized understanding of the plan and was able to repeat key elements of the plan. All questions were answered to their satisfaction.    I, Gwynneth Aliment, MD, have reviewed all documentation for this visit. The documentation on 10/07/22 for the exam, diagnosis, procedures, and orders are all accurate and complete.   IF YOU HAVE BEEN REFERRED TO A SPECIALIST, IT MAY TAKE 1-2 WEEKS TO SCHEDULE/PROCESS THE REFERRAL. IF YOU HAVE NOT HEARD FROM US/SPECIALIST IN TWO WEEKS, PLEASE GIVE [place] A CALL AT [phone].

## 2022-10-08 LAB — HEMOGLOBIN A1C
Est. average glucose Bld gHb Est-mCnc: 214 mg/dL
Hgb A1c MFr Bld: 9.1 % — ABNORMAL HIGH (ref 4.8–5.6)

## 2022-10-08 LAB — CMP14+EGFR
ALT: 21 IU/L (ref 0–32)
AST: 21 IU/L (ref 0–40)
Albumin: 4.2 g/dL (ref 3.9–4.9)
Alkaline Phosphatase: 114 IU/L (ref 44–121)
BUN/Creatinine Ratio: 20 (ref 12–28)
BUN: 25 mg/dL (ref 8–27)
Bilirubin Total: <0.2 mg/dL (ref 0.0–1.2)
CO2: 22 mmol/L (ref 20–29)
Calcium: 9.5 mg/dL (ref 8.7–10.3)
Chloride: 105 mmol/L (ref 96–106)
Creatinine, Ser: 1.24 mg/dL — ABNORMAL HIGH (ref 0.57–1.00)
Globulin, Total: 2.8 g/dL (ref 1.5–4.5)
Glucose: 230 mg/dL — ABNORMAL HIGH (ref 70–99)
Potassium: 4.8 mmol/L (ref 3.5–5.2)
Sodium: 141 mmol/L (ref 134–144)
Total Protein: 7 g/dL (ref 6.0–8.5)
eGFR: 47 mL/min/{1.73_m2} — ABNORMAL LOW (ref >59)

## 2022-10-08 LAB — VITAMIN B12: Vitamin B-12: 594 pg/mL (ref 232–1245)

## 2022-10-14 ENCOUNTER — Telehealth: Payer: Self-pay

## 2022-10-14 NOTE — Telephone Encounter (Signed)
Called re: PREP program referral, left voicemail requesting return call.  

## 2022-10-15 ENCOUNTER — Other Ambulatory Visit: Payer: Self-pay | Admitting: Internal Medicine

## 2022-10-15 DIAGNOSIS — G4700 Insomnia, unspecified: Secondary | ICD-10-CM

## 2022-10-18 NOTE — Assessment & Plan Note (Signed)
Chronic, well controlled. She will c/w amlodipine 10mg , carvedilol 3.125mg  twice daily and Edarbi 80mg  daily.

## 2022-10-23 IMAGING — MG DIGITAL SCREENING BILAT W/ TOMO W/ CAD
8 series · 8 of 24 positions shown · non-contrast
Comparison: Previous exam(s).

CLINICAL DATA: Screening.

EXAM:
DIGITAL SCREENING BILATERAL MAMMOGRAM WITH TOMO AND CAD

[R MLO synth-2D]
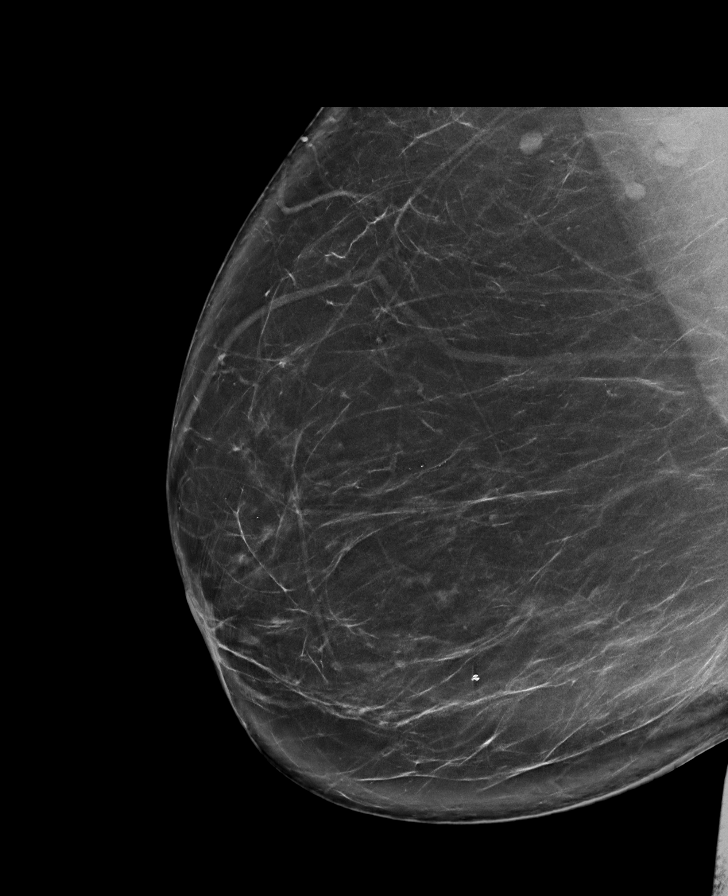

[L CC synth-2D]
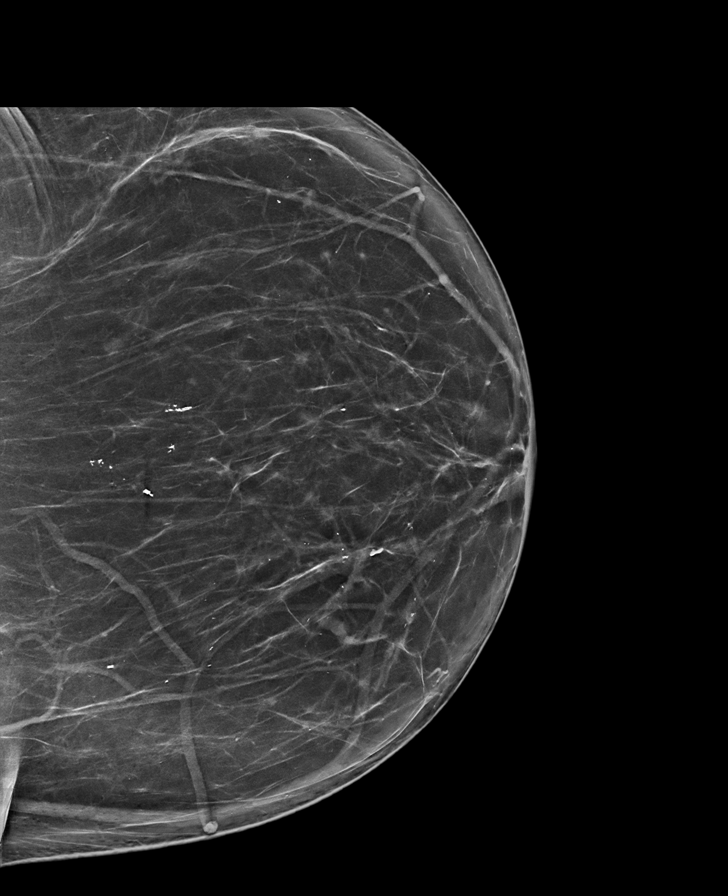

[L MLO synth-2D]
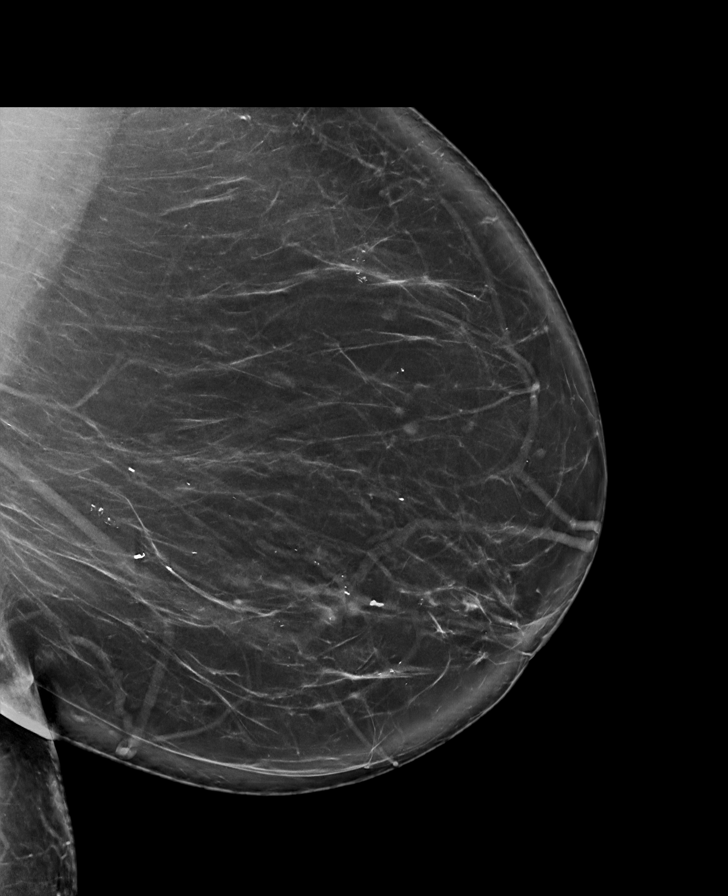

[R CC synth-2D]
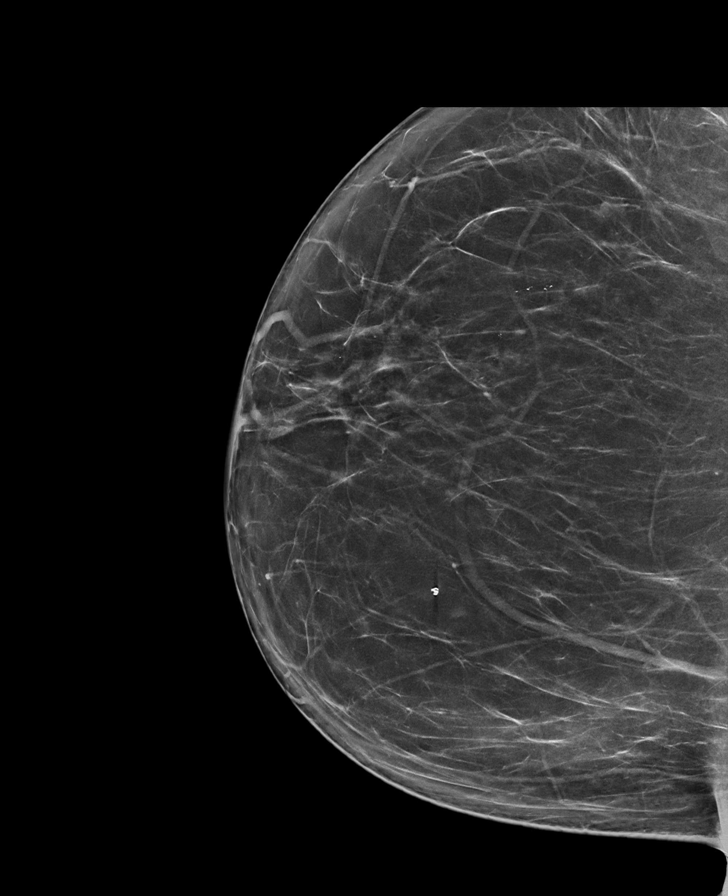

[R CC tomo · tomo slice 43/86.0]
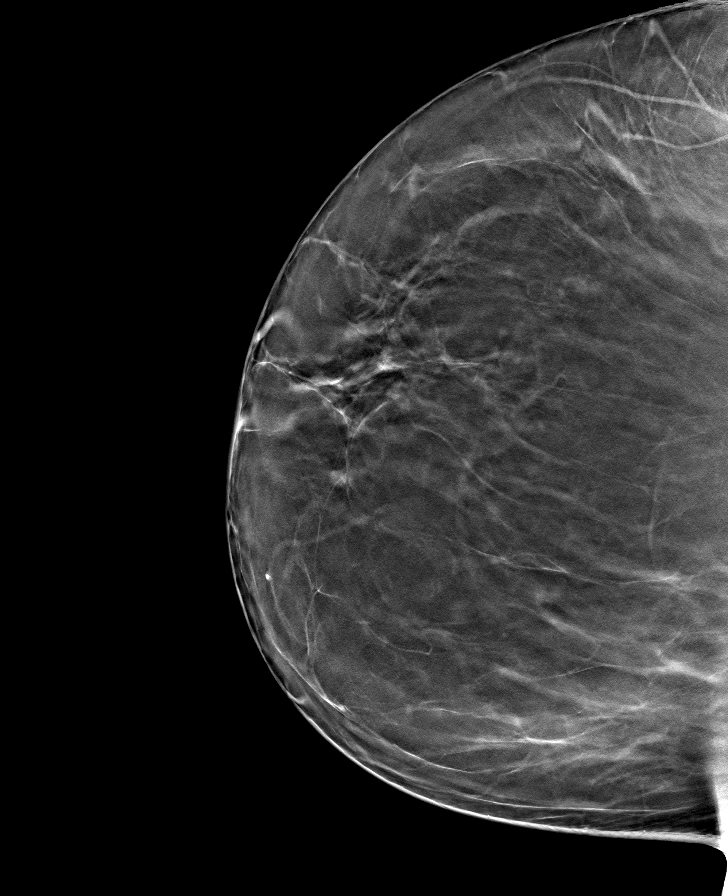

[R MLO tomo · tomo slice 51/102.0]
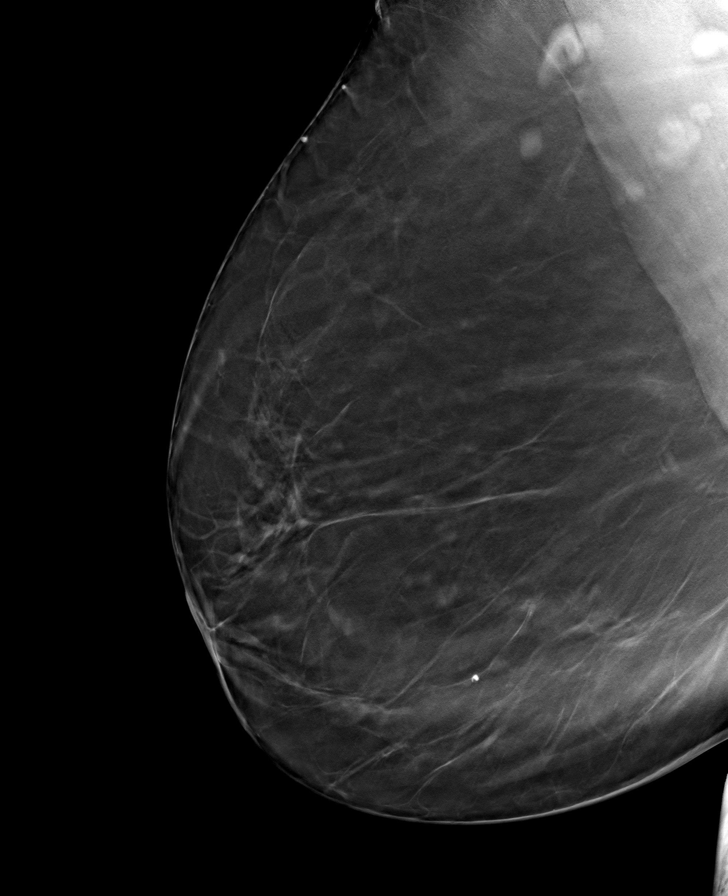

[L MLO tomo · tomo slice 52/103.0]
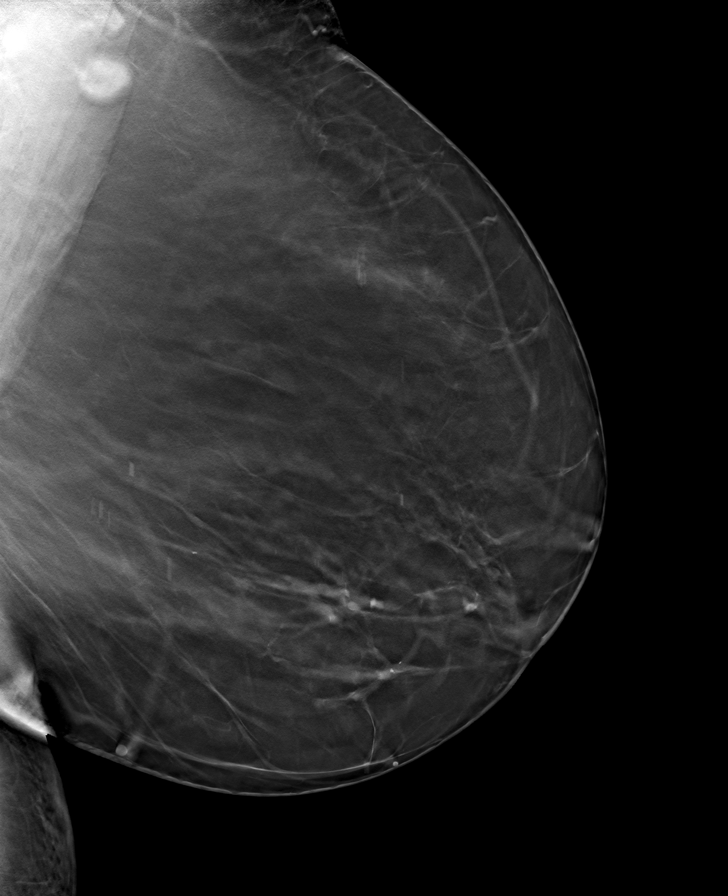

[L CC tomo · tomo slice 44/87.0]
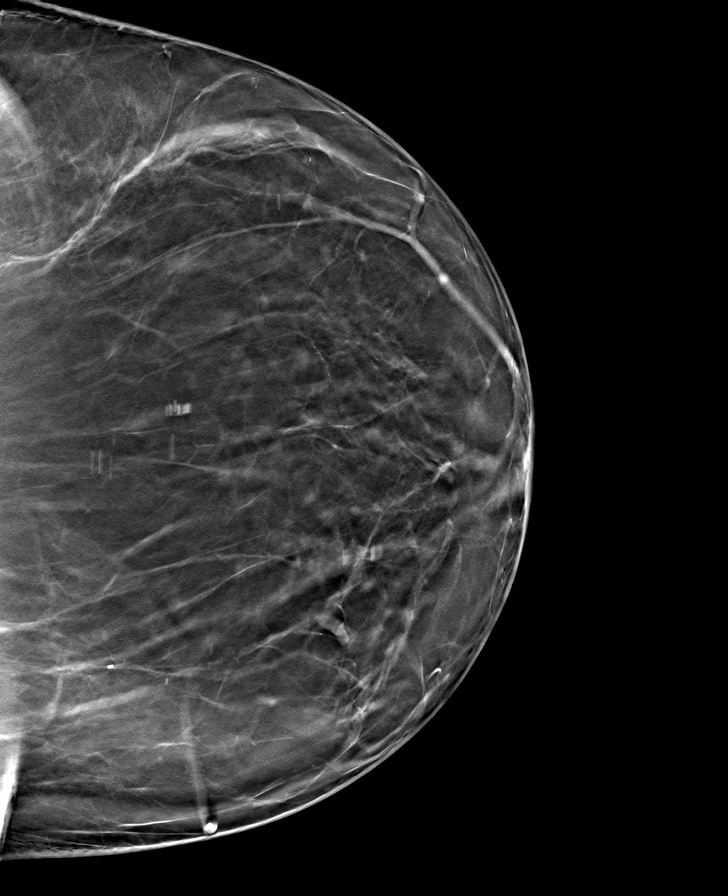

[8 of 24 positions shown; findings below may reference images not displayed]

ACR Breast Density Category b: There are scattered areas of
fibroglandular density.
FINDINGS: There are no findings suspicious for malignancy. Images were
processed with CAD.
IMPRESSION: No mammographic evidence of malignancy. A result letter of this
screening mammogram will be mailed directly to the patient.

RECOMMENDATION:
Screening mammogram in one year. (Code:[id number])

BI-RADS CATEGORY  1: Negative.

## 2022-10-25 ENCOUNTER — Encounter (INDEPENDENT_AMBULATORY_CARE_PROVIDER_SITE_OTHER): Payer: Self-pay | Admitting: Family Medicine

## 2022-10-25 ENCOUNTER — Telehealth: Payer: Self-pay | Admitting: Internal Medicine

## 2022-10-25 ENCOUNTER — Ambulatory Visit (INDEPENDENT_AMBULATORY_CARE_PROVIDER_SITE_OTHER): Payer: 59 | Admitting: Family Medicine

## 2022-10-25 VITALS — BP 143/71 | HR 50 | Temp 97.9°F | Ht 62.0 in | Wt 191.0 lb

## 2022-10-25 DIAGNOSIS — I152 Hypertension secondary to endocrine disorders: Secondary | ICD-10-CM | POA: Diagnosis not present

## 2022-10-25 DIAGNOSIS — E559 Vitamin D deficiency, unspecified: Secondary | ICD-10-CM

## 2022-10-25 DIAGNOSIS — E1159 Type 2 diabetes mellitus with other circulatory complications: Secondary | ICD-10-CM | POA: Diagnosis not present

## 2022-10-25 DIAGNOSIS — Z6834 Body mass index (BMI) 34.0-34.9, adult: Secondary | ICD-10-CM

## 2022-10-25 DIAGNOSIS — Z6829 Body mass index (BMI) 29.0-29.9, adult: Secondary | ICD-10-CM

## 2022-10-25 DIAGNOSIS — E1169 Type 2 diabetes mellitus with other specified complication: Secondary | ICD-10-CM

## 2022-10-25 DIAGNOSIS — E669 Obesity, unspecified: Secondary | ICD-10-CM

## 2022-10-25 DIAGNOSIS — Z7985 Long-term (current) use of injectable non-insulin antidiabetic drugs: Secondary | ICD-10-CM

## 2022-10-25 NOTE — Progress Notes (Signed)
Leslie Davis, D.O.  ABFM, ABOM Specializing in Clinical Bariatric Medicine  Office located at: [address] [place], [place]  [address]     Assessment and Plan:   Medications Discontinued During This Encounter  Medication Reason   oxyCODONE (OXY IR/ROXICODONE) 5 MG immediate release tablet Completed Course   sucralfate (CARAFATE) 1 g tablet Completed Course     Type 2 diabetes mellitus with obesity (HCC) Assessment & Plan:  Her DM is being treated with Ozempic 0.5 mg wkly- Dr.Sanders recently increased dose on 10/07/22. Her A1c is poorly controlled - increased from 6.8 to 9.1 on 7/18.   Lab Results  Component Value Date   HGBA1C 9.1 (H) 10/07/2022   HGBA1C 6.8 (H) 07/05/2022   HGBA1C 6.3 (H) 03/23/2022    I highly encouraged Leslie Davis to discuss with PCP and surgeon about possible screening and or genetic testing for MEN syndrome Type 2 and their thoughts on if she needs to see an endocrinologist. I recommended her to stop Ozempic until these discussions are had. I also contacted pt's PCP regarding this via Epic messenger. She will restart her prudent nutritional plan and extensive discussion was had about decreasing inflammation in her body with high antioxidant foods and avoiding all processed foods.    Hypertension associated with type 2 diabetes mellitus (HCC) Assessment & Plan: Blood pressure is not at goal today. She is completely asymptomatic. Denies any chest pain, shortness of breath, and palpitations.  No issues with amlodipine 10 mg daily, carvedilol 3.125 mg bid, and Edarbi 80 mg daily  Last 3 blood pressure readings in our office are as follows: BP Readings from Last 3 Encounters:  10/25/22 (!) 143/71  10/07/22 128/70  08/26/22 (!) 137/54   Continue with all antihypertensives per PCP. Restart Prudent nutritional plan and low sodium diet, advance exercise as tolerated.  We will continue to monitor symptoms as they relate to the her weight loss  journey.   Vitamin D deficiency Assessment & Plan: Per current med list, pt not taking any Vitamin D supplements.   Lab Results  Component Value Date   VD25OH 32.5 07/05/2022   VD25OH 48.3 09/14/2021   VD25OH 82.1 03/31/2021   Weight loss will likely improve availability of vitamin D, thus encouraged Leslie Davis to continue with meal plan and their weight loss efforts to further improve this condition.  Thus, we will need to monitor levels regularly (every 3-4 mo on average) to keep levels within normal limits and prevent over supplementation.   TREATMENT PLAN FOR OBESITY: BMI 29.0-29.9,adult - current BMI 34.93 Generalized obesity: Starting BMI 35.6 Assessment & Plan:  Leslie Davis is here to discuss her progress with her obesity treatment plan along with follow-up of her obesity related diagnoses. See Medical Weight Management Flowsheet for complete bioelectrical impedance results.  Condition is worsening. Biometric data collected today, was reviewed with patient.   Since last in person office visit on 03/23/22 patient's muscle mass has increased by 5.6 lb. Fat mass has increased by 19.6 lb. Total body water has increased by 6.6 lb.  Counseling done on how various foods will affect these numbers and how to maximize success  Total lbs lost to date: 17 lbs  Total weight loss percentage to date: 8.17%   Continue the Category 1 meal plan with B/L options  Behavioral Intervention Additional resources provided today: category 1 meal plan information, breakfast options, and lunch options Evidence-based interventions for health behavior change were utilized today including the discussion  of self monitoring techniques, problem-solving barriers and SMART goal setting techniques.   Regarding patient's less desirable eating habits and patterns, we employed the technique of small changes.  Pt will specifically work on: adhering to meal plan more closely for next visit.    Recommended  Physical Activity Goals Leslie Davis has been advised to slowly work up to 150 minutes of moderate intensity aerobic activity a week and strengthening exercises 2-3 times per week for cardiovascular health, weight loss maintenance and preservation of muscle mass. She has agreed to Continue current level of physical activity   FOLLOW UP: Return in 2-3 wks. She was informed of the importance of frequent follow up visits to maximize her success with intensive lifestyle modifications for her multiple health conditions.  Subjective:   Chief complaint: Obesity Leslie Davis is here to discuss her progress with her obesity treatment plan. She is on the Category 1 Plan with B/L options and states she is following her eating plan approximately 70% of the time. She states she is going to the gym 60 minutes 2-3 days per week.  Interval History:  Leslie Davis is here for a follow up office visit. I have not seen her since 03/24/2022 - she has been meeting with Leslie Davis. 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 She will be following up with PCP and surgeon. Reports not following her meal plan closely, but desires to get back on track.  Pharmacotherapy for weight loss: She is currently taking  Semaglutide 0.5 mg once wkly   for medical weight loss.  Denies side effects.    Review of Systems:  Pertinent positives were addressed with patient today.  Reviewed by clinician on day of visit: allergies, medications, problem list, medical history, surgical history, family history, social history, and previous encounter notes.  Weight Summary and Biometrics   Weight Lost Since Last Visit: 0lb  Weight Gained Since Last Visit: 25lb   Vitals Temp: 97.9 F (36.6 C) BP: (!) 143/71 Pulse Rate: (!) 50 SpO2: 99 %   Anthropometric Measurements Height: 5\' 2"  (1.575 m) Weight: 191 lb (86.6 kg) BMI (Calculated): 34.93 Weight at Last Visit: 166lb Weight  Lost Since Last Visit: 0lb Weight Gained Since Last Visit: 25lb Starting Weight: 208lb Total Weight Loss (lbs): 17 lb (7.711 kg) Peak Weight: 208lb   Body Composition  Body Fat %: 46.9 % Fat Mass (lbs): 89.8 lbs Muscle Mass (lbs): 96.6 lbs Total Body Water (lbs): 69.4 lbs Visceral Fat Rating : 14   Other Clinical Data Fasting: no Labs: no Today's Visit #: 23 Starting Date: 07/23/20   Objective:   PHYSICAL EXAM: Blood pressure (!) 143/71, pulse (!) 50, temperature 97.9 F (36.6 C), height 5\' 2"  (1.575 m), weight 191 lb (86.6 kg), SpO2 99%. Body mass index is 34.93 kg/m.  General: Well Developed, well nourished, and in no acute distress.  HEENT: Normocephalic, atraumatic Skin: Warm and dry, cap RF less 2 sec, good turgor Chest:  Normal excursion, shape, no gross abn Respiratory: speaking in full sentences, no conversational dyspnea NeuroM-Sk: Ambulates w/o assistance, moves * 4 Psych: A and O *3, insight good, mood-full  DIAGNOSTIC DATA REVIEWED:  BMET    Component Value Date/Time   NA 141 10/07/2022 1059   K 4.8 10/07/2022 1059   CL 105 10/07/2022 1059   CO2 22 10/07/2022 1059   GLUCOSE 230 (H) 10/07/2022 1059   GLUCOSE 201 (H) 08/06/2022 0809   BUN 25 10/07/2022  1059   CREATININE 1.24 (H) 10/07/2022 1059   CREATININE 0.74 08/30/2011 1307   CALCIUM 9.5 10/07/2022 1059   GFRNONAA 57 (L) 08/06/2022 0809   GFRAA 57 (L) 02/21/2020 1500   Lab Results  Component Value Date   HGBA1C 9.1 (H) 10/07/2022   HGBA1C (H) 09/12/2007    9.4 (NOTE)   The ADA recommends the following therapeutic goal for glycemic   control related to Hgb A1C measurement:   Goal of Therapy:   < 7.0% Hgb A1C   Reference: American Diabetes Association: Clinical Practice   Recommendations 2008, Diabetes Care,  2008, 31:(Suppl 1).   No results found for: "INSULIN" Lab Results  Component Value Date   TSH 1.640 06/17/2021   CBC    Component Value Date/Time   WBC 6.2 08/06/2022 0809    RBC 3.79 (L) 08/06/2022 0809   HGB 10.7 (L) 08/06/2022 0809   HGB 13.2 03/23/2022 1032   HCT 33.4 (L) 08/06/2022 0809   HCT 39.7 03/23/2022 1032   PLT 164 08/06/2022 0809   PLT 211 03/23/2022 1032   MCV 88.1 08/06/2022 0809   MCV 87 03/23/2022 1032   MCH 28.2 08/06/2022 0809   MCHC 32.0 08/06/2022 0809   RDW 15.1 08/06/2022 0809   RDW 13.1 03/23/2022 1032   Iron Studies    Component Value Date/Time   IRON 68 11/16/2017 0747   TIBC 355 11/16/2017 0747   FERRITIN 45.2 11/16/2017 0747   IRONPCTSAT 19 11/16/2017 0747   Lipid Panel     Component Value Date/Time   CHOL 206 (H) 07/05/2022 0946   TRIG 52 07/05/2022 0946   HDL 109 07/05/2022 0946   CHOLHDL 1.9 07/05/2022 0946   CHOLHDL 2.8 09/13/2007 0405   VLDL 17 09/13/2007 0405   LDLCALC 87 07/05/2022 0946   Hepatic Function Panel     Component Value Date/Time   PROT 7.0 10/07/2022 1059   ALBUMIN 4.2 10/07/2022 1059   AST 21 10/07/2022 1059   ALT 21 10/07/2022 1059   ALKPHOS 114 10/07/2022 1059   BILITOT <0.2 10/07/2022 1059   BILIDIR 0.1 09/06/2019 2304   BILIDIR 0.09 11/08/2018 1017   IBILI 0.4 09/06/2019 2304      Component Value Date/Time   TSH 1.640 06/17/2021 1606   Nutritional Lab Results  Component Value Date   VD25OH 32.5 07/05/2022   VD25OH 48.3 09/14/2021   VD25OH 82.1 03/31/2021    Attestations:   I, Special Puri , acting as a Stage manager for [employer], DO., have compiled all relevant documentation for today's office visit on behalf of Thomasene Lot, DO, while in the presence of [employer], DO.  I have reviewed the above documentation for accuracy and completeness, and I agree with the above. Leslie Davis, D.O.  The 21st Century Cures Act was signed into law in 2016 which includes the topic of electronic health records.  This provides immediate access to information in MyChart.  This includes consultation notes, operative notes, office notes, lab results and pathology reports.   If you have any questions about what you read please let us know at your next visit so we can discuss your concerns and take corrective action if need be.  We are right here with you.

## 2022-10-25 NOTE — Telephone Encounter (Signed)
I attempted to contact Leslie Davis regarding upcoming Endo appt. Leslie Davis currently on Ozempic due to DM. Temporarily stopped due to abdominal pain. Found to have abdominal mass, it was thought this was causing her discomfort.  Therefore, she resumed the Ozempic due to weight gain and increasing blood sugars.  Pathology pos for neuroendocrine tumor.  Leslie Davis advised to hold Ozempic pending further workup. Must be further evaluated for pheochromocytoma vs. Carcinoid, etc. Will check calcitonin levels, unless her Endo appt is within the next two weeks. Endo will call patient directly to schedule the appt.   Will attempt to call Leslie Davis again later today.  RS

## 2022-10-27 ENCOUNTER — Other Ambulatory Visit: Payer: Self-pay | Admitting: Surgery

## 2022-10-27 DIAGNOSIS — C7A8 Other malignant neuroendocrine tumors: Secondary | ICD-10-CM

## 2022-11-04 ENCOUNTER — Telehealth: Payer: Self-pay | Admitting: Pharmacist

## 2022-11-04 ENCOUNTER — Ambulatory Visit: Payer: 59 | Admitting: Physician Assistant

## 2022-11-04 DIAGNOSIS — K3184 Gastroparesis: Secondary | ICD-10-CM | POA: Diagnosis not present

## 2022-11-04 DIAGNOSIS — E1165 Type 2 diabetes mellitus with hyperglycemia: Secondary | ICD-10-CM | POA: Diagnosis not present

## 2022-11-04 DIAGNOSIS — N183 Chronic kidney disease, stage 3 unspecified: Secondary | ICD-10-CM | POA: Diagnosis not present

## 2022-11-04 DIAGNOSIS — C7A8 Other malignant neuroendocrine tumors: Secondary | ICD-10-CM | POA: Diagnosis not present

## 2022-11-04 DIAGNOSIS — E1143 Type 2 diabetes mellitus with diabetic autonomic (poly)neuropathy: Secondary | ICD-10-CM | POA: Diagnosis not present

## 2022-11-04 DIAGNOSIS — I10 Essential (primary) hypertension: Secondary | ICD-10-CM | POA: Diagnosis not present

## 2022-11-04 DIAGNOSIS — E782 Mixed hyperlipidemia: Secondary | ICD-10-CM | POA: Diagnosis not present

## 2022-11-04 DIAGNOSIS — I2510 Atherosclerotic heart disease of native coronary artery without angina pectoris: Secondary | ICD-10-CM | POA: Diagnosis not present

## 2022-11-04 NOTE — Progress Notes (Signed)
Pharmacy Quality Measure Review  This patient is appearing on a report for being at risk of failing the Glycemic Status Assessment in Diabetes measure this calendar year.    Last documented A1c or GMI 9.1 on 7/18.   Will collaborate with PCP to manage DM.

## 2022-11-08 ENCOUNTER — Other Ambulatory Visit: Payer: Self-pay | Admitting: Internal Medicine

## 2022-11-08 DIAGNOSIS — Z794 Long term (current) use of insulin: Secondary | ICD-10-CM

## 2022-11-08 NOTE — Progress Notes (Signed)
I spoke with Dr Mosetta Putt regarding referral we received from Dr Talmage Nap for this patient.  Per dr Mosetta Putt there is no role for medical oncology in this patient's care at this time.  I spoke with Ms Nissley and explained this. All questions were answered.  She verbalized understanding. I left a message for the referral coordinator at Dr Willeen Cass office.  I provided my direct phone number should they have questions.

## 2022-11-09 ENCOUNTER — Other Ambulatory Visit: Payer: Self-pay | Admitting: Internal Medicine

## 2022-11-09 DIAGNOSIS — Z794 Long term (current) use of insulin: Secondary | ICD-10-CM

## 2022-11-11 ENCOUNTER — Ambulatory Visit
Admission: RE | Admit: 2022-11-11 | Discharge: 2022-11-11 | Disposition: A | Discharge status: Home / Self Care | Payer: 59 | Source: Ambulatory Visit | Attending: Surgery | Admitting: Surgery

## 2022-11-11 DIAGNOSIS — C7A8 Other malignant neuroendocrine tumors: Secondary | ICD-10-CM | POA: Diagnosis not present

## 2022-11-11 MED ORDER — IOPAMIDOL (ISOVUE-300) INJECTION 61%
200.0000 mL | Freq: Once | INTRAVENOUS | Status: AC | PRN
  Administered 2022-11-11: 100 mL via INTRAVENOUS

## 2022-11-16 ENCOUNTER — Other Ambulatory Visit: Payer: Self-pay | Admitting: Physician Assistant

## 2022-11-16 ENCOUNTER — Other Ambulatory Visit (INDEPENDENT_AMBULATORY_CARE_PROVIDER_SITE_OTHER): Payer: Self-pay | Admitting: Family Medicine

## 2022-11-16 ENCOUNTER — Other Ambulatory Visit: Payer: Self-pay | Admitting: Internal Medicine

## 2022-11-16 DIAGNOSIS — K5904 Chronic idiopathic constipation: Secondary | ICD-10-CM

## 2022-11-16 DIAGNOSIS — G4700 Insomnia, unspecified: Secondary | ICD-10-CM

## 2022-11-18 ENCOUNTER — Ambulatory Visit (INDEPENDENT_AMBULATORY_CARE_PROVIDER_SITE_OTHER): Payer: 59 | Admitting: Family Medicine

## 2022-11-19 ENCOUNTER — Other Ambulatory Visit: Payer: Self-pay | Admitting: Internal Medicine

## 2022-11-19 DIAGNOSIS — K2970 Gastritis, unspecified, without bleeding: Secondary | ICD-10-CM

## 2022-11-25 ENCOUNTER — Other Ambulatory Visit (HOSPITAL_COMMUNITY): Payer: Self-pay | Admitting: Surgery

## 2022-11-25 DIAGNOSIS — C7A8 Other malignant neuroendocrine tumors: Secondary | ICD-10-CM

## 2022-11-26 ENCOUNTER — Ambulatory Visit (INDEPENDENT_AMBULATORY_CARE_PROVIDER_SITE_OTHER): Payer: 59 | Admitting: Orthopaedic Surgery

## 2022-11-26 ENCOUNTER — Encounter: Payer: Self-pay | Admitting: Orthopaedic Surgery

## 2022-11-26 ENCOUNTER — Other Ambulatory Visit (INDEPENDENT_AMBULATORY_CARE_PROVIDER_SITE_OTHER): Payer: 59

## 2022-11-26 DIAGNOSIS — Z96652 Presence of left artificial knee joint: Secondary | ICD-10-CM | POA: Diagnosis not present

## 2022-11-26 NOTE — Progress Notes (Signed)
Office Visit Note   Patient: Leslie Davis           Date of Birth: Sep 11, 1955           MRN: [id number] Visit Date: 11/26/2022              Requested by: Dorothyann Peng, MD [address] [place],  [place] [address] PCP: Dorothyann Peng, MD   Assessment & Plan: Visit Diagnoses:  1. Status post total left knee replacement     Plan: Leslie Davis is now 2 years status post left total knee replacement.  She has had continued pain despite physical therapy.  She has had blood work in the past that was negative for infection.  I aspirated her knee today and obtain 11 cc of synovial fluid which we will send for [employer].  We will notify her of the results and go from there.  Follow-Up Instructions: No follow-ups on file.   Orders:  Orders Placed This Encounter  Procedures   XR Knee 1-2 Views Left   No orders of the defined types were placed in this encounter.     Procedures: Large Joint Inj: L knee on 11/26/2022 9:24 AM Details: 22 G needle Outcome: tolerated well, no immediate complications Patient was prepped and draped in the usual sterile fashion.       Clinical Data: No additional findings.   Subjective: Chief Complaint  Patient presents with   Left Knee - Pain    S/p left total knee arthroplasty 10/27/2020    HPI Leslie Davis returns today for chronic left knee pain.  Reports that the pain has not improved with home exercise program. Review of Systems   Objective: Vital Signs: There were no vitals taken for this visit.  Physical Exam  Ortho Exam Examination left knee shows fully healed surgical scar.  Small joint effusion.  No signs of infection. Specialty Comments:  No specialty comments available.  Imaging: XR Knee 1-2 Views Left  Result Date: 11/26/2022 X-rays show status post left total knee replacement.  Questionable lucency and loosening of the femoral component.  The tibial component appears to be well-fixed without loosening or  subsidence.    PMFS History: Patient Active Problem List   Diagnosis Date Noted   Left thigh pain 08/19/2022   Mesenteric mass 08/05/2022   Small bowel mass 08/05/2022   Acute cough 05/02/2022   Nasal congestion 05/02/2022   Type 2 diabetes mellitus with obesity (HCC) 03/24/2022   Generalized obesity 03/24/2022   Atherosclerosis of aorta (HCC) 03/23/2022   Coronary artery disease of native artery of native heart with stable angina pectoris (HCC)    Other constipation 05/25/2021   Class 2 severe obesity due to excess calories with serious comorbidity and body mass index (BMI) of 35.0 to 35.9 in adult [facility]) 12/01/2020   Status post total left knee replacement 10/27/2020   Primary osteoarthritis of left knee    NAFLD (nonalcoholic fatty liver disease) 13/24/4010   Other fatigue 07/23/2020   SOBOE (shortness of breath on exertion) 07/23/2020   Diabetes mellitus (HCC) 07/23/2020   Hypertension associated with type 2 diabetes mellitus (HCC) 07/23/2020   Hyperlipidemia associated with type 2 diabetes mellitus (HCC) 07/23/2020   Absolute anemia 07/23/2020   Vitamin D deficiency 07/23/2020   At risk for heart disease 07/23/2020   AKI (acute kidney injury) (HCC) 09/07/2019   Acute kidney injury (HCC) 09/06/2019   Bradycardia 09/06/2019   Hyperglycemia 09/06/2019   HLD (hyperlipidemia)    Hypertensive  heart and renal disease 05/16/2019   Erosive gastritis 11/16/2017   Fatty liver 11/16/2017   Abdominal pain 12/11/2012   Early satiety 12/11/2012   Hiatal hernia    Hx of cholecystectomy 02/05/2011   Flu-like symptoms 02/05/2011   CONSTIPATION 08/27/2008   FATTY LIVER DISEASE 05/07/2008   Nausea with vomiting 04/23/2008   EPIGASTRIC PAIN 04/23/2008   RENAL CALCULUS 04/22/2008   Type 2 diabetes mellitus with stage 2 chronic kidney disease, with long-term current use of insulin (HCC) 05/19/2006   Type 2 diabetes mellitus with hyperlipidemia (HCC) 05/19/2006   HYPERTENSION, BENIGN  SYSTEMIC 05/19/2006   GASTROESOPHAGEAL REFLUX, NO ESOPHAGITIS 05/19/2006   AMENORRHEA 05/19/2006   Past Medical History:  Diagnosis Date   CKD (chronic kidney disease)    stage 3   Coronary artery disease 07/2021   obstructive CAD-70% LAD-treated medically   DM (diabetes mellitus) (HCC)    Fatty liver disease, nonalcoholic    Gastritis    Gastroparesis    GERD (gastroesophageal reflux disease)    Heartburn    Hiatal hernia    HLD (hyperlipidemia)    HTN (hypertension)    Joint pain    Renal calculus    Rheumatoid arthritis (HCC)    Vitamin D deficiency     Family History  Problem Relation Age of Onset   Diabetes Mother    Thyroid disease Mother    Kidney disease Mother    Hypertension Father    Hyperlipidemia Father    Sudden death Father    Stroke Father    Diabetes Sister    Heart disease Sister    Kidney disease Sister    COPD Sister    Heart disease Brother 84   Diabetes Brother    Stomach cancer Maternal Aunt        X 2 aunts   Diabetes Maternal Grandmother    Colon cancer Neg Hx    Esophageal cancer Neg Hx    Rectal cancer Neg Hx    Colon polyps Neg Hx     Past Surgical History:  Procedure Laterality Date   BREAST BIOPSY  02/04/2022   MM LT RADIOACTIVE SEED EA ADD LESION LOC MAMMO GUIDE 02/04/2022 [facility]   BREAST BIOPSY  02/04/2022   MM LT RADIOACTIVE SEED LOC MAMMO GUIDE 02/04/2022 [facility]   BREAST LUMPECTOMY WITH RADIOACTIVE SEED LOCALIZATION Left 02/05/2022   Procedure: LEFT BREAST BRACKETED LUMPECTOMY WITH RADIOACTIVE SEED LOCALIZATION;  Surgeon: Griselda Miner, MD;  Location: [facility];  Service: General;  Laterality: Left;   CHOLECYSTECTOMY     COLONOSCOPY  09/10/2011   normal   JOINT REPLACEMENT  2022   left TKA   LAPAROSCOPIC REMOVAL ABDOMINAL MASS N/A 08/05/2022   Procedure: LAPAROSCOPIC ASSISTED EXCISION OF MESENTERIC MASS;  Surgeon: Fritzi Mandes, MD;  Location: [facility] OR;  Service: General;  Laterality:  N/A;   LAPAROSCOPIC SMALL BOWEL RESECTION N/A 08/05/2022   Procedure: LAPAROSCOPIC SMALL BOWEL RESECTION;  Surgeon: Fritzi Mandes, MD;  Location: [facility] OR;  Service: General;  Laterality: N/A;   LEFT HEART CATH AND CORONARY ANGIOGRAPHY N/A 08/18/2021   Procedure: LEFT HEART CATH AND CORONARY ANGIOGRAPHY;  Surgeon: Elder Negus, MD;  Location: [facility];  Service: Cardiovascular;  Laterality: N/A;   TOTAL KNEE ARTHROPLASTY Left 10/27/2020   Procedure: LEFT TOTAL KNEE ARTHROPLASTY;  Surgeon: Tarry Kos, MD;  Location: [facility] OR;  Service: Orthopedics;  Laterality: Left;   TUBAL LIGATION     UPPER  GASTROINTESTINAL ENDOSCOPY     Social History   Occupational History   Occupation: Housekeeping    Comment: Education officer, environmental  Tobacco Use   Smoking status: Former    Current packs/day: 0.00    Average packs/day: 0.3 packs/day for 1 year (0.3 ttl pk-yrs)    Types: Cigarettes    Start date: 09/09/1973    Quit date: 09/10/1974    Years since quitting: 48.2   Smokeless tobacco: Never   Tobacco comments:    she no longer smokes.   Vaping Use   Vaping status: Never Used  Substance and Sexual Activity   Alcohol use: Never   Drug use: Never   Sexual activity: Not Currently    Birth control/protection: Post-menopausal

## 2022-12-02 ENCOUNTER — Ambulatory Visit (INDEPENDENT_AMBULATORY_CARE_PROVIDER_SITE_OTHER): Payer: 59 | Admitting: Family Medicine

## 2022-12-02 DIAGNOSIS — E782 Mixed hyperlipidemia: Secondary | ICD-10-CM | POA: Diagnosis not present

## 2022-12-02 DIAGNOSIS — K3184 Gastroparesis: Secondary | ICD-10-CM | POA: Diagnosis not present

## 2022-12-02 DIAGNOSIS — E1143 Type 2 diabetes mellitus with diabetic autonomic (poly)neuropathy: Secondary | ICD-10-CM | POA: Diagnosis not present

## 2022-12-02 DIAGNOSIS — I10 Essential (primary) hypertension: Secondary | ICD-10-CM | POA: Diagnosis not present

## 2022-12-02 DIAGNOSIS — E1165 Type 2 diabetes mellitus with hyperglycemia: Secondary | ICD-10-CM | POA: Diagnosis not present

## 2022-12-06 ENCOUNTER — Telehealth: Payer: Self-pay | Admitting: Orthopaedic Surgery

## 2022-12-06 MED ORDER — TRAMADOL HCL 50 MG PO TABS: 50.0000 mg | ORAL_TABLET | Freq: Every day | ORAL | 0 refills | Status: DC | PRN

## 2022-12-06 NOTE — Telephone Encounter (Signed)
All normal.  No signs of infection

## 2022-12-06 NOTE — Telephone Encounter (Signed)
Called patient. She wants something for pain. She is taking celebrex and that does not help.

## 2022-12-06 NOTE — Telephone Encounter (Signed)
Patient called. Would like to know the results of her test results. Her call back number is [phone]

## 2022-12-06 NOTE — Telephone Encounter (Signed)
Called and notified patient.

## 2022-12-06 NOTE — Telephone Encounter (Signed)
I sent tramadol

## 2022-12-07 ENCOUNTER — Ambulatory Visit (HOSPITAL_COMMUNITY)
Admission: RE | Admit: 2022-12-07 | Discharge: 2022-12-07 | Disposition: A | Discharge status: Home / Self Care | Payer: 59 | Source: Ambulatory Visit | Attending: Surgery | Admitting: Surgery

## 2022-12-07 DIAGNOSIS — C7A8 Other malignant neuroendocrine tumors: Secondary | ICD-10-CM | POA: Insufficient documentation

## 2022-12-07 MED ORDER — COPPER CU 64 DOTATATE 1 MCI/ML IV SOLN
4.0000 | Freq: Once | INTRAVENOUS | Status: AC
  Administered 2022-12-07: 4 via INTRAVENOUS

## 2022-12-08 ENCOUNTER — Encounter: Payer: 59 | Admitting: Internal Medicine

## 2022-12-08 DIAGNOSIS — I1310 Hypertensive heart and chronic kidney disease without heart failure, with stage 1 through stage 4 chronic kidney disease, or unspecified chronic kidney disease: Secondary | ICD-10-CM

## 2022-12-08 DIAGNOSIS — E1169 Type 2 diabetes mellitus with other specified complication: Secondary | ICD-10-CM

## 2022-12-08 DIAGNOSIS — Z0000 Encounter for general adult medical examination without abnormal findings: Secondary | ICD-10-CM

## 2022-12-08 DIAGNOSIS — I700 Atherosclerosis of aorta: Secondary | ICD-10-CM

## 2022-12-08 NOTE — Progress Notes (Deleted)
I,Keeghan Mcintire T Deloria Lair, CMA,acting as a Neurosurgeon for Gwynneth Aliment, MD.,have documented all relevant documentation on the behalf of Gwynneth Aliment, MD,as directed by  Gwynneth Aliment, MD while in the presence of Gwynneth Aliment, MD.  Subjective:    Patient ID: Leslie Davis , female    DOB: 03-01-1956 , 67 y.o.   MRN: [id number]  No chief complaint on file.   HPI  Patient here for annual exam. Patient is followed by GYN, Dr. [place] for her pelvic exams. She reports compliance with meds. She denies headaches, chest pain and shortness of breath.    Diabetes She presents for her follow-up diabetic visit. She has type 2 diabetes mellitus. Her disease course has been stable. There are no hypoglycemic associated symptoms. Pertinent negatives for diabetes include no blurred vision. There are no hypoglycemic complications. Diabetic complications include nephropathy. Risk factors for coronary artery disease include diabetes mellitus, dyslipidemia, hypertension and post-menopausal.  Hypertension This is a chronic problem. The current episode started more than 1 year ago. The problem has been gradually improving since onset. Pertinent negatives include no blurred vision. Risk factors for coronary artery disease include dyslipidemia and diabetes mellitus. The current treatment provides moderate improvement.     Past Medical History:  Diagnosis Date   CKD (chronic kidney disease)    stage 3   Coronary artery disease 07/2021   obstructive CAD-70% LAD-treated medically   DM (diabetes mellitus) (HCC)    Fatty liver disease, nonalcoholic    Gastritis    Gastroparesis    GERD (gastroesophageal reflux disease)    Heartburn    Hiatal hernia    HLD (hyperlipidemia)    HTN (hypertension)    Joint pain    Renal calculus    Rheumatoid arthritis (HCC)    Vitamin D deficiency      Family History  Problem Relation Age of Onset   Diabetes Mother    Thyroid disease Mother    Kidney disease Mother     Hypertension Father    Hyperlipidemia Father    Sudden death Father    Stroke Father    Diabetes Sister    Heart disease Sister    Kidney disease Sister    COPD Sister    Heart disease Brother 15   Diabetes Brother    Stomach cancer Maternal Aunt        X 2 aunts   Diabetes Maternal Grandmother    Colon cancer Neg Hx    Esophageal cancer Neg Hx    Rectal cancer Neg Hx    Colon polyps Neg Hx      Current Outpatient Medications:    acetaminophen (TYLENOL) 500 MG tablet, Take 1 tablet (500 mg total) by mouth every 6 (six) hours as needed for mild pain., Disp: 30 tablet, Rfl: 0   ALLERGY RELIEF CETIRIZINE 10 MG tablet, TAKE ONE TABLET BY MOUTH ONCE DAILY, Disp: 30 tablet, Rfl: 2   amLODipine (NORVASC) 10 MG tablet, TAKE ONE TABLET BY MOUTH ONCE DAILY, Disp: 90 tablet, Rfl: 3   aspirin EC (ASPIRIN LOW DOSE) 81 MG tablet, TAKE 1 TABLET BY MOUTH ONCE DAILY *SWALLOW WHOLE* *REFILL REQUEST*, Disp: 90 tablet, Rfl: 1   Blood Glucose Monitoring Suppl (ONETOUCH VERIO) w/Device KIT, Use as directed to check blood sugars 2 times per day dx: e11.65, Disp: 1 kit, Rfl: 1   carvedilol (COREG) 3.125 MG tablet, TAKE ONE TABLET BY MOUTH TWICE DAILY WITH A MEAL, Disp: 180 tablet, Rfl: 1  celecoxib (CELEBREX) 200 MG capsule, Take 1 capsule (200 mg total) by mouth 2 (two) times daily as needed., Disp: 180 capsule, Rfl: 2   clindamycin (CLEOCIN T) 1 % lotion, Apply 1 Application topically daily as needed (acne)., Disp: , Rfl:    docusate sodium (COLACE) 100 MG capsule, Take 1 capsule (100 mg total) by mouth 2 (two) times daily., Disp: 10 capsule, Rfl: 0   EDARBI 80 MG TABS, TAKE ONE TABLET BY MOUTH ONCE DAILY, Disp: 90 tablet, Rfl: 2   ezetimibe (ZETIA) 10 MG tablet, Take 1 tablet (10 mg total) by mouth daily., Disp: 90 tablet, Rfl: 3   FARXIGA 10 MG TABS tablet, TAKE ONE TABLET BY MOUTH BEFORE BREAKFAST daily, Disp: 90 tablet, Rfl: 2   ferrous gluconate (FERGON) 324 MG tablet, TAKE 1 TABLET BY MOUTH  TWICE DAILY WITH A MEAL., Disp: 100 tablet, Rfl: 0   glucose blood (ONETOUCH VERIO) test strip, USE TO check blood glucose TWICE DAILY AS DIRECTED, Disp: 150 strip, Rfl: 11   insulin lispro (HUMALOG) 100 UNIT/ML KwikPen, Inject 0-6 Units into the skin 3 (three) times daily with meals. Check Blood Glucose (BG) and inject per scale: BG <150= 0 unit; BG 150-200= 1 unit; BG 201-250= 2 unit; BG 251-300= 3 unit; BG 301-350= 4 unit; BG 351-400= 5 unit; BG >400= 6 unit and Call Primary Care., Disp: 15 mL, Rfl: 0   Insulin Pen Needle (PEN NEEDLES) 31G X 5 MM MISC, 1 each by Does not apply route 3 (three) times daily. May dispense any manufacturer covered by patient's insurance., Disp: 100 each, Rfl: 0   linaclotide ([name]) 145 MCG CAPS capsule, Take 1 capsule (145 mcg total) by mouth daily., Disp: 30 capsule, Rfl: 0   nitroGLYCERIN (NITROSTAT) 0.4 MG SL tablet, Place 1 tablet (0.4 mg total) under the tongue every 5 (five) minutes as needed for chest pain. If you require more than two tablets five minutes apart go to the nearest ER via EMS., Disp: 30 tablet, Rfl: 0   ondansetron (ZOFRAN) 4 MG tablet, TAKE 1 TABLET BY MOUTH EVERY 8 HOURS AS NEEDED FOR NAUSEA & VOMITING, Disp: 20 tablet, Rfl: 1   OneTouch Delica Lancets 33G MISC, Use as directed to check blood sugars 2 times per day dx:e11.65, Disp: 150 each, Rfl: 3   OZEMPIC, 2 MG/DOSE, 8 MG/3ML SOPN, INJECT 2MG  SUBCUTANEOUSLY ONCE WEEKLY AS DIRECTED *REFILL REQUEST*, Disp: 3 mL, Rfl: 10   pantoprazole (PROTONIX) 40 MG tablet, Take 1 tablet (40 mg total) by mouth 2 (two) times daily., Disp: 60 tablet, Rfl: 3   polyethylene glycol (MIRALAX / GLYCOLAX) 17 g packet, Take 17 g by mouth daily as needed for mild constipation., Disp: 14 each, Rfl: 0   rosuvastatin (CRESTOR) 20 MG tablet, TAKE ONE TABLET BY MOUTH ONCE DAILY, Disp: 90 tablet, Rfl: 2   Semaglutide,0.25 or 0.5MG /DOS, 2 MG/3ML SOPN, Inject 0.5 mg sq weekly, Disp: 9 mL, Rfl: 1   temazepam (RESTORIL) 30 MG  capsule, TAKE 1 CAPSULE BY MOUTH AT BEDTIME AS NEEDED *REFILL REQUEST*, Disp: 30 capsule, Rfl: 0   traMADol (ULTRAM) 50 MG tablet, Take 1-2 tablets (50-100 mg total) by mouth daily as needed., Disp: 20 tablet, Rfl: 0   TRUEPLUS PEN NEEDLES 31G X 6 MM MISC, USE WITH pen TO INJECT insulin DAILY, Disp: 150 each, Rfl: 3   Allergies  Allergen Reactions   Fiasp [Insulin Aspart (W-Niacinamide)] Hives      The patient states she uses {contraceptive methods:5051} for birth  control. No LMP recorded. Patient is postmenopausal.. {Dysmenorrhea-menorrhagia:21918}. Negative for: breast discharge, breast lump(s), breast pain and breast self exam. Associated symptoms include abnormal vaginal bleeding. Pertinent negatives include abnormal bleeding (hematology), anxiety, decreased libido, depression, difficulty falling sleep, dyspareunia, history of infertility, nocturia, sexual dysfunction, sleep disturbances, urinary incontinence, urinary urgency, vaginal discharge and vaginal itching. Diet regular.The patient states her exercise level is    . The patient's tobacco use is:  Social History   Tobacco Use  Smoking Status Former   Current packs/day: 0.00   Average packs/day: 0.3 packs/day for 1 year (0.3 ttl pk-yrs)   Types: Cigarettes   Start date: 09/09/1973   Quit date: 09/10/1974   Years since quitting: 48.2  Smokeless Tobacco Never  Tobacco Comments   she no longer smokes.   . She has been exposed to passive smoke. The patient's alcohol use is:  Social History   Substance and Sexual Activity  Alcohol Use Never  . Additional information: Last pap ***, next one scheduled for ***.    Review of Systems  Constitutional: Negative.   HENT: Negative.    Eyes: Negative.  Negative for blurred vision.  Respiratory: Negative.    Cardiovascular: Negative.   Gastrointestinal: Negative.   Endocrine: Negative.   Genitourinary: Negative.   Musculoskeletal: Negative.   Skin: Negative.   Allergic/Immunologic:  Negative.   Neurological: Negative.   Hematological: Negative.   Psychiatric/Behavioral: Negative.       There were no vitals filed for this visit. There is no height or weight on file to calculate BMI.  Wt Readings from Last 3 Encounters:  10/25/22 191 lb (86.6 kg)  10/07/22 191 lb 9.6 oz (86.9 kg)  08/26/22 189 lb (85.7 kg)     Objective:  Physical Exam      Assessment And Plan:     Annual physical exam  Type 2 diabetes mellitus with other circulatory complication, with long-term current use of insulin (HCC)  Hypertensive heart and renal disease with renal failure, stage 1 through stage 4 or unspecified chronic kidney disease, without heart failure  Hyperlipidemia associated with type 2 diabetes mellitus (HCC)  Atherosclerosis of aorta (HCC)     No follow-ups on file. Patient was given opportunity to ask questions. Patient verbalized understanding of the plan and was able to repeat key elements of the plan. All questions were answered to their satisfaction.   Gwynneth Aliment, MD  I, Gwynneth Aliment, MD, have reviewed all documentation for this visit. The documentation on 12/08/22 for the exam, diagnosis, procedures, and orders are all accurate and complete.

## 2022-12-22 ENCOUNTER — Other Ambulatory Visit: Payer: Self-pay | Admitting: Internal Medicine

## 2022-12-23 ENCOUNTER — Ambulatory Visit (INDEPENDENT_AMBULATORY_CARE_PROVIDER_SITE_OTHER): Payer: 59

## 2022-12-23 DIAGNOSIS — Z0000 Encounter for general adult medical examination without abnormal findings: Secondary | ICD-10-CM

## 2022-12-23 NOTE — Progress Notes (Signed)
Subjective:   Leslie Davis is a 67 y.o. female who presents for Medicare Annual (Subsequent) preventive examination.  Visit Complete: Virtual  I connected with  Leslie Davis on 12/23/22 by a audio enabled telemedicine application and verified that I am speaking with the correct person using two identifiers.  Patient Location: Home  Provider Location: Office/Clinic  I discussed the limitations of evaluation and management by telemedicine. The patient expressed understanding and agreed to proceed.  Because this visit was a virtual/telehealth visit, some criteria may be missing or patient reported. Any vitals not documented were not able to be obtained and vitals that have been documented are patient reported.      Cardiac Risk Factors include: advanced age (>48men, >64 women);diabetes mellitus;dyslipidemia;hypertension     Objective:    Today's Vitals   12/23/22 1211  PainSc: 9    There is no height or weight on file to calculate BMI.     12/23/2022   12:19 PM 07/28/2022    9:38 AM 02/05/2022   11:28 AM 01/29/2022   12:51 PM 12/31/2021    9:27 AM 12/19/2021    1:38 PM 08/18/2021    1:03 PM  Advanced Directives  Does Patient Have a Medical Advance Directive? No No No No No No No  Would patient like information on creating a medical advance directive?  No - Patient declined No - Patient declined No - Patient declined No - Patient declined Yes (ED - Information included in AVS) No - Patient declined    Current Medications (verified) Outpatient Encounter Medications as of 12/23/2022  Medication Sig   acetaminophen (TYLENOL) 500 MG tablet Take 1 tablet (500 mg total) by mouth every 6 (six) hours as needed for mild pain.   ALLERGY RELIEF CETIRIZINE 10 MG tablet TAKE 1 TABLET BY MOUTH ONCE DAILY   amLODipine (NORVASC) 10 MG tablet TAKE ONE TABLET BY MOUTH ONCE DAILY   aspirin EC (ASPIRIN LOW DOSE) 81 MG tablet TAKE 1 TABLET BY MOUTH ONCE DAILY *SWALLOW WHOLE* *REFILL  REQUEST*   Blood Glucose Monitoring Suppl (ONETOUCH VERIO) w/Device KIT Use as directed to check blood sugars 2 times per day dx: e11.65   carvedilol (COREG) 3.125 MG tablet TAKE ONE TABLET BY MOUTH TWICE DAILY WITH A MEAL   celecoxib (CELEBREX) 200 MG capsule Take 1 capsule (200 mg total) by mouth 2 (two) times daily as needed.   clindamycin (CLEOCIN T) 1 % lotion Apply 1 Application topically daily as needed (acne).   docusate sodium (COLACE) 100 MG capsule Take 1 capsule (100 mg total) by mouth 2 (two) times daily.   EDARBI 80 MG TABS TAKE ONE TABLET BY MOUTH ONCE DAILY   ezetimibe (ZETIA) 10 MG tablet Take 1 tablet (10 mg total) by mouth daily.   FARXIGA 10 MG TABS tablet TAKE ONE TABLET BY MOUTH BEFORE BREAKFAST daily   ferrous gluconate (FERGON) 324 MG tablet TAKE 1 TABLET BY MOUTH TWICE DAILY WITH A MEAL.   glucose blood (ONETOUCH VERIO) test strip USE TO check blood glucose TWICE DAILY AS DIRECTED   insulin lispro (HUMALOG) 100 UNIT/ML KwikPen Inject 0-6 Units into the skin 3 (three) times daily with meals. Check Blood Glucose (BG) and inject per scale: BG <150= 0 unit; BG 150-200= 1 unit; BG 201-250= 2 unit; BG 251-300= 3 unit; BG 301-350= 4 unit; BG 351-400= 5 unit; BG >400= 6 unit and Call Primary Care.   Insulin Pen Needle (PEN NEEDLES) 31G X 5 MM MISC 1  each by Does not apply route 3 (three) times daily. May dispense any manufacturer covered by patient's insurance.   linaclotide ([name]) 145 MCG CAPS capsule Take 1 capsule (145 mcg total) by mouth daily.   ondansetron (ZOFRAN) 4 MG tablet TAKE 1 TABLET BY MOUTH EVERY 8 HOURS AS NEEDED FOR NAUSEA & VOMITING   OneTouch Delica Lancets 33G MISC Use as directed to check blood sugars 2 times per day dx:e11.65   pantoprazole (PROTONIX) 40 MG tablet Take 1 tablet (40 mg total) by mouth 2 (two) times daily.   polyethylene glycol (MIRALAX / GLYCOLAX) 17 g packet Take 17 g by mouth daily as needed for mild constipation.   rosuvastatin (CRESTOR)  20 MG tablet TAKE ONE TABLET BY MOUTH ONCE DAILY   temazepam (RESTORIL) 30 MG capsule TAKE 1 CAPSULE BY MOUTH AT BEDTIME AS NEEDED *REFILL REQUEST*   traMADol (ULTRAM) 50 MG tablet Take 1-2 tablets (50-100 mg total) by mouth daily as needed.   TRUEPLUS PEN NEEDLES 31G X 6 MM MISC USE WITH pen TO INJECT insulin DAILY   nitroGLYCERIN (NITROSTAT) 0.4 MG SL tablet Place 1 tablet (0.4 mg total) under the tongue every 5 (five) minutes as needed for chest pain. If you require more than two tablets five minutes apart go to the nearest ER via EMS.   OZEMPIC, 2 MG/DOSE, 8 MG/3ML SOPN INJECT 2MG  SUBCUTANEOUSLY ONCE WEEKLY AS DIRECTED *REFILL REQUEST* (Patient not taking: Reported on 12/23/2022)   Semaglutide,0.25 or 0.5MG /DOS, 2 MG/3ML SOPN Inject 0.5 mg sq weekly (Patient not taking: Reported on 12/23/2022)   No facility-administered encounter medications on file as of 12/23/2022.    Allergies (verified) Fiasp [insulin aspart (w-niacinamide)]   History: Past Medical History:  Diagnosis Date   CKD (chronic kidney disease)    stage 3   Coronary artery disease 07/2021   obstructive CAD-70% LAD-treated medically   DM (diabetes mellitus) (HCC)    Fatty liver disease, nonalcoholic    Gastritis    Gastroparesis    GERD (gastroesophageal reflux disease)    Heartburn    Hiatal hernia    HLD (hyperlipidemia)    HTN (hypertension)    Joint pain    Renal calculus    Rheumatoid arthritis (HCC)    Vitamin D deficiency    Past Surgical History:  Procedure Laterality Date   BREAST BIOPSY  02/04/2022   MM LT RADIOACTIVE SEED EA ADD LESION LOC MAMMO GUIDE 02/04/2022 [facility]   BREAST BIOPSY  02/04/2022   MM LT RADIOACTIVE SEED LOC MAMMO GUIDE 02/04/2022 [facility]   BREAST LUMPECTOMY WITH RADIOACTIVE SEED LOCALIZATION Left 02/05/2022   Procedure: LEFT BREAST BRACKETED LUMPECTOMY WITH RADIOACTIVE SEED LOCALIZATION;  Surgeon: Griselda Miner, MD;  Location: [facility];   Service: General;  Laterality: Left;   CHOLECYSTECTOMY     COLONOSCOPY  09/10/2011   normal   JOINT REPLACEMENT  2022   left TKA   LAPAROSCOPIC REMOVAL ABDOMINAL MASS N/A 08/05/2022   Procedure: LAPAROSCOPIC ASSISTED EXCISION OF MESENTERIC MASS;  Surgeon: Fritzi Mandes, MD;  Location: [facility] OR;  Service: General;  Laterality: N/A;   LAPAROSCOPIC SMALL BOWEL RESECTION N/A 08/05/2022   Procedure: LAPAROSCOPIC SMALL BOWEL RESECTION;  Surgeon: Fritzi Mandes, MD;  Location: [facility];  Service: General;  Laterality: N/A;   LEFT HEART CATH AND CORONARY ANGIOGRAPHY N/A 08/18/2021   Procedure: LEFT HEART CATH AND CORONARY ANGIOGRAPHY;  Surgeon: Elder Negus, MD;  Location: [facility];  Service: Cardiovascular;  Laterality: N/A;   TOTAL KNEE ARTHROPLASTY Left 10/27/2020   Procedure: LEFT TOTAL KNEE ARTHROPLASTY;  Surgeon: Tarry Kos, MD;  Location: [facility];  Service: Orthopedics;  Laterality: Left;   TUBAL LIGATION     UPPER GASTROINTESTINAL ENDOSCOPY     Family History  Problem Relation Age of Onset   Diabetes Mother    Thyroid disease Mother    Kidney disease Mother    Hypertension Father    Hyperlipidemia Father    Sudden death Father    Stroke Father    Diabetes Sister    Heart disease Sister    Kidney disease Sister    COPD Sister    Heart disease Brother 52   Diabetes Brother    Stomach cancer Maternal Aunt        X 2 aunts   Diabetes Maternal Grandmother    Colon cancer Neg Hx    Esophageal cancer Neg Hx    Rectal cancer Neg Hx    Colon polyps Neg Hx    Social History   Socioeconomic History   Marital status: Married    Spouse name: Terrica Duecker   Number of children: 2   Years of education: Not on file   Highest education level: Not on file  Occupational History   Occupation: Housekeeping    Comment: Magazine features editor Service  Tobacco Use   Smoking status: Former    Current packs/day: 0.00    Average packs/day: 0.3 packs/day for 1 year (0.3 ttl pk-yrs)     Types: Cigarettes    Start date: 09/09/1973    Quit date: 09/10/1974    Years since quitting: 48.3   Smokeless tobacco: Never   Tobacco comments:    she no longer smokes.   Vaping Use   Vaping status: Never Used  Substance and Sexual Activity   Alcohol use: Never   Drug use: Never   Sexual activity: Not Currently    Birth control/protection: Post-menopausal  Other Topics Concern   Not on file  Social History Narrative   Lives with husband.  Part time job.    Social Determinants of Health   Financial Resource Strain: Low Risk  (12/23/2022)   Overall Financial Resource Strain (CARDIA)    Difficulty of Paying Living Expenses: Not hard at all  Food Insecurity: No Food Insecurity (12/23/2022)   Hunger Vital Sign    Worried About Running Out of Food in the Last Year: Never true    Ran Out of Food in the Last Year: Never true  Transportation Needs: No Transportation Needs (12/23/2022)   PRAPARE - Administrator, Civil Service (Medical): No    Lack of Transportation (Non-Medical): No  Physical Activity: Sufficiently Active (12/23/2022)   Exercise Vital Sign    Days of Exercise per Week: 7 days    Minutes of Exercise per Session: 30 min  Stress: No Stress Concern Present (12/23/2022)   [employer] of Occupational Health - Occupational Stress Questionnaire    Feeling of Stress : Not at all  Social Connections: Moderately Integrated (12/23/2022)   Social Connection and Isolation Panel [NHANES]    Frequency of Communication with Friends and Family: More than three times a week    Frequency of Social Gatherings with Friends and Family: More than three times a week    Attends Religious Services: More than 4 times per year    Active Member of [employer] or Organizations: No    Attends Banker Meetings: Never  Marital Status: Married    Tobacco Counseling Counseling given: Not Answered Tobacco comments: she no longer smokes.    Clinical  Intake:  Pre-visit preparation completed: Yes  Pain : 0-10 Pain Score: 9  Pain Type: Chronic pain Pain Location: Knee Pain Orientation: Left Pain Descriptors / Indicators: Aching Pain Onset: More than a month ago Pain Frequency: Constant     Nutritional Risks: None Diabetes: Yes CBG done?: No Did pt. bring in CBG monitor from home?: No  How often do you need to have someone help you when you read instructions, pamphlets, or other written materials from your doctor or pharmacy?: 1 - Never  Interpreter Needed?: No  Information entered by :: NAllen LPN   Activities of Daily Living    12/23/2022   12:13 PM 07/28/2022    9:40 AM  In your present state of health, do you have any difficulty performing the following activities:  Hearing? 0   Vision? 0   Difficulty concentrating or making decisions? 0   Walking or climbing stairs? 0   Comment due to knee   Dressing or bathing? 0   Doing errands, shopping? 0 0  Preparing Food and eating ? N   Using the Toilet? N   In the past six months, have you accidently leaked urine? N   Do you have problems with loss of bowel control? N   Managing your Medications? N   Managing your Finances? N   Housekeeping or managing your Housekeeping? N     Patient Care Team: Dorothyann Peng, MD as PCP - General (Internal Medicine) Rollene Rotunda, MD as PCP - Cardiology (Cardiology) Harlan Stains, [facility] (Inactive) (Pharmacist)  Indicate any recent Medical Services you may have received from other than [facility] providers in the past year (date may be approximate).     Assessment:   This is a routine wellness examination for Tamia.  Hearing/Vision screen Hearing Screening - Comments:: Denies hearing issues Vision Screening - Comments:: Regular eye exams, [facility]   Goals Addressed             This Visit's Progress    Patient Stated       12/23/2022, get off some medications       Depression Screen    12/23/2022   12:21 PM  10/07/2022   10:13 AM 08/24/2022    9:21 AM 07/05/2022    8:55 AM 12/19/2021    1:39 PM 12/19/2021    1:37 PM 09/16/2021    2:57 PM  PHQ 2/9 Scores  PHQ - 2 Score 0 0 0 0 0 0 0  PHQ- 9 Score 0 0 0 0       Fall Risk    12/23/2022   12:20 PM 10/07/2022   10:13 AM 08/24/2022    9:20 AM 07/05/2022    8:55 AM 04/20/2022    3:00 PM  Fall Risk   Falls in the past year? 0 0 0 1 0  Number falls in past yr: 0 0 0 0 0  Injury with Fall? 0 0 0 1 0  Risk for fall due to : Medication side effect No Fall Risks No Fall Risks History of fall(s) No Fall Risks  Follow up Falls prevention discussed;Falls evaluation completed Falls evaluation completed Falls evaluation completed Falls evaluation completed Falls evaluation completed    MEDICARE RISK AT HOME: Medicare Risk at Home Any stairs in or around the home?: Yes If so, are there any without handrails?: No Home free  of loose throw rugs in walkways, pet beds, electrical cords, etc?: Yes Adequate lighting in your home to reduce risk of falls?: Yes Life alert?: No Use of a cane, walker or w/c?: No Grab bars in the bathroom?: No Shower chair or bench in shower?: No Elevated toilet seat or a handicapped toilet?: No  TIMED UP AND GO:  Was the test performed?  No    Cognitive Function:        12/23/2022   12:21 PM 12/19/2021    1:40 PM 12/17/2020    9:11 AM 11/15/2019    9:36 AM 02/08/2019    2:30 PM  6CIT Screen  What Year? 0 points 0 points 0 points 0 points 0 points  What month? 0 points 0 points 0 points 0 points 0 points  What time? 0 points 0 points 0 points 0 points 0 points  Count back from 20 0 points  0 points 0 points 0 points  Months in reverse 2 points  0 points 0 points 0 points  Repeat phrase 2 points 6 points 2 points 2 points 0 points  Total Score 4 points  2 points 2 points 0 points    Immunizations Immunization History  Administered Date(s) Administered   DTaP 03/26/2013   Fluad Quad(high Dose 65+) 11/08/2018, 02/21/2020,  02/26/2021   Influenza, High Dose Seasonal PF 11/08/2018   Influenza-Unspecified 11/30/2017   Moderna Sars-Covid-2 Vaccination 08/03/2019, 08/31/2019   Td 08/20/2005    TDAP status: Up to date  Flu Vaccine status: Due, Education has been provided regarding the importance of this vaccine. Advised may receive this vaccine at local pharmacy or Health Dept. Aware to provide a copy of the vaccination record if obtained from local pharmacy or Health Dept. Verbalized acceptance and understanding.  Pneumococcal vaccine status: Declined,  Education has been provided regarding the importance of this vaccine but patient still declined. Advised may receive this vaccine at local pharmacy or Health Dept. Aware to provide a copy of the vaccination record if obtained from local pharmacy or Health Dept. Verbalized acceptance and understanding.   Covid-19 vaccine status: Information provided on how to obtain vaccines.   Qualifies for Shingles Vaccine? Yes   Zostavax completed No   Shingrix Completed?: No.    Education has been provided regarding the importance of this vaccine. Patient has been advised to call insurance company to determine out of pocket expense if they have not yet received this vaccine. Advised may also receive vaccine at local pharmacy or Health Dept. Verbalized acceptance and understanding.  Screening Tests Health Maintenance  Topic Date Due   COVID-19 Vaccine (3 - Moderna risk series) 09/28/2019   INFLUENZA VACCINE  10/21/2022   Diabetic kidney evaluation - Urine ACR  12/03/2022   FOOT EXAM  12/03/2022   Zoster Vaccines- Shingrix (1 of 2) 01/07/2023 (Originally 10/25/1974)   Pneumonia Vaccine 5+ Years old (1 of 1 - PCV) 07/05/2023 (Originally 10/26/2018)   OPHTHALMOLOGY EXAM  03/10/2023   DTaP/Tdap/Td (3 - Tdap) 03/27/2023   HEMOGLOBIN A1C  04/09/2023   Diabetic kidney evaluation - eGFR measurement  10/07/2023   MAMMOGRAM  11/04/2023   Medicare Annual Wellness (AWV)  12/23/2023    Colonoscopy  11/24/2024   DEXA SCAN  Completed   Hepatitis C Screening  Completed   HPV VACCINES  Aged Out    Health Maintenance  Health Maintenance Due  Topic Date Due   COVID-19 Vaccine (3 - Moderna risk series) 09/28/2019   INFLUENZA VACCINE  10/21/2022  Diabetic kidney evaluation - Urine ACR  12/03/2022   FOOT EXAM  12/03/2022    Colorectal cancer screening: Type of screening: Colonoscopy. Completed 11/24/2021. Repeat every 5 years  Mammogram status: Completed 11/03/2021. Repeat every year  Bone Density status: Completed 07/01/2022.   Lung Cancer Screening: (Low Dose CT Chest recommended if Age 75-80 years, 20 pack-year currently smoking OR have quit w/in 15years.) does not qualify.   Lung Cancer Screening Referral: no  Additional Screening:  Hepatitis C Screening: does qualify; Completed 07/04/2020  Vision Screening: Recommended annual ophthalmology exams for early detection of glaucoma and other disorders of the eye. Is the patient up to date with their annual eye exam?  Yes  Who is the provider or what is the name of the office in which the patient attends annual eye exams? [facility] If pt is not established with a provider, would they like to be referred to a provider to establish care? No .   Dental Screening: Recommended annual dental exams for proper oral hygiene  Diabetic Foot Exam: Diabetic Foot Exam: Overdue, Pt has been advised about the importance in completing this exam. Pt is scheduled for diabetic foot exam on next appointment.  Community Resource Referral / Chronic Care Management: CRR required this visit?  No   CCM required this visit?  No     Plan:     I have personally reviewed and noted the following in the patient's chart:   Medical and social history Use of alcohol, tobacco or illicit drugs  Current medications and supplements including opioid prescriptions. Patient is not currently taking opioid prescriptions. Functional ability and  status Nutritional status Physical activity Advanced directives List of other physicians Hospitalizations, surgeries, and ER visits in previous 12 months Vitals Screenings to include cognitive, depression, and falls Referrals and appointments  In addition, I have reviewed and discussed with patient certain preventive protocols, quality metrics, and best practice recommendations. A written personalized care plan for preventive services as well as general preventive health recommendations were provided to patient.     Barb Merino, LPN   16/03/958   After Visit Summary: (Pick Up) Due to this being a telephonic visit, with patients personalized plan was offered to patient and patient has requested to Pick up at office.  Nurse Notes: none

## 2022-12-23 NOTE — Patient Instructions (Signed)
Leslie Davis , Thank you for taking time to come for your Medicare Wellness Visit. I appreciate your ongoing commitment to your health goals. Please review the following plan we discussed and let me know if I can assist you in the future.   Referrals/Orders/Follow-Ups/Clinician Recommendations: none  This is a list of the screening recommended for you and due dates:  Health Maintenance  Topic Date Due   COVID-19 Vaccine (3 - Moderna risk series) 09/28/2019   Flu Shot  10/21/2022   Yearly kidney health urinalysis for diabetes  12/03/2022   Complete foot exam   12/03/2022   Zoster (Shingles) Vaccine (1 of 2) 01/07/2023*   Pneumonia Vaccine (1 of 1 - PCV) 07/05/2023*   Eye exam for diabetics  03/10/2023   DTaP/Tdap/Td vaccine (3 - Tdap) 03/27/2023   Hemoglobin A1C  04/09/2023   Yearly kidney function blood test for diabetes  10/07/2023   Mammogram  11/04/2023   Medicare Annual Wellness Visit  12/23/2023   Colon Cancer Screening  11/24/2024   DEXA scan (bone density measurement)  Completed   Hepatitis C Screening  Completed   HPV Vaccine  Aged Out  *Topic was postponed. The date shown is not the original due date.    Advanced directives: (ACP Link)Information on Advanced Care Planning can be found at [facility] [place] Advance Health Care Directives Advance Health Care Directives ([email or web address]   Next Medicare Annual Wellness Visit scheduled for next year: No, office will schedule appointment  Insert Preventive Care attachment Insert FALL PREVENTION attachment if needed

## 2022-12-28 ENCOUNTER — Other Ambulatory Visit: Payer: Self-pay | Admitting: Internal Medicine

## 2022-12-30 ENCOUNTER — Telehealth: Payer: Self-pay | Admitting: Orthopaedic Surgery

## 2022-12-30 NOTE — Telephone Encounter (Signed)
Patient called. Would like Dr. Roda Shutters to call her. Would like to talk about the surgery she needs. Her cb# [phone]

## 2023-01-03 NOTE — Telephone Encounter (Signed)
Spoke to patient

## 2023-01-03 NOTE — Patient Instructions (Signed)
Hypertension, Adult Hypertension is another name for high blood pressure. High blood pressure forces your heart to work harder to pump blood. This can cause problems over time. There are two numbers in a blood pressure reading. There is a top number (systolic) over a bottom number (diastolic). It is best to have a blood pressure that is below 120/80. What are the causes? The cause of this condition is not known. Some other conditions can lead to high blood pressure. What increases the risk? Some lifestyle factors can make you more likely to develop high blood pressure: Smoking. Not getting enough exercise or physical activity. Being overweight. Having too much fat, sugar, calories, or salt (sodium) in your diet. Drinking too much alcohol. Other risk factors include: Having any of these conditions: Heart disease. Diabetes. High cholesterol. Kidney disease. Obstructive sleep apnea. Having a family history of high blood pressure and high cholesterol. Age. The risk increases with age. Stress. What are the signs or symptoms? High blood pressure may not cause symptoms. Very high blood pressure (hypertensive crisis) may cause: Headache. Fast or uneven heartbeats (palpitations). Shortness of breath. Nosebleed. Vomiting or feeling like you may vomit (nauseous). Changes in how you see. Very bad chest pain. Feeling dizzy. Seizures. How is this treated? This condition is treated by making healthy lifestyle changes, such as: Eating healthy foods. Exercising more. Drinking less alcohol. Your doctor may prescribe medicine if lifestyle changes do not help enough and if: Your top number is above 130. Your bottom number is above 80. Your personal target blood pressure may vary. Follow these instructions at home: Eating and drinking  If told, follow the DASH eating plan. To follow this plan: Fill one half of your plate at each meal with fruits and vegetables. Fill one fourth of your plate  at each meal with whole grains. Whole grains include whole-wheat pasta, brown rice, and whole-grain bread. Eat or drink low-fat dairy products, such as skim milk or low-fat yogurt. Fill one fourth of your plate at each meal with low-fat (lean) proteins. Low-fat proteins include fish, chicken without skin, eggs, beans, and tofu. Avoid fatty meat, cured and processed meat, or chicken with skin. Avoid pre-made or processed food. Limit the amount of salt in your diet to less than 1,500 mg each day. Do not drink alcohol if: Your doctor tells you not to drink. You are pregnant, may be pregnant, or are planning to become pregnant. If you drink alcohol: Limit how much you have to: 0-1 drink a day for women. 0-2 drinks a day for men. Know how much alcohol is in your drink. In the [place] one drink equals one 12 oz bottle of beer (355 mL), one 5 oz glass of wine (148 mL), or one 1 oz glass of hard liquor (44 mL). Lifestyle  Work with your doctor to stay at a healthy weight or to lose weight. Ask your doctor what the best weight is for you. Get at least 30 minutes of exercise that causes your heart to beat faster (aerobic exercise) most days of the week. This may include walking, swimming, or biking. Get at least 30 minutes of exercise that strengthens your muscles (resistance exercise) at least 3 days a week. This may include lifting weights or doing Pilates. Do not smoke or use any products that contain nicotine or tobacco. If you need help quitting, ask your doctor. Check your blood pressure at home as told by your doctor. Keep all follow-up visits. Medicines Take over-the-counter and prescription medicines   only as told by your doctor. Follow directions carefully. Do not skip doses of blood pressure medicine. The medicine does not work as well if you skip doses. Skipping doses also puts you at risk for problems. Ask your doctor about side effects or reactions to medicines that you should watch  for. Contact a doctor if: You think you are having a reaction to the medicine you are taking. You have headaches that keep coming back. You feel dizzy. You have swelling in your ankles. You have trouble with your vision. Get help right away if: You get a very bad headache. You start to feel mixed up (confused). You feel weak or numb. You feel faint. You have very bad pain in your: Chest. Belly (abdomen). You vomit more than once. You have trouble breathing. These symptoms may be an emergency. Get help right away. Call 911. Do not wait to see if the symptoms will go away. Do not drive yourself to the hospital. Summary Hypertension is another name for high blood pressure. High blood pressure forces your heart to work harder to pump blood. For most people, a normal blood pressure is less than 120/80. Making healthy choices can help lower blood pressure. If your blood pressure does not get lower with healthy choices, you may need to take medicine. This information is not intended to replace advice given to you by your health care provider. Make sure you discuss any questions you have with your health care provider. Document Revised: 12/25/2020 Document Reviewed: 12/25/2020 Elsevier Patient Education  2024 [employer]  

## 2023-01-03 NOTE — Progress Notes (Signed)
I,Victoria T Deloria Lair, CMA,acting as a Neurosurgeon for Gwynneth Aliment, MD.,have documented all relevant documentation on the behalf of Gwynneth Aliment, MD,as directed by  Gwynneth Aliment, MD while in the presence of Gwynneth Aliment, MD.  Subjective:  Patient ID: Leslie Davis , female    DOB: 29-Mar-1955 , 67 y.o.   MRN: [id number]  Chief Complaint  Patient presents with   Hypertension   Diabetes    HPI  Patient is here today for a diabetes/HTN follow up. She denies having any headaches, chest pain and shortness of breath. Patient reports compliance with her meds.  She would like to discuss a medication to help suppress her appetite. She states she is  no longer taking Ozempic which did help.   Diabetes She presents for her follow-up diabetic visit. She has type 2 diabetes mellitus. Her disease course has been stable. Pertinent negatives for diabetes include no blurred vision, no polydipsia, no polyphagia and no polyuria. There are no hypoglycemic complications. Diabetic complications include nephropathy. Risk factors for coronary artery disease include diabetes mellitus, dyslipidemia, hypertension and post-menopausal. She participates in exercise intermittently. An ACE inhibitor/angiotensin II receptor blocker is being taken. Eye exam is current.  Hypertension This is a chronic problem. The current episode started more than 1 year ago. The problem has been gradually improving since onset. Pertinent negatives include no blurred vision. Risk factors for coronary artery disease include dyslipidemia and diabetes mellitus. The current treatment provides moderate improvement.     Past Medical History:  Diagnosis Date   CKD (chronic kidney disease)    stage 3   Coronary artery disease 07/2021   obstructive CAD-70% LAD-treated medically   DM (diabetes mellitus) (HCC)    Fatty liver disease, nonalcoholic    Gastritis    Gastroparesis    GERD (gastroesophageal reflux disease)    Heartburn     Hiatal hernia    HLD (hyperlipidemia)    HTN (hypertension)    Joint pain    Renal calculus    Rheumatoid arthritis (HCC)    Vitamin D deficiency      Family History  Problem Relation Age of Onset   Diabetes Mother    Thyroid disease Mother    Kidney disease Mother    Hypertension Father    Hyperlipidemia Father    Sudden death Father    Stroke Father    Diabetes Sister    Heart disease Sister    Kidney disease Sister    COPD Sister    Heart disease Brother 84   Diabetes Brother    Stomach cancer Maternal Aunt        X 2 aunts   Diabetes Maternal Grandmother    Colon cancer Neg Hx    Esophageal cancer Neg Hx    Rectal cancer Neg Hx    Colon polyps Neg Hx      Current Outpatient Medications:    acetaminophen (TYLENOL) 500 MG tablet, Take 1 tablet (500 mg total) by mouth every 6 (six) hours as needed for mild pain., Disp: 30 tablet, Rfl: 0   ALLERGY RELIEF CETIRIZINE 10 MG tablet, TAKE 1 TABLET BY MOUTH ONCE DAILY, Disp: 30 tablet, Rfl: 10   amLODipine (NORVASC) 10 MG tablet, TAKE ONE TABLET BY MOUTH ONCE DAILY, Disp: 90 tablet, Rfl: 3   aspirin EC (ASPIRIN LOW DOSE) 81 MG tablet, TAKE 1 TABLET BY MOUTH ONCE DAILY *SWALLOW WHOLE* *REFILL REQUEST*, Disp: 90 tablet, Rfl: 1   Blood Glucose Monitoring Suppl (  ONETOUCH VERIO) w/Device KIT, Use as directed to check blood sugars 2 times per day dx: e11.65, Disp: 1 kit, Rfl: 1   carvedilol (COREG) 3.125 MG tablet, TAKE ONE TABLET BY MOUTH TWICE DAILY WITH A MEAL, Disp: 180 tablet, Rfl: 1   celecoxib (CELEBREX) 200 MG capsule, Take 1 capsule (200 mg total) by mouth 2 (two) times daily as needed., Disp: 180 capsule, Rfl: 2   clindamycin (CLEOCIN T) 1 % lotion, Apply 1 Application topically daily as needed (acne)., Disp: , Rfl:    EDARBI 80 MG TABS, TAKE ONE TABLET BY MOUTH ONCE DAILY, Disp: 90 tablet, Rfl: 2   ezetimibe (ZETIA) 10 MG tablet, Take 1 tablet (10 mg total) by mouth daily., Disp: 90 tablet, Rfl: 3   FARXIGA 10 MG TABS  tablet, TAKE ONE TABLET BY MOUTH BEFORE BREAKFAST daily, Disp: 90 tablet, Rfl: 2   insulin lispro (HUMALOG) 100 UNIT/ML KwikPen, Inject 0-6 Units into the skin 3 (three) times daily with meals. Check Blood Glucose (BG) and inject per scale: BG <150= 0 unit; BG 150-200= 1 unit; BG 201-250= 2 unit; BG 251-300= 3 unit; BG 301-350= 4 unit; BG 351-400= 5 unit; BG >400= 6 unit and Call Primary Care., Disp: 15 mL, Rfl: 0   Insulin Pen Needle (PEN NEEDLES) 31G X 5 MM MISC, 1 each by Does not apply route 3 (three) times daily. May dispense any manufacturer covered by patient's insurance., Disp: 100 each, Rfl: 0   linaclotide ([name]) 145 MCG CAPS capsule, Take 1 capsule (145 mcg total) by mouth daily., Disp: 30 capsule, Rfl: 0   ondansetron (ZOFRAN) 4 MG tablet, TAKE 1 TABLET BY MOUTH EVERY 8 HOURS AS NEEDED FOR NAUSEA & VOMITING, Disp: 20 tablet, Rfl: 1   OneTouch Delica Lancets 33G MISC, Use as directed to check blood sugars 2 times per day dx:e11.65, Disp: 150 each, Rfl: 3   pantoprazole (PROTONIX) 40 MG tablet, Take 1 tablet (40 mg total) by mouth 2 (two) times daily., Disp: 60 tablet, Rfl: 3   polyethylene glycol (MIRALAX / GLYCOLAX) 17 g packet, Take 17 g by mouth daily as needed for mild constipation., Disp: 14 each, Rfl: 0   rosuvastatin (CRESTOR) 20 MG tablet, TAKE ONE TABLET BY MOUTH ONCE DAILY, Disp: 90 tablet, Rfl: 2   temazepam (RESTORIL) 30 MG capsule, TAKE 1 CAPSULE BY MOUTH AT BEDTIME AS NEEDED *REFILL REQUEST*, Disp: 30 capsule, Rfl: 0   traMADol (ULTRAM) 50 MG tablet, Take 1-2 tablets (50-100 mg total) by mouth daily as needed., Disp: 20 tablet, Rfl: 0   TRESIBA FLEXTOUCH 100 UNIT/ML FlexTouch Pen, INJECT 10 UNITS SUBCUTANEOUSLY AT BEDTIME, Disp: , Rfl:    TRUEPLUS PEN NEEDLES 31G X 6 MM MISC, USE WITH pen TO INJECT insulin DAILY, Disp: 150 each, Rfl: 3   ferrous gluconate (FERGON) 324 MG tablet, TAKE 1 TABLET BY MOUTH TWICE DAILY WITH A MEAL., Disp: 100 tablet, Rfl: 0   glucose blood  (ONETOUCH VERIO) test strip, USE TO TEST BLOOD SUGAR TWICE DAILY AS DIRECTED, Disp: 100 each, Rfl: 10   nitroGLYCERIN (NITROSTAT) 0.4 MG SL tablet, Place 1 tablet (0.4 mg total) under the tongue every 5 (five) minutes as needed for chest pain. If you require more than two tablets five minutes apart go to the nearest ER via EMS., Disp: 30 tablet, Rfl: 0   Allergies  Allergen Reactions   Fiasp [Insulin Aspart (W-Niacinamide)] Hives     Review of Systems  Constitutional: Negative.   Eyes:  Negative  for blurred vision.  Respiratory: Negative.    Cardiovascular: Negative.   Gastrointestinal: Negative.   Endocrine: Negative for polydipsia, polyphagia and polyuria.  Skin: Negative.   Neurological: Negative.   Psychiatric/Behavioral: Negative.       Today's Vitals   01/04/23 0941 01/04/23 1007  BP: (!) 150/90 (!) 140/78  Pulse: (!) 52   Temp: 98.1 F (36.7 C)   SpO2: 98%   Weight: 201 lb (91.2 kg)   Height: 5\' 2"  (1.575 m)    Body mass index is 36.76 kg/m.  Wt Readings from Last 3 Encounters:  01/05/23 197 lb (89.4 kg)  01/04/23 201 lb (91.2 kg)  10/25/22 191 lb (86.6 kg)    BP Readings from Last 3 Encounters:  01/05/23 135/82  01/04/23 (!) 140/78  10/25/22 (!) 143/71     Objective:  Physical Exam Vitals and nursing note reviewed.  Constitutional:      Appearance: Normal appearance. She is obese.  HENT:     Head: Normocephalic and atraumatic.  Eyes:     Extraocular Movements: Extraocular movements intact.  Cardiovascular:     Rate and Rhythm: Normal rate and regular rhythm.     Heart sounds: Normal heart sounds.  Pulmonary:     Effort: Pulmonary effort is normal.     Breath sounds: Normal breath sounds.  Musculoskeletal:     Cervical back: Normal range of motion.  Skin:    General: Skin is warm.  Neurological:     General: No focal deficit present.     Mental Status: She is alert.  Psychiatric:        Mood and Affect: Mood normal.        Behavior: Behavior  normal.         Assessment And Plan:  Hypertensive heart and renal disease with renal failure, stage 1 through stage 4 or unspecified chronic kidney disease, without heart failure Assessment & Plan: Chronic, uncontrolled. She will c/w amlodipine 10mg , carvedilol 3.125mg  twice daily and Edarbi 80mg  daily.  She has gained about ten pounds over the past several months, which is likely impacting her blood pressure. Encouraged to follow low sodium diet, make necessary dietary changes and increase daily activity.   Orders: -     CMP14+EGFR -     Lipid panel  Stage 3a chronic kidney disease (HCC) Assessment & Plan: Chronic, she is encouraged to stay well hydrated, avoid NSAIDs and keep BP controlled to prevent progression of CKD.     Hyperlipidemia associated with type 2 diabetes mellitus (HCC) Assessment & Plan: Chronic, she is now followed by Endocrine. Dr. Willeen Cass input is greatly appreciated. She is no longer on Ozempic due to neuroendocrine tumor.  She is encouraged to increase her daily activity and to optimize her protein intake along with increasing her fiber intake - which will help to make her feel full.    Orders: -     CBC -     CMP14+EGFR -     Lipid panel -     Hemoglobin A1c -     Microalbumin / creatinine urine ratio  Atherosclerosis of aorta (HCC) Assessment & Plan: Chronic, LDL goal < 70.  She wil c/w rosuvastatin 20mg  and ezetimibe 10mg  daily. She is encouraged to follow a heart healthy lifestyle.   Orders: -     Lipid panel  Class 2 severe obesity due to excess calories with serious comorbidity and body mass index (BMI) of 36.0 to 36.9 in adult [facility]) Assessment & Plan:  She is encouraged to strive for BMi less than 30 to decrease cardiac risk. Advised to aim for at least 150 minutes of exercise per week. She is encouraged to return to working out at the gym - aiming for at least 150 minutes of exercise per week.    Immunization due -     Flu Vaccine Trivalent  High Dose (Fluad)  She is encouraged to strive for BMI less than 30 to decrease cardiac risk. Advised to aim for at least 150 minutes of exercise per week.    Return in 6 months (on 07/05/2023) for physical.  Patient was given opportunity to ask questions. Patient verbalized understanding of the plan and was able to repeat key elements of the plan. All questions were answered to their satisfaction.    I, Gwynneth Aliment, MD, have reviewed all documentation for this visit. The documentation on 01/04/23 for the exam, diagnosis, procedures, and orders are all accurate and complete.   IF YOU HAVE BEEN REFERRED TO A SPECIALIST, IT MAY TAKE 1-2 WEEKS TO SCHEDULE/PROCESS THE REFERRAL. IF YOU HAVE NOT HEARD FROM US/SPECIALIST IN TWO WEEKS, PLEASE GIVE [place] A CALL AT [phone].   THE PATIENT IS ENCOURAGED TO PRACTICE SOCIAL DISTANCING DUE TO THE COVID-19 PANDEMIC.

## 2023-01-04 ENCOUNTER — Ambulatory Visit: Payer: 59 | Admitting: Cardiology

## 2023-01-04 ENCOUNTER — Ambulatory Visit (INDEPENDENT_AMBULATORY_CARE_PROVIDER_SITE_OTHER): Payer: 59 | Admitting: Internal Medicine

## 2023-01-04 ENCOUNTER — Encounter: Payer: Self-pay | Admitting: Internal Medicine

## 2023-01-04 VITALS — BP 140/78 | HR 52 | Temp 98.1°F | Ht 62.0 in | Wt 201.0 lb

## 2023-01-04 DIAGNOSIS — N1831 Chronic kidney disease, stage 3a: Secondary | ICD-10-CM | POA: Diagnosis not present

## 2023-01-04 DIAGNOSIS — E1169 Type 2 diabetes mellitus with other specified complication: Secondary | ICD-10-CM

## 2023-01-04 DIAGNOSIS — I1310 Hypertensive heart and chronic kidney disease without heart failure, with stage 1 through stage 4 chronic kidney disease, or unspecified chronic kidney disease: Secondary | ICD-10-CM | POA: Diagnosis not present

## 2023-01-04 DIAGNOSIS — I700 Atherosclerosis of aorta: Secondary | ICD-10-CM

## 2023-01-04 DIAGNOSIS — Z6836 Body mass index (BMI) 36.0-36.9, adult: Secondary | ICD-10-CM

## 2023-01-04 DIAGNOSIS — Z794 Long term (current) use of insulin: Secondary | ICD-10-CM

## 2023-01-04 DIAGNOSIS — E785 Hyperlipidemia, unspecified: Secondary | ICD-10-CM

## 2023-01-04 DIAGNOSIS — E66812 Obesity, class 2: Secondary | ICD-10-CM

## 2023-01-04 DIAGNOSIS — Z23 Encounter for immunization: Secondary | ICD-10-CM

## 2023-01-05 ENCOUNTER — Ambulatory Visit (INDEPENDENT_AMBULATORY_CARE_PROVIDER_SITE_OTHER): Payer: 59 | Admitting: Family Medicine

## 2023-01-05 ENCOUNTER — Encounter (INDEPENDENT_AMBULATORY_CARE_PROVIDER_SITE_OTHER): Payer: Self-pay | Admitting: Family Medicine

## 2023-01-05 VITALS — BP 135/82 | HR 55 | Temp 97.9°F | Ht 62.0 in | Wt 197.0 lb

## 2023-01-05 DIAGNOSIS — Z6836 Body mass index (BMI) 36.0-36.9, adult: Secondary | ICD-10-CM

## 2023-01-05 DIAGNOSIS — C7A8 Other malignant neuroendocrine tumors: Secondary | ICD-10-CM | POA: Diagnosis not present

## 2023-01-05 DIAGNOSIS — E1159 Type 2 diabetes mellitus with other circulatory complications: Secondary | ICD-10-CM | POA: Diagnosis not present

## 2023-01-05 DIAGNOSIS — I152 Hypertension secondary to endocrine disorders: Secondary | ICD-10-CM | POA: Diagnosis not present

## 2023-01-05 DIAGNOSIS — Z7985 Long-term (current) use of injectable non-insulin antidiabetic drugs: Secondary | ICD-10-CM

## 2023-01-05 DIAGNOSIS — E1169 Type 2 diabetes mellitus with other specified complication: Secondary | ICD-10-CM | POA: Diagnosis not present

## 2023-01-05 DIAGNOSIS — Z794 Long term (current) use of insulin: Secondary | ICD-10-CM | POA: Diagnosis not present

## 2023-01-05 DIAGNOSIS — E669 Obesity, unspecified: Secondary | ICD-10-CM

## 2023-01-05 DIAGNOSIS — R0602 Shortness of breath: Secondary | ICD-10-CM | POA: Diagnosis not present

## 2023-01-05 LAB — CBC
Hematocrit: 41.4 % (ref 34.0–46.6)
Hemoglobin: 13.3 g/dL (ref 11.1–15.9)
MCH: 28.1 pg (ref 26.6–33.0)
MCHC: 32.1 g/dL (ref 31.5–35.7)
MCV: 87 fL (ref 79–97)
Platelets: 237 10*3/uL (ref 150–450)
RBC: 4.74 x10E6/uL (ref 3.77–5.28)
RDW: 13.6 % (ref 11.7–15.4)
WBC: 4.8 10*3/uL (ref 3.4–10.8)

## 2023-01-05 LAB — LIPID PANEL
Chol/HDL Ratio: 2 {ratio} (ref 0.0–4.4)
Cholesterol, Total: 210 mg/dL — ABNORMAL HIGH (ref 100–199)
HDL: 105 mg/dL (ref >39)
LDL Chol Calc (NIH): 89 mg/dL (ref 0–99)
Triglycerides: 94 mg/dL (ref 0–149)
VLDL Cholesterol Cal: 16 mg/dL (ref 5–40)

## 2023-01-05 LAB — CMP14+EGFR
ALT: 16 [IU]/L (ref 0–32)
AST: 14 [IU]/L (ref 0–40)
Albumin: 4.1 g/dL (ref 3.9–4.9)
Alkaline Phosphatase: 121 [IU]/L (ref 44–121)
BUN/Creatinine Ratio: 25 (ref 12–28)
BUN: 35 mg/dL — ABNORMAL HIGH (ref 8–27)
Bilirubin Total: 0.3 mg/dL (ref 0.0–1.2)
CO2: 22 mmol/L (ref 20–29)
Calcium: 9.9 mg/dL (ref 8.7–10.3)
Chloride: 104 mmol/L (ref 96–106)
Creatinine, Ser: 1.39 mg/dL — ABNORMAL HIGH (ref 0.57–1.00)
Globulin, Total: 3.1 g/dL (ref 1.5–4.5)
Glucose: 247 mg/dL — ABNORMAL HIGH (ref 70–99)
Potassium: 5.4 mmol/L — ABNORMAL HIGH (ref 3.5–5.2)
Sodium: 139 mmol/L (ref 134–144)
Total Protein: 7.2 g/dL (ref 6.0–8.5)
eGFR: 41 mL/min/{1.73_m2} — ABNORMAL LOW (ref >59)

## 2023-01-05 LAB — HEMOGLOBIN A1C
Est. average glucose Bld gHb Est-mCnc: 209 mg/dL
Hgb A1c MFr Bld: 8.9 % — ABNORMAL HIGH (ref 4.8–5.6)

## 2023-01-05 LAB — MICROALBUMIN / CREATININE URINE RATIO
Creatinine, Urine: 42.9 mg/dL
Microalb/Creat Ratio: 959 mg/g{creat} — ABNORMAL HIGH (ref 0–29)
Microalbumin, Urine: 411.5 ug/mL

## 2023-01-05 NOTE — Progress Notes (Signed)
Leslie Davis, D.O.  ABFM, ABOM Specializing in Clinical Bariatric Medicine  Office located at: [address] [place], [place]  [address]     Assessment and Plan:   Medications Discontinued During This Encounter  Medication Reason   docusate sodium (COLACE) 100 MG capsule    OZEMPIC, 2 MG/DOSE, 8 MG/3ML SOPN    Semaglutide,0.25 or 0.5MG /DOS, 2 MG/3ML SOPN      Type 2 diabetes mellitus with obesity (HCC) Assessment & Plan: Lab Results  Component Value Date   HGBA1C 8.9 (H) 01/04/2023   HGBA1C 9.1 (H) 10/07/2022   HGBA1C 6.8 (H) 07/05/2022   Pt taking 10 units of Tresiba daily and 3 units of Humalog seldemly, pt rarely using Humalog coverage with meals. She is also on Farxiga 10 mg daily (per cardiology). Has been off ozempic since her cancer diagnosis. Reviewed A1C uncontrolled at 8.9 as of 01/04/23. Goal A1C of below 7 was reviewed with pt. Of note, her A1C was lower when she was coming in for office visits more frequently. Hunger and cravings are uncontrolled. Pt reports the lowest her sugars run is 140s (fasting) and highest was 210 (after eating).   Encouraged her to prioritize finishing her meals, increasing protein intake, and increasing exercise. Advised to stay away from high carb foods, encouraged healthier alternatives such as cauliflower rice or brown rice. Avoid snacking if she is unable to finish meals and avoid microwave meals. Reviewed with pt how to take medications and use sliding scale. Will continue diabetes med management per PCP/specialist. We will monitor her condition as it pertains to her weight loss journey.    Hypertension associated with type 2 diabetes mellitus [facility]) Assessment & Plan: BP Readings from Last 3 Encounters:  01/05/23 135/82  01/04/23 (!) 140/78  10/25/22 (!) 143/71   BP is at goal today. Pt is taking Carvedilol 3.125 mg BID and Amlodipine 10 mg daily without side effects. Pt is asymptomatic and  Without side effects.  Tolerating well. No concerns.   Continue with all antihypertensives per PCP/specialists. Continue with prudent nutritional meal plan and engaging in regular exercise as discussed. We will continue to monitor symptoms as they relate to the her weight loss journey.    Neuroendocrine carcinoma of colon (HCC)- carcinoid tumor Assessment & Plan: Pt with recent history of neuroendocrine tumor in small intestines with resection.  Pt reports being cancer free as of 2 weeks ago, confirmed with CT scan. Per Dr. Zella Ball office visit note on 08/24/22, "On 08/05/22, she had a laparoscopic resection of a mesenteric mass and small bowel resection for an associated small bowel tumor. She had persistent hyperglycemia>300 in spite of being on her regular medication Farxiga. The hospitalist service was consulted for assistance with insulin management. She was started on Semglee and sliding scale insulin. By post-op day 4 she was ambulating, tolerating a regular diet, and had a bowel movement. Blood glucose control had improved. She was examined and deemed stable for discharge home. She was discharged on sliding scale Humalog per hospitalist recommendations."   Discussed with pt risks of MEN. We had extensive discussion about risks for having neuro endocrine tumor and use of GLP1s. I do not recommend pt resume her ozempic and I concur with Dr. Allyne Gee that this should be held.  Surgeon pathology results reviewed which showed tumor cells were positive for findings consistent with neuro endocrine tumor. Continue to follow up and adhere to treatment plan per general surgeon and/or specialists.   SOB (shortness  of breath) on exertion Assessment: Condition is Not at goal. Leslie Davis does feel that she gets out of breath more easily that she used to when she exercises. Leslie Davis's shortness of breath appears to be obesity related and exercise induced, as they do not appear to have any "red flag" symptoms/ concerns today.  Also, this  condition appears to be related to a state of poor cardiovascular conditioning. These symptoms are stable and likely due to the decondition she experienced while having surgery in the recent past.   Plan:   Indirect Calorimeter performed today in office and interpreted by myself- see details below.  Indirect Calorimeter showed a VO2 was 197 and a REE was 1354. Her calculated BMR was 1471. Her metabolism has decreased from her prior IC, which was 1562 in May of 2022. This is slightly worse than expected. I recommend she follow her plan by getting adequate protein daily which would aid in speeding metabolism and stabilizing sugars. This information is a critical component in devising her nutritional plan today.  Counseling was done with patient on these findings and on various ways to make these results more favorable for weight loss.   Patient agreed to work on weight loss at this time as her symptoms appear to be related to excess weight and deconditioning.  As her strength improves she will advance exercises as tolerated. Leslie Davis verbalizes agreement with this plan.    Generalized obesity: Starting BMI 35.6 BMI 36.0-36.9,adult current bmi 36.02 Assessment & Plan: Leslie Davis is here to discuss her progress with her obesity treatment plan along with follow-up of her obesity related diagnoses. See Medical Weight Management Flowsheet for complete bioelectrical impedance results.  Since last office visit on 10/25/22 patient's muscle mass has increased by 2.6 lb. Fat mass has increased by 2.6 lb. Total body water has decreased by 2.4 lb.  Counseling done on how various foods will affect these numbers and how to maximize success  Total lbs lost to date: 11 lbs Total weight loss percentage to date: -5.29%   No change to meal plan - see Subjective  Behavioral Intervention Additional resources provided today:  healthy eating choices and healthy proteins handouts Evidence-based interventions for  health behavior change were utilized today including the discussion of self monitoring techniques, problem-solving barriers and SMART goal setting techniques.   Regarding patient's less desirable eating habits and patterns, we employed the technique of small changes.  Pt will specifically work on: Increasing protein intake and following her meal plan for next visit.    FOLLOW UP: Return in about 3 weeks (around 01/26/2023). She was informed of the importance of frequent follow up visits to maximize her success with intensive lifestyle modifications for her multiple health conditions.  Subjective:   Chief complaint: Obesity Leslie Davis is here to discuss her progress with her obesity treatment plan. She is on the the Category 1 Plan with B/L options and states she is following her eating plan approximately 80% of the time. She states she is biking/walling 60-90 minutes 3 days per week.  Interval History:  Leslie Davis is here for a follow up office visit. Since last OV, her hunger and cravings have been uncontrolled. Her stress levels have been well managed recently. She reports having a CT scan a couple of weeks ago that revealed she is cancer free.    Pharmacotherapy for weight loss: She is not currently taking medications  for medical weight loss.  Denies side effects.    Review of Systems:  Pertinent positives were addressed with patient today.  Reviewed by clinician on day of visit: allergies, medications, problem list, medical history, surgical history, family history, social history, and previous encounter notes.  Weight Summary and Biometrics   No data recorded  No data recorded    No data recorded  No data recorded  No data recorded  No data recorded  Please see synopsis data 01/05/23 at 8 AM.   Objective:   PHYSICAL EXAM: Blood pressure 135/82, pulse (!) 55, temperature 97.9 F (36.6 C), height 5\' 2"  (1.575 m), weight 197 lb (89.4 kg), SpO2 98%. Body mass index  is 36.03 kg/m.  General: Well Developed, well nourished, and in no acute distress.  HEENT: Normocephalic, atraumatic Skin: Warm and dry, cap RF less 2 sec, good turgor Chest:  Normal excursion, shape, no gross abn Respiratory: speaking in full sentences, no conversational dyspnea NeuroM-Sk: Ambulates w/o assistance, moves * 4 Psych: A and O *3, insight good, mood-full  DIAGNOSTIC DATA REVIEWED:  BMET    Component Value Date/Time   NA 139 01/04/2023 1031   K 5.4 (H) 01/04/2023 1031   CL 104 01/04/2023 1031   CO2 22 01/04/2023 1031   GLUCOSE 247 (H) 01/04/2023 1031   GLUCOSE 201 (H) 08/06/2022 0809   BUN 35 (H) 01/04/2023 1031   CREATININE 1.39 (H) 01/04/2023 1031   CREATININE 0.74 08/30/2011 1307   CALCIUM 9.9 01/04/2023 1031   GFRNONAA 57 (L) 08/06/2022 0809   GFRAA 57 (L) 02/21/2020 1500   Lab Results  Component Value Date   HGBA1C 8.9 (H) 01/04/2023   HGBA1C (H) 09/12/2007    9.4 (NOTE)   The ADA recommends the following therapeutic goal for glycemic   control related to Hgb A1C measurement:   Goal of Therapy:   < 7.0% Hgb A1C   Reference: American Diabetes Association: Clinical Practice   Recommendations 2008, Diabetes Care,  2008, 31:(Suppl 1).   No results found for: "INSULIN" Lab Results  Component Value Date   TSH 1.640 06/17/2021   CBC    Component Value Date/Time   WBC 4.8 01/04/2023 1031   WBC 6.2 08/06/2022 0809   RBC 4.74 01/04/2023 1031   RBC 3.79 (L) 08/06/2022 0809   HGB 13.3 01/04/2023 1031   HCT 41.4 01/04/2023 1031   PLT 237 01/04/2023 1031   MCV 87 01/04/2023 1031   MCH 28.1 01/04/2023 1031   MCH 28.2 08/06/2022 0809   MCHC 32.1 01/04/2023 1031   MCHC 32.0 08/06/2022 0809   RDW 13.6 01/04/2023 1031   Iron Studies    Component Value Date/Time   IRON 68 11/16/2017 0747   TIBC 355 11/16/2017 0747   FERRITIN 45.2 11/16/2017 0747   IRONPCTSAT 19 11/16/2017 0747   Lipid Panel     Component Value Date/Time   CHOL 210 (H) 01/04/2023  1031   TRIG 94 01/04/2023 1031   HDL 105 01/04/2023 1031   CHOLHDL 2.0 01/04/2023 1031   CHOLHDL 2.8 09/13/2007 0405   VLDL 17 09/13/2007 0405   LDLCALC 89 01/04/2023 1031   Hepatic Function Panel     Component Value Date/Time   PROT 7.2 01/04/2023 1031   ALBUMIN 4.1 01/04/2023 1031   AST 14 01/04/2023 1031   ALT 16 01/04/2023 1031   ALKPHOS 121 01/04/2023 1031   BILITOT 0.3 01/04/2023 1031   BILIDIR 0.1 09/06/2019 2304   BILIDIR 0.09 11/08/2018 1017   IBILI 0.4 09/06/2019 2304      Component Value Date/Time  TSH 1.640 06/17/2021 1606   Nutritional Lab Results  Component Value Date   VD25OH 32.5 07/05/2022   VD25OH 48.3 09/14/2021   VD25OH 82.1 03/31/2021    Attestations:   Reviewed by clinician on day of visit: allergies, medications, problem list, medical history, surgical history, family history, social history, and previous encounter notes pertinent to patient's obesity diagnosis.    This encounter took 40 total minutes of time including time coordinating the above treatment plan, any pre-visit chart review and post-visit documentation and reviewing any labs and/or imaging, reviewing pertinent prior notes, and providing counseling the patient about such treatment plan.  Approximately 50% of this time was spent in direct, face-to-face counseling and coordination of care.   I, Leslie Davis, acting as a medical scribe for Leslie Lot, DO., have compiled all relevant documentation for today's office visit on behalf of Leslie Lot, DO, while in the presence of [employer], DO.  I have reviewed the above documentation for accuracy and completeness, and I agree with the above. Leslie Davis, D.O.  The 21st Century Cures Act was signed into law in 2016 which includes the topic of electronic health records.  This provides immediate access to information in MyChart.  This includes consultation notes, operative notes, office notes, lab results and pathology  reports.  If you have any questions about what you read please let us know at your next visit so we can discuss your concerns and take corrective action if need be.  We are right here with you.

## 2023-01-16 NOTE — Assessment & Plan Note (Signed)
Chronic, LDL goal < 70.  She wil c/w rosuvastatin 20mg  and ezetimibe 10mg  daily. She is encouraged to follow a heart healthy lifestyle.

## 2023-01-16 NOTE — Assessment & Plan Note (Signed)
She is encouraged to strive for BMi less than 30 to decrease cardiac risk. Advised to aim for at least 150 minutes of exercise per week. She is encouraged to return to working out at the gym - aiming for at least 150 minutes of exercise per week.

## 2023-01-16 NOTE — Assessment & Plan Note (Signed)
Chronic, she is now followed by Endocrine. Dr. Willeen Cass input is greatly appreciated. She is no longer on Ozempic due to neuroendocrine tumor.  She is encouraged to increase her daily activity and to optimize her protein intake along with increasing her fiber intake - which will help to make her feel full.

## 2023-01-17 NOTE — Assessment & Plan Note (Signed)
Chronic, uncontrolled. She will c/w amlodipine 10mg , carvedilol 3.125mg  twice daily and Edarbi 80mg  daily.  She has gained about ten pounds over the past several months, which is likely impacting her blood pressure. Encouraged to follow low sodium diet, make necessary dietary changes and increase daily activity.

## 2023-01-19 DIAGNOSIS — N1831 Chronic kidney disease, stage 3a: Secondary | ICD-10-CM | POA: Insufficient documentation

## 2023-01-19 NOTE — Assessment & Plan Note (Signed)
 Chronic, she is encouraged to stay well hydrated, avoid NSAIDs and keep BP controlled to prevent progression of CKD.

## 2023-01-20 ENCOUNTER — Other Ambulatory Visit: Payer: Self-pay | Admitting: Internal Medicine

## 2023-01-20 DIAGNOSIS — K2970 Gastritis, unspecified, without bleeding: Secondary | ICD-10-CM

## 2023-01-24 ENCOUNTER — Other Ambulatory Visit: Payer: Self-pay | Admitting: Pharmacist

## 2023-01-24 DIAGNOSIS — E1122 Type 2 diabetes mellitus with diabetic chronic kidney disease: Secondary | ICD-10-CM

## 2023-01-24 NOTE — Progress Notes (Signed)
Pharmacy Quality Measure Review  This patient is appearing on a report for being at risk of failing the Glycemic Status Assessment in Diabetes measure this calendar year. CGM may be appropriate for patient given use of insulin. Will collaborate with PCP.   Catie Eppie Gibson, PharmD, BCACP, CPP Clinical Pharmacist [facility] [phone]

## 2023-01-26 ENCOUNTER — Ambulatory Visit (INDEPENDENT_AMBULATORY_CARE_PROVIDER_SITE_OTHER): Payer: 59 | Admitting: Family Medicine

## 2023-02-03 NOTE — Progress Notes (Signed)
Leslie Davis, D.O.  ABFM, ABOM Specializing in Clinical Bariatric Medicine  Office located at: [address] [place], [place]  [address]   Assessment and Plan:   FOR THE DISEASE OF OBESITY: BMI 36.0-36.9,adult current bmi 36.75 -     Amb Referral To Provider Referral Exercise Program (P.R.E.P) Generalized obesity: Starting BMI 35.6 -     Amb Referral To Provider Referral Exercise Program (P.R.E.P)  Since last office visit on 01/05/2023 patient's  Muscle mass has increased by 0.6lb. Fat mass has increased by 3.4lb. Total body water has increased by 1.6lb.  Counseling done on how various foods will affect these numbers and how to maximize success  Total lbs lost to date: 7 Total weight loss percentage to date: 3.37%    Recommended Dietary Goals Leslie Davis is currently in the action stage of change. As such, her goal is to continue weight management plan.  She has agreed to: continue current plan  - I recommended the PB2 powder in replacement of peanut butter.   - I will refer her to PREP at the [facility]    Behavioral Intervention We discussed the following Behavioral Modification Strategies today: increasing lean protein intake to established goals, decreasing simple carbohydrates , increasing vegetables, avoiding skipping meals, continue to work on implementation of reduced calorie nutritional plan, and continue to work on maintaining a reduced calorie state, getting the recommended amount of protein, incorporating whole foods, making healthy choices, staying well hydrated and practicing mindfulness when eating..  Additional resources provided today: Holiday eating strategies   Evidence-based interventions for health behavior change were utilized today including the discussion of self monitoring techniques, problem-solving barriers and SMART goal setting techniques.   Regarding patient's less desirable eating habits and patterns, we employed the technique of  small changes.   Pt will specifically work on: Calculating snack calories for next visit and increase exercise.    Recommended Physical Activity Goals Leslie Davis has been advised to work up to 150 minutes of moderate intensity aerobic activity a week and strengthening exercises 2-3 times per week for cardiovascular health, weight loss maintenance and preservation of muscle mass.   She has agreed to : Think about enjoyable ways to increase daily physical activity and overcoming barriers to exercise and continue to gradually increase the amount and intensity of exercise    Pharmacotherapy We discussed various medication options to help Leslie Davis with her weight loss efforts and we both agreed to : continue with nutritional and behavioral strategies   FOR ASSOCIATED CONDITIONS ADDRESSED TODAY: Hypertension associated with type 2 diabetes mellitus (HCC) Assessment: Condition is Not optimized.Marland Kitchen Pt BP today is low at 88/55 and when repeated it was 110/62. She currently takes Norvasc once daily, Coreg BID, Edarbi once daily and tolerates these medications well. She took her medications this morning before her appointment. She is asymptomatic and denies any dizziness or light-headiness when standing.  Last 3 blood pressure readings in our office are as follows: BP Readings from Last 3 Encounters:  02/07/23 (!) 88/55  01/05/23 135/82  01/04/23 (!) 140/78    Plan: - Continue with Norvasc 10mg , Coreg 3.125mg , and Edarbi 80mg  as directed.   - I recommended that she cut her Coreg in half as this makes it challenging to lose weight and possibly improve her BP but she is to discuss this with her PCP.   - Leslie Davis BP is below goal today.   - Lifestyle changes such as following our low salt, heart  healthy meal plan and engaging in a regular exercise program discussed   - Ambulatory blood pressure monitoring daily encouraged.  Reminded patient that if they ever feel poorly in any way, to check  their blood pressure and pulse as well.  - We will continue to monitor closely alongside PCP/ specialists.  Pt reminded to also f/up with those individuals as instructed by them.  Hypertension associated with type 2 diabetes mellitus (HCC) -     Amb Referral To Provider Referral Exercise Program (P.R.E.P)   Type 2 diabetes mellitus with obesity (HCC)  Assessment: Condition is Not optimized.Marland Kitchen Pt A1c has improved and she is currently taking 10 units of Tresiba daily, Humalog TID, and Farxiga 10 mg daily per cardiology. She endorses having some hunger and cravings. Pt monitors her blood sugar BID and occasionally 3 times daily.  Lab Results  Component Value Date   HGBA1C 8.9 (H) 01/04/2023   HGBA1C 9.1 (H) 10/07/2022   HGBA1C 6.8 (H) 07/05/2022    Plan: - Continue Farxiga, Tresiba, and Humalog as directed.   - I recommended pt look into getting a Dexcon to monitor her blood sugar.   - Intensive lifestyle modification including diet, exercise and weight loss are the first line of treatment for diabetes. We extensively discussed the importance of decreasing simple carbs, increasing proteins and how certain foods they eat will affect their blood sugars.  - Reminded Leslie Davis if she feels poorly- check Blood Sugar and Blood Pressure at that time.     - Hypoglycemia prevention discussed with the patient.  Eat on a regular basis- no skipping or going long periods without eating.      - Recommend that any concerns about medicines should be directed at the prescribing provider and importance of f/up with PCP and all other specialists, as scheduled, was stressed to the patient today Type 2 diabetes mellitus with obesity (HCC) -     Amb Referral To Provider Referral Exercise Program (P.R.E.P)    FOLLOW UP: Return in about 4 weeks (around 03/07/2023). She was informed of the importance of frequent follow up visits to maximize her success with intensive lifestyle modifications for her  multiple health conditions.  Subjective:   Chief complaint: Obesity Leslie Davis is here to discuss her progress with her obesity treatment plan. She is on the Category 1 Plan with B/L options and states she is following her eating plan approximately 80% of the time. She states she is chair exercises 10 minutes 5 days per week.  Interval History:  Leslie Davis is here for a follow up office visit. Since last OV,  she has been well. She informed me that she has stopped going to [employer] as sometimes she does not feel like doing nothing. Although she stopped going to the gym she tries to do her chair exercises.   Barriers identified: low volume of physical activity at present .   Pharmacotherapy for weight loss: She is currently taking no anti-obesity medication.   Review of Systems:  Pertinent positives were addressed with patient today.  Reviewed by clinician on day of visit: allergies, medications, problem list, medical history, surgical history, family history, social history, and previous encounter notes.  Weight Summary and Biometrics   Weight Lost Since Last Visit: 0lb  Weight Gained Since Last Visit: 4lb    Vitals Temp: 97.8 F (36.6 C) BP: (!) 88/55 Pulse Rate: (!) 56 SpO2: 98 %   Anthropometric Measurements Height: 5\' 2"  (1.575 m) Weight: 201  lb (91.2 kg) BMI (Calculated): 36.75 Weight at Last Visit: 197lb Weight Lost Since Last Visit: 0lb Weight Gained Since Last Visit: 4lb Starting Weight: 208lb Total Weight Loss (lbs): 7 lb (3.175 kg) Peak Weight: 208lb   Body Composition  Body Fat %: 47.7 % Fat Mass (lbs): 95.8 lbs Muscle Mass (lbs): 99.8 lbs Total Body Water (lbs): 68.6 lbs Visceral Fat Rating : 15   Other Clinical Data Fasting: no Labs: no Today's Visit #: 24 Starting Date: 07/23/20    Objective:   PHYSICAL EXAM: Blood pressure (!) 88/55, pulse (!) 56, temperature 97.8 F (36.6 C), height 5\' 2"  (1.575 m), weight 201 lb (91.2 kg), SpO2  98%. Body mass index is 36.76 kg/m.  General: she is overweight, cooperative and in no acute distress. PSYCH: Has normal mood, affect and thought process.   HEENT: EOMI, sclerae are anicteric. Lungs: Normal breathing effort, no conversational dyspnea. Extremities: Moves * 4 Neurologic: A and O * 3, good insight  DIAGNOSTIC DATA REVIEWED: BMET    Component Value Date/Time   NA 139 01/04/2023 1031   K 5.4 (H) 01/04/2023 1031   CL 104 01/04/2023 1031   CO2 22 01/04/2023 1031   GLUCOSE 247 (H) 01/04/2023 1031   GLUCOSE 201 (H) 08/06/2022 0809   BUN 35 (H) 01/04/2023 1031   CREATININE 1.39 (H) 01/04/2023 1031   CREATININE 0.74 08/30/2011 1307   CALCIUM 9.9 01/04/2023 1031   GFRNONAA 57 (L) 08/06/2022 0809   GFRAA 57 (L) 02/21/2020 1500   Lab Results  Component Value Date   HGBA1C 8.9 (H) 01/04/2023   HGBA1C (H) 09/12/2007    9.4 (NOTE)   The ADA recommends the following therapeutic goal for glycemic   control related to Hgb A1C measurement:   Goal of Therapy:   < 7.0% Hgb A1C   Reference: American Diabetes Association: Clinical Practice   Recommendations 2008, Diabetes Care,  2008, 31:(Suppl 1).   No results found for: "INSULIN" Lab Results  Component Value Date   TSH 1.640 06/17/2021   CBC    Component Value Date/Time   WBC 4.8 01/04/2023 1031   WBC 6.2 08/06/2022 0809   RBC 4.74 01/04/2023 1031   RBC 3.79 (L) 08/06/2022 0809   HGB 13.3 01/04/2023 1031   HCT 41.4 01/04/2023 1031   PLT 237 01/04/2023 1031   MCV 87 01/04/2023 1031   MCH 28.1 01/04/2023 1031   MCH 28.2 08/06/2022 0809   MCHC 32.1 01/04/2023 1031   MCHC 32.0 08/06/2022 0809   RDW 13.6 01/04/2023 1031   Iron Studies    Component Value Date/Time   IRON 68 11/16/2017 0747   TIBC 355 11/16/2017 0747   FERRITIN 45.2 11/16/2017 0747   IRONPCTSAT 19 11/16/2017 0747   Lipid Panel     Component Value Date/Time   CHOL 210 (H) 01/04/2023 1031   TRIG 94 01/04/2023 1031   HDL 105 01/04/2023 1031    CHOLHDL 2.0 01/04/2023 1031   CHOLHDL 2.8 09/13/2007 0405   VLDL 17 09/13/2007 0405   LDLCALC 89 01/04/2023 1031   Hepatic Function Panel     Component Value Date/Time   PROT 7.2 01/04/2023 1031   ALBUMIN 4.1 01/04/2023 1031   AST 14 01/04/2023 1031   ALT 16 01/04/2023 1031   ALKPHOS 121 01/04/2023 1031   BILITOT 0.3 01/04/2023 1031   BILIDIR 0.1 09/06/2019 2304   BILIDIR 0.09 11/08/2018 1017   IBILI 0.4 09/06/2019 2304      Component Value Date/Time  TSH 1.640 06/17/2021 1606   Nutritional Lab Results  Component Value Date   VD25OH 32.5 07/05/2022   VD25OH 48.3 09/14/2021   VD25OH 82.1 03/31/2021    Attestations:   I, Clinical biochemist, acting as a Stage manager for [employer], DO., have compiled all relevant documentation for today's office visit on behalf of Thomasene Lot, DO, while in the presence of [employer], DO.  Reviewed by clinician on day of visit: allergies, medications, problem list, medical history, surgical history, family history, social history, and previous encounter notes pertinent to patient's obesity diagnosis. I have spent 45 minutes in the care of the patient today including:preparing to see patient (e.g. review and interpretation of tests, old notes ), obtaining and/or reviewing separately obtained history, performing a medically appropriate examination or evaluation, counseling and educating the patient, ordering medications, test or procedures, documenting clinical information in the electronic or other health care record, and independently interpreting results and communicating results to the patient, family, or caregiver   I have reviewed the above documentation for accuracy and completeness, and I agree with the above. Leslie Davis, D.O.  The 21st Century Cures Act was signed into law in 2016 which includes the topic of electronic health records.  This provides immediate access to information in MyChart.  This includes consultation  notes, operative notes, office notes, lab results and pathology reports.  If you have any questions about what you read please let us know at your next visit so we can discuss your concerns and take corrective action if need be.  We are right here with you.

## 2023-02-04 ENCOUNTER — Encounter: Payer: Self-pay | Admitting: Gastroenterology

## 2023-02-07 ENCOUNTER — Encounter (INDEPENDENT_AMBULATORY_CARE_PROVIDER_SITE_OTHER): Payer: Self-pay | Admitting: Family Medicine

## 2023-02-07 ENCOUNTER — Ambulatory Visit (INDEPENDENT_AMBULATORY_CARE_PROVIDER_SITE_OTHER): Payer: 59 | Admitting: Family Medicine

## 2023-02-07 VITALS — BP 110/62 | HR 56 | Temp 97.8°F | Ht 62.0 in | Wt 201.0 lb

## 2023-02-07 DIAGNOSIS — E1159 Type 2 diabetes mellitus with other circulatory complications: Secondary | ICD-10-CM | POA: Diagnosis not present

## 2023-02-07 DIAGNOSIS — E1169 Type 2 diabetes mellitus with other specified complication: Secondary | ICD-10-CM | POA: Diagnosis not present

## 2023-02-07 DIAGNOSIS — E669 Obesity, unspecified: Secondary | ICD-10-CM | POA: Diagnosis not present

## 2023-02-07 DIAGNOSIS — I152 Hypertension secondary to endocrine disorders: Secondary | ICD-10-CM

## 2023-02-07 DIAGNOSIS — Z7984 Long term (current) use of oral hypoglycemic drugs: Secondary | ICD-10-CM | POA: Diagnosis not present

## 2023-02-07 DIAGNOSIS — Z6836 Body mass index (BMI) 36.0-36.9, adult: Secondary | ICD-10-CM

## 2023-02-07 DIAGNOSIS — Z794 Long term (current) use of insulin: Secondary | ICD-10-CM

## 2023-02-09 ENCOUNTER — Telehealth: Payer: Self-pay | Admitting: *Deleted

## 2023-02-09 NOTE — Telephone Encounter (Signed)
Contacted regarding PREP Class referral. She was not home an unavailable. Stated she would return my call.

## 2023-02-20 ENCOUNTER — Other Ambulatory Visit: Payer: Self-pay | Admitting: Internal Medicine

## 2023-02-20 DIAGNOSIS — I1310 Hypertensive heart and chronic kidney disease without heart failure, with stage 1 through stage 4 chronic kidney disease, or unspecified chronic kidney disease: Secondary | ICD-10-CM

## 2023-02-20 DIAGNOSIS — I700 Atherosclerosis of aorta: Secondary | ICD-10-CM

## 2023-02-20 DIAGNOSIS — E1169 Type 2 diabetes mellitus with other specified complication: Secondary | ICD-10-CM

## 2023-02-20 DIAGNOSIS — E1122 Type 2 diabetes mellitus with diabetic chronic kidney disease: Secondary | ICD-10-CM

## 2023-02-21 ENCOUNTER — Telehealth: Payer: Self-pay

## 2023-02-21 NOTE — Progress Notes (Signed)
   Care Guide Note  02/21/2023 Name: Leslie Davis MRN: [id number] DOB: Aug 17, 1955  Referred by: Dorothyann Peng, MD Reason for referral : Care Coordination (Outreach to schedule with Pharm d )   Leslie Davis is a 67 y.o. year old female who is a primary care patient of Dorothyann Peng, MD. Leslie Davis was referred to the pharmacist for assistance related to DM.    Successful contact was made with the patient to discuss pharmacy services including being ready for the pharmacist to call at least 5 minutes before the scheduled appointment time, to have medication bottles and any blood sugar or blood pressure readings ready for review. The patient agreed to meet with the pharmacist via with the pharmacist via telephone visit on (date/time).  02/23/2023  Penne Lash , RMA     [place]  [facility], [facility] Guide  Direct Dial: [phone]  Website: [email or web address]

## 2023-02-23 ENCOUNTER — Other Ambulatory Visit: Payer: 59

## 2023-02-23 DIAGNOSIS — E1122 Type 2 diabetes mellitus with diabetic chronic kidney disease: Secondary | ICD-10-CM

## 2023-02-23 DIAGNOSIS — N182 Chronic kidney disease, stage 2 (mild): Secondary | ICD-10-CM

## 2023-02-23 DIAGNOSIS — I1310 Hypertensive heart and chronic kidney disease without heart failure, with stage 1 through stage 4 chronic kidney disease, or unspecified chronic kidney disease: Secondary | ICD-10-CM

## 2023-02-23 DIAGNOSIS — E785 Hyperlipidemia, unspecified: Secondary | ICD-10-CM

## 2023-02-23 DIAGNOSIS — Z794 Long term (current) use of insulin: Secondary | ICD-10-CM

## 2023-02-23 MED ORDER — FREESTYLE LIBRE 3 PLUS SENSOR MISC: 1 refills | Status: DC

## 2023-02-23 MED ORDER — FREESTYLE LIBRE 3 READER DEVI: 0 refills | Status: DC

## 2023-02-23 NOTE — Progress Notes (Signed)
02/23/2023 Name: Leslie Davis MRN: [id number] DOB: 07-29-1955  Chief Complaint  Patient presents with   Medication Management   Diabetes    Leslie Davis is a 67 y.o. year old female who presented for a telephone visit.   They were referred to the pharmacist by their PCP for assistance in managing diabetes, hypertension, and hyperlipidemia.    Subjective:  Care Team: Primary Care Provider: Dorothyann Peng, MD ; Next Scheduled Visit: 07/06/23  Medication Access/Adherence  Current Pharmacy:  [facility] - [place], [place] - [address] [place] [address] Phone: [phone] Fax: [phone]  [facility] - [place], [place] - [address] [place] [address] Phone: [phone] Fax: [phone]  [id number] [place], [place] - [address] [place] [address] Phone: [phone] Fax: [phone]  [facility] - [address], [place] - [address] [place] [address] Phone: [phone] Fax: [phone]  [facility] [place], [place] - [address] [place] [address] Phone: [phone] Fax: [phone]   Patient reports affordability concerns with their medications: No  Patient reports access/transportation concerns to their pharmacy: No  Patient reports adherence concerns with their medications:  No     Diabetes:  Current medications: Tresiba 10 units daily, Humalog 0-6 units with meals per sliding scale; Farxiga 10 mg daily - Previously on Ozempic, stopped due to neuroendocrine tumor  Current glucose readings: fastings: reports usually 150-200s;   Patient denies hypoglycemic s/sx including dizziness, shakiness, sweating. Reports an episode of hypoglycemia several months ago when she mixed up her insulins -  has not done that since  Current meal patterns:  - Breakfast: [place] sausage, eggs, toast; coffee - Lunch: [place] sandwich; vegetables; minimizing starches;  - Supper: salad; sometimes cooks - greens, hamburger; small serving of starch - Drinks: lemon water; very infrequent coke zero  Current physical activity: chair exercises at home; but working on getting into PREP class  Current medication access support: Medicare + Medicaid   Hypertension and CKD:  Current medications: amlodipine 10 mg daily, carvedilol 3.125 mg twice daily, Edarbi 80 mg daily,   Reports she has not seen a nephrologist.   Hyperlipidemia/ASCVD Risk Reduction  Current lipid lowering medications: rosuvastatin 20 mg daily, ezetimibe 10 mg daily   Antiplatelet regimen: aspirin 81 mg daily    Objective:  Lab Results  Component Value Date   HGBA1C 8.9 (H) 01/04/2023    Lab Results  Component Value Date   CREATININE 1.39 (H) 01/04/2023   BUN 35 (H) 01/04/2023   NA 139 01/04/2023   K 5.4 (H) 01/04/2023   CL 104 01/04/2023   CO2 22 01/04/2023    Lab Results  Component Value Date   CHOL 210 (H) 01/04/2023   HDL 105 01/04/2023   LDLCALC 89 01/04/2023   TRIG 94 01/04/2023   CHOLHDL 2.0 01/04/2023    Medications Reviewed Today     Reviewed by Alden Hipp, RPH-CPP (Pharmacist) on 02/23/23 at 1503  Med List Status: <None>   Medication Order Taking? Sig Documenting Provider Last Dose Status Informant  acetaminophen (TYLENOL) 500 MG tablet [id number] Yes Take 1 tablet (500 mg total) by mouth every 6 (six) hours as needed for mild pain. Fritzi Mandes, MD Taking Active   ALLERGY RELIEF CETIRIZINE 10 MG tablet [id number]  Yes TAKE 1 TABLET BY MOUTH ONCE DAILY Dorothyann Peng, MD Taking Active   amLODipine (NORVASC) 10 MG tablet [id number] Yes TAKE ONE TABLET BY MOUTH ONCE DAILY Arnette Felts, FNP Taking Active   aspirin EC (ASPIRIN LOW DOSE) 81 MG tablet [id number] Yes TAKE 1 TABLET BY MOUTH ONCE DAILY  *SWALLOW WHOLE* *REFILL REQUESTDorothyann Peng, MD Taking Active   Blood Glucose Monitoring Suppl [facility] VERIO) w/Device KIT [id number]  Use as directed to check blood sugars 2 times per day dx: e11.65 Dorothyann Peng, MD  Active Self  carvedilol (COREG) 3.125 MG tablet [id number] Yes TAKE ONE TABLET BY MOUTH TWICE DAILY WITH A MEAL Tolia, Sunit, DO Taking Active   celecoxib (CELEBREX) 200 MG capsule [id number] Yes Take 1 capsule (200 mg total) by mouth 2 (two) times daily as needed. Cristie Hem, PA-C Taking Active Self           Med Note Clearance Coots, Desma Paganini Feb 23, 2023  3:01 PM) As needed  clindamycin (CLEOCIN T) 1 % lotion [id number] Yes Apply 1 Application topically daily as needed (acne). [provider] Taking Active Self  EDARBI 80 MG TABS [id number] Yes TAKE ONE TABLET BY MOUTH ONCE DAILY Dorothyann Peng, MD Taking Active   ezetimibe (ZETIA) 10 MG tablet [id number] Yes Take 1 tablet (10 mg total) by mouth daily. Tessa Lerner, DO Taking Active Self  FARXIGA 10 MG TABS tablet [id number] Yes TAKE ONE TABLET BY MOUTH BEFORE BREAKFAST daily Dorothyann Peng, MD Taking Active   ferrous gluconate (FERGON) 324 MG tablet [id number] Yes TAKE 1 TABLET BY MOUTH TWICE DAILY WITH A MEAL. Mansouraty, Netty Starring., MD Taking Active Self  glucose blood [facility] VERIO) test strip [id number] Yes USE TO TEST BLOOD SUGAR TWICE DAILY AS DIRECTED Dorothyann Peng, MD Taking Active   insulin lispro (HUMALOG) 100 UNIT/ML KwikPen [id number] Yes Inject 0-6 Units into the skin 3 (three) times daily with meals. Check Blood Glucose (BG) and inject per scale: BG <150= 0 unit; BG 150-200= 1 unit; BG 201-250= 2 unit; BG 251-300= 3 unit; BG 301-350= 4 unit; BG 351-400= 5 unit; BG >400= 6 unit and Call Primary Care. Noralee Stain, DO Taking Active   Insulin Pen Needle (PEN NEEDLES) 31G X 5 MM MISC [id number] Yes 1 each by Does not apply route 3 (three) times daily. May dispense any manufacturer covered by patient's  insurance. Noralee Stain, DO Taking Active   linaclotide [facility]) 145 MCG CAPS capsule [id number] Yes Take 1 capsule (145 mcg total) by mouth daily. Whitmire, [name], FNP Taking Active   nitroGLYCERIN (NITROSTAT) 0.4 MG SL tablet [id number] No Place 1 tablet (0.4 mg total) under the tongue every 5 (five) minutes as needed for chest pain. If you require more than two tablets five minutes apart go to the nearest ER via EMS.  Patient not taking: Reported on 02/23/2023   Tessa Lerner, DO Not Taking Expired 08/26/22 2359 Self  ondansetron (ZOFRAN) 4 MG tablet [id number] Yes TAKE 1 TABLET BY MOUTH EVERY 8 HOURS AS NEEDED FOR NAUSEA & VOMITING Dorothyann Peng, MD Taking Active   OneTouch Delica Lancets 33G [place] [id number] Yes Use as directed to check blood sugars 2 times per day dx:e11.65 Dorothyann Peng, MD Taking Active Self  pantoprazole (PROTONIX) 40 MG tablet [id number] Yes TAKE ONE (1) TABLET BY MOUTH TWICE DAILY Dorothyann Peng, MD Taking Active   polyethylene glycol (MIRALAX / GLYCOLAX) 17 g packet [id number] Yes Take 17 g by mouth daily as needed for mild  constipation. Fritzi Mandes, MD Taking Active   rosuvastatin (CRESTOR) 20 MG tablet [id number] Yes TAKE ONE TABLET BY MOUTH ONCE DAILY Dorothyann Peng, MD Taking Active   temazepam (RESTORIL) 30 MG capsule [id number] Yes TAKE 1 CAPSULE BY MOUTH AT BEDTIME AS NEEDED *REFILL REQUESTDorothyann Peng, MD Taking Active   traMADol Janean Sark) 50 MG tablet [id number] Yes Take 1-2 tablets (50-100 mg total) by mouth daily as needed. Tarry Kos, MD Taking Active   TRESIBA FLEXTOUCH 100 UNIT/ML FlexTouch Pen [id number] Yes INJECT 10 UNITS SUBCUTANEOUSLY AT BEDTIME [provider] Taking Active   TRUEPLUS PEN NEEDLES 31G X 6 MM MISC [id number] Yes USE WITH pen TO INJECT insulin DAILY Arnette Felts, FNP Taking Active Self              Assessment/Plan:   Diabetes: - Currently uncontrolled - Reviewed long term cardiovascular and renal outcomes of  uncontrolled blood sugar - Reviewed goal A1c, goal fasting, and goal 2 hour post prandial glucose - Discussed CGM. Patient is interested. Order for [place] 3 reader and sensor sent to [facility].  - Recommend to increase Tresiba to 12 units daily. If fasting readings remain >150 after 1 week, increase to 14 units daily. Patient verbalizes understanding.  - Recommend to check glucose using CGM. Appointment scheduled in 2 weeks for education  Hypertension: - Currently controlled - Recommend to continue current regimen at this time. Will discuss referral to nephrology with PCP   Hyperlipidemia/ASCVD Risk Reduction: - Currently uncontrolled.  - Recommend to consider rosuvastatin dose increase moving forward, as long as eGFR remains >30.    Follow Up Plan: in office in 2 weeks  Catie TClearance Coots, PharmD, BCACP, CPP Clinical Pharmacist [facility] [phone]

## 2023-02-24 ENCOUNTER — Other Ambulatory Visit: Payer: Self-pay | Admitting: Cardiology

## 2023-02-24 DIAGNOSIS — E1169 Type 2 diabetes mellitus with other specified complication: Secondary | ICD-10-CM

## 2023-02-24 DIAGNOSIS — I2510 Atherosclerotic heart disease of native coronary artery without angina pectoris: Secondary | ICD-10-CM

## 2023-02-28 ENCOUNTER — Ambulatory Visit: Payer: 59 | Attending: Cardiology | Admitting: Cardiology

## 2023-03-07 ENCOUNTER — Ambulatory Visit: Payer: 59 | Admitting: Pharmacist

## 2023-03-07 DIAGNOSIS — E1169 Type 2 diabetes mellitus with other specified complication: Secondary | ICD-10-CM

## 2023-03-07 DIAGNOSIS — E785 Hyperlipidemia, unspecified: Secondary | ICD-10-CM

## 2023-03-07 MED ORDER — TRESIBA FLEXTOUCH 100 UNIT/ML ~~LOC~~ SOPN: PEN_INJECTOR | SUBCUTANEOUS | 1 refills | Status: DC

## 2023-03-07 NOTE — Patient Instructions (Signed)
Ms. Deslauriers,   It was great talking to you today!  Increase Tresiba to 18 units daily. If after a week, your fasting readings remain greater than 150 and you are NOT having low blood sugars, please increase to 20 units daily.   Please reach out with any questions!  Catie Eppie Gibson, PharmD, BCACP, CPP Clinical Pharmacist [facility] [phone]

## 2023-03-07 NOTE — Progress Notes (Unsigned)
03/09/2023 Name: Leslie Davis MRN: [id number] DOB: 08-31-1955  Chief Complaint  Patient presents with   Medication Management   Diabetes    Leslie Davis is a 67 y.o. year old female who presented for a telephone visit.   They were referred to the pharmacist by their PCP for assistance in managing diabetes.    Subjective:  Care Team: Primary Care Provider: Dorothyann Peng, MD ; Next Scheduled Visit: 07/06/23  Medication Access/Adherence  Current Pharmacy:  [facility] [place], [place] - [address] [place] [address] Phone: [phone] Fax: [phone]  [id number] [place], [place] - [address] [place] [address] Phone: [phone] Fax: [phone]  [facility] - [address] [place] - [address] [place] [address] Phone: [phone] Fax: [phone]  [facility] - [place], [place] - [address] [place] [address] Phone: [phone] Fax: [phone]   Patient reports affordability concerns with their medications: No  Patient reports access/transportation concerns to their pharmacy: No  Patient reports adherence concerns with their medications:  No     Diabetes:  Current medications: Tresiba 14 units daily, Novolog 0-6 units per sliding scale, Farxiga 10 mg daily  Medications tried in the past: Previously on Ozempic, stopped due to neuroendocrine tumor   Current glucose readings: reports fastings remain elevated >150, generally in 180s  Patient presented with [facility] 3 plus CGM and reader.    Objective:  Lab Results  Component Value Date   HGBA1C 8.9 (H) 01/04/2023    Lab Results  Component Value Date   CREATININE 1.39 (H) 01/04/2023   BUN 35 (H) 01/04/2023   NA 139 01/04/2023   K 5.4 (H) 01/04/2023   CL 104 01/04/2023   CO2 22 01/04/2023     Lab Results  Component Value Date   CHOL 210 (H) 01/04/2023   HDL 105 01/04/2023   LDLCALC 89 01/04/2023   TRIG 94 01/04/2023   CHOLHDL 2.0 01/04/2023    Medications Reviewed Today     Reviewed by Alden Hipp, RPH-CPP (Pharmacist) on 03/09/23 at 1058  Med List Status: <None>   Medication Order Taking? Sig Documenting Provider Last Dose Status Informant  acetaminophen (TYLENOL) 500 MG tablet [id number]  Take 1 tablet (500 mg total) by mouth every 6 (six) hours as needed for mild pain. Fritzi Mandes, MD  Active   ALLERGY RELIEF CETIRIZINE 10 MG tablet [id number]  TAKE 1 TABLET BY MOUTH ONCE DAILY Dorothyann Peng, MD  Active   amLODipine (NORVASC) 10 MG tablet [id number]  TAKE ONE TABLET BY MOUTH ONCE DAILY Arnette Felts, FNP  Active   aspirin EC (ASPIRIN LOW DOSE) 81 MG tablet [id number]  TAKE 1 TABLET BY MOUTH ONCE DAILY *SWALLOW WHOLE* *REFILL REQUESTDorothyann Peng, MD  Active   Blood Glucose Monitoring Suppl [facility] VERIO) w/Device KIT [id number]  Use as directed to check blood sugars 2 times per day dx: e11.65 Dorothyann Peng, MD  Active Self  carvedilol (COREG) 3.125 MG tablet [id number]  TAKE ONE TABLET BY MOUTH TWICE DAILY WITH A MEAL Tolia, Sunit, DO  Active   celecoxib (CELEBREX) 200 MG capsule [id number]  Take 1 capsule (200 mg total) by mouth 2 (two) times daily as needed. Cristie Hem, PA-C  Active Self           Med Note (Kristianna Saperstein  T   Wed Feb 23, 2023  3:01 PM) As needed  clindamycin (CLEOCIN T) 1 % lotion [id number]  Apply 1 Application topically daily as needed (acne). [provider]  Active Self  Continuous Glucose Receiver (FREESTYLE LIBRE 3 READER) DEVI [id number]  Use to check glucose continuously Dorothyann Peng, MD  Active   Continuous Glucose Sensor (FREESTYLE LIBRE 3 PLUS SENSOR) MISC [id number]  Change sensor every 15 days. Dorothyann Peng, MD  Active   EDARBI 80 MG TABS [id number]  TAKE ONE TABLET BY MOUTH ONCE DAILY Dorothyann Peng, MD   Active   ezetimibe (ZETIA) 10 MG tablet [id number]  TAKE 1 TABLET BY MOUTH ONCE DAILY Tolia, Sunit, DO  Active   FARXIGA 10 MG TABS tablet [id number]  TAKE ONE TABLET BY MOUTH BEFORE BREAKFAST daily Dorothyann Peng, MD  Active   ferrous gluconate (FERGON) 324 MG tablet [id number]  TAKE 1 TABLET BY MOUTH TWICE DAILY WITH A MEAL. Mansouraty, Netty Starring., MD  Active Self  glucose blood [facility] VERIO) test strip [id number]  USE TO TEST BLOOD SUGAR TWICE DAILY AS DIRECTED Dorothyann Peng, MD  Active   insulin lispro (HUMALOG) 100 UNIT/ML KwikPen [id number]  Inject 0-6 Units into the skin 3 (three) times daily with meals. Check Blood Glucose (BG) and inject per scale: BG <150= 0 unit; BG 150-200= 1 unit; BG 201-250= 2 unit; BG 251-300= 3 unit; BG 301-350= 4 unit; BG 351-400= 5 unit; BG >400= 6 unit and Call Primary Care. Noralee Stain, DO  Active   Insulin Pen Needle (PEN NEEDLES) 31G X 5 MM MISC [id number]  1 each by Does not apply route 3 (three) times daily. May dispense any manufacturer covered by patient's insurance. Noralee Stain, DO  Active   linaclotide [facility]) 145 MCG CAPS capsule [id number]  Take 1 capsule (145 mcg total) by mouth daily. Whitmire, [name], FNP  Active   nitroGLYCERIN (NITROSTAT) 0.4 MG SL tablet [id number]  Place 1 tablet (0.4 mg total) under the tongue every 5 (five) minutes as needed for chest pain. If you require more than two tablets five minutes apart go to the nearest ER via EMS.  Patient not taking: Reported on 02/23/2023   Tessa Lerner, DO  Expired 08/26/22 2359 Self  ondansetron (ZOFRAN) 4 MG tablet [id number]  TAKE 1 TABLET BY MOUTH EVERY 8 HOURS AS NEEDED FOR NAUSEA & VOMITING Dorothyann Peng, MD  Active   OneTouch Delica Lancets 33G [place] [id number]  Use as directed to check blood sugars 2 times per day dx:e11.65 Dorothyann Peng, MD  Active Self  pantoprazole (PROTONIX) 40 MG tablet [id number]  TAKE ONE (1) TABLET BY MOUTH TWICE DAILY Dorothyann Peng, MD  Active   polyethylene glycol  (MIRALAX / GLYCOLAX) 17 g packet [id number]  Take 17 g by mouth daily as needed for mild constipation. Fritzi Mandes, MD  Active   rosuvastatin (CRESTOR) 20 MG tablet [id number]  TAKE ONE TABLET BY MOUTH ONCE DAILY Dorothyann Peng, MD  Active   temazepam (RESTORIL) 30 MG capsule [id number]  TAKE 1 CAPSULE BY MOUTH AT BEDTIME AS NEEDED *REFILL REQUESTDorothyann Peng, MD  Active   traMADol (ULTRAM) 50 MG tablet [id number]  Take 1-2 tablets (50-100 mg total) by mouth daily as needed. Tarry Kos, MD  Active   TRESIBA FLEXTOUCH 100 UNIT/ML FlexTouch Pen [id number]  Inject 18 units daily. Max daily dose 30 units. Dorothyann Peng, MD  Active   TRUEPLUS PEN NEEDLES 31G X 6 MM MISC [id number]  USE WITH pen  TO INJECT insulin DAILY Arnette Felts, FNP  Active Self              Assessment/Plan:   Diabetes: - Currently uncontrolled - Reviewed long term cardiovascular and renal outcomes of uncontrolled blood sugar - Reviewed goal A1c, goal fasting, and goal 2 hour post prandial glucose - Provided education on use of Libre 3 plus CGM and reader, including sensor placement, patient training, sensor removal. Patient placed her first sensor while in office today.  - Recommend to increase Tresiba to 18 units daily due to continued fasting elevations. If fasting readings remain greater than 150 after 1 week, increase to 20 units daily.  - Recommend to check glucose using CGM.    Follow Up Plan: in person follow up in 2 weeks  Catie Eppie Gibson, PharmD, BCACP, CPP Clinical Pharmacist [facility] [phone]

## 2023-03-14 ENCOUNTER — Encounter: Payer: Self-pay | Admitting: Internal Medicine

## 2023-03-15 ENCOUNTER — Other Ambulatory Visit: Payer: Self-pay | Admitting: Internal Medicine

## 2023-03-15 DIAGNOSIS — N1831 Chronic kidney disease, stage 3a: Secondary | ICD-10-CM

## 2023-03-21 ENCOUNTER — Ambulatory Visit: Payer: 59 | Admitting: Pharmacist

## 2023-03-21 DIAGNOSIS — E1122 Type 2 diabetes mellitus with diabetic chronic kidney disease: Secondary | ICD-10-CM

## 2023-03-21 DIAGNOSIS — N182 Chronic kidney disease, stage 2 (mild): Secondary | ICD-10-CM

## 2023-03-21 DIAGNOSIS — Z794 Long term (current) use of insulin: Secondary | ICD-10-CM | POA: Diagnosis not present

## 2023-03-21 MED ORDER — TRESIBA FLEXTOUCH 100 UNIT/ML ~~LOC~~ SOPN: PEN_INJECTOR | SUBCUTANEOUS | Status: DC

## 2023-03-21 MED ORDER — INSULIN LISPRO (1 UNIT DIAL) 100 UNIT/ML (KWIKPEN): 0.0000 [IU] | PEN_INJECTOR | Freq: Three times a day (TID) | SUBCUTANEOUS | 3 refills | Status: AC

## 2023-03-21 MED ORDER — FREESTYLE LIBRE 3 PLUS SENSOR MISC: 1 refills | Status: DC

## 2023-03-21 NOTE — Patient Instructions (Addendum)
Increase Tresiba to 22 units daily.   Increase your Humalog sliding scale:  - 70-150: 2 units - 150-199: 3 units - 200-249: 4 units - 250-299: 5 units - 300-349: 6 units  If your sensor does not work after the 60 minute start up time, please call customer service at [phone]. Ask the pharmacy for a refill in the meantime.

## 2023-03-22 ENCOUNTER — Ambulatory Visit
Admission: RE | Admit: 2023-03-22 | Discharge: 2023-03-22 | Disposition: A | Discharge status: Home / Self Care | Payer: 59 | Source: Ambulatory Visit | Attending: Internal Medicine | Admitting: Internal Medicine

## 2023-03-22 ENCOUNTER — Other Ambulatory Visit: Payer: Self-pay | Admitting: Cardiology

## 2023-03-22 DIAGNOSIS — I2510 Atherosclerotic heart disease of native coronary artery without angina pectoris: Secondary | ICD-10-CM

## 2023-03-22 DIAGNOSIS — N1831 Chronic kidney disease, stage 3a: Secondary | ICD-10-CM

## 2023-03-22 DIAGNOSIS — N189 Chronic kidney disease, unspecified: Secondary | ICD-10-CM | POA: Diagnosis not present

## 2023-03-29 ENCOUNTER — Ambulatory Visit (INDEPENDENT_AMBULATORY_CARE_PROVIDER_SITE_OTHER): Payer: 59 | Admitting: Family Medicine

## 2023-03-31 ENCOUNTER — Ambulatory Visit (INDEPENDENT_AMBULATORY_CARE_PROVIDER_SITE_OTHER): Payer: 59 | Admitting: Orthopaedic Surgery

## 2023-03-31 ENCOUNTER — Other Ambulatory Visit (INDEPENDENT_AMBULATORY_CARE_PROVIDER_SITE_OTHER): Payer: 59

## 2023-03-31 DIAGNOSIS — Z96652 Presence of left artificial knee joint: Secondary | ICD-10-CM | POA: Diagnosis not present

## 2023-03-31 NOTE — Progress Notes (Signed)
 Office Visit Note   Patient: Leslie Davis           Date of Birth: Aug 23, 1955           MRN: [id number] Visit Date: 03/31/2023              Requested by: Jarold Medici, MD [address] [place],  [place] [address] PCP: Jarold Medici, MD   Assessment & Plan: Visit Diagnoses:  1. Status post total left knee replacement     Plan: Leslie Davis is a 68 year old female 2 years status post left total knee replacement.  Continues to be in pain.  Possible femoral component loosening.  Infection workup was negative.  Currently A1c is 8.9 which would need to improve before she is a surgical candidate.  She will think about her options and get back in touch with us .  She has good flexibility and collaterals feel stable.  Follow-Up Instructions: No follow-ups on file.   Orders:  Orders Placed This Encounter  Procedures   XR Knee 1-2 Views Left   No orders of the defined types were placed in this encounter.     Procedures: No procedures performed   Clinical Data: No additional findings.   Subjective: Chief Complaint  Patient presents with   Left Knee - Pain    HPI Leslie Davis returns today for follow-up evaluation of chronic left knee pain 2 years status post total knee arthroplasty.  Infection workup has been negative.  Reports pain during activity and at rest.  Describes the pain as a throb. Review of Systems  Constitutional: Negative.   HENT: Negative.    Eyes: Negative.   Respiratory: Negative.    Cardiovascular: Negative.   Endocrine: Negative.   Musculoskeletal: Negative.   Neurological: Negative.   Hematological: Negative.   Psychiatric/Behavioral: Negative.    All other systems reviewed and are negative.    Objective: Vital Signs: There were no vitals taken for this visit.  Physical Exam Vitals and nursing note reviewed.  Constitutional:      Appearance: She is well-developed.  HENT:     Head: Atraumatic.     Nose: Nose normal.  Eyes:      Extraocular Movements: Extraocular movements intact.  Cardiovascular:     Pulses: Normal pulses.  Pulmonary:     Effort: Pulmonary effort is normal.  Abdominal:     Palpations: Abdomen is soft.  Musculoskeletal:     Cervical back: Neck supple.  Skin:    General: Skin is warm.     Capillary Refill: Capillary refill takes less than 2 seconds.  Neurological:     Mental Status: She is alert. Mental status is at baseline.  Psychiatric:        Behavior: Behavior normal.        Thought Content: Thought content normal.        Judgment: Judgment normal.     Ortho Exam Examination of the left knee is unchanged from prior visit. Specialty Comments:  No specialty comments available.  Imaging: XR Knee 1-2 Views Left Result Date: 03/31/2023 X-rays of the left knee show prior total knee arthroplasty.  There is couple millimeter gap between the anterior flange of the femoral component in the anterior femoral cortex.  Small Between the posterior femoral condyle and the posterior portion of the femoral implant.  Tibial baseplate appears to be well-fixed.    PMFS History: Patient Active Problem List   Diagnosis Date Noted   Stage 3a chronic kidney disease (HCC)  01/19/2023   Left thigh pain 08/19/2022   Mesenteric mass 08/05/2022   Small bowel mass 08/05/2022   Acute cough 05/02/2022   Nasal congestion 05/02/2022   Type 2 diabetes mellitus with obesity (HCC) 03/24/2022   Generalized obesity 03/24/2022   Atherosclerosis of aorta (HCC) 03/23/2022   Coronary artery disease of native artery of native heart with stable angina pectoris (HCC)    Other constipation 05/25/2021   Class 2 severe obesity due to excess calories with serious comorbidity and body mass index (BMI) of 36.0 to 36.9 in adult (HCC) 12/01/2020   Status post total left knee replacement 10/27/2020   Primary osteoarthritis of left knee    NAFLD (nonalcoholic fatty liver disease) 94/81/7977   Other fatigue 07/23/2020   SOBOE  (shortness of breath on exertion) 07/23/2020   Diabetes mellitus (HCC) 07/23/2020   Hypertension associated with type 2 diabetes mellitus (HCC) 07/23/2020   Hyperlipidemia associated with type 2 diabetes mellitus (HCC) 07/23/2020   Absolute anemia 07/23/2020   Vitamin D  deficiency 07/23/2020   At risk for heart disease 07/23/2020   AKI (acute kidney injury) (HCC) 09/07/2019   Acute kidney injury (HCC) 09/06/2019   Bradycardia 09/06/2019   Hyperglycemia 09/06/2019   HLD (hyperlipidemia)    Hypertensive heart and renal disease 05/16/2019   Erosive gastritis 11/16/2017   Fatty liver 11/16/2017   Abdominal pain 12/11/2012   Early satiety 12/11/2012   Hiatal hernia    Hx of cholecystectomy 02/05/2011   Flu-like symptoms 02/05/2011   CONSTIPATION 08/27/2008   FATTY LIVER DISEASE 05/07/2008   Nausea with vomiting 04/23/2008   EPIGASTRIC PAIN 04/23/2008   RENAL CALCULUS 04/22/2008   Type 2 diabetes mellitus with stage 2 chronic kidney disease, with long-term current use of insulin  (HCC) 05/19/2006   Type 2 diabetes mellitus with hyperlipidemia (HCC) 05/19/2006   HYPERTENSION, BENIGN SYSTEMIC 05/19/2006   GASTROESOPHAGEAL REFLUX, NO ESOPHAGITIS 05/19/2006   AMENORRHEA 05/19/2006   Past Medical History:  Diagnosis Date   CKD (chronic kidney disease)    stage 3   Coronary artery disease 07/2021   obstructive CAD-70% LAD-treated medically   DM (diabetes mellitus) (HCC)    Fatty liver disease, nonalcoholic    Gastritis    Gastroparesis    GERD (gastroesophageal reflux disease)    Heartburn    Hiatal hernia    HLD (hyperlipidemia)    HTN (hypertension)    Joint pain    Renal calculus    Rheumatoid arthritis (HCC)    Vitamin D  deficiency     Family History  Problem Relation Age of Onset   Diabetes Mother    Thyroid  disease Mother    Kidney disease Mother    Hypertension Father    Hyperlipidemia Father    Sudden death Father    Stroke Father    Diabetes Sister    Heart  disease Sister    Kidney disease Sister    COPD Sister    Heart disease Brother 31   Diabetes Brother    Stomach cancer Maternal Aunt        X 2 aunts   Diabetes Maternal Grandmother    Colon cancer Neg Hx    Esophageal cancer Neg Hx    Rectal cancer Neg Hx    Colon polyps Neg Hx     Past Surgical History:  Procedure Laterality Date   BREAST BIOPSY  02/04/2022   MM LT RADIOACTIVE SEED EA ADD LESION LOC MAMMO GUIDE 02/04/2022 [facility]   BREAST BIOPSY  02/04/2022   MM LT RADIOACTIVE SEED LOC MAMMO GUIDE 02/04/2022 [facility]   BREAST LUMPECTOMY WITH RADIOACTIVE SEED LOCALIZATION Left 02/05/2022   Procedure: LEFT BREAST BRACKETED LUMPECTOMY WITH RADIOACTIVE SEED LOCALIZATION;  Surgeon: Curvin Deward MOULD, MD;  Location: [facility];  Service: General;  Laterality: Left;   CHOLECYSTECTOMY     COLONOSCOPY  09/10/2011   normal   JOINT REPLACEMENT  2022   left TKA   LAPAROSCOPIC REMOVAL ABDOMINAL MASS N/A 08/05/2022   Procedure: LAPAROSCOPIC ASSISTED EXCISION OF MESENTERIC MASS;  Surgeon: Dasie Leonor CROME, MD;  Location: [facility];  Service: General;  Laterality: N/A;   LAPAROSCOPIC SMALL BOWEL RESECTION N/A 08/05/2022   Procedure: LAPAROSCOPIC SMALL BOWEL RESECTION;  Surgeon: Dasie Leonor CROME, MD;  Location: [facility];  Service: General;  Laterality: N/A;   LEFT HEART CATH AND CORONARY ANGIOGRAPHY N/A 08/18/2021   Procedure: LEFT HEART CATH AND CORONARY ANGIOGRAPHY;  Surgeon: Elmira Newman PARAS, MD;  Location: [facility];  Service: Cardiovascular;  Laterality: N/A;   TOTAL KNEE ARTHROPLASTY Left 10/27/2020   Procedure: LEFT TOTAL KNEE ARTHROPLASTY;  Surgeon: Jerri Kay HERO, MD;  Location: [facility] OR;  Service: Orthopedics;  Laterality: Left;   TUBAL LIGATION     UPPER GASTROINTESTINAL ENDOSCOPY     Social History   Occupational History   Occupation: Housekeeping    Comment: Education Officer, Environmental  Tobacco Use   Smoking status: Former    Current packs/day:  0.00    Average packs/day: 0.3 packs/day for 1 year (0.3 ttl pk-yrs)    Types: Cigarettes    Start date: 09/09/1973    Quit date: 09/10/1974    Years since quitting: 48.5   Smokeless tobacco: Never   Tobacco comments:    she no longer smokes.   Vaping Use   Vaping status: Never Used  Substance and Sexual Activity   Alcohol use: Never   Drug use: Never   Sexual activity: Not Currently    Birth control/protection: Post-menopausal

## 2023-04-04 DIAGNOSIS — E782 Mixed hyperlipidemia: Secondary | ICD-10-CM | POA: Diagnosis not present

## 2023-04-04 DIAGNOSIS — E1165 Type 2 diabetes mellitus with hyperglycemia: Secondary | ICD-10-CM | POA: Diagnosis not present

## 2023-04-05 ENCOUNTER — Telehealth: Payer: Self-pay | Admitting: Orthopaedic Surgery

## 2023-04-05 ENCOUNTER — Other Ambulatory Visit: Payer: Self-pay | Admitting: Physician Assistant

## 2023-04-05 LAB — LAB REPORT - SCANNED
A1c: 10.3
EGFR: 43

## 2023-04-05 MED ORDER — TRAMADOL HCL 50 MG PO TABS: 50.0000 mg | ORAL_TABLET | Freq: Two times a day (BID) | ORAL | 0 refills | Status: AC | PRN

## 2023-04-05 NOTE — Telephone Encounter (Signed)
 Patient called and said the doctor was supposed to send in some pain medication for her knees. CB#[phone]

## 2023-04-05 NOTE — Telephone Encounter (Signed)
 Notified patient.

## 2023-04-05 NOTE — Telephone Encounter (Signed)
 Sent in tramadol

## 2023-04-07 ENCOUNTER — Encounter (INDEPENDENT_AMBULATORY_CARE_PROVIDER_SITE_OTHER): Payer: Self-pay | Admitting: Family Medicine

## 2023-04-07 ENCOUNTER — Ambulatory Visit (INDEPENDENT_AMBULATORY_CARE_PROVIDER_SITE_OTHER): Payer: 59 | Admitting: Family Medicine

## 2023-04-07 VITALS — BP 175/83 | HR 48 | Temp 97.9°F | Ht 62.0 in | Wt 208.0 lb

## 2023-04-07 DIAGNOSIS — Z794 Long term (current) use of insulin: Secondary | ICD-10-CM

## 2023-04-07 DIAGNOSIS — Z6838 Body mass index (BMI) 38.0-38.9, adult: Secondary | ICD-10-CM

## 2023-04-07 DIAGNOSIS — E1169 Type 2 diabetes mellitus with other specified complication: Secondary | ICD-10-CM | POA: Diagnosis not present

## 2023-04-07 DIAGNOSIS — E669 Obesity, unspecified: Secondary | ICD-10-CM | POA: Diagnosis not present

## 2023-04-07 DIAGNOSIS — Z7984 Long term (current) use of oral hypoglycemic drugs: Secondary | ICD-10-CM

## 2023-04-07 DIAGNOSIS — I152 Hypertension secondary to endocrine disorders: Secondary | ICD-10-CM | POA: Diagnosis not present

## 2023-04-07 DIAGNOSIS — E1159 Type 2 diabetes mellitus with other circulatory complications: Secondary | ICD-10-CM

## 2023-04-07 DIAGNOSIS — E559 Vitamin D deficiency, unspecified: Secondary | ICD-10-CM

## 2023-04-07 DIAGNOSIS — Z6836 Body mass index (BMI) 36.0-36.9, adult: Secondary | ICD-10-CM

## 2023-04-07 NOTE — Progress Notes (Signed)
Leslie Davis, D.O.  ABFM, ABOM Specializing in Clinical Bariatric Medicine  Office located at: [address] [place], [place]  [address]   Assessment and Plan:     FOR THE DISEASE OF OBESITY: Generalized obesity: Starting BMI 35.6 BMI 38.0-38.9,adult - Current 38.03 Assessment & Plan: Since last office visit on 02/07/23 patient's muscle mass has increased by 1 lb. Fat mass has increased by 6.8lb. Total body water has increased by 2.8lb.  Counseling done on how various foods will affect these numbers and how to maximize success  Total lbs lost to date: 0 lbs  Total weight loss percentage to date: 0%     Recommended Dietary Goals Leslie Davis is currently in the action stage of change. As such, her goal is to continue weight management plan.  She has agreed to: continue current plan   Behavioral Intervention We discussed the following today: increasing lean protein intake to established goals, avoiding skipping meals, increasing water intake , keeping healthy foods at home, avoiding temptations and identifying enticing environmental cues, continue to work on implementation of reduced calorie nutritional plan, and continue to work on maintaining a reduced calorie state, getting the recommended amount of protein, incorporating whole foods, making healthy choices, staying well hydrated and practicing mindfulness when eating.  Additional resources provided today: None, handout on CAT 1 meal plan , Handout on CAT 1-2 breakfast options, and Handout on CAT 1-2 lunch options  Evidence-based interventions for health behavior change were utilized today including the discussion of self monitoring techniques, problem-solving barriers and SMART goal setting techniques.   Regarding patient's less desirable eating habits and patterns, we employed the technique of small changes.   Pt will specifically work on: Get back on her meal plan for next visit.    Recommended Physical Activity  Goals Leslie Davis has been advised to work up to 150 minutes of moderate intensity aerobic activity a week and strengthening exercises 2-3 times per week for cardiovascular health, weight loss maintenance and preservation of muscle mass.   She has agreed to :  Think about enjoyable ways to increase daily physical activity and overcoming barriers to exercise, Increase physical activity in their day and reduce sedentary time (increase NEAT)., and Start strengthening exercises with a goal of 2-3 sessions a week    Pharmacotherapy We discussed various medication options to help Leslie Davis with her weight loss efforts and we both agreed to : continue with nutritional and behavioral strategies   FOR ASSOCIATED CONDITIONS ADDRESSED TODAY:  Type 2 diabetes mellitus with obesity Leslie Davis) Assessment & Plan: Lab Results  Component Value Date   HGBA1C 8.9 (H) 01/04/2023   HGBA1C 9.1 (H) 10/07/2022   HGBA1C 6.8 (H) 07/05/2022    Last A1C was elevated at 8.9 on 01/04/23. Hunger/cravings are not controlled. T2DM currently treated with Farxiga 10 mg once daily, Humalog 6 units TID, and Tresiba 24 units once daily (at night). Her non-fasting sugars range from 139-280. She is currently off weight loss injectables due to her endocrine tumor. Reports off plan eating and cravings for sweets. Has been limited when exercising due to knee pain. Pt endorses daily knee pain that has limited her ability to exercise on her stationary bike. She reports ortho has recommended she have another knee surgery. Her last microalb/creat ratio labs were 959. Her kidney function has also worsened.     Since blood sugars are suboptimally controlled and her A1c is still above goal, I recommend she discuss with her PCP about further  optimization of diabetes medications beyond insulin; DPP-4 or other meds that might be applicable. We extensively discussed the importance of decreasing simple carbs, increasing proteins and how certain foods they  eat will affect their blood sugars. Encouraged pt to eliminate unhealthy foods from her home/environment that will tempt her. We will continue to monitor her condition as it relates to her weight loss journey. Referred to [facility] program and PREP program to help her with staying active. Will recheck her A1C and review at her next OV.   Orders:  - Amb Referral To Provider Referral Exercise Program (P.R.E.P) - Recheck Hgb A1C   Hypertension associated with type 2 diabetes mellitus (HCC) Assessment & Plan: BP Readings from Last 3 Encounters:  04/07/23 (!) 175/83  02/07/23 110/62  01/05/23 135/82   BP well above goal today, at 175/83. Norvasc 10 mg once daily, Carvedilol 3.125 mg BID, and Edarbi 80 once daily.  Reviewed BP goal of 140/90 or less on a regular basis, though preferably less than 130/80. Her next follow up with PCP is scheduled in April. I recommend she follow up with her PCP in person. Optimize her chronic conditions by decreasing her salt intake, eating on plan, and exercising. Continue with current antihypertensive regimen as directed.   Orders: - Amb Referral To Provider Referral Exercise Program (P.R.E.P) - Recheck Basic metabolic panel    Vitamin D deficiency Assessment & Plan: Lab Results  Component Value Date   VD25OH 32.5 07/05/2022   VD25OH 48.3 09/14/2021   VD25OH 82.1 03/31/2021   Per Leslie Davis's med list, she is not currently on vitamin D supplementation. Her vitamin D levels were 32.5 as of 06/2023. Her vitamin D levels will likely improve as she loses weight. We will continue to monitor her condition as she continues to work on her efforts towards weight loss.    Follow up:   Return in about 25 days (around 05/02/2023). She was informed of the importance of frequent follow up visits to maximize her success with intensive lifestyle modifications for her multiple health conditions.  Subjective:   Chief complaint: Obesity Leslie Davis is here to discuss her progress  with her obesity treatment plan. She is on the Category 1 Plan with B/L options and states she is following her eating plan approximately 70% of the time. She states she is doing weight training (arms and "stomach"/core) 60 minutes 3 days per week. 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   Interval History:  Leslie Davis is here for a follow up office visit. Since last OV, she is up 7 lbs. Reports eating off plan over the holidays. States she has not been eating carbs much. Hunger and cravings (sweets) are uncontrolled. States she has not been "eating the right foods".   Pt had an ultrasound of her kidneys about 2-3 weeks ago, ordered by Dr. Allyne Gee. Results revealed a cyst on her kidneys. Per pt, she will follow up next in June.   Barriers identified: lack of time for self-care, multiple competing priorities, having difficulty focusing on healthy eating, exposure to enticing environments and/or relationships, orthopedic problems, medical conditions or chronic pain affecting mobility, and difficulty implementing reduced calorie nutrition plan. Stress, knee pain daily,   Pharmacotherapy for weight loss: She is currently taking no anti-obesity medication.   Review of Systems:  Pertinent positives were addressed with patient today.  Reviewed by clinician on day of visit: allergies, medications, problem list, medical history, surgical history, family history, social  history, and previous encounter notes.  Weight Summary and Biometrics   Weight Lost Since Last Visit: 0  Weight Gained Since Last Visit: 0    Vitals Temp: 97.9 F (36.6 C) BP: (!) 175/83 Pulse Rate: (!) 48 SpO2: 99 %   Anthropometric Measurements Height: 5\' 2"  (1.575 m) Weight: 208 lb (94.3 kg) BMI (Calculated): 38.03 Weight at Last Visit: 201 lb Weight Lost Since Last Visit: 0 Weight Gained Since Last Visit: 0 Starting Weight: 208 lb Total Weight Loss (lbs): 7 lb (3.175 kg) Peak Weight:  208 lb Waist Measurement : 49.1 inches   Body Composition  Body Fat %: 49.1 % Fat Mass (lbs): 102.6 lbs Muscle Mass (lbs): 100.8 lbs Total Body Water (lbs): 71.4 lbs Visceral Fat Rating : 16   Other Clinical Data Fasting: No Labs: No Today's Visit #: 25 Starting Date: 07/23/20    Objective:   PHYSICAL EXAM: Blood pressure (!) 175/83, pulse (!) 48, temperature 97.9 F (36.6 C), height 5\' 2"  (1.575 m), weight 208 lb (94.3 kg), SpO2 99%. Body mass index is 38.04 kg/m.  General: she is overweight, cooperative and in no acute distress. PSYCH: Has normal mood, affect and thought process.   HEENT: EOMI, sclerae are anicteric. Lungs: Normal breathing effort, no conversational dyspnea. Extremities: Moves * 4 Neurologic: A and O * 3, good insight  DIAGNOSTIC DATA REVIEWED: BMET    Component Value Date/Time   NA 139 01/04/2023 1031   K 5.4 (H) 01/04/2023 1031   CL 104 01/04/2023 1031   CO2 22 01/04/2023 1031   GLUCOSE 247 (H) 01/04/2023 1031   GLUCOSE 201 (H) 08/06/2022 0809   BUN 35 (H) 01/04/2023 1031   CREATININE 1.39 (H) 01/04/2023 1031   CREATININE 0.74 08/30/2011 1307   CALCIUM 9.9 01/04/2023 1031   GFRNONAA 57 (L) 08/06/2022 0809   GFRAA 57 (L) 02/21/2020 1500   Lab Results  Component Value Date   HGBA1C 8.9 (H) 01/04/2023   HGBA1C (H) 09/12/2007    9.4 (NOTE)   The ADA recommends the following therapeutic goal for glycemic   control related to Hgb A1C measurement:   Goal of Therapy:   < 7.0% Hgb A1C   Reference: American Diabetes Association: Clinical Practice   Recommendations 2008, Diabetes Care,  2008, 31:(Suppl 1).   No results found for: "INSULIN" Lab Results  Component Value Date   TSH 1.640 06/17/2021   CBC    Component Value Date/Time   WBC 4.8 01/04/2023 1031   WBC 6.2 08/06/2022 0809   RBC 4.74 01/04/2023 1031   RBC 3.79 (L) 08/06/2022 0809   HGB 13.3 01/04/2023 1031   HCT 41.4 01/04/2023 1031   PLT 237 01/04/2023 1031   MCV 87  01/04/2023 1031   MCH 28.1 01/04/2023 1031   MCH 28.2 08/06/2022 0809   MCHC 32.1 01/04/2023 1031   MCHC 32.0 08/06/2022 0809   RDW 13.6 01/04/2023 1031   Iron Studies    Component Value Date/Time   IRON 68 11/16/2017 0747   TIBC 355 11/16/2017 0747   FERRITIN 45.2 11/16/2017 0747   IRONPCTSAT 19 11/16/2017 0747   Lipid Panel     Component Value Date/Time   CHOL 210 (H) 01/04/2023 1031   TRIG 94 01/04/2023 1031   HDL 105 01/04/2023 1031   CHOLHDL 2.0 01/04/2023 1031   CHOLHDL 2.8 09/13/2007 0405   VLDL 17 09/13/2007 0405   LDLCALC 89 01/04/2023 1031   Hepatic Function Panel     Component Value Date/Time  PROT 7.2 01/04/2023 1031   ALBUMIN 4.1 01/04/2023 1031   AST 14 01/04/2023 1031   ALT 16 01/04/2023 1031   ALKPHOS 121 01/04/2023 1031   BILITOT 0.3 01/04/2023 1031   BILIDIR 0.1 09/06/2019 2304   BILIDIR 0.09 11/08/2018 1017   IBILI 0.4 09/06/2019 2304      Component Value Date/Time   TSH 1.640 06/17/2021 1606   Nutritional Lab Results  Component Value Date   VD25OH 32.5 07/05/2022   VD25OH 48.3 09/14/2021   VD25OH 82.1 03/31/2021    Attestations:   I, Isabelle Course, acting as a medical scribe for Thomasene Lot, DO., have compiled all relevant documentation for today's office visit on behalf of Thomasene Lot, DO, while in the presence of [employer], DO.  Reviewed by clinician on day of visit: allergies, medications, problem list, medical history, surgical history, family history, social history, and previous encounter notes pertinent to patient's obesity diagnosis.  I have spent 50 minutes in the care of the patient today including: preparing to see patient (e.g. review and interpretation of tests, old notes ), obtaining and/or reviewing separately obtained history, performing a medically appropriate examination or evaluation, counseling and educating the patient, ordering medications, test or procedures, documenting clinical information in the  electronic or other health care record, and independently interpreting results and communicating results to the patient, family, or caregiver   I have reviewed the above documentation for accuracy and completeness, and I agree with the above. Leslie Davis, D.O.  The 21st Century Cures Act was signed into law in 2016 which includes the topic of electronic health records.  This provides immediate access to information in MyChart.  This includes consultation notes, operative notes, office notes, lab results and pathology reports.  If you have any questions about what you read please let us know at your next visit so we can discuss your concerns and take corrective action if need be.  We are right here with you.

## 2023-04-08 ENCOUNTER — Telehealth: Payer: Self-pay | Admitting: *Deleted

## 2023-04-08 LAB — BASIC METABOLIC PANEL
BUN/Creatinine Ratio: 20 (ref 12–28)
BUN: 22 mg/dL (ref 8–27)
CO2: 23 mmol/L (ref 20–29)
Calcium: 9.1 mg/dL (ref 8.7–10.3)
Chloride: 105 mmol/L (ref 96–106)
Creatinine, Ser: 1.11 mg/dL — ABNORMAL HIGH (ref 0.57–1.00)
Glucose: 133 mg/dL — ABNORMAL HIGH (ref 70–99)
Potassium: 4.9 mmol/L (ref 3.5–5.2)
Sodium: 141 mmol/L (ref 134–144)
eGFR: 54 mL/min/{1.73_m2} — ABNORMAL LOW (ref >59)

## 2023-04-08 LAB — HEMOGLOBIN A1C
Est. average glucose Bld gHb Est-mCnc: 252 mg/dL
Hgb A1c MFr Bld: 10.4 % — ABNORMAL HIGH (ref 4.8–5.6)

## 2023-04-08 NOTE — Telephone Encounter (Signed)
Contacted regarding PREP Class referral. She is interested in participating at the [facility]. Class to begin 05/02/2023 every Monday/Wednesday from 1300-1415 for 12 weeks.

## 2023-04-11 ENCOUNTER — Ambulatory Visit: Payer: Self-pay

## 2023-04-12 ENCOUNTER — Other Ambulatory Visit: Payer: Self-pay | Admitting: Internal Medicine

## 2023-04-12 ENCOUNTER — Encounter: Payer: Self-pay | Admitting: Internal Medicine

## 2023-04-12 DIAGNOSIS — Z1231 Encounter for screening mammogram for malignant neoplasm of breast: Secondary | ICD-10-CM

## 2023-04-14 ENCOUNTER — Ambulatory Visit
Admission: RE | Admit: 2023-04-14 | Discharge: 2023-04-14 | Disposition: A | Discharge status: Home / Self Care | Payer: 59 | Source: Ambulatory Visit | Attending: Internal Medicine | Admitting: Internal Medicine

## 2023-04-14 DIAGNOSIS — Z1231 Encounter for screening mammogram for malignant neoplasm of breast: Secondary | ICD-10-CM | POA: Diagnosis not present

## 2023-04-15 DIAGNOSIS — K3184 Gastroparesis: Secondary | ICD-10-CM | POA: Diagnosis not present

## 2023-04-15 DIAGNOSIS — E782 Mixed hyperlipidemia: Secondary | ICD-10-CM | POA: Diagnosis not present

## 2023-04-15 DIAGNOSIS — N183 Chronic kidney disease, stage 3 unspecified: Secondary | ICD-10-CM | POA: Diagnosis not present

## 2023-04-15 DIAGNOSIS — I2510 Atherosclerotic heart disease of native coronary artery without angina pectoris: Secondary | ICD-10-CM | POA: Diagnosis not present

## 2023-04-15 DIAGNOSIS — E1165 Type 2 diabetes mellitus with hyperglycemia: Secondary | ICD-10-CM | POA: Diagnosis not present

## 2023-04-15 DIAGNOSIS — I10 Essential (primary) hypertension: Secondary | ICD-10-CM | POA: Diagnosis not present

## 2023-04-15 DIAGNOSIS — E1143 Type 2 diabetes mellitus with diabetic autonomic (poly)neuropathy: Secondary | ICD-10-CM | POA: Diagnosis not present

## 2023-04-19 ENCOUNTER — Telehealth: Payer: Self-pay | Admitting: *Deleted

## 2023-04-19 NOTE — Telephone Encounter (Signed)
Intake assessment appointment for PREP Class scheduled for 04/25/2023 at 1300 at the [facility].

## 2023-04-22 ENCOUNTER — Other Ambulatory Visit: Payer: Self-pay | Admitting: Internal Medicine

## 2023-04-25 ENCOUNTER — Encounter: Payer: Self-pay | Admitting: *Deleted

## 2023-04-25 NOTE — Progress Notes (Signed)
[facility] PREP Evaluation  Patient Details  Name: Leslie Davis MRN: [id number] Date of Birth: 09-Oct-1955 Age: 68 y.o. PCP: Dorothyann Peng, MD  Vitals:   04/25/23 1321  BP: (!) 168/56  Resp: 20  SpO2: 97%  Weight: 215 lb 12.8 oz (97.9 kg)  Height: 5\' 4"  (1.626 m)     Past Medical History:  Diagnosis Date   CKD (chronic kidney disease)    stage 3   Coronary artery disease 07/2021   obstructive CAD-70% LAD-treated medically   DM (diabetes mellitus) (HCC)    Fatty liver disease, nonalcoholic    Gastritis    Gastroparesis    GERD (gastroesophageal reflux disease)    Heartburn    Hiatal hernia    HLD (hyperlipidemia)    HTN (hypertension)    Joint pain    Renal calculus    Rheumatoid arthritis (HCC)    Vitamin D deficiency    Past Surgical History:  Procedure Laterality Date   BREAST BIOPSY  02/04/2022   MM LT RADIOACTIVE SEED EA ADD LESION LOC MAMMO GUIDE 02/04/2022 [facility] MAMMO[facility]APHY   BREAST BIOPSY  02/04/2022   MM LT RADIOACTIVE SEED LOC MAMMO GUIDE 02/04/2022 [facility] MAMMOGRAP[facility]   BREAST LUMPECTOMY WITH RADIOACTIVE SEED LOCALIZATION Left 02/05/2022   Procedure: LEFT BREAST BRACKETED LUMPECTOMY WITH RADIOACTIVE SEED LOCALIZATION;  Surgeon: Griselda Miner, MD;  Location: [facility];  Service: General;  Laterality: Left;   CHOLECYSTECTOMY     COLONOSCOPY  09/10/2011   normal   JOINT REPLACEMENT  2022   left TKA   LAPAROSCOPIC REMOVAL ABDOMINAL MASS N/A 08/05/2022   Procedure: LAPAROSCOPIC ASSISTED EXCISION OF MESENTERIC MASS;  Surgeon: Fritzi Mandes, MD;  Location: [facility] OR;  Service: General;  Laterality: N/A;   LAPAROSCOPIC SMALL BOWEL RESECTION N/A 08/05/2022   Procedure: LAPAROSCOPIC SMALL BOWEL RESECTION;  Surgeon: Fritzi Mandes, MD;  Location: [facility] OR;  Service: General;  Laterality: N/A;   LEFT HEART CATH AND CORONARY ANGIOGRAPHY N/A 08/18/2021   Procedure: LEFT HEART CATH AND CORONARY ANGIOGRAPHY;  Surgeon: Elder Negus, MD;  Location: [facility]  INVASIVE CV LAB;  Service: Cardiovascular;  Laterality: N/A;   TOTAL KNEE ARTHROPLASTY Left 10/27/2020   Procedure: LEFT TOTAL KNEE ARTHROPLASTY;  Surgeon: Tarry Kos, MD;  Location: [facility] OR;  Service: Orthopedics;  Laterality: Left;   TUBAL LIGATION     UPPER GASTROINTESTINAL ENDOSCOPY     Social History   Tobacco Use  Smoking Status Former   Current packs/day: 0.00   Average packs/day: 0.3 packs/day for 1 year (0.3 ttl pk-yrs)   Types: Cigarettes   Start date: 09/09/1973   Quit date: 09/10/1974   Years since quitting: 48.6  Smokeless Tobacco Never  Tobacco Comments   she no longer smokes.     [name] 04/25/2023, 1:27 PM   PREP Class beginning 05/02/2023 every Monday/ Wednesday from 1300-1415 for 12 weeks. No barriers to participation noted.

## 2023-05-02 ENCOUNTER — Encounter: Payer: Self-pay | Admitting: *Deleted

## 2023-05-02 ENCOUNTER — Ambulatory Visit (INDEPENDENT_AMBULATORY_CARE_PROVIDER_SITE_OTHER): Payer: 59 | Admitting: Family Medicine

## 2023-05-02 ENCOUNTER — Encounter (INDEPENDENT_AMBULATORY_CARE_PROVIDER_SITE_OTHER): Payer: Self-pay | Admitting: Family Medicine

## 2023-05-02 VITALS — BP 183/76 | HR 55 | Temp 97.8°F | Ht 64.0 in | Wt 211.0 lb

## 2023-05-02 DIAGNOSIS — Z7984 Long term (current) use of oral hypoglycemic drugs: Secondary | ICD-10-CM | POA: Diagnosis not present

## 2023-05-02 DIAGNOSIS — I152 Hypertension secondary to endocrine disorders: Secondary | ICD-10-CM

## 2023-05-02 DIAGNOSIS — Z794 Long term (current) use of insulin: Secondary | ICD-10-CM

## 2023-05-02 DIAGNOSIS — Z6838 Body mass index (BMI) 38.0-38.9, adult: Secondary | ICD-10-CM

## 2023-05-02 DIAGNOSIS — E1159 Type 2 diabetes mellitus with other circulatory complications: Secondary | ICD-10-CM | POA: Diagnosis not present

## 2023-05-02 DIAGNOSIS — N1831 Chronic kidney disease, stage 3a: Secondary | ICD-10-CM

## 2023-05-02 DIAGNOSIS — Z6836 Body mass index (BMI) 36.0-36.9, adult: Secondary | ICD-10-CM

## 2023-05-02 DIAGNOSIS — E1165 Type 2 diabetes mellitus with hyperglycemia: Secondary | ICD-10-CM

## 2023-05-02 DIAGNOSIS — E669 Obesity, unspecified: Secondary | ICD-10-CM

## 2023-05-02 NOTE — Progress Notes (Signed)
 [facility] PREP Weekly Session  Patient Details  Name: Leslie Davis MRN: [id number] Date of Birth: [birth year] Age: 68 y.o. PCP: Cleave Curling, MD  Vitals:   05/02/23 1300  Weight: 218 lb (98.9 kg)     [facility] Weekly seesion - 05/02/23 1500       [facility] "PREP" Location   [facility] "PREP" Location [facility]      Weekly Session   Topic Discussed Goal setting and welcome to the program   Introductions, text book review, perceived exertion scale, tour facility, homework: no sitting longer than 30 minutes.   Classes attended to date 1             Howard Macho 05/02/2023, 9:03 PM

## 2023-05-02 NOTE — Progress Notes (Signed)
Leslie Davis, D.O.  ABFM, ABOM Specializing in Clinical Bariatric Medicine  Office located at: [address] [place], [place]  [address]   Assessment and Plan:   FOR THE DISEASE OF OBESITY: BMI 38.0-38.9,adult - Current 36.2 Generalized obesity: Starting BMI 35.6 Assessment & Plan: Since last office visit on 04/07/2023 patient's  Muscle mass has increased by 2.8 lb. Fat mass has decreased by 0.2 lb. Total body water has increased by 3.2 lb.  Counseling done on how various foods will affect these numbers and how to maximize success  Total lbs lost to date: + 3 lbs  Total weight loss percentage to date: +1.44%    Recommended Dietary Goals Leslie Davis is currently in the action stage of change. As such, her goal is to continue weight management plan.  She has agreed to: continue current plan   Behavioral Intervention We discussed the following today: focusing more on her health and well being, protein shake options for breakfast, increasing lean protein intake to established goals, decreasing simple carbohydrates , and increasing vegetables  Additional resources provided today:  handout on affordable food scale, handout on non-starchy vegetables  Evidence-based interventions for health behavior change were utilized today including the discussion of self monitoring techniques, problem-solving barriers and SMART goal setting techniques.   Regarding patient's less desirable eating habits and patterns, we employed the technique of small changes.   Pt will specifically work on: measuring veggies and lean protein intake.    Recommended Physical Activity Goals Leslie Davis has been advised to work up to 150 minutes of moderate intensity aerobic activity a week and strengthening exercises 2-3 times per week for cardiovascular health, weight loss maintenance and preservation of muscle mass.   Patient will begin to exercise twice weekly at the [facility] provider referral exercise program.  Eventual goal is 1 hr of exercise daily.    Pharmacotherapy We both agreed to : continue with nutritional and behavioral strategies   FOR ASSOCIATED CONDITIONS ADDRESSED TODAY:  Hypertension associated with type 2 diabetes mellitus (HCC) Assessment & Plan: Last 3 blood pressure readings in our office are as follows: BP Readings from Last 3 Encounters:  05/02/23 (!) 183/76  04/25/23 (!) 168/56  04/07/23 (!) 175/83   Current medications: Norvasc, Carvedilol, and Edarbi. BP above target - she attributes this to eating pork spare ribs last night. Pt completely asymptomatic. Her BP at home: 140s-150s/67   Discussed that goal BP for diabetic is 140/90 or less. Continue monitoring BP at home and maintain with all medications. Reviewed lean sources of protein. Continue with diet low in sodium and high in lean protein, start exercise program. She has appointment with PCP on 4/16 to discuss further care - we recommend pt try to get in sooner.    Stage 3a chronic kidney disease (HCC) Assessment & Plan: Reviewed labs from 04/07/2023. Her Serum Creatinine has improved from 1.39 to 1.11. eGFR of 54. Discussed the importance of proper hydration, exercise, controlling blood sugars/blood pressure, and avoiding NSAIDs to improve kidney function. She has an appointment with PCP on 4/16 to discuss further care - we recommend pt try to get in sooner.   Poorly controlled diabetes mellitus (HCC)- on insulin SS and long acting Assessment & Plan: Condition managed by Dr.Balan of endocrinology. T2DM currently treated with Farxiga 10 mg once daily, Humalog 6 units at breakfast, 8 units at lunch, and 10 units at dinner & Tresiba 24 units once daily (at night). She is currently off Ozempic  due  to her endocrine tumor. Her Hemoglobin A1c is poorly controlled at 10.4. Reviewed pt's Dexcom. Average glucose in the last 30 days: 188. Average glucose in last 7 days: 200. Her blood sugars tend to spike the highest after  dinner around midnight and then it slowly comes back down.   Pt educated on the detriments of having a Hgb A1c >7.  We extensively discussed the importance of decreasing simple carbs, increasing proteins and how certain foods they eat will affect their blood sugars. Educated patient that having adequate amounts of protein with each meal is important for increasing muscle mass, stabilizing sugars, controlling hunger and cravings, and improving thermogenesis. Start exercise program and maintain with all medications at current doses.    Follow up:   Return 07/25/2023. She was informed of the importance of frequent follow up visits to maximize her success with intensive lifestyle modifications for her multiple health conditions.  Subjective:   Chief complaint: Obesity Leslie Davis is here to discuss her progress with her obesity treatment plan. She is on the Category 1 Plan with B/L options and states she is following her eating plan approximately 70% of the time. She states she is not exercising.  Interval History:  Leslie Davis is here for a follow up office visit. Since last OV on 04/07/2023, Leslie Davis is up 3 lbs. She endorses not eating enough protein. She snacks on cheese crackers, peanut butter crackers, and regular popcorn. She acknowledges having hunger/cravings. She will be starting the provider referral exercise program at the [facility] twice weekly.   Pharmacotherapy for weight loss: She is currently off weight loss injectables due to her endocrine tumor   Review of Systems:  Pertinent positives were addressed with patient today.  Reviewed by clinician on day of visit: allergies, medications, problem list, medical history, surgical history, family history, social history, and previous encounter notes.  Weight Summary and Biometrics   Weight Lost Since Last Visit: 0 lb  Weight Gained Since Last Visit: 3 lb   Vitals Temp: 97.8 F (36.6 C) BP: (!) 183/76 Pulse Rate: (!) 55 SpO2: 99  %   Anthropometric Measurements Height: 5\' 4"  (1.626 m) Weight: 211 lb (95.7 kg) BMI (Calculated): 36.2 Weight at Last Visit: 20 8lb Weight Lost Since Last Visit: 0 lb Weight Gained Since Last Visit: 3 lb Starting Weight: 208 lb Total Weight Loss (lbs): 3 lb (1.361 kg) Peak Weight: 208 lb   Body Composition  Body Fat %: 48.4 % Fat Mass (lbs): 102.4 lbs Muscle Mass (lbs): 103.6 lbs Total Body Water (lbs): 74.6 lbs Visceral Fat Rating : 15   Other Clinical Data Fasting: No Labs: No Today's Visit #: 26 Starting Date: 07/23/20   Objective:   PHYSICAL EXAM: Blood pressure (!) 183/76, pulse (!) 55, temperature 97.8 F (36.6 C), height 5\' 4"  (1.626 m), weight 211 lb (95.7 kg), SpO2 99%. Body mass index is 36.22 kg/m.  General: she is overweight, cooperative and in no acute distress. PSYCH: Has normal mood, affect and thought process.   HEENT: EOMI, sclerae are anicteric. Lungs: Normal breathing effort, no conversational dyspnea. Extremities: Moves * 4 Neurologic: A and O * 3, good insight  DIAGNOSTIC DATA REVIEWED: BMET    Component Value Date/Time   NA 141 04/07/2023 1136   K 4.9 04/07/2023 1136   CL 105 04/07/2023 1136   CO2 23 04/07/2023 1136   GLUCOSE 133 (H) 04/07/2023 1136   GLUCOSE 201 (H) 08/06/2022 0809   BUN 22 04/07/2023 1136   CREATININE  1.11 (H) 04/07/2023 1136   CREATININE 0.74 08/30/2011 1307   CALCIUM 9.1 04/07/2023 1136   GFRNONAA 57 (L) 08/06/2022 0809   GFRAA 57 (L) 02/21/2020 1500   Lab Results  Component Value Date   HGBA1C 10.4 (H) 04/07/2023   HGBA1C (H) 09/12/2007    9.4 (NOTE)   The ADA recommends the following therapeutic goal for glycemic   control related to Hgb A1C measurement:   Goal of Therapy:   < 7.0% Hgb A1C   Reference: American Diabetes Association: Clinical Practice   Recommendations 2008, Diabetes Care,  2008, 31:(Suppl 1).   No results found for: "INSULIN" Lab Results  Component Value Date   TSH 1.640 06/17/2021    CBC    Component Value Date/Time   WBC 4.8 01/04/2023 1031   WBC 6.2 08/06/2022 0809   RBC 4.74 01/04/2023 1031   RBC 3.79 (L) 08/06/2022 0809   HGB 13.3 01/04/2023 1031   HCT 41.4 01/04/2023 1031   PLT 237 01/04/2023 1031   MCV 87 01/04/2023 1031   MCH 28.1 01/04/2023 1031   MCH 28.2 08/06/2022 0809   MCHC 32.1 01/04/2023 1031   MCHC 32.0 08/06/2022 0809   RDW 13.6 01/04/2023 1031   Iron Studies    Component Value Date/Time   IRON 68 11/16/2017 0747   TIBC 355 11/16/2017 0747   FERRITIN 45.2 11/16/2017 0747   IRONPCTSAT 19 11/16/2017 0747   Lipid Panel     Component Value Date/Time   CHOL 210 (H) 01/04/2023 1031   TRIG 94 01/04/2023 1031   HDL 105 01/04/2023 1031   CHOLHDL 2.0 01/04/2023 1031   CHOLHDL 2.8 09/13/2007 0405   VLDL 17 09/13/2007 0405   LDLCALC 89 01/04/2023 1031   Hepatic Function Panel     Component Value Date/Time   PROT 7.2 01/04/2023 1031   ALBUMIN 4.1 01/04/2023 1031   AST 14 01/04/2023 1031   ALT 16 01/04/2023 1031   ALKPHOS 121 01/04/2023 1031   BILITOT 0.3 01/04/2023 1031   BILIDIR 0.1 09/06/2019 2304   BILIDIR 0.09 11/08/2018 1017   IBILI 0.4 09/06/2019 2304      Component Value Date/Time   TSH 1.640 06/17/2021 1606   Nutritional Lab Results  Component Value Date   VD25OH 32.5 07/05/2022   VD25OH 48.3 09/14/2021   VD25OH 82.1 03/31/2021    Attestations:   I, Special Puri, acting as a Stage manager for [employer], DO., have compiled all relevant documentation for today's office visit on behalf of Thomasene Lot, DO, while in the presence of [employer], DO.  Reviewed by clinician on day of visit: allergies, medications, problem list, medical history, surgical history, family history, social history, and previous encounter notes pertinent to patient's obesity diagnosis. I have spent 50 minutes in the care of the patient today including: preparing to see patient (e.g. review and interpretation of tests, old notes  ), obtaining and/or reviewing separately obtained history, performing a medically appropriate examination or evaluation, counseling and educating the patient, ordering medications, test or procedures, documenting clinical information in the electronic or other health care record, and independently interpreting results and communicating results to the patient, family, or caregiver   I have reviewed the above documentation for accuracy and completeness, and I agree with the above. Leslie Davis, D.O.  The 21st Century Cures Act was signed into law in 2016 which includes the topic of electronic health records.  This provides immediate access to information in MyChart.  This includes consultation  notes, operative notes, office notes, lab results and pathology reports.  If you have any questions about what you read please let us know at your next visit so we can discuss your concerns and take corrective action if need be.  We are right here with you.

## 2023-05-08 ENCOUNTER — Emergency Department (HOSPITAL_COMMUNITY)
Admission: EM | Admit: 2023-05-08 | Discharge: 2023-05-09 | Disposition: A | Discharge status: Home / Self Care | Payer: 59 | Attending: Emergency Medicine | Admitting: Emergency Medicine

## 2023-05-08 ENCOUNTER — Other Ambulatory Visit: Payer: Self-pay

## 2023-05-08 DIAGNOSIS — I10 Essential (primary) hypertension: Secondary | ICD-10-CM | POA: Diagnosis not present

## 2023-05-08 DIAGNOSIS — E1165 Type 2 diabetes mellitus with hyperglycemia: Secondary | ICD-10-CM | POA: Insufficient documentation

## 2023-05-08 DIAGNOSIS — I129 Hypertensive chronic kidney disease with stage 1 through stage 4 chronic kidney disease, or unspecified chronic kidney disease: Secondary | ICD-10-CM | POA: Diagnosis not present

## 2023-05-08 DIAGNOSIS — R6889 Other general symptoms and signs: Secondary | ICD-10-CM | POA: Diagnosis not present

## 2023-05-08 DIAGNOSIS — R109 Unspecified abdominal pain: Secondary | ICD-10-CM

## 2023-05-08 DIAGNOSIS — R739 Hyperglycemia, unspecified: Secondary | ICD-10-CM

## 2023-05-08 DIAGNOSIS — E1122 Type 2 diabetes mellitus with diabetic chronic kidney disease: Secondary | ICD-10-CM | POA: Diagnosis not present

## 2023-05-08 DIAGNOSIS — R1012 Left upper quadrant pain: Secondary | ICD-10-CM | POA: Insufficient documentation

## 2023-05-08 DIAGNOSIS — N1831 Chronic kidney disease, stage 3a: Secondary | ICD-10-CM | POA: Diagnosis not present

## 2023-05-08 DIAGNOSIS — Z794 Long term (current) use of insulin: Secondary | ICD-10-CM | POA: Diagnosis not present

## 2023-05-08 DIAGNOSIS — I499 Cardiac arrhythmia, unspecified: Secondary | ICD-10-CM | POA: Diagnosis not present

## 2023-05-08 DIAGNOSIS — N3289 Other specified disorders of bladder: Secondary | ICD-10-CM | POA: Diagnosis not present

## 2023-05-08 DIAGNOSIS — Z9049 Acquired absence of other specified parts of digestive tract: Secondary | ICD-10-CM | POA: Diagnosis not present

## 2023-05-08 DIAGNOSIS — R1032 Left lower quadrant pain: Secondary | ICD-10-CM | POA: Diagnosis not present

## 2023-05-08 LAB — CBC
HCT: 38.1 % (ref 36.0–46.0)
Hemoglobin: 12.1 g/dL (ref 12.0–15.0)
MCH: 28.4 pg (ref 26.0–34.0)
MCHC: 31.8 g/dL (ref 30.0–36.0)
MCV: 89.4 fL (ref 80.0–100.0)
Platelets: 193 10*3/uL (ref 150–400)
RBC: 4.26 MIL/uL (ref 3.87–5.11)
RDW: 15 % (ref 11.5–15.5)
WBC: 4.7 10*3/uL (ref 4.0–10.5)
nRBC: 0 % (ref 0.0–0.2)

## 2023-05-08 LAB — COMPREHENSIVE METABOLIC PANEL
ALT: 15 U/L (ref 0–44)
AST: 16 U/L (ref 15–41)
Albumin: 3.5 g/dL (ref 3.5–5.0)
Alkaline Phosphatase: 75 U/L (ref 38–126)
Anion gap: 6 (ref 5–15)
BUN: 25 mg/dL — ABNORMAL HIGH (ref 8–23)
CO2: 20 mmol/L — ABNORMAL LOW (ref 22–32)
Calcium: 8.2 mg/dL — ABNORMAL LOW (ref 8.9–10.3)
Chloride: 108 mmol/L (ref 98–111)
Creatinine, Ser: 1.06 mg/dL — ABNORMAL HIGH (ref 0.44–1.00)
GFR, Estimated: 57 mL/min — ABNORMAL LOW (ref >60)
Glucose, Bld: 235 mg/dL — ABNORMAL HIGH (ref 70–99)
Potassium: 4 mmol/L (ref 3.5–5.1)
Sodium: 134 mmol/L — ABNORMAL LOW (ref 135–145)
Total Bilirubin: 0.5 mg/dL (ref 0.0–1.2)
Total Protein: 6.8 g/dL (ref 6.5–8.1)

## 2023-05-08 LAB — LIPASE, BLOOD: Lipase: 44 U/L (ref 11–51)

## 2023-05-08 NOTE — ED Triage Notes (Signed)
Patient BIB EMS from home c/o abdominal pain x 1 day. Patient worsening LLQ abdominal pain tonight. Patient denies N/V/D. Patient denies Chest Pain. Patient denies SOB.

## 2023-05-09 ENCOUNTER — Encounter: Payer: Self-pay | Admitting: *Deleted

## 2023-05-09 ENCOUNTER — Emergency Department (HOSPITAL_COMMUNITY): Payer: 59

## 2023-05-09 DIAGNOSIS — Z9049 Acquired absence of other specified parts of digestive tract: Secondary | ICD-10-CM | POA: Diagnosis not present

## 2023-05-09 DIAGNOSIS — N3289 Other specified disorders of bladder: Secondary | ICD-10-CM | POA: Diagnosis not present

## 2023-05-09 DIAGNOSIS — R109 Unspecified abdominal pain: Secondary | ICD-10-CM | POA: Diagnosis not present

## 2023-05-09 LAB — URINALYSIS, ROUTINE W REFLEX MICROSCOPIC
Bacteria, UA: NONE SEEN
Bilirubin Urine: NEGATIVE
Glucose, UA: >=500 mg/dL — AB
Hgb urine dipstick: NEGATIVE
Ketones, ur: NEGATIVE mg/dL
Nitrite: NEGATIVE
Protein, ur: 100 mg/dL — AB
Specific Gravity, Urine: 1.011 (ref 1.005–1.030)
pH: 6 (ref 5.0–8.0)

## 2023-05-09 MED ORDER — HYOSCYAMINE SULFATE 0.125 MG SL SUBL
0.2500 mg | SUBLINGUAL_TABLET | Freq: Once | SUBLINGUAL | Status: AC
  Administered 2023-05-09: 0.25 mg via SUBLINGUAL
  Filled 2023-05-09: qty 2

## 2023-05-09 MED ORDER — ALUM & MAG HYDROXIDE-SIMETH 200-200-20 MG/5ML PO SUSP
30.0000 mL | Freq: Once | ORAL | Status: AC
  Administered 2023-05-09: 30 mL via ORAL
  Filled 2023-05-09: qty 30

## 2023-05-09 NOTE — ED Provider Notes (Signed)
[place] EMERGENCY DEPARTMENT AT [facility] Provider Note  CSN: [id number] Arrival date & time: 05/08/23 2112  Chief Complaint(s) Abdominal Pain  HPI Leslie Davis is a 68 y.o. female     Abdominal Pain Pain location:  LUQ and LLQ Pain quality: aching and cramping   Pain radiates to:  Does not radiate Pain severity:  Moderate Onset quality:  Sudden Duration:  7 hours Timing:  Constant Progression:  Waxing and waning Chronicity:  New Relieved by:  Nothing Worsened by:  Movement Associated symptoms: no chest pain, no diarrhea, no dysuria, no fatigue, no fever, no nausea and no vomiting     Past Medical History Past Medical History:  Diagnosis Date   CKD (chronic kidney disease)    stage 3   Coronary artery disease 07/2021   obstructive CAD-70% LAD-treated medically   DM (diabetes mellitus) (HCC)    Fatty liver disease, nonalcoholic    Gastritis    Gastroparesis    GERD (gastroesophageal reflux disease)    Heartburn    Hiatal hernia    HLD (hyperlipidemia)    HTN (hypertension)    Joint pain    Renal calculus    Rheumatoid arthritis (HCC)    Vitamin D deficiency    Patient Active Problem List   Diagnosis Date Noted   Stage 3a chronic kidney disease (HCC) 01/19/2023   Left thigh pain 08/19/2022   Mesenteric mass 08/05/2022   Small bowel mass 08/05/2022   Acute cough 05/02/2022   Nasal congestion 05/02/2022   Type 2 diabetes mellitus with obesity (HCC) 03/24/2022   Generalized obesity 03/24/2022   Atherosclerosis of aorta (HCC) 03/23/2022   Coronary artery disease of native artery of native heart with stable angina pectoris (HCC)    Other constipation 05/25/2021   Class 2 severe obesity due to excess calories with serious comorbidity and body mass index (BMI) of 36.0 to 36.9 in adult (HCC) 12/01/2020   Status post total left knee replacement 10/27/2020   Primary osteoarthritis of left knee    NAFLD (nonalcoholic fatty liver disease)  08/06/2020   Other fatigue 07/23/2020   SOBOE (shortness of breath on exertion) 07/23/2020   Diabetes mellitus (HCC) 07/23/2020   Hypertension associated with type 2 diabetes mellitus (HCC) 07/23/2020   Hyperlipidemia associated with type 2 diabetes mellitus (HCC) 07/23/2020   Absolute anemia 07/23/2020   Vitamin D deficiency 07/23/2020   At risk for heart disease 07/23/2020   AKI (acute kidney injury) (HCC) 09/07/2019   Acute kidney injury (HCC) 09/06/2019   Bradycardia 09/06/2019   Hyperglycemia 09/06/2019   HLD (hyperlipidemia)    Hypertensive heart and renal disease 05/16/2019   Erosive gastritis 11/16/2017   Fatty liver 11/16/2017   Abdominal pain 12/11/2012   Early satiety 12/11/2012   Hiatal hernia    Hx of cholecystectomy 02/05/2011   Flu-like symptoms 02/05/2011   CONSTIPATION 08/27/2008   FATTY LIVER DISEASE 05/07/2008   Nausea with vomiting 04/23/2008   EPIGASTRIC PAIN 04/23/2008   RENAL CALCULUS 04/22/2008   Type 2 diabetes mellitus with stage 2 chronic kidney disease, with long-term current use of insulin (HCC) 05/19/2006   Type 2 diabetes mellitus with hyperlipidemia (HCC) 05/19/2006   HYPERTENSION, BENIGN SYSTEMIC 05/19/2006   GASTROESOPHAGEAL REFLUX, NO ESOPHAGITIS 05/19/2006   AMENORRHEA 05/19/2006   Home Medication(s) Prior to Admission medications   Medication Sig Start Date End Date Taking? Authorizing Provider  acetaminophen (TYLENOL) 500 MG tablet Take 1 tablet (500 mg total) by mouth every 6 (six)  hours as needed for mild pain. 08/08/22   Fritzi Mandes, MD  ALLERGY RELIEF CETIRIZINE 10 MG tablet TAKE 1 TABLET BY MOUTH ONCE DAILY 12/22/22   Dorothyann Peng, MD  amLODipine (NORVASC) 10 MG tablet TAKE ONE TABLET BY MOUTH ONCE DAILY 10/06/22   Arnette Felts, FNP  ASPIRIN LOW DOSE 81 MG tablet TAKE 1 TABLET BY MOUTH ONCE DAILY *SWALLOW WHOLE* 04/25/23   Dorothyann Peng, MD  Blood Glucose Monitoring Suppl (ONETOUCH VERIO) w/Device KIT Use as directed to check blood  sugars 2 times per day dx: e11.65 02/13/19   Dorothyann Peng, MD  carvedilol (COREG) 3.125 MG tablet TAKE ONE (1) TABLET BY MOUTH TWICE DAILY WITH MEALS 03/24/23   Tolia, Sunit, DO  celecoxib (CELEBREX) 200 MG capsule Take 1 capsule (200 mg total) by mouth 2 (two) times daily as needed. 09/29/21   Cristie Hem, PA-C  clindamycin (CLEOCIN T) 1 % lotion Apply 1 Application topically daily as needed (acne). 07/21/22   [provider]  Continuous Glucose Receiver (FREESTYLE LIBRE 3 READER) DEVI Use to check glucose continuously 02/23/23   Dorothyann Peng, MD  Continuous Glucose Sensor (FREESTYLE LIBRE 3 PLUS SENSOR) MISC Change sensor every 15 days. 03/21/23   Dorothyann Peng, MD  EDARBI 80 MG TABS TAKE ONE TABLET BY MOUTH ONCE DAILY 10/06/22   Dorothyann Peng, MD  ezetimibe (ZETIA) 10 MG tablet TAKE 1 TABLET BY MOUTH ONCE DAILY 02/25/23   Tolia, Sunit, DO  FARXIGA 10 MG TABS tablet TAKE ONE TABLET BY MOUTH BEFORE BREAKFAST daily 10/06/22   Dorothyann Peng, MD  ferrous gluconate (FERGON) 324 MG tablet TAKE 1 TABLET BY MOUTH TWICE DAILY WITH A MEAL. 05/28/19   Mansouraty, Netty Starring., MD  glucose blood (ONETOUCH VERIO) test strip USE TO TEST BLOOD SUGAR TWICE DAILY AS DIRECTED 12/28/22   Dorothyann Peng, MD  insulin lispro (HUMALOG) 100 UNIT/ML KwikPen Inject 0-6 Units into the skin 3 (three) times daily with meals. Inject up to 3 times daily with meals per sliding scale (70-150: 2 units, 150-199: 3 units, 200-249: 4 units, 250-299: 5 units, 300-349: 6 units) 03/21/23   Dorothyann Peng, MD  Insulin Pen Needle (PEN NEEDLES) 31G X 5 MM MISC 1 each by Does not apply route 3 (three) times daily. May dispense any manufacturer covered by patient's insurance. 08/09/22   Noralee Stain, DO  linaclotide [facility]) 145 MCG CAPS capsule Take 1 capsule (145 mcg total) by mouth daily. 08/02/22   Whitmire, [name], FNP  nitroGLYCERIN (NITROSTAT) 0.4 MG SL tablet Place 1 tablet (0.4 mg total) under the tongue every 5 (five) minutes  as needed for chest pain. If you require more than two tablets five minutes apart go to the nearest ER via EMS. Patient not taking: Reported on 02/23/2023 08/03/21 08/26/22  Tolia, Sunit, DO  ondansetron (ZOFRAN) 4 MG tablet TAKE 1 TABLET BY MOUTH EVERY 8 HOURS AS NEEDED FOR NAUSEA & VOMITING 01/21/23   Dorothyann Peng, MD  OneTouch Delica Lancets 33G MISC Use as directed to check blood sugars 2 times per day dx:e11.65 02/13/19   Dorothyann Peng, MD  pantoprazole (PROTONIX) 40 MG tablet TAKE ONE (1) TABLET BY MOUTH TWICE DAILY 01/21/23   Dorothyann Peng, MD  polyethylene glycol (MIRALAX / GLYCOLAX) 17 g packet Take 17 g by mouth daily as needed for mild constipation. 08/08/22   Fritzi Mandes, MD  rosuvastatin (CRESTOR) 20 MG tablet TAKE ONE TABLET BY MOUTH ONCE DAILY 10/06/22   Dorothyann Peng, MD  temazepam (RESTORIL)  30 MG capsule TAKE 1 CAPSULE BY MOUTH AT BEDTIME AS NEEDED *REFILL REQUEST* 11/19/22   Dorothyann Peng, MD  traMADol (ULTRAM) 50 MG tablet Take 1-2 tablets (50-100 mg total) by mouth daily as needed. 12/06/22   Tarry Kos, MD  traMADol (ULTRAM) 50 MG tablet Take 1 tablet (50 mg total) by mouth 2 (two) times daily as needed. 04/05/23   Cristie Hem, PA-C  TRESIBA FLEXTOUCH 100 UNIT/ML FlexTouch Pen Inject 24 units daily. Max daily dose 30 units. 03/21/23   Dorothyann Peng, MD  TRUEPLUS PEN NEEDLES 31G X 6 MM MISC USE WITH pen TO INJECT insulin DAILY 12/01/20   Arnette Felts, FNP                                                                                                                                    Allergies Fiasp [insulin aspart (w-niacinamide)]  Review of Systems Review of Systems  Constitutional:  Negative for fatigue and fever.  Cardiovascular:  Negative for chest pain.  Gastrointestinal:  Positive for abdominal pain. Negative for diarrhea, nausea and vomiting.  Genitourinary:  Negative for dysuria.   As noted in HPI  Physical Exam Vital Signs  I have reviewed the triage  vital signs BP (!) 189/71 (BP Location: Right Arm)   Pulse (!) 55   Temp 97.8 F (36.6 C) (Oral)   Resp 16   Ht 5\' 4"  (1.626 m)   Wt 97.5 kg   SpO2 96%   BMI 36.90 kg/m   Physical Exam Vitals reviewed.  Constitutional:      General: She is not in acute distress.    Appearance: She is well-developed. She is obese. She is not diaphoretic.  HENT:     Head: Normocephalic and atraumatic.     Right Ear: External ear normal.     Left Ear: External ear normal.     Nose: Nose normal.  Eyes:     General: No scleral icterus.    Conjunctiva/sclera: Conjunctivae normal.  Neck:     Trachea: Phonation normal.  Cardiovascular:     Rate and Rhythm: Normal rate and regular rhythm.  Pulmonary:     Effort: Pulmonary effort is normal. No respiratory distress.     Breath sounds: No stridor.  Abdominal:     General: There is no distension.  Musculoskeletal:        General: Normal range of motion.     Cervical back: Normal range of motion.  Neurological:     Mental Status: She is alert and oriented to person, place, and time.  Psychiatric:        Behavior: Behavior normal.     ED Results and Treatments Labs (all labs ordered are listed, but only abnormal results are displayed) Labs Reviewed  COMPREHENSIVE METABOLIC PANEL - Abnormal; Notable for the following components:      Result Value   Sodium 134 (*)  CO2 20 (*)    Glucose, Bld 235 (*)    BUN 25 (*)    Creatinine, Ser 1.06 (*)    Calcium 8.2 (*)    GFR, Estimated 57 (*)    All other components within normal limits  URINALYSIS, ROUTINE W REFLEX MICROSCOPIC - Abnormal; Notable for the following components:   Color, Urine STRAW (*)    Glucose, UA >=500 (*)    Protein, ur 100 (*)    Leukocytes,Ua SMALL (*)    All other components within normal limits  LIPASE, BLOOD  CBC                                                                                                                         EKG  EKG  Interpretation Date/Time:    Ventricular Rate:    PR Interval:    QRS Duration:    QT Interval:    QTC Calculation:   R Axis:      Text Interpretation:         Radiology CT Renal Stone Study Result Date: 05/09/2023 CLINICAL DATA:  Abdominal pain on the left for 1 day EXAM: CT ABDOMEN AND PELVIS WITHOUT CONTRAST TECHNIQUE: Multidetector CT imaging of the abdomen and pelvis was performed following the standard protocol without IV contrast. RADIATION DOSE REDUCTION: This exam was performed according to the departmental dose-optimization program which includes automated exposure control, adjustment of the mA and/or kV according to patient size and/or use of iterative reconstruction technique. COMPARISON:  11/11/2022 FINDINGS: Lower chest: No acute abnormality. Hepatobiliary: No focal liver abnormality is seen. Status post cholecystectomy. No biliary dilatation. Pancreas: Unremarkable. No pancreatic ductal dilatation or surrounding inflammatory changes. Spleen: Normal in size without focal abnormality. Adrenals/Urinary Tract: Adrenal glands are within normal limits. Kidneys are well visualize without renal calculi or obstructive change. Bladder is well distended. Stomach/Bowel: No obstructive or inflammatory changes of the colon are noted. The appendix is within normal limits. Small bowel is unremarkable. Stomach is within normal limits. Vascular/Lymphatic: Aortic calcifications are noted. No lymphadenopathy is noted. Postsurgical changes are noted consistent with prior mesenteric resection. Reproductive: Uterus is somewhat bulky likely related underlying uterine fibroid change. No adnexal mass is seen. Other: No abdominal wall hernia or abnormality. No abdominopelvic ascites. Musculoskeletal: No acute or significant osseous findings. IMPRESSION: Uterine fibroid change. No acute abnormality is noted. No recurrent mesenteric mass is seen. Electronically Signed   By: Alcide Clever M.D.   On: 05/09/2023 03:04     Medications Ordered in ED Medications  alum & mag hydroxide-simeth (MAALOX/MYLANTA) 200-200-20 MG/5ML suspension 30 mL (30 mLs Oral Given 05/09/23 0204)  hyoscyamine (LEVSIN SL) SL tablet 0.25 mg (0.25 mg Sublingual Given by Other 05/09/23 0204)   Procedures Procedures  (including critical care time) Medical Decision Making / ED Course   Medical Decision Making Amount and/or Complexity of Data Reviewed Labs: ordered. Radiology: ordered.  Risk OTC drugs. Prescription drug management.    Abdominal pain  Differential diagnosis  of workup considered  CBC without leukocytosis or anemia.  Metabolic panel without significant electrolyte derangements.  Hyperglycemia without DKA.  Mild renal sufficiency without AKI.  No evidence of bili obstruction or pancreatitis.  UA without evidence of infection.  CT scan negative for renal stones.  No new masses.  No evidence of bowel obstruction.  No intra-abdominal inflammatory/infectious process.  Worsening uterine fibroids noted.  Patient informed and recommended she follow-up with gynecology.  Symptoms improved with GI cocktail and Levsin.  Likely gaseous/functional intestinal discomfort.  Patient tolerated p.o.    Final Clinical Impression(s) / ED Diagnoses Final diagnoses:  Abdominal discomfort  Hyperglycemia   The patient appears reasonably screened and/or stabilized for discharge and I doubt any other medical condition or other [facility] requiring further screening, evaluation, or treatment in the ED at this time. I have discussed the findings, Dx and Tx plan with the patient/family who expressed understanding and agree(s) with the plan. Discharge instructions discussed at length. The patient/family was given strict return precautions who verbalized understanding of the instructions. No further questions at time of discharge.  Disposition: Discharge  Condition: Good  ED Discharge Orders     None         Follow Up: Dorothyann Peng,  MD [address] [place] [address] [phone]  Call  to schedule an appointment for close follow up     This chart was dictated using voice recognition software.  Despite best efforts to proofread,  errors can occur which can change the documentation meaning.    Nira Conn, MD 05/09/23 [phone]

## 2023-05-09 NOTE — Progress Notes (Signed)
[facility] PREP Weekly Session  Patient Details  Name: Leslie Davis MRN: [id number] Date of Birth: 12/23/1955 Age: 68 y.o. PCP: Dorothyann Peng, MD  Vitals:   05/09/23 1300  Weight: 215 lb (97.5 kg)     [facility] Weekly seesion - 05/09/23 1500       [facility] "PREP" Location   [facility] "PREP" Location [facility]      Weekly Session   Topic Discussed Other ways to be active;Importance of resistance training   Discussed components of a balanced fitness plan, benefits of cardio and strength training, national standards 150 mins/week of cardio, strength training 2-4 days/week for 20-40 minutes per session.   Minutes exercised this week 60 minutes    Classes attended to date 3             [name] 05/09/2023, 5:08 PM

## 2023-05-12 ENCOUNTER — Encounter: Payer: Self-pay | Admitting: Nurse Practitioner

## 2023-05-12 ENCOUNTER — Ambulatory Visit: Payer: Self-pay | Admitting: Internal Medicine

## 2023-05-12 ENCOUNTER — Ambulatory Visit: Payer: 59 | Admitting: Nurse Practitioner

## 2023-05-12 ENCOUNTER — Telehealth: Payer: Self-pay

## 2023-05-12 VITALS — BP 130/70 | HR 60 | Temp 98.7°F | Ht 64.0 in | Wt 220.4 lb

## 2023-05-12 DIAGNOSIS — I10 Essential (primary) hypertension: Secondary | ICD-10-CM

## 2023-05-12 DIAGNOSIS — D259 Leiomyoma of uterus, unspecified: Secondary | ICD-10-CM | POA: Diagnosis not present

## 2023-05-12 NOTE — Transitions of Care (Post Inpatient/ED Visit) (Signed)
   05/12/2023  Name: MAELYN BERREY MRN: [id number] DOB: 06-02-1955  Today's TOC FU Call Status: Today's TOC FU Call Status:: Successful TOC FU Call Completed TOC FU Call Complete Date: 05/12/23 Patient's Name and Date of Birth confirmed.  Transition Care Management Follow-up Telephone Call Date of Discharge: 05/08/23 Discharge Facility: [facility]) Type of Discharge: Inpatient Admission Primary Inpatient Discharge Diagnosis:: STOMACH PAIN How have you been since you were released from the hospital?: Better Any questions or concerns?: No  Items Reviewed: Did you receive and understand the discharge instructions provided?: Yes Medications obtained,verified, and reconciled?: Yes (Medications Reviewed) Any new allergies since your discharge?: No Dietary orders reviewed?: No Do you have support at home?: Yes People in Home: spouse  Medications Reviewed Today: Medications Reviewed Today   Medications were not reviewed in this encounter     Home Care and Equipment/Supplies: Were Home Health Services Ordered?: No Any new equipment or medical supplies ordered?: No  Functional Questionnaire: Do you need assistance with bathing/showering or dressing?: No Do you need assistance with meal preparation?: No Do you need assistance with eating?: No Do you have difficulty maintaining continence: No Do you need assistance with getting out of bed/getting out of a chair/moving?: No Do you have difficulty managing or taking your medications?: No  Follow up appointments reviewed: PCP Follow-up appointment confirmed?: Yes Date of PCP follow-up appointment?: 05/12/23 Follow-up Provider: [facility] Follow-up appointment confirmed?: No Reason Specialist Follow-Up Not Confirmed: Appointment Sceduled by [facility] Calling Clinician Do you need transportation to your follow-up appointment?: No Do you understand care options if your condition(s) worsen?: Yes-patient  verbalized understanding    SIGNATURE Lisabeth Devoid, CMA

## 2023-05-12 NOTE — Assessment & Plan Note (Addendum)
Seen on CT scan earlier this week, will refer to GYN for further evaluation. Her previous provider was at [facility]. Her abdomen pain is better.

## 2023-05-12 NOTE — Telephone Encounter (Signed)
Message from [place] sent at 05/12/2023  8:28 AM EST  Summary: poss bp and thyroid issues   Copied From CRM [phone]. Reason for Triage: pt concerned her bp may be high.  She doesn't know exactly what her readings are, but when she went to the ED on 1/16, it was a little elevated.  However, pt states she is not having any high bp sx.  She says she wants to see her dr about her thyroid, but only reason she thinks they found thyroid issues when she went to ED on that same day. Pt states she is feeling fine today, but they did tell her to follow up w/ her dr.  No appts until April.  Please advise        Patient is requesting an apt for the f/u of her ER visit.  Office to call patient back; no apts available until April.

## 2023-05-12 NOTE — Assessment & Plan Note (Signed)
Blood pressure is controlled better than when she was seen in the ER. Repeat improved. She is to check her bp daily and call with her readings weekly. Advised to cut back on added salt.

## 2023-05-12 NOTE — Progress Notes (Signed)
Madelaine Bhat, CMA,acting as a Neurosurgeon for Arnette Felts, FNP.,have documented all relevant documentation on the behalf of Arnette Felts, FNP,as directed by  Arnette Felts, FNP while in the presence of Arnette Felts, FNP.  Subjective:  Patient ID: Leslie Davis , female    DOB: [birth year] , 68 y.o.   MRN: [id number]  Chief Complaint  Patient presents with   Hypertension    HPI  Patient presents today for a bp  follow up, Patient reports compliance with medication. Patient denies any chest pain, SOB, or headaches. Patient reports her BP has been running high at all her other appointments.  When in the ER she was there for abdomen pain.  Patient reports when she checks her blood pressure at home it is not high; this morning was 131/60 yesterday was 147/70. This is before her medications. Denies headache.   Patient went to the ER on Sunday for stomach pain, patient reports today she is better.      Past Medical History:  Diagnosis Date   CKD (chronic kidney disease)    stage 3   Coronary artery disease 07/2021   obstructive CAD-70% LAD-treated medically   DM (diabetes mellitus) (HCC)    Fatty liver disease, nonalcoholic    Gastritis    Gastroparesis    GERD (gastroesophageal reflux disease)    Heartburn    Hiatal hernia    HLD (hyperlipidemia)    HTN (hypertension)    Joint pain    Renal calculus    Rheumatoid arthritis (HCC)    Vitamin D deficiency      Family History  Problem Relation Age of Onset   Diabetes Mother    Thyroid disease Mother    Kidney disease Mother    Hypertension Father    Hyperlipidemia Father    Sudden death Father    Stroke Father    Diabetes Sister    Heart disease Sister    Kidney disease Sister    COPD Sister    Heart disease Brother 39   Diabetes Brother    Stomach cancer Maternal Aunt        X 2 aunts   Diabetes Maternal Grandmother    Colon cancer Neg Hx    Esophageal cancer Neg Hx    Rectal cancer Neg Hx    Colon polyps Neg Hx       Current Outpatient Medications:    acetaminophen (TYLENOL) 500 MG tablet, Take 1 tablet (500 mg total) by mouth every 6 (six) hours as needed for mild pain., Disp: 30 tablet, Rfl: 0   ALLERGY RELIEF CETIRIZINE 10 MG tablet, TAKE 1 TABLET BY MOUTH ONCE DAILY, Disp: 30 tablet, Rfl: 10   amLODipine (NORVASC) 10 MG tablet, TAKE ONE TABLET BY MOUTH ONCE DAILY, Disp: 90 tablet, Rfl: 3   ASPIRIN LOW DOSE 81 MG tablet, TAKE 1 TABLET BY MOUTH ONCE DAILY *SWALLOW WHOLE*, Disp: 30 tablet, Rfl: 10   Blood Glucose Monitoring Suppl (ONETOUCH VERIO) w/Device KIT, Use as directed to check blood sugars 2 times per day dx: e11.65, Disp: 1 kit, Rfl: 1   carvedilol (COREG) 3.125 MG tablet, TAKE ONE (1) TABLET BY MOUTH TWICE DAILY WITH MEALS, Disp: 180 tablet, Rfl: 2   celecoxib (CELEBREX) 200 MG capsule, Take 1 capsule (200 mg total) by mouth 2 (two) times daily as needed., Disp: 180 capsule, Rfl: 2   clindamycin (CLEOCIN T) 1 % lotion, Apply 1 Application topically daily as needed (acne)., Disp: , Rfl:  Continuous Glucose Receiver (FREESTYLE LIBRE 3 READER) DEVI, Use to check glucose continuously, Disp: 1 each, Rfl: 0   Continuous Glucose Sensor (FREESTYLE LIBRE 3 PLUS SENSOR) MISC, Change sensor every 15 days., Disp: 6 each, Rfl: 1   EDARBI 80 MG TABS, TAKE ONE TABLET BY MOUTH ONCE DAILY, Disp: 90 tablet, Rfl: 2   ezetimibe (ZETIA) 10 MG tablet, TAKE 1 TABLET BY MOUTH ONCE DAILY, Disp: 90 tablet, Rfl: 1   FARXIGA 10 MG TABS tablet, TAKE ONE TABLET BY MOUTH BEFORE BREAKFAST daily, Disp: 90 tablet, Rfl: 2   ferrous gluconate (FERGON) 324 MG tablet, TAKE 1 TABLET BY MOUTH TWICE DAILY WITH A MEAL., Disp: 100 tablet, Rfl: 0   glucose blood (ONETOUCH VERIO) test strip, USE TO TEST BLOOD SUGAR TWICE DAILY AS DIRECTED, Disp: 100 each, Rfl: 10   insulin lispro (HUMALOG) 100 UNIT/ML KwikPen, Inject 0-6 Units into the skin 3 (three) times daily with meals. Inject up to 3 times daily with meals per sliding scale  (70-150: 2 units, 150-199: 3 units, 200-249: 4 units, 250-299: 5 units, 300-349: 6 units), Disp: 15 mL, Rfl: 3   Insulin Pen Needle (PEN NEEDLES) 31G X 5 MM MISC, 1 each by Does not apply route 3 (three) times daily. May dispense any manufacturer covered by patient's insurance., Disp: 100 each, Rfl: 0   linaclotide ([name]) 145 MCG CAPS capsule, Take 1 capsule (145 mcg total) by mouth daily., Disp: 30 capsule, Rfl: 0   ondansetron (ZOFRAN) 4 MG tablet, TAKE 1 TABLET BY MOUTH EVERY 8 HOURS AS NEEDED FOR NAUSEA & VOMITING, Disp: 20 tablet, Rfl: 10   OneTouch Delica Lancets 33G MISC, Use as directed to check blood sugars 2 times per day dx:e11.65, Disp: 150 each, Rfl: 3   pantoprazole (PROTONIX) 40 MG tablet, TAKE ONE (1) TABLET BY MOUTH TWICE DAILY, Disp: 60 tablet, Rfl: 10   polyethylene glycol (MIRALAX / GLYCOLAX) 17 g packet, Take 17 g by mouth daily as needed for mild constipation., Disp: 14 each, Rfl: 0   rosuvastatin (CRESTOR) 20 MG tablet, TAKE ONE TABLET BY MOUTH ONCE DAILY, Disp: 90 tablet, Rfl: 2   temazepam (RESTORIL) 30 MG capsule, TAKE 1 CAPSULE BY MOUTH AT BEDTIME AS NEEDED *REFILL REQUEST*, Disp: 30 capsule, Rfl: 0   traMADol (ULTRAM) 50 MG tablet, Take 1-2 tablets (50-100 mg total) by mouth daily as needed., Disp: 20 tablet, Rfl: 0   traMADol (ULTRAM) 50 MG tablet, Take 1 tablet (50 mg total) by mouth 2 (two) times daily as needed., Disp: 30 tablet, Rfl: 0   TRESIBA FLEXTOUCH 100 UNIT/ML FlexTouch Pen, Inject 24 units daily. Max daily dose 30 units., Disp: , Rfl:    TRUEPLUS PEN NEEDLES 31G X 6 MM MISC, USE WITH pen TO INJECT insulin DAILY, Disp: 150 each, Rfl: 3   nitroGLYCERIN (NITROSTAT) 0.4 MG SL tablet, Place 1 tablet (0.4 mg total) under the tongue every 5 (five) minutes as needed for chest pain. If you require more than two tablets five minutes apart go to the nearest ER via EMS. (Patient not taking: Reported on 02/23/2023), Disp: 30 tablet, Rfl: 0   Allergies  Allergen Reactions    Fiasp [Insulin Aspart (W-Niacinamide)] Hives     Review of Systems  Constitutional: Negative.   HENT: Negative.    Eyes: Negative.   Respiratory: Negative.    Cardiovascular: Negative.   Gastrointestinal: Negative.      Today's Vitals   05/12/23 1206 05/12/23 1303  BP: 136/80 130/70  Pulse:  60   Temp: 98.7 F (37.1 C)   TempSrc: Oral   Weight: 220 lb 6.4 oz (100 kg)   Height: 5\' 4"  (1.626 m)   PainSc: 3    PainLoc: Knee    Body mass index is 37.83 kg/m.  Wt Readings from Last 3 Encounters:  05/12/23 220 lb 6.4 oz (100 kg)  05/09/23 215 lb (97.5 kg)  05/08/23 215 lb (97.5 kg)     Objective:  Physical Exam Vitals reviewed.  Constitutional:      General: She is not in acute distress.    Appearance: Normal appearance.  Cardiovascular:     Rate and Rhythm: Normal rate and regular rhythm.     Pulses: Normal pulses.     Heart sounds: Normal heart sounds. No murmur heard. Pulmonary:     Effort: Pulmonary effort is normal. No respiratory distress.     Breath sounds: Normal breath sounds. No wheezing.  Skin:    General: Skin is warm and dry.     Capillary Refill: Capillary refill takes less than 2 seconds.  Neurological:     General: No focal deficit present.     Mental Status: She is alert and oriented to person, place, and time.     Cranial Nerves: No cranial nerve deficit.     Motor: No weakness.         Assessment And Plan:  HYPERTENSION, BENIGN SYSTEMIC Assessment & Plan: Blood pressure is controlled better than when she was seen in the ER. Repeat improved. She is to check her bp daily and call with her readings weekly. Advised to cut back on added salt.    Uterine leiomyoma, unspecified location Assessment & Plan: Seen on CT scan earlier this week, will refer to GYN for further evaluation. Her previous provider was at [facility]. Her abdomen pain is better.   Orders: -     Ambulatory referral to Obstetrics / Gynecology    No follow-ups on  file.  Patient was given opportunity to ask questions. Patient verbalized understanding of the plan and was able to repeat key elements of the plan. All questions were answered to their satisfaction.    Jeanell Sparrow, FNP, have reviewed all documentation for this visit. The documentation on 05/12/23 for the exam, diagnosis, procedures, and orders are all accurate and complete.   IF YOU HAVE BEEN REFERRED TO A SPECIALIST, IT MAY TAKE 1-2 WEEKS TO SCHEDULE/PROCESS THE REFERRAL. IF YOU HAVE NOT HEARD FROM US/SPECIALIST IN TWO WEEKS, PLEASE GIVE [place] A CALL AT [phone].

## 2023-05-16 ENCOUNTER — Encounter: Payer: Self-pay | Admitting: *Deleted

## 2023-05-16 NOTE — Progress Notes (Signed)
 [facility] Weekly Session  Patient Details  Name: Leslie Davis MRN: [id number] Date of Birth: October 31, 1955 Age: 68 y.o. PCP: Dorothyann Peng, MD  Vitals:   05/16/23 1300  Weight: 217 lb (98.4 kg)     [facility] Weekly seesion - 05/16/23 1500       [facility] Location   [facility]      Weekly Session   Topic Discussed Healthy eating tips   Healthy carbohydrates, fats and proteins. Keeping added sugars to 24g/day for women, 36g/day for men. Sodium to 1500-2400mg /day.   Minutes exercised this week 90 minutes    Classes attended to date 4             [name] 05/16/2023, 7:50 PM

## 2023-05-19 IMAGING — CR DG CHEST 2V
2 series · 2 of 2 positions shown · non-contrast
Comparison: Chest x-ray 09/06/2019. Right shoulder series
05/07/2019.

CLINICAL DATA: Preoperative exam for left knee surgery.

EXAM:
CHEST - 2 VIEW

[w chest pa]
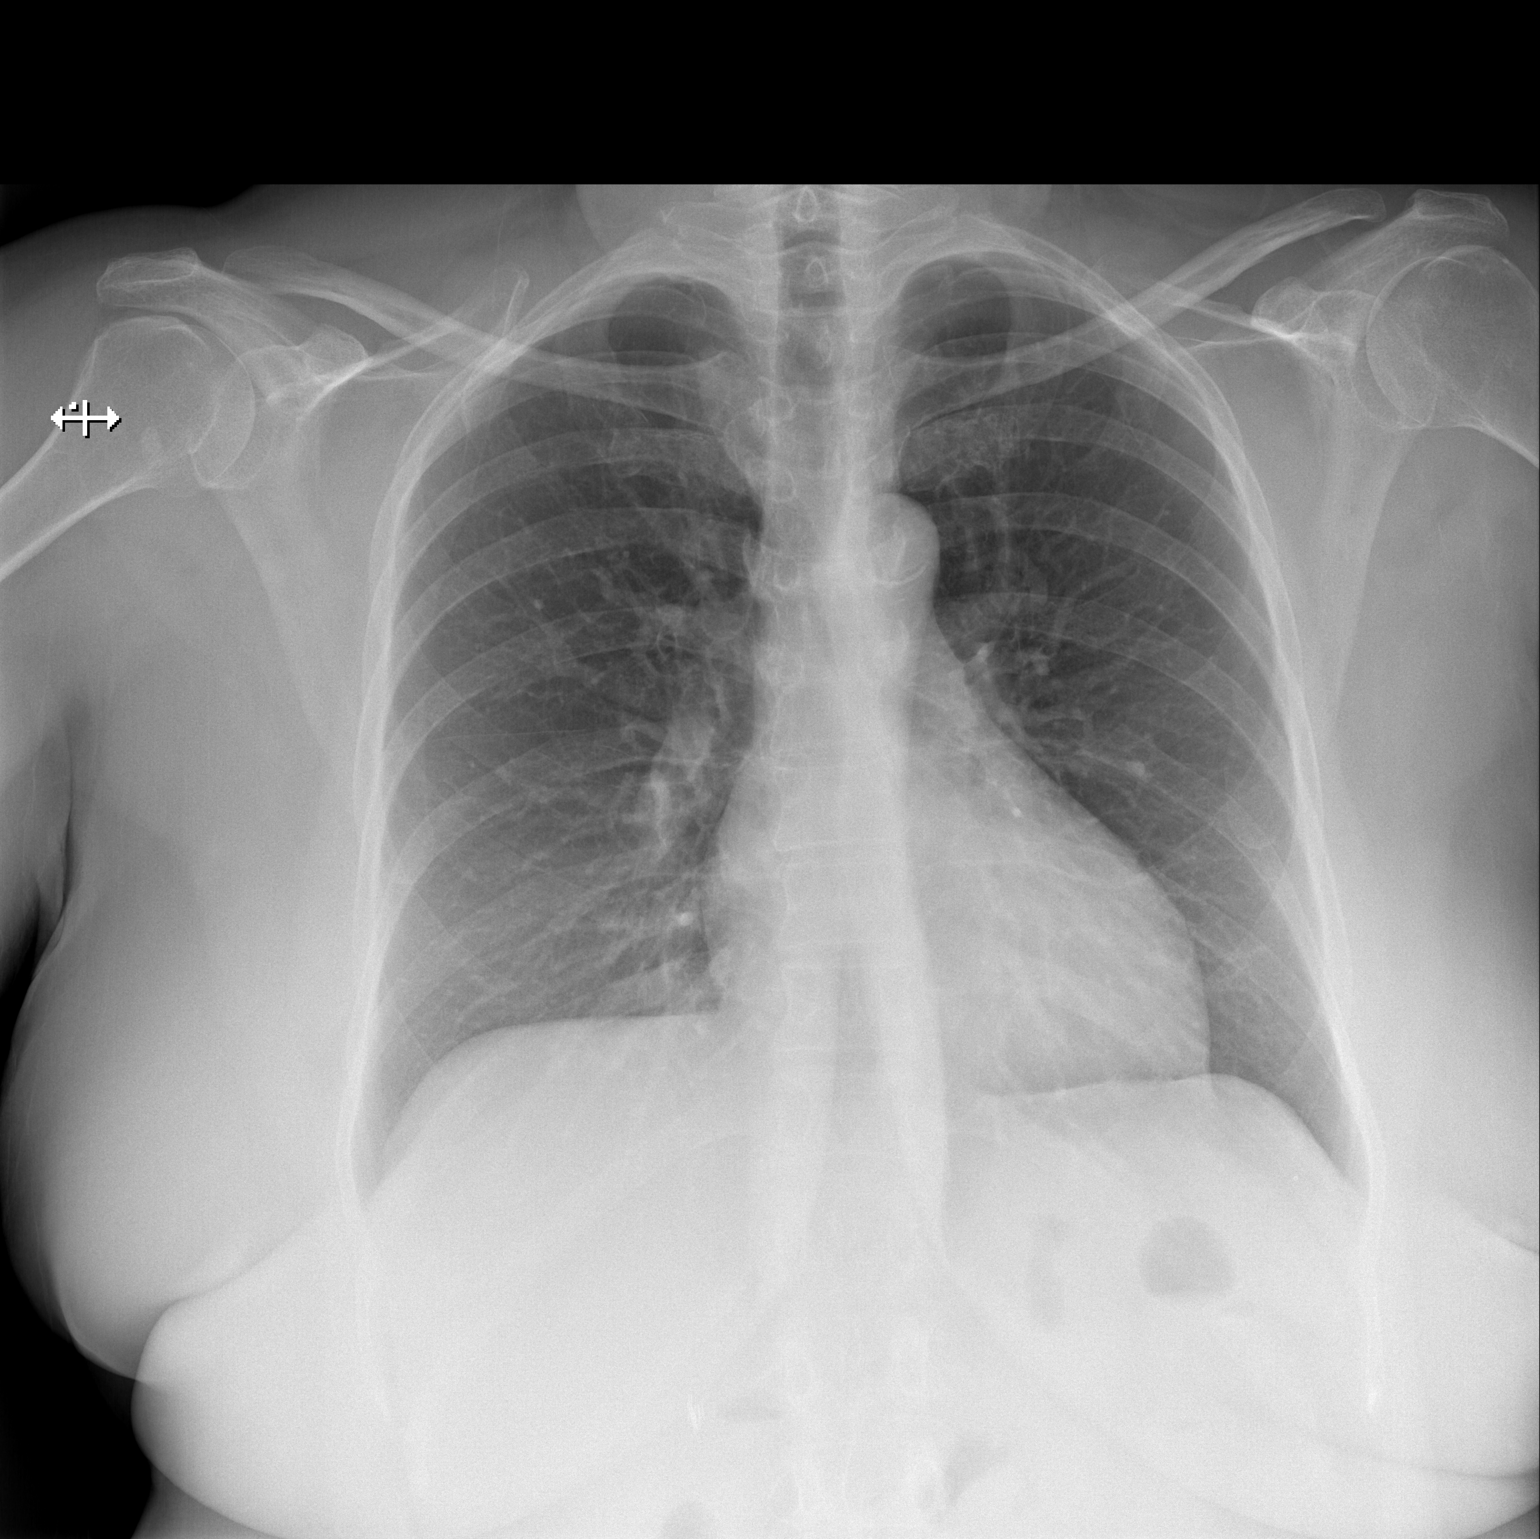

[w chest lat]
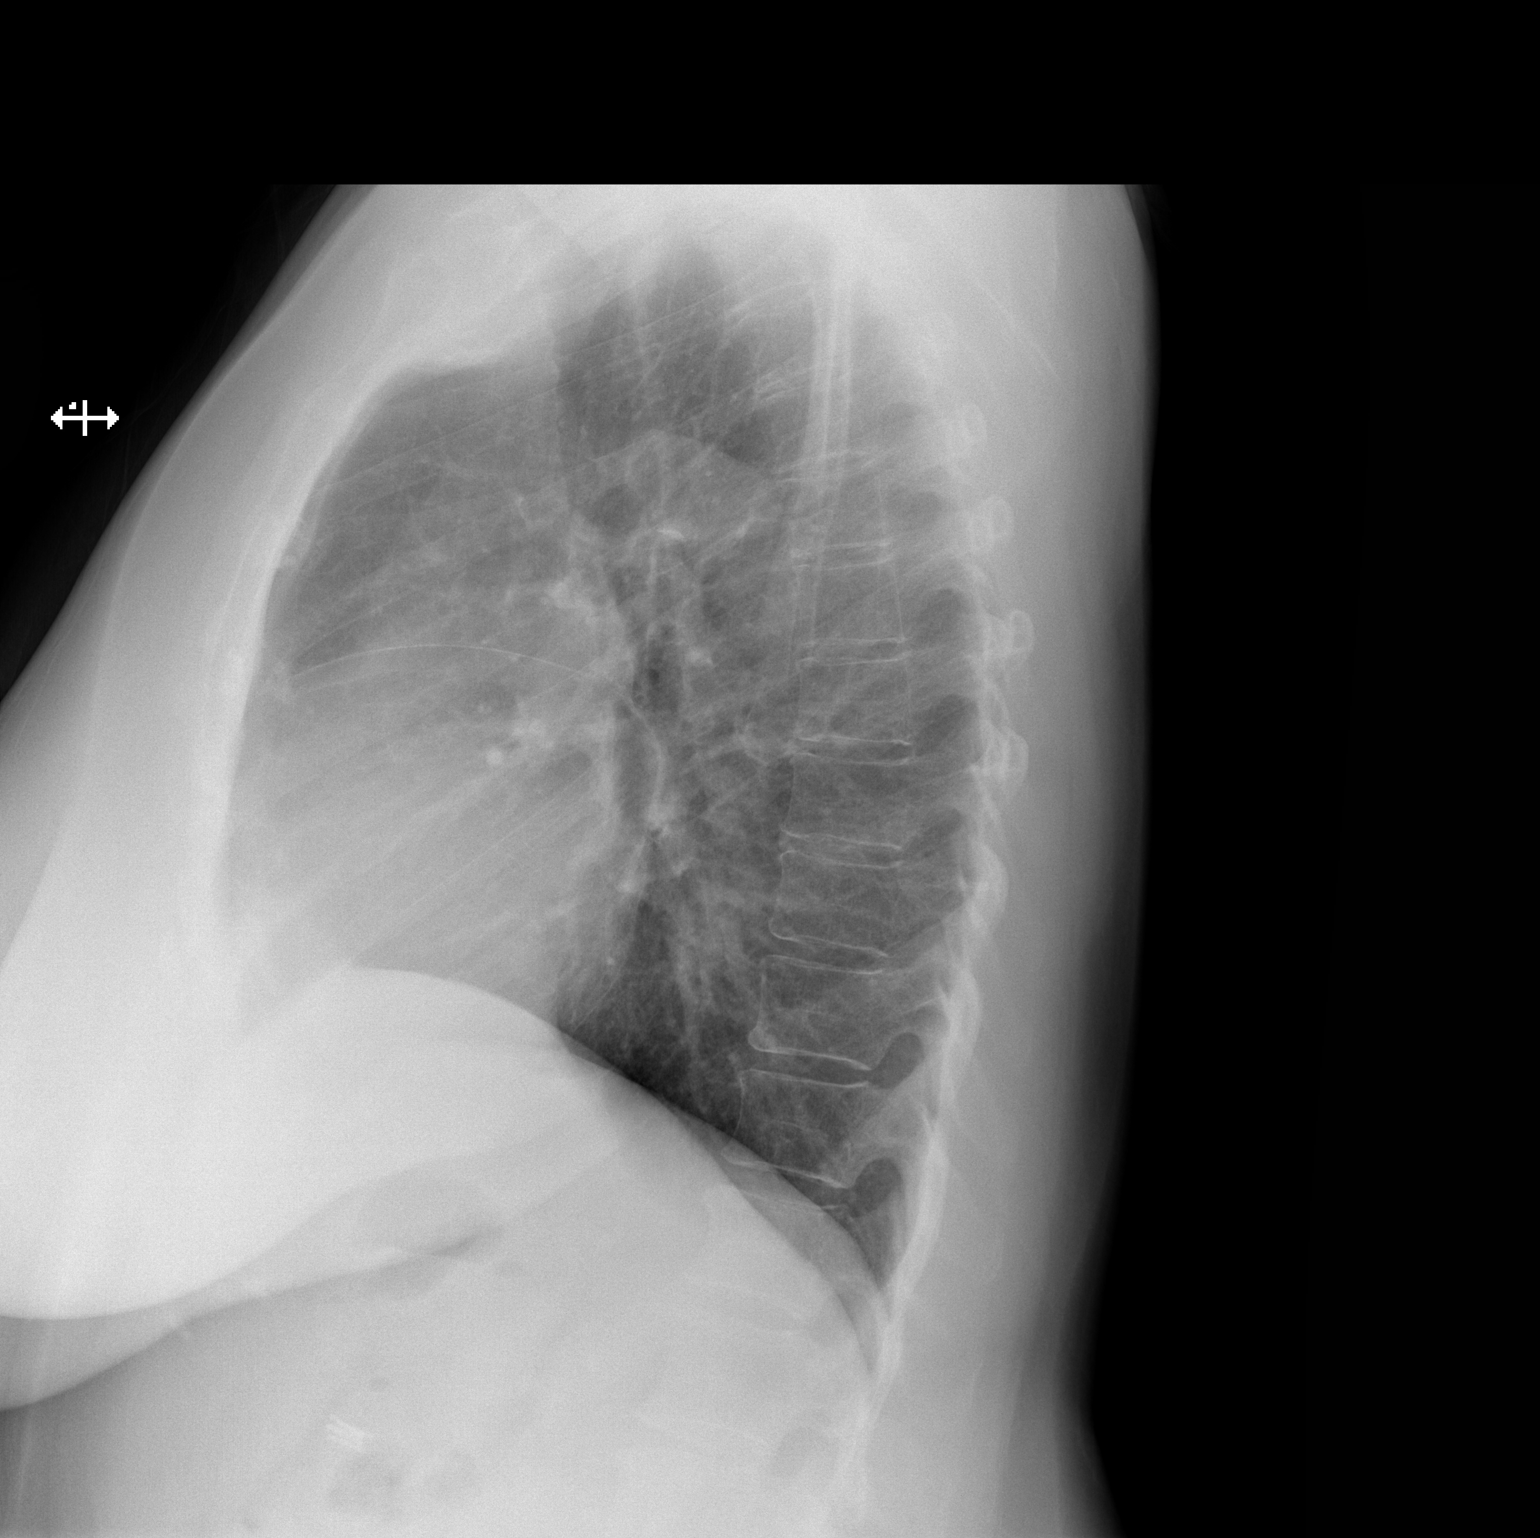

[2 of 2 positions shown; findings below may reference images not displayed]

FINDINGS: The heart size and mediastinal contours are within normal limits.
Both lungs are clear. No pleural effusion or pneumothorax. Stable
radiopacity noted over the right shoulder most likely loose body,
similar finding noted on prior right shoulder series of 05/07/2019.
No acute bony abnormality.
IMPRESSION: No active cardiopulmonary disease.

## 2023-05-23 ENCOUNTER — Encounter: Payer: Self-pay | Admitting: *Deleted

## 2023-05-23 NOTE — Progress Notes (Signed)
 [facility] Weekly Session  Patient Details  Name: Leslie Davis MRN: [id number] Date of Birth: May 03, 1955 Age: 68 y.o. PCP: Dorothyann Peng, MD  Vitals:   05/23/23 1300  Weight: 217 lb (98.4 kg)     [facility] Weekly seesion - 05/23/23 1500       [facility] Location   [facility]      Weekly Session   Topic Discussed Health habits;Water   Staying motivted, addicting nature of sugar, tips for sugar reduction, liquid sugar, hidden sugar, sugar demo   Minutes exercised this week 240 minutes    Classes attended to date 6             [name] 05/23/2023, 8:01 PM

## 2023-05-24 IMAGING — DX DG KNEE 1-2V PORT*L*
1 series · 2 of 2 positions shown · non-contrast
Comparison: 09/03/2020.

CLINICAL DATA: Left total knee arthroplasty.

EXAM:
PORTABLE LEFT KNEE - 1-2 VIEW

[Series 1: knee · 0.14mm/px · 2 of 2 slices shown]
[im 1/2]
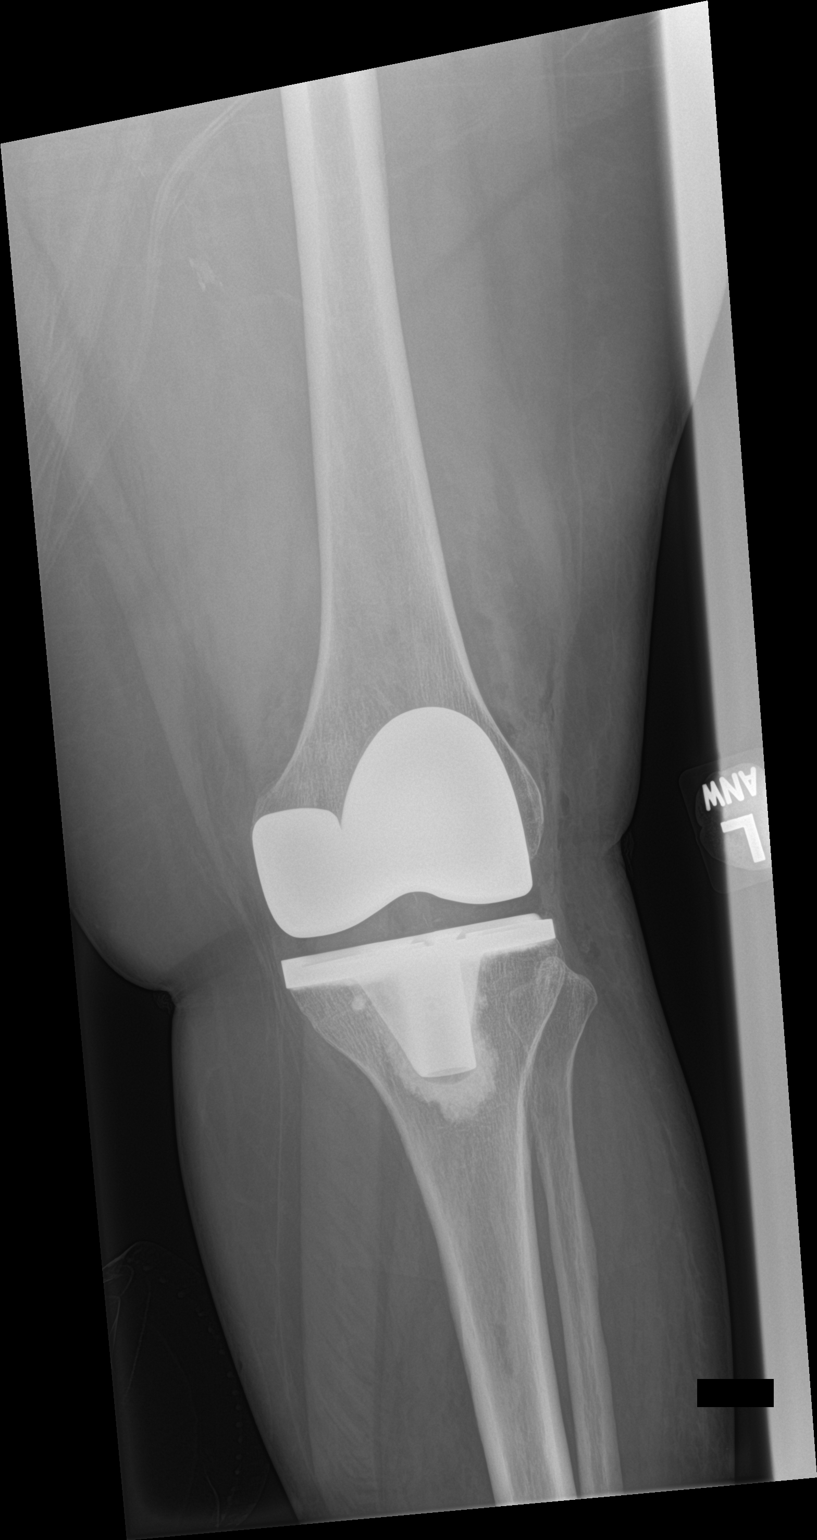
[im 2/2]
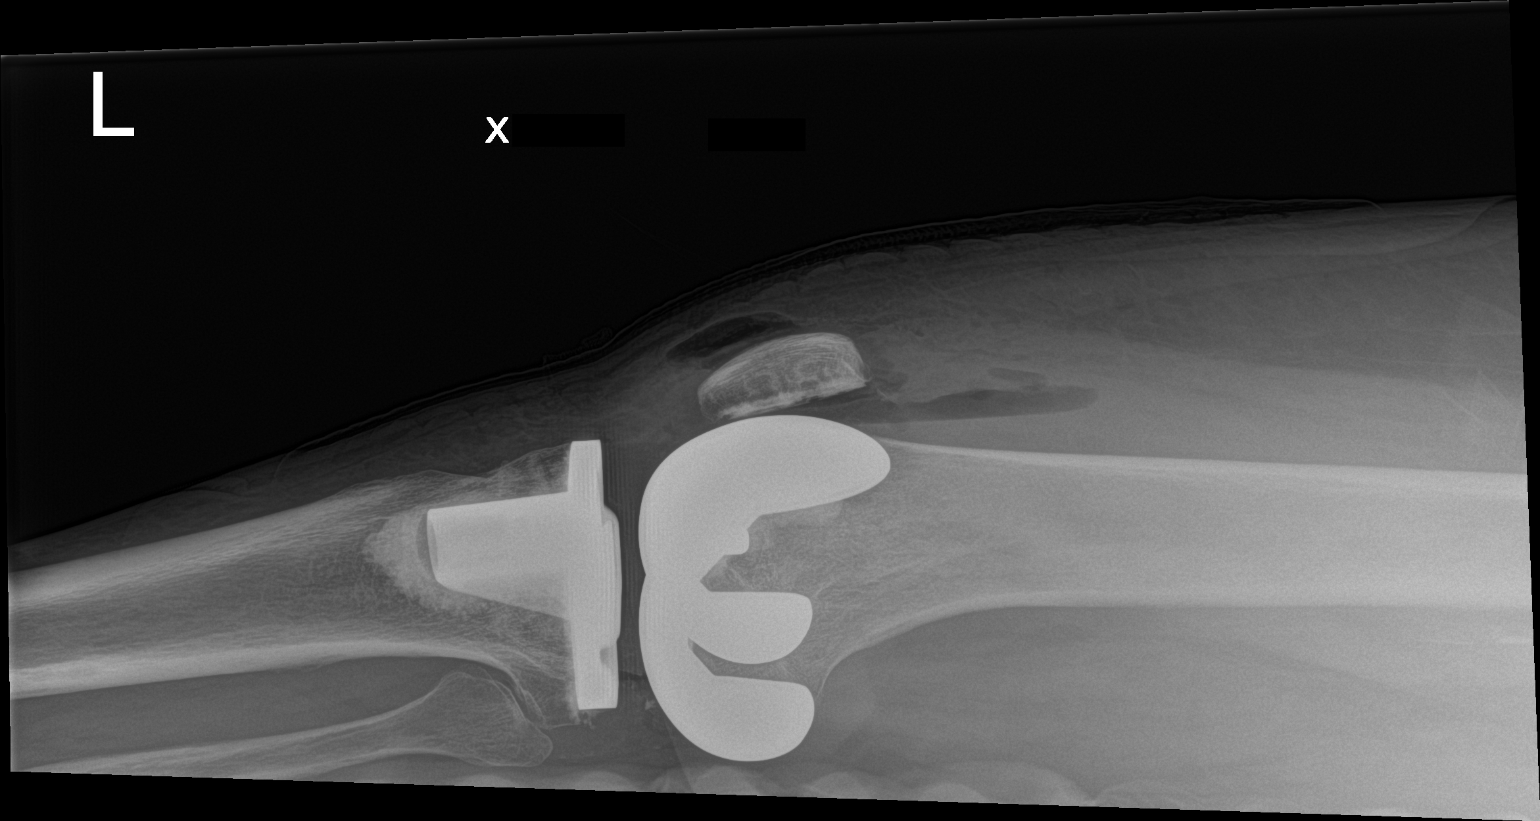

[2 of 2 positions shown; findings below may reference images not displayed]

FINDINGS: Interval left total knee arthroplasty. Subcutaneous/joint air and
fluid are present.
IMPRESSION: Left total knee arthroplasty with expected postoperative findings.

## 2023-05-30 ENCOUNTER — Encounter: Payer: Self-pay | Admitting: *Deleted

## 2023-05-30 ENCOUNTER — Other Ambulatory Visit: Payer: Self-pay | Admitting: Surgery

## 2023-05-30 DIAGNOSIS — C7A8 Other malignant neuroendocrine tumors: Secondary | ICD-10-CM

## 2023-05-30 NOTE — Progress Notes (Signed)
 [facility] Weekly Session  Patient Details  Name: Leslie Davis MRN: [id number] Date of Birth: 28-Jan-1956 Age: 68 y.o. PCP: Dorothyann Peng, MD  Vitals:   05/30/23 1437  Weight: 217 lb (98.4 kg)     [facility] Weekly seesion - 05/30/23 1400       [facility]   [facility]      Weekly Session   Topic Discussed Restaurant Eating   Sodium intake 1500-2300mg /day. Effects of sodium on your body, salt demo   Minutes exercised this week 300 minutes    Classes attended to date 8             [name] 05/30/2023, 2:39 PM

## 2023-06-06 ENCOUNTER — Encounter: Payer: Self-pay | Admitting: *Deleted

## 2023-06-06 NOTE — Progress Notes (Signed)
 [facility] Weekly Session  Patient Details  Name: Leslie Davis MRN: [id number] Date of Birth: [birth year] Age: 68 y.o. PCP: Dorothyann Peng, MD  Vitals:   06/06/23 1438  Weight: 218 lb (98.9 kg)     [facility] Weekly seesion - 06/06/23 1400       [facility]   [facility]      Weekly Session   Topic Discussed Stress management and problem solving   Importance of sleep and stratagies to improve sleep quality, meditation exercise.   Minutes exercised this week 340 minutes    Classes attended to date 10             [name] 06/06/2023, 2:40 PM

## 2023-06-10 ENCOUNTER — Telehealth: Payer: Self-pay

## 2023-06-10 NOTE — Telephone Encounter (Signed)
 Patient was identified as falling into the [place] Measure - Diabetes.   Patient was: Appointment scheduled for lab or office visit for A1c.

## 2023-06-13 ENCOUNTER — Encounter: Payer: Self-pay | Admitting: *Deleted

## 2023-06-13 NOTE — Progress Notes (Signed)
 [facility] Weekly Session  Patient Details  Name: Leslie Davis MRN: [id number] Date of Birth: 1955-08-28 Age: 68 y.o. PCP: Dorothyann Peng, MD  Vitals:   06/13/23 1434  Weight: 215 lb 3.2 oz (97.6 kg)     [facility] Weekly seesion - 06/13/23 1400       [facility] Location   [facility]      Weekly Session   Topic Discussed Expectations and non-scale victories   Halfway through! Restate and reset goals. Permanent objectives. Discuss Blue zones.   Minutes exercised this week 610 minutes    Classes attended to date 76             [name] 06/13/2023, 2:35 PM

## 2023-06-20 ENCOUNTER — Encounter: Payer: Self-pay | Admitting: *Deleted

## 2023-06-20 NOTE — Progress Notes (Signed)
 [facility] Weekly Session  Patient Details  Name: Leslie Davis MRN: [id number] Date of Birth: February 28, 1956 Age: 68 y.o. PCP: Dorothyann Peng, MD  Vitals:   06/20/23 1437  Weight: 216 lb 6.4 oz (98.2 kg)     [facility] Weekly seesion - 06/20/23 1400       [facility]PREP[facility] Location   [facility]      Weekly Session   Topic Discussed Other   Portion control, confusing and deceptive labeling   Minutes exercised this week 220 minutes    Classes attended to date 14             [name] 06/20/2023, 2:38 PM

## 2023-06-21 ENCOUNTER — Encounter: Payer: Self-pay | Admitting: Orthopaedic Surgery

## 2023-06-21 ENCOUNTER — Other Ambulatory Visit: Payer: Self-pay | Admitting: Internal Medicine

## 2023-06-27 ENCOUNTER — Ambulatory Visit
Admission: RE | Admit: 2023-06-27 | Discharge: 2023-06-27 | Disposition: A | Discharge status: Home / Self Care | Source: Ambulatory Visit | Attending: Surgery

## 2023-06-27 DIAGNOSIS — C7A8 Other malignant neuroendocrine tumors: Secondary | ICD-10-CM

## 2023-06-27 DIAGNOSIS — C7A019 Malignant carcinoid tumor of the small intestine, unspecified portion: Secondary | ICD-10-CM | POA: Diagnosis not present

## 2023-06-27 MED ORDER — IOPAMIDOL (ISOVUE-300) INJECTION 61%
80.0000 mL | Freq: Once | INTRAVENOUS | Status: AC | PRN
  Administered 2023-06-27: 80 mL via INTRAVENOUS

## 2023-06-30 DIAGNOSIS — C7A8 Other malignant neuroendocrine tumors: Secondary | ICD-10-CM | POA: Diagnosis not present

## 2023-06-30 DIAGNOSIS — Z09 Encounter for follow-up examination after completed treatment for conditions other than malignant neoplasm: Secondary | ICD-10-CM | POA: Diagnosis not present

## 2023-07-06 ENCOUNTER — Encounter: Payer: Self-pay | Admitting: Internal Medicine

## 2023-07-06 ENCOUNTER — Ambulatory Visit: Payer: 59 | Admitting: Internal Medicine

## 2023-07-06 VITALS — BP 132/80 | HR 53 | Temp 97.7°F | Ht 64.0 in | Wt 218.4 lb

## 2023-07-06 DIAGNOSIS — Z0001 Encounter for general adult medical examination with abnormal findings: Secondary | ICD-10-CM

## 2023-07-06 DIAGNOSIS — G8929 Other chronic pain: Secondary | ICD-10-CM | POA: Diagnosis not present

## 2023-07-06 DIAGNOSIS — M791 Myalgia, unspecified site: Secondary | ICD-10-CM | POA: Diagnosis not present

## 2023-07-06 DIAGNOSIS — M25562 Pain in left knee: Secondary | ICD-10-CM

## 2023-07-06 DIAGNOSIS — E1122 Type 2 diabetes mellitus with diabetic chronic kidney disease: Secondary | ICD-10-CM

## 2023-07-06 DIAGNOSIS — I1310 Hypertensive heart and chronic kidney disease without heart failure, with stage 1 through stage 4 chronic kidney disease, or unspecified chronic kidney disease: Secondary | ICD-10-CM

## 2023-07-06 DIAGNOSIS — E785 Hyperlipidemia, unspecified: Secondary | ICD-10-CM

## 2023-07-06 DIAGNOSIS — D259 Leiomyoma of uterus, unspecified: Secondary | ICD-10-CM

## 2023-07-06 DIAGNOSIS — N1831 Chronic kidney disease, stage 3a: Secondary | ICD-10-CM | POA: Diagnosis not present

## 2023-07-06 DIAGNOSIS — E66812 Obesity, class 2: Secondary | ICD-10-CM

## 2023-07-06 DIAGNOSIS — Z0000 Encounter for general adult medical examination without abnormal findings: Secondary | ICD-10-CM | POA: Insufficient documentation

## 2023-07-06 DIAGNOSIS — Z6836 Body mass index (BMI) 36.0-36.9, adult: Secondary | ICD-10-CM

## 2023-07-06 DIAGNOSIS — Z79899 Other long term (current) drug therapy: Secondary | ICD-10-CM | POA: Diagnosis not present

## 2023-07-06 DIAGNOSIS — Z859 Personal history of malignant neoplasm, unspecified: Secondary | ICD-10-CM

## 2023-07-06 DIAGNOSIS — E1169 Type 2 diabetes mellitus with other specified complication: Secondary | ICD-10-CM | POA: Diagnosis not present

## 2023-07-06 DIAGNOSIS — Z794 Long term (current) use of insulin: Secondary | ICD-10-CM

## 2023-07-06 DIAGNOSIS — I25118 Atherosclerotic heart disease of native coronary artery with other forms of angina pectoris: Secondary | ICD-10-CM | POA: Diagnosis not present

## 2023-07-06 LAB — POCT URINALYSIS DIPSTICK
Bilirubin, UA: NEGATIVE
Blood, UA: NEGATIVE
Glucose, UA: POSITIVE — AB
Ketones, UA: NEGATIVE
Leukocytes, UA: NEGATIVE
Nitrite, UA: NEGATIVE
Protein, UA: POSITIVE — AB
Spec Grav, UA: 1.02 (ref 1.010–1.025)
Urobilinogen, UA: 0.2 U/dL
pH, UA: 6 (ref 5.0–8.0)

## 2023-07-06 MED ORDER — ROSUVASTATIN CALCIUM 20 MG PO TABS: 20.0000 mg | ORAL_TABLET | Freq: Every day | ORAL | 2 refills | Status: DC

## 2023-07-06 NOTE — Progress Notes (Signed)
 I,Victoria T Basil Lim, CMA,acting as a Neurosurgeon for Smiley Dung, MD.,have documented all relevant documentation on the behalf of Smiley Dung, MD,as directed by  Smiley Dung, MD while in the presence of Smiley Dung, MD.  Subjective:    Patient ID: Leslie Davis , female    DOB: 1956-01-30 , 68 y.o.   MRN: [id number]  Chief Complaint  Patient presents with   Annual Exam    Patient presents today for annual exam. She reports compliance with medications. Denies headache, chest pain & sob.  She would like to discuss a medication phentermine she would like to be put on to help with her appetite.  She will soon call to schedule eye exam.     Hypertension   Hyperlipidemia    HPI Discussed the use of AI scribe software for clinical note transcription with the patient, who gave verbal consent to proceed.  History of Present Illness MARESA IRISH "Eveleen Hinds" is a 68 year old female who presents for an annual physical exam and review of her diabetes and high blood pressure.  Her blood sugar was 170 mg/dL this morning, which she attributes to cravings for sweets.  She is currently taking Farxiga  10 mg for her diabetes and uses an insulin  sliding scale. Her Tresiba  dosage was increased from 18 to 24 units in January. She has not seen her diabetes specialist since January.  She takes amlodipine  10 mg daily, carvedilol  twice daily, and a baby aspirin  for her blood pressure, confirming daily adherence. She also takes rosuvastatin  20 mg daily for cholesterol and ezetimibe . She has not had her eye exam yet, which was due in December.  She reports muscle spasms in her leg and back, sometimes managed with tramadol  and arthritis Tylenol . She has a history of knee surgery and experiences persistent knee pain. Her orthopedic surgeon suggested another surgery due to a gap in her knee, contingent on better sugar control.  She exercises twice a week at the [facility] [place] [facility] and drinks more than two bottles  of water  daily. She does not consume much fruit but eats vegetables regularly. She has regular bowel movements and keeps up with her mammograms. She occasionally takes temazepam  for sleep.   Diabetes She presents for her follow-up diabetic visit. She has type 2 diabetes mellitus. Her disease course has been stable. Pertinent negatives for diabetes include no blurred vision, no polydipsia, no polyphagia and no polyuria. There are no hypoglycemic complications. Diabetic complications include nephropathy. Risk factors for coronary artery disease include diabetes mellitus, dyslipidemia, hypertension and post-menopausal. She participates in exercise intermittently. An ACE inhibitor/angiotensin II receptor blocker is being taken. Eye exam is current.  Hypertension This is a chronic problem. The current episode started more than 1 year ago. The problem has been gradually improving since onset. Pertinent negatives include no blurred vision. Risk factors for coronary artery disease include dyslipidemia and diabetes mellitus. The current treatment provides moderate improvement.     Past Medical History:  Diagnosis Date   CKD (chronic kidney disease)    stage 3   Coronary artery disease 07/2021   obstructive CAD-70% LAD-treated medically   DM (diabetes mellitus) (HCC)    Fatty liver disease, nonalcoholic    Gastritis    Gastroparesis    GERD (gastroesophageal reflux disease)    Heartburn    Hiatal hernia    HLD (hyperlipidemia)    HTN (hypertension)    Joint pain    Renal calculus    Rheumatoid arthritis (HCC)  Vitamin D  deficiency      Family History  Problem Relation Age of Onset   Diabetes Mother    Thyroid  disease Mother    Kidney disease Mother    Hypertension Father    Hyperlipidemia Father    Sudden death Father    Stroke Father    Diabetes Sister    Heart disease Sister    Kidney disease Sister    COPD Sister    Heart disease Brother 41   Diabetes Brother    Stomach cancer  Maternal Aunt        X 2 aunts   Diabetes Maternal Grandmother    Colon cancer Neg Hx    Esophageal cancer Neg Hx    Rectal cancer Neg Hx    Colon polyps Neg Hx      Current Outpatient Medications:    acetaminophen  (TYLENOL ) 500 MG tablet, Take 1 tablet (500 mg total) by mouth every 6 (six) hours as needed for mild pain., Disp: 30 tablet, Rfl: 0   ALLERGY RELIEF CETIRIZINE  10 MG tablet, TAKE 1 TABLET BY MOUTH ONCE DAILY, Disp: 30 tablet, Rfl: 10   amLODipine  (NORVASC ) 10 MG tablet, TAKE ONE TABLET BY MOUTH ONCE DAILY, Disp: 90 tablet, Rfl: 3   ASPIRIN  LOW DOSE 81 MG tablet, TAKE 1 TABLET BY MOUTH ONCE DAILY *SWALLOW WHOLE*, Disp: 30 tablet, Rfl: 10   Blood Glucose Monitoring Suppl (ONETOUCH VERIO) w/Device KIT, Use as directed to check blood sugars 2 times per day dx: e11.65, Disp: 1 kit, Rfl: 1   carvedilol  (COREG ) 3.125 MG tablet, TAKE ONE (1) TABLET BY MOUTH TWICE DAILY WITH MEALS, Disp: 180 tablet, Rfl: 2   celecoxib  (CELEBREX ) 200 MG capsule, Take 1 capsule (200 mg total) by mouth 2 (two) times daily as needed., Disp: 180 capsule, Rfl: 2   clindamycin (CLEOCIN T) 1 % lotion, Apply 1 Application topically daily as needed (acne)., Disp: , Rfl:    Continuous Glucose Receiver (FREESTYLE LIBRE 3 READER) DEVI, Use to check glucose continuously, Disp: 1 each, Rfl: 0   Continuous Glucose Sensor (FREESTYLE LIBRE 3 PLUS SENSOR) MISC, Change sensor every 15 days., Disp: 6 each, Rfl: 1   EDARBI  80 MG TABS, TAKE 1 TABLET BY MOUTH ONCE DAILY, Disp: 30 tablet, Rfl: 10   ezetimibe  (ZETIA ) 10 MG tablet, TAKE 1 TABLET BY MOUTH ONCE DAILY, Disp: 90 tablet, Rfl: 1   FARXIGA  10 MG TABS tablet, TAKE 1 TABLET BY MOUTH DAILY BEFORE BREAKFAST, Disp: 30 tablet, Rfl: 10   ferrous gluconate  (FERGON) 324 MG tablet, TAKE 1 TABLET BY MOUTH TWICE DAILY WITH A MEAL., Disp: 100 tablet, Rfl: 0   glucose blood (ONETOUCH VERIO) test strip, USE TO TEST BLOOD SUGAR TWICE DAILY AS DIRECTED, Disp: 100 each, Rfl: 10   insulin   lispro (HUMALOG ) 100 UNIT/ML KwikPen, Inject 0-6 Units into the skin 3 (three) times daily with meals. Inject up to 3 times daily with meals per sliding scale (70-150: 2 units, 150-199: 3 units, 200-249: 4 units, 250-299: 5 units, 300-349: 6 units), Disp: 15 mL, Rfl: 3   Insulin  Pen Needle (PEN NEEDLES) 31G X 5 MM MISC, 1 each by Does not apply route 3 (three) times daily. May dispense any manufacturer covered by patient's insurance., Disp: 100 each, Rfl: 0   linaclotide  ([name] ) 145 MCG CAPS capsule, Take 1 capsule (145 mcg total) by mouth daily., Disp: 30 capsule, Rfl: 0   ondansetron  (ZOFRAN ) 4 MG tablet, TAKE 1 TABLET BY MOUTH EVERY 8 HOURS  AS NEEDED FOR NAUSEA & VOMITING, Disp: 20 tablet, Rfl: 10   OneTouch Delica Lancets 33G MISC, Use as directed to check blood sugars 2 times per day dx:e11.65, Disp: 150 each, Rfl: 3   pantoprazole  (PROTONIX ) 40 MG tablet, TAKE ONE (1) TABLET BY MOUTH TWICE DAILY, Disp: 60 tablet, Rfl: 10   polyethylene glycol (MIRALAX  / GLYCOLAX ) 17 g packet, Take 17 g by mouth daily as needed for mild constipation., Disp: 14 each, Rfl: 0   temazepam  (RESTORIL ) 30 MG capsule, TAKE 1 CAPSULE BY MOUTH AT BEDTIME AS NEEDED *REFILL REQUEST*, Disp: 30 capsule, Rfl: 0   traMADol  (ULTRAM ) 50 MG tablet, Take 1 tablet (50 mg total) by mouth 2 (two) times daily as needed., Disp: 30 tablet, Rfl: 0   TRESIBA  FLEXTOUCH 100 UNIT/ML FlexTouch Pen, Inject 24 units daily. Max daily dose 30 units., Disp: , Rfl:    TRUEPLUS PEN NEEDLES 31G X 6 MM MISC, USE WITH pen TO INJECT insulin  DAILY, Disp: 150 each, Rfl: 3   nitroGLYCERIN  (NITROSTAT ) 0.4 MG SL tablet, Place 1 tablet (0.4 mg total) under the tongue every 5 (five) minutes as needed for chest pain. If you require more than two tablets five minutes apart go to the nearest ER via EMS. (Patient not taking: Reported on 02/23/2023), Disp: 30 tablet, Rfl: 0   rosuvastatin  (CRESTOR ) 20 MG tablet, Take 1 tablet (20 mg total) by mouth daily., Disp: 90  tablet, Rfl: 2   Allergies  Allergen Reactions   Fiasp  [Insulin  Aspart (W-Niacinamide)] Hives      The patient states she uses post menopausal status for birth control. No LMP recorded. Patient is postmenopausal.. Negative for Dysmenorrhea. Negative for: breast discharge, breast lump(s), breast pain and breast self exam. Associated symptoms include abnormal vaginal bleeding. Pertinent negatives include abnormal bleeding (hematology), anxiety, decreased libido, depression, difficulty falling sleep, dyspareunia, history of infertility, nocturia, sexual dysfunction, sleep disturbances, urinary incontinence, urinary urgency, vaginal discharge and vaginal itching. Diet regular.The patient states her exercise level is    . The patient's tobacco use is:  Social History   Tobacco Use  Smoking Status Former   Current packs/day: 0.00   Average packs/day: 0.3 packs/day for 1 year (0.3 ttl pk-yrs)   Types: Cigarettes   Start date: 09/09/1973   Quit date: 09/10/1974   Years since quitting: 48.8  Smokeless Tobacco Never  Tobacco Comments   she no longer smokes.   . She has been exposed to passive smoke. The patient's alcohol use is:  Social History   Substance and Sexual Activity  Alcohol Use Never    Review of Systems  Constitutional: Negative.   HENT: Negative.    Eyes: Negative.  Negative for blurred vision.  Respiratory: Negative.    Cardiovascular: Negative.   Gastrointestinal: Negative.   Endocrine: Negative.  Negative for polydipsia, polyphagia and polyuria.  Genitourinary: Negative.   Musculoskeletal: Negative.   Skin: Negative.   Allergic/Immunologic: Negative.   Neurological: Negative.   Hematological: Negative.   Psychiatric/Behavioral: Negative.       Today's Vitals   07/06/23 1022  BP: 132/80  Pulse: (!) 53  Temp: 97.7 F (36.5 C)  SpO2: 98%  Weight: 218 lb 6.4 oz (99.1 kg)  Height: 5\' 4"  (1.626 m)   Body mass index is 37.49 kg/m.  Wt Readings from Last 3  Encounters:  07/11/23 217 lb (98.4 kg)  07/06/23 218 lb 6.4 oz (99.1 kg)  06/20/23 216 lb 6.4 oz (98.2 kg)     Objective:  Physical Exam Vitals and nursing note reviewed.  Constitutional:      Appearance: Normal appearance.  HENT:     Head: Normocephalic and atraumatic.     Right Ear: Tympanic membrane, ear canal and external ear normal.     Left Ear: Tympanic membrane, ear canal and external ear normal.     Nose: Nose normal.     Mouth/Throat:     Mouth: Mucous membranes are moist.     Pharynx: Oropharynx is clear.  Eyes:     Extraocular Movements: Extraocular movements intact.     Conjunctiva/sclera: Conjunctivae normal.     Pupils: Pupils are equal, round, and reactive to light.  Cardiovascular:     Rate and Rhythm: Normal rate and regular rhythm.     Pulses: Normal pulses.          Dorsalis pedis pulses are 2+ on the right side and 2+ on the left side.     Heart sounds: Normal heart sounds.  Pulmonary:     Effort: Pulmonary effort is normal.     Breath sounds: Normal breath sounds.  Chest:  Breasts:    Tanner Score is 5.     Right: Normal.     Comments: LEFT BREAST NOT PALPATED DUE TO PATIENT REQUEST, RECENTLY HAD TWO BREAST BIOPSIES, BANDAGES IN PLACE Abdominal:     General: Bowel sounds are normal.     Palpations: Abdomen is soft.     Comments: Obese, soft  Genitourinary:    Comments: deferred Musculoskeletal:        General: Normal range of motion.     Cervical back: Normal range of motion and neck supple.     Comments: HEALED SURGICAL SCAR LEFT KNEE  Feet:     Right foot:     Protective Sensation: 5 sites tested.  5 sites sensed.     Skin integrity: Skin integrity normal.     Toenail Condition: Right toenails are normal.     Left foot:     Protective Sensation: 5 sites tested.  5 sites sensed.     Skin integrity: Skin integrity normal.     Toenail Condition: Left toenails are normal.  Skin:    General: Skin is warm and dry.  Neurological:     General:  No focal deficit present.     Mental Status: She is alert and oriented to person, place, and time.  Psychiatric:        Mood and Affect: Mood normal.        Behavior: Behavior normal.         Assessment And Plan:     Encounter for annual health examination Assessment & Plan: A full exam was performed.  Importance of monthly self breast exams was discussed with the patient.  She is advised to get 30-45 minutes of regular exercise, no less than four to five days per week. Both weight-bearing and aerobic exercises are recommended.  She is advised to follow a healthy diet with at least six fruits/veggies per day, decrease intake of red meat and other saturated fats and to increase fish intake to twice weekly.  Meats/fish should not be fried -- baked, boiled or broiled is preferable. It is also important to cut back on your sugar intake.  Be sure to read labels - try to avoid anything with added sugar, high fructose corn syrup or other sweeteners.  If you must use a sweetener, you can try stevia or monkfruit.  It is also important to  avoid artificially sweetened foods/beverages and diet drinks. Lastly, wear SPF 50 sunscreen on exposed skin and when in direct sunlight for an extended period of time.  Be sure to avoid fast food restaurants and aim for at least 60 ounces of water  daily.       Hypertensive heart and renal disease with renal failure, stage 1 through stage 4 or unspecified chronic kidney disease, without heart failure Assessment & Plan: Chronic, fair control. Goal BP<120/80. EKG performed, marked sinus Bradycardia  W/ negative T-waves, aterolateral ischemia. She is without cp and sob today.  She will c/w amlodipine  10mg , carvedilol  3.125mg  twice daily and Edarbi  80mg  daily.  She is encouraged to follow low sodium diet, make necessary dietary changes and increase daily activity. I will see her in six months.   Orders: -     CBC -     CMP14+EGFR -     Lipid panel -     POCT urinalysis  dipstick -     Microalbumin / creatinine urine ratio -     EKG 12-Lead  Coronary artery disease of native artery of native heart with stable angina pectoris [facility]) Assessment & Plan: Chronic, LDL goal is less than 70. She will continue with ASA 81mg  daily, carvedilol  3.125mg  twice daily and rosuvastatin  20mg  daily. She is encouraged to follow a heart healthy lifestyle.    Type 2 diabetes mellitus with stage 3a chronic kidney disease, with long-term current use of insulin  (HCC) Assessment & Plan: Chronic, diabetic foot exam was performed.  Elevated blood sugars at 170 mg/dL. Difficulty with dietary adherence. Discussed Wellbutrin for cravings. - Adjust Tresiba  dosage. - Discuss dietary modifications. - Consider Wellbutrin for cravings. - Ensure follow-up with diabetes specialist. - Importance of dietary/medication compliance was discussed with the patient.   Orders: -     CBC -     CMP14+EGFR -     Lipid panel -     Hemoglobin A1c  Hyperlipidemia associated with type 2 diabetes mellitus (HCC) Assessment & Plan: Chronic, diabetic foot exam was performed.  LDL goal is less than 70.  She will continue with ezetimibe  10mg  and rosuvastatin  daily. She is now followed by Endo for diabetes. She reports compliance with meds. I DISCUSSED WITH THE PATIENT AT LENGTH REGARDING THE GOALS OF GLYCEMIC CONTROL AND POSSIBLE LONG-TERM COMPLICATIONS.  I  ALSO STRESSED THE IMPORTANCE OF COMPLIANCE WITH HOME GLUCOSE MONITORING, DIETARY RESTRICTIONS INCLUDING AVOIDANCE OF SUGARY DRINKS/PROCESSED FOODS,  ALONG WITH REGULAR EXERCISE.  I  ALSO STRESSED THE IMPORTANCE OF ANNUAL EYE EXAMS, SELF FOOT CARE AND COMPLIANCE WITH OFFICE VISITS.    Myalgia Assessment & Plan: Leg muscle spasms, possible electrolyte imbalance due to low fruit/veggie intake. - Check magnesium  levels. - Encourage two fruits per day. - Consider muscle relaxer if needed. - Encouraged to stay well hydrated.   Orders: -      Magnesium   Chronic pain of left knee Assessment & Plan: Chronic, s/p left TKR several years ago. Ortho has suggested surgical revision; however, she has to first reach A1c goal.  Chronic pain post-surgery. Discussed pain management and surgical risks related to blood sugar control. - Discuss pain management and surgical options. - Encourage weight and blood sugar control.   Class 2 severe obesity due to excess calories with serious comorbidity and body mass index (BMI) of 36.0 to 36.9 in adult [facility]) Assessment & Plan: She is encouraged to strive for BMi less than 30 to decrease cardiac risk. Advised to aim for at least  150 minutes of exercise per week. She is encouraged to return to working out at the gym - aiming for at least 150 minutes of exercise per week.    H/O malignant neuroendocrine tumor Assessment & Plan: She is s/p small bowel neuroendocrine tumor resection about a year ago. Recent imaging shows no evidence of recurrent disease.    Drug therapy -     Vitamin B12  Other orders -     Rosuvastatin  Calcium ; Take 1 tablet (20 mg total) by mouth daily.  Dispense: 90 tablet; Refill: 2    Return for 1 year physical, 6 month bp. Patient was given opportunity to ask questions. Patient verbalized understanding of the plan and was able to repeat key elements of the plan. All questions were answered to their satisfaction.   I, Smiley Dung, MD, have reviewed all documentation for this visit. The documentation on 07/06/23 for the exam, diagnosis, procedures, and orders are all accurate and complete.

## 2023-07-06 NOTE — Patient Instructions (Signed)

## 2023-07-07 LAB — MICROALBUMIN / CREATININE URINE RATIO
Creatinine, Urine: 44.9 mg/dL
Microalb/Creat Ratio: 1984 mg/g{creat} — ABNORMAL HIGH (ref 0–29)
Microalbumin, Urine: 890.6 ug/mL

## 2023-07-07 LAB — CMP14+EGFR
ALT: 17 IU/L (ref 0–32)
AST: 18 IU/L (ref 0–40)
Albumin: 4.1 g/dL (ref 3.9–4.9)
Alkaline Phosphatase: 131 IU/L — ABNORMAL HIGH (ref 44–121)
BUN/Creatinine Ratio: 19 (ref 12–28)
BUN: 27 mg/dL (ref 8–27)
Bilirubin Total: 0.3 mg/dL (ref 0.0–1.2)
CO2: 21 mmol/L (ref 20–29)
Calcium: 9.7 mg/dL (ref 8.7–10.3)
Chloride: 102 mmol/L (ref 96–106)
Creatinine, Ser: 1.45 mg/dL — ABNORMAL HIGH (ref 0.57–1.00)
Globulin, Total: 2.9 g/dL (ref 1.5–4.5)
Glucose: 177 mg/dL — ABNORMAL HIGH (ref 70–99)
Potassium: 5.2 mmol/L (ref 3.5–5.2)
Sodium: 139 mmol/L (ref 134–144)
Total Protein: 7 g/dL (ref 6.0–8.5)
eGFR: 39 mL/min/{1.73_m2} — ABNORMAL LOW (ref >59)

## 2023-07-07 LAB — LIPID PANEL
Chol/HDL Ratio: 2 ratio (ref 0.0–4.4)
Cholesterol, Total: 197 mg/dL (ref 100–199)
HDL: 99 mg/dL (ref >39)
LDL Chol Calc (NIH): 81 mg/dL (ref 0–99)
Triglycerides: 98 mg/dL (ref 0–149)
VLDL Cholesterol Cal: 17 mg/dL (ref 5–40)

## 2023-07-07 LAB — CBC
Hematocrit: 41.9 % (ref 34.0–46.6)
Hemoglobin: 13.9 g/dL (ref 11.1–15.9)
MCH: 28.5 pg (ref 26.6–33.0)
MCHC: 33.2 g/dL (ref 31.5–35.7)
MCV: 86 fL (ref 79–97)
Platelets: 248 10*3/uL (ref 150–450)
RBC: 4.87 x10E6/uL (ref 3.77–5.28)
RDW: 13.1 % (ref 11.7–15.4)
WBC: 4.6 10*3/uL (ref 3.4–10.8)

## 2023-07-07 LAB — HEMOGLOBIN A1C
Est. average glucose Bld gHb Est-mCnc: 192 mg/dL
Hgb A1c MFr Bld: 8.3 % — ABNORMAL HIGH (ref 4.8–5.6)

## 2023-07-07 LAB — VITAMIN B12: Vitamin B-12: 615 pg/mL (ref 232–1245)

## 2023-07-07 LAB — MAGNESIUM: Magnesium: 2.4 mg/dL — ABNORMAL HIGH (ref 1.6–2.3)

## 2023-07-11 ENCOUNTER — Encounter: Payer: Self-pay | Admitting: *Deleted

## 2023-07-12 NOTE — Progress Notes (Signed)
 [facility] [facility] Weekly Session  Patient Details  Name: Leslie Davis MRN: [id number] Date of Birth: [birth year] Age: 68 y.o. PCP: Cleave Curling, MD  Vitals:   07/11/23 1500  Weight: 217 lb (98.4 kg)     [facility] Weekly seesion - 07/11/23 1500       [facility] "PREP" Location   [facility]" Location [facility]      Weekly Session   Topic Discussed Calorie breakdown;Hitting roadblocks   Review macronutrients, discuss protein, maintaining motivation,discuss goals and activities sheet   Minutes exercised this week 75 minutes    Classes attended to date 3             Howard Macho 07/12/2023, 1:00 PM

## 2023-07-16 DIAGNOSIS — M791 Myalgia, unspecified site: Secondary | ICD-10-CM | POA: Insufficient documentation

## 2023-07-16 DIAGNOSIS — G8929 Other chronic pain: Secondary | ICD-10-CM | POA: Insufficient documentation

## 2023-07-16 DIAGNOSIS — Z859 Personal history of malignant neoplasm, unspecified: Secondary | ICD-10-CM | POA: Insufficient documentation

## 2023-07-16 NOTE — Assessment & Plan Note (Signed)
 Leg muscle spasms, possible electrolyte imbalance due to low fruit/veggie intake. - Check magnesium  levels. - Encourage two fruits per day. - Consider muscle relaxer if needed. - Encouraged to stay well hydrated.

## 2023-07-16 NOTE — Assessment & Plan Note (Signed)
 Chronic, s/p left TKR several years ago. Ortho has suggested surgical revision; however, she has to first reach A1c goal.  Chronic pain post-surgery. Discussed pain management and surgical risks related to blood sugar control. - Discuss pain management and surgical options. - Encourage weight and blood sugar control.

## 2023-07-16 NOTE — Assessment & Plan Note (Signed)
 She is s/p small bowel neuroendocrine tumor resection about a year ago. Recent imaging shows no evidence of recurrent disease.

## 2023-07-16 NOTE — Assessment & Plan Note (Signed)
She is encouraged to strive for BMi less than 30 to decrease cardiac risk. Advised to aim for at least 150 minutes of exercise per week. She is encouraged to return to working out at the gym - aiming for at least 150 minutes of exercise per week.

## 2023-07-16 NOTE — Assessment & Plan Note (Signed)
 Chronic, diabetic foot exam was performed.  LDL goal is less than 70.  She will continue with ezetimibe  10mg  and rosuvastatin  daily. She is now followed by Endo for diabetes. She reports compliance with meds. I DISCUSSED WITH THE PATIENT AT LENGTH REGARDING THE GOALS OF GLYCEMIC CONTROL AND POSSIBLE LONG-TERM COMPLICATIONS.  I  ALSO STRESSED THE IMPORTANCE OF COMPLIANCE WITH HOME GLUCOSE MONITORING, DIETARY RESTRICTIONS INCLUDING AVOIDANCE OF SUGARY DRINKS/PROCESSED FOODS,  ALONG WITH REGULAR EXERCISE.  I  ALSO STRESSED THE IMPORTANCE OF ANNUAL EYE EXAMS, SELF FOOT CARE AND COMPLIANCE WITH OFFICE VISITS.

## 2023-07-16 NOTE — Assessment & Plan Note (Addendum)
 Chronic, fair control. Goal BP<120/80. EKG performed, marked sinus Bradycardia  W/ negative T-waves, aterolateral ischemia. She is without cp and sob today.  She will c/w amlodipine  10mg , carvedilol  3.125mg  twice daily and Edarbi  80mg  daily.  She is encouraged to follow low sodium diet, make necessary dietary changes and increase daily activity. I will see her in six months.

## 2023-07-16 NOTE — Assessment & Plan Note (Addendum)
 Chronic, diabetic foot exam was performed.  Elevated blood sugars at 170 mg/dL. Difficulty with dietary adherence. Discussed Wellbutrin for cravings. - Adjust Tresiba  dosage. - Discuss dietary modifications. - Consider Wellbutrin for cravings. - Ensure follow-up with diabetes specialist. - Importance of dietary/medication compliance was discussed with the patient.

## 2023-07-16 NOTE — Assessment & Plan Note (Signed)

## 2023-07-16 NOTE — Assessment & Plan Note (Signed)
 Chronic, LDL goal is less than 70. She will continue with ASA 81mg  daily, carvedilol  3.125mg  twice daily and rosuvastatin  20mg  daily. She is encouraged to follow a heart healthy lifestyle.

## 2023-07-18 ENCOUNTER — Encounter: Payer: Self-pay | Admitting: *Deleted

## 2023-07-18 NOTE — Progress Notes (Signed)
 [facility] [facility] Weekly Session  Patient Details  Name: Leslie Davis MRN: [id number] Date of Birth: 04-23-1955 Age: 68 y.o. PCP: Cleave Curling, MD  Vitals:   07/18/23 1430  Weight: 218 lb (98.9 kg)     [facility] Weekly seesion - 07/18/23 1500       [facility] "PREP" Location   [facility] [employer] [facility]      Weekly Session   Topic Discussed Other   Final FIT tesing, Fit ans Strong survey, Questions and Anwers   Minutes exercised this week 80 minutes    Classes attended to date 21             Howard Macho 07/18/2023, 8:54 PM

## 2023-07-25 ENCOUNTER — Ambulatory Visit (INDEPENDENT_AMBULATORY_CARE_PROVIDER_SITE_OTHER): Payer: 59 | Admitting: Family Medicine

## 2023-07-25 ENCOUNTER — Encounter: Payer: Self-pay | Admitting: *Deleted

## 2023-07-26 NOTE — Progress Notes (Signed)
 [facility]  Patient Details  Name: Leslie Davis MRN: [id number] Date of Birth: [birth year] Age: 68 y.o. PCP: Cleave Curling, MD  Vitals:   07/25/23 1045  BP: (!) 140/62  Pulse: (!) 55  Resp: 20  SpO2: 98%  Weight: 220 lb 9.6 oz (100.1 kg)  Height: 5\' 4"  (1.626 m)     [facility] Eval - 07/25/23 1045       [facility] "PREP" Location   [facility] Location [facility]      Referral    Referring Provider Opalski    Reason for referral Diabetes;High Cholesterol;Hypertension;Inactivity;Obesitity/Overweight    Program Start Date 05/02/23    Program End Date 07/25/23      Measurement   Waist Circumference 45.75 inches    Waist Circumference End Program 46 inches    Hip Circumference 53 inches    Hip Circumference End Program 53 inches    Body fat 47.6 percent      Mobility and Daily Activities   I find it easy to walk up or down two or more flights of stairs. 2    I have no trouble taking out the trash. 2    I do housework such as vacuuming and dusting on my own without difficulty. 3    I can easily lift a gallon of milk (8lbs). 4    I can easily walk a mile. 1    I have no trouble reaching into high cupboards or reaching down to pick up something from the floor. 3    I do not have trouble doing out-door work such as Loss adjuster, chartered, raking leaves, or gardening. 2      Mobility and Daily Activities   I feel younger than my age. 2    I feel independent. 4    I feel energetic. 2    I live an active life.  2    I feel strong. 3    I feel healthy. 2    I feel active as other people my age. 3      How fit and strong are you.   Fit and Strong Total Score 35            Past Medical History:  Diagnosis Date   CKD (chronic kidney disease)    stage 3   Coronary artery disease 07/2021   obstructive CAD-70% LAD-treated medically   DM (diabetes mellitus) (HCC)    Fatty liver disease, nonalcoholic    Gastritis    Gastroparesis    GERD (gastroesophageal reflux  disease)    Heartburn    Hiatal hernia    HLD (hyperlipidemia)    HTN (hypertension)    Joint pain    Renal calculus    Rheumatoid arthritis (HCC)    Vitamin D  deficiency    Past Surgical History:  Procedure Laterality Date   BREAST BIOPSY  02/04/2022   MM LT RADIOACTIVE SEED EA ADD LESION LOC MAMMO GUIDE 02/04/2022 [facility]   BREAST BIOPSY  02/04/2022   MM LT RADIOACTIVE SEED LOC MAMMO GUIDE 02/04/2022 [facility]   BREAST LUMPECTOMY WITH RADIOACTIVE SEED LOCALIZATION Left 02/05/2022   Procedure: LEFT BREAST BRACKETED LUMPECTOMY WITH RADIOACTIVE SEED LOCALIZATION;  Surgeon: Caralyn Chandler, MD;  Location: [facility];  Service: General;  Laterality: Left;   CHOLECYSTECTOMY     COLONOSCOPY  09/10/2011   normal   JOINT REPLACEMENT  2022   left TKA   LAPAROSCOPIC REMOVAL ABDOMINAL MASS N/A 08/05/2022  Procedure: LAPAROSCOPIC ASSISTED EXCISION OF MESENTERIC MASS;  Surgeon: Lujean Sake, MD;  Location: [facility] OR;  Service: General;  Laterality: N/A;   LAPAROSCOPIC SMALL BOWEL RESECTION N/A 08/05/2022   Procedure: LAPAROSCOPIC SMALL BOWEL RESECTION;  Surgeon: Lujean Sake, MD;  Location: [facility];  Service: General;  Laterality: N/A;   LEFT HEART CATH AND CORONARY ANGIOGRAPHY N/A 08/18/2021   Procedure: LEFT HEART CATH AND CORONARY ANGIOGRAPHY;  Surgeon: Cody Das, MD;  [facility]: [facility];  Service: Cardiovascular;  Laterality: N/A;   TOTAL KNEE ARTHROPLASTY Left 10/27/2020   Procedure: LEFT TOTAL KNEE ARTHROPLASTY;  Surgeon: Wes Hamman, MD;  Location: [facility] OR;  Service: Orthopedics;  Laterality: Left;   TUBAL LIGATION     UPPER GASTROINTESTINAL ENDOSCOPY     Social History   Tobacco Use  Smoking Status Former   Current packs/day: 0.00   Average packs/day: 0.3 packs/day for 1 year (0.3 ttl pk-yrs)   Types: Cigarettes   Start date: 09/09/1973   Quit date: 09/10/1974   Years since quitting: 48.9  Smokeless Tobacco Never  Tobacco  Comments   she no longer smokes.     Howard Macho 07/26/2023, 8:18 AM  PREP Program complete. Attended 10 of 11 education classes and 12 of 12 exercise sessions. Significant improvement in FIT testing, improving 2 minute cardio march from 155 steps to 245 in 12 weeks. Leslie Davis was very engaged in class. She has a solid plan in place to continue her exercise program. Her left knee pain continues to a barrier. We discussed continuing to work on healthy eating and sustainable weight loss as well as light weight leg strengthening exercises.  Keep up the good work [employer]!

## 2023-08-02 ENCOUNTER — Ambulatory Visit (INDEPENDENT_AMBULATORY_CARE_PROVIDER_SITE_OTHER): Admitting: Family Medicine

## 2023-08-02 ENCOUNTER — Encounter (INDEPENDENT_AMBULATORY_CARE_PROVIDER_SITE_OTHER): Payer: Self-pay | Admitting: Family Medicine

## 2023-08-02 VITALS — BP 168/75 | HR 49 | Temp 97.6°F | Ht 64.0 in | Wt 215.0 lb

## 2023-08-02 DIAGNOSIS — Z7984 Long term (current) use of oral hypoglycemic drugs: Secondary | ICD-10-CM

## 2023-08-02 DIAGNOSIS — E1169 Type 2 diabetes mellitus with other specified complication: Secondary | ICD-10-CM

## 2023-08-02 DIAGNOSIS — E669 Obesity, unspecified: Secondary | ICD-10-CM

## 2023-08-02 DIAGNOSIS — R638 Other symptoms and signs concerning food and fluid intake: Secondary | ICD-10-CM

## 2023-08-02 DIAGNOSIS — Z794 Long term (current) use of insulin: Secondary | ICD-10-CM | POA: Diagnosis not present

## 2023-08-02 DIAGNOSIS — Z6836 Body mass index (BMI) 36.0-36.9, adult: Secondary | ICD-10-CM | POA: Diagnosis not present

## 2023-08-02 MED ORDER — TOPIRAMATE 25 MG PO TABS: ORAL_TABLET | ORAL | 1 refills | Status: DC

## 2023-08-02 NOTE — Progress Notes (Signed)
 Leslie Davis, D.O.  ABFM, ABOM Specializing in Clinical Bariatric Medicine  Office located at: [address] [place], [place]  [address]   Assessment and Plan:   There are no discontinued medications.   Meds ordered this encounter  Medications   topiramate  (TOPAMAX ) 25 MG tablet    Sig: 1/2 tab bid    Dispense:  30 tablet    Refill:  1     FOR THE DISEASE OF OBESITY: BMI 36.0-36.9,adult -- Current BMI 36.89 Generalized obesity: Starting BMI 35.6 Assessment & Plan: Since last office visit on 05/02/23 patient's muscle mass has increased by 3.2 lbs. Fat mass has increased by 0.8 lbs. Total body water  has increased by 2 lbs.  Counseling done on how various foods will affect these numbers and how to maximize success  Total lbs lost to date: -7 lbs (gained 7 lbs from starting weight) Total weight loss percentage to date: 3.37%    Recommended Dietary Goals Leslie Davis is currently in the action stage of change. As such, her goal is to continue weight management plan.  She has agreed to: follow the Category 1 plan with B/L options and add journaling adhering to 100 calories and 75+ g of protein daily.    Behavioral Intervention We discussed the following today: increasing lean protein intake to established goals, decreasing simple carbohydrates , reading food labels , practice mindfulness eating and understand the difference between hunger signals and cravings, avoiding temptations and identifying enticing environmental cues, and focusing on food with a 10:1 ratio of calories: grams of protein  Additional resources provided today: Handout on CAT 1-2 breakfast options and Handout on CAT 1-2 lunch options  Evidence-based interventions for health behavior change were utilized today including the discussion of self monitoring techniques, problem-solving barriers and SMART goal setting techniques.   Regarding patient's less desirable eating habits and patterns, we employed the  technique of small changes.   Pt will specifically work on: increase activity aqua aerobics and start journaling her intake  for next visit.    Recommended Physical Activity Goals Leslie Davis has been advised to work up to 300-450 minutes of moderate intensity aerobic activity a week and strengthening exercises 2-3 times per week for cardiovascular health, weight loss maintenance and preservation of muscle mass.   She has agreed to :  Increase physical activity in their day and reduce sedentary time (increase NEAT). and Increase activity with aqua aerobics   Pharmacotherapy We both agreed to: continue with nutritional and behavioral strategies and Start Topiramate  12.5 mg BID.    ASSOCIATED CONDITIONS ADDRESSED TODAY: Type 2 diabetes mellitus with obesity Leslie Davis) Assessment & Plan: Lab Results  Component Value Date   HGBA1C 8.3 (H) 07/06/2023   HGBA1C 10.4 (H) 04/07/2023   HGBA1C 8.9 (H) 01/04/2023   A1c improved from 10.4 in 04/07/23 to 8.3 in 07/06/23. Pt on Humalog  6 units in the morning, 8 units in the afternoon, and 10 units at night. She also takes Tresiba  24 units at nighttime and Farxiga  10 mg once daily. Last followed up with Dr. Ronelle Coffee of endocrinology on 04/15/23. Of note, pt did not tolerate well Metformin  well in the past; reports GI side effects such as diarrhea and fecal urgency.   Encouraged pt to journal daily calorie/protein intake to increase mindfulness. Decrease simple carbs/sugars and increase protein intake. Reviewed how protein helps to stabilize sugars. Continue with current regimen as prescribed. Encouraged pt to continue following up with endo care team as instructed by them.  Abnormal craving Assessment & Plan: Cravings are uncontrolled. Pt feels she is eating on-plan and has a desire to be on medication to help with this. She is unable to take injectables due to contraindications including a hx of neuroendocrine tumors. Failed Metformin  the past with intolerances  including diarrhea and fecal urgency. Currently drinks 3-4 bottles of water  daily.   Reviewed benefits and potential risks/side effects of Topiramate ; pt verbalized understanding. Pt is to start on a half tab Topiramate  12.5 mg BID. Stressed the importance of eating on-plan, meeting her protein intake goals, and increasing overall daily activity. Increase water  intake to half her body weight in ounces of water  per day.   Orders: - Start Topiramate  12.5 mg (half tabs) BID.    Follow up:   Return in about 4 weeks (around 08/30/2023) for 4 weeks for f/u; started new medication. Aaron Aas  She was informed of the importance of frequent follow up visits to maximize her success with intensive lifestyle modifications for her multiple health conditions.  Subjective:   Chief complaint: Obesity Leslie Davis is here to discuss her progress with her obesity treatment plan. She is on the Category 1 Plan with B/L options and states she is following her eating plan approximately 50% of the time. 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  Interval History:  Leslie Davis is here for a follow up office visit. Since last OV on 05/02/23, she has gained 4 lbs. She endorses sweet cravings after dinner time. For dinner last night, she had a cup of whole wheat pasta with shrimp, broccoli, and a small amount of parmesan cheese. She adds that she had more shrimp than pasta.   Pharmacotherapy for weight loss: She is currently taking no anti-obesity medication.   Review of Systems:  Pertinent positives were addressed with patient today.  Reviewed by clinician on day of visit: allergies, medications, problem list, medical history, surgical history, family history, social history, and previous encounter notes.  Weight Summary and Biometrics   Weight Lost Since Last Visit: 0  Weight Gained Since Last Visit: 4lb    Vitals Temp: 97.6 F (36.4 C) BP: (!) 168/75 Pulse  Rate: (!) 49 SpO2: 98 %   Anthropometric Measurements Height: 5\' 4"  (1.626 m) Weight: 215 lb (97.5 kg) BMI (Calculated): 36.89 Weight at Last Visit: 211lb Weight Lost Since Last Visit: 0 Weight Gained Since Last Visit: 4lb Starting Weight: 208lb Total Weight Loss (lbs): 0 lb (0 kg)   Body Composition  Body Fat %: 47.8 % Fat Mass (lbs): 103.2 lbs Muscle Mass (lbs): 106.8 lbs Total Body Water  (lbs): 76.6 lbs Visceral Fat Rating : 15   Other Clinical Data Fasting: no Labs: no Today's Visit #: 27 Starting Date: 07/23/20    Objective:   PHYSICAL EXAM: Blood pressure (!) 168/75, pulse (!) 49, temperature 97.6 F (36.4 C), height 5\' 4"  (1.626 m), weight 215 lb (97.5 kg), SpO2 98%. Body mass index is 36.9 kg/m.  General: she is overweight, cooperative and in no acute distress. PSYCH: Has normal mood, affect and thought process.   HEENT: EOMI, sclerae are anicteric. Lungs: Normal breathing effort, no conversational dyspnea. Extremities: Moves * 4 Neurologic: A and O * 3, good insight  DIAGNOSTIC DATA REVIEWED: BMET    Component Value Date/Time   NA 139 07/06/2023 1124   K 5.2 07/06/2023 1124   CL 102 07/06/2023 1124   CO2 21 07/06/2023 1124   GLUCOSE  177 (H) 07/06/2023 1124   GLUCOSE 235 (H) 05/08/2023 2123   BUN 27 07/06/2023 1124   CREATININE 1.45 (H) 07/06/2023 1124   CREATININE 0.74 08/30/2011 1307   CALCIUM  9.7 07/06/2023 1124   GFRNONAA 57 (L) 05/08/2023 2123   GFRAA 57 (L) 02/21/2020 1500   Lab Results  Component Value Date   HGBA1C 8.3 (H) 07/06/2023   HGBA1C (H) 09/12/2007    9.4 (NOTE)   The ADA recommends the following therapeutic goal for glycemic   control related to Hgb A1C measurement:   Goal of Therapy:   < 7.0% Hgb A1C   Reference: American Diabetes Association: Clinical Practice   Recommendations 2008, Diabetes Care,  2008, 31:(Suppl 1).   No results found for: "INSULIN " Lab Results  Component Value Date   TSH 1.640 06/17/2021    CBC    Component Value Date/Time   WBC 4.6 07/06/2023 1124   WBC 4.7 05/08/2023 2123   RBC 4.87 07/06/2023 1124   RBC 4.26 05/08/2023 2123   HGB 13.9 07/06/2023 1124   HCT 41.9 07/06/2023 1124   PLT 248 07/06/2023 1124   MCV 86 07/06/2023 1124   MCH 28.5 07/06/2023 1124   MCH 28.4 05/08/2023 2123   MCHC 33.2 07/06/2023 1124   MCHC 31.8 05/08/2023 2123   RDW 13.1 07/06/2023 1124   Iron  Studies    Component Value Date/Time   IRON  68 11/16/2017 0747   TIBC 355 11/16/2017 0747   FERRITIN 45.2 11/16/2017 0747   IRONPCTSAT 19 11/16/2017 0747   Lipid Panel     Component Value Date/Time   CHOL 197 07/06/2023 1124   TRIG 98 07/06/2023 1124   HDL 99 07/06/2023 1124   CHOLHDL 2.0 07/06/2023 1124   CHOLHDL 2.8 09/13/2007 0405   VLDL 17 09/13/2007 0405   LDLCALC 81 07/06/2023 1124   Hepatic Function Panel     Component Value Date/Time   PROT 7.0 07/06/2023 1124   ALBUMIN 4.1 07/06/2023 1124   AST 18 07/06/2023 1124   ALT 17 07/06/2023 1124   ALKPHOS 131 (H) 07/06/2023 1124   BILITOT 0.3 07/06/2023 1124   BILIDIR 0.1 09/06/2019 2304   BILIDIR 0.09 11/08/2018 1017   IBILI 0.4 09/06/2019 2304      Component Value Date/Time   TSH 1.640 06/17/2021 1606   Nutritional Lab Results  Component Value Date   VD25OH 32.5 07/05/2022   VD25OH 48.3 09/14/2021   VD25OH 82.1 03/31/2021    Attestations:   I, Bernita Bristle, acting as a Stage manager for Marceil Sensor, DO., have compiled all relevant documentation for today's office visit on behalf of Marceil Sensor, DO, while in the presence of [employer], DO.  Reviewed by clinician on day of visit: allergies, medications, problem list, medical history, surgical history, family history, social history, and previous encounter notes pertinent to patient's obesity diagnosis.  I have spent 40 minutes total in the care of the patient today including 34 minutes spent in direct face-to-face counseling on various treatment  options for obesity, including risks and benefits and importance of nutritional label reading and exercise. This was in addition to the chronic disease management as noted above. This also includes preparing to see patient (e.g. review and interpretation of tests, old notes ), obtaining and/or reviewing separately obtained history, performing a medically appropriate examination or evaluation, counseling and educating the patient, ordering medications, test or procedures, documenting clinical information in the electronic or other health care record, and independently interpreting results and communicating results  to the patient, family, or caregiver.  I have reviewed the above documentation for accuracy and completeness, and I agree with the above. Leslie Davis, D.O.  The 21st Century Cures Act was signed into law in 2016 which includes the topic of electronic health records.  This provides immediate access to information in MyChart.  This includes consultation notes, operative notes, office notes, lab results and pathology reports.  If you have any questions about what you read please let us  know at your next visit so we can discuss your concerns and take corrective action if need be.  We are right here with you.

## 2023-08-03 ENCOUNTER — Telehealth: Payer: Self-pay | Admitting: Orthopaedic Surgery

## 2023-08-03 NOTE — Telephone Encounter (Signed)
 Called and notified patient.

## 2023-08-03 NOTE — Telephone Encounter (Signed)
 Spoke with patient. She will pick up handicap. She is currently taking tramadol  for pain and it does not help. She wants to know if you will prescribe something stronger for her to take?

## 2023-08-03 NOTE — Telephone Encounter (Signed)
5 year

## 2023-08-03 NOTE — Telephone Encounter (Signed)
 Patient called and needs a handicap sign. CB#[phone]

## 2023-08-03 NOTE — Telephone Encounter (Signed)
 Can't do stronger than tramadol 

## 2023-08-18 ENCOUNTER — Telehealth: Payer: Self-pay | Admitting: Internal Medicine

## 2023-08-18 NOTE — Telephone Encounter (Signed)
 Pt requested a new OneTouch Verio, stating their glucometer is not working. RN unable to pend for a refill, new orders needed.

## 2023-08-18 NOTE — Telephone Encounter (Unsigned)
 Copied from CRM [id number]. Topic: Clinical - Medication Refill >> Aug 18, 2023 10:13 AM Leslie Davis wrote: Medication: Blood Glucose Monitoring Suppl (ONETOUCH VERIO) w/Device KIT  Has the patient contacted their pharmacy? No  The monitor is not working  This is the patient's preferred pharmacy:  [facility] [place], [place] - [address] Leslie Davis [place] [address] Phone: [phone] Fax: [phone]    Is this the correct pharmacy for this prescription? Yes If no, delete pharmacy and type the correct one.   Has the prescription been filled recently? No  Is the patient out of the medication? Yes  Has the patient been seen for an appointment in the last year OR does the patient have an upcoming appointment? Yes  Can we respond through MyChart? Yes  Agent: Please be advised that Rx refills may take up to 3 business days. We ask that you follow-up with your pharmacy.

## 2023-08-19 ENCOUNTER — Other Ambulatory Visit: Payer: Self-pay | Admitting: Cardiology

## 2023-08-19 DIAGNOSIS — I2510 Atherosclerotic heart disease of native coronary artery without angina pectoris: Secondary | ICD-10-CM

## 2023-08-19 DIAGNOSIS — E1169 Type 2 diabetes mellitus with other specified complication: Secondary | ICD-10-CM

## 2023-08-23 ENCOUNTER — Ambulatory Visit: Payer: Self-pay

## 2023-08-23 ENCOUNTER — Other Ambulatory Visit: Payer: Self-pay | Admitting: Internal Medicine

## 2023-08-23 NOTE — Telephone Encounter (Signed)
 Copied from CRM [id number]. Topic: Clinical - Red Word Triage >> Aug 23, 2023 10:16 AM Antwanette L wrote: Red Word that prompted transfer to Nurse Triage: Patient has a malfunctioned glucose monitor. The patient check her blood sugar 3 weeks ago    Patient called and states that her glucose monitor has not been working.  It is in her left arm and she has not been able to check her blood sugar in three weeks. Patient states that she can tell when her blood sugar is up or down and she states that she feels fine at this time.  This RN called the CAL to check on the status of a new glucose monitor being sent in for her.  Patient states that the one in her arm now is the one that is supposed to stay in for 15 days. She replaces that with another one for 15 days.  She states the current one has been in her arm for about a week and three days.  Patient states that the monitor that tells her when her sugar is up or down is not working.  Nothing is showing up on the monitor.  Patient is advised that if anything changes to give us  a call back. She is also advised that if she starts to experience any symptoms or anything worsens to go to the Emergency Room. Patient verbalized understanding.   CAL advised that they will send in a new prescription for this. Patient wanted it to go to [facility] and this is where the last prescription for it went to. Reason for Disposition . [1] Caller requesting NON-URGENT health information AND [2] PCP's office is the best resource  Protocols used: Information Only Call - No Triage-A-AH

## 2023-08-25 ENCOUNTER — Other Ambulatory Visit: Payer: Self-pay | Admitting: Internal Medicine

## 2023-08-25 MED ORDER — FREESTYLE LIBRE 3 READER DEVI: 0 refills | Status: AC

## 2023-08-25 NOTE — Telephone Encounter (Signed)
 Copied from CRM [id number]. Topic: Clinical - Medication Refill >> Aug 25, 2023  9:25 AM Rosaria Common wrote: Medication: Continuous Glucose Receiver (FREESTYLE LIBRE 3 READER) DEVI  Has the patient contacted their pharmacy? Yes (Agent: If no, request that the patient contact the pharmacy for the refill. If patient does not wish to contact the pharmacy document the reason why and proceed with request.) (Agent: If yes, when and what did the pharmacy advise?)  This is the patient's preferred pharmacy:  [facility] [place], [place] - [address] [facility] [address] [place] [address] Phone: [phone] Fax: [phone]   Is this the correct pharmacy for this prescription? Yes If no, delete pharmacy and type the correct one.   Has the prescription been filled recently? No  Is the patient out of the medication? Yes  Has the patient been seen for an appointment in the last year OR does the patient have an upcoming appointment? Yes  Can we respond through MyChart? Yes  Agent: Please be advised that Rx refills may take up to 3 business days. We ask that you follow-up with your pharmacy.

## 2023-08-29 ENCOUNTER — Telehealth: Payer: Self-pay | Admitting: Orthopaedic Surgery

## 2023-08-29 NOTE — Telephone Encounter (Signed)
 Received vm from patient inquiring about medical records. IC spoke with patient advised will need to come to the office and complete auth for medical records.

## 2023-09-13 ENCOUNTER — Ambulatory Visit (INDEPENDENT_AMBULATORY_CARE_PROVIDER_SITE_OTHER): Admitting: Family Medicine

## 2023-09-14 DIAGNOSIS — I2510 Atherosclerotic heart disease of native coronary artery without angina pectoris: Secondary | ICD-10-CM | POA: Diagnosis not present

## 2023-09-14 DIAGNOSIS — E782 Mixed hyperlipidemia: Secondary | ICD-10-CM | POA: Diagnosis not present

## 2023-09-14 DIAGNOSIS — I10 Essential (primary) hypertension: Secondary | ICD-10-CM | POA: Diagnosis not present

## 2023-09-14 DIAGNOSIS — K3184 Gastroparesis: Secondary | ICD-10-CM | POA: Diagnosis not present

## 2023-09-14 DIAGNOSIS — E1165 Type 2 diabetes mellitus with hyperglycemia: Secondary | ICD-10-CM | POA: Diagnosis not present

## 2023-09-14 DIAGNOSIS — N183 Chronic kidney disease, stage 3 unspecified: Secondary | ICD-10-CM | POA: Diagnosis not present

## 2023-09-14 DIAGNOSIS — E1143 Type 2 diabetes mellitus with diabetic autonomic (poly)neuropathy: Secondary | ICD-10-CM | POA: Diagnosis not present

## 2023-09-19 ENCOUNTER — Other Ambulatory Visit: Payer: Self-pay | Admitting: Cardiology

## 2023-09-19 ENCOUNTER — Other Ambulatory Visit: Payer: Self-pay | Admitting: Nurse Practitioner

## 2023-09-19 DIAGNOSIS — E1169 Type 2 diabetes mellitus with other specified complication: Secondary | ICD-10-CM

## 2023-09-19 DIAGNOSIS — I2510 Atherosclerotic heart disease of native coronary artery without angina pectoris: Secondary | ICD-10-CM

## 2023-09-28 ENCOUNTER — Ambulatory Visit (INDEPENDENT_AMBULATORY_CARE_PROVIDER_SITE_OTHER): Admitting: Family Medicine

## 2023-10-13 ENCOUNTER — Other Ambulatory Visit (HOSPITAL_COMMUNITY): Payer: Self-pay

## 2023-10-13 ENCOUNTER — Ambulatory Visit: Attending: Cardiology | Admitting: Cardiology

## 2023-10-13 ENCOUNTER — Ambulatory Visit

## 2023-10-13 ENCOUNTER — Encounter: Payer: Self-pay | Admitting: Cardiology

## 2023-10-13 VITALS — BP 130/68 | HR 49 | Resp 16 | Ht 64.0 in | Wt 226.4 lb

## 2023-10-13 DIAGNOSIS — R001 Bradycardia, unspecified: Secondary | ICD-10-CM

## 2023-10-13 DIAGNOSIS — N1831 Chronic kidney disease, stage 3a: Secondary | ICD-10-CM

## 2023-10-13 DIAGNOSIS — E1122 Type 2 diabetes mellitus with diabetic chronic kidney disease: Secondary | ICD-10-CM

## 2023-10-13 DIAGNOSIS — I2584 Coronary atherosclerosis due to calcified coronary lesion: Secondary | ICD-10-CM | POA: Diagnosis not present

## 2023-10-13 DIAGNOSIS — I10 Essential (primary) hypertension: Secondary | ICD-10-CM

## 2023-10-13 DIAGNOSIS — I2510 Atherosclerotic heart disease of native coronary artery without angina pectoris: Secondary | ICD-10-CM

## 2023-10-13 DIAGNOSIS — I700 Atherosclerosis of aorta: Secondary | ICD-10-CM | POA: Diagnosis not present

## 2023-10-13 DIAGNOSIS — E785 Hyperlipidemia, unspecified: Secondary | ICD-10-CM | POA: Diagnosis not present

## 2023-10-13 DIAGNOSIS — E1169 Type 2 diabetes mellitus with other specified complication: Secondary | ICD-10-CM | POA: Diagnosis not present

## 2023-10-13 MED ORDER — ROSUVASTATIN CALCIUM 40 MG PO TABS
40.0000 mg | ORAL_TABLET | Freq: Every day | ORAL | 3 refills | Status: DC
  Filled 2023-10-13: qty 30, 30d supply, fill #0
  Filled 2023-11-30: qty 30, 30d supply, fill #1

## 2023-10-13 NOTE — Patient Instructions (Addendum)
 Medication Instructions:   STOP carvedilol    INCREASE Rosuvastatin  (Crestor ) 40 mg once daily.   *If you need a refill on your cardiac medications before your next appointment, please call your pharmacy*  Lab Work: FASTING lipid panel and CMP in 6 weeks (first week of September)  Testing/Procedures: Your physician has requested that you wear a Zio heart monitor for 7 days. This will be mailed to your home with instructions on how to apply the monitor and how to return it when finished. Please allow 2 weeks after returning the heart monitor before our office calls you with the results.   Follow-Up: At [facility], you and your health needs are our priority.  As part of our continuing mission to provide you with exceptional heart care, our providers are all part of one team.  This team includes your primary Cardiologist (physician) and Advanced Practice Providers or APPs (Physician Assistants and Nurse Practitioners) who all work together to provide you with the care you need, when you need it.  Your next appointment:   6 month(s)  Provider:   Madonna Large, DO    Other Instructions  IF YOU ARE EXPERIENCING ANY SHORTNESS OF BREATH, CHEST PAIN, OR CHANGE IN ENDURANCE. PLEASE GO TO THE EMERGENCY ROOM  ZIO XT- Long Term Monitor Instructions Your physician has requested you wear a ZIO patch monitor for 7 days.  This is a single patch monitor. [employer] supplies one patch monitor per enrollment. Additional stickers are not available. Please do not apply patch if you will be having a Nuclear Stress Test, Echocardiogram, Cardiac CT, MRI, or Chest Xray during the period you would be wearing the monitor. The patch cannot be worn during these tests. You cannot remove and re-apply the ZIO XT patch monitor.  Your ZIO patch monitor will be mailed 3 day USPS to your address on file. It may take 3-5 days to receive your monitor after you have been enrolled.  Once you have received your  monitor, please review the enclosed instructions. Your monitor has already been registered assigning a specific monitor serial # to you.    Billing and Patient Assistance Program Information We have supplied [employer] with any of your insurance information on file for billing purposes. [employer] offers a sliding scale Patient Assistance Program for patients that do not have insurance, or whose insurance does not completely cover the cost of the ZIO monitor. You must apply for the Patient Assistance Program to qualify for this discounted rate.  To apply, please call [employer] at [phone], select option 4, select option 2, ask to apply for Patient Assistance Program. Meredeth will ask your household income, and how many people are in your household. They will quote your out-of-pocket cost based on that information. [employer] will also be able to set up a 57-month, interest-free payment plan if needed.    Applying the monitor  Shave hair from upper left chest.  Hold abrader disc by orange tab. Rub abrader in 40 strokes over the upper left chest as  indicated in your monitor instructions.  Clean area with 4 enclosed alcohol pads. Let dry.  Apply patch as indicated in monitor instructions. Patch will be placed under collarbone on left  side of chest with arrow pointing upward.  Rub patch adhesive wings for 2 minutes. Remove white label marked 1. Remove the white  label marked 2. Rub patch adhesive wings for 2 additional minutes.  While looking in a mirror, press and release button in center of patch.  A small green light will  flash 3-4 times. This will be your only indicator that the monitor has been turned on.  Do not shower for the first 24 hours. You may shower after the first 24 hours.  Press the button if you feel a symptom. You will hear a small click. Record Date, Time and  Symptom in the Patient Logbook.  When you are ready to remove the patch, follow instructions on the  last 2 pages of Patient Logbook. Stick patch monitor onto the last page of Patient Logbook.  Place Patient Logbook in the blue and white box. Use locking tab on box and tape box closed securely. The blue and white box has prepaid postage on it. Please place it in the mailbox as soon as possible. Your physician should have your test results approximately 7 days after the monitor has been mailed back to [facility].  Call [facility] Customer Care at [phone] if you have questions regarding your ZIO XT patch monitor. Call them immediately if you see an orange light blinking on your monitor.  If your monitor falls off in less than 4 days, contact our Monitor department at [phone].  If your monitor becomes loose or falls off after 4 days call [employer] at [phone] for suggestions on securing your monitor.

## 2023-10-13 NOTE — Progress Notes (Unsigned)
 Enrolled patient a 14 day ZIo XT monitor to be mailed to patients home

## 2023-10-13 NOTE — Progress Notes (Signed)
 Cardiology Office Note:  .   Date:  10/13/2023  ID:  Leslie Davis, DOB 1955/10/23, MRN [id number] PCP:  Jarold Medici, MD  Former Cardiology Providers: Dr. Lavona [facility] Providers Cardiologist:  Madonna Large, DO , [facility] (established care 07/09/2021) Electrophysiologist:  None  Click to update primary MD,subspecialty MD or APP then REFRESH:1}    Chief Complaint  Patient presents with   Coronary atherosclerosis due to calcified coronary lesion   Follow-up    History of Present Illness: .   Leslie Davis is a 68 y.o. African-American female whose past medical history and cardiovascular risk factors includes: CAD, Coronary artery calcification, aortic atherosclerosis, hypertension, hyperlipidemia, GERD, fatty liver disease, DM Type II, nephrolithiasis, Removal of mesenteric mass, pathology notes neuroendocrine tumor, advanced age.   Referred to the practice back in 2023 for evaluation of shortness of breath and tightness.  Underwent ischemic workup which noted disease in the LAD distribution.  She underwent left heart catheterization which illustrated similar findings.  However with uptitration of antianginal therapy she was asymptomatic and the shared decision was to treat her medically.  Patient presents today for 1 year follow-up visit.  Denies anginal chest pain or heart failure symptoms.  She does aerobic exercises twice a week for 45 minutes and has not experienced change in physical endurance or exertional chest pain.  No hospitalizations or urgent care visits for cardiovascular reasons since last office encounter.  Patient states at home her blood pressures are well-controlled with pulse is usually mid 40s-high 50 bpm though asymptomatic.   Review of Systems: .   Review of Systems  Cardiovascular:  Negative for chest pain, claudication, irregular heartbeat, leg swelling, near-syncope, orthopnea, palpitations, paroxysmal nocturnal dyspnea and syncope.   Respiratory:  Negative for shortness of breath.   Hematologic/Lymphatic: Negative for bleeding problem.    Studies Reviewed:   EKG: EKG Interpretation Date/Time:  Thursday October 13 2023 08:18:16 EDT Ventricular Rate:  47 PR Interval:  158 QRS Duration:  82 QT Interval:  440 QTC Calculation: 389 R Axis:   63  Text Interpretation: Sinus bradycardia ST & T wave abnormality, consider inferolateral ischemia When compared with ECG of 18-Aug-2021 13:10, T wave inversion now evident in Inferior leads T wave inversion now evident in Anterolateral leads Confirmed by Large Madonna [phone]) on 10/13/2023 8:31:43 AM  Echocardiogram: 07/15/2021:  Normal LV systolic function with EF 63%. Left ventricle cavity is normal  in size. Mild concentric remodeling of the left ventricle. Normal global  wall motion. Normal diastolic filling pattern. Calculated EF 63%.  Left atrial cavity is mildly dilated by volume.  Structurally normal tricuspid valve.  Mild tricuspid regurgitation. No  evidence of pulmonary hypertension.   Stress Testing: 11/03/2018 [facility] available in Care Everywhere: Per report: No reversible ischemia or infarction, normal wall motion, calculated LVEF 84%, low risk study   CCTA: 07/22/2021 1. Total coronary calcium  score of 341. This was 93rd percentile for age and sex matched control. 2. Normal coronary origin with co-dominance. 3. CAD-RADS = 3.   Left Main: Patent.   LAD: Mild stenosis (25-49%) at the proximal LAD due to calcified plaque. Moderate stenosis (50-69%) at mid LAD after the take off of second diagonal branch due to mixed eccentric plaque (lesion is about 2 cm in length). Mild stenosis (25-49%) due to calcified plaque within the distal and apical LAD. First Diagonal branch is patent. Second diagonal branch patent with calcified plaque at the mid-segment.  LCx: Patent.   RCA: Moderate stenosis (50-69%) at the ostial RCA due to  eccentric mixed plaque and remainder of the vessel is patent.   4. Aortic atherosclerosis.   5. Study is sent for CT-FFR to further evaluate the RCA and LAD disease. Findings Leslie be performed and reported separately.   6. No acute or significant incidental extracardiac findings in the chest.   CTFFR  May 2023: CT FFR analysis illustrates hemodynamically significant stenosis within the mid LAD (distal to second diagonal branch).   Heart Catheterization: 08/18/2021: LM: Normal LAD: Prox calcific 30% disease         Mid, focal, eccentric 70% stenosis Lcx: No significant disease RCA: Ostial 40% stenosis Patient opted medical therapy. In absence of any angina or angina equivalent symptoms on excellent medical therapy, reasonable to continue the same at that time.  In future, if symptoms increase, could consider PCI to mid LAD.  RADIOLOGY: CTA PE protocol: June 25, 2021 1. No pulmonary embolus or acute intrathoracic abnormality. 2. Coronary artery calcifications. Aortic Atherosclerosis (ICD10-I70.0)  Risk Assessment/Calculations:   NA   Labs:       Latest Ref Rng & Units 07/06/2023   11:24 AM 05/08/2023    9:23 PM 01/04/2023   10:31 AM  CBC  WBC 3.4 - 10.8 x10E3/uL 4.6  4.7  4.8   Hemoglobin 11.1 - 15.9 g/dL 86.0  87.8  86.6   Hematocrit 34.0 - 46.6 % 41.9  38.1  41.4   Platelets 150 - 450 x10E3/uL 248  193  237        Latest Ref Rng & Units 07/06/2023   11:24 AM 05/08/2023    9:23 PM 04/07/2023   11:36 AM  BMP  Glucose 70 - 99 mg/dL 822  764  866   BUN 8 - 27 mg/dL 27  25  22    Creatinine 0.57 - 1.00 mg/dL 8.54  8.93  8.88   BUN/Creat Ratio 12 - 28 19   20    Sodium 134 - 144 mmol/L 139  134  141   Potassium 3.5 - 5.2 mmol/L 5.2  4.0  4.9   Chloride 96 - 106 mmol/L 102  108  105   CO2 20 - 29 mmol/L 21  20  23    Calcium  8.7 - 10.3 mg/dL 9.7  8.2  9.1       Latest Ref Rng & Units 07/06/2023   11:24 AM 05/08/2023    9:23 PM 04/07/2023   11:36 AM  CMP  Glucose 70 -  99 mg/dL 822  764  866   BUN 8 - 27 mg/dL 27  25  22    Creatinine 0.57 - 1.00 mg/dL 8.54  8.93  8.88   Sodium 134 - 144 mmol/L 139  134  141   Potassium 3.5 - 5.2 mmol/L 5.2  4.0  4.9   Chloride 96 - 106 mmol/L 102  108  105   CO2 20 - 29 mmol/L 21  20  23    Calcium  8.7 - 10.3 mg/dL 9.7  8.2  9.1   Total Protein 6.0 - 8.5 g/dL 7.0  6.8    Total Bilirubin 0.0 - 1.2 mg/dL 0.3  0.5    Alkaline Phos 44 - 121 IU/L 131  75    AST 0 - 40 IU/L 18  16    ALT 0 - 32 IU/L 17  15      Lab Results  Component Value Date   CHOL 197 07/06/2023  HDL 99 07/06/2023   LDLCALC 81 07/06/2023   TRIG 98 07/06/2023   CHOLHDL 2.0 07/06/2023   No results for input(s): LIPOA in the last 8760 hours. No components found for: NTPROBNP No results for input(s): PROBNP in the last 8760 hours. No results for input(s): TSH in the last 8760 hours.  Physical Exam:    Today's Vitals   10/13/23 0814  BP: 130/68  Pulse: (!) 49  Resp: 16  SpO2: 96%  Weight: 226 lb 6.4 oz (102.7 kg)  Height: 5' 4 (1.626 m)   Body mass index is 38.86 kg/m. Wt Readings from Last 3 Encounters:  10/13/23 226 lb 6.4 oz (102.7 kg)  08/02/23 215 lb (97.5 kg)  07/25/23 220 lb 9.6 oz (100.1 kg)    Physical Exam  Neck: No JVD present.  Cardiovascular: Normal rate, regular rhythm, S1 normal, S2 normal, intact distal pulses and normal pulses. Exam reveals no gallop, no S3 and no S4.  No murmur heard. Pulses:      Dorsalis pedis pulses are 2+ on the right side and 2+ on the left side.       Posterior tibial pulses are 2+ on the right side and 2+ on the left side.  Pulmonary/Chest: Effort normal and breath sounds normal. No stridor. She has no wheezes. She has no rales. She exhibits no tenderness.  Abdominal: Soft. Bowel sounds are normal. She exhibits no distension. There is no abdominal tenderness.  Musculoskeletal:        General: No edema.     Impression & Recommendation(s):  Impression:   ICD-10-CM   1. Coronary  atherosclerosis due to calcified coronary lesion  I25.10 EKG 12-Lead   I25.84     2. Bradycardia  R00.1     3. Atherosclerosis of aorta (HCC)  I70.0     4. Benign hypertension  I10     5. Type 2 diabetes mellitus with stage 3a chronic kidney disease, without long-term current use of insulin  (HCC)  E11.22    N18.31     6. Type 2 diabetes mellitus with hyperlipidemia (HCC)  E11.69    E78.5        Recommendation(s):  Coronary atherosclerosis due to calcified coronary lesion LAD disease has been treated with medical therapy since May 2023. Total CAC 341, 93rd percentile Denies anginal chest pain. No use of sublingual nitroglycerin  tablets since the last office visit. EKG illustrates sinus bradycardia with ST-T changes in the inferior lateral leads. Last heart catheterization noted to have disease in the LAD distribution recommended conservative management as she was asymptomatic.  Despite EKG changes she is asymptomatic and no change in physical endurance. Leslie focus on up titration of GDMT and improving risk factors. Given her bradycardia we Leslie discontinue carvedilol  for now and reconsider beta-blockers based on her cardiac monitor results Patient is asked to seek medical attention if she has new onset of chest pain, heart failure symptoms, or change in physical endurance.  She verbalized understanding  Atherosclerosis of aorta (HCC) Continue antiplatelets and lipid-lowering agents  Bradycardia Asymptomatic. Discontinue carvedilol  3.125 mg p.o. twice daily. A week later we Leslie order a 7-day Zio patch to evaluate for underlying rhythm and bradycardia burden.  If the average heart rate is well-controlled may consider low-dose Toprol -XL as part of her antianginal therapy Monitor for now.  Benign hypertension Office blood pressures are well-controlled. Continue amlodipine  10 mg p.o. daily. Continue Edarbi  80 mg p.o. daily. Continue Farxiga  10 mg p.o. daily  Type 2 diabetes  mellitus with stage 3a chronic kidney disease, without long-term current use of insulin  (HCC) Reemphasized importance of glycemic control. Last hemoglobin A1c 8.26 August 2023 Currently on ARB, statin therapy, Farxiga   Type 2 diabetes mellitus with hyperlipidemia (HCC) Currently on Crestor  20 mg p.o. daily and Zetia  10 mg p.o. daily Labs from April 2025 independently reviewed via [facility] database.  LDL 81 mg/dL Increase Crestor  to 40 mg p.o. nightly with a goal of LDL <55 mg/dL in the setting of insulin -dependent diabetes and CAD Check fasting lipids and CMP in 6 weeks   Orders Placed:  Orders Placed This Encounter  Procedures   EKG 12-Lead     Final Medication List:   No orders of the defined types were placed in this encounter.   Medications Discontinued During This Encounter  Medication Reason   carvedilol  (COREG ) 3.125 MG tablet Discontinued by provider     Current Outpatient Medications:    acetaminophen  (TYLENOL ) 500 MG tablet, Take 1 tablet (500 mg total) by mouth every 6 (six) hours as needed for mild pain., Disp: 30 tablet, Rfl: 0   ALLERGY RELIEF CETIRIZINE  10 MG tablet, TAKE 1 TABLET BY MOUTH ONCE DAILY, Disp: 30 tablet, Rfl: 10   amLODipine  (NORVASC ) 10 MG tablet, TAKE 1 TABLET BY MOUTH ONCE DAILY, Disp: 330 tablet, Rfl: 11   ASPIRIN  LOW DOSE 81 MG tablet, TAKE 1 TABLET BY MOUTH ONCE DAILY *SWALLOW WHOLE*, Disp: 30 tablet, Rfl: 10   Blood Glucose Monitoring Suppl (ONETOUCH VERIO) w/Device KIT, Use as directed to check blood sugars 2 times per day dx: e11.65, Disp: 1 kit, Rfl: 1   celecoxib  (CELEBREX ) 200 MG capsule, Take 1 capsule (200 mg total) by mouth 2 (two) times daily as needed., Disp: 180 capsule, Rfl: 2   clindamycin (CLEOCIN T) 1 % lotion, Apply 1 Application topically daily as needed (acne)., Disp: , Rfl:    Continuous Glucose Receiver (FREESTYLE LIBRE 3 READER) DEVI, Use to check glucose continuously, Disp: 1 each, Rfl: 0   Continuous Glucose Sensor (FREESTYLE  LIBRE 3 PLUS SENSOR) MISC, Change sensor every 15 days., Disp: 6 each, Rfl: 1   EDARBI  80 MG TABS, TAKE 1 TABLET BY MOUTH ONCE DAILY, Disp: 30 tablet, Rfl: 10   ezetimibe  (ZETIA ) 10 MG tablet, TAKE 1 TABLET BY MOUTH ONCE DAILY *PATIENT MUST SCHEDULE AN OVERDUE FOLLOW UP APPOINTMENT WITH CARDIOLOGY FOR ANY MORE REFILLS [phone]*, Disp: 15 tablet, Rfl: 0   FARXIGA  10 MG TABS tablet, TAKE 1 TABLET BY MOUTH DAILY BEFORE BREAKFAST, Disp: 30 tablet, Rfl: 10   ferrous gluconate  (FERGON) 324 MG tablet, TAKE 1 TABLET BY MOUTH TWICE DAILY WITH A MEAL., Disp: 100 tablet, Rfl: 0   glucose blood (ONETOUCH VERIO) test strip, USE TO TEST BLOOD SUGAR TWICE DAILY AS DIRECTED, Disp: 100 each, Rfl: 10   insulin  lispro (HUMALOG ) 100 UNIT/ML KwikPen, Inject 0-6 Units into the skin 3 (three) times daily with meals. Inject up to 3 times daily with meals per sliding scale (70-150: 2 units, 150-199: 3 units, 200-249: 4 units, 250-299: 5 units, 300-349: 6 units), Disp: 15 mL, Rfl: 3   Insulin  Pen Needle (PEN NEEDLES) 31G X 5 MM MISC, 1 each by Does not apply route 3 (three) times daily. May dispense any manufacturer covered by patient's insurance., Disp: 100 each, Rfl: 0   linaclotide  ([name] ) 145 MCG CAPS capsule, Take 1 capsule (145 mcg total) by mouth daily., Disp: 30 capsule, Rfl: 0   nitroGLYCERIN  (NITROSTAT ) 0.4 MG SL tablet, Place 1  tablet (0.4 mg total) under the tongue every 5 (five) minutes as needed for chest pain. If you require more than two tablets five minutes apart go to the nearest ER via EMS., Disp: 30 tablet, Rfl: 0   ondansetron  (ZOFRAN ) 4 MG tablet, TAKE 1 TABLET BY MOUTH EVERY 8 HOURS AS NEEDED FOR NAUSEA & VOMITING, Disp: 20 tablet, Rfl: 10   OneTouch Delica Lancets 33G MISC, Use as directed to check blood sugars 2 times per day dx:e11.65, Disp: 150 each, Rfl: 3   pantoprazole  (PROTONIX ) 40 MG tablet, TAKE ONE (1) TABLET BY MOUTH TWICE DAILY, Disp: 60 tablet, Rfl: 10   polyethylene glycol (MIRALAX  /  GLYCOLAX ) 17 g packet, Take 17 g by mouth daily as needed for mild constipation., Disp: 14 each, Rfl: 0   rosuvastatin  (CRESTOR ) 20 MG tablet, Take 1 tablet (20 mg total) by mouth daily., Disp: 90 tablet, Rfl: 2   temazepam  (RESTORIL ) 30 MG capsule, TAKE 1 CAPSULE BY MOUTH AT BEDTIME AS NEEDED *REFILL REQUEST*, Disp: 30 capsule, Rfl: 0   topiramate  (TOPAMAX ) 25 MG tablet, 1/2 tab bid, Disp: 30 tablet, Rfl: 1   traMADol  (ULTRAM ) 50 MG tablet, Take 1 tablet (50 mg total) by mouth 2 (two) times daily as needed., Disp: 30 tablet, Rfl: 0   TRESIBA  FLEXTOUCH 100 UNIT/ML FlexTouch Pen, Inject 24 units daily. Max daily dose 30 units., Disp: , Rfl:    TRUEPLUS PEN NEEDLES 31G X 6 MM MISC, USE WITH pen TO INJECT insulin  DAILY, Disp: 150 each, Rfl: 3  Consent:   NA  Disposition:   6 months, sooner if needed  Her questions and concerns were addressed to her satisfaction. She voices understanding of the recommendations provided during this encounter.    Signed, Madonna Michele HAS, [facility]  A Division of [facility] [employer] [facility] [address], [place], [place] [address]  [place], [place] [address] 10/13/2023 8:44 AM

## 2023-10-19 ENCOUNTER — Other Ambulatory Visit: Payer: Self-pay | Admitting: Cardiology

## 2023-10-19 DIAGNOSIS — I2510 Atherosclerotic heart disease of native coronary artery without angina pectoris: Secondary | ICD-10-CM

## 2023-10-19 DIAGNOSIS — E1169 Type 2 diabetes mellitus with other specified complication: Secondary | ICD-10-CM

## 2023-11-01 DIAGNOSIS — R001 Bradycardia, unspecified: Secondary | ICD-10-CM | POA: Diagnosis not present

## 2023-11-07 DIAGNOSIS — G5603 Carpal tunnel syndrome, bilateral upper limbs: Secondary | ICD-10-CM | POA: Diagnosis not present

## 2023-11-11 ENCOUNTER — Ambulatory Visit: Payer: Self-pay | Admitting: Cardiology

## 2023-11-11 DIAGNOSIS — R001 Bradycardia, unspecified: Secondary | ICD-10-CM

## 2023-11-14 DIAGNOSIS — Z96652 Presence of left artificial knee joint: Secondary | ICD-10-CM | POA: Diagnosis not present

## 2023-11-14 DIAGNOSIS — M2352 Chronic instability of knee, left knee: Secondary | ICD-10-CM | POA: Diagnosis not present

## 2023-11-16 ENCOUNTER — Other Ambulatory Visit (HOSPITAL_COMMUNITY): Payer: Self-pay | Admitting: Orthopedic Surgery

## 2023-11-16 DIAGNOSIS — Z96652 Presence of left artificial knee joint: Secondary | ICD-10-CM

## 2023-11-17 ENCOUNTER — Telehealth: Payer: Self-pay | Admitting: Pharmacy Technician

## 2023-11-17 ENCOUNTER — Other Ambulatory Visit: Payer: Self-pay | Admitting: Internal Medicine

## 2023-11-17 NOTE — Progress Notes (Signed)
   11/17/2023  Patient ID: Leslie Davis, female   DOB: [birth year], 68 y.o.   MRN: [id number]    11/17/2023 Name: Leslie Davis MRN: [id number] DOB: [birth year]  Patient is appearing on a report for [employer] [place] [employer] and last engaged with the clinical pharmacist to discuss diabetes on 03/21/2023. Contacted patient today to discuss diabetes management and completed medication review.   Diabetes Plan from last clinical pharmacist appointment: Diabetes: - Currently uncontrolled - Reviewed goal A1c, goal fasting, and goal 2 hour post prandial glucose - Recommend to increase Tresiba  to 22 units daily (~20% increase). Increase Humalog  sliding scale (70-150: 2 units, 150-199: 3 units, 200-249: 4 units, 250-299: 5 units, 300-349: 6 units) - Recommend to check glucose using Libre.  - Continue Farxiga  10 mg daily   Follow Up Plan: follow up with PharmD in ~ 4 weeks   Medication Adherence Barriers Identified:  Patient made recommended medication changes per plan:  Patient is now seeing Endo at [facility]. Last appointment was in June. Patient informs she is taking Tresiba  22 unis daily, Farxiga  10mg  daily and Humalog  at 6 units in the morning, 8 units at lunch and 10 units in the evening. Access issues with any new medication or testing device: No Patient reports no access issues with cost and  obtaining medications or sensors. Per Dr Annemarie data, Farxiga  last filled for 30 day supply on 11/17/23, Humalog  15ml (83 day supply) on 11/07/23, 2 Libre sensors on 11/02/2023 and Tresiba  for 15ml (150 day supply) on 6/26 though this dose has changed since last fill date.  Patient is checking blood sugars as prescribed: Yes Patient uses FSL3 CGM and she reports blood sugars are averaging in the 140s. She informs her blood sugar Leslie go up to over 200 if she eats something she isn't supposed to eat but then it Leslie come back down. She informs she had one instance when it dropped down to  60. This occurred in the morning and she ate something and it came back up.   Medication Adherence Barriers Addressed/Actions Taken:  Reviewed medication changes per plan from last clinical pharmacist note Educated patient to contact pharmacy regarding new prescriptions  Reviewed instructions for monitoring blood sugars at home and reminded patient to keep a written log to review with pharmacist Reminded patient of date/time of upcoming clinical pharmacist follow up and any upcoming PCP/specialists visits. Patient denies transportation barriers to the appointment. Yes  Next clinical pharmacist appointment is scheduled for: TBD  Kate Caddy, CPhT [facility] Office: [phone] Email: [email or web address]

## 2023-11-18 MED ORDER — METOPROLOL SUCCINATE ER 25 MG PO TB24: 12.5000 mg | ORAL_TABLET | Freq: Every day | ORAL | 3 refills | Status: DC

## 2023-11-23 ENCOUNTER — Encounter (HOSPITAL_COMMUNITY): Payer: Self-pay

## 2023-11-23 ENCOUNTER — Ambulatory Visit (HOSPITAL_COMMUNITY)
Admission: RE | Admit: 2023-11-23 | Discharge: 2023-11-23 | Disposition: A | Discharge status: Home / Self Care | Source: Ambulatory Visit | Attending: Orthopedic Surgery | Admitting: Orthopedic Surgery

## 2023-11-23 ENCOUNTER — Encounter (HOSPITAL_COMMUNITY)
Admission: RE | Admit: 2023-11-23 | Discharge: 2023-11-23 | Disposition: A | Discharge status: Home / Self Care | Source: Ambulatory Visit | Attending: Orthopedic Surgery | Admitting: Orthopedic Surgery

## 2023-11-23 DIAGNOSIS — M25562 Pain in left knee: Secondary | ICD-10-CM | POA: Diagnosis not present

## 2023-11-23 DIAGNOSIS — Z96652 Presence of left artificial knee joint: Secondary | ICD-10-CM | POA: Diagnosis not present

## 2023-11-23 DIAGNOSIS — Z471 Aftercare following joint replacement surgery: Secondary | ICD-10-CM | POA: Diagnosis not present

## 2023-11-23 MED ORDER — TECHNETIUM TC 99M MEDRONATE IV KIT
18.5000 | PACK | Freq: Once | INTRAVENOUS | Status: AC
  Administered 2023-11-23: 18.5 via INTRAVENOUS

## 2023-11-29 ENCOUNTER — Telehealth: Payer: Self-pay | Admitting: Pharmacist

## 2023-11-29 DIAGNOSIS — E1165 Type 2 diabetes mellitus with hyperglycemia: Secondary | ICD-10-CM

## 2023-11-29 NOTE — Progress Notes (Signed)
 12/02/2023 Name: Leslie Davis MRN: [id number] DOB: 1955/05/31  Chief Complaint  Patient presents with   Medication Management    Diabetes     Leslie Davis is a 68 y.o. year old female who presented for a telephone visit.   They were referred to the pharmacist by a quality report for assistance in managing diabetes.    Subjective: Leslie Davis is a 68 year old female with multiple medical conditions including but not limited to:  coronary artery disease, type 2 diabetes, fatty liver, GERD, hyperlipidemia, CKD stage III, and obesity. She was on the [employer] Report for Diabetes.  Her most recent HgA1c was 8.3%.  Care Team: Primary Care Provider: Jarold Medici, Leslie Davis ; Next Scheduled Visit: 12/06/23   Medication Access/Adherence  Current Pharmacy:  [facility] [place], [place] - [address] [facility] [address] [place] [address] Phone: [phone] Fax: [phone]  [id number] [place], [place] - [address] [place] [address] Phone: [phone] Fax: [phone]  [facility] - [address] [place] - [address] [place] [address] Phone: [phone] Fax: [phone]  [facility] [place]  - [place] - [address] [place] [address] Phone: [phone] Fax: [phone]   Patient reports affordability concerns with their medications: No  Patient reports access/transportation concerns to their pharmacy: No  Patient reports adherence concerns with their medications:  No      Diabetes:  Current medications:   Farxiga  10 mg 1 tablet daily Humalog  0-6 units per sliding scales Tresiba  24 units daily (Patient is followed by Endocrinology at [facility])  Current glucose readings: Uses CGM but will need to have the reader downloaded in clinic.  Patient denies  hypoglycemic s/sx including  dizziness, shakiness, sweating. Patient denies hyperglycemic symptoms including  polyuria, polydipsia, polyphagia, nocturia, neuropathy, blurred vision.  Current meal patterns:  - Breakfast: [place] sausage, eggs toast - [address] with ground beef or chicken - Supper Greens, chicken,  - Snacks apple, grapes, strawberries, ice cream - Drinks coffee in the morning with creamer, soda, water  with lemon  Current physical activity: aerobics on Tuesdays and Thursdays     Macrovascular and Microvascular Risk Reduction:  Statin? yes (Rosuvastatin  20 mg last filled 10/19/23 #30); ACEi/ARB? Yes Edarbi  80 mg Last urinary albumin/creatinine ratio:  Lab Results  Component Value Date   MICRALBCREAT 1,984 (H) 07/06/2023   MICRALBCREAT 959 (H) 01/04/2023   MICRALBCREAT 289 (H) 12/02/2021   MICRALBCREAT 602 (H) 11/20/2020   MICRALBCREAT 30-300 11/15/2019   MICRALBCREAT 43 (H) 11/09/2018   Last eye exam:  Lab Results  Component Value Date   HMDIABEYEEXA No Retinopathy 03/09/2022   Last foot exam: No foot exam found Tobacco Use:  Tobacco Use: Medium Risk (11/30/2023)   Patient History    Smoking Tobacco Use: Former    Smokeless Tobacco Use: Never    Passive Exposure: Not on file     Objective:  Lab Results  Component Value Date   HGBA1C 8.3 (H) 07/06/2023    Lab Results  Component Value Date   CREATININE 1.45 (H) 07/06/2023   BUN 27 07/06/2023   NA 139 07/06/2023   K 5.2 07/06/2023   CL 102 07/06/2023   CO2 21 07/06/2023    Lab Results  Component Value Date   CHOL 197 07/06/2023   HDL 99 07/06/2023   LDLCALC  81 07/06/2023   TRIG 98 07/06/2023   CHOLHDL 2.0 07/06/2023    Medications Reviewed Today     Reviewed by Leslie Davis, [facility] (Pharmacist) on 11/29/23 at 1641  Med List Status: <None>   Medication Order Taking? Sig Documenting Provider Last Dose Status Informant  acetaminophen  (TYLENOL ) 500 MG tablet [id number] Yes Take 1 tablet  (500 mg total) by mouth every 6 (six) hours as needed for mild pain. Leslie Leonor CROME, Leslie Davis  Active   amLODipine  (NORVASC ) 10 MG tablet [id number] Yes TAKE 1 TABLET BY MOUTH ONCE DAILY Leslie Speaks, Leslie Davis  Active   ASPIRIN  LOW DOSE 81 MG tablet [id number] Yes TAKE 1 TABLET BY MOUTH ONCE DAILY *SWALLOW WHOLEDEWAINE Leslie Medici, Leslie Davis  Active   Blood Glucose Monitoring Suppl [facility] VERIO) w/Device KIT [id number] Yes Use as directed to check blood sugars 2 times per day dx: e11.65 Leslie Medici, Leslie Davis  Active Self  celecoxib  (CELEBREX ) 200 MG capsule [id number] Yes Take 1 capsule (200 mg total) by mouth 2 (two) times daily as needed. Leslie Ronal CROME, Leslie Davis  Active Self           Med Note Leslie Davis, Leslie Davis   Wed Feb 23, 2023  3:01 PM) As needed  cetirizine  (ALLERGY RELIEF CETIRIZINE ) 10 MG tablet [id number] Yes TAKE 1 TABLET BY MOUTH ONCE DAILY Leslie Medici, Leslie Davis  Active   clindamycin (CLEOCIN Davis) 1 % lotion [id number] Yes Apply 1 Application topically daily as needed (acne). Provider, Historical, Leslie Davis  Active Self  Continuous Glucose Receiver (FREESTYLE LIBRE 3 READER) DEVI [id number] Yes Use to check glucose continuously Leslie Medici, Leslie Davis  Active   Continuous Glucose Sensor (FREESTYLE LIBRE 3 PLUS SENSOR) MISC [id number] Yes Change sensor every 15 days. Leslie Medici, Leslie Davis  Active   EDARBI  80 MG TABS [id number] Yes TAKE 1 TABLET BY MOUTH ONCE DAILY Leslie Medici, Leslie Davis  Active   ezetimibe  (ZETIA ) 10 MG tablet [id number] Yes TAKE 1 TABLET BY MOUTH ONCE DAILY *PATIENT MUST SCHEDULE AN OVERDUE FOLLOW UP APPOINTMENT WITH CARDIOLOGY FOR ANY MORE REFILLS [id number] Large, Sunit, Leslie Davis  Active   FARXIGA  10 MG TABS tablet [id number] Yes TAKE 1 TABLET BY MOUTH DAILY BEFORE Leslie Davis Leslie Medici, Leslie Davis  Active   ferrous gluconate  (FERGON) 324 MG tablet [id number] Yes TAKE 1 TABLET BY MOUTH TWICE DAILY WITH A MEAL. Leslie Davis, Leslie Davis Raddle., Leslie Davis  Active Self  glucose blood [facility] VERIO) test strip [id number] Yes USE TO TEST BLOOD SUGAR  TWICE DAILY AS DIRECTED Leslie Medici, Leslie Davis  Active   insulin  lispro (HUMALOG ) 100 UNIT/ML KwikPen [id number] Yes Inject 0-6 Units into the skin 3 (three) times daily with meals. Inject up to 3 times daily with meals per sliding scale (70-150: 2 units, 150-199: 3 units, 200-249: 4 units, 250-299: 5 units, 300-349: 6 units) Leslie Medici, Leslie Davis  Active   Insulin  Pen Needle (PEN NEEDLES) 31G X 5 MM MISC [id number] Yes 1 each by Does not apply route 3 (three) times daily. May dispense any manufacturer covered by patient's insurance. Rojelio Nest, Leslie Davis  Active   linaclotide  ([name] ) 145 MCG CAPS capsule [id number]  Take 1 capsule (145 mcg total) by mouth daily.  Patient not taking: Reported on 11/29/2023   [place], Stephane, Leslie Davis  Active   metoprolol  succinate (TOPROL  XL) 25 MG 24 hr tablet [id number] Yes Take 0.5 tablets (12.5 mg total) by mouth daily. Tolia, Sunit, Leslie Davis  Active   nitroGLYCERIN  (NITROSTAT ) 0.4 MG SL tablet [id number] Yes Place 1 tablet (0.4 mg total) under  the tongue every 5 (five) minutes as needed for chest pain. If you require more than two tablets five minutes apart go to the nearest ER via EMS. Tolia, Sunit, Leslie Davis  Active Self  ondansetron  (ZOFRAN ) 4 MG tablet [id number] Yes TAKE 1 TABLET BY MOUTH EVERY 8 HOURS AS NEEDED FOR NAUSEA & VOMITING Leslie Medici, Leslie Davis  Active   OneTouch Delica Lancets 33G [place] [id number] Yes Use as directed to check blood sugars 2 times per day dx:e11.65 Leslie Medici, Leslie Davis  Active Self  pantoprazole  (PROTONIX ) 40 MG tablet [id number] Yes TAKE ONE (1) TABLET BY MOUTH TWICE DAILY Leslie Medici, Leslie Davis  Active   polyethylene glycol (MIRALAX  / GLYCOLAX ) 17 g packet [id number] Yes Take 17 g by mouth daily as needed for mild constipation. Leslie Leonor CROME, Leslie Davis  Active   rosuvastatin  (CRESTOR ) 40 MG tablet [id number] Yes Take 1 tablet (40 mg total) by mouth daily. Tolia, Sunit, Leslie Davis  Active   temazepam  (RESTORIL ) 30 MG capsule [id number] Yes TAKE 1 CAPSULE BY MOUTH AT BEDTIME AS NEEDED *REFILL  REQUESTDEWAINE Leslie Medici, Leslie Davis  Active   topiramate  (TOPAMAX ) 25 MG tablet [id number]  1/2 tab bid  Patient not taking: Reported on 11/29/2023   Midge Sober, Leslie Davis  Active   traMADol  (ULTRAM ) 50 MG tablet [id number] Yes Take 1 tablet (50 mg total) by mouth 2 (two) times daily as needed. Leslie Ronal CROME, Leslie Davis  Active   TRESIBA  FLEXTOUCH 100 UNIT/ML FlexTouch Pen [id number] Yes Inject 24 units daily. Max daily dose 30 units.  Patient taking differently: 26 Units. Inject 24 units daily. Max daily dose 30 units.   Leslie Medici, Leslie Davis  Active   TRUEPLUS PEN NEEDLES 31G X 6 MM MISC [id number] Yes USE WITH pen TO INJECT insulin  DAILY Leslie Speaks, Leslie Davis  Active Self              11/30/2023    2:00 PM 10/13/2023    8:14 AM 08/02/2023   10:59 AM  Vitals with BMI  Height  5' 4   Weight 224 lbs 6 oz 226 lbs 6 oz   BMI  38.84   Systolic 128 130 831  Diastolic 70 68 75  Pulse 69 49        Assessment/Plan:   Diabetes: - Currently uncontrolled; goal A1c <7%. Cardiorenal risk reduction is opportunities for improvement.. Blood pressure is at goal <130/80. LDL is at goal.  - Follow up after upcoming appointment with labs.   Follow Up Plan:    Follow up after upcoming appointment. Send note to PCP about 90 day supplies with Chronic meds and Rosuvastatin  not being filled since 10/19/23   Cassius DOROTHA Brought, PharmD, [facility] Clinical Pharmacist [phone]

## 2023-11-30 ENCOUNTER — Other Ambulatory Visit (HOSPITAL_COMMUNITY): Payer: Self-pay

## 2023-11-30 ENCOUNTER — Encounter: Payer: Self-pay | Admitting: Family Medicine

## 2023-11-30 ENCOUNTER — Ambulatory Visit: Admitting: Family Medicine

## 2023-11-30 ENCOUNTER — Other Ambulatory Visit: Payer: Self-pay

## 2023-11-30 ENCOUNTER — Ambulatory Visit: Payer: Self-pay

## 2023-11-30 VITALS — BP 128/70 | HR 69 | Wt 224.4 lb

## 2023-11-30 DIAGNOSIS — R062 Wheezing: Secondary | ICD-10-CM | POA: Diagnosis not present

## 2023-11-30 DIAGNOSIS — J069 Acute upper respiratory infection, unspecified: Secondary | ICD-10-CM

## 2023-11-30 LAB — POC COVID19/FLU A&B COMBO
Covid Antigen, POC: NEGATIVE
Influenza A Antigen, POC: NEGATIVE
Influenza B Antigen, POC: NEGATIVE

## 2023-11-30 MED ORDER — ACCU-CHEK GUIDE TEST VI STRP: ORAL_STRIP | 12 refills | Status: AC

## 2023-11-30 MED ORDER — METHYLPREDNISOLONE 4 MG PO TBPK: ORAL_TABLET | ORAL | 0 refills | Status: DC

## 2023-11-30 MED ORDER — FLUTICASONE PROPIONATE 50 MCG/ACT NA SUSP
2.0000 | Freq: Every day | NASAL | 6 refills | Status: AC
Start: 2023-11-30 — End: ?

## 2023-11-30 MED ORDER — ACCU-CHEK FASTCLIX LANCETS MISC: 2 refills | Status: AC

## 2023-11-30 NOTE — Telephone Encounter (Signed)
 FYI Only or Action Required?: FYI only for provider.  Patient was last seen in primary care on 08/02/2023 by Midge Sober, DO.  Called Nurse Triage reporting Nasal Congestion, Cough, Generalized Body Aches, and Fever.  Symptoms began yesterday.  Interventions attempted: OTC medications: mucinex .  Symptoms are: gradually worsening.  Triage Disposition: See Physician Within 24 Hours  Patient/caregiver understands and will follow disposition?: Yes   Copied from CRM [id number]. Topic: Clinical - Medication Question >> Nov 30, 2023  8:18 AM Myrick T wrote: Reason for CRM: patient called stated she is experiencing nasal congestion, body aches, low grade fever and coughing up phlegm. She is asking provider to call a script in for her symptoms. Please f/u with patient Reason for Disposition  Fever present > 3 days (72 hours)  Answer Assessment - Initial Assessment Questions 1. SYMPTOMS: What is your main symptom or concern? (e.g., cough, fever, shortness of breath, muscle aches)     Cough, stuffy nose, bodyaches, head ache, fever 2. ONSET: When did the symptoms start?      One day ago 3. COUGH: Do you have a cough? If Yes, ask: How bad is the cough?       Throat hurts when coughing 4. FEVER: Do you have a fever? If Yes, ask: What is your temperature, how was it measured, and when did it start?     Has had chill, but not measured temp 5. BREATHING DIFFICULTY: Are you having any difficulty breathing? (e.g., normal; shortness of breath, wheezing, unable to speak)      denies 6. BETTER-SAME-WORSE: Are you getting better, staying the same or getting worse compared to yesterday?  If getting worse, ask, In what way?     worse 7. OTHER SYMPTOMS: Do you have any other symptoms?  (e.g., chills, fatigue, headache, loss of smell or taste, muscle pain, sore throat)     Chills, muscle pain 8. INFLUENZA EXPOSURE: Was there any known exposure to influenza (flu) before the  symptoms began?      unknown 9. INFLUENZA SUSPECTED: Why do you think you have influenza? (e.g., positive flu self-test at home, symptoms after exposure).     unknown 10. INFLUENZA VACCINE: Have you had the flu vaccine? If Yes, ask: When did you last get it?        11. HIGH RISK FOR COMPLICATIONS: Do you have any chronic medical problems? (e.g., asthma, heart or lung disease, obesity, weak immune system)       obesity 12. PREGNANCY: Is there any chance you are pregnant? When was your last menstrual period?       N/A 13. O2 SATURATION MONITOR:  Do you use an oxygen saturation monitor (pulse oximeter) at home? If Yes, ask What is your reading (oxygen level) today? What is your usual oxygen saturation reading? (e.g., 95%)  Protocols used: Influenza (Flu) Suspected-A-AH

## 2023-11-30 NOTE — Progress Notes (Signed)
   Name: Leslie Davis   Date of Visit: 11/30/23   Date of last visit with me: Visit date not found   CHIEF COMPLAINT:  Chief Complaint  Patient presents with   Acute Visit    Cough, congestion, body aches.        HPI:  Discussed the use of AI scribe software for clinical note transcription with the patient, who gave verbal consent to proceed.  History of Present Illness   Leslie Davis is a 68 year old female who presents with body aches and cough.  She has been experiencing body aches and a cough that began yesterday. The body aches are the most bothersome symptom. No known exposure to sick contacts. She has not measured her temperature at home but feels warm, particularly earlier in the day.  No smoking history and no history of COPD. She has not used nasal sprays previously.         OBJECTIVE:       01/04/2023    9:44 AM  Depression screen PHQ 2/9  Decreased Interest 0  Down, Depressed, Hopeless 0  PHQ - 2 Score 0  Altered sleeping 0  Tired, decreased energy 0  Change in appetite 0  Feeling bad or failure about yourself  0  Trouble concentrating 0  Moving slowly or fidgety/restless 0  Suicidal thoughts 0  PHQ-9 Score 0  Difficult doing work/chores Not difficult at all     BP Readings from Last 3 Encounters:  11/30/23 128/70  10/13/23 130/68  08/02/23 (!) 168/75    BP 128/70   Pulse 69   Wt 224 lb 6.4 oz (101.8 kg)   SpO2 98%   BMI 38.52 kg/m    Physical Exam   CHEST: Wheezing present.      Physical Exam Constitutional:      General: She is not in acute distress.    Appearance: Normal appearance.  Cardiovascular:     Rate and Rhythm: Normal rate and regular rhythm.  Pulmonary:     Effort: Pulmonary effort is normal. No respiratory distress.     Breath sounds: No stridor. Wheezing (Noted expiratory wheeze) present. No rhonchi.  Chest:     Chest wall: No tenderness.  Neurological:     General: No focal deficit present.      Mental Status: She is alert and oriented to person, place, and time. Mental status is at baseline.     ASSESSMENT/PLAN:   Assessment & Plan Viral URI with cough  Wheezing    Assessment and Plan    Acute viral upper respiratory infection with wheezing Viral etiology confirmed by negative influenza and COVID-19 tests. Wheezing indicates inflammation. Steroid treatment necessary. - Prescribed Medrol  dose pack, follow package directions. - Recommended alternating acetaminophen  and ibuprofen  for symptom relief. - Prescribed Flonase  nasal spray, two sprays each nostril at night. - Advised monitoring blood glucose levels due to potential steroid-induced increase.  - f/u if not improvements         Cheyne Boulden A. Vita MD [facility]

## 2023-12-02 ENCOUNTER — Ambulatory Visit: Payer: Self-pay

## 2023-12-02 NOTE — Telephone Encounter (Signed)
 FYI Only or Action Required?: Action required by provider: Requesting cough medication.  Patient was last seen in primary care on 11/30/2023 by Vita Morrow, MD.  Called Nurse Triage reporting Cough.  Symptoms began several days ago.  Interventions attempted: Prescription medications: Flonase , Steroids.  Symptoms are: gradually worsening.  Triage Disposition: See PCP When Office is Open (Within 3 Days)  Patient/caregiver understands and will follow disposition?: Yes     Copied from CRM #[id number]. Topic: Clinical - Red Word Triage >> Dec 02, 2023 11:13 AM Treva T wrote: [facility] that prompted transfer to Nurse Triage: Patient has increased coughing, shortness of breath, chest congestion. Reason for Disposition  [1] Nasal discharge AND [2] present > 10 days    Symptoms stared Tuesday.  Answer Assessment - Initial Assessment Questions Using a nasal spray and steroids for symptoms. Patient states she is having continuing chest and nasal congestion after visit with a different provider on 11/30/23. Patient is requesting cough medicine to be sent into her pharmacy on file [facility]).    1. ONSET: When did the cough begin?      Tuesday  2. SEVERITY: How bad is the cough today?      Severe  3. DIFFICULTY BREATHING: Are you having difficulty breathing? If Yes, ask: How bad is it? (e.g., mild, moderate, severe)      Shortness of breath when doing things  4. FEVER: Do you have a fever? If Yes, ask: What is your temperature, how was it measured, and when did it start?     Denies  5. OTHER SYMPTOMS: Do you have any other symptoms? (e.g., runny nose, wheezing, chest pain)       Denies  Protocols used: Cough - Acute Non-Productive-A-AH

## 2023-12-04 ENCOUNTER — Other Ambulatory Visit: Payer: Self-pay | Admitting: Internal Medicine

## 2023-12-05 ENCOUNTER — Other Ambulatory Visit

## 2023-12-05 DIAGNOSIS — G8929 Other chronic pain: Secondary | ICD-10-CM | POA: Diagnosis not present

## 2023-12-05 DIAGNOSIS — M25562 Pain in left knee: Secondary | ICD-10-CM | POA: Diagnosis not present

## 2023-12-06 ENCOUNTER — Other Ambulatory Visit (HOSPITAL_COMMUNITY): Payer: Self-pay

## 2023-12-06 ENCOUNTER — Encounter: Payer: Self-pay | Admitting: Internal Medicine

## 2023-12-06 ENCOUNTER — Ambulatory Visit (INDEPENDENT_AMBULATORY_CARE_PROVIDER_SITE_OTHER): Payer: Self-pay | Admitting: Internal Medicine

## 2023-12-06 VITALS — BP 130/78 | HR 56 | Temp 98.4°F | Ht 64.0 in | Wt 225.0 lb

## 2023-12-06 DIAGNOSIS — R051 Acute cough: Secondary | ICD-10-CM

## 2023-12-06 DIAGNOSIS — E7800 Pure hypercholesterolemia, unspecified: Secondary | ICD-10-CM

## 2023-12-06 DIAGNOSIS — R059 Cough, unspecified: Secondary | ICD-10-CM

## 2023-12-06 LAB — SEDIMENTATION RATE: Sed Rate: 46 mm/h — ABNORMAL HIGH (ref 0–40)

## 2023-12-06 LAB — C-REACTIVE PROTEIN: CRP: 10 mg/L (ref 0–10)

## 2023-12-06 MED ORDER — ALBUTEROL SULFATE HFA 108 (90 BASE) MCG/ACT IN AERS: 2.0000 | INHALATION_SPRAY | Freq: Four times a day (QID) | RESPIRATORY_TRACT | 2 refills | Status: AC | PRN

## 2023-12-06 MED ORDER — ROSUVASTATIN CALCIUM 40 MG PO TABS: 40.0000 mg | ORAL_TABLET | Freq: Every day | ORAL | 2 refills | Status: DC

## 2023-12-06 NOTE — Patient Instructions (Signed)

## 2023-12-06 NOTE — Progress Notes (Signed)
 I,Victoria T Emmitt, CMA,acting as a Neurosurgeon for Catheryn LOISE Slocumb, MD.,have documented all relevant documentation on the behalf of Catheryn LOISE Slocumb, MD,as directed by  Catheryn LOISE Slocumb, MD while in the presence of Catheryn LOISE Slocumb, MD.  Subjective:  Patient ID: Leslie Davis , female    DOB: [birth year] , 68 y.o.   MRN: [id number]  Chief Complaint  Patient presents with   Cough    Patient presents today for cough. Initially started last Tuesday. She also experiences stuffy nose & chest congestion. Denies fever/chills. She has tried Mucinex  OTC which didn't help. She also was seen at a local urgent care last Wednesday, she was given steroids which did not help. She did test for flu & covid, negative result. She has not complete dm eye exam.    HPI Discussed the use of AI scribe software for clinical note transcription with the patient, who gave verbal consent to proceed.  History of Present Illness Leslie Davis is a 68 year old female who presents with a persistent cold and chest congestion.  She has been experiencing symptoms of a cold since last Tuesday, primarily in her chest, which she attributes to sinus issues and a change in the weather. She visited urgent care on the tenth and was prescribed steroids and a nasal spray.  COVID test was negative.  No fever or chills. She reports increased shortness of breath compared to her baseline and has not been using an inhaler. No pain when swallowing or sore throat. She reports that she is whispering, which she attributes to her cold.  She has been taking Mucinex  to help with her symptoms but feels like she cannot effectively cough up phlegm.   Past Medical History:  Diagnosis Date   CKD (chronic kidney disease)    stage 3   Coronary artery disease 07/2021   obstructive CAD-70% LAD-treated medically   DM (diabetes mellitus) (HCC)    Fatty liver disease, nonalcoholic    Gastritis    Gastroparesis    GERD  (gastroesophageal reflux disease)    Heartburn    Hiatal hernia    HLD (hyperlipidemia)    HTN (hypertension)    Joint pain    Renal calculus    Rheumatoid arthritis (HCC)    Vitamin D  deficiency      Family History  Problem Relation Age of Onset   Diabetes Mother    Thyroid  disease Mother    Kidney disease Mother    Hypertension Father    Hyperlipidemia Father    Sudden death Father    Stroke Father    Diabetes Sister    Heart disease Sister    Kidney disease Sister    COPD Sister    Heart disease Brother 61   Diabetes Brother    Stomach cancer Maternal Aunt        X 2 aunts   Diabetes Maternal Grandmother    Colon cancer Neg Hx    Esophageal cancer Neg Hx    Rectal cancer Neg Hx    Colon polyps Neg Hx      Current Outpatient Medications:    Accu-Chek FastClix Lancets MISC, USE AS NEEDED TO CHECK BLOOD SUGARS., Disp: 100 each, Rfl: 2   acetaminophen  (TYLENOL ) 500 MG tablet, Take 1 tablet (500 mg total) by mouth every 6 (six) hours as needed for mild pain., Disp: 30 tablet, Rfl: 0   albuterol  (VENTOLIN  HFA) 108 (90 Base) MCG/ACT inhaler, Inhale 2 puffs into the lungs  every 6 (six) hours as needed for wheezing or shortness of breath., Disp: 17 g, Rfl: 2   amLODipine  (NORVASC ) 10 MG tablet, TAKE 1 TABLET BY MOUTH ONCE DAILY, Disp: 330 tablet, Rfl: 11   ASPIRIN  LOW DOSE 81 MG tablet, TAKE 1 TABLET BY MOUTH ONCE DAILY *SWALLOW WHOLE*, Disp: 30 tablet, Rfl: 10   Blood Glucose Monitoring Suppl (ONETOUCH VERIO) w/Device KIT, Use as directed to check blood sugars 2 times per day dx: e11.65, Disp: 1 kit, Rfl: 1   celecoxib  (CELEBREX ) 200 MG capsule, Take 1 capsule (200 mg total) by mouth 2 (two) times daily as needed., Disp: 180 capsule, Rfl: 2   cetirizine  (ALLERGY RELIEF CETIRIZINE ) 10 MG tablet, TAKE 1 TABLET BY MOUTH ONCE DAILY, Disp: 90 tablet, Rfl: 1   clindamycin (CLEOCIN T) 1 % lotion, Apply 1 Application topically daily as needed (acne)., Disp: , Rfl:    Continuous  Glucose Receiver (FREESTYLE LIBRE 3 READER) DEVI, Use to check glucose continuously, Disp: 1 each, Rfl: 0   Continuous Glucose Sensor (FREESTYLE LIBRE 3 PLUS SENSOR) MISC, Change sensor every 15 days., Disp: 6 each, Rfl: 1   EDARBI  80 MG TABS, TAKE 1 TABLET BY MOUTH ONCE DAILY, Disp: 30 tablet, Rfl: 10   ezetimibe  (ZETIA ) 10 MG tablet, TAKE 1 TABLET BY MOUTH ONCE DAILY *PATIENT MUST SCHEDULE AN OVERDUE FOLLOW UP APPOINTMENT WITH CARDIOLOGY FOR ANY MORE REFILLS [phone]*, Disp: 90 tablet, Rfl: 3   FARXIGA  10 MG TABS tablet, TAKE 1 TABLET BY MOUTH DAILY BEFORE BREAKFAST, Disp: 30 tablet, Rfl: 10   ferrous gluconate  (FERGON) 324 MG tablet, TAKE 1 TABLET BY MOUTH TWICE DAILY WITH A MEAL., Disp: 100 tablet, Rfl: 0   fluticasone  (FLONASE ) 50 MCG/ACT nasal spray, Place 2 sprays into both nostrils daily., Disp: 16 g, Rfl: 6   glucose blood (ACCU-CHEK GUIDE TEST) test strip, USE ONCE DAILY TO CHECK BLOOD SUGAR., Disp: 100 each, Rfl: 12   glucose blood (ONETOUCH VERIO) test strip, USE TO TEST BLOOD SUGAR TWICE DAILY AS DIRECTED, Disp: 100 each, Rfl: 10   insulin  lispro (HUMALOG ) 100 UNIT/ML KwikPen, Inject 0-6 Units into the skin 3 (three) times daily with meals. Inject up to 3 times daily with meals per sliding scale (70-150: 2 units, 150-199: 3 units, 200-249: 4 units, 250-299: 5 units, 300-349: 6 units), Disp: 15 mL, Rfl: 3   Insulin  Pen Needle (PEN NEEDLES) 31G X 5 MM MISC, 1 each by Does not apply route 3 (three) times daily. May dispense any manufacturer covered by patient's insurance., Disp: 100 each, Rfl: 0   linaclotide  ([name] ) 145 MCG CAPS capsule, Take 1 capsule (145 mcg total) by mouth daily., Disp: 30 capsule, Rfl: 0   methylPREDNISolone  (MEDROL  DOSEPAK) 4 MG TBPK tablet, Take as directed., Disp: 21 tablet, Rfl: 0   metoprolol  succinate (TOPROL  XL) 25 MG 24 hr tablet, Take 0.5 tablets (12.5 mg total) by mouth daily., Disp: 30 tablet, Rfl: 3   nitroGLYCERIN  (NITROSTAT ) 0.4 MG SL tablet, Place 1  tablet (0.4 mg total) under the tongue every 5 (five) minutes as needed for chest pain. If you require more than two tablets five minutes apart go to the nearest ER via EMS., Disp: 30 tablet, Rfl: 0   ondansetron  (ZOFRAN ) 4 MG tablet, TAKE 1 TABLET BY MOUTH EVERY 8 HOURS AS NEEDED FOR NAUSEA & VOMITING, Disp: 20 tablet, Rfl: 10   OneTouch Delica Lancets 33G MISC, Use as directed to check blood sugars 2 times per day dx:e11.65, Disp: 150  each, Rfl: 3   pantoprazole  (PROTONIX ) 40 MG tablet, TAKE ONE (1) TABLET BY MOUTH TWICE DAILY, Disp: 60 tablet, Rfl: 10   polyethylene glycol (MIRALAX  / GLYCOLAX ) 17 g packet, Take 17 g by mouth daily as needed for mild constipation., Disp: 14 each, Rfl: 0   temazepam  (RESTORIL ) 30 MG capsule, TAKE 1 CAPSULE BY MOUTH AT BEDTIME AS NEEDED *REFILL REQUEST*, Disp: 30 capsule, Rfl: 0   topiramate  (TOPAMAX ) 25 MG tablet, 1/2 tab bid, Disp: 30 tablet, Rfl: 1   traMADol  (ULTRAM ) 50 MG tablet, Take 1 tablet (50 mg total) by mouth 2 (two) times daily as needed., Disp: 30 tablet, Rfl: 0   TRESIBA  FLEXTOUCH 100 UNIT/ML FlexTouch Pen, Inject 24 units daily. Max daily dose 30 units., Disp: , Rfl:    TRUEPLUS PEN NEEDLES 31G X 6 MM MISC, USE WITH pen TO INJECT insulin  DAILY, Disp: 150 each, Rfl: 3   rosuvastatin  (CRESTOR ) 40 MG tablet, Take 1 tablet (40 mg total) by mouth daily., Disp: 100 tablet, Rfl: 2   Allergies  Allergen Reactions   Fiasp  [Insulin  Aspart (W-Niacinamide)] Hives     Review of Systems  Constitutional: Negative.   Respiratory:  Positive for cough.   Cardiovascular: Negative.   Neurological: Negative.   Psychiatric/Behavioral: Negative.       Today's Vitals   12/06/23 1045  BP: 130/78  Pulse: (!) 56  Temp: 98.4 F (36.9 C)  SpO2: 98%  Weight: 225 lb (102.1 kg)  Height: 5' 4 (1.626 m)   Body mass index is 38.62 kg/m.  Wt Readings from Last 3 Encounters:  12/06/23 225 lb (102.1 kg)  11/30/23 224 lb 6.4 oz (101.8 kg)  10/13/23 226 lb 6.4 oz  (102.7 kg)     Objective:  Physical Exam Vitals and nursing note reviewed.  Constitutional:      Appearance: Normal appearance.  HENT:     Head: Normocephalic and atraumatic.     Right Ear: Tympanic membrane, ear canal and external ear normal. There is no impacted cerumen.     Left Ear: Tympanic membrane, ear canal and external ear normal. There is no impacted cerumen.  Eyes:     Extraocular Movements: Extraocular movements intact.  Cardiovascular:     Rate and Rhythm: Normal rate and regular rhythm.     Heart sounds: Normal heart sounds.  Pulmonary:     Effort: Pulmonary effort is normal.     Comments: Decreased breath sounds Skin:    General: Skin is warm.  Neurological:     General: No focal deficit present.     Mental Status: She is alert.  Psychiatric:        Mood and Affect: Mood normal.        Behavior: Behavior normal.         Assessment And Plan:  Acute cough Assessment & Plan: Acute upper respiratory infection with cough- Administer nebulizer treatment. - Consider chest x-ray if symptoms persist or worsen. - Symptoms improved with albuterol  nebulizer tx x 1 - Send rx albuterol  inhaler to use prn  Orders: -     Albuterol  Sulfate HFA; Inhale 2 puffs into the lungs every 6 (six) hours as needed for wheezing or shortness of breath.  Dispense: 17 g; Refill: 2  Pure hypercholesterolemia -     Rosuvastatin  Calcium ; Take 1 tablet (40 mg total) by mouth daily.  Dispense: 100 tablet; Refill: 2   Return if symptoms worsen or fail to improve.  Patient was given opportunity to  ask questions. Patient verbalized understanding of the plan and was able to repeat key elements of the plan. All questions were answered to their satisfaction.   I, Catheryn LOISE Slocumb, MD, have reviewed all documentation for this visit. The documentation on 12/06/23 for the exam, diagnosis, procedures, and orders are all accurate and complete.   IF YOU HAVE BEEN REFERRED TO A SPECIALIST, IT MAY  TAKE 1-2 WEEKS TO SCHEDULE/PROCESS THE REFERRAL. IF YOU HAVE NOT HEARD FROM US /SPECIALIST IN TWO WEEKS, PLEASE GIVE US  A CALL AT [phone].   THE PATIENT IS ENCOURAGED TO PRACTICE SOCIAL DISTANCING DUE TO THE COVID-19 PANDEMIC.

## 2023-12-07 ENCOUNTER — Ambulatory Visit: Payer: Self-pay | Admitting: Internal Medicine

## 2023-12-09 DIAGNOSIS — M25562 Pain in left knee: Secondary | ICD-10-CM | POA: Diagnosis not present

## 2023-12-09 DIAGNOSIS — Z96652 Presence of left artificial knee joint: Secondary | ICD-10-CM | POA: Diagnosis not present

## 2023-12-11 NOTE — Assessment & Plan Note (Addendum)
 Acute upper respiratory infection with cough- Administer nebulizer treatment. - Consider chest x-ray if symptoms persist or worsen. - Symptoms improved with albuterol  nebulizer tx x 1 - Send rx albuterol  inhaler to use prn

## 2023-12-13 ENCOUNTER — Ambulatory Visit: Payer: Self-pay

## 2023-12-13 NOTE — Telephone Encounter (Signed)
 FYI Only or Action Required?: Action required by provider: medication refill request.  Patient was last seen in primary care on 12/06/2023 by Jarold Medici, MD.  Called Nurse Triage reporting Cough.  Symptoms began a week ago.  Interventions attempted: Prescription medications: inhaler.  Symptoms are: unchanged.  Triage Disposition: Go to ED Now (or PCP Triage)  Patient/caregiver understands and will follow disposition?: No, wishes to speak with PCP  Copied from CRM #[id number]. Topic: Clinical - Red Word Triage >> Dec 13, 2023  1:15 PM Donee H wrote: [facility] that prompted transfer to Nurse Triage: Patient states experiencing shortness of breathe, wheezing in chest, and coughing consistently. Patient was recently seen by pcp last week but states not getting better. Was given an inhaler and nasal spray. Reason for Disposition  Patient sounds very sick or weak to the triager  Answer Assessment - Initial Assessment Questions 1. RESPIRATORY STATUS: Describe your breathing? (e.g., wheezing, shortness of breath, unable to speak, severe coughing)      Pt states she is SOB going to bathroom, or speaking to NT.   Pt advised to go to the ED, pt refused. Pt states that she wants other medication than what she was given at her most recent visit. Pt states that she was waiting for a phone call back from clinic nurse yesterday. Pt states that she needs cough medicine or something. Pt states she is not worse nor better than when seen last week.  Protocols used: Breathing Difficulty-A-AH

## 2023-12-14 ENCOUNTER — Other Ambulatory Visit: Payer: Self-pay

## 2023-12-14 ENCOUNTER — Other Ambulatory Visit: Payer: Self-pay | Admitting: Internal Medicine

## 2023-12-14 DIAGNOSIS — R051 Acute cough: Secondary | ICD-10-CM

## 2023-12-14 MED ORDER — AZITHROMYCIN 250 MG PO TABS: ORAL_TABLET | ORAL | 0 refills | Status: DC

## 2023-12-17 ENCOUNTER — Other Ambulatory Visit: Payer: Self-pay | Admitting: Internal Medicine

## 2023-12-19 ENCOUNTER — Other Ambulatory Visit: Payer: Self-pay | Admitting: Internal Medicine

## 2023-12-19 DIAGNOSIS — K2970 Gastritis, unspecified, without bleeding: Secondary | ICD-10-CM

## 2023-12-19 NOTE — Telephone Encounter (Unsigned)
 Copied from CRM #[id number]. Topic: Clinical - Medication Refill >> Dec 19, 2023 10:47 AM Willma R wrote: Medication: TRESIBA  FLEXTOUCH 100 UNIT/ML FlexTouch Pen  Has the patient contacted their pharmacy? Yes, call dr  This is the patient's preferred pharmacy:  [facility] [place], [place] - [address] [facility] - [facility] [address] [place] [address] Phone: [phone] Fax: [phone]  Is this the correct pharmacy for this prescription? Yes If no, delete pharmacy and type the correct one.   Has the prescription been filled recently? No  Is the patient out of the medication? Yes  Has the patient been seen for an appointment in the last year OR does the patient have an upcoming appointment? Yes  Can we respond through MyChart? Yes  Agent: Please be advised that Rx refills may take up to 3 business days. We ask that you follow-up with your pharmacy.

## 2024-01-03 ENCOUNTER — Telehealth: Payer: Self-pay | Admitting: Internal Medicine

## 2024-01-03 NOTE — Telephone Encounter (Signed)
 CALLED PT TO SEE IF SHE WOULD LIKE TO DO AWV ON 01/04/24 OVER THE TELEPHONE NO ANSWER LEFT VM

## 2024-01-04 ENCOUNTER — Ambulatory Visit: Payer: 59

## 2024-01-04 VITALS — BP 140/70 | HR 57 | Temp 97.9°F | Ht 64.0 in | Wt 228.0 lb

## 2024-01-04 DIAGNOSIS — Z0000 Encounter for general adult medical examination without abnormal findings: Secondary | ICD-10-CM | POA: Diagnosis not present

## 2024-01-04 NOTE — Patient Instructions (Signed)
 Leslie Davis,  Thank you for taking the time for your Medicare Wellness Visit. I appreciate your continued commitment to your health goals. Please review the care plan we discussed, and feel free to reach out if I can assist you further.  Medicare recommends these wellness visits once per year to help you and your care team stay ahead of potential health issues. These visits are designed to focus on prevention, allowing your provider to concentrate on managing your acute and chronic conditions during your regular appointments.  Please note that Annual Wellness Visits do not include a physical exam. Some assessments may be limited, especially if the visit was conducted virtually. If needed, we may recommend a separate in-person follow-up with your provider.  Ongoing Care Seeing your primary care provider every 3 to 6 months helps us  monitor your health and provide consistent, personalized care.   Referrals If a referral was made during today's visit and you haven't received any updates within two weeks, please contact the referred provider directly to check on the status.  Recommended Screenings:  Health Maintenance  Topic Date Due   Eye exam for diabetics  03/10/2023   COVID-19 Vaccine (3 - Moderna risk series) 01/20/2024*   Zoster (Shingles) Vaccine (1 of 2) 03/06/2024*   Flu Shot  06/19/2024*   DTaP/Tdap/Td vaccine (3 - Tdap) 07/05/2024*   Pneumococcal Vaccine for age over 65 (1 of 2 - PCV) 07/05/2024*   Hemoglobin A1C  01/05/2024   Yearly kidney function blood test for diabetes  07/05/2024   Yearly kidney health urinalysis for diabetes  07/05/2024   Complete foot exam   07/05/2024   Colon Cancer Screening  11/24/2024   Medicare Annual Wellness Visit  01/03/2025   Breast Cancer Screening  04/13/2025   DEXA scan (bone density measurement)  Completed   Hepatitis C Screening  Completed   Meningitis B Vaccine  Aged Out  *Topic was postponed. The date shown is not the original due  date.       01/04/2024   12:10 PM  Advanced Directives  Does Patient Have a Medical Advance Directive? No  Would patient like information on creating a medical advance directive? No - Patient declined   Advance Care Planning is important because it: Ensures you receive medical care that aligns with your values, goals, and preferences. Provides guidance to your family and loved ones, reducing the emotional burden of decision-making during critical moments.  Vision: Annual vision screenings are recommended for early detection of glaucoma, cataracts, and diabetic retinopathy. These exams can also reveal signs of chronic conditions such as diabetes and high blood pressure.  Dental: Annual dental screenings help detect early signs of oral cancer, gum disease, and other conditions linked to overall health, including heart disease and diabetes.  Please see the attached documents for additional preventive care recommendations.

## 2024-01-04 NOTE — Progress Notes (Signed)
 Subjective:   Leslie Davis is a 68 y.o. who presents for a Medicare Wellness preventive visit.  As a reminder, Annual Wellness Visits don't include a physical exam, and some assessments may be limited, especially if this visit is performed virtually. We may recommend an in-person follow-up visit with your provider if needed.  Visit Complete: In person    Persons Participating in Visit: Patient.  AWV Questionnaire: No: Patient Medicare AWV questionnaire was not completed prior to this visit.  Cardiac Risk Factors include: advanced age (>66men, >30 women);diabetes mellitus;dyslipidemia;hypertension;obesity (BMI >30kg/m2)     Objective:    Today's Vitals   01/04/24 1203 01/04/24 1217  BP: (!) 142/70 (!) 140/70  Pulse: (!) 57   Temp: 97.9 F (36.6 C)   TempSrc: Oral   SpO2: 98%   Weight: 228 lb (103.4 kg)   Height: 5' 4 (1.626 m)    Body mass index is 39.14 kg/m.     01/04/2024   12:10 PM 12/23/2022   12:19 PM 07/28/2022    9:38 AM 02/05/2022   11:28 AM 01/29/2022   12:51 PM 12/31/2021    9:27 AM 12/19/2021    1:38 PM  Advanced Directives  Does Patient Have a Medical Advance Directive? No No No No No No No  Would patient like information on creating a medical advance directive? No - Patient declined  No - Patient declined No - Patient declined No - Patient declined No - Patient declined Yes (ED - Information included in AVS)    Current Medications (verified) Outpatient Encounter Medications as of 01/04/2024  Medication Sig   Accu-Chek FastClix Lancets MISC USE AS NEEDED TO CHECK BLOOD SUGARS.   acetaminophen  (TYLENOL ) 500 MG tablet Take 1 tablet (500 mg total) by mouth every 6 (six) hours as needed for mild pain.   albuterol  (VENTOLIN  HFA) 108 (90 Base) MCG/ACT inhaler Inhale 2 puffs into the lungs every 6 (six) hours as needed for wheezing or shortness of breath.   amLODipine  (NORVASC ) 10 MG tablet TAKE 1 TABLET BY MOUTH ONCE DAILY   ASPIRIN  LOW DOSE 81 MG  tablet TAKE 1 TABLET BY MOUTH ONCE DAILY *SWALLOW WHOLE*   Blood Glucose Monitoring Suppl (ONETOUCH VERIO) w/Device KIT Use as directed to check blood sugars 2 times per day dx: e11.65   celecoxib  (CELEBREX ) 200 MG capsule Take 1 capsule (200 mg total) by mouth 2 (two) times daily as needed.   cetirizine  (ALLERGY RELIEF CETIRIZINE ) 10 MG tablet TAKE 1 TABLET BY MOUTH ONCE DAILY   clindamycin (CLEOCIN T) 1 % lotion Apply 1 Application topically daily as needed (acne).   Continuous Glucose Receiver (FREESTYLE LIBRE 3 READER) DEVI Use to check glucose continuously   Continuous Glucose Sensor (FREESTYLE LIBRE 3 PLUS SENSOR) MISC Change sensor every 15 days.   EDARBI  80 MG TABS TAKE 1 TABLET BY MOUTH ONCE DAILY   ezetimibe  (ZETIA ) 10 MG tablet TAKE 1 TABLET BY MOUTH ONCE DAILY *PATIENT MUST SCHEDULE AN OVERDUE FOLLOW UP APPOINTMENT WITH CARDIOLOGY FOR ANY MORE REFILLS [phone]*   FARXIGA  10 MG TABS tablet TAKE 1 TABLET BY MOUTH DAILY BEFORE BREAKFAST   ferrous gluconate  (FERGON) 324 MG tablet TAKE 1 TABLET BY MOUTH TWICE DAILY WITH A MEAL.   glucose blood (ACCU-CHEK GUIDE TEST) test strip USE ONCE DAILY TO CHECK BLOOD SUGAR.   glucose blood (ONETOUCH VERIO) test strip USE TO TEST BLOOD SUGAR TWICE DAILY AS DIRECTED   insulin  lispro (HUMALOG ) 100 UNIT/ML KwikPen Inject 0-6 Units into the  skin 3 (three) times daily with meals. Inject up to 3 times daily with meals per sliding scale (70-150: 2 units, 150-199: 3 units, 200-249: 4 units, 250-299: 5 units, 300-349: 6 units)   Insulin  Pen Needle (PEN NEEDLES) 31G X 5 MM MISC 1 each by Does not apply route 3 (three) times daily. May dispense any manufacturer covered by patient's insurance.   linaclotide  ([name] ) 145 MCG CAPS capsule Take 1 capsule (145 mcg total) by mouth daily.   metoprolol  succinate (TOPROL  XL) 25 MG 24 hr tablet Take 0.5 tablets (12.5 mg total) by mouth daily.   nitroGLYCERIN  (NITROSTAT ) 0.4 MG SL tablet Place 1 tablet (0.4 mg total)  under the tongue every 5 (five) minutes as needed for chest pain. If you require more than two tablets five minutes apart go to the nearest ER via EMS.   ondansetron  (ZOFRAN ) 4 MG tablet TAKE 1 TABLET BY MOUTH EVERY 8 HOURS AS NEEDED FOR NAUSEA & VOMITING   OneTouch Delica Lancets 33G MISC Use as directed to check blood sugars 2 times per day dx:e11.65   pantoprazole  (PROTONIX ) 40 MG tablet TAKE ONE (1) TABLET BY MOUTH TWICE DAILY   polyethylene glycol (MIRALAX  / GLYCOLAX ) 17 g packet Take 17 g by mouth daily as needed for mild constipation.   rosuvastatin  (CRESTOR ) 40 MG tablet Take 1 tablet (40 mg total) by mouth daily.   temazepam  (RESTORIL ) 30 MG capsule TAKE 1 CAPSULE BY MOUTH AT BEDTIME AS NEEDED *REFILL REQUEST*   topiramate  (TOPAMAX ) 25 MG tablet 1/2 tab bid   traMADol  (ULTRAM ) 50 MG tablet Take 1 tablet (50 mg total) by mouth 2 (two) times daily as needed.   TRESIBA  FLEXTOUCH 100 UNIT/ML FlexTouch Pen Inject 24 units daily. Max daily dose 30 units.   TRUEPLUS PEN NEEDLES 31G X 6 MM MISC USE WITH pen TO INJECT insulin  DAILY   azithromycin  (ZITHROMAX ) 250 MG tablet Take 2 tablets (500 mg) on  Day 1,  followed by 1 tablet (250 mg) once daily on Days 2 through 5. (Patient not taking: Reported on 01/04/2024)   fluticasone  (FLONASE ) 50 MCG/ACT nasal spray Place 2 sprays into both nostrils daily. (Patient not taking: Reported on 01/04/2024)   methylPREDNISolone  (MEDROL  DOSEPAK) 4 MG TBPK tablet Take as directed. (Patient not taking: Reported on 01/04/2024)   No facility-administered encounter medications on file as of 01/04/2024.    Allergies (verified) Fiasp  [insulin  aspart (w-niacinamide)]   History: Past Medical History:  Diagnosis Date   CKD (chronic kidney disease)    stage 3   Coronary artery disease 07/2021   obstructive CAD-70% LAD-treated medically   DM (diabetes mellitus) (HCC)    Fatty liver disease, nonalcoholic    Gastritis    Gastroparesis    GERD (gastroesophageal  reflux disease)    Heartburn    Hiatal hernia    HLD (hyperlipidemia)    HTN (hypertension)    Joint pain    Renal calculus    Rheumatoid arthritis (HCC)    Vitamin D  deficiency    Past Surgical History:  Procedure Laterality Date   BREAST BIOPSY  02/04/2022   MM LT RADIOACTIVE SEED EA ADD LESION LOC MAMMO GUIDE 02/04/2022 [facility]   BREAST BIOPSY  02/04/2022   MM LT RADIOACTIVE SEED LOC MAMMO GUIDE 02/04/2022 [facility]   BREAST LUMPECTOMY WITH RADIOACTIVE SEED LOCALIZATION Left 02/05/2022   Procedure: LEFT BREAST BRACKETED LUMPECTOMY WITH RADIOACTIVE SEED LOCALIZATION;  Surgeon: Curvin Deward MOULD, MD;  Location: [facility];  Service: General;  Laterality: Left;   CHOLECYSTECTOMY     COLONOSCOPY  09/10/2011   normal   JOINT REPLACEMENT  2022   left TKA   LAPAROSCOPIC REMOVAL ABDOMINAL MASS N/A 08/05/2022   Procedure: LAPAROSCOPIC ASSISTED EXCISION OF MESENTERIC MASS;  Surgeon: Dasie Leonor CROME, MD;  Location: [facility];  Service: General;  Laterality: N/A;   LAPAROSCOPIC SMALL BOWEL RESECTION N/A 08/05/2022   Procedure: LAPAROSCOPIC SMALL BOWEL RESECTION;  Surgeon: Dasie Leonor CROME, MD;  Location: [facility];  Service: General;  Laterality: N/A;   LEFT HEART CATH AND CORONARY ANGIOGRAPHY N/A 08/18/2021   Procedure: LEFT HEART CATH AND CORONARY ANGIOGRAPHY;  Surgeon: Elmira Newman PARAS, MD;  Location: [facility];  Service: Cardiovascular;  Laterality: N/A;   TOTAL KNEE ARTHROPLASTY Left 10/27/2020   Procedure: LEFT TOTAL KNEE ARTHROPLASTY;  Surgeon: Jerri Kay HERO, MD;  Location: [facility];  Service: Orthopedics;  Laterality: Left;   TUBAL LIGATION     UPPER GASTROINTESTINAL ENDOSCOPY     Family History  Problem Relation Age of Onset   Diabetes Mother    Thyroid  disease Mother    Kidney disease Mother    Hypertension Father    Hyperlipidemia Father    Sudden death Father    Stroke Father    Diabetes Sister    Heart disease Sister    Kidney disease  Sister    COPD Sister    Heart disease Brother 73   Diabetes Brother    Stomach cancer Maternal Aunt        X 2 aunts   Diabetes Maternal Grandmother    Colon cancer Neg Hx    Esophageal cancer Neg Hx    Rectal cancer Neg Hx    Colon polyps Neg Hx    Social History   Socioeconomic History   Marital status: Married    Spouse name: Jada Fass   Number of children: 2   Years of education: Not on file   Highest education level: Not on file  Occupational History   Occupation: Housekeeping    Comment: Magazine features editor Service  Tobacco Use   Smoking status: Former    Current packs/day: 0.00    Average packs/day: 0.3 packs/day for 1 year (0.3 ttl pk-yrs)    Types: Cigarettes    Start date: 09/09/1973    Quit date: 09/10/1974    Years since quitting: 49.3   Smokeless tobacco: Never   Tobacco comments:    she no longer smokes.   Vaping Use   Vaping status: Never Used  Substance and Sexual Activity   Alcohol use: Never   Drug use: Never   Sexual activity: Not Currently    Birth control/protection: Post-menopausal  Other Topics Concern   Not on file  Social History Narrative   Lives with husband.  Part time job.    Social Drivers of Corporate investment banker Strain: Low Risk  (01/04/2024)   Overall Financial Resource Strain (CARDIA)    Difficulty of Paying Living Expenses: Not hard at all  Food Insecurity: No Food Insecurity (01/04/2024)   Hunger Vital Sign    Worried About Running Out of Food in the Last Year: Never true    Ran Out of Food in the Last Year: Never true  Transportation Needs: No Transportation Needs (01/04/2024)   PRAPARE - Administrator, Civil Service (Medical): No    Lack of Transportation (Non-Medical): No  Physical Activity: Insufficiently Active (01/04/2024)   Exercise Vital Sign  Days of Exercise per Week: 2 days    Minutes of Exercise per Session: 60 min  Stress: No Stress Concern Present (01/04/2024)   [employer] of  Occupational Health - Occupational Stress Questionnaire    Feeling of Stress: Not at all  Social Connections: Moderately Integrated (01/04/2024)   Social Connection and Isolation Panel    Frequency of Communication with Friends and Family: More than three times a week    Frequency of Social Gatherings with Friends and Family: Once a week    Attends Religious Services: More than 4 times per year    Active Member of [employer] or Organizations: No    Attends Banker Meetings: Never    Marital Status: Married    Tobacco Counseling Counseling given: Not Answered Tobacco comments: she no longer smokes.     Clinical Intake:  Pre-visit preparation completed: Yes  Pain : No/denies pain     Nutritional Status: BMI > 30  Obese Nutritional Risks: None Diabetes: Yes CBG done?: No Did pt. bring in CBG monitor from home?: No  Lab Results  Component Value Date   HGBA1C 8.3 (H) 07/06/2023   HGBA1C 10.4 (H) 04/07/2023   HGBA1C 8.9 (H) 01/04/2023     How often do you need to have someone help you when you read instructions, pamphlets, or other written materials from your doctor or pharmacy?: 1 - Never  Interpreter Needed?: No  Information entered by :: NAllen LPN   Activities of Daily Living     01/04/2024   12:04 PM  In your present state of health, do you have any difficulty performing the following activities:  Hearing? 0  Vision? 0  Difficulty concentrating or making decisions? 0  Walking or climbing stairs? 1  Comment due to knee  Dressing or bathing? 0  Doing errands, shopping? 0  Preparing Food and eating ? N  Using the Toilet? N  In the past six months, have you accidently leaked urine? N  Do you have problems with loss of bowel control? N  Managing your Medications? N  Managing your Finances? N  Housekeeping or managing your Housekeeping? N    Patient Care Team: Jarold Medici, MD as PCP - General (Internal Medicine) Michele Richardson, DO as PCP -  Cardiology (Cardiology) Pearson, Vallie J, RPH (Inactive) (Pharmacist) Pa, [facility]  I have updated your Care Teams any recent Medical Services you may have received from other providers in the past year.     Assessment:   This is a routine wellness examination for Danya.  Hearing/Vision screen Hearing Screening - Comments:: Denies hearing issues Vision Screening - Comments:: Regular eye exams, [facility]   Goals Addressed             This Visit's Progress    Patient Stated       01/04/2024, wants to lose weight       Depression Screen     01/04/2024   12:11 PM 12/06/2023   10:48 AM 01/04/2023    9:44 AM 12/23/2022   12:21 PM 10/07/2022   10:13 AM 08/24/2022    9:21 AM 07/05/2022    8:55 AM  PHQ 2/9 Scores  PHQ - 2 Score 0 0 0 0 0 0 0  PHQ- 9 Score   0 0 0 0 0    Fall Risk     01/04/2024   12:11 PM 12/06/2023   10:48 AM 01/04/2023    9:44 AM 12/23/2022   12:20 PM  10/07/2022   10:13 AM  Fall Risk   Falls in the past year? 0 0 0 0 0  Number falls in past yr: 0 0 0 0 0  Injury with Fall? 0 0 0 0 0  Risk for fall due to : Medication side effect No Fall Risks No Fall Risks Medication side effect No Fall Risks  Follow up Falls evaluation completed;Falls prevention discussed Falls evaluation completed Falls evaluation completed Falls prevention discussed;Falls evaluation completed Falls evaluation completed    MEDICARE RISK AT HOME:  Medicare Risk at Home Any stairs in or around the home?: Yes If so, are there any without handrails?: No Home free of loose throw rugs in walkways, pet beds, electrical cords, etc?: Yes Adequate lighting in your home to reduce risk of falls?: Yes Life alert?: No Use of a cane, walker or w/c?: No Grab bars in the bathroom?: No Shower chair or bench in shower?: No Elevated toilet seat or a handicapped toilet?: Yes  TIMED UP AND GO:  Was the test performed?  Yes  Length of time to ambulate 10 feet: 5  sec Gait steady and fast without use of assistive device  Cognitive Function: 6CIT completed        01/04/2024   12:12 PM 12/23/2022   12:21 PM 12/19/2021    1:40 PM 12/17/2020    9:11 AM 11/15/2019    9:36 AM  6CIT Screen  What Year? 0 points 0 points 0 points 0 points 0 points  What month? 0 points 0 points 0 points 0 points 0 points  What time? 0 points 0 points 0 points 0 points 0 points  Count back from 20 0 points 0 points  0 points 0 points  Months in reverse 0 points 2 points  0 points 0 points  Repeat phrase 0 points 2 points 6 points 2 points 2 points  Total Score 0 points 4 points  2 points 2 points    Immunizations Immunization History  Administered Date(s) Administered   DTaP 03/26/2013   Fluad Quad(high Dose 65+) 11/08/2018, 02/21/2020, 02/26/2021   Fluad Trivalent(High Dose 65+) 01/04/2023   INFLUENZA, HIGH DOSE SEASONAL PF 11/08/2018   Influenza-Unspecified 11/30/2017   Moderna Sars-Covid-2 Vaccination 08/03/2019, 08/31/2019   Td 08/20/2005    Screening Tests Health Maintenance  Topic Date Due   OPHTHALMOLOGY EXAM  03/10/2023   COVID-19 Vaccine (3 - Moderna risk series) 01/20/2024 (Originally 09/28/2019)   Zoster Vaccines- Shingrix (1 of 2) 03/06/2024 (Originally 10/25/1974)   Influenza Vaccine  06/19/2024 (Originally 10/21/2023)   DTaP/Tdap/Td (3 - Tdap) 07/05/2024 (Originally 03/27/2023)   Pneumococcal Vaccine: 50+ Years (1 of 2 - PCV) 07/05/2024 (Originally 10[birth year]25[birth year]1972)   HEMOGLOBIN A1C  01/05/2024   Diabetic kidney evaluation - eGFR measurement  07/05/2024   Diabetic kidney evaluation - Urine ACR  07/05/2024   FOOT EXAM  07/05/2024   Colonoscopy  11/24/2024   Medicare Annual Wellness (AWV)  01/03/2025   Mammogram  04/13/2025   DEXA SCAN  Completed   Hepatitis C Screening  Completed   Meningococcal B Vaccine  Aged Out    Health Maintenance Items Addressed: Declines flu and covid vaccine  Additional Screening:  Vision Screening: Recommended annual  ophthalmology exams for early detection of glaucoma and other disorders of the eye. Is the patient up to date with their annual eye exam?  Yes  Who is the provider or what is the name of the office in which the patient attends annual eye exams? [facility]  Dental Screening: Recommended annual dental exams for proper oral hygiene  Community Resource Referral / Chronic Care Management: CRR required this visit?  No   CCM required this visit?  No   Plan:    I have personally reviewed and noted the following in the patient's chart:   Medical and social history Use of alcohol, tobacco or illicit drugs  Current medications and supplements including opioid prescriptions. Patient is not currently taking opioid prescriptions. Functional ability and status Nutritional status Physical activity Advanced directives List of other physicians Hospitalizations, surgeries, and ER visits in previous 12 months Vitals Screenings to include cognitive, depression, and falls Referrals and appointments  In addition, I have reviewed and discussed with patient certain preventive protocols, quality metrics, and best practice recommendations. A written personalized care plan for preventive services as well as general preventive health recommendations were provided to patient.   Ardella FORBES [name], LPN   [id number]   After Visit Summary: (In Person-Printed) AVS printed and given to the patient  Notes: Nothing significant to report at this time.

## 2024-01-05 ENCOUNTER — Encounter: Payer: Self-pay | Admitting: Internal Medicine

## 2024-01-05 ENCOUNTER — Ambulatory Visit: Admitting: Internal Medicine

## 2024-01-05 ENCOUNTER — Other Ambulatory Visit: Payer: Self-pay

## 2024-01-05 VITALS — BP 122/84 | HR 59 | Temp 97.4°F | Ht 64.0 in | Wt 226.6 lb

## 2024-01-05 DIAGNOSIS — I1310 Hypertensive heart and chronic kidney disease without heart failure, with stage 1 through stage 4 chronic kidney disease, or unspecified chronic kidney disease: Secondary | ICD-10-CM | POA: Diagnosis not present

## 2024-01-05 DIAGNOSIS — Z8249 Family history of ischemic heart disease and other diseases of the circulatory system: Secondary | ICD-10-CM | POA: Diagnosis not present

## 2024-01-05 DIAGNOSIS — I25118 Atherosclerotic heart disease of native coronary artery with other forms of angina pectoris: Secondary | ICD-10-CM

## 2024-01-05 DIAGNOSIS — E1122 Type 2 diabetes mellitus with diabetic chronic kidney disease: Secondary | ICD-10-CM

## 2024-01-05 DIAGNOSIS — M791 Myalgia, unspecified site: Secondary | ICD-10-CM

## 2024-01-05 DIAGNOSIS — R052 Subacute cough: Secondary | ICD-10-CM | POA: Diagnosis not present

## 2024-01-05 DIAGNOSIS — Z794 Long term (current) use of insulin: Secondary | ICD-10-CM

## 2024-01-05 DIAGNOSIS — N1831 Chronic kidney disease, stage 3a: Secondary | ICD-10-CM

## 2024-01-05 DIAGNOSIS — I739 Peripheral vascular disease, unspecified: Secondary | ICD-10-CM

## 2024-01-05 DIAGNOSIS — I2510 Atherosclerotic heart disease of native coronary artery without angina pectoris: Secondary | ICD-10-CM

## 2024-01-05 DIAGNOSIS — E1169 Type 2 diabetes mellitus with other specified complication: Secondary | ICD-10-CM

## 2024-01-05 DIAGNOSIS — E7800 Pure hypercholesterolemia, unspecified: Secondary | ICD-10-CM

## 2024-01-05 DIAGNOSIS — E1151 Type 2 diabetes mellitus with diabetic peripheral angiopathy without gangrene: Secondary | ICD-10-CM | POA: Diagnosis not present

## 2024-01-05 MED ORDER — EZETIMIBE 10 MG PO TABS: ORAL_TABLET | ORAL | 3 refills | Status: AC

## 2024-01-05 MED ORDER — ROSUVASTATIN CALCIUM 40 MG PO TABS: 40.0000 mg | ORAL_TABLET | Freq: Every day | ORAL | 2 refills | Status: DC

## 2024-01-05 NOTE — Patient Instructions (Addendum)
 Delsym cough syrup  Hypertension, Adult Hypertension is another name for high blood pressure. High blood pressure forces your heart to work harder to pump blood. This can cause problems over time. There are two numbers in a blood pressure reading. There is a top number (systolic) over a bottom number (diastolic). It is best to have a blood pressure that is below 120/80. What are the causes? The cause of this condition is not known. Some other conditions can lead to high blood pressure. What increases the risk? Some lifestyle factors can make you more likely to develop high blood pressure: Smoking. Not getting enough exercise or physical activity. Being overweight. Having too much fat, sugar, calories, or salt (sodium) in your diet. Drinking too much alcohol. Other risk factors include: Having any of these conditions: Heart disease. Diabetes. High cholesterol. Kidney disease. Obstructive sleep apnea. Having a family history of high blood pressure and high cholesterol. Age. The risk increases with age. Stress. What are the signs or symptoms? High blood pressure may not cause symptoms. Very high blood pressure (hypertensive crisis) may cause: Headache. Fast or uneven heartbeats (palpitations). Shortness of breath. Nosebleed. Vomiting or feeling like you may vomit (nauseous). Changes in how you see. Very bad chest pain. Feeling dizzy. Seizures. How is this treated? This condition is treated by making healthy lifestyle changes, such as: Eating healthy foods. Exercising more. Drinking less alcohol. Your doctor may prescribe medicine if lifestyle changes do not help enough and if: Your top number is above 130. Your bottom number is above 80. Your personal target blood pressure may vary. Follow these instructions at home: Eating and drinking  If told, follow the DASH eating plan. To follow this plan: Fill one half of your plate at each meal with fruits and vegetables. Fill  one fourth of your plate at each meal with whole grains. Whole grains include whole-wheat pasta, brown rice, and whole-grain bread. Eat or drink low-fat dairy products, such as skim milk or low-fat yogurt. Fill one fourth of your plate at each meal with low-fat (lean) proteins. Low-fat proteins include fish, chicken without skin, eggs, beans, and tofu. Avoid fatty meat, cured and processed meat, or chicken with skin. Avoid pre-made or processed food. Limit the amount of salt in your diet to less than 1,500 mg each day. Do not drink alcohol if: Your doctor tells you not to drink. You are pregnant, may be pregnant, or are planning to become pregnant. If you drink alcohol: Limit how much you have to: 0-1 drink a day for women. 0-2 drinks a day for men. Know how much alcohol is in your drink. In the [place] one drink equals one 12 oz bottle of beer (355 mL), one 5 oz glass of wine (148 mL), or one 1 oz glass of hard liquor (44 mL). Lifestyle  Work with your doctor to stay at a healthy weight or to lose weight. Ask your doctor what the best weight is for you. Get at least 30 minutes of exercise that causes your heart to beat faster (aerobic exercise) most days of the week. This may include walking, swimming, or biking. Get at least 30 minutes of exercise that strengthens your muscles (resistance exercise) at least 3 days a week. This may include lifting weights or doing Pilates. Do not smoke or use any products that contain nicotine or tobacco. If you need help quitting, ask your doctor. Check your blood pressure at home as told by your doctor. Keep all follow-up visits. Medicines Take  over-the-counter and prescription medicines only as told by your doctor. Follow directions carefully. Do not skip doses of blood pressure medicine. The medicine does not work as well if you skip doses. Skipping doses also puts you at risk for problems. Ask your doctor about side effects or reactions to medicines  that you should watch for. Contact a doctor if: You think you are having a reaction to the medicine you are taking. You have headaches that keep coming back. You feel dizzy. You have swelling in your ankles. You have trouble with your vision. Get help right away if: You get a very bad headache. You start to feel mixed up (confused). You feel weak or numb. You feel faint. You have very bad pain in your: Chest. Belly (abdomen). You vomit more than once. You have trouble breathing. These symptoms may be an emergency. Get help right away. Call 911. Do not wait to see if the symptoms will go away. Do not drive yourself to the hospital. Summary Hypertension is another name for high blood pressure. High blood pressure forces your heart to work harder to pump blood. For most people, a normal blood pressure is less than 120/80. Making healthy choices can help lower blood pressure. If your blood pressure does not get lower with healthy choices, you may need to take medicine. This information is not intended to replace advice given to you by your health care provider. Make sure you discuss any questions you have with your health care provider. Document Revised: 12/25/2020 Document Reviewed: 12/25/2020 Elsevier Patient Education  2024 [employer].

## 2024-01-05 NOTE — Progress Notes (Signed)
 I,Victoria T Emmitt, CMA,acting as a Neurosurgeon for Catheryn LOISE Slocumb, MD.,have documented all relevant documentation on the behalf of Catheryn LOISE Slocumb, MD,as directed by  Catheryn LOISE Slocumb, MD while in the presence of Catheryn LOISE Slocumb, MD.  Subjective:  Patient ID: Leslie Davis , female    DOB: 1955/11/09 , 68 y.o.   MRN: [id number]  Chief Complaint  Patient presents with   Hypertension    Patient presents today for a bp  follow up, Patient reports compliance with medication. Patient denies any chest pain, SOB, or headaches. She reports still having cough. Ongoing for a month. Denies fever/chills.    Diabetes   Hyperlipidemia    HPI Discussed the use of AI scribe software for clinical note transcription with the patient, who gave verbal consent to proceed.  History of Present Illness Pauleen Goleman Cythina Mickelsen is a 68 year old female with hypertension and diabetes who presents for a blood pressure check and evaluation of a persistent cough.  She has been experiencing a persistent dry cough since her last visit in September, initially following a cold. The cough is more pronounced at night. She has been taking cetirizine  for allergies but has not used Flonase  recently. She has not tried Delsym cough syrup.  She is currently on multiple medications including amlodipine  10 mg for hypertension, Edarbi  for blood pressure, Zetia  for cholesterol, Farxiga  for diabetes, and Tresiba  24 units at night. She also takes aspirin , metoprolol  (half a tablet daily), and pantoprazole  once daily for reflux. She uses [name]  as needed for bowel issues.  Her blood sugar was 140 mg/dL this morning, which is not typical for her. She attributes a recent high reading to eating two hot dogs the previous night. She has not had her A1c checked since May and is due for a follow-up in December.  She experiences muscle cramps in her legs and stomach, which she attributes to possibly drinking too much water . She drinks a  lot of water  and eats fruit and vegetables mostly every day. She exercises regularly, doing aerobics and walking, but experiences tiredness in her legs, possibly due to a knee problem.  She reports shortness of breath and fatigue during exercise. She has a family history of heart disease, with her grandmother affected. She has a history of smoking but quit after less than a year.   Diabetes She presents for her follow-up diabetic visit. She has type 2 diabetes mellitus. Her disease course has been stable. Pertinent negatives for diabetes include no blurred vision, no polydipsia, no polyphagia and no polyuria. There are no hypoglycemic complications. Diabetic complications include nephropathy. Risk factors for coronary artery disease include diabetes mellitus, dyslipidemia, hypertension and post-menopausal. She participates in exercise intermittently. An ACE inhibitor/angiotensin II receptor blocker is being taken. Eye exam is current.  Hypertension This is a chronic problem. The current episode started more than 1 year ago. The problem has been gradually improving since onset. Pertinent negatives include no blurred vision. Risk factors for coronary artery disease include dyslipidemia and diabetes mellitus. The current treatment provides moderate improvement.     Past Medical History:  Diagnosis Date   CKD (chronic kidney disease)    stage 3   Coronary artery disease 07/2021   obstructive CAD-70% LAD-treated medically   DM (diabetes mellitus) (HCC)    Fatty liver disease, nonalcoholic    Gastritis    Gastroparesis    GERD (gastroesophageal reflux disease)    Heartburn    Hiatal hernia  HLD (hyperlipidemia)    HTN (hypertension)    Joint pain    Renal calculus    Rheumatoid arthritis (HCC)    Vitamin D  deficiency      Family History  Problem Relation Age of Onset   Diabetes Mother    Thyroid  disease Mother    Kidney disease Mother    Hypertension Father    Hyperlipidemia Father     Sudden death Father    Stroke Father    Diabetes Sister    Heart disease Sister    Kidney disease Sister    COPD Sister    Heart disease Brother 64   Diabetes Brother    Stomach cancer Maternal Aunt        X 2 aunts   Diabetes Maternal Grandmother    Colon cancer Neg Hx    Esophageal cancer Neg Hx    Rectal cancer Neg Hx    Colon polyps Neg Hx      Current Outpatient Medications:    Accu-Chek FastClix Lancets MISC, USE AS NEEDED TO CHECK BLOOD SUGARS., Disp: 100 each, Rfl: 2   acetaminophen  (TYLENOL ) 500 MG tablet, Take 1 tablet (500 mg total) by mouth every 6 (six) hours as needed for mild pain., Disp: 30 tablet, Rfl: 0   albuterol  (VENTOLIN  HFA) 108 (90 Base) MCG/ACT inhaler, Inhale 2 puffs into the lungs every 6 (six) hours as needed for wheezing or shortness of breath., Disp: 17 g, Rfl: 2   amLODipine  (NORVASC ) 10 MG tablet, TAKE 1 TABLET BY MOUTH ONCE DAILY, Disp: 330 tablet, Rfl: 11   ASPIRIN  LOW DOSE 81 MG tablet, TAKE 1 TABLET BY MOUTH ONCE DAILY *SWALLOW WHOLE*, Disp: 30 tablet, Rfl: 10   Blood Glucose Monitoring Suppl (ONETOUCH VERIO) w/Device KIT, Use as directed to check blood sugars 2 times per day dx: e11.65, Disp: 1 kit, Rfl: 1   celecoxib  (CELEBREX ) 200 MG capsule, Take 1 capsule (200 mg total) by mouth 2 (two) times daily as needed., Disp: 180 capsule, Rfl: 2   cetirizine  (ALLERGY RELIEF CETIRIZINE ) 10 MG tablet, TAKE 1 TABLET BY MOUTH ONCE DAILY, Disp: 90 tablet, Rfl: 1   clindamycin (CLEOCIN T) 1 % lotion, Apply 1 Application topically daily as needed (acne)., Disp: , Rfl:    Continuous Glucose Receiver (FREESTYLE LIBRE 3 READER) DEVI, Use to check glucose continuously, Disp: 1 each, Rfl: 0   Continuous Glucose Sensor (FREESTYLE LIBRE 3 PLUS SENSOR) MISC, Change sensor every 15 days., Disp: 6 each, Rfl: 1   EDARBI  80 MG TABS, TAKE 1 TABLET BY MOUTH ONCE DAILY, Disp: 30 tablet, Rfl: 10   FARXIGA  10 MG TABS tablet, TAKE 1 TABLET BY MOUTH DAILY BEFORE BREAKFAST,  Disp: 30 tablet, Rfl: 10   ferrous gluconate  (FERGON) 324 MG tablet, TAKE 1 TABLET BY MOUTH TWICE DAILY WITH A MEAL., Disp: 100 tablet, Rfl: 0   glucose blood (ACCU-CHEK GUIDE TEST) test strip, USE ONCE DAILY TO CHECK BLOOD SUGAR., Disp: 100 each, Rfl: 12   glucose blood (ONETOUCH VERIO) test strip, USE TO TEST BLOOD SUGAR TWICE DAILY AS DIRECTED, Disp: 100 each, Rfl: 10   insulin  lispro (HUMALOG ) 100 UNIT/ML KwikPen, Inject 0-6 Units into the skin 3 (three) times daily with meals. Inject up to 3 times daily with meals per sliding scale (70-150: 2 units, 150-199: 3 units, 200-249: 4 units, 250-299: 5 units, 300-349: 6 units), Disp: 15 mL, Rfl: 3   Insulin  Pen Needle (PEN NEEDLES) 31G X 5 MM MISC, 1 each by  Does not apply route 3 (three) times daily. May dispense any manufacturer covered by patient's insurance., Disp: 100 each, Rfl: 0   linaclotide  ([name] ) 145 MCG CAPS capsule, Take 1 capsule (145 mcg total) by mouth daily., Disp: 30 capsule, Rfl: 0   metoprolol  succinate (TOPROL  XL) 25 MG 24 hr tablet, Take 0.5 tablets (12.5 mg total) by mouth daily., Disp: 30 tablet, Rfl: 3   nitroGLYCERIN  (NITROSTAT ) 0.4 MG SL tablet, Place 1 tablet (0.4 mg total) under the tongue every 5 (five) minutes as needed for chest pain. If you require more than two tablets five minutes apart go to the nearest ER via EMS., Disp: 30 tablet, Rfl: 0   ondansetron  (ZOFRAN ) 4 MG tablet, TAKE 1 TABLET BY MOUTH EVERY 8 HOURS AS NEEDED FOR NAUSEA & VOMITING, Disp: 20 tablet, Rfl: 10   OneTouch Delica Lancets 33G MISC, Use as directed to check blood sugars 2 times per day dx:e11.65, Disp: 150 each, Rfl: 3   pantoprazole  (PROTONIX ) 40 MG tablet, TAKE ONE (1) TABLET BY MOUTH TWICE DAILY, Disp: 60 tablet, Rfl: 11   polyethylene glycol (MIRALAX  / GLYCOLAX ) 17 g packet, Take 17 g by mouth daily as needed for mild constipation., Disp: 14 each, Rfl: 0   temazepam  (RESTORIL ) 30 MG capsule, TAKE 1 CAPSULE BY MOUTH AT BEDTIME AS NEEDED *REFILL  REQUEST*, Disp: 30 capsule, Rfl: 0   topiramate  (TOPAMAX ) 25 MG tablet, 1/2 tab bid, Disp: 30 tablet, Rfl: 1   traMADol  (ULTRAM ) 50 MG tablet, Take 1 tablet (50 mg total) by mouth 2 (two) times daily as needed., Disp: 30 tablet, Rfl: 0   TRESIBA  FLEXTOUCH 100 UNIT/ML FlexTouch Pen, Inject 24 units daily. Max daily dose 30 units., Disp: 15 mL, Rfl: 1   TRUEPLUS PEN NEEDLES 31G X 6 MM MISC, USE WITH pen TO INJECT insulin  DAILY, Disp: 150 each, Rfl: 3   ezetimibe  (ZETIA ) 10 MG tablet, TAKE 1 TABLET BY MOUTH ONCE DAILY., Disp: 90 tablet, Rfl: 3   fluticasone  (FLONASE ) 50 MCG/ACT nasal spray, Place 2 sprays into both nostrils daily. (Patient not taking: Reported on 01/05/2024), Disp: 16 g, Rfl: 6   methylPREDNISolone  (MEDROL  DOSEPAK) 4 MG TBPK tablet, Take as directed. (Patient not taking: Reported on 01/05/2024), Disp: 21 tablet, Rfl: 0   rosuvastatin  (CRESTOR ) 40 MG tablet, Take 1 tablet (40 mg total) by mouth daily., Disp: 100 tablet, Rfl: 2   Allergies  Allergen Reactions   Fiasp  [Insulin  Aspart (W-Niacinamide)] Hives     Review of Systems  Constitutional: Negative.   Eyes:  Negative for blurred vision.  Respiratory:  Positive for cough.   Cardiovascular: Negative.   Gastrointestinal: Negative.   Endocrine: Negative for polydipsia, polyphagia and polyuria.  Musculoskeletal:  Positive for myalgias.  Neurological: Negative.   Psychiatric/Behavioral: Negative.       Today's Vitals   01/05/24 0832  BP: 122/84  Pulse: (!) 59  Temp: (!) 97.4 F (36.3 C)  SpO2: 98%  Weight: 226 lb 9.6 oz (102.8 kg)  Height: 5' 4 (1.626 m)   Body mass index is 38.9 kg/m.  Wt Readings from Last 3 Encounters:  01/05/24 226 lb 9.6 oz (102.8 kg)  01/04/24 228 lb (103.4 kg)  12/06/23 225 lb (102.1 kg)     Objective:  Physical Exam Vitals and nursing note reviewed.  Constitutional:      Appearance: Normal appearance. She is obese.  HENT:     Head: Normocephalic and atraumatic.  Eyes:      Extraocular Movements:  Extraocular movements intact.  Cardiovascular:     Rate and Rhythm: Normal rate and regular rhythm.     Heart sounds: Normal heart sounds.  Pulmonary:     Effort: Pulmonary effort is normal.     Breath sounds: Normal breath sounds.  Musculoskeletal:     Cervical back: Normal range of motion.  Skin:    General: Skin is warm.  Neurological:     General: No focal deficit present.     Mental Status: She is alert.  Psychiatric:        Mood and Affect: Mood normal.        Behavior: Behavior normal.         Assessment And Plan:  Hypertensive heart and renal disease with renal failure, stage 1 through stage 4 or unspecified chronic kidney disease, without heart failure Assessment & Plan: Chronic, fair control. Goal BP<120/80. She will c/w amlodipine  10mg , metoprolol  daily and Edarbi  80mg  daily.  She is encouraged to follow low sodium diet, make necessary dietary changes and increase daily activity. I will see her in four to six months.    Coronary artery disease of native artery of native heart with stable angina pectoris Assessment & Plan: Calcifications in coronary arteries with previous discussion about potential stent placement to alleviate exercise-induced fatigue and shortness of breath.  LDL goal is less than 70, optimal is less than 55.  - Continue with ASA 81mg  daily, carvedilol  3.125mg  twice daily and rosuvastatin  20mg  daily.  - Discuss shortness of breath and fatigue with cardiologist. - Ensure follow-up with cardiologist regarding potential stent placement.    Orders: -     CMP14+EGFR -     Lipid panel  Type 2 diabetes mellitus with stage 3a chronic kidney disease, with long-term current use of insulin  (HCC) Assessment & Plan: Diabetes management with Farxiga  and Tresiba . Blood sugar levels discussed, with a noted high reading of 140 mg/dL in the morning. Dietary influences on blood sugar levels discussed, particularly processed meats. - Request  records from Dr. Tommas to review A1c results. - Encourage dietary modifications to reduce processed meat intake.  Orders: -     CMP14+EGFR -     Lipid panel -     Hemoglobin A1c -     Protein electrophoresis, serum  Pure hypercholesterolemia Assessment & Plan: Chronic,LDL goal is less than 55.   - Continue with rosuvastatin  40mg  daily.  - Follow heart healthy lifestyle.   Orders: -     CMP14+EGFR -     Lipid panel  Subacute cough Assessment & Plan: Persistent dry cough since last visit. Differential diagnosis includes reflux and sinus drainage. Current management includes cetirizine  for allergies. Discussed potential role of reflux in cough. - Start Flonase  nasal spray. - Add famotidine at night for reflux. - Advise obtaining Delsym cough syrup over the counter. - Reassess if cough persists after treatment.   Myalgia Assessment & Plan: Intermittent muscle cramps, possibly related to electrolyte imbalance from excessive water  intake. Discussed potential need for multivitamin with minerals. Exercise and stretching habits reviewed. - Check magnesium  levels. - Consider multivitamin with minerals. - Encourage stretching exercises to improve circulation.  Orders: -     TSH -     CK -     Magnesium   Claudication -     VAS US  LOWER EXT ART SEG MULTI (SEGMENTALS & LE RAYNAUDS); Future  Family history of heart disease -     Lipoprotein A (LPA)   Return if symptoms worsen or  fail to improve.  Patient was given opportunity to ask questions. Patient verbalized understanding of the plan and was able to repeat key elements of the plan. All questions were answered to their satisfaction.   I, Catheryn LOISE Slocumb, MD, have reviewed all documentation for this visit. The documentation on 01/05/24 for the exam, diagnosis, procedures, and orders are all accurate and complete.   IF YOU HAVE BEEN REFERRED TO A SPECIALIST, IT MAY TAKE 1-2 WEEKS TO SCHEDULE/PROCESS THE REFERRAL. IF YOU HAVE NOT  HEARD FROM US /SPECIALIST IN TWO WEEKS, PLEASE GIVE US  A CALL AT [phone].   THE PATIENT IS ENCOURAGED TO PRACTICE SOCIAL DISTANCING DUE TO THE COVID-19 PANDEMIC.

## 2024-01-08 DIAGNOSIS — R052 Subacute cough: Secondary | ICD-10-CM | POA: Insufficient documentation

## 2024-01-08 NOTE — Assessment & Plan Note (Signed)
 Persistent dry cough since last visit. Differential diagnosis includes reflux and sinus drainage. Current management includes cetirizine  for allergies. Discussed potential role of reflux in cough. - Start Flonase  nasal spray. - Add famotidine at night for reflux. - Advise obtaining Delsym cough syrup over the counter. - Reassess if cough persists after treatment.

## 2024-01-08 NOTE — Assessment & Plan Note (Addendum)
 Chronic, fair control. Goal BP<120/80. She will c/w amlodipine  10mg , metoprolol  daily and Edarbi  80mg  daily.  She is encouraged to follow low sodium diet, make necessary dietary changes and increase daily activity. I will see her in four to six months.

## 2024-01-08 NOTE — Assessment & Plan Note (Signed)
 Diabetes management with Farxiga  and Tresiba . Blood sugar levels discussed, with a noted high reading of 140 mg/dL in the morning. Dietary influences on blood sugar levels discussed, particularly processed meats. - Request records from Dr. Tommas to review A1c results. - Encourage dietary modifications to reduce processed meat intake.

## 2024-01-08 NOTE — Assessment & Plan Note (Signed)
 Intermittent muscle cramps, possibly related to electrolyte imbalance from excessive water  intake. Discussed potential need for multivitamin with minerals. Exercise and stretching habits reviewed. - Check magnesium  levels. - Consider multivitamin with minerals. - Encourage stretching exercises to improve circulation.

## 2024-01-08 NOTE — Assessment & Plan Note (Signed)
 Chronic,LDL goal is less than 55.   - Continue with rosuvastatin  40mg  daily.  - Follow heart healthy lifestyle.

## 2024-01-08 NOTE — Assessment & Plan Note (Signed)
 Calcifications in coronary arteries with previous discussion about potential stent placement to alleviate exercise-induced fatigue and shortness of breath.  LDL goal is less than 70, optimal is less than 55.  - Continue with ASA 81mg  daily, carvedilol  3.125mg  twice daily and rosuvastatin  20mg  daily.  - Discuss shortness of breath and fatigue with cardiologist. - Ensure follow-up with cardiologist regarding potential stent placement.

## 2024-01-09 ENCOUNTER — Ambulatory Visit: Payer: Self-pay | Admitting: Internal Medicine

## 2024-01-09 LAB — CMP14+EGFR
ALT: 13 IU/L (ref 0–32)
AST: 17 IU/L (ref 0–40)
Albumin: 3.7 g/dL — ABNORMAL LOW (ref 3.9–4.9)
Alkaline Phosphatase: 110 IU/L (ref 49–135)
BUN/Creatinine Ratio: 14 (ref 12–28)
BUN: 18 mg/dL (ref 8–27)
Bilirubin Total: 0.3 mg/dL (ref 0.0–1.2)
CO2: 21 mmol/L (ref 20–29)
Calcium: 9.4 mg/dL (ref 8.7–10.3)
Chloride: 103 mmol/L (ref 96–106)
Creatinine, Ser: 1.3 mg/dL — ABNORMAL HIGH (ref 0.57–1.00)
Globulin, Total: 2.9 g/dL (ref 1.5–4.5)
Glucose: 147 mg/dL — ABNORMAL HIGH (ref 70–99)
Potassium: 5.1 mmol/L (ref 3.5–5.2)
Sodium: 138 mmol/L (ref 134–144)
Total Protein: 6.6 g/dL (ref 6.0–8.5)
eGFR: 44 mL/min/1.73 — ABNORMAL LOW (ref >59)

## 2024-01-09 LAB — PROTEIN ELECTROPHORESIS, SERUM
A/G Ratio: 1 (ref 0.7–1.7)
Albumin ELP: 3.3 g/dL (ref 2.9–4.4)
Alpha 1: 0.2 g/dL (ref 0.0–0.4)
Alpha 2: 0.8 g/dL (ref 0.4–1.0)
Beta: 1.1 g/dL (ref 0.7–1.3)
Gamma Globulin: 1.2 g/dL (ref 0.4–1.8)
Globulin, Total: 3.3 g/dL (ref 2.2–3.9)

## 2024-01-09 LAB — LIPOPROTEIN A (LPA): Lipoprotein (a): 194.9 nmol/L — ABNORMAL HIGH (ref <75.0)

## 2024-01-09 LAB — TSH: TSH: 2.52 u[IU]/mL (ref 0.450–4.500)

## 2024-01-09 LAB — HEMOGLOBIN A1C
Est. average glucose Bld gHb Est-mCnc: 220 mg/dL
Hgb A1c MFr Bld: 9.3 % — ABNORMAL HIGH (ref 4.8–5.6)

## 2024-01-09 LAB — LIPID PANEL
Chol/HDL Ratio: 2.6 ratio (ref 0.0–4.4)
Cholesterol, Total: 257 mg/dL — ABNORMAL HIGH (ref 100–199)
HDL: 98 mg/dL (ref >39)
LDL Chol Calc (NIH): 143 mg/dL — ABNORMAL HIGH (ref 0–99)
Triglycerides: 95 mg/dL (ref 0–149)
VLDL Cholesterol Cal: 16 mg/dL (ref 5–40)

## 2024-01-09 LAB — CK: Total CK: 135 U/L (ref 32–182)

## 2024-01-09 LAB — MAGNESIUM: Magnesium: 2.3 mg/dL (ref 1.6–2.3)

## 2024-01-10 ENCOUNTER — Telehealth: Payer: Self-pay | Admitting: Pharmacist

## 2024-01-10 DIAGNOSIS — E1122 Type 2 diabetes mellitus with diabetic chronic kidney disease: Secondary | ICD-10-CM

## 2024-01-10 NOTE — Progress Notes (Signed)
   01/10/2024  Patient ID: Leslie Davis Chuck, female   DOB: Aug 27, 1955, 68 y.o.   MRN: [id number]  Patient was called to follow up after her most recent labs. Her HgA1c increased to 9.3%. Unfortunately, she did not answer her phone and a HIPAA compliant message could not be left on her voicemail because her mailbox was full.    Lab Results  Component Value Date   HGBA1C 9.3 (H) 01/05/2024   HGBA1C 8.3 (H) 07/06/2023   HGBA1C 10.4 (H) 04/07/2023    On Farxiga  10 mg #30 filled 12/19/23 Tresiba  28 units at bedtime Humalog  0-6 units three times daily with meals   Lipid Panel     Component Value Date/Time   CHOL 257 (H) 01/05/2024 0904   TRIG 95 01/05/2024 0904   HDL 98 01/05/2024 0904   CHOLHDL 2.6 01/05/2024 0904   CHOLHDL 2.8 09/13/2007 0405   VLDL 17 09/13/2007 0405   LDLCALC 143 (H) 01/05/2024 0904   LABVLDL 16 01/05/2024 0904   Rosuvastatin  had not been filled since 07/25--refills sent pre request at the 01/05/24 Provider appointment.  01/05/24 #90 filled Ezetimibe  10 mg #90 filled   Plan: Call Patient back in 7-10 business days. Insulin  therapy needs to be titrated. Ozempic  was on her medication list a few years ago. Not sure why stopped-Leslie discuss with Patient.   Cassius DOROTHA Brought, PharmD, BCACP Clinical Pharmacist [phone]

## 2024-01-11 ENCOUNTER — Ambulatory Visit (HOSPITAL_COMMUNITY)
Admission: RE | Admit: 2024-01-11 | Discharge: 2024-01-11 | Disposition: A | Discharge status: Home / Self Care | Source: Ambulatory Visit | Attending: Internal Medicine | Admitting: Internal Medicine

## 2024-01-11 DIAGNOSIS — I739 Peripheral vascular disease, unspecified: Secondary | ICD-10-CM | POA: Diagnosis not present

## 2024-01-11 LAB — VAS US LOWER EXT ART SEG MULTI (SEGMENTALS & LE RAYNAUDS)
Left ABI: 0.99
Right ABI: 1.1

## 2024-01-13 ENCOUNTER — Telehealth: Payer: Self-pay | Admitting: Cardiology

## 2024-01-13 DIAGNOSIS — E7800 Pure hypercholesterolemia, unspecified: Secondary | ICD-10-CM

## 2024-01-13 MED ORDER — ROSUVASTATIN CALCIUM 40 MG PO TABS
40.0000 mg | ORAL_TABLET | Freq: Every day | ORAL | 2 refills | Status: AC
Start: 2024-01-13 — End: ?

## 2024-01-13 NOTE — Telephone Encounter (Signed)
*  STAT* If patient is at the pharmacy, call can be transferred to refill team.   1. Which medications need to be refilled? (please list name of each medication and dose if known)   rosuvastatin  (CRESTOR ) 40 MG tablet   2. Would you like to learn more about the convenience, safety, & potential cost savings by using the [facility]?   3. Are you open to using the [facility] (Type [facility]. ).  4. Which pharmacy/location (including street and city if local pharmacy) is medication to be sent to?  [facility] - [place]  - [place], [place] - [address]   5. Do they need a 30 day or 90 day supply?   30 day  Caller stated patient is completely out of this medication.

## 2024-01-13 NOTE — Telephone Encounter (Signed)
 RX sent in

## 2024-01-16 ENCOUNTER — Telehealth: Payer: Self-pay | Admitting: Pharmacist

## 2024-01-16 ENCOUNTER — Ambulatory Visit: Payer: Self-pay

## 2024-01-16 DIAGNOSIS — E1122 Type 2 diabetes mellitus with diabetic chronic kidney disease: Secondary | ICD-10-CM

## 2024-01-16 NOTE — Telephone Encounter (Signed)
    Copied from CRM [id number]. Topic: Clinical - Red Word Triage >> Jan 16, 2024  2:11 PM Joesph B wrote: Red Word that prompted transfer to Nurse Triage: Patient had surgery last year and now shes feeling sick, (same symptoms before her stomach surgery) she's in pain. Patient had a mass on her stomach. Reason for Disposition  Nausea lasts > 1 week  Answer Assessment - Initial Assessment Questions Has already talked to surgeon, encouraged to f/u with pcp  1. LOCATION: Where does it hurt?      Middle of stomach 2. RADIATION: Does the pain shoot anywhere else? (e.g., chest, back)     denies 3. ONSET: When did the pain begin? (e.g., minutes, hours or days ago)      Patient has been having sick spells for about two weeks experiences nausea without vomiting 4. SUDDEN: Gradual or sudden onset?     Gradual onset 5. PATTERN Does the pain come and go, or is it constant?     Comes and goes 6. SEVERITY: How bad is the pain?  (e.g., Scale 1-10; mild, moderate, or severe)     Rarely painful 7. RECURRENT SYMPTOM: Have you ever had this type of stomach pain before? If Yes, ask: When was the last time? and What happened that time?      Similar symptoms to when she had mass removed May 2024 8. CAUSE: What do you think is causing the stomach pain? (e.g., gallstones, recent abdominal surgery)     unsure 9. RELIEVING/AGGRAVATING FACTORS: What makes it better or worse? (e.g., antacids, bending or twisting motion, bowel movement)     denies 10. OTHER SYMPTOMS: Do you have any other symptoms? (e.g., back pain, diarrhea, fever, urination pain, vomiting)       nausea  Answer Assessment - Initial Assessment Questions 1. NAUSEA SEVERITY: How bad is the nausea? (e.g., mild, moderate, severe; dehydration, weight loss)     Moderate,  Patient has been having sick spells for about two weeks experiences nausea without vomiting 2. ONSET: When did the nausea begin?     Two weeks  ago 3. VOMITING: Any vomiting? If Yes, ask: How many times today?     denies 4. RECURRENT SYMPTOM: Have you had nausea before? If Yes, ask: When was the last time? What happened that time?     Similar feeling compared to when she had surgery to remove mass 07/2022 5. CAUSE: What do you think is causing the nausea?     unsure   Has already talked to surgeon, encouraged to f/u with pcp  Protocols used: Abdominal Pain - Female-A-AH, Nausea-A-AH

## 2024-01-16 NOTE — Progress Notes (Signed)
   01/16/2024  Patient ID: Leslie Davis, female   DOB: March 02, 1956, 68 y.o.   MRN: [id number]  Patient was called regarding diabetes management per referral.  Unfortunately, she did not answer the phone. HIPAA compliant message could not be left on her voicemail because her mailbox was full.     Lab Results  Component Value Date   HGBA1C 9.3 (H) 01/05/2024   HGBA1C 8.3 (H) 07/06/2023   HGBA1C 10.4 (H) 04/07/2023    Lipid Panel     Component Value Date/Time   CHOL 257 (H) 01/05/2024 0904   TRIG 95 01/05/2024 0904   HDL 98 01/05/2024 0904   CHOLHDL 2.6 01/05/2024 0904   CHOLHDL 2.8 09/13/2007 0405   VLDL 17 09/13/2007 0405   LDLCALC 143 (H) 01/05/2024 0904   LABVLDL 16 01/05/2024 0904    Unfortunately, I have been unable to reach this Patient despite multiple calls. Leslie send her over to Jill Simcox, CPhT to reach out to. If she does not answer, Her Pharmacy case Leslie be closed.  Cassius DOROTHA Brought, PharmD, BCACP Clinical Pharmacist [phone]

## 2024-01-18 ENCOUNTER — Ambulatory Visit: Admitting: Family Medicine

## 2024-01-18 ENCOUNTER — Encounter: Payer: Self-pay | Admitting: Family Medicine

## 2024-01-18 VITALS — BP 122/80 | HR 54 | Temp 97.9°F | Ht 64.0 in | Wt 223.0 lb

## 2024-01-18 DIAGNOSIS — R1011 Right upper quadrant pain: Secondary | ICD-10-CM | POA: Diagnosis not present

## 2024-01-18 DIAGNOSIS — R110 Nausea: Secondary | ICD-10-CM | POA: Diagnosis not present

## 2024-01-18 MED ORDER — ONDANSETRON 4 MG PO TBDP: 4.0000 mg | ORAL_TABLET | Freq: Four times a day (QID) | ORAL | 0 refills | Status: AC | PRN

## 2024-01-18 NOTE — Progress Notes (Signed)
 I,Jameka J Llittleton, CMA,acting as a neurosurgeon for [employer], NP.,have documented all relevant documentation on the behalf of [name], NP,as directed by  [name], NP while in the presence of [name], NP.  Subjective:  Patient ID: Leslie Davis , female    DOB: November 10, 1955 , 68 y.o.   MRN: [id number]  Chief Complaint  Patient presents with   Abdominal Pain    Patient presents today for abdominal pain. She reports the episodes have been recurrent for the past 3 weeks. When she wakes up in the morning and throughout the day her stomach Leslie hurt. Patient reports she is also nauseous. Patient is unaware of what is triggering her stomach to hurt.      HPI Discussed the use of AI scribe software for clinical note transcription with the patient, who gave verbal consent to proceed.  History of Present Illness Leslie Davis is a 68 year old female who presents with abdominal pain and nausea.  She has been experiencing sharp, intermittent abdominal pain primarily in the right upper quadrant for the past two weeks. The pain is generally mild, rated at 2 out of 10, but can escalate to 7 out of 10 during episodes. Each episode lasts a few minutes before subsiding. There are no specific food triggers identified.  Nausea has been present for the past three weeks, accompanying the abdominal pain. She has been using previously prescribed nausea medication, but it has not been effective.  She underwent surgery in May of the previous year for a non-cancerous mesentery mass. She has a history of constipation and has not had a bowel movement for the past two days, despite taking medication for constipation as needed. She is currently on medication for acid reflux, which she finds effective.  She has an upcoming appointment with her GI doctor on December 2nd but is seeking earlier evaluation due to her symptoms.  Constipation for the past two days. No significant change in bowel  habits prior to this episode.   Leslie Davis is a 67 year old female who presents with abdominal pain and nausea.  She has been experiencing sharp, intermittent abdominal pain primarily in the right upper quadrant for the past two weeks. The pain is generally mild, rated at 2 out of 10, but can escalate to 7 out of 10 during episodes. Each episode lasts a few minutes before subsiding. There are no specific food triggers identified.  Nausea has been present for the past three weeks, accompanying the abdominal pain. She has been using previously prescribed nausea medication, but it has not been effective.  She underwent surgery in May of the previous year for a non-cancerous mesentery mass. She has a history of constipation and has not had a bowel movement for the past two days, despite taking medication for constipation as needed. She is currently on medication for acid reflux, which she finds effective.  She has an upcoming appointment with her GI doctor on December 2nd but is seeking earlier evaluation due to her symptoms.  Constipation for the past two days. No significant change in bowel habits prior to this episode.         Past Medical History:  Diagnosis Date   CKD (chronic kidney disease)    stage 3   Coronary artery disease 07/2021   obstructive CAD-70% LAD-treated medically   DM (diabetes mellitus) (HCC)    Fatty liver disease, nonalcoholic    Gastritis    Gastroparesis  GERD (gastroesophageal reflux disease)    Heartburn    Hiatal hernia    HLD (hyperlipidemia)    HTN (hypertension)    Joint pain    Renal calculus    Rheumatoid arthritis (HCC)    Vitamin D  deficiency      Family History  Problem Relation Age of Onset   Diabetes Mother    Thyroid  disease Mother    Kidney disease Mother    Hypertension Father    Hyperlipidemia Father    Sudden death Father    Stroke Father    Diabetes Sister    Heart disease Sister    Kidney disease Sister     COPD Sister    Heart disease Brother 84   Diabetes Brother    Stomach cancer Maternal Aunt        X 2 aunts   Diabetes Maternal Grandmother    Colon cancer Neg Hx    Esophageal cancer Neg Hx    Rectal cancer Neg Hx    Colon polyps Neg Hx      Current Outpatient Medications:    Accu-Chek FastClix Lancets MISC, USE AS NEEDED TO CHECK BLOOD SUGARS., Disp: 100 each, Rfl: 2   acetaminophen  (TYLENOL ) 500 MG tablet, Take 1 tablet (500 mg total) by mouth every 6 (six) hours as needed for mild pain., Disp: 30 tablet, Rfl: 0   albuterol  (VENTOLIN  HFA) 108 (90 Base) MCG/ACT inhaler, Inhale 2 puffs into the lungs every 6 (six) hours as needed for wheezing or shortness of breath., Disp: 17 g, Rfl: 2   amLODipine  (NORVASC ) 10 MG tablet, TAKE 1 TABLET BY MOUTH ONCE DAILY, Disp: 330 tablet, Rfl: 11   ASPIRIN  LOW DOSE 81 MG tablet, TAKE 1 TABLET BY MOUTH ONCE DAILY *SWALLOW WHOLE*, Disp: 30 tablet, Rfl: 10   Blood Glucose Monitoring Suppl (ONETOUCH VERIO) w/Device KIT, Use as directed to check blood sugars 2 times per day dx: e11.65, Disp: 1 kit, Rfl: 1   celecoxib  (CELEBREX ) 200 MG capsule, Take 1 capsule (200 mg total) by mouth 2 (two) times daily as needed., Disp: 180 capsule, Rfl: 2   cetirizine  (ALLERGY RELIEF CETIRIZINE ) 10 MG tablet, TAKE 1 TABLET BY MOUTH ONCE DAILY, Disp: 90 tablet, Rfl: 1   clindamycin (CLEOCIN T) 1 % lotion, Apply 1 Application topically daily as needed (acne)., Disp: , Rfl:    Continuous Glucose Receiver (FREESTYLE LIBRE 3 READER) DEVI, Use to check glucose continuously, Disp: 1 each, Rfl: 0   Continuous Glucose Sensor (FREESTYLE LIBRE 3 PLUS SENSOR) MISC, Change sensor every 15 days., Disp: 6 each, Rfl: 1   EDARBI  80 MG TABS, TAKE 1 TABLET BY MOUTH ONCE DAILY, Disp: 30 tablet, Rfl: 10   ezetimibe  (ZETIA ) 10 MG tablet, TAKE 1 TABLET BY MOUTH ONCE DAILY., Disp: 90 tablet, Rfl: 3   FARXIGA  10 MG TABS tablet, TAKE 1 TABLET BY MOUTH DAILY BEFORE BREAKFAST, Disp: 30 tablet, Rfl:  10   ferrous gluconate  (FERGON) 324 MG tablet, TAKE 1 TABLET BY MOUTH TWICE DAILY WITH A MEAL., Disp: 100 tablet, Rfl: 0   fluticasone  (FLONASE ) 50 MCG/ACT nasal spray, Place 2 sprays into both nostrils daily., Disp: 16 g, Rfl: 6   glucose blood (ACCU-CHEK GUIDE TEST) test strip, USE ONCE DAILY TO CHECK BLOOD SUGAR., Disp: 100 each, Rfl: 12   glucose blood (ONETOUCH VERIO) test strip, USE TO TEST BLOOD SUGAR TWICE DAILY AS DIRECTED, Disp: 100 each, Rfl: 10   insulin  lispro (HUMALOG ) 100 UNIT/ML KwikPen, Inject 0-6 Units  into the skin 3 (three) times daily with meals. Inject up to 3 times daily with meals per sliding scale (70-150: 2 units, 150-199: 3 units, 200-249: 4 units, 250-299: 5 units, 300-349: 6 units), Disp: 15 mL, Rfl: 3   Insulin  Pen Needle (PEN NEEDLES) 31G X 5 MM MISC, 1 each by Does not apply route 3 (three) times daily. May dispense any manufacturer covered by patient's insurance., Disp: 100 each, Rfl: 0   linaclotide  ([name] ) 145 MCG CAPS capsule, Take 1 capsule (145 mcg total) by mouth daily., Disp: 30 capsule, Rfl: 0   methylPREDNISolone  (MEDROL  DOSEPAK) 4 MG TBPK tablet, Take as directed., Disp: 21 tablet, Rfl: 0   metoprolol  succinate (TOPROL  XL) 25 MG 24 hr tablet, Take 0.5 tablets (12.5 mg total) by mouth daily., Disp: 30 tablet, Rfl: 3   nitroGLYCERIN  (NITROSTAT ) 0.4 MG SL tablet, Place 1 tablet (0.4 mg total) under the tongue every 5 (five) minutes as needed for chest pain. If you require more than two tablets five minutes apart go to the nearest ER via EMS., Disp: 30 tablet, Rfl: 0   ondansetron  (ZOFRAN ) 4 MG tablet, TAKE 1 TABLET BY MOUTH EVERY 8 HOURS AS NEEDED FOR NAUSEA & VOMITING, Disp: 20 tablet, Rfl: 10   ondansetron  (ZOFRAN -ODT) 4 MG disintegrating tablet, Take 1 tablet (4 mg total) by mouth every 6 (six) hours as needed for nausea or vomiting., Disp: 20 tablet, Rfl: 0   OneTouch Delica Lancets 33G MISC, Use as directed to check blood sugars 2 times per day dx:e11.65,  Disp: 150 each, Rfl: 3   pantoprazole  (PROTONIX ) 40 MG tablet, TAKE ONE (1) TABLET BY MOUTH TWICE DAILY, Disp: 60 tablet, Rfl: 11   polyethylene glycol (MIRALAX  / GLYCOLAX ) 17 g packet, Take 17 g by mouth daily as needed for mild constipation., Disp: 14 each, Rfl: 0   rosuvastatin  (CRESTOR ) 40 MG tablet, Take 1 tablet (40 mg total) by mouth daily., Disp: 90 tablet, Rfl: 2   temazepam  (RESTORIL ) 30 MG capsule, TAKE 1 CAPSULE BY MOUTH AT BEDTIME AS NEEDED *REFILL REQUEST*, Disp: 30 capsule, Rfl: 0   topiramate  (TOPAMAX ) 25 MG tablet, 1/2 tab bid, Disp: 30 tablet, Rfl: 1   traMADol  (ULTRAM ) 50 MG tablet, Take 1 tablet (50 mg total) by mouth 2 (two) times daily as needed., Disp: 30 tablet, Rfl: 0   TRESIBA  FLEXTOUCH 100 UNIT/ML FlexTouch Pen, Inject 24 units daily. Max daily dose 30 units., Disp: 15 mL, Rfl: 1   TRUEPLUS PEN NEEDLES 31G X 6 MM MISC, USE WITH pen TO INJECT insulin  DAILY, Disp: 150 each, Rfl: 3   Allergies  Allergen Reactions   Fiasp  [Insulin  Aspart (W-Niacinamide)] Hives     Review of Systems  Constitutional: Negative.   Respiratory: Negative.    Gastrointestinal:  Positive for abdominal pain and nausea.  Genitourinary: Negative.   Neurological: Negative.   Psychiatric/Behavioral: Negative.       Today's Vitals   01/18/24 0933  BP: 122/80  Pulse: (!) 54  Temp: 97.9 F (36.6 C)  TempSrc: Oral  Weight: 223 lb (101.2 kg)  Height: 5' 4 (1.626 m)  PainSc: 2   PainLoc: Abdomen   Body mass index is 38.28 kg/m.  Wt Readings from Last 3 Encounters:  01/18/24 223 lb (101.2 kg)  01/05/24 226 lb 9.6 oz (102.8 kg)  01/04/24 228 lb (103.4 kg)    The 10-year ASCVD risk score (Arnett DK, et al., 2019) is: 31%   Values used to calculate the score:  Age: 3 years     Clincally relevant sex: Female     Is Non-Hispanic African American: Yes     Diabetic: Yes     Tobacco smoker: No     Systolic Blood Pressure: 122 mmHg     Is BP treated: Yes     HDL Cholesterol: 98  mg/dL     Total Cholesterol: 257 mg/dL  Objective:  Physical Exam HENT:     Head: Normocephalic.  Abdominal:     Tenderness: There is abdominal tenderness.  Neurological:     Mental Status: She is alert and oriented to person, place, and time.         Assessment And Plan:  Nausea -     Ondansetron ; Take 1 tablet (4 mg total) by mouth every 6 (six) hours as needed for nausea or vomiting.  Dispense: 20 tablet; Refill: 0 -     US  ABDOMEN LIMITED RUQ (LIVER/GB); Future  Right upper quadrant abdominal pain -     US  ABDOMEN LIMITED RUQ (LIVER/GB); Future    Return if symptoms worsen or fail to improve.  Patient was given opportunity to ask questions. Patient verbalized understanding of the plan and was able to repeat key elements of the plan. All questions were answered to their satisfaction.    I, [name], NP, have reviewed all documentation for this visit. The documentation on 01/23/2024 for the exam, diagnosis, procedures, and orders are all accurate and complete.    IF YOU HAVE BEEN REFERRED TO A SPECIALIST, IT MAY TAKE 1-2 WEEKS TO SCHEDULE/PROCESS THE REFERRAL. IF YOU HAVE NOT HEARD FROM US /SPECIALIST IN TWO WEEKS, PLEASE GIVE US  A CALL AT [phone] X [phone].

## 2024-01-20 ENCOUNTER — Other Ambulatory Visit

## 2024-01-20 ENCOUNTER — Telehealth: Payer: Self-pay | Admitting: Pharmacy Technician

## 2024-01-20 ENCOUNTER — Ambulatory Visit
Admission: RE | Admit: 2024-01-20 | Discharge: 2024-01-20 | Disposition: A | Discharge status: Home / Self Care | Source: Ambulatory Visit | Attending: Family Medicine | Admitting: Family Medicine

## 2024-01-20 DIAGNOSIS — R1011 Right upper quadrant pain: Secondary | ICD-10-CM | POA: Diagnosis not present

## 2024-01-20 DIAGNOSIS — R110 Nausea: Secondary | ICD-10-CM

## 2024-01-20 NOTE — Progress Notes (Signed)
   01/20/2024  Patient ID: Leslie Davis Chuck, female   DOB: 02/17/1956, 68 y.o.   MRN: [id number]  Patient engaged with clinical pharmacist for management of diabetes on 11/29/2023. Outreach by [employer] technician was requested.   Outreached patient to discuss diabetes medication management. Patient answered the call but informs this is not a good time as she is driving to work. Inquired about best day and time to call and patient informs to call back Monday 01/23/2024 at 10am.  Keslyn Teater, CPhT [facility] Office: [phone] Email: [email or web address]

## 2024-01-23 ENCOUNTER — Telehealth: Payer: Self-pay | Admitting: Pharmacy Technician

## 2024-01-23 ENCOUNTER — Ambulatory Visit: Payer: Self-pay | Admitting: Family Medicine

## 2024-01-23 NOTE — Progress Notes (Signed)
 Abdominal ultrasound did not show any acute abnormality. Fatty liver noted. Lifestyle modifications such as diet and exercise advised.  Thanks!

## 2024-01-23 NOTE — Progress Notes (Signed)
   01/23/2024 Name: GENOWEFA MORGA MRN: [id number] DOB: 09/27/1955  Patient is appearing for a follow-up visit with the population health pharmacy technician. Last engaged with the clinical pharmacist to discuss diabetes on 11/29/2023. Contacted patient today to discuss diabetes.   Plan from last clinical pharmacist appointment:  Diabetes: - Currently uncontrolled; goal A1c <7%. Cardiorenal risk reduction is opportunities for improvement.. Blood pressure is at goal <130/80. LDL is at goal.  - Follow up after upcoming appointment with labs. -Follow Up Plan:   Follow up after upcoming appointment. Send note to PCP about 90 day supplies with Chronic meds and Rosuvastatin  not being filled since 10/19/23(copy/paste from last note)   Medication Adherence Barriers Identified:  Patient made recommended medication changes per plan: No Patient informs she is NOT taking Metoprolol  XL 12.5mg  daily (this was to replace Carvedilol ). She informs she is taking the following medications. She reports she has them on hand except as noted above and that they are affordable for her. She takings Humalog  prn when blood sugar is high. She uses Tresiba  at 28 units at bedtime. She takes the following daily by mouth medications: Farxiga  10mg , Amlodipine  10mg , Edarbi  80mg , Ezetimbe 10mg , Rosuvastatin  40mg . Access issues with any new medication or testing device: No Per Dr Annemarie fill history data, Humalog  filled on 8/18 for 46ml/83 day supply, Tresiba  on 9/29 for 36ml/50 day supply, Farxiga  on 10/28 for 30, Amlodipine  on 10/28 for 30, Edarbi  on 10/28 for 30, Ezetimbe on 10/16 for 90, Rosuvastatin  on 10/16 for 100. Metoprolol  XL 25mg  (which patient states she is NOT taking) was last filled on 8/29 for 30 tablets/60 day supply. Also FreeStyle sensors last filled for 2 on 10/18 to last 30 days. Patient informs she last saw Endo in June and is due for an appointment in December. She informs she will call and make the follow up  appointment for Dec or Jan. A1C on 01/05/2024 was 9.3  Patient is checking blood sugars as prescribed: Yes Uses FreeStyle Date of Download: 01/23/2024 She reports readings of 165 (12am), 182 (6am), 260 (12pm), 263 (6pm) Time in Goal:  - Time in range 70-180: 41% - Time above range: 59% - Time below range: 0%  Patient is checking blood pressure readings as prescribed: Yes She is checking it once in a while. She informs she last check it last week on Friday. Current blood pressure readings: 121/80 on 01/20/24.   Medication Adherence Barriers Addressed/Actions Taken:  Reviewed medication changes per plan from last clinical pharmacist note Will discuss medication   concerns with pharmacist Patient is NOT taking Metoprolol  XL 25mg  1/2 tablet daily. Educated patient to contact pharmacy regarding new prescriptions  Reviewed instructions for monitoring blood sugars at home and reminded patient to keep a written log to review with pharmacist Reminded patient of date/time of upcoming clinical pharmacist follow up and any upcoming PCP/specialists visits. Patient denies transportation barriers to the appointment. Yes  Next clinical pharmacist appointment is scheduled for: TBD  Kate Caddy, CPhT [facility] Office: [phone] Email: [email or web address]

## 2024-01-24 ENCOUNTER — Telehealth: Payer: Self-pay | Admitting: Pharmacist

## 2024-01-24 NOTE — Progress Notes (Signed)
   01/24/2024  Patient ID: Leslie Davis, female   DOB: 1955-04-03, 68 y.o.   MRN: [id number]  Called Patient to follow up on metoprolol  XL.  She reported to Jill Simcox, CPhT that is was not taking it although it is still on her medication list.  HIPAA compliant message could not be left on her voicemail because it was full.  Lab Results  Component Value Date   HGBA1C 9.3 (H) 01/05/2024   HGBA1C 8.3 (H) 07/06/2023   HGBA1C 10.4 (H) 04/07/2023    Patient is on Farxiga  10 mg,Humalog  0-6 units with meals and Tresiba  28  units daily  Her A1c increased 1%.  Insulin  dose  most likely needs to be titrated.  On statin therapy:  Rosuvastatin  40mg  #100 filled 01/05/24-SUPD gap closed   Plan: Call Patient back in 2 weeks.  Cassius DOROTHA Brought, PharmD, BCACP Clinical Pharmacist [phone]

## 2024-01-25 ENCOUNTER — Other Ambulatory Visit: Payer: Self-pay | Admitting: Internal Medicine

## 2024-01-25 DIAGNOSIS — K2970 Gastritis, unspecified, without bleeding: Secondary | ICD-10-CM

## 2024-02-08 ENCOUNTER — Telehealth: Payer: Self-pay | Admitting: Pharmacist

## 2024-02-08 DIAGNOSIS — I130 Hypertensive heart and chronic kidney disease with heart failure and stage 1 through stage 4 chronic kidney disease, or unspecified chronic kidney disease: Secondary | ICD-10-CM

## 2024-02-08 NOTE — Progress Notes (Signed)
   02/08/2024  Patient ID: Leslie Davis Chuck, female   DOB: [birth year], 68 y.o.   MRN: [id number]  Patient was called regarding metoprolol  vs carvedilol . Unfortuantely, She did not answer her phone. HIPAA compliant message was left on her voice mail.  I received a message from Jill Simcox, CPhT stating that she talked to the patient and she said metoprolol  was supposed to take the place of carvedilol .  Dr. Everlyn note from 01/05/24 said the Patient was to continue with Carvedilol  3.25mg  1 tablet twice daily. Note from Dr. Michele from 06/25 said the patient experience fatigue on metoprolol .     01/18/2024    9:33 AM 01/05/2024    8:32 AM 01/04/2024   12:17 PM  Vitals with BMI  Height 5' 4 5' 4   Weight 223 lbs 226 lbs 10 oz   BMI 38.26 38.88   Systolic 122 122 859  Diastolic 80 84 70  Pulse 54 59    Pulse under 60 bpm last two visits Plan: Route note to Dr. Jarold. Call patient back in 1 week.   Cassius DOROTHA Brought, PharmD, BCACP Clinical Pharmacist [phone]

## 2024-02-14 ENCOUNTER — Telehealth: Payer: Self-pay | Admitting: Pharmacist

## 2024-02-14 ENCOUNTER — Telehealth (HOSPITAL_BASED_OUTPATIENT_CLINIC_OR_DEPARTMENT_OTHER): Payer: Self-pay

## 2024-02-14 DIAGNOSIS — I152 Hypertension secondary to endocrine disorders: Secondary | ICD-10-CM

## 2024-02-14 NOTE — Progress Notes (Signed)
   02/14/2024  Patient ID: Leslie Davis Chuck, female   DOB: 08-Jan-1956, 68 y.o.   MRN: [id number]  Called Patient to discuss carvedilol  versus metoprolol .  Unfortunately, she did not answer the phone. HIPAA compliant message was left on her voicemail.     Patient was called regarding metoprolol  vs carvedilol . Unfortuantely, She did not answer her phone. HIPAA compliant message was left on her voice mail. I received a message from Jill Simcox, CPhT stating that she talked to the patient and she said metoprolol  was supposed to take the place of carvedilol . Dr. Everlyn note from 01/05/24 said the Patient was to continue with Carvedilol  3.25mg  1 tablet twice daily. Note from Dr. Michele from 06/25 said the patient experienced fatigue on metoprolol .      01/18/2024    9:33 AM 01/05/2024    8:32 AM 01/04/2024   12:17 PM  Vitals with BMI  Height 5' 4 5' 4   Weight 223 lbs 226 lbs 10 oz   BMI 38.26 38.88   Systolic 122 122 859  Diastolic 80 84 70  Pulse 54 59    Patient has a surgery scheduled for 02/21/24 Her next follow up with Dr. Jarold is 05/10/2023.  Plan: Route note to Dr. Jarold. Call patient back next week after her surgery.   Cassius DOROTHA Brought, PharmD, BCACP Clinical Pharmacist [phone]

## 2024-02-14 NOTE — Telephone Encounter (Signed)
   Name: Leslie Davis  DOB: Dec 10, 1955  MRN: [id number]  Primary Cardiologist: Madonna Large, DO   Preoperative team, please contact this patient and set up a phone call appointment for further preoperative risk assessment. Please obtain consent and complete medication review. Thank you for your help.  I confirm that guidance regarding antiplatelet and oral anticoagulation therapy has been completed and, if necessary, noted below.   Regarding ASA therapy, we recommend continuation of ASA throughout the perioperative period.  However, if the surgeon feels that cessation of ASA is required in the perioperative period, it may be stopped 5-7 days prior to surgery with a plan to resume it as soon as felt to be feasible from a surgical standpoint in the post-operative period.  I also confirmed the patient resides in the state of [place] . As per [facility] Medical Board telemedicine laws, the patient must reside in the state in which the provider is licensed.   Leslie JAYSON Braver, NP 02/14/2024, 4:03 PM [facility]

## 2024-02-14 NOTE — Telephone Encounter (Signed)
   Pre-operative Risk Assessment    Patient Name: Leslie Davis  DOB: 1956/01/30 MRN: [id number]   Date of last office visit: 07/14/2023 with Dr. Michele Date of next office visit: None  Request for Surgical Clearance    Procedure:  Left carpal tunnel release. Left middle finger A1 pulley release with manipulation under anesthesia  Date of Surgery:  Clearance TBD                                 Surgeon:  Dr. Elsie Mussel Surgeon's Group or Practice Name:  [facility] Phone number:  [phone] Fax number:  [phone] or [phone] -Joen Sic   Type of Clearance Requested:   - Medical  - Pharmacy:  [facility]  -Does not specify   Type of Anesthesia:  Does not specify   Additional requests/questions:  None  SignedPatrcia Iverson CROME   02/14/2024, 3:56 PM

## 2024-02-20 LAB — OPHTHALMOLOGY REPORT-SCANNED

## 2024-03-08 ENCOUNTER — Telehealth: Payer: Self-pay | Admitting: Cardiology

## 2024-03-08 NOTE — Telephone Encounter (Signed)
 Went over patients meds prescribed by Dr Michele. Pt states she needs refill on the Toprol . Medication sent to preferred pharmacy.

## 2024-03-08 NOTE — Telephone Encounter (Signed)
 Pt lost bottle of medication of  last prescription prescribed and does not know the name. Please advise.

## 2024-03-19 ENCOUNTER — Other Ambulatory Visit: Payer: Self-pay | Admitting: Internal Medicine

## 2024-04-02 ENCOUNTER — Other Ambulatory Visit: Payer: Self-pay | Admitting: Internal Medicine

## 2024-04-02 DIAGNOSIS — Z1231 Encounter for screening mammogram for malignant neoplasm of breast: Secondary | ICD-10-CM

## 2024-04-04 NOTE — Progress Notes (Signed)
 ULA COUVILLON                                          MRN: [id number]   04/04/2024   The [facility] Quality Team Specialist reviewed this patient medical record for the purposes of chart review for care gap closure. The following were reviewed: chart review for care gap closure-glycemic status assessment.    [facility]BCI Quality Team

## 2024-04-06 ENCOUNTER — Telehealth (HOSPITAL_BASED_OUTPATIENT_CLINIC_OR_DEPARTMENT_OTHER): Payer: Self-pay

## 2024-04-06 NOTE — Telephone Encounter (Signed)
 Patient is scheduled to see Dr. Michele on 04/16/2024. Pre-op evaluation can be completed at that time. I will route pre-op clearance form to Dr. Michele and add pre-op evaluation to appointment notes so that he is aware.  Will remove from pre-op pool.  Annely Sliva E Danzel Marszalek, PA-C 04/06/2024 2:23 PM

## 2024-04-06 NOTE — Telephone Encounter (Signed)
"  ° °  Pre-operative Risk Assessment    Patient Name: Leslie Davis  DOB: Aug 28, 1955 MRN: [id number]   Date of last office visit: 10/13/23 with Madonna Large MD Date of next office visit: none   Request for Surgical Clearance    Procedure:  Left Carpal Tunnel Release LT middle finger  Date of Surgery:  Clearance TBD                                Surgeon:  Dr. Elsie Lineman Surgeon's Group or Practice Name:  [facility] Phone number:  [phone] Fax number:  [phone]   Type of Clearance Requested:   - Medical    Type of Anesthesia:  A1 Pulley release with manipulation    Additional requests/questions:    Leslie Davis   04/06/2024, 12:27 PM   "

## 2024-04-10 ENCOUNTER — Ambulatory Visit: Admitting: Cardiology

## 2024-04-13 ENCOUNTER — Other Ambulatory Visit (HOSPITAL_COMMUNITY): Payer: Self-pay

## 2024-04-13 ENCOUNTER — Encounter: Payer: Self-pay | Admitting: Cardiology

## 2024-04-13 ENCOUNTER — Ambulatory Visit: Attending: Cardiology | Admitting: Cardiology

## 2024-04-13 VITALS — BP 134/56 | HR 54 | Resp 16 | Ht 64.0 in | Wt 223.9 lb

## 2024-04-13 DIAGNOSIS — Z794 Long term (current) use of insulin: Secondary | ICD-10-CM | POA: Diagnosis not present

## 2024-04-13 DIAGNOSIS — I700 Atherosclerosis of aorta: Secondary | ICD-10-CM

## 2024-04-13 DIAGNOSIS — R9431 Abnormal electrocardiogram [ECG] [EKG]: Secondary | ICD-10-CM

## 2024-04-13 DIAGNOSIS — E1122 Type 2 diabetes mellitus with diabetic chronic kidney disease: Secondary | ICD-10-CM

## 2024-04-13 DIAGNOSIS — Z01810 Encounter for preprocedural cardiovascular examination: Secondary | ICD-10-CM

## 2024-04-13 DIAGNOSIS — I10 Essential (primary) hypertension: Secondary | ICD-10-CM | POA: Diagnosis not present

## 2024-04-13 DIAGNOSIS — I2510 Atherosclerotic heart disease of native coronary artery without angina pectoris: Secondary | ICD-10-CM

## 2024-04-13 DIAGNOSIS — E7841 Elevated Lipoprotein(a): Secondary | ICD-10-CM | POA: Diagnosis not present

## 2024-04-13 DIAGNOSIS — N1831 Chronic kidney disease, stage 3a: Secondary | ICD-10-CM

## 2024-04-13 DIAGNOSIS — E7800 Pure hypercholesterolemia, unspecified: Secondary | ICD-10-CM | POA: Diagnosis not present

## 2024-04-13 NOTE — Progress Notes (Signed)
 " Cardiology Office Note:  .   Date:  04/13/2024  ID:  Leslie Davis, DOB [birth year], MRN [id number] PCP:  Jarold Medici, MD  Former Cardiology Providers: Dr. [facility] Providers Cardiologist:  Madonna Large, DO , [facility] (established care 07/09/2021) Electrophysiologist:  None  Click to update primary MD,subspecialty MD or APP then REFRESH:1}    Chief Complaint  Patient presents with   Coronary atherosclerosis due to calcified coronary lesion   Pre-op Exam    History of Present Illness: .   Leslie Davis is a 69 y.o. African-American female whose past medical history and cardiovascular risk factors includes: CAD, Coronary artery calcification, aortic atherosclerosis, hypertension, hyperlipidemia, GERD, fatty liver disease, DM Type II, nephrolithiasis, Removal of mesenteric mass, pathology notes neuroendocrine tumor, advanced age.   Referred to the practice back in 2023 for evaluation of shortness of breath and tightness.  Underwent ischemic workup which noted disease in the LAD distribution.  She underwent left heart catheterization which illustrated similar findings.  However with uptitration of antianginal therapy she was asymptomatic and the shared decision was to treat her medically.  During last office visit in July 2025 patient was asymptomatic she was doing aerobic exercise twice a week for 45 minutes without any exertional symptoms.  She also reported having her pulse being in the mid 40s high 50s though asymptomatic.  For precautionary purposes carvedilol  was discontinued due to concerns for bradycardia and a cardiac monitor was considered.  Cardiac monitor reported an average heart rate of 61 bpm, no pauses or conduction disease.  Patient presents today for follow-up and also for preoperative risk assessment for upcoming left carpal tunnel release and left middle finger.  Date of surgery is to be determined.  Since last office visit Leslie Davis denies any  anginal chest pain or heart failure symptoms.   No hospitalizations or urgent care visits for cardiovascular reasons.   She has been compliant with her medical therapy and endorses no concerns.  Physical endurance remains stable -aerobics twice a week for one hr. Home SBP ranges between 128-140 mmHg  Review of Systems: .   Review of Systems  Cardiovascular:  Negative for chest pain, claudication, irregular heartbeat, leg swelling, near-syncope, orthopnea, palpitations, paroxysmal nocturnal dyspnea and syncope.  Respiratory:  Negative for shortness of breath.   Hematologic/Lymphatic: Negative for bleeding problem.    Studies Reviewed:   EKG: EKG Interpretation Date/Time:  Friday April 13 2024 09:24:38 EST Ventricular Rate:  48 PR Interval:  142 QRS Duration:  80 QT Interval:  458 QTC Calculation: 409 R Axis:   44  Text Interpretation: Sinus bradycardia T wave abnormality Lateral leads When compared with ECG of 13-Oct-2023 08:18, Nonspecific T wave abnormality has replaced inverted T waves in Inferior leads Nonspecific T wave abnormality has replaced inverted T waves in Anterior leads Confirmed by Large Madonna [phone]) on 04/13/2024 9:35:31 AM  Echocardiogram: 07/15/2021:  Normal LV systolic function with EF 63%. Left ventricle cavity is normal  in size. Mild concentric remodeling of the left ventricle. Normal global  wall motion. Normal diastolic filling pattern. Calculated EF 63%.  Left atrial cavity is mildly dilated by volume.  Structurally normal tricuspid valve.  Mild tricuspid regurgitation. No  evidence of pulmonary hypertension.   Stress Testing: 11/03/2018 [facility] available in Care Everywhere: Per report: No reversible ischemia or infarction, normal wall motion, calculated LVEF 84%, low risk study   CCTA: 07/22/2021 1. Total coronary calcium  score of 341.  This was 93rd percentile for age and sex matched control. 2. Normal  coronary origin with co-dominance. 3. CAD-RADS = 3.   Left Main: Patent.   LAD: Mild stenosis (25-49%) at the proximal LAD due to calcified plaque. Moderate stenosis (50-69%) at mid LAD after the take off of second diagonal branch due to mixed eccentric plaque (lesion is about 2 cm in length). Mild stenosis (25-49%) due to calcified plaque within the distal and apical LAD. First Diagonal branch is patent. Second diagonal branch patent with calcified plaque at the mid-segment.   LCx: Patent.   RCA: Moderate stenosis (50-69%) at the ostial RCA due to eccentric mixed plaque and remainder of the vessel is patent.   4. Aortic atherosclerosis.   5. Study is sent for CT-FFR to further evaluate the RCA and LAD disease. Findings will be performed and reported separately.   6. No acute or significant incidental extracardiac findings in the chest.   CTFFR  May 2023: CT FFR analysis illustrates hemodynamically significant stenosis within the mid LAD (distal to second diagonal branch).   Heart Catheterization: 08/18/2021: LM: Normal LAD: Prox calcific 30% disease         Mid, focal, eccentric 70% stenosis Lcx: No significant disease RCA: Ostial 40% stenosis Patient opted medical therapy. In absence of any angina or angina equivalent symptoms on excellent medical therapy, reasonable to continue the same at that time.  In future, if symptoms increase, could consider PCI to mid LAD.  RADIOLOGY: CTA PE protocol: June 25, 2021 1. No pulmonary embolus or acute intrathoracic abnormality. 2. Coronary artery calcifications. Aortic Atherosclerosis (ICD10-I70.0)  Risk Assessment/Calculations:   The 10-year ASCVD risk score (Arnett DK, et al., 2019) is: 36%   Values used to calculate the score:     Age: 69 years     Clinically relevant sex: Female     Is Non-Hispanic African American: Yes     Diabetic: Yes     Tobacco smoker: No     Systolic Blood Pressure: 134 mmHg     Is BP treated: Yes      HDL Cholesterol: 98 mg/dL     Total Cholesterol: 257 mg/dL    Labs:       Latest Ref Rng & Units 07/06/2023   11:24 AM 05/08/2023    9:23 PM 01/04/2023   10:31 AM  CBC  WBC 3.4 - 10.8 x10E3/uL 4.6  4.7  4.8   Hemoglobin 11.1 - 15.9 g/dL 86.0  87.8  86.6   Hematocrit 34.0 - 46.6 % 41.9  38.1  41.4   Platelets 150 - 450 x10E3/uL 248  193  237        Latest Ref Rng & Units 01/05/2024    9:04 AM 07/06/2023   11:24 AM 05/08/2023    9:23 PM  BMP  Glucose 70 - 99 mg/dL 852  822  764   BUN 8 - 27 mg/dL 18  27  25    Creatinine 0.57 - 1.00 mg/dL 8.69  8.54  8.93   BUN/Creat Ratio 12 - 28 14  19     Sodium 134 - 144 mmol/L 138  139  134   Potassium 3.5 - 5.2 mmol/L 5.1  5.2  4.0   Chloride 96 - 106 mmol/L 103  102  108   CO2 20 - 29 mmol/L 21  21  20    Calcium  8.7 - 10.3 mg/dL 9.4  9.7  8.2       Latest Ref Rng & Units  01/05/2024    9:04 AM 07/06/2023   11:24 AM 05/08/2023    9:23 PM  CMP  Glucose 70 - 99 mg/dL 852  822  764   BUN 8 - 27 mg/dL 18  27  25    Creatinine 0.57 - 1.00 mg/dL 8.69  8.54  8.93   Sodium 134 - 144 mmol/L 138  139  134   Potassium 3.5 - 5.2 mmol/L 5.1  5.2  4.0   Chloride 96 - 106 mmol/L 103  102  108   CO2 20 - 29 mmol/L 21  21  20    Calcium  8.7 - 10.3 mg/dL 9.4  9.7  8.2   Total Protein 6.0 - 8.5 g/dL 6.6  7.0  6.8   Total Bilirubin 0.0 - 1.2 mg/dL 0.3  0.3  0.5   Alkaline Phos 49 - 135 IU/L 110  131  75   AST 0 - 40 IU/L 17  18  16    ALT 0 - 32 IU/L 13  17  15      Lab Results  Component Value Date   CHOL 257 (H) 01/05/2024   HDL 98 01/05/2024   LDLCALC 143 (H) 01/05/2024   TRIG 95 01/05/2024   CHOLHDL 2.6 01/05/2024   Recent Labs    01/05/24 0904  LIPOA 194.9*   No components found for: NTPROBNP No results for input(s): PROBNP in the last 8760 hours. Recent Labs    01/05/24 0904  TSH 2.520    Physical Exam:    Today's Vitals   04/13/24 0922  BP: (!) 134/56  Pulse: (!) 54  Resp: 16  SpO2: 95%  Weight: 223 lb 14.4 oz (101.6  kg)  Height: 5' 4 (1.626 m)   Body mass index is 38.43 kg/m. Wt Readings from Last 3 Encounters:  04/13/24 223 lb 14.4 oz (101.6 kg)  01/18/24 223 lb (101.2 kg)  01/05/24 226 lb 9.6 oz (102.8 kg)    Physical Exam  Neck: No JVD present.  Cardiovascular: Normal rate, regular rhythm, S1 normal, S2 normal, intact distal pulses and normal pulses. Exam reveals no gallop, no S3 and no S4.  No murmur heard. Pulses:      Dorsalis pedis pulses are 2+ on the right side and 2+ on the left side.       Posterior tibial pulses are 2+ on the right side and 2+ on the left side.  Pulmonary/Chest: Effort normal and breath sounds normal. No stridor. She has no wheezes. She has no rales. She exhibits no tenderness.  Abdominal: Soft. Bowel sounds are normal. She exhibits no distension. There is no abdominal tenderness.  Musculoskeletal:        General: No edema.     Impression & Recommendation(s):  Impression:   ICD-10-CM   1. Atherosclerosis of native coronary artery of native heart without angina pectoris  I25.10 EKG 12-Lead    ECHOCARDIOGRAM COMPLETE    CT CORONARY MORPH W/CTA COR W/SCORE W/CA W/CM &/OR WO/CM    2. Atherosclerosis of aorta  I70.0     3. Elevated Lp(a)  E78.41     4. Abnormal EKG  R94.31 ECHOCARDIOGRAM COMPLETE    CT CORONARY MORPH W/CTA COR W/SCORE W/CA W/CM &/OR WO/CM    5. Preop cardiovascular exam  Z01.810 ECHOCARDIOGRAM COMPLETE    CT CORONARY MORPH W/CTA COR W/SCORE W/CA W/CM &/OR WO/CM    6. Benign hypertension  I10     7. Type 2 diabetes mellitus with stage 3a chronic kidney disease,  with long-term current use of insulin  (HCC)  E11.22    N18.31    Z79.4     8. Pure hypercholesterolemia  E78.00        Recommendation(s):  Atherosclerosis of native coronary artery of native heart without angina pectoris Atherosclerosis of aorta Elevated Lp(a) LAD disease has been treated with medical therapy since May 2023. Total CAC 341, 93rd percentile Denies anginal  chest pain -with regular activities.  Functional capacity limited due to knee pain EKG today illustrates sinus bradycardia with diffuse T wave abnormality in the inferior, anterior, and lateral leads During her prior catheterization patient was noted to have disease in the LAD but conservative management was recommended as she was asymptomatic with uptitration of antianginal therapy. LDL 143 mg/dL as of October 2025 despite being on maximally tolerated doses of Crestor  and Zetia  Elevated LP(a) levels Referred to Pharm.D. for PCSK9 initiation Reemphasized importance of glycemic control Echo will be ordered to evaluate for structural heart disease and left ventricular systolic function. Given the fact that she performs less than 4 levels of met activity, upcoming noncardiac surgery, abnormal EKG, estimated 10-year risk of ASCVD greater than 30%, known LAD disease, and uncontrolled diabetes/hyperlipidemia and elevated LP(a) levels recommend further testing prior to providing risk stratification.   Coronary CTA to evaluate for CAC, plaque burden, and obstructive disease  Preop cardiovascular exam Patient is being considered for left carpal tunnel release and left middle finger trigger release surgery Patient informs me that this is an elective surgery Date of surgery is to be determined Testing as mentioned above Recommendations forthcoming  Benign hypertension Office blood pressures are acceptable. Continue Edarbi  80 mg p.o. daily. Continue amlodipine  10 mg p.o. daily. Continue Farxiga  10 mg p.o. daily. Continue Toprol -XL 12.5 mg p.o. daily  Type 2 diabetes mellitus with stage 3a chronic kidney disease, with long-term current use of insulin  (HCC) Hemoglobin A1c 9.3% as of October 2025 Reemphasized importance of glycemic control Reemphasized the importance of lipid management, goal LDL at least less than 70 mg/dL and if possible close to 55 given the elevated LP(a). Currently on Farxiga ,  ARB, statin  Pure hypercholesterolemia LDL level remains uncontrolled despite maximally tolerated dose of high intensity statin and Zetia  Will refer to Pharm.D. clinic for PCSK9 initiation  Orders Placed:  Orders Placed This Encounter  Procedures   CT CORONARY MORPH W/CTA COR W/SCORE W/CA W/CM &/OR WO/CM    Standing Status:   Future    Expected Date:   04/27/2024    Expiration Date:   04/13/2025    If indicated for the ordered procedure, I authorize the administration of contrast media per Radiology protocol:   Yes    Does the patient have a contrast media/X-ray dye allergy?:   No    [facility]    Authorization::   Per Dr. Michele for CAD   EKG 12-Lead   ECHOCARDIOGRAM COMPLETE    Standing Status:   Future    Expected Date:   05/14/2024    Expiration Date:   04/13/2025    Where should this test [facility] performed[facility]    Does the patient weigh less than or greater than 250 lbs?:   Patient weighs less than 250 lbs    Perflutren DEFINITY (image enhancing agent) should be administered unless hypersensitivity or allergy exist:   Administer Perflutren    Reason for exam-Echo:   CAD Native Vessel  I25.10  Final Medication List:   No orders of the defined types were placed in this encounter.   Medications Discontinued During This Encounter  Medication Reason   methylPREDNISolone  (MEDROL  DOSEPAK) 4 MG TBPK tablet Completed Course   topiramate  (TOPAMAX ) 25 MG tablet Patient Preference     Current Outpatient Medications:    Accu-Chek FastClix Lancets MISC, USE AS NEEDED TO CHECK BLOOD SUGARS., Disp: 100 each, Rfl: 2   acetaminophen  (TYLENOL ) 500 MG tablet, Take 1 tablet (500 mg total) by mouth every 6 (six) hours as needed for mild pain., Disp: 30 tablet, Rfl: 0   albuterol  (VENTOLIN  HFA) 108 (90 Base) MCG/ACT inhaler, Inhale 2 puffs into the lungs every 6 (six) hours as needed for wheezing or shortness of breath., Disp: 17 g, Rfl:  2   amLODipine  (NORVASC ) 10 MG tablet, TAKE 1 TABLET BY MOUTH ONCE DAILY, Disp: 330 tablet, Rfl: 11   aspirin  EC (ASPIRIN  LOW DOSE) 81 MG tablet, TAKE 1 TABLET BY MOUTH ONCE DAILY *SWALLOW WHOLE*, Disp: 90 tablet, Rfl: 2   Blood Glucose Monitoring Suppl (ONETOUCH VERIO) w/Device KIT, Use as directed to check blood sugars 2 times per day dx: e11.65, Disp: 1 kit, Rfl: 1   celecoxib  (CELEBREX ) 200 MG capsule, Take 1 capsule (200 mg total) by mouth 2 (two) times daily as needed., Disp: 180 capsule, Rfl: 2   cetirizine  (ALLERGY RELIEF CETIRIZINE ) 10 MG tablet, TAKE 1 TABLET BY MOUTH ONCE DAILY, Disp: 90 tablet, Rfl: 1   clindamycin (CLEOCIN T) 1 % lotion, Apply 1 Application topically daily as needed (acne)., Disp: , Rfl:    Continuous Glucose Receiver (FREESTYLE LIBRE 3 READER) DEVI, Use to check glucose continuously, Disp: 1 each, Rfl: 0   Continuous Glucose Sensor (FREESTYLE LIBRE 3 PLUS SENSOR) MISC, Change sensor every 15 days., Disp: 6 each, Rfl: 1   EDARBI  80 MG TABS, TAKE 1 TABLET BY MOUTH ONCE DAILY, Disp: 30 tablet, Rfl: 10   ezetimibe  (ZETIA ) 10 MG tablet, TAKE 1 TABLET BY MOUTH ONCE DAILY., Disp: 90 tablet, Rfl: 3   FARXIGA  10 MG TABS tablet, TAKE 1 TABLET BY MOUTH DAILY BEFORE BREAKFAST, Disp: 30 tablet, Rfl: 10   ferrous gluconate  (FERGON) 324 MG tablet, TAKE 1 TABLET BY MOUTH TWICE DAILY WITH A MEAL., Disp: 100 tablet, Rfl: 0   fluticasone  (FLONASE ) 50 MCG/ACT nasal spray, Place 2 sprays into both nostrils daily., Disp: 16 g, Rfl: 6   glucose blood (ACCU-CHEK GUIDE TEST) test strip, USE ONCE DAILY TO CHECK BLOOD SUGAR., Disp: 100 each, Rfl: 12   glucose blood (ONETOUCH VERIO) test strip, USE TO TEST BLOOD SUGAR TWICE DAILY AS DIRECTED, Disp: 100 each, Rfl: 10   insulin  lispro (HUMALOG ) 100 UNIT/ML KwikPen, Inject 0-6 Units into the skin 3 (three) times daily with meals. Inject up to 3 times daily with meals per sliding scale (70-150: 2 units, 150-199: 3 units, 200-249: 4 units, 250-299: 5  units, 300-349: 6 units), Disp: 15 mL, Rfl: 3   Insulin  Pen Needle (PEN NEEDLES) 31G X 5 MM MISC, 1 each by Does not apply route 3 (three) times daily. May dispense any manufacturer covered by patient's insurance., Disp: 100 each, Rfl: 0   linaclotide  ([name] ) 145 MCG CAPS capsule, Take 1 capsule (145 mcg total) by mouth daily., Disp: 30 capsule, Rfl: 0   metoprolol  succinate (TOPROL  XL) 25 MG 24 hr tablet, Take 0.5 tablets (12.5 mg total) by mouth daily., Disp: 45 tablet, Rfl: 2   nitroGLYCERIN  (NITROSTAT ) 0.4 MG SL tablet,  Place 1 tablet (0.4 mg total) under the tongue every 5 (five) minutes as needed for chest pain. If you require more than two tablets five minutes apart go to the nearest ER via EMS., Disp: 30 tablet, Rfl: 0   ondansetron  (ZOFRAN ) 4 MG tablet, TAKE 1 TABLET BY MOUTH EVERY 8 HOURS AS NEEDED FOR NAUSEA & VOMITING, Disp: 20 tablet, Rfl: 10   ondansetron  (ZOFRAN -ODT) 4 MG disintegrating tablet, Take 1 tablet (4 mg total) by mouth every 6 (six) hours as needed for nausea or vomiting., Disp: 20 tablet, Rfl: 0   OneTouch Delica Lancets 33G MISC, Use as directed to check blood sugars 2 times per day dx:e11.65, Disp: 150 each, Rfl: 3   pantoprazole  (PROTONIX ) 40 MG tablet, TAKE ONE (1) TABLET BY MOUTH TWICE DAILY, Disp: 60 tablet, Rfl: 11   polyethylene glycol (MIRALAX  / GLYCOLAX ) 17 g packet, Take 17 g by mouth daily as needed for mild constipation., Disp: 14 each, Rfl: 0   rosuvastatin  (CRESTOR ) 40 MG tablet, Take 1 tablet (40 mg total) by mouth daily., Disp: 90 tablet, Rfl: 2   temazepam  (RESTORIL ) 30 MG capsule, TAKE 1 CAPSULE BY MOUTH AT BEDTIME AS NEEDED *REFILL REQUEST*, Disp: 30 capsule, Rfl: 0   traMADol  (ULTRAM ) 50 MG tablet, Take 1 tablet (50 mg total) by mouth 2 (two) times daily as needed., Disp: 30 tablet, Rfl: 0   TRESIBA  FLEXTOUCH 100 UNIT/ML FlexTouch Pen, Inject 24 units daily. Max daily dose 30 units., Disp: 15 mL, Rfl: 1   TRUEPLUS PEN NEEDLES 31G X 6 MM MISC, USE WITH  pen TO INJECT insulin  DAILY, Disp: 150 each, Rfl: 3  Consent:   NA  Disposition:   4 to 6 weeks post testing  Her questions and concerns were addressed to her satisfaction. She voices understanding of the recommendations provided during this encounter.    Signed, Madonna Michele HAS, [facility]  A Division of [facility] [address] [place], [place] [address]  [place], [place] [address] 04/13/2024 12:10 PM "

## 2024-04-13 NOTE — Patient Instructions (Addendum)
 Medication Instructions:  Your physician recommends that you continue on your current medications as directed. Please refer to the Current Medication list given to you today.  *If you need a refill on your cardiac medications before your next appointment, please call your pharmacy*  Lab Work: None ordered If you have labs (blood work) drawn today and your tests are completely normal, you will receive your results only by: MyChart Message (if you have MyChart) OR A paper copy in the mail If you have any lab test that is abnormal or we need to change your treatment, we will call you to review the results.  Testing/Procedures: Echocardiogram  Coronary CT Angiography   Follow-Up: At [facility], you and your health needs are our priority.  As part of our continuing mission to provide you with exceptional heart care, our providers are all part of one team.  This team includes your primary Cardiologist (physician) and Advanced Practice Providers or APPs (Physician Assistants and Nurse Practitioners) who all work together to provide you with the care you need, when you need it.  Your next appointment:   4 week(s)  Provider:   Madonna Large, DO    We recommend signing up for the patient portal called MyChart.  Sign up information is provided on this After Visit Summary.  MyChart is used to connect with patients for Virtual Visits (Telemedicine).  Patients are able to view lab/test results, encounter notes, upcoming appointments, etc.  Non-urgent messages can be sent to your provider as well.   To learn more about what you can do with MyChart, go to [email or web address].   Other Instructions We are referring you to our Pharm-D department for initiation of PCSK9 Inhibitor  ECHOCARDIOGRAM  Your physician has requested that you have an echocardiogram. Echocardiography is a painless test that uses sound waves to create images of your heart. It provides your doctor with information  about the size and shape of your heart and how well your hearts chambers and valves are working. This procedure takes approximately one hour. There are no restrictions for this procedure. Please do NOT wear cologne, perfume, aftershave, or lotions (deodorant is allowed). Please arrive 15 minutes prior to your appointment time.  Please note: We ask at that you not bring children with you during ultrasound (echo/ vascular) testing. Due to room size and safety concerns, children are not allowed in the ultrasound rooms during exams. Our front office staff cannot provide observation of children in our lobby area while testing is being conducted. An adult accompanying a patient to their appointment will only be allowed in the ultrasound room at the discretion of the ultrasound technician under special circumstances. We apologize for any inconvenience.   CORONARY CT ANGIOGRAPHY    Your cardiac CT will be scheduled at   [facility] [address]  [place], [place] [address]  At the Heart and [facility] at [address], please enter the parking lot using the [address] entrance and use the FREE valet service at the patient drop-off area. Enter the building and check-in with registration on the main floor.  Please follow these instructions carefully (unless otherwise directed):  An IV will be required for this test and Nitroglycerin  will be given.   On the Night Before the Test: Be sure to Drink plenty of water . Do not consume any caffeinated/decaffeinated beverages or chocolate 12 hours prior to your test. Do not take any antihistamines 12 hours prior to your test.  On the Day of the Test: Drink plenty of water  until 1 hour prior to the test. Do not eat any food 1 hour prior to test. You may take your regular medications prior to the test.  HOLD Edarbi  on the morning of the test. Patients who wear a continuous glucose monitor MUST remove the device  prior to scanning. FEMALES- please wear underwire-free bra if available, avoid dresses & tight clothing  After the Test: Drink plenty of water . After receiving IV contrast, you may experience a mild flushed feeling. This is normal. On occasion, you may experience a mild rash up to 24 hours after the test. This is not dangerous. If this occurs, you can take Benadryl  25 mg, Zyrtec , Claritin , or Allegra and increase your fluid intake. (Patients taking Tikosyn should avoid Benadryl , and may take Zyrtec , Claritin , or Allegra) If you experience trouble breathing, this can be serious. If it is severe call 911 IMMEDIATELY. If it is mild, please call our office.  We will call to schedule your test 2-4 weeks out understanding that some insurance companies will need an authorization prior to the service being performed.   For more information and frequently asked questions, please visit our website : [email or web address]  For non-scheduling related questions, please contact the cardiac imaging nurse navigator should you have any questions/concerns: Cardiac Imaging Nurse Navigators Direct Office Dial : [phone]   For scheduling needs, including cancellations and rescheduling, please call Brittany, [phone].

## 2024-04-16 ENCOUNTER — Ambulatory Visit: Admitting: Cardiology

## 2024-04-17 ENCOUNTER — Ambulatory Visit (HOSPITAL_COMMUNITY)

## 2024-04-19 ENCOUNTER — Ambulatory Visit
Admission: RE | Admit: 2024-04-19 | Discharge: 2024-04-19 | Disposition: A | Discharge status: Home / Self Care | Source: Ambulatory Visit | Attending: Internal Medicine | Admitting: Internal Medicine

## 2024-04-19 DIAGNOSIS — Z1231 Encounter for screening mammogram for malignant neoplasm of breast: Secondary | ICD-10-CM

## 2024-04-26 ENCOUNTER — Ambulatory Visit (HOSPITAL_COMMUNITY)
Admission: RE | Admit: 2024-04-26 | Discharge: 2024-04-26 | Disposition: A | Source: Ambulatory Visit | Attending: Cardiovascular Disease | Admitting: Cardiovascular Disease

## 2024-04-26 ENCOUNTER — Ambulatory Visit (HOSPITAL_COMMUNITY)
Admission: RE | Admit: 2024-04-26 | Discharge: 2024-04-26 | Disposition: A | Source: Ambulatory Visit | Attending: Cardiology | Admitting: Cardiology

## 2024-04-26 ENCOUNTER — Other Ambulatory Visit: Payer: Self-pay | Admitting: Cardiology

## 2024-04-26 DIAGNOSIS — Z01810 Encounter for preprocedural cardiovascular examination: Secondary | ICD-10-CM

## 2024-04-26 DIAGNOSIS — R931 Abnormal findings on diagnostic imaging of heart and coronary circulation: Secondary | ICD-10-CM

## 2024-04-26 DIAGNOSIS — R9431 Abnormal electrocardiogram [ECG] [EKG]: Secondary | ICD-10-CM

## 2024-04-26 DIAGNOSIS — I2510 Atherosclerotic heart disease of native coronary artery without angina pectoris: Secondary | ICD-10-CM

## 2024-04-26 MED ORDER — IOHEXOL 350 MG/ML SOLN
100.0000 mL | Freq: Once | INTRAVENOUS | Status: AC | PRN
  Administered 2024-04-26: 100 mL via INTRAVENOUS

## 2024-04-26 MED ORDER — NITROGLYCERIN 0.4 MG SL SUBL
0.8000 mg | SUBLINGUAL_TABLET | Freq: Once | SUBLINGUAL | Status: AC
  Administered 2024-04-26: 0.8 mg via SUBLINGUAL

## 2024-04-26 NOTE — Progress Notes (Signed)
[place]

## 2024-04-27 ENCOUNTER — Ambulatory Visit: Payer: Self-pay | Admitting: Physician Assistant

## 2024-05-09 ENCOUNTER — Ambulatory Visit: Payer: Self-pay | Admitting: Internal Medicine

## 2024-05-10 ENCOUNTER — Ambulatory Visit (HOSPITAL_COMMUNITY)

## 2024-05-14 ENCOUNTER — Ambulatory Visit: Admitting: Cardiology

## 2024-07-10 ENCOUNTER — Encounter: Payer: Self-pay | Admitting: Internal Medicine
